# Patient Record
Sex: Female | Born: 1945
Health system: Southern US, Community
[De-identification: ages and names within clinical notes are randomized; demographics above are authoritative.]

## PROBLEM LIST (undated history)

## (undated) DIAGNOSIS — N390 Urinary tract infection, site not specified: Secondary | ICD-10-CM

## (undated) DIAGNOSIS — N281 Cyst of kidney, acquired: Secondary | ICD-10-CM

## (undated) DIAGNOSIS — M545 Low back pain, unspecified: Secondary | ICD-10-CM

## (undated) DIAGNOSIS — Z9889 Other specified postprocedural states: Secondary | ICD-10-CM

## (undated) DIAGNOSIS — I509 Heart failure, unspecified: Secondary | ICD-10-CM

## (undated) DIAGNOSIS — M1990 Unspecified osteoarthritis, unspecified site: Secondary | ICD-10-CM

## (undated) DIAGNOSIS — K859 Acute pancreatitis without necrosis or infection, unspecified: Secondary | ICD-10-CM

## (undated) DIAGNOSIS — E669 Obesity, unspecified: Secondary | ICD-10-CM

## (undated) DIAGNOSIS — I10 Essential (primary) hypertension: Secondary | ICD-10-CM

## (undated) DIAGNOSIS — N183 Chronic kidney disease, stage 3 unspecified: Secondary | ICD-10-CM

## (undated) DIAGNOSIS — R112 Nausea with vomiting, unspecified: Secondary | ICD-10-CM

## (undated) DIAGNOSIS — D638 Anemia in other chronic diseases classified elsewhere: Secondary | ICD-10-CM

## (undated) DIAGNOSIS — G629 Polyneuropathy, unspecified: Secondary | ICD-10-CM

## (undated) DIAGNOSIS — I3139 Other pericardial effusion (noninflammatory): Secondary | ICD-10-CM

## (undated) DIAGNOSIS — I272 Pulmonary hypertension, unspecified: Secondary | ICD-10-CM

## (undated) DIAGNOSIS — I313 Pericardial effusion (noninflammatory): Secondary | ICD-10-CM

## (undated) DIAGNOSIS — D126 Benign neoplasm of colon, unspecified: Secondary | ICD-10-CM

## (undated) DIAGNOSIS — I359 Nonrheumatic aortic valve disorder, unspecified: Secondary | ICD-10-CM

## (undated) DIAGNOSIS — K5730 Diverticulosis of large intestine without perforation or abscess without bleeding: Secondary | ICD-10-CM

## (undated) DIAGNOSIS — I872 Venous insufficiency (chronic) (peripheral): Secondary | ICD-10-CM

## (undated) HISTORY — PX: ABDOMINAL HYSTERECTOMY: SHX81

## (undated) HISTORY — DX: Polyneuropathy, unspecified: G62.9

## (undated) HISTORY — PX: COLONOSCOPY W/ BIOPSIES AND POLYPECTOMY: SHX1376

## (undated) HISTORY — DX: Low back pain: M54.5

## (undated) HISTORY — DX: Cyst of kidney, acquired: N28.1

## (undated) HISTORY — DX: Low back pain, unspecified: M54.50

## (undated) HISTORY — DX: Venous insufficiency (chronic) (peripheral): I87.2

## (undated) HISTORY — DX: Diverticulosis of large intestine without perforation or abscess without bleeding: K57.30

## (undated) HISTORY — DX: Nonrheumatic aortic valve disorder, unspecified: I35.9

## (undated) HISTORY — DX: Unspecified osteoarthritis, unspecified site: M19.90

## (undated) HISTORY — DX: Benign neoplasm of colon, unspecified: D12.6

## (undated) HISTORY — DX: Obesity, unspecified: E66.9

## (undated) HISTORY — DX: Pulmonary hypertension, unspecified: I27.20

## (undated) HISTORY — DX: Essential (primary) hypertension: I10

---

## 1998-06-06 ENCOUNTER — Ambulatory Visit (HOSPITAL_COMMUNITY): Admission: RE | Admit: 1998-06-06 | Discharge: 1998-06-06 | Payer: Self-pay | Admitting: Pulmonary Disease

## 1998-06-06 ENCOUNTER — Encounter: Payer: Self-pay | Admitting: Pulmonary Disease

## 1998-06-27 ENCOUNTER — Ambulatory Visit (HOSPITAL_COMMUNITY): Admission: RE | Admit: 1998-06-27 | Discharge: 1998-06-27 | Payer: Self-pay | Admitting: *Deleted

## 1999-02-20 ENCOUNTER — Ambulatory Visit (HOSPITAL_COMMUNITY): Admission: RE | Admit: 1999-02-20 | Discharge: 1999-02-20 | Payer: Self-pay | Admitting: *Deleted

## 2002-01-06 DIAGNOSIS — D126 Benign neoplasm of colon, unspecified: Secondary | ICD-10-CM

## 2002-01-06 HISTORY — DX: Benign neoplasm of colon, unspecified: D12.6

## 2002-03-16 ENCOUNTER — Other Ambulatory Visit: Admission: RE | Admit: 2002-03-16 | Discharge: 2002-03-16 | Payer: Self-pay | Admitting: Obstetrics & Gynecology

## 2003-09-06 ENCOUNTER — Other Ambulatory Visit: Admission: RE | Admit: 2003-09-06 | Discharge: 2003-09-06 | Payer: Self-pay | Admitting: Obstetrics & Gynecology

## 2004-02-07 ENCOUNTER — Ambulatory Visit: Payer: Self-pay | Admitting: Pulmonary Disease

## 2004-10-30 ENCOUNTER — Ambulatory Visit: Payer: Self-pay | Admitting: Pulmonary Disease

## 2004-11-06 ENCOUNTER — Ambulatory Visit: Payer: Self-pay | Admitting: Pulmonary Disease

## 2004-11-12 ENCOUNTER — Ambulatory Visit: Payer: Self-pay

## 2005-02-04 ENCOUNTER — Other Ambulatory Visit: Admission: RE | Admit: 2005-02-04 | Discharge: 2005-02-04 | Payer: Self-pay | Admitting: Obstetrics & Gynecology

## 2005-07-05 ENCOUNTER — Ambulatory Visit: Payer: Self-pay | Admitting: Pulmonary Disease

## 2005-07-09 ENCOUNTER — Ambulatory Visit: Payer: Self-pay | Admitting: Pulmonary Disease

## 2005-07-23 ENCOUNTER — Ambulatory Visit: Payer: Self-pay | Admitting: Pulmonary Disease

## 2005-08-27 ENCOUNTER — Ambulatory Visit: Payer: Self-pay | Admitting: Pulmonary Disease

## 2005-10-22 ENCOUNTER — Ambulatory Visit: Payer: Self-pay | Admitting: Pulmonary Disease

## 2006-01-28 ENCOUNTER — Ambulatory Visit: Payer: Self-pay | Admitting: Pulmonary Disease

## 2006-11-27 ENCOUNTER — Encounter: Admission: RE | Admit: 2006-11-27 | Discharge: 2006-11-27 | Payer: Self-pay | Admitting: Obstetrics & Gynecology

## 2006-12-16 ENCOUNTER — Ambulatory Visit: Payer: Self-pay | Admitting: Pulmonary Disease

## 2006-12-16 LAB — CONVERTED CEMR LAB
ALT: 25 units/L (ref 0–35)
AST: 25 units/L (ref 0–37)
Albumin: 3.5 g/dL (ref 3.5–5.2)
Alkaline Phosphatase: 90 units/L (ref 39–117)
BUN: 15 mg/dL (ref 6–23)
Basophils Absolute: 0.1 10*3/uL (ref 0.0–0.1)
Basophils Relative: 0.7 % (ref 0.0–1.0)
Bilirubin, Direct: 0.1 mg/dL (ref 0.0–0.3)
CO2: 34 meq/L — ABNORMAL HIGH (ref 19–32)
Calcium: 9.8 mg/dL (ref 8.4–10.5)
Chloride: 103 meq/L (ref 96–112)
Cholesterol: 160 mg/dL (ref 0–200)
Creatinine, Ser: 1 mg/dL (ref 0.4–1.2)
Eosinophils Absolute: 0.1 10*3/uL (ref 0.0–0.6)
Eosinophils Relative: 1.5 % (ref 0.0–5.0)
GFR calc Af Amer: 72 mL/min
GFR calc non Af Amer: 60 mL/min
Glucose, Bld: 92 mg/dL (ref 70–99)
HCT: 34.2 % — ABNORMAL LOW (ref 36.0–46.0)
HDL: 49.8 mg/dL (ref >39.0)
Hemoglobin: 11.8 g/dL — ABNORMAL LOW (ref 12.0–15.0)
LDL Cholesterol: 87 mg/dL (ref 0–99)
Lymphocytes Relative: 25.8 % (ref 12.0–46.0)
MCHC: 34.6 g/dL (ref 30.0–36.0)
MCV: 85 fL (ref 78.0–100.0)
Monocytes Absolute: 0.5 10*3/uL (ref 0.2–0.7)
Monocytes Relative: 6.7 % (ref 3.0–11.0)
Neutro Abs: 4.8 10*3/uL (ref 1.4–7.7)
Neutrophils Relative %: 65.3 % (ref 43.0–77.0)
Platelets: 272 10*3/uL (ref 150–400)
Potassium: 3.3 meq/L — ABNORMAL LOW (ref 3.5–5.1)
RBC: 4.02 M/uL (ref 3.87–5.11)
RDW: 14.9 % — ABNORMAL HIGH (ref 11.5–14.6)
Sodium: 143 meq/L (ref 135–145)
TSH: 1.01 microintl units/mL (ref 0.35–5.50)
Total Bilirubin: 0.6 mg/dL (ref 0.3–1.2)
Total CHOL/HDL Ratio: 3.2
Total Protein: 7.6 g/dL (ref 6.0–8.3)
Triglycerides: 114 mg/dL (ref 0–149)
VLDL: 23 mg/dL (ref 0–40)
WBC: 7.4 10*3/uL (ref 4.5–10.5)

## 2007-01-06 ENCOUNTER — Ambulatory Visit: Payer: Self-pay | Admitting: Gastroenterology

## 2007-01-20 ENCOUNTER — Encounter: Payer: Self-pay | Admitting: Gastroenterology

## 2007-01-20 ENCOUNTER — Ambulatory Visit: Payer: Self-pay | Admitting: Gastroenterology

## 2007-06-05 ENCOUNTER — Telehealth: Payer: Self-pay | Admitting: Pulmonary Disease

## 2007-12-21 ENCOUNTER — Encounter: Payer: Self-pay | Admitting: Pulmonary Disease

## 2008-01-11 ENCOUNTER — Encounter: Payer: Self-pay | Admitting: Pulmonary Disease

## 2008-02-23 ENCOUNTER — Telehealth: Payer: Self-pay | Admitting: Pulmonary Disease

## 2008-02-23 DIAGNOSIS — N281 Cyst of kidney, acquired: Secondary | ICD-10-CM | POA: Insufficient documentation

## 2008-02-23 DIAGNOSIS — K649 Unspecified hemorrhoids: Secondary | ICD-10-CM | POA: Insufficient documentation

## 2008-02-23 DIAGNOSIS — I10 Essential (primary) hypertension: Secondary | ICD-10-CM

## 2008-02-23 DIAGNOSIS — D126 Benign neoplasm of colon, unspecified: Secondary | ICD-10-CM

## 2008-02-23 DIAGNOSIS — M5410 Radiculopathy, site unspecified: Secondary | ICD-10-CM

## 2008-02-23 DIAGNOSIS — M542 Cervicalgia: Secondary | ICD-10-CM

## 2008-02-23 DIAGNOSIS — E669 Obesity, unspecified: Secondary | ICD-10-CM

## 2008-02-23 DIAGNOSIS — I872 Venous insufficiency (chronic) (peripheral): Secondary | ICD-10-CM

## 2008-04-13 ENCOUNTER — Ambulatory Visit: Payer: Self-pay | Admitting: Pulmonary Disease

## 2008-04-13 DIAGNOSIS — D649 Anemia, unspecified: Secondary | ICD-10-CM | POA: Insufficient documentation

## 2008-04-13 DIAGNOSIS — E559 Vitamin D deficiency, unspecified: Secondary | ICD-10-CM

## 2008-04-13 DIAGNOSIS — K5730 Diverticulosis of large intestine without perforation or abscess without bleeding: Secondary | ICD-10-CM | POA: Insufficient documentation

## 2008-04-14 ENCOUNTER — Encounter: Payer: Self-pay | Admitting: Pulmonary Disease

## 2008-04-17 LAB — CONVERTED CEMR LAB
ALT: 25 units/L (ref 0–35)
AST: 26 units/L (ref 0–37)
Albumin: 3.6 g/dL (ref 3.5–5.2)
BUN: 17 mg/dL (ref 6–23)
Basophils Absolute: 0.1 10*3/uL (ref 0.0–0.1)
Basophils Relative: 1.4 % (ref 0.0–3.0)
Bilirubin Urine: NEGATIVE
CO2: 30 meq/L (ref 19–32)
Chloride: 101 meq/L (ref 96–112)
Cholesterol: 152 mg/dL (ref 0–200)
Creatinine, Ser: 1.2 mg/dL (ref 0.4–1.2)
Glucose, Bld: 94 mg/dL (ref 70–99)
HCT: 35 % — ABNORMAL LOW (ref 36.0–46.0)
Hemoglobin, Urine: NEGATIVE
Hemoglobin: 12.1 g/dL (ref 12.0–15.0)
Ketones, ur: NEGATIVE mg/dL
LDL Cholesterol: 88 mg/dL (ref 0–99)
Lymphocytes Relative: 21.9 % (ref 12.0–46.0)
MCHC: 34.5 g/dL (ref 30.0–36.0)
Monocytes Absolute: 0.6 10*3/uL (ref 0.1–1.0)
Monocytes Relative: 7.9 % (ref 3.0–12.0)
Mucus, UA: NEGATIVE
Neutro Abs: 5.3 10*3/uL (ref 1.4–7.7)
RBC: 4.14 M/uL (ref 3.87–5.11)
RDW: 15.1 % — ABNORMAL HIGH (ref 11.5–14.6)
Sed Rate: 44 mm/hr — ABNORMAL HIGH (ref 0–22)
Total Protein, Urine: 30 mg/dL — AB
Total Protein: 7.7 g/dL (ref 6.0–8.3)
Triglycerides: 90 mg/dL (ref 0–149)
pH: 6.5 (ref 5.0–8.0)

## 2009-05-23 ENCOUNTER — Ambulatory Visit: Payer: Self-pay | Admitting: Pulmonary Disease

## 2009-05-25 LAB — CONVERTED CEMR LAB
Albumin: 3.7 g/dL (ref 3.5–5.2)
Alkaline Phosphatase: 100 units/L (ref 39–117)
Calcium: 10.1 mg/dL (ref 8.4–10.5)
Eosinophils Relative: 1.5 % (ref 0.0–5.0)
GFR calc non Af Amer: 53.1 mL/min (ref >60)
Glucose, Bld: 87 mg/dL (ref 70–99)
HCT: 36.4 % (ref 36.0–46.0)
HDL: 52.5 mg/dL (ref >39.00)
Hemoglobin: 11.9 g/dL — ABNORMAL LOW (ref 12.0–15.0)
LDL Cholesterol: 72 mg/dL (ref 0–99)
Lymphs Abs: 1.9 10*3/uL (ref 0.7–4.0)
Monocytes Relative: 10.9 % (ref 3.0–12.0)
Neutro Abs: 4.2 10*3/uL (ref 1.4–7.7)
RDW: 15.5 % — ABNORMAL HIGH (ref 11.5–14.6)
Sodium: 144 meq/L (ref 135–145)
Total Bilirubin: 0.4 mg/dL (ref 0.3–1.2)
Total CHOL/HDL Ratio: 3
VLDL: 20.4 mg/dL (ref 0.0–40.0)
Vit D, 25-Hydroxy: 21 ng/mL — ABNORMAL LOW (ref 30–89)
WBC: 7.1 10*3/uL (ref 4.5–10.5)

## 2009-10-03 ENCOUNTER — Telehealth (INDEPENDENT_AMBULATORY_CARE_PROVIDER_SITE_OTHER): Payer: Self-pay | Admitting: *Deleted

## 2009-11-21 ENCOUNTER — Ambulatory Visit: Payer: Self-pay | Admitting: Pulmonary Disease

## 2009-11-21 DIAGNOSIS — R609 Edema, unspecified: Secondary | ICD-10-CM

## 2009-12-26 ENCOUNTER — Encounter: Admission: RE | Admit: 2009-12-26 | Discharge: 2009-12-26 | Payer: Self-pay | Admitting: Obstetrics & Gynecology

## 2010-01-26 ENCOUNTER — Telehealth: Payer: Self-pay | Admitting: Pulmonary Disease

## 2010-02-09 ENCOUNTER — Telehealth (INDEPENDENT_AMBULATORY_CARE_PROVIDER_SITE_OTHER): Payer: Self-pay | Admitting: *Deleted

## 2010-03-26 ENCOUNTER — Ambulatory Visit: Payer: Self-pay | Admitting: Pulmonary Disease

## 2010-03-29 LAB — CONVERTED CEMR LAB
ALT: 33 units/L (ref 0–35)
BUN: 22 mg/dL (ref 6–23)
Basophils Relative: 0.4 % (ref 0.0–3.0)
Bilirubin Urine: NEGATIVE
CO2: 34 meq/L — ABNORMAL HIGH (ref 19–32)
Chloride: 101 meq/L (ref 96–112)
Creatinine, Ser: 1.4 mg/dL — ABNORMAL HIGH (ref 0.4–1.2)
Eosinophils Absolute: 0.1 10*3/uL (ref 0.0–0.7)
Eosinophils Relative: 0.9 % (ref 0.0–5.0)
Lymphocytes Relative: 25.6 % (ref 12.0–46.0)
Neutrophils Relative %: 63.2 % (ref 43.0–77.0)
Nitrite: NEGATIVE
RBC: 3.87 M/uL (ref 3.87–5.11)
Sed Rate: 42 mm/hr — ABNORMAL HIGH (ref 0–22)
Specific Gravity, Urine: >=1.03 (ref 1.000–1.030)
Total Protein: 7 g/dL (ref 6.0–8.3)
Urobilinogen, UA: 0.2 (ref 0.0–1.0)
WBC: 8.7 10*3/uL (ref 4.5–10.5)
pH: 5.5 (ref 5.0–8.0)

## 2010-04-03 ENCOUNTER — Telehealth (INDEPENDENT_AMBULATORY_CARE_PROVIDER_SITE_OTHER): Payer: Self-pay | Admitting: *Deleted

## 2010-04-29 ENCOUNTER — Encounter: Payer: Self-pay | Admitting: Obstetrics & Gynecology

## 2010-05-08 NOTE — Progress Notes (Signed)
Summary: rx   [id number]  Phone Note Call from Patient Call back at Home Phone [phone]   Caller: Patient Call For: nadel Reason for Call: Talk to Nurse Summary of Call: need her water pill called in to [place] - Demadex 20mg  90 day supply Initial call taken by: Zigmund Gottron,  October 03, 2009 10:39 AM  Follow-up for Phone Call        pt calling for RX for Demadex.  I don't see this med anywhere on her med list.  [id number].  [name] LPN  June 28, 624THL [id number] AM   called and spoke with pt.  pt recently saw SN on 05-23-2009.  Pt states she needs refil on Demadex 20mg .  Pt states she takes 2 tabs each morning.  I informed pt I do not see this on her current med list.  Pt states she has been taking this "for awhile and Dr. Jeannine Kitten name is on the bottle" as who prescribed this for her. Pt requesting refill.  Please advise if ok or not.  Thanks. [name] LPN  June 28, 624THL [id number] AM   Additional Follow-up for Phone Call Additional follow up Details #1::        per SN---ok to refill the demadex 20mg    #60  take 2 tablets by mouth every morning   refill x 11.  thanks [place]  October 03, 2009 2:49 PM     Additional Follow-up for Phone Call Additional follow up Details #2::    Rx refill sent to [facility] for pt to be aware this was done. Follow-up by: Tilden Dome,  October 03, 2009 3:00 PM  New/Updated Medications: DEMADEX 20 MG TABS (TORSEMIDE) 2 every am Prescriptions: DEMADEX 20 MG TABS (TORSEMIDE) 2 every am  #180 x 3   Entered by:   Tilden Dome   Authorized by:   Noralee Space MD   Signed by:   Tilden Dome on 10/03/2009   Method used:   Faxed to ...       [facility] (mail-order)             , [place]         Ph: [phone]       Fax: [phone]   RxID:   [id number]

## 2010-05-08 NOTE — Progress Notes (Signed)
Summary: needs appt w/ sn in dec w/ spouse - 12.19.11  Phone Note Call from Patient Call back at Home Phone [phone]   Caller: Patient Call For: nadel Summary of Call: pt requests to be seen for her 6 month f/u in dec of this yr because her husband lost his job and her ins will be gone after dec. she currently has an appt pend for 05/23/10. she also requests that her husband- Juanda Crumble- also a pt of sn- have a f/u at the same time in dec as well. (separate msg is on him as well).  Initial call taken by: Cooper Render, CNA,  February 09, 2010 9:27 AM  Follow-up for Phone Call        [facility] TCB x1.  Marliss Czar, is there a place in Dec for this couple to be worked in?  I looked and SN seems pretty booked. Parke Poisson CNA/MA  February 09, 2010 9:40 AM   Additional Follow-up for Phone Call Additional follow up Details #1::        there are a couple of days that i can fit one in but not both on the same day.   Elita Boone CMA  February 09, 2010 9:42 AM   Returning call.Netta Neat  February 09, 2010 9:46 AM  LMOM TCBx2. Parke Poisson CNA/MA  February 09, 2010 9:52 AM     Additional Follow-up for Phone Call Additional follow up Details #2::    pt returned call.  she states that her husbands job will be terminated as of 12.30.11 (she is on his insurance) and would like to be worked in before her 2.2012 appt.  states any afternoon will fine.  at last ov in august, pt was told will need 6 month fasting blood work.  will she need this done at ov in dec?  will not qualify for medicare until birthday in july.  please advise, thanks!  okay to LM w/ appt dates/times. Parke Poisson CNA/MA  February 09, 2010 10:37 AM     see pts spouse phone note for any date to use. thanks [place]  February 09, 2010 10:39 AM   Additional Follow-up for Phone Call Additional follow up Details #3:: Details for Additional Follow-up Action Taken: pt scheduled for appt w/ SN 12.19.11 @ 3pm.  LMOM  notifying of appt. Parke Poisson CNA/MA  February 09, 2010 11:03 AM

## 2010-05-08 NOTE — Assessment & Plan Note (Signed)
Summary: 6 months/apc   CC:  6 month ROV & review of mult medical problems....  History of Present Illness: 65 y/o BF here for a follow up visit... she has multiple medical problems as noted below, and a hx of poor compliance w/ med Rx and office follow-ups...    ~  Jan10:  she states that she has been taking her meds regularly but ran out of her [place]- ?when? we cannot confirm since she gets meds from [facility] and [facility] [address]...   ~  May 23, 2009:  she states that she has been stable over the past yr- feeling OK w/o new complaints or concerns... she says she's been taking her meds regularly but is out of diovan & Demadex- neededing refills of all meds today but asking to change the Diovan since co-pay jumped to tier3...  her weight is up 5# to 233# today & she has 2+edema, not dieting, not restricting sodium, etc... she is limited by her arthritis which is bone-on-bone in the knees but she is holding off on any ortho procedures....   ~  November 21, 2009:  106mo ROV doing reasonably well but notes some sinus pressure & hemorrhoid problems (we discussed Mucinex for the former & [place] for the latter)... her weight is unchanged at 233# & it is apparent that she is not dieting or exercising... she is followed by Shara Blazing for Ortho w/ end stage OA of knees but she is holding off on TKR so far... BP controlled on meds but still w/ severe VI & pedal edema despite Demadex 2/d> reminded about no salt, elevation, support hose.    Current Problems:   HYPERTENSION (ICD-401.9) - on LABETOLOL 200mg - 2TabsBid,  NORVASC 10mg /d,  LOSARTAN 100mg /d,  DEMEDEX 20mg - 2tabs/d,  KCl 28mEqBid... BP today= 130/76 & tol meds well- denies CP, palpit, change in SOB/ edema/ etc... hx severe HBP in the past w/ eval from Nephrology- DrGarber in 2000 w/ neg MRA Abd- no RAS, +tort AbdAo.Marland KitchenMarland KitchenNOTE: renal function normal,  cholesterol normal... INTOL to Minoxidil in past.  ~  2DEcho 1999 showed mild LVH, EF=  60%, mild MV thickening & MR, AoV sclerosis...  ~  2DEcho 2006 showed mod concentric LVH, AoV sclerosis w/ mild AI, thickened MV w/ mild MR.  ~  baseline CXR w/ cardiomegaly, chr bronchitic changes, prom R superior mediast soft tissue w/o ch.  ~  baseline EKG w/ NSR, LAD, incr voltage, late transition...  ~  2/11: she requested change from Diovan to generic> Losartan 100mg /d.  VENOUS INSUFFICIENCY (ICD-459.81) & EDEMA - she is supposed to follow a low sodium diet, elevate legs, & wears support hose when able... continue Demadex 20mg - 2Qam.  OBESITY (ICD-278.00) - she has been counselled on a low carb, low fat, weight reducing diet...  ~  weight 1/10 = 228# which is her peak and up 10# in the last yr...  ~  weight 2/11 = 233#  ~  weight 8/11 = 233#...  reminded of diet + exercise needed.  DIVERTICULOSIS OF COLON (ICD-562.10),  COLONIC POLYPS (ICD-211.3),  HEMORRHOIDS (ICD-455.6) - last colonoscopy 10/08 by DrStark showed divertics, hems, & several 3-75mm polyps= adenomatous w/ f/u planned in 5 yrs.  ~  2/11:  she notes some constipation & is rec to take Miralax & Senakot-S.  ~  8/11:  [place] written for hems...  RENAL CYST (ICD-593.2) - incidentally noted left lower pole simple renal cyst...  DEGENERATIVE JOINT DISEASE (ICD-715.90) - known bilat knee arthritis  w/ eval by Shara Blazing in 2003... bone-on-bone w/ cortisone shots attempted in 2005, and Synvisc in 2006... she takes [facility] 7.5mg  Prn & TRAMADOL 50mg  Prn...  BACK PAIN, LUMBAR (ICD-724.2) - she has known DDD...  ~  Shara Blazing tried Lyrica w/ some improvement in symptoms.  VITAMIN D DEFICIENCY (ICD-268.9) - Vit D level 9/08 = 5... started on Vit D 50,000 u weekly, but she stopped this on her own after several months and switched to Vit D 1000 u daily...  ~  labs 1/10 showed Vit D level = 22... rec to incr OTC Vit D to 2000 u daily (never did).  ~  labs 2/11 showed Vit D level = 21... rec to get on the 2000 u daily!  NEUROPATHY  (ICD-355.9)  Hx of ANEMIA (ICD-285.9) - hx anemia from heavy menses before her hysterectomy in 1989... subseq mild anemia- multifactorial in nature...  ~  labs 9/08 showed Hg= 11.8  ~  labs 1/10 showed Hg= 12.1  ~  labs 2/11 showed Hg= 11.9  Health Maintenance - she is supposed to take ASA 81mg /d,  Calcium Bid,  MVI daily, & Vit D 2000 u daily... she refuses Flu shots.   Preventive Screening-Counseling & Management  Alcohol-Tobacco     Smoking Status: quit     Year Quit: 1981  Allergies: 1)  Naprosyn (Naproxen)  Comments:  Nurse/Medical Assistant: The patient's medications and allergies were reviewed with the patient and were updated in the Medication and Allergy Lists.  Past History:  Past Medical History: HYPERTENSION (ICD-401.9) VENOUS INSUFFICIENCY (ICD-459.81) OBESITY (ICD-278.00) DIVERTICULOSIS OF COLON (ICD-562.10) COLONIC POLYPS (ICD-211.3) HEMORRHOIDS (ICD-455.6) RENAL CYST (ICD-593.2) DEGENERATIVE JOINT DISEASE (ICD-715.90) BACK PAIN, LUMBAR (ICD-724.2) VITAMIN D DEFICIENCY (ICD-268.9) NEUROPATHY (ICD-355.9) Hx of ANEMIA (ICD-285.9)  Past Surgical History: S/P TAH & right oopherectomy in 1989 by DrNeal for fibroids & cyst  Family History: Reviewed history from 05/23/2009 and no changes required. Father ?alive, she doesn't know him... Mother alive, age 64 w/ DM, HBP, still works every day! 2 Siblings:  1 Bro w/ hx HBP, stroke, DM... 1 Sis, Isla Pence, w/ HBP  Social History: Reviewed history from 05/23/2009 and no changes required. Married, husb= Ono, 17yrs... 1 Child, son age 69 ex-smoker, smoked 67yr, quit 30 yrs ago. social alcohol retired from [employer]... Smoking Status:  quit  Review of Systems      See HPI       The patient complains of dyspnea on exertion, peripheral edema, and difficulty walking.  The patient denies anorexia, fever, weight loss, weight gain, vision loss, decreased hearing, hoarseness, chest pain, syncope,  prolonged cough, headaches, hemoptysis, abdominal pain, melena, hematochezia, severe indigestion/heartburn, hematuria, incontinence, muscle weakness, suspicious skin lesions, transient blindness, depression, unusual weight change, abnormal bleeding, enlarged lymph nodes, and angioedema.    Vital Signs:  Patient profile:   65 year old female Height:      66 inches Weight:      233.13 pounds BMI:     37.76 O2 Sat:      98 % on Room air Temp:     97.4 degrees F oral Pulse rate:   73 / minute BP sitting:   130 / 76  (left arm) Cuff size:   large  Vitals Entered By: Elita Boone CMA (November 21, 2009 2:07 PM)  O2 Sat at Rest %:  98 O2 Flow:  Room air CC: 6 month ROV & review of mult medical problems... Is Patient Diabetic? No Pain Assessment Patient in pain? yes  Onset of pain  sinus pressure Comments no changes in meds today   Physical Exam  Additional Exam:  WD, Overweight, 65 y/o BF in NAD... GENERAL:  Alert & oriented; pleasant & cooperative... HEENT:  [place]/AT, EOM-wnl, PERRLA, EACs-clear, TMs-wnl, NOSE-clear, THROAT-clear & wnl. NECK:  Supple w/ fairROM; no JVD; normal carotid impulses w/o bruits; palp thyroid w/o nodules; no lymphadenopathy. CHEST:  Clear to P & A; without wheezes/ rales/ or rhonchi heard... HEART:  Regular Rhythm; without murmurs/ rubs/ or gallops detected... ABDOMEN:  Obese, soft & nontender; normal bowel sounds; no organomegaly or masses detected. EXT:  mod-severe arthritic changes; no varicose veins/ +venous insuffic/ 2+edema. NEURO:  CN's intact; motor testing normal; sensory testing normal; gait abn due to severe arthritis. DERM:  No lesions noted; no rash etc...    Impression & Recommendations:  Problem # 1:  HYPERTENSION (ICD-401.9) Controlled on meds>  continue same, no salt, get wt down!!! Her updated medication list for this problem includes:    Labetalol Hcl 200 Mg Tabs (Labetalol hcl) .Marland Kitchen... Take 2 tablets by mouth two times a day     Norvasc 10 Mg Tabs (Amlodipine besylate) .Marland Kitchen... Take 1 tablet by mouth once a day    Losartan Potassium 100 Mg Tabs (Losartan potassium) .Marland Kitchen... Take 1 tab by mouth once daily...    Demadex 20 Mg Tabs (Torsemide) .Marland Kitchen... Take 2 tabs by mouth once daily...  Problem # 2:  VENOUS INSUFFICIENCY (ICD-459.81) We discussed low sodium, elevate legs, support hose, continue Demadex...  Problem # 3:  OBESITY (ICD-278.00) Weight reduction is key... discussed diet + exercise.  Problem # 4:  COLONIC POLYPS (ICD-211.3) GI reviewed w/ pt... OK AnusolHC for [place]...  Problem # 5:  DEGENERATIVE JOINT DISEASE (ICD-715.90) Severe dis>  OK Mobic + Tramadol, but needs TKR & she will f/u w/ DrNitka... Her updated medication list for this problem includes:    Aspirin Adult Low Strength 81 Mg Tbec (Aspirin) .Marland Kitchen... Take 1 tablet by mouth once a day    Meloxicam 7.5 Mg Tabs (Meloxicam) .Marland Kitchen... Take 1 tab by mouth once daily as needed for arthritis...    Tramadol Hcl 50 Mg Tabs (Tramadol hcl) .Marland Kitchen... Take 1 tab by mouth every 6 h as needed for pain...  Problem # 6:  OTHER MEDICAL PROBLEMS AS NOTED>>>  Complete Medication List: 1)  Aspirin Adult Low Strength 81 Mg Tbec (Aspirin) .... Take 1 tablet by mouth once a day 2)  Labetalol Hcl 200 Mg Tabs (Labetalol hcl) .... Take 2 tablets by mouth two times a day 3)  Norvasc 10 Mg Tabs (Amlodipine besylate) .... Take 1 tablet by mouth once a day 4)  Losartan Potassium 100 Mg Tabs (Losartan potassium) .... Take 1 tab by mouth once daily.Marland KitchenMarland Kitchen 5)  Demadex 20 Mg Tabs (Torsemide) .... Take 2 tabs by mouth once daily.Marland KitchenMarland Kitchen 6)  Klor-con M20 20 Meq Cr-tabs (Potassium chloride crys cr) .... Take one tablet by mouth two times a day 7)  Meloxicam 7.5 Mg Tabs (Meloxicam) .... Take 1 tab by mouth once daily as needed for arthritis.Marland KitchenMarland Kitchen 8)  Tramadol Hcl 50 Mg Tabs (Tramadol hcl) .... Take 1 tab by mouth every 6 h as needed for pain.Marland KitchenMarland Kitchen 9)  Multivitamins Tabs (Multiple vitamin) .... Take 1 tablet by  mouth once a day 10)  Womens Multivitamin Plus Tabs (Multiple vitamins-minerals) .... Take 1 tab daily... 11)  Vitamin D 1000 Unit Tabs (Cholecalciferol) .... Take 2 caps daily... 12)  Proctocare-hc 2.5 % Crea (Hydrocortisone) .... Apply  as directed after each bowel movement...  Patient Instructions: 1)  Today we updated your med list- see below.... 2)  Continue your current meds the same... 3)  We wrote a new perscription for a hemorroidal cream to apply after each BM.Marland KitchenMarland Kitchen 4)  Let's get on track w/ our diet + exercise program... the goal is to lose 15-20 lbs!!! 5)  Call for any questions.Marland KitchenMarland Kitchen 6)  Please schedule a follow-up appointment in 6 months, with FASTING blood work... Prescriptions: PROCTOCARE-HC 2.5 % CREA (HYDROCORTISONE) apply as directed after each bowel movement...  #1 large tube x prn   Entered and Authorized by:   Noralee Space MD   Signed by:   Noralee Space MD on 11/21/2009   Method used:   Print then Give to Patient   RxID:   [id number]

## 2010-05-08 NOTE — Assessment & Plan Note (Signed)
Summary: rov- ok per leigh///kp   CC:  13 month ROV & review of mult medical problems....  History of Present Illness: 65 y/o BF here for a follow up visit... she has multiple medical problems as noted below, and a hx of poor compliance w/ med Rx and office follow-ups...    ~  Jan10:  she states that she has been taking her meds regularly but ran out of her Vina- ?when? we cannot confirm since she gets meds from [facility] and [facility] [place]...   ~  May 23, 2009:  she states that she has been stable over the past yr- feeling OK w/o new complaints or concerns... she says she's been taking her meds regularly but is out of diovan & Demadex- neededing refills of all meds today but asking to change the Diovan since co-pay jumped to tier3...  her weight is up 5# to 233# today & she has 2+edema, not dieting, not restricting sodium, etc... she is limited by her arthritis which is bone-on-bone in the knees but she is holding off on any ortho procedures....    Current Problems:   HYPERTENSION (ICD-401.9) - on LABETOLOL 200mg - 2TabsBid,  NORVASC 10mg /d,  DIOVAN 320mg /d,  DEMEDEX 20mg - 2tabs/d,  KCl 26mEqBid... hx severe HBP in the past w/ eval from Nephrology- DrGarber in 2000 w/ neg MRA Abd- no RAS, +tort AbdAo.Marland KitchenMarland KitchenNOTE: renal function normal,  cholesterol normal... INTOL to Minoxidil in past.  ~  2DEcho 1999 showed mild LVH, EF= 60%, mild MV thickening & MR, AoV sclerosis...  ~  2DEcho 2006 showed mod concentric LVH, AoV sclerosis w/ mild AI, thickened MV w/ mild MR.  ~  baseline CXR w/ cardiomegaly, chr bronchitic changes, prom R superior mediast soft tissue w/o ch.  ~  baseline EKG w/ NSR, LAD, incr voltage, late transition...  ~  f/u CXR 2/11 showed   ~  2/11: she requested change from Diovan to generic LOSARTAN 100mg /d.  VENOUS INSUFFICIENCY (ICD-459.81) - she is supposed to follow a low sodium diet, elevates legs, & wears support hose when able...  OBESITY (ICD-278.00) - she  has been counselled on a low carb, low fat, weight reducing diet...  ~  weight 1/10 = 228# which is her peak and up 10# in the last yr...  ~  weight 2/11 = 233#  DIVERTICULOSIS OF COLON (ICD-562.10),  COLONIC POLYPS (ICD-211.3),  HEMORRHOIDS (ICD-455.6) - last colonoscopy 10/08 by DrStark showed divertics, hems, & several 3-35mm polyps= adenomatous w/ f/u planned in 5 yrs.  ~  2/11:  she notes some constipation & is rec to take [place].  RENAL CYST (ICD-593.2) - incidentally noted left lower pole simple renal cyst...  DEGENERATIVE JOINT DISEASE (ICD-715.90) - known bilat knee arthritis w/ eval by DrNitka in 2003... bone-on-bone w/ cortisone shots attempted in 2005, and Synvisc in 2006... she takes MOBIC 7.5mg  Prn & TRAMADOL 50mg  Prn...  BACK PAIN, LUMBAR (ICD-724.2) - she has known DDD...  VITAMIN D DEFICIENCY (ICD-268.9) - Vit D level 9/08 = 5... started on Vit D 50,000 u weekly, but she stopped this on her own after several months and switched to Vit D 1000 u daily...  ~  labs 1/10 showed Vit D level = 22... rec to incr OTC Vit D to 2000 u daily (never did).  ~  labs 2/11 showed Vit D level = 21... rec to get on the 2000 u daily!  NEUROPATHY (ICD-355.9)  Hx of ANEMIA (ICD-285.9) - hx anemia from  heavy menses before her hysterectomy in 1989... subseq mild anemia- multifactorial in nature...  ~  labs 9/08 showed Hg= 11.8  ~  labs 1/10 showed Hg= 12.1  ~  labs 2/11 showed Hg= 11.9  Health Maintenance - she is supposed to take ASA 81mg /d,  Calcium Bid,  MVI daily, & Vit D 2000 u daily... she refuses Flu shots.   Allergies: 1)  Naprosyn (Naproxen)  Comments:  Nurse/Medical Assistant: The patient's medications and allergies were reviewed with the patient and were updated in the Medication and Allergy Lists.  Past History:  Past Medical History:  HYPERTENSION (ICD-401.9) VENOUS INSUFFICIENCY (ICD-459.81) OBESITY (ICD-278.00) DIVERTICULOSIS OF COLON  (ICD-562.10) COLONIC POLYPS (ICD-211.3) HEMORRHOIDS (ICD-455.6) RENAL CYST (ICD-593.2) DEGENERATIVE JOINT DISEASE (ICD-715.90) BACK PAIN, LUMBAR (ICD-724.2) VITAMIN D DEFICIENCY (ICD-268.9) NEUROPATHY (ICD-355.9) Hx of ANEMIA (ICD-285.9)  Past Surgical History: S/P TAH & right oopherectomy in 1989 by DrNeal for fibroids & cyst  Family History: Father ?alive, she doesn't know him... Mother alive, age 58 w/ DM, HBP, still works every day! 2 Siblings:  1 Bro w/ hx HBP, stroke, DM... 1 Sis, Isla Pence, w/ HBP  Social History: Married, husb= [place], 32yrs... 1 Child, son age 3 ex-smoker, smoked 77yr, quit 30 yrs ago. social alcohol retired from [employer], [employer]...  Review of Systems      See HPI       The patient complains of weight gain, dyspnea on exertion, peripheral edema, and difficulty walking.  The patient denies anorexia, fever, weight loss, vision loss, decreased hearing, hoarseness, chest pain, syncope, prolonged cough, headaches, hemoptysis, abdominal pain, melena, hematochezia, severe indigestion/heartburn, hematuria, incontinence, muscle weakness, suspicious skin lesions, transient blindness, depression, unusual weight change, abnormal bleeding, enlarged lymph nodes, and angioedema.    Vital Signs:  Patient profile:   65 year old female Height:      66 inches Weight:      233 pounds BMI:     37.74 O2 Sat:      94 % on Room air Temp:     96.9 degrees F oral Pulse rate:   77 / minute BP sitting:   144 / 82  (left arm) Cuff size:   regular  Vitals Entered By: Elita Boone CMA (May 23, 2009 2:58 PM)  O2 Sat at Rest %:  94 O2 Flow:  Room air CC: 13 month ROV & review of mult medical problems... Is Patient Diabetic? No Pain Assessment Patient in pain? no      Comments no changes in meds today   Physical Exam  Additional Exam:  WD, Overweight, 65 y/o BF in NAD... GENERAL:  Alert & oriented; pleasant & cooperative... HEENT:  [place]/AT, EOM-wnl,  PERRLA, EACs-clear, TMs-wnl, NOSE-clear, THROAT-clear & wnl. NECK:  Supple w/ fairROM; no JVD; normal carotid impulses w/o bruits; palp thyroid w/o nodules; no lymphadenopathy. CHEST:  Clear to P & A; without wheezes/ rales/ or rhonchi heard... HEART:  Regular Rhythm; without murmurs/ rubs/ or gallops detected... ABDOMEN:  Obese, soft & nontender; normal bowel sounds; no organomegaly or masses detected. EXT: without deformities, mod arthritic changes; no varicose veins/ +venous insuffic/ 2+edema. NEURO:  CN's intact; motor testing normal; sensory testing normal; gait abn due to severe arthritis. DERM:  No lesions noted; no rash etc...    MISC. Report  Procedure date:  05/23/2009  Findings:      Lipid Panel (LIPID)   Cholesterol               145 mg/dL  0-200   Triglycerides             102.0 mg/dL                 0.0-149.0   HDL                       52.50 mg/dL                 >39.00   LDL Cholesterol           72 mg/dL                    0-99  BMP (METABOL)   Sodium                    144 mEq/L                   135-145   Potassium                 3.7 mEq/L                   3.5-5.1   Chloride                  105 mEq/L                   96-112   Carbon Dioxide       [H]  35 mEq/L                    19-32   Glucose                   87 mg/dL                    70-99   BUN                       18 mg/dL                    6-23   Creatinine           [H]  1.3 mg/dL                   0.4-1.2   Calcium                   10.1 mg/dL                  8.4-10.5   GFR                       53.10 mL/min                >60  Hepatic/Liver Function Panel (HEPATIC)   Total Bilirubin           0.4 mg/dL                   0.3-1.2   Direct Bilirubin          0.1 mg/dL                   0.0-0.3   Alkaline Phosphatase      100 U/L                     39-117   AST  25 U/L                      0-37   ALT                       31 U/L                      0-35    Total Protein             7.6 g/dL                    6.0-8.3   Albumin                   3.7 g/dL                    3.5-5.2  Comments:      CBC Platelet w/Diff (CBCD)   White Cell Count          7.1 K/uL                    4.5-10.5   Red Cell Count            4.18 Mil/uL                 3.87-5.11   Hemoglobin           [L]  11.9 g/dL                   12.0-15.0   Hematocrit                36.4 %                      36.0-46.0   MCV                       87.1 fl                     78.0-100.0   Platelet Count            242.0 K/uL                  150.0-400.0   Neutrophil %              60.5 %                      43.0-77.0   Lymphocyte %              26.3 %                      12.0-46.0   Monocyte %                10.9 %                      3.0-12.0   Eosinophils%              1.5 %                       0.0-5.0   Basophils %               0.8 %  0.0-3.0  TSH (TSH)   FastTSH                   1.01 uIU/mL                 0.35-5.50  Vitamin D (25-Hydroxy) [id number])  Vitamin D (25-Hydroxy)                        [L]  21 ng/mL                    30-89   Impression & Recommendations:  Problem # 1:  HYPERTENSION (ICD-401.9) BP controlled on 4 meds... she wants to switch the Diovan to LOSARTAN100mg ... The following medications were removed from the medication list:    Diovan 320 Mg Tabs (Valsartan) .Marland Kitchen... Take 1 tablet by mouth once a day Her updated medication list for this problem includes:    Labetalol Hcl 200 Mg Tabs (Labetalol hcl) .Marland Kitchen... Take 2 tablets by mouth two times a day    Norvasc 10 Mg Tabs (Amlodipine besylate) .Marland Kitchen... Take 1 tablet by mouth once a day    Losartan Potassium 100 Mg Tabs (Losartan potassium) .Marland Kitchen... Take 1 tab by mouth once daily...  Orders: TLB-Lipid Panel (80061-LIPID) TLB-BMP (Basic Metabolic Panel-BMET) (99991111) TLB-Hepatic/Liver Function Pnl (80076-HEPATIC) TLB-CBC Platelet - w/Differential (85025-CBCD) TLB-TSH (Thyroid  Stimulating Hormone) (84443-TSH) T-Vitamin D (25-Hydroxy) [id number])  Problem # 2:  VENOUS INSUFFICIENCY (ICD-459.81) She is not restricting sodium & has 2+edema despite Demadex 2/d... we discussed diet adjust w/ 2gm Na+ restriction...  Problem # 3:  OBESITY (ICD-278.00) Diet & exercise are the keys to success...  Problem # 4:  COLONIC POLYPS (ICD-211.3) she is up to date on GI f/u... [place] eMIRALAX & SENAKOT-S for constipation...  Problem # 5:  DEGENERATIVE JOINT DISEASE (ICD-715.90) She has severe DJD on Mobic & Tramadol... she was asking about Vicodin today... she hasn't been to Ortho in some time but doesnt want shots or surg... Her updated medication list for this problem includes:    Aspirin Adult Low Strength 81 Mg Tbec (Aspirin) .Marland Kitchen... Take 1 tablet by mouth once a day    Meloxicam 7.5 Mg Tabs (Meloxicam) .Marland Kitchen... Take 1 tab by mouth once daily as needed for arthritis...    Tramadol Hcl 50 Mg Tabs (Tramadol hcl) .Marland Kitchen... Take 1 tab by mouth every 6 h as needed for pain...  Problem # 6:  VITAMIN D DEFICIENCY (ICD-268.9) Needs to start the Vit D 2000 u daily...  Problem # 7:  OTHER MEDICAL PROBLEMS AS NOTED>>>  Complete Medication List: 1)  Aspirin Adult Low Strength 81 Mg Tbec (Aspirin) .... Take 1 tablet by mouth once a day 2)  Labetalol Hcl 200 Mg Tabs (Labetalol hcl) .... Take 2 tablets by mouth two times a day 3)  Norvasc 10 Mg Tabs (Amlodipine besylate) .... Take 1 tablet by mouth once a day 4)  Losartan Potassium 100 Mg Tabs (Losartan potassium) .... Take 1 tab by mouth once daily.Marland KitchenMarland Kitchen 5)  Klor-con M20 20 Meq Cr-tabs (Potassium chloride crys cr) .... Take one tablet by mouth two times a day 6)  Meloxicam 7.5 Mg Tabs (Meloxicam) .... Take 1 tab by mouth once daily as needed for arthritis.Marland KitchenMarland Kitchen 7)  Tramadol Hcl 50 Mg Tabs (Tramadol hcl) .... Take 1 tab by mouth every 6 h as needed for pain.Marland KitchenMarland Kitchen 8)  Multivitamins Tabs (Multiple vitamin) .... Take 1 tablet by mouth once a day 9)  Womens  Multivitamin Plus Tabs (Multiple vitamins-minerals) .... Take 1 tab daily... 10)  Vitamin D 1000 Unit Tabs (Cholecalciferol) .... Take 2 caps daily...  Other Orders: Prescription Created Electronically [phone])  Patient Instructions: 1)  Today we updated your med list- see below.... 2)  We refilled your meds for 90 d supplies as requested... 3)  Today we did your follow up FASTING blood work... please call the "phone tree" in a few days for your lab results.Marland KitchenMarland Kitchen 4)  [place], you need to get your weight down>>> try weight watchers or similar program... and you need to eliminate all salt from your diet-  see diet hand out... 5)  Call for any questions.Marland KitchenMarland Kitchen 6)  Please schedule a follow-up appointment in 6 months. Prescriptions: MELOXICAM 7.5 MG TABS (MELOXICAM) take 1 tab by mouth once daily as needed for arthritis...  #90 x 4   Entered and Authorized by:   Noralee Space MD   Signed by:   Noralee Space MD on 05/23/2009   Method used:   Print then Give to Patient   RxID:   [id number] TRAMADOL HCL 50 MG TABS (TRAMADOL HCL) take 1 tab by mouth every 6 H as needed for pain...  #100 x prn   Entered and Authorized by:   Noralee Space MD   Signed by:   Noralee Space MD on 05/23/2009   Method used:   Print then Give to Patient   RxID:   [id number] KLOR-CON M20 20 MEQ CR-TABS (POTASSIUM CHLORIDE CRYS CR) take one tablet by mouth two times a day  #180 x 4   Entered and Authorized by:   Noralee Space MD   Signed by:   Noralee Space MD on 05/23/2009   Method used:   Print then Give to Patient   RxID:   [id number] NORVASC 10 MG TABS (AMLODIPINE BESYLATE) Take 1 tablet by mouth once a day  #90 x 4   Entered and Authorized by:   Noralee Space MD   Signed by:   Noralee Space MD on 05/23/2009   Method used:   Print then Give to Patient   RxID:   [id number] LABETALOL HCL 200 MG TABS (LABETALOL HCL) take 2 tablets by mouth two times a day  #360 x 4   Entered and Authorized by:   Noralee Space MD   Signed by:   Noralee Space MD on 05/23/2009   Method used:   Print then Give to Patient   RxID:   [id number] POTASSIUM 100 MG TABS (LOSARTAN POTASSIUM) take 1 tab by mouth once daily...  #90 x 4   Entered and Authorized by:   Noralee Space MD   Signed by:   Noralee Space MD on 05/23/2009   Method used:   Print then Give to Patient   RxID:   [id number]

## 2010-05-08 NOTE — Progress Notes (Signed)
Summary: medication  Phone Note From Pharmacy Call back at [id number]   Caller: Patient Caller: [phone] ( soumangkone Call For: Danyl Deems  Summary of Call: need alternative for labetalol 200mg  ref # [id number] Initial call taken by: Gustavus Bryant,  January 26, 2010 10:47 AM  Follow-up for Phone Call        per [facility] labetalol is on manuf. back order with a unreleased date of when it might be available, need alternative med for pt--pls advise Follow-up by: [place],  January 26, 2010 11:05 AM  Additional Follow-up for Phone Call Additional follow up Details #1::        per SN----pt is on the labetalol hcl 200mg ----they have this med at the local pharmacy---please send this in to her local pharmacy here with refills. thanks [place]  January 26, 2010 2:29 PM   LMTCBx1 with the pt to advise that labetalol will be sent to local pharmacy. ALso need to verify pharmacy. [place] Bing CMA  January 26, 2010 2:50 PM     Additional Follow-up for Phone Call Additional follow up Details #2::    spoke with pt and advised that [facility] did not have medication. Pt ok to send 30 day supply to local pharmacy. she states she will check with [facility] in a month to see if they have med in. Rx sent.[place] Bing CMA  January 26, 2010 4:17 PM   Prescriptions: LABETALOL HCL 200 MG TABS (LABETALOL HCL) take 2 tablets by mouth two times a day  #120 x 0   Entered by:   [place] Bing CMA   Authorized by:   Noralee Space MD   Signed by:   [place] Bing CMA on 01/26/2010   Method used:   Electronically to        [facility] [phone]* (retail)       [place], [place]  [id number]       Ph: [phone] or [phone]       Fax: [phone]   RxID:   [id number]

## 2010-05-10 NOTE — Assessment & Plan Note (Signed)
Summary: rov/mhh   CC:  4 month ROV & review of mult medical problems....  History of Present Illness: 65 y/o BF here for a follow up visit... she has multiple medical problems as noted below, and a hx of poor compliance w/ med Rx and office follow-ups...    ~  May 23, 2009:  she states that she has been stable over the past yr- feeling OK w/o new complaints or concerns... she says she's been taking her meds regularly but is out of diovan & Demadex- needing refills of all meds today but asking to change the Diovan since co-pay jumped to tier3...  her weight is up 5# to 233# today & she has 2+edema, not dieting, not restricting sodium, etc... she is limited by her arthritis which is bone-on-bone in the knees but she is holding off on any ortho procedures....   ~  November 21, 2009:  48mo ROV doing reasonably well but notes some sinus pressure & hemorrhoid problems (we discussed Mucinex for the former & [place] for the latter)... her weight is unchanged at 233# & it is apparent that she is not dieting or exercising... she is followed by Shara Blazing for Ortho w/ end stage OA of knees but she is holding off on TKR so far... BP controlled on meds but still w/ severe VI & pedal edema despite Demadex 2/d> reminded about no salt, elevation, support hose.   ~  March 26, 2010:  c/o decr energy recently- weight 236# is up 3#, still not dieting or exercising although she has knee braces to help w/ ambulation... she is advised to f/u w/ Ortho & reconsider staged joint replacement operations... BP is controlled on meds;  denies CP, palpit, ch in DOE, ch in chr edema, etc;  DJD is bone on bone in knees + LBP etc...  we discussed checking non-fasting labs today (see below)...   Current Problems:   HYPERTENSION (ICD-401.9) - on LABETOLOL 200mg - 2TabsBid,  NORVASC 10mg /d,  LOSARTAN 100mg /d,  DEMEDEX 20mg - 2tabs/d,  KCl 86mEqBid... BP today= 146/70 & tol meds well- denies CP, palpit, change in SOB/ edema/ etc... hx  severe HBP in the past w/ eval from Nephrology- DrGarber in 2000 w/ neg MRA Abd- no RAS, +tort AbdAo.Marland KitchenMarland KitchenNOTE: renal function normal,  cholesterol normal... INTOL to Minoxidil in past.  ~  2DEcho 1999 showed mild LVH, EF= 60%, mild MV thickening & MR, AoV sclerosis...  ~  2DEcho 2006 showed mod concentric LVH, AoV sclerosis w/ mild AI, thickened MV w/ mild MR.  ~  baseline CXR w/ cardiomegaly, chr bronchitic changes, prom R superior mediast soft tissue w/o ch.  ~  baseline EKG w/ NSR, LAD, incr voltage, late transition...  ~  2/11: she requested change from Diovan to generic> Losartan 100mg /d.  ~  12/11:  labs showed BUN=22, Creat=1.4, K=3.3.Marland KitchenMarland Kitchen rec to take K20 Bid regularly! same meds for now.  VENOUS INSUFFICIENCY (ICD-459.81) & EDEMA - she is supposed to follow a low sodium diet, elevate legs, & wears support hose when able... continue Demadex 20mg - 2Qam.  OBESITY (ICD-278.00) - she has been counselled on a low carb, low fat, weight reducing diet...  ~  weight 1/10 = 228# which is her peak and up 10# in the last yr...  ~  weight 2/11 = 233#  ~  weight 8/11 = 233#...  reminded of diet + exercise needed.  ~  weight 12/11 = 236#  DIVERTICULOSIS OF COLON (ICD-562.10),  COLONIC POLYPS (ICD-211.3),  HEMORRHOIDS (ICD-455.6) - last  colonoscopy 10/08 by DrStark showed divertics, hems, & several 3-43mm polyps= adenomatous w/ f/u planned in 5 yrs.  ~  2/11:  she notes some constipation & is rec to take Miralax & Senakot-S.  ~  8/11:  [place] written for hems...  RENAL CYST (ICD-593.2) - incidentally noted left lower pole simple renal cyst...  DEGENERATIVE JOINT DISEASE (ICD-715.90) - known bilat knee arthritis w/ eval by DrNitka in 2003... bone-on-bone w/ cortisone shots attempted in 2005, and Synvisc in 2006... she takes MOBIC 7.5mg  Prn & TRAMADOL 50mg  Prn...  BACK PAIN, LUMBAR (ICD-724.2) - she has known DDD...  ~  Shara Blazing tried Lyrica w/ some improvement in symptoms.  VITAMIN D DEFICIENCY  (ICD-268.9) - Vit D level 9/08 = 5... started on Vit D 50,000 u weekly, but she stopped this on her own after several months and switched to Vit D 1000 u daily...  ~  labs 1/10 showed Vit D level = 22... rec to incr OTC Vit D to 2000 u daily (never did).  ~  labs 2/11 showed Vit D level = 21... rec to get on the 2000 u daily!  NEUROPATHY (ICD-355.9)  Hx of ANEMIA (ICD-285.9) - hx anemia from heavy menses before her hysterectomy in 1989... subseq mild anemia- multifactorial in nature...  ~  labs 9/08 showed Hg= 11.8  ~  labs 1/10 showed Hg= 12.1  ~  labs 2/11 showed Hg= 11.9  ~  labs 12/11 showed Hg= 11.0, MCV= 87  Health Maintenance - she is supposed to take ASA 81mg /d,  Calcium Bid,  MVI daily, & Vit D 2000 u daily... she refuses Flu shots.   Current Medications (verified): 1)  Aspirin Adult Low Strength 81 Mg Tbec (Aspirin) .... Take 1 Tablet By Mouth Once A Day 2)  Labetalol Hcl 200 Mg Tabs (Labetalol Hcl) .... Take 2 Tablets By Mouth Two Times A Day 3)  Norvasc 10 Mg Tabs (Amlodipine Besylate) .... Take 1 Tablet By Mouth Once A Day 4)  Losartan Potassium 100 Mg Tabs (Losartan Potassium) .... Take 1 Tab By Mouth Once Daily.Marland KitchenMarland Kitchen 5)  Demadex 20 Mg Tabs (Torsemide) .... Take 2 Tabs By Mouth Once Daily.Marland KitchenMarland Kitchen 6)  Klor-Con M20 20 Meq Cr-Tabs (Potassium Chloride Crys Cr) .... Take One Tablet By Mouth Two Times A Day 7)  Meloxicam 7.5 Mg Tabs (Meloxicam) .... Take 1 Tab By Mouth Once Daily As Needed For Arthritis.Marland KitchenMarland Kitchen 8)  Tramadol Hcl 50 Mg Tabs (Tramadol Hcl) .... Take 1 Tab By Mouth Every 6 H As Needed For Pain... 9)  Multivitamins  Tabs (Multiple Vitamin) .... Take 1 Tablet By Mouth Once A Day 10)  Vitamin D 1000 Unit Tabs (Cholecalciferol) .... Take 2 Caps Daily... 11)  Proctocare-Hc 2.5 % Crea (Hydrocortisone) .... Apply As Directed After Each Bowel Movement...  Allergies (verified): 1)  Naprosyn (Naproxen)  Past History:  Past Medical History: HYPERTENSION (ICD-401.9) VENOUS  INSUFFICIENCY (ICD-459.81) OBESITY (ICD-278.00) DIVERTICULOSIS OF COLON (ICD-562.10) COLONIC POLYPS (ICD-211.3) HEMORRHOIDS (ICD-455.6) RENAL CYST (ICD-593.2) DEGENERATIVE JOINT DISEASE (ICD-715.90) BACK PAIN, LUMBAR (ICD-724.2) VITAMIN D DEFICIENCY (ICD-268.9) NEUROPATHY (ICD-355.9) Hx of ANEMIA (ICD-285.9)  Past Surgical History: S/P TAH & right oopherectomy in 1989 by DrNeal for fibroids & cyst  Family History: Reviewed history from 11/21/2009 and no changes required. Father ?alive, she doesn't know him... Mother alive, age 47 w/ DM, HBP, still works every day! 2 Siblings:  1 Bro w/ hx HBP, stroke, DM... 1 Sis, Isla Pence, w/ HBP  Social History: Reviewed history from 11/21/2009 and no  changes required. Married, husb= Ogema, 78yrs... 1 Child, son age 66 ex-smoker, smoked 33yr, quit 30 yrs ago. social alcohol retired from [employer]...  Review of Systems      See HPI       The patient complains of dyspnea on exertion, muscle weakness, and difficulty walking.  The patient denies anorexia, fever, weight loss, weight gain, vision loss, decreased hearing, hoarseness, chest pain, syncope, peripheral edema, prolonged cough, headaches, hemoptysis, abdominal pain, melena, hematochezia, severe indigestion/heartburn, hematuria, incontinence, suspicious skin lesions, transient blindness, depression, unusual weight change, abnormal bleeding, enlarged lymph nodes, and angioedema.    Vital Signs:  Patient profile:   65 year old female Height:      66 inches O2 Sat:      97 % on Room air Temp:     98.2 degrees F oral Pulse rate:   74 / minute BP sitting:   146 / 70  (left arm) Cuff size:   large  Vitals Entered By: Elita Boone CMA (March 26, 2010 3:26 PM)  O2 Flow:  Room air 7  Physical Exam  Additional Exam:  WD, Overweight, 65 y/o BF in NAD... GENERAL:  Alert & oriented; pleasant & cooperative... HEENT:  [place]/AT, EOM-wnl, PERRLA, EACs-clear, TMs-wnl,  NOSE-clear, THROAT-clear & wnl. NECK:  Supple w/ fairROM; no JVD; normal carotid impulses w/o bruits; palp thyroid w/o nodules; no lymphadenopathy. CHEST:  Clear to P & A; without wheezes/ rales/ or rhonchi heard... HEART:  Regular Rhythm; without murmurs/ rubs/ or gallops detected... ABDOMEN:  Obese, soft & nontender; normal bowel sounds; no organomegaly or masses detected. EXT:  mod-severe arthritic changes; no varicose veins/ +venous insuffic/ 2+edema. NEURO:  CN's intact; motor testing normal; sensory testing normal; gait abn due to severe arthritis. DERM:  No lesions noted; no rash etc...    Impression & Recommendations:  Problem # 1:  DECREASED ENERGY>>> Nonspecific complaint & we decided to check objective data>  LABS: reviewed K=3.3 & reminded totake K20 Bid every day. REC:  needs incr activity, but knees are bone-on-bone & she is encouraged to consider TKRs...  Problem # 2:  HYPERTENSION (ICD-401.9) BP borderline& she is reminded to take all meds regularly, no salt, elevation, TEDs etc... Her updated medication list for this problem includes:    Labetalol Hcl 200 Mg Tabs (Labetalol hcl) .Marland Kitchen... Take 2 tablets by mouth two times a day    Norvasc 10 Mg Tabs (Amlodipine besylate) .Marland Kitchen... Take 1 tablet by mouth once a day    Losartan Potassium 100 Mg Tabs (Losartan potassium) .Marland Kitchen... Take 1 tab by mouth once daily...    Demadex 20 Mg Tabs (Torsemide) .Marland Kitchen... Take 2 tabs by mouth once daily...  Orders: TLB-BMP (Basic Metabolic Panel-BMET) (99991111) TLB-Hepatic/Liver Function Pnl (80076-HEPATIC) TLB-CBC Platelet - w/Differential (85025-CBCD) TLB-TSH (Thyroid Stimulating Hormone) (84443-TSH) TLB-Sedimentation Rate (ESR) (85652-ESR) TLB-Udip w/ Micro (81001-URINE)  Problem # 3:  VENOUS INSUFFICIENCY (ICD-459.81) As above>  no salt, elevation, Support hose, continue diuretic therapy...  Problem # 4:  OBESITY (ICD-278.00) Diet + exercise are the keys...  Problem # 5:   DIVERTICULOSIS OF COLON (ICD-562.10) GI is stable>  same Rx...  Problem # 6:  DEGENERATIVE JOINT DISEASE (ICD-715.90) Severe DJD bone-on -bone etc... she is encouraged to f/u w/ Ortho & reconsider TKRs... Her updated medication list for this problem includes:    Aspirin Adult Low Strength 81 Mg Tbec (Aspirin) .Marland Kitchen... Take 1 tablet by mouth once a day    Meloxicam 7.5 Mg Tabs (Meloxicam) .Marland Kitchen... Take 1  tab by mouth once daily as needed for arthritis...    Tramadol Hcl 50 Mg Tabs (Tramadol hcl) .Marland Kitchen... Take 1 tab by mouth every 6 h as needed for pain...  Problem # 7:  VITAMIN D DEFICIENCY (ICD-268.9) She remains on Vit D supplementation...  Problem # 8:  OTHER MEDICAL PROBLEMS AS NOTED>>> Hg= 11.0 & rec totake 1 a day w/ Fe...  Complete Medication List: 1)  Aspirin Adult Low Strength 81 Mg Tbec (Aspirin) .... Take 1 tablet by mouth once a day 2)  Labetalol Hcl 200 Mg Tabs (Labetalol hcl) .... Take 2 tablets by mouth two times a day 3)  Norvasc 10 Mg Tabs (Amlodipine besylate) .... Take 1 tablet by mouth once a day 4)  Losartan Potassium 100 Mg Tabs (Losartan potassium) .... Take 1 tab by mouth once daily.Marland KitchenMarland Kitchen 5)  Demadex 20 Mg Tabs (Torsemide) .... Take 2 tabs by mouth once daily.Marland KitchenMarland Kitchen 6)  Klor-con M20 20 Meq Cr-tabs (Potassium chloride crys cr) .... Take one tablet by mouth two times a day 7)  Proctocare-hc 2.5 % Crea (Hydrocortisone) .... Apply as directed after each bowel movement... 8)  Meloxicam 7.5 Mg Tabs (Meloxicam) .... Take 1 tab by mouth once daily as needed for arthritis.Marland KitchenMarland Kitchen 9)  Tramadol Hcl 50 Mg Tabs (Tramadol hcl) .... Take 1 tab by mouth every 6 h as needed for pain... 10)  Multivitamins Tabs (Multiple vitamin) .... Take 1 tablet by mouth once a day 11)  Vitamin D 1000 Unit Tabs (Cholecalciferol) .... Take 2 caps daily...  Patient Instructions: 1)  Today we updated your med list- see below.... 2)  Continue your current meds the same for now... 3)  I want you to increase your  exercise program> put on the knee braces & walk, try stationary bike, etc... 4)  Today we did your follow up (non-fasting)labs...  please call the "phone tree" in a few days for your lab results.Marland KitchenMarland Kitchen 5)  Work on [employer] reduction> it will pay huge dividends in energy! 6)  Call for any questions.Marland KitchenMarland Kitchen 7)  Please schedule a follow-up appointment in 6 months, sooner as needed.

## 2010-05-10 NOTE — Progress Notes (Signed)
Summary: returning leigh's call  Phone Note Call from Patient Call back at Home Phone [phone]   Caller: Patient Call For: Lenna Gilford Summary of Call: pt calling stating she is returning Leigh's call from last week.   Initial call taken by: Matthew Folks LPN,  December 27, 624THL 8:52 AM  Follow-up for Phone Call        [email or web address] Elita Boone [facility]  April 03, 2010 12:15 PM   pt called back.  informed her of lab results and rx for cipro for the UTI sent to her pharmacy----[facility] on [address].  Pt verbalized understanding and denied any questions.  [name] LPN  December 27, 624THL 12:22 PM     New/Updated Medications: CIPRO 250 MG TABS (CIPROFLOXACIN HCL) Take 1 tablet by mouth two times a day Prescriptions: CIPRO 250 MG TABS (CIPROFLOXACIN HCL) Take 1 tablet by mouth two times a day  #14 x 0   Entered by:   Matthew Folks LPN   Authorized by:   Noralee Space MD   Signed by:   Matthew Folks LPN on 624THL   Method used:   Electronically to        [facility] [phone]* (retail)       [place], [place]  [id number]       Ph: [phone] or [phone]       Fax: [phone]   RxID:   [id number]

## 2010-05-23 ENCOUNTER — Ambulatory Visit: Payer: Self-pay | Admitting: Pulmonary Disease

## 2010-07-02 ENCOUNTER — Telehealth: Payer: Self-pay | Admitting: Pulmonary Disease

## 2010-07-02 MED ORDER — LOSARTAN POTASSIUM 100 MG PO TABS: 100.0000 mg | ORAL_TABLET | Freq: Every day | ORAL | Status: DC

## 2010-07-02 MED ORDER — AMLODIPINE BESYLATE 10 MG PO TABS: 10.0000 mg | ORAL_TABLET | Freq: Every day | ORAL | Status: DC

## 2010-07-02 NOTE — Telephone Encounter (Signed)
Returning call.

## 2010-07-02 NOTE — Telephone Encounter (Signed)
Spoke with pt to verify the msg.  She wants rxs for losartan and norvasc sent to [facility] [address].  This was done.

## 2010-07-02 NOTE — Telephone Encounter (Signed)
[email or web address]

## 2010-07-21 ENCOUNTER — Other Ambulatory Visit: Payer: Self-pay | Admitting: Pulmonary Disease

## 2010-10-08 ENCOUNTER — Telehealth: Payer: Self-pay | Admitting: Pulmonary Disease

## 2010-10-08 MED ORDER — LOSARTAN POTASSIUM 100 MG PO TABS: 100.0000 mg | ORAL_TABLET | Freq: Every day | ORAL | Status: DC

## 2010-10-08 MED ORDER — AMLODIPINE BESYLATE 10 MG PO TABS: 10.0000 mg | ORAL_TABLET | Freq: Every day | ORAL | Status: DC

## 2010-10-08 NOTE — Telephone Encounter (Signed)
New Rx for meds sent to pharmacy. Left a detailed msg for the pt so she would be aware. Told her to call if there were any questions.

## 2010-10-22 ENCOUNTER — Other Ambulatory Visit: Payer: Self-pay | Admitting: Obstetrics & Gynecology

## 2010-10-22 DIAGNOSIS — R921 Mammographic calcification found on diagnostic imaging of breast: Secondary | ICD-10-CM

## 2011-01-03 ENCOUNTER — Ambulatory Visit
Admission: RE | Admit: 2011-01-03 | Discharge: 2011-01-03 | Disposition: A | Discharge status: Home / Self Care | Payer: Medicare Other | Source: Ambulatory Visit | Attending: Obstetrics & Gynecology | Admitting: Obstetrics & Gynecology

## 2011-01-03 DIAGNOSIS — R921 Mammographic calcification found on diagnostic imaging of breast: Secondary | ICD-10-CM

## 2011-03-20 ENCOUNTER — Encounter: Payer: Self-pay | Admitting: Pulmonary Disease

## 2011-03-20 ENCOUNTER — Ambulatory Visit (INDEPENDENT_AMBULATORY_CARE_PROVIDER_SITE_OTHER)
Admission: RE | Admit: 2011-03-20 | Discharge: 2011-03-20 | Disposition: A | Discharge status: Home / Self Care | Payer: Medicare Other | Source: Ambulatory Visit | Attending: Pulmonary Disease | Admitting: Pulmonary Disease

## 2011-03-20 ENCOUNTER — Ambulatory Visit (INDEPENDENT_AMBULATORY_CARE_PROVIDER_SITE_OTHER): Payer: Medicare Other | Admitting: Pulmonary Disease

## 2011-03-20 DIAGNOSIS — M545 Low back pain, unspecified: Secondary | ICD-10-CM

## 2011-03-20 DIAGNOSIS — K5730 Diverticulosis of large intestine without perforation or abscess without bleeding: Secondary | ICD-10-CM

## 2011-03-20 DIAGNOSIS — I10 Essential (primary) hypertension: Secondary | ICD-10-CM

## 2011-03-20 DIAGNOSIS — E785 Hyperlipidemia, unspecified: Secondary | ICD-10-CM

## 2011-03-20 DIAGNOSIS — R0602 Shortness of breath: Secondary | ICD-10-CM

## 2011-03-20 DIAGNOSIS — I872 Venous insufficiency (chronic) (peripheral): Secondary | ICD-10-CM

## 2011-03-20 DIAGNOSIS — G589 Mononeuropathy, unspecified: Secondary | ICD-10-CM

## 2011-03-20 DIAGNOSIS — E559 Vitamin D deficiency, unspecified: Secondary | ICD-10-CM

## 2011-03-20 DIAGNOSIS — D649 Anemia, unspecified: Secondary | ICD-10-CM

## 2011-03-20 DIAGNOSIS — D126 Benign neoplasm of colon, unspecified: Secondary | ICD-10-CM

## 2011-03-20 DIAGNOSIS — R609 Edema, unspecified: Secondary | ICD-10-CM

## 2011-03-20 DIAGNOSIS — F419 Anxiety disorder, unspecified: Secondary | ICD-10-CM

## 2011-03-20 DIAGNOSIS — M1990 Unspecified osteoarthritis, unspecified site: Secondary | ICD-10-CM

## 2011-03-20 DIAGNOSIS — E669 Obesity, unspecified: Secondary | ICD-10-CM

## 2011-03-20 MED ORDER — VALSARTAN 320 MG PO TABS: 320.0000 mg | ORAL_TABLET | Freq: Every day | ORAL | Status: DC

## 2011-03-20 MED ORDER — LABETALOL HCL 200 MG PO TABS: 200.0000 mg | ORAL_TABLET | Freq: Two times a day (BID) | ORAL | Status: DC

## 2011-03-20 MED ORDER — AMLODIPINE BESYLATE 10 MG PO TABS: 10.0000 mg | ORAL_TABLET | Freq: Every day | ORAL | Status: DC

## 2011-03-20 NOTE — Progress Notes (Signed)
Subjective:    Patient ID: [name], female    DOB: 07-21-1945, 65 y.o.   MRN: [id number]  HPI 65 y/o BF here for a follow up visit... she has multiple medical problems as noted below, and a hx of poor compliance w/ med Rx and office follow-ups...   ~  May 23, 2009:  she states that she has been stable over the past yr- feeling OK w/o new complaints or concerns... she says she's been taking her meds regularly but is out of diovan & Demadex- needing refills of all meds today but asking to change the Diovan since co-pay jumped to tier3...  her weight is up 5# to 233# today & she has 2+edema, not dieting, not restricting sodium, etc... she is limited by her arthritis which is bone-on-bone in the knees but she is holding off on any ortho procedures....  ~  November 21, 2009:  11mo ROV doing reasonably well but notes some sinus pressure & hemorrhoid problems (we discussed Mucinex for the former & [place] for the latter)... her weight is unchanged at 233# & it is apparent that she is not dieting or exercising... she is followed by Shara Blazing for Ortho w/ end stage OA of knees but she is holding off on TKR so far... BP controlled on meds but still w/ severe VI & pedal edema despite Demadex 2/d> reminded about no salt, elevation, support hose.  ~  March 26, 2010:  c/o decr energy recently- weight 236# is up 3#, still not dieting or exercising although she has knee braces to help w/ ambulation... she is advised to f/u w/ Ortho & reconsider staged joint replacement operations... BP is controlled on meds;  denies CP, palpit, ch in DOE, ch in chr edema, etc;  DJD is bone on bone in knees + LBP etc...  we discussed checking non-fasting labs today (see below)...  ~  March 20, 2011:  Yearly [place] says she is doing well overall but nothing really has changed> not dieting effectively w/ wt up to 240# "it was Thanksgiving"; BP elevated & she states "I've been eating soup in the can";  Exercise is  difficult due to severe knee arthritis & she hasn't come to grips w/ the need for TKRs;  See prob list below...  HBP> on 64meds: Labet200-2Bid, Amlodip10, Losar100, Demedex20-2Qam w/ K20Bid; BP today= 180/92 & I suspect she has run out of several meds, didn't bring bottles & requesting refill Rxs today; she wants to change the Losartan back to Diovan320; CXR today w/ mild cardiomeg, Electrolyes are wnl & renal function is normal, BNP similarly is ok at 58... Discussed w/ pt> MUST take meds every day, get on diet 7 get wt down!!!  VI/ Edema> eating way too much sodium & taking the demedex 40qd; rec 2gm Na+ diet, elevation, support hose, same meds...  Obesity> it is critical that she get her wt down; hindered by arthritis making exercise difficult; rec for water exerc, non wt bearing exerc etc...  Divertics/ Colon polyps/ Hems> on [place] as needed; last colon 10/08 & f/u due 10/13...  DJD/ severe bilat knee arthritis/ DDD> on Mobic7.5 + Tramadol50 prn; her only viable option is TKR surg but she is reluctant to consider, offered 2nd opinion at [facility]...  Vit D Defic> Vit D level remains low at 15; we will Rx w/ 50K weekly Rx...  Anemia> Hg= 11.9 sl improved...          Problem List:   HYPERTENSION (ICD-401.9) -  on LABETOLOL 200mg - 2TabsBid,  NORVASC 10mg /d,  LOSARTAN 100mg /d,  DEMEDEX 20mg - 2tabs/d,  KCl 66mEqBid... hx severe HBP in the past w/ eval from Nephrology- DrGarber in 2000 w/ neg MRA Abd- no RAS, +tort AbdAo.Marland KitchenMarland KitchenNOTE: renal function normal,  cholesterol normal... INTOL to Minoxidil in past. ~  2DEcho 1999 showed mild LVH, EF= 60%, mild MV thickening & MR, AoV sclerosis... ~  2DEcho 2006 showed mod concentric LVH, AoV sclerosis w/ mild AI, thickened MV w/ mild MR. ~  baseline CXR w/ cardiomegaly, chr bronchitic changes, prom R superior mediast soft tissue w/o ch. ~  baseline EKG w/ NSR, LAD, incr voltage, late transition... ~  2/11: she requested change from Diovan to generic> Losartan  100mg /d. ~  12/11:  BP= 146/70 & tol meds well- denies CP, palpit, change in SOB or edema... labs showed BUN=22, Creat=1.4, K=3.3.Marland KitchenMarland Kitchen rec to take K20 Bid regularly! ~  12/12:  BP= 180/92 & medication compliance is suspect (didn't bring bottles to inspect); she is requesting change from Losartan back to [id number], labs ok & she is asked to f/u in 3-4 months to recheck BP & consider f/u 2DEcho...  VENOUS INSUFFICIENCY (ICD-459.81) & EDEMA - she is supposed to follow a low sodium diet, elevate legs, & wears support hose when able... continue Demadex 20mg - 2Qam. ~  12/12: she's been consuming way too much sodium & we reviewed a 2gm Na+ diet & wt reduction needed...  OBESITY (ICD-278.00) - she has been counselled on a low carb, low fat, weight reducing diet... ~  weight 1/10 = 228# which is her peak and up 10# in the last yr... ~  weight 2/11 = 233# ~  weight 8/11 = 233#...  reminded of diet + exercise needed. ~  weight 12/11 = 236# ~  Weight 12/12 = 240#... rec [facility] vs wt watchers etc...  DIVERTICULOSIS OF COLON (ICD-562.10),  COLONIC POLYPS (ICD-211.3),  HEMORRHOIDS (ICD-455.6) - last colonoscopy 10/08 by DrStark showed divertics, hems, & several 3-55mm polyps= adenomatous w/ f/u planned in 5 yrs. ~  2/11:  she notes some constipation & is rec to take Miralax & Senakot-S. ~  8/11:  [place] written for hems...  RENAL CYST (ICD-593.2) - incidentally noted left lower pole simple renal cyst...  DEGENERATIVE JOINT DISEASE (ICD-715.90) - known bilat knee arthritis w/ eval by DrNitka in 2003... bone-on-bone w/ cortisone shots attempted in 2005, and Synvisc in 2006... she takes MOBIC 7.5mg  Prn & TRAMADOL 50mg  Prn... ~  12/12: we reviewed her severe OA situation w/ her; she will need bilat TKRs eventually & should proceed, offered 2nd opinion at [facility]...  BACK PAIN, LUMBAR (ICD-724.2) - she has known DDD... ~  Shara Blazing tried Lyrica w/ some improvement in symptoms.  VITAMIN D  DEFICIENCY (ICD-268.9) - Vit D level 9/08 = 5... started on Vit D 50,000 u weekly, but she stopped this on her own after several months and switched to Vit D 1000 u daily... ~  labs 1/10 showed Vit D level = 22... rec to incr OTC Vit D to 2000 u daily (never did). ~  labs 2/11 showed Vit D level = 21... rec to get on the 2000 u daily! ~  Labs 12/12 showed Vit d level = 15... rec to restart VitD 50000 u weekly & stay on this!  NEUROPATHY (ICD-355.9)  Hx of ANEMIA (ICD-285.9) - hx anemia from heavy menses before her hysterectomy in 1989... subseq mild anemia- multifactorial in nature... ~  labs 9/08 showed Hg=  11.8 ~  labs 1/10 showed Hg= 12.1 ~  labs 2/11 showed Hg= 11.9 ~  labs 12/11 showed Hg= 11.0, MCV= 87 ~  Labs 12/12 showed Hg= 11.9, MCV= 85  Health Maintenance - she is supposed to take ASA 81mg /d,  Calcium Bid,  MVI daily, & Vit D 2000 u daily... she refuses Flu shots.   Past Surgical History  Procedure Date  . [place] and right oopherectomy 1998    Dr. Nori Riis for fibroids and cyst    Outpatient Encounter Prescriptions as of 03/20/2011  Medication Sig Dispense Refill  . amLODipine (NORVASC) 10 MG tablet Take 1 tablet (10 mg total) by mouth daily.  30 tablet  5  . aspirin 81 MG tablet Take 81 mg by mouth daily.        . cholecalciferol (VITAMIN D) 1000 UNITS tablet Take 2,000 Units by mouth daily.        . hydrocortisone (ANUSOL-HC) 2.5 % rectal cream Place 1 application rectally as needed.        . labetalol (NORMODYNE) 200 MG tablet TAKE 2 TABLETS TWICE A DAY  360 tablet  3  . losartan (COZAAR) 100 MG tablet Take 1 tablet (100 mg total) by mouth daily.  30 tablet  5  . meloxicam (MOBIC) 7.5 MG tablet Take 7.5 mg by mouth daily. As needed for arthritis pain       . Multiple Vitamins-Minerals (MULTIVITAMIN PO) Take 1 tablet by mouth daily.        . potassium chloride SA (K-DUR,KLOR-CON) 20 MEQ tablet Take 20 mEq by mouth 2 (two) times daily.        Marland Kitchen torsemide (DEMADEX) 20 MG tablet  Take 40 mg by mouth daily.        . traMADol (ULTRAM) 50 MG tablet Take 50 mg by mouth every 6 (six) hours as needed. Maximum dose= 8 tablets per day         Allergies  Allergen Reactions  . Naproxen     REACTION: swelling    Current Medications, Allergies, Past Medical History, Past Surgical History, Family History, and Social History were reviewed in [employer] record.    Review of Systems         See HPI - all other systems neg except as noted...  The patient complains of dyspnea on exertion, muscle weakness, and difficulty walking.  The patient denies anorexia, fever, weight loss, weight gain, vision loss, decreased hearing, hoarseness, chest pain, syncope, peripheral edema, prolonged cough, headaches, hemoptysis, abdominal pain, melena, hematochezia, severe indigestion/heartburn, hematuria, incontinence, suspicious skin lesions, transient blindness, depression, unusual weight change, abnormal bleeding, enlarged lymph nodes, and angioedema.   Objective:   Physical Exam      WD, Overweight, 65 y/o BF in NAD... GENERAL:  Alert & oriented; pleasant & cooperative... HEENT:  [place]/AT, EOM-wnl, PERRLA, EACs-clear, TMs-wnl, NOSE-clear, THROAT-clear & wnl. NECK:  Supple w/ fairROM; no JVD; normal carotid impulses w/o bruits; palp thyroid w/o nodules; no lymphadenopathy. CHEST:  Clear to P & A; without wheezes/ rales/ or rhonchi heard... HEART:  Regular Rhythm; without murmurs/ rubs/ or gallops detected... ABDOMEN:  Obese, soft & nontender; normal bowel sounds; no organomegaly or masses detected. EXT:  mod-severe arthritic changes; no varicose veins/ +venous insuffic/ 2+edema. NEURO:  CN's intact; motor testing normal; sensory testing normal; gait abn due to severe arthritis. DERM:  No lesions noted; no rash etc...  RADIOLOGY DATA:  Reviewed in the EPIC EMR & discussed w/ the patient...  LABORATORY  DATA:  Reviewed in the EPIC EMR & discussed w/ the  patient...   Assessment & Plan:   HBP>  Hx severe HBP & I believe she has become complacent and noncompliant w/ her 4 med regimen; meds refilled and Losartan changed to [id number] per her request...   VI/ Edema>  She MUST comply w/ low sodium restriction & canned soups won't cut it; she was given another 2gm Na+ diet sheet; reminded to elevate legs & wear support hose...  Obesity>  She's been totally ineffective on diet/ exercise; needs diet counseling wt watchers or something similar for help & she will consider her options and make her choice...  GI> Divertics, Polyps>  Stable, using [place] as needed; f/u colonoscopy due 10/13...  DJD, LBP>  This is one of her most signif issues- severe DJD knees do not allow for suffic exercise/ activity & wt reduction is difficult; advised to reconsider TKRs because without it she will be wheelchair bound & immobile; offered 2nd opinion at [place] she will decide...  Vit D defic>  Severe Vit D defic, age 28, immobile, etc... Needs to start Vit D 50K weekly, along w/ Calcium, MVI, and needs BMD measurement here...  Neuropathy>  Aware, likely related to her DDD but she states not that much back discomfort at this point...  Anemia>  Hg actually improved to 11.9 at present.Marland KitchenMarland Kitchen

## 2011-03-20 NOTE — Patient Instructions (Signed)
Today we updated your med list in our EPIC system...    We refilled your meds per request, but we decided to switch the Losartan for the new generic Diovan...  Today we did your follow up CXR; please return to our lab one morning soon for your FASTING blood work...    Then please call the [facility]REE in a few days for your results...    Dial [id number] & when prompted enter your patient number followed by the # symbol...    Your patient number is:  [id number]  We gave you the 2012 Flu vaccine today...  Call for any questions......  Let's plan a follow up visit in 1 years time, sooner if needed for problems.Marland KitchenMarland Kitchen

## 2011-03-25 ENCOUNTER — Telehealth: Payer: Self-pay | Admitting: Pulmonary Disease

## 2011-03-25 NOTE — Telephone Encounter (Signed)
lmomtcb x1 

## 2011-03-25 NOTE — Telephone Encounter (Signed)
Per SN---ok to change to losartan 100mg    1 tablet daily.  Called and lmomtcb for pt to call back to see if she wants the #90 or #30 and to make sure of pharmacy to send this med to.

## 2011-03-25 NOTE — Telephone Encounter (Signed)
Pt states at last OV she spoke with Dr. Lenna Gilford about switching her BP medications to generic diovan but she states this will cost her $41 a month so she is requesting to go back on losartan 100 mg daily instead. This is was she was previously on and it was much cheaper.  Please advise if ok to change back. Thanks. Belle Rose Bing, CMA Allergies  Allergen Reactions  . Naproxen     REACTION: swelling

## 2011-03-26 MED ORDER — LOSARTAN POTASSIUM 100 MG PO TABS: 100.0000 mg | ORAL_TABLET | Freq: Every day | ORAL | Status: DC

## 2011-03-26 NOTE — Telephone Encounter (Signed)
Patient calling back.  She is wanting a 30 day supply and pharmacy is [facility].

## 2011-03-26 NOTE — Telephone Encounter (Signed)
Left a detailed msg for pt so she would be aware her prescription has been sent to her pharmacy for losartan. No need for callback.

## 2011-03-27 ENCOUNTER — Other Ambulatory Visit (INDEPENDENT_AMBULATORY_CARE_PROVIDER_SITE_OTHER): Payer: Medicare Other

## 2011-03-27 DIAGNOSIS — D126 Benign neoplasm of colon, unspecified: Secondary | ICD-10-CM

## 2011-03-27 DIAGNOSIS — E559 Vitamin D deficiency, unspecified: Secondary | ICD-10-CM

## 2011-03-27 DIAGNOSIS — F411 Generalized anxiety disorder: Secondary | ICD-10-CM

## 2011-03-27 DIAGNOSIS — R0602 Shortness of breath: Secondary | ICD-10-CM

## 2011-03-27 DIAGNOSIS — D649 Anemia, unspecified: Secondary | ICD-10-CM

## 2011-03-27 DIAGNOSIS — I10 Essential (primary) hypertension: Secondary | ICD-10-CM

## 2011-03-27 DIAGNOSIS — R609 Edema, unspecified: Secondary | ICD-10-CM

## 2011-03-27 DIAGNOSIS — E785 Hyperlipidemia, unspecified: Secondary | ICD-10-CM

## 2011-03-27 DIAGNOSIS — F419 Anxiety disorder, unspecified: Secondary | ICD-10-CM

## 2011-03-27 LAB — CBC WITH DIFFERENTIAL/PLATELET
Basophils Relative: 0.3 % (ref 0.0–3.0)
Eosinophils Absolute: 0.1 10*3/uL (ref 0.0–0.7)
Lymphocytes Relative: 31 % (ref 12.0–46.0)
MCHC: 33.6 g/dL (ref 30.0–36.0)
MCV: 85 fl (ref 78.0–100.0)
Monocytes Absolute: 0.9 10*3/uL (ref 0.1–1.0)
Neutrophils Relative %: 55.7 % (ref 43.0–77.0)
Platelets: 249 10*3/uL (ref 150.0–400.0)
RBC: 4.18 Mil/uL (ref 3.87–5.11)
WBC: 7.7 10*3/uL (ref 4.5–10.5)

## 2011-03-27 LAB — HEPATIC FUNCTION PANEL
ALT: 43 U/L — ABNORMAL HIGH (ref 0–35)
AST: 38 U/L — ABNORMAL HIGH (ref 0–37)
Bilirubin, Direct: 0.1 mg/dL (ref 0.0–0.3)
Total Bilirubin: 0.6 mg/dL (ref 0.3–1.2)
Total Protein: 7.6 g/dL (ref 6.0–8.3)

## 2011-03-27 LAB — TSH: TSH: 1.32 u[IU]/mL (ref 0.35–5.50)

## 2011-03-27 LAB — LIPID PANEL
Cholesterol: 140 mg/dL (ref 0–200)
Triglycerides: 91 mg/dL (ref 0.0–149.0)

## 2011-03-27 LAB — BASIC METABOLIC PANEL
BUN: 20 mg/dL (ref 6–23)
CO2: 33 mEq/L — ABNORMAL HIGH (ref 19–32)
Chloride: 104 mEq/L (ref 96–112)
Creatinine, Ser: 1.1 mg/dL (ref 0.4–1.2)
Potassium: 3.7 mEq/L (ref 3.5–5.1)

## 2011-03-27 LAB — BRAIN NATRIURETIC PEPTIDE: Pro B Natriuretic peptide (BNP): 58 pg/mL (ref 0.0–100.0)

## 2011-03-28 LAB — VITAMIN D 25 HYDROXY (VIT D DEFICIENCY, FRACTURES): Vit D, 25-Hydroxy: 15 ng/mL — ABNORMAL LOW (ref 30–89)

## 2011-04-06 ENCOUNTER — Encounter: Payer: Self-pay | Admitting: Pulmonary Disease

## 2011-04-08 ENCOUNTER — Telehealth: Payer: Self-pay | Admitting: Pulmonary Disease

## 2011-04-08 NOTE — Telephone Encounter (Signed)
lmomtcb on Wednesday---about her lab results.

## 2011-04-10 ENCOUNTER — Other Ambulatory Visit: Payer: Self-pay | Admitting: *Deleted

## 2011-04-10 MED ORDER — VITAMIN D (ERGOCALCIFEROL) 1.25 MG (50000 UNIT) PO CAPS: 50000.0000 [IU] | ORAL_CAPSULE | ORAL | Status: DC

## 2011-04-10 NOTE — Telephone Encounter (Signed)
Pt called me back and she is aware of her lab results per SN.   See result note

## 2011-04-10 NOTE — Telephone Encounter (Signed)
lmomtcb for pt to call back

## 2011-04-10 NOTE — Telephone Encounter (Signed)
Returning Leigh's call can be reached at [phone].Elnita Maxwell

## 2011-09-23 ENCOUNTER — Other Ambulatory Visit: Payer: Self-pay | Admitting: Pulmonary Disease

## 2011-11-27 ENCOUNTER — Encounter: Payer: Self-pay | Admitting: Gastroenterology

## 2011-12-25 ENCOUNTER — Other Ambulatory Visit: Payer: Self-pay | Admitting: Pulmonary Disease

## 2012-01-09 ENCOUNTER — Encounter: Payer: Self-pay | Admitting: *Deleted

## 2012-01-10 ENCOUNTER — Other Ambulatory Visit (INDEPENDENT_AMBULATORY_CARE_PROVIDER_SITE_OTHER): Payer: Medicare Other

## 2012-01-10 ENCOUNTER — Encounter: Payer: Self-pay | Admitting: Pulmonary Disease

## 2012-01-10 ENCOUNTER — Ambulatory Visit (INDEPENDENT_AMBULATORY_CARE_PROVIDER_SITE_OTHER): Payer: Medicare Other | Admitting: Pulmonary Disease

## 2012-01-10 ENCOUNTER — Encounter: Payer: Self-pay | Admitting: Gastroenterology

## 2012-01-10 VITALS — BP 162/92 | HR 76 | Temp 98.4°F | Ht 66.0 in | Wt 238.8 lb

## 2012-01-10 DIAGNOSIS — F411 Generalized anxiety disorder: Secondary | ICD-10-CM

## 2012-01-10 DIAGNOSIS — R0600 Dyspnea, unspecified: Secondary | ICD-10-CM

## 2012-01-10 DIAGNOSIS — E559 Vitamin D deficiency, unspecified: Secondary | ICD-10-CM

## 2012-01-10 DIAGNOSIS — D649 Anemia, unspecified: Secondary | ICD-10-CM

## 2012-01-10 DIAGNOSIS — I872 Venous insufficiency (chronic) (peripheral): Secondary | ICD-10-CM

## 2012-01-10 DIAGNOSIS — F419 Anxiety disorder, unspecified: Secondary | ICD-10-CM

## 2012-01-10 DIAGNOSIS — M545 Low back pain: Secondary | ICD-10-CM

## 2012-01-10 DIAGNOSIS — I10 Essential (primary) hypertension: Secondary | ICD-10-CM

## 2012-01-10 DIAGNOSIS — E7800 Pure hypercholesterolemia, unspecified: Secondary | ICD-10-CM

## 2012-01-10 DIAGNOSIS — G589 Mononeuropathy, unspecified: Secondary | ICD-10-CM

## 2012-01-10 DIAGNOSIS — E669 Obesity, unspecified: Secondary | ICD-10-CM

## 2012-01-10 DIAGNOSIS — R609 Edema, unspecified: Secondary | ICD-10-CM

## 2012-01-10 DIAGNOSIS — R1030 Lower abdominal pain, unspecified: Secondary | ICD-10-CM

## 2012-01-10 DIAGNOSIS — D126 Benign neoplasm of colon, unspecified: Secondary | ICD-10-CM

## 2012-01-10 DIAGNOSIS — Z23 Encounter for immunization: Secondary | ICD-10-CM

## 2012-01-10 DIAGNOSIS — K5730 Diverticulosis of large intestine without perforation or abscess without bleeding: Secondary | ICD-10-CM

## 2012-01-10 DIAGNOSIS — M1990 Unspecified osteoarthritis, unspecified site: Secondary | ICD-10-CM

## 2012-01-10 LAB — CBC WITH DIFFERENTIAL/PLATELET
Basophils Absolute: 0 10*3/uL (ref 0.0–0.1)
Eosinophils Relative: 0.9 % (ref 0.0–5.0)
HCT: 34.9 % — ABNORMAL LOW (ref 36.0–46.0)
Hemoglobin: 11.4 g/dL — ABNORMAL LOW (ref 12.0–15.0)
Lymphocytes Relative: 28.6 % (ref 12.0–46.0)
Lymphs Abs: 2.2 10*3/uL (ref 0.7–4.0)
Monocytes Relative: 9 % (ref 3.0–12.0)
Neutro Abs: 4.8 10*3/uL (ref 1.4–7.7)
RBC: 4.04 Mil/uL (ref 3.87–5.11)
WBC: 7.8 10*3/uL (ref 4.5–10.5)

## 2012-01-10 LAB — URINALYSIS, ROUTINE W REFLEX MICROSCOPIC
Bilirubin Urine: NEGATIVE
Ketones, ur: NEGATIVE
Nitrite: NEGATIVE
Total Protein, Urine: 30
Urine Glucose: NEGATIVE
pH: 6 (ref 5.0–8.0)

## 2012-01-10 LAB — HEPATIC FUNCTION PANEL
ALT: 42 U/L — ABNORMAL HIGH (ref 0–35)
AST: 38 U/L — ABNORMAL HIGH (ref 0–37)
Albumin: 3.6 g/dL (ref 3.5–5.2)
Alkaline Phosphatase: 78 U/L (ref 39–117)
Bilirubin, Direct: 0.1 mg/dL (ref 0.0–0.3)
Total Bilirubin: 0.7 mg/dL (ref 0.3–1.2)
Total Protein: 7.9 g/dL (ref 6.0–8.3)

## 2012-01-10 LAB — SEDIMENTATION RATE: Sed Rate: 54 mm/hr — ABNORMAL HIGH (ref 0–22)

## 2012-01-10 LAB — BASIC METABOLIC PANEL
BUN: 20 mg/dL (ref 6–23)
CO2: 30 mEq/L (ref 19–32)
Calcium: 9.9 mg/dL (ref 8.4–10.5)
Creatinine, Ser: 1 mg/dL (ref 0.4–1.2)
GFR: 69.67 mL/min (ref >60.00)
Glucose, Bld: 84 mg/dL (ref 70–99)
Sodium: 139 mEq/L (ref 135–145)

## 2012-01-10 LAB — LIPID PANEL
HDL: 50.2 mg/dL (ref >39.00)
LDL Cholesterol: 77 mg/dL (ref 0–99)
Total CHOL/HDL Ratio: 3
VLDL: 18.6 mg/dL (ref 0.0–40.0)

## 2012-01-10 LAB — BRAIN NATRIURETIC PEPTIDE: Pro B Natriuretic peptide (BNP): 58 pg/mL (ref 0.0–100.0)

## 2012-01-10 MED ORDER — MINOXIDIL 10 MG PO TABS: 10.0000 mg | ORAL_TABLET | Freq: Every day | ORAL | Status: DC

## 2012-01-10 NOTE — Patient Instructions (Addendum)
Today we updated your med list in our EPIC system...    Continue your current medications the same...    We refilled your meds per request...  We decided to STOP the NORVASC (Amlodipine) & substitite a new med= MINOXIDIL 10mg  - take one tab daily...  Today we did your follow up FASTING blood work...    We will schedule a CT scan of your abdomen & pelvis to evaluate your discomfort...       We will also sched your follow up appt w/ DrStark...          We will call you w/ these results...  Let's plan a follow up visit in 6-8 weeks to recheck your swelling & BP.Marland KitchenMarland Kitchen

## 2012-01-10 NOTE — Progress Notes (Addendum)
Subjective:    Patient ID: [name], female    DOB: [birth year], 66 y.o.   MRN: [id number]  HPI 66 y/o BF here for a follow up visit... she has multiple medical problems as noted below, and a hx of poor compliance w/ med Rx and office follow-ups...   ~  March 20, 2011:  Yearly [place] says she is doing well overall but nothing really has changed> not dieting effectively w/ wt up to 240# "it was Thanksgiving"; BP elevated & she states "I've been eating soup in the can";  Exercise is difficult due to severe knee arthritis & she hasn't come to grips w/ the need for TKRs;  See prob list below...    HBP> on 12meds: Labet200-2Bid, Amlodip10, Losar100, Demedex20-2Qam w/ K20Bid; BP today= 180/92 & I suspect she has run out of several meds, didn't bring bottles & requesting refill Rxs today; she wants to change the Losartan back to Diovan320; CXR today w/ mild cardiomeg, Electrolyes are wnl & renal function is normal, BNP similarly is ok at 58... Discussed w/ pt> MUST take meds every day, get on diet & get wt down!!!    VI/ Edema> eating way too much sodium & taking the Demedex 40qd; rec 2gm Na+ diet, elevation, support hose, same meds...    Obesity> it is critical that she get her wt down; hindered by arthritis making exercise difficult; rec for water exerc, non wt bearing exerc etc...    Divertics/ Colon polyps/ Hems> on [place] as needed; last colon 10/08 & f/u due 10/13...    DJD/ severe bilat knee arthritis/ DDD> on Mobic7.5 + Tramadol50 prn; her only viable option is TKR surg but she is reluctant to consider, offered 2nd opinion at [id number]...    Vit D Defic> Vit D level remains low at 15; we will Rx w/ 50K weekly Rx...    Anemia> Hg= 11.9 sl improved...  ~  January 10, 2012:  28mo ROV & Zineb has 3 main problems today> BP, Edema, Abd pain>> 1) HBP> supposed to be on Labet200Bid, Amlod10, Losar100, Demadex20-2Qam & K20-?Bid; a call to her mult pharmacies indicates that she hasn't been filling  the diuretic or KCl at all; exam shows BP= 160/90 and she has chrVI in legs w/ 4+pitting in ankles; once again asked to take all meds every day, and we decied to stop the Amlod10 due to edema & substitute Minoxidil10mg /d;  Plan ROV 90mo w/ all med bottles to recheck... 2) VI, Edema> she has chronic lymphedema in LEs w/ 4+pitting in ankles today; ?it appears from call to her [employer] that she is not taking the Demadex? Or the KCl; We discussed Rx w/ NO SALT, elevation, support hose vs ACE wraps, & take her diuretic daily... ?any option for a Lymphedema clinic? Plan ROV 69mo w/ all med bottles to recheck...  3) Lower Abd Pain> c/o vague 60mo hx of lower abd discomfort across the suprapubic area, assoc w/ some constip; she denies other abd pain, n/v, swelling, gas, etc; she notes hard stools, some straining, denies blood; she has never had CT Abd & we decided to proceed w/ CT and refer to DrStark for colonoscopy due; Add Miralax & Senakot-S... 4) As noted she has numerous additional medical problems> SEE BELOW; and hx of poor compliance w/ medications and medical follow up... We reviewed prob list, meds, xrays and labs> see below for updates >> OK Flu vaccine today... LABS 10/13:  FLP- all parameters at goals;  Chems- ok  x K=3.3 & sl incr LFTs;  CBC- mild anemia w/ Hg=11.4;  TSH=1.03;  VitD=42;  BNP=58;  Sed=54; UA- clear. CT Abd&Pelvis> done 01/14/12> NAD... There was cardiomeg, & scarring at lung bases; left renal cyst, scat divertics w/o inflamm, s/o hyst, mild degen arthritis in spine, tortuous calcif Ao, NAD...  ADDENDUM 02/03/12>> pt has called several times since 01/10/12 OV where we stopped her Amlodipine10 due to edema & started Minoxidil10;  She was DrStark 10/10 & office BP=152/80;  She called 10/14 c/o rapid heartbeat & :edema" from the Minoxidil, her Pharmacist told her to stop it;  Then her GYN said BP was 210/110 => 160/84;  We reviewed all her meds and rec the following>> restart Amlodipine at 5mg /d,  increase Labetolol 200mg - to 2tabsBid, keep Losartan 100mg /d, keep Demadex20- 2Qam, keep K20Bid; reminded to take all meds regularly every day & bring all bottles to med visits; and we will refer to Cards for their suggestions (needs f/u 2DEcho).          Problem List:   HYPERTENSION (ICD-401.9) - on LABETOLOL 200mg Bid,  NORVASC 10mg /d,  LOSARTAN 100mg /d,  DEMEDEX 20mg - 2tabs/d,  KCl 68mEqBid... hx severe HBP in the past w/ eval from Nephrology- DrGarber in 2000 w/ neg MRA Abd- no RAS, +tort AbdAo.Marland KitchenMarland KitchenNOTE: renal function normal,  cholesterol normal... ?INTOL to Minoxidil in past. ~  2DEcho 1999 showed mild LVH, EF= 60%, mild MV thickening & MR, AoV sclerosis... ~  2DEcho 2006 showed mod concentric LVH, AoV sclerosis w/ mild AI, thickened MV w/ mild MR. ~  baseline CXR w/ cardiomegaly, chr bronchitic changes, prom R superior mediast soft tissue w/o ch. ~  baseline EKG w/ NSR, LAD, incr voltage, late transition... ~  2/11: she requested change from Diovan to generic> Losartan 100mg /d. ~  12/11:  BP= 146/70 & tol meds well- denies CP, palpit, change in SOB or edema; labs showed BUN=22, Creat=1.4, K=3.3.Marland KitchenMarland Kitchen rec to take K20 Bid regularly! ~  12/12:  BP= 180/92 & medication compliance is suspect (didn't bring bottles to inspect); she is requesting change from Losartan back to [id number], labs ok & she is asked to f/u in 3-4 months to recheck BP (she never did) & consider f/u 2DEcho... ~  10/13:  34mo ROV & BP= 162/92 supposedly on Labet200Bid, Amlod10, Losar100, Demadex20-2Qam & K20-?Bid; ?not taking Demadex or KCl?; refilled meds & we decided to change Amlod10 to Minoxidil10 due to edema ==> called back w/ c/o rapid heartbeat on Minoxidil therefore stopped & we restarted Amlodipine5 & increased Labetolol200-2Bid (refer to Cards for their input & needs f/u 2DEcho)...  VENOUS INSUFFICIENCY (ICD-459.81) & EDEMA - she is supposed to follow a low sodium diet, elevate legs, & wears support hose when able...  continue Demadex 20mg - 2Qam & KCl... ~  12/12: she's been consuming way too much sodium & we reviewed a 2gm Na+ diet & wt reduction needed... ~  10/13:  ?if she's taking the Demadex, didn't brink bottles, lists 4 [employer] & most recent 2 haven't filled the diuretic...  OBESITY (ICD-278.00) - she has been counselled on a low carb, low fat, weight reducing diet... ~  weight 1/10 = 228# which is her peak and up 10# in the last yr... ~  weight 2/11 = 233# ~  weight 8/11 = 233#...  reminded of diet + exercise needed. ~  weight 12/11 = 236# ~  Weight 12/12 = 240#... rec to consider [facility] vs wt watchers etc... ~  Weight 10/13 = 239#  DIVERTICULOSIS OF COLON (ICD-562.10),  COLONIC POLYPS (ICD-211.3),  HEMORRHOIDS (ICD-455.6) - last colonoscopy 10/08 by DrStark showed divertics, hems, & several 3-65mm polyps= adenomatous w/ f/u planned in 5 yrs. ~  2/11:  she notes some constipation & is rec to take Miralax & Senakot-S. ~  8/11:  [place] written for hems... ~  10/13: she is due for another colonoscopy & is c/o pain across lower abd; Hg=11.4 and Sed=54; needs CT Abd&Pelvis> done 01/14/12> NAD... There was cardiomeg, & scarring at lung bases; left renal cyst, scat divertics w/o inflamm, s/o hyst, mild degen arthritis in spine, tortuous calcif Ao, NAD...  RENAL CYST (ICD-593.2) - incidentally noted left lower pole simple renal cyst...  DEGENERATIVE JOINT DISEASE (ICD-715.90) - known bilat knee arthritis w/ eval by DrNitka in 2003... bone-on-bone w/ cortisone shots attempted in 2005, and Synvisc in 2006... she takes MOBIC 7.5mg  Prn & TRAMADOL 50mg  Prn... ~  12/11: she has knee braces to help w/ ambulation; she is advised to f/u w/ Ortho & reconsider staged joint replacement operations... ~  12/12: we reviewed her severe OA situation w/ her; she will need bilat TKRs eventually & should proceed, but she is holding off, offered 2nd opinion at [facility]...  BACK PAIN, LUMBAR (ICD-724.2) - she has known  DDD... ~  Shara Blazing tried Lyrica w/ some improvement in symptoms.  VITAMIN D DEFICIENCY (ICD-268.9) - Vit D level 9/08 = 5... started on Vit D 50,000 u weekly, but she stopped this on her own after several months and switched to Vit D 1000 u daily... ~  labs 1/10 showed Vit D level = 22... rec to incr OTC Vit D to 2000 u daily (never did). ~  labs 2/11 showed Vit D level = 21... rec to get on the 2000 u daily! ~  Labs 12/12 showed Vit d level = 15... rec to restart VitD 50000 u weekly & stay on this! ~  Labs 10/13 showed Vit D level = 42... rec to continue Vit D 50K weekly...  NEUROPATHY (ICD-355.9)  Hx of ANEMIA (ICD-285.9) - hx anemia from heavy menses before her hysterectomy in 1989... subseq mild anemia- multifactorial in nature... ~  labs 9/08 showed Hg= 11.8 ~  labs 1/10 showed Hg= 12.1 ~  labs 2/11 showed Hg= 11.9 ~  labs 12/11 showed Hg= 11.0, MCV= 87 ~  Labs 12/12 showed Hg= 11.9, MCV= 85 ~  Labs 10/13 showed Hg= 11.4, MCV= 86  Health Maintenance - she is supposed to take ASA 81mg /d,  Calcium Bid,  MVI daily, & Vit D 2000 u daily... she refuses Flu shots.   Past Surgical History  Procedure Date  . [place] and right oopherectomy 1998    Dr. Nori Riis for fibroids and cyst   SHE DID NOT BRING MED BOTTLES TO THE OFFICE AS REQUESTED>> Outpatient Encounter Prescriptions as of 01/10/2012  Medication Sig Dispense Refill  . aspirin 81 MG tablet Take 81 mg by mouth daily.        . calcium carbonate (OS-CAL - DOSED IN MG OF ELEMENTAL CALCIUM) 1250 MG tablet Take 2 tablets by mouth daily.      . hydrocortisone (ANUSOL-HC) 2.5 % rectal cream Place 1 application rectally as needed.        . labetalol (NORMODYNE) 200 MG tablet Take 1 tablet (200 mg total) by mouth 2 (two) times daily.  120 tablet  11  . losartan (COZAAR) 100 MG tablet TAKE 1 TABLET BY MOUTH EVERY DAY  30 tablet  1  .  meloxicam (MOBIC) 7.5 MG tablet Take 7.5 mg by mouth daily. As needed for arthritis pain       . Multiple  Vitamins-Minerals (MULTIVITAMIN PO) Take 1 tablet by mouth daily.        . potassium chloride SA (K-DUR,KLOR-CON) 20 MEQ tablet Take 20 mEq by mouth 2 (two) times daily.        Marland Kitchen torsemide (DEMADEX) 20 MG tablet Take 40 mg by mouth daily.        . traMADol (ULTRAM) 50 MG tablet Take 50 mg by mouth every 6 (six) hours as needed. Maximum dose= 8 tablets per day       . Vitamin D, Ergocalciferol, (DRISDOL) 50000 UNITS CAPS Take 1 capsule (50,000 Units total) by mouth every 7 (seven) days.  4 capsule  11  . minoxidil (LONITEN) 10 MG tablet Take 1 tablet (10 mg total) by mouth daily.  30 tablet  11  . DISCONTD: amLODipine (NORVASC) 10 MG tablet Take 1 tablet (10 mg total) by mouth daily.  30 tablet  11  . DISCONTD: cholecalciferol (VITAMIN D) 1000 UNITS tablet Take 2,000 Units by mouth daily.          Allergies  Allergen Reactions  . Naproxen     REACTION: swelling    Current Medications, Allergies, Past Medical History, Past Surgical History, Family History, and Social History were reviewed in [employer] record.    Review of Systems         See HPI - all other systems neg except as noted...  The patient complains of dyspnea on exertion, muscle weakness, and difficulty walking.  The patient denies anorexia, fever, weight loss, weight gain, vision loss, decreased hearing, hoarseness, chest pain, syncope, peripheral edema, prolonged cough, headaches, hemoptysis, abdominal pain, melena, hematochezia, severe indigestion/heartburn, hematuria, incontinence, suspicious skin lesions, transient blindness, depression, unusual weight change, abnormal bleeding, enlarged lymph nodes, and angioedema.   Objective:   Physical Exam      WD, Overweight, 66 y/o BF in NAD... GENERAL:  Alert & oriented; pleasant & cooperative... HEENT:  [place]/AT, EOM-wnl, PERRLA, EACs-clear, TMs-wnl, NOSE-clear, THROAT-clear & wnl. NECK:  Supple w/ fairROM; no JVD; normal carotid impulses w/o bruits; palp  thyroid w/o nodules; no lymphadenopathy. CHEST:  Clear to P & A; without wheezes/ rales/ or rhonchi heard... HEART:  Regular Rhythm; without murmurs/ rubs/ or gallops detected... ABDOMEN:  Obese, soft & nontender; normal bowel sounds; no organomegaly or masses detected. EXT:  mod-severe arthritic changes; no varicose veins/ +venous insuffic/ 2+edema. NEURO:  CN's intact; motor testing normal; sensory testing normal; gait abn due to severe arthritis. DERM:  No lesions noted; no rash etc...  RADIOLOGY DATA:  Reviewed in the EPIC EMR & discussed w/ the patient...  LABORATORY DATA:  Reviewed in the EPIC EMR & discussed w/ the patient...   Assessment & Plan:    HBP>  Hx severe HBP & I believe she has become complacent and noncompliant w/ her 4 med regimen; once again she did not brink bottles to office & she lists 4 diff pharms!  We decided to refill Labetolol 200mg -2Bid, Losartan 100mg /d, Demadex 20mg -2daily, K20/d, and change Norvasc10 to Minoxidil10/d;  ROV 68mo w/ all med bottles to review...  VI/ Edema>  She MUST comply w/ low sodium restriction & canned soups won't cut it; she was given another 2gm Na+ diet sheet; reminded to elevate legs & wear support hose, and take diuretic daily...   Obesity>  She's been totally ineffective on diet/  exercise; needs diet counseling wt watchers or something similar for help & she will consider her options and make her choice...  GI> Divertics, Polyps>  Stable, using [place] as needed; CT Abd/Pelvis was neg w/o acute changes & we will refer to drStark for further GI eval...  DJD, LBP>  This is one of her most signif issues- severe DJD knees do not allow for suffic exercise/ activity & wt reduction is difficult; advised to reconsider TKRs because without it she will be wheelchair bound & immobile; offered 2nd opinion at [facility] & she will decide...  Vit D defic>  Severe Vit D defic, age 58, immobile, etc... Improved on Vit D 50K weekly, along w/ Calcium, MVI,  and needs BMD measurement here...  Neuropathy>  Aware, likely related to her DDD but she states not that much back discomfort at this point...  Anemia>  Hg is approx stable at 11.4 at present...   Patient's Medications  New Prescriptions   MINOXIDIL (LONITEN) 10 MG TABLET    Take 1 tablet (10 mg total) by mouth daily.  Previous Medications   ASPIRIN 81 MG TABLET    Take 81 mg by mouth daily.     CALCIUM CARBONATE (OS-CAL - DOSED IN MG OF ELEMENTAL CALCIUM) 1250 MG TABLET    Take 2 tablets by mouth daily.   HYDROCORTISONE (ANUSOL-HC) 2.5 % RECTAL CREAM    Place 1 application rectally as needed.     LABETALOL (NORMODYNE) 200 MG TABLET    Take 1 tablet (200 mg total) by mouth 2 (two) times daily.   LOSARTAN (COZAAR) 100 MG TABLET    TAKE 1 TABLET BY MOUTH EVERY DAY   MELOXICAM (MOBIC) 7.5 MG TABLET    Take 7.5 mg by mouth daily. As needed for arthritis pain    MULTIPLE VITAMINS-MINERALS (MULTIVITAMIN PO)    Take 1 tablet by mouth daily.     POTASSIUM CHLORIDE SA (K-DUR,KLOR-CON) 20 MEQ TABLET    Take 20 mEq by mouth 2 (two) times daily.     TORSEMIDE (DEMADEX) 20 MG TABLET    Take 40 mg by mouth daily.     TRAMADOL (ULTRAM) 50 MG TABLET    Take 50 mg by mouth every 6 (six) hours as needed. Maximum dose= 8 tablets per day    VITAMIN D, ERGOCALCIFEROL, (DRISDOL) 50000 UNITS CAPS    Take 1 capsule (50,000 Units total) by mouth every 7 (seven) days.  Modified Medications   No medications on file  Discontinued Medications   AMLODIPINE (NORVASC) 10 MG TABLET    Take 1 tablet (10 mg total) by mouth daily.   CHOLECALCIFEROL (VITAMIN D) 1000 UNITS TABLET    Take 2,000 Units by mouth daily.

## 2012-01-13 ENCOUNTER — Telehealth: Payer: Self-pay | Admitting: Pulmonary Disease

## 2012-01-13 ENCOUNTER — Other Ambulatory Visit: Payer: Self-pay | Admitting: Pulmonary Disease

## 2012-01-13 MED ORDER — MELOXICAM 7.5 MG PO TABS: 7.5000 mg | ORAL_TABLET | Freq: Every day | ORAL | Status: DC

## 2012-01-13 MED ORDER — TRAMADOL HCL 50 MG PO TABS: 50.0000 mg | ORAL_TABLET | Freq: Four times a day (QID) | ORAL | Status: DC | PRN

## 2012-01-13 NOTE — Telephone Encounter (Signed)
Called and lmomtcb for the pt.

## 2012-01-13 NOTE — Telephone Encounter (Signed)
Pt called me back and i spoke with her about her lab results per SN.  Pt voiced her understanding and nothing further is needed.

## 2012-01-13 NOTE — Telephone Encounter (Signed)
Notes Recorded by Noralee Space, MD on 01/11/2012 at 10:14 AM Please notify patient>  FLP is at goals on diet alone, not a problem... Chems show lowK=3.3> needs Demadex20-2Qam & K20-one daily; & sl incr LFTs- likely steatosis & must lose weight! CBC shows mild anemia, stable, rec 1-a-day w/ Fe... Thyroid, VitD, BNP> all look good, continue VitD 50K weekly... Urine is clear...   lmomtcb x1

## 2012-01-14 ENCOUNTER — Other Ambulatory Visit: Payer: Medicare Other

## 2012-01-14 ENCOUNTER — Ambulatory Visit (INDEPENDENT_AMBULATORY_CARE_PROVIDER_SITE_OTHER)
Admission: RE | Admit: 2012-01-14 | Discharge: 2012-01-14 | Disposition: A | Discharge status: Home / Self Care | Payer: Medicare Other | Source: Ambulatory Visit | Attending: Pulmonary Disease | Admitting: Pulmonary Disease

## 2012-01-14 DIAGNOSIS — R1030 Lower abdominal pain, unspecified: Secondary | ICD-10-CM

## 2012-01-14 DIAGNOSIS — R109 Unspecified abdominal pain: Secondary | ICD-10-CM

## 2012-01-14 MED ORDER — IOHEXOL 300 MG/ML  SOLN
100.0000 mL | Freq: Once | INTRAMUSCULAR | Status: AC | PRN
  Administered 2012-01-14: 100 mL via INTRAVENOUS

## 2012-01-16 ENCOUNTER — Ambulatory Visit (INDEPENDENT_AMBULATORY_CARE_PROVIDER_SITE_OTHER): Payer: Medicare Other | Admitting: Gastroenterology

## 2012-01-16 ENCOUNTER — Other Ambulatory Visit (INDEPENDENT_AMBULATORY_CARE_PROVIDER_SITE_OTHER): Payer: Medicare Other

## 2012-01-16 ENCOUNTER — Encounter: Payer: Self-pay | Admitting: Gastroenterology

## 2012-01-16 VITALS — BP 152/80 | HR 72 | Ht 66.0 in | Wt 243.0 lb

## 2012-01-16 DIAGNOSIS — K5900 Constipation, unspecified: Secondary | ICD-10-CM

## 2012-01-16 DIAGNOSIS — K9089 Other intestinal malabsorption: Secondary | ICD-10-CM

## 2012-01-16 DIAGNOSIS — R7989 Other specified abnormal findings of blood chemistry: Secondary | ICD-10-CM

## 2012-01-16 DIAGNOSIS — Z86010 Personal history of colonic polyps: Secondary | ICD-10-CM

## 2012-01-16 DIAGNOSIS — R109 Unspecified abdominal pain: Secondary | ICD-10-CM

## 2012-01-16 DIAGNOSIS — K909 Intestinal malabsorption, unspecified: Secondary | ICD-10-CM

## 2012-01-16 DIAGNOSIS — R52 Pain, unspecified: Secondary | ICD-10-CM

## 2012-01-16 DIAGNOSIS — D649 Anemia, unspecified: Secondary | ICD-10-CM

## 2012-01-16 MED ORDER — PEG-KCL-NACL-NASULF-NA ASC-C 100 G PO SOLR: 1.0000 | Freq: Once | ORAL | Status: DC

## 2012-01-16 NOTE — Progress Notes (Signed)
History of Present Illness: This is a 66 year old female with a history of adenomatous colon polyps. She relates mild constipation with occasional associated lower abdominal crampy pain. She has a mild anemia with hemoglobin of 11.4. Mild elevation of transaminases. Last colonoscopy 2008. Abdominal/pelvic CT scan performed last week was unremarkable except for diverticulosis prior hysterectomy. Denies weight loss, diarrhea, change in stool caliber, melena, hematochezia, nausea, vomiting, dysphagia, reflux symptoms, chest pain.  Review of Systems: Pertinent positive and negative review of systems were noted in the above HPI section. All other review of systems were otherwise negative.  Current Medications, Allergies, Past Medical History, Past Surgical History, Family History and Social History were reviewed in [employer] record.  Physical Exam: General: Well developed , well nourished, no acute distress Head: Normocephalic and atraumatic Eyes:  sclerae anicteric, EOMI Ears: Normal auditory acuity Mouth: No deformity or lesions Neck: Supple, no masses or thyromegaly Lungs: Clear throughout to auscultation Heart: Regular rate and rhythm; no murmurs, rubs or bruits Abdomen: Soft, non tender and non distended. No masses, hepatosplenomegaly or hernias noted. Normal Bowel sounds Rectal: Deferred to colonoscopy Musculoskeletal: Symmetrical with no gross deformities  Skin: No lesions on visible extremities Pulses:  Normal pulses noted Extremities: No clubbing, cyanosis, edema or deformities noted Neurological: Alert oriented x 4, grossly nonfocal Cervical Nodes:  No significant cervical adenopathy Inguinal Nodes: No significant inguinal adenopathy Psychological:  Alert and cooperative. Normal mood and affect  Assessment and Recommendations:  1. Personal history of adenomatous colon polyps. She is due for surveillance colonoscopy. The risks, benefits, and alternatives to  colonoscopy with possible biopsy and possible polypectomy were discussed with the patient and they consent to proceed.   2. Mild constipation. Increase dietary fiber and water intake. Begin Colace daily. If his effective add MiraLax daily.  3. Mild lower bowel pain. Likely related to constipation.  4. Chronic anemia. Obtain standard studies.  5. Minimally elevated transaminases. Repeat liver function tests in 2 months.

## 2012-01-16 NOTE — Patient Instructions (Addendum)
Your physician has requested that you go to the basement for the following lab work before leaving today: Anemia panel.  You have been scheduled for a colonoscopy with propofol. Please follow written instructions given to you at your visit today.  Please pick up your prep kit at the pharmacy within the next 1-3 days. If you use inhalers (even only as needed), please bring them with you on the day of your procedure.  Start Colace over the counter once daily for constipation. If this does not help then take Miralax over the counter mixing 17 grams in 8 ox of water daily.   High-Fiber Diet Fiber is found in fruits, vegetables, and grains. A high-fiber diet encourages the addition of more whole grains, legumes, fruits, and vegetables in your diet. The recommended amount of fiber for adult males is 38 g per day. For adult females, it is 25 g per day. Pregnant and lactating women should get 28 g of fiber per day. If you have a digestive or bowel problem, ask your caregiver for advice before adding high-fiber foods to your diet. Eat a variety of high-fiber foods instead of only a select few type of foods.  PURPOSE  To increase stool bulk.  To make bowel movements more regular to prevent constipation.  To lower cholesterol.  To prevent overeating. WHEN IS THIS DIET USED?  It may be used if you have constipation and hemorrhoids.  It may be used if you have uncomplicated diverticulosis (intestine condition) and irritable bowel syndrome.  It may be used if you need help with weight management.  It may be used if you want to add it to your diet as a protective measure against atherosclerosis, diabetes, and cancer. SOURCES OF FIBER  Whole-grain breads and cereals.  Fruits, such as apples, oranges, bananas, berries, prunes, and pears.  Vegetables, such as green peas, carrots, sweet potatoes, beets, broccoli, cabbage, spinach, and artichokes.  Legumes, such split peas, soy,  lentils.  Almonds. FIBER CONTENT IN FOODS Starches and Grains / Dietary Fiber (g)  Cheerios, 1 cup / 3 g  Corn Flakes cereal, 1 cup / 0.7 g  Rice crispy treat cereal, 1 cup / 0.3 g  Instant oatmeal (cooked),  cup / 2 g  Frosted wheat cereal, 1 cup / 5.1 g  Brown, long-grain rice (cooked), 1 cup / 3.5 g  White, long-grain rice (cooked), 1 cup / 0.6 g  Enriched macaroni (cooked), 1 cup / 2.5 g Legumes / Dietary Fiber (g)  Baked beans (canned, plain, or vegetarian),  cup / 5.2 g  Kidney beans (canned),  cup / 6.8 g  Pinto beans (cooked),  cup / 5.5 g Breads and Crackers / Dietary Fiber (g)  Plain or honey graham crackers, 2 squares / 0.7 g  Saltine crackers, 3 squares / 0.3 g  Plain, salted pretzels, 10 pieces / 1.8 g  Whole-wheat bread, 1 slice / 1.9 g  White bread, 1 slice / 0.7 g  Raisin bread, 1 slice / 1.2 g  Plain bagel, 3 oz / 2 g  Flour tortilla, 1 oz / 0.9 g  Corn tortilla, 1 small / 1.5 g  Hamburger or hotdog bun, 1 small / 0.9 g Fruits / Dietary Fiber (g)  Apple with skin, 1 medium / 4.4 g  Sweetened applesauce,  cup / 1.5 g  Banana,  medium / 1.5 g  Grapes, 10 grapes / 0.4 g  Orange, 1 small / 2.3 g  Raisin, 1.5 oz / 1.6  g  Melon, 1 cup / 1.4 g Vegetables / Dietary Fiber (g)  Green beans (canned),  cup / 1.3 g  Carrots (cooked),  cup / 2.3 g  Broccoli (cooked),  cup / 2.8 g  Peas (cooked),  cup / 4.4 g  Mashed potatoes,  cup / 1.6 g  Lettuce, 1 cup / 0.5 g  Corn (canned),  cup / 1.6 g  Tomato,  cup / 1.1 g Document Released: 03/25/2005 Document Revised: 09/24/2011 Document Reviewed: 06/27/2011 [facility] Patient Information 2013 [place], [place].

## 2012-01-20 ENCOUNTER — Telehealth: Payer: Self-pay | Admitting: Pulmonary Disease

## 2012-01-20 MED ORDER — HYDROCOD POLST-CHLORPHEN POLST 10-8 MG/5ML PO LQCR: 5.0000 mL | Freq: Two times a day (BID) | ORAL | Status: DC

## 2012-01-20 NOTE — Telephone Encounter (Signed)
lmomtcb x1 

## 2012-01-20 NOTE — Telephone Encounter (Signed)
Pt returned call. Kathleen W Perdue  

## 2012-01-20 NOTE — Telephone Encounter (Signed)
[email or web address].Jennifer Castillo, CMA  

## 2012-01-20 NOTE — Telephone Encounter (Signed)
Per SN---I agree to stop it and check BP at home.   Ok for the tussionex  #4 oz  1 tsp every 12 hours prn cough  SN recs that the pt take these regulary every day:  Amlodipine 10 mg daily Labetalol 200 mg  1 po bid  Losartan 100 mg daily  demadex 20 mg  2 every morning Potassium 20 mg  1 po bid  Watch BP at home and keep her follow up appt with SN.  Bring all med bottles to her next follow up with Dr. Lenna Gilford.  thanks

## 2012-01-20 NOTE — Telephone Encounter (Signed)
I spoke with pt and she stated she stopped the minoxidil due to rapid heart beat and swelling in her legs over the weekend. She stated she read this is side effects of this medications and the pharmacists advised pt to stop this medication. Requesting alternatives. Also she c/o cough w/ clear phlem, wheezing, chest tx x couple days. She is wanting tussionex called in for this. Please advise Dr. Lenna Gilford thanks  Allergies  Allergen Reactions  . Naproxen     REACTION: swelling

## 2012-01-20 NOTE — Telephone Encounter (Signed)
Pt called me back and she is aware of SN recs  And pt is aware to bring in her medicine bottles to her next ov.  tussionex has been called to the pharmacy and pt is aware.  Nothing further is needed.

## 2012-01-21 ENCOUNTER — Other Ambulatory Visit: Payer: Self-pay | Admitting: Obstetrics & Gynecology

## 2012-01-21 DIAGNOSIS — R920 Mammographic microcalcification found on diagnostic imaging of breast: Secondary | ICD-10-CM

## 2012-01-22 ENCOUNTER — Other Ambulatory Visit: Payer: Self-pay | Admitting: Pulmonary Disease

## 2012-01-22 DIAGNOSIS — R609 Edema, unspecified: Secondary | ICD-10-CM

## 2012-02-03 ENCOUNTER — Telehealth: Payer: Self-pay | Admitting: Pulmonary Disease

## 2012-02-03 DIAGNOSIS — I10 Essential (primary) hypertension: Secondary | ICD-10-CM

## 2012-02-03 NOTE — Telephone Encounter (Signed)
Per SN----  Add back the amlodipine  5 mg daily,  Labetalol 200 mg  2 po bid, continue  losartan 100 mg daily, demadex 20 mg  2 po every am, continue potassium 1 po bid, and pt needs appt with cardiology for HBP for consult.  i have called and spoke with pt and lmom to make her aware and told her to call back for any questions or concerns. Med list has been updated.

## 2012-02-03 NOTE — Telephone Encounter (Signed)
lmomtcb for the pt to discuss her BP

## 2012-02-03 NOTE — Telephone Encounter (Signed)
Pt called me back and she stated that  Her BP has been running high---GYN office the other day was 210/110 and today it is 160/84.  Pt stated that today she has a headache that aspirin is not helping with this.  Pt feels that she needs more medication to help with this.  Pt stated that she has been taking her BP meds regularly.  She is scheduled for colonoscopy in 2 weeks.  SN please advise.  thanks

## 2012-02-04 ENCOUNTER — Other Ambulatory Visit: Payer: Self-pay | Admitting: Pulmonary Disease

## 2012-02-04 ENCOUNTER — Ambulatory Visit
Admission: RE | Admit: 2012-02-04 | Discharge: 2012-02-04 | Disposition: A | Discharge status: Home / Self Care | Payer: Medicare Other | Source: Ambulatory Visit | Attending: Obstetrics & Gynecology | Admitting: Obstetrics & Gynecology

## 2012-02-04 DIAGNOSIS — R920 Mammographic microcalcification found on diagnostic imaging of breast: Secondary | ICD-10-CM

## 2012-02-04 MED ORDER — AMLODIPINE BESYLATE 5 MG PO TABS: 5.0000 mg | ORAL_TABLET | Freq: Every day | ORAL | Status: DC

## 2012-02-04 MED ORDER — LABETALOL HCL 200 MG PO TABS: 400.0000 mg | ORAL_TABLET | Freq: Two times a day (BID) | ORAL | Status: DC

## 2012-02-18 ENCOUNTER — Encounter: Payer: Self-pay | Admitting: Gastroenterology

## 2012-02-18 ENCOUNTER — Ambulatory Visit (AMBULATORY_SURGERY_CENTER): Payer: Medicare Other | Admitting: Gastroenterology

## 2012-02-18 VITALS — BP 190/102 | HR 76 | Temp 97.7°F | Resp 19 | Ht 66.0 in | Wt 243.0 lb

## 2012-02-18 DIAGNOSIS — R109 Unspecified abdominal pain: Secondary | ICD-10-CM

## 2012-02-18 DIAGNOSIS — Z860100 Personal history of colon polyps, unspecified: Secondary | ICD-10-CM

## 2012-02-18 DIAGNOSIS — Z1211 Encounter for screening for malignant neoplasm of colon: Secondary | ICD-10-CM

## 2012-02-18 DIAGNOSIS — R7989 Other specified abnormal findings of blood chemistry: Secondary | ICD-10-CM

## 2012-02-18 DIAGNOSIS — D126 Benign neoplasm of colon, unspecified: Secondary | ICD-10-CM

## 2012-02-18 DIAGNOSIS — K5900 Constipation, unspecified: Secondary | ICD-10-CM

## 2012-02-18 MED ORDER — SODIUM CHLORIDE 0.9 % IV SOLN: 500.0000 mL | INTRAVENOUS | Status: DC

## 2012-02-18 NOTE — Progress Notes (Signed)
Pt did not take all of her b/p meds today.  I advised her to take her other b/ when she gets home.  She said she would. Maw  Patient did not experience any of the following events: a burn prior to discharge; a fall within the facility; wrong site/side/patient/procedure/implant event; or a hospital transfer or hospital admission upon discharge from the facility. [phone]) Patient did not have preoperative order for IV antibiotic SSI prophylaxis. [phone])

## 2012-02-18 NOTE — Patient Instructions (Addendum)
Handouts were given to your care partner on diverticulosis, high fiber diet, polyps and hemorrhoids.  Please hold aspirin, aspirin products ans any anti-inflammatory medications for 2 weeks,  You may resume your other current medications today.  Please call if any questions or concerns.    YOU HAD AN ENDOSCOPIC PROCEDURE TODAY AT [facility]: Refer to the procedure report that was given to you for any specific questions about what was found during the examination.  If the procedure report does not answer your questions, please call your gastroenterologist to clarify.  If you requested that your care partner not be given the details of your procedure findings, then the procedure report has been included in a sealed envelope for you to review at your convenience later.  YOU SHOULD EXPECT: Some feelings of bloating in the abdomen. Passage of more gas than usual.  Walking can help get rid of the air that was put into your GI tract during the procedure and reduce the bloating. If you had a lower endoscopy (such as a colonoscopy or flexible sigmoidoscopy) you may notice spotting of blood in your stool or on the toilet paper. If you underwent a bowel prep for your procedure, then you may not have a normal bowel movement for a few days.  DIET: Your first meal following the procedure should be a light meal and then it is ok to progress to your normal diet.  A half-sandwich or bowl of soup is an example of a good first meal.  Heavy or fried foods are harder to digest and may make you feel nauseous or bloated.  Likewise meals heavy in dairy and vegetables can cause extra gas to form and this can also increase the bloating.  Drink plenty of fluids but you should avoid alcoholic beverages for 24 hours.  ACTIVITY: Your care partner should take you home directly after the procedure.  You should plan to take it easy, moving slowly for the rest of the day.  You can resume normal activity the day after the  procedure however you should NOT DRIVE or use heavy machinery for 24 hours (because of the sedation medicines used during the test).    SYMPTOMS TO REPORT IMMEDIATELY: A gastroenterologist can be reached at any hour.  During normal business hours, 8:30 AM to 5:00 PM Monday through Friday, call [phone].  After hours and on weekends, please call the GI answering service at [phone] who will take a message and have the physician on call contact you.   Following lower endoscopy (colonoscopy or flexible sigmoidoscopy):  Excessive amounts of blood in the stool  Significant tenderness or worsening of abdominal pains  Swelling of the abdomen that is new, acute  Fever of 100F or higher    FOLLOW UP: If any biopsies were taken you will be contacted by phone or by letter within the next 1-3 weeks.  Call your gastroenterologist if you have not heard about the biopsies in 3 weeks.  Our staff will call the home number listed on your records the next business day following your procedure to check on you and address any questions or concerns that you may have at that time regarding the information given to you following your procedure. This is a courtesy call and so if there is no answer at the home number and we have not heard from you through the emergency physician on call, we will assume that you have returned to your regular daily activities without incident.  SIGNATURES/CONFIDENTIALITY: You and/or your care partner have signed paperwork which will be entered into your electronic medical record.  These signatures attest to the fact that that the information above on your After Visit Summary has been reviewed and is understood.  Full responsibility of the confidentiality of this discharge information lies with you and/or your care-partner.

## 2012-02-18 NOTE — Progress Notes (Signed)
No complaints noted in the recovery room. Maw   

## 2012-02-18 NOTE — Op Note (Addendum)
[place]  [address] [place], [address]   COLONOSCOPY PROCEDURE REPORT  PATIENT: [name], [name]  MR#: [id number] BIRTHDATE: Aug 08, 1945 , 1  yrs. old GENDER: Female ENDOSCOPIST: Ladene Artist, MD, [facility] PROCEDURE DATE:  02/18/2012 PROCEDURE:   Colonoscopy with snare polypectomy and Colonoscopy with biopsy ASA CLASS:   Class III INDICATIONS: personal history of adenomatous colon polyps: 2003, 2008. MEDICATIONS: MAC sedation, administered by CRNA and propofol (Diprivan) 150mg  IV DESCRIPTION OF PROCEDURE:   After the risks benefits and alternatives of the procedure were thoroughly explained, informed consent was obtained.  A digital rectal exam revealed moderate external hemorrhoids.   The LB CF-[id number]  endoscope was introduced through the anus and advanced to the cecum, which was identified by both the appendix and ileocecal valve. No adverse events experienced.   The quality of the prep was excellent, using MoviPrep  The instrument was then slowly withdrawn as the colon was fully examined.  COLON FINDINGS: A pedunculated polyp measuring 12 mm in size was found in the ascending colon.  A polypectomy was performed using snare cautery.  The resection was complete and the polyp tissue was completely retrieved.   A sessile polyp measuring 4 mm in size was found in the sigmoid colon.  A polypectomy was performed with cold forceps.  The resection was complete and the polyp tissue was completely retrieved.   Moderate diverticulosis was noted in the sigmoid colon.   The colon was otherwise normal.  There was no diverticulosis, inflammation, polyps or cancers unless previously stated.  Retroflexed views revealed no abnormalities. The time to cecum=3 minutes 34 seconds.  Withdrawal time=13 minutes 16 seconds. The scope was withdrawn and the procedure completed. COMPLICATIONS: There were no complications.  ENDOSCOPIC IMPRESSION: 1.   Pedunculated polyp  measuring 12 mm in the ascending colon; polypectomy  performed using snare cautery 2.   Sessile polyp measuring 4 mm  in the sigmoid colon; polypectomy performed with cold forceps 3.   Moderate diverticulosis was noted in the sigmoid colon 4.   Moderate external hemorrhoids  RECOMMENDATIONS: 1.  Await pathology results 2.  Hold aspirin, aspirin products, and anti-inflammatory medication for 2 weeks. 3.  Repeat Colonoscopy in 3 years if the larger polyps is adenomatous, otherwise 5 years.   eSigned:  Ladene Artist, MD, [facility] 02/18/2012 2:42 PM Revised: 02/18/2012 2:42 PM

## 2012-02-19 ENCOUNTER — Telehealth: Payer: Self-pay | Admitting: *Deleted

## 2012-02-19 NOTE — Telephone Encounter (Signed)
Left message on number given in admitting yesterday per pt. ewm

## 2012-02-24 ENCOUNTER — Ambulatory Visit (INDEPENDENT_AMBULATORY_CARE_PROVIDER_SITE_OTHER): Payer: Medicare Other | Admitting: Cardiovascular Disease

## 2012-02-24 ENCOUNTER — Encounter: Payer: Self-pay | Admitting: Cardiovascular Disease

## 2012-02-24 ENCOUNTER — Institutional Professional Consult (permissible substitution): Payer: Medicare Other | Admitting: Cardiovascular Disease

## 2012-02-24 ENCOUNTER — Telehealth: Payer: Self-pay | Admitting: *Deleted

## 2012-02-24 ENCOUNTER — Encounter: Payer: Self-pay | Admitting: Gastroenterology

## 2012-02-24 VITALS — BP 183/93 | HR 81 | Ht 65.0 in | Wt 233.0 lb

## 2012-02-24 DIAGNOSIS — R609 Edema, unspecified: Secondary | ICD-10-CM

## 2012-02-24 DIAGNOSIS — R011 Cardiac murmur, unspecified: Secondary | ICD-10-CM

## 2012-02-24 DIAGNOSIS — I872 Venous insufficiency (chronic) (peripheral): Secondary | ICD-10-CM

## 2012-02-24 DIAGNOSIS — I10 Essential (primary) hypertension: Secondary | ICD-10-CM

## 2012-02-24 NOTE — Progress Notes (Signed)
Patient ID: [name], female   DOB: [birth year], 66 y.o.   MRN: [id number] 66 yo with long standing HTN managed by Dr Lenna Gilford.  She has periods of noncompliance and  Lots of salt in her diet.  Reviewed his notes and he has made mulitple changes recently. Including stopping norvasc, and putting her on minioxidil which she had to stop for palpitations.  She has chronic LE edema and does not where her support hose.  Lack of med compliance has been confirmed by calling her pharmacy.  She has no chest pain.  No recent evaluation of LVH or EF.  Activity limited by knee arthritis and she will need to have TKR in future.  Recent Cr and BNP are normal.    ROS: Denies fever, malais, weight loss, blurry vision, decreased visual acuity, cough, sputum, SOB, hemoptysis, pleuritic pain, palpitaitons, heartburn, abdominal pain, melena, lower extremity edema, claudication, or rash.  All other systems reviewed and negative   General: Affect appropriate Obese black female HEENT: normal Neck supple with no adenopathy JVP normal no bruits no thyromegaly Lungs clear with no wheezing and good diaphragmatic motion Heart:  S1/S2SEM murmur,rub, gallop or click PMI normal Abdomen: benighn, BS positve, no tenderness, no AAA no bruit.  No HSM or HJR Distal pulses intact with no bruits Plus two bilateral edema Neuro non-focal Skin warm and dry No muscular weakness  Medications Current Outpatient Prescriptions  Medication Sig Dispense Refill  . amLODipine (NORVASC) 5 MG tablet Take 1 tablet (5 mg total) by mouth daily.  30 tablet  11  . aspirin 81 MG tablet Take 81 mg by mouth daily.        . calcium carbonate (OS-CAL - DOSED IN MG OF ELEMENTAL CALCIUM) 1250 MG tablet Take 2 tablets by mouth daily.      . clobetasol ointment (TEMOVATE) 0.05 %       . hydrocortisone (ANUSOL-HC) 2.5 % rectal cream Place 1 application rectally as needed.        . labetalol (NORMODYNE) 200 MG tablet Take 2 tablets (400 mg total) by mouth  2 (two) times daily.  120 tablet  6  . losartan (COZAAR) 100 MG tablet TAKE 1 TABLET BY MOUTH EVERY DAY  30 tablet  1  . meloxicam (MOBIC) 7.5 MG tablet Take 1 tablet (7.5 mg total) by mouth daily. As needed for arthritis pain  30 tablet  11  . Multiple Vitamins-Minerals (MULTIVITAMIN PO) Take 1 tablet by mouth daily.        . potassium chloride SA (K-DUR,KLOR-CON) 20 MEQ tablet Take 20 mEq by mouth 2 (two) times daily.        Marland Kitchen torsemide (DEMADEX) 20 MG tablet Take 40 mg by mouth daily.        . traMADol (ULTRAM) 50 MG tablet Take 1 tablet (50 mg total) by mouth every 6 (six) hours as needed. Maximum dose= 8 tablets per day  90 tablet  6  . Vitamin D, Ergocalciferol, (DRISDOL) 50000 UNITS CAPS Take 1 capsule (50,000 Units total) by mouth every 7 (seven) days.  4 capsule  11    Allergies Minoxidil and Naproxen  Family History: Family History  Problem Relation Age of Onset  . Diabetes Mother   . Colon polyps Mother   . Colon cancer Neg Hx     Social History: History   Social History  . Marital Status: Married    Spouse Name: charles    Number of Children: 1  . Years of  Education: N/A   Occupational History  . retired from [employer]    Social History Main Topics  . Smoking status: Former Smoker    Quit date: 04/08/1982  . Smokeless tobacco: Never Used  . Alcohol Use: No  . Drug Use: No  . Sexually Active: Not on file   Other Topics Concern  . Not on file   Social History Narrative   Daily caffeine     Electrocardiogram:  Assessment and Plan

## 2012-02-24 NOTE — Assessment & Plan Note (Signed)
AV sclerosis murmur needs echo for HTN and edema as well

## 2012-02-24 NOTE — Patient Instructions (Signed)
Your physician recommends that you schedule a follow-up appointment in: AS NEEDED Your physician recommends that you continue on your current medications as directed. Please refer to the Current Medication list given to you today. Your physician has requested that you have an echocardiogram. Echocardiography is a painless test that uses sound waves to create images of your heart. It provides your doctor with information about the size and shape of your heart and how well your heart's chambers and valves are working. This procedure takes approximately one hour. There are no restrictions for this procedure. DX EDEMA

## 2012-02-24 NOTE — Assessment & Plan Note (Signed)
Given HTN and edema will check echo for RV/LV function r/o pulmonary hypertension

## 2012-02-24 NOTE — Telephone Encounter (Signed)
CALLED  AND LVM   PT  TO CALL BACK  WANTED TO  MOVE APPT  TO 3:15 .Adonis Housekeeper

## 2012-02-24 NOTE — Assessment & Plan Note (Signed)
Probably has more lymphedema.  Continue diuretics and compliance with low sodium diet and support hose stressed

## 2012-02-24 NOTE — Assessment & Plan Note (Signed)
I told her this should be managed by Dr Lenna Gilford who has followed her for 30 years and made multiple changes to her meds.  Still has option of clonidine and hydralazine.  Compliance , obesity and salt in diet are major issues

## 2012-02-27 ENCOUNTER — Other Ambulatory Visit: Payer: Self-pay | Admitting: Pulmonary Disease

## 2012-03-11 ENCOUNTER — Ambulatory Visit (HOSPITAL_COMMUNITY): Payer: Medicare Other | Attending: Cardiovascular Disease

## 2012-03-11 DIAGNOSIS — I359 Nonrheumatic aortic valve disorder, unspecified: Secondary | ICD-10-CM | POA: Insufficient documentation

## 2012-03-11 DIAGNOSIS — R609 Edema, unspecified: Secondary | ICD-10-CM | POA: Insufficient documentation

## 2012-03-11 DIAGNOSIS — E669 Obesity, unspecified: Secondary | ICD-10-CM | POA: Insufficient documentation

## 2012-03-11 DIAGNOSIS — I517 Cardiomegaly: Secondary | ICD-10-CM | POA: Insufficient documentation

## 2012-03-11 DIAGNOSIS — Z87891 Personal history of nicotine dependence: Secondary | ICD-10-CM | POA: Insufficient documentation

## 2012-03-11 DIAGNOSIS — I059 Rheumatic mitral valve disease, unspecified: Secondary | ICD-10-CM | POA: Insufficient documentation

## 2012-03-11 DIAGNOSIS — R011 Cardiac murmur, unspecified: Secondary | ICD-10-CM

## 2012-03-11 DIAGNOSIS — I872 Venous insufficiency (chronic) (peripheral): Secondary | ICD-10-CM | POA: Insufficient documentation

## 2012-03-11 DIAGNOSIS — I10 Essential (primary) hypertension: Secondary | ICD-10-CM | POA: Insufficient documentation

## 2012-03-11 DIAGNOSIS — G589 Mononeuropathy, unspecified: Secondary | ICD-10-CM | POA: Insufficient documentation

## 2012-03-11 NOTE — Progress Notes (Signed)
Echocardiogram performed.  

## 2012-03-12 ENCOUNTER — Telehealth: Payer: Self-pay | Admitting: Cardiovascular Disease

## 2012-03-12 ENCOUNTER — Ambulatory Visit: Payer: Medicare Other | Admitting: Pulmonary Disease

## 2012-03-12 NOTE — Telephone Encounter (Signed)
Follow-up:    Patient returned your call.  Please call back. 

## 2012-03-12 NOTE — Telephone Encounter (Signed)
Pt rtn call to christine, pls call [phone]

## 2012-03-12 NOTE — Telephone Encounter (Signed)
PT AWARE OF ECHO RESULTS./CY 

## 2012-03-23 ENCOUNTER — Ambulatory Visit (INDEPENDENT_AMBULATORY_CARE_PROVIDER_SITE_OTHER): Payer: Medicare Other | Admitting: Pulmonary Disease

## 2012-03-23 ENCOUNTER — Encounter: Payer: Self-pay | Admitting: Pulmonary Disease

## 2012-03-23 ENCOUNTER — Encounter: Payer: Self-pay | Admitting: *Deleted

## 2012-03-23 VITALS — BP 160/80 | HR 75 | Temp 97.9°F | Ht 66.0 in | Wt 232.8 lb

## 2012-03-23 DIAGNOSIS — I10 Essential (primary) hypertension: Secondary | ICD-10-CM

## 2012-03-23 DIAGNOSIS — K5730 Diverticulosis of large intestine without perforation or abscess without bleeding: Secondary | ICD-10-CM

## 2012-03-23 DIAGNOSIS — M179 Osteoarthritis of knee, unspecified: Secondary | ICD-10-CM | POA: Insufficient documentation

## 2012-03-23 DIAGNOSIS — I872 Venous insufficiency (chronic) (peripheral): Secondary | ICD-10-CM

## 2012-03-23 DIAGNOSIS — M25562 Pain in left knee: Secondary | ICD-10-CM

## 2012-03-23 DIAGNOSIS — M545 Low back pain, unspecified: Secondary | ICD-10-CM

## 2012-03-23 DIAGNOSIS — M1990 Unspecified osteoarthritis, unspecified site: Secondary | ICD-10-CM

## 2012-03-23 DIAGNOSIS — E669 Obesity, unspecified: Secondary | ICD-10-CM

## 2012-03-23 DIAGNOSIS — G589 Mononeuropathy, unspecified: Secondary | ICD-10-CM

## 2012-03-23 DIAGNOSIS — M25569 Pain in unspecified knee: Secondary | ICD-10-CM

## 2012-03-23 DIAGNOSIS — M1710 Unilateral primary osteoarthritis, unspecified knee: Secondary | ICD-10-CM | POA: Insufficient documentation

## 2012-03-23 DIAGNOSIS — M25561 Pain in right knee: Secondary | ICD-10-CM

## 2012-03-23 DIAGNOSIS — D126 Benign neoplasm of colon, unspecified: Secondary | ICD-10-CM

## 2012-03-23 MED ORDER — SPIRONOLACTONE 25 MG PO TABS: 25.0000 mg | ORAL_TABLET | Freq: Every day | ORAL | Status: DC

## 2012-04-16 ENCOUNTER — Other Ambulatory Visit: Payer: Self-pay | Admitting: Pulmonary Disease

## 2012-05-10 ENCOUNTER — Encounter: Payer: Self-pay | Admitting: Pulmonary Disease

## 2012-05-10 NOTE — Progress Notes (Signed)
Subjective:    Patient ID: [name], female    DOB: October 02, 1945, 67 y.o.   MRN: [id number]  HPI 67 y/o BF here for a follow up visit... she has multiple medical problems as noted below, and a hx of poor compliance w/ med Rx and office follow-ups...   ~  March 20, 2011:  Yearly [place] says she is doing well overall but nothing really has changed> not dieting effectively w/ wt up to 240# "it was Thanksgiving"; BP elevated & she states "I've been eating soup in the can";  Exercise is difficult due to severe knee arthritis & she hasn't come to grips w/ the need for TKRs;  See prob list below...    HBP> on 47meds: Labet200-2Bid, Amlodip10, Losar100, Demedex20-2Qam w/ K20Bid; BP today= 180/92 & I suspect she has run out of several meds, didn't bring bottles & requesting refill Rxs today; she wants to change the Losartan back to Diovan320; CXR today w/ mild cardiomeg, Electrolyes are wnl & renal function is normal, BNP similarly is ok at 58... Discussed w/ pt> MUST take meds every day, get on diet & get wt down!!!    VI/ Edema> eating way too much sodium & taking the Demedex 40qd; rec 2gm Na+ diet, elevation, support hose, same meds...    Obesity> it is critical that she get her wt down; hindered by arthritis making exercise difficult; rec for water exerc, non wt bearing exerc etc...    Divertics/ Colon polyps/ Hems> on Blue as needed; last colon 10/08 & f/u due 10/13...    DJD/ severe bilat knee arthritis/ DDD> on Mobic7.5 + Tramadol50 prn; her only viable option is TKR surg but she is reluctant to consider, offered 2nd opinion at [facility]...    Vit D Defic> Vit D level remains low at 15; we will Rx w/ 50K weekly Rx...    Anemia> Hg= 11.9 sl improved...  ~  January 10, 2012:  57mo ROV & Brittney has 3 main problems today> BP, Edema, Abd pain>>    1) HBP> supposed to be on Labet200Bid, Amlod10, Losar100, Demadex20-2Qam & K20-?Bid; a call to her mult pharmacies indicates that she hasn't been filling  the diuretic or KCl at all; exam shows BP= 160/90 and she has chrVI in legs w/ 4+pitting in ankles; once again asked to take all meds every day, and we decied to stop the Amlod10 due to edema & substitute Minoxidil10mg /d;  Plan ROV 9mo w/ all med bottles to recheck...    2) VI, Edema> she has chronic lymphedema in LEs w/ 4+pitting in ankles today; ?it appears from call to her [employer] that she is not taking the Demadex? Or the KCl; We discussed Rx w/ NO SALT, elevation, support hose vs ACE wraps, & take her diuretic daily... ?any option for a Lymphedema clinic? Plan ROV 24mo w/ all med bottles to recheck...     3) Lower Abd Pain> c/o vague 72mo hx of lower abd discomfort across the suprapubic area, assoc w/ some constip; she denies other abd pain, n/v, swelling, gas, etc; she notes hard stools, some straining, denies blood; she has never had CT Abd & we decided to proceed w/ CT and refer to DrStark for colonoscopy due; Add Miralax & Senakot-S...    4) As noted she has numerous additional medical problems> SEE BELOW; and hx of poor compliance w/ medications and medical follow up... We reviewed prob list, meds, xrays and labs> see below for updates >> OK Flu vaccine  today... LABS 10/13:  FLP- all parameters at goals;  Chems- ok x K=3.3 & sl incr LFTs;  CBC- mild anemia w/ Hg=11.4;  TSH=1.03;  VitD=42;  BNP=58;  Sed=54; UA- clear. CT Abd&Pelvis> done 01/14/12> NAD... There was cardiomeg, & scarring at lung bases; left renal cyst, scat divertics w/o inflamm, s/o hyst, mild degen arthritis in spine, tortuous calcif Ao, NAD...  ADDENDUM 02/03/12>> pt has called several times since 01/10/12 OV where we stopped her Amlodipine10 due to edema & started Minoxidil10;  She saw DrStark 10/10 & office BP=152/80;  She called 10/14 c/o rapid heartbeat & "edema" from the Minoxidil, her Pharmacist told her to stop it;  Then her GYN said BP was 210/110 => 160/84;  We reviewed all her meds and rec the following>> restart Amlodipine at  5mg /d, increase Labetolol 200mg - to 2tabsBid, keep Losartan 100mg /d, keep Demadex20- 2Qam, keep K20Bid; reminded to take all meds regularly every day & bring all bottles to med visits; and we will refer to Cards for their suggestions (needs f/u 2DEcho).  ~  March 23, 2012:  73mo ROV & f/u BP on new regimen as outlined> Amlod5, Labet200-2Bid, Losar100, Demadex20-2Qam & K20Bid=> BP=160/80 and her wt is down 5# to 233# today w/ decr edema noted; we discussed options and decided to add ALDACTONE 25mg /d to her regimen w/ ROV in another 20mo...    She had Cards eval by Cherly Hensen 11/13> he noted HBP, lack of compliance w/ med rx, inability to exercise due to knee arthritis; he rec that we try Clonidine & Hydralazine if additional meds needed for her BP;  2DEcho 12/13 showed modLVH w/ incr wall thickness, norm LVF w/ EF=65-70%, Gr1DD, mild AI, mildMRmod LAdil...     She had GI eval by DrStark> CT Abd 10/13 revealed scarring at lung bases, cardiomegaly, left renal cyst, divertics, s/p hyst, degen changes in spine, tortuous calcif Ao;  Colonoscopy 11/13 showed 32mm polypin asc colon, mod divertics, mod ext hems; Bx= tubular adenoma & f/u planned 5 yrs...           Problem List:   HYPERTENSION (ICD-401.9) - on LABETOLOL 200mg Bid,  NORVASC 10mg /d,  LOSARTAN 100mg /d,  DEMEDEX 20mg - 2tabs/d,  KCl 11mEqBid... hx severe HBP in the past w/ eval from Nephrology- DrGarber in 2000 w/ neg MRA Abd- no RAS, +tort AbdAo.Marland KitchenMarland KitchenNOTE: renal function normal,  cholesterol normal... ?INTOL to Minoxidil in past. ~  2DEcho 1999 showed mild LVH, EF= 60%, mild MV thickening & MR, AoV sclerosis... ~  2DEcho 2006 showed mod concentric LVH, AoV sclerosis w/ mild AI, thickened MV w/ mild MR. ~  baseline CXR w/ cardiomegaly, chr bronchitic changes, prom R superior mediast soft tissue w/o ch. ~  baseline EKG w/ NSR, LAD, incr voltage, late transition... ~  2/11: she requested change from Diovan to generic> Losartan 100mg /d. ~  12/11:  BP=  146/70 & tol meds well- denies CP, palpit, change in SOB or edema; labs showed BUN=22, Creat=1.4, K=3.3.Marland KitchenMarland Kitchen rec to take K20 Bid regularly! ~  12/12:  BP= 180/92 & medication compliance is suspect (didn't bring bottles to inspect); she is requesting change from Losartan back to [id number], labs ok & she is asked to f/u in 3-4 months to recheck BP (she never did) & consider f/u 2DEcho... CXR 12/12 showed cardiomeg, clear lungs, NAD...  ~  10/13:  89mo ROV & BP= 162/92 supposedly on Labet200Bid, Amlod10, Losar100, Demadex20-2Qam & K20-?Bid; ?not taking Demadex or KCl?; refilled meds & we decided to change Amlod10 to Minoxidil10  due to edema ==> called back w/ c/o rapid heartbeat on Minoxidil therefore stopped & we restarted Amlodipine5 & increased Labetolol200-2Bid (refer to Cards for their input & needs f/u 2DEcho)... ~  11/13:  She had Cards eval by Cherly Hensen 11/13> he noted HBP, lack of compliance w/ med rx, inability to exercise due to knee arthritis; he rec that we try Clonidine & Hydralazine if additional meds needed for her BP;   ~  2DEcho 12/13 showed modLVH w/ incr wall thickness, norm LVF w/ EF=65-70%, Gr1DD, mild AI, mildMRmod LAdil...  ~  12/16: on Amlod5, Labet200-2Bid, Losar100, Demadex20-2Qam & K20Bid=> BP=160/80 and her wt is down 5# to 233# today w/ decr edema noted; we discussed options and decided to add ALDACTONE 25mg /d to her regimen w/ ROV in another 17mo.  VENOUS INSUFFICIENCY (ICD-459.81) & EDEMA - she is supposed to follow a low sodium diet, elevate legs, & wears support hose when able... continue Demadex 20mg - 2Qam & KCl... ~  12/12: she's been consuming way too much sodium & we reviewed a 2gm Na+ diet & wt reduction needed... ~  10/13:  ?if she's taking the Demadex, didn't brink bottles, lists 4 [employer] & most recent 2 haven't filled the diuretic...  OBESITY (ICD-278.00) - she has been counselled on a low carb, low fat, weight reducing diet... ~  weight 1/10 = 228# which is her peak  and up 10# in the last yr... ~  weight 2/11 = 233# ~  weight 8/11 = 233#...  reminded of diet + exercise needed. ~  weight 12/11 = 236# ~  Weight 12/12 = 240#... rec [facility] vs wt watchers etc... ~  Weight 10/13 = 239# ~  Weight 12/13 = 233#  DIVERTICULOSIS OF COLON (ICD-562.10),  COLONIC POLYPS (ICD-211.3),  HEMORRHOIDS (ICD-455.6) - last colonoscopy 10/08 by DrStark showed divertics, hems, & several 3-87mm polyps= adenomatous w/ f/u planned in 5 yrs. ~  2/11:  she notes some constipation & is rec to take Miralax & Senakot-S. ~  8/11:  [place] written for hems... ~  10/13: she is due for another colonoscopy & is c/o pain across lower abd; Hg=11.4 and Sed=54; needs CT Abd&Pelvis> done 01/14/12> NAD... There was cardiomeg, & scarring at lung bases; left renal cyst, scat divertics w/o inflamm, s/o hyst, mild degen arthritis in spine, tortuous calcif Ao, NAD... ~  She had GI eval by DrStark> CT Abd 10/13 revealed scarring at lung bases, cardiomegaly, left renal cyst, divertics, s/p hyst, degen changes in spine, tortuous calcif Ao;  Colonoscopy 11/13 showed 45mm polypin asc colon, mod divertics, mod ext hems; Bx= tubular adenoma & f/u planned 5 yrs...   RENAL CYST (ICD-593.2) - incidentally noted left lower pole simple renal cyst...  DEGENERATIVE JOINT DISEASE (ICD-715.90) - known bilat knee arthritis w/ eval by DrNitka in 2003... bone-on-bone w/ cortisone shots attempted in 2005, and Synvisc in 2006... she takes [facility] 7.5mg  Prn & TRAMADOL 50mg  Prn... ~  12/11: she has knee braces to help w/ ambulation; she is advised to f/u w/ Ortho & reconsider staged joint replacement operations... ~  12/12: we reviewed her severe OA situation w/ her; she will need bilat TKRs eventually & should proceed, but she is holding off, offered 2nd opinion at [facility]... ~  BMD 9/13 by drNeal showed TScores +0.1 in Spine and -0.8 in left FemNeck...  BACK PAIN, LUMBAR (ICD-724.2) - she has known  DDD... ~  Shara Blazing tried Lyrica w/ some improvement in symptoms.  VITAMIN D  DEFICIENCY (ICD-268.9) - Vit D level 9/08 = 5... started on Vit D 50,000 u weekly, but she stopped this on her own after several months and switched to Vit D 1000 u daily... ~  labs 1/10 showed Vit D level = 22... rec to incr OTC Vit D to 2000 u daily (never did). ~  labs 2/11 showed Vit D level = 21... rec to get on the 2000 u daily! ~  Labs 12/12 showed Vit d level = 15... rec to restart VitD 50000 u weekly & stay on this! ~  Labs 10/13 showed Vit D level = 42... rec to continue Vit D 50K weekly...  NEUROPATHY (ICD-355.9)  Hx of ANEMIA (ICD-285.9) - hx anemia from heavy menses before her hysterectomy in 1989... subseq mild anemia- multifactorial in nature... ~  labs 9/08 showed Hg= 11.8 ~  labs 1/10 showed Hg= 12.1 ~  labs 2/11 showed Hg= 11.9 ~  labs 12/11 showed Hg= 11.0, MCV= 87 ~  Labs 12/12 showed Hg= 11.9, MCV= 85 ~  Labs 10/13 showed Hg= 11.4, MCV= 86  Health Maintenance - she is supposed to take ASA 81mg /d,  Calcium Bid,  MVI daily, & Vit D 2000 u daily... she refuses Flu shots.   Past Surgical History  Procedure Date  . [place] and right oopherectomy 1998    Dr. Nori Riis for fibroids and cyst   SHE DID NOT BRING MED BOTTLES TO THE OFFICE AS REQUESTED>> Outpatient Encounter Prescriptions as of 03/23/2012  Medication Sig Dispense Refill  . amLODipine (NORVASC) 5 MG tablet Take 1 tablet (5 mg total) by mouth daily.  30 tablet  11  . aspirin 81 MG tablet Take 81 mg by mouth daily.        . calcium carbonate (OS-CAL - DOSED IN MG OF ELEMENTAL CALCIUM) 1250 MG tablet Take 2 tablets by mouth daily.      . clobetasol ointment (TEMOVATE) 0.05 % Use as directed      . hydrocortisone (ANUSOL-HC) 2.5 % rectal cream Place 1 application rectally as needed.        . labetalol (NORMODYNE) 200 MG tablet Take 2 tablets (400 mg total) by mouth 2 (two) times daily.  120 tablet  6  . losartan (COZAAR) 100 MG tablet TAKE 1  TABLET BY MOUTH EVERY DAY  30 tablet  11  . meloxicam (MOBIC) 7.5 MG tablet Take 1 tablet (7.5 mg total) by mouth daily. As needed for arthritis pain  30 tablet  11  . Multiple Vitamins-Minerals (MULTIVITAMIN PO) Take 1 tablet by mouth daily.        . potassium chloride SA (K-DUR,KLOR-CON) 20 MEQ tablet Take 20 mEq by mouth 2 (two) times daily.        Marland Kitchen torsemide (DEMADEX) 20 MG tablet Take 40 mg by mouth daily.        . traMADol (ULTRAM) 50 MG tablet Take 1 tablet (50 mg total) by mouth every 6 (six) hours as needed. Maximum dose= 8 tablets per day  90 tablet  6  . [DISCONTINUED] Vitamin D, Ergocalciferol, (DRISDOL) 50000 UNITS CAPS Take 1 capsule (50,000 Units total) by mouth every 7 (seven) days.  4 capsule  11  . spironolactone (ALDACTONE) 25 MG tablet Take 1 tablet (25 mg total) by mouth daily.  30 tablet  6    Allergies  Allergen Reactions  . Minoxidil     Causes palpitations/unable to sleep  . Naproxen     REACTION: swelling    Current  Medications, Allergies, Past Medical History, Past Surgical History, Family History, and Social History were reviewed in [employer] record.    Review of Systems         See HPI - all other systems neg except as noted...  The patient complains of dyspnea on exertion, muscle weakness, and difficulty walking.  The patient denies anorexia, fever, weight loss, weight gain, vision loss, decreased hearing, hoarseness, chest pain, syncope, peripheral edema, prolonged cough, headaches, hemoptysis, abdominal pain, melena, hematochezia, severe indigestion/heartburn, hematuria, incontinence, suspicious skin lesions, transient blindness, depression, unusual weight change, abnormal bleeding, enlarged lymph nodes, and angioedema.   Objective:   Physical Exam      WD, Overweight, 67 y/o BF in NAD... GENERAL:  Alert & oriented; pleasant & cooperative... HEENT:  [place]/AT, EOM-wnl, PERRLA, EACs-clear, TMs-wnl, NOSE-clear, THROAT-clear &  wnl. NECK:  Supple w/ fairROM; no JVD; normal carotid impulses w/o bruits; palp thyroid w/o nodules; no lymphadenopathy. CHEST:  Clear to P & A; without wheezes/ rales/ or rhonchi heard... HEART:  Regular Rhythm; without murmurs/ rubs/ or gallops detected... ABDOMEN:  Obese, soft & nontender; normal bowel sounds; no organomegaly or masses detected. EXT:  mod-severe arthritic changes; no varicose veins/ +venous insuffic/ 2+edema. NEURO:  CN's intact; motor testing normal; sensory testing normal; gait abn due to severe arthritis. DERM:  No lesions noted; no rash etc...  RADIOLOGY DATA:  Reviewed in the EPIC EMR & discussed w/ the patient...  LABORATORY DATA:  Reviewed in the EPIC EMR & discussed w/ the patient...   Assessment & Plan:    HBP>  Hx severe HBP & I believe she has become complacent and noncompliant w/ her 4 med regimen; once again she did not bring bottles to office & she lists 4 diff [employer]arms!  Amlod5, Labet200-2Bid, Losar100, Demadex20-2Qam & K20Bid=> BP=160/80 and her wt is down 5# to 233# today w/ decr edema noted; we discussed options and decided to add ALDACTONE 25mg /d to her regimen w/ ROV in another 60mo.  VI/ Edema>  She MUST comply w/ low sodium restriction & canned soups won't cut it; she was given another 2gm Na+ diet sheet; reminded to elevate legs & wear support hose, and take diuretic daily...   Obesity>  She's been totally ineffective on diet/ exercise; needs diet counseling wt watchers or something similar for help & she will consider her options and make her choice...  GI> Divertics, Polyps>  Stable, using [place] as needed; CT Abd/Pelvis was neg w/o acute changes & we will refer to drStark for further GI eval=> see above.  DJD, LBP>  This is one of her most signif issues- severe DJD knees do not allow for suffic exercise/ activity & wt reduction is difficult; advised to reconsider TKRs because without it she will be wheelchair bound & immobile; offered 2nd opinion  at [place] she will decide...  Vit D defic>  Severe Vit D defic, age 53, immobile, etc... Improved on Vit D 50K weekly, along w/ Calcium, MVI, and needs BMD measurement here...  Neuropathy>  Aware, likely related to her DDD but she states not that much back discomfort at this point...  Anemia>  Hg is approx stable at 11.4 at present...   Patient's Medications  New Prescriptions   SPIRONOLACTONE (ALDACTONE) 25 MG TABLET    Take 1 tablet (25 mg total) by mouth daily.  Previous Medications   AMLODIPINE (NORVASC) 5 MG TABLET    Take 1 tablet (5 mg total) by mouth daily.  ASPIRIN 81 MG TABLET    Take 81 mg by mouth daily.     CALCIUM CARBONATE (OS-CAL - DOSED IN MG OF ELEMENTAL CALCIUM) 1250 MG TABLET    Take 2 tablets by mouth daily.   CLOBETASOL OINTMENT (TEMOVATE) 0.05 %    Use as directed   HYDROCORTISONE (ANUSOL-HC) 2.5 % RECTAL CREAM    Place 1 application rectally as needed.     LABETALOL (NORMODYNE) 200 MG TABLET    Take 2 tablets (400 mg total) by mouth 2 (two) times daily.   LOSARTAN (COZAAR) 100 MG TABLET    TAKE 1 TABLET BY MOUTH EVERY DAY   MELOXICAM (MOBIC) 7.5 MG TABLET    Take 1 tablet (7.5 mg total) by mouth daily. As needed for arthritis pain   MULTIPLE VITAMINS-MINERALS (MULTIVITAMIN PO)    Take 1 tablet by mouth daily.     POTASSIUM CHLORIDE SA (K-DUR,KLOR-CON) 20 MEQ TABLET    Take 20 mEq by mouth 2 (two) times daily.     TORSEMIDE (DEMADEX) 20 MG TABLET    Take 40 mg by mouth daily.     TRAMADOL (ULTRAM) 50 MG TABLET    Take 1 tablet (50 mg total) by mouth every 6 (six) hours as needed. Maximum dose= 8 tablets per day  Modified Medications   Modified Medication Previous Medication   VITAMIN D, ERGOCALCIFEROL, (DRISDOL) 50000 UNITS CAPS Vitamin D, Ergocalciferol, (DRISDOL) 50000 UNITS CAPS      TAKE 1 CAPSULE BY MOUTH EVERY 7 DAYS    Take 1 capsule (50,000 Units total) by mouth every 7 (seven) days.  Discontinued Medications   No medications on file

## 2012-05-26 ENCOUNTER — Ambulatory Visit: Payer: Medicare Other | Admitting: Pulmonary Disease

## 2012-06-29 ENCOUNTER — Encounter: Payer: Self-pay | Admitting: Pulmonary Disease

## 2012-06-29 ENCOUNTER — Ambulatory Visit (INDEPENDENT_AMBULATORY_CARE_PROVIDER_SITE_OTHER): Payer: Medicare Other | Admitting: Pulmonary Disease

## 2012-06-29 VITALS — BP 136/88 | HR 66 | Temp 98.2°F | Resp 20 | Ht 66.0 in | Wt 238.0 lb

## 2012-06-29 DIAGNOSIS — I10 Essential (primary) hypertension: Secondary | ICD-10-CM

## 2012-06-29 DIAGNOSIS — D126 Benign neoplasm of colon, unspecified: Secondary | ICD-10-CM

## 2012-06-29 DIAGNOSIS — E559 Vitamin D deficiency, unspecified: Secondary | ICD-10-CM

## 2012-06-29 DIAGNOSIS — E669 Obesity, unspecified: Secondary | ICD-10-CM

## 2012-06-29 DIAGNOSIS — M1990 Unspecified osteoarthritis, unspecified site: Secondary | ICD-10-CM

## 2012-06-29 DIAGNOSIS — D649 Anemia, unspecified: Secondary | ICD-10-CM

## 2012-06-29 DIAGNOSIS — R609 Edema, unspecified: Secondary | ICD-10-CM

## 2012-06-29 DIAGNOSIS — I872 Venous insufficiency (chronic) (peripheral): Secondary | ICD-10-CM

## 2012-06-29 DIAGNOSIS — M545 Low back pain: Secondary | ICD-10-CM

## 2012-06-29 DIAGNOSIS — G589 Mononeuropathy, unspecified: Secondary | ICD-10-CM

## 2012-06-29 DIAGNOSIS — K5730 Diverticulosis of large intestine without perforation or abscess without bleeding: Secondary | ICD-10-CM

## 2012-06-29 MED ORDER — METOLAZONE 2.5 MG PO TABS: ORAL_TABLET | ORAL | Status: DC

## 2012-06-29 NOTE — Progress Notes (Signed)
Subjective:    Patient ID: [name], female    DOB: 08-15-1945, 67 y.o.   MRN: [id number]  HPI 67 y/o BF here for a follow up visit... she has multiple medical problems as noted below, and a hx of poor compliance w/ med Rx and office follow-ups...   ~  January 10, 2012:  54mo ROV & [name] has 3 main problems today> BP, Edema, Abd pain>>    1) HBP> supposed to be on Labet200Bid, Amlod10, Losar100, Demadex20-2Qam & K20-?Bid; a call to her mult pharmacies indicates that she hasn't been filling the diuretic or KCl at all; exam shows BP= 160/90 and she has chrVI in legs w/ 4+pitting in ankles; once again asked to take all meds every day, and we decied to stop the Amlod10 due to edema & substitute Minoxidil10mg /d;  Plan ROV 74mo w/ all med bottles to recheck...    2) VI, Edema> she has chronic lymphedema in LEs w/ 4+pitting in ankles today; ?it appears from call to her [employer] that she is not taking the Demadex? Or the KCl; We discussed Rx w/ NO SALT, elevation, support hose vs ACE wraps, & take her diuretic daily... ?any option for a Lymphedema clinic? Plan ROV 46mo w/ all med bottles to recheck...     3) Lower Abd Pain> c/o vague 36mo hx of lower abd discomfort across the suprapubic area, assoc w/ some constip; she denies other abd pain, n/v, swelling, gas, etc; she notes hard stools, some straining, denies blood; she has never had CT Abd & we decided to proceed w/ CT and refer to [name] for colonoscopy due; Add Miralax & Senakot-S...    4) As noted she has numerous additional medical problems> SEE BELOW; and hx of poor compliance w/ medications and medical follow up... We reviewed prob list, meds, xrays and labs> see below for updates >> OK Flu vaccine today... LABS 10/13:  FLP- all parameters at goals;  Chems- ok x K=3.3 & sl incr LFTs;  CBC- mild anemia w/ Hg=11.4;  TSH=1.03;  VitD=42;  BNP=58;  Sed=54; UA- clear. CT Abd&Pelvis> done 01/14/12> NAD... There was cardiomeg, & scarring at lung bases; left  renal cyst, scat divertics w/o inflamm, s/o hyst, mild degen arthritis in spine, tortuous calcif Ao, NAD...  ADDENDUM 02/03/12>> pt has called several times since 01/10/12 OV where we stopped her Amlodipine10 due to edema & started Minoxidil10;  She saw [name] 10/10 & office BP=152/80;  She called 10/14 c/o rapid heartbeat & "edema" from the Minoxidil, her Pharmacist told her to stop it;  Then her GYN said BP was 210/110 => 160/84;  We reviewed all her meds and rec the following>> restart Amlodipine at 5mg /d, increase Labetolol 200mg - to 2tabsBid, keep Losartan 100mg /d, keep Demadex20- 2Qam, keep K20Bid; reminded to take all meds regularly every day & bring all bottles to med visits; and we will refer to Cards for their suggestions (needs f/u 2DEcho).  ~  March 23, 2012:  44mo ROV & f/u BP on new regimen as outlined> Amlod5, Labet200-2Bid, Losar100, Demadex20-2Qam & K20Bid=> BP=160/80 and her wt is down 5# to 233# today w/ decr edema noted; we discussed options and decided to add ALDACTONE 25mg /d to her regimen w/ ROV in another 57mo...    She had Cards eval by [name] 11/13> he noted HBP, lack of compliance w/ med rx, inability to exercise due to knee arthritis; he rec that we try Clonidine & Hydralazine if additional meds needed for her BP;  2DEcho 12/13  showed modLVH w/ incr wall thickness, norm LVF w/ EF=65-70%, Gr1DD, mild AI, mildMRmod LAdil...     She had GI eval by [name]> CT Abd 10/13 revealed scarring at lung bases, cardiomegaly, left renal cyst, divertics, s/p hyst, degen changes in spine, tortuous calcif Ao;  Colonoscopy 11/13 showed 44mm polypin asc colon, mod divertics, mod ext hems; Bx= tubular adenoma & f/u planned 5 yrs...   ~  June 29, 2012:  66mo ROV & follow up BP & edema> last visit we added Aldactone 25mg /d to her Demadex 20mg -2Qam and K20bid... Unfortunately over the last 21mo she has gained 5# to 238# and continues to display 3+edema in LEs... States she is on a low salt diet,  elevating legs, & wearing support hose; she has trouble ambulating or exercising due to pain in her knees "they are so stiff" she says; Mobic seems to help some...  We discussed options & decided to add Zaroxyln 2.5mg  MWF, continue other meds and salt restriction, etc... She will ret in 1 month w/ labs...           Problem List:   HYPERTENSION (ICD-401.9) - on LABETOLOL 200mg Bid,  NORVASC 10mg /d,  LOSARTAN 100mg /d,  DEMEDEX 20mg - 2tabs/d,  KCl 44mEqBid... hx severe HBP in the past w/ eval from Nephrology- [name] in 2000 w/ neg MRA Abd- no RAS, +tort AbdAo.Marland KitchenMarland KitchenNOTE: renal function normal,  cholesterol normal... ?INTOL to Minoxidil in past. ~  2DEcho 1999 showed mild LVH, EF= 60%, mild MV thickening & MR, AoV sclerosis... ~  2DEcho 2006 showed mod concentric LVH, AoV sclerosis w/ mild AI, thickened MV w/ mild MR. ~  baseline CXR w/ cardiomegaly, chr bronchitic changes, prom R superior mediast soft tissue w/o ch. ~  baseline EKG w/ NSR, LAD, incr voltage, late transition... ~  2/11: she requested change from Diovan to generic> Losartan 100mg /d. ~  12/11:  BP= 146/70 & tol meds well- denies CP, palpit, change in SOB or edema; labs showed BUN=22, Creat=1.4, K=3.3.Marland KitchenMarland Kitchen rec to take K20 Bid regularly! ~  12/12:  BP= 180/92 & medication compliance is suspect (didn't bring bottles to inspect); she is requesting change from Losartan back to [id number], labs ok & she is asked to f/u in 3-4 months to recheck BP (she never did) & consider f/u 2DEcho... CXR 12/12 showed cardiomeg, clear lungs, NAD...  ~  10/13:  55mo ROV & BP= 162/92 supposedly on Labet200Bid, Amlod10, Losar100, Demadex20-2Qam & K20-?Bid; ?not taking Demadex or KCl?; refilled meds & we decided to change Amlod10 to Minoxidil10 due to edema ==> called back w/ c/o rapid heartbeat on Minoxidil therefore stopped & we restarted Amlodipine5 & increased Labetolol200-2Bid (refer to Cards for their input & needs f/u 2DEcho)... ~  11/13:  She had Cards eval by  [name] 11/13> he noted HBP, lack of compliance w/ med rx, inability to exercise due to knee arthritis; he rec that we try Clonidine & Hydralazine if additional meds needed for her BP;   ~  2DEcho 12/13 showed modLVH w/ incr wall thickness, norm LVF w/ EF=65-70%, Gr1DD, mild AI, mildMRmod LAdil...  ~  12/13: on Amlod5, Labet200-2Bid, Losar100, Demadex20-2Qam & K20Bid=> BP=160/80 and her wt is down 5# to 233# today w/ decr edema noted; we discussed options and decided to add ALDACTONE 25mg /d to her regimen w/ ROV in another 40mo. ~  3/14: on Amlod5, Labet200-2Bid, Losar100, Demadex20-2Qam, Aldactone25/d, K20Bid; BP= 136/88; wt up 5# to 238#; exam w/ 3+pitting in LEs, Chest-clear, Heart-regular; we decided to add ZAROXYLN2.5mg MWF and f/u  18mo w/ labs...  VENOUS INSUFFICIENCY (ICD-459.81) & EDEMA - she is supposed to follow a low sodium diet, elevate legs, & wears support hose when able... continue Demadex 20mg - 2Qam & KCl... ~  12/12: she's been consuming way too much sodium & we reviewed a 2gm Na+ diet & wt reduction needed... ~  10/13:  ?if she's taking the Demadex, didn't brink bottles, lists [facility] & most recent 2 haven't filled the diuretic... ~  3/14:  She says she is taking her meds faithfully- still w/ 3+pitting edema in LEs...  OBESITY (ICD-278.00) - she has been counselled on a low carb, low fat, weight reducing diet... ~  weight 1/10 = 228# which is her peak and up 10# in the last yr... ~  weight 2/11 = 233# ~  weight 8/11 = 233#...  reminded of diet + exercise needed. ~  weight 12/11 = 236# ~  Weight 12/12 = 240#... rec [facility] vs wt watchers etc... ~  Weight 10/13 = 239# ~  Weight 12/13 = 233# ~  Weight 3/14 = 238#  DIVERTICULOSIS OF COLON (ICD-562.10),  COLONIC POLYPS (ICD-211.3),  HEMORRHOIDS (ICD-455.6) - last colonoscopy 10/08 by [name] showed divertics, hems, & several 3-54mm polyps= adenomatous w/ f/u planned in 5 yrs. ~  2/11:  she notes some  constipation & is rec to take Miralax & Senakot-S. ~  8/11:  [place] written for hems... ~  10/13: she is due for another colonoscopy & is c/o pain across lower abd; Hg=11.4 and Sed=54; needs CT Abd&Pelvis> done 01/14/12> NAD... There was cardiomeg, & scarring at lung bases; left renal cyst, scat divertics w/o inflamm, s/o hyst, mild degen arthritis in spine, tortuous calcif Ao, NAD... ~  She had GI eval by [name]> CT Abd 10/13 revealed scarring at lung bases, cardiomegaly, left renal cyst, divertics, s/p hyst, degen changes in spine, tortuous calcif Ao;  Colonoscopy 11/13 showed 57mm polypin asc colon, mod divertics, mod ext hems; Bx= tubular adenoma & f/u planned 5 yrs...   RENAL CYST (ICD-593.2) - incidentally noted left lower pole simple renal cyst...  DEGENERATIVE JOINT DISEASE (ICD-715.90) - known bilat knee arthritis w/ eval by DrNitka in 2003... bone-on-bone w/ cortisone shots attempted in 2005, and Synvisc in 2006... she takes MOBIC 7.5mg  Prn & TRAMADOL 50mg  Prn... ~  12/11: she has knee braces to help w/ ambulation; she is advised to f/u w/ Ortho & reconsider staged joint replacement operations... ~  12/12: we reviewed her severe OA situation w/ her; she will need bilat TKRs eventually & should proceed, but she is holding off, offered 2nd opinion at [facility]... ~  BMD 9/13 by drNeal showed TScores +0.1 in Spine and -0.8 in left FemNeck...  BACK PAIN, LUMBAR (ICD-724.2) - she has known DDD... ~  Shara Blazing tried Lyrica w/ some improvement in symptoms.  VITAMIN D DEFICIENCY (ICD-268.9) - Vit D level 9/08 = 5... started on Vit D 50,000 u weekly, but she stopped this on her own after several months and switched to Vit D 1000 u daily... ~  labs 1/10 showed Vit D level = 22... rec to incr OTC Vit D to 2000 u daily (never did). ~  labs 2/11 showed Vit D level = 21... rec to get on the 2000 u daily! ~  Labs 12/12 showed Vit d level = 15... rec to restart VitD 50000 u weekly & stay on this! ~  Labs  10/13 showed Vit D level = 42... rec to continue Vit  D 50K weekly...  NEUROPATHY (ICD-355.9)  Hx of ANEMIA (ICD-285.9) - hx anemia from heavy menses before her hysterectomy in 1989... subseq mild anemia- multifactorial in nature... ~  labs 9/08 showed Hg= 11.8 ~  labs 1/10 showed Hg= 12.1 ~  labs 2/11 showed Hg= 11.9 ~  labs 12/11 showed Hg= 11.0, MCV= 87 ~  Labs 12/12 showed Hg= 11.9, MCV= 85 ~  Labs 10/13 showed Hg= 11.4, MCV= 86  Health Maintenance - she is supposed to take ASA 81mg /d,  Calcium Bid,  MVI daily, & Vit D 2000 u daily... she refuses Flu shots.   Past Surgical History  Procedure Laterality Date  . [place] and right oopherectomy  1998    Dr. Nori Riis for fibroids and cyst   SHE DID NOT BRING MED BOTTLES TO THE OFFICE AS REQUESTED>> Outpatient Encounter Prescriptions as of 06/29/2012  Medication Sig Dispense Refill  . amLODipine (NORVASC) 5 MG tablet Take 1 tablet (5 mg total) by mouth daily.  30 tablet  11  . aspirin 81 MG tablet Take 81 mg by mouth daily.        . calcium carbonate (OS-CAL - DOSED IN MG OF ELEMENTAL CALCIUM) 1250 MG tablet Take 2 tablets by mouth daily.      . clobetasol ointment (TEMOVATE) 0.05 % Use as directed      . hydrocortisone (ANUSOL-HC) 2.5 % rectal cream Place 1 application rectally as needed.        . labetalol (NORMODYNE) 200 MG tablet Take 2 tablets (400 mg total) by mouth 2 (two) times daily.  120 tablet  6  . losartan (COZAAR) 100 MG tablet TAKE 1 TABLET BY MOUTH EVERY DAY  30 tablet  11  . meloxicam (MOBIC) 7.5 MG tablet Take 1 tablet (7.5 mg total) by mouth daily. As needed for arthritis pain  30 tablet  11  . Multiple Vitamins-Minerals (MULTIVITAMIN PO) Take 1 tablet by mouth daily.        . potassium chloride SA (K-DUR,KLOR-CON) 20 MEQ tablet Take 20 mEq by mouth 2 (two) times daily.        Marland Kitchen spironolactone (ALDACTONE) 25 MG tablet Take 1 tablet (25 mg total) by mouth daily.  30 tablet  6  . torsemide (DEMADEX) 20 MG tablet Take 40 mg by  mouth daily.        . traMADol (ULTRAM) 50 MG tablet Take 1 tablet (50 mg total) by mouth every 6 (six) hours as needed. Maximum dose= 8 tablets per day  90 tablet  6  . Vitamin D, Ergocalciferol, (DRISDOL) 50000 UNITS CAPS TAKE 1 CAPSULE BY MOUTH EVERY 7 DAYS  4 capsule  6  . metolazone (ZAROXOLYN) 2.5 MG tablet Take 1 tablet by mouth on Monday, wedensday and friday  30 tablet  1   No facility-administered encounter medications on file as of 06/29/2012.    Allergies  Allergen Reactions  . Minoxidil     Causes palpitations/unable to sleep  . Naproxen     REACTION: swelling    Current Medications, Allergies, Past Medical History, Past Surgical History, Family History, and Social History were reviewed in [employer] record.    Review of Systems         See HPI - all other systems neg except as noted...  The patient complains of dyspnea on exertion, muscle weakness, and difficulty walking.  The patient denies anorexia, fever, weight loss, weight gain, vision loss, decreased hearing, hoarseness, chest pain, syncope, peripheral edema, prolonged  cough, headaches, hemoptysis, abdominal pain, melena, hematochezia, severe indigestion/heartburn, hematuria, incontinence, suspicious skin lesions, transient blindness, depression, unusual weight change, abnormal bleeding, enlarged lymph nodes, and angioedema.   Objective:   Physical Exam      WD, Overweight, 67 y/o BF in NAD... GENERAL:  Alert & oriented; pleasant & cooperative... HEENT:  /AT, EOM-wnl, PERRLA, EACs-clear, TMs-wnl, NOSE-clear, THROAT-clear & wnl. NECK:  Supple w/ fairROM; no JVD; normal carotid impulses w/o bruits; palp thyroid w/o nodules; no lymphadenopathy. CHEST:  Clear to P & A; without wheezes/ rales/ or rhonchi heard... HEART:  Regular Rhythm; without murmurs/ rubs/ or gallops detected... ABDOMEN:  Obese, soft & nontender; normal bowel sounds; no organomegaly or masses detected. EXT:  mod-severe  arthritic changes; no varicose veins/ +venous insuffic/ 2+edema. NEURO:  CN's intact; motor testing normal; sensory testing normal; gait abn due to severe arthritis. DERM:  No lesions noted; no rash etc...  RADIOLOGY DATA:  Reviewed in the EPIC EMR & discussed w/ the patient...  LABORATORY DATA:  Reviewed in the EPIC EMR & discussed w/ the patient...   Assessment & Plan:    HBP>  Hx severe HBP & I believe she was noncompliant w/ her 4 med regimen; once again she did not bring bottles to office & she lists 4 diff [facility]!  Amlod5, Labet200-2Bid, Losar100, Demadex20-2Qam & K20Bid=> BP=160/80 and her wt is down 5# to 233# 12/13 w/ decr edema noted; we discussed options and decided to add ALDACTONE 25mg /d to her regimen w/ ROV in another 42mo... ROV 3/14 w/ 5# wt gain & 3+pitting edema in LEs> she states compliant w/ meds but didn't brink bottles; we decided to add Zaroxyln 2.5mg  MWF & f/u 1 mo w/ labs...  VI/ Edema>  She MUST comply w/ low sodium restriction & she was given another 2gm Na+ diet sheet; reminded to elevate legs & wear support hose, and take all diuretics daily...   Obesity>  She's been totally ineffective on diet/ exercise; needs diet counseling wt watchers or something similar for help & she will consider her options and make her choice...  GI> Divertics, Polyps>  Stable, using [place] as needed; CT Abd/Pelvis was neg w/o acute changes & we will refer to [name] for further GI eval=> see above.  DJD, LBP>  This is one of her most signif issues- severe DJD knees do not allow for suffic exercise/ activity & wt reduction is difficult; advised to reconsider TKRs because without it she will be wheelchair bound & immobile; offered 2nd opinion at [place] she will decide...  Vit D defic>  Severe Vit D defic, age 89, immobile, etc... Improved on Vit D 50K weekly, along w/ Calcium, MVI, and needs BMD measurement here...  Neuropathy>  Aware, likely related to her DDD but she states not that  much back discomfort at this point...  Anemia>  Hg is approx stable at 11.4 at present...   Patient's Medications  New Prescriptions   METOLAZONE (ZAROXOLYN) 2.5 MG TABLET    Take 1 tablet by mouth on Monday, wedensday and friday  Previous Medications   AMLODIPINE (NORVASC) 5 MG TABLET    Take 1 tablet (5 mg total) by mouth daily.   ASPIRIN 81 MG TABLET    Take 81 mg by mouth daily.     CALCIUM CARBONATE (OS-CAL - DOSED IN MG OF ELEMENTAL CALCIUM) 1250 MG TABLET    Take 2 tablets by mouth daily.   CLOBETASOL OINTMENT (TEMOVATE) 0.05 %    Use as directed  HYDROCORTISONE (ANUSOL-HC) 2.5 % RECTAL CREAM    Place 1 application rectally as needed.     LABETALOL (NORMODYNE) 200 MG TABLET    Take 2 tablets (400 mg total) by mouth 2 (two) times daily.   LOSARTAN (COZAAR) 100 MG TABLET    TAKE 1 TABLET BY MOUTH EVERY DAY   MELOXICAM (MOBIC) 7.5 MG TABLET    Take 1 tablet (7.5 mg total) by mouth daily. As needed for arthritis pain   MULTIPLE VITAMINS-MINERALS (MULTIVITAMIN PO)    Take 1 tablet by mouth daily.     POTASSIUM CHLORIDE SA (K-DUR,KLOR-CON) 20 MEQ TABLET    Take 20 mEq by mouth 2 (two) times daily.     SPIRONOLACTONE (ALDACTONE) 25 MG TABLET    Take 1 tablet (25 mg total) by mouth daily.   TORSEMIDE (DEMADEX) 20 MG TABLET    Take 40 mg by mouth daily.     TRAMADOL (ULTRAM) 50 MG TABLET    Take 1 tablet (50 mg total) by mouth every 6 (six) hours as needed. Maximum dose= 8 tablets per day   VITAMIN D, ERGOCALCIFEROL, (DRISDOL) 50000 UNITS CAPS    TAKE 1 CAPSULE BY MOUTH EVERY 7 DAYS  Modified Medications   No medications on file  Discontinued Medications   No medications on file

## 2012-06-29 NOTE — Patient Instructions (Addendum)
Today we updated your med list in our EPIC system...    Continue your current medications the same...  We decided to add another diuretic= ZAROXYLN (Metozolone) 2.5mg  take one tab on MWF only...  Call for any questions...  Let's plan a one month ROV & recheck your blood work at that time.Marland KitchenMarland Kitchen

## 2012-07-29 ENCOUNTER — Other Ambulatory Visit (INDEPENDENT_AMBULATORY_CARE_PROVIDER_SITE_OTHER): Payer: Medicare Other

## 2012-07-29 ENCOUNTER — Ambulatory Visit (INDEPENDENT_AMBULATORY_CARE_PROVIDER_SITE_OTHER): Payer: Medicare Other | Admitting: Pulmonary Disease

## 2012-07-29 ENCOUNTER — Encounter: Payer: Self-pay | Admitting: Pulmonary Disease

## 2012-07-29 VITALS — BP 128/70 | HR 66 | Temp 98.1°F | Ht 66.0 in | Wt 231.0 lb

## 2012-07-29 DIAGNOSIS — G589 Mononeuropathy, unspecified: Secondary | ICD-10-CM

## 2012-07-29 DIAGNOSIS — M25562 Pain in left knee: Secondary | ICD-10-CM

## 2012-07-29 DIAGNOSIS — E669 Obesity, unspecified: Secondary | ICD-10-CM

## 2012-07-29 DIAGNOSIS — M545 Low back pain, unspecified: Secondary | ICD-10-CM

## 2012-07-29 DIAGNOSIS — R609 Edema, unspecified: Secondary | ICD-10-CM

## 2012-07-29 DIAGNOSIS — M1990 Unspecified osteoarthritis, unspecified site: Secondary | ICD-10-CM

## 2012-07-29 DIAGNOSIS — I10 Essential (primary) hypertension: Secondary | ICD-10-CM

## 2012-07-29 DIAGNOSIS — M25569 Pain in unspecified knee: Secondary | ICD-10-CM

## 2012-07-29 DIAGNOSIS — I872 Venous insufficiency (chronic) (peripheral): Secondary | ICD-10-CM

## 2012-07-29 LAB — BASIC METABOLIC PANEL
Chloride: 100 mEq/L (ref 96–112)
GFR: 33.32 mL/min — ABNORMAL LOW (ref >60.00)
Glucose, Bld: 77 mg/dL (ref 70–99)
Potassium: 3.7 mEq/L (ref 3.5–5.1)
Sodium: 139 mEq/L (ref 135–145)

## 2012-07-29 NOTE — Patient Instructions (Addendum)
Today we updated your med list in our EPIC system...    Continue your current medications the same...  Today we rechecked your Metabolic panel...    We will call you w/ the results and make any needed changes in your meds at that time...  Call for any questions...  Let's plan a follow up visit in 2mo, sooner if needed for problems.Marland KitchenMarland Kitchen

## 2012-07-29 NOTE — Progress Notes (Signed)
Subjective:    Patient ID: [name], female    DOB: 10-Oct-1945, 67 y.o.   MRN: [id number]  HPI 67 y/o BF here for a follow up visit... she has multiple medical problems as noted below, and a hx of poor compliance w/ med Rx and office follow-ups...   ~  January 10, 2012:  87mo ROV & Meilin has 3 main problems today> BP, Edema, Abd pain>>    1) HBP> supposed to be on Labet200Bid, Amlod10, Losar100, Demadex20-2Qam & K20-?Bid; a call to her mult pharmacies indicates that she hasn't been filling the diuretic or KCl at all; exam shows BP= 160/90 and she has chrVI in legs w/ 4+pitting in ankles; once again asked to take all meds every day, and we decied to stop the Amlod10 due to edema & substitute Minoxidil10mg /d;  Plan ROV 75mo w/ all med bottles to recheck...    2) VI, Edema> she has chronic lymphedema in LEs w/ 4+pitting in ankles today; ?it appears from call to her [employer] that she is not taking the Demadex? Or the KCl; We discussed Rx w/ NO SALT, elevation, support hose vs ACE wraps, & take her diuretic daily... ?any option for a Lymphedema clinic? Plan ROV 56mo w/ all med bottles to recheck...     3) Lower Abd Pain> c/o vague 43mo hx of lower abd discomfort across the suprapubic area, assoc w/ some constip; she denies other abd pain, n/v, swelling, gas, etc; she notes hard stools, some straining, denies blood; she has never had CT Abd & we decided to proceed w/ CT and refer to DrStark for colonoscopy due; Add Miralax & Senakot-S...    4) As noted she has numerous additional medical problems> SEE BELOW; and hx of poor compliance w/ medications and medical follow up... We reviewed prob list, meds, xrays and labs> see below for updates >> OK Flu vaccine today... LABS 10/13:  FLP- all parameters at goals;  Chems- ok x K=3.3 & sl incr LFTs;  CBC- mild anemia w/ Hg=11.4;  TSH=1.03;  VitD=42;  BNP=58;  Sed=54; UA- clear. CT Abd&Pelvis> done 01/14/12> NAD... There was cardiomeg, & scarring at lung bases; left  renal cyst, scat divertics w/o inflamm, s/o hyst, mild degen arthritis in spine, tortuous calcif Ao, NAD...  ADDENDUM 02/03/12>> pt has called several times since 01/10/12 OV where we stopped her Amlodipine10 due to edema & started Minoxidil10;  She saw DrStark 10/10 & office BP=152/80;  She called 10/14 c/o rapid heartbeat & "edema" from the Minoxidil, her Pharmacist told her to stop it;  Then her GYN said BP was 210/110 => 160/84;  We reviewed all her meds and rec the following>> restart Amlodipine at 5mg /d, increase Labetolol 200mg - to 2tabsBid, keep Losartan 100mg /d, keep Demadex20- 2Qam, keep K20Bid; reminded to take all meds regularly every day & bring all bottles to med visits; and we will refer to Cards for their suggestions (needs f/u 2DEcho).  ~  March 23, 2012:  25mo ROV & f/u BP on new regimen as outlined> Amlod5, Labet200-2Bid, Losar100, Demadex20-2Qam & K20Bid=> BP=160/80 and her wt is down 5# to 233# today w/ decr edema noted; we discussed options and decided to add ALDACTONE 25mg /d to her regimen w/ ROV in another 42mo...    She had Cards eval by Cherly Hensen 11/13> he noted HBP, lack of compliance w/ med rx, inability to exercise due to knee arthritis; he rec that we try Clonidine & Hydralazine if additional meds needed for her BP;  2DEcho 12/13  showed modLVH w/ incr wall thickness, norm LVF w/ EF=65-70%, Gr1DD, mild AI, mildMRmod LAdil...     She had GI eval by DrStark> CT Abd 10/13 revealed scarring at lung bases, cardiomegaly, left renal cyst, divertics, s/p hyst, degen changes in spine, tortuous calcif Ao;  Colonoscopy 11/13 showed 65mm polypin asc colon, mod divertics, mod ext hems; Bx= tubular adenoma & f/u planned 5 yrs...   ~  June 29, 2012:  23mo ROV & follow up BP & edema> last visit we added Aldactone 25mg /d to her Demadex 20mg -2Qam and K20bid... Unfortunately over the last 40mo she has gained 5# to 238# and continues to display 3+edema in LEs... States she is on a low salt diet,  elevating legs, & wearing support hose; she has trouble ambulating or exercising due to pain in her knees "they are so stiff" she says; Mobic seems to help some...  We discussed options & decided to add Zaroxyln 2.5mg  MWF, continue other meds and salt restriction, etc... She will ret in 1 month w/ labs...   ~  July 29, 2012:  80mo ROV on [place].5-MWF, plus her Demadex20-2Qam, Aldactone25Qpm, & K20Bid>> She has lost 7# of fluid down to 231# today and she can tell her legs are better & she further notes that her legs go down a lot at night, but edema builds up during the day; we reviewed NO SALT, elevation & support hose vs ACE wrap;  BMET shows- K=3.7, BUN=38, Cr=1.9 (sl worse); Therefore we decided to [facility] the Zaroxyln 2.5mg  just 2d per wk= MTh...          Problem List:   HYPERTENSION (ICD-401.9) - on LABETOLOL 200mg Bid,  NORVASC 10mg /d,  LOSARTAN 100mg /d,  DEMEDEX 20mg - 2tabs/d,  KCl 2mEqBid... hx severe HBP in the past w/ eval from Nephrology- DrGarber in 2000 w/ neg MRA Abd- no RAS, +tort AbdAo.Marland KitchenMarland KitchenNOTE: renal function normal,  cholesterol normal... ?INTOL to Minoxidil in past. ~  2DEcho 1999 showed mild LVH, EF= 60%, mild MV thickening & MR, AoV sclerosis... ~  2DEcho 2006 showed mod concentric LVH, AoV sclerosis w/ mild AI, thickened MV w/ mild MR. ~  baseline CXR w/ cardiomegaly, chr bronchitic changes, prom R superior mediast soft tissue w/o ch. ~  baseline EKG w/ NSR, LAD, incr voltage, late transition... ~  2/11: she requested change from Diovan to generic> Losartan 100mg /d. ~  12/11:  BP= 146/70 & tol meds well- denies CP, palpit, change in SOB or edema; labs showed BUN=22, Creat=1.4, K=3.3.Marland KitchenMarland Kitchen rec to take K20 Bid regularly! ~  12/12:  BP= 180/92 & medication compliance is suspect (didn't bring bottles to inspect); she is requesting change from Losartan back to [id number], labs ok & she is asked to f/u in 3-4 months to recheck BP (she never did) & consider f/u 2DEcho... CXR 12/12 showed  cardiomeg, clear lungs, NAD...  ~  10/13:  73mo ROV & BP= 162/92 supposedly on Labet200Bid, Amlod10, Losar100, Demadex20-2Qam & K20-?Bid; ?not taking Demadex or KCl?; refilled meds & we decided to change Amlod10 to Minoxidil10 due to edema ==> called back w/ c/o rapid heartbeat on Minoxidil therefore stopped & we restarted Amlodipine5 & increased Labetolol200-2Bid (refer to Cards for their input & needs f/u 2DEcho)... ~  11/13:  She had Cards eval by Cherly Hensen 11/13> he noted HBP, lack of compliance w/ med rx, inability to exercise due to knee arthritis; he rec that we try Clonidine & Hydralazine if additional meds needed for her BP;   ~  2DEcho 12/13 showed  modLVH w/ incr wall thickness, norm LVF w/ EF=65-70%, Gr1DD, mild AI, mildMRmod LAdil...  ~  12/13: on Amlod5, Labet200-2Bid, Losar100, Demadex20-2Qam & K20Bid=> BP=160/80 and her wt is down 5# to 233# today w/ decr edema noted; we discussed options and decided to add ALDACTONE 25mg /d to her regimen w/ ROV in another 44mo. ~  3/14: on Amlod5, Labet200-2Bid, Losar100, Demadex20-2Qam, Aldactone25/d, K20Bid; BP= 136/88; wt up 5# to 238#; exam w/ 3+pitting in LEs, Chest-clear, Heart-regular; we decided to add ZAROXYLN2.5mg MWF and f/u 58mo w/ labs... ~  4/14: on Amlod5, Labet200-2Bid, Losar100, Demadex20-2Qam, Aldactone25/d, Zaroxyln2.5-MWF, & K20Bid; BP= 128/70; wt is down 7# to 231#; exam shows decr edema ~2+now; Labs showed Creat up to 1.9 & we decr the Zaroxyln to MTh only...  VENOUS INSUFFICIENCY (ICD-459.81) & EDEMA - she is supposed to follow a low sodium diet, elevate legs, & wears support hose when able... continue Demadex 20mg - 2Qam & KCl... ~  12/12: she's been consuming way too much sodium & we reviewed a 2gm Na+ diet & wt reduction needed... ~  10/13:  ?if she's taking the Demadex, didn't brink bottles, lists 4 [facility] & most recent 2 haven't filled the diuretic... ~  3/14:  She says she is taking her meds faithfully- still w/ 3+pitting edema in  LEs...  OBESITY (ICD-278.00) - she has been counselled on a low carb, low fat, weight reducing diet... ~  weight 1/10 = 228# which is her peak and up 10# in the last yr... ~  weight 2/11 = 233# ~  weight 8/11 = 233#...  reminded of diet + exercise needed. ~  weight 12/11 = 236# ~  Weight 12/12 = 240#... rec [facility] vs wt watchers etc... ~  Weight 10/13 = 239# ~  Weight 12/13 = 233# ~  Weight 3/14 = 238# ~  Weight 4/14 = 231#  DIVERTICULOSIS OF COLON (ICD-562.10),  COLONIC POLYPS (ICD-211.3),  HEMORRHOIDS (ICD-455.6) - last colonoscopy 10/08 by DrStark showed divertics, hems, & several 3-75mm polyps= adenomatous w/ f/u planned in 5 yrs. ~  2/11:  she notes some constipation & is rec to take Miralax & Senakot-S. ~  8/11:  [place] written for hems... ~  10/13: she is due for another colonoscopy & is c/o pain across lower abd; Hg=11.4 and Sed=54; needs CT Abd&Pelvis> done 01/14/12> NAD... There was cardiomeg, & scarring at lung bases; left renal cyst, scat divertics w/o inflamm, s/o hyst, mild degen arthritis in spine, tortuous calcif Ao, NAD... ~  She had GI eval by DrStark> CT Abd 10/13 revealed scarring at lung bases, cardiomegaly, left renal cyst, divertics, s/p hyst, degen changes in spine, tortuous calcif Ao;  Colonoscopy 11/13 showed 24mm polypin asc colon, mod divertics, mod ext hems; Bx= tubular adenoma & f/u planned 5 yrs...   RENAL CYST (ICD-593.2) - incidentally noted left lower pole simple renal cyst...  DEGENERATIVE JOINT DISEASE (ICD-715.90) - known bilat knee arthritis w/ eval by DrNitka in 2003... bone-on-bone w/ cortisone shots attempted in 2005, and Synvisc in 2006... she takes MOBIC 7.5mg  Prn & TRAMADOL 50mg  Prn... ~  12/11: she has knee braces to help w/ ambulation; she is advised to f/u w/ Ortho & reconsider staged joint replacement operations... ~  12/12: we reviewed her severe OA situation w/ her; she will need bilat TKRs eventually & should  proceed, but she is holding off, offered 2nd opinion at [facility]... ~  BMD 9/13 by drNeal showed TScores +0.1 in Spine and -0.8 in left FemNeck.Marland KitchenMarland Kitchen  BACK PAIN, LUMBAR (ICD-724.2) - she has known DDD... ~  Shara Blazing tried Lyrica w/ some improvement in symptoms.  VITAMIN D DEFICIENCY (ICD-268.9) - Vit D level 9/08 = 5... started on Vit D 50,000 u weekly, but she stopped this on her own after several months and switched to Vit D 1000 u daily... ~  labs 1/10 showed Vit D level = 22... rec to incr OTC Vit D to 2000 u daily (never did). ~  labs 2/11 showed Vit D level = 21... rec to get on the 2000 u daily! ~  Labs 12/12 showed Vit d level = 15... rec to restart VitD 50000 u weekly & stay on this! ~  Labs 10/13 showed Vit D level = 42... rec to continue Vit D 50K weekly...  NEUROPATHY (ICD-355.9)  Hx of ANEMIA (ICD-285.9) - hx anemia from heavy menses before her hysterectomy in 1989... subseq mild anemia- multifactorial in nature... ~  labs 9/08 showed Hg= 11.8 ~  labs 1/10 showed Hg= 12.1 ~  labs 2/11 showed Hg= 11.9 ~  labs 12/11 showed Hg= 11.0, MCV= 87 ~  Labs 12/12 showed Hg= 11.9, MCV= 85 ~  Labs 10/13 showed Hg= 11.4, MCV= 86  Health Maintenance - she is supposed to take ASA 81mg /d,  Calcium Bid,  MVI daily, & Vit D 2000 u daily... she refuses Flu shots.   Past Surgical History  Procedure Laterality Date  . [place] and right oopherectomy  1998    Dr. Nori Riis for fibroids and cyst   SHE DID NOT BRING MED BOTTLES TO THE OFFICE AS REQUESTED>> Outpatient Encounter Prescriptions as of 07/29/2012  Medication Sig Dispense Refill  . amLODipine (NORVASC) 5 MG tablet Take 1 tablet (5 mg total) by mouth daily.  30 tablet  11  . aspirin 81 MG tablet Take 81 mg by mouth daily.        . calcium carbonate (OS-CAL - DOSED IN MG OF ELEMENTAL CALCIUM) 1250 MG tablet Take 2 tablets by mouth daily.      . clobetasol ointment (TEMOVATE) 0.05 % Use as directed      . hydrocortisone (ANUSOL-HC) 2.5 % rectal cream  Place 1 application rectally as needed.        . labetalol (NORMODYNE) 200 MG tablet Take 2 tablets (400 mg total) by mouth 2 (two) times daily.  120 tablet  6  . losartan (COZAAR) 100 MG tablet TAKE 1 TABLET BY MOUTH EVERY DAY  30 tablet  11  . meloxicam (MOBIC) 7.5 MG tablet Take 1 tablet (7.5 mg total) by mouth daily. As needed for arthritis pain  30 tablet  11  . metolazone (ZAROXOLYN) 2.5 MG tablet Take 1 tablet by mouth on Monday, wedensday and friday  30 tablet  1  . Multiple Vitamins-Minerals (MULTIVITAMIN PO) Take 1 tablet by mouth daily.        . potassium chloride SA (K-DUR,KLOR-CON) 20 MEQ tablet Take 20 mEq by mouth 2 (two) times daily.        Marland Kitchen spironolactone (ALDACTONE) 25 MG tablet Take 1 tablet (25 mg total) by mouth daily.  30 tablet  6  . torsemide (DEMADEX) 20 MG tablet Take 40 mg by mouth daily.        . traMADol (ULTRAM) 50 MG tablet Take 1 tablet (50 mg total) by mouth every 6 (six) hours as needed. Maximum dose= 8 tablets per day  90 tablet  6  . Vitamin D, Ergocalciferol, (DRISDOL) 50000 UNITS CAPS TAKE 1 CAPSULE  BY MOUTH EVERY 7 DAYS  4 capsule  6   No facility-administered encounter medications on file as of 07/29/2012.    Allergies  Allergen Reactions  . Minoxidil     Causes palpitations/unable to sleep  . Naproxen     REACTION: swelling    Current Medications, Allergies, Past Medical History, Past Surgical History, Family History, and Social History were reviewed in [employer] record.    Review of Systems         See HPI - all other systems neg except as noted...  The patient complains of dyspnea on exertion, muscle weakness, and difficulty walking.  The patient denies anorexia, fever, weight loss, weight gain, vision loss, decreased hearing, hoarseness, chest pain, syncope, peripheral edema, prolonged cough, headaches, hemoptysis, abdominal pain, melena, hematochezia, severe indigestion/heartburn, hematuria, incontinence, suspicious  skin lesions, transient blindness, depression, unusual weight change, abnormal bleeding, enlarged lymph nodes, and angioedema.   Objective:   Physical Exam      WD, Overweight, 67 y/o BF in NAD... GENERAL:  Alert & oriented; pleasant & cooperative... HEENT:  [place]/AT, EOM-wnl, PERRLA, EACs-clear, TMs-wnl, NOSE-clear, THROAT-clear & wnl. NECK:  Supple w/ fairROM; no JVD; normal carotid impulses w/o bruits; palp thyroid w/o nodules; no lymphadenopathy. CHEST:  Clear to P & A; without wheezes/ rales/ or rhonchi heard... HEART:  Regular Rhythm; without murmurs/ rubs/ or gallops detected... ABDOMEN:  Obese, soft & nontender; normal bowel sounds; no organomegaly or masses detected. EXT:  mod-severe arthritic changes; no varicose veins/ +venous insuffic/ 2+edema. NEURO:  CN's intact; motor testing normal; sensory testing normal; gait abn due to severe arthritis. DERM:  No lesions noted; no rash etc...  RADIOLOGY DATA:  Reviewed in the EPIC EMR & discussed w/ the patient...  LABORATORY DATA:  Reviewed in the EPIC EMR & discussed w/ the patient...   Assessment & Plan:    HBP>  Hx severe HBP & I believe she was noncompliant w/ her 4-5 med regimen; once again she did not bring bottles to office; 4/14> BP is now wnl and edema is sl diminished w/ addition of Zaroxyln but Cr up to 1.9 therefore decr Zarox to 2.5mg  MTh only...  VI/ Edema>  She MUST comply w/ low sodium restriction & she was given another 2gm Na+ diet sheet; reminded to elevate legs & wear support hose, and take all diuretics daily...   Obesity>  She's been totally ineffective on diet/ exercise; needs diet counseling wt watchers or something similar for help & she will consider her options and make her choice...  GI> Divertics, Polyps>  Stable, using [place] as needed; CT Abd/Pelvis was neg w/o acute changes & we will refer to drStark for further GI eval=> see above.  DJD, LBP>  This is one of her most signif issues- severe DJD knees  do not allow for suffic exercise/ activity & wt reduction is difficult; advised to reconsider TKRs because without it she will be wheelchair bound & immobile; offered 2nd opinion at [place] she will decide...  Vit D defic>  Severe Vit D defic, age 9, immobile, etc... Improved on Vit D 50K weekly, along w/ Calcium, MVI, and needs BMD measurement here...  Neuropathy>  Aware, likely related to her DDD but she states not that much back discomfort at this point...  Anemia>  Hg is approx stable at 11.4 at present...   Patient's Medications  New Prescriptions   No medications on file  Previous Medications   AMLODIPINE (NORVASC) 5 MG TABLET  Take 1 tablet (5 mg total) by mouth daily.   ASPIRIN 81 MG TABLET    Take 81 mg by mouth daily.     CALCIUM CARBONATE (OS-CAL - DOSED IN MG OF ELEMENTAL CALCIUM) 1250 MG TABLET    Take 2 tablets by mouth daily.   CLOBETASOL OINTMENT (TEMOVATE) 0.05 %    Use as directed   HYDROCORTISONE (ANUSOL-HC) 2.5 % RECTAL CREAM    Place 1 application rectally as needed.     LABETALOL (NORMODYNE) 200 MG TABLET    Take 2 tablets (400 mg total) by mouth 2 (two) times daily.   LOSARTAN (COZAAR) 100 MG TABLET    TAKE 1 TABLET BY MOUTH EVERY DAY   MELOXICAM (MOBIC) 7.5 MG TABLET    Take 1 tablet (7.5 mg total) by mouth daily. As needed for arthritis pain   MULTIPLE VITAMINS-MINERALS (MULTIVITAMIN PO)    Take 1 tablet by mouth daily.     POTASSIUM CHLORIDE SA (K-DUR,KLOR-CON) 20 MEQ TABLET    Take 20 mEq by mouth 2 (two) times daily.     SPIRONOLACTONE (ALDACTONE) 25 MG TABLET    Take 1 tablet (25 mg total) by mouth daily.   TORSEMIDE (DEMADEX) 20 MG TABLET    Take 40 mg by mouth daily.     TRAMADOL (ULTRAM) 50 MG TABLET    Take 1 tablet (50 mg total) by mouth every 6 (six) hours as needed. Maximum dose= 8 tablets per day   VITAMIN D, ERGOCALCIFEROL, (DRISDOL) 50000 UNITS CAPS    TAKE 1 CAPSULE BY MOUTH EVERY 7 DAYS  Modified Medications   Modified Medication Previous  Medication   METOLAZONE (ZAROXOLYN) 2.5 MG TABLET metolazone (ZAROXOLYN) 2.5 MG tablet      Take 1 tablet by mouth on Monday and thursday    Take 1 tablet by mouth on Monday, wedensday and friday  Discontinued Medications   No medications on file

## 2012-07-30 ENCOUNTER — Ambulatory Visit: Payer: Medicare Other | Admitting: Pulmonary Disease

## 2012-09-01 ENCOUNTER — Encounter: Payer: Self-pay | Admitting: Pulmonary Disease

## 2012-09-01 ENCOUNTER — Ambulatory Visit (INDEPENDENT_AMBULATORY_CARE_PROVIDER_SITE_OTHER): Payer: Medicare Other | Admitting: Pulmonary Disease

## 2012-09-01 ENCOUNTER — Other Ambulatory Visit (INDEPENDENT_AMBULATORY_CARE_PROVIDER_SITE_OTHER): Payer: Medicare Other

## 2012-09-01 VITALS — BP 142/72 | HR 80 | Temp 99.3°F | Ht 65.0 in | Wt 230.4 lb

## 2012-09-01 DIAGNOSIS — M25569 Pain in unspecified knee: Secondary | ICD-10-CM

## 2012-09-01 DIAGNOSIS — M25561 Pain in right knee: Secondary | ICD-10-CM

## 2012-09-01 DIAGNOSIS — R609 Edema, unspecified: Secondary | ICD-10-CM

## 2012-09-01 DIAGNOSIS — M1990 Unspecified osteoarthritis, unspecified site: Secondary | ICD-10-CM

## 2012-09-01 DIAGNOSIS — G589 Mononeuropathy, unspecified: Secondary | ICD-10-CM

## 2012-09-01 DIAGNOSIS — M545 Low back pain: Secondary | ICD-10-CM

## 2012-09-01 DIAGNOSIS — M25562 Pain in left knee: Secondary | ICD-10-CM

## 2012-09-01 DIAGNOSIS — I872 Venous insufficiency (chronic) (peripheral): Secondary | ICD-10-CM

## 2012-09-01 DIAGNOSIS — I10 Essential (primary) hypertension: Secondary | ICD-10-CM

## 2012-09-01 DIAGNOSIS — N281 Cyst of kidney, acquired: Secondary | ICD-10-CM

## 2012-09-01 NOTE — Patient Instructions (Addendum)
Today we updated your med list in our EPIC system...    Continue your current medications the same...  Today we did your follow up blood work...    We will contact you w/ the results when available...   Keep up the good work w/ salt/ sodium  restriction & your DIET...  Call for any questions...  Let's plan a follow up visit in 6mo, sooner if needed for problems.Marland KitchenMarland Kitchen

## 2012-09-02 LAB — BASIC METABOLIC PANEL
BUN: 38 mg/dL — ABNORMAL HIGH (ref 6–23)
Creatinine, Ser: 2.1 mg/dL — ABNORMAL HIGH (ref 0.4–1.2)
GFR: 29.89 mL/min — ABNORMAL LOW (ref >60.00)
Glucose, Bld: 77 mg/dL (ref 70–99)
Potassium: 3.7 mEq/L (ref 3.5–5.1)

## 2012-09-04 ENCOUNTER — Telehealth: Payer: Self-pay | Admitting: Pulmonary Disease

## 2012-09-04 NOTE — Progress Notes (Signed)
Quick Note:  lmom with results per pt request ______

## 2012-09-04 NOTE — Telephone Encounter (Signed)
Notes Recorded by Noralee Space, MD on 09/04/2012 at 7:47 AM Please notify patient>  Chems show worsening Creat-2.1 despite cutting back on the zaroxylyn... Rec> needs to STOP the Zaroxylyn (metolzalone) NOW... Continue Demadex & aldactone... Remember> NO SALT, incr water intake...      Called and left detailed msg on VM per the pt's request

## 2012-09-04 NOTE — Telephone Encounter (Addendum)
lmtcb for pt   See other phone note dated today

## 2012-09-06 ENCOUNTER — Encounter: Payer: Self-pay | Admitting: Pulmonary Disease

## 2012-09-06 NOTE — Progress Notes (Signed)
Subjective:    Patient ID: [name], female    DOB: 04-26-1945, 67 y.o.   MRN: [id number]  HPI 67 y/o BF here for a follow up visit... she has multiple medical problems as noted below, and a hx of poor compliance w/ med Rx and office follow-ups...   ~  January 10, 2012:  42mo ROV & [name] has 3 main problems today> BP, Edema, Abd pain>>    1) HBP> supposed to be on [name], [name], [name], Demadex20-2Qam & K20-?Bid; a call to her mult pharmacies indicates that she hasn't been filling the diuretic or KCl at all; exam shows BP= 160/90 and she has chrVI in legs w/ 4+pitting in ankles; once again asked to take all meds every day, and we decied to stop the [name] due to edema & substitute Minoxidil10mg /d;  Plan ROV 73mo w/ all med bottles to recheck...    2) VI, Edema> she has chronic lymphedema in LEs w/ 4+pitting in ankles today; ?it appears from call to her [employer] that she is not taking the [name]? Or the KCl; We discussed Rx w/ NO SALT, elevation, support hose vs ACE wraps, & take her diuretic daily... ?any option for a Lymphedema clinic? Plan ROV 64mo w/ all med bottles to recheck...     3) Lower Abd Pain> c/o vague 64mo hx of lower abd discomfort across the suprapubic area, assoc w/ some constip; she denies other abd pain, n/v, swelling, gas, etc; she notes hard stools, some straining, denies blood; she has never had CT Abd & we decided to proceed w/ CT and refer to [name] for colonoscopy due; Add Miralax & Senakot-S...    4) As noted she has numerous additional medical problems> SEE BELOW; and hx of poor compliance w/ medications and medical follow up... We reviewed prob list, meds, xrays and labs> see below for updates >> OK Flu vaccine today... LABS 10/13:  FLP- all parameters at goals;  Chems- ok x K=3.3 & sl incr LFTs;  CBC- mild anemia w/ Hg=11.4;  TSH=1.03;  VitD=42;  BNP=58;  Sed=54; UA- clear. CT Abd&Pelvis> done 01/14/12> NAD... There was cardiomeg, & scarring at lung bases; left  renal cyst, scat divertics w/o inflamm, s/o hyst, mild degen arthritis in spine, tortuous calcif Ao, NAD...  ADDENDUM 02/03/12>> pt has called several times since 01/10/12 OV where we stopped her Amlodipine10 due to edema & started Minoxidil10;  She saw [name] 10/10 & office BP=152/80;  She called 10/14 c/o rapid heartbeat & "edema" from the [name], her Pharmacist told her to stop it;  Then her GYN said BP was 210/110 => 160/84;  We reviewed all her meds and rec the following>> restart Amlodipine at 5mg /d, increase [name] 200mg - to 2tabsBid, keep Losartan 100mg /d, keep Demadex20- 2Qam, keep K20Bid; reminded to take all meds regularly every day & bring all bottles to med visits; and we will refer to Cards for their suggestions (needs f/u 2DEcho).  ~  March 23, 2012:  67mo ROV & f/u BP on new regimen as outlined> Amlod5, Labet200-2Bid, [name], Demadex20-2Qam & K20Bid=> BP=160/80 and her wt is down 5# to 233# today w/ decr edema noted; we discussed options and decided to add [name] 25mg /d to her regimen w/ ROV in another 24mo...    She had Cards eval by [name] 11/13> he noted HBP, lack of compliance w/ med rx, inability to exercise due to knee arthritis; he rec that we try Clonidine & Hydralazine if additional meds needed for her BP;  2DEcho 12/13  showed modLVH w/ incr wall thickness, norm LVF w/ EF=65-70%, Gr1DD, mild AI, mildMRmod LAdil...     She had GI eval by [name]> CT Abd 10/13 revealed scarring at lung bases, cardiomegaly, left renal cyst, divertics, s/p hyst, degen changes in spine, tortuous calcif Ao;  Colonoscopy 11/13 showed 89mm polypin asc colon, mod divertics, mod ext hems; Bx= tubular adenoma & f/u planned 5 yrs...   ~  June 29, 2012:  76mo ROV & follow up BP & edema> last visit we added [name] 25mg /d to her [name] 20mg -2Qam and K20bid... Unfortunately over the last 70mo she has gained 5# to 238# and continues to display 3+edema in LEs... States she is on a low salt diet,  elevating legs, & wearing support hose; she has trouble ambulating or exercising due to pain in her knees "they are so stiff" she says; [name] seems to help some...  We discussed options & decided to add [name] 2.5mg  MWF, continue other meds and salt restriction, etc... She will ret in 1 month w/ labs...   ~  July 29, 2012:  68mo ROV on [name].5-MWF, plus her Demadex20-2Qam, Aldactone25Qpm, & K20Bid>> She has lost 7# of fluid down to 231# today and she can tell her legs are better & she further notes that her legs go down a lot at night, but edema builds up during the day; we reviewed NO SALT, elevation & support hose vs ACE wrap;  BMET shows- K=3.7, BUN=38, Cr=1.9 (sl worse); Therefore we decided to [facility] the [name] 2.5mg  just 2d per wk= MTh...  ~  Sep 01, 2012:  11mo ROV & [name] notes that her legs are some better even though she has only lost 1# by the scales down to 230# today;  She remains on Demadex20-2Qam, Aldactone25, Zaroxyln2.5MTh, K20Bid; BMet today shows K=3.7, BUN=38, Cr=2.1 and we are forced to STOP the [name] now & incr free water intake;  BP= 142/72 on the diuretics plus Labetolol200-2Bid, Amlodipine5, & Losartan100...  She still has 2+ tissue edema & other than NO SALT, elevation, support hose- we have no where else to turn => proceed w/ RENAL CONSULT...           Problem List:   HYPERTENSION (ICD-401.9) - on [name] 200mg Bid,  NORVASC 10mg /d,  LOSARTAN 100mg /d,  DEMEDEX 20mg - 2tabs/d,  KCl 16mEqBid... hx severe HBP in the past w/ eval from Nephrology- [name] in 2000 w/ neg MRA Abd- no RAS, +tort AbdAo.Marland KitchenMarland KitchenNOTE: renal function normal,  cholesterol normal... ?INTOL to [name] in past. ~  2DEcho 1999 showed mild LVH, EF= 60%, mild MV thickening & MR, AoV sclerosis... ~  2DEcho 2006 showed mod concentric LVH, AoV sclerosis w/ mild AI, thickened MV w/ mild MR. ~  baseline CXR w/ cardiomegaly, chr bronchitic changes, prom R superior mediast soft tissue w/o ch. ~  baseline EKG w/  NSR, LAD, incr voltage, late transition... ~  2/11: she requested change from Diovan to generic> Losartan 100mg /d. ~  12/11:  BP= 146/70 & tol meds well- denies CP, palpit, change in SOB or edema; labs showed BUN=22, Creat=1.4, K=3.3.Marland KitchenMarland Kitchen rec to take K20 Bid regularly! ~  12/12:  BP= 180/92 & medication compliance is suspect (didn't bring bottles to inspect); she is requesting change from Losartan back to [id number], labs ok & she is asked to f/u in 3-4 months to recheck BP (she never did) & consider f/u 2DEcho... CXR 12/12 showed cardiomeg, clear lungs, NAD...  ~  10/13:  28mo ROV & BP= 162/92 supposedly on [name], [name],  [name], Demadex20-2Qam & K20-?Bid; ?not taking [name] or KCl?; refilled meds & we decided to change [name] to Minoxidil10 due to edema ==> called back w/ c/o rapid heartbeat on [name] therefore stopped & we restarted Amlodipine5 & increased Labetolol200-2Bid (refer to Cards for their input & needs f/u 2DEcho)... ~  11/13:  She had Cards eval by [name] 11/13> he noted HBP, lack of compliance w/ med rx, inability to exercise due to knee arthritis; he rec that we try Clonidine & Hydralazine if additional meds needed for her BP;   ~  2DEcho 12/13 showed modLVH w/ incr wall thickness, norm LVF w/ EF=65-70%, Gr1DD, mild AI, mildMRmod LAdil...  ~  12/13: on Amlod5, Labet200-2Bid, [name], Demadex20-2Qam & K20Bid=> BP=160/80 and her wt is down 5# to 233# today w/ decr edema noted; we discussed options and decided to add [name] 25mg /d to her regimen w/ ROV in another 33mo. ~  3/14: on Amlod5, Labet200-2Bid, [name], Demadex20-2Qam, Aldactone25/d, K20Bid; BP= 136/88; wt up 5# to 238#; exam w/ 3+pitting in LEs, Chest-clear, Heart-regular; we decided to add ZAROXYLN2.5mg MWF and f/u 28mo w/ labs... ~  4/14: on Amlod5, Labet200-2Bid, [name], Demadex20-2Qam, Aldactone25/d, Zaroxyln2.5-MWF, & K20Bid; BP= 128/70; wt is down 7# to 231#; exam shows decr edema ~2+now; Labs showed Creat up  to 1.9 & we decr the [name] to MTh only... ~  5/14: on Amlod5, Labet200-2Bid, [name], Demadex20-2Qam, Aldactone25/d, Zaroxyln2.5-MTh, & K20Bid; BP= 142/72; wt is down 1# to 230#; exam w/ 2+edema bilat; Labs show Creat up to 2.1 & we will STOP the [name] & refer to Nephrology...  VENOUS INSUFFICIENCY (ICD-459.81) & EDEMA - she is supposed to follow a low sodium diet, elevate legs, & wears support hose when able... continue [name] 20mg - 2Qam & KCl... ~  12/12: she's been consuming way too much sodium & we reviewed a 2gm Na+ diet & wt reduction needed... ~  10/13:  ?if she's taking the [name], didn't brink bottles, lists 4 [facility] & most recent 2 haven't filled the diuretic... ~  3/14:  She says she is taking her meds faithfully- still w/ 3+pitting edema in LEs...  OBESITY (ICD-278.00) - she has been counselled on a low carb, low fat, weight reducing diet... ~  weight 1/10 = 228# which is her peak and up 10# in the last yr... ~  weight 2/11 = 233# ~  weight 8/11 = 233#...  reminded of diet + exercise needed. ~  weight 12/11 = 236# ~  Weight 12/12 = 240#... rec [facility] vs wt watchers etc... ~  Weight 10/13 = 239# ~  Weight 12/13 = 233# ~  Weight 3/14 = 238# ~  Weight 4/14 = 231# ~  Weight 5/14 = 230#  DIVERTICULOSIS OF COLON (ICD-562.10),  COLONIC POLYPS (ICD-211.3),  HEMORRHOIDS (ICD-455.6) - last colonoscopy 10/08 by [name] showed divertics, hems, & several 3-89mm polyps= adenomatous w/ f/u planned in 5 yrs. ~  2/11:  she notes some constipation & is rec to take Miralax & Senakot-S. ~  8/11:  [place] written for hems... ~  10/13: she is due for another colonoscopy & is c/o pain across lower abd; Hg=11.4 and Sed=54; needs CT Abd&Pelvis> done 01/14/12> NAD... There was cardiomeg, & scarring at lung bases; left renal cyst, scat divertics w/o inflamm, s/o hyst, mild degen arthritis in spine, tortuous calcif Ao, NAD... ~  She had GI eval by [name]> CT Abd 10/13  revealed scarring at lung bases, cardiomegaly, left renal cyst, divertics, s/p hyst, degen changes in  spine, tortuous calcif Ao;  Colonoscopy 11/13 showed 17mm polypin asc colon, mod divertics, mod ext hems; Bx= tubular adenoma & f/u planned 5 yrs...   RENAL CYST (ICD-593.2) - incidentally noted left lower pole simple renal cyst... RENAL INSUFFICIENCY >>  ~  10/13:  Baseline Creat = 1.0-1.1 (see labs in EPIC)... ~  4/14:  Creat incr to 1.9 on Demadex20-2Qam, Aldactone25/d, Zaroxyln2.5-MWF, & K20Bid; Pt asked to decr the [name] to 2.5 on MTh only... ~  5/14:  Creat incr to 2.1 on Demadex20-2Qam, Aldactone25/d, Zaroxyln2.5-MTh, & K20Bid; Pt asked to STOP the [name]  & we will refer to Nephrology for RI, HBP, refractory edema...  DEGENERATIVE JOINT DISEASE (ICD-715.90) - known bilat knee arthritis w/ eval by DrNitka in 2003... bone-on-bone w/ cortisone shots attempted in 2005, and Synvisc in 2006... she takes [name] 7.5mg  Prn & TRAMADOL 50mg  Prn... ~  12/11: she has knee braces to help w/ ambulation; she is advised to f/u w/ Ortho & reconsider staged joint replacement operations... ~  12/12: we reviewed her severe OA situation w/ her; she will need bilat TKRs eventually & should proceed, but she is holding off, offered [facility]nd opinion at [facility]... ~  BMD 9/13 by drNeal showed TScores +0.1 in Spine and -0.8 in left FemNeck...  BACK PAIN, LUMBAR (ICD-724.2) - she has known DDD... ~  Shara Blazing tried Lyrica w/ some improvement in symptoms.  VITAMIN D DEFICIENCY (ICD-268.9) - Vit D level 9/08 = 5... started on Vit D 50,000 u weekly, but she stopped this on her own after several months and switched to Vit D 1000 u daily... ~  labs 1/10 showed Vit D level = 22... rec to incr OTC Vit D to 2000 u daily (never did). ~  labs 2/11 showed Vit D level = 21... rec to get on the 2000 u daily! ~  Labs 12/12 showed Vit d level = 15... rec to restart VitD 50000 u weekly & stay on this! ~  Labs 10/13 showed Vit D level =  42... rec to continue Vit D 50K weekly...  NEUROPATHY (ICD-355.9)  Hx of ANEMIA (ICD-285.9) - hx anemia from heavy menses before her hysterectomy in 1989... subseq mild anemia- multifactorial in nature... ~  labs 9/08 showed Hg= 11.8 ~  labs 1/10 showed Hg= 12.1 ~  labs 2/11 showed Hg= 11.9 ~  labs 12/11 showed Hg= 11.0, MCV= 87 ~  Labs 12/12 showed Hg= 11.9, MCV= 85 ~  Labs 10/13 showed Hg= 11.4, MCV= 86  Health Maintenance - she is supposed to take ASA 81mg /d,  Calcium Bid,  MVI daily, & Vit D 2000 u daily... she refuses Flu shots.   Past Surgical History  Procedure Laterality Date  . [place] and right oopherectomy  1998    Dr. Nori Riis for fibroids and cyst   SHE DID NOT BRING MED BOTTLES TO THE OFFICE AS REQUESTED>> Outpatient Encounter Prescriptions as of 09/01/2012  Medication Sig Dispense Refill  . amLODipine (NORVASC) 5 MG tablet Take 1 tablet (5 mg total) by mouth daily.  30 tablet  11  . aspirin 81 MG tablet Take 81 mg by mouth daily.        . calcium carbonate (OS-CAL - DOSED IN MG OF ELEMENTAL CALCIUM) 1250 MG tablet Take 2 tablets by mouth daily.      . clobetasol ointment (TEMOVATE) 0.05 % Use as directed      . hydrocortisone (ANUSOL-HC) 2.5 % rectal cream Place 1 application rectally as needed.        Marland Kitchen  labetalol (NORMODYNE) 200 MG tablet Take 2 tablets (400 mg total) by mouth 2 (two) times daily.  120 tablet  6  . losartan (COZAAR) 100 MG tablet TAKE 1 TABLET BY MOUTH EVERY DAY  30 tablet  11  . meloxicam ([name]) 7.5 MG tablet Take 1 tablet (7.5 mg total) by mouth daily. As needed for arthritis pain  30 tablet  11  . metolazone (ZAROXOLYN) 2.5 MG tablet Take 1 tablet by mouth on Monday and thursday      . Multiple Vitamins-Minerals (MULTIVITAMIN PO) Take 1 tablet by mouth daily.        . potassium chloride SA (K-DUR,KLOR-CON) 20 MEQ tablet Take 20 mEq by mouth 2 (two) times daily.        Marland Kitchen spironolactone ([name]) 25 MG tablet Take 1 tablet (25 mg total) by mouth daily.   30 tablet  6  . torsemide ([name]) 20 MG tablet Take 40 mg by mouth daily.        . traMADol (ULTRAM) 50 MG tablet Take 1 tablet (50 mg total) by mouth every 6 (six) hours as needed. Maximum dose= 8 tablets per day  90 tablet  6  . Vitamin D, Ergocalciferol, (DRISDOL) 50000 UNITS CAPS TAKE 1 CAPSULE BY MOUTH EVERY 7 DAYS  4 capsule  6   No facility-administered encounter medications on file as of 09/01/2012.    Allergies  Allergen Reactions  . [name]     Causes palpitations/unable to sleep  . Naproxen     REACTION: swelling    Current Medications, Allergies, Past Medical History, Past Surgical History, Family History, and Social History were reviewed in [employer] record.    Review of Systems         See HPI - all other systems neg except as noted...  The patient complains of dyspnea on exertion, muscle weakness, and difficulty walking.  The patient denies anorexia, fever, weight loss, weight gain, vision loss, decreased hearing, hoarseness, chest pain, syncope, peripheral edema, prolonged cough, headaches, hemoptysis, abdominal pain, melena, hematochezia, severe indigestion/heartburn, hematuria, incontinence, suspicious skin lesions, transient blindness, depression, unusual weight change, abnormal bleeding, enlarged lymph nodes, and angioedema.   Objective:   Physical Exam      WD, Overweight, 67 y/o BF in NAD... GENERAL:  Alert & oriented; pleasant & cooperative... HEENT:  [place]/AT, EOM-wnl, PERRLA, EACs-clear, TMs-wnl, NOSE-clear, THROAT-clear & wnl. NECK:  Supple w/ fairROM; no JVD; normal carotid impulses w/o bruits; palp thyroid w/o nodules; no lymphadenopathy. CHEST:  Clear to P & A; without wheezes/ rales/ or rhonchi heard... HEART:  Regular Rhythm; without murmurs/ rubs/ or gallops detected... ABDOMEN:  Obese, soft & nontender; normal bowel sounds; no organomegaly or masses detected. EXT:  mod-severe arthritic changes; no varicose veins/ +venous  insuffic/ 2+edema. NEURO:  CN's intact; motor testing normal; sensory testing normal; gait abn due to severe arthritis. DERM:  No lesions noted; no rash etc...  RADIOLOGY DATA:  Reviewed in the EPIC EMR & discussed w/ the patient...  LABORATORY DATA:  Reviewed in the EPIC EMR & discussed w/ the patient...   Assessment & Plan:    HBP>  Hx severe HBP & I believe she was noncompliant w/ her 4-5 med regimen; once again she did not bring bottles to office; 4/14> BP is upper lim of normal but edema persists and Cr is up to 2.1- STOP [name] & refer to Nephrology.  VI/ Edema>  She MUST comply w/ low sodium restriction & she was given another  2gm Na+ diet sheet; reminded to elevate legs & wear support hose, and we adjusted meds & will refer to Nephrology...  Renal Insuffic>  Due to her Diuretic therapy (baseline Cr=1.1), up to 1.9, now 2.1 & we are forced to STOP [name] & refer as above...   Obesity>  She's been totally ineffective on diet/ exercise; needs diet counseling wt watchers or something similar for help & she will consider her options and make her choice...  GI> Divertics, Polyps>  Stable, using [place] as needed; CT Abd/Pelvis was neg w/o acute changes & we will refer to [name] for further GI eval=> see above.  DJD, LBP>  This is one of her most signif issues- severe DJD knees do not allow for suffic exercise/ activity & wt reduction is difficult; advised to reconsider TKRs because without it she will be wheelchair bound & immobile; offered 2nd opinion at [place] she will decide...  Vit D defic>  Severe Vit D defic, age 23, immobile, etc... Improved on Vit D 50K weekly, along w/ Calcium, MVI, and needs BMD measurement here...  Neuropathy>  Aware, likely related to her DDD but she states not that much back discomfort at this point...  Anemia>  Hg is approx stable at 11.4 at present...   Patient's Medications  New Prescriptions   No medications on file  Previous Medications    AMLODIPINE (NORVASC) 5 MG TABLET    Take 1 tablet (5 mg total) by mouth daily.   ASPIRIN 81 MG TABLET    Take 81 mg by mouth daily.     CALCIUM CARBONATE (OS-CAL - DOSED IN MG OF ELEMENTAL CALCIUM) 1250 MG TABLET    Take 2 tablets by mouth daily.   CLOBETASOL OINTMENT (TEMOVATE) 0.05 %    Use as directed   HYDROCORTISONE (ANUSOL-HC) 2.5 % RECTAL CREAM    Place 1 application rectally as needed.     LABETALOL (NORMODYNE) 200 MG TABLET    Take 2 tablets (400 mg total) by mouth 2 (two) times daily.   LOSARTAN (COZAAR) 100 MG TABLET    TAKE 1 TABLET BY MOUTH EVERY DAY   MELOXICAM ([name]) 7.5 MG TABLET    Take 1 tablet (7.5 mg total) by mouth daily. As needed for arthritis pain   METOLAZONE (ZAROXOLYN) 2.5 MG TABLET    Take 1 tablet by mouth on Monday and thursday   MULTIPLE VITAMINS-MINERALS (MULTIVITAMIN PO)    Take 1 tablet by mouth daily.     POTASSIUM CHLORIDE SA (K-DUR,KLOR-CON) 20 MEQ TABLET    Take 20 mEq by mouth 2 (two) times daily.     SPIRONOLACTONE ([name]) 25 MG TABLET    Take 1 tablet (25 mg total) by mouth daily.   TORSEMIDE ([name]) 20 MG TABLET    Take 40 mg by mouth daily.     TRAMADOL (ULTRAM) 50 MG TABLET    Take 1 tablet (50 mg total) by mouth every 6 (six) hours as needed. Maximum dose= 8 tablets per day   VITAMIN D, ERGOCALCIFEROL, (DRISDOL) 50000 UNITS CAPS    TAKE 1 CAPSULE BY MOUTH EVERY 7 DAYS  Modified Medications   No medications on file  Discontinued Medications   No medications on file

## 2012-09-08 ENCOUNTER — Other Ambulatory Visit: Payer: Self-pay | Admitting: Pulmonary Disease

## 2012-09-08 DIAGNOSIS — N289 Disorder of kidney and ureter, unspecified: Secondary | ICD-10-CM

## 2012-09-08 DIAGNOSIS — R609 Edema, unspecified: Secondary | ICD-10-CM

## 2012-09-15 ENCOUNTER — Telehealth: Payer: Self-pay | Admitting: Pulmonary Disease

## 2012-09-15 DIAGNOSIS — R609 Edema, unspecified: Secondary | ICD-10-CM

## 2012-09-15 NOTE — Telephone Encounter (Signed)
ATC [name] back at number provided, phone rang several times with no option to leave message [facility]

## 2012-09-16 NOTE — Telephone Encounter (Signed)
Called, spoke with [name].  Order was sent on 01/22/12 to place pt on waiting list at [facility] for [facility] [place] [facility]. Per [name], they can only accept 3 pts at one time.  It is now up to pt's name.  Pt would like to start the 2nd wk of July. Will need new order faxed to her attn to eval and tx for lymphadema as the previous order has expired. Order placed to [facility] to fax.   [name] aware this will be faxed to given fax #. She verbalized understanding and voiced no further questions or concerns at this time.

## 2012-10-08 ENCOUNTER — Other Ambulatory Visit: Payer: Self-pay | Admitting: Pulmonary Disease

## 2012-11-03 ENCOUNTER — Ambulatory Visit: Payer: Medicare Other | Admitting: Pulmonary Disease

## 2012-12-03 ENCOUNTER — Other Ambulatory Visit: Payer: Self-pay | Admitting: Pulmonary Disease

## 2013-01-18 ENCOUNTER — Other Ambulatory Visit: Payer: Self-pay | Admitting: Pulmonary Disease

## 2013-02-24 ENCOUNTER — Other Ambulatory Visit: Payer: Self-pay | Admitting: Pulmonary Disease

## 2013-05-24 ENCOUNTER — Telehealth: Payer: Self-pay | Admitting: Pulmonary Disease

## 2013-05-24 MED ORDER — HYDROCORTISONE 2.5 % RE CREA: 1.0000 "application " | TOPICAL_CREAM | RECTAL | Status: DC | PRN

## 2013-05-24 NOTE — Telephone Encounter (Signed)
Spoke with the pt and she states that she is needing a refill on anusol cream. Refill sent. Baxter Bing, CMA

## 2013-05-24 NOTE — Telephone Encounter (Signed)
lmomtcb x1 

## 2013-05-24 NOTE — Telephone Encounter (Signed)
Pt returned call.  [name] ° °

## 2013-05-24 NOTE — Telephone Encounter (Signed)
[email or web address].Jennifer Castillo, CMA  

## 2013-07-29 ENCOUNTER — Other Ambulatory Visit: Payer: Self-pay | Admitting: Orthopedic Surgery

## 2013-07-29 DIAGNOSIS — M545 Low back pain, unspecified: Secondary | ICD-10-CM

## 2013-08-07 ENCOUNTER — Ambulatory Visit
Admission: RE | Admit: 2013-08-07 | Discharge: 2013-08-07 | Disposition: A | Discharge status: Home / Self Care | Payer: Medicare Other | Source: Ambulatory Visit | Attending: Orthopedic Surgery | Admitting: Orthopedic Surgery

## 2013-08-07 DIAGNOSIS — M545 Low back pain, unspecified: Secondary | ICD-10-CM

## 2013-10-05 ENCOUNTER — Other Ambulatory Visit: Payer: Self-pay | Admitting: Pulmonary Disease

## 2013-11-15 ENCOUNTER — Other Ambulatory Visit: Payer: Self-pay | Admitting: Pulmonary Disease

## 2013-12-16 ENCOUNTER — Other Ambulatory Visit: Payer: Self-pay | Admitting: Pulmonary Disease

## 2014-04-25 ENCOUNTER — Emergency Department (HOSPITAL_COMMUNITY)
Admission: EM | Admit: 2014-04-25 | Discharge: 2014-04-25 | Disposition: A | Discharge status: Home / Self Care | Payer: Medicare Other | Attending: Emergency Medicine | Admitting: Emergency Medicine

## 2014-04-25 ENCOUNTER — Encounter (HOSPITAL_COMMUNITY): Payer: Self-pay | Admitting: Emergency Medicine

## 2014-04-25 ENCOUNTER — Emergency Department (HOSPITAL_COMMUNITY): Payer: Medicare Other

## 2014-04-25 DIAGNOSIS — N390 Urinary tract infection, site not specified: Secondary | ICD-10-CM | POA: Insufficient documentation

## 2014-04-25 DIAGNOSIS — Z7982 Long term (current) use of aspirin: Secondary | ICD-10-CM | POA: Insufficient documentation

## 2014-04-25 DIAGNOSIS — Z79899 Other long term (current) drug therapy: Secondary | ICD-10-CM | POA: Diagnosis not present

## 2014-04-25 DIAGNOSIS — R05 Cough: Secondary | ICD-10-CM | POA: Insufficient documentation

## 2014-04-25 DIAGNOSIS — E559 Vitamin D deficiency, unspecified: Secondary | ICD-10-CM | POA: Insufficient documentation

## 2014-04-25 DIAGNOSIS — Z8669 Personal history of other diseases of the nervous system and sense organs: Secondary | ICD-10-CM | POA: Diagnosis not present

## 2014-04-25 DIAGNOSIS — Z791 Long term (current) use of non-steroidal anti-inflammatories (NSAID): Secondary | ICD-10-CM | POA: Diagnosis not present

## 2014-04-25 DIAGNOSIS — B9689 Other specified bacterial agents as the cause of diseases classified elsewhere: Secondary | ICD-10-CM | POA: Diagnosis not present

## 2014-04-25 DIAGNOSIS — R509 Fever, unspecified: Secondary | ICD-10-CM | POA: Diagnosis not present

## 2014-04-25 DIAGNOSIS — Z8739 Personal history of other diseases of the musculoskeletal system and connective tissue: Secondary | ICD-10-CM | POA: Diagnosis not present

## 2014-04-25 DIAGNOSIS — E669 Obesity, unspecified: Secondary | ICD-10-CM | POA: Insufficient documentation

## 2014-04-25 DIAGNOSIS — I517 Cardiomegaly: Secondary | ICD-10-CM | POA: Diagnosis not present

## 2014-04-25 DIAGNOSIS — I10 Essential (primary) hypertension: Secondary | ICD-10-CM | POA: Diagnosis not present

## 2014-04-25 DIAGNOSIS — R0989 Other specified symptoms and signs involving the circulatory and respiratory systems: Secondary | ICD-10-CM | POA: Diagnosis not present

## 2014-04-25 DIAGNOSIS — D649 Anemia, unspecified: Secondary | ICD-10-CM | POA: Insufficient documentation

## 2014-04-25 DIAGNOSIS — R000 Tachycardia, unspecified: Secondary | ICD-10-CM | POA: Insufficient documentation

## 2014-04-25 DIAGNOSIS — R059 Cough, unspecified: Secondary | ICD-10-CM

## 2014-04-25 DIAGNOSIS — Z87891 Personal history of nicotine dependence: Secondary | ICD-10-CM | POA: Insufficient documentation

## 2014-04-25 DIAGNOSIS — Z8719 Personal history of other diseases of the digestive system: Secondary | ICD-10-CM | POA: Insufficient documentation

## 2014-04-25 DIAGNOSIS — R0602 Shortness of breath: Secondary | ICD-10-CM | POA: Diagnosis not present

## 2014-04-25 DIAGNOSIS — Z86010 Personal history of colonic polyps: Secondary | ICD-10-CM | POA: Diagnosis not present

## 2014-04-25 LAB — URINALYSIS, ROUTINE W REFLEX MICROSCOPIC
BILIRUBIN URINE: NEGATIVE
Glucose, UA: NEGATIVE mg/dL
Ketones, ur: NEGATIVE mg/dL
NITRITE: NEGATIVE
Protein, ur: >300 mg/dL — AB
Specific Gravity, Urine: 1.02 (ref 1.005–1.030)
Urobilinogen, UA: 1 mg/dL (ref 0.0–1.0)
pH: 5 (ref 5.0–8.0)

## 2014-04-25 LAB — URINE MICROSCOPIC-ADD ON

## 2014-04-25 LAB — CBC WITH DIFFERENTIAL/PLATELET
BASOS ABS: 0 10*3/uL (ref 0.0–0.1)
BASOS PCT: 0 % (ref 0–1)
Eosinophils Absolute: 0 10*3/uL (ref 0.0–0.7)
Eosinophils Relative: 0 % (ref 0–5)
HEMATOCRIT: 32.4 % — AB (ref 36.0–46.0)
Hemoglobin: 10.3 g/dL — ABNORMAL LOW (ref 12.0–15.0)
Lymphocytes Relative: 6 % — ABNORMAL LOW (ref 12–46)
Lymphs Abs: 1 10*3/uL (ref 0.7–4.0)
MCH: 27 pg (ref 26.0–34.0)
MCHC: 31.8 g/dL (ref 30.0–36.0)
MCV: 85 fL (ref 78.0–100.0)
MONO ABS: 1.3 10*3/uL — AB (ref 0.1–1.0)
MONOS PCT: 9 % (ref 3–12)
NEUTROS ABS: 12.6 10*3/uL — AB (ref 1.7–7.7)
Neutrophils Relative %: 85 % — ABNORMAL HIGH (ref 43–77)
Platelets: 225 10*3/uL (ref 150–400)
RBC: 3.81 MIL/uL — AB (ref 3.87–5.11)
RDW: 16.1 % — ABNORMAL HIGH (ref 11.5–15.5)
WBC: 14.9 10*3/uL — AB (ref 4.0–10.5)

## 2014-04-25 LAB — COMPREHENSIVE METABOLIC PANEL
ALT: 21 U/L (ref 0–35)
AST: 32 U/L (ref 0–37)
Albumin: 3.4 g/dL — ABNORMAL LOW (ref 3.5–5.2)
Alkaline Phosphatase: 87 U/L (ref 39–117)
Anion gap: 9 (ref 5–15)
BILIRUBIN TOTAL: 1 mg/dL (ref 0.3–1.2)
BUN: 27 mg/dL — AB (ref 6–23)
CO2: 24 mmol/L (ref 19–32)
CREATININE: 1.78 mg/dL — AB (ref 0.50–1.10)
Calcium: 9.6 mg/dL (ref 8.4–10.5)
Chloride: 101 mEq/L (ref 96–112)
GFR calc Af Amer: 33 mL/min — ABNORMAL LOW (ref >90)
GFR calc non Af Amer: 28 mL/min — ABNORMAL LOW (ref >90)
Glucose, Bld: 148 mg/dL — ABNORMAL HIGH (ref 70–99)
Potassium: 4.1 mmol/L (ref 3.5–5.1)
SODIUM: 134 mmol/L — AB (ref 135–145)
Total Protein: 8 g/dL (ref 6.0–8.3)

## 2014-04-25 LAB — I-STAT CG4 LACTIC ACID, ED: Lactic Acid, Venous: 1.26 mmol/L (ref 0.5–2.2)

## 2014-04-25 MED ORDER — ONDANSETRON HCL 4 MG/2ML IJ SOLN
4.0000 mg | Freq: Once | INTRAMUSCULAR | Status: AC
  Administered 2014-04-25: 4 mg via INTRAVENOUS
  Filled 2014-04-25: qty 2

## 2014-04-25 MED ORDER — CEPHALEXIN 500 MG PO CAPS: 500.0000 mg | ORAL_CAPSULE | Freq: Three times a day (TID) | ORAL | Status: DC

## 2014-04-25 MED ORDER — LABETALOL HCL 200 MG PO TABS
200.0000 mg | ORAL_TABLET | Freq: Once | ORAL | Status: AC
  Administered 2014-04-25: 200 mg via ORAL
  Filled 2014-04-25: qty 1

## 2014-04-25 MED ORDER — ONDANSETRON HCL 4 MG PO TABS: 4.0000 mg | ORAL_TABLET | Freq: Four times a day (QID) | ORAL | Status: DC

## 2014-04-25 MED ORDER — SODIUM CHLORIDE 0.9 % IV BOLUS (SEPSIS)
1000.0000 mL | Freq: Once | INTRAVENOUS | Status: AC
  Administered 2014-04-25: 1000 mL via INTRAVENOUS

## 2014-04-25 MED ORDER — DEXTROSE 5 % IV SOLN
1.0000 g | Freq: Once | INTRAVENOUS | Status: AC
  Administered 2014-04-25: 1 g via INTRAVENOUS
  Filled 2014-04-25: qty 10

## 2014-04-25 MED ORDER — AMLODIPINE BESYLATE 5 MG PO TABS
5.0000 mg | ORAL_TABLET | Freq: Once | ORAL | Status: AC
  Administered 2014-04-25: 5 mg via ORAL
  Filled 2014-04-25: qty 1

## 2014-04-25 MED ORDER — LOSARTAN POTASSIUM 50 MG PO TABS
100.0000 mg | ORAL_TABLET | Freq: Once | ORAL | Status: AC
  Administered 2014-04-25: 100 mg via ORAL
  Filled 2014-04-25: qty 2

## 2014-04-25 NOTE — ED Provider Notes (Signed)
CSN: [id number]     Arrival date & time 04/25/14  1857 History   First MD Initiated Contact with Patient 04/25/14 1917     Chief Complaint  Patient presents with  . Cough     (Consider location/radiation/quality/duration/timing/severity/associated sxs/prior Treatment) The history is provided by the patient.  [name] is a 69 y.o. female hx of HTN, diverticulosis, here with cough, fever. Cough and fever for the last 3 days. She had nonproductive cough and general malaise. Also decreased urination and dysuria. Denies any abdominal pain or vomiting. She feels nauseated so didn't take her BP meds today but denies any chest pain or shortness of breath.    Past Medical History  Diagnosis Date  . Hypertension   . Venous insufficiency   . Obesity   . Diverticulosis of colon   . Adenomatous colon polyp 01/2002  . Hemorrhoids   . Renal cyst   . DJD (degenerative joint disease)   . Lumbar back pain   . Vitamin D deficiency   . Neuropathy   . Anemia   . Hemorrhoids    Past Surgical History  Procedure Laterality Date  . [place] and right oopherectomy  1998    Dr. Nori Riis for fibroids and cyst   Family History  Problem Relation Age of Onset  . Diabetes Mother   . Colon polyps Mother   . Colon cancer Neg Hx    History  Substance Use Topics  . Smoking status: Former Smoker -- 0.40 packs/day for 15 years    Types: Cigarettes    Quit date: 04/08/1982  . Smokeless tobacco: Never Used  . Alcohol Use: No   OB History    No data available     Review of Systems  Constitutional: Positive for fever.  Respiratory: Positive for cough.   All other systems reviewed and are negative.     Allergies  Minoxidil and Naproxen  Home Medications   Prior to Admission medications   Medication Sig Start Date End Date Taking? Authorizing Provider  amLODipine (NORVASC) 5 MG tablet Take 1 tablet (5 mg total) by mouth daily. 02/04/12  Yes Noralee Space, MD  aspirin 325 MG tablet Take 650 mg by  mouth daily as needed for fever (fever).   Yes Historical Provider, MD  aspirin 81 MG tablet Take 81 mg by mouth daily as needed for fever (fever).    Yes Historical Provider, MD  calcium carbonate (OS-CAL - DOSED IN MG OF ELEMENTAL CALCIUM) 1250 MG tablet Take 2 tablets by mouth daily.   Yes Historical Provider, MD  clobetasol ointment (TEMOVATE) [id number] % Apply 1 application topically daily as needed (itching). Use as directed 01/01/12  Yes Historical Provider, MD  dextromethorphan (DELSYM) 30 MG/5ML liquid Take 30 mg by mouth daily as needed for cough (cough).   Yes Historical Provider, MD  labetalol (NORMODYNE) 200 MG tablet TAKE 2 TABLETS BY MOUTH TWICE DAILY   Yes Noralee Space, MD  losartan (COZAAR) 100 MG tablet TAKE 1 TABLET BY MOUTH EVERY DAY 02/27/12  Yes Noralee Space, MD  Multiple Vitamins-Minerals (MULTIVITAMIN PO) Take 1 tablet by mouth daily.     Yes Historical Provider, MD  Phenylephrine-Pheniramine-DM [facility] COLD & COUGH PO) Take 30 mLs by mouth daily as needed (cold symptoms).   Yes Historical Provider, MD  potassium chloride SA (K-DUR,KLOR-CON) 20 MEQ tablet TAKE 1 TABLET BY MOUTH DAILY 01/18/13  Yes Noralee Space, MD  spironolactone (ALDACTONE) 25 MG tablet Take 1 tablet (25  mg total) by mouth daily. 03/23/12  Yes Noralee Space, MD  torsemide (DEMADEX) 20 MG tablet TAKE 2 TABLETS BY MOUTH EVERY MORNING 01/18/13  Yes Noralee Space, MD  hydrocortisone (ANUSOL-HC) 2.5 % rectal cream Place 1 application rectally as needed. 05/24/13   Noralee Space, MD  meloxicam (MOBIC) 7.5 MG tablet Take 1 tablet (7.5 mg total) by mouth daily. As needed for arthritis pain 01/13/12   Noralee Space, MD  traMADol (ULTRAM) 50 MG tablet Take 1 tablet (50 mg total) by mouth 3 (three) times daily as needed for pain. 12/04/12   Noralee Space, MD  Vitamin D, Ergocalciferol, (DRISDOL) 50000 UNITS CAPS TAKE 1 CAPSULE BY MOUTH EVERY 7 DAYS Patient not taking: Reported on 04/25/2014 04/16/12   Noralee Space, MD   BP  153/80 mmHg  Pulse 95  Temp(Src) 99.2 F (37.3 C) (Oral)  Resp 16  SpO2 94% Physical Exam  Constitutional: She is oriented to person, place, and time.  Chronically ill, NAD   HENT:  Head: Normocephalic.  Mouth/Throat: Oropharynx is clear and moist.  Eyes: Conjunctivae and EOM are normal. Pupils are equal, round, and reactive to light.  Neck: Normal range of motion. Neck supple.  Cardiovascular: Regular rhythm and normal heart sounds.   Tachycardic   Pulmonary/Chest: Effort normal and breath sounds normal. No respiratory distress. She has no wheezes. She has no rales.  Abdominal: Soft. Bowel sounds are normal. She exhibits no distension. There is no tenderness. There is no rebound and no guarding.  Musculoskeletal: Normal range of motion. She exhibits no edema or tenderness.  Neurological: She is alert and oriented to person, place, and time. No cranial nerve deficit. Coordination normal.  Skin: Skin is warm and dry.  Psychiatric: She has a normal mood and affect. Her behavior is normal. Judgment and thought content normal.  Nursing note and vitals reviewed.   ED Course  Procedures (including critical care time) Labs Review Labs Reviewed  CBC WITH DIFFERENTIAL - Abnormal; Notable for the following:    WBC 14.9 (*)    RBC 3.81 (*)    Hemoglobin 10.3 (*)    HCT 32.4 (*)    RDW 16.1 (*)    Neutrophils Relative % 85 (*)    Neutro Abs 12.6 (*)    Lymphocytes Relative 6 (*)    Monocytes Absolute 1.3 (*)    All other components within normal limits  COMPREHENSIVE METABOLIC PANEL - Abnormal; Notable for the following:    Sodium 134 (*)    Glucose, Bld 148 (*)    BUN 27 (*)    Creatinine, Ser 1.78 (*)    Albumin 3.4 (*)    GFR calc non Af Amer 28 (*)    GFR calc Af Amer 33 (*)    All other components within normal limits  URINALYSIS, ROUTINE W REFLEX MICROSCOPIC - Abnormal; Notable for the following:    APPearance CLOUDY (*)    Hgb urine dipstick MODERATE (*)    Protein, ur  >300 (*)    Leukocytes, UA SMALL (*)    All other components within normal limits  URINE MICROSCOPIC-ADD ON - Abnormal; Notable for the following:    Squamous Epithelial / LPF FEW (*)    Bacteria, UA FEW (*)    All other components within normal limits  URINE CULTURE  CULTURE, BLOOD (ROUTINE X 2)  CULTURE, BLOOD (ROUTINE X 2)  I-STAT CG4 LACTIC ACID, ED    Imaging Review Dg Chest  2 View  04/25/2014   CLINICAL DATA:  Initial encounter for Cough. Congestion. Shortness of breath. Fever and weakness for 3 days.  EXAM: CHEST  2 VIEW  COMPARISON:  03/20/2011  FINDINGS: Midline trachea. Right paratracheal soft tissue fullness is similar to on the prior exam. Moderate cardiomegaly. Tortuous thoracic aorta with atherosclerosis in the transverse segment. No pleural effusion or pneumothorax. No congestive failure. Clear lungs.  IMPRESSION: Cardiomegaly without congestive failure.  Right paratracheal soft tissue fullness, present back to 2008. This is consistent with a benign etiology. Considerations include right-sided thyroid enlargement, prominent brachiocephalic vein, or less likely a benign lymph node.   Electronically Signed   By: Abigail Miyamoto M.D.   On: 04/25/2014 19:48     EKG Interpretation None      MDM   Final diagnoses:  Cough   [name] is a 69 y.o. female here with fever, cough, dysuria. Tachycardic, hypertensive. Will do sepsis workup. Will give PO BP meds. I doubt hypertensive emergency.   10:13 PM BP improved. Fever resolved, tachycardic improved. UA + UTI. WBC 15 likely from UTI. CXR showed no obvious pneumonia. May have flu as well but outside window for tamiflu. Will d/c on keflex.     Wandra Arthurs, MD 04/25/14 2216

## 2014-04-25 NOTE — ED Notes (Signed)
Patient states that she started feeling bad Saturday night. Has had a non productive cough and general malaise with fever. Also notes dysuria. Alert and oriented.

## 2014-04-25 NOTE — Discharge Instructions (Signed)
Take keflex as prescribed for a week.   Take zofran for nausea.   Stay hydrated.   Take tylenol for fever.   Follow up with your doctor.   Return to ER if you have fever, vomiting, worse weakness.

## 2014-04-26 ENCOUNTER — Telehealth (HOSPITAL_BASED_OUTPATIENT_CLINIC_OR_DEPARTMENT_OTHER): Payer: Self-pay | Admitting: Emergency Medicine

## 2014-04-26 ENCOUNTER — Telehealth (HOSPITAL_COMMUNITY): Payer: Self-pay

## 2014-04-26 ENCOUNTER — Inpatient Hospital Stay (HOSPITAL_COMMUNITY)
Admission: EM | Admit: 2014-04-26 | Discharge: 2014-05-01 | DRG: 868 | Disposition: A | Discharge status: Home / Self Care | Payer: Medicare Other | Attending: Family Medicine | Admitting: Family Medicine

## 2014-04-26 ENCOUNTER — Encounter (HOSPITAL_COMMUNITY): Payer: Self-pay | Admitting: Emergency Medicine

## 2014-04-26 DIAGNOSIS — B9620 Unspecified Escherichia coli [E. coli] as the cause of diseases classified elsewhere: Principal | ICD-10-CM | POA: Diagnosis present

## 2014-04-26 DIAGNOSIS — D649 Anemia, unspecified: Secondary | ICD-10-CM | POA: Diagnosis not present

## 2014-04-26 DIAGNOSIS — N183 Chronic kidney disease, stage 3 (moderate): Secondary | ICD-10-CM | POA: Diagnosis not present

## 2014-04-26 DIAGNOSIS — N390 Urinary tract infection, site not specified: Secondary | ICD-10-CM | POA: Diagnosis present

## 2014-04-26 DIAGNOSIS — M545 Low back pain: Secondary | ICD-10-CM | POA: Diagnosis not present

## 2014-04-26 DIAGNOSIS — I10 Essential (primary) hypertension: Secondary | ICD-10-CM | POA: Diagnosis not present

## 2014-04-26 DIAGNOSIS — M542 Cervicalgia: Secondary | ICD-10-CM | POA: Diagnosis present

## 2014-04-26 DIAGNOSIS — I129 Hypertensive chronic kidney disease with stage 1 through stage 4 chronic kidney disease, or unspecified chronic kidney disease: Secondary | ICD-10-CM | POA: Diagnosis present

## 2014-04-26 DIAGNOSIS — Z79891 Long term (current) use of opiate analgesic: Secondary | ICD-10-CM

## 2014-04-26 DIAGNOSIS — R7881 Bacteremia: Secondary | ICD-10-CM | POA: Diagnosis not present

## 2014-04-26 DIAGNOSIS — A4150 Gram-negative sepsis, unspecified: Secondary | ICD-10-CM | POA: Diagnosis not present

## 2014-04-26 DIAGNOSIS — Z7982 Long term (current) use of aspirin: Secondary | ICD-10-CM | POA: Diagnosis not present

## 2014-04-26 DIAGNOSIS — Z86010 Personal history of colonic polyps: Secondary | ICD-10-CM

## 2014-04-26 DIAGNOSIS — N184 Chronic kidney disease, stage 4 (severe): Secondary | ICD-10-CM | POA: Diagnosis present

## 2014-04-26 DIAGNOSIS — Z833 Family history of diabetes mellitus: Secondary | ICD-10-CM | POA: Diagnosis not present

## 2014-04-26 DIAGNOSIS — Z888 Allergy status to other drugs, medicaments and biological substances status: Secondary | ICD-10-CM

## 2014-04-26 DIAGNOSIS — E559 Vitamin D deficiency, unspecified: Secondary | ICD-10-CM | POA: Diagnosis not present

## 2014-04-26 DIAGNOSIS — I872 Venous insufficiency (chronic) (peripheral): Secondary | ICD-10-CM | POA: Diagnosis present

## 2014-04-26 DIAGNOSIS — Z79899 Other long term (current) drug therapy: Secondary | ICD-10-CM

## 2014-04-26 DIAGNOSIS — I5032 Chronic diastolic (congestive) heart failure: Secondary | ICD-10-CM | POA: Diagnosis not present

## 2014-04-26 DIAGNOSIS — Z8371 Family history of colonic polyps: Secondary | ICD-10-CM | POA: Diagnosis not present

## 2014-04-26 DIAGNOSIS — R300 Dysuria: Secondary | ICD-10-CM | POA: Diagnosis not present

## 2014-04-26 DIAGNOSIS — E669 Obesity, unspecified: Secondary | ICD-10-CM | POA: Diagnosis present

## 2014-04-26 DIAGNOSIS — Z6838 Body mass index (BMI) 38.0-38.9, adult: Secondary | ICD-10-CM | POA: Diagnosis not present

## 2014-04-26 DIAGNOSIS — I5030 Unspecified diastolic (congestive) heart failure: Secondary | ICD-10-CM | POA: Diagnosis present

## 2014-04-26 DIAGNOSIS — M1990 Unspecified osteoarthritis, unspecified site: Secondary | ICD-10-CM | POA: Diagnosis present

## 2014-04-26 DIAGNOSIS — Z87891 Personal history of nicotine dependence: Secondary | ICD-10-CM

## 2014-04-26 DIAGNOSIS — N179 Acute kidney failure, unspecified: Secondary | ICD-10-CM | POA: Diagnosis present

## 2014-04-26 DIAGNOSIS — B9689 Other specified bacterial agents as the cause of diseases classified elsewhere: Secondary | ICD-10-CM | POA: Diagnosis not present

## 2014-04-26 LAB — CBC WITH DIFFERENTIAL/PLATELET
BASOS PCT: 0 % (ref 0–1)
Basophils Absolute: 0 10*3/uL (ref 0.0–0.1)
EOS PCT: 0 % (ref 0–5)
Eosinophils Absolute: 0 10*3/uL (ref 0.0–0.7)
HCT: 30.5 % — ABNORMAL LOW (ref 36.0–46.0)
Hemoglobin: 9.5 g/dL — ABNORMAL LOW (ref 12.0–15.0)
LYMPHS ABS: 1.3 10*3/uL (ref 0.7–4.0)
Lymphocytes Relative: 13 % (ref 12–46)
MCH: 26.8 pg (ref 26.0–34.0)
MCHC: 31.1 g/dL (ref 30.0–36.0)
MCV: 85.9 fL (ref 78.0–100.0)
MONO ABS: 1.3 10*3/uL — AB (ref 0.1–1.0)
Monocytes Relative: 13 % — ABNORMAL HIGH (ref 3–12)
Neutro Abs: 7.5 10*3/uL (ref 1.7–7.7)
Neutrophils Relative %: 74 % (ref 43–77)
PLATELETS: 187 10*3/uL (ref 150–400)
RBC: 3.55 MIL/uL — AB (ref 3.87–5.11)
RDW: 16.2 % — ABNORMAL HIGH (ref 11.5–15.5)
WBC: 10.2 10*3/uL (ref 4.0–10.5)

## 2014-04-26 LAB — I-STAT CHEM 8, ED
BUN: 27 mg/dL — AB (ref 6–23)
CALCIUM ION: 1.33 mmol/L — AB (ref 1.13–1.30)
Chloride: 102 mEq/L (ref 96–112)
Creatinine, Ser: 1.9 mg/dL — ABNORMAL HIGH (ref 0.50–1.10)
Glucose, Bld: 101 mg/dL — ABNORMAL HIGH (ref 70–99)
HCT: 31 % — ABNORMAL LOW (ref 36.0–46.0)
Hemoglobin: 10.5 g/dL — ABNORMAL LOW (ref 12.0–15.0)
POTASSIUM: 3.7 mmol/L (ref 3.5–5.1)
Sodium: 138 mmol/L (ref 135–145)
TCO2: 22 mmol/L (ref 0–100)

## 2014-04-26 MED ORDER — HEPARIN SODIUM (PORCINE) 5000 UNIT/ML IJ SOLN
5000.0000 [IU] | Freq: Three times a day (TID) | INTRAMUSCULAR | Status: DC
  Administered 2014-04-26 – 2014-05-01 (×14): 5000 [IU] via SUBCUTANEOUS
  Filled 2014-04-26 (×17): qty 1

## 2014-04-26 MED ORDER — LOSARTAN POTASSIUM 50 MG PO TABS
100.0000 mg | ORAL_TABLET | Freq: Every day | ORAL | Status: DC
  Administered 2014-04-27 – 2014-05-01 (×5): 100 mg via ORAL
  Filled 2014-04-26 (×5): qty 2

## 2014-04-26 MED ORDER — ADULT MULTIVITAMIN LIQUID CH
5.0000 mL | Freq: Every day | ORAL | Status: DC
  Administered 2014-04-27 – 2014-04-30 (×4): 5 mL via ORAL
  Filled 2014-04-26 (×5): qty 5

## 2014-04-26 MED ORDER — ONDANSETRON HCL 4 MG/2ML IJ SOLN: 4.0000 mg | Freq: Four times a day (QID) | INTRAMUSCULAR | Status: DC | PRN

## 2014-04-26 MED ORDER — CEFTRIAXONE SODIUM 1 G IJ SOLR
1.0000 g | INTRAMUSCULAR | Status: DC
Start: 2014-04-26 — End: 2014-04-28
  Administered 2014-04-26 – 2014-04-27 (×2): 1 g via INTRAVENOUS
  Filled 2014-04-26 (×3): qty 10

## 2014-04-26 MED ORDER — LABETALOL HCL 200 MG PO TABS
400.0000 mg | ORAL_TABLET | Freq: Two times a day (BID) | ORAL | Status: DC
  Administered 2014-04-26 – 2014-05-01 (×10): 400 mg via ORAL
  Filled 2014-04-26 (×11): qty 2

## 2014-04-26 MED ORDER — ACETAMINOPHEN 500 MG PO TABS
500.0000 mg | ORAL_TABLET | Freq: Four times a day (QID) | ORAL | Status: DC | PRN
  Administered 2014-04-26: 500 mg via ORAL
  Filled 2014-04-26: qty 1

## 2014-04-26 MED ORDER — ACETAMINOPHEN 500 MG PO TABS
500.0000 mg | ORAL_TABLET | Freq: Once | ORAL | Status: AC
  Administered 2014-04-26: 500 mg via ORAL
  Filled 2014-04-26: qty 1

## 2014-04-26 MED ORDER — MULTIVITAMIN PO LIQD: 1.0000 | Freq: Every day | ORAL | Status: DC

## 2014-04-26 MED ORDER — HYDROCORTISONE 2.5 % RE CREA
1.0000 "application " | TOPICAL_CREAM | RECTAL | Status: DC | PRN
  Filled 2014-04-26: qty 28.35

## 2014-04-26 MED ORDER — SPIRONOLACTONE 25 MG PO TABS
25.0000 mg | ORAL_TABLET | Freq: Every day | ORAL | Status: DC
  Administered 2014-04-27 – 2014-05-01 (×5): 25 mg via ORAL
  Filled 2014-04-26 (×5): qty 1

## 2014-04-26 MED ORDER — TRAMADOL HCL 50 MG PO TABS: 50.0000 mg | ORAL_TABLET | Freq: Three times a day (TID) | ORAL | Status: DC | PRN

## 2014-04-26 MED ORDER — DEXTROMETHORPHAN POLISTIREX 30 MG/5ML PO LQCR
30.0000 mg | Freq: Every day | ORAL | Status: DC | PRN
  Filled 2014-04-26: qty 5

## 2014-04-26 MED ORDER — ASPIRIN 81 MG PO TABS
81.0000 mg | ORAL_TABLET | Freq: Every day | ORAL | Status: DC | PRN
  Filled 2014-04-26: qty 1

## 2014-04-26 MED ORDER — HYDRALAZINE HCL 20 MG/ML IJ SOLN: 5.0000 mg | INTRAMUSCULAR | Status: DC | PRN

## 2014-04-26 MED ORDER — AMLODIPINE BESYLATE 5 MG PO TABS
5.0000 mg | ORAL_TABLET | Freq: Every day | ORAL | Status: DC
  Administered 2014-04-27: 5 mg via ORAL
  Filled 2014-04-26: qty 1

## 2014-04-26 MED ORDER — CLOBETASOL PROPIONATE 0.05 % EX OINT
1.0000 "application " | TOPICAL_OINTMENT | Freq: Every day | CUTANEOUS | Status: DC | PRN
  Filled 2014-04-26: qty 15

## 2014-04-26 MED ORDER — ONDANSETRON HCL 4 MG PO TABS
4.0000 mg | ORAL_TABLET | Freq: Four times a day (QID) | ORAL | Status: DC | PRN
  Administered 2014-04-29: 4 mg via ORAL
  Filled 2014-04-26: qty 1

## 2014-04-26 MED ORDER — CALCITRIOL 0.5 MCG PO CAPS
0.5000 ug | ORAL_CAPSULE | Freq: Every day | ORAL | Status: DC
Start: 2014-04-27 — End: 2014-05-01
  Administered 2014-04-27 – 2014-05-01 (×5): 0.5 ug via ORAL
  Filled 2014-04-26 (×5): qty 1

## 2014-04-26 MED ORDER — CALCIUM CARBONATE 1250 (500 CA) MG PO TABS
2.0000 | ORAL_TABLET | Freq: Every day | ORAL | Status: DC
  Administered 2014-04-27 – 2014-05-01 (×5): 1000 mg via ORAL
  Filled 2014-04-26 (×5): qty 2

## 2014-04-26 NOTE — Progress Notes (Signed)
  CARE MANAGEMENT ED NOTE 04/26/2014  Patient:  Colwell,Antionetta   Account Number:  [id number]  Date Initiated:  04/26/2014  Documentation initiated by:  Livia Snellen  Subjective/Objective Assessment:   Patient presents to Ed with burning on urination, fever and chills.     Subjective/Objective Assessment Detail:   Patient shown as repeat ED visitor.  Patient in ED last night for the same.  Patient discharged on po keflex, received IV antibiotics in ED    Patient with temp od 101.1 in ED. Previous blood cultures drawn and positive for gram negative rods.     Action/Plan:   Action/Plan Detail:   Anticipated DC Date:       Status Recommendation to Physician:   Result of Recommendation:    Other ED Services  Consult Working [place]  Other    Choice offered to / List presented to:            Status of service:  Completed, signed off  ED Comments:   ED Comments Detail:  Patient listed as having [employer] Medicare insurnace with pcp Dr. Nanci Pina, living in [place].

## 2014-04-26 NOTE — ED Notes (Signed)
Pt states she was seen here last night and was diagnosed with a UTI  Pt states she was called today at home and told to come back for further treatment  One of her cultures came back positive

## 2014-04-26 NOTE — H&P (Signed)
[facility] History and Physical  [name] [id number] DOB: [birth year] DOA: 04/26/2014  Referring physician: ED physician PCP: Noralee Space, MD  Specialists:   Chief Complaint: Burning on urination  HPI: [name] is a 69 y.o. female with past medical history of hypertension, anemia, chronic kidney disease-stage III, diastolic congestive heart failure, who presents with burning on urination.  Patient reports that she has a burning sensation on urination during last week, but denies dysuria or increased urinary frequency. She also has fever and chills. She has a pressure like feeling in the suprapubic area. She does not have nausea, vomiting, diarrhea, abdominal pain. Was evaluated in the emergency room last night, and diagnosed with UTI. She was discharged home on oral Keflex after receiving 1 dose of IV antibiotics. Blood culture was collected which comes back positive for gram-negative rod today. She was otherwise to come to the emergency room for further evaluation and treatment. She has chronic leg edema and back pain. Patient denies chest pain, SOB, abdominal pain, diarrhea, constipation, skin rashes.   Work up in the ED demonstrates T=101.1. Positive urinalysis with gram-negative rod bacteremia. Lactate 1.26. Slightly worsening renal function. Patient is admitted to inpatient for further evaluation and treatment.  Review of Systems: As presented in the history of presenting illness, rest negative.  Where does patient live?  At home Can patient participate in ADLs? little  Allergy:  Allergies  Allergen Reactions  . Minoxidil     Causes palpitations/unable to sleep  . Naproxen     REACTION: swelling    Past Medical History  Diagnosis Date  . Hypertension   . Venous insufficiency   . Obesity   . Diverticulosis of colon   . Adenomatous colon polyp 01/2002  . Hemorrhoids   . Renal cyst   . DJD (degenerative joint disease)   . Lumbar back pain   . Vitamin D  deficiency   . Neuropathy   . Anemia   . Hemorrhoids     Past Surgical History  Procedure Laterality Date  . [place] and right oopherectomy  1998    Dr. Nori Riis for fibroids and cyst    Social History:  reports that she quit smoking about 32 years ago. Her smoking use included Cigarettes. She has a 6 pack-year smoking history. She has never used smokeless tobacco. She reports that she does not drink alcohol or use illicit drugs.  Family History:  Family History  Problem Relation Age of Onset  . Diabetes Mother   . Colon polyps Mother   . Colon cancer Neg Hx      Prior to Admission medications   Medication Sig Start Date End Date Taking? Authorizing Provider  amLODipine (NORVASC) 5 MG tablet Take 1 tablet (5 mg total) by mouth daily. 02/04/12  Yes Noralee Space, MD  aspirin 325 MG tablet Take 650 mg by mouth daily as needed for fever (fever).   Yes Historical Provider, MD  CALCITRIOL PO Take 1 capsule by mouth daily.   Yes Historical Provider, MD  clobetasol ointment (TEMOVATE) [id number] % Apply 1 application topically daily as needed (itching). Use as directed 01/01/12  Yes Historical Provider, MD  dextromethorphan (DELSYM) 30 MG/5ML liquid Take 30 mg by mouth daily as needed for cough (cough).   Yes Historical Provider, MD  hydrocortisone (ANUSOL-HC) 2.5 % rectal cream Place 1 application rectally as needed. 05/24/13  Yes Noralee Space, MD  labetalol (NORMODYNE) 200 MG tablet TAKE 2 TABLETS BY MOUTH TWICE DAILY  Yes Noralee Space, MD  losartan (COZAAR) 100 MG tablet TAKE 1 TABLET BY MOUTH EVERY DAY 02/27/12  Yes Noralee Space, MD  Multiple Vitamins-Minerals (MULTIVITAMIN PO) Take 1 tablet by mouth daily.     Yes Historical Provider, MD  potassium chloride SA (K-DUR,KLOR-CON) 20 MEQ tablet TAKE 1 TABLET BY MOUTH DAILY 01/18/13  Yes Noralee Space, MD  spironolactone (ALDACTONE) 25 MG tablet Take 1 tablet (25 mg total) by mouth daily. 03/23/12  Yes Noralee Space, MD  torsemide (DEMADEX) 20 MG  tablet TAKE 2 TABLETS BY MOUTH EVERY MORNING 01/18/13  Yes Noralee Space, MD  traMADol (ULTRAM) 50 MG tablet Take 1 tablet (50 mg total) by mouth 3 (three) times daily as needed for pain. 12/04/12  Yes Noralee Space, MD  aspirin 81 MG tablet Take 81 mg by mouth daily as needed for fever.     Historical Provider, MD  calcium carbonate (OS-CAL - DOSED IN MG OF ELEMENTAL CALCIUM) 1250 MG tablet Take 2 tablets by mouth daily.    Historical Provider, MD  cephALEXin (KEFLEX) 500 MG capsule Take 1 capsule (500 mg total) by mouth 3 (three) times daily. Patient not taking: Reported on 04/26/2014 04/25/14   Wandra Arthurs, MD  meloxicam (MOBIC) 7.5 MG tablet Take 1 tablet (7.5 mg total) by mouth daily. As needed for arthritis pain Patient not taking: Reported on 04/26/2014 01/13/12   Noralee Space, MD  ondansetron (ZOFRAN) 4 MG tablet Take 1 tablet (4 mg total) by mouth every 6 (six) hours. Patient not taking: Reported on 04/26/2014 04/25/14   Wandra Arthurs, MD  Phenylephrine-Pheniramine-DM [facility] COLD & COUGH PO) Take 30 mLs by mouth daily as needed (cold symptoms).    Historical Provider, MD  Vitamin D, Ergocalciferol, (DRISDOL) 50000 UNITS CAPS TAKE 1 CAPSULE BY MOUTH EVERY 7 DAYS Patient not taking: Reported on 04/25/2014 04/16/12   Noralee Space, MD    Physical Exam: Filed Vitals:   04/26/14 2030 04/26/14 2100 04/26/14 2130 04/26/14 2140  BP: 165/79 182/80 171/85 171/85  Pulse: 92 88 89 86  Temp:      TempSrc:      Resp:    18  SpO2: 95% 94% 98% 93%   General: Not in acute distress HEENT:       Eyes: PERRL, EOMI, no scleral icterus       ENT: No discharge from the ears and nose, no pharynx injection, no tonsillar enlargement.        Neck: No JVD, no bruit, no mass felt. Cardiac: S1/S2, RRR, No murmurs, No gallops or rubs Pulm: Good air movement bilaterally. Clear to auscultation bilaterally. No rales, wheezing, rhonchi or rubs. Abd: Soft, nondistended, nontender, no rebound pain, no organomegaly, BS  present Ext: trace leg edema bilaterally. 2+DP/PT pulse bilaterally Musculoskeletal: No joint deformities, erythema, or stiffness, ROM full Skin: No rashes.  Neuro: Alert and oriented X3, cranial nerves II-XII grossly intact, muscle strength 5/5 in all extremeties, sensation to light touch intact.  Psych: Patient is not psychotic, no suicidal or hemocidal ideation.  Labs on Admission:  Basic Metabolic Panel:  Recent Labs Lab 04/25/14 1935 04/26/14 2049  NA 134* 138  K 4.1 3.7  CL 101 102  CO2 24  --   GLUCOSE 148* 101*  BUN 27* 27*  CREATININE 1.78* 1.90*  CALCIUM 9.6  --    Liver Function Tests:  Recent Labs Lab 04/25/14 1935  AST 32  ALT 21  ALKPHOS  87  BILITOT 1.0  PROT 8.0  ALBUMIN 3.4*   No results for input(s): LIPASE, AMYLASE in the last 168 hours. No results for input(s): AMMONIA in the last 168 hours. CBC:  Recent Labs Lab 04/25/14 1935 04/26/14 2041 04/26/14 2049  WBC 14.9* 10.2  --   NEUTROABS 12.6* 7.5  --   HGB 10.3* 9.5* 10.5*  HCT 32.4* 30.5* 31.0*  MCV 85.0 85.9  --   PLT 225 187  --    Cardiac Enzymes: No results for input(s): CKTOTAL, CKMB, CKMBINDEX, TROPONINI in the last 168 hours.  BNP (last 3 results) No results for input(s): PROBNP in the last 8760 hours. CBG: No results for input(s): GLUCAP in the last 168 hours.  Radiological Exams on Admission: Dg Chest 2 View  04/25/2014   CLINICAL DATA:  Initial encounter for Cough. Congestion. Shortness of breath. Fever and weakness for 3 days.  EXAM: CHEST  2 VIEW  COMPARISON:  03/20/2011  FINDINGS: Midline trachea. Right paratracheal soft tissue fullness is similar to on the prior exam. Moderate cardiomegaly. Tortuous thoracic aorta with atherosclerosis in the transverse segment. No pleural effusion or pneumothorax. No congestive failure. Clear lungs.  IMPRESSION: Cardiomegaly without congestive failure.  Right paratracheal soft tissue fullness, present back to 2008. This is consistent with a  benign etiology. Considerations include right-sided thyroid enlargement, prominent brachiocephalic vein, or less likely a benign lymph node.   Electronically Signed   By: Abigail Miyamoto M.D.   On: 04/25/2014 19:48    Assessment/Plan Principal Problem:   Bacteremia due to Gram-negative bacteria Active Problems:   Anemia   Essential hypertension   Venous (peripheral) insufficiency   BACK PAIN, LUMBAR   Bacteremia   CKD (chronic kidney disease) stage 3, GFR 30-59 ml/min   UTI (lower urinary tract infection)   Diastolic congestive heart failure  Bacteremia due to gram-negative rods and UTI: Currently patient is not obviously septic. Blood pressure is elevated on admission. Lactic 1.26. -will admit to med-surg bed -IV rocephin -follow up blood and urine culture  Diastolic congestive heart failure: 2-D echo on 03/11/12 showed EF 60-70% with grade 1 diastolic dysfunction. Patient also has chronic venous insufficiency with trace leg edema. Currently patient is taking spironolactone, Demadex at home. Volume status is fine with a trace amount of leg edema. -We'll hold Demadex tonight given slightly worsened renal Fx -follow up BNP in aM -continue aspirin, labetalol, losartan  CDK-III: Baseline creatinine is1.9-2.1. Her creatinine was 1.78 yesterday. Today her creatinine is 1.9 which is slightly worsening than yesterday. -We'll hold Demadex tonight, may resume in the morning -Follow-up renal function and BMP. -d/c mobic -start tylenol for back pain  HTN:  -Continue amlodipine, labetalol, losartan, -Add hydralazine when necessary due to holding Demadex  Anemia: Hemoglobin 11.4 on 01/10/12--> 9.5 today. Likely due to chronic kidney disease. -check anemia panel   DVT ppx: SQ Heparin      Code Status: Full code Family Communication:   Yes, patient's   husband    at bed side Disposition Plan: Admit to inpatient   Date of Service 04/26/2014    Ivor Costa [facility] Pager  [phone]  If 7PM-7AM, please contact night-coverage [email or web address] Password [facility] 04/26/2014, 9:49 PM

## 2014-04-26 NOTE — ED Notes (Signed)
[facility] lab called positive blood cultures- gram neg rods in 2 sets both bottles. Chart reviewed by Truitt Leep PA. Advised to have pt return to ed for further evaluation. Attempted to call . Will try again.

## 2014-04-26 NOTE — ED Provider Notes (Signed)
CSN: [id number]     Arrival date & time 04/26/14  1947 History   First MD Initiated Contact with Patient 04/26/14 2020     Chief Complaint  Patient presents with  . Urinary Tract Infection   [name] is a 69 y.o. female with history of hypertension, chronic kidney disease who presents the emergency department complaining of symptoms of a UTI for the past week. Patient was seen in the emergency department yesterday and discharge treatment for UTI. Patient's blood cultures returned with gram-negative rods and she was told to return to the emergency department. Patient reports she feels okay and has body aches from a fever. Patient reports dysuria, low appetite and intermittent cough. Patient has not taken any Keflex as prescribed. The patient has not taken any treatments today. Patient reports some intermittent shortness of breath but denies current shortness of breath. Patient denies any pain. Patient reports she usually takes Lasix at home but has not taken any in the past several days due to not wanting to urinate frequently. Patient reports due to not taking Lasix her edema in her legs has increased.  (Consider location/radiation/quality/duration/timing/severity/associated sxs/prior Treatment) HPI  Past Medical History  Diagnosis Date  . Hypertension   . Venous insufficiency   . Obesity   . Diverticulosis of colon   . Adenomatous colon polyp 01/2002  . Hemorrhoids   . Renal cyst   . DJD (degenerative joint disease)   . Lumbar back pain   . Vitamin D deficiency   . Neuropathy   . Anemia   . Hemorrhoids    Past Surgical History  Procedure Laterality Date  . [place] and right oopherectomy  1998    Dr. Nori Riis for fibroids and cyst   Family History  Problem Relation Age of Onset  . Diabetes Mother   . Colon polyps Mother   . Colon cancer Neg Hx    History  Substance Use Topics  . Smoking status: Former Smoker -- 0.40 packs/day for 15 years    Types: Cigarettes    Quit date:  04/08/1982  . Smokeless tobacco: Never Used  . Alcohol Use: No   OB History    No data available     Review of Systems  Constitutional: Positive for fever and chills.  HENT: Negative for congestion, sore throat and trouble swallowing.   Eyes: Negative for visual disturbance.  Respiratory: Positive for cough and shortness of breath. Negative for wheezing.   Cardiovascular: Negative for chest pain and palpitations.  Gastrointestinal: Negative for nausea, vomiting, abdominal pain and diarrhea.  Genitourinary: Positive for dysuria. Negative for hematuria, flank pain, vaginal bleeding and vaginal discharge.  Musculoskeletal: Negative for back pain and neck pain.  Skin: Negative for rash and wound.  Neurological: Negative for headaches.      Allergies  Minoxidil and Naproxen  Home Medications   Prior to Admission medications   Medication Sig Start Date End Date Taking? Authorizing Provider  amLODipine (NORVASC) 5 MG tablet Take 1 tablet (5 mg total) by mouth daily. 02/04/12  Yes Noralee Space, MD  aspirin 325 MG tablet Take 650 mg by mouth daily as needed for fever (fever).   Yes Historical Provider, MD  CALCITRIOL PO Take 1 capsule by mouth daily.   Yes Historical Provider, MD  clobetasol ointment (TEMOVATE) [id number] % Apply 1 application topically daily as needed (itching). Use as directed 01/01/12  Yes Historical Provider, MD  dextromethorphan (DELSYM) 30 MG/5ML liquid Take 30 mg by mouth daily as needed  for cough (cough).   Yes Historical Provider, MD  hydrocortisone (ANUSOL-HC) 2.5 % rectal cream Place 1 application rectally as needed. 05/24/13  Yes Noralee Space, MD  labetalol (NORMODYNE) 200 MG tablet TAKE 2 TABLETS BY MOUTH TWICE DAILY   Yes Noralee Space, MD  losartan (COZAAR) 100 MG tablet TAKE 1 TABLET BY MOUTH EVERY DAY 02/27/12  Yes Noralee Space, MD  Multiple Vitamins-Minerals (MULTIVITAMIN PO) Take 1 tablet by mouth daily.     Yes Historical Provider, MD  potassium chloride  SA (K-DUR,KLOR-CON) 20 MEQ tablet TAKE 1 TABLET BY MOUTH DAILY 01/18/13  Yes Noralee Space, MD  spironolactone (ALDACTONE) 25 MG tablet Take 1 tablet (25 mg total) by mouth daily. 03/23/12  Yes Noralee Space, MD  torsemide (DEMADEX) 20 MG tablet TAKE 2 TABLETS BY MOUTH EVERY MORNING 01/18/13  Yes Noralee Space, MD  traMADol (ULTRAM) 50 MG tablet Take 1 tablet (50 mg total) by mouth 3 (three) times daily as needed for pain. 12/04/12  Yes Noralee Space, MD  aspirin 81 MG tablet Take 81 mg by mouth daily as needed for fever.     Historical Provider, MD  calcium carbonate (OS-CAL - DOSED IN MG OF ELEMENTAL CALCIUM) 1250 MG tablet Take 2 tablets by mouth daily.    Historical Provider, MD  cephALEXin (KEFLEX) 500 MG capsule Take 1 capsule (500 mg total) by mouth 3 (three) times daily. Patient not taking: Reported on 04/26/2014 04/25/14   Wandra Arthurs, MD  meloxicam (MOBIC) 7.5 MG tablet Take 1 tablet (7.5 mg total) by mouth daily. As needed for arthritis pain Patient not taking: Reported on 04/26/2014 01/13/12   Noralee Space, MD  ondansetron (ZOFRAN) 4 MG tablet Take 1 tablet (4 mg total) by mouth every 6 (six) hours. Patient not taking: Reported on 04/26/2014 04/25/14   Wandra Arthurs, MD  Phenylephrine-Pheniramine-DM [facility] COLD & COUGH PO) Take 30 mLs by mouth daily as needed (cold symptoms).    Historical Provider, MD  Vitamin D, Ergocalciferol, (DRISDOL) 50000 UNITS CAPS TAKE 1 CAPSULE BY MOUTH EVERY 7 DAYS Patient not taking: Reported on 04/25/2014 04/16/12   Noralee Space, MD   BP 165/79 mmHg  Pulse 92  Temp(Src) 99 F (37.2 C) (Oral)  Resp 18  SpO2 95% Physical Exam  Constitutional: She appears well-developed and well-nourished. No distress.  HENT:  Head: Normocephalic and atraumatic.  Mouth/Throat: Oropharynx is clear and moist. No oropharyngeal exudate.  Eyes: Conjunctivae are normal. Pupils are equal, round, and reactive to light. Right eye exhibits no discharge. Left eye exhibits no discharge.   Neck: Neck supple.  Cardiovascular: Normal rate, regular rhythm, normal heart sounds and intact distal pulses.  Exam reveals no gallop and no friction rub.   Pulmonary/Chest: Effort normal and breath sounds normal. No respiratory distress. She has no wheezes. She has no rales.  Abdominal: Soft. Bowel sounds are normal. She exhibits no distension and no mass. There is no tenderness. There is no rebound and no guarding.  Abdomen is soft and nontender to palpation. Bowel sounds are present.  Musculoskeletal: She exhibits no edema.  Lymphadenopathy:    She has no cervical adenopathy.  Neurological: She is alert. Coordination normal.  Skin: Skin is warm and dry. No rash noted. She is not diaphoretic. No erythema. No pallor.  Psychiatric: She has a normal mood and affect. Her behavior is normal.  Nursing note and vitals reviewed.   ED Course  Procedures (including critical  care time) Labs Review Labs Reviewed  CBC WITH DIFFERENTIAL - Abnormal; Notable for the following:    RBC 3.55 (*)    Hemoglobin 9.5 (*)    HCT 30.5 (*)    RDW 16.2 (*)    Monocytes Relative 13 (*)    Monocytes Absolute 1.3 (*)    All other components within normal limits  I-STAT CHEM 8, ED - Abnormal; Notable for the following:    BUN 27 (*)    Creatinine, Ser 1.90 (*)    Glucose, Bld 101 (*)    Calcium, Ion 1.33 (*)    Hemoglobin 10.5 (*)    HCT 31.0 (*)    All other components within normal limits    Imaging Review Dg Chest 2 View  04/25/2014   CLINICAL DATA:  Initial encounter for Cough. Congestion. Shortness of breath. Fever and weakness for 3 days.  EXAM: CHEST  2 VIEW  COMPARISON:  03/20/2011  FINDINGS: Midline trachea. Right paratracheal soft tissue fullness is similar to on the prior exam. Moderate cardiomegaly. Tortuous thoracic aorta with atherosclerosis in the transverse segment. No pleural effusion or pneumothorax. No congestive failure. Clear lungs.  IMPRESSION: Cardiomegaly without congestive  failure.  Right paratracheal soft tissue fullness, present back to 2008. This is consistent with a benign etiology. Considerations include right-sided thyroid enlargement, prominent brachiocephalic vein, or less likely a benign lymph node.   Electronically Signed   By: Abigail Miyamoto M.D.   On: 04/25/2014 19:48     EKG Interpretation None      Filed Vitals:   04/26/14 2009 04/26/14 2030  BP: 161/74 165/79  Pulse: 95 92  Temp: 99 F (37.2 C)   TempSrc: Oral   Resp: 18   SpO2: 94% 95%     MDM   Meds given in ED:  Medications  cefTRIAXone (ROCEPHIN) 1 g in dextrose 5 % 50 mL IVPB (1 g Intravenous New Bag/Given 04/26/14 2105)  acetaminophen (TYLENOL) tablet 500 mg (500 mg Oral Given 04/26/14 2102)    New Prescriptions   No medications on file    Final diagnoses:  Bacteremia  UTI (lower urinary tract infection)    This is a 68 year old female with a history of hypertension and chronic kidney disease who presented to the emergency department with symptoms of UTI for the past week. Patient was seen in the emergency department yesterday and discharged with Keflex for UTI. Patient had blood cultures which returned with gram-negative rods. Patient was given Rocephin in the ED yesterday. Patient started on Rocephin in the ED again today. She has not taken any Keflex. Patient had a white count of 15 yesterday today her white blood cell count is 10.2. Patient has a temperature of 99. Due to growth of gram-negative rods from blood cultures patient consulted for admission. Patient accepted for admission by Dr. Blaine Hamper. Patient is in agreement with admission.  This patient was discussed with Dr. Alvino Chapel who agrees with assessment and plan.    Hanley Hays, PA-C 04/26/14 2128  Jasper Riling. Alvino Chapel, MD 04/27/14 0001

## 2014-04-27 ENCOUNTER — Encounter: Payer: Self-pay | Admitting: Pulmonary Disease

## 2014-04-27 LAB — IRON AND TIBC
Iron: 13 ug/dL — ABNORMAL LOW (ref 42–145)
Saturation Ratios: 6 % — ABNORMAL LOW (ref 20–55)
TIBC: 226 ug/dL — ABNORMAL LOW (ref 250–470)
UIBC: 213 ug/dL (ref 125–400)

## 2014-04-27 LAB — FERRITIN: Ferritin: 167 ng/mL (ref 10–291)

## 2014-04-27 LAB — PROTIME-INR
INR: 1.14 (ref 0.00–1.49)
PROTHROMBIN TIME: 14.7 s (ref 11.6–15.2)

## 2014-04-27 LAB — BASIC METABOLIC PANEL
Anion gap: 8 (ref 5–15)
BUN: 28 mg/dL — AB (ref 6–23)
CO2: 26 mmol/L (ref 19–32)
CREATININE: 1.8 mg/dL — AB (ref 0.50–1.10)
Calcium: 9.4 mg/dL (ref 8.4–10.5)
Chloride: 104 mEq/L (ref 96–112)
GFR calc Af Amer: 32 mL/min — ABNORMAL LOW (ref >90)
GFR, EST NON AFRICAN AMERICAN: 28 mL/min — AB (ref >90)
GLUCOSE: 101 mg/dL — AB (ref 70–99)
POTASSIUM: 3.5 mmol/L (ref 3.5–5.1)
Sodium: 138 mmol/L (ref 135–145)

## 2014-04-27 LAB — CBC
HCT: 27.1 % — ABNORMAL LOW (ref 36.0–46.0)
HEMOGLOBIN: 8.6 g/dL — AB (ref 12.0–15.0)
MCH: 27.3 pg (ref 26.0–34.0)
MCHC: 31.7 g/dL (ref 30.0–36.0)
MCV: 86 fL (ref 78.0–100.0)
PLATELETS: 192 10*3/uL (ref 150–400)
RBC: 3.15 MIL/uL — ABNORMAL LOW (ref 3.87–5.11)
RDW: 16.4 % — ABNORMAL HIGH (ref 11.5–15.5)
WBC: 7.8 10*3/uL (ref 4.0–10.5)

## 2014-04-27 LAB — GLUCOSE, CAPILLARY: Glucose-Capillary: 85 mg/dL (ref 70–99)

## 2014-04-27 LAB — FOLATE: FOLATE: 7 ng/mL

## 2014-04-27 LAB — BRAIN NATRIURETIC PEPTIDE: B Natriuretic Peptide: 124.4 pg/mL — ABNORMAL HIGH (ref 0.0–100.0)

## 2014-04-27 LAB — VITAMIN B12: Vitamin B-12: 531 pg/mL (ref 211–911)

## 2014-04-27 LAB — RETICULOCYTES
RBC.: 3.38 MIL/uL — AB (ref 3.87–5.11)
RETIC CT PCT: 1 % (ref 0.4–3.1)
Retic Count, Absolute: 33.8 10*3/uL (ref 19.0–186.0)

## 2014-04-27 MED ORDER — PNEUMOCOCCAL VAC POLYVALENT 25 MCG/0.5ML IJ INJ
0.5000 mL | INJECTION | INTRAMUSCULAR | Status: DC
  Filled 2014-04-27 (×2): qty 0.5

## 2014-04-27 MED ORDER — AMLODIPINE BESYLATE 10 MG PO TABS
10.0000 mg | ORAL_TABLET | Freq: Every day | ORAL | Status: DC
  Administered 2014-04-28 – 2014-05-01 (×4): 10 mg via ORAL
  Filled 2014-04-27 (×5): qty 1

## 2014-04-27 NOTE — Progress Notes (Signed)
UR complete 

## 2014-04-27 NOTE — Progress Notes (Signed)
04/27/14 >> Epic notified me of pt's admission;  I last saw pt 09/01/12- she did not return as scheduled;  I called her Pharm= [facility] [address] & they indicated that all meds have been filled by DrDeterding, Nephrology;  The last note in Epic from them is from Oct2014, nothing since then is avail for review;  Hospitalists recommended to contact Nephrology regarding her current meds and recent medical care and follow up.Marland KitchenMarland Kitchen

## 2014-04-27 NOTE — Evaluation (Signed)
Physical Therapy Evaluation Patient Details Name: [name] MRN: [id number] DOB: Oct 30, 1945 Today's Date: 04/27/2014   History of Present Illness  Admitted with bacteremia.  Clinical Impression  Pt admitted with above diagnosis. Pt is at baseline level of function with no LOB noted with gait.  Occasional use of rail, but pt reports this was how she walked prior to admission.  o2 level 100% on room air with gait.  No skilled PT needs identified and will d/c from PT services.    Follow Up Recommendations No PT follow up    Equipment Recommendations       Recommendations for Other Services       Precautions / Restrictions Precautions Precautions: None      Mobility  Bed Mobility Overal bed mobility: Modified Independent                Transfers Overall transfer level: Modified independent               General transfer comment: MOD I with bed and toilet transfers  Ambulation/Gait Ambulation/Gait assistance: Modified independent (Device/Increase time);Supervision Ambulation Distance (Feet): 200 Feet Assistive device: None       General Gait Details: Pt with stiffness from arthritis with first few steps and holding onto bed, but as she "warmed" up she demonstrated improved gait.  Occasional use of rail.  Pt reports she did this prior to admission.  Stairs            Wheelchair Mobility    Modified Rankin (Stroke Patients Only)       Balance Overall balance assessment: No apparent balance deficits (not formally assessed)                                           Pertinent Vitals/Pain Pain Assessment: No/denies pain    Home Living Family/patient expects to be discharged to:: Private residence Living Arrangements: Spouse/significant other Available Help at Discharge: Family Type of Home: House Home Access: Stairs to enter Entrance Stairs-Rails: None Technical brewer of Steps: 1 step for porch Home Layout: One  level Home Equipment: Cane - single point      Prior Function Level of Independence: Independent         Comments: Pt does reports stiffness     Hand Dominance        Extremity/Trunk Assessment   Upper Extremity Assessment: Overall WFL for tasks assessed           Lower Extremity Assessment: Overall WFL for tasks assessed      Cervical / Trunk Assessment: Normal  Communication   Communication: No difficulties  Cognition Arousal/Alertness: Awake/alert Behavior During Therapy: WFL for tasks assessed/performed Overall Cognitive Status: Within Functional Limits for tasks assessed                      General Comments General comments (skin integrity, edema, etc.): Pt educated on proper technique for managing step onto porch. She states husband or son are always with her.    Exercises        Assessment/Plan    PT Assessment Patent does not need any further PT services  PT Diagnosis     PT Problem List    PT Treatment Interventions     PT Goals (Current goals can be found in the Care Plan section) Acute Rehab PT Goals PT Goal Formulation: All assessment  and education complete, DC therapy    Frequency     Barriers to discharge        Co-evaluation               End of Session Equipment Utilized During Treatment: Gait belt Activity Tolerance: Patient tolerated treatment well Patient left: in bed;with family/visitor present;with call bell/phone within reach Nurse Communication: Mobility status         Time: 1204-1224 PT Time Calculation (min) (ACUTE ONLY): 20 min   Charges:   PT Evaluation $Initial PT Evaluation Tier I: 1 Procedure PT Treatments $Gait Training: 8-22 mins   PT G Codes:        [name] 04/27/2014, 1:06 PM

## 2014-04-27 NOTE — Progress Notes (Signed)
Patient arrived to unit at about 2208 with nurse via chair. Patient steady on feet and ambulated from chair to bed and then to the bathroom independently. No assistance needed. Spouse is at the bedside. Patient is alert and oriented x4, able to verbalize needs and has no complaints at this time. Patient oriented to unit, room and call light. Patient assessed and expressed no needs at this time.

## 2014-04-27 NOTE — Progress Notes (Signed)
[facility] PROGRESS NOTE  [name] [id number] DOB: 1945-09-15 DOA: 04/26/2014 PCP: Noralee Space, MD  Assessment/Plan: Principal Problem:   Bacteremia due to Gram-negative bacteria - Most likely from urinary source - Sensitivities pending - Patient will need 14 days total of antibiotic therapy currently on day 3/14    UTI (lower urinary tract infection) - Urine Sensitivities pending  - For now continue current antibiotic regimen  Active Problems:   Anemia - obtain anemia panel to further assess    Essential hypertension - Not well controlled currently will increase amlodipine dose - Continue to monitor blood pressures    Venous (peripheral) insufficiency - Stable    BACK PAIN, LUMBAR - Continue pain management     CKD (chronic kidney disease) stage 3, GFR 30-59 ml/min - Stable but if serum creatinine continues to increase will discontinue ARB     Diastolic congestive heart failure - Currently compensated we'll plan on continuing home regimen  Code Status: Full Family Communication: No family at bedside  Disposition Plan: Pending continued improvement in condition   Consultants:  None  Procedures:  none  Antibiotics:  Rocephin  HPI/Subjective: Patient has no new complaints. She states she feels better today  Objective: Filed Vitals:   04/27/14 1410  BP: 186/91  Pulse: 80  Temp: 98.6 F (37 C)  Resp: 18    Intake/Output Summary (Last 24 hours) at 04/27/14 1755 Last data filed at 04/27/14 0900  Gross per 24 hour  Intake    240 ml  Output      0 ml  Net    240 ml   Filed Weights   04/26/14 2231 04/27/14 0625  Weight: 108.1 kg (238 lb 5.1 oz) 107.9 kg (237 lb 14 oz)    Exam:   General:  Patient in no acute distress, alert and awake  Cardiovascular: Regular rate and rhythm, no murmurs or rubs  Respiratory: Clear to auscultation bilaterally, no wheezes  Abdomen: Soft, no guarding, nondistended,  nontender  Musculoskeletal: No cyanosis or clubbing   Data Reviewed: Basic Metabolic Panel:  Recent Labs Lab 04/25/14 1935 04/26/14 2049 04/27/14 0502  NA 134* 138 138  K 4.1 3.7 3.5  CL 101 102 104  CO2 24  --  26  GLUCOSE 148* 101* 101*  BUN 27* 27* 28*  CREATININE 1.78* 1.90* 1.80*  CALCIUM 9.6  --  9.4   Liver Function Tests:  Recent Labs Lab 04/25/14 1935  AST 32  ALT 21  ALKPHOS 87  BILITOT 1.0  PROT 8.0  ALBUMIN 3.4*   No results for input(s): LIPASE, AMYLASE in the last 168 hours. No results for input(s): AMMONIA in the last 168 hours. CBC:  Recent Labs Lab 04/25/14 1935 04/26/14 2041 04/26/14 2049 04/27/14 0502  WBC 14.9* 10.2  --  7.8  NEUTROABS 12.6* 7.5  --   --   HGB 10.3* 9.5* 10.5* 8.6*  HCT 32.4* 30.5* 31.0* 27.1*  MCV 85.0 85.9  --  86.0  PLT 225 187  --  192   Cardiac Enzymes: No results for input(s): CKTOTAL, CKMB, CKMBINDEX, TROPONINI in the last 168 hours. BNP (last 3 results) No results for input(s): PROBNP in the last 8760 hours. CBG:  Recent Labs Lab 04/27/14 0737  GLUCAP 85    Recent Results (from the past 240 hour(s))  Blood culture (routine x 2)     Status: None (Preliminary result)   Collection Time: 04/25/14  7:35 PM  Result Value Ref Range Status  Specimen Description BLOOD LEFT FOREARM  Final   Special Requests BOTTLES DRAWN AEROBIC AND ANAEROBIC 5CC  Final   Culture   Final    ESCHERICHIA COLI Note: Gram Stain Report Called to,Read Back By and Verified With: Fredric Dine 04/26/14 AT 11:10 AM BY [facility] Performed at [facility]    Report Status PENDING  Incomplete  Urine culture     Status: None (Preliminary result)   Collection Time: 04/25/14  7:38 PM  Result Value Ref Range Status   Specimen Description URINE, CLEAN CATCH  Final   Special Requests NONE  Final   Colony Count   Final    >=100,000 COLONIES/ML Performed at [facility]    Culture   Final    ESCHERICHIA COLI Performed at  [facility]    Report Status PENDING  Incomplete  Blood culture (routine x 2)     Status: None (Preliminary result)   Collection Time: 04/25/14  7:43 PM  Result Value Ref Range Status   Specimen Description BLOOD LEFT HAND  Final   Special Requests BOTTLES DRAWN AEROBIC AND ANAEROBIC 5CC  Final   Culture   Final    GRAM NEGATIVE RODS Note: Gram Stain Report Called to,Read Back By and Verified With: Fredric Dine 04/26/14 AT 11:10 AM BY [facility] Performed at [facility]    Report Status PENDING  Incomplete     Studies: Dg Chest 2 View  04/25/2014   CLINICAL DATA:  Initial encounter for Cough. Congestion. Shortness of breath. Fever and weakness for 3 days.  EXAM: CHEST  2 VIEW  COMPARISON:  03/20/2011  FINDINGS: Midline trachea. Right paratracheal soft tissue fullness is similar to on the prior exam. Moderate cardiomegaly. Tortuous thoracic aorta with atherosclerosis in the transverse segment. No pleural effusion or pneumothorax. No congestive failure. Clear lungs.  IMPRESSION: Cardiomegaly without congestive failure.  Right paratracheal soft tissue fullness, present back to 2008. This is consistent with a benign etiology. Considerations include right-sided thyroid enlargement, prominent brachiocephalic vein, or less likely a benign lymph node.   Electronically Signed   By: Abigail Miyamoto M.D.   On: 04/25/2014 19:48    Scheduled Meds: . amLODipine  5 mg Oral Daily  . calcitRIOL  0.5 mcg Oral Daily  . calcium carbonate  2 tablet Oral Daily  . cefTRIAXone (ROCEPHIN)  IV  1 g Intravenous Q24H  . heparin  5,000 Units Subcutaneous 3 times per day  . labetalol  400 mg Oral BID  . losartan  100 mg Oral Daily  . multivitamin  5 mL Oral Daily  . [START ON 04/28/2014] pneumococcal 23 valent vaccine  0.5 mL Intramuscular Tomorrow-1000  . spironolactone  25 mg Oral Daily   Continuous Infusions:    Time spent: > 35 minutes    Velvet Bathe  [facility] Pager [phone] If  7PM-7AM, please contact night-coverage at [email or web address], password [facility] 04/27/2014, 5:55 PM  LOS: 1 day

## 2014-04-28 LAB — CULTURE, BLOOD (ROUTINE X 2)

## 2014-04-28 LAB — FOLATE: Folate: 8.1 ng/mL

## 2014-04-28 LAB — FERRITIN: FERRITIN: 165 ng/mL (ref 10–291)

## 2014-04-28 LAB — IRON AND TIBC
Iron: 25 ug/dL — ABNORMAL LOW (ref 42–145)
SATURATION RATIOS: 12 % — AB (ref 20–55)
TIBC: 214 ug/dL — ABNORMAL LOW (ref 250–470)
UIBC: 189 ug/dL (ref 125–400)

## 2014-04-28 LAB — URINE CULTURE
Colony Count: NO GROWTH
Culture: NO GROWTH

## 2014-04-28 LAB — VITAMIN B12: Vitamin B-12: 545 pg/mL (ref 211–911)

## 2014-04-28 LAB — RETICULOCYTES
RBC.: 3.27 MIL/uL — AB (ref 3.87–5.11)
RETIC COUNT ABSOLUTE: 42.5 10*3/uL (ref 19.0–186.0)
Retic Ct Pct: 1.3 % (ref 0.4–3.1)

## 2014-04-28 MED ORDER — ASPIRIN EC 81 MG PO TBEC
81.0000 mg | DELAYED_RELEASE_TABLET | Freq: Every day | ORAL | Status: DC | PRN
  Filled 2014-04-28: qty 1

## 2014-04-28 MED ORDER — CIPROFLOXACIN HCL 500 MG PO TABS
500.0000 mg | ORAL_TABLET | Freq: Two times a day (BID) | ORAL | Status: DC
  Administered 2014-04-28 – 2014-05-01 (×6): 500 mg via ORAL
  Filled 2014-04-28 (×8): qty 1

## 2014-04-28 MED ORDER — HYDRALAZINE HCL 20 MG/ML IJ SOLN
10.0000 mg | INTRAMUSCULAR | Status: DC | PRN
  Administered 2014-04-29: 10 mg via INTRAVENOUS
  Filled 2014-04-28: qty 1

## 2014-04-28 NOTE — Progress Notes (Signed)
[facility] PROGRESS NOTE  [name] [id number] DOB: 04/20/1945 DOA: 04/26/2014 PCP: Noralee Space, MD  Assessment/Plan: Principal Problem:   Bacteremia due to Gram-negative bacteria - Most likely from urinary source - Sensitivities pending - Patient will need 14 days total of antibiotic therapy currently on day 4/14    UTI (lower urinary tract infection) - Urine Sensitivities pending  - For now continue current antibiotic regimen  Active Problems:   Anemia - Low iron levels. Will plan on placing on iron supplementation with resolution of infection.    Essential hypertension - Not well controlled currently. Increased amlodipine dose. Increased prn IV hydralazine dose - Continue to monitor blood pressures - Pt is currently amlodipine, labetalol, losartan, spironolactone    Venous (peripheral) insufficiency - Stable    BACK PAIN, LUMBAR - Continue pain management    CKD (chronic kidney disease) stage 3, GFR 30-59 ml/min - Stable but if serum creatinine continues to increase will discontinue ARB     Diastolic congestive heart failure - Currently compensated we'll plan on continuing home regimen  Code Status: Full Family Communication: No family at bedside  Disposition Plan: Pending continued improvement in condition   Consultants:  None  Procedures:  none  Antibiotics:  Rocephin  HPI/Subjective: Patient has no new complaints. She states she feels better today  Objective: Filed Vitals:   04/28/14 1415  BP: 188/88  Pulse: 73  Temp: 98.9 F (37.2 C)  Resp: 19   No intake or output data in the 24 hours ending 04/28/14 1430 Filed Weights   04/26/14 2231 04/27/14 0625 04/28/14 0542  Weight: 108.1 kg (238 lb 5.1 oz) 107.9 kg (237 lb 14 oz) 107.1 kg (236 lb 1.8 oz)    Exam:   General:  Patient in no acute distress, alert and awake  Cardiovascular: Regular rate and rhythm, no murmurs or rubs  Respiratory: Clear to auscultation  bilaterally, no wheezes  Abdomen: Soft, no guarding, nondistended, nontender  Musculoskeletal: No cyanosis or clubbing   Data Reviewed: Basic Metabolic Panel:  Recent Labs Lab 04/25/14 1935 04/26/14 2049 04/27/14 0502  NA 134* 138 138  K 4.1 3.7 3.5  CL 101 102 104  CO2 24  --  26  GLUCOSE 148* 101* 101*  BUN 27* 27* 28*  CREATININE 1.78* 1.90* 1.80*  CALCIUM 9.6  --  9.4   Liver Function Tests:  Recent Labs Lab 04/25/14 1935  AST 32  ALT 21  ALKPHOS 87  BILITOT 1.0  PROT 8.0  ALBUMIN 3.4*   No results for input(s): LIPASE, AMYLASE in the last 168 hours. No results for input(s): AMMONIA in the last 168 hours. CBC:  Recent Labs Lab 04/25/14 1935 04/26/14 2041 04/26/14 2049 04/27/14 0502  WBC 14.9* 10.2  --  7.8  NEUTROABS 12.6* 7.5  --   --   HGB 10.3* 9.5* 10.5* 8.6*  HCT 32.4* 30.5* 31.0* 27.1*  MCV 85.0 85.9  --  86.0  PLT 225 187  --  192   Cardiac Enzymes: No results for input(s): CKTOTAL, CKMB, CKMBINDEX, TROPONINI in the last 168 hours. BNP (last 3 results) No results for input(s): PROBNP in the last 8760 hours. CBG:  Recent Labs Lab 04/27/14 0737  GLUCAP 85    Recent Results (from the past 240 hour(s))  Blood culture (routine x 2)     Status: None   Collection Time: 04/25/14  7:35 PM  Result Value Ref Range Status   Specimen Description BLOOD LEFT FOREARM  Final  Special Requests BOTTLES DRAWN AEROBIC AND ANAEROBIC 5CC  Final   Culture   Final    ESCHERICHIA COLI Note: Gram Stain Report Called to,Read Back By and Verified With: Fredric Dine 04/26/14 AT 11:10 AM BY [facility] Performed at [facility]    Report Status 04/28/2014 FINAL  Final   Organism ID, Bacteria ESCHERICHIA COLI  Final      Susceptibility   Escherichia coli - MIC*    AMPICILLIN >=32 RESISTANT Resistant     AMPICILLIN/SULBACTAM >=32 RESISTANT Resistant     CEFAZOLIN <=4 SENSITIVE Sensitive     CEFEPIME <=1 SENSITIVE Sensitive     CEFTAZIDIME <=1 SENSITIVE  Sensitive     CEFTRIAXONE <=1 SENSITIVE Sensitive     CIPROFLOXACIN <=0.25 SENSITIVE Sensitive     GENTAMICIN <=1 SENSITIVE Sensitive     IMIPENEM <=0.25 SENSITIVE Sensitive     PIP/TAZO <=4 SENSITIVE Sensitive     TOBRAMYCIN <=1 SENSITIVE Sensitive     TRIMETH/SULFA >=320 RESISTANT Resistant     * ESCHERICHIA COLI  Urine culture     Status: None   Collection Time: 04/25/14  7:38 PM  Result Value Ref Range Status   Specimen Description URINE, CLEAN CATCH  Final   Special Requests NONE  Final   Colony Count   Final    >=100,000 COLONIES/ML Performed at [facility]    Culture   Final    ESCHERICHIA COLI Performed at [facility]    Report Status 04/28/2014 FINAL  Final   Organism ID, Bacteria ESCHERICHIA COLI  Final      Susceptibility   Escherichia coli - MIC*    AMPICILLIN >=32 RESISTANT Resistant     CEFAZOLIN <=4 SENSITIVE Sensitive     CEFTRIAXONE <=1 SENSITIVE Sensitive     CIPROFLOXACIN 0.5 SENSITIVE Sensitive     GENTAMICIN <=1 SENSITIVE Sensitive     LEVOFLOXACIN 1 SENSITIVE Sensitive     NITROFURANTOIN <=16 SENSITIVE Sensitive     TOBRAMYCIN <=1 SENSITIVE Sensitive     TRIMETH/SULFA >=320 RESISTANT Resistant     PIP/TAZO <=4 SENSITIVE Sensitive     * ESCHERICHIA COLI  Blood culture (routine x 2)     Status: None   Collection Time: 04/25/14  7:43 PM  Result Value Ref Range Status   Specimen Description BLOOD LEFT HAND  Final   Special Requests BOTTLES DRAWN AEROBIC AND ANAEROBIC 5CC  Final   Culture   Final    ESCHERICHIA COLI Note: SUSCEPTIBILITIES PERFORMED ON PREVIOUS CULTURE WITHIN THE LAST 5 DAYS. Note: Gram Stain Report Called to,Read Back By and Verified With: Fredric Dine 04/26/14 AT 11:10 AM BY [facility] Performed at [facility]    Report Status 04/28/2014 FINAL  Final  Culture, blood (routine x 2)     Status: None (Preliminary result)   Collection Time: 04/26/14 11:55 PM  Result Value Ref Range Status   Specimen Description  BLOOD LEFT FOREARM  Final   Special Requests BOTTLES DRAWN AEROBIC ONLY 5ML  Final   Culture   Final           BLOOD CULTURE RECEIVED NO GROWTH TO DATE CULTURE WILL BE HELD FOR 5 DAYS BEFORE ISSUING A FINAL NEGATIVE REPORT Performed at [facility]    Report Status PENDING  Incomplete  Culture, blood (routine x 2)     Status: None (Preliminary result)   Collection Time: 04/27/14 12:01 AM  Result Value Ref Range Status   Specimen Description BLOOD LEFT ARM  Final   Special Requests BOTTLES DRAWN AEROBIC ONLY 5ML  Final   Culture   Final           BLOOD CULTURE RECEIVED NO GROWTH TO DATE CULTURE WILL BE HELD FOR 5 DAYS BEFORE ISSUING A FINAL NEGATIVE REPORT Performed at [facility]    Report Status PENDING  Incomplete  Urine culture     Status: None   Collection Time: 04/27/14  3:39 AM  Result Value Ref Range Status   Specimen Description URINE, RANDOM  Final   Special Requests NONE  Final   Colony Count NO GROWTH Performed at [facility]   Final   Culture NO GROWTH Performed at [facility]   Final   Report Status 04/28/2014 FINAL  Final     Studies: No results found.  Scheduled Meds: . amLODipine  10 mg Oral Daily  . calcitRIOL  0.5 mcg Oral Daily  . calcium carbonate  2 tablet Oral Daily  . cefTRIAXone (ROCEPHIN)  IV  1 g Intravenous Q24H  . heparin  5,000 Units Subcutaneous 3 times per day  . labetalol  400 mg Oral BID  . losartan  100 mg Oral Daily  . multivitamin  5 mL Oral Daily  . pneumococcal 23 valent vaccine  0.5 mL Intramuscular Tomorrow-1000  . spironolactone  25 mg Oral Daily   Continuous Infusions:    Time spent: > 35 minutes    Velvet Bathe  [facility] Pager [phone] If 7PM-7AM, please contact night-coverage at [email or web address], password [facility] 04/28/2014, 2:30 PM  LOS: 2 days

## 2014-04-28 NOTE — Progress Notes (Signed)
CARE MANAGEMENT NOTE 04/28/2014  Patient:  [name],[name]   Account Number:  [id number]  Date Initiated:  04/28/2014  Documentation initiated by:  Edwyna Shell  Subjective/Objective Assessment:   69 yo female admitted with bacteremia from home     Action/Plan:   discharge planning   Anticipated DC Date:  04/29/2014   Anticipated DC Plan:  [place]  CM consult      Choice offered to / List presented to:             Status of service:  Completed, signed off Medicare Important Message given?   (If response is "NO", the following Medicare IM given date fields will be blank) Date Medicare IM given:   Medicare IM given by:   Date Additional Medicare IM given:   Additional Medicare IM given by:    Discharge Disposition:  HOME/SELF CARE  Per UR Regulation:    If discussed at Long Length of Stay Meetings, dates discussed:    Comments:  04/28/14 Edwyna Shell RN BSN CM [phone] Patient lives at home with spouse and has a son that is a support. She has a PCP and a pharmacy and has no dc needs at this time. Will continue to follow

## 2014-04-28 NOTE — Evaluation (Signed)
Occupational Therapy Evaluation Patient Details Name: [name] MRN: [id number] DOB: 1946-03-15 Today's Date: 04/28/2014    History of Present Illness Admitted with bacteremia.   Clinical Impression   This 69 year old female was admitted for the above.  At baseline, she has pain in knees--she states back really doesn't hurt.  She wears support, compression hose and has AE to help to don these.  She is mostly mod I for adls but could not clear LLE over simulated tub today.  No further OT is needed at this time.    Follow Up Recommendations  No OT follow up    Equipment Recommendations   (pt is considering a different shower seat)    Recommendations for Other Services       Precautions / Restrictions Precautions Precautions: None Restrictions Weight Bearing Restrictions: No      Mobility Bed Mobility                  Transfers Overall transfer level: Modified independent                    Balance Overall balance assessment: No apparent balance deficits (not formally assessed) (tends to hold to walls at times)                                          ADL Overall ADL's : Needs assistance/impaired;At baseline                                       General ADL Comments: Pt is mod I for adls--at baseline.  Simulated tub transfer, and she cannot lift LLE up high enough to clear ledge at this time.  May need to sponge bathe  until she can do this.  Pt has very good safety awareness     Vision                     Perception     Praxis      Pertinent Vitals/Pain Pain Assessment: No/denies pain     Hand Dominance     Extremity/Trunk Assessment Upper Extremity Assessment Upper Extremity Assessment: Overall WFL for tasks assessed       Cervical / Trunk Assessment Cervical / Trunk Assessment:  (neck forward; shoulders elevated; ROM wfls)   Communication Communication Communication: No difficulties    Cognition Arousal/Alertness: Awake/alert Behavior During Therapy: WFL for tasks assessed/performed Overall Cognitive Status: Within Functional Limits for tasks assessed                     General Comments       Exercises       Shoulder Instructions      Home Living Family/patient expects to be discharged to:: Private residence Living Arrangements: Spouse/significant other Available Help at Discharge: Family               Bathroom Shower/Tub: Tub/shower unit Shower/tub characteristics: Curtain Biochemist, clinical: Standard     Home Equipment: Radio producer - single point   Additional Comments: she has a shower seat which she doesn't like:  may get a different one; grab bar in tub      Prior Functioning/Environment Level of Independence: Independent  OT Diagnosis: Generalized weakness   OT Problem List:     OT Treatment/Interventions:      OT Goals(Current goals can be found in the care plan section)    OT Frequency:     Barriers to D/C:            Co-evaluation              End of Session    Activity Tolerance: Patient tolerated treatment well Patient left: with call bell/phone within reach (on wall seat)   Time: [id number] OT Time Calculation (min): 20 min Charges:  OT General Charges $OT Visit: 1 Procedure OT Evaluation $Initial OT Evaluation Tier I: 1 Procedure G-Codes:    Ezel Vallone 26-May-2014, 9:52 AM Lesle Chris, OTR/L [phone] 2014/05/26

## 2014-04-29 LAB — GLUCOSE, CAPILLARY: Glucose-Capillary: 94 mg/dL (ref 70–99)

## 2014-04-29 MED ORDER — HYDRALAZINE HCL 25 MG PO TABS
25.0000 mg | ORAL_TABLET | Freq: Three times a day (TID) | ORAL | Status: DC
  Administered 2014-04-29 – 2014-04-30 (×3): 25 mg via ORAL
  Filled 2014-04-29 (×6): qty 1

## 2014-04-29 NOTE — Progress Notes (Signed)
[facility] PROGRESS NOTE  [name] [id number] DOB: 11-12-1945 DOA: 04/26/2014 PCP: Noralee Space, MD  Assessment/Plan: Principal Problem:   Bacteremia due to E coli sensitive to Cipro - Most likely from urinary source - Sensitivities pending - Patient will need 14 days total of antibiotic therapy currently on day 5/14    UTI (lower urinary tract infection) - Urine Sensitivities pending  - Pt tolerating Cipro  Active Problems:   Anemia - Low iron levels. Will plan on placing on iron supplementation with resolution of infection.    Essential hypertension - Not well controlled currently. Increased amlodipine dose. Increased prn IV hydralazine dose - Will add oral hydralazine to regimen given that blood pressure is still not well controlled. - Continue to monitor blood pressures - Pt is currently amlodipine, labetalol, losartan, spironolactone    Venous (peripheral) insufficiency - Stable    BACK PAIN, LUMBAR - Continue pain management    CKD (chronic kidney disease) stage 3, GFR 30-59 ml/min - Stable but if serum creatinine continues to increase will discontinue ARB     Diastolic congestive heart failure - Currently compensated we'll plan on continuing home regimen  Code Status: Full Family Communication: No family at bedside  Disposition Plan: Pending continued improvement in condition   Consultants:  None  Procedures:  none  Antibiotics:  Rocephin  HPI/Subjective: Patient has no new complaints.   Objective: Filed Vitals:   04/29/14 1516  BP: 176/82  Pulse: 76  Temp: 98.2 F (36.8 C)  Resp: 18    Intake/Output Summary (Last 24 hours) at 04/29/14 1530 Last data filed at 04/29/14 1100  Gross per 24 hour  Intake    240 ml  Output      0 ml  Net    240 ml   Filed Weights   04/27/14 0625 04/28/14 0542 04/29/14 0627  Weight: 107.9 kg (237 lb 14 oz) 107.1 kg (236 lb 1.8 oz) 105.688 kg (233 lb)    Exam:   General:  Patient in no  acute distress, alert and awake  Cardiovascular: Regular rate and rhythm, no murmurs or rubs  Respiratory: Clear to auscultation bilaterally, no wheezes  Abdomen: Soft, no guarding, nondistended, nontender  Musculoskeletal: No cyanosis or clubbing   Data Reviewed: Basic Metabolic Panel:  Recent Labs Lab 04/25/14 1935 04/26/14 2049 04/27/14 0502  NA 134* 138 138  K 4.1 3.7 3.5  CL 101 102 104  CO2 24  --  26  GLUCOSE 148* 101* 101*  BUN 27* 27* 28*  CREATININE 1.78* 1.90* 1.80*  CALCIUM 9.6  --  9.4   Liver Function Tests:  Recent Labs Lab 04/25/14 1935  AST 32  ALT 21  ALKPHOS 87  BILITOT 1.0  PROT 8.0  ALBUMIN 3.4*   No results for input(s): LIPASE, AMYLASE in the last 168 hours. No results for input(s): AMMONIA in the last 168 hours. CBC:  Recent Labs Lab 04/25/14 1935 04/26/14 2041 04/26/14 2049 04/27/14 0502  WBC 14.9* 10.2  --  7.8  NEUTROABS 12.6* 7.5  --   --   HGB 10.3* 9.5* 10.5* 8.6*  HCT 32.4* 30.5* 31.0* 27.1*  MCV 85.0 85.9  --  86.0  PLT 225 187  --  192   Cardiac Enzymes: No results for input(s): CKTOTAL, CKMB, CKMBINDEX, TROPONINI in the last 168 hours. BNP (last 3 results) No results for input(s): PROBNP in the last 8760 hours. CBG:  Recent Labs Lab 04/27/14 0737 04/29/14 0728  GLUCAP 85 94  Recent Results (from the past 240 hour(s))  Blood culture (routine x 2)     Status: None   Collection Time: 04/25/14  7:35 PM  Result Value Ref Range Status   Specimen Description BLOOD LEFT FOREARM  Final   Special Requests BOTTLES DRAWN AEROBIC AND ANAEROBIC 5CC  Final   Culture   Final    ESCHERICHIA COLI Note: Gram Stain Report Called to,Read Back By and Verified With: Fredric Dine 04/26/14 AT 11:10 AM BY [facility] Performed at [facility]    Report Status 04/28/2014 FINAL  Final   Organism ID, Bacteria ESCHERICHIA COLI  Final      Susceptibility   Escherichia coli - MIC*    AMPICILLIN >=32 RESISTANT Resistant      AMPICILLIN/SULBACTAM >=32 RESISTANT Resistant     CEFAZOLIN <=4 SENSITIVE Sensitive     CEFEPIME <=1 SENSITIVE Sensitive     CEFTAZIDIME <=1 SENSITIVE Sensitive     CEFTRIAXONE <=1 SENSITIVE Sensitive     CIPROFLOXACIN <=0.25 SENSITIVE Sensitive     GENTAMICIN <=1 SENSITIVE Sensitive     IMIPENEM <=0.25 SENSITIVE Sensitive     PIP/TAZO <=4 SENSITIVE Sensitive     TOBRAMYCIN <=1 SENSITIVE Sensitive     TRIMETH/SULFA >=320 RESISTANT Resistant     * ESCHERICHIA COLI  Urine culture     Status: None   Collection Time: 04/25/14  7:38 PM  Result Value Ref Range Status   Specimen Description URINE, CLEAN CATCH  Final   Special Requests NONE  Final   Colony Count   Final    >=100,000 COLONIES/ML Performed at [facility]    Culture   Final    ESCHERICHIA COLI Performed at [facility]    Report Status 04/28/2014 FINAL  Final   Organism ID, Bacteria ESCHERICHIA COLI  Final      Susceptibility   Escherichia coli - MIC*    AMPICILLIN >=32 RESISTANT Resistant     CEFAZOLIN <=4 SENSITIVE Sensitive     CEFTRIAXONE <=1 SENSITIVE Sensitive     CIPROFLOXACIN 0.5 SENSITIVE Sensitive     GENTAMICIN <=1 SENSITIVE Sensitive     LEVOFLOXACIN 1 SENSITIVE Sensitive     NITROFURANTOIN <=16 SENSITIVE Sensitive     TOBRAMYCIN <=1 SENSITIVE Sensitive     TRIMETH/SULFA >=320 RESISTANT Resistant     PIP/TAZO <=4 SENSITIVE Sensitive     * ESCHERICHIA COLI  Blood culture (routine x 2)     Status: None   Collection Time: 04/25/14  7:43 PM  Result Value Ref Range Status   Specimen Description BLOOD LEFT HAND  Final   Special Requests BOTTLES DRAWN AEROBIC AND ANAEROBIC 5CC  Final   Culture   Final    ESCHERICHIA COLI Note: SUSCEPTIBILITIES PERFORMED ON PREVIOUS CULTURE WITHIN THE LAST 5 DAYS. Note: Gram Stain Report Called to,Read Back By and Verified With: Fredric Dine 04/26/14 AT 11:10 AM BY [facility] Performed at [facility]    Report Status 04/28/2014 FINAL  Final  Culture,  blood (routine x 2)     Status: None (Preliminary result)   Collection Time: 04/26/14 11:55 PM  Result Value Ref Range Status   Specimen Description BLOOD LEFT FOREARM  Final   Special Requests BOTTLES DRAWN AEROBIC ONLY 5ML  Final   Culture   Final           BLOOD CULTURE RECEIVED NO GROWTH TO DATE CULTURE WILL BE HELD FOR 5 DAYS BEFORE ISSUING A FINAL NEGATIVE REPORT Performed at [facility]    Report Status PENDING  Incomplete  Culture, blood (routine x 2)     Status: None (Preliminary result)   Collection Time: 04/27/14 12:01 AM  Result Value Ref Range Status   Specimen Description BLOOD LEFT ARM  Final   Special Requests BOTTLES DRAWN AEROBIC ONLY 5ML  Final   Culture   Final           BLOOD CULTURE RECEIVED NO GROWTH TO DATE CULTURE WILL BE HELD FOR 5 DAYS BEFORE ISSUING A FINAL NEGATIVE REPORT Performed at [facility]    Report Status PENDING  Incomplete  Urine culture     Status: None   Collection Time: 04/27/14  3:39 AM  Result Value Ref Range Status   Specimen Description URINE, RANDOM  Final   Special Requests NONE  Final   Colony Count NO GROWTH Performed at [facility]   Final   Culture NO GROWTH Performed at [facility]   Final   Report Status 04/28/2014 FINAL  Final     Studies: No results found.  Scheduled Meds: . amLODipine  10 mg Oral Daily  . calcitRIOL  0.5 mcg Oral Daily  . calcium carbonate  2 tablet Oral Daily  . ciprofloxacin  500 mg Oral BID  . heparin  5,000 Units Subcutaneous 3 times per day  . hydrALAZINE  25 mg Oral 3 times per day  . labetalol  400 mg Oral BID  . losartan  100 mg Oral Daily  . multivitamin  5 mL Oral Daily  . pneumococcal 23 valent vaccine  0.5 mL Intramuscular Tomorrow-1000  . spironolactone  25 mg Oral Daily   Continuous Infusions:    Time spent: > 35 minutes    Velvet Bathe  [facility] Pager [phone] If 7PM-7AM, please contact night-coverage at [email or web address],  password [facility] 04/29/2014, 3:30 PM  LOS: 3 days

## 2014-04-30 ENCOUNTER — Telehealth (HOSPITAL_BASED_OUTPATIENT_CLINIC_OR_DEPARTMENT_OTHER): Payer: Self-pay | Admitting: Emergency Medicine

## 2014-04-30 MED ORDER — HYDRALAZINE HCL 50 MG PO TABS
50.0000 mg | ORAL_TABLET | Freq: Three times a day (TID) | ORAL | Status: DC
  Administered 2014-04-30 – 2014-05-01 (×4): 50 mg via ORAL
  Filled 2014-04-30 (×8): qty 1

## 2014-04-30 NOTE — Progress Notes (Signed)
[facility] PROGRESS NOTE  [name] [id number] DOB: December 31, 1945 DOA: 04/26/2014 PCP: Noralee Space, MD  Assessment/Plan: Principal Problem:   Bacteremia due to E coli sensitive to Cipro - Most likely from urinary source - Sensitivities pending - Patient will need 14 days total of antibiotic therapy currently on day 6/14    UTI (lower urinary tract infection) - Urine Sensitivities pending  - Pt tolerating Cipro  Active Problems:   Anemia - Low iron levels. Will plan on placing on iron supplementation with resolution of infection.    Essential hypertension - Not well controlled currently. Increased amlodipine dose. Increased prn IV hydralazine dose - Increase oral hydralazine dose today - Continue to monitor blood pressures - Pt is currently amlodipine, labetalol, losartan, spironolactone, and added hydralazine pills recently    Venous (peripheral) insufficiency - Stable    BACK PAIN, LUMBAR - Continue pain management    CKD (chronic kidney disease) stage 3, GFR 30-59 ml/min - Stable but if serum creatinine continues to increase will discontinue ARB     Diastolic congestive heart failure - Currently compensated we'll plan on continuing home regimen  Code Status: Full Family Communication: No family at bedside  Disposition Plan: With improvement in blood pressure control   Consultants:  None  Procedures:  none  Antibiotics:  Rocephin  HPI/Subjective: Patient has no new complaints.   Objective: Filed Vitals:   04/30/14 0628  BP: 180/79  Pulse:   Temp:   Resp:    No intake or output data in the 24 hours ending 04/30/14 1429 Filed Weights   04/27/14 0625 04/28/14 0542 04/29/14 0627  Weight: 107.9 kg (237 lb 14 oz) 107.1 kg (236 lb 1.8 oz) 105.688 kg (233 lb)    Exam:   General:  Patient in no acute distress, alert and awake  Cardiovascular: Regular rate and rhythm, no murmurs or rubs  Respiratory: Clear to auscultation bilaterally,  no wheezes  Abdomen: Soft, no guarding, nondistended, nontender  Musculoskeletal: No cyanosis or clubbing   Data Reviewed: Basic Metabolic Panel:  Recent Labs Lab 04/25/14 1935 04/26/14 2049 04/27/14 0502  NA 134* 138 138  K 4.1 3.7 3.5  CL 101 102 104  CO2 24  --  26  GLUCOSE 148* 101* 101*  BUN 27* 27* 28*  CREATININE 1.78* 1.90* 1.80*  CALCIUM 9.6  --  9.4   Liver Function Tests:  Recent Labs Lab 04/25/14 1935  AST 32  ALT 21  ALKPHOS 87  BILITOT 1.0  PROT 8.0  ALBUMIN 3.4*   No results for input(s): LIPASE, AMYLASE in the last 168 hours. No results for input(s): AMMONIA in the last 168 hours. CBC:  Recent Labs Lab 04/25/14 1935 04/26/14 2041 04/26/14 2049 04/27/14 0502  WBC 14.9* 10.2  --  7.8  NEUTROABS 12.6* 7.5  --   --   HGB 10.3* 9.5* 10.5* 8.6*  HCT 32.4* 30.5* 31.0* 27.1*  MCV 85.0 85.9  --  86.0  PLT 225 187  --  192   Cardiac Enzymes: No results for input(s): CKTOTAL, CKMB, CKMBINDEX, TROPONINI in the last 168 hours. BNP (last 3 results) No results for input(s): PROBNP in the last 8760 hours. CBG:  Recent Labs Lab 04/27/14 0737 04/29/14 0728  GLUCAP 85 94    Recent Results (from the past 240 hour(s))  Blood culture (routine x 2)     Status: None   Collection Time: 04/25/14  7:35 PM  Result Value Ref Range Status   Specimen Description BLOOD  LEFT FOREARM  Final   Special Requests BOTTLES DRAWN AEROBIC AND ANAEROBIC 5CC  Final   Culture   Final    ESCHERICHIA COLI Note: Gram Stain Report Called to,Read Back By and Verified With: Fredric Dine 04/26/14 AT 11:10 AM BY [facility] Performed at [facility]    Report Status 04/28/2014 FINAL  Final   Organism ID, Bacteria ESCHERICHIA COLI  Final      Susceptibility   Escherichia coli - MIC*    AMPICILLIN >=32 RESISTANT Resistant     AMPICILLIN/SULBACTAM >=32 RESISTANT Resistant     CEFAZOLIN <=4 SENSITIVE Sensitive     CEFEPIME <=1 SENSITIVE Sensitive     CEFTAZIDIME <=1  SENSITIVE Sensitive     CEFTRIAXONE <=1 SENSITIVE Sensitive     CIPROFLOXACIN <=0.25 SENSITIVE Sensitive     GENTAMICIN <=1 SENSITIVE Sensitive     IMIPENEM <=0.25 SENSITIVE Sensitive     PIP/TAZO <=4 SENSITIVE Sensitive     TOBRAMYCIN <=1 SENSITIVE Sensitive     TRIMETH/SULFA >=320 RESISTANT Resistant     * ESCHERICHIA COLI  Urine culture     Status: None   Collection Time: 04/25/14  7:38 PM  Result Value Ref Range Status   Specimen Description URINE, CLEAN CATCH  Final   Special Requests NONE  Final   Colony Count   Final    >=100,000 COLONIES/ML Performed at [facility]    Culture   Final    ESCHERICHIA COLI Performed at [facility]    Report Status 04/28/2014 FINAL  Final   Organism ID, Bacteria ESCHERICHIA COLI  Final      Susceptibility   Escherichia coli - MIC*    AMPICILLIN >=32 RESISTANT Resistant     CEFAZOLIN <=4 SENSITIVE Sensitive     CEFTRIAXONE <=1 SENSITIVE Sensitive     CIPROFLOXACIN 0.5 SENSITIVE Sensitive     GENTAMICIN <=1 SENSITIVE Sensitive     LEVOFLOXACIN 1 SENSITIVE Sensitive     NITROFURANTOIN <=16 SENSITIVE Sensitive     TOBRAMYCIN <=1 SENSITIVE Sensitive     TRIMETH/SULFA >=320 RESISTANT Resistant     PIP/TAZO <=4 SENSITIVE Sensitive     * ESCHERICHIA COLI  Blood culture (routine x 2)     Status: None   Collection Time: 04/25/14  7:43 PM  Result Value Ref Range Status   Specimen Description BLOOD LEFT HAND  Final   Special Requests BOTTLES DRAWN AEROBIC AND ANAEROBIC 5CC  Final   Culture   Final    ESCHERICHIA COLI Note: SUSCEPTIBILITIES PERFORMED ON PREVIOUS CULTURE WITHIN THE LAST 5 DAYS. Note: Gram Stain Report Called to,Read Back By and Verified With: Fredric Dine 04/26/14 AT 11:10 AM BY [facility] Performed at [facility]    Report Status 04/28/2014 FINAL  Final  Culture, blood (routine x 2)     Status: None (Preliminary result)   Collection Time: 04/26/14 11:55 PM  Result Value Ref Range Status   Specimen  Description BLOOD LEFT FOREARM  Final   Special Requests BOTTLES DRAWN AEROBIC ONLY 5ML  Final   Culture   Final           BLOOD CULTURE RECEIVED NO GROWTH TO DATE CULTURE WILL BE HELD FOR 5 DAYS BEFORE ISSUING A FINAL NEGATIVE REPORT Performed at [facility]    Report Status PENDING  Incomplete  Culture, blood (routine x 2)     Status: None (Preliminary result)   Collection Time: 04/27/14 12:01 AM  Result Value Ref Range Status  Specimen Description BLOOD LEFT ARM  Final   Special Requests BOTTLES DRAWN AEROBIC ONLY 5ML  Final   Culture   Final           BLOOD CULTURE RECEIVED NO GROWTH TO DATE CULTURE WILL BE HELD FOR 5 DAYS BEFORE ISSUING A FINAL NEGATIVE REPORT Performed at [facility]    Report Status PENDING  Incomplete  Urine culture     Status: None   Collection Time: 04/27/14  3:39 AM  Result Value Ref Range Status   Specimen Description URINE, RANDOM  Final   Special Requests NONE  Final   Colony Count NO GROWTH Performed at [facility]   Final   Culture NO GROWTH Performed at [facility]   Final   Report Status 04/28/2014 FINAL  Final     Studies: No results found.  Scheduled Meds: . amLODipine  10 mg Oral Daily  . calcitRIOL  0.5 mcg Oral Daily  . calcium carbonate  2 tablet Oral Daily  . ciprofloxacin  500 mg Oral BID  . heparin  5,000 Units Subcutaneous 3 times per day  . hydrALAZINE  50 mg Oral 3 times per day  . labetalol  400 mg Oral BID  . losartan  100 mg Oral Daily  . multivitamin  5 mL Oral Daily  . pneumococcal 23 valent vaccine  0.5 mL Intramuscular Tomorrow-1000  . spironolactone  25 mg Oral Daily   Continuous Infusions:    Time spent: > 35 minutes    Velvet Bathe  [facility] Pager [phone] If 7PM-7AM, please contact night-coverage at [email or web address], password [facility] 04/30/2014, 2:29 PM  LOS: 4 days

## 2014-04-30 NOTE — Telephone Encounter (Signed)
Post ED Visit - Positive Culture Follow-up  Culture report reviewed by antimicrobial stewardship pharmacist: []  Wes Haskell, Pharm.D., BCPS []  Heide Guile, Pharm.D., BCPS []  Alycia Rossetti, Pharm.D., BCPS [x]  [place], [place].D., BCPS, AAHIVP []  Legrand Como, Pharm.D., BCPS, AAHIVP []  Isac Sarna, Pharm.D., BCPS  Positive Blood culture Patient current inpatient @ [address]  Georgina Peer [facility] 04/30/2014, 12:48 PM

## 2014-05-01 LAB — GLUCOSE, CAPILLARY
GLUCOSE-CAPILLARY: 100 mg/dL — AB (ref 70–99)
Glucose-Capillary: 96 mg/dL (ref 70–99)
Glucose-Capillary: 110 mg/dL — ABNORMAL HIGH (ref 70–99)

## 2014-05-01 MED ORDER — AMLODIPINE BESYLATE 10 MG PO TABS: 10.0000 mg | ORAL_TABLET | Freq: Every day | ORAL | Status: DC

## 2014-05-01 MED ORDER — CIPROFLOXACIN HCL 500 MG PO TABS: 500.0000 mg | ORAL_TABLET | Freq: Two times a day (BID) | ORAL | Status: DC

## 2014-05-01 MED ORDER — HYDRALAZINE HCL 50 MG PO TABS: 50.0000 mg | ORAL_TABLET | Freq: Three times a day (TID) | ORAL | Status: DC

## 2014-05-01 NOTE — Discharge Summary (Signed)
Physician Discharge Summary  [name] [id number] DOB: 1945/12/11 DOA: 04/26/2014  PCP: Noralee Space, MD  Admit date: 04/26/2014 Discharge date: 05/01/2014  Time spent: > 35 minutes  Recommendations for Outpatient Follow-up:  1. Patient will receive 2 weeks of antibiotic therapy for Escherichia coli bacteremia 2. Monitor blood pressures and adjust antihypertensive medications  Discharge Diagnoses:  Principal Problem:   Bacteremia due to Gram-negative bacteria Active Problems:   Anemia   Essential hypertension   Venous (peripheral) insufficiency   BACK PAIN, LUMBAR   Bacteremia   CKD (chronic kidney disease) stage 3, GFR 30-59 ml/min   UTI (lower urinary tract infection)   Diastolic congestive heart failure   Discharge Condition: Stable  Diet recommendation: Low sodium diet/heart healthy  Filed Weights   04/28/14 0542 04/29/14 0627 05/01/14 0509  Weight: 107.1 kg (236 lb 1.8 oz) 105.688 kg (233 lb) 106 kg (233 lb 11 oz)    History of present illness:  From original history of present illness: 69 y.o. female with past medical history of hypertension, anemia, chronic kidney disease-stage III, diastolic congestive heart failure, who presents with burning on urination.  Patient reports that she has a burning sensation on urination during last week, but denies dysuria or increased urinary frequency. She also has fever and chills. She has a pressure like feeling in the suprapubic area. She does not have nausea, vomiting, diarrhea, abdominal pain. Was evaluated in the emergency room last night, and diagnosed with UTI. She was discharged home on oral Keflex after receiving 1 dose of IV antibiotics. Blood culture was collected which comes back positive for gram-negative rod today. She was otherwise to come to the emergency room for further evaluation and treatment. She has chronic leg edema and back pain. Patient denies chest pain, SOB, abdominal pain, diarrhea, constipation, skin  rashes.   Hospital Course:  Bacteremia - Growing Escherichia coli sensitive to Cipro - Has completed 7 days of antibiotic therapy will discharge with 8 days of Cipro as patient only received half of her dose on discharge day.  Essential hypertension - Difficult to control and patient required up titration of blood pressure medication. I added hydralazine tablets and I increased amlodipine from 5 mg at 10 mg. - We'll recommend primary care physician continue to monitor and adjust blood pressure medications accordingly.  Procedures:  None  Consultations:  None  Discharge Exam: Filed Vitals:   05/01/14 0940  BP: 165/80  Pulse: 89  Temp: 97.5 F (36.4 C)  Resp: 20    General: Patient in no acute distress, alert and awake Cardiovascular: Regular rate and rhythm, no murmurs or rubs Respiratory: Clear to auscultation bilaterally, no wheezes  Discharge Instructions   Discharge Instructions    Call MD for:  difficulty breathing, headache or visual disturbances    Complete by:  As directed      Call MD for:  severe uncontrolled pain    Complete by:  As directed      Call MD for:  temperature >100.4    Complete by:  As directed      Diet - low sodium heart healthy    Complete by:  As directed      Discharge instructions    Complete by:  As directed   Please be sure to follow up with your primary care physician within the next one to 2 weeks or sooner should any new concerns arise.     Increase activity slowly    Complete by:  As directed  Current Discharge Medication List    START taking these medications   Details  ciprofloxacin (CIPRO) 500 MG tablet Take 1 tablet (500 mg total) by mouth 2 (two) times daily. Qty: 16 tablet, Refills: 0    hydrALAZINE (APRESOLINE) 50 MG tablet Take 1 tablet (50 mg total) by mouth every 8 (eight) hours. Qty: 90 tablet, Refills: 0      CONTINUE these medications which have CHANGED   Details  amLODipine (NORVASC) 10 MG  tablet Take 1 tablet (10 mg total) by mouth daily. Qty: 30 tablet, Refills: 0      CONTINUE these medications which have NOT CHANGED   Details  CALCITRIOL PO Take 1 capsule by mouth daily.    clobetasol ointment (TEMOVATE) [id number] % Apply 1 application topically daily as needed (itching). Use as directed    dextromethorphan (DELSYM) 30 MG/5ML liquid Take 30 mg by mouth daily as needed for cough (cough).    hydrocortisone (ANUSOL-HC) 2.5 % rectal cream Place 1 application rectally as needed. Qty: 30 g, Refills: 0    labetalol (NORMODYNE) 200 MG tablet TAKE 2 TABLETS BY MOUTH TWICE DAILY Qty: 120 tablet, Refills: 0    losartan (COZAAR) 100 MG tablet TAKE 1 TABLET BY MOUTH EVERY DAY Qty: 30 tablet, Refills: 11    Multiple Vitamins-Minerals (MULTIVITAMIN PO) Take 1 tablet by mouth daily.      potassium chloride SA (K-DUR,KLOR-CON) 20 MEQ tablet TAKE 1 TABLET BY MOUTH DAILY Qty: 30 tablet, Refills: 6    spironolactone (ALDACTONE) 25 MG tablet Take 1 tablet (25 mg total) by mouth daily. Qty: 30 tablet, Refills: 6    torsemide (DEMADEX) 20 MG tablet TAKE 2 TABLETS BY MOUTH EVERY MORNING Qty: 60 tablet, Refills: 6    traMADol (ULTRAM) 50 MG tablet Take 1 tablet (50 mg total) by mouth 3 (three) times daily as needed for pain. Qty: 90 tablet, Refills: 1    aspirin 81 MG tablet Take 81 mg by mouth daily as needed for fever.     calcium carbonate (OS-CAL - DOSED IN MG OF ELEMENTAL CALCIUM) 1250 MG tablet Take 2 tablets by mouth daily.    ondansetron (ZOFRAN) 4 MG tablet Take 1 tablet (4 mg total) by mouth every 6 (six) hours. Qty: 12 tablet, Refills: 0    Vitamin D, Ergocalciferol, (DRISDOL) 50000 UNITS CAPS TAKE 1 CAPSULE BY MOUTH EVERY 7 DAYS Qty: 4 capsule, Refills: 6      STOP taking these medications     cephALEXin (KEFLEX) 500 MG capsule      Phenylephrine-Pheniramine-DM (THERAFLU COLD & COUGH PO)        Allergies  Allergen Reactions  . Minoxidil     Causes  palpitations/unable to sleep  . Naproxen     REACTION: swelling      The results of significant diagnostics from this hospitalization (including imaging, microbiology, ancillary and laboratory) are listed below for reference.    Significant Diagnostic Studies: Dg Chest 2 View  04/25/2014   CLINICAL DATA:  Initial encounter for Cough. Congestion. Shortness of breath. Fever and weakness for 3 days.  EXAM: CHEST  2 VIEW  COMPARISON:  03/20/2011  FINDINGS: Midline trachea. Right paratracheal soft tissue fullness is similar to on the prior exam. Moderate cardiomegaly. Tortuous thoracic aorta with atherosclerosis in the transverse segment. No pleural effusion or pneumothorax. No congestive failure. Clear lungs.  IMPRESSION: Cardiomegaly without congestive failure.  Right paratracheal soft tissue fullness, present back to 2008. This is consistent with a benign etiology.  Considerations include right-sided thyroid enlargement, prominent brachiocephalic vein, or less likely a benign lymph node.   Electronically Signed   By: Abigail Miyamoto M.D.   On: 04/25/2014 19:48    Microbiology: Recent Results (from the past 240 hour(s))  Blood culture (routine x 2)     Status: None   Collection Time: 04/25/14  7:35 PM  Result Value Ref Range Status   Specimen Description BLOOD LEFT FOREARM  Final   Special Requests BOTTLES DRAWN AEROBIC AND ANAEROBIC 5CC  Final   Culture   Final    ESCHERICHIA COLI Note: Gram Stain Report Called to,Read Back By and Verified With: Fredric Dine 04/26/14 AT 11:10 AM BY [facility] Performed at [facility]    Report Status 04/28/2014 FINAL  Final   Organism ID, Bacteria ESCHERICHIA COLI  Final      Susceptibility   Escherichia coli - MIC*    AMPICILLIN >=32 RESISTANT Resistant     AMPICILLIN/SULBACTAM >=32 RESISTANT Resistant     CEFAZOLIN <=4 SENSITIVE Sensitive     CEFEPIME <=1 SENSITIVE Sensitive     CEFTAZIDIME <=1 SENSITIVE Sensitive     CEFTRIAXONE <=1 SENSITIVE  Sensitive     CIPROFLOXACIN <=0.25 SENSITIVE Sensitive     GENTAMICIN <=1 SENSITIVE Sensitive     IMIPENEM <=0.25 SENSITIVE Sensitive     PIP/TAZO <=4 SENSITIVE Sensitive     TOBRAMYCIN <=1 SENSITIVE Sensitive     TRIMETH/SULFA >=320 RESISTANT Resistant     * ESCHERICHIA COLI  Urine culture     Status: None   Collection Time: 04/25/14  7:38 PM  Result Value Ref Range Status   Specimen Description URINE, CLEAN CATCH  Final   Special Requests NONE  Final   Colony Count   Final    >=100,000 COLONIES/ML Performed at [facility]    Culture   Final    ESCHERICHIA COLI Performed at [facility]    Report Status 04/28/2014 FINAL  Final   Organism ID, Bacteria ESCHERICHIA COLI  Final      Susceptibility   Escherichia coli - MIC*    AMPICILLIN >=32 RESISTANT Resistant     CEFAZOLIN <=4 SENSITIVE Sensitive     CEFTRIAXONE <=1 SENSITIVE Sensitive     CIPROFLOXACIN 0.5 SENSITIVE Sensitive     GENTAMICIN <=1 SENSITIVE Sensitive     LEVOFLOXACIN 1 SENSITIVE Sensitive     NITROFURANTOIN <=16 SENSITIVE Sensitive     TOBRAMYCIN <=1 SENSITIVE Sensitive     TRIMETH/SULFA >=320 RESISTANT Resistant     PIP/TAZO <=4 SENSITIVE Sensitive     * ESCHERICHIA COLI  Blood culture (routine x 2)     Status: None   Collection Time: 04/25/14  7:43 PM  Result Value Ref Range Status   Specimen Description BLOOD LEFT HAND  Final   Special Requests BOTTLES DRAWN AEROBIC AND ANAEROBIC 5CC  Final   Culture   Final    ESCHERICHIA COLI Note: SUSCEPTIBILITIES PERFORMED ON PREVIOUS CULTURE WITHIN THE LAST 5 DAYS. Note: Gram Stain Report Called to,Read Back By and Verified With: Fredric Dine 04/26/14 AT 11:10 AM BY [facility] Performed at [facility]    Report Status 04/28/2014 FINAL  Final  Culture, blood (routine x 2)     Status: None (Preliminary result)   Collection Time: 04/26/14 11:55 PM  Result Value Ref Range Status   Specimen Description BLOOD LEFT FOREARM  Final   Special  Requests BOTTLES DRAWN AEROBIC ONLY 5ML  Final   Culture  Final           BLOOD CULTURE RECEIVED NO GROWTH TO DATE CULTURE WILL BE HELD FOR 5 DAYS BEFORE ISSUING A FINAL NEGATIVE REPORT Performed at [facility]    Report Status PENDING  Incomplete  Culture, blood (routine x 2)     Status: None (Preliminary result)   Collection Time: 04/27/14 12:01 AM  Result Value Ref Range Status   Specimen Description BLOOD LEFT ARM  Final   Special Requests BOTTLES DRAWN AEROBIC ONLY 5ML  Final   Culture   Final           BLOOD CULTURE RECEIVED NO GROWTH TO DATE CULTURE WILL BE HELD FOR 5 DAYS BEFORE ISSUING A FINAL NEGATIVE REPORT Performed at [facility]    Report Status PENDING  Incomplete  Urine culture     Status: None   Collection Time: 04/27/14  3:39 AM  Result Value Ref Range Status   Specimen Description URINE, RANDOM  Final   Special Requests NONE  Final   Colony Count NO GROWTH Performed at [facility]   Final   Culture NO GROWTH Performed at [facility]   Final   Report Status 04/28/2014 FINAL  Final     Labs: Basic Metabolic Panel:  Recent Labs Lab 04/25/14 1935 04/26/14 2049 04/27/14 0502  NA 134* 138 138  K 4.1 3.7 3.5  CL 101 102 104  CO2 24  --  26  GLUCOSE 148* 101* 101*  BUN 27* 27* 28*  CREATININE 1.78* 1.90* 1.80*  CALCIUM 9.6  --  9.4   Liver Function Tests:  Recent Labs Lab 04/25/14 1935  AST 32  ALT 21  ALKPHOS 87  BILITOT 1.0  PROT 8.0  ALBUMIN 3.4*   No results for input(s): LIPASE, AMYLASE in the last 168 hours. No results for input(s): AMMONIA in the last 168 hours. CBC:  Recent Labs Lab 04/25/14 1935 04/26/14 2041 04/26/14 2049 04/27/14 0502  WBC 14.9* 10.2  --  7.8  NEUTROABS 12.6* 7.5  --   --   HGB 10.3* 9.5* 10.5* 8.6*  HCT 32.4* 30.5* 31.0* 27.1*  MCV 85.0 85.9  --  86.0  PLT 225 187  --  192   Cardiac Enzymes: No results for input(s): CKTOTAL, CKMB, CKMBINDEX, TROPONINI in the last  168 hours. BNP: BNP (last 3 results) No results for input(s): PROBNP in the last 8760 hours. CBG:  Recent Labs Lab 04/27/14 0737 04/28/14 0732 04/29/14 0728 04/30/14 0721 05/01/14 0714  GLUCAP 85 96 94 100* 110*       Signed:  Velvet Bathe  [facility] 05/01/2014, 1:30 PM

## 2014-05-03 LAB — CULTURE, BLOOD (ROUTINE X 2)
Culture: NO GROWTH
Culture: NO GROWTH

## 2014-05-25 ENCOUNTER — Ambulatory Visit (INDEPENDENT_AMBULATORY_CARE_PROVIDER_SITE_OTHER): Payer: Medicare Other | Admitting: Internal Medicine

## 2014-05-25 ENCOUNTER — Other Ambulatory Visit (INDEPENDENT_AMBULATORY_CARE_PROVIDER_SITE_OTHER): Payer: Medicare Other

## 2014-05-25 ENCOUNTER — Encounter: Payer: Self-pay | Admitting: Internal Medicine

## 2014-05-25 VITALS — BP 128/72 | HR 92 | Temp 98.0°F | Resp 18 | Ht 66.0 in | Wt 233.0 lb

## 2014-05-25 DIAGNOSIS — N183 Chronic kidney disease, stage 3 unspecified: Secondary | ICD-10-CM

## 2014-05-25 DIAGNOSIS — I872 Venous insufficiency (chronic) (peripheral): Secondary | ICD-10-CM | POA: Diagnosis not present

## 2014-05-25 DIAGNOSIS — A4150 Gram-negative sepsis, unspecified: Secondary | ICD-10-CM

## 2014-05-25 DIAGNOSIS — Z418 Encounter for other procedures for purposes other than remedying health state: Secondary | ICD-10-CM | POA: Diagnosis not present

## 2014-05-25 DIAGNOSIS — R7881 Bacteremia: Secondary | ICD-10-CM

## 2014-05-25 DIAGNOSIS — Z23 Encounter for immunization: Secondary | ICD-10-CM | POA: Diagnosis not present

## 2014-05-25 DIAGNOSIS — I10 Essential (primary) hypertension: Secondary | ICD-10-CM

## 2014-05-25 DIAGNOSIS — Z299 Encounter for prophylactic measures, unspecified: Secondary | ICD-10-CM

## 2014-05-25 LAB — RENAL FUNCTION PANEL
ALBUMIN: 3.6 g/dL (ref 3.5–5.2)
BUN: 22 mg/dL (ref 6–23)
CHLORIDE: 106 meq/L (ref 96–112)
CO2: 28 mEq/L (ref 19–32)
Calcium: 10.2 mg/dL (ref 8.4–10.5)
Creatinine, Ser: 1.55 mg/dL — ABNORMAL HIGH (ref 0.40–1.20)
GFR: 42.68 mL/min — AB (ref >60.00)
Glucose, Bld: 102 mg/dL — ABNORMAL HIGH (ref 70–99)
PHOSPHORUS: 3.1 mg/dL (ref 2.3–4.6)
POTASSIUM: 3.9 meq/L (ref 3.5–5.1)
Sodium: 140 mEq/L (ref 135–145)

## 2014-05-25 MED ORDER — HYDRALAZINE HCL 50 MG PO TABS: 50.0000 mg | ORAL_TABLET | Freq: Three times a day (TID) | ORAL | Status: DC

## 2014-05-25 MED ORDER — TRAMADOL HCL 50 MG PO TABS: 50.0000 mg | ORAL_TABLET | Freq: Three times a day (TID) | ORAL | Status: DC | PRN

## 2014-05-25 MED ORDER — DICLOFENAC SODIUM 1 % TD GEL: 2.0000 g | Freq: Three times a day (TID) | TRANSDERMAL | Status: DC | PRN

## 2014-05-25 NOTE — Patient Instructions (Signed)
We will check your blood work and forward the results to Dr. Jimmy Footman. We will also check a culture to make sure the bacteria are gone. That will come back in about 3-5 days.   We have given you the tetanus and pneumonia shot. These help to protect you against pneumonia and the whooping cough.   We have refilled the hydralazine and want you to keep taking it 3 times a day.   We have sent in voltaren gel which should help with the knee pain. We have also refilled your tramadol  Come back in about 3-4 months so we can check on the blood pressure. If you have any new problems or questions before then please call the office.

## 2014-05-25 NOTE — Progress Notes (Signed)
Pre visit review using our clinic review tool, if applicable. No additional management support is needed unless otherwise documented below in the visit note. 

## 2014-05-25 NOTE — Progress Notes (Signed)
   Subjective:    Patient ID: [name], female    DOB: 1946-01-26, 69 y.o.   MRN: [id number]  HPI The patient is a 69 YO female who is new here today and coming in for hospital follow up. She was in the hospital with UTI (associated with gram negative bacteremia) and underwent antibiotic treatment. While she was there her BP was very high and medications were adjusted. She does also have CKD stage 3 and sees Dr. Jimmy Footman. Since leaving the hospital she has finished her antibiotics and feels the infection is cleared. She is not having any more symptoms and feels better. No falls since being home and needs refills on some of her medications. She denies problems with her blood pressure since being home such as headache, chest pains, nausea.    PMH, [facility], social history, allergies, medications reviewed and updated.  Review of Systems  Constitutional: Positive for activity change. Negative for fever, chills, appetite change, fatigue and unexpected weight change.  HENT: Negative.   Eyes: Negative.   Respiratory: Negative for cough, chest tightness, shortness of breath and wheezing.   Cardiovascular: Positive for leg swelling. Negative for chest pain and palpitations.  Gastrointestinal: Negative for abdominal pain, diarrhea, constipation and abdominal distention.  Genitourinary: Negative for dysuria, frequency and flank pain.  Musculoskeletal: Positive for arthralgias and gait problem.  Skin: Negative.   Neurological: Negative.   Psychiatric/Behavioral: Negative.       Objective:   Physical Exam  Constitutional: She is oriented to person, place, and time. She appears well-developed and well-nourished.  HENT:  Head: Normocephalic and atraumatic.  Eyes: EOM are normal.  Neck: Normal range of motion.  Cardiovascular: Normal rate and regular rhythm.   Pulmonary/Chest: Effort normal and breath sounds normal.  Abdominal: Soft. Bowel sounds are normal.  Musculoskeletal: She exhibits edema.    Bilateral 1-2+ edema to shins  Neurological: She is alert and oriented to person, place, and time.  Psychiatric: She has a normal mood and affect. Her behavior is normal.   Filed Vitals:   05/25/14 0903  BP: 150/92  Pulse: 92  Temp: 98 F (36.7 C)  TempSrc: Oral  Resp: 18  Height: 5\' 6"  (1.676 m)  Weight: 233 lb (105.688 kg)  SpO2: 96%      Assessment & Plan:  Tdap and pneumonia 23 given at today's visit.

## 2014-05-27 NOTE — Assessment & Plan Note (Signed)
Recheck blood cultures today. She has finished all her antibiotics and is not on them now. Likely due to the UTI. No indication for TEE at this time.

## 2014-05-27 NOTE — Assessment & Plan Note (Signed)
Taking hydralazine TID, amlodipine 10 mg daily, labetalol 400 mg BID, losartan 100 mg daily, spironolactone and torsemide. BP okay on recheck today and will closely monitor given her CKD.

## 2014-05-27 NOTE — Assessment & Plan Note (Signed)
Will route labs from today to Dr. Jimmy Footman. Kidney function appears stable and will work with BP for control to keep good.

## 2014-05-27 NOTE — Assessment & Plan Note (Signed)
Chronic and she normally wears stockings but she did not put them on this morning. No concern for blood clots, no tenderness.

## 2014-05-30 ENCOUNTER — Other Ambulatory Visit: Payer: Medicare Other

## 2014-05-30 DIAGNOSIS — R7881 Bacteremia: Secondary | ICD-10-CM | POA: Diagnosis not present

## 2014-05-30 DIAGNOSIS — A4150 Gram-negative sepsis, unspecified: Secondary | ICD-10-CM | POA: Diagnosis not present

## 2014-05-31 ENCOUNTER — Telehealth: Payer: Self-pay | Admitting: Internal Medicine

## 2014-05-31 NOTE — Telephone Encounter (Signed)
Patient is returning your call, leave a message if not there

## 2014-06-05 LAB — CULTURE, BLOOD (SINGLE): Organism ID, Bacteria: NO GROWTH

## 2014-06-23 DIAGNOSIS — N390 Urinary tract infection, site not specified: Secondary | ICD-10-CM | POA: Diagnosis not present

## 2014-06-23 DIAGNOSIS — N183 Chronic kidney disease, stage 3 (moderate): Secondary | ICD-10-CM | POA: Diagnosis not present

## 2014-06-23 DIAGNOSIS — N2581 Secondary hyperparathyroidism of renal origin: Secondary | ICD-10-CM | POA: Diagnosis not present

## 2014-06-23 DIAGNOSIS — D631 Anemia in chronic kidney disease: Secondary | ICD-10-CM | POA: Diagnosis not present

## 2014-06-23 DIAGNOSIS — I129 Hypertensive chronic kidney disease with stage 1 through stage 4 chronic kidney disease, or unspecified chronic kidney disease: Secondary | ICD-10-CM | POA: Diagnosis not present

## 2014-08-17 DIAGNOSIS — M1712 Unilateral primary osteoarthritis, left knee: Secondary | ICD-10-CM | POA: Diagnosis not present

## 2014-08-17 DIAGNOSIS — N390 Urinary tract infection, site not specified: Secondary | ICD-10-CM | POA: Diagnosis not present

## 2014-08-17 DIAGNOSIS — M1711 Unilateral primary osteoarthritis, right knee: Secondary | ICD-10-CM | POA: Diagnosis not present

## 2014-08-17 DIAGNOSIS — M545 Low back pain: Secondary | ICD-10-CM | POA: Diagnosis not present

## 2014-09-24 ENCOUNTER — Encounter: Payer: Self-pay | Admitting: Family Medicine

## 2014-09-24 ENCOUNTER — Ambulatory Visit (INDEPENDENT_AMBULATORY_CARE_PROVIDER_SITE_OTHER): Payer: Medicare Other | Admitting: Family Medicine

## 2014-09-24 VITALS — BP 136/76 | HR 83 | Temp 98.5°F | Ht 66.0 in | Wt 232.0 lb

## 2014-09-24 DIAGNOSIS — A4150 Gram-negative sepsis, unspecified: Secondary | ICD-10-CM | POA: Diagnosis not present

## 2014-09-24 DIAGNOSIS — R7881 Bacteremia: Secondary | ICD-10-CM

## 2014-09-24 DIAGNOSIS — R300 Dysuria: Secondary | ICD-10-CM | POA: Diagnosis not present

## 2014-09-24 DIAGNOSIS — J069 Acute upper respiratory infection, unspecified: Secondary | ICD-10-CM

## 2014-09-24 LAB — POCT URINALYSIS DIPSTICK
Bilirubin, UA: NEGATIVE
Blood, UA: NEGATIVE
GLUCOSE UA: NEGATIVE
KETONES UA: NEGATIVE
Nitrite, UA: NEGATIVE
Protein, UA: NEGATIVE
Urobilinogen, UA: 0.2
pH, UA: 6

## 2014-09-24 NOTE — Assessment & Plan Note (Signed)
No clear sign of current bacteremia .Marland Kitchen No fever. Will eval urine to make sure given history.

## 2014-09-24 NOTE — Progress Notes (Signed)
Pre visit review using our clinic review tool, if applicable. No additional management support is needed unless otherwise documented below in the visit note. 

## 2014-09-24 NOTE — Patient Instructions (Addendum)
Symptomatic care with mucinex DM to break up mucus, nasal saline irrigation or spray 2-3 times daily and start flonase over the counter 2 sprays per nostril daily. Call if not improving as expected.  We will call with urine culture results.

## 2014-09-24 NOTE — Progress Notes (Signed)
   Subjective:    Patient ID: [name], female    DOB: 02/20/1946, 69 y.o.   MRN: [id number]  HPI   69 year old female pt of Dr. Doug Sou with history of hosp in January 2016 for UTI and bacteremia, CKD and HTN who presents with  new onset 3 days ago of subjective fever, (does not have thermometer)  Slight diarrhea off and on ( none now), and nasal congestion, headache and sinus pressure. Eyes itchy. Pain greater in right face than left. Took tylenol for temperature, last dose yesterday. No fever here. Mild nausea, no vomiting. No abdominal pain.  She had some burning in urination 5/18.. Had UA/culture clear. She has continued to have burning off and on since then. Spunds more like vaginal irritation. No change in urine frequency. No hematuria.     Review of Systems  HENT: Positive for postnasal drip, sinus pressure, sneezing and sore throat. Negative for ear pain.   Respiratory: Positive for cough. Negative for shortness of breath.        Occ slight cough  Cardiovascular: Negative for chest pain.  Gastrointestinal: Negative for abdominal pain.       Objective:   Physical Exam  Constitutional: Vital signs are normal. She appears well-developed and well-nourished. She is cooperative.  Non-toxic appearance. She does not appear ill. No distress.  HENT:  Head: Normocephalic.  Right Ear: Hearing, tympanic membrane, external ear and ear canal normal. Tympanic membrane is not erythematous, not retracted and not bulging.  Left Ear: Hearing, tympanic membrane, external ear and ear canal normal. Tympanic membrane is not erythematous, not retracted and not bulging.  Nose: Mucosal edema and rhinorrhea present. Right sinus exhibits maxillary sinus tenderness. Right sinus exhibits no frontal sinus tenderness. Left sinus exhibits no maxillary sinus tenderness and no frontal sinus tenderness.  Mouth/Throat: Uvula is midline, oropharynx is clear and moist and mucous membranes are normal.  ttp over  ethmoid sinus on right  Eyes: Conjunctivae, EOM and lids are normal. Pupils are equal, round, and reactive to light. Lids are everted and swept, no foreign bodies found.  Neck: Trachea normal and normal range of motion. Neck supple. Carotid bruit is not present. No thyroid mass and no thyromegaly present.  Cardiovascular: Normal rate, regular rhythm, S1 normal, S2 normal, normal heart sounds, intact distal pulses and normal pulses.  Exam reveals no gallop and no friction rub.   No murmur heard. Pulmonary/Chest: Effort normal and breath sounds normal. No tachypnea. No respiratory distress. She has no decreased breath sounds. She has no wheezes. She has no rhonchi. She has no rales.  Abdominal: Normal appearance and bowel sounds are normal. She exhibits no fluid wave, no ascites and no mass. There is no hepatosplenomegaly. There is no tenderness.  Neurological: She is alert.  Skin: Skin is warm, dry and intact. No rash noted.  Psychiatric: Her speech is normal and behavior is normal. Judgment normal. Her mood appears not anxious. Cognition and memory are normal. She does not exhibit a depressed mood.          Assessment & Plan:

## 2014-09-24 NOTE — Assessment & Plan Note (Signed)
No sign of bacterial infection.  Symptomatic care with mucinex DM to break up mucus, nasal saline irrigation or spray 2-3 times daily and start flonase over the counter 2 sprays per nostril daily.

## 2014-09-27 ENCOUNTER — Ambulatory Visit: Payer: Medicare Other | Admitting: Internal Medicine

## 2014-09-30 ENCOUNTER — Telehealth: Payer: Self-pay | Admitting: *Deleted

## 2014-09-30 NOTE — Assessment & Plan Note (Addendum)
No clear UTI,  UA show leuks but no micro available to rule out contamination. I will send for culture given recent history of bacteremia/urosepsis.

## 2014-09-30 NOTE — Telephone Encounter (Signed)
Called pt to find out how she was doing.  No answer. Left message on machine for pt to contact PCP if urinary symptoms continuing or to consider being seen in Saturday clinic if still not feeling well.

## 2014-09-30 NOTE — Telephone Encounter (Signed)
Called [facility] to find out status of UCx. They advised that any UCx that are sent over on Saturdays are sent to [place] to find out status and they advised they had not received the sample. Unknown if culture was ever taken to [facility] or got lost.

## 2014-10-07 ENCOUNTER — Telehealth: Payer: Self-pay | Admitting: Internal Medicine

## 2014-10-07 NOTE — Telephone Encounter (Signed)
Called patient to see if a recent mammogram has been done left a voicemail.

## 2014-10-18 ENCOUNTER — Encounter: Payer: Self-pay | Admitting: Internal Medicine

## 2014-10-18 ENCOUNTER — Ambulatory Visit (INDEPENDENT_AMBULATORY_CARE_PROVIDER_SITE_OTHER): Payer: Medicare Other | Admitting: Internal Medicine

## 2014-10-18 ENCOUNTER — Other Ambulatory Visit (INDEPENDENT_AMBULATORY_CARE_PROVIDER_SITE_OTHER): Payer: Medicare Other

## 2014-10-18 VITALS — BP 180/80 | HR 91 | Temp 98.4°F | Resp 16 | Ht 66.0 in | Wt 232.4 lb

## 2014-10-18 DIAGNOSIS — N183 Chronic kidney disease, stage 3 (moderate): Secondary | ICD-10-CM | POA: Diagnosis not present

## 2014-10-18 DIAGNOSIS — E559 Vitamin D deficiency, unspecified: Secondary | ICD-10-CM | POA: Diagnosis not present

## 2014-10-18 DIAGNOSIS — J069 Acute upper respiratory infection, unspecified: Secondary | ICD-10-CM | POA: Diagnosis not present

## 2014-10-18 DIAGNOSIS — Z79899 Other long term (current) drug therapy: Secondary | ICD-10-CM

## 2014-10-18 DIAGNOSIS — I10 Essential (primary) hypertension: Secondary | ICD-10-CM | POA: Diagnosis not present

## 2014-10-18 DIAGNOSIS — R5383 Other fatigue: Secondary | ICD-10-CM | POA: Diagnosis not present

## 2014-10-18 LAB — URINALYSIS, ROUTINE W REFLEX MICROSCOPIC
BILIRUBIN URINE: NEGATIVE
Hgb urine dipstick: NEGATIVE
KETONES UR: NEGATIVE
Nitrite: NEGATIVE
RBC / HPF: NONE SEEN (ref 0–?)
Specific Gravity, Urine: 1.02 (ref 1.000–1.030)
TOTAL PROTEIN, URINE-UPE24: 30 — AB
URINE GLUCOSE: NEGATIVE
Urobilinogen, UA: 0.2 (ref 0.0–1.0)
pH: 6 (ref 5.0–8.0)

## 2014-10-18 LAB — RENAL FUNCTION PANEL
ALBUMIN: 3.6 g/dL (ref 3.5–5.2)
BUN: 25 mg/dL — AB (ref 6–23)
CHLORIDE: 106 meq/L (ref 96–112)
CO2: 32 mEq/L (ref 19–32)
CREATININE: 1.59 mg/dL — AB (ref 0.40–1.20)
Calcium: 9.6 mg/dL (ref 8.4–10.5)
GFR: 41.4 mL/min — ABNORMAL LOW (ref >60.00)
GLUCOSE: 93 mg/dL (ref 70–99)
POTASSIUM: 3.3 meq/L — AB (ref 3.5–5.1)
Phosphorus: 2.2 mg/dL — ABNORMAL LOW (ref 2.3–4.6)
SODIUM: 144 meq/L (ref 135–145)

## 2014-10-18 LAB — VITAMIN D 25 HYDROXY (VIT D DEFICIENCY, FRACTURES): VITD: 18.21 ng/mL — ABNORMAL LOW (ref 30.00–100.00)

## 2014-10-18 LAB — TSH: TSH: 1.17 u[IU]/mL (ref 0.35–4.50)

## 2014-10-18 LAB — VITAMIN B12: Vitamin B-12: 338 pg/mL (ref 211–911)

## 2014-10-18 NOTE — Assessment & Plan Note (Signed)
Overall improving, advised to use flonase daily for the next several weeks. No indication for antibiotics at this time.

## 2014-10-18 NOTE — Progress Notes (Signed)
Pre visit review using our clinic review tool, if applicable. No additional management support is needed unless otherwise documented below in the visit note. 

## 2014-10-18 NOTE — Patient Instructions (Signed)
We will check your labs today and call you back with the results. We will also send them to your kidney doctor and they will have them by next week.   We are not changing your medicines today.   Work on being more active in whatever ways you are able as this helps with arthritis pain overall.

## 2014-10-18 NOTE — Assessment & Plan Note (Addendum)
BP initially elevated, recheck not much different, has visit with nephrology next week. Continue amlodipine, losartan, labetalol, hydralazine, torsemide, spironolactone for now.

## 2014-10-18 NOTE — Progress Notes (Signed)
   Subjective:    Patient ID: [name], female    DOB: March 27, 1946, 69 y.o.   MRN: [id number]  HPI The patient is a 69 YO female who is coming in for head congestion. She was treated for a uri back several weeks ago and is still feeling a little congested. She is having nose drainage. No facial tenderness. No fevers or chills. Was given flonase but not taking regularly, thinks that it helps when she uses it. Not taking any OTC cold medicines. No ear drainage or hearing changes.   Review of Systems  Constitutional: Positive for activity change. Negative for fever, chills, appetite change, fatigue and unexpected weight change.  HENT: Positive for congestion, postnasal drip and rhinorrhea. Negative for ear discharge, ear pain, sinus pressure, sore throat and trouble swallowing.   Respiratory: Negative for cough, chest tightness, shortness of breath and wheezing.   Cardiovascular: Positive for leg swelling. Negative for chest pain and palpitations.  Gastrointestinal: Negative for abdominal pain, diarrhea, constipation and abdominal distention.  Genitourinary: Negative for dysuria, frequency and flank pain.  Musculoskeletal: Positive for arthralgias and gait problem.  Neurological: Negative.       Objective:   Physical Exam  Constitutional: She is oriented to person, place, and time. She appears well-developed and well-nourished.  HENT:  Head: Normocephalic and atraumatic.  Right Ear: External ear normal.  Left Ear: External ear normal.  Oropharynx with mild clear drainage, nasal turbinates with some redness and swelling.   Eyes: EOM are normal.  Neck: Normal range of motion.  Cardiovascular: Normal rate and regular rhythm.   Pulmonary/Chest: Effort normal and breath sounds normal.  Abdominal: Soft. Bowel sounds are normal.  Neurological: She is alert and oriented to person, place, and time.  Psychiatric: She has a normal mood and affect. Her behavior is normal.   Filed Vitals:   10/18/14 1039  BP: 180/80  Pulse: 91  Temp: 98.4 F (36.9 C)  TempSrc: Oral  Resp: 16  Height: 5\' 6"  (1.676 m)  Weight: 232 lb 6.4 oz (105.416 kg)  SpO2: 95%      Assessment & Plan:

## 2014-10-25 DIAGNOSIS — I129 Hypertensive chronic kidney disease with stage 1 through stage 4 chronic kidney disease, or unspecified chronic kidney disease: Secondary | ICD-10-CM | POA: Diagnosis not present

## 2014-10-25 DIAGNOSIS — N183 Chronic kidney disease, stage 3 (moderate): Secondary | ICD-10-CM | POA: Diagnosis not present

## 2014-10-25 DIAGNOSIS — N189 Chronic kidney disease, unspecified: Secondary | ICD-10-CM | POA: Diagnosis not present

## 2014-10-25 DIAGNOSIS — N2581 Secondary hyperparathyroidism of renal origin: Secondary | ICD-10-CM | POA: Diagnosis not present

## 2014-10-25 DIAGNOSIS — D631 Anemia in chronic kidney disease: Secondary | ICD-10-CM | POA: Diagnosis not present

## 2014-10-31 ENCOUNTER — Telehealth: Payer: Self-pay | Admitting: Internal Medicine

## 2014-12-07 DIAGNOSIS — N183 Chronic kidney disease, stage 3 (moderate): Secondary | ICD-10-CM | POA: Diagnosis not present

## 2014-12-07 DIAGNOSIS — N2581 Secondary hyperparathyroidism of renal origin: Secondary | ICD-10-CM | POA: Diagnosis not present

## 2014-12-16 ENCOUNTER — Encounter: Payer: Self-pay | Admitting: Internal Medicine

## 2014-12-21 DIAGNOSIS — M1712 Unilateral primary osteoarthritis, left knee: Secondary | ICD-10-CM | POA: Diagnosis not present

## 2014-12-21 DIAGNOSIS — M1711 Unilateral primary osteoarthritis, right knee: Secondary | ICD-10-CM | POA: Diagnosis not present

## 2015-02-20 ENCOUNTER — Encounter: Payer: Self-pay | Admitting: Gastroenterology

## 2015-04-27 ENCOUNTER — Encounter: Payer: Self-pay | Admitting: Internal Medicine

## 2015-04-27 ENCOUNTER — Ambulatory Visit (INDEPENDENT_AMBULATORY_CARE_PROVIDER_SITE_OTHER): Payer: Medicare Other | Admitting: Internal Medicine

## 2015-04-27 ENCOUNTER — Other Ambulatory Visit: Payer: Medicare Other

## 2015-04-27 VITALS — BP 142/62 | HR 87 | Temp 98.3°F | Resp 18 | Ht 66.0 in | Wt 234.0 lb

## 2015-04-27 DIAGNOSIS — M5441 Lumbago with sciatica, right side: Secondary | ICD-10-CM

## 2015-04-27 DIAGNOSIS — R300 Dysuria: Secondary | ICD-10-CM

## 2015-04-27 DIAGNOSIS — G8929 Other chronic pain: Secondary | ICD-10-CM | POA: Diagnosis not present

## 2015-04-27 DIAGNOSIS — I10 Essential (primary) hypertension: Secondary | ICD-10-CM

## 2015-04-27 LAB — POCT URINALYSIS DIPSTICK
BILIRUBIN UA: NEGATIVE
Blood, UA: NEGATIVE
Glucose, UA: NEGATIVE
Ketones, UA: NEGATIVE
LEUKOCYTES UA: NEGATIVE
Nitrite, UA: NEGATIVE
Protein, UA: POSITIVE
Spec Grav, UA: 1.025
Urobilinogen, UA: NEGATIVE
pH, UA: 6

## 2015-04-27 MED ORDER — CLOBETASOL PROPIONATE 0.05 % EX OINT: 1.0000 "application " | TOPICAL_OINTMENT | Freq: Every day | CUTANEOUS | Status: DC | PRN

## 2015-04-27 MED ORDER — FLUCONAZOLE 150 MG PO TABS: 150.0000 mg | ORAL_TABLET | Freq: Once | ORAL | Status: DC

## 2015-04-27 NOTE — Progress Notes (Signed)
Pre visit review using our clinic review tool, if applicable. No additional management support is needed unless otherwise documented below in the visit note. 

## 2015-04-27 NOTE — Assessment & Plan Note (Signed)
BP slightly elevated today which is not usual. She has not taken her meds today and reminded her of the need to take her meds daily and around the same time. We have talked before about ways to remember to take her medicines. Getting carotid doppler to rule out carotid stenosis given her long standing and at times not well controlled blood pressure.

## 2015-04-27 NOTE — Progress Notes (Signed)
   Subjective:    Patient ID: [name], female    DOB: June 19, 1945, 70 y.o.   MRN: [id number]  HPI The patient is a 71 YO female coming in for multiple acute concerns. She is having some burning with urination off and on for several months. She has been having it for the last several days. She is not having fevers or chills. No color or odor change. Is having some mild vaginal white discharge. Next concern is her neck, she has been having some tingling and pain at the back of her neck. She has had pinched nerves in the low back before which felt similar. She is concerned with her blood pressure that it could be the blood flow in her neck. She denies weakness, numbness in her hands or legs. No speech changes.   Review of Systems  Constitutional: Positive for activity change. Negative for fever, chills, appetite change, fatigue and unexpected weight change.  HENT: Negative.   Eyes: Negative.   Respiratory: Negative for cough, chest tightness, shortness of breath and wheezing.   Cardiovascular: Negative for chest pain, palpitations and leg swelling.  Gastrointestinal: Negative for abdominal pain, diarrhea, constipation and abdominal distention.  Genitourinary: Positive for dysuria and urgency. Negative for frequency and flank pain.  Musculoskeletal: Positive for arthralgias and gait problem.  Skin: Negative.   Neurological: Negative.        Tingling in her neck left side  Psychiatric/Behavioral: Negative.       Objective:   Physical Exam  Constitutional: She is oriented to person, place, and time. She appears well-developed and well-nourished.  HENT:  Head: Normocephalic and atraumatic.  Eyes: EOM are normal.  Neck: Normal range of motion.  Cardiovascular: Normal rate and regular rhythm.   Pulmonary/Chest: Effort normal and breath sounds normal.  Abdominal: Soft. Bowel sounds are normal.  Neurological: She is alert and oriented to person, place, and time.  Psychiatric: She has a  normal mood and affect. Her behavior is normal.   Filed Vitals:   04/27/15 0804 04/27/15 0843  BP: 170/78 142/62  Pulse: 87   Temp: 98.3 F (36.8 C)   TempSrc: Oral   Resp: 18   Height: 5\' 6"  (1.676 m)   Weight: 234 lb (106.142 kg)   SpO2: 95%       Assessment & Plan:

## 2015-04-27 NOTE — Assessment & Plan Note (Signed)
U/A without signs of infection, will treat the discharge with fluconazole for presumed yeast infection.

## 2015-04-27 NOTE — Assessment & Plan Note (Signed)
She was advised to return to her back specialist to look at her neck as well as her low back which is hurting her more lately.

## 2015-04-27 NOTE — Patient Instructions (Signed)
We will get the ultrasound of the neck to make sure the blood vessels are clear.   We have sent in a pill called diflucan that you take for the itching. We do not think you have a urine infection.   You can take vitamin B12 for energy.

## 2015-04-28 ENCOUNTER — Ambulatory Visit (HOSPITAL_COMMUNITY)
Admission: RE | Admit: 2015-04-28 | Discharge: 2015-04-28 | Disposition: A | Discharge status: Home / Self Care | Payer: Medicare Other | Source: Ambulatory Visit | Attending: Cardiovascular Disease | Admitting: Cardiovascular Disease

## 2015-04-28 DIAGNOSIS — I10 Essential (primary) hypertension: Secondary | ICD-10-CM | POA: Insufficient documentation

## 2015-04-28 DIAGNOSIS — I6523 Occlusion and stenosis of bilateral carotid arteries: Secondary | ICD-10-CM | POA: Diagnosis not present

## 2015-04-28 DIAGNOSIS — R209 Unspecified disturbances of skin sensation: Secondary | ICD-10-CM | POA: Diagnosis not present

## 2015-04-30 LAB — CULTURE, URINE COMPREHENSIVE

## 2015-05-01 DIAGNOSIS — I10 Essential (primary) hypertension: Secondary | ICD-10-CM | POA: Diagnosis not present

## 2015-05-03 ENCOUNTER — Other Ambulatory Visit: Payer: Self-pay | Admitting: Neurosurgery

## 2015-05-03 ENCOUNTER — Other Ambulatory Visit: Payer: Self-pay | Admitting: Internal Medicine

## 2015-05-03 DIAGNOSIS — M545 Low back pain, unspecified: Secondary | ICD-10-CM

## 2015-05-03 DIAGNOSIS — M5021 Other cervical disc displacement,  high cervical region: Secondary | ICD-10-CM

## 2015-05-03 DIAGNOSIS — G8929 Other chronic pain: Secondary | ICD-10-CM

## 2015-05-10 NOTE — Discharge Instructions (Signed)
Myelogram Discharge Instructions  1. Go home and rest quietly for the next 24 hours.  It is important to lie flat for the next 24 hours.  Get up only to go to the restroom.  You may lie in the bed or on a couch on your back, your stomach, your left side or your right side.  You may have one pillow under your head.  You may have pillows between your knees while you are on your side or under your knees while you are on your back.  2. DO NOT drive today.  Recline the seat as far back as it will go, while still wearing your seat belt, on the way home.  3. You may get up to go to the bathroom as needed.  You may sit up for 10 minutes to eat.  You may resume your normal diet and medications unless otherwise indicated.  Drink lots of extra fluids today and tomorrow.  4. The incidence of headache, nausea, or vomiting is about 5% (one in 20 patients).  If you develop a headache, lie flat and drink plenty of fluids until the headache goes away.  Caffeinated beverages may be helpful.  If you develop severe nausea and vomiting or a headache that does not go away with flat bed rest, call [phone].  5. You may resume normal activities after your 24 hours of bed rest is over; however, do not exert yourself strongly or do any heavy lifting tomorrow. If when you get up you have a headache when standing, go back to bed and force fluids for another 24 hours.  6. Call your physician for a follow-up appointment.  The results of your myelogram will be sent directly to your physician by the following day.  7. If you have any questions or if complications develop after you arrive home, please call [phone].  Discharge instructions have been explained to the patient.  The patient, or the person responsible for the patient, fully understands these instructions.       MAY RESUME TRAMADOL ON FEB. 3, 2017, AFTER 11:00 AM.

## 2015-05-11 ENCOUNTER — Ambulatory Visit
Admission: RE | Admit: 2015-05-11 | Discharge: 2015-05-11 | Disposition: A | Discharge status: Home / Self Care | Payer: Medicare Other | Source: Ambulatory Visit | Attending: Neurosurgery | Admitting: Neurosurgery

## 2015-05-11 DIAGNOSIS — M545 Low back pain: Secondary | ICD-10-CM

## 2015-05-11 DIAGNOSIS — M4802 Spinal stenosis, cervical region: Secondary | ICD-10-CM | POA: Diagnosis not present

## 2015-05-11 DIAGNOSIS — G8929 Other chronic pain: Secondary | ICD-10-CM

## 2015-05-11 DIAGNOSIS — M5021 Other cervical disc displacement,  high cervical region: Secondary | ICD-10-CM

## 2015-05-11 DIAGNOSIS — M5126 Other intervertebral disc displacement, lumbar region: Secondary | ICD-10-CM | POA: Diagnosis not present

## 2015-05-11 MED ORDER — IOHEXOL 300 MG/ML  SOLN
10.0000 mL | Freq: Once | INTRAMUSCULAR | Status: AC | PRN
  Administered 2015-05-11: 10 mL via INTRATHECAL

## 2015-05-11 MED ORDER — DIAZEPAM 5 MG PO TABS
5.0000 mg | ORAL_TABLET | Freq: Once | ORAL | Status: AC
  Administered 2015-05-11: 5 mg via ORAL

## 2015-05-11 MED ORDER — MEPERIDINE HCL 100 MG/ML IJ SOLN
75.0000 mg | Freq: Once | INTRAMUSCULAR | Status: AC
  Administered 2015-05-11: 75 mg via INTRAMUSCULAR

## 2015-05-11 MED ORDER — ONDANSETRON HCL 4 MG/2ML IJ SOLN
4.0000 mg | Freq: Once | INTRAMUSCULAR | Status: AC
  Administered 2015-05-11: 4 mg via INTRAMUSCULAR

## 2015-05-18 DIAGNOSIS — I10 Essential (primary) hypertension: Secondary | ICD-10-CM | POA: Diagnosis not present

## 2015-05-18 DIAGNOSIS — M5137 Other intervertebral disc degeneration, lumbosacral region: Secondary | ICD-10-CM | POA: Diagnosis not present

## 2015-05-23 ENCOUNTER — Other Ambulatory Visit: Payer: Self-pay | Admitting: Neurosurgery

## 2015-06-16 DIAGNOSIS — D631 Anemia in chronic kidney disease: Secondary | ICD-10-CM | POA: Diagnosis not present

## 2015-06-16 DIAGNOSIS — N189 Chronic kidney disease, unspecified: Secondary | ICD-10-CM | POA: Diagnosis not present

## 2015-06-16 DIAGNOSIS — I129 Hypertensive chronic kidney disease with stage 1 through stage 4 chronic kidney disease, or unspecified chronic kidney disease: Secondary | ICD-10-CM | POA: Diagnosis not present

## 2015-06-16 DIAGNOSIS — N183 Chronic kidney disease, stage 3 (moderate): Secondary | ICD-10-CM | POA: Diagnosis not present

## 2015-06-16 DIAGNOSIS — N2581 Secondary hyperparathyroidism of renal origin: Secondary | ICD-10-CM | POA: Diagnosis not present

## 2015-06-16 DIAGNOSIS — Z23 Encounter for immunization: Secondary | ICD-10-CM | POA: Diagnosis not present

## 2015-06-26 NOTE — Pre-Procedure Instructions (Signed)
[name]  06/26/2015      [id number] - [place], [name] [address] [place] [address] Phone: [phone] Fax: [phone]    Your procedure is scheduled on  March 28th, Tuesday   Report to [facility] Admitting at 6:00 am            (posted surgery time 8:15 am - 10:26 am)   Call this number if you have problems the morning of surgery:  [phone]   Remember:  Do not eat food or drink liquids after midnight Monday.   Take these medicines the morning of surgery with A SIP OF WATER : Amlodipine (Norvasc), Neurontin (Gabapentin), Hydralazine (Apresoline), Labetalol (Normodyne), Tramadol (Ultram)              4-5 days prior to surgery, STOP taking any vitamins, herbal medications, anti-inflammatories, blood thinners.                            Do not wear jewelry, make-up or nail polish.  Do not wear lotions, powders, or perfumes.     Do not shave 48 hours prior to surgery.    Do not bring valuables to the hospital.  [facility] is not responsible for any belongings or valuables.  Contacts, dentures or bridgework may not be worn into surgery.  Leave your suitcase in the car.  After surgery it may be brought to your room. For patients admitted to the hospital, discharge time will be determined by your treatment team.   Please read over the following fact sheets that you were given. Pain Booklet, Coughing and Deep Breathing, MRSA Information and Surgical Site Infection Prevention

## 2015-06-27 ENCOUNTER — Encounter (HOSPITAL_COMMUNITY): Payer: Self-pay

## 2015-06-27 ENCOUNTER — Encounter (HOSPITAL_COMMUNITY)
Admission: RE | Admit: 2015-06-27 | Discharge: 2015-06-27 | Disposition: A | Discharge status: Home / Self Care | Payer: Medicare Other | Source: Ambulatory Visit | Attending: Neurosurgery | Admitting: Neurosurgery

## 2015-06-27 DIAGNOSIS — I872 Venous insufficiency (chronic) (peripheral): Secondary | ICD-10-CM | POA: Insufficient documentation

## 2015-06-27 DIAGNOSIS — I129 Hypertensive chronic kidney disease with stage 1 through stage 4 chronic kidney disease, or unspecified chronic kidney disease: Secondary | ICD-10-CM | POA: Insufficient documentation

## 2015-06-27 DIAGNOSIS — D509 Iron deficiency anemia, unspecified: Secondary | ICD-10-CM | POA: Diagnosis not present

## 2015-06-27 DIAGNOSIS — Z01812 Encounter for preprocedural laboratory examination: Secondary | ICD-10-CM | POA: Diagnosis not present

## 2015-06-27 DIAGNOSIS — Z87891 Personal history of nicotine dependence: Secondary | ICD-10-CM | POA: Insufficient documentation

## 2015-06-27 DIAGNOSIS — Z01818 Encounter for other preprocedural examination: Secondary | ICD-10-CM | POA: Diagnosis not present

## 2015-06-27 DIAGNOSIS — Z79899 Other long term (current) drug therapy: Secondary | ICD-10-CM | POA: Diagnosis not present

## 2015-06-27 DIAGNOSIS — N183 Chronic kidney disease, stage 3 (moderate): Secondary | ICD-10-CM | POA: Insufficient documentation

## 2015-06-27 DIAGNOSIS — I447 Left bundle-branch block, unspecified: Secondary | ICD-10-CM | POA: Diagnosis not present

## 2015-06-27 HISTORY — DX: Chronic kidney disease, stage 3 (moderate): N18.3

## 2015-06-27 HISTORY — DX: Chronic kidney disease, stage 3 unspecified: N18.30

## 2015-06-27 HISTORY — DX: Nausea with vomiting, unspecified: R11.2

## 2015-06-27 HISTORY — DX: Other specified postprocedural states: Z98.890

## 2015-06-27 LAB — CBC
HEMATOCRIT: 30.7 % — AB (ref 36.0–46.0)
Hemoglobin: 9.4 g/dL — ABNORMAL LOW (ref 12.0–15.0)
MCH: 26.5 pg (ref 26.0–34.0)
MCHC: 30.6 g/dL (ref 30.0–36.0)
MCV: 86.5 fL (ref 78.0–100.0)
PLATELETS: 225 10*3/uL (ref 150–400)
RBC: 3.55 MIL/uL — ABNORMAL LOW (ref 3.87–5.11)
RDW: 16.4 % — AB (ref 11.5–15.5)
WBC: 6.6 10*3/uL (ref 4.0–10.5)

## 2015-06-27 LAB — BASIC METABOLIC PANEL
Anion gap: 10 (ref 5–15)
BUN: 50 mg/dL — AB (ref 6–20)
CHLORIDE: 108 mmol/L (ref 101–111)
CO2: 26 mmol/L (ref 22–32)
CREATININE: 2.57 mg/dL — AB (ref 0.44–1.00)
Calcium: 10.2 mg/dL (ref 8.9–10.3)
GFR calc Af Amer: 21 mL/min — ABNORMAL LOW (ref >60)
GFR calc non Af Amer: 18 mL/min — ABNORMAL LOW (ref >60)
GLUCOSE: 104 mg/dL — AB (ref 65–99)
POTASSIUM: 5 mmol/L (ref 3.5–5.1)
Sodium: 144 mmol/L (ref 135–145)

## 2015-06-27 LAB — SURGICAL PCR SCREEN
MRSA, PCR: NEGATIVE
Staphylococcus aureus: NEGATIVE

## 2015-06-27 NOTE — Progress Notes (Addendum)
PCP - Dr. Pricilla Holm Cardiologist - denies Nephrologist - Dr. Jimmy Footman  EKG - 06/27/15 CXR - denies Echo- 2013 Stress test/Cardiac Cath - denies  Patient denies chest pain and acute shortness of breath at PAT appointment.  Patient states that she does become short of breath with exertion but this is a not a new symptom.

## 2015-06-28 ENCOUNTER — Encounter (HOSPITAL_COMMUNITY): Payer: Self-pay

## 2015-06-28 ENCOUNTER — Encounter (HOSPITAL_COMMUNITY): Payer: Self-pay | Admitting: Emergency Medicine

## 2015-06-28 ENCOUNTER — Telehealth (HOSPITAL_COMMUNITY): Payer: Self-pay | Admitting: Vascular Surgery

## 2015-06-28 NOTE — Progress Notes (Signed)
Anesthesia Chart Review:  Pt is a 70 year old female scheduled for L4-5, L5-S1 laminectomy/foraminotomy on 07/04/2015 with Dr. Joya Salm.   PCP is Dr. Pricilla Holm. Nephrologist is Dr. Jimmy Footman.   PMH includes:  HTN, anemia, venous insufficiency, CKD (stage 3), post-op N/V. Former smoker. BMI 39.   Medications include: amlodipine, hydralazine, labetolol, losartan, potassium, spironolactone, torsemide.   Preoperative labs reviewed.   - Cr 2.57, BUN 50. Notes from Dr. Deterding's office dated 10/2014 indicate Cr was 1.7 at that time.  - H/H 9.4/30.7. This is consistent with previous results.   EKG 06/27/15: NSR. LBBB. No old tracing available for comparison.   Carotid duplex 04/28/15:  - 40-59% RICA stenosis, mid and distal RICA is very tortuous. - 123456 LICA stenosis.  Echo 03/11/12:  - Left ventricle: The cavity size was normal. Wall thickness was increased in a pattern of moderate LVH. Systolicfunction was vigorous. The estimated ejection fraction wasin the range of 65% to 70%. Wall motion was normal; there were no regional wall motion abnormalities. Dopplerparameters are consistent with abnormal left ventricularrelaxation (grade 1 diastolic dysfunction). - Aortic valve: Mild regurgitation. - Mitral valve: Mild regurgitation. - Left atrium: The atrium was moderately dilated.  I have faxed lab results to Dr. Jimmy Footman for review/guidance on renal function.   Reviewed case with Dr. Glennon Mac. Pt will need cardiac eval prior to surgery. Left voicemail for Janett Billow in Dr. Harley Hallmark office.   Willeen Cass, FNP-BC [facility] Short Stay Surgical Center/Anesthesiology Phone: [phone] 06/28/2015 12:19 PM

## 2015-06-28 NOTE — Progress Notes (Signed)
Anesthesia follow-up: See notation by Willeen Cass, FNP-BC. Surgery for 07/04/15 is being postponed. Patient will see Dr. Percival Spanish on 07/07/15 for preoperative cardiology evaluation. In regards to her worsening CKD, nephrologist reviewed her PAT labs. With Cr 1.5-->2.5, nephrology understandably would not clear patient from their standpoint. Recommendations included avoid NSAIDS, decrease losartan to 50 mg BID and recheck BP and renal panel next week. I spoke with nursing staff at [facility]. They will get in touch with patient to review recommendations and schedule nephrology follow-up. I also updated Jessica at Dr. Harley Hallmark office. She will plan to get nephrology and cardiology clearances prior to re-booking surgery.    George Hugh [facility] Short Stay Center/Anesthesiology Phone [phone] 06/28/2015 2:55 PM

## 2015-07-04 ENCOUNTER — Ambulatory Visit (HOSPITAL_COMMUNITY): Admission: RE | Admit: 2015-07-04 | Payer: Medicare Other | Source: Ambulatory Visit | Admitting: Neurosurgery

## 2015-07-04 ENCOUNTER — Encounter (HOSPITAL_COMMUNITY): Admission: RE | Payer: Self-pay | Source: Ambulatory Visit

## 2015-07-04 ENCOUNTER — Telehealth: Payer: Self-pay | Admitting: Cardiology

## 2015-07-04 SURGERY — LUMBAR LAMINECTOMY/DECOMPRESSION MICRODISCECTOMY 2 LEVELS
Anesthesia: General

## 2015-07-04 NOTE — Telephone Encounter (Signed)
Received records from [facility] for appointment on 07/07/15 with Dr Percival Spanish.  Records given to [facility] (medical records) for Dr Hochrein's schedule on 07/07/15. lp

## 2015-07-06 NOTE — Progress Notes (Signed)
Cardiology Office Note   Date:  07/07/2015   ID:  [name], DOB Mar 25, 1946, MRN [id number]  PCP:  Hoyt Koch, MD  Cardiologist:   Minus Breeding, MD   Chief Complaint  Patient presents with  . Pre-op Exam  . Shortness of Breath      History of Present Illness: [name] is a 70 y.o. female who presents for evaluation of an abnormal EKG. She did have an echocardiogram in 2014 which was essentially normal. Interestingly I don't find an EKG in our system. She's had long-standing hypertension which she says is reasonably controlled. However, it seems to have caused some renal insufficiency which is followed. She is limited in her activities because of back and knee pain. She is to have a lumbar laminectomy surgery was put on hold given her abnormal EKG. She does get shortness of breath with activities such as walking a short distance on level ground. She thinks this is related to weight, deconditioning and the pain she has in her back and knees. She does not have chest pressure, neck or arm discomfort. She does not have PND or orthopnea. She's not describing palpitations, presyncope or syncope. She has had mild chronic edema.  Past Medical History  Diagnosis Date  . Hypertension   . Venous insufficiency   . Obesity   . Diverticulosis of colon   . Adenomatous colon polyp 01/2002  . Hemorrhoids   . Renal cyst   . DJD (degenerative joint disease)   . Lumbar back pain   . Vitamin D deficiency   . Neuropathy ([place])   . Anemia   . Hemorrhoids   . PONV (postoperative nausea and vomiting)   . Shortness of breath dyspnea     with exertion  . Urinary tract infection   . CKD (chronic kidney disease), stage III     Past Surgical History  Procedure Laterality Date  . Abdominal hysterectomy    . Colonoscopy       Current Outpatient Prescriptions  Medication Sig Dispense Refill  . amLODipine (NORVASC) 10 MG tablet Take 1 tablet (10 mg total) by mouth daily. 30  tablet 0  . calcitRIOL (ROCALTROL) 0.25 MCG capsule Take 0.5 mcg by mouth daily.  0  . clobetasol ointment (TEMOVATE) [id number] % Apply 1 application topically daily as needed (itching). Reported on 04/27/2015 30 g 1  . diclofenac sodium (VOLTAREN) 1 % GEL Apply 2 g topically 3 (three) times daily as needed. 100 g 1  . fluconazole (DIFLUCAN) 150 MG tablet Take 1 tablet (150 mg total) by mouth once. 1 tablet 0  . gabapentin (NEURONTIN) 300 MG capsule Take 300 mg by mouth at bedtime.   0  . hydrALAZINE (APRESOLINE) 50 MG tablet TAKE ONE TABLET BY MOUTH THREE TIMES DAILY 270 tablet 4  . hydrocortisone (ANUSOL-HC) 2.5 % rectal cream Place 1 application rectally as needed. (Patient taking differently: Place 1 application rectally as needed for hemorrhoids. ) 30 g 0  . labetalol (NORMODYNE) 200 MG tablet TAKE 2 TABLETS BY MOUTH TWICE DAILY 120 tablet 0  . losartan (COZAAR) 100 MG tablet TAKE 1 TABLET BY MOUTH EVERY DAY 30 tablet 11  . Multiple Vitamins-Minerals (MULTIVITAMIN PO) Take 1 tablet by mouth daily as needed (for supplementation).     Marland Kitchen OVER THE COUNTER MEDICATION Take 1 packet by mouth daily as needed (to improve kidney function). Kidney fortify tea    . potassium chloride SA (K-DUR,KLOR-CON) 20 MEQ tablet TAKE 1 TABLET BY  MOUTH DAILY 30 tablet 6  . Propylene Glycol (SYSTANE BALANCE) 0.6 % SOLN Place 1 drop into both eyes daily as needed (for dry eyes).    Marland Kitchen spironolactone (ALDACTONE) 25 MG tablet Take 1 tablet (25 mg total) by mouth daily. 30 tablet 6  . torsemide (DEMADEX) 20 MG tablet TAKE 2 TABLETS BY MOUTH EVERY MORNING 60 tablet 6  . traMADol (ULTRAM) 50 MG tablet Take 1 tablet (50 mg total) by mouth 3 (three) times daily as needed. (Patient taking differently: Take 50 mg by mouth 3 (three) times daily as needed for moderate pain. ) 90 tablet 0   No current facility-administered medications for this visit.    Allergies:   Minoxidil and Naproxen    Social History:  The patient  reports  that she quit smoking about 33 years ago. Her smoking use included Cigarettes. She has a 6 pack-year smoking history. She has never used smokeless tobacco. She reports that she does not drink alcohol or use illicit drugs.   Family History:  The patient's family history includes Colon polyps in her mother; Diabetes in her mother; Hypertension in her brother, mother, and sister. There is no history of Colon cancer.    ROS:  Please see the history of present illness.   Otherwise, review of systems are positive for none.   All other systems are reviewed and negative.    PHYSICAL EXAM: VS:  BP 156/70 mmHg  Pulse 76  Ht 5\' 5"  (1.651 m)  Wt 236 lb (107.049 kg)  BMI 39.27 kg/m2 , BMI Body mass index is 39.27 kg/(m^2). GENERAL:  Well appearing HEENT:  Pupils equal round and reactive, fundi not visualized, oral mucosa unremarkable NECK:  No jugular venous distention, waveform within normal limits, carotid upstroke brisk and symmetric, no bruits, no thyromegaly LYMPHATICS:  No cervical, inguinal adenopathy LUNGS:  Clear to auscultation bilaterally BACK:  No CVA tenderness CHEST:  Unremarkable HEART:  PMI not displaced or sustained,S1 and S2 within normal limits, no S3, no S4, no clicks, no rubs, 2/6 murmur heard best at the right and left upper sternal border, no diastolic murmurs ABD:  Flat, positive bowel sounds normal in frequency in pitch, no bruits, no rebound, no guarding, no midline pulsatile mass, no hepatomegaly, no splenomegaly EXT:  2 plus pulses throughout, mild left greater than right edema, no cyanosis no clubbing SKIN:  No rashes no nodules NEURO:  Cranial nerves II through XII grossly intact, motor grossly intact throughout PSYCH:  Cognitively intact, oriented to person place and time    EKG:  EKG is not ordered today. The ekg ordered 06/27/15 demonstrates sinus rhythm, rate 70, left bundle branch block, left axis deviation. No old EKGs for comparison.   Recent Labs: 10/18/2014:  TSH 1.17 06/27/2015: BUN 50*; Creatinine, Ser 2.57*; Hemoglobin 9.4*; Platelets 225; Potassium 5.0; Sodium 144    Lipid Panel    Component Value Date/Time   CHOL 146 01/10/2012 1404   TRIG 93.0 01/10/2012 1404   HDL 50.20 01/10/2012 1404   CHOLHDL 3 01/10/2012 1404   VLDL 18.6 01/10/2012 1404   LDLCALC 77 01/10/2012 1404      Wt Readings from Last 3 Encounters:  07/07/15 236 lb (107.049 kg)  06/27/15 234 lb 4.8 oz (106.278 kg)  04/27/15 234 lb (106.142 kg)      Other studies Reviewed: Additional studies/ records that were reviewed today include: Echo 2014. Review of the above records demonstrates:  Please see elsewhere in the note.  ASSESSMENT AND PLAN:  DYSPNEA:  She does have risk factors with an abnormal EKG. She needs to be screened for obstructive coronary disease preoperatively. She would be a walk on a treadmill and we would not be able to interpret with a left bundle branch block. She will have a [employer].  CAROTID STENOSIS:    There was right 40% stenosis.  This will be followed in one year.  ABNORMAL EKG:  I suspect this is related to hypertension but will be evaluated as above.  HTN:  Her blood pressures elevated today but she says this is unusual. She rushed to get here. This can be followed and meds titrated as necessary.  Current medicines are reviewed at length with the patient today.  The patient does not have concerns regarding medicines.  The following changes have been made:  no change  Labs/ tests ordered today include:   Orders Placed This Encounter  Procedures  . Myocardial Perfusion Imaging     Disposition:   FU with me as needed.      Signed, Minus Breeding, MD  07/07/2015 11:19 AM    [place]

## 2015-07-07 ENCOUNTER — Ambulatory Visit (INDEPENDENT_AMBULATORY_CARE_PROVIDER_SITE_OTHER): Payer: Medicare Other | Admitting: Cardiology

## 2015-07-07 ENCOUNTER — Encounter: Payer: Self-pay | Admitting: Cardiology

## 2015-07-07 VITALS — BP 156/70 | HR 76 | Ht 65.0 in | Wt 236.0 lb

## 2015-07-07 DIAGNOSIS — N183 Chronic kidney disease, stage 3 (moderate): Secondary | ICD-10-CM | POA: Diagnosis not present

## 2015-07-07 DIAGNOSIS — R0602 Shortness of breath: Secondary | ICD-10-CM

## 2015-07-07 DIAGNOSIS — R9431 Abnormal electrocardiogram [ECG] [EKG]: Secondary | ICD-10-CM | POA: Diagnosis not present

## 2015-07-07 DIAGNOSIS — I129 Hypertensive chronic kidney disease with stage 1 through stage 4 chronic kidney disease, or unspecified chronic kidney disease: Secondary | ICD-10-CM | POA: Diagnosis not present

## 2015-07-07 NOTE — Patient Instructions (Signed)
Your physician recommends that you schedule a follow-up appointment in: As Needed  Your physician has requested that you have a lexiscan myoview. For further information please visit [email or web address]. Please follow instruction sheet, as given.

## 2015-07-20 ENCOUNTER — Other Ambulatory Visit: Payer: Self-pay | Admitting: Internal Medicine

## 2015-07-20 DIAGNOSIS — I129 Hypertensive chronic kidney disease with stage 1 through stage 4 chronic kidney disease, or unspecified chronic kidney disease: Secondary | ICD-10-CM | POA: Diagnosis not present

## 2015-07-20 DIAGNOSIS — Z7689 Persons encountering health services in other specified circumstances: Secondary | ICD-10-CM | POA: Diagnosis not present

## 2015-07-20 DIAGNOSIS — N183 Chronic kidney disease, stage 3 (moderate): Secondary | ICD-10-CM | POA: Diagnosis not present

## 2015-07-20 DIAGNOSIS — N189 Chronic kidney disease, unspecified: Secondary | ICD-10-CM | POA: Diagnosis not present

## 2015-07-20 DIAGNOSIS — N2581 Secondary hyperparathyroidism of renal origin: Secondary | ICD-10-CM | POA: Diagnosis not present

## 2015-07-25 ENCOUNTER — Telehealth (HOSPITAL_COMMUNITY): Payer: Self-pay

## 2015-07-25 NOTE — Telephone Encounter (Signed)
Encounter complete. 

## 2015-07-27 ENCOUNTER — Ambulatory Visit (HOSPITAL_COMMUNITY)
Admission: RE | Admit: 2015-07-27 | Discharge: 2015-07-27 | Disposition: A | Discharge status: Home / Self Care | Payer: Medicare Other | Source: Ambulatory Visit | Attending: Cardiology | Admitting: Cardiology

## 2015-07-27 DIAGNOSIS — R9431 Abnormal electrocardiogram [ECG] [EKG]: Secondary | ICD-10-CM | POA: Insufficient documentation

## 2015-07-27 DIAGNOSIS — I779 Disorder of arteries and arterioles, unspecified: Secondary | ICD-10-CM | POA: Insufficient documentation

## 2015-07-27 DIAGNOSIS — Z6839 Body mass index (BMI) 39.0-39.9, adult: Secondary | ICD-10-CM | POA: Insufficient documentation

## 2015-07-27 DIAGNOSIS — E669 Obesity, unspecified: Secondary | ICD-10-CM | POA: Diagnosis not present

## 2015-07-27 DIAGNOSIS — R0609 Other forms of dyspnea: Secondary | ICD-10-CM | POA: Diagnosis not present

## 2015-07-27 DIAGNOSIS — Z87891 Personal history of nicotine dependence: Secondary | ICD-10-CM | POA: Diagnosis not present

## 2015-07-27 DIAGNOSIS — I10 Essential (primary) hypertension: Secondary | ICD-10-CM | POA: Insufficient documentation

## 2015-07-27 DIAGNOSIS — R0602 Shortness of breath: Secondary | ICD-10-CM

## 2015-07-27 DIAGNOSIS — I447 Left bundle-branch block, unspecified: Secondary | ICD-10-CM | POA: Diagnosis not present

## 2015-07-27 MED ORDER — AMINOPHYLLINE 25 MG/ML IV SOLN
100.0000 mg | Freq: Once | INTRAVENOUS | Status: AC
  Administered 2015-07-27: 100 mg via INTRAVENOUS

## 2015-07-27 MED ORDER — TECHNETIUM TC 99M SESTAMIBI GENERIC - CARDIOLITE
30.3000 | Freq: Once | INTRAVENOUS | Status: AC | PRN
  Administered 2015-07-27: 30.3 via INTRAVENOUS

## 2015-07-27 MED ORDER — REGADENOSON 0.4 MG/5ML IV SOLN
0.4000 mg | Freq: Once | INTRAVENOUS | Status: AC
  Administered 2015-07-27: 0.4 mg via INTRAVENOUS

## 2015-07-28 ENCOUNTER — Ambulatory Visit (HOSPITAL_COMMUNITY)
Admission: RE | Admit: 2015-07-28 | Discharge: 2015-07-28 | Disposition: A | Discharge status: Home / Self Care | Payer: Medicare Other | Source: Ambulatory Visit | Attending: Cardiovascular Disease | Admitting: Cardiovascular Disease

## 2015-07-28 LAB — MYOCARDIAL PERFUSION IMAGING
CHL CUP NUCLEAR SDS: 9
CHL CUP NUCLEAR SRS: 2
CHL CUP RESTING HR STRESS: 76 {beats}/min
LV dias vol: 147 mL (ref 46–106)
LV sys vol: 70 mL
Peak HR: 93 {beats}/min
SSS: 11
TID: 1

## 2015-07-28 MED ORDER — TECHNETIUM TC 99M SESTAMIBI GENERIC - CARDIOLITE
29.7000 | Freq: Once | INTRAVENOUS | Status: AC | PRN
  Administered 2015-07-28: 29.7 via INTRAVENOUS

## 2015-07-31 ENCOUNTER — Other Ambulatory Visit (HOSPITAL_COMMUNITY): Payer: Self-pay | Admitting: *Deleted

## 2015-08-01 ENCOUNTER — Inpatient Hospital Stay (HOSPITAL_COMMUNITY): Admission: RE | Admit: 2015-08-01 | Payer: Medicare Other | Source: Ambulatory Visit

## 2015-08-01 ENCOUNTER — Telehealth: Payer: Self-pay | Admitting: Cardiology

## 2015-08-01 NOTE — Telephone Encounter (Signed)
F/u  Pt returning RN phone call- myoview results. Please call back and discuss.

## 2015-08-03 NOTE — Telephone Encounter (Signed)
Spoke to pt about her stress test

## 2015-08-08 ENCOUNTER — Inpatient Hospital Stay (HOSPITAL_COMMUNITY): Admission: RE | Admit: 2015-08-08 | Payer: Medicare Other | Source: Ambulatory Visit

## 2015-08-16 ENCOUNTER — Encounter (HOSPITAL_COMMUNITY): Payer: Medicare Other

## 2015-08-23 ENCOUNTER — Ambulatory Visit (HOSPITAL_COMMUNITY)
Admission: RE | Admit: 2015-08-23 | Discharge: 2015-08-23 | Disposition: A | Discharge status: Home / Self Care | Payer: Medicare Other | Source: Ambulatory Visit | Attending: Nephrology | Admitting: Nephrology

## 2015-08-23 DIAGNOSIS — Z79899 Other long term (current) drug therapy: Secondary | ICD-10-CM | POA: Insufficient documentation

## 2015-08-23 DIAGNOSIS — Z5181 Encounter for therapeutic drug level monitoring: Secondary | ICD-10-CM | POA: Diagnosis not present

## 2015-08-23 DIAGNOSIS — N183 Chronic kidney disease, stage 3 (moderate): Secondary | ICD-10-CM | POA: Insufficient documentation

## 2015-08-23 DIAGNOSIS — D631 Anemia in chronic kidney disease: Secondary | ICD-10-CM | POA: Insufficient documentation

## 2015-08-23 LAB — POCT HEMOGLOBIN-HEMACUE: Hemoglobin: 9.5 g/dL — ABNORMAL LOW (ref 12.0–15.0)

## 2015-08-23 MED ORDER — EPOETIN ALFA 20000 UNIT/ML IJ SOLN
20000.0000 [IU] | INTRAMUSCULAR | Status: DC
  Administered 2015-08-23: 20000 [IU] via SUBCUTANEOUS

## 2015-08-23 MED ORDER — EPOETIN ALFA 20000 UNIT/ML IJ SOLN
INTRAMUSCULAR | Status: AC
  Filled 2015-08-23: qty 1

## 2015-08-23 NOTE — Discharge Instructions (Signed)
Epoetin Alfa injection What is this medicine? EPOETIN ALFA (e POE e tin AL fa) helps your body make more red blood cells. This medicine is used to treat anemia caused by chronic kidney failure, cancer chemotherapy, or HIV-therapy. It may also be used before surgery if you have anemia. This medicine may be used for other purposes; ask your health care provider or pharmacist if you have questions. What should I tell my health care provider before I take this medicine? They need to know if you have any of these conditions: -blood clotting disorders -cancer patient not on chemotherapy -cystic fibrosis -heart disease, such as angina or heart failure -hemoglobin level of 12 g/dL or greater -high blood pressure -low levels of folate, iron, or vitamin B12 -seizures -an unusual or allergic reaction to erythropoietin, albumin, benzyl alcohol, hamster proteins, other medicines, foods, dyes, or preservatives -pregnant or trying to get pregnant -breast-feeding How should I use this medicine? This medicine is for injection into a vein or under the skin. It is usually given by a health care professional in a hospital or clinic setting. If you get this medicine at home, you will be taught how to prepare and give this medicine. Use exactly as directed. Take your medicine at regular intervals. Do not take your medicine more often than directed. It is important that you put your used needles and syringes in a special sharps container. Do not put them in a trash can. If you do not have a sharps container, call your pharmacist or healthcare provider to get one. Talk to your pediatrician regarding the use of this medicine in children. While this drug may be prescribed for selected conditions, precautions do apply. Overdosage: If you think you have taken too much of this medicine contact a poison control center or emergency room at once. NOTE: This medicine is only for you. Do not share this medicine with  others. What if I miss a dose? If you miss a dose, take it as soon as you can. If it is almost time for your next dose, take only that dose. Do not take double or extra doses. What may interact with this medicine? Do not take this medicine with any of the following medications: -darbepoetin alfa This list may not describe all possible interactions. Give your health care provider a list of all the medicines, herbs, non-prescription drugs, or dietary supplements you use. Also tell them if you smoke, drink alcohol, or use illegal drugs. Some items may interact with your medicine. What should I watch for while using this medicine? Visit your prescriber or health care professional for regular checks on your progress and for the needed blood tests and blood pressure measurements. It is especially important for the doctor to make sure your hemoglobin level is in the desired range, to limit the risk of potential side effects and to give you the best benefit. Keep all appointments for any recommended tests. Check your blood pressure as directed. Ask your doctor what your blood pressure should be and when you should contact him or her. As your body makes more red blood cells, you may need to take iron, folic acid, or vitamin B supplements. Ask your doctor or health care provider which products are right for you. If you have kidney disease continue dietary restrictions, even though this medication can make you feel better. Talk with your doctor or health care professional about the foods you eat and the vitamins that you take. What side effects may I notice   from receiving this medicine? Side effects that you should report to your doctor or health care professional as soon as possible: -allergic reactions like skin rash, itching or hives, swelling of the face, lips, or tongue -breathing problems -changes in vision -chest pain -confusion, trouble speaking or understanding -feeling faint or lightheaded,  falls -high blood pressure -muscle aches or pains -pain, swelling, warmth in the leg -rapid weight gain -severe headaches -sudden numbness or weakness of the face, arm or leg -trouble walking, dizziness, loss of balance or coordination -seizures (convulsions) -swelling of the ankles, feet, hands -unusually weak or tired Side effects that usually do not require medical attention (report to your doctor or health care professional if they continue or are bothersome): -diarrhea -fever, chills (flu-like symptoms) -headaches -nausea, vomiting -redness, stinging, or swelling at site where injected This list may not describe all possible side effects. Call your doctor for medical advice about side effects. You may report side effects to FDA at 1-800-FDA-1088. Where should I keep my medicine? Keep out of the reach of children. Store in a refrigerator between 2 and 8 degrees C (36 and 46 degrees F). Do not freeze or shake. Throw away any unused portion if using a single-dose vial. Multi-dose vials can be kept in the refrigerator for up to 21 days after the initial dose. Throw away unused medicine. NOTE: This sheet is a summary. It may not cover all possible information. If you have questions about this medicine, talk to your doctor, pharmacist, or health care provider.    2016, Elsevier/Gold Standard. (2008-03-08 10:25:44)  

## 2015-08-25 DIAGNOSIS — N183 Chronic kidney disease, stage 3 (moderate): Secondary | ICD-10-CM | POA: Diagnosis not present

## 2015-08-25 DIAGNOSIS — N2581 Secondary hyperparathyroidism of renal origin: Secondary | ICD-10-CM | POA: Diagnosis not present

## 2015-09-12 ENCOUNTER — Other Ambulatory Visit (HOSPITAL_COMMUNITY): Payer: Self-pay | Admitting: *Deleted

## 2015-09-13 ENCOUNTER — Encounter (HOSPITAL_COMMUNITY)
Admission: RE | Admit: 2015-09-13 | Discharge: 2015-09-13 | Disposition: A | Discharge status: Home / Self Care | Payer: Medicare Other | Source: Ambulatory Visit | Attending: Nephrology | Admitting: Nephrology

## 2015-09-13 DIAGNOSIS — D631 Anemia in chronic kidney disease: Secondary | ICD-10-CM | POA: Insufficient documentation

## 2015-09-13 DIAGNOSIS — Z79899 Other long term (current) drug therapy: Secondary | ICD-10-CM | POA: Insufficient documentation

## 2015-09-13 DIAGNOSIS — N183 Chronic kidney disease, stage 3 (moderate): Secondary | ICD-10-CM | POA: Insufficient documentation

## 2015-09-13 DIAGNOSIS — Z5181 Encounter for therapeutic drug level monitoring: Secondary | ICD-10-CM | POA: Diagnosis not present

## 2015-09-13 LAB — POCT HEMOGLOBIN-HEMACUE: Hemoglobin: 9.4 g/dL — ABNORMAL LOW (ref 12.0–15.0)

## 2015-09-13 MED ORDER — EPOETIN ALFA 40000 UNIT/ML IJ SOLN: 30000.0000 [IU] | INTRAMUSCULAR | Status: DC

## 2015-09-13 MED ORDER — EPOETIN ALFA 10000 UNIT/ML IJ SOLN
INTRAMUSCULAR | Status: AC
  Filled 2015-09-13: qty 1

## 2015-09-13 MED ORDER — EPOETIN ALFA 20000 UNIT/ML IJ SOLN
INTRAMUSCULAR | Status: AC
  Administered 2015-09-13: 30000 [IU]
  Filled 2015-09-13: qty 1

## 2015-09-13 MED ORDER — CLONIDINE HCL 0.1 MG PO TABS: 0.1000 mg | ORAL_TABLET | Freq: Once | ORAL | Status: DC | PRN

## 2015-09-14 MED FILL — Epoetin Alfa Inj 10000 Unit/ML: INTRAMUSCULAR | Qty: 1 | Status: AC

## 2015-09-14 MED FILL — Epoetin Alfa Inj 20000 Unit/ML: INTRAMUSCULAR | Qty: 1 | Status: AC

## 2015-10-03 ENCOUNTER — Other Ambulatory Visit (HOSPITAL_COMMUNITY): Payer: Self-pay | Admitting: *Deleted

## 2015-10-04 ENCOUNTER — Encounter (HOSPITAL_COMMUNITY)
Admission: RE | Admit: 2015-10-04 | Discharge: 2015-10-04 | Disposition: A | Discharge status: Home / Self Care | Payer: Medicare Other | Source: Ambulatory Visit | Attending: Nephrology | Admitting: Nephrology

## 2015-10-04 DIAGNOSIS — Z5181 Encounter for therapeutic drug level monitoring: Secondary | ICD-10-CM | POA: Diagnosis not present

## 2015-10-04 DIAGNOSIS — D631 Anemia in chronic kidney disease: Secondary | ICD-10-CM | POA: Diagnosis not present

## 2015-10-04 DIAGNOSIS — Z79899 Other long term (current) drug therapy: Secondary | ICD-10-CM | POA: Diagnosis not present

## 2015-10-04 DIAGNOSIS — N183 Chronic kidney disease, stage 3 (moderate): Secondary | ICD-10-CM | POA: Diagnosis not present

## 2015-10-04 LAB — IRON AND TIBC
IRON: 32 ug/dL (ref 28–170)
SATURATION RATIOS: 11 % (ref 10.4–31.8)
TIBC: 283 ug/dL (ref 250–450)
UIBC: 251 ug/dL

## 2015-10-04 LAB — FERRITIN: Ferritin: 28 ng/mL (ref 11–307)

## 2015-10-04 LAB — POCT HEMOGLOBIN-HEMACUE: Hemoglobin: 9.2 g/dL — ABNORMAL LOW (ref 12.0–15.0)

## 2015-10-04 MED ORDER — EPOETIN ALFA 10000 UNIT/ML IJ SOLN
INTRAMUSCULAR | Status: AC
  Administered 2015-10-04: 10000 [IU]
  Filled 2015-10-04: qty 1

## 2015-10-04 MED ORDER — CLONIDINE HCL 0.1 MG PO TABS: 0.1000 mg | ORAL_TABLET | Freq: Once | ORAL | Status: DC | PRN

## 2015-10-04 MED ORDER — EPOETIN ALFA 20000 UNIT/ML IJ SOLN
INTRAMUSCULAR | Status: AC
  Administered 2015-10-04: 20000 [IU]
  Filled 2015-10-04: qty 1

## 2015-10-04 MED ORDER — EPOETIN ALFA 40000 UNIT/ML IJ SOLN: 30000.0000 [IU] | INTRAMUSCULAR | Status: DC

## 2015-10-11 ENCOUNTER — Encounter (HOSPITAL_COMMUNITY): Payer: Medicare Other

## 2015-10-18 ENCOUNTER — Other Ambulatory Visit (HOSPITAL_COMMUNITY): Payer: Self-pay | Admitting: *Deleted

## 2015-10-19 ENCOUNTER — Encounter (HOSPITAL_COMMUNITY)
Admission: RE | Admit: 2015-10-19 | Discharge: 2015-10-19 | Disposition: A | Discharge status: Home / Self Care | Payer: Medicare Other | Source: Ambulatory Visit | Attending: Nephrology | Admitting: Nephrology

## 2015-10-19 DIAGNOSIS — N183 Chronic kidney disease, stage 3 (moderate): Secondary | ICD-10-CM | POA: Diagnosis not present

## 2015-10-19 DIAGNOSIS — Z5181 Encounter for therapeutic drug level monitoring: Secondary | ICD-10-CM | POA: Diagnosis not present

## 2015-10-19 DIAGNOSIS — Z79899 Other long term (current) drug therapy: Secondary | ICD-10-CM | POA: Insufficient documentation

## 2015-10-19 DIAGNOSIS — D631 Anemia in chronic kidney disease: Secondary | ICD-10-CM | POA: Diagnosis not present

## 2015-10-19 LAB — POCT HEMOGLOBIN-HEMACUE: Hemoglobin: 9.7 g/dL — ABNORMAL LOW (ref 12.0–15.0)

## 2015-10-19 MED ORDER — EPOETIN ALFA 20000 UNIT/ML IJ SOLN
INTRAMUSCULAR | Status: AC
  Administered 2015-10-19: 20000 [IU] via SUBCUTANEOUS
  Filled 2015-10-19: qty 1

## 2015-10-19 MED ORDER — EPOETIN ALFA 10000 UNIT/ML IJ SOLN
INTRAMUSCULAR | Status: AC
  Administered 2015-10-19: 10000 [IU] via SUBCUTANEOUS
  Filled 2015-10-19: qty 1

## 2015-10-19 MED ORDER — EPOETIN ALFA 40000 UNIT/ML IJ SOLN: 30000.0000 [IU] | INTRAMUSCULAR | Status: DC

## 2015-10-19 MED ORDER — SODIUM CHLORIDE 0.9 % IV SOLN
510.0000 mg | INTRAVENOUS | Status: DC
  Administered 2015-10-19: 510 mg via INTRAVENOUS
  Filled 2015-10-19: qty 17

## 2015-10-19 NOTE — Discharge Instructions (Signed)
Ferumoxytol injection What is this medicine? FERUMOXYTOL is an iron complex. Iron is used to make healthy red blood cells, which carry oxygen and nutrients throughout the body. This medicine is used to treat iron deficiency anemia in people with chronic kidney disease. This medicine may be used for other purposes; ask your health care provider or pharmacist if you have questions. What should I tell my health care provider before I take this medicine? They need to know if you have any of these conditions: -anemia not caused by low iron levels -high levels of iron in the blood -magnetic resonance imaging (MRI) test scheduled -an unusual or allergic reaction to iron, other medicines, foods, dyes, or preservatives -pregnant or trying to get pregnant -breast-feeding How should I use this medicine? This medicine is for injection into a vein. It is given by a health care professional in a hospital or clinic setting. Talk to your pediatrician regarding the use of this medicine in children. Special care may be needed. Overdosage: If you think you have taken too much of this medicine contact a poison control center or emergency room at once. NOTE: This medicine is only for you. Do not share this medicine with others. What if I miss a dose? It is important not to miss your dose. Call your doctor or health care professional if you are unable to keep an appointment. What may interact with this medicine? This medicine may interact with the following medications: -other iron products This list may not describe all possible interactions. Give your health care provider a list of all the medicines, herbs, non-prescription drugs, or dietary supplements you use. Also tell them if you smoke, drink alcohol, or use illegal drugs. Some items may interact with your medicine. What should I watch for while using this medicine? Visit your doctor or healthcare professional regularly. Tell your doctor or healthcare  professional if your symptoms do not start to get better or if they get worse. You may need blood work done while you are taking this medicine. You may need to follow a special diet. Talk to your doctor. Foods that contain iron include: whole grains/cereals, dried fruits, beans, or peas, leafy green vegetables, and organ meats (liver, kidney). What side effects may I notice from receiving this medicine? Side effects that you should report to your doctor or health care professional as soon as possible: -allergic reactions like skin rash, itching or hives, swelling of the face, lips, or tongue -breathing problems -changes in blood pressure -feeling faint or lightheaded, falls -fever or chills -flushing, sweating, or hot feelings -swelling of the ankles or feet Side effects that usually do not require medical attention (Report these to your doctor or health care professional if they continue or are bothersome.): -diarrhea -headache -nausea, vomiting -stomach pain This list may not describe all possible side effects. Call your doctor for medical advice about side effects. You may report side effects to FDA at 1-800-FDA-1088. Where should I keep my medicine? This drug is given in a hospital or clinic and will not be stored at home. NOTE: This sheet is a summary. It may not cover all possible information. If you have questions about this medicine, talk to your doctor, pharmacist, or health care provider.    2016, Elsevier/Gold Standard. (2011-11-08 15:23:36) Ferumoxytol injection What is this medicine? FERUMOXYTOL is an iron complex. Iron is used to make healthy red blood cells, which carry oxygen and nutrients throughout the body. This medicine is used to treat iron deficiency anemia  in people with chronic kidney disease. This medicine may be used for other purposes; ask your health care provider or pharmacist if you have questions. What should I tell my health care provider before I take this  medicine? They need to know if you have any of these conditions: -anemia not caused by low iron levels -high levels of iron in the blood -magnetic resonance imaging (MRI) test scheduled -an unusual or allergic reaction to iron, other medicines, foods, dyes, or preservatives -pregnant or trying to get pregnant -breast-feeding How should I use this medicine? This medicine is for injection into a vein. It is given by a health care professional in a hospital or clinic setting. Talk to your pediatrician regarding the use of this medicine in children. Special care may be needed. Overdosage: If you think you have taken too much of this medicine contact a poison control center or emergency room at once. NOTE: This medicine is only for you. Do not share this medicine with others. What if I miss a dose? It is important not to miss your dose. Call your doctor or health care professional if you are unable to keep an appointment. What may interact with this medicine? This medicine may interact with the following medications: -other iron products This list may not describe all possible interactions. Give your health care provider a list of all the medicines, herbs, non-prescription drugs, or dietary supplements you use. Also tell them if you smoke, drink alcohol, or use illegal drugs. Some items may interact with your medicine. What should I watch for while using this medicine? Visit your doctor or healthcare professional regularly. Tell your doctor or healthcare professional if your symptoms do not start to get better or if they get worse. You may need blood work done while you are taking this medicine. You may need to follow a special diet. Talk to your doctor. Foods that contain iron include: whole grains/cereals, dried fruits, beans, or peas, leafy green vegetables, and organ meats (liver, kidney). What side effects may I notice from receiving this medicine? Side effects that you should report to your  doctor or health care professional as soon as possible: -allergic reactions like skin rash, itching or hives, swelling of the face, lips, or tongue -breathing problems -changes in blood pressure -feeling faint or lightheaded, falls -fever or chills -flushing, sweating, or hot feelings -swelling of the ankles or feet Side effects that usually do not require medical attention (Report these to your doctor or health care professional if they continue or are bothersome.): -diarrhea -headache -nausea, vomiting -stomach pain This list may not describe all possible side effects. Call your doctor for medical advice about side effects. You may report side effects to FDA at 1-800-FDA-1088. Where should I keep my medicine? This drug is given in a hospital or clinic and will not be stored at home. NOTE: This sheet is a summary. It may not cover all possible information. If you have questions about this medicine, talk to your doctor, pharmacist, or health care provider.    2016, Elsevier/[employer] Standard. (2011-11-08 15:23:36)

## 2015-10-26 ENCOUNTER — Other Ambulatory Visit (HOSPITAL_COMMUNITY): Payer: Self-pay | Admitting: *Deleted

## 2015-10-27 ENCOUNTER — Encounter (HOSPITAL_COMMUNITY)
Admission: RE | Admit: 2015-10-27 | Discharge: 2015-10-27 | Disposition: A | Discharge status: Home / Self Care | Payer: Medicare Other | Source: Ambulatory Visit | Attending: Nephrology | Admitting: Nephrology

## 2015-10-27 DIAGNOSIS — D631 Anemia in chronic kidney disease: Secondary | ICD-10-CM | POA: Diagnosis not present

## 2015-10-27 DIAGNOSIS — Z5181 Encounter for therapeutic drug level monitoring: Secondary | ICD-10-CM | POA: Diagnosis not present

## 2015-10-27 DIAGNOSIS — Z79899 Other long term (current) drug therapy: Secondary | ICD-10-CM | POA: Diagnosis not present

## 2015-10-27 DIAGNOSIS — N183 Chronic kidney disease, stage 3 (moderate): Secondary | ICD-10-CM | POA: Diagnosis not present

## 2015-10-27 MED ORDER — SODIUM CHLORIDE 0.9 % IV SOLN
510.0000 mg | INTRAVENOUS | Status: AC
  Administered 2015-10-27: 510 mg via INTRAVENOUS
  Filled 2015-10-27: qty 17

## 2015-11-02 ENCOUNTER — Other Ambulatory Visit: Payer: Self-pay | Admitting: Internal Medicine

## 2015-11-02 ENCOUNTER — Encounter (HOSPITAL_COMMUNITY)
Admission: RE | Admit: 2015-11-02 | Discharge: 2015-11-02 | Disposition: A | Discharge status: Home / Self Care | Payer: Medicare Other | Source: Ambulatory Visit | Attending: Nephrology | Admitting: Nephrology

## 2015-11-02 DIAGNOSIS — N189 Chronic kidney disease, unspecified: Principal | ICD-10-CM

## 2015-11-02 DIAGNOSIS — N183 Chronic kidney disease, stage 3 (moderate): Secondary | ICD-10-CM | POA: Diagnosis not present

## 2015-11-02 DIAGNOSIS — Z79899 Other long term (current) drug therapy: Secondary | ICD-10-CM | POA: Diagnosis not present

## 2015-11-02 DIAGNOSIS — D631 Anemia in chronic kidney disease: Secondary | ICD-10-CM | POA: Diagnosis not present

## 2015-11-02 DIAGNOSIS — Z5181 Encounter for therapeutic drug level monitoring: Secondary | ICD-10-CM | POA: Diagnosis not present

## 2015-11-02 LAB — POCT HEMOGLOBIN-HEMACUE: Hemoglobin: 10.3 g/dL — ABNORMAL LOW (ref 12.0–15.0)

## 2015-11-02 MED ORDER — EPOETIN ALFA 10000 UNIT/ML IJ SOLN
30000.0000 [IU] | INTRAMUSCULAR | Status: DC
  Administered 2015-11-02: 10000 [IU] via SUBCUTANEOUS

## 2015-11-02 MED ORDER — EPOETIN ALFA 10000 UNIT/ML IJ SOLN
INTRAMUSCULAR | Status: AC
  Filled 2015-11-02: qty 1

## 2015-11-02 MED ORDER — EPOETIN ALFA 20000 UNIT/ML IJ SOLN
INTRAMUSCULAR | Status: AC
  Administered 2015-11-02: 20000 [IU]
  Filled 2015-11-02: qty 1

## 2015-11-16 ENCOUNTER — Encounter (HOSPITAL_COMMUNITY)
Admission: RE | Admit: 2015-11-16 | Discharge: 2015-11-16 | Disposition: A | Discharge status: Home / Self Care | Payer: Medicare Other | Source: Ambulatory Visit | Attending: Nephrology | Admitting: Nephrology

## 2015-11-16 DIAGNOSIS — N183 Chronic kidney disease, stage 3 (moderate): Secondary | ICD-10-CM | POA: Diagnosis not present

## 2015-11-16 DIAGNOSIS — Z79899 Other long term (current) drug therapy: Secondary | ICD-10-CM | POA: Diagnosis not present

## 2015-11-16 DIAGNOSIS — N189 Chronic kidney disease, unspecified: Secondary | ICD-10-CM

## 2015-11-16 DIAGNOSIS — D631 Anemia in chronic kidney disease: Secondary | ICD-10-CM | POA: Diagnosis not present

## 2015-11-16 DIAGNOSIS — Z5181 Encounter for therapeutic drug level monitoring: Secondary | ICD-10-CM | POA: Diagnosis not present

## 2015-11-16 LAB — FERRITIN: Ferritin: 331 ng/mL — ABNORMAL HIGH (ref 11–307)

## 2015-11-16 LAB — IRON AND TIBC
Iron: 58 ug/dL (ref 28–170)
SATURATION RATIOS: 24 % (ref 10.4–31.8)
TIBC: 239 ug/dL — AB (ref 250–450)
UIBC: 181 ug/dL

## 2015-11-16 LAB — POCT HEMOGLOBIN-HEMACUE: HEMOGLOBIN: 10.5 g/dL — AB (ref 12.0–15.0)

## 2015-11-16 MED ORDER — EPOETIN ALFA 40000 UNIT/ML IJ SOLN: 30000.0000 [IU] | INTRAMUSCULAR | Status: DC

## 2015-11-16 MED ORDER — EPOETIN ALFA 20000 UNIT/ML IJ SOLN
INTRAMUSCULAR | Status: AC
  Administered 2015-11-16: 20000 [IU] via SUBCUTANEOUS
  Filled 2015-11-16: qty 1

## 2015-11-16 MED ORDER — EPOETIN ALFA 10000 UNIT/ML IJ SOLN
INTRAMUSCULAR | Status: AC
  Administered 2015-11-16: 10000 [IU] via SUBCUTANEOUS
  Filled 2015-11-16: qty 1

## 2015-11-29 ENCOUNTER — Other Ambulatory Visit (HOSPITAL_COMMUNITY): Payer: Self-pay

## 2015-11-30 ENCOUNTER — Encounter (HOSPITAL_COMMUNITY)
Admission: RE | Admit: 2015-11-30 | Discharge: 2015-11-30 | Disposition: A | Discharge status: Home / Self Care | Payer: Medicare Other | Source: Ambulatory Visit | Attending: Nephrology | Admitting: Nephrology

## 2015-11-30 DIAGNOSIS — Z79899 Other long term (current) drug therapy: Secondary | ICD-10-CM | POA: Diagnosis not present

## 2015-11-30 DIAGNOSIS — D631 Anemia in chronic kidney disease: Secondary | ICD-10-CM

## 2015-11-30 DIAGNOSIS — Z5181 Encounter for therapeutic drug level monitoring: Secondary | ICD-10-CM | POA: Diagnosis not present

## 2015-11-30 DIAGNOSIS — N183 Chronic kidney disease, stage 3 (moderate): Secondary | ICD-10-CM | POA: Diagnosis not present

## 2015-11-30 DIAGNOSIS — N189 Chronic kidney disease, unspecified: Principal | ICD-10-CM

## 2015-11-30 LAB — POCT HEMOGLOBIN-HEMACUE: HEMOGLOBIN: 10.7 g/dL — AB (ref 12.0–15.0)

## 2015-11-30 MED ORDER — SODIUM CHLORIDE 0.9 % IV SOLN
510.0000 mg | Freq: Once | INTRAVENOUS | Status: AC
  Administered 2015-11-30: 510 mg via INTRAVENOUS
  Filled 2015-11-30: qty 17

## 2015-11-30 MED ORDER — EPOETIN ALFA 10000 UNIT/ML IJ SOLN
INTRAMUSCULAR | Status: AC
  Administered 2015-11-30: 10000 [IU]
  Filled 2015-11-30: qty 1

## 2015-11-30 MED ORDER — EPOETIN ALFA 20000 UNIT/ML IJ SOLN
INTRAMUSCULAR | Status: AC
  Administered 2015-11-30: 20000 [IU]
  Filled 2015-11-30: qty 1

## 2015-11-30 MED ORDER — EPOETIN ALFA 40000 UNIT/ML IJ SOLN: 30000.0000 [IU] | INTRAMUSCULAR | Status: DC

## 2015-12-08 ENCOUNTER — Encounter: Payer: Self-pay | Admitting: Internal Medicine

## 2015-12-08 ENCOUNTER — Other Ambulatory Visit: Payer: Self-pay | Admitting: Internal Medicine

## 2015-12-08 ENCOUNTER — Other Ambulatory Visit (INDEPENDENT_AMBULATORY_CARE_PROVIDER_SITE_OTHER): Payer: Medicare Other

## 2015-12-08 ENCOUNTER — Ambulatory Visit (INDEPENDENT_AMBULATORY_CARE_PROVIDER_SITE_OTHER): Payer: Medicare Other | Admitting: Internal Medicine

## 2015-12-08 VITALS — BP 154/70 | HR 81 | Temp 98.4°F | Resp 18 | Ht 66.0 in | Wt 233.4 lb

## 2015-12-08 DIAGNOSIS — N189 Chronic kidney disease, unspecified: Secondary | ICD-10-CM

## 2015-12-08 DIAGNOSIS — N183 Chronic kidney disease, stage 3 unspecified: Secondary | ICD-10-CM

## 2015-12-08 DIAGNOSIS — M5441 Lumbago with sciatica, right side: Secondary | ICD-10-CM | POA: Diagnosis not present

## 2015-12-08 DIAGNOSIS — D631 Anemia in chronic kidney disease: Secondary | ICD-10-CM

## 2015-12-08 DIAGNOSIS — R300 Dysuria: Secondary | ICD-10-CM

## 2015-12-08 DIAGNOSIS — G8929 Other chronic pain: Secondary | ICD-10-CM

## 2015-12-08 DIAGNOSIS — I10 Essential (primary) hypertension: Secondary | ICD-10-CM

## 2015-12-08 LAB — RENAL FUNCTION PANEL
ALBUMIN: 3.8 g/dL (ref 3.5–5.2)
BUN: 44 mg/dL — AB (ref 6–23)
CO2: 25 mEq/L (ref 19–32)
CREATININE: 2.42 mg/dL — AB (ref 0.40–1.20)
Calcium: 9.5 mg/dL (ref 8.4–10.5)
Chloride: 109 mEq/L (ref 96–112)
GFR: 25.41 mL/min — ABNORMAL LOW (ref >60.00)
GLUCOSE: 86 mg/dL (ref 70–99)
PHOSPHORUS: 3.1 mg/dL (ref 2.3–4.6)
Potassium: 5.5 mEq/L — ABNORMAL HIGH (ref 3.5–5.1)
SODIUM: 140 meq/L (ref 135–145)

## 2015-12-08 LAB — CBC
HEMATOCRIT: 34.8 % — AB (ref 36.0–46.0)
Hemoglobin: 11.6 g/dL — ABNORMAL LOW (ref 12.0–15.0)
MCHC: 33.4 g/dL (ref 30.0–36.0)
MCV: 90.4 fl (ref 78.0–100.0)
Platelets: 211 10*3/uL (ref 150.0–400.0)
RBC: 3.86 Mil/uL — ABNORMAL LOW (ref 3.87–5.11)
RDW: 18.3 % — AB (ref 11.5–15.5)
WBC: 7.4 10*3/uL (ref 4.0–10.5)

## 2015-12-08 LAB — URINALYSIS, ROUTINE W REFLEX MICROSCOPIC
Bilirubin Urine: NEGATIVE
Hgb urine dipstick: NEGATIVE
KETONES UR: NEGATIVE
Nitrite: NEGATIVE
PH: 5.5 (ref 5.0–8.0)
SPECIFIC GRAVITY, URINE: 1.02 (ref 1.000–1.030)
TOTAL PROTEIN, URINE-UPE24: NEGATIVE
Urine Glucose: NEGATIVE
Urobilinogen, UA: 0.2 (ref 0.0–1.0)

## 2015-12-08 LAB — IRON: IRON: 66 ug/dL (ref 42–145)

## 2015-12-08 MED ORDER — GABAPENTIN 300 MG PO CAPS: 300.0000 mg | ORAL_CAPSULE | Freq: Three times a day (TID) | ORAL | 3 refills | Status: DC | PRN

## 2015-12-08 MED ORDER — SULFAMETHOXAZOLE-TRIMETHOPRIM 800-160 MG PO TABS: 1.0000 | ORAL_TABLET | Freq: Two times a day (BID) | ORAL | 0 refills | Status: DC

## 2015-12-08 NOTE — Progress Notes (Signed)
Pre visit review using our clinic review tool, if applicable. No additional management support is needed unless otherwise documented below in the visit note. 

## 2015-12-08 NOTE — Assessment & Plan Note (Signed)
BP is significantly elevated upon arrival and still mildly elevated before leaving. She is getting her meds from her nephrologist. If her BP are running high all the time she could be accumulating damage to her kidneys and counseled her on the need to take her medicine regularly and on time to avoid progression of her CKD. Checking renal panel.

## 2015-12-08 NOTE — Assessment & Plan Note (Signed)
She may have had progression to CKD stage 4 based on last labs in our computer. Checking today and adjust as needed. Will forward results of renal panel, PTH, iron studies to her nephrologist.

## 2015-12-08 NOTE — Patient Instructions (Signed)
We are checking the blood and urine today. We will call you back with the results and send them to Dr. Jimmy Footman.

## 2015-12-08 NOTE — Assessment & Plan Note (Signed)
Checking iron and CBC today and will forward the results to her nephrologist.

## 2015-12-08 NOTE — Assessment & Plan Note (Signed)
Rx for gabapentin for the pain given today. She states that lyrica worked better but is too expensive and she cannot afford to take it anymore.

## 2015-12-08 NOTE — Progress Notes (Signed)
   Subjective:    Patient ID: [name], female    DOB: 1946-04-08, 70 y.o.   MRN: [id number]  HPI The patient is a 70 YO female coming in for overall not feeling well and concerns about her health. She has underwent stress test in the last several months for a back surgery which she never had. This was okay but they found low blood counts and now she is on shots for her kidneys. She is okay with doing them but they cost her several hundred dollars per time and she is doing them every 2 weeks. She was also doing some iron infusions for a while which were also expensive and she cannot afford them. Sees her kidney specialist on the [address] [place]. She is having fairly normal BP at home and she has not taken her medications today. She is not able to take her medicines and leave the house since they make her tired and dizzy. She is still having severe back pain and takes some pain medicine for that. She still wants to get the surgery to help the pain. The pain is in her low back and going down both of her legs and into her feet. Worse with prolonged sitting or standing. Her pain meds help some but mostly 6-7/10 all the time.  She is also just not feeling well and wonders if she has a bladder infection as she gets those sometimes. Urinating a little more. No burning or stomach pain.   Review of Systems  Constitutional: Positive for activity change. Negative for appetite change, chills, fatigue, fever and unexpected weight change.  HENT: Negative.   Eyes: Negative.   Respiratory: Negative for cough, chest tightness, shortness of breath and wheezing.   Cardiovascular: Negative for chest pain, palpitations and leg swelling.  Gastrointestinal: Negative for abdominal distention, abdominal pain, constipation and diarrhea.  Genitourinary: Positive for frequency and urgency. Negative for dysuria and flank pain.  Musculoskeletal: Positive for arthralgias, back pain and gait problem. Negative for joint swelling  and myalgias.  Skin: Negative.   Neurological:       Tingling in her neck left side  Psychiatric/Behavioral: Negative.       Objective:   Physical Exam  Constitutional: She is oriented to person, place, and time. She appears well-developed and well-nourished.  HENT:  Head: Normocephalic and atraumatic.  Eyes: EOM are normal.  Neck: Normal range of motion.  Cardiovascular: Normal rate and regular rhythm.   Pulmonary/Chest: Effort normal and breath sounds normal. No respiratory distress. She has no wheezes.  Abdominal: Soft. Bowel sounds are normal. She exhibits no distension. There is no tenderness. There is no rebound.  Musculoskeletal:  Pain in her back.   Neurological: She is alert and oriented to person, place, and time.  Psychiatric: She has a normal mood and affect. Her behavior is normal.   Vitals:   12/08/15 0947 12/08/15 1020  BP: (!) 200/90 (!) 154/70  Pulse: 81   Resp: 18   Temp: 98.4 F (36.9 C)   TempSrc: Oral   SpO2: 96%   Weight: 233 lb 6.4 oz (105.9 kg)   Height: 5\' 6"  (1.676 m)       Assessment & Plan:

## 2015-12-08 NOTE — Assessment & Plan Note (Signed)
Checking U/A and treat if appropriate.

## 2015-12-12 LAB — PTH, INTACT AND CALCIUM
Calcium: 9.6 mg/dL (ref 8.6–10.4)
PTH: 138 pg/mL — ABNORMAL HIGH (ref 14–64)

## 2015-12-14 ENCOUNTER — Encounter (HOSPITAL_COMMUNITY)
Admission: RE | Admit: 2015-12-14 | Discharge: 2015-12-14 | Disposition: A | Discharge status: Home / Self Care | Payer: Medicare Other | Source: Ambulatory Visit | Attending: Nephrology | Admitting: Nephrology

## 2015-12-14 DIAGNOSIS — Z5181 Encounter for therapeutic drug level monitoring: Secondary | ICD-10-CM | POA: Insufficient documentation

## 2015-12-14 DIAGNOSIS — Z79899 Other long term (current) drug therapy: Secondary | ICD-10-CM | POA: Diagnosis not present

## 2015-12-14 DIAGNOSIS — N183 Chronic kidney disease, stage 3 (moderate): Secondary | ICD-10-CM | POA: Insufficient documentation

## 2015-12-14 DIAGNOSIS — D631 Anemia in chronic kidney disease: Secondary | ICD-10-CM | POA: Insufficient documentation

## 2015-12-14 DIAGNOSIS — N189 Chronic kidney disease, unspecified: Secondary | ICD-10-CM

## 2015-12-14 LAB — POCT HEMOGLOBIN-HEMACUE: Hemoglobin: 11.5 g/dL — ABNORMAL LOW (ref 12.0–15.0)

## 2015-12-14 MED ORDER — EPOETIN ALFA 20000 UNIT/ML IJ SOLN
INTRAMUSCULAR | Status: AC
  Administered 2015-12-14: 20000 [IU] via SUBCUTANEOUS
  Filled 2015-12-14: qty 1

## 2015-12-14 MED ORDER — EPOETIN ALFA 10000 UNIT/ML IJ SOLN
INTRAMUSCULAR | Status: AC
  Administered 2015-12-14: 10000 [IU] via SUBCUTANEOUS
  Filled 2015-12-14: qty 1

## 2015-12-14 MED ORDER — EPOETIN ALFA 40000 UNIT/ML IJ SOLN: 30000.0000 [IU] | INTRAMUSCULAR | Status: DC

## 2015-12-28 ENCOUNTER — Encounter (HOSPITAL_COMMUNITY)
Admission: RE | Admit: 2015-12-28 | Discharge: 2015-12-28 | Disposition: A | Discharge status: Home / Self Care | Payer: Medicare Other | Source: Ambulatory Visit | Attending: Nephrology | Admitting: Nephrology

## 2015-12-28 DIAGNOSIS — D631 Anemia in chronic kidney disease: Secondary | ICD-10-CM | POA: Diagnosis not present

## 2015-12-28 DIAGNOSIS — Z79899 Other long term (current) drug therapy: Secondary | ICD-10-CM | POA: Diagnosis not present

## 2015-12-28 DIAGNOSIS — N189 Chronic kidney disease, unspecified: Principal | ICD-10-CM

## 2015-12-28 DIAGNOSIS — N183 Chronic kidney disease, stage 3 (moderate): Secondary | ICD-10-CM | POA: Diagnosis not present

## 2015-12-28 DIAGNOSIS — Z5181 Encounter for therapeutic drug level monitoring: Secondary | ICD-10-CM | POA: Diagnosis not present

## 2015-12-28 LAB — IRON AND TIBC
Iron: 66 ug/dL (ref 28–170)
SATURATION RATIOS: 29 % (ref 10.4–31.8)
TIBC: 225 ug/dL — AB (ref 250–450)
UIBC: 159 ug/dL

## 2015-12-28 LAB — POCT HEMOGLOBIN-HEMACUE: HEMOGLOBIN: 11.3 g/dL — AB (ref 12.0–15.0)

## 2015-12-28 LAB — FERRITIN: FERRITIN: 362 ng/mL — AB (ref 11–307)

## 2015-12-28 MED ORDER — EPOETIN ALFA 10000 UNIT/ML IJ SOLN
INTRAMUSCULAR | Status: AC
  Administered 2015-12-28: 10000 [IU] via SUBCUTANEOUS
  Filled 2015-12-28: qty 1

## 2015-12-28 MED ORDER — EPOETIN ALFA 20000 UNIT/ML IJ SOLN
INTRAMUSCULAR | Status: AC
  Administered 2015-12-28: 20000 [IU] via SUBCUTANEOUS
  Filled 2015-12-28: qty 1

## 2015-12-28 MED ORDER — EPOETIN ALFA 40000 UNIT/ML IJ SOLN: 30000.0000 [IU] | INTRAMUSCULAR | Status: DC

## 2016-01-04 ENCOUNTER — Other Ambulatory Visit: Payer: Medicare Other

## 2016-01-04 ENCOUNTER — Encounter: Payer: Self-pay | Admitting: Internal Medicine

## 2016-01-04 ENCOUNTER — Ambulatory Visit (INDEPENDENT_AMBULATORY_CARE_PROVIDER_SITE_OTHER): Payer: Medicare Other | Admitting: Internal Medicine

## 2016-01-04 VITALS — BP 158/72 | HR 84 | Temp 98.5°F | Resp 20 | Ht 66.0 in | Wt 235.0 lb

## 2016-01-04 DIAGNOSIS — I10 Essential (primary) hypertension: Secondary | ICD-10-CM | POA: Diagnosis not present

## 2016-01-04 DIAGNOSIS — R300 Dysuria: Secondary | ICD-10-CM | POA: Diagnosis not present

## 2016-01-04 LAB — POCT URINALYSIS DIPSTICK
Bilirubin, UA: NEGATIVE
Blood, UA: NEGATIVE
Glucose, UA: NEGATIVE
KETONES UA: NEGATIVE
Nitrite, UA: NEGATIVE
PH UA: 6
PROTEIN UA: NEGATIVE
UROBILINOGEN UA: NEGATIVE

## 2016-01-04 MED ORDER — ONDANSETRON HCL 4 MG PO TABS: 4.0000 mg | ORAL_TABLET | Freq: Three times a day (TID) | ORAL | 0 refills | Status: DC | PRN

## 2016-01-04 MED ORDER — CIPROFLOXACIN HCL 500 MG PO TABS: 500.0000 mg | ORAL_TABLET | Freq: Two times a day (BID) | ORAL | 0 refills | Status: DC

## 2016-01-04 NOTE — Progress Notes (Signed)
   Subjective:    Patient ID: [name], female    DOB: 1945-06-17, 69 y.o.   MRN: [id number]  HPI The patient is a 70 YO female coming in for possible UTI symptoms. She is having pain with urination for the last 5 days. She also had some sweats and started taking tylenol for that. She has had mild pressure with urination and when her bladder is full. She has not taken all of her blood pressure medicines yet today. Denies headaches or chest pains.   Review of Systems  Constitutional: Positive for activity change. Negative for appetite change, chills, fatigue, fever and unexpected weight change.  Respiratory: Negative for cough, chest tightness, shortness of breath and wheezing.   Cardiovascular: Negative for chest pain, palpitations and leg swelling.  Gastrointestinal: Negative for abdominal distention, abdominal pain, constipation and diarrhea.  Genitourinary: Positive for dysuria and urgency. Negative for flank pain and frequency.  Musculoskeletal: Positive for arthralgias, back pain and gait problem. Negative for joint swelling and myalgias.  Skin: Negative.       Objective:   Physical Exam  Constitutional: She is oriented to person, place, and time. She appears well-developed and well-nourished.  HENT:  Head: Normocephalic and atraumatic.  Eyes: EOM are normal.  Neck: Normal range of motion.  Cardiovascular: Normal rate and regular rhythm.   Pulmonary/Chest: Effort normal and breath sounds normal. No respiratory distress. She has no wheezes.  Abdominal: Soft. Bowel sounds are normal. She exhibits no distension. There is no tenderness. There is no rebound.  Neurological: She is alert and oriented to person, place, and time.   Vitals:   01/04/16 1049 01/04/16 1110  BP: (!) 196/90 (!) 158/72  Pulse: 84   Resp: 20   Temp: 98.5 F (36.9 C)   TempSrc: Oral   SpO2: 96%   Weight: 235 lb (106.6 kg)   Height: 5\' 6"  (1.676 m)       Assessment & Plan:  Pneumonia 13 given at  visit.

## 2016-01-04 NOTE — Progress Notes (Signed)
Pre visit review using our clinic review tool, if applicable. No additional management support is needed unless otherwise documented below in the visit note. 

## 2016-01-04 NOTE — Assessment & Plan Note (Signed)
U/a with signs of infection. Changing agents from last time with cipro. Sent for culture to verify sensitivity.

## 2016-01-04 NOTE — Patient Instructions (Signed)
We are sending in a medicine for nausea called zofran which you can use.   We are sending in an antibiotic called ciprofloxacin today for the bladder infection. Take 1 pill twice a day for 5 days.   We are sending it off for culture to make sure the medicine is right.

## 2016-01-04 NOTE — Assessment & Plan Note (Signed)
BP high and she has not taken all of her meds today. Better with recheck and she will take her meds when she gets home.

## 2016-01-07 LAB — URINE CULTURE

## 2016-01-11 ENCOUNTER — Encounter (HOSPITAL_COMMUNITY)
Admission: RE | Admit: 2016-01-11 | Discharge: 2016-01-11 | Disposition: A | Discharge status: Home / Self Care | Payer: Medicare Other | Source: Ambulatory Visit | Attending: Nephrology | Admitting: Nephrology

## 2016-01-11 DIAGNOSIS — N183 Chronic kidney disease, stage 3 unspecified: Secondary | ICD-10-CM

## 2016-01-11 DIAGNOSIS — D631 Anemia in chronic kidney disease: Secondary | ICD-10-CM | POA: Diagnosis not present

## 2016-01-11 DIAGNOSIS — Z5181 Encounter for therapeutic drug level monitoring: Secondary | ICD-10-CM | POA: Insufficient documentation

## 2016-01-11 DIAGNOSIS — Z79899 Other long term (current) drug therapy: Secondary | ICD-10-CM | POA: Diagnosis not present

## 2016-01-11 LAB — POCT HEMOGLOBIN-HEMACUE: Hemoglobin: 11.7 g/dL — ABNORMAL LOW (ref 12.0–15.0)

## 2016-01-11 MED ORDER — EPOETIN ALFA 40000 UNIT/ML IJ SOLN: 30000.0000 [IU] | INTRAMUSCULAR | Status: DC

## 2016-01-11 MED ORDER — EPOETIN ALFA 2000 UNIT/ML IJ SOLN
INTRAMUSCULAR | Status: AC
  Filled 2016-01-11: qty 1

## 2016-01-11 MED ORDER — EPOETIN ALFA 10000 UNIT/ML IJ SOLN
INTRAMUSCULAR | Status: AC
  Administered 2016-01-11: 10000 [IU] via SUBCUTANEOUS
  Filled 2016-01-11: qty 1

## 2016-01-11 MED ORDER — EPOETIN ALFA 20000 UNIT/ML IJ SOLN
INTRAMUSCULAR | Status: AC
  Administered 2016-01-11: 20000 [IU] via SUBCUTANEOUS
  Filled 2016-01-11: qty 1

## 2016-01-25 ENCOUNTER — Encounter (HOSPITAL_COMMUNITY)
Admission: RE | Admit: 2016-01-25 | Discharge: 2016-01-25 | Disposition: A | Discharge status: Home / Self Care | Payer: Medicare Other | Source: Ambulatory Visit | Attending: Nephrology | Admitting: Nephrology

## 2016-01-25 DIAGNOSIS — D631 Anemia in chronic kidney disease: Secondary | ICD-10-CM | POA: Diagnosis not present

## 2016-01-25 DIAGNOSIS — Z79899 Other long term (current) drug therapy: Secondary | ICD-10-CM | POA: Diagnosis not present

## 2016-01-25 DIAGNOSIS — N183 Chronic kidney disease, stage 3 unspecified: Secondary | ICD-10-CM

## 2016-01-25 DIAGNOSIS — Z5181 Encounter for therapeutic drug level monitoring: Secondary | ICD-10-CM | POA: Diagnosis not present

## 2016-01-25 LAB — POCT HEMOGLOBIN-HEMACUE: Hemoglobin: 11.1 g/dL — ABNORMAL LOW (ref 12.0–15.0)

## 2016-01-25 MED ORDER — EPOETIN ALFA 40000 UNIT/ML IJ SOLN: 30000.0000 [IU] | INTRAMUSCULAR | Status: DC

## 2016-01-25 MED ORDER — EPOETIN ALFA 20000 UNIT/ML IJ SOLN
INTRAMUSCULAR | Status: AC
  Administered 2016-01-25: 20000 [IU] via SUBCUTANEOUS
  Filled 2016-01-25: qty 1

## 2016-01-25 MED ORDER — EPOETIN ALFA 10000 UNIT/ML IJ SOLN
INTRAMUSCULAR | Status: AC
Start: 2016-01-25 — End: 2016-01-25
  Administered 2016-01-25: 10000 [IU] via SUBCUTANEOUS
  Filled 2016-01-25: qty 1

## 2016-01-25 MED ORDER — CLONIDINE HCL 0.1 MG PO TABS: 0.1000 mg | ORAL_TABLET | Freq: Once | ORAL | Status: DC | PRN

## 2016-01-26 DIAGNOSIS — Z23 Encounter for immunization: Secondary | ICD-10-CM | POA: Diagnosis not present

## 2016-01-26 DIAGNOSIS — I129 Hypertensive chronic kidney disease with stage 1 through stage 4 chronic kidney disease, or unspecified chronic kidney disease: Secondary | ICD-10-CM | POA: Diagnosis not present

## 2016-01-26 DIAGNOSIS — D631 Anemia in chronic kidney disease: Secondary | ICD-10-CM | POA: Diagnosis not present

## 2016-01-26 DIAGNOSIS — N184 Chronic kidney disease, stage 4 (severe): Secondary | ICD-10-CM | POA: Diagnosis not present

## 2016-01-26 DIAGNOSIS — N2581 Secondary hyperparathyroidism of renal origin: Secondary | ICD-10-CM | POA: Diagnosis not present

## 2016-01-26 DIAGNOSIS — N390 Urinary tract infection, site not specified: Secondary | ICD-10-CM | POA: Diagnosis not present

## 2016-02-08 ENCOUNTER — Encounter (HOSPITAL_COMMUNITY)
Admission: RE | Admit: 2016-02-08 | Discharge: 2016-02-08 | Disposition: A | Discharge status: Home / Self Care | Payer: Medicare Other | Source: Ambulatory Visit | Attending: Nephrology | Admitting: Nephrology

## 2016-02-08 DIAGNOSIS — N183 Chronic kidney disease, stage 3 (moderate): Secondary | ICD-10-CM | POA: Insufficient documentation

## 2016-02-08 DIAGNOSIS — Z5181 Encounter for therapeutic drug level monitoring: Secondary | ICD-10-CM | POA: Insufficient documentation

## 2016-02-08 DIAGNOSIS — D631 Anemia in chronic kidney disease: Secondary | ICD-10-CM | POA: Diagnosis not present

## 2016-02-08 DIAGNOSIS — Z79899 Other long term (current) drug therapy: Secondary | ICD-10-CM | POA: Insufficient documentation

## 2016-02-08 LAB — IRON AND TIBC
Iron: 56 ug/dL (ref 28–170)
Saturation Ratios: 24 % (ref 10.4–31.8)
TIBC: 237 ug/dL — ABNORMAL LOW (ref 250–450)
UIBC: 181 ug/dL

## 2016-02-08 LAB — FERRITIN: FERRITIN: 229 ng/mL (ref 11–307)

## 2016-02-08 MED ORDER — EPOETIN ALFA 20000 UNIT/ML IJ SOLN
INTRAMUSCULAR | Status: AC
  Administered 2016-02-08: 20000 [IU]
  Filled 2016-02-08: qty 1

## 2016-02-08 MED ORDER — EPOETIN ALFA 40000 UNIT/ML IJ SOLN: 30000.0000 [IU] | INTRAMUSCULAR | Status: DC

## 2016-02-08 MED ORDER — EPOETIN ALFA 10000 UNIT/ML IJ SOLN
INTRAMUSCULAR | Status: AC
  Administered 2016-02-08: 10000 [IU]
  Filled 2016-02-08: qty 1

## 2016-02-09 LAB — POCT HEMOGLOBIN-HEMACUE: HEMOGLOBIN: 11.3 g/dL — AB (ref 12.0–15.0)

## 2016-02-13 DIAGNOSIS — N184 Chronic kidney disease, stage 4 (severe): Secondary | ICD-10-CM | POA: Diagnosis not present

## 2016-02-22 ENCOUNTER — Inpatient Hospital Stay (HOSPITAL_COMMUNITY): Admission: RE | Admit: 2016-02-22 | Payer: Medicare Other | Source: Ambulatory Visit

## 2016-02-28 ENCOUNTER — Encounter (HOSPITAL_COMMUNITY): Payer: Medicare Other

## 2016-03-06 ENCOUNTER — Other Ambulatory Visit (HOSPITAL_COMMUNITY): Payer: Self-pay | Admitting: *Deleted

## 2016-03-07 ENCOUNTER — Encounter (HOSPITAL_COMMUNITY)
Admission: RE | Admit: 2016-03-07 | Discharge: 2016-03-07 | Disposition: A | Discharge status: Home / Self Care | Payer: Medicare Other | Source: Ambulatory Visit | Attending: Nephrology | Admitting: Nephrology

## 2016-03-07 DIAGNOSIS — D631 Anemia in chronic kidney disease: Secondary | ICD-10-CM

## 2016-03-07 DIAGNOSIS — N183 Chronic kidney disease, stage 3 unspecified: Secondary | ICD-10-CM

## 2016-03-07 DIAGNOSIS — Z5181 Encounter for therapeutic drug level monitoring: Secondary | ICD-10-CM | POA: Diagnosis not present

## 2016-03-07 DIAGNOSIS — Z79899 Other long term (current) drug therapy: Secondary | ICD-10-CM | POA: Diagnosis not present

## 2016-03-07 LAB — POCT HEMOGLOBIN-HEMACUE: HEMOGLOBIN: 10.6 g/dL — AB (ref 12.0–15.0)

## 2016-03-07 MED ORDER — EPOETIN ALFA 10000 UNIT/ML IJ SOLN
INTRAMUSCULAR | Status: AC
  Administered 2016-03-07: 10000 [IU] via SUBCUTANEOUS
  Filled 2016-03-07: qty 1

## 2016-03-07 MED ORDER — SODIUM CHLORIDE 0.9 % IV SOLN
510.0000 mg | Freq: Once | INTRAVENOUS | Status: AC
  Administered 2016-03-07: 510 mg via INTRAVENOUS
  Filled 2016-03-07: qty 17

## 2016-03-07 MED ORDER — EPOETIN ALFA 40000 UNIT/ML IJ SOLN: 30000.0000 [IU] | INTRAMUSCULAR | Status: DC

## 2016-03-07 MED ORDER — EPOETIN ALFA 20000 UNIT/ML IJ SOLN
INTRAMUSCULAR | Status: AC
  Administered 2016-03-07: 20000 [IU] via SUBCUTANEOUS
  Filled 2016-03-07: qty 1

## 2016-03-13 ENCOUNTER — Telehealth: Payer: Self-pay | Admitting: Internal Medicine

## 2016-03-13 NOTE — Telephone Encounter (Signed)
Called patient to schedule annual wellness appt. Patient did not answer. Left vm for pt to call office to schedule appt.

## 2016-03-14 ENCOUNTER — Telehealth: Payer: Self-pay | Admitting: Internal Medicine

## 2016-03-14 NOTE — Telephone Encounter (Signed)
Spoke with patient today. Ms. [name] wanted to know if she could have the generic form of the clobetasol ointment prescribed. Patient would like for you to give her a call.

## 2016-03-14 NOTE — Telephone Encounter (Signed)
Patient stated that she is not interested in scheduling annual wellness visit at this time.

## 2016-03-14 NOTE — Telephone Encounter (Signed)
The pharmacy should be able to give her the generic. She does not need a separate prescription.

## 2016-03-14 NOTE — Telephone Encounter (Signed)
Patient aware.

## 2016-03-19 DIAGNOSIS — N184 Chronic kidney disease, stage 4 (severe): Secondary | ICD-10-CM | POA: Diagnosis not present

## 2016-03-21 ENCOUNTER — Inpatient Hospital Stay (HOSPITAL_COMMUNITY): Admission: RE | Admit: 2016-03-21 | Payer: Medicare Other | Source: Ambulatory Visit

## 2016-03-21 DIAGNOSIS — D631 Anemia in chronic kidney disease: Secondary | ICD-10-CM | POA: Diagnosis not present

## 2016-03-28 DIAGNOSIS — I129 Hypertensive chronic kidney disease with stage 1 through stage 4 chronic kidney disease, or unspecified chronic kidney disease: Secondary | ICD-10-CM | POA: Diagnosis not present

## 2016-04-18 DIAGNOSIS — N390 Urinary tract infection, site not specified: Secondary | ICD-10-CM | POA: Diagnosis not present

## 2016-04-18 DIAGNOSIS — N2581 Secondary hyperparathyroidism of renal origin: Secondary | ICD-10-CM | POA: Diagnosis not present

## 2016-04-18 DIAGNOSIS — D631 Anemia in chronic kidney disease: Secondary | ICD-10-CM | POA: Diagnosis not present

## 2016-04-18 DIAGNOSIS — M1990 Unspecified osteoarthritis, unspecified site: Secondary | ICD-10-CM | POA: Diagnosis not present

## 2016-04-18 DIAGNOSIS — N189 Chronic kidney disease, unspecified: Secondary | ICD-10-CM | POA: Diagnosis not present

## 2016-04-18 DIAGNOSIS — I129 Hypertensive chronic kidney disease with stage 1 through stage 4 chronic kidney disease, or unspecified chronic kidney disease: Secondary | ICD-10-CM | POA: Diagnosis not present

## 2016-04-18 DIAGNOSIS — N184 Chronic kidney disease, stage 4 (severe): Secondary | ICD-10-CM | POA: Diagnosis not present

## 2016-05-08 ENCOUNTER — Other Ambulatory Visit (HOSPITAL_COMMUNITY): Payer: Self-pay | Admitting: *Deleted

## 2016-05-09 ENCOUNTER — Ambulatory Visit (HOSPITAL_COMMUNITY)
Admission: RE | Admit: 2016-05-09 | Discharge: 2016-05-09 | Disposition: A | Discharge status: Home / Self Care | Payer: Medicare Other | Source: Ambulatory Visit | Attending: Nephrology | Admitting: Nephrology

## 2016-05-09 DIAGNOSIS — D509 Iron deficiency anemia, unspecified: Secondary | ICD-10-CM | POA: Insufficient documentation

## 2016-05-09 MED ORDER — SODIUM CHLORIDE 0.9 % IV SOLN
510.0000 mg | Freq: Once | INTRAVENOUS | Status: AC
  Administered 2016-05-09: 510 mg via INTRAVENOUS
  Filled 2016-05-09: qty 17

## 2016-05-16 DIAGNOSIS — D631 Anemia in chronic kidney disease: Secondary | ICD-10-CM | POA: Diagnosis not present

## 2016-06-13 DIAGNOSIS — D631 Anemia in chronic kidney disease: Secondary | ICD-10-CM | POA: Diagnosis not present

## 2016-07-15 DIAGNOSIS — D631 Anemia in chronic kidney disease: Secondary | ICD-10-CM | POA: Diagnosis not present

## 2016-08-02 DIAGNOSIS — D631 Anemia in chronic kidney disease: Secondary | ICD-10-CM | POA: Diagnosis not present

## 2016-08-02 DIAGNOSIS — N184 Chronic kidney disease, stage 4 (severe): Secondary | ICD-10-CM | POA: Diagnosis not present

## 2016-08-02 DIAGNOSIS — I129 Hypertensive chronic kidney disease with stage 1 through stage 4 chronic kidney disease, or unspecified chronic kidney disease: Secondary | ICD-10-CM | POA: Diagnosis not present

## 2016-08-02 DIAGNOSIS — N2581 Secondary hyperparathyroidism of renal origin: Secondary | ICD-10-CM | POA: Diagnosis not present

## 2016-08-02 DIAGNOSIS — E8770 Fluid overload, unspecified: Secondary | ICD-10-CM | POA: Diagnosis not present

## 2016-08-04 ENCOUNTER — Encounter (HOSPITAL_COMMUNITY): Payer: Self-pay

## 2016-08-04 ENCOUNTER — Emergency Department (HOSPITAL_COMMUNITY)
Admission: EM | Admit: 2016-08-04 | Discharge: 2016-08-04 | Disposition: A | Discharge status: Home / Self Care | Payer: Medicare Other | Attending: Emergency Medicine | Admitting: Emergency Medicine

## 2016-08-04 DIAGNOSIS — Z87891 Personal history of nicotine dependence: Secondary | ICD-10-CM | POA: Insufficient documentation

## 2016-08-04 DIAGNOSIS — I130 Hypertensive heart and chronic kidney disease with heart failure and stage 1 through stage 4 chronic kidney disease, or unspecified chronic kidney disease: Secondary | ICD-10-CM | POA: Insufficient documentation

## 2016-08-04 DIAGNOSIS — N183 Chronic kidney disease, stage 3 (moderate): Secondary | ICD-10-CM | POA: Insufficient documentation

## 2016-08-04 DIAGNOSIS — M5410 Radiculopathy, site unspecified: Secondary | ICD-10-CM | POA: Diagnosis not present

## 2016-08-04 DIAGNOSIS — I5030 Unspecified diastolic (congestive) heart failure: Secondary | ICD-10-CM | POA: Insufficient documentation

## 2016-08-04 DIAGNOSIS — M792 Neuralgia and neuritis, unspecified: Secondary | ICD-10-CM

## 2016-08-04 DIAGNOSIS — M79602 Pain in left arm: Secondary | ICD-10-CM | POA: Diagnosis not present

## 2016-08-04 LAB — CBC
HEMATOCRIT: 36.3 % (ref 36.0–46.0)
Hemoglobin: 11.7 g/dL — ABNORMAL LOW (ref 12.0–15.0)
MCH: 31 pg (ref 26.0–34.0)
MCHC: 32.2 g/dL (ref 30.0–36.0)
MCV: 96.3 fL (ref 78.0–100.0)
PLATELETS: 224 10*3/uL (ref 150–400)
RBC: 3.77 MIL/uL — ABNORMAL LOW (ref 3.87–5.11)
RDW: 15.2 % (ref 11.5–15.5)
WBC: 7.5 10*3/uL (ref 4.0–10.5)

## 2016-08-04 LAB — BASIC METABOLIC PANEL
ANION GAP: 9 (ref 5–15)
BUN: 38 mg/dL — ABNORMAL HIGH (ref 6–20)
CALCIUM: 9.6 mg/dL (ref 8.9–10.3)
CO2: 27 mmol/L (ref 22–32)
Chloride: 103 mmol/L (ref 101–111)
Creatinine, Ser: 2.33 mg/dL — ABNORMAL HIGH (ref 0.44–1.00)
GFR, EST AFRICAN AMERICAN: 23 mL/min — AB (ref >60)
GFR, EST NON AFRICAN AMERICAN: 20 mL/min — AB (ref >60)
Glucose, Bld: 113 mg/dL — ABNORMAL HIGH (ref 65–99)
Potassium: 4.2 mmol/L (ref 3.5–5.1)
Sodium: 139 mmol/L (ref 135–145)

## 2016-08-04 MED ORDER — OXYCODONE-ACETAMINOPHEN 5-325 MG PO TABS: 1.0000 | ORAL_TABLET | Freq: Four times a day (QID) | ORAL | 0 refills | Status: DC | PRN

## 2016-08-04 NOTE — ED Triage Notes (Signed)
Pt states she has left ruptured disc.  Pt states MD wants to do surgical intervention.  Pt states nerve/burning pain on left head, neck, shoulder, arm and left side.  No new injury noted.  Comes and goes.

## 2016-08-04 NOTE — Discharge Instructions (Signed)
Return to the ED with any concerns including weakness of arms or legs, chest pain, difficulty breathing, fainting, changes in vision or speech, abdominal pain, decreased level of alertness/lethargy, or any other alarming symptoms

## 2016-08-04 NOTE — ED Provider Notes (Signed)
[facility] DEPT Provider Note   CSN: [id number] Arrival date & time: 08/04/16  1726     History   Chief Complaint Chief Complaint  Patient presents with  . Shoulder Pain  . Blood In Stools  . burning sensation in face and neck    HPI [name] is a 71 y.o. female.  HPI  Pt presenting with c/o pain in left neck with burning sensation going down her left arm and into left side of scalp.  She states she was supposed to have surgery with Dr. Joya Salm but got a letter stating that he no longer saw medicare patients.  No weakness of arms or legs.  No chest pain or shortness of breath.  No changes in vision or speech.  She has had similar pain in the past, but today the pain is bothering her more than usual.  No injury.  Worse pain with turning her head to the side.    Past Medical History:  Diagnosis Date  . Adenomatous colon polyp 01/2002  . Anemia   . CKD (chronic kidney disease), stage III   . Diverticulosis of colon   . DJD (degenerative joint disease)   . Hemorrhoids   . Hemorrhoids   . Hypertension   . Lumbar back pain   . Neuropathy   . Obesity   . PONV (postoperative nausea and vomiting)   . Renal cyst   . Shortness of breath dyspnea    with exertion  . Urinary tract infection   . Venous insufficiency   . Vitamin D deficiency     Patient Active Problem List   Diagnosis Date Noted  . Anemia in chronic kidney disease 11/02/2015  . Dysuria 09/24/2014  . CKD (chronic kidney disease) stage 3, GFR 30-59 ml/min 04/26/2014  . Diastolic congestive heart failure ([place]) 04/26/2014  . Knee pain, bilateral 03/23/2012  . Murmur 02/24/2012  . VITAMIN D DEFICIENCY 04/13/2008  . OBESITY 02/23/2008  . NEUROPATHY 02/23/2008  . Essential hypertension 02/23/2008  . Venous (peripheral) insufficiency 02/23/2008  . BACK PAIN, LUMBAR 02/23/2008    Past Surgical History:  Procedure Laterality Date  . ABDOMINAL HYSTERECTOMY    . COLONOSCOPY      OB History    No data  available       Home Medications    Prior to Admission medications   Medication Sig Start Date End Date Taking? Authorizing Provider  acetaminophen-codeine (TYLENOL #3) 300-30 MG tablet Take 1 tablet by mouth 2 (two) times daily. 07/15/16  Yes Historical Provider, MD  amLODipine (NORVASC) 10 MG tablet Take 1 tablet (10 mg total) by mouth daily. 05/01/14  Yes Velvet Bathe, MD  calcitRIOL (ROCALTROL) 0.25 MCG capsule Take 0.5 mcg by mouth daily. 06/14/15  Yes Historical Provider, MD  clobetasol ointment (TEMOVATE) 1.28 % Apply 1 application topically daily as needed (itching). Reported on 04/27/2015 04/27/15  Yes Hoyt Koch, MD  gabapentin (NEURONTIN) 300 MG capsule Take 1 capsule (300 mg total) by mouth 3 (three) times daily as needed (pain). 12/08/15  Yes Hoyt Koch, MD  hydrALAZINE (APRESOLINE) 50 MG tablet TAKE ONE TABLET BY MOUTH THREE TIMES DAILY 05/03/15  Yes Hoyt Koch, MD  hydrocortisone (ANUSOL-HC) 2.5 % rectal cream Place 1 application rectally as needed. Patient taking differently: Place 1 application rectally as needed for hemorrhoids.  05/24/13  Yes Noralee Space, MD  labetalol (NORMODYNE) 200 MG tablet TAKE 2 TABLETS BY MOUTH TWICE DAILY   Yes Noralee Space, MD  losartan (COZAAR) 100 MG tablet TAKE 1 TABLET BY MOUTH EVERY DAY 02/27/12  Yes Noralee Space, MD  ondansetron (ZOFRAN) 4 MG tablet Take 1 tablet (4 mg total) by mouth every 8 (eight) hours as needed for nausea or vomiting. 01/04/16  Yes Hoyt Koch, MD  PROCRIT 58099 UNIT/ML injection Inject 40,000 Units into the skin every 14 (fourteen) days. 06/26/16  Yes Historical Provider, MD  Propylene Glycol (SYSTANE BALANCE) 0.6 % SOLN Place 1 drop into both eyes daily as needed (for dry eyes).   Yes Historical Provider, MD  spironolactone (ALDACTONE) 25 MG tablet Take 1 tablet (25 mg total) by mouth daily. 03/23/12  Yes Noralee Space, MD  torsemide (DEMADEX) 20 MG tablet TAKE 2 TABLETS BY MOUTH EVERY  MORNING 01/18/13  Yes Noralee Space, MD  ciprofloxacin (CIPRO) 500 MG tablet Take 1 tablet (500 mg total) by mouth 2 (two) times daily. Patient not taking: Reported on 08/04/2016 01/04/16   Hoyt Koch, MD  clindamycin (CLEOCIN) 150 MG capsule Take 150 mg by mouth 4 (four) times daily. 07/25/16   Historical Provider, MD  diclofenac sodium (VOLTAREN) 1 % GEL Apply 2 g topically 3 (three) times daily as needed. Patient not taking: Reported on 08/04/2016 05/25/14   Hoyt Koch, MD  oxyCODONE-acetaminophen (PERCOCET/ROXICET) 5-325 MG tablet Take 1-2 tablets by mouth every 6 (six) hours as needed for severe pain. 08/04/16   Alfonzo Beers, MD  potassium chloride SA (K-DUR,KLOR-CON) 20 MEQ tablet TAKE 1 TABLET BY MOUTH DAILY Patient not taking: Reported on 08/04/2016 01/18/13   Noralee Space, MD  traMADol (ULTRAM) 50 MG tablet Take 1 tablet (50 mg total) by mouth 3 (three) times daily as needed. Patient not taking: Reported on 08/04/2016 05/25/14   Hoyt Koch, MD    Family History Family History  Problem Relation Age of Onset  . Diabetes Mother   . Colon polyps Mother   . Hypertension Mother   . Hypertension Brother   . Hypertension Sister   . Colon cancer Neg Hx     Social History Social History  Substance Use Topics  . Smoking status: Former Smoker    Packs/day: 0.40    Years: 15.00    Types: Cigarettes    Quit date: 04/08/1982  . Smokeless tobacco: Never Used  . Alcohol use No     Allergies   Minoxidil and Naproxen   Review of Systems Review of Systems  ROS reviewed and all otherwise negative except for mentioned in HPI   Physical Exam Updated Vital Signs BP (!) 184/85   Pulse 78   Temp 98 F (36.7 C) (Oral)   Resp 18   Ht 5\' 6"  (1.676 m)   Wt 104.3 kg   SpO2 95%   BMI 37.12 kg/m  Vitals reviewed Physical Exam Physical Examination: General appearance - alert, well appearing, and in no distress Mental status - alert, oriented to person, place,  and time Eyes - no conjunctival injection, no scleral icterus Neck - supple, no significant adenopathy, ttp over left paracervical spinal muscles, no midline tenderness to palpation Chest - clear to auscultation, no wheezes, rales or rhonchi, symmetric air entry Heart - normal rate, regular rhythm, normal S1, S2, no murmurs, rubs, clicks or gallops [place]- soft, nontender, nondistended Back exam - no midline tenderness to palpation to thoracic or lumbar spine, no CVA tenderness Neurological - alert, oriented, normal speech, strength 55/ in upper extremities bilaterally, 2+radial pulses Musculoskeletal - no joint tenderness,  deformity or swelling Extremities - peripheral pulses normal, no pedal edema, no clubbing or cyanosis Skin - normal coloration and turgor, no rashes  ED Treatments / Results  Labs (all labs ordered are listed, but only abnormal results are displayed) Labs Reviewed  CBC - Abnormal; Notable for the following:       Result Value   RBC 3.77 (*)    Hemoglobin 11.7 (*)    All other components within normal limits  BASIC METABOLIC PANEL - Abnormal; Notable for the following:    Glucose, Bld 113 (*)    BUN 38 (*)    Creatinine, Ser 2.33 (*)    GFR calc non Af Amer 20 (*)    GFR calc Af Amer 23 (*)    All other components within normal limits    EKG  EKG Interpretation None       Radiology No results found.  Procedures Procedures (including critical care time)  Medications Ordered in ED Medications - No data to display   Initial Impression / Assessment and Plan / ED Course  I have reviewed the triage vital signs and the nursing notes.  Pertinent labs & imaging results that were available during my care of the patient were reviewed by me and considered in my medical decision making (see chart for details).     Pt presenting with c/o burning pain in left arm coming from left side of neck.  Pain is chronic in nature but worse than usual.  Pt is  neurologically intact, doubt stroke.  Doubt ACS, pain is neuropathic/radicular in nature.  Pt has seen Dr. Joya Salm, neurosurgery in the past for this and plans to followup with him.  Discharged with strict return precautions.  Pt agreeable with plan.  Final Clinical Impressions(s) / ED Diagnoses   Final diagnoses:  Radicular pain in left arm    New Prescriptions Discharge Medication List as of 08/04/2016 10:22 PM    START taking these medications   Details  oxyCODONE-acetaminophen (PERCOCET/ROXICET) 5-325 MG tablet Take 1-2 tablets by mouth every 6 (six) hours as needed for severe pain., Starting Sun 08/04/2016, Print         Alfonzo Beers, MD 08/05/16 [phone]

## 2016-08-04 NOTE — ED Notes (Signed)
Patient c/o ongoing intermittent burning sensation in left side of face down to ear and down left side of neck.  Patient reports history of ruptured disc and nerves are causing sensations. Patient reports started again last Sunday and then again today.

## 2016-08-06 ENCOUNTER — Other Ambulatory Visit: Payer: Self-pay | Admitting: Internal Medicine

## 2016-08-07 DIAGNOSIS — M5021 Other cervical disc displacement,  high cervical region: Secondary | ICD-10-CM | POA: Diagnosis not present

## 2016-08-14 ENCOUNTER — Emergency Department (HOSPITAL_COMMUNITY)
Admission: EM | Admit: 2016-08-14 | Discharge: 2016-08-14 | Disposition: A | Discharge status: Home / Self Care | Payer: Medicare Other | Attending: Emergency Medicine | Admitting: Emergency Medicine

## 2016-08-14 DIAGNOSIS — H9312 Tinnitus, left ear: Secondary | ICD-10-CM | POA: Insufficient documentation

## 2016-08-14 DIAGNOSIS — N183 Chronic kidney disease, stage 3 (moderate): Secondary | ICD-10-CM | POA: Diagnosis not present

## 2016-08-14 DIAGNOSIS — Z87891 Personal history of nicotine dependence: Secondary | ICD-10-CM | POA: Diagnosis not present

## 2016-08-14 DIAGNOSIS — Z79899 Other long term (current) drug therapy: Secondary | ICD-10-CM | POA: Insufficient documentation

## 2016-08-14 DIAGNOSIS — I130 Hypertensive heart and chronic kidney disease with heart failure and stage 1 through stage 4 chronic kidney disease, or unspecified chronic kidney disease: Secondary | ICD-10-CM | POA: Diagnosis not present

## 2016-08-14 DIAGNOSIS — H9313 Tinnitus, bilateral: Secondary | ICD-10-CM | POA: Diagnosis not present

## 2016-08-14 DIAGNOSIS — I5030 Unspecified diastolic (congestive) heart failure: Secondary | ICD-10-CM | POA: Diagnosis not present

## 2016-08-14 DIAGNOSIS — H9113 Presbycusis, bilateral: Secondary | ICD-10-CM | POA: Diagnosis not present

## 2016-08-14 DIAGNOSIS — I10 Essential (primary) hypertension: Secondary | ICD-10-CM | POA: Diagnosis not present

## 2016-08-14 LAB — I-STAT CHEM 8, ED
BUN: 47 mg/dL — ABNORMAL HIGH (ref 6–20)
CALCIUM ION: 1.28 mmol/L (ref 1.15–1.40)
CREATININE: 2.7 mg/dL — AB (ref 0.44–1.00)
Chloride: 101 mmol/L (ref 101–111)
GLUCOSE: 122 mg/dL — AB (ref 65–99)
HCT: 35 % — ABNORMAL LOW (ref 36.0–46.0)
HEMOGLOBIN: 11.9 g/dL — AB (ref 12.0–15.0)
Potassium: 4.5 mmol/L (ref 3.5–5.1)
Sodium: 137 mmol/L (ref 135–145)
TCO2: 28 mmol/L (ref 0–100)

## 2016-08-14 LAB — SALICYLATE LEVEL: Salicylate Lvl: <7 mg/dL (ref 2.8–30.0)

## 2016-08-14 NOTE — ED Triage Notes (Signed)
Pt c/o  "buzzing and whooshing sound in ear".  c/o burning and tingling on side of face for 1 year. Pt denies trouble hearing.

## 2016-08-14 NOTE — ED Provider Notes (Signed)
[facility] DEPT Provider Note   CSN: [id number] Arrival date & time: 08/14/16  0245     History   Chief Complaint No chief complaint on file.   HPI [name] is a 71 y.o. female.  HPI  71 year old female presents with a chief complaint of tinnitus. She states his been going on for about 2 days. It is in her left ear and feels like a buzzing. There is no waxing and waning or pulsatile symptom. There is no ear pain and she states there is no hearing loss. She has chronic left facial, left neck, radiating down to her left arm numbness/burning/pain. This is been for over one year. She's been told she has a pinched nerve. None of the symptoms are better or worse today. There is no dizziness or weakness in her extremities. She takes a baby aspirin every day but denies any increased aspirin use. No other changes in her medicines.  Past Medical History:  Diagnosis Date  . Adenomatous colon polyp 01/2002  . Anemia   . CKD (chronic kidney disease), stage III   . Diverticulosis of colon   . DJD (degenerative joint disease)   . Hemorrhoids   . Hemorrhoids   . Hypertension   . Lumbar back pain   . Neuropathy   . Obesity   . PONV (postoperative nausea and vomiting)   . Renal cyst   . Shortness of breath dyspnea    with exertion  . Urinary tract infection   . Venous insufficiency   . Vitamin D deficiency     Patient Active Problem List   Diagnosis Date Noted  . Anemia in chronic kidney disease 11/02/2015  . Dysuria 09/24/2014  . CKD (chronic kidney disease) stage 3, GFR 30-59 ml/min 04/26/2014  . Diastolic congestive heart failure ([place]) 04/26/2014  . Knee pain, bilateral 03/23/2012  . Murmur 02/24/2012  . VITAMIN D DEFICIENCY 04/13/2008  . OBESITY 02/23/2008  . NEUROPATHY 02/23/2008  . Essential hypertension 02/23/2008  . Venous (peripheral) insufficiency 02/23/2008  . BACK PAIN, LUMBAR 02/23/2008    Past Surgical History:  Procedure Laterality Date  . ABDOMINAL  HYSTERECTOMY    . COLONOSCOPY      OB History    No data available       Home Medications    Prior to Admission medications   Medication Sig Start Date End Date Taking? Authorizing Provider  acetaminophen-codeine (TYLENOL #3) 300-30 MG tablet Take 1 tablet by mouth 2 (two) times daily. 07/15/16   [provider]  amLODipine (NORVASC) 10 MG tablet Take 1 tablet (10 mg total) by mouth daily. 05/01/14   Velvet Bathe, MD  calcitRIOL (ROCALTROL) 0.25 MCG capsule Take 0.5 mcg by mouth daily. 06/14/15   [provider]  ciprofloxacin (CIPRO) 500 MG tablet Take 1 tablet (500 mg total) by mouth 2 (two) times daily. Patient not taking: Reported on 08/04/2016 01/04/16   Hoyt Koch, MD  clindamycin (CLEOCIN) 150 MG capsule Take 150 mg by mouth 4 (four) times daily. 07/25/16   [provider]  clobetasol ointment (TEMOVATE) 3.50 % Apply 1 application topically daily as needed (itching). Reported on 04/27/2015 04/27/15   Hoyt Koch, MD  diclofenac sodium (VOLTAREN) 1 % GEL Apply 2 g topically 3 (three) times daily as needed. Patient not taking: Reported on 08/04/2016 05/25/14   Hoyt Koch, MD  gabapentin (NEURONTIN) 300 MG capsule Take 1 capsule (300 mg total) by mouth 3 (three) times daily as needed (pain). 12/08/15  Hoyt Koch, MD  hydrALAZINE (APRESOLINE) 50 MG tablet TAKE 1 TABLET BY MOUTH THREE TIMES DAILY 08/06/16   Hoyt Koch, MD  hydrocortisone (ANUSOL-HC) 2.5 % rectal cream Place 1 application rectally as needed. Patient taking differently: Place 1 application rectally as needed for hemorrhoids.  05/24/13   Noralee Space, MD  labetalol (NORMODYNE) 200 MG tablet TAKE 2 TABLETS BY MOUTH TWICE DAILY    Noralee Space, MD  losartan (COZAAR) 100 MG tablet TAKE 1 TABLET BY MOUTH EVERY DAY 02/27/12   Noralee Space, MD  ondansetron (ZOFRAN) 4 MG tablet Take 1 tablet (4 mg total) by mouth every 8 (eight) hours as needed for nausea or  vomiting. 01/04/16   Hoyt Koch, MD  oxyCODONE-acetaminophen (PERCOCET/ROXICET) 5-325 MG tablet Take 1-2 tablets by mouth every 6 (six) hours as needed for severe pain. 08/04/16   Alfonzo Beers, MD  potassium chloride SA (K-DUR,KLOR-CON) 20 MEQ tablet TAKE 1 TABLET BY MOUTH DAILY Patient not taking: Reported on 08/04/2016 01/18/13   Noralee Space, MD  PROCRIT 37106 UNIT/ML injection Inject 40,000 Units into the skin every 14 (fourteen) days. 06/26/16   [provider]  Propylene Glycol (SYSTANE BALANCE) 0.6 % SOLN Place 1 drop into both eyes daily as needed (for dry eyes).    [provider]  spironolactone (ALDACTONE) 25 MG tablet Take 1 tablet (25 mg total) by mouth daily. 03/23/12   Noralee Space, MD  torsemide (DEMADEX) 20 MG tablet TAKE 2 TABLETS BY MOUTH EVERY MORNING 01/18/13   Noralee Space, MD  traMADol (ULTRAM) 50 MG tablet Take 1 tablet (50 mg total) by mouth 3 (three) times daily as needed. Patient not taking: Reported on 08/04/2016 05/25/14   Hoyt Koch, MD    Family History Family History  Problem Relation Age of Onset  . Diabetes Mother   . Colon polyps Mother   . Hypertension Mother   . Hypertension Brother   . Hypertension Sister   . Colon cancer Neg Hx     Social History Social History  Substance Use Topics  . Smoking status: Former Smoker    Packs/day: 0.40    Years: 15.00    Types: Cigarettes    Quit date: 04/08/1982  . Smokeless tobacco: Never Used  . Alcohol use No     Allergies   Minoxidil and Naproxen   Review of Systems Review of Systems  HENT: Positive for tinnitus. Negative for ear pain and hearing loss.   Musculoskeletal: Positive for neck pain (Chronic).  Neurological: Negative for weakness, numbness and headaches.  All other systems reviewed and are negative.    Physical Exam Updated Vital Signs BP (!) 163/80 (BP Location: Left Arm)   Pulse 76   Resp 18   SpO2 96%   Physical Exam    Constitutional: She is oriented to person, place, and time. She appears well-developed and well-nourished.  HENT:  Head: Normocephalic and atraumatic.  Right Ear: Tympanic membrane, external ear and ear canal normal.  Left Ear: Tympanic membrane, external ear and ear canal normal.  Nose: Nose normal.  Eyes: EOM are normal. Pupils are equal, round, and reactive to light. Right eye exhibits no discharge. Left eye exhibits no discharge.  Neck: Neck supple. Carotid bruit is not present.  Cardiovascular: Normal rate, regular rhythm and normal heart sounds.   Pulmonary/Chest: Effort normal and breath sounds normal.  Neurological: She is alert and oriented to person, place, and time.  CN 3-12  grossly intact. 5/5 strength in all 4 extremities. Grossly normal sensation.  Skin: Skin is warm and dry.  Nursing note and vitals reviewed.    ED Treatments / Results  Labs (all labs ordered are listed, but only abnormal results are displayed) Labs Reviewed  I-STAT CHEM 8, ED - Abnormal; Notable for the following:       Result Value   BUN 47 (*)    Creatinine, Ser 2.70 (*)    Glucose, Bld 122 (*)    Hemoglobin 11.9 (*)    HCT 35.0 (*)    All other components within normal limits  SALICYLATE LEVEL    EKG  EKG Interpretation None       Radiology No results found.  Procedures Procedures (including critical care time)  Medications Ordered in ED Medications - No data to display   Initial Impression / Assessment and Plan / ED Course  I have reviewed the triage vital signs and the nursing notes.  Pertinent labs & imaging results that were available during my care of the patient were reviewed by me and considered in my medical decision making (see chart for details).     Overall appears well. I doubt this is vascular. Neuro exam unremarkable. D/w Dr. Erik Obey, advises to hold on steroids as he can see this AM in clinic for further testing. Ear exam unremarkable, no obvious  effusion.  Final Clinical Impressions(s) / ED Diagnoses   Final diagnoses:  Tinnitus of left ear    New Prescriptions Discharge Medication List as of 08/14/2016  7:42 AM       Sherwood Gambler, MD 08/14/16 [phone]

## 2016-08-15 ENCOUNTER — Ambulatory Visit (INDEPENDENT_AMBULATORY_CARE_PROVIDER_SITE_OTHER): Payer: Medicare Other | Admitting: Internal Medicine

## 2016-08-15 ENCOUNTER — Encounter: Payer: Self-pay | Admitting: Internal Medicine

## 2016-08-15 DIAGNOSIS — M5412 Radiculopathy, cervical region: Secondary | ICD-10-CM | POA: Diagnosis not present

## 2016-08-15 DIAGNOSIS — N183 Chronic kidney disease, stage 3 unspecified: Secondary | ICD-10-CM

## 2016-08-15 DIAGNOSIS — D631 Anemia in chronic kidney disease: Secondary | ICD-10-CM

## 2016-08-15 DIAGNOSIS — H9312 Tinnitus, left ear: Secondary | ICD-10-CM

## 2016-08-15 MED ORDER — TRIAMCINOLONE ACETONIDE 0.5 % EX OINT: 1.0000 "application " | TOPICAL_OINTMENT | Freq: Two times a day (BID) | CUTANEOUS | 3 refills | Status: DC

## 2016-08-15 NOTE — Progress Notes (Signed)
   Subjective:    Patient ID: [name], female    DOB: 1945-09-28, 71 y.o.   MRN: [id number]  HPI The patient is a 71 YO female coming in for follow up of several ER visits (one for tinnitus in her left ear, saw ENT afterwards and they found moderate hearing loss but no acute cause for the tinnitus, she denies allergy symptoms or loud noise exposure, she is able to use music at night time for distraction), and also a visit for pain in her left arm (problems with nerve compression in her neck in the past, she was concerned about stroke however they did not believe that this was going on, she is still having intermittent symptoms with numbness and pain down her left arm and neck, she will be seeing her neurosurgeon soon to address this). She denies new problems. She wants to know if it is safe for her to take an 81 mg aspirin daily to prevent stroke. She denies headaches or chest pains. She denies SOB or abdominal pain. She is taking her medications as prescribed and seeing nephrology regularly for kidney checks. Doing procrit injections at home now which are covered by the drug company.   PMH, [facility], social history reviewed and updated.   Review of Systems  Constitutional: Positive for activity change. Negative for appetite change, chills, fatigue, fever and unexpected weight change.  HENT: Positive for tinnitus. Negative for congestion, ear discharge, ear pain, trouble swallowing and voice change.   Eyes: Negative.   Respiratory: Negative.   Cardiovascular: Negative.   Gastrointestinal: Negative.   Musculoskeletal: Positive for arthralgias.  Skin: Negative.   Neurological: Positive for numbness. Negative for dizziness, facial asymmetry, weakness and headaches.  Psychiatric/Behavioral: Negative.       Objective:   Physical Exam  Constitutional: She is oriented to person, place, and time. She appears well-developed and well-nourished.  HENT:  Head: Normocephalic and atraumatic.  Right Ear:  External ear normal.  Left Ear: External ear normal.  Mouth/Throat: Oropharynx is clear and moist.  Eyes: EOM are normal.  Neck: Normal range of motion.  Cardiovascular: Normal rate and regular rhythm.   Pulmonary/Chest: Effort normal and breath sounds normal.  Abdominal: Soft.  Musculoskeletal: She exhibits no edema.  Neurological: She is alert and oriented to person, place, and time.  Skin: Skin is warm and dry.  Psychiatric: She has a normal mood and affect.   Vitals:   08/15/16 0931  BP: (!) 144/80  Pulse: 88  Resp: 12  Temp: 97.5 F (36.4 C)  TempSrc: Oral  SpO2: 98%  Weight: 238 lb (108 kg)  Height: 5\' 6"  (1.676 m)      Assessment & Plan:

## 2016-08-15 NOTE — Patient Instructions (Addendum)
It is okay to go back to the aspirin 81 mg daily.    DASH Eating Plan DASH stands for "Dietary Approaches to Stop Hypertension." The DASH eating plan is a healthy eating plan that has been shown to reduce high blood pressure (hypertension). It may also reduce your risk for type 2 diabetes, heart disease, and stroke. The DASH eating plan may also help with weight loss. What are tips for following this plan? General guidelines   Avoid eating more than 2,300 mg (milligrams) of salt (sodium) a day. If you have hypertension, you may need to reduce your sodium intake to 1,500 mg a day.  Limit alcohol intake to no more than 1 drink a day for nonpregnant women and 2 drinks a day for men. One drink equals 12 oz of beer, 5 oz of wine, or 1 oz of hard liquor.  Work with your health care provider to maintain a healthy body weight or to lose weight. Ask what an ideal weight is for you.  Get at least 30 minutes of exercise that causes your heart to beat faster (aerobic exercise) most days of the week. Activities may include walking, swimming, or biking.  Work with your health care provider or diet and nutrition specialist (dietitian) to adjust your eating plan to your individual calorie needs. Reading food labels   Check food labels for the amount of sodium per serving. Choose foods with less than 5 percent of the Daily Value of sodium. Generally, foods with less than 300 mg of sodium per serving fit into this eating plan.  To find whole grains, look for the word "whole" as the first word in the ingredient list. Shopping   Buy products labeled as "low-sodium" or "no salt added."  Buy fresh foods. Avoid canned foods and premade or frozen meals. Cooking   Avoid adding salt when cooking. Use salt-free seasonings or herbs instead of table salt or sea salt. Check with your health care provider or pharmacist before using salt substitutes.  Do not fry foods. Cook foods using healthy methods such as  baking, boiling, grilling, and broiling instead.  Cook with heart-healthy oils, such as olive, canola, soybean, or sunflower oil. Meal planning    Eat a balanced diet that includes:  5 or more servings of fruits and vegetables each day. At each meal, try to fill half of your plate with fruits and vegetables.  Up to 6-8 servings of whole grains each day.  Less than 6 oz of lean meat, poultry, or fish each day. A 3-oz serving of meat is about the same size as a deck of cards. One egg equals 1 oz.  2 servings of low-fat dairy each day.  A serving of nuts, seeds, or beans 5 times each week.  Heart-healthy fats. Healthy fats called Omega-3 fatty acids are found in foods such as flaxseeds and coldwater fish, like sardines, salmon, and mackerel.  Limit how much you eat of the following:  Canned or prepackaged foods.  Food that is high in trans fat, such as fried foods.  Food that is high in saturated fat, such as fatty meat.  Sweets, desserts, sugary drinks, and other foods with added sugar.  Full-fat dairy products.  Do not salt foods before eating.  Try to eat at least 2 vegetarian meals each week.  Eat more home-cooked food and less restaurant, buffet, and fast food.  When eating at a restaurant, ask that your food be prepared with less salt or no salt, if  possible. What foods are recommended? The items listed may not be a complete list. Talk with your dietitian about what dietary choices are best for you. Grains  Whole-grain or whole-wheat bread. Whole-grain or whole-wheat pasta. Brown rice. [place] Morrow. Bulgur. Whole-grain and low-sodium cereals. Pita bread. Low-fat, low-sodium crackers. Whole-wheat flour tortillas. Vegetables  Fresh or frozen vegetables (raw, steamed, roasted, or grilled). Low-sodium or reduced-sodium tomato and vegetable juice. Low-sodium or reduced-sodium tomato sauce and tomato paste. Low-sodium or reduced-sodium canned vegetables. Fruits  All  fresh, dried, or frozen fruit. Canned fruit in natural juice (without added sugar). Meat and other protein foods  Skinless chicken or [place]. Ground chicken or [place]. Pork with fat trimmed off. Fish and seafood. Egg whites. Dried beans, peas, or lentils. Unsalted nuts, nut butters, and seeds. Unsalted canned beans. Lean cuts of beef with fat trimmed off. Low-sodium, lean deli meat. Dairy  Low-fat (1%) or fat-free (skim) milk. Fat-free, low-fat, or reduced-fat cheeses. Nonfat, low-sodium ricotta or cottage cheese. Low-fat or nonfat yogurt. Low-fat, low-sodium cheese. Fats and oils  Soft margarine without trans fats. Vegetable oil. Low-fat, reduced-fat, or light mayonnaise and salad dressings (reduced-sodium). Canola, safflower, olive, soybean, and sunflower oils. Avocado. Seasoning and other foods  Herbs. Spices. Seasoning mixes without salt. Unsalted popcorn and pretzels. Fat-free sweets. What foods are not recommended? The items listed may not be a complete list. Talk with your dietitian about what dietary choices are best for you. Grains  Baked goods made with fat, such as croissants, muffins, or some breads. Dry pasta or rice meal packs. Vegetables  Creamed or fried vegetables. Vegetables in a cheese sauce. Regular canned vegetables (not low-sodium or reduced-sodium). Regular canned tomato sauce and paste (not low-sodium or reduced-sodium). Regular tomato and vegetable juice (not low-sodium or reduced-sodium). Angie Fava. Olives. Fruits  Canned fruit in a light or heavy syrup. Fried fruit. Fruit in cream or butter sauce. Meat and other protein foods  Fatty cuts of meat. Ribs. Fried meat. Berniece Salines. Sausage. Bologna and other processed lunch meats. Salami. Fatback. Hotdogs. Bratwurst. Salted nuts and seeds. Canned beans with added salt. Canned or smoked fish. Whole eggs or egg yolks. Chicken or [place] with skin. Dairy  Whole or 2% milk, cream, and half-and-half. Whole or full-fat cream cheese.  Whole-fat or sweetened yogurt. Full-fat cheese. Nondairy creamers. Whipped toppings. Processed cheese and cheese spreads. Fats and oils  Butter. Stick margarine. Lard. Shortening. Ghee. Bacon fat. Tropical oils, such as coconut, palm kernel, or palm oil. Seasoning and other foods  Salted popcorn and pretzels. Onion salt, garlic salt, seasoned salt, table salt, and sea salt. Worcestershire sauce. Tartar sauce. Barbecue sauce. Teriyaki sauce. Soy sauce, including reduced-sodium. Steak sauce. Canned and packaged gravies. Fish sauce. Oyster sauce. Cocktail sauce. Horseradish that you find on the shelf. Ketchup. Mustard. Meat flavorings and tenderizers. Bouillon cubes. Hot sauce and Tabasco sauce. Premade or packaged marinades. Premade or packaged taco seasonings. Relishes. Regular salad dressings. Where to find more information:  National Heart, Lung, and [place]: https://wilson-eaton.com/  American Heart Association: www.heart.org Summary  The DASH eating plan is a healthy eating plan that has been shown to reduce high blood pressure (hypertension). It may also reduce your risk for type 2 diabetes, heart disease, and stroke.  With the DASH eating plan, you should limit salt (sodium) intake to 2,300 mg a day. If you have hypertension, you may need to reduce your sodium intake to 1,500 mg a day.  When on the DASH eating plan, aim to eat more  fresh fruits and vegetables, whole grains, lean proteins, low-fat dairy, and heart-healthy fats.  Work with your health care provider or diet and nutrition specialist (dietitian) to adjust your eating plan to your individual calorie needs. This information is not intended to replace advice given to you by your health care provider. Make sure you discuss any questions you have with your health care provider. Document Released: 03/14/2011 Document Revised: 03/18/2016 Document Reviewed: 03/18/2016 Elsevier Interactive Patient Education  2017 [employer].

## 2016-08-16 ENCOUNTER — Encounter: Payer: Self-pay | Admitting: Internal Medicine

## 2016-08-16 DIAGNOSIS — H9312 Tinnitus, left ear: Secondary | ICD-10-CM | POA: Insufficient documentation

## 2016-08-16 NOTE — Assessment & Plan Note (Signed)
Doing her procrit injections at home with monitoring through nephrology which is much more cost effective for her.

## 2016-08-16 NOTE — Assessment & Plan Note (Signed)
No reason for the acute onset. We talked about how often tinnitus is a sign of hearing loss which she does have. She will continue to use distraction to sleep well and let us know if she has worsening of her symptoms.

## 2016-08-16 NOTE — Assessment & Plan Note (Signed)
Does sound to be and some significant arthritis on prior x-ray. She had imaging done about 1 year ago with CT lumbar and cervical with changes and some nerve compression possible in the cervical which is likely to be causing her symptoms. She will see neurosurgery and offered PT to her. She will wait on that evaluation. Okay to resume ASA 81 mg daily.

## 2016-08-19 ENCOUNTER — Telehealth: Payer: Self-pay | Admitting: Internal Medicine

## 2016-08-19 ENCOUNTER — Telehealth: Payer: Self-pay | Admitting: Cardiology

## 2016-08-19 NOTE — Telephone Encounter (Signed)
There is not a substantial heart problem. I will see a reason that I should see the patient.

## 2016-08-19 NOTE — Telephone Encounter (Signed)
I see no reason to see the patient

## 2016-08-19 NOTE — Telephone Encounter (Signed)
Pt called in and would like to know if dr can put referral for Neurology

## 2016-08-19 NOTE — Telephone Encounter (Signed)
New message    Pt cousin is calling for pt. He says he spoke with Dr. Tamala Julian last week and asked if he would see pt. Pt would like to switch providers from Dr. Percival Spanish to Dr. Tamala Julian.

## 2016-08-19 NOTE — Telephone Encounter (Signed)
Did they say why they wanted this or do I need to call

## 2016-08-19 NOTE — Telephone Encounter (Signed)
For what reason?   

## 2016-08-26 NOTE — Progress Notes (Signed)
Cardiology Office Note   Date:  08/27/2016   ID:  [name], DOB 1945/07/15, MRN [id number]  PCP:  Hoyt Koch, MD  Cardiologist:   Minus Breeding, MD   Chief Complaint  Patient presents with  . Heart Murmur      History of Present Illness: [name] is a 71 y.o. female who presents for evaluation of an abnormal EKG. She did have an echocardiogram in 2014 which was essentially normal.  She has HTN.  I saw her before a laminectomy.  She had a LBBB.  she had a negative Lexiscan Myoview.    She returns for follow-up. She was recently told she had a heart murmur. She actually had this last saw her last year and we discussed it but she doesn't recall this. She had some mild AI and MR on echo in 2013. She has lots of complaints of pain and tingling on the left side of her head down into her left arm with a burning sensation. It hurts somewhat to move. She was going to get lumbar back surgery when I saw her but she never had this. She is now scheduled to see a neurologist. She was in the emergency room at the end of April and I reviewed these records for this appointment and they thought this was a neuropathic or musculoskeletal pain. She had tinnitus and was in the emergency room again in early May and then followed up with ENT. They're the ones who set her up to see a neurologist. She's not describing substernal chest pressure. Breathing which was a problem is not as bad as it was. She's not having any presyncope or syncope. She's not having any palpitations.  Past Medical History:  Diagnosis Date  . Adenomatous colon polyp 01/2002  . Anemia   . CKD (chronic kidney disease), stage III   . Diverticulosis of colon   . DJD (degenerative joint disease)   . Hemorrhoids   . Hemorrhoids   . Hypertension   . Lumbar back pain   . Neuropathy   . Obesity   . PONV (postoperative nausea and vomiting)   . Renal cyst   . Shortness of breath dyspnea    with exertion  . Urinary  tract infection   . Venous insufficiency   . Vitamin D deficiency     Past Surgical History:  Procedure Laterality Date  . ABDOMINAL HYSTERECTOMY    . COLONOSCOPY       Current Outpatient Prescriptions  Medication Sig Dispense Refill  . acetaminophen-codeine (TYLENOL #3) 300-30 MG tablet Take 1 tablet by mouth 2 (two) times daily.    Marland Kitchen amLODipine (NORVASC) 10 MG tablet Take 1 tablet (10 mg total) by mouth daily. 30 tablet 0  . calcitRIOL (ROCALTROL) 0.25 MCG capsule Take 0.5 mcg by mouth daily.  0  . gabapentin (NEURONTIN) 300 MG capsule Take 1 capsule (300 mg total) by mouth 3 (three) times daily as needed (pain). 90 capsule 3  . hydrALAZINE (APRESOLINE) 50 MG tablet TAKE 1 TABLET BY MOUTH THREE TIMES DAILY 270 tablet 0  . hydrocortisone (ANUSOL-HC) 2.5 % rectal cream Place 1 application rectally as needed. (Patient taking differently: Place 1 application rectally as needed for hemorrhoids. ) 30 g 0  . labetalol (NORMODYNE) 200 MG tablet TAKE 2 TABLETS BY MOUTH TWICE DAILY 120 tablet 0  . losartan (COZAAR) 100 MG tablet TAKE 1 TABLET BY MOUTH EVERY DAY 30 tablet 11  . ondansetron (ZOFRAN) 4 MG tablet Take  1 tablet (4 mg total) by mouth every 8 (eight) hours as needed for nausea or vomiting. 20 tablet 0  . PROCRIT 93818 UNIT/ML injection Inject 40,000 Units into the skin every 14 (fourteen) days.  3  . Propylene Glycol (SYSTANE BALANCE) 0.6 % SOLN Place 1 drop into both eyes daily as needed (for dry eyes).    Marland Kitchen spironolactone (ALDACTONE) 25 MG tablet Take 1 tablet (25 mg total) by mouth daily. 30 tablet 6  . torsemide (DEMADEX) 20 MG tablet TAKE 2 TABLETS BY MOUTH EVERY MORNING 60 tablet 6  . traMADol (ULTRAM) 50 MG tablet Take 1 tablet (50 mg total) by mouth 3 (three) times daily as needed. 90 tablet 0  . triamcinolone ointment (KENALOG) 0.5 % Apply 1 application topically 2 (two) times daily. 100 g 3   No current facility-administered medications for this visit.     Allergies:    Minoxidil and Naproxen    ROS:  Please see the history of present illness.   Otherwise, review of systems are positive for back pain limiting her activity.   All other systems are reviewed and negative.    PHYSICAL EXAM: VS:  BP (!) 154/78   Pulse 89   Ht 5\' 6"  (1.676 m)   Wt 236 lb (107 kg)   BMI 38.09 kg/m  , BMI Body mass index is 38.09 kg/m.  GENERAL:  Well appearing HEENT:  Pupils equal round and reactive, fundi not visualized, oral mucosa unremarkable, poor dentition. NECK:  No jugular venous distention, waveform within normal limits, carotid upstroke brisk and symmetric, no bruits, no thyromegaly LYMPHATICS:  No cervical, inguinal adenopathy LUNGS:  Clear to auscultation bilaterally BACK:  No CVA tenderness CHEST:  Unremarkable HEART:  PMI not displaced or sustained,S1 and S2 within normal limits, no S3, no S4, no clicks, no rubs, 2 out of 6 apical early peaking systolic murmur radiating slightly out the aortic outflow tract, no diastolic  murmurs ABD:  Flat, positive bowel sounds normal in frequency in pitch, no bruits, no rebound, no guarding, no midline pulsatile mass, no hepatomegaly, no splenomegaly EXT:  2 plus pulses throughout, moderate leg edema bilateral, no cyanosis no clubbing SKIN:  No rashes no nodules NEURO:  Cranial nerves II through XII grossly intact, motor grossly intact throughout PSYCH:  Cognitively intact, oriented to person place and time   EKG:  EKG is  ordered today. The ekg ordered today demonstrates sinus rhythm, rate 89, left bundle branch block, left axis deviation. No old EKGs for comparison.   Recent Labs: 08/04/2016: Platelets 224 08/14/2016: BUN 47; Creatinine, Ser 2.70; Hemoglobin 11.9; Potassium 4.5; Sodium 137    Lipid Panel    Component Value Date/Time   CHOL 146 01/10/2012 1404   TRIG 93.0 01/10/2012 1404   HDL 50.20 01/10/2012 1404   CHOLHDL 3 01/10/2012 1404   VLDL 18.6 01/10/2012 1404   LDLCALC 77 01/10/2012 1404      Wt  Readings from Last 3 Encounters:  08/27/16 236 lb (107 kg)  08/15/16 238 lb (108 kg)  08/04/16 230 lb (104.3 kg)      Other studies Reviewed: Additional studies/ records that were reviewed today include:  ED records Review of the above records demonstrates:     ASSESSMENT AND PLAN:  MURMUR:   I suspect some aortic sclerosis. There was no change with Valsalva. She will get an echocardiogram.   CAROTID STENOSIS:    There was right 40% stenosis last year and we can wait one  more year for this to be repeated.   ABNORMAL EKG:   I suspect this is related to hypertension.  I will follow up with an echo.   HTN:   Her blood pressures elevated today but she says this is unusual.  She checks it at home.  She will continue current murmurs.  CKD:  This is followed by renal.    Current medicines are reviewed at length with the patient today.  The patient does not have concerns regarding medicines.  The following changes have been made:   None  Labs/ tests ordered today include:    Orders Placed This Encounter  Procedures  . EKG 12-Lead  . ECHOCARDIOGRAM COMPLETE     Disposition:   FU with me as needed. Ronnell Guadalajara, MD  08/27/2016 3:05 PM    [facility]

## 2016-08-27 ENCOUNTER — Encounter: Payer: Self-pay | Admitting: Cardiology

## 2016-08-27 ENCOUNTER — Ambulatory Visit (INDEPENDENT_AMBULATORY_CARE_PROVIDER_SITE_OTHER): Payer: Medicare Other | Admitting: Cardiology

## 2016-08-27 VITALS — BP 154/78 | HR 89 | Ht 66.0 in | Wt 236.0 lb

## 2016-08-27 DIAGNOSIS — R011 Cardiac murmur, unspecified: Secondary | ICD-10-CM

## 2016-08-27 DIAGNOSIS — I10 Essential (primary) hypertension: Secondary | ICD-10-CM

## 2016-08-27 DIAGNOSIS — I6521 Occlusion and stenosis of right carotid artery: Secondary | ICD-10-CM | POA: Diagnosis not present

## 2016-08-27 DIAGNOSIS — I447 Left bundle-branch block, unspecified: Secondary | ICD-10-CM | POA: Diagnosis not present

## 2016-08-27 DIAGNOSIS — D631 Anemia in chronic kidney disease: Secondary | ICD-10-CM | POA: Diagnosis not present

## 2016-08-27 DIAGNOSIS — N184 Chronic kidney disease, stage 4 (severe): Secondary | ICD-10-CM | POA: Diagnosis not present

## 2016-08-27 NOTE — Patient Instructions (Signed)
Medication Instructions:  Continue current medications  Labwork: None Ordered  Testing/Procedures: Your physician has requested that you have an echocardiogram. Echocardiography is a painless test that uses sound waves to create images of your heart. It provides your doctor with information about the size and shape of your heart and how well your heart's chambers and valves are working. This procedure takes approximately one hour. There are no restrictions for this procedure.  Follow-Up: Your physician recommends that you schedule a follow-up appointment in: As Needed   Any Other Special Instructions Will Be Listed Below (If Applicable).   If you need a refill on your cardiac medications before your next appointment, please call your pharmacy.

## 2016-09-03 ENCOUNTER — Other Ambulatory Visit (HOSPITAL_COMMUNITY): Payer: Self-pay | Admitting: *Deleted

## 2016-09-04 ENCOUNTER — Ambulatory Visit (HOSPITAL_COMMUNITY): Admission: RE | Admit: 2016-09-04 | Payer: Medicare Other | Source: Ambulatory Visit

## 2016-09-11 ENCOUNTER — Other Ambulatory Visit (HOSPITAL_COMMUNITY): Payer: Medicare Other

## 2016-09-16 ENCOUNTER — Other Ambulatory Visit (HOSPITAL_COMMUNITY): Payer: Self-pay | Admitting: *Deleted

## 2016-09-17 ENCOUNTER — Ambulatory Visit (HOSPITAL_COMMUNITY)
Admission: RE | Admit: 2016-09-17 | Discharge: 2016-09-17 | Disposition: A | Discharge status: Home / Self Care | Payer: Medicare Other | Source: Ambulatory Visit | Attending: Nephrology | Admitting: Nephrology

## 2016-09-17 DIAGNOSIS — D509 Iron deficiency anemia, unspecified: Secondary | ICD-10-CM | POA: Diagnosis not present

## 2016-09-17 MED ORDER — SODIUM CHLORIDE 0.9 % IV SOLN
510.0000 mg | Freq: Once | INTRAVENOUS | Status: AC
  Administered 2016-09-17: 510 mg via INTRAVENOUS
  Filled 2016-09-17: qty 17

## 2016-09-24 ENCOUNTER — Ambulatory Visit (HOSPITAL_COMMUNITY): Payer: Medicare Other | Attending: Cardiovascular Disease

## 2016-09-24 ENCOUNTER — Other Ambulatory Visit: Payer: Self-pay

## 2016-09-24 DIAGNOSIS — I351 Nonrheumatic aortic (valve) insufficiency: Secondary | ICD-10-CM | POA: Diagnosis not present

## 2016-09-24 DIAGNOSIS — R011 Cardiac murmur, unspecified: Secondary | ICD-10-CM | POA: Insufficient documentation

## 2016-09-24 DIAGNOSIS — I313 Pericardial effusion (noninflammatory): Secondary | ICD-10-CM | POA: Diagnosis not present

## 2016-09-26 ENCOUNTER — Other Ambulatory Visit (HOSPITAL_COMMUNITY): Payer: Medicare Other

## 2016-09-26 DIAGNOSIS — D631 Anemia in chronic kidney disease: Secondary | ICD-10-CM | POA: Diagnosis not present

## 2016-09-26 DIAGNOSIS — I129 Hypertensive chronic kidney disease with stage 1 through stage 4 chronic kidney disease, or unspecified chronic kidney disease: Secondary | ICD-10-CM | POA: Diagnosis not present

## 2016-10-01 ENCOUNTER — Telehealth: Payer: Self-pay | Admitting: Cardiology

## 2016-10-01 DIAGNOSIS — I351 Nonrheumatic aortic (valve) insufficiency: Secondary | ICD-10-CM

## 2016-10-01 NOTE — Telephone Encounter (Signed)
Leave message for pt to call back 

## 2016-10-01 NOTE — Telephone Encounter (Signed)
Patient returning call, if you are unable to reach patient she requests you leave a detailed message.Thanks.

## 2016-10-01 NOTE — Telephone Encounter (Signed)
New message     Pt is returning [place] call for results

## 2016-10-01 NOTE — Telephone Encounter (Signed)
Leave detailed mesage with result on pt voicemail (DPR).Marland KitchenEcho ordered and send to scheduler to be schedule

## 2016-10-01 NOTE — Telephone Encounter (Signed)
-----   Message from Minus Breeding, MD sent at 09/28/2016  8:37 PM EDT ----- There was moderate aortic regurgitation.  I would like to see her back in one year.  She will need a repeat echo prior to that appt in one year .  Call Ms. Kludt with the results and send results to Hoyt Koch, MD

## 2016-10-03 ENCOUNTER — Encounter: Payer: Self-pay | Admitting: Neurology

## 2016-10-03 ENCOUNTER — Ambulatory Visit (INDEPENDENT_AMBULATORY_CARE_PROVIDER_SITE_OTHER): Payer: Medicare Other | Admitting: Neurology

## 2016-10-03 VITALS — BP 181/91 | HR 83 | Ht 66.0 in | Wt 244.6 lb

## 2016-10-03 DIAGNOSIS — R209 Unspecified disturbances of skin sensation: Secondary | ICD-10-CM | POA: Diagnosis not present

## 2016-10-03 DIAGNOSIS — R201 Hypoesthesia of skin: Secondary | ICD-10-CM | POA: Diagnosis not present

## 2016-10-03 DIAGNOSIS — R4189 Other symptoms and signs involving cognitive functions and awareness: Secondary | ICD-10-CM | POA: Diagnosis not present

## 2016-10-03 DIAGNOSIS — R202 Paresthesia of skin: Secondary | ICD-10-CM

## 2016-10-03 DIAGNOSIS — IMO0001 Reserved for inherently not codable concepts without codable children: Secondary | ICD-10-CM

## 2016-10-03 DIAGNOSIS — R449 Unspecified symptoms and signs involving general sensations and perceptions: Secondary | ICD-10-CM

## 2016-10-03 NOTE — Patient Instructions (Signed)
Remember to drink plenty of fluid, eat healthy meals and do not skip any meals. Try to eat protein with a every meal and eat a healthy snack such as fruit or nuts in between meals. Try to keep a regular sleep-wake schedule and try to exercise daily, particularly in the form of walking, 20-30 minutes a day, if you can.   As far as your medications are concerned, I would like to suggest: At onset of left facial or body sensory pain take gabapentin as prescribed  As far as diagnostic testing: MRI brain  I would like to see you back as needed, sooner if we need to. Please call us with any interim questions, concerns, problems, updates or refill requests.   Our phone number is [phone]. We also have an after hours call service for urgent matters and there is a physician on-call for urgent questions. For any emergencies you know to call 911 or go to the nearest emergency room

## 2016-10-03 NOTE — Progress Notes (Signed)
[facility]    Provider:  Dr Jaynee Eagles Referring Provider: Hoyt Koch, * Primary Care Physician:  Hoyt Koch, MD  CC:  Facial pain  HPI:  [name] is a 71 y.o. female here as a referral from Dr. Sharlet Salina for facial pain. Patient has a past medical history of chronic kidney disease, hypertension, neuropathy, obesity, urinary tract infection. The pain starts on the left forehead/temple to the ear and feels like a hot burning sensation. Episodic. She has high blood pressure and she has been spacing out her medication which helps. She has chronic neck pain "a crook" started on the left side and radiated to the top of the head. She saw cardiology and carotids were a little blocked but ok. She says she has a pinched nerve in the neck and follows with orthopaedics. 2 years she had the sensation in the setting of very high blood pressure and being treated in the hospital. She would then have it every now and then since then. In May started getting worse and went to the hospital. She feels better with changes in medicine. Now the pain is not often last time she had it was Sunday, a mild burning sensation on the left temple area and radiates to the ear and left arm. In April it lasted all day and she had to go to the emergency room. No dizziness, no weakness, no vision changes, no changes in speech or mentation or any other focal neurologic disorder. No other focal neurologic deficits, associated symptoms, inciting events or modifiable factors.  Reviewed notes, labs and imaging from outside physicians, which showed:  Reviewed labs CBC 08/04/2016 showed mild anemia hemoglobin 11.7, BMP showed slightly elevated glucose 133, BUN 38, creatinine 2.33 and GFR of 20 otherwise normal.  The primary care notes. She is a 71 year old female who had sudden onset unilateral tinnitus. Patient was seen 08/14/2016 and 2 days prior she noticed ringing in her left ear. It fluctuates  slightly in intensity but is not pulsatile. No issues with her right ear. She does think her hearing is probably gradually worsened over time but nothing acute. No vertigo or dizziness. She did have a recent right maxillary tooth extraction which is still painful. She also describes some left temporal and mid facial burning and tingling. She has some neck and back pain and some radiating neurologic symptoms down her left arm. She has seen Dr. Peggye Form who did not think this is a surgical indication. Exam showed normal mental status, hearing well, voice is clear, cranial nerves intact, ear canals clear normal tympanic membranes, she has a slight bit of accumulated skin in the medial canal on the right side, anterior notices moistened patent, neck unremarkable. Pure tone audiometry shows basically symmetric hearing starting her on 5020 dB in both ears then dropping off gradually and higher frequencies. Discrimination is 96% each side. Tympanograms normally side.  Review of Systems: Patient complains of symptoms per HPI as well as the following symptoms: numbness, tingling, SOB with exertion, weight gain. Pertinent negatives and positives per HPI. All others negative.   Social History   Social History  . Marital status: Married    Spouse name: Juanda Crumble  . Number of children: 1  . Years of education: 3   Occupational History  . retired from [employer], [employer]    Social History Main Topics  . Smoking status: Former Smoker    Packs/day: 0.40    Years: 15.00    Types: Cigarettes  Quit date: 04/08/1982  . Smokeless tobacco: Never Used  . Alcohol use No  . Drug use: No  . Sexual activity: Not on file   Other Topics Concern  . Not on file   Social History Narrative   Lives at home w/ her husband   Right-handed   Daily caffeine     Family History  Problem Relation Age of Onset  . Diabetes Mother   . Colon polyps Mother   . Hypertension Mother   . Hypertension Brother   . Hypertension  Sister   . Colon cancer Neg Hx     Past Medical History:  Diagnosis Date  . Adenomatous colon polyp 01/2002  . Anemia   . CKD (chronic kidney disease), stage III   . Diverticulosis of colon   . DJD (degenerative joint disease)   . Hemorrhoids   . Hypertension   . Lumbar back pain   . Neuropathy   . Obesity   . PONV (postoperative nausea and vomiting)   . Renal cyst   . Shortness of breath dyspnea    with exertion  . Urinary tract infection   . Venous insufficiency   . Vitamin D deficiency     Past Surgical History:  Procedure Laterality Date  . ABDOMINAL HYSTERECTOMY    . COLONOSCOPY      Current Outpatient Prescriptions  Medication Sig Dispense Refill  . acetaminophen-codeine (TYLENOL #3) 300-30 MG tablet Take 1 tablet by mouth 2 (two) times daily.    Marland Kitchen amLODipine (NORVASC) 10 MG tablet Take 1 tablet (10 mg total) by mouth daily. 30 tablet 0  . calcitRIOL (ROCALTROL) 0.25 MCG capsule Take 0.5 mcg by mouth daily.  0  . gabapentin (NEURONTIN) 300 MG capsule Take 1 capsule (300 mg total) by mouth 3 (three) times daily as needed (pain). 90 capsule 3  . hydrALAZINE (APRESOLINE) 50 MG tablet TAKE 1 TABLET BY MOUTH THREE TIMES DAILY 270 tablet 0  . hydrocortisone (ANUSOL-HC) 2.5 % rectal cream Place 1 application rectally as needed. (Patient taking differently: Place 1 application rectally as needed for hemorrhoids. ) 30 g 0  . labetalol (NORMODYNE) 200 MG tablet TAKE 2 TABLETS BY MOUTH TWICE DAILY 120 tablet 0  . losartan (COZAAR) 100 MG tablet TAKE 1 TABLET BY MOUTH EVERY DAY 30 tablet 11  . ondansetron (ZOFRAN) 4 MG tablet Take 1 tablet (4 mg total) by mouth every 8 (eight) hours as needed for nausea or vomiting. 20 tablet 0  . PROCRIT 09381 UNIT/ML injection Inject 40,000 Units into the skin every 14 (fourteen) days.  3  . Propylene Glycol (SYSTANE BALANCE) 0.6 % SOLN Place 1 drop into both eyes daily as needed (for dry eyes).    Marland Kitchen spironolactone (ALDACTONE) 25 MG tablet  Take 1 tablet (25 mg total) by mouth daily. 30 tablet 6  . torsemide (DEMADEX) 20 MG tablet TAKE 2 TABLETS BY MOUTH EVERY MORNING 60 tablet 6  . traMADol (ULTRAM) 50 MG tablet Take 1 tablet (50 mg total) by mouth 3 (three) times daily as needed. 90 tablet 0  . triamcinolone ointment (KENALOG) 0.5 % Apply 1 application topically 2 (two) times daily. 100 g 3   No current facility-administered medications for this visit.     Allergies as of 10/03/2016 - Review Complete 10/03/2016  Allergen Reaction Noted  . Minoxidil Palpitations and Other (See Comments) 01/20/2012  . Naproxen Swelling 12/23/2006    Vitals: BP (!) 181/91   Pulse 83   Ht 5\' 6"  (  1.676 m)   Wt 244 lb 9.6 oz (110.9 kg)   BMI 39.48 kg/m  Last Weight:  Wt Readings from Last 1 Encounters:  10/03/16 244 lb 9.6 oz (110.9 kg)   Last Height:   Ht Readings from Last 1 Encounters:  10/03/16 5\' 6"  (1.676 m)   Physical exam: Exam: Gen: NAD, very conversant, tangential             CV: RRR, +SEM. No Carotid Bruits. No peripheral edema, warm, nontender Eyes: Conjunctivae clear without exudates or hemorrhage  Neuro: Detailed Neurologic Exam  Speech:    Speech is normal; fluent and spontaneous with normal comprehension.  Cognition:    The patient is oriented to person, place, and time;     recent and remote memory intact;     language fluent;     normal attention, concentration,     fund of knowledge Cranial Nerves:    The pupils are equal, round, and reactive to light. Attempted fundoscopic exam could not visualize. . Visual fields are full to finger confrontation. Extraocular movements are intact. Trigeminal sensation is intact and the muscles of mastication are normal. The face is symmetric. The palate elevates in the midline. Hearing intact. Voice is normal. Shoulder shrug is normal. The tongue has normal motion without fasciculations.   Coordination:    No dysmetria  Gait:    Wide based and small steps  Motor  Observation:    No asymmetry, no atrophy, and no involuntary movements noted. Tone:    Normal muscle tone.    Posture:    Cervical kyphosis and stooped posture    Strength:    Strength is V/V in the upper and lower limbs.      Sensation: intact to LT     Reflex Exam:  DTR's: Absent AJs.    Toes:    The toes are equivocal bilaterally.   Clonus:    Clonus is absent.       Assessment/Plan:  Patient with complicated history with left-sided facial pain as well as left arm and left leg sensory changes. Will order MRI brain to evaluate for stroke or other intracranial etiologies. May be trigeminal irritation in the face, when she has the pain I advised her to take neurontin which she already takes at night for neuropathy.     Sarina Ill, MD  [facility] [address] [place], [place] [address]  Phone [phone] Fax [phone]

## 2016-10-21 ENCOUNTER — Ambulatory Visit
Admission: RE | Admit: 2016-10-21 | Discharge: 2016-10-21 | Disposition: A | Discharge status: Home / Self Care | Payer: Medicare Other | Source: Ambulatory Visit | Attending: Neurology | Admitting: Neurology

## 2016-10-21 ENCOUNTER — Telehealth: Payer: Self-pay

## 2016-10-21 DIAGNOSIS — R202 Paresthesia of skin: Secondary | ICD-10-CM

## 2016-10-21 DIAGNOSIS — R209 Unspecified disturbances of skin sensation: Secondary | ICD-10-CM | POA: Diagnosis not present

## 2016-10-21 DIAGNOSIS — R449 Unspecified symptoms and signs involving general sensations and perceptions: Secondary | ICD-10-CM

## 2016-10-21 DIAGNOSIS — IMO0001 Reserved for inherently not codable concepts without codable children: Secondary | ICD-10-CM

## 2016-10-21 DIAGNOSIS — R4189 Other symptoms and signs involving cognitive functions and awareness: Secondary | ICD-10-CM

## 2016-10-21 DIAGNOSIS — R200 Anesthesia of skin: Secondary | ICD-10-CM | POA: Diagnosis not present

## 2016-10-21 NOTE — Telephone Encounter (Signed)
-----   Message from Melvenia Beam, MD sent at 10/21/2016  5:36 PM EDT ----- Brain is normal for age thanks

## 2016-10-21 NOTE — Telephone Encounter (Signed)
Called w/ MRI results. May call back w/ questions/concerns.

## 2016-10-23 DIAGNOSIS — D631 Anemia in chronic kidney disease: Secondary | ICD-10-CM | POA: Diagnosis not present

## 2016-11-08 DIAGNOSIS — I129 Hypertensive chronic kidney disease with stage 1 through stage 4 chronic kidney disease, or unspecified chronic kidney disease: Secondary | ICD-10-CM | POA: Diagnosis not present

## 2016-11-08 DIAGNOSIS — N2581 Secondary hyperparathyroidism of renal origin: Secondary | ICD-10-CM | POA: Diagnosis not present

## 2016-11-08 DIAGNOSIS — D631 Anemia in chronic kidney disease: Secondary | ICD-10-CM | POA: Diagnosis not present

## 2016-11-08 DIAGNOSIS — N184 Chronic kidney disease, stage 4 (severe): Secondary | ICD-10-CM | POA: Diagnosis not present

## 2016-11-08 DIAGNOSIS — E8770 Fluid overload, unspecified: Secondary | ICD-10-CM | POA: Diagnosis not present

## 2016-11-15 DIAGNOSIS — N390 Urinary tract infection, site not specified: Secondary | ICD-10-CM | POA: Diagnosis not present

## 2016-11-27 ENCOUNTER — Ambulatory Visit (INDEPENDENT_AMBULATORY_CARE_PROVIDER_SITE_OTHER)
Admission: RE | Admit: 2016-11-27 | Discharge: 2016-11-27 | Disposition: A | Discharge status: Home / Self Care | Payer: Medicare Other | Source: Ambulatory Visit | Attending: Nurse Practitioner | Admitting: Nurse Practitioner

## 2016-11-27 ENCOUNTER — Other Ambulatory Visit (INDEPENDENT_AMBULATORY_CARE_PROVIDER_SITE_OTHER): Payer: Medicare Other

## 2016-11-27 ENCOUNTER — Ambulatory Visit (INDEPENDENT_AMBULATORY_CARE_PROVIDER_SITE_OTHER): Payer: Medicare Other | Admitting: Nurse Practitioner

## 2016-11-27 ENCOUNTER — Telehealth: Payer: Self-pay | Admitting: Internal Medicine

## 2016-11-27 ENCOUNTER — Encounter: Payer: Self-pay | Admitting: Nurse Practitioner

## 2016-11-27 VITALS — BP 170/92 | HR 85 | Temp 97.8°F | Ht 66.0 in | Wt 238.0 lb

## 2016-11-27 DIAGNOSIS — R0602 Shortness of breath: Secondary | ICD-10-CM

## 2016-11-27 DIAGNOSIS — I5033 Acute on chronic diastolic (congestive) heart failure: Secondary | ICD-10-CM

## 2016-11-27 DIAGNOSIS — R05 Cough: Secondary | ICD-10-CM

## 2016-11-27 DIAGNOSIS — R0982 Postnasal drip: Secondary | ICD-10-CM | POA: Diagnosis not present

## 2016-11-27 DIAGNOSIS — D631 Anemia in chronic kidney disease: Secondary | ICD-10-CM | POA: Diagnosis not present

## 2016-11-27 DIAGNOSIS — R058 Other specified cough: Secondary | ICD-10-CM

## 2016-11-27 LAB — CBC WITH DIFFERENTIAL/PLATELET
Basophils Absolute: 0 10*3/uL (ref 0.0–0.1)
Basophils Relative: 0.3 % (ref 0.0–3.0)
EOS PCT: 0.8 % (ref 0.0–5.0)
Eosinophils Absolute: 0.1 10*3/uL (ref 0.0–0.7)
HCT: 32.9 % — ABNORMAL LOW (ref 36.0–46.0)
Hemoglobin: 10.7 g/dL — ABNORMAL LOW (ref 12.0–15.0)
LYMPHS ABS: 1.5 10*3/uL (ref 0.7–4.0)
Lymphocytes Relative: 19.7 % (ref 12.0–46.0)
MCHC: 32.5 g/dL (ref 30.0–36.0)
MCV: 91.9 fl (ref 78.0–100.0)
MONO ABS: 0.6 10*3/uL (ref 0.1–1.0)
MONOS PCT: 7.9 % (ref 3.0–12.0)
NEUTROS ABS: 5.3 10*3/uL (ref 1.4–7.7)
NEUTROS PCT: 71.3 % (ref 43.0–77.0)
Platelets: 217 10*3/uL (ref 150.0–400.0)
RBC: 3.58 Mil/uL — ABNORMAL LOW (ref 3.87–5.11)
RDW: 17.4 % — AB (ref 11.5–15.5)
WBC: 7.4 10*3/uL (ref 4.0–10.5)

## 2016-11-27 LAB — BASIC METABOLIC PANEL
BUN: 31 mg/dL — AB (ref 6–23)
CHLORIDE: 100 meq/L (ref 96–112)
CO2: 35 meq/L — AB (ref 19–32)
Calcium: 9.9 mg/dL (ref 8.4–10.5)
Creatinine, Ser: 1.97 mg/dL — ABNORMAL HIGH (ref 0.40–1.20)
GFR: 32.13 mL/min — ABNORMAL LOW (ref >60.00)
GLUCOSE: 92 mg/dL (ref 70–99)
POTASSIUM: 3.6 meq/L (ref 3.5–5.1)
Sodium: 142 mEq/L (ref 135–145)

## 2016-11-27 MED ORDER — BENZONATATE 100 MG PO CAPS: 100.0000 mg | ORAL_CAPSULE | Freq: Three times a day (TID) | ORAL | 0 refills | Status: DC | PRN

## 2016-11-27 MED ORDER — IPRATROPIUM BROMIDE 0.03 % NA SOLN: 2.0000 | Freq: Two times a day (BID) | NASAL | 0 refills | Status: DC

## 2016-11-27 MED ORDER — RANITIDINE HCL 300 MG PO TABS: 300.0000 mg | ORAL_TABLET | Freq: Every day | ORAL | 1 refills | Status: DC

## 2016-11-27 NOTE — Telephone Encounter (Signed)
Pt returned your call regarding her chest xray. I gave her Charlotte's response. She said that when you call her about her lab results, you can leave her a detailed message on her voicemail if she it not available.

## 2016-11-27 NOTE — Progress Notes (Signed)
Subjective:  Patient ID: [name], female    DOB: 1945/11/05  Age: 71 y.o. MRN: [id number]  CC: Cough (coughing for 2 mo--gagging when cough at night--SOB--had tooth pull in April effect sinus cavity/ med consult?)   Cough  This is a new problem. The current episode started more than 1 month ago. The problem has been gradually worsening. The problem occurs constantly. The cough is productive of sputum. Associated symptoms include nasal congestion, postnasal drip, rhinorrhea and shortness of breath. Pertinent negatives include no chest pain, chills, fever or wheezing. The symptoms are aggravated by lying down (and exertion). Risk factors for lung disease include smoking/tobacco exposure (smoked for 22yrs before quitting). She has tried OTC cough suppressant for the symptoms. The treatment provided no relief. There is no history of asthma or COPD.   Productive cough x 73months Clear sputum. No improvement with OTC cough medication, and flonase.  Outpatient Medications Prior to Visit  Medication Sig Dispense Refill  . acetaminophen-codeine (TYLENOL #3) 300-30 MG tablet Take 1 tablet by mouth 2 (two) times daily.    Marland Kitchen amLODipine (NORVASC) 10 MG tablet Take 1 tablet (10 mg total) by mouth daily. 30 tablet 0  . calcitRIOL (ROCALTROL) 0.25 MCG capsule Take 0.5 mcg by mouth daily.  0  . gabapentin (NEURONTIN) 300 MG capsule Take 1 capsule (300 mg total) by mouth 3 (three) times daily as needed (pain). 90 capsule 3  . hydrALAZINE (APRESOLINE) 50 MG tablet TAKE 1 TABLET BY MOUTH THREE TIMES DAILY 270 tablet 0  . hydrocortisone (ANUSOL-HC) 2.5 % rectal cream Place 1 application rectally as needed. (Patient taking differently: Place 1 application rectally as needed for hemorrhoids. ) 30 g 0  . labetalol (NORMODYNE) 200 MG tablet TAKE 2 TABLETS BY MOUTH TWICE DAILY 120 tablet 0  . losartan (COZAAR) 100 MG tablet TAKE 1 TABLET BY MOUTH EVERY DAY 30 tablet 11  . ondansetron (ZOFRAN) 4 MG tablet Take 1  tablet (4 mg total) by mouth every 8 (eight) hours as needed for nausea or vomiting. 20 tablet 0  . PROCRIT 81448 UNIT/ML injection Inject 40,000 Units into the skin every 14 (fourteen) days.  3  . Propylene Glycol (SYSTANE BALANCE) 0.6 % SOLN Place 1 drop into both eyes daily as needed (for dry eyes).    Marland Kitchen spironolactone (ALDACTONE) 25 MG tablet Take 1 tablet (25 mg total) by mouth daily. 30 tablet 6  . torsemide (DEMADEX) 20 MG tablet TAKE 2 TABLETS BY MOUTH EVERY MORNING 60 tablet 6  . traMADol (ULTRAM) 50 MG tablet Take 1 tablet (50 mg total) by mouth 3 (three) times daily as needed. 90 tablet 0  . triamcinolone ointment (KENALOG) 0.5 % Apply 1 application topically 2 (two) times daily. 100 g 3   No facility-administered medications prior to visit.     ROS See HPI  Objective:  BP (!) 170/92   Pulse 85   Temp 97.8 F (36.6 C)   Ht 5\' 6"  (1.676 m)   Wt 238 lb (108 kg)   SpO2 97%   BMI 38.41 kg/m   BP Readings from Last 3 Encounters:  11/27/16 (!) 170/92  10/03/16 (!) 181/91  09/17/16 (!) 158/77    Wt Readings from Last 3 Encounters:  11/27/16 238 lb (108 kg)  10/03/16 244 lb 9.6 oz (110.9 kg)  09/17/16 230 lb (104.3 kg)    Physical Exam  Constitutional: She is oriented to person, place, and time. No distress.  Cardiovascular: Normal rate and regular  rhythm.   Murmur heard. Pulmonary/Chest: Effort normal. No respiratory distress. She has no wheezes. She has no rales.  Musculoskeletal: She exhibits edema.  Neurological: She is alert and oriented to person, place, and time.  Skin: Skin is warm and dry.  Vitals reviewed.   Lab Results  Component Value Date   WBC 7.4 11/27/2016   HGB 10.7 (L) 11/27/2016   HCT 32.9 (L) 11/27/2016   PLT 217.0 11/27/2016   GLUCOSE 92 11/27/2016   CHOL 146 01/10/2012   TRIG 93.0 01/10/2012   HDL 50.20 01/10/2012   LDLCALC 77 01/10/2012   ALT 21 04/25/2014   AST 32 04/25/2014   NA 142 11/27/2016   K 3.6 11/27/2016   CL 100  11/27/2016   CREATININE 1.97 (H) 11/27/2016   BUN 31 (H) 11/27/2016   CO2 35 (H) 11/27/2016   TSH 1.17 10/18/2014   INR 1.14 04/27/2014    Mr Brain Wo Contrast  Result Date: 10/21/2016  [facility] [address], [place], [place] [address] [phone] NEUROIMAGING REPORT STUDY DATE: 10/21/2016 PATIENT NAME: [name] DOB: 01-31-1946 MRN: [id number] EXAM: MRI Brain without contrast ORDERING CLINICIAN: Sarina Ill M.D. CLINICAL HISTORY: 71 year old woman with left-sided numbness/dysesthesia COMPARISON FILMS: none TECHNIQUE: MRI of the brain without contrast was obtained utilizing 5 mm axial slices with T1, T2, T2 flair, SWI and diffusion weighted views.  T1 sagittal and T2 coronal views were obtained. CONTRAST: none IMAGING SITE: [facility], [place], [place] FINDINGS: On sagittal images, the spinal cord is imaged caudally to C3 and is normal in caliber.   The contents of the posterior fossa are of normal size and position.   The pituitary gland and optic chiasm appear normal.    Brain volume appears normal for age.   The ventricles are normal in size for ageand without distortion.  There are no abnormal extra-axial collections of fluid.  The cerebellum and brainstem appears normal.   The deep gray matter appears normal.  In the hemispheres, there are T2/FLAIR hyperintense foci in the subcortical deep and periventricular white matter. None of the foci appears to be acute. Diffusion weighted images are normal.  Susceptibility weighted images are normal.  The orbits appear normal.   The VIIth/VIIIth nerve complex appears normal.  The mastoid air cells appear normal.  The paranasal sinuses appear normal.  Flow voids are identified within the major intracerebral arteries.      This MRI of the brain without contrast shows the following: 1.     Scattered T2/FLAIR hyperintense foci in the white matter of the hemispheres consistent with mild chronic microvascular  ischemic change. 2.    There are no acute findings INTERPRETING PHYSICIAN: Richard A. Felecia Shelling, MD, PhD, FAAN Certified in  Neuroimaging by [employer]    Assessment & Plan:  GERD vs allergic rhinitis vs HF exacerbation.  [name] was seen today for cough.  Diagnoses and all orders for this visit:  Acute on chronic diastolic (congestive) heart failure ([place]) -     DG Chest 2 View; Future -     B Nat Peptide; Future -     Basic metabolic panel; Future -     CBC w/Diff; Future -     benzonatate (TESSALON) 100 MG capsule; Take 1 capsule (100 mg total) by mouth 3 (three) times daily as needed for cough. -     ranitidine (ZANTAC) 300 MG tablet; Take 1 tablet (300 mg total) by mouth at bedtime.  Cough with sputum -     DG Chest 2 View; Future -     B Nat Peptide; Future -     Basic metabolic panel; Future -     CBC w/Diff; Future  Post-nasal drip -     benzonatate (TESSALON) 100 MG capsule; Take 1 capsule (100 mg total) by mouth 3 (three) times daily as needed for cough. -     ipratropium (ATROVENT) 0.03 % nasal spray; Place 2 sprays into both nostrils 2 (two) times daily. Do not use for more than 5days.   I am having [name] start on benzonatate, ipratropium, and ranitidine. I am also having her maintain her losartan, spironolactone, torsemide, hydrocortisone, labetalol, amLODipine, traMADol, calcitRIOL, Propylene Glycol, gabapentin, ondansetron, acetaminophen-codeine, PROCRIT, hydrALAZINE, and triamcinolone ointment.  Meds ordered this encounter  Medications  . benzonatate (TESSALON) 100 MG capsule    Sig: Take 1 capsule (100 mg total) by mouth 3 (three) times daily as needed for cough.    Dispense:  20 capsule    Refill:  0    Order Specific Question:   Supervising Provider    Answer:   Cassandria Anger [[id number]]  . ipratropium (ATROVENT) 0.03 % nasal spray    Sig: Place 2 sprays into both nostrils 2 (two) times daily. Do not use for more than 5days.     Dispense:  30 mL    Refill:  0    Order Specific Question:   Supervising Provider    Answer:   Cassandria Anger [[id number]]  . ranitidine (ZANTAC) 300 MG tablet    Sig: Take 1 tablet (300 mg total) by mouth at bedtime.    Dispense:  30 tablet    Refill:  1    Order Specific Question:   Supervising Provider    Answer:   Cassandria Anger [[id number]]    Follow-up: Return if symptoms worsen or fail to improve.  Martha Lacy, NP

## 2016-11-27 NOTE — Patient Instructions (Addendum)
Labs indicates possible CHF exacerbation which will explain cough. Increase torsemide to 3tabs once a day x 3days, then return to 2tabs once a day continuously. Contact cardiology for f/up appt asap.  No acute finding on CXR.

## 2016-11-28 LAB — BRAIN NATRIURETIC PEPTIDE: Pro B Natriuretic peptide (BNP): 202 pg/mL — ABNORMAL HIGH (ref 0.0–100.0)

## 2016-11-29 NOTE — Telephone Encounter (Signed)
Pt called for her lab results from 8/22 Please call back and if you  Do no reach her please leave message

## 2016-12-01 NOTE — Progress Notes (Signed)
Cardiology Office Note   Date:  12/03/2016   ID:  [name], DOB 1945-07-28, MRN [id number]  PCP:  Hoyt Koch, MD  Cardiologist:   Minus Breeding, MD   Chief Complaint  Patient presents with  . Shortness of Breath      History of Present Illness: [name] is a 71 y.o. female who presents for evaluation of an abnormal EKG. She did have an echocardiogram in 2014 which was essentially normal.  She has HTN.  I saw her before a laminectomy.  She had a LBBB.  she had a negative Lexiscan Myoview.   I saw her in May for evaluation of a murmur.   I sent her for an echo and she had moderate AI.  She recently presented with dyspnea.  I reviewed the CXR results and she did not have edema.  However, her BNP was slightly elevated.  She was referred to see me.  She reports that she has been coughing for a couple of weeks.  She has felt like her head was stopped up.  She had no productive sputum.  She feels like she gets into coughing fits and she gets SOB with this.  She feels choked.  She has chronic lower extremity edema and this has not been worse.  She has some trouble lying flat.  She has not had fevers or chills.  She was given extra Torsemide for 3 days without improvement.  She has not had chest pain, neck or arm pain.  She has not had any symptoms consistent with reflux.     Past Medical History:  Diagnosis Date  . Adenomatous colon polyp 01/2002  . Anemia   . CKD (chronic kidney disease), stage III   . Diverticulosis of colon   . DJD (degenerative joint disease)   . Hemorrhoids   . Hypertension   . Lumbar back pain   . Neuropathy   . Obesity   . PONV (postoperative nausea and vomiting)   . Renal cyst   . Shortness of breath dyspnea    with exertion  . Urinary tract infection   . Venous insufficiency   . Vitamin D deficiency     Past Surgical History:  Procedure Laterality Date  . ABDOMINAL HYSTERECTOMY    . COLONOSCOPY       Current Outpatient  Prescriptions  Medication Sig Dispense Refill  . acetaminophen-codeine (TYLENOL #3) 300-30 MG tablet Take 1 tablet by mouth 2 (two) times daily.    Marland Kitchen amLODipine (NORVASC) 10 MG tablet Take 1 tablet (10 mg total) by mouth daily. 30 tablet 0  . benzonatate (TESSALON) 100 MG capsule Take 1 capsule (100 mg total) by mouth 3 (three) times daily as needed for cough. 20 capsule 0  . calcitRIOL (ROCALTROL) 0.25 MCG capsule Take 0.5 mcg by mouth daily.  0  . gabapentin (NEURONTIN) 300 MG capsule Take 1 capsule (300 mg total) by mouth 3 (three) times daily as needed (pain). 90 capsule 3  . hydrALAZINE (APRESOLINE) 50 MG tablet TAKE 1 TABLET BY MOUTH THREE TIMES DAILY 270 tablet 0  . hydrocortisone (ANUSOL-HC) 2.5 % rectal cream Place 1 application rectally as needed. (Patient taking differently: Place 1 application rectally as needed for hemorrhoids. ) 30 g 0  . ipratropium (ATROVENT) 0.03 % nasal spray Place 2 sprays into both nostrils 2 (two) times daily. Do not use for more than 5days. 30 mL 0  . labetalol (NORMODYNE) 200 MG tablet TAKE 2 TABLETS BY MOUTH  TWICE DAILY 120 tablet 0  . losartan (COZAAR) 100 MG tablet TAKE 1 TABLET BY MOUTH EVERY DAY 30 tablet 11  . ondansetron (ZOFRAN) 4 MG tablet Take 1 tablet (4 mg total) by mouth every 8 (eight) hours as needed for nausea or vomiting. 20 tablet 0  . PROCRIT 15176 UNIT/ML injection Inject 40,000 Units into the skin every 14 (fourteen) days.  3  . Propylene Glycol (SYSTANE BALANCE) 0.6 % SOLN Place 1 drop into both eyes daily as needed (for dry eyes).    . ranitidine (ZANTAC) 300 MG tablet Take 1 tablet (300 mg total) by mouth at bedtime. 30 tablet 1  . spironolactone (ALDACTONE) 25 MG tablet Take 1 tablet (25 mg total) by mouth daily. 30 tablet 6  . torsemide (DEMADEX) 20 MG tablet TAKE 2 TABLETS BY MOUTH EVERY MORNING 60 tablet 6  . traMADol (ULTRAM) 50 MG tablet Take 1 tablet (50 mg total) by mouth 3 (three) times daily as needed. 90 tablet 0  .  triamcinolone ointment (KENALOG) 0.5 % Apply 1 application topically 2 (two) times daily. 100 g 3   No current facility-administered medications for this visit.     Allergies:   Minoxidil and Naproxen    ROS:  Please see the history of present illness.   Otherwise, review of systems are none.   All other systems are reviewed and negative.    PHYSICAL EXAM: VS:  BP (!) 162/84   Pulse 87   Ht 5\' 6"  (1.676 m)   Wt 234 lb (106.1 kg)   BMI 37.77 kg/m  , BMI Body mass index is 37.77 kg/m.  GENERAL:  Well appearing NECK:  No jugular venous distention, waveform within normal limits, carotid upstroke brisk and symmetric, no bruits, no thyromegaly LUNGS:  Clear to auscultation bilaterally BACK:  No CVA tenderness CHEST:  Unremarkable HEART:  PMI not displaced or sustained,S1 and S2 within normal limits, no S3, no S4, no clicks, no rubs, 2/6 apical systolic murmur and apical diastolic murmur. ABD:  Flat, positive bowel sounds normal in frequency in pitch, no bruits, no rebound, no guarding, no midline pulsatile mass, no hepatomegaly, no splenomegaly EXT:  2 plus pulses throughout, moderate edema, no cyanosis no clubbing   EKG:  EKG is 87 ordered today. The ekg ordered today demonstrates sinus rhythm, rate 87, incomplete left bundle branch block, left axis deviation. No old EKGs for comparison.   Recent Labs: 11/27/2016: BUN 31; Creatinine, Ser 1.97; Hemoglobin 10.7; Platelets 217.0; Potassium 3.6; Pro B Natriuretic peptide (BNP) 202.0; Sodium 142    Lipid Panel    Component Value Date/Time   CHOL 146 01/10/2012 1404   TRIG 93.0 01/10/2012 1404   HDL 50.20 01/10/2012 1404   CHOLHDL 3 01/10/2012 1404   VLDL 18.6 01/10/2012 1404   LDLCALC 77 01/10/2012 1404      Wt Readings from Last 3 Encounters:  12/02/16 234 lb (106.1 kg)  11/27/16 238 lb (108 kg)  10/03/16 244 lb 9.6 oz (110.9 kg)      Other studies Reviewed: Additional studies/ records that were reviewed today include:    CXR, labs Review of the above records demonstrates:    ASSESSMENT AND PLAN:  DYSPNEA:  This is the acute complaint.  However, I don't strongly suspect that this is related to CHF.  She did not improve with increased diuretic.  There was no evidence of volume overload on CXR.   However, the cardiac silhouette was more prominent and I did have  her do a quick bedside echo and there was pericardial effusion that was slightly increased from previous. I still don't think this is contributing but will get a formal echocardiogram to measure hemodynamics. More probably this is a primary pulmonary issue and I will get her to see a pulmonologist.   AI:  She had some aortic insufficiency and I will follow this up with the echo as above probably be a follow this symptomatically.Marland Kitchen   CAROTID STENOSIS:    There was right 40% stenosis last year and this will be repeated in 2019.   HTN:   Her blood pressures is slightly elevated today. She checks at home is usually well controlled. She'll keep a blood pressure diary.  CKD:  Creatinine was up and her most recent reading. One ovoid over diuresis.  Current medicines are reviewed at length with the patient today.  The patient does not have concerns regarding medicines.  The following changes have been made:   None  Labs/ tests ordered today include:    Orders Placed This Encounter  Procedures  . Ambulatory referral to Pulmonology  . EKG 12-Lead  . ECHOCARDIOGRAM COMPLETE     Disposition:   FU with me in  Months.    Signed, Minus Breeding, MD  12/03/2016 8:45 PM    [facility]

## 2016-12-02 ENCOUNTER — Ambulatory Visit (INDEPENDENT_AMBULATORY_CARE_PROVIDER_SITE_OTHER): Payer: Medicare Other | Admitting: Cardiology

## 2016-12-02 ENCOUNTER — Encounter: Payer: Self-pay | Admitting: Cardiology

## 2016-12-02 VITALS — BP 162/84 | HR 87 | Ht 66.0 in | Wt 234.0 lb

## 2016-12-02 DIAGNOSIS — R0602 Shortness of breath: Secondary | ICD-10-CM | POA: Diagnosis not present

## 2016-12-02 DIAGNOSIS — I313 Pericardial effusion (noninflammatory): Secondary | ICD-10-CM

## 2016-12-02 DIAGNOSIS — I3139 Other pericardial effusion (noninflammatory): Secondary | ICD-10-CM

## 2016-12-02 NOTE — Patient Instructions (Addendum)
Medication Instructions:  Continue current medications  If you need a refill on your cardiac medications before your next appointment, please call your pharmacy.  Labwork: None Ordered  Testing/Procedures: Your physician has requested that you have an echocardiogram. Echocardiography is a painless test that uses sound waves to create images of your heart. It provides your doctor with information about the size and shape of your heart and how well your heart's chambers and valves are working. This procedure takes approximately one hour. There are no restrictions for this procedure.   Follow-Up: Your physician recommends that you schedule a follow-up appointment in: 3 Months.    [facility]  Phone: [phone]   Thank you for choosing [facility] at [facility]!!

## 2016-12-03 ENCOUNTER — Encounter: Payer: Self-pay | Admitting: Cardiology

## 2016-12-04 ENCOUNTER — Telehealth: Payer: Self-pay | Admitting: Nurse Practitioner

## 2016-12-04 NOTE — Telephone Encounter (Signed)
She can maintain appt in October with pulmonology

## 2016-12-04 NOTE — Telephone Encounter (Signed)
Patient states [name] referred her to cardiology to check out patients cough. Patient states Cardiologist does not believe that cough is heart related.  Patient states she has made an appt with Pulmonary but the soonest she could get in was October.  Patient states pulmonary suggested to her for [name] to give them a call to get patient worked in sooner.

## 2016-12-04 NOTE — Telephone Encounter (Signed)
Left vm for pt inform her that we got the massage and we will follow up with her once charlotte comes back tomorrow.   Called Dr. Lamonte Sakai office to see if I can get her in soon but they said the schedule is complete book until October and they advise pt to keep on checking with them for cancellation.   Not sure if Baldo Ash wants to help on this matter.

## 2016-12-05 ENCOUNTER — Ambulatory Visit (HOSPITAL_COMMUNITY): Payer: Medicare Other | Attending: Cardiovascular Disease

## 2016-12-05 ENCOUNTER — Other Ambulatory Visit: Payer: Self-pay

## 2016-12-05 DIAGNOSIS — I5030 Unspecified diastolic (congestive) heart failure: Secondary | ICD-10-CM | POA: Diagnosis not present

## 2016-12-05 DIAGNOSIS — I080 Rheumatic disorders of both mitral and aortic valves: Secondary | ICD-10-CM | POA: Diagnosis not present

## 2016-12-05 DIAGNOSIS — I420 Dilated cardiomyopathy: Secondary | ICD-10-CM | POA: Insufficient documentation

## 2016-12-05 DIAGNOSIS — R0602 Shortness of breath: Secondary | ICD-10-CM

## 2016-12-05 DIAGNOSIS — I3139 Other pericardial effusion (noninflammatory): Secondary | ICD-10-CM

## 2016-12-05 DIAGNOSIS — I313 Pericardial effusion (noninflammatory): Secondary | ICD-10-CM | POA: Diagnosis not present

## 2016-12-06 NOTE — Telephone Encounter (Signed)
Pt is aware.  

## 2016-12-06 NOTE — Telephone Encounter (Signed)
Left vm for pt call back. Need to inform her charlotte's response and advise pt to keep checking with Dr. Agustina Caroli office for cancellation.

## 2016-12-10 DIAGNOSIS — E8770 Fluid overload, unspecified: Secondary | ICD-10-CM | POA: Diagnosis not present

## 2016-12-10 DIAGNOSIS — I129 Hypertensive chronic kidney disease with stage 1 through stage 4 chronic kidney disease, or unspecified chronic kidney disease: Secondary | ICD-10-CM | POA: Diagnosis not present

## 2016-12-10 DIAGNOSIS — N184 Chronic kidney disease, stage 4 (severe): Secondary | ICD-10-CM | POA: Diagnosis not present

## 2016-12-10 DIAGNOSIS — N2581 Secondary hyperparathyroidism of renal origin: Secondary | ICD-10-CM | POA: Diagnosis not present

## 2016-12-10 DIAGNOSIS — D631 Anemia in chronic kidney disease: Secondary | ICD-10-CM | POA: Diagnosis not present

## 2016-12-12 ENCOUNTER — Telehealth: Payer: Self-pay | Admitting: Cardiology

## 2016-12-12 NOTE — Telephone Encounter (Signed)
Returned call to pt leave message for pt to call back

## 2016-12-12 NOTE — Telephone Encounter (Signed)
New message   Pt returning [place] phone call

## 2016-12-13 NOTE — Telephone Encounter (Signed)
Follow up   Pt calling back - pt states leave a detailed message because her phone acts up

## 2016-12-16 ENCOUNTER — Emergency Department (HOSPITAL_COMMUNITY)
Admission: EM | Admit: 2016-12-16 | Discharge: 2016-12-16 | Disposition: A | Discharge status: Home / Self Care | Payer: Medicare Other | Attending: Emergency Medicine | Admitting: Emergency Medicine

## 2016-12-16 ENCOUNTER — Emergency Department (HOSPITAL_COMMUNITY): Payer: Medicare Other

## 2016-12-16 ENCOUNTER — Encounter (HOSPITAL_COMMUNITY): Payer: Self-pay | Admitting: Emergency Medicine

## 2016-12-16 DIAGNOSIS — N390 Urinary tract infection, site not specified: Secondary | ICD-10-CM | POA: Diagnosis not present

## 2016-12-16 DIAGNOSIS — Z87891 Personal history of nicotine dependence: Secondary | ICD-10-CM | POA: Insufficient documentation

## 2016-12-16 DIAGNOSIS — R519 Headache, unspecified: Secondary | ICD-10-CM

## 2016-12-16 DIAGNOSIS — R51 Headache: Secondary | ICD-10-CM | POA: Diagnosis not present

## 2016-12-16 DIAGNOSIS — I700 Atherosclerosis of aorta: Secondary | ICD-10-CM | POA: Diagnosis not present

## 2016-12-16 DIAGNOSIS — E876 Hypokalemia: Secondary | ICD-10-CM | POA: Diagnosis not present

## 2016-12-16 DIAGNOSIS — Z79899 Other long term (current) drug therapy: Secondary | ICD-10-CM | POA: Insufficient documentation

## 2016-12-16 DIAGNOSIS — I10 Essential (primary) hypertension: Secondary | ICD-10-CM | POA: Diagnosis not present

## 2016-12-16 DIAGNOSIS — M542 Cervicalgia: Secondary | ICD-10-CM | POA: Diagnosis not present

## 2016-12-16 LAB — URINALYSIS, ROUTINE W REFLEX MICROSCOPIC
Bilirubin Urine: NEGATIVE
GLUCOSE, UA: NEGATIVE mg/dL
HGB URINE DIPSTICK: NEGATIVE
Ketones, ur: NEGATIVE mg/dL
NITRITE: NEGATIVE
PH: 5 (ref 5.0–8.0)
Protein, ur: 100 mg/dL — AB
SPECIFIC GRAVITY, URINE: 1.015 (ref 1.005–1.030)

## 2016-12-16 LAB — CBC WITH DIFFERENTIAL/PLATELET
BASOS PCT: 0 %
Basophils Absolute: 0 10*3/uL (ref 0.0–0.1)
EOS ABS: 0 10*3/uL (ref 0.0–0.7)
EOS PCT: 0 %
HCT: 33.6 % — ABNORMAL LOW (ref 36.0–46.0)
Hemoglobin: 10.7 g/dL — ABNORMAL LOW (ref 12.0–15.0)
Lymphocytes Relative: 13 %
Lymphs Abs: 1.2 10*3/uL (ref 0.7–4.0)
MCH: 29.4 pg (ref 26.0–34.0)
MCHC: 31.8 g/dL (ref 30.0–36.0)
MCV: 92.3 fL (ref 78.0–100.0)
MONO ABS: 0.9 10*3/uL (ref 0.1–1.0)
MONOS PCT: 10 %
Neutro Abs: 7.3 10*3/uL (ref 1.7–7.7)
Neutrophils Relative %: 77 %
Platelets: 210 10*3/uL (ref 150–400)
RBC: 3.64 MIL/uL — ABNORMAL LOW (ref 3.87–5.11)
RDW: 16.7 % — AB (ref 11.5–15.5)
WBC: 9.5 10*3/uL (ref 4.0–10.5)

## 2016-12-16 LAB — BASIC METABOLIC PANEL
Anion gap: 12 (ref 5–15)
BUN: 27 mg/dL — ABNORMAL HIGH (ref 6–20)
CALCIUM: 9.9 mg/dL (ref 8.9–10.3)
CO2: 31 mmol/L (ref 22–32)
CREATININE: 1.67 mg/dL — AB (ref 0.44–1.00)
Chloride: 97 mmol/L — ABNORMAL LOW (ref 101–111)
GFR calc non Af Amer: 30 mL/min — ABNORMAL LOW (ref >60)
GFR, EST AFRICAN AMERICAN: 34 mL/min — AB (ref >60)
Glucose, Bld: 128 mg/dL — ABNORMAL HIGH (ref 65–99)
Potassium: 3.1 mmol/L — ABNORMAL LOW (ref 3.5–5.1)
SODIUM: 140 mmol/L (ref 135–145)

## 2016-12-16 LAB — CSF CELL COUNT WITH DIFFERENTIAL
RBC COUNT CSF: 21 /mm3 — AB
Tube #: 4
WBC CSF: 1 /mm3 (ref 0–5)

## 2016-12-16 LAB — PROTEIN, CSF: Total  Protein, CSF: 32 mg/dL (ref 15–45)

## 2016-12-16 LAB — GLUCOSE, CSF: Glucose, CSF: 78 mg/dL — ABNORMAL HIGH (ref 40–70)

## 2016-12-16 LAB — I-STAT CG4 LACTIC ACID, ED: Lactic Acid, Venous: 0.74 mmol/L (ref 0.5–1.9)

## 2016-12-16 LAB — MAGNESIUM: MAGNESIUM: 1.4 mg/dL — AB (ref 1.7–2.4)

## 2016-12-16 MED ORDER — MAGNESIUM SULFATE 2 GM/50ML IV SOLN
2.0000 g | Freq: Once | INTRAVENOUS | Status: AC
  Administered 2016-12-16: 2 g via INTRAVENOUS
  Filled 2016-12-16: qty 50

## 2016-12-16 MED ORDER — TRAMADOL HCL 50 MG PO TABS: 50.0000 mg | ORAL_TABLET | Freq: Three times a day (TID) | ORAL | 0 refills | Status: DC | PRN

## 2016-12-16 MED ORDER — FENTANYL CITRATE (PF) 100 MCG/2ML IJ SOLN
50.0000 ug | Freq: Once | INTRAMUSCULAR | Status: AC
  Administered 2016-12-16: 50 ug via INTRAVENOUS
  Filled 2016-12-16: qty 2

## 2016-12-16 MED ORDER — HYDRALAZINE HCL 50 MG PO TABS
50.0000 mg | ORAL_TABLET | Freq: Once | ORAL | Status: AC
  Administered 2016-12-16: 50 mg via ORAL
  Filled 2016-12-16: qty 1

## 2016-12-16 MED ORDER — LORAZEPAM 2 MG/ML IJ SOLN: 1.0000 mg | Freq: Once | INTRAMUSCULAR | Status: DC

## 2016-12-16 MED ORDER — LIDOCAINE HCL (PF) 1 % IJ SOLN
5.0000 mL | Freq: Once | INTRAMUSCULAR | Status: AC
  Administered 2016-12-16: 5 mL via INTRADERMAL
  Filled 2016-12-16: qty 30

## 2016-12-16 MED ORDER — AMLODIPINE BESYLATE 5 MG PO TABS
10.0000 mg | ORAL_TABLET | Freq: Once | ORAL | Status: AC
  Administered 2016-12-16: 10 mg via ORAL
  Filled 2016-12-16: qty 2

## 2016-12-16 MED ORDER — METHOCARBAMOL 1000 MG/10ML IJ SOLN: 500.0000 mg | Freq: Once | INTRAMUSCULAR | Status: DC

## 2016-12-16 MED ORDER — LIDOCAINE HCL (PF) 1 % IJ SOLN
10.0000 mL | Freq: Once | INTRAMUSCULAR | Status: AC
  Administered 2016-12-16: 3 mL

## 2016-12-16 MED ORDER — METHOCARBAMOL 1000 MG/10ML IJ SOLN
500.0000 mg | Freq: Once | INTRAVENOUS | Status: AC
  Administered 2016-12-16: 500 mg via INTRAVENOUS
  Filled 2016-12-16: qty 550

## 2016-12-16 MED ORDER — FENTANYL CITRATE (PF) 100 MCG/2ML IJ SOLN
50.0000 ug | Freq: Once | INTRAMUSCULAR | Status: DC
  Filled 2016-12-16: qty 2

## 2016-12-16 MED ORDER — POTASSIUM CHLORIDE CRYS ER 20 MEQ PO TBCR
40.0000 meq | EXTENDED_RELEASE_TABLET | Freq: Once | ORAL | Status: AC
  Administered 2016-12-16: 40 meq via ORAL
  Filled 2016-12-16: qty 2

## 2016-12-16 MED ORDER — CEFPODOXIME PROXETIL 200 MG PO TABS: 200.0000 mg | ORAL_TABLET | Freq: Two times a day (BID) | ORAL | 0 refills | Status: AC

## 2016-12-16 MED ORDER — LABETALOL HCL 200 MG PO TABS
400.0000 mg | ORAL_TABLET | Freq: Once | ORAL | Status: AC
  Administered 2016-12-16: 400 mg via ORAL
  Filled 2016-12-16: qty 2

## 2016-12-16 MED ORDER — DEXTROSE 5 % IV SOLN
2.0000 g | Freq: Once | INTRAVENOUS | Status: AC
  Administered 2016-12-16: 2 g via INTRAVENOUS
  Filled 2016-12-16: qty 2

## 2016-12-16 MED ORDER — ACETAMINOPHEN 500 MG PO TABS
1000.0000 mg | ORAL_TABLET | Freq: Once | ORAL | Status: AC
  Administered 2016-12-16: 1000 mg via ORAL
  Filled 2016-12-16: qty 2

## 2016-12-16 NOTE — ED Provider Notes (Signed)
Attempted bedside ultrasound. Pt is 71 yo F here with fever, neck pain. Her fever may be 2/2 UTI but given her only sx relate to HA and neck pain, need to rule out concomitant or primary meningitis. Will consult IR. Attempted x 3 without success, but pt tolerated well.   Duffy Bruce, MD 12/16/16 1240

## 2016-12-16 NOTE — Discharge Instructions (Signed)
Start taking ONE POTASSIUM TABLET a day for the next week, then have your labs checked.

## 2016-12-16 NOTE — Procedures (Signed)
Procedure: LP w fluoro guidance at L2-3. Specimen: CSF - to lab Bleeding: minimal. Complications: None immediate. Patient   -Condition: Stable.  -Disposition:  Return to ED.  Full Radiology Report to follow under IMAGING

## 2016-12-16 NOTE — ED Provider Notes (Signed)
[facility] DEPT Provider Note   CSN: [id number] Arrival date & time: 12/16/16  9528     History   Chief Complaint Chief Complaint  Patient presents with  . Neck Pain    HPI [name] is a 71 y.o. female.  HPI   71 yo F with PMHx CKD, HTN, HLD here with neck pain. Pt reports that her sx started Friday. She fell asleep on the couch instead of her bed and upon awakening, has had persistent left-sided neck pain. Pain is aching, throbbing, and cramp like and feels like a "crick" that she cannot work out. Pain is worse with palpation and rotation of the head. She denies any associated HA, photophobia, or phonophobia. No sore throat. No cough. No other medical complaints. The pain radiates down from her left neck to her left arm. No focal numbness or weakness. No recent falls. Pain did improve somewhat with heating pad.  Past Medical History:  Diagnosis Date  . Adenomatous colon polyp 01/2002  . Anemia   . CKD (chronic kidney disease), stage III   . Diverticulosis of colon   . DJD (degenerative joint disease)   . Hemorrhoids   . Hypertension   . Lumbar back pain   . Neuropathy   . Obesity   . PONV (postoperative nausea and vomiting)   . Renal cyst   . Shortness of breath dyspnea    with exertion  . Urinary tract infection   . Venous insufficiency   . Vitamin D deficiency     Patient Active Problem List   Diagnosis Date Noted  . Stenosis of right carotid artery 08/27/2016  . LBBB (left bundle branch block) 08/27/2016  . Left-sided tinnitus 08/16/2016  . Anemia in chronic kidney disease 11/02/2015  . CKD (chronic kidney disease) stage 3, GFR 30-59 ml/min 04/26/2014  . Diastolic congestive heart failure ([place]) 04/26/2014  . Knee pain, bilateral 03/23/2012  . Murmur 02/24/2012  . VITAMIN D DEFICIENCY 04/13/2008  . OBESITY 02/23/2008  . Radiculopathy 02/23/2008  . Essential hypertension 02/23/2008  . Venous (peripheral) insufficiency 02/23/2008  . BACK PAIN,  LUMBAR 02/23/2008    Past Surgical History:  Procedure Laterality Date  . ABDOMINAL HYSTERECTOMY    . COLONOSCOPY      OB History    No data available       Home Medications    Prior to Admission medications   Medication Sig Start Date End Date Taking? Authorizing Provider  acetaminophen-codeine (TYLENOL #3) 300-30 MG tablet Take 1 tablet by mouth 2 (two) times daily. 07/15/16  Yes [provider]  amLODipine (NORVASC) 10 MG tablet Take 1 tablet (10 mg total) by mouth daily. 05/01/14  Yes Velvet Bathe, MD  benzonatate (TESSALON) 100 MG capsule Take 1 capsule (100 mg total) by mouth 3 (three) times daily as needed for cough. 11/27/16  Yes Nche, Charlene Brooke, NP  calcitRIOL (ROCALTROL) 0.25 MCG capsule Take 0.5 mcg by mouth daily. 06/14/15  Yes [provider]  gabapentin (NEURONTIN) 300 MG capsule Take 1 capsule (300 mg total) by mouth 3 (three) times daily as needed (pain). 12/08/15  Yes Hoyt Koch, MD  hydrALAZINE (APRESOLINE) 50 MG tablet TAKE 1 TABLET BY MOUTH THREE TIMES DAILY 08/06/16  Yes Hoyt Koch, MD  ipratropium (ATROVENT) 0.03 % nasal spray Place 2 sprays into both nostrils 2 (two) times daily. Do not use for more than 5days. 11/27/16  Yes Nche, Charlene Brooke, NP  labetalol (NORMODYNE) 200 MG tablet TAKE 2 TABLETS  BY MOUTH TWICE DAILY   Yes Noralee Space, MD  losartan (COZAAR) 100 MG tablet TAKE 1 TABLET BY MOUTH EVERY DAY 02/27/12  Yes Noralee Space, MD  PROCRIT 62694 UNIT/ML injection Inject 40,000 Units into the skin every 14 (fourteen) days. 06/26/16  Yes [provider]  Propylene Glycol (SYSTANE BALANCE) 0.6 % SOLN Place 1 drop into both eyes daily as needed (for dry eyes).   Yes [provider]  spironolactone (ALDACTONE) 25 MG tablet Take 1 tablet (25 mg total) by mouth daily. 03/23/12  Yes Noralee Space, MD  torsemide (DEMADEX) 20 MG tablet TAKE 2 TABLETS BY MOUTH EVERY MORNING 01/18/13  Yes Noralee Space, MD    triamcinolone ointment (KENALOG) 0.5 % Apply 1 application topically 2 (two) times daily. 08/15/16  Yes Hoyt Koch, MD  cefpodoxime (VANTIN) 200 MG tablet Take 1 tablet (200 mg total) by mouth 2 (two) times daily. 12/16/16 12/26/16  Duffy Bruce, MD  hydrocortisone (ANUSOL-HC) 2.5 % rectal cream Place 1 application rectally as needed. Patient not taking: Reported on 12/16/2016 05/24/13   Noralee Space, MD  ondansetron (ZOFRAN) 4 MG tablet Take 1 tablet (4 mg total) by mouth every 8 (eight) hours as needed for nausea or vomiting. Patient not taking: Reported on 12/16/2016 01/04/16   Hoyt Koch, MD  ranitidine (ZANTAC) 300 MG tablet Take 1 tablet (300 mg total) by mouth at bedtime. Patient not taking: Reported on 12/16/2016 11/27/16   Nche, Charlene Brooke, NP  traMADol (ULTRAM) 50 MG tablet Take 1 tablet (50 mg total) by mouth 3 (three) times daily as needed for severe pain. 12/16/16   Duffy Bruce, MD    Family History Family History  Problem Relation Age of Onset  . Diabetes Mother   . Colon polyps Mother   . Hypertension Mother   . Hypertension Brother   . Hypertension Sister   . Colon cancer Neg Hx     Social History Social History  Substance Use Topics  . Smoking status: Former Smoker    Packs/day: 0.40    Years: 15.00    Types: Cigarettes    Quit date: 04/08/1982  . Smokeless tobacco: Never Used  . Alcohol use No     Allergies   Minoxidil and Naproxen   Review of Systems Review of Systems  Constitutional: Positive for fatigue and fever. Negative for chills.  HENT: Negative for congestion and rhinorrhea.   Eyes: Negative for visual disturbance.  Respiratory: Negative for cough, shortness of breath and wheezing.   Cardiovascular: Negative for chest pain and leg swelling.  Gastrointestinal: Negative for abdominal pain, diarrhea, nausea and vomiting.  Genitourinary: Negative for dysuria and flank pain.  Musculoskeletal: Positive for neck pain and neck  stiffness.  Skin: Negative for rash and wound.  Allergic/Immunologic: Negative for immunocompromised state.  Neurological: Positive for weakness. Negative for syncope and headaches.  All other systems reviewed and are negative.    Physical Exam Updated Vital Signs BP 135/88 (BP Location: Right Arm)   Pulse 87   Temp 98.5 F (36.9 C) (Oral)   Resp 18   Ht 5\' 6"  (1.676 m)   Wt 105.7 kg (233 lb)   SpO2 99%   BMI 37.61 kg/m   Physical Exam  Constitutional: She is oriented to person, place, and time. She appears well-developed and well-nourished. No distress.  HENT:  Head: Normocephalic and atraumatic.  Mouth/Throat: Oropharynx is clear and moist.  Eyes: Conjunctivae are normal.  Neck: Neck  supple.    Cardiovascular: Normal rate, regular rhythm and normal heart sounds.  Exam reveals no friction rub.   No murmur heard. Pulmonary/Chest: Effort normal and breath sounds normal. No respiratory distress. She has no wheezes. She has no rales.  Abdominal: She exhibits no distension.  Musculoskeletal: She exhibits no edema.  Neurological: She is alert and oriented to person, place, and time. She exhibits normal muscle tone.  Skin: Skin is warm. Capillary refill takes less than 2 seconds.  Psychiatric: She has a normal mood and affect.  Nursing note and vitals reviewed.    ED Treatments / Results  Labs (all labs ordered are listed, but only abnormal results are displayed) Labs Reviewed  CBC WITH DIFFERENTIAL/PLATELET - Abnormal; Notable for the following:       Result Value   RBC 3.64 (*)    Hemoglobin 10.7 (*)    HCT 33.6 (*)    RDW 16.7 (*)    All other components within normal limits  BASIC METABOLIC PANEL - Abnormal; Notable for the following:    Potassium 3.1 (*)    Chloride 97 (*)    Glucose, Bld 128 (*)    BUN 27 (*)    Creatinine, Ser 1.67 (*)    GFR calc non Af Amer 30 (*)    GFR calc Af Amer 34 (*)    All other components within normal limits  MAGNESIUM -  Abnormal; Notable for the following:    Magnesium 1.4 (*)    All other components within normal limits  URINALYSIS, ROUTINE W REFLEX MICROSCOPIC - Abnormal; Notable for the following:    APPearance HAZY (*)    Protein, ur 100 (*)    Leukocytes, UA MODERATE (*)    Bacteria, UA RARE (*)    Squamous Epithelial / LPF 0-5 (*)    All other components within normal limits  GLUCOSE, CSF - Abnormal; Notable for the following:    Glucose, CSF 78 (*)    All other components within normal limits  CSF CELL COUNT WITH DIFFERENTIAL - Abnormal; Notable for the following:    RBC Count, CSF 21 (*)    All other components within normal limits  CSF CULTURE  CULTURE, BLOOD (ROUTINE X 2)  CULTURE, BLOOD (ROUTINE X 2)  URINE CULTURE  PROTEIN, CSF  I-STAT CG4 LACTIC ACID, ED  I-STAT CG4 LACTIC ACID, ED    EKG  EKG Interpretation None       Radiology Dg Chest 2 View  Result Date: 12/16/2016 CLINICAL DATA:  Fever.  Hypertension. EXAM: CHEST  2 VIEW COMPARISON:  November 27, 2016 FINDINGS: There is no edema or consolidation. There is cardiomegaly with pulmonary venous hypertension. No adenopathy. There is aortic atherosclerosis. There is degenerative change in thoracic spine. IMPRESSION: Cardiomegaly with pulmonary vascular congestion. No edema or consolidation. Cardiac silhouette is stable compared to recent study. There is aortic atherosclerosis. Aortic Atherosclerosis (ICD10-I70.0). Electronically Signed   By: Lowella Grip III M.D.   On: 12/16/2016 09:11   Dg Cervical Spine Complete  Result Date: 12/16/2016 CLINICAL DATA:  Left-sided neck stiffness.  No noted injury. EXAM: CERVICAL SPINE - COMPLETE 4+ VIEW COMPARISON:  CT cervical myelogram 05/11/2015 FINDINGS: There is limited visualization of the mid lower cervical spine in the lateral projection due to chronic kyphoscoliotic changes. There is diffuse cervical facet arthropathy with 2 mm of C3-4 and C4-5 anterolisthesis. Mild generalized disc  narrowing in the cervical spine. There is partly seen advanced thoracic disc narrowing and endplate  spurring in the thoracic spine. Foraminal characterization is limited by positioning and overlap. No suspected change compared to February 2017 myelogram. Partly seen cardiomegaly. Prominent vascular channels within the calvarium, status post recent brain MRI. IMPRESSION: 1. No acute finding. 2. Diffuse degenerative cervical facet arthropathy with chronic C3-4 and C4-5 anterolisthesis. Electronically Signed   By: Monte Fantasia M.D.   On: 12/16/2016 09:15   Ct Head Wo Contrast  Result Date: 12/16/2016 CLINICAL DATA:  71 year old female with a history of posterior neck pain EXAM: CT HEAD WITHOUT CONTRAST CT CERVICAL SPINE WITHOUT CONTRAST TECHNIQUE: Multidetector CT imaging of the head and cervical spine was performed following the standard protocol without intravenous contrast. Multiplanar CT image reconstructions of the cervical spine were also generated. COMPARISON:  05/11/2015 FINDINGS: CT HEAD FINDINGS Brain: No acute intracranial hemorrhage. No midline shift or mass effect. Gray-white differentiation maintained. Unremarkable appearance of the ventricular system. Vascular: Unremarkable. Skull: No acute fracture.  No aggressive bone lesion identified. Sinuses/Orbits: Bilateral proptosis.  No paranasal sinus disease. Other: None CT CERVICAL SPINE FINDINGS Alignment: Craniocervical junction aligned. Anatomic alignment of the cervical elements. No subluxation. Skull base and vertebrae: No acute fracture at the skullbase. Vertebral body heights relatively maintained. No acute fracture identified. Soft tissues and spinal canal: Unremarkable cervical soft tissues. Lymph nodes are present, though not enlarged. Disc levels: Disc space narrowing at C3-C4 with right-sided facet disease and uncovertebral joint disease contributing to mild foraminal narrowing. Degenerative disc disease of C4-C5 with right greater than  left facet disease and uncovertebral joint disease contributing to mild foraminal narrowing. Disc space narrowing with disc disease at C5-C6 with left greater than right facet disease and uncovertebral joint disease contributing to moderate foraminal narrowing. Disc space narrowing at C6-C7 with bilateral facet disease and uncovertebral joint disease without significant foraminal narrowing. Degenerative disc disease at C7-T1. Upper chest: Unremarkable appearance of the lung apices. Other: No bony canal narrowing. IMPRESSION: Head CT: No CT evidence of acute intracranial abnormality. Bilateral proptosis. Correlation with thyroid lab studies may be useful. Cervical CT: No CT evidence of acute fracture malalignment of the cervical spine. Multilevel disc disease and facet disease, contributing to moderate foraminal narrowing on the left at C5-C6, and mild foraminal narrowing on the right at C3-C4, C4-C5, C5-C6. Electronically Signed   By: Corrie Mckusick D.O.   On: 12/16/2016 11:00   Ct Cervical Spine Wo Contrast  Result Date: 12/16/2016 CLINICAL DATA:  71 year old female with a history of posterior neck pain EXAM: CT HEAD WITHOUT CONTRAST CT CERVICAL SPINE WITHOUT CONTRAST TECHNIQUE: Multidetector CT imaging of the head and cervical spine was performed following the standard protocol without intravenous contrast. Multiplanar CT image reconstructions of the cervical spine were also generated. COMPARISON:  05/11/2015 FINDINGS: CT HEAD FINDINGS Brain: No acute intracranial hemorrhage. No midline shift or mass effect. Gray-white differentiation maintained. Unremarkable appearance of the ventricular system. Vascular: Unremarkable. Skull: No acute fracture.  No aggressive bone lesion identified. Sinuses/Orbits: Bilateral proptosis.  No paranasal sinus disease. Other: None CT CERVICAL SPINE FINDINGS Alignment: Craniocervical junction aligned. Anatomic alignment of the cervical elements. No subluxation. Skull base and  vertebrae: No acute fracture at the skullbase. Vertebral body heights relatively maintained. No acute fracture identified. Soft tissues and spinal canal: Unremarkable cervical soft tissues. Lymph nodes are present, though not enlarged. Disc levels: Disc space narrowing at C3-C4 with right-sided facet disease and uncovertebral joint disease contributing to mild foraminal narrowing. Degenerative disc disease of C4-C5 with right greater than left facet disease and  uncovertebral joint disease contributing to mild foraminal narrowing. Disc space narrowing with disc disease at C5-C6 with left greater than right facet disease and uncovertebral joint disease contributing to moderate foraminal narrowing. Disc space narrowing at C6-C7 with bilateral facet disease and uncovertebral joint disease without significant foraminal narrowing. Degenerative disc disease at C7-T1. Upper chest: Unremarkable appearance of the lung apices. Other: No bony canal narrowing. IMPRESSION: Head CT: No CT evidence of acute intracranial abnormality. Bilateral proptosis. Correlation with thyroid lab studies may be useful. Cervical CT: No CT evidence of acute fracture malalignment of the cervical spine. Multilevel disc disease and facet disease, contributing to moderate foraminal narrowing on the left at C5-C6, and mild foraminal narrowing on the right at C3-C4, C4-C5, C5-C6. Electronically Signed   By: Corrie Mckusick D.O.   On: 12/16/2016 11:00   Dg Fluoro Guide Lumbar Puncture  Result Date: 12/16/2016 CLINICAL DATA:  71 year old female with new headache and neck spasm. Unsuccessful bedside lumbar puncture in the emergency department. EXAM: DIAGNOSTIC LUMBAR PUNCTURE UNDER FLUOROSCOPIC GUIDANCE FLUOROSCOPY TIME:  Fluoroscopy Time:  0 minutes 12 seconds Radiation Exposure Index (if provided by the fluoroscopic device): 5.56 mGy Number of Acquired Spot Images: 0 PROCEDURE: Informed consent was obtained from the patient prior to the procedure,  including potential complications of headache, allergy, and pain. A "time-out" was performed. With the patient prone, the lower back was prepped with Betadine. 1% Lidocaine was used for local anesthesia. Lumbar puncture was performed at the L2-L3 level using a 3.5 inch x 20 gauge needle with return of minimally blood tinged initially but subsequently clear appearing CSF with an opening pressure of 24 cm water. 13 mL of CSF were obtained for laboratory studies. The needle was withdrawn, direct pressure held and hemostasis noted. The patient tolerated the procedure well and there were no apparent complications. Appropriate post procedural orders were placed on the chart. The patient was returned to the ED in stable condition for continued evaluation and treatment. IMPRESSION: Fluoroscopic guided lumbar puncture at L2-L3. Opening pressure 24 cm of water. 13 mL of CSF obtained for laboratory studies. Electronically Signed   By: Genevie Ann M.D.   On: 12/16/2016 14:17    Procedures .Lumbar Puncture Date/Time: 12/16/2016 7:48 PM Performed by: Duffy Bruce Authorized by: Duffy Bruce   Consent:    Consent obtained:  Written   Consent given by:  Patient   Risks discussed:  Bleeding, headache, infection, pain, repeat procedure and nerve damage   Alternatives discussed:  No treatment Pre-procedure details:    Procedure purpose:  Diagnostic   Preparation: Patient was prepped and draped in usual sterile fashion   Anesthesia (see MAR for exact dosages):    Anesthesia method:  Local infiltration   Local anesthetic:  Lidocaine 1% w/o epi Procedure details:    Lumbar space:  L4-L5 interspace   Patient position:  Sitting   Needle gauge:  20   Needle type:  Spinal needle - Quincke tip   Needle length (in):  3.5   Ultrasound guidance: no     Number of attempts:  3   Total volume (ml):  0 Post-procedure:    Puncture site:  Adhesive bandage applied and direct pressure applied   Patient tolerance of  procedure:  Tolerated well, no immediate complications Comments:     Unsuccessful attempt x 3. No bleeding or immediate complications.    (including critical care time)  Medications Ordered in ED Medications  fentaNYL (SUBLIMAZE) injection 50 mcg (50 mcg Intravenous Given  12/16/16 0816)  amLODipine (NORVASC) tablet 10 mg (10 mg Oral Given 12/16/16 0821)  hydrALAZINE (APRESOLINE) tablet 50 mg (50 mg Oral Given 12/16/16 0815)  labetalol (NORMODYNE) tablet 400 mg (400 mg Oral Given 12/16/16 0815)  acetaminophen (TYLENOL) tablet 1,000 mg (1,000 mg Oral Given 12/16/16 0814)  methocarbamol (ROBAXIN) 500 mg in dextrose 5 % 50 mL IVPB (0 mg Intravenous Stopped 12/16/16 0913)  potassium chloride SA (K-DUR,KLOR-CON) CR tablet 40 mEq (40 mEq Oral Given 12/16/16 0957)  magnesium sulfate IVPB 2 g 50 mL (0 g Intravenous Stopped 12/16/16 1056)  lidocaine (PF) (XYLOCAINE) 1 % injection 5 mL (5 mLs Intradermal Given 12/16/16 1207)  cefTRIAXone (ROCEPHIN) 2 g in dextrose 5 % 50 mL IVPB (0 g Intravenous Stopped 12/16/16 1635)  lidocaine (PF) (XYLOCAINE) 1 % injection 10 mL (3 mLs Other Given 12/16/16 1348)  potassium chloride SA (K-DUR,KLOR-CON) CR tablet 40 mEq (40 mEq Oral Given 12/16/16 1550)     Initial Impression / Assessment and Plan / ED Course  I have reviewed the triage vital signs and the nursing notes.  Pertinent labs & imaging results that were available during my care of the patient were reviewed by me and considered in my medical decision making (see chart for details).    71 yo F here with fever, neck pain, general chills. Initial DDx broad. Regarding her neck pain, my primary suspicion is torticollis 2/2 paraspinal spasm, possibly 2/2 underlying dehydration, hypo/hyperkalemia, mag or other lyte abnormalities. However, given her fever, cannot rule out meningitis. No AMS or signs of encephalitis. Neuro exam is non-focal. Pt is o/w well appearing. No cough, abd pain, or s/s to suggest PNA, intra-abd  emergency. She does have some urinary sx but no CVAT. Will check imaging, labs, and obtain LP.  Labs, imaging as above and are overall very reassuring. WBC normal. BMP with mild hypoK, hypomag - this could be contributing to her cramping. LA normal. LP attempted and unsuccesful by myself. IR consulted.  LP performed by radiology, tolerated well. She feels markedly improed. She does have a UTI which could explain her fever. Pt given rocephin, will d/c with abx, encourage fluids, K rep, and d/c home.  Final Clinical Impressions(s) / ED Diagnoses   Final diagnoses:  Headache  Hypokalemia  Hypomagnesemia  Lower urinary tract infection    New Prescriptions Discharge Medication List as of 12/16/2016  3:43 PM    START taking these medications   Details  cefpodoxime (VANTIN) 200 MG tablet Take 1 tablet (200 mg total) by mouth 2 (two) times daily., Starting Mon 12/16/2016, Until Thu 12/26/2016, Print         Duffy Bruce, MD 12/16/16 [phone]

## 2016-12-16 NOTE — ED Triage Notes (Signed)
Pt reports having pain in posterior neck and into posterior head. Pt currently alert and oriented x 4. Pt reports some relief when heat applied. Pt reports not taking medication this morning for HTN.

## 2016-12-18 LAB — URINE CULTURE
Culture: >=100000 — AB
SPECIAL REQUESTS: NORMAL

## 2016-12-20 ENCOUNTER — Telehealth: Payer: Self-pay | Admitting: Emergency Medicine

## 2016-12-20 LAB — CSF CULTURE W GRAM STAIN
Culture: NO GROWTH
Gram Stain: NONE SEEN

## 2016-12-20 NOTE — Telephone Encounter (Signed)
Post ED Visit - Positive Culture Follow-up  Culture report reviewed by antimicrobial stewardship pharmacist:  []  Elenor Quinones, Pharm.D. []  Heide Guile, Pharm.D., BCPS AQ-ID []  Parks Neptune, Pharm.D., BCPS []  Alycia Rossetti, Pharm.D., BCPS []  [place], [place].D., BCPS, AAHIVP []  Legrand Como, Pharm.D., BCPS, AAHIVP []  Salome Arnt, PharmD, BCPS []  Dimitri Ped, PharmD, BCPS [x]  Vincenza Hews, PharmD, BCPS  Positive urine culture Treated with cefpodoxime, organism sensitive to the same and no further patient follow-up is required at this time.  Hazle Nordmann 12/20/2016, 9:03 AM

## 2016-12-21 LAB — CULTURE, BLOOD (ROUTINE X 2)
CULTURE: NO GROWTH
CULTURE: NO GROWTH
SPECIAL REQUESTS: ADEQUATE
Special Requests: ADEQUATE

## 2016-12-23 ENCOUNTER — Ambulatory Visit (INDEPENDENT_AMBULATORY_CARE_PROVIDER_SITE_OTHER): Payer: Medicare Other | Admitting: Neurology

## 2016-12-23 ENCOUNTER — Encounter: Payer: Self-pay | Admitting: Neurology

## 2016-12-23 VITALS — BP 195/89 | HR 86 | Ht 66.0 in | Wt 234.2 lb

## 2016-12-23 DIAGNOSIS — R29898 Other symptoms and signs involving the musculoskeletal system: Secondary | ICD-10-CM | POA: Diagnosis not present

## 2016-12-23 DIAGNOSIS — M5412 Radiculopathy, cervical region: Secondary | ICD-10-CM

## 2016-12-23 DIAGNOSIS — M5382 Other specified dorsopathies, cervical region: Secondary | ICD-10-CM

## 2016-12-23 DIAGNOSIS — M542 Cervicalgia: Secondary | ICD-10-CM | POA: Diagnosis not present

## 2016-12-23 DIAGNOSIS — R202 Paresthesia of skin: Secondary | ICD-10-CM | POA: Diagnosis not present

## 2016-12-23 MED ORDER — PREDNISONE 20 MG PO TABS: 20.0000 mg | ORAL_TABLET | Freq: Every day | ORAL | 0 refills | Status: DC

## 2016-12-23 NOTE — Progress Notes (Signed)
[facility]    Provider:  Dr Jaynee Eagles Referring Provider: Hoyt Koch, * Primary Care Physician:  Hoyt Koch, MD CC:  Facial pain  Interval history 12/23/2016: New problem, neck pain, pain into posterior neck and head.She fell asleep on the couch instead of bed and woke up with neck pain, she woke up with "a catch" in the neck and stiffness, worse with palpation and rotation of the head. The stiffness moved ot the back of the head, got worse like it was "grabbing her head" in the back. They went to the emergency room for severe neck pain, every bump they hit in the car triggered spasms. She used to see Dr. Joya Salm. The tinglingin her neck was diagnosed as a pinched nerve in the past. She can't lift her head up.She is on Tramadol. She tried muscle relaxer. LP did not show meningitis or anything abnormal.  She does not have diabetes. She has new arm weakness, CT of the head and neck were unremarkable need MRi brain.   Personally reviewed imaging of neck and agree with following CT:  Multilevel disc disease and facet disease, contributing to moderate foraminal narrowing on the left at C5-C6, and mild foraminal narrowing on the right at C3-C4, C4-C5, C5-C6.   HPI:  [name] is a 71 y.o. female here as a referral from Dr. Sharlet Salina for facial pain. Patient has a past medical history of chronic kidney disease, hypertension, neuropathy, obesity, urinary tract infection. The pain starts on the left forehead/temple to the ear and feels like a hot burning sensation. Episodic. She has high blood pressure and she has been spacing out her medication which helps. She has chronic neck pain "a crook" started on the left side and radiated to the top of the head. She saw cardiology and carotids were a little blocked but ok. She says she has a pinched nerve in the neck and follows with orthopaedics. 2 years she had the sensation in the setting of very high blood pressure and  being treated in the hospital. She would then have it every now and then since then. In May started getting worse and went to the hospital. She feels better with changes in medicine. Now the pain is not often last time she had it was Sunday, a mild burning sensation on the left temple area and radiates to the ear and left arm. In April it lasted all day and she had to go to the emergency room. No dizziness, no weakness, no vision changes, no changes in speech or mentation or any other focal neurologic disorder. No other focal neurologic deficits, associated symptoms, inciting events or modifiable factors.  Reviewed notes, labs and imaging from outside physicians, which showed:  Reviewed labs CBC 08/04/2016 showed mild anemia hemoglobin 11.7, BMP showed slightly elevated glucose 133, BUN 38, creatinine 2.33 and GFR of 20 otherwise normal.  The primary care notes. She is a 71 year old female who had sudden onset unilateral tinnitus. Patient was seen 08/14/2016 and 2 days prior she noticed ringing in her left ear. It fluctuates slightly in intensity but is not pulsatile. No issues with her right ear. She does think her hearing is probably gradually worsened over time but nothing acute. No vertigo or dizziness. She did have a recent right maxillary tooth extraction which is still painful. She also describes some left temporal and mid facial burning and tingling. She has some neck and back pain and some radiating neurologic symptoms down her left arm. She has  seen Dr. Peggye Form who did not think this is a surgical indication. Exam showed normal mental status, hearing well, voice is clear, cranial nerves intact, ear canals clear normal tympanic membranes, she has a slight bit of accumulated skin in the medial canal on the right side, anterior notices moistened patent, neck unremarkable. Pure tone audiometry shows basically symmetric hearing starting her on 5020 dB in both ears then dropping off gradually and  higher frequencies. Discrimination is 96% each side. Tympanograms normally side.  Review of Systems: Patient complains of symptoms per HPI as well as the following symptoms: numbness, tingling, SOB with exertion, weight gain. Pertinent negatives and positives per HPI. All others negative.  Social History   Social History  . Marital status: Married    Spouse name: Juanda Crumble  . Number of children: 1  . Years of education: 34   Occupational History  . retired from [employer], [employer]    Social History Main Topics  . Smoking status: Former Smoker    Packs/day: 0.40    Years: 15.00    Types: Cigarettes    Quit date: 04/08/1982  . Smokeless tobacco: Never Used  . Alcohol use No  . Drug use: No  . Sexual activity: Not on file   Other Topics Concern  . Not on file   Social History Narrative   Lives at home w/ her husband   Right-handed   Daily caffeine     Family History  Problem Relation Age of Onset  . Diabetes Mother   . Colon polyps Mother   . Hypertension Mother   . Hypertension Brother   . Hypertension Sister   . Colon cancer Neg Hx     Past Medical History:  Diagnosis Date  . Adenomatous colon polyp 01/2002  . Anemia   . CKD (chronic kidney disease), stage III   . Diverticulosis of colon   . DJD (degenerative joint disease)   . Hemorrhoids   . Hypertension   . Lumbar back pain   . Neuropathy   . Obesity   . PONV (postoperative nausea and vomiting)   . Renal cyst   . Shortness of breath dyspnea    with exertion  . Urinary tract infection   . Venous insufficiency   . Vitamin D deficiency     Past Surgical History:  Procedure Laterality Date  . ABDOMINAL HYSTERECTOMY    . COLONOSCOPY      Current Outpatient Prescriptions  Medication Sig Dispense Refill  . acetaminophen-codeine (TYLENOL #3) 300-30 MG tablet Take 1 tablet by mouth 2 (two) times daily.    Marland Kitchen amLODipine (NORVASC) 10 MG tablet Take 1 tablet (10 mg total) by mouth daily. 30 tablet 0  .  benzonatate (TESSALON) 100 MG capsule Take 1 capsule (100 mg total) by mouth 3 (three) times daily as needed for cough. 20 capsule 0  . calcitRIOL (ROCALTROL) 0.25 MCG capsule Take 0.5 mcg by mouth daily.  0  . cefpodoxime (VANTIN) 200 MG tablet Take 1 tablet (200 mg total) by mouth 2 (two) times daily. 20 tablet 0  . gabapentin (NEURONTIN) 300 MG capsule Take 1 capsule (300 mg total) by mouth 3 (three) times daily as needed (pain). 90 capsule 3  . hydrALAZINE (APRESOLINE) 50 MG tablet TAKE 1 TABLET BY MOUTH THREE TIMES DAILY 270 tablet 0  . hydrocortisone (ANUSOL-HC) 2.5 % rectal cream Place 1 application rectally as needed. 30 g 0  . ipratropium (ATROVENT) 0.03 % nasal spray Place 2 sprays into both  nostrils 2 (two) times daily. Do not use for more than 5days. 30 mL 0  . labetalol (NORMODYNE) 200 MG tablet TAKE 2 TABLETS BY MOUTH TWICE DAILY 120 tablet 0  . losartan (COZAAR) 100 MG tablet TAKE 1 TABLET BY MOUTH EVERY DAY 30 tablet 11  . ondansetron (ZOFRAN) 4 MG tablet Take 1 tablet (4 mg total) by mouth every 8 (eight) hours as needed for nausea or vomiting. 20 tablet 0  . PROCRIT 84536 UNIT/ML injection Inject 40,000 Units into the skin every 14 (fourteen) days.  3  . Propylene Glycol (SYSTANE BALANCE) 0.6 % SOLN Place 1 drop into both eyes daily as needed (for dry eyes).    . ranitidine (ZANTAC) 300 MG tablet Take 1 tablet (300 mg total) by mouth at bedtime. 30 tablet 1  . spironolactone (ALDACTONE) 25 MG tablet Take 1 tablet (25 mg total) by mouth daily. 30 tablet 6  . torsemide (DEMADEX) 20 MG tablet TAKE 2 TABLETS BY MOUTH EVERY MORNING 60 tablet 6  . traMADol (ULTRAM) 50 MG tablet Take 1 tablet (50 mg total) by mouth 3 (three) times daily as needed for severe pain. 15 tablet 0  . triamcinolone ointment (KENALOG) 0.5 % Apply 1 application topically 2 (two) times daily. 100 g 3  . predniSONE (DELTASONE) 20 MG tablet Take 1 tablet (20 mg total) by mouth daily with breakfast. 7 tablet 0   No  current facility-administered medications for this visit.     Allergies as of 12/23/2016 - Review Complete 12/23/2016  Allergen Reaction Noted  . Minoxidil Palpitations and Other (See Comments) 01/20/2012  . Naproxen Swelling 12/23/2006    Vitals: BP (!) 195/89 (BP Location: Right Arm, Patient Position: Sitting, Cuff Size: Large)   Pulse 86   Ht 5\' 6"  (1.676 m)   Wt 234 lb 3.2 oz (106.2 kg)   BMI 37.80 kg/m  Last Weight:  Wt Readings from Last 1 Encounters:  12/23/16 234 lb 3.2 oz (106.2 kg)   Last Height:   Ht Readings from Last 1 Encounters:  12/23/16 5\' 6"  (1.676 m)    MSK: Stiffness in the neck, anterocollis, tenderness on palpation of cervical muscles  Neuro: Detailed Neurologic Exam  Speech:    Speech is normal; fluent and spontaneous with normal comprehension.  Cognition:    The patient is oriented to person, place, and time;  Cranial Nerves:    The pupils are equal, round, and reactive to light. Visual fields are full to finger confrontation. Extraocular movements are intact. Trigeminal sensation is intact and the muscles of mastication are normal. The face is symmetric. The palate elevates in the midline. Hearing intact. Voice is normal. Shoulder shrug is normal. The tongue has normal motion without fasciculations.   Gait:    Wide based with small steps   Motor Observation:    No asymmetry, no atrophy, and no involuntary movements noted.  Posture:    Slightly stooped    Strength: Mild right arm weakness throughout out.       Sensation: intact to LT        Assessment/Plan:  Patient with complicated history with left-sided facial pain as well as left arm and left leg sensory changes.  MRI brain to evaluate for stroke or other intracranial etiologies was unremarkable. May be trigeminal irritation in the face, when she has the pain I advised her to take neurontin which she already takes at night for neuropathy. May also be cervical radiculopathy at c2-c3, CT  showed significant  degenerative disease.   Acute on chronic neck pain: Worsening neck pain, arm weakness, can incite radicular symptoms with movement of head/neck, decreased ROM need MRI cervical spine. 7 days of steroids.  The facial paresthesias may be due to higher level (c2-c3) cervical radiculopathy as well, CT neck was unrevealing need MRI cervical spine as much more sensitive.  Physical therapy for neck pain, cervical muscle spasms, musculoskeletal neck pain  Orders Placed This Encounter  Procedures  . MR CERVICAL SPINE WO CONTRAST  . Ambulatory referral to Physical Therapy    Sarina Ill, MD  [facility] [address] [place], [place] [address]  Phone [phone] Fax [phone]  A total of 30 minutes was spent face-to-face with this patient. Over half this time was spent on counseling patient on the neck pain, radiculopathydiagnosis and different diagnostic and therapeutic options available.

## 2016-12-23 NOTE — Patient Instructions (Signed)
Prednisone once daily for one week Physical therapy MRI cervical spine    Prednisone tablets What is this medicine? PREDNISONE (PRED ni sone) is a corticosteroid. It is commonly used to treat inflammation of the skin, joints, lungs, and other organs. Common conditions treated include asthma, allergies, and arthritis. It is also used for other conditions, such as blood disorders and diseases of the adrenal glands. This medicine may be used for other purposes; ask your health care provider or pharmacist if you have questions. COMMON BRAND NAME(S): Deltasone, Predone, Sterapred, Sterapred DS What should I tell my health care provider before I take this medicine? They need to know if you have any of these conditions: -Cushing's syndrome -diabetes -glaucoma -heart disease -high blood pressure -infection (especially a virus infection such as chickenpox, cold sores, or herpes) -kidney disease -liver disease -mental illness -myasthenia gravis -osteoporosis -seizures -stomach or intestine problems -thyroid disease -an unusual or allergic reaction to lactose, prednisone, other medicines, foods, dyes, or preservatives -pregnant or trying to get pregnant -breast-feeding How should I use this medicine? Take this medicine by mouth with a glass of water. Follow the directions on the prescription label. Take this medicine with food. If you are taking this medicine once a day, take it in the morning. Do not take more medicine than you are told to take. Do not suddenly stop taking your medicine because you may develop a severe reaction. Your doctor will tell you how much medicine to take. If your doctor wants you to stop the medicine, the dose may be slowly lowered over time to avoid any side effects. Talk to your pediatrician regarding the use of this medicine in children. Special care may be needed. Overdosage: If you think you have taken too much of this medicine contact a poison control center or  emergency room at once. NOTE: This medicine is only for you. Do not share this medicine with others. What if I miss a dose? If you miss a dose, take it as soon as you can. If it is almost time for your next dose, talk to your doctor or health care professional. You may need to miss a dose or take an extra dose. Do not take double or extra doses without advice. What may interact with this medicine? Do not take this medicine with any of the following medications: -metyrapone -mifepristone This medicine may also interact with the following medications: -aminoglutethimide -amphotericin B -aspirin and aspirin-like medicines -barbiturates -certain medicines for diabetes, like glipizide or glyburide -cholestyramine -cholinesterase inhibitors -cyclosporine -digoxin -diuretics -ephedrine -female hormones, like estrogens and birth control pills -isoniazid -ketoconazole -NSAIDS, medicines for pain and inflammation, like ibuprofen or naproxen -phenytoin -rifampin -toxoids -vaccines -warfarin This list may not describe all possible interactions. Give your health care provider a list of all the medicines, herbs, non-prescription drugs, or dietary supplements you use. Also tell them if you smoke, drink alcohol, or use illegal drugs. Some items may interact with your medicine. What should I watch for while using this medicine? Visit your doctor or health care professional for regular checks on your progress. If you are taking this medicine over a prolonged period, carry an identification card with your name and address, the type and dose of your medicine, and your doctor's name and address. This medicine may increase your risk of getting an infection. Tell your doctor or health care professional if you are around anyone with measles or chickenpox, or if you develop sores or blisters that do not heal properly. If  you are going to have surgery, tell your doctor or health care professional that you  have taken this medicine within the last twelve months. Ask your doctor or health care professional about your diet. You may need to lower the amount of salt you eat. This medicine may affect blood sugar levels. If you have diabetes, check with your doctor or health care professional before you change your diet or the dose of your diabetic medicine. What side effects may I notice from receiving this medicine? Side effects that you should report to your doctor or health care professional as soon as possible: -allergic reactions like skin rash, itching or hives, swelling of the face, lips, or tongue -changes in emotions or moods -changes in vision -depressed mood -eye pain -fever or chills, cough, sore throat, pain or difficulty passing urine -increased thirst -swelling of ankles, feet Side effects that usually do not require medical attention (report to your doctor or health care professional if they continue or are bothersome): -confusion, excitement, restlessness -headache -nausea, vomiting -skin problems, acne, thin and shiny skin -trouble sleeping -weight gain This list may not describe all possible side effects. Call your doctor for medical advice about side effects. You may report side effects to FDA at 1-800-FDA-1088. Where should I keep my medicine? Keep out of the reach of children. Store at room temperature between 15 and 30 degrees C (59 and 86 degrees F). Protect from light. Keep container tightly closed. Throw away any unused medicine after the expiration date. NOTE: This sheet is a summary. It may not cover all possible information. If you have questions about this medicine, talk to your doctor, pharmacist, or health care provider.  2018 Elsevier/Gold Standard (2010-11-08 10:57:14)

## 2016-12-25 NOTE — Telephone Encounter (Signed)
Leave message with result on pt voicemail.

## 2017-01-02 ENCOUNTER — Telehealth: Payer: Self-pay | Admitting: Neurology

## 2017-01-02 NOTE — Telephone Encounter (Signed)
Spoke to pt and she is having pain in her neck, spasms, that come and go.  He is scheduled for her MRI 01-13-17 and is asking if she can having anything prior to this.  She did have prednisone which helped when given.  Please advise.

## 2017-01-02 NOTE — Telephone Encounter (Signed)
Patient called and stated that she is having bad spasms in her neck and would like to know if there is something she can get to help. Please call and advise.

## 2017-01-02 NOTE — Telephone Encounter (Signed)
LMVM for pt to return call.   

## 2017-01-03 NOTE — Telephone Encounter (Signed)
[place], we can try Tizanidine or increase her gabapentin or give her some more steroids, ask what she prefers thanks

## 2017-01-03 NOTE — Telephone Encounter (Signed)
LMVM for pt that several option per Dr. Jaynee Eagles.  Will have to touch base on Monday.

## 2017-01-06 ENCOUNTER — Other Ambulatory Visit: Payer: Self-pay | Admitting: Neurology

## 2017-01-06 MED ORDER — TIZANIDINE HCL 4 MG PO TABS: 4.0000 mg | ORAL_TABLET | Freq: Four times a day (QID) | ORAL | 0 refills | Status: DC | PRN

## 2017-01-06 NOTE — Telephone Encounter (Signed)
LMVM for pt to return call (LM home #).

## 2017-01-06 NOTE — Telephone Encounter (Signed)
Pt called back and would like to try muscle relaxant.  Feels like has muscle tightness, and is taking tramadol for pain prn. Please order tizanidine.  Thanks.

## 2017-01-10 ENCOUNTER — Institutional Professional Consult (permissible substitution): Payer: Medicare Other | Admitting: Emergency Medicine

## 2017-01-13 ENCOUNTER — Ambulatory Visit
Admission: RE | Admit: 2017-01-13 | Discharge: 2017-01-13 | Disposition: A | Discharge status: Home / Self Care | Payer: Medicare Other | Source: Ambulatory Visit | Attending: Neurology | Admitting: Neurology

## 2017-01-13 DIAGNOSIS — R200 Anesthesia of skin: Secondary | ICD-10-CM | POA: Diagnosis not present

## 2017-01-13 DIAGNOSIS — R202 Paresthesia of skin: Secondary | ICD-10-CM

## 2017-01-13 DIAGNOSIS — R29898 Other symptoms and signs involving the musculoskeletal system: Secondary | ICD-10-CM | POA: Diagnosis not present

## 2017-01-13 DIAGNOSIS — M5382 Other specified dorsopathies, cervical region: Secondary | ICD-10-CM

## 2017-01-13 DIAGNOSIS — M542 Cervicalgia: Secondary | ICD-10-CM

## 2017-01-13 DIAGNOSIS — M4802 Spinal stenosis, cervical region: Secondary | ICD-10-CM | POA: Diagnosis not present

## 2017-01-13 DIAGNOSIS — M5412 Radiculopathy, cervical region: Secondary | ICD-10-CM

## 2017-01-14 ENCOUNTER — Telehealth: Payer: Self-pay | Admitting: Neurology

## 2017-01-14 ENCOUNTER — Other Ambulatory Visit: Payer: Self-pay | Admitting: *Deleted

## 2017-01-14 ENCOUNTER — Telehealth: Payer: Self-pay | Admitting: *Deleted

## 2017-01-14 ENCOUNTER — Other Ambulatory Visit: Payer: Self-pay | Admitting: Internal Medicine

## 2017-01-14 DIAGNOSIS — D619 Aplastic anemia, unspecified: Secondary | ICD-10-CM

## 2017-01-14 DIAGNOSIS — M542 Cervicalgia: Secondary | ICD-10-CM

## 2017-01-14 NOTE — Telephone Encounter (Signed)
Patient is aware of MRI results.  She is agreeable to both the referral to Dr. Maryjean Ka for an [facility] and hematology for further evaluation.  Her neck pain has been aggravated by the brace used w/ the MRI scan.  She has been using the Tramadol she has at home.  I also suggested she try her prescribed tizanidine and moist heat to the area.  I advised her not drive while taking these types of medications.  She verbalized understanding.

## 2017-01-14 NOTE — Telephone Encounter (Signed)
Patient had MRI of neck yesterday. Last night pains started shooting up back of her head and is still hurting and could not sleep.Marland Kitchen Please call to discuss.

## 2017-01-14 NOTE — Telephone Encounter (Signed)
Orders placed in Epic. Patient aware to expect a call for appointment scheduling.

## 2017-01-14 NOTE — Telephone Encounter (Addendum)
Patient had MRI of neck yesterday. Last night pains started shooting up back of her head and is still hurting and could not sleep.Marland Kitchen Please call to discuss.

## 2017-01-14 NOTE — Telephone Encounter (Signed)
Pt returned RN's call °

## 2017-01-14 NOTE — Telephone Encounter (Signed)
Left message requesting a return call.

## 2017-01-14 NOTE — Telephone Encounter (Signed)
-----   Message from Melvenia Beam, MD sent at 01/14/2017 10:52 AM EDT ----- Will refer for epidural steroid injections ro Dr. Maryjean Ka at neurosurgery. Also need referral tohematology.

## 2017-01-14 NOTE — Telephone Encounter (Signed)
Patient returning call and can be reached at [phone].

## 2017-01-21 ENCOUNTER — Ambulatory Visit: Payer: Medicare Other | Admitting: Nurse Practitioner

## 2017-01-22 ENCOUNTER — Other Ambulatory Visit (INDEPENDENT_AMBULATORY_CARE_PROVIDER_SITE_OTHER): Payer: Medicare Other

## 2017-01-22 ENCOUNTER — Ambulatory Visit (INDEPENDENT_AMBULATORY_CARE_PROVIDER_SITE_OTHER): Payer: Medicare Other | Admitting: Internal Medicine

## 2017-01-22 ENCOUNTER — Encounter: Payer: Self-pay | Admitting: Internal Medicine

## 2017-01-22 VITALS — BP 170/84 | HR 100 | Temp 98.2°F | Ht 66.0 in | Wt 227.0 lb

## 2017-01-22 DIAGNOSIS — R898 Other abnormal findings in specimens from other organs, systems and tissues: Secondary | ICD-10-CM | POA: Diagnosis not present

## 2017-01-22 DIAGNOSIS — Z23 Encounter for immunization: Secondary | ICD-10-CM

## 2017-01-22 DIAGNOSIS — E876 Hypokalemia: Secondary | ICD-10-CM

## 2017-01-22 DIAGNOSIS — M542 Cervicalgia: Secondary | ICD-10-CM

## 2017-01-22 LAB — RENAL FUNCTION PANEL
ALBUMIN: 3.3 g/dL — AB (ref 3.5–5.2)
BUN: 24 mg/dL — ABNORMAL HIGH (ref 6–23)
CALCIUM: 9.9 mg/dL (ref 8.4–10.5)
CO2: 28 mEq/L (ref 19–32)
Chloride: 106 mEq/L (ref 96–112)
Creatinine, Ser: 1.7 mg/dL — ABNORMAL HIGH (ref 0.40–1.20)
GFR: 38.07 mL/min — ABNORMAL LOW (ref >60.00)
GLUCOSE: 94 mg/dL (ref 70–99)
POTASSIUM: 4 meq/L (ref 3.5–5.1)
Phosphorus: 2.5 mg/dL (ref 2.3–4.6)
SODIUM: 143 meq/L (ref 135–145)

## 2017-01-22 LAB — MAGNESIUM: Magnesium: 1.7 mg/dL (ref 1.5–2.5)

## 2017-01-22 NOTE — Patient Instructions (Signed)
We are checking the labs today and will call you back about the results.    

## 2017-01-22 NOTE — Progress Notes (Signed)
   Subjective:    Patient ID: [name], female    DOB: [birth year], 71 y.o.   MRN: [id number]  HPI The patient is a 71 YO female coming in for ER follow up (in for low potassium and neck pain, recent MRI with neurology and referral for steroid injections in her neck, using tramadol and muscle relaxer with heat for the pain). She has followed up with neurology and got an MRI of the neck. This showed some abnormality with her bone marrow and she is going to see a hematologist. It is unclear if this could be related to her CKD stage 4 causing abnormal bone marrow or her procrit injections.   Review of Systems  Constitutional: Positive for activity change. Negative for appetite change, fatigue, fever and unexpected weight change.  HENT: Negative.   Eyes: Negative.   Respiratory: Negative for cough, chest tightness and shortness of breath.   Cardiovascular: Negative for chest pain, palpitations and leg swelling.  Gastrointestinal: Negative for abdominal distention, abdominal pain, constipation, diarrhea, nausea and vomiting.  Musculoskeletal: Positive for myalgias, neck pain and neck stiffness. Negative for arthralgias and back pain.  Skin: Negative.   Neurological: Negative for dizziness, seizures, syncope, facial asymmetry and speech difficulty.  Psychiatric/Behavioral: Negative.       Objective:   Physical Exam  Constitutional: She is oriented to person, place, and time. She appears well-developed and well-nourished.  HENT:  Head: Normocephalic and atraumatic.  Eyes: EOM are normal.  Neck: Neck supple.  ROM severely limited. Pain with palpation posteriorly and laterally  Cardiovascular: Normal rate and regular rhythm.   Pulmonary/Chest: Effort normal and breath sounds normal.  Abdominal: Soft.  Musculoskeletal: She exhibits no edema.  Neurological: She is alert and oriented to person, place, and time.  Skin: Skin is warm and dry.   Vitals:   01/22/17 0900 01/22/17 0942  BP: (!)  180/90 (!) 170/84  Pulse: 100   Temp: 98.2 F (36.8 C)   TempSrc: Oral   SpO2: 98%   Weight: 227 lb (103 kg)   Height: 5\' 6"  (1.676 m)      Assessment & Plan:  Flu shot given at visit

## 2017-01-23 ENCOUNTER — Other Ambulatory Visit: Payer: Self-pay | Admitting: Internal Medicine

## 2017-01-23 ENCOUNTER — Telehealth: Payer: Self-pay | Admitting: Neurology

## 2017-01-23 MED ORDER — MAGNESIUM GLUCONATE 30 MG PO TABS: 30.0000 mg | ORAL_TABLET | Freq: Two times a day (BID) | ORAL | 0 refills | Status: DC

## 2017-01-23 NOTE — Telephone Encounter (Signed)
Dr. Maryjean Ka Has Declined Referral he is suggesting [facility] sending referral there . I have called and left patient a message with update.

## 2017-01-24 DIAGNOSIS — E876 Hypokalemia: Secondary | ICD-10-CM | POA: Insufficient documentation

## 2017-01-24 DIAGNOSIS — R898 Other abnormal findings in specimens from other organs, systems and tissues: Secondary | ICD-10-CM | POA: Insufficient documentation

## 2017-01-24 NOTE — Assessment & Plan Note (Signed)
She is getting relief with muscle relaxer, heat, and tramadol. She will continue and work with PT. She is supposed to get an injection in her neck.

## 2017-01-24 NOTE — Assessment & Plan Note (Signed)
Rx for magnesium for 2 weeks to supplement and checking magnesium level. Checking potassium level today.

## 2017-01-24 NOTE — Assessment & Plan Note (Signed)
Referral made to hematology. It is unclear if the MRI findings are related to her CKD or procrit or if there is another dysplastic process. She was given counseling about the possibility of bone marrow biopsy.

## 2017-01-30 ENCOUNTER — Telehealth: Payer: Self-pay | Admitting: Hematology

## 2017-01-30 ENCOUNTER — Encounter: Payer: Self-pay | Admitting: Hematology

## 2017-01-30 NOTE — Telephone Encounter (Signed)
Appt has been scheduled for the pt to see Dr. Irene Limbo on 11/28 a 11am. I offered an earlier date and time but the pt declined. Letter mailed.

## 2017-02-01 ENCOUNTER — Emergency Department (HOSPITAL_COMMUNITY)
Admission: EM | Admit: 2017-02-01 | Discharge: 2017-02-01 | Disposition: A | Discharge status: Home / Self Care | Payer: Medicare Other | Source: Home / Self Care | Attending: Emergency Medicine | Admitting: Emergency Medicine

## 2017-02-01 ENCOUNTER — Encounter (HOSPITAL_COMMUNITY): Payer: Self-pay | Admitting: Emergency Medicine

## 2017-02-01 DIAGNOSIS — Z87891 Personal history of nicotine dependence: Secondary | ICD-10-CM | POA: Insufficient documentation

## 2017-02-01 DIAGNOSIS — M62838 Other muscle spasm: Secondary | ICD-10-CM | POA: Insufficient documentation

## 2017-02-01 DIAGNOSIS — I110 Hypertensive heart disease with heart failure: Secondary | ICD-10-CM | POA: Insufficient documentation

## 2017-02-01 DIAGNOSIS — Z79899 Other long term (current) drug therapy: Secondary | ICD-10-CM

## 2017-02-01 DIAGNOSIS — N183 Chronic kidney disease, stage 3 (moderate): Secondary | ICD-10-CM | POA: Insufficient documentation

## 2017-02-01 DIAGNOSIS — I5030 Unspecified diastolic (congestive) heart failure: Secondary | ICD-10-CM | POA: Insufficient documentation

## 2017-02-01 DIAGNOSIS — M542 Cervicalgia: Secondary | ICD-10-CM

## 2017-02-01 DIAGNOSIS — I129 Hypertensive chronic kidney disease with stage 1 through stage 4 chronic kidney disease, or unspecified chronic kidney disease: Secondary | ICD-10-CM

## 2017-02-01 MED ORDER — METHYLPREDNISOLONE 4 MG PO TBPK: ORAL_TABLET | ORAL | 0 refills | Status: DC

## 2017-02-01 MED ORDER — DIAZEPAM 2 MG PO TABS: 2.0000 mg | ORAL_TABLET | Freq: Two times a day (BID) | ORAL | 0 refills | Status: DC | PRN

## 2017-02-01 NOTE — Discharge Instructions (Signed)
It was my pleasure taking care of you today!   Take steroid taper as directed on package.  Valium twice daily as needed for muscle spasms - This can make you very drowsy - please do not drink alcohol, operate heavy machinery or drive on this medication.  Continue using heating pad.   Please follow up with your primary care doctor in 1 week for recheck.   Return to ER for fever, new or worsening symptoms, any additional concerns.

## 2017-02-01 NOTE — ED Triage Notes (Signed)
Per pt, states woke up with left sided neck stiffness which later turned into neck pain and cramping-states she has had symptoms in past-Neurologist ordered MRI which was negative-states prednisone and muscle relaxer's have helped in past-suppose to get referral to MD who will give her injections

## 2017-02-01 NOTE — ED Provider Notes (Signed)
[facility] DEPT Provider Note   CSN: [id number] Arrival date & time: 02/01/17  1101     History   Chief Complaint Chief Complaint  Patient presents with  . Neck Pain    HPI [name] is a 71 y.o. female.  The history is provided by the patient and medical records. No language interpreter was used.   [name] is a 71 y.o. female  with a PMH of DJD who presents to the Emergency Department complaining of acute onset of mid and left-sided upper neck pain which began last night, but worsened this morning upon awakening. Patient states hx of similar in the past which typically improves with muscle relaxants and heating pad, but she tried that this morning with no improvement. She has been seen by PCP for this who has performed MRI and referred her to hematologist for possible bone marrow biopsy of c-spine for diffuse hypointense bone marrow on scan. She also is followed by orthopedics who have done injections to her neck in the past. No numbness, tingling, weakness, fever, chills or headache. Pain is described as a squeezing, cramp which is worse when she moves her head to the left or right.   Past Medical History:  Diagnosis Date  . Adenomatous colon polyp 01/2002  . Anemia   . CKD (chronic kidney disease), stage III ([place])   . Diverticulosis of colon   . DJD (degenerative joint disease)   . Hemorrhoids   . Hypertension   . Lumbar back pain   . Neuropathy   . Obesity   . PONV (postoperative nausea and vomiting)   . Renal cyst   . Shortness of breath dyspnea    with exertion  . Urinary tract infection   . Venous insufficiency   . Vitamin D deficiency     Patient Active Problem List   Diagnosis Date Noted  . Hypokalemia 01/24/2017  . Abnormal bone marrow examination 01/24/2017  . Stenosis of right carotid artery 08/27/2016  . LBBB (left bundle branch block) 08/27/2016  . Left-sided tinnitus 08/16/2016  . Anemia in chronic kidney  disease 11/02/2015  . CKD (chronic kidney disease) stage 3, GFR 30-59 ml/min ([facility]) 04/26/2014  . Diastolic congestive heart failure ([place]) 04/26/2014  . Knee pain, bilateral 03/23/2012  . Murmur 02/24/2012  . VITAMIN D DEFICIENCY 04/13/2008  . OBESITY 02/23/2008  . Radiculopathy 02/23/2008  . Essential hypertension 02/23/2008  . Venous (peripheral) insufficiency 02/23/2008  . Neck pain 02/23/2008    Past Surgical History:  Procedure Laterality Date  . ABDOMINAL HYSTERECTOMY    . COLONOSCOPY      OB History    No data available       Home Medications    Prior to Admission medications   Medication Sig Start Date End Date Taking? Authorizing Provider  acetaminophen-codeine (TYLENOL #3) 300-30 MG tablet Take 1 tablet by mouth 2 (two) times daily. 07/15/16   [provider]  amLODipine (NORVASC) 10 MG tablet Take 1 tablet (10 mg total) by mouth daily. 05/01/14   Velvet Bathe, MD  benzonatate (TESSALON) 100 MG capsule Take 1 capsule (100 mg total) by mouth 3 (three) times daily as needed for cough. 11/27/16   Nche, Charlene Brooke, NP  calcitRIOL (ROCALTROL) 0.25 MCG capsule Take 0.5 mcg by mouth daily. 06/14/15   [provider]  diazepam (VALIUM) 2 MG tablet Take 1 tablet (2 mg total) by mouth every 12 (twelve) hours as needed for muscle spasms. 02/01/17   Ward,  Ozella Almond, PA-C  gabapentin (NEURONTIN) 300 MG capsule TAKE 1 CAPSULE(300 MG) BY MOUTH THREE TIMES DAILY AS NEEDED FOR PAIN 01/14/17   Hoyt Koch, MD  hydrALAZINE (APRESOLINE) 50 MG tablet TAKE 1 TABLET BY MOUTH THREE TIMES DAILY 08/06/16   Hoyt Koch, MD  hydrocortisone (ANUSOL-HC) 2.5 % rectal cream Place 1 application rectally as needed. 05/24/13   Noralee Space, MD  ipratropium (ATROVENT) 0.03 % nasal spray Place 2 sprays into both nostrils 2 (two) times daily. Do not use for more than 5days. 11/27/16   Nche, Charlene Brooke, NP  labetalol (NORMODYNE) 200 MG tablet TAKE 2 TABLETS BY MOUTH  TWICE DAILY    Noralee Space, MD  losartan (COZAAR) 100 MG tablet TAKE 1 TABLET BY MOUTH EVERY DAY 02/27/12   Noralee Space, MD  magnesium gluconate (MAGONATE) 30 MG tablet Take 1 tablet (30 mg total) by mouth 2 (two) times daily. 01/23/17   Hoyt Koch, MD  methylPREDNISolone (MEDROL DOSEPAK) 4 MG TBPK tablet Take as directed. 02/01/17   Ward, Ozella Almond, PA-C  ondansetron (ZOFRAN) 4 MG tablet Take 1 tablet (4 mg total) by mouth every 8 (eight) hours as needed for nausea or vomiting. 01/04/16   Hoyt Koch, MD  predniSONE (DELTASONE) 20 MG tablet Take 1 tablet (20 mg total) by mouth daily with breakfast. 12/23/16   Melvenia Beam, MD  PROCRIT 01027 UNIT/ML injection Inject 40,000 Units into the skin every 14 (fourteen) days. 06/26/16   [provider]  Propylene Glycol (SYSTANE BALANCE) 0.6 % SOLN Place 1 drop into both eyes daily as needed (for dry eyes).    [provider]  ranitidine (ZANTAC) 300 MG tablet Take 1 tablet (300 mg total) by mouth at bedtime. 11/27/16   Nche, Charlene Brooke, NP  spironolactone (ALDACTONE) 25 MG tablet Take 1 tablet (25 mg total) by mouth daily. 03/23/12   Noralee Space, MD  tiZANidine (ZANAFLEX) 4 MG tablet Take 1 tablet (4 mg total) by mouth every 6 (six) hours as needed for muscle spasms. 01/06/17   Melvenia Beam, MD  torsemide (DEMADEX) 20 MG tablet TAKE 2 TABLETS BY MOUTH EVERY MORNING 01/18/13   Noralee Space, MD  traMADol (ULTRAM) 50 MG tablet Take 1 tablet (50 mg total) by mouth 3 (three) times daily as needed for severe pain. 12/16/16   Duffy Bruce, MD  triamcinolone ointment (KENALOG) 0.5 % Apply 1 application topically 2 (two) times daily. 08/15/16   Hoyt Koch, MD    Family History Family History  Problem Relation Age of Onset  . Diabetes Mother   . Colon polyps Mother   . Hypertension Mother   . Hypertension Brother   . Hypertension Sister   . Colon cancer Neg Hx     Social  History Social History  Substance Use Topics  . Smoking status: Former Smoker    Packs/day: 0.40    Years: 15.00    Types: Cigarettes    Quit date: 04/08/1982  . Smokeless tobacco: Never Used  . Alcohol use No     Allergies   Minoxidil and Naproxen   Review of Systems Review of Systems  Constitutional: Negative for chills and fever.  Musculoskeletal: Positive for neck pain. Negative for back pain.  Neurological: Negative for numbness.  All other systems reviewed and are negative.    Physical Exam Updated Vital Signs BP (!) 196/91 (BP Location: Right Arm)   Pulse 85   Temp 97.9  F (36.6 C) (Oral)   Resp 17   SpO2 95%   Physical Exam  Constitutional: She is oriented to person, place, and time. She appears well-developed and well-nourished. No distress.  HENT:  Head: Normocephalic and atraumatic.  Neck:    TTP as depicted in image. Decreased ROM 2/2 pain.  Cardiovascular: Normal rate, regular rhythm and normal heart sounds.   No murmur heard. Pulmonary/Chest: Effort normal and breath sounds normal. No respiratory distress.  Neurological: She is alert and oriented to person, place, and time.  Bilateral upper extremities NVI.  Skin: Skin is warm and dry.  Nursing note and vitals reviewed.    ED Treatments / Results  Labs (all labs ordered are listed, but only abnormal results are displayed) Labs Reviewed - No data to display  EKG  EKG Interpretation None       Radiology No results found.  Procedures Procedures (including critical care time)  Medications Ordered in ED Medications - No data to display   Initial Impression / Assessment and Plan / ED Course  I have reviewed the triage vital signs and the nursing notes.  Pertinent labs & imaging results that were available during my care of the patient were reviewed by me and considered in my medical decision making (see chart for details).    [name] is a 71 y.o. female who presents to ED  for acute onset of neck pain c/w prior neck muscle strains x 1 days. On exam, bilateral upper extremities are NVI. Afebrile. Exam c/w muscle spasms.   MRI from 01/13/17 reviewed:   1.    The bone marrow is hypointense to muscle on all sequences.   This is nonspecific but could be due to bone marrow disorders/myelofibrosis such as myelodysplastic syndromes, leukemia or lymphoma, erythropoietic marrow or sclerotic metastases.  Consider CT scan or referral for bone marrow biopsy. 2.    There are multilevel degenerative changes as detailed above. The most significant findings are at C5-C6 where there is mild spinal stenosis and moderate right foraminal narrowing. There is no definite nerve root compression though the changes encroach upon the right C6 nerve root. Additionally, C3-C4 degenerative changes cause moderate right foraminal narrowing encroaching upon the right C4 nerve root. 3.    The spinal cord appears normal.  Per PCP appointment on 10/17, patient referred to hematology for further workup of MRI findings and counseled on the possibility of bone marrow biopsy.    Evaluation does not show pathology that would require ongoing emergent intervention or inpatient treatment. Will treat symptomatically and have patient continue with outpatient plan for PCP follow up and hematology referral. Reasons to return to ER discussed and all questions answered.   Patient discussed with Dr. Johnney Killian who agrees with treatment plan.    Final Clinical Impressions(s) / ED Diagnoses   Final diagnoses:  Neck pain  Muscle spasms of neck    New Prescriptions New Prescriptions   DIAZEPAM (VALIUM) 2 MG TABLET    Take 1 tablet (2 mg total) by mouth every 12 (twelve) hours as needed for muscle spasms.   METHYLPREDNISOLONE (MEDROL DOSEPAK) 4 MG TBPK TABLET    Take as directed.     Ward, Ozella Almond, PA-C 02/01/17 1245    Charlesetta Shanks, MD 02/01/17 1758

## 2017-02-04 ENCOUNTER — Emergency Department (HOSPITAL_COMMUNITY): Payer: Medicare Other

## 2017-02-04 ENCOUNTER — Inpatient Hospital Stay (HOSPITAL_COMMUNITY)
Admission: EM | Admit: 2017-02-04 | Discharge: 2017-02-12 | DRG: 391 | Disposition: A | Discharge status: Home Health | Payer: Medicare Other | Attending: Family Medicine | Admitting: Family Medicine

## 2017-02-04 ENCOUNTER — Encounter (HOSPITAL_COMMUNITY): Payer: Self-pay | Admitting: Emergency Medicine

## 2017-02-04 DIAGNOSIS — Z7951 Long term (current) use of inhaled steroids: Secondary | ICD-10-CM

## 2017-02-04 DIAGNOSIS — I872 Venous insufficiency (chronic) (peripheral): Secondary | ICD-10-CM | POA: Diagnosis not present

## 2017-02-04 DIAGNOSIS — E559 Vitamin D deficiency, unspecified: Secondary | ICD-10-CM | POA: Diagnosis present

## 2017-02-04 DIAGNOSIS — K2970 Gastritis, unspecified, without bleeding: Secondary | ICD-10-CM | POA: Diagnosis not present

## 2017-02-04 DIAGNOSIS — Z6835 Body mass index (BMI) 35.0-35.9, adult: Secondary | ICD-10-CM | POA: Diagnosis not present

## 2017-02-04 DIAGNOSIS — R1013 Epigastric pain: Secondary | ICD-10-CM

## 2017-02-04 DIAGNOSIS — I77811 Abdominal aortic ectasia: Secondary | ICD-10-CM | POA: Diagnosis not present

## 2017-02-04 DIAGNOSIS — Z7952 Long term (current) use of systemic steroids: Secondary | ICD-10-CM

## 2017-02-04 DIAGNOSIS — R001 Bradycardia, unspecified: Secondary | ICD-10-CM | POA: Diagnosis not present

## 2017-02-04 DIAGNOSIS — Z833 Family history of diabetes mellitus: Secondary | ICD-10-CM | POA: Diagnosis not present

## 2017-02-04 DIAGNOSIS — M5091 Cervical disc disorder, unspecified,  high cervical region: Secondary | ICD-10-CM | POA: Diagnosis present

## 2017-02-04 DIAGNOSIS — R112 Nausea with vomiting, unspecified: Secondary | ICD-10-CM | POA: Diagnosis not present

## 2017-02-04 DIAGNOSIS — K859 Acute pancreatitis without necrosis or infection, unspecified: Secondary | ICD-10-CM

## 2017-02-04 DIAGNOSIS — I130 Hypertensive heart and chronic kidney disease with heart failure and stage 1 through stage 4 chronic kidney disease, or unspecified chronic kidney disease: Secondary | ICD-10-CM | POA: Diagnosis present

## 2017-02-04 DIAGNOSIS — K219 Gastro-esophageal reflux disease without esophagitis: Secondary | ICD-10-CM | POA: Diagnosis not present

## 2017-02-04 DIAGNOSIS — Z888 Allergy status to other drugs, medicaments and biological substances status: Secondary | ICD-10-CM

## 2017-02-04 DIAGNOSIS — E876 Hypokalemia: Secondary | ICD-10-CM | POA: Diagnosis present

## 2017-02-04 DIAGNOSIS — N184 Chronic kidney disease, stage 4 (severe): Secondary | ICD-10-CM | POA: Diagnosis present

## 2017-02-04 DIAGNOSIS — R079 Chest pain, unspecified: Secondary | ICD-10-CM | POA: Diagnosis not present

## 2017-02-04 DIAGNOSIS — I5030 Unspecified diastolic (congestive) heart failure: Secondary | ICD-10-CM | POA: Diagnosis not present

## 2017-02-04 DIAGNOSIS — J9601 Acute respiratory failure with hypoxia: Secondary | ICD-10-CM

## 2017-02-04 DIAGNOSIS — I082 Rheumatic disorders of both aortic and tricuspid valves: Secondary | ICD-10-CM | POA: Diagnosis not present

## 2017-02-04 DIAGNOSIS — I313 Pericardial effusion (noninflammatory): Secondary | ICD-10-CM | POA: Diagnosis not present

## 2017-02-04 DIAGNOSIS — I5031 Acute diastolic (congestive) heart failure: Secondary | ICD-10-CM | POA: Diagnosis not present

## 2017-02-04 DIAGNOSIS — Z87891 Personal history of nicotine dependence: Secondary | ICD-10-CM

## 2017-02-04 DIAGNOSIS — R0602 Shortness of breath: Secondary | ICD-10-CM | POA: Diagnosis not present

## 2017-02-04 DIAGNOSIS — I447 Left bundle-branch block, unspecified: Secondary | ICD-10-CM | POA: Diagnosis not present

## 2017-02-04 DIAGNOSIS — N179 Acute kidney failure, unspecified: Secondary | ICD-10-CM | POA: Diagnosis not present

## 2017-02-04 DIAGNOSIS — I10 Essential (primary) hypertension: Secondary | ICD-10-CM | POA: Diagnosis present

## 2017-02-04 DIAGNOSIS — M4802 Spinal stenosis, cervical region: Secondary | ICD-10-CM | POA: Diagnosis present

## 2017-02-04 DIAGNOSIS — J988 Other specified respiratory disorders: Secondary | ICD-10-CM | POA: Diagnosis not present

## 2017-02-04 DIAGNOSIS — N189 Chronic kidney disease, unspecified: Secondary | ICD-10-CM

## 2017-02-04 DIAGNOSIS — I3139 Other pericardial effusion (noninflammatory): Secondary | ICD-10-CM

## 2017-02-04 DIAGNOSIS — D631 Anemia in chronic kidney disease: Secondary | ICD-10-CM | POA: Diagnosis present

## 2017-02-04 DIAGNOSIS — N183 Chronic kidney disease, stage 3 (moderate): Secondary | ICD-10-CM | POA: Diagnosis not present

## 2017-02-04 DIAGNOSIS — J181 Lobar pneumonia, unspecified organism: Secondary | ICD-10-CM | POA: Diagnosis not present

## 2017-02-04 DIAGNOSIS — Z8249 Family history of ischemic heart disease and other diseases of the circulatory system: Secondary | ICD-10-CM | POA: Diagnosis not present

## 2017-02-04 DIAGNOSIS — N281 Cyst of kidney, acquired: Secondary | ICD-10-CM | POA: Diagnosis not present

## 2017-02-04 DIAGNOSIS — I361 Nonrheumatic tricuspid (valve) insufficiency: Secondary | ICD-10-CM | POA: Diagnosis not present

## 2017-02-04 DIAGNOSIS — Z886 Allergy status to analgesic agent status: Secondary | ICD-10-CM

## 2017-02-04 DIAGNOSIS — M542 Cervicalgia: Secondary | ICD-10-CM

## 2017-02-04 DIAGNOSIS — I509 Heart failure, unspecified: Secondary | ICD-10-CM

## 2017-02-04 DIAGNOSIS — R509 Fever, unspecified: Secondary | ICD-10-CM | POA: Diagnosis not present

## 2017-02-04 DIAGNOSIS — I5032 Chronic diastolic (congestive) heart failure: Secondary | ICD-10-CM | POA: Diagnosis not present

## 2017-02-04 DIAGNOSIS — E669 Obesity, unspecified: Secondary | ICD-10-CM | POA: Diagnosis present

## 2017-02-04 HISTORY — DX: Anemia in other chronic diseases classified elsewhere: D63.8

## 2017-02-04 HISTORY — DX: Pericardial effusion (noninflammatory): I31.3

## 2017-02-04 HISTORY — DX: Other pericardial effusion (noninflammatory): I31.39

## 2017-02-04 HISTORY — DX: Unspecified osteoarthritis, unspecified site: M19.90

## 2017-02-04 HISTORY — DX: Acute pancreatitis without necrosis or infection, unspecified: K85.90

## 2017-02-04 HISTORY — DX: Heart failure, unspecified: I50.9

## 2017-02-04 HISTORY — DX: Urinary tract infection, site not specified: N39.0

## 2017-02-04 LAB — CBC WITH DIFFERENTIAL/PLATELET
BASOS ABS: 0 10*3/uL (ref 0.0–0.1)
Basophils Relative: 0 %
EOS ABS: 0 10*3/uL (ref 0.0–0.7)
EOS PCT: 0 %
HCT: 31.3 % — ABNORMAL LOW (ref 36.0–46.0)
HEMOGLOBIN: 9.8 g/dL — AB (ref 12.0–15.0)
LYMPHS ABS: 1.3 10*3/uL (ref 0.7–4.0)
LYMPHS PCT: 13 %
MCH: 28.8 pg (ref 26.0–34.0)
MCHC: 31.3 g/dL (ref 30.0–36.0)
MCV: 92.1 fL (ref 78.0–100.0)
Monocytes Absolute: 0.8 10*3/uL (ref 0.1–1.0)
Monocytes Relative: 8 %
NEUTROS PCT: 79 %
Neutro Abs: 8 10*3/uL — ABNORMAL HIGH (ref 1.7–7.7)
PLATELETS: 225 10*3/uL (ref 150–400)
RBC: 3.4 MIL/uL — AB (ref 3.87–5.11)
RDW: 17.2 % — ABNORMAL HIGH (ref 11.5–15.5)
WBC: 10.1 10*3/uL (ref 4.0–10.5)

## 2017-02-04 LAB — COMPREHENSIVE METABOLIC PANEL
ALT: 37 U/L (ref 14–54)
ANION GAP: 8 (ref 5–15)
AST: 36 U/L (ref 15–41)
Albumin: 3 g/dL — ABNORMAL LOW (ref 3.5–5.0)
Alkaline Phosphatase: 68 U/L (ref 38–126)
BUN: 28 mg/dL — ABNORMAL HIGH (ref 6–20)
CHLORIDE: 103 mmol/L (ref 101–111)
CO2: 29 mmol/L (ref 22–32)
CREATININE: 1.64 mg/dL — AB (ref 0.44–1.00)
Calcium: 9.5 mg/dL (ref 8.9–10.3)
GFR calc non Af Amer: 30 mL/min — ABNORMAL LOW (ref >60)
GFR, EST AFRICAN AMERICAN: 35 mL/min — AB (ref >60)
Glucose, Bld: 132 mg/dL — ABNORMAL HIGH (ref 65–99)
Potassium: 3.4 mmol/L — ABNORMAL LOW (ref 3.5–5.1)
SODIUM: 140 mmol/L (ref 135–145)
Total Bilirubin: 0.6 mg/dL (ref 0.3–1.2)
Total Protein: 6.6 g/dL (ref 6.5–8.1)

## 2017-02-04 LAB — HEMOGLOBIN A1C
Hgb A1c MFr Bld: 5.5 % (ref 4.8–5.6)
Mean Plasma Glucose: 111.15 mg/dL

## 2017-02-04 LAB — LIPID PANEL
CHOLESTEROL: 141 mg/dL (ref 0–200)
HDL: 49 mg/dL (ref >40)
LDL Cholesterol: 76 mg/dL (ref 0–99)
TRIGLYCERIDES: 78 mg/dL (ref <150)
Total CHOL/HDL Ratio: 2.9 RATIO
VLDL: 16 mg/dL (ref 0–40)

## 2017-02-04 LAB — I-STAT TROPONIN, ED: Troponin i, poc: 0.03 ng/mL (ref 0.00–0.08)

## 2017-02-04 LAB — LIPASE, BLOOD: LIPASE: 110 U/L — AB (ref 11–51)

## 2017-02-04 MED ORDER — CALCITRIOL 0.5 MCG PO CAPS
0.5000 ug | ORAL_CAPSULE | Freq: Every day | ORAL | Status: DC
  Administered 2017-02-04 – 2017-02-12 (×9): 0.5 ug via ORAL
  Filled 2017-02-04: qty 1
  Filled 2017-02-04 (×4): qty 2
  Filled 2017-02-04: qty 1
  Filled 2017-02-04: qty 2
  Filled 2017-02-04 (×2): qty 1
  Filled 2017-02-04: qty 2
  Filled 2017-02-04 (×4): qty 1
  Filled 2017-02-04 (×2): qty 2
  Filled 2017-02-04: qty 1

## 2017-02-04 MED ORDER — MAGNESIUM GLUCONATE 500 MG PO TABS
500.0000 mg | ORAL_TABLET | Freq: Two times a day (BID) | ORAL | Status: DC
  Administered 2017-02-04 – 2017-02-07 (×6): 500 mg via ORAL
  Filled 2017-02-04 (×7): qty 1

## 2017-02-04 MED ORDER — LABETALOL HCL 200 MG PO TABS
200.0000 mg | ORAL_TABLET | Freq: Once | ORAL | Status: AC
  Administered 2017-02-04: 200 mg via ORAL
  Filled 2017-02-04: qty 1

## 2017-02-04 MED ORDER — HYDRALAZINE HCL 50 MG PO TABS
50.0000 mg | ORAL_TABLET | Freq: Three times a day (TID) | ORAL | Status: DC
Start: 2017-02-04 — End: 2017-02-08
  Administered 2017-02-04 – 2017-02-08 (×11): 50 mg via ORAL
  Filled 2017-02-04 (×11): qty 1

## 2017-02-04 MED ORDER — ONDANSETRON HCL 4 MG/2ML IJ SOLN
4.0000 mg | Freq: Four times a day (QID) | INTRAMUSCULAR | Status: DC | PRN
Start: 2017-02-04 — End: 2017-02-12
  Administered 2017-02-04 – 2017-02-06 (×3): 4 mg via INTRAVENOUS
  Filled 2017-02-04 (×3): qty 2

## 2017-02-04 MED ORDER — SENNOSIDES-DOCUSATE SODIUM 8.6-50 MG PO TABS
1.0000 | ORAL_TABLET | Freq: Every evening | ORAL | Status: DC | PRN
  Administered 2017-02-09: 1 via ORAL
  Filled 2017-02-04: qty 1

## 2017-02-04 MED ORDER — HYDROCODONE-ACETAMINOPHEN 5-325 MG PO TABS
1.0000 | ORAL_TABLET | ORAL | Status: DC | PRN
  Administered 2017-02-05: 1 via ORAL
  Filled 2017-02-04: qty 1

## 2017-02-04 MED ORDER — METHYLPREDNISOLONE 4 MG PO TABS
12.0000 mg | ORAL_TABLET | Freq: Once | ORAL | Status: AC
  Administered 2017-02-04: 12 mg via ORAL
  Filled 2017-02-04: qty 3

## 2017-02-04 MED ORDER — LOSARTAN POTASSIUM 50 MG PO TABS
100.0000 mg | ORAL_TABLET | Freq: Every day | ORAL | Status: DC
  Administered 2017-02-05 – 2017-02-07 (×3): 100 mg via ORAL
  Filled 2017-02-04 (×3): qty 2

## 2017-02-04 MED ORDER — ACETAMINOPHEN 650 MG RE SUPP: 650.0000 mg | Freq: Four times a day (QID) | RECTAL | Status: DC | PRN

## 2017-02-04 MED ORDER — PANTOPRAZOLE SODIUM 40 MG IV SOLR
40.0000 mg | Freq: Every day | INTRAVENOUS | Status: DC
  Administered 2017-02-04 – 2017-02-05 (×2): 40 mg via INTRAVENOUS
  Filled 2017-02-04 (×2): qty 40

## 2017-02-04 MED ORDER — SODIUM CHLORIDE 0.9 % IV SOLN
INTRAVENOUS | Status: DC
Start: 2017-02-04 — End: 2017-02-05
  Administered 2017-02-04: 21:00:00 via INTRAVENOUS

## 2017-02-04 MED ORDER — SODIUM CHLORIDE 0.9 % IV BOLUS (SEPSIS)
500.0000 mL | Freq: Once | INTRAVENOUS | Status: AC
  Administered 2017-02-04: 500 mL via INTRAVENOUS

## 2017-02-04 MED ORDER — IPRATROPIUM BROMIDE 0.03 % NA SOLN: 2.0000 | Freq: Two times a day (BID) | NASAL | Status: DC | PRN

## 2017-02-04 MED ORDER — ONDANSETRON HCL 4 MG PO TABS: 4.0000 mg | ORAL_TABLET | Freq: Four times a day (QID) | ORAL | Status: DC | PRN

## 2017-02-04 MED ORDER — HYDRALAZINE HCL 20 MG/ML IJ SOLN
5.0000 mg | Freq: Three times a day (TID) | INTRAMUSCULAR | Status: DC | PRN
  Administered 2017-02-04: 10 mg via INTRAVENOUS
  Administered 2017-02-06 (×2): 5 mg via INTRAVENOUS
  Filled 2017-02-04 (×3): qty 1

## 2017-02-04 MED ORDER — GI COCKTAIL ~~LOC~~
30.0000 mL | Freq: Once | ORAL | Status: AC
  Administered 2017-02-04: 30 mL via ORAL
  Filled 2017-02-04: qty 30

## 2017-02-04 MED ORDER — METHYLPREDNISOLONE 4 MG PO TBPK: ORAL_TABLET | Freq: Every morning | ORAL | Status: DC

## 2017-02-04 MED ORDER — BISACODYL 10 MG RE SUPP: 10.0000 mg | Freq: Every day | RECTAL | Status: DC | PRN

## 2017-02-04 MED ORDER — MORPHINE SULFATE (PF) 4 MG/ML IV SOLN
1.0000 mg | INTRAVENOUS | Status: DC | PRN
  Administered 2017-02-04 – 2017-02-06 (×5): 1 mg via INTRAVENOUS
  Filled 2017-02-04 (×5): qty 1

## 2017-02-04 MED ORDER — ACETAMINOPHEN 325 MG PO TABS: 650.0000 mg | ORAL_TABLET | Freq: Four times a day (QID) | ORAL | Status: DC | PRN

## 2017-02-04 MED ORDER — MORPHINE SULFATE (PF) 4 MG/ML IV SOLN
4.0000 mg | Freq: Once | INTRAVENOUS | Status: AC
  Administered 2017-02-04: 4 mg via INTRAVENOUS
  Filled 2017-02-04: qty 1

## 2017-02-04 MED ORDER — HEPARIN SODIUM (PORCINE) 5000 UNIT/ML IJ SOLN
5000.0000 [IU] | Freq: Three times a day (TID) | INTRAMUSCULAR | Status: DC
  Administered 2017-02-04 – 2017-02-12 (×22): 5000 [IU] via SUBCUTANEOUS
  Filled 2017-02-04 (×21): qty 1

## 2017-02-04 MED ORDER — GABAPENTIN 300 MG PO CAPS
300.0000 mg | ORAL_CAPSULE | Freq: Three times a day (TID) | ORAL | Status: DC
  Administered 2017-02-04 – 2017-02-07 (×8): 300 mg via ORAL
  Filled 2017-02-04 (×8): qty 1

## 2017-02-04 MED ORDER — TORSEMIDE 20 MG PO TABS
40.0000 mg | ORAL_TABLET | Freq: Every morning | ORAL | Status: DC
  Administered 2017-02-05: 40 mg via ORAL
  Filled 2017-02-04: qty 2

## 2017-02-04 MED ORDER — DIAZEPAM 2 MG PO TABS: 2.0000 mg | ORAL_TABLET | Freq: Two times a day (BID) | ORAL | Status: DC | PRN

## 2017-02-04 MED ORDER — SPIRONOLACTONE 25 MG PO TABS
25.0000 mg | ORAL_TABLET | Freq: Every day | ORAL | Status: DC
  Administered 2017-02-04 – 2017-02-05 (×2): 25 mg via ORAL
  Filled 2017-02-04 (×2): qty 1

## 2017-02-04 MED ORDER — TIZANIDINE HCL 4 MG PO TABS: 4.0000 mg | ORAL_TABLET | Freq: Four times a day (QID) | ORAL | Status: DC | PRN

## 2017-02-04 MED ORDER — HYDRALAZINE HCL 25 MG PO TABS
50.0000 mg | ORAL_TABLET | Freq: Once | ORAL | Status: AC
  Administered 2017-02-04: 50 mg via ORAL
  Filled 2017-02-04: qty 2

## 2017-02-04 MED ORDER — METHYLPREDNISOLONE 4 MG PO TABS
4.0000 mg | ORAL_TABLET | Freq: Every day | ORAL | Status: AC
  Administered 2017-02-06: 4 mg via ORAL
  Filled 2017-02-04: qty 1

## 2017-02-04 MED ORDER — LOSARTAN POTASSIUM 50 MG PO TABS
100.0000 mg | ORAL_TABLET | Freq: Every day | ORAL | Status: DC
  Filled 2017-02-04: qty 2

## 2017-02-04 MED ORDER — METHYLPREDNISOLONE 4 MG PO TABS
4.0000 mg | ORAL_TABLET | Freq: Two times a day (BID) | ORAL | Status: AC
  Administered 2017-02-05 (×2): 4 mg via ORAL
  Filled 2017-02-04 (×2): qty 1

## 2017-02-04 MED ORDER — AMLODIPINE BESYLATE 10 MG PO TABS
10.0000 mg | ORAL_TABLET | Freq: Every day | ORAL | Status: DC
Start: 2017-02-05 — End: 2017-02-12
  Administered 2017-02-05 – 2017-02-12 (×8): 10 mg via ORAL
  Filled 2017-02-04 (×8): qty 1

## 2017-02-04 MED ORDER — PROMETHAZINE HCL 25 MG/ML IJ SOLN
12.5000 mg | Freq: Once | INTRAMUSCULAR | Status: AC
  Administered 2017-02-04: 12.5 mg via INTRAVENOUS
  Filled 2017-02-04: qty 1

## 2017-02-04 NOTE — H&P (Signed)
History and Physical    [name] UTM:[id number] DOB: 1945-05-26 DOA: 02/04/2017   PCP: Hoyt Koch, MD   Patient coming from:  Home    Chief Complaint:  HPI: [name] is a 71 y.o. female with medical history significant for HTN, HLD, history of moderate aortic regurgitation, history of pericardial effusion since 2018, left bundle branch block, CK B stage III, carotid artery disease, chronic bilateral lower extremity edema, brought via EMS, after developing acute onset of epigastric abdominal pain while getting dressed. The pain was constant, nonradiating, reporting that never had this type of pain before. The patient tried Tums, with no relief. She also reported some pain in the mid back, but denies any other areas of radiation. The patient had nausea, one episode of vomiting, but mostly dry heaves. She has not eaten anything today. She also has some shortness of breath, but that has resolved. The patient denies any alcohol abuse, or diabetes. She has still her gallbladder. She denies any history of gallstones. She denies any fever or chills. She denies any chest pain or palpitations. She denies any cough. She denies any pelvic pain, or urinary symptoms. No diarrhea. While reporting to EMS regarding her pain, somehow these was confused with cardiac complaints, for which she was given aspirin, and nitroglycerin 2, along with Zofran. No relief of her pain was achieved.  ED Course:  BP (!) 160/72   Pulse 68   Temp 98.5 F (36.9 C) (Oral)   Resp (!) 26   SpO2 96%    Lipase 110 hemoglobin 9.8 Glucose 132 creatinine 1.64 potassium 3.4  EKG  Sinus bradycardia at 59  with marked sinus arrhythmia Left bundle branch block T wave abnormality  Received Cozaar, Apresoline, Normodyne  Phenergan  Receiving Morphine for pain  CXR shows stable cardiomegaly and vascular congestion. EKG is sinus bradycardia with LBBB and T wave abnormality.   CT shows moderate pericardial effusion,  severely ectatic abdominal aorta, left renal cyst, inflammatory changes of lower abdomen. No acute changes were seen.   Review of Systems:  As per HPI otherwise all other systems reviewed and are negative  Past Medical History:  Diagnosis Date  . Adenomatous colon polyp 01/2002  . Anemia   . CHF (congestive heart failure) ([place])   . CKD (chronic kidney disease), stage III ([place])   . Diverticulosis of colon   . DJD (degenerative joint disease)   . Heart murmur   . Hemorrhoids   . Hypertension   . Lumbar back pain   . Neuropathy   . Obesity   . PONV (postoperative nausea and vomiting)   . Renal cyst   . Shortness of breath dyspnea    with exertion  . Urinary tract infection   . Venous insufficiency   . Vitamin D deficiency     Past Surgical History:  Procedure Laterality Date  . ABDOMINAL HYSTERECTOMY    . COLONOSCOPY      Social History Social History   Social History  . Marital status: Married    Spouse name: Juanda Crumble  . Number of children: 1  . Years of education: 79   Occupational History  . retired from [employer], [employer]    Social History Main Topics  . Smoking status: Former Smoker    Packs/day: 0.40    Years: 15.00    Types: Cigarettes    Quit date: 04/08/1982  . Smokeless tobacco: Never Used  . Alcohol use No  . Drug use: No  . Sexual  activity: Not on file   Other Topics Concern  . Not on file   Social History Narrative   Lives at home w/ her husband   Right-handed   Daily caffeine      Allergies  Allergen Reactions  . Minoxidil Palpitations and Other (See Comments)    unable to sleep  . Naproxen Swelling    Family History  Problem Relation Age of Onset  . Diabetes Mother   . Colon polyps Mother   . Hypertension Mother   . Hypertension Brother   . Hypertension Sister   . Colon cancer Neg Hx       Prior to Admission medications   Medication Sig Start Date End Date Taking? Authorizing Provider  acetaminophen-codeine (TYLENOL #3) 300-30  MG tablet Take 1 tablet by mouth every 6 (six) hours as needed for moderate pain.  07/15/16  Yes [provider]  amLODipine (NORVASC) 10 MG tablet Take 1 tablet (10 mg total) by mouth daily. 05/01/14  Yes Velvet Bathe, MD  calcitRIOL (ROCALTROL) 0.25 MCG capsule Take 0.5 mcg by mouth daily. 06/14/15  Yes [provider]  calcium carbonate (TUMS - DOSED IN MG ELEMENTAL CALCIUM) 500 MG chewable tablet Chew 2 tablets by mouth daily as needed for indigestion or heartburn.   Yes [provider]  diazepam (VALIUM) 2 MG tablet Take 1 tablet (2 mg total) by mouth every 12 (twelve) hours as needed for muscle spasms. 02/01/17  Yes Ward, Ozella Almond, PA-C  gabapentin (NEURONTIN) 300 MG capsule TAKE 1 CAPSULE(300 MG) BY MOUTH THREE TIMES DAILY AS NEEDED FOR PAIN 01/14/17  Yes Hoyt Koch, MD  hydrALAZINE (APRESOLINE) 50 MG tablet TAKE 1 TABLET BY MOUTH THREE TIMES DAILY Patient taking differently: TAKE 1 TABLET 50mg   BY MOUTH THREE TIMES DAILY 08/06/16  Yes Hoyt Koch, MD  hydrocortisone (ANUSOL-HC) 2.5 % rectal cream Place 1 application rectally as needed. 05/24/13  Yes Noralee Space, MD  ipratropium (ATROVENT) 0.03 % nasal spray Place 2 sprays into both nostrils 2 (two) times daily. Do not use for more than 5days. 11/27/16  Yes Nche, Charlene Brooke, NP  labetalol (NORMODYNE) 200 MG tablet TAKE 2 TABLETS BY MOUTH TWICE DAILY Patient taking differently: take two tablets 400mg  total  twice  daily   Yes Noralee Space, MD  losartan (COZAAR) 100 MG tablet TAKE 1 TABLET BY MOUTH EVERY DAY Patient taking differently: TAKE 1 TABLET  100mg  BY MOUTH EVERY DAY 02/27/12  Yes Noralee Space, MD  magnesium gluconate (MAGONATE) 500 MG tablet Take 1 tablet by mouth 2 (two) times daily. 01/23/17  Yes [provider]  methylPREDNISolone (MEDROL DOSEPAK) 4 MG TBPK tablet Take as directed. 02/01/17  Yes Ward, Ozella Almond, PA-C  ondansetron (ZOFRAN) 4 MG tablet Take 1 tablet (4  mg total) by mouth every 8 (eight) hours as needed for nausea or vomiting. 01/04/16  Yes Hoyt Koch, MD  predniSONE (DELTASONE) 20 MG tablet Take 1 tablet (20 mg total) by mouth daily with breakfast. 12/23/16  Yes Melvenia Beam, MD  PROCRIT 26948 UNIT/ML injection Inject 40,000 Units into the skin every 14 (fourteen) days. 06/26/16  Yes [provider]  Propylene Glycol (SYSTANE BALANCE) 0.6 % SOLN Place 1 drop into both eyes daily as needed (for dry eyes).   Yes [provider]  spironolactone (ALDACTONE) 25 MG tablet Take 1 tablet (25 mg total) by mouth daily. 03/23/12  Yes Noralee Space, MD  tiZANidine (ZANAFLEX) 4 MG  tablet Take 1 tablet (4 mg total) by mouth every 6 (six) hours as needed for muscle spasms. 01/06/17  Yes Melvenia Beam, MD  torsemide (DEMADEX) 20 MG tablet TAKE 2 TABLETS BY MOUTH EVERY MORNING Patient taking differently: TAKE 2 TABLETS 40mg  total BY MOUTH EVERY MORNING 01/18/13  Yes Noralee Space, MD  traMADol (ULTRAM) 50 MG tablet Take 1 tablet (50 mg total) by mouth 3 (three) times daily as needed for severe pain. 12/16/16  Yes Duffy Bruce, MD  triamcinolone ointment (KENALOG) 0.5 % Apply 1 application topically 2 (two) times daily. 08/15/16  Yes Hoyt Koch, MD  benzonatate (TESSALON) 100 MG capsule Take 1 capsule (100 mg total) by mouth 3 (three) times daily as needed for cough. Patient not taking: Reported on 02/04/2017 11/27/16   Nche, Charlene Brooke, NP  ranitidine (ZANTAC) 300 MG tablet Take 1 tablet (300 mg total) by mouth at bedtime. Patient not taking: Reported on 02/04/2017 11/27/16   Flossie Buffy, NP    Physical Exam:  Vitals:   02/04/17 1330 02/04/17 1400 02/04/17 1530 02/04/17 1545  BP: (!) 176/66 (!) 179/57 (!) 167/58 (!) 160/72  Pulse: 70 77 74 68  Resp: (!) 21 16 (!) 23 (!) 26  Temp:      TempSrc:      SpO2: 94% 93% 95% 96%   Constitutional: NAD, calm, uncomfortable due to nausea and abdominal pain    Eyes: PERRL, lids and conjunctivae normal ENMT: Mucous membranes are moist, without exudate or lesions  Neck: normal, supple, no masses, no thyromegaly Respiratory: clear to auscultation bilaterally, no wheezing, no crackles. Normal respiratory effort  Cardiovascular: Regular rate and rhythm,  murmur, rubs or gallops.2 + Lower extremity edema. 2+ pedal pulses. No carotid bruits.  Abdomen: Soft, morbidly obese Tender epigastrium and RUQ No hepatosplenomegaly. Bowel sounds positive.  Musculoskeletal: no clubbing / cyanosis. Moves all extremities Skin: no jaundice, No lesions.  Neurologic: Sensation intact  Strength equal in all extremities Psychiatric:   Alert and oriented x 3. Anxious    Labs on Admission: I have personally reviewed following labs and imaging studies  CBC:  Recent Labs Lab 02/04/17 1308  WBC 10.1  NEUTROABS 8.0*  HGB 9.8*  HCT 31.3*  MCV 92.1  PLT 562    Basic Metabolic Panel:  Recent Labs Lab 02/04/17 1308  NA 140  K 3.4*  CL 103  CO2 29  GLUCOSE 132*  BUN 28*  CREATININE 1.64*  CALCIUM 9.5    GFR: Estimated Creatinine Clearance: 38.1 mL/min (A) (by C-G formula based on SCr of 1.64 mg/dL (H)).  Liver Function Tests:  Recent Labs Lab 02/04/17 1308  AST 36  ALT 37  ALKPHOS 68  BILITOT 0.6  PROT 6.6  ALBUMIN 3.0*    Recent Labs Lab 02/04/17 1308  LIPASE 110*   No results for input(s): AMMONIA in the last 168 hours.  Coagulation Profile: No results for input(s): INR, PROTIME in the last 168 hours.  Cardiac Enzymes: No results for input(s): CKTOTAL, CKMB, CKMBINDEX, TROPONINI in the last 168 hours.  BNP (last 3 results)  Recent Labs  11/27/16 1036  PROBNP 202.0*    HbA1C: No results for input(s): HGBA1C in the last 72 hours.  CBG: No results for input(s): GLUCAP in the last 168 hours.  Lipid Profile: No results for input(s): CHOL, HDL, LDLCALC, TRIG, CHOLHDL, LDLDIRECT in the last 72 hours.  Thyroid Function  Tests: No results for input(s): TSH, T4TOTAL, FREET4, T3FREE, THYROIDAB in  the last 72 hours.  Anemia Panel: No results for input(s): VITAMINB12, FOLATE, FERRITIN, TIBC, IRON, RETICCTPCT in the last 72 hours.  Urine analysis:    Component Value Date/Time   COLORURINE YELLOW 12/16/2016 1124   APPEARANCEUR HAZY (A) 12/16/2016 1124   LABSPEC 1.015 12/16/2016 1124   PHURINE 5.0 12/16/2016 1124   GLUCOSEU NEGATIVE 12/16/2016 1124   GLUCOSEU NEGATIVE 12/08/2015 1024   HGBUR NEGATIVE 12/16/2016 1124   BILIRUBINUR NEGATIVE 12/16/2016 1124   BILIRUBINUR neg 01/04/2016 1054   KETONESUR NEGATIVE 12/16/2016 1124   PROTEINUR 100 (A) 12/16/2016 1124   UROBILINOGEN negative 01/04/2016 1054   UROBILINOGEN 0.2 12/08/2015 1024   NITRITE NEGATIVE 12/16/2016 1124   LEUKOCYTESUR MODERATE (A) 12/16/2016 1124    Sepsis Labs: @LABRCNTIP (procalcitonin:4,lacticidven:4) )No results found for this or any previous visit (from the past 240 hour(s)).   Radiological Exams on Admission: Ct Abdomen Pelvis Wo Contrast  Result Date: 02/04/2017 CLINICAL DATA:  Epigastric pain, nausea, constipation. EXAM: CT ABDOMEN AND PELVIS WITHOUT CONTRAST TECHNIQUE: Multidetector CT imaging of the abdomen and pelvis was performed following the standard protocol without IV contrast. COMPARISON:  None. FINDINGS: Lower chest: Moderate pericardial effusion with maximum transverse diameter of 15 mm. Enlarged heart. Calcific coronary artery disease. Bibasilar dependent atelectatic changes. Hepatobiliary: No focal liver abnormality is seen. No gallstones, gallbladder wall thickening, or biliary dilatation. Pancreas: Unremarkable. No pancreatic ductal dilatation or surrounding inflammatory changes. Spleen: Normal in size without focal abnormality. Adrenals/Urinary Tract: Adrenal glands are unremarkable. Kidneys are without renal calculi, focal lesion, or hydronephrosis. Large but simple appearing 6.2 cm left renal cyst. Bladder is  unremarkable. Stomach/Bowel: Stomach is within normal limits. Appendix appears normal. No evidence of bowel wall thickening, distention, or inflammatory changes. Scattered colonic diverticulosis. Vascular/Lymphatic: Aortic atherosclerosis. Severely ectatic aorta with maximum transverse diameter of the distal abdominal aorta of 3.2 cm. No enlarged abdominal or pelvic lymph nodes. Reproductive: Status post hysterectomy. No adnexal masses. Other: Postsurgical or inflammatory changes in the lower anterior abdominal wall. Fat containing periumbilical anterior abdominal wall hernia. Musculoskeletal: No acute osseous findings. L4-L5 prominent disc osteophyte complex. Mild lower lumbosacral spine posterior facet arthropathy. IMPRESSION: Moderate in size pericardial effusion. Enlarged heart. Coronary artery disease and calcific atherosclerotic disease of the aorta. Severely ectatic abdominal aorta with mild dilation of the distal abdominal aorta to 3.2 cm. Recommend followup by ultrasound in 3 years. This recommendation follows ACR consensus guidelines: White Paper of the ACR Incidental Findings Committee II on Vascular Findings. J Am Coll Radiol 2013; 16:109-604 Postsurgical or inflammatory changes in the lower anterior abdominal wall. Fat containing periumbilical anterior abdominal wall hernia. Simple appearing 6.2 cm left renal cyst. Electronically Signed   By: Fidela Salisbury M.D.   On: 02/04/2017 14:46   Dg Chest 2 View  Result Date: 02/04/2017 CLINICAL DATA:  Chest pain. EXAM: CHEST  2 VIEW COMPARISON:  Chest x-ray dated December 16, 2016. FINDINGS: Moderate cardiomegaly, unchanged. Mild pulmonary vascular congestion. No focal consolidation, pleural effusion, or pneumothorax. No acute osseous abnormality. IMPRESSION: Stable moderate cardiomegaly and pulmonary vascular congestion. Electronically Signed   By: Titus Dubin M.D.   On: 02/04/2017 12:52    EKG: Independently reviewed.   Assessment/Plan Active Problems:   Vitamin D deficiency   OBESITY   Essential hypertension   Venous (peripheral) insufficiency   CKD (chronic kidney disease) stage 3, GFR 30-59 ml/min (HCC)   Diastolic congestive heart failure (HCC)   Anemia in chronic kidney disease   LBBB (left bundle branch block)  Acute pancreatitis   Pericardial effusion     Acute Pancreatitis, unknown etiology : Lipase 110  Alkaline phosphatase 68 Last TG 93  No Leukocytosis CT abdomen showing mild pancreatitis, no gallstones, no cholecystitis  No history of previous pancreatic flare. Patient is not an ETOH drinker, or diabetic Still has gallbladder.  Inpatient Medsurg   NPO IVF  lipid panel and Hb A1C  Consider GI consult if not improving IV  Zofran IV Protonix   Advance diet as tolerated   Pericardial effusion, in a patient with Diastolic heart  failure and Pericardial effusion  known since last 2 D echo in 11/2016, along with grade 1 DD  CXR shows stable cardiomegaly and vascular congestion. EKG is sinus bradycardia with LBBB and T wave abnormality. CT A/P shows again this pericardial effusion, along with severely ecstatic aorta  No cardiac complaints. Tn neg . Weight 227 lbs  Repat 2 D echo EKG in am  If abnormal Echo or if she becomes symptomatic for pleural effusion , may need Cards versus CTCS involvement      Anemia of chronic disease Hemoglobin on admission 9.8  . Baseline Hb 10  Repeat CBC in am  No transfusion is indicated at this time Continue Procrit injections every 14 days, last dose 10/23   Chronic kidney disease stage   BL Cr 1.6, at 1.64 today  Lab Results  Component Value Date   CREATININE 1.64 (H) 02/04/2017   CREATININE 1.70 (H) 01/22/2017   CREATININE 1.67 (H) 12/16/2016  IVF Repeat CMET in am    Hypertension BP  160/72   Pulse 68   Took some of her meds at the ED due to elevated BP  Including Cozaar, Apresoline, Normodyne   Continue home anti-hypertensive medications some  his evening, rest in am  Hydralazine IV  Prn    DVT prophylaxis:  Heparin  Code Status:    Full  Family Communication:  Discussed with patient Disposition Plan: Expect patient to be discharged to home after condition improves Consults called:    None  Admission status:  Medsurg IP    Kennette Cuthrell E, PA-C [facility]   02/04/2017, 4:19 PM

## 2017-02-04 NOTE — ED Provider Notes (Signed)
[facility] EMERGENCY DEPARTMENT Provider Note   CSN: [id number] Arrival date & time: 02/04/17  1104     History   Chief Complaint Chief Complaint  Patient presents with  . Chest Pain    HPI [name] is a 71 y.o. female who presents with abdominal pain, N/V. PMH significant for HTN, moderate aortic regurgitation, LBBB, CKD stage 3, carotid artery disease, chronic lower leg edema. This morning she had an acute onset of epigastric abdominal pain while getting dressed. It is constant and non-radiating. It feels like a "gas pain". She has not had this pain before. She tried Tums with no relief. She also reports some pain in her mid-back but denies radiation of pain. She reports associated sweats, nausea and dry heaves. She has not eaten anything today. She also has had some SOB but this has resolved. She denies alcohol use. She denies fever, chills, chest pain, cough, lower abdominal pain, diarrhea, urinary symptoms. She was given 324mg  ASA, nitro x 2, and zofran by EMS. Of note, the patient was seen in the ED on 9/10 and her diuretic was discontinued due to low potassium (was 3.1). Since then she has had increased swelling in her lower legs. She has seen Dr. Percival Spanish in the past and was evaluated for CHF. He did not feel like she did have CHF since her diuretic was not helping her SOB and she was referred to pulmonology which she has not seen yet. Echo on 12/05/16 showed severe LVH with a preserved EF and grade 1 diastolic dysfunction. Past surgical hx of partial hysterectomy.  Nephrologist is Dr. Jimmy Footman Cardiologist is Dr. Percival Spanish PCP is Dr. Sharlet Salina  HPI  Past Medical History:  Diagnosis Date  . Adenomatous colon polyp 01/2002  . Anemia   . CKD (chronic kidney disease), stage III ([place])   . Diverticulosis of colon   . DJD (degenerative joint disease)   . Hemorrhoids   . Hypertension   . Lumbar back pain   . Neuropathy   . Obesity   . PONV (postoperative  nausea and vomiting)   . Renal cyst   . Shortness of breath dyspnea    with exertion  . Urinary tract infection   . Venous insufficiency   . Vitamin D deficiency     Patient Active Problem List   Diagnosis Date Noted  . Hypokalemia 01/24/2017  . Abnormal bone marrow examination 01/24/2017  . Stenosis of right carotid artery 08/27/2016  . LBBB (left bundle branch block) 08/27/2016  . Left-sided tinnitus 08/16/2016  . Anemia in chronic kidney disease 11/02/2015  . CKD (chronic kidney disease) stage 3, GFR 30-59 ml/min ([facility]) 04/26/2014  . Diastolic congestive heart failure ([place]) 04/26/2014  . Knee pain, bilateral 03/23/2012  . Murmur 02/24/2012  . VITAMIN D DEFICIENCY 04/13/2008  . OBESITY 02/23/2008  . Radiculopathy 02/23/2008  . Essential hypertension 02/23/2008  . Venous (peripheral) insufficiency 02/23/2008  . Neck pain 02/23/2008    Past Surgical History:  Procedure Laterality Date  . ABDOMINAL HYSTERECTOMY    . COLONOSCOPY      OB History    No data available       Home Medications    Prior to Admission medications   Medication Sig Start Date End Date Taking? Authorizing Provider  acetaminophen-codeine (TYLENOL #3) 300-30 MG tablet Take 1 tablet by mouth 2 (two) times daily. 07/15/16   [provider]  amLODipine (NORVASC) 10 MG tablet Take 1 tablet (10 mg total) by mouth daily.  05/01/14   Velvet Bathe, MD  benzonatate (TESSALON) 100 MG capsule Take 1 capsule (100 mg total) by mouth 3 (three) times daily as needed for cough. 11/27/16   Nche, Charlene Brooke, NP  calcitRIOL (ROCALTROL) 0.25 MCG capsule Take 0.5 mcg by mouth daily. 06/14/15   [provider]  diazepam (VALIUM) 2 MG tablet Take 1 tablet (2 mg total) by mouth every 12 (twelve) hours as needed for muscle spasms. 02/01/17   Ward, Ozella Almond, PA-C  gabapentin (NEURONTIN) 300 MG capsule TAKE 1 CAPSULE(300 MG) BY MOUTH THREE TIMES DAILY AS NEEDED FOR PAIN 01/14/17   Hoyt Koch, MD    hydrALAZINE (APRESOLINE) 50 MG tablet TAKE 1 TABLET BY MOUTH THREE TIMES DAILY 08/06/16   Hoyt Koch, MD  hydrocortisone (ANUSOL-HC) 2.5 % rectal cream Place 1 application rectally as needed. 05/24/13   Noralee Space, MD  ipratropium (ATROVENT) 0.03 % nasal spray Place 2 sprays into both nostrils 2 (two) times daily. Do not use for more than 5days. 11/27/16   Nche, Charlene Brooke, NP  labetalol (NORMODYNE) 200 MG tablet TAKE 2 TABLETS BY MOUTH TWICE DAILY    Noralee Space, MD  losartan (COZAAR) 100 MG tablet TAKE 1 TABLET BY MOUTH EVERY DAY 02/27/12   Noralee Space, MD  magnesium gluconate (MAGONATE) 30 MG tablet Take 1 tablet (30 mg total) by mouth 2 (two) times daily. 01/23/17   Hoyt Koch, MD  methylPREDNISolone (MEDROL DOSEPAK) 4 MG TBPK tablet Take as directed. 02/01/17   Ward, Ozella Almond, PA-C  ondansetron (ZOFRAN) 4 MG tablet Take 1 tablet (4 mg total) by mouth every 8 (eight) hours as needed for nausea or vomiting. 01/04/16   Hoyt Koch, MD  predniSONE (DELTASONE) 20 MG tablet Take 1 tablet (20 mg total) by mouth daily with breakfast. 12/23/16   Melvenia Beam, MD  PROCRIT 27062 UNIT/ML injection Inject 40,000 Units into the skin every 14 (fourteen) days. 06/26/16   [provider]  Propylene Glycol (SYSTANE BALANCE) 0.6 % SOLN Place 1 drop into both eyes daily as needed (for dry eyes).    [provider]  ranitidine (ZANTAC) 300 MG tablet Take 1 tablet (300 mg total) by mouth at bedtime. 11/27/16   Nche, Charlene Brooke, NP  spironolactone (ALDACTONE) 25 MG tablet Take 1 tablet (25 mg total) by mouth daily. 03/23/12   Noralee Space, MD  tiZANidine (ZANAFLEX) 4 MG tablet Take 1 tablet (4 mg total) by mouth every 6 (six) hours as needed for muscle spasms. 01/06/17   Melvenia Beam, MD  torsemide (DEMADEX) 20 MG tablet TAKE 2 TABLETS BY MOUTH EVERY MORNING 01/18/13   Noralee Space, MD  traMADol (ULTRAM) 50 MG tablet Take 1 tablet (50 mg total)  by mouth 3 (three) times daily as needed for severe pain. 12/16/16   Duffy Bruce, MD  triamcinolone ointment (KENALOG) 0.5 % Apply 1 application topically 2 (two) times daily. 08/15/16   Hoyt Koch, MD    Family History Family History  Problem Relation Age of Onset  . Diabetes Mother   . Colon polyps Mother   . Hypertension Mother   . Hypertension Brother   . Hypertension Sister   . Colon cancer Neg Hx     Social History Social History  Substance Use Topics  . Smoking status: Former Smoker    Packs/day: 0.40    Years: 15.00    Types: Cigarettes    Quit date: 04/08/1982  .  Smokeless tobacco: Never Used  . Alcohol use No     Allergies   Minoxidil and Naproxen   Review of Systems Review of Systems  Constitutional: Positive for diaphoresis. Negative for chills and fever.  Respiratory: Positive for shortness of breath. Negative for cough and wheezing.   Cardiovascular: Positive for leg swelling. Negative for chest pain.  Gastrointestinal: Positive for abdominal pain, nausea and vomiting. Negative for diarrhea.  Genitourinary: Negative for dysuria.  Musculoskeletal: Positive for back pain.  Neurological: Negative for syncope and light-headedness.  All other systems reviewed and are negative.   Physical Exam Updated Vital Signs BP (!) 188/89   Pulse (!) 52   Temp 98.5 F (36.9 C) (Oral)   Resp 19   SpO2 99%   Physical Exam  Constitutional: She is oriented to person, place, and time. She appears well-developed and well-nourished. No distress.  Obese elderly female. Appears uncomfortable. Retching at times  HENT:  Head: Normocephalic and atraumatic.  Eyes: Conjunctivae are normal. Right eye exhibits no discharge. Left eye exhibits no discharge. No scleral icterus.  Neck: Normal range of motion.  Cardiovascular: Normal rate, regular rhythm and intact distal pulses.  Exam reveals no gallop and no friction rub.   No murmur heard. Pulmonary/Chest: Effort  normal and breath sounds normal. No respiratory distress. She has no wheezes. She has no rales. She exhibits no tenderness.  Abdominal: Soft. Bowel sounds are normal. She exhibits no distension and no mass. There is tenderness (epigastric and RUQ pain). There is no rebound and no guarding. No hernia.  Neurological: She is alert and oriented to person, place, and time.  Skin: Skin is warm and dry.  Psychiatric: She has a normal mood and affect. Her behavior is normal.  Nursing note and vitals reviewed.    ED Treatments / Results  Labs (all labs ordered are listed, but only abnormal results are displayed) Labs Reviewed  COMPREHENSIVE METABOLIC PANEL - Abnormal; Notable for the following:       Result Value   Potassium 3.4 (*)    Glucose, Bld 132 (*)    BUN 28 (*)    Creatinine, Ser 1.64 (*)    Albumin 3.0 (*)    GFR calc non Af Amer 30 (*)    GFR calc Af Amer 35 (*)    All other components within normal limits  LIPASE, BLOOD - Abnormal; Notable for the following:    Lipase 110 (*)    All other components within normal limits  CBC WITH DIFFERENTIAL/PLATELET - Abnormal; Notable for the following:    RBC 3.40 (*)    Hemoglobin 9.8 (*)    HCT 31.3 (*)    RDW 17.2 (*)    Neutro Abs 8.0 (*)    All other components within normal limits  I-STAT TROPONIN, ED    EKG  EKG Interpretation  Date/Time:  Tuesday February 04 2017 11:10:44 EDT Ventricular Rate:  50 PR Interval:  168 QRS Duration: 146 QT Interval:  520 QTC Calculation: 474 R Axis:   -59 Text Interpretation:  Sinus bradycardia with marked sinus arrhythmia Left bundle branch block T wave abnormality Abnormal ekg Confirmed by Carmin Muskrat [phone]) on 02/04/2017 11:16:23 AM       Radiology Ct Abdomen Pelvis Wo Contrast  Result Date: 02/04/2017 CLINICAL DATA:  Epigastric pain, nausea, constipation. EXAM: CT ABDOMEN AND PELVIS WITHOUT CONTRAST TECHNIQUE: Multidetector CT imaging of the abdomen and pelvis was performed  following the standard protocol without IV contrast. COMPARISON:  None.  FINDINGS: Lower chest: Moderate pericardial effusion with maximum transverse diameter of 15 mm. Enlarged heart. Calcific coronary artery disease. Bibasilar dependent atelectatic changes. Hepatobiliary: No focal liver abnormality is seen. No gallstones, gallbladder wall thickening, or biliary dilatation. Pancreas: Unremarkable. No pancreatic ductal dilatation or surrounding inflammatory changes. Spleen: Normal in size without focal abnormality. Adrenals/Urinary Tract: Adrenal glands are unremarkable. Kidneys are without renal calculi, focal lesion, or hydronephrosis. Large but simple appearing 6.2 cm left renal cyst. Bladder is unremarkable. Stomach/Bowel: Stomach is within normal limits. Appendix appears normal. No evidence of bowel wall thickening, distention, or inflammatory changes. Scattered colonic diverticulosis. Vascular/Lymphatic: Aortic atherosclerosis. Severely ectatic aorta with maximum transverse diameter of the distal abdominal aorta of 3.2 cm. No enlarged abdominal or pelvic lymph nodes. Reproductive: Status post hysterectomy. No adnexal masses. Other: Postsurgical or inflammatory changes in the lower anterior abdominal wall. Fat containing periumbilical anterior abdominal wall hernia. Musculoskeletal: No acute osseous findings. L4-L5 prominent disc osteophyte complex. Mild lower lumbosacral spine posterior facet arthropathy. IMPRESSION: Moderate in size pericardial effusion. Enlarged heart. Coronary artery disease and calcific atherosclerotic disease of the aorta. Severely ectatic abdominal aorta with mild dilation of the distal abdominal aorta to 3.2 cm. Recommend followup by ultrasound in 3 years. This recommendation follows ACR consensus guidelines: White Paper of the ACR Incidental Findings Committee II on Vascular Findings. J Am Coll Radiol 2013; 15:400-867 Postsurgical or inflammatory changes in the lower anterior abdominal  wall. Fat containing periumbilical anterior abdominal wall hernia. Simple appearing 6.2 cm left renal cyst. Electronically Signed   By: Fidela Salisbury M.D.   On: 02/04/2017 14:46   Dg Chest 2 View  Result Date: 02/04/2017 CLINICAL DATA:  Chest pain. EXAM: CHEST  2 VIEW COMPARISON:  Chest x-ray dated December 16, 2016. FINDINGS: Moderate cardiomegaly, unchanged. Mild pulmonary vascular congestion. No focal consolidation, pleural effusion, or pneumothorax. No acute osseous abnormality. IMPRESSION: Stable moderate cardiomegaly and pulmonary vascular congestion. Electronically Signed   By: Titus Dubin M.D.   On: 02/04/2017 12:52    Procedures Procedures (including critical care time)  Medications Ordered in ED Medications  losartan (COZAAR) tablet 100 mg (not administered)  morphine 4 MG/ML injection 4 mg (not administered)  promethazine (PHENERGAN) injection 12.5 mg (12.5 mg Intravenous Given 02/04/17 1207)  gi cocktail (Maalox,Lidocaine,Donnatal) (30 mLs Oral Given 02/04/17 1321)  hydrALAZINE (APRESOLINE) tablet 50 mg (50 mg Oral Given 02/04/17 1403)  labetalol (NORMODYNE) tablet 200 mg (200 mg Oral Given 02/04/17 1403)     Initial Impression / Assessment and Plan / ED Course  I have reviewed the triage vital signs and the nursing notes.  Pertinent labs & imaging results that were available during my care of the patient were reviewed by me and considered in my medical decision making (see chart for details).  71 year old female presents with abdominal pain, N/V. She is markedly hypertensive but otherwise vitals are normal. SBP by EMS was 270s which has improved to 180-190 in the ED. She was given her home BP meds which has kept her BP around 170-190. On review of EMR it appears her BP is relatively uncontrolled although she tells me at home that her BP runs around 140. CBC is remarkable for anemia which is around her baseline. CMP is remarkable for mild hypokalemia (3.4), elevated  SCr which is about her baseline. Lipase is elevated to 110. It is possible she has early pancreatitis although she denies alcohol use and LFTs are normal.  CXR shows stable cardiomegaly and vascular congestion. EKG  is sinus bradycardia with LBBB and T wave abnormality. She has tolerated PO with no worsening of pain. Vomiting was controlled with Phenergan. GI cocktail was given without relief. Morphine is ordered.   CT shows moderate pericardial effusion, severely ectatic abdominal aorta, left renal cyst, inflammatory changes of lower abdomen. No acute changes were seen. Morphine is still ordered due to nursing delay. 500cc bolus was ordered and patient was made NPO. Spoke to [employer] PA-C who will come to see patient.  Final Clinical Impressions(s) / ED Diagnoses   Final diagnoses:  Epigastric pain  Non-intractable vomiting with nausea, unspecified vomiting type    New Prescriptions New Prescriptions   No medications on file     Iris Pert 02/05/17 [id number]    Carmin Muskrat, MD 02/13/17 [phone]

## 2017-02-04 NOTE — ED Triage Notes (Addendum)
Patient arrived to ED via EMS from home. EMS reports:  Patient c/o central chest pain/epigastric area. Began approx 1000. 10/10 on pain scale. Burning, pressure. No readiation. Nausea. No vomiting.  Very hypertensive 270/130. Received NTG tabs x 2, ASA 325 mg, Zofran 4. BP decreased to 194/89.  18 Gauge in L AC.

## 2017-02-04 NOTE — Progress Notes (Signed)
Attempted to get report x 1. Number left with RN.

## 2017-02-04 NOTE — ED Notes (Signed)
Patient transported to CT 

## 2017-02-04 NOTE — ED Notes (Signed)
Claiborne Billings, PA at bedside at this time.

## 2017-02-04 NOTE — ED Notes (Signed)
Attempted to call report x 1 to 5 [place].

## 2017-02-04 NOTE — Progress Notes (Signed)
Received report on pt.

## 2017-02-05 ENCOUNTER — Inpatient Hospital Stay (HOSPITAL_COMMUNITY): Payer: Medicare Other

## 2017-02-05 DIAGNOSIS — I361 Nonrheumatic tricuspid (valve) insufficiency: Secondary | ICD-10-CM

## 2017-02-05 LAB — COMPREHENSIVE METABOLIC PANEL
ALBUMIN: 2.8 g/dL — AB (ref 3.5–5.0)
ALK PHOS: 64 U/L (ref 38–126)
ALT: 37 U/L (ref 14–54)
ANION GAP: 10 (ref 5–15)
AST: 36 U/L (ref 15–41)
BILIRUBIN TOTAL: 0.6 mg/dL (ref 0.3–1.2)
BUN: 30 mg/dL — ABNORMAL HIGH (ref 6–20)
CO2: 26 mmol/L (ref 22–32)
Calcium: 9.3 mg/dL (ref 8.9–10.3)
Chloride: 104 mmol/L (ref 101–111)
Creatinine, Ser: 1.57 mg/dL — ABNORMAL HIGH (ref 0.44–1.00)
GFR calc Af Amer: 37 mL/min — ABNORMAL LOW (ref >60)
GFR calc non Af Amer: 32 mL/min — ABNORMAL LOW (ref >60)
GLUCOSE: 118 mg/dL — AB (ref 65–99)
POTASSIUM: 4.1 mmol/L (ref 3.5–5.1)
Sodium: 140 mmol/L (ref 135–145)
Total Protein: 6.4 g/dL — ABNORMAL LOW (ref 6.5–8.1)

## 2017-02-05 LAB — CBC
HEMATOCRIT: 30.3 % — AB (ref 36.0–46.0)
HEMOGLOBIN: 9.4 g/dL — AB (ref 12.0–15.0)
MCH: 28.7 pg (ref 26.0–34.0)
MCHC: 31 g/dL (ref 30.0–36.0)
MCV: 92.7 fL (ref 78.0–100.0)
Platelets: 178 10*3/uL (ref 150–400)
RBC: 3.27 MIL/uL — AB (ref 3.87–5.11)
RDW: 18 % — ABNORMAL HIGH (ref 11.5–15.5)
WBC: 9.2 10*3/uL (ref 4.0–10.5)

## 2017-02-05 LAB — URINALYSIS, ROUTINE W REFLEX MICROSCOPIC
Bilirubin Urine: NEGATIVE
GLUCOSE, UA: NEGATIVE mg/dL
HGB URINE DIPSTICK: NEGATIVE
KETONES UR: NEGATIVE mg/dL
LEUKOCYTES UA: NEGATIVE
Nitrite: NEGATIVE
PROTEIN: NEGATIVE mg/dL
Specific Gravity, Urine: 1.006 (ref 1.005–1.030)
pH: 5 (ref 5.0–8.0)

## 2017-02-05 LAB — PROTIME-INR
INR: 1.16
PROTHROMBIN TIME: 14.7 s (ref 11.4–15.2)

## 2017-02-05 LAB — ECHOCARDIOGRAM LIMITED
HEIGHTINCHES: 66 in
Weight: 3574.4 oz

## 2017-02-05 MED ORDER — PROMETHAZINE HCL 25 MG/ML IJ SOLN
12.5000 mg | Freq: Once | INTRAMUSCULAR | Status: AC
  Administered 2017-02-05: 12.5 mg via INTRAVENOUS
  Filled 2017-02-05: qty 1

## 2017-02-05 MED ORDER — CLONIDINE HCL 0.1 MG PO TABS
0.1000 mg | ORAL_TABLET | Freq: Once | ORAL | Status: AC | PRN
  Administered 2017-02-05: 0.1 mg via ORAL
  Filled 2017-02-05: qty 1

## 2017-02-05 MED ORDER — HYDRALAZINE HCL 20 MG/ML IJ SOLN
20.0000 mg | Freq: Once | INTRAMUSCULAR | Status: AC
  Administered 2017-02-05: 20 mg via INTRAVENOUS
  Filled 2017-02-05: qty 1

## 2017-02-05 MED ORDER — GI COCKTAIL ~~LOC~~
30.0000 mL | Freq: Once | ORAL | Status: AC
  Administered 2017-02-05: 30 mL via ORAL
  Filled 2017-02-05: qty 30

## 2017-02-05 MED ORDER — FAMOTIDINE 20 MG PO TABS
20.0000 mg | ORAL_TABLET | Freq: Two times a day (BID) | ORAL | Status: DC
  Administered 2017-02-05 – 2017-02-07 (×5): 20 mg via ORAL
  Filled 2017-02-05 (×5): qty 1

## 2017-02-05 MED ORDER — ORAL CARE MOUTH RINSE
15.0000 mL | Freq: Two times a day (BID) | OROMUCOSAL | Status: DC
  Administered 2017-02-05 – 2017-02-12 (×14): 15 mL via OROMUCOSAL

## 2017-02-05 MED ORDER — HYDRALAZINE HCL 20 MG/ML IJ SOLN
10.0000 mg | Freq: Once | INTRAMUSCULAR | Status: AC
  Administered 2017-02-05: 10 mg via INTRAVENOUS
  Filled 2017-02-05: qty 1

## 2017-02-05 NOTE — Progress Notes (Signed)
Patient having echocardiogram done, will get manual blood pressure as soon as possible.

## 2017-02-05 NOTE — Progress Notes (Signed)
Patients blood pressure elevated over night, provider paged to make aware and see if fluids should be stopped.

## 2017-02-05 NOTE — Progress Notes (Signed)
Manual blood pressure 182/72, hydralazine order parameters not met, provider paged.

## 2017-02-05 NOTE — Progress Notes (Signed)
  Echocardiogram 2D Echocardiogram has been performed.  Johny Chess 02/05/2017, 12:55 PM

## 2017-02-06 DIAGNOSIS — E876 Hypokalemia: Secondary | ICD-10-CM

## 2017-02-06 DIAGNOSIS — R112 Nausea with vomiting, unspecified: Secondary | ICD-10-CM

## 2017-02-06 DIAGNOSIS — D631 Anemia in chronic kidney disease: Secondary | ICD-10-CM

## 2017-02-06 DIAGNOSIS — R509 Fever, unspecified: Secondary | ICD-10-CM

## 2017-02-06 DIAGNOSIS — I5032 Chronic diastolic (congestive) heart failure: Secondary | ICD-10-CM

## 2017-02-06 DIAGNOSIS — N183 Chronic kidney disease, stage 3 (moderate): Secondary | ICD-10-CM

## 2017-02-06 DIAGNOSIS — I313 Pericardial effusion (noninflammatory): Secondary | ICD-10-CM

## 2017-02-06 DIAGNOSIS — R1013 Epigastric pain: Secondary | ICD-10-CM

## 2017-02-06 DIAGNOSIS — I10 Essential (primary) hypertension: Secondary | ICD-10-CM

## 2017-02-06 LAB — INFLUENZA PANEL BY PCR (TYPE A & B)
Influenza A By PCR: NEGATIVE
Influenza B By PCR: NEGATIVE

## 2017-02-06 MED ORDER — LABETALOL HCL 200 MG PO TABS
400.0000 mg | ORAL_TABLET | Freq: Two times a day (BID) | ORAL | Status: DC
  Administered 2017-02-06 – 2017-02-12 (×12): 400 mg via ORAL
  Filled 2017-02-06 (×13): qty 2

## 2017-02-06 MED ORDER — TRAMADOL HCL 50 MG PO TABS
50.0000 mg | ORAL_TABLET | Freq: Four times a day (QID) | ORAL | Status: DC | PRN
  Administered 2017-02-09: 100 mg via ORAL
  Filled 2017-02-06: qty 2

## 2017-02-06 MED ORDER — METOPROLOL TARTRATE 25 MG PO TABS
25.0000 mg | ORAL_TABLET | Freq: Two times a day (BID) | ORAL | Status: DC
  Administered 2017-02-06: 25 mg via ORAL
  Filled 2017-02-06: qty 1

## 2017-02-06 MED ORDER — PANTOPRAZOLE SODIUM 40 MG PO TBEC
40.0000 mg | DELAYED_RELEASE_TABLET | Freq: Every day | ORAL | Status: DC
  Administered 2017-02-06 – 2017-02-12 (×7): 40 mg via ORAL
  Filled 2017-02-06 (×7): qty 1

## 2017-02-06 MED ORDER — SPIRONOLACTONE 25 MG PO TABS
25.0000 mg | ORAL_TABLET | Freq: Every day | ORAL | Status: DC
  Administered 2017-02-06 – 2017-02-07 (×2): 25 mg via ORAL
  Filled 2017-02-06 (×2): qty 1

## 2017-02-06 NOTE — Progress Notes (Signed)
[name] [id number] Admission Data: 02/06/2017 8:40 PM Attending Provider: Debbe Odea, MD  NOB:[id number], Real Cons, MD Consults/ Treatment Team:   [name] is a 71 y.o. female patient admitted from ED awake, alert  & orientated  X 3,  Full Code, VSS -97.5;191/69;hr=69;r=17;, O2   96% on RA;, no c/o shortness of breath, no c/o chest pain, no distress noted. Tele # 24 placed and pt is currently running:NSR.   IV site WDL:  antecubital left, condition patent and no redness with a transparent dsg that's clean dry and intact.  Allergies:   Allergies  Allergen Reactions  . Minoxidil Palpitations and Other (See Comments)    unable to sleep  . Naproxen Swelling     Past Medical History:  Diagnosis Date  . Acute pancreatitis 02/04/2017   [name] 02/04/2017  . Adenomatous colon polyp 01/2002  . Anemia, chronic disease    /notes 02/04/2017  . Arthritis    "lower back, knees" (02/04/2017)  . CHF (congestive heart failure) ([place])   . CKD (chronic kidney disease), stage III ([place])   . Diverticulosis of colon   . DJD (degenerative joint disease)   . Frequent UTI   . Heart murmur   . Hemorrhoids   . Hypertension   . Lumbar back pain   . Neuropathy   . Obesity   . Pericardial effusion     in a patient with Diastolic heart  failure and Pericardial effusion  known since last 2 D echo in 11/2016 /notes 02/04/2017  . PONV (postoperative nausea and vomiting)   . Renal cyst   . Shortness of breath dyspnea    with exertion  . Venous insufficiency   . Vitamin D deficiency     History:  obtained from the patient. Tobacco/alcohol: denied none  Pt orientation to unit, room and routine. Information packet given to patient/family and safety video watched.  Admission INP armband ID verified with patient/family, and in place. SR up x 2, fall risk assessment complete with Patient and family verbalizing understanding of risks associated with falls. Pt verbalizes an understanding of how to  use the call bell and to call for help before getting out of bed.  Skin, clean-dry- intact without evidence of bruising, or skin tears.   No evidence of skin break down noted on exam. no rashes, no ecchymoses    Will cont to monitor and assist as needed.  Marlea Gambill, Leonides Sake, RN 02/06/2017 8:40 PM

## 2017-02-06 NOTE — Progress Notes (Signed)
PROGRESS NOTE    Calissa Swenor   WUJ:[id number]  DOB: 1945/11/13  DOA: 02/04/2017 PCP: Hoyt Koch, MD   Brief Narrative:  Ginnifer Creelman  is a 71 y.o. female with medical history significant for HTN, HLD, history of moderate aortic regurgitation, history of pericardial effusion since 2018, left bundle branch block, CK B stage III, carotid artery disease, chronic bilateral lower extremity edema, brought via EMS, after developing acute onset of epigastric abdominal pain while getting dressed. She was given aspirin, nitroglycerin 2 and Zofran. No relief of her pain was achieved.   Subjective: Pain is not as severe now. Willing to advance diet. Noted to have a low grade fever today. ROS: no complaints of nausea, vomiting, constipation diarrhea, cough, dyspnea or dysuria. No other complaints.   Assessment & Plan:   Principal Problem:   Epigastric abdominal pain  -  She did have pain which feels more like indigestion and improved with GI cocktail and therefore suspect that this was more a Gastritis/ GERD rather than acute pancreatitis - CT also not suggestive of acute pancreatitis - started Pepcid and Protonix yesterday - overall pain better- resumed solid food today  Active Problems:   Hypokalemia - replaced  Fever - fever today- no symptoms of cough- influenza and UA negative- cont to follow in hospital overnight    Essential hypertension - BP has been quite elevated and I have been increasing her medications - yesterday increased Hydralazine from 10 to 50 mg TID - resume Aldactone and Labetaolol today - Medrol dose pack may have added to HTN  H/o pericardial effusion - noted to appear larger on imaging this admission - ECHO shows it to be unchanged  Aortic and Tricuspid regurg   LBBB (left bundle branch block) - needs outpt f/u    Neck pain - has post cervical pain and has been on a Medrol dose pack    CKD (chronic kidney disease) stage 3, GFR 30-59 ml/min    - stable      Anemia in chronic kidney disease - stable      DVT prophylaxis: Heparin Code Status: Full code Family Communication:  Disposition Plan: home when stable Consultants:    Procedures:   2 D ECHO Left ventricle: The cavity size was normal. Wall thickness was   increased in a pattern of moderate LVH. Systolic function was   normal. The estimated ejection fraction was in the range of 60%   to 65%. Wall motion was normal; there were no regional wall   motion abnormalities. The study is not technically sufficient to   allow evaluation of LV diastolic function. - Aortic valve: Poorly visualized. Mildly calcified leaflets. Very   mild aortic stenosis. Moderate regurgitation. Mean gradient (S):   20 mm Hg. Peak gradient (S): 34 mm Hg. Valve area (Vmean): 2.46   cm^2. Regurgitation pressure half-time: 219 ms. - Mitral valve: Calcified annulus. Mildly thickened leaflets .   There was mild, posteriorly directed regurgitation. - Left atrium: The atrium was mildly dilated. - Tricuspid valve: There was moderate regurgitation. - Pulmonary arteries: PA peak pressure: 65 mm Hg (S). - Inferior vena cava: The vessel was normal in size. The   respirophasic diameter changes were in the normal range (>= 50%),   consistent with normal central venous pressure. - Pericardium, extracardiac: Small circumferential pericardial   effusion. No clear tamponade features.  Antimicrobials:  Anti-infectives    None       Objective: Vitals:   02/06/17 0648 02/06/17 0800  02/06/17 1305 02/06/17 1312  BP: (!) 219/68 (!) 198/72 (!) 178/61   Pulse: (!) 106  (!) 101   Resp: 18     Temp: (!) 100.4 F (38 C)  99.4 F (37.4 C)   TempSrc: Oral  Oral   SpO2: 99%  91% 100%  Weight:      Height:        Intake/Output Summary (Last 24 hours) at 02/06/17 1451 Last data filed at 02/06/17 0925  Gross per 24 hour  Intake              100 ml  Output             2200 ml  Net            -2100  ml   Filed Weights   02/04/17 1955 02/05/17 0425 02/06/17 0647  Weight: 103.6 kg (228 lb 6.4 oz) 101.3 kg (223 lb 6.4 oz) 99.3 kg (219 lb)    Examination: General exam: Appears comfortable  HEENT: PERRLA, oral mucosa moist, no sclera icterus or thrush Respiratory system: Clear to auscultation. Respiratory effort normal. Cardiovascular system: S1 & S2 heard, RRR.  No murmurs  Gastrointestinal system: Abdomen soft, non-tender, nondistended. Normal bowel sound. No organomegaly Central nervous system: Alert and oriented. No focal neurological deficits. Extremities: No cyanosis, clubbing or edema Skin: No rashes or ulcers Psychiatry:  Mood & affect appropriate.     Data Reviewed: I have personally reviewed following labs and imaging studies  CBC:  Recent Labs Lab 02/04/17 1308 02/05/17 0635  WBC 10.1 9.2  NEUTROABS 8.0*  --   HGB 9.8* 9.4*  HCT 31.3* 30.3*  MCV 92.1 92.7  PLT 225 098   Basic Metabolic Panel:  Recent Labs Lab 02/04/17 1308 02/05/17 0635  NA 140 140  K 3.4* 4.1  CL 103 104  CO2 29 26  GLUCOSE 132* 118*  BUN 28* 30*  CREATININE 1.64* 1.57*  CALCIUM 9.5 9.3   GFR: Estimated Creatinine Clearance: 39.1 mL/min (A) (by C-G formula based on SCr of 1.57 mg/dL (H)). Liver Function Tests:  Recent Labs Lab 02/04/17 1308 02/05/17 0635  AST 36 36  ALT 37 37  ALKPHOS 68 64  BILITOT 0.6 0.6  PROT 6.6 6.4*  ALBUMIN 3.0* 2.8*    Recent Labs Lab 02/04/17 1308  LIPASE 110*   No results for input(s): AMMONIA in the last 168 hours. Coagulation Profile:  Recent Labs Lab 02/05/17 0635  INR 1.16   Cardiac Enzymes: No results for input(s): CKTOTAL, CKMB, CKMBINDEX, TROPONINI in the last 168 hours. BNP (last 3 results)  Recent Labs  11/27/16 1036  PROBNP 202.0*   HbA1C:  Recent Labs  02/04/17 2101  HGBA1C 5.5   CBG: No results for input(s): GLUCAP in the last 168 hours. Lipid Profile:  Recent Labs  02/04/17 2101  CHOL 141  HDL 49   LDLCALC 76  TRIG 78  CHOLHDL 2.9   Thyroid Function Tests: No results for input(s): TSH, T4TOTAL, FREET4, T3FREE, THYROIDAB in the last 72 hours. Anemia Panel: No results for input(s): VITAMINB12, FOLATE, FERRITIN, TIBC, IRON, RETICCTPCT in the last 72 hours. Urine analysis:    Component Value Date/Time   COLORURINE STRAW (A) 02/05/2017 2215   APPEARANCEUR CLEAR 02/05/2017 2215   LABSPEC 1.006 02/05/2017 2215   PHURINE 5.0 02/05/2017 2215   GLUCOSEU NEGATIVE 02/05/2017 2215   GLUCOSEU NEGATIVE 12/08/2015 1024   HGBUR NEGATIVE 02/05/2017 2215   BILIRUBINUR NEGATIVE 02/05/2017 2215   BILIRUBINUR  neg 01/04/2016 1054   KETONESUR NEGATIVE 02/05/2017 2215   PROTEINUR NEGATIVE 02/05/2017 2215   UROBILINOGEN negative 01/04/2016 1054   UROBILINOGEN 0.2 12/08/2015 1024   NITRITE NEGATIVE 02/05/2017 2215   LEUKOCYTESUR NEGATIVE 02/05/2017 2215   Sepsis Labs: @LABRCNTIP (procalcitonin:4,lacticidven:4) )No results found for this or any previous visit (from the past 240 hour(s)).       Radiology Studies: X-ray Chest Pa And Lateral  Result Date: 02/05/2017 CLINICAL DATA:  Congestive heart failure short of breath EXAM: CHEST  2 VIEW COMPARISON:  02/04/2017 FINDINGS: Marked cardiac enlargement unchanged. Mild vascular congestion without edema. Mild left lower lobe atelectasis and effusion. IMPRESSION: Cardiac enlargement with pulmonary vascular congestion. Mild left lower lobe atelectasis and small left effusion unchanged. Electronically Signed   By: Franchot Gallo M.D.   On: 02/05/2017 09:15      Scheduled Meds: . amLODipine  10 mg Oral Daily  . calcitRIOL  0.5 mcg Oral Daily  . famotidine  20 mg Oral BID  . gabapentin  300 mg Oral TID  . heparin  5,000 Units Subcutaneous Q8H  . hydrALAZINE  50 mg Oral TID  . losartan  100 mg Oral Daily  . magnesium gluconate  500 mg Oral BID  . mouth rinse  15 mL Mouth Rinse BID  . metoprolol tartrate  25 mg Oral BID  . pantoprazole  40 mg  Oral Daily   Continuous Infusions:   LOS: 2 days    Time spent in minutes: 35    Debbe Odea, MD [facility] Pager: [email or web address] Password [email or web address] 02/06/2017, 2:51 PM

## 2017-02-06 NOTE — Consult Note (Signed)
[facility] CM Primary Care Navigator  02/06/2017  [name] 03/08/1946 [id number]  Met with patient and husband [name]) at the bedside to identify possibledischarge needs. Patient shared having persistent epigastric pain that had led to this admission.   Patient endorses Dr. Pricilla Holm with [place] [facility] [place] as the primary care provider.    [facility]'s [facility]ates [facility] [address] [facility] without difficulty.  Patient statesmanaging herown medications at home straight out of the containers but uses "pill box" when travelling.  Patient's husband providestransportation to herdoctors' appointments.  Husband will be theprimary caregiver at home when needed, per patient.   Anticipated discharge plan is home according to patient.  Patientexpressed understanding to callprimary care provider's office when she gets home to schedule a post hospital follow-up appointment within a week or sooner if needs arise.Patient letter (with PCP's contact number) wasprovided as a reminder.  Explained to patient and about [facility] CM services available for healthmanagement at home. Patient verbalized that she is managing at home and HF "has not been a pressing issue" for her.  Patient voiced understanding toseekreferral to [facility] care managementservicesfrom primary care provider if deemed necessary and appropriate in the future.   [facility] care management contact information provided for future needs that may arise.  Patient only agreed and opted for [facility] Calls to follow-up with her recovery at home.   Referral made for [facility] General Calls after discharge.   For additional questions please contact:  Edwena Felty A. Zanita Millman, BSN, RN-BC [facility] PRIMARY CARE Navigator Cell: [phone]

## 2017-02-07 DIAGNOSIS — N179 Acute kidney failure, unspecified: Secondary | ICD-10-CM

## 2017-02-07 LAB — BASIC METABOLIC PANEL
Anion gap: 5 (ref 5–15)
BUN: 45 mg/dL — ABNORMAL HIGH (ref 6–20)
CALCIUM: 8.9 mg/dL (ref 8.9–10.3)
CHLORIDE: 102 mmol/L (ref 101–111)
CO2: 30 mmol/L (ref 22–32)
CREATININE: 2.51 mg/dL — AB (ref 0.44–1.00)
GFR, EST AFRICAN AMERICAN: 21 mL/min — AB (ref >60)
GFR, EST NON AFRICAN AMERICAN: 18 mL/min — AB (ref >60)
GLUCOSE: 97 mg/dL (ref 65–99)
Potassium: 3.5 mmol/L (ref 3.5–5.1)
Sodium: 137 mmol/L (ref 135–145)

## 2017-02-07 LAB — CBC
HCT: 29.6 % — ABNORMAL LOW (ref 36.0–46.0)
HEMOGLOBIN: 9 g/dL — AB (ref 12.0–15.0)
MCH: 28.6 pg (ref 26.0–34.0)
MCHC: 30.4 g/dL (ref 30.0–36.0)
MCV: 94 fL (ref 78.0–100.0)
PLATELETS: 164 10*3/uL (ref 150–400)
RBC: 3.15 MIL/uL — AB (ref 3.87–5.11)
RDW: 18.2 % — ABNORMAL HIGH (ref 11.5–15.5)
WBC: 9.2 10*3/uL (ref 4.0–10.5)

## 2017-02-07 MED ORDER — DICLOFENAC SODIUM 1 % TD GEL
2.0000 g | Freq: Four times a day (QID) | TRANSDERMAL | Status: DC
  Administered 2017-02-07 – 2017-02-12 (×20): 2 g via TOPICAL
  Filled 2017-02-07: qty 100

## 2017-02-07 MED ORDER — SODIUM CHLORIDE 0.9 % IV SOLN
INTRAVENOUS | Status: DC
  Administered 2017-02-07 – 2017-02-08 (×2): via INTRAVENOUS

## 2017-02-07 MED ORDER — LEVOFLOXACIN 750 MG PO TABS
750.0000 mg | ORAL_TABLET | ORAL | Status: DC
  Administered 2017-02-07 – 2017-02-09 (×2): 750 mg via ORAL
  Filled 2017-02-07 (×2): qty 1

## 2017-02-07 MED ORDER — FAMOTIDINE 20 MG PO TABS
20.0000 mg | ORAL_TABLET | Freq: Every day | ORAL | Status: DC
  Administered 2017-02-08 – 2017-02-12 (×5): 20 mg via ORAL
  Filled 2017-02-07 (×5): qty 1

## 2017-02-07 MED ORDER — GABAPENTIN 300 MG PO CAPS
300.0000 mg | ORAL_CAPSULE | Freq: Two times a day (BID) | ORAL | Status: DC
  Administered 2017-02-07 – 2017-02-12 (×10): 300 mg via ORAL
  Filled 2017-02-07 (×11): qty 1

## 2017-02-07 NOTE — Progress Notes (Signed)
Pharmacy Antibiotic Note  [name] is a 71 y.o. female admitted on 02/04/2017 with epigastric abdominal pain.  Pharmacy has been consulted for Levaquin dosing for CAP. SCr rising so will need to assess renal function and adjust dose. She is febrile, WBC are normal, and there is no culture data.  Plan: Levaquin 750 mg PO q48h  Monitor renal function, clinical progress, culture data  Height: 5\' 6"  (167.6 cm) Weight: 219 lb (99.3 kg) IBW/kg (Calculated) : 59.3  Temp (24hrs), Avg:99.4 F (37.4 C), Min:98.2 F (36.8 C), Max:100.6 F (38.1 C)   Recent Labs Lab 02/04/17 1308 02/05/17 0635 02/07/17 0819  WBC 10.1 9.2 9.2  CREATININE 1.64* 1.57* 2.51*    Estimated Creatinine Clearance: 24.4 mL/min (A) (by C-G formula based on SCr of 2.51 mg/dL (H)).    Allergies  Allergen Reactions  . Minoxidil Palpitations and Other (See Comments)    unable to sleep  . Naproxen Swelling     Thank you for allowing pharmacy to be a part of this patient's care.  Renold Genta, PharmD, BCPS Clinical Pharmacist Phone for today - [place] - [phone] 02/07/2017 11:43 AM

## 2017-02-07 NOTE — Progress Notes (Signed)
PROGRESS NOTE    [name]   [id number]  DOB: 1946-03-14  DOA: 02/04/2017 PCP: Hoyt Koch, MD   Brief Narrative:  [name]  is a 71 y.o. female with medical history significant for HTN, HLD, history of moderate aortic regurgitation, history of pericardial effusion since 2018, left bundle branch block, CK B stage III, carotid artery disease, chronic bilateral lower extremity edema, brought via EMS, after developing acute onset of epigastric abdominal pain while getting dressed. She was given aspirin, nitroglycerin 2 and Zofran. No relief of her pain was achieved.   Subjective: Felt fever and chills today.   ROS: no complaints of nausea, vomiting, constipation diarrhea, cough, dyspnea or dysuria. No other complaints.   Assessment & Plan:   Principal Problem:   Epigastric abdominal pain  -  She did have pain which feels more like indigestion and improved with GI cocktail and therefore suspect that this was more a Gastritis/ GERD rather than acute pancreatitis - CT also not suggestive of acute pancreatitis - started Pepcid and Protonix yesterday - overall pain better- resumed solid food - she is stable  Active Problems:   Hypokalemia - replaced  Fever - fever again today- influenza and UA negative- some crackles in LLL- start Levaquin and follow  AKI - may be due to resumption of Aldactone- she states she has been eating well but may not be drinking well and fevers can also add to dehydration -  stop Aldactone- start slow IVF and follow  Hypomagnesemia - has had loose stool today likely from [place]- will place on on Mag gluconate and replace with IV Mg as well    Essential hypertension - BP has been quite elevated and I have been increasing her medications - yesterday increased Hydralazine from 10 to 50 mg TID - resume Aldactone and Labetaolol today - Medrol dose pack may have added to HTN  H/o pericardial effusion - noted to appear larger on  imaging this admission - ECHO shows it to be unchanged  Aortic and Tricuspid regurg   LBBB (left bundle branch block) - needs outpt f/u    Neck pain - has post cervical pain and has been on a Medrol dose pack    CKD (chronic kidney disease) stage 3, GFR 30-59 ml/min  - stable      Anemia in chronic kidney disease - stable      DVT prophylaxis: Heparin Code Status: Full code Family Communication:  Disposition Plan: home when stable Consultants:    Procedures:   2 D ECHO Left ventricle: The cavity size was normal. Wall thickness was   increased in a pattern of moderate LVH. Systolic function was   normal. The estimated ejection fraction was in the range of 60%   to 65%. Wall motion was normal; there were no regional wall   motion abnormalities. The study is not technically sufficient to   allow evaluation of LV diastolic function. - Aortic valve: Poorly visualized. Mildly calcified leaflets. Very   mild aortic stenosis. Moderate regurgitation. Mean gradient (S):   20 mm Hg. Peak gradient (S): 34 mm Hg. Valve area (Vmean): 2.46   cm^2. Regurgitation pressure half-time: 219 ms. - Mitral valve: Calcified annulus. Mildly thickened leaflets .   There was mild, posteriorly directed regurgitation. - Left atrium: The atrium was mildly dilated. - Tricuspid valve: There was moderate regurgitation. - Pulmonary arteries: PA peak pressure: 65 mm Hg (S). - Inferior vena cava: The vessel was normal in size. The  respirophasic diameter changes were in the normal range (>= 50%),   consistent with normal central venous pressure. - Pericardium, extracardiac: Small circumferential pericardial   effusion. No clear tamponade features.  Antimicrobials:  Anti-infectives    Start     Dose/Rate Route Frequency Ordered Stop   02/07/17 1200  levofloxacin (LEVAQUIN) tablet 750 mg     750 mg Oral Every 48 hours 02/07/17 1141         Objective: Vitals:   02/06/17 1851 02/06/17 2217  02/07/17 0600 02/07/17 1347  BP: (!) 125/52 (!) 135/52 (!) 157/52 (!) 128/47  Pulse:  81 86 86  Resp:  18 18 18   Temp:  98.2 F (36.8 C) (!) 100.6 F (38.1 C) 99.7 F (37.6 C)  TempSrc:  Oral Oral Oral  SpO2:  97% 98% 95%  Weight:      Height:       No intake or output data in the 24 hours ending 02/07/17 [place]   02/04/17 1955 02/05/17 0425 02/06/17 0647  Weight: 103.6 kg (228 lb 6.4 oz) 101.3 kg (223 lb 6.4 oz) 99.3 kg (219 lb)    Examination: General exam: Appears comfortable  HEENT: PERRLA, oral mucosa moist, no sclera icterus or thrush Respiratory system: Clear to auscultation. Respiratory effort normal. Cardiovascular system: S1 & S2 heard,  No murmurs  Gastrointestinal system: Abdomen soft, non-tender, nondistended. Normal bowel sound. No organomegaly Central nervous system: Alert and oriented. No focal neurological deficits. Extremities: No cyanosis, clubbing or edema Skin: No rashes or ulcers Psychiatry:  Mood & affect appropriate.     Data Reviewed: I have personally reviewed following labs and imaging studies  CBC:  Recent Labs Lab 02/04/17 1308 02/05/17 0635 02/07/17 0819  WBC 10.1 9.2 9.2  NEUTROABS 8.0*  --   --   HGB 9.8* 9.4* 9.0*  HCT 31.3* 30.3* 29.6*  MCV 92.1 92.7 94.0  PLT 225 178 619   Basic Metabolic Panel:  Recent Labs Lab 02/04/17 1308 02/05/17 0635 02/07/17 0819  NA 140 140 137  K 3.4* 4.1 3.5  CL 103 104 102  CO2 29 26 30   GLUCOSE 132* 118* 97  BUN 28* 30* 45*  CREATININE 1.64* 1.57* 2.51*  CALCIUM 9.5 9.3 8.9   GFR: Estimated Creatinine Clearance: 24.4 mL/min (A) (by C-G formula based on SCr of 2.51 mg/dL (H)). Liver Function Tests:  Recent Labs Lab 02/04/17 1308 02/05/17 0635  AST 36 36  ALT 37 37  ALKPHOS 68 64  BILITOT 0.6 0.6  PROT 6.6 6.4*  ALBUMIN 3.0* 2.8*    Recent Labs Lab 02/04/17 1308  LIPASE 110*   No results for input(s): AMMONIA in the last 168 hours. Coagulation  Profile:  Recent Labs Lab 02/05/17 0635  INR 1.16   Cardiac Enzymes: No results for input(s): CKTOTAL, CKMB, CKMBINDEX, TROPONINI in the last 168 hours. BNP (last 3 results)  Recent Labs  11/27/16 1036  PROBNP 202.0*   HbA1C:  Recent Labs  02/04/17 2101  HGBA1C 5.5   CBG: No results for input(s): GLUCAP in the last 168 hours. Lipid Profile:  Recent Labs  02/04/17 2101  CHOL 141  HDL 49  LDLCALC 76  TRIG 78  CHOLHDL 2.9   Thyroid Function Tests: No results for input(s): TSH, T4TOTAL, FREET4, T3FREE, THYROIDAB in the last 72 hours. Anemia Panel: No results for input(s): VITAMINB12, FOLATE, FERRITIN, TIBC, IRON, RETICCTPCT in the last 72 hours. Urine analysis:    Component Value Date/Time   COLORURINE  STRAW (A) 02/05/2017 2215   APPEARANCEUR CLEAR 02/05/2017 2215   LABSPEC 1.006 02/05/2017 2215   PHURINE 5.0 02/05/2017 2215   GLUCOSEU NEGATIVE 02/05/2017 2215   GLUCOSEU NEGATIVE 12/08/2015 1024   HGBUR NEGATIVE 02/05/2017 2215   BILIRUBINUR NEGATIVE 02/05/2017 2215   BILIRUBINUR neg 01/04/2016 1054   [place] 02/05/2017 [place] 02/05/2017 2215   UROBILINOGEN negative 01/04/2016 1054   UROBILINOGEN 0.2 12/08/2015 1024   NITRITE NEGATIVE 02/05/2017 2215   LEUKOCYTESUR NEGATIVE 02/05/2017 2215   Sepsis Labs: @LABRCNTIP (procalcitonin:4,lacticidven:4) )No results found for this or any previous visit (from the past 240 hour(s)).       Radiology Studies: No results found.    Scheduled Meds: . amLODipine  10 mg Oral Daily  . calcitRIOL  0.5 mcg Oral Daily  . diclofenac sodium  2 g Topical QID  . [START ON 02/08/2017] famotidine  20 mg Oral Daily  . gabapentin  300 mg Oral BID  . heparin  5,000 Units Subcutaneous Q8H  . hydrALAZINE  50 mg Oral TID  . labetalol  400 mg Oral BID  . levofloxacin  750 mg Oral Q48H  . magnesium gluconate  500 mg Oral BID  . mouth rinse  15 mL Mouth Rinse BID  . pantoprazole  40 mg Oral  Daily   Continuous Infusions: . sodium chloride       LOS: 3 days    Time spent in minutes: 35    Debbe Odea, MD [facility] Pager: [email or web address] Password [email or web address] 02/07/2017, 2:55 PM

## 2017-02-08 DIAGNOSIS — M542 Cervicalgia: Secondary | ICD-10-CM

## 2017-02-08 DIAGNOSIS — J988 Other specified respiratory disorders: Secondary | ICD-10-CM

## 2017-02-08 LAB — BASIC METABOLIC PANEL
ANION GAP: 6 (ref 5–15)
BUN: 49 mg/dL — AB (ref 6–20)
CHLORIDE: 103 mmol/L (ref 101–111)
CO2: 28 mmol/L (ref 22–32)
Calcium: 8.8 mg/dL — ABNORMAL LOW (ref 8.9–10.3)
Creatinine, Ser: 2.91 mg/dL — ABNORMAL HIGH (ref 0.44–1.00)
GFR calc Af Amer: 18 mL/min — ABNORMAL LOW (ref >60)
GFR, EST NON AFRICAN AMERICAN: 15 mL/min — AB (ref >60)
GLUCOSE: 109 mg/dL — AB (ref 65–99)
POTASSIUM: 3.3 mmol/L — AB (ref 3.5–5.1)
Sodium: 137 mmol/L (ref 135–145)

## 2017-02-08 LAB — MAGNESIUM: Magnesium: 1.8 mg/dL (ref 1.7–2.4)

## 2017-02-08 MED ORDER — SPIRONOLACTONE 25 MG PO TABS
50.0000 mg | ORAL_TABLET | Freq: Two times a day (BID) | ORAL | Status: DC
  Administered 2017-02-08 – 2017-02-10 (×6): 50 mg via ORAL
  Filled 2017-02-08 (×7): qty 2

## 2017-02-08 MED ORDER — HYDRALAZINE HCL 10 MG PO TABS
10.0000 mg | ORAL_TABLET | Freq: Three times a day (TID) | ORAL | Status: DC
  Administered 2017-02-08 – 2017-02-12 (×11): 10 mg via ORAL
  Filled 2017-02-08 (×12): qty 1

## 2017-02-08 MED ORDER — FUROSEMIDE 10 MG/ML IJ SOLN
40.0000 mg | Freq: Two times a day (BID) | INTRAMUSCULAR | Status: DC
  Administered 2017-02-08 – 2017-02-10 (×6): 40 mg via INTRAVENOUS
  Filled 2017-02-08 (×6): qty 4

## 2017-02-08 NOTE — Progress Notes (Addendum)
PROGRESS NOTE    [name]   BJS:[id number]  DOB: [birth year]  DOA: 02/04/2017 PCP: Hoyt Koch, MD   Brief Narrative:  [name]  is a 71 y.o. female with medical history significant for HTN, HLD, history of moderate aortic regurgitation, history of pericardial effusion since 2018, left bundle branch block, CK B stage III, carotid artery disease, chronic bilateral lower extremity edema, brought via EMS, after developing acute onset of epigastric abdominal pain while getting dressed. She was given aspirin, nitroglycerin 2 and Zofran. No relief of her pain was achieved.   Subjective: Coughing up small amounts of mucous. Feels short of breath when moving around. No chest pain  ROS: no complaints of nausea, vomiting, constipation diarrhea or dysuria. No other complaints.   Assessment & Plan:   Principal Problem:   Epigastric abdominal pain  -  She did have pain which feels more like indigestion and improved with GI cocktail and therefore suspect that this was more a Gastritis/ GERD rather than acute pancreatitis - CT also not suggestive of acute pancreatitis - started Pepcid and Protonix yesterday - overall pain better- resumed solid food - she is stable  Active Problems:   Hypokalemia - replaced- starting on Aldactone today and therefore will hold off on giving K  Fever - - influenza and UA negative- some crackles in LLL- started Levaquin on 11/2- fever curve improved- she is beginning to cough more now- she has either a bronchitis or pneumonia  AKI - Cr worsening- Weight up today- will diurese with Lasix and Aldactone today  Hypomagnesemia - has had loose stool likely from Mag Oxide which was discontinued- placed on on Mag gluconate and replaced with IV Mg as well    Essential hypertension - BP improved- cut back on Hydralazine back to home dose as she will be receiving diuretics today - Medrol dose pack may have added to HTN   Posterior Neck pain - has  post cervical pain and has been on a Medrol dose pack which finished on 11/1- she has no radiculopathy - Voltaren gel appears to be helping the pain  H/o pericardial effusion - noted to appear larger on imaging this admission - ECHO shows it to be unchanged  Aortic and Tricuspid regurg   LBBB (left bundle branch block) - needs outpt f/u    CKD (chronic kidney disease) stage 3, GFR 30-59 ml/min  - stable      Anemia in chronic kidney disease - stable      DVT prophylaxis: Heparin Code Status: Full code Family Communication:  Disposition Plan: home when stable Consultants:    Procedures:   2 D ECHO Left ventricle: The cavity size was normal. Wall thickness was   increased in a pattern of moderate LVH. Systolic function was   normal. The estimated ejection fraction was in the range of 60%   to 65%. Wall motion was normal; there were no regional wall   motion abnormalities. The study is not technically sufficient to   allow evaluation of LV diastolic function. - Aortic valve: Poorly visualized. Mildly calcified leaflets. Very   mild aortic stenosis. Moderate regurgitation. Mean gradient (S):   20 mm Hg. Peak gradient (S): 34 mm Hg. Valve area (Vmean): 2.46   cm^2. Regurgitation pressure half-time: 219 ms. - Mitral valve: Calcified annulus. Mildly thickened leaflets .   There was mild, posteriorly directed regurgitation. - Left atrium: The atrium was mildly dilated. - Tricuspid valve: There was moderate regurgitation. - Pulmonary arteries: PA  peak pressure: 65 mm Hg (S). - Inferior vena cava: The vessel was normal in size. The   respirophasic diameter changes were in the normal range (>= 50%),   consistent with normal central venous pressure. - Pericardium, extracardiac: Small circumferential pericardial   effusion. No clear tamponade features.  Antimicrobials:  Anti-infectives    Start     Dose/Rate Route Frequency Ordered Stop   02/07/17 1200  levofloxacin  (LEVAQUIN) tablet 750 mg     750 mg Oral Every 48 hours 02/07/17 1141         Objective: Vitals:   02/08/17 0353 02/08/17 0954 02/08/17 1347 02/08/17 1354  BP: 135/65 (!) 152/64 (!) 128/47 (!) 128/47  Pulse: 84  83 83  Resp: 20  20 20   Temp: 99.1 F (37.3 C)  98.4 F (36.9 C) 98.4 F (36.9 C)  TempSrc: Oral  Oral Oral  SpO2: 92%  93% 96%  Weight: 102.6 kg (226 lb 3.2 oz)     Height:        Intake/Output Summary (Last 24 hours) at 02/08/17 1526 Last data filed at 02/08/17 1515  Gross per 24 hour  Intake            887.5 ml  Output              900 ml  Net            -12.5 ml   Filed Weights   02/05/17 0425 02/06/17 0647 02/08/17 0353  Weight: 101.3 kg (223 lb 6.4 oz) 99.3 kg (219 lb) 102.6 kg (226 lb 3.2 oz)    Examination: General exam: Appears comfortable  HEENT: PERRLA, oral mucosa moist, no sclera icterus or thrush Respiratory system: Clear to auscultation. Respiratory effort normal. 96% on 2 L today Cardiovascular system: S1 & S2 heard,  No murmurs  Gastrointestinal system: Abdomen soft, non-tender, nondistended. Normal bowel sound. No organomegaly Central nervous system: Alert and oriented. No focal neurological deficits. Extremities: No cyanosis, clubbing or edema Skin: No rashes or ulcers Psychiatry:  Mood & affect appropriate.     Data Reviewed: I have personally reviewed following labs and imaging studies  CBC:  Recent Labs Lab 02/04/17 1308 02/05/17 0635 02/07/17 0819  WBC 10.1 9.2 9.2  NEUTROABS 8.0*  --   --   HGB 9.8* 9.4* 9.0*  HCT 31.3* 30.3* 29.6*  MCV 92.1 92.7 94.0  PLT 225 178 751   Basic Metabolic Panel:  Recent Labs Lab 02/04/17 1308 02/05/17 0635 02/07/17 0819 02/08/17 0752 02/08/17 1029  NA 140 140 137 137  --   K 3.4* 4.1 3.5 3.3*  --   CL 103 104 102 103  --   CO2 29 26 30 28   --   GLUCOSE 132* 118* 97 109*  --   BUN 28* 30* 45* 49*  --   CREATININE 1.64* 1.57* 2.51* 2.91*  --   CALCIUM 9.5 9.3 8.9 8.8*  --   MG   --   --   --   --  1.8   GFR: Estimated Creatinine Clearance: 21.4 mL/min (A) (by C-G formula based on SCr of 2.91 mg/dL (H)). Liver Function Tests:  Recent Labs Lab 02/04/17 1308 02/05/17 0635  AST 36 36  ALT 37 37  ALKPHOS 68 64  BILITOT 0.6 0.6  PROT 6.6 6.4*  ALBUMIN 3.0* 2.8*    Recent Labs Lab 02/04/17 1308  LIPASE 110*   No results for input(s): AMMONIA in the last 168 hours. Coagulation  Profile:  Recent Labs Lab 02/05/17 0635  INR 1.16   Cardiac Enzymes: No results for input(s): CKTOTAL, CKMB, CKMBINDEX, TROPONINI in the last 168 hours. BNP (last 3 results)  Recent Labs  11/27/16 1036  PROBNP 202.0*   HbA1C: No results for input(s): HGBA1C in the last 72 hours. CBG: No results for input(s): GLUCAP in the last 168 hours. Lipid Profile: No results for input(s): CHOL, HDL, LDLCALC, TRIG, CHOLHDL, LDLDIRECT in the last 72 hours. Thyroid Function Tests: No results for input(s): TSH, T4TOTAL, FREET4, T3FREE, THYROIDAB in the last 72 hours. Anemia Panel: No results for input(s): VITAMINB12, FOLATE, FERRITIN, TIBC, IRON, RETICCTPCT in the last 72 hours. Urine analysis:    Component Value Date/Time   COLORURINE STRAW (A) 02/05/2017 2215   APPEARANCEUR CLEAR 02/05/2017 2215   LABSPEC 1.006 02/05/2017 2215   PHURINE 5.0 02/05/2017 2215   GLUCOSEU NEGATIVE 02/05/2017 2215   GLUCOSEU NEGATIVE 12/08/2015 1024   HGBUR NEGATIVE 02/05/2017 2215   BILIRUBINUR NEGATIVE 02/05/2017 2215   BILIRUBINUR neg 01/04/2016 1054   [place] 02/05/2017 2215   PROTEINUR NEGATIVE 02/05/2017 2215   UROBILINOGEN negative 01/04/2016 1054   UROBILINOGEN 0.2 12/08/2015 1024   NITRITE NEGATIVE 02/05/2017 2215   LEUKOCYTESUR NEGATIVE 02/05/2017 2215   Sepsis Labs: @LABRCNTIP (procalcitonin:4,lacticidven:4) )No results found for this or any previous visit (from the past 240 hour(s)).       Radiology Studies: No results found.    Scheduled Meds: . amLODipine   10 mg Oral Daily  . calcitRIOL  0.5 mcg Oral Daily  . diclofenac sodium  2 g Topical QID  . famotidine  20 mg Oral Daily  . furosemide  40 mg Intravenous Q12H  . gabapentin  300 mg Oral BID  . heparin  5,000 Units Subcutaneous Q8H  . hydrALAZINE  10 mg Oral TID  . labetalol  400 mg Oral BID  . levofloxacin  750 mg Oral Q48H  . mouth rinse  15 mL Mouth Rinse BID  . pantoprazole  40 mg Oral Daily  . spironolactone  50 mg Oral BID   Continuous Infusions:    LOS: 4 days    Time spent in minutes: 35    Debbe Odea, MD [facility] Pager: [email or web address] Password [email or web address] 02/08/2017, 3:26 PM

## 2017-02-09 ENCOUNTER — Other Ambulatory Visit: Payer: Self-pay

## 2017-02-09 LAB — BASIC METABOLIC PANEL
ANION GAP: 8 (ref 5–15)
BUN: 48 mg/dL — ABNORMAL HIGH (ref 6–20)
CHLORIDE: 100 mmol/L — AB (ref 101–111)
CO2: 31 mmol/L (ref 22–32)
Calcium: 9.4 mg/dL (ref 8.9–10.3)
Creatinine, Ser: 2.74 mg/dL — ABNORMAL HIGH (ref 0.44–1.00)
GFR, EST AFRICAN AMERICAN: 19 mL/min — AB (ref >60)
GFR, EST NON AFRICAN AMERICAN: 16 mL/min — AB (ref >60)
Glucose, Bld: 107 mg/dL — ABNORMAL HIGH (ref 65–99)
Potassium: 3.4 mmol/L — ABNORMAL LOW (ref 3.5–5.1)
Sodium: 139 mmol/L (ref 135–145)

## 2017-02-09 LAB — MAGNESIUM: Magnesium: 1.8 mg/dL (ref 1.7–2.4)

## 2017-02-09 MED ORDER — POTASSIUM CHLORIDE CRYS ER 20 MEQ PO TBCR
40.0000 meq | EXTENDED_RELEASE_TABLET | Freq: Once | ORAL | Status: AC
  Administered 2017-02-09: 40 meq via ORAL
  Filled 2017-02-09: qty 2

## 2017-02-09 MED ORDER — MAGNESIUM SULFATE 2 GM/50ML IV SOLN
2.0000 g | Freq: Once | INTRAVENOUS | Status: AC
  Administered 2017-02-09: 2 g via INTRAVENOUS
  Filled 2017-02-09: qty 50

## 2017-02-09 NOTE — Progress Notes (Signed)
SATURATION QUALIFICATIONS: (This note is used to comply with regulatory documentation for home oxygen)  Patient Saturations on Room Air at Rest = 94%  Patient Saturations on Room Air while Ambulating = 85%  Patient Saturations on 1 Liters of oxygen while Ambulating = 96%  Please briefly explain why patient needs home oxygen:

## 2017-02-09 NOTE — Progress Notes (Addendum)
PROGRESS NOTE    [name]   SNK:[id number]  DOB: 03/01/1946  DOA: 02/04/2017 PCP: Hoyt Koch, MD   Brief Narrative:  [name]  is a 71 y.o. female with medical history significant for HTN, HLD, history of moderate aortic regurgitation, history of pericardial effusion since 2018, left bundle branch block, CK B stage III, carotid artery disease, chronic bilateral lower extremity edema, brought via EMS, after developing acute onset of epigastric abdominal pain while getting dressed. She was given aspirin, nitroglycerin 2 and Zofran. No relief of her pain was achieved.   Subjective: Breathing better today at rest but still short of breath with exertion. Coughing up clear sputum and blowing out large amounts of it from her nose as well.  ROS: no complaints of nausea, vomiting, constipation diarrhea or dysuria. No other complaints.   Assessment & Plan:   Principal Problem:   Epigastric abdominal pain  -  She did have pain which feels more like indigestion and improved with GI cocktail and therefore suspect that this was more a Gastritis/ GERD rather than acute pancreatitis - CT also not suggestive of acute pancreatitis - started Pepcid and Protonix yesterday - overall pain better- resumed solid food - she is stable  Active Problems:   Hypokalemia - replaced    Fever/ acute respiratory failure - influenza and UA negative- continues to have some crackles in LLL- started Levaquin on 11/2- fever curve improved- she is beginning to cough more now- she has either a bronchitis or pneumonia - now has more sputum- add duoneb to help her expectorate more  Nasal discharge - Start Flonase   AKI/ CKD 3 - Cr worsening over the past few days- Weight went up- began to diurese with Lasix and Aldactone - Cr better today- will need to continue to diurese today  Hypomagnesemia - has had loose stool likely from Mag Oxide which was discontinued-  replaced with IV Mg     Essential hypertension - BP improved  - Medrol dose pack may have added to HTN   Posterior Neck pain - has post cervical pain and has been on a Medrol dose pack which finished on 11/1- she has no radiculopathy - Voltaren gel continues to be helping the pain - can cont Valium PRN which she was using at home  H/o pericardial effusion - noted to appear larger on imaging this admission - ECHO shows it to be unchanged   Mod Aortic and Mod Tricuspid regurg   LBBB (left bundle branch block) - needs outpt f/u     Anemia in chronic kidney disease - stable   DVT prophylaxis: Heparin Code Status: Full code Family Communication:  Disposition Plan: home when stable Consultants:    Procedures:   2 D ECHO Left ventricle: The cavity size was normal. Wall thickness was   increased in a pattern of moderate LVH. Systolic function was   normal. The estimated ejection fraction was in the range of 60%   to 65%. Wall motion was normal; there were no regional wall   motion abnormalities. The study is not technically sufficient to   allow evaluation of LV diastolic function. - Aortic valve: Poorly visualized. Mildly calcified leaflets. Very   mild aortic stenosis. Moderate regurgitation. Mean gradient (S):   20 mm Hg. Peak gradient (S): 34 mm Hg. Valve area (Vmean): 2.46   cm^2. Regurgitation pressure half-time: 219 ms. - Mitral valve: Calcified annulus. Mildly thickened leaflets .   There was mild, posteriorly directed regurgitation. -  Left atrium: The atrium was mildly dilated. - Tricuspid valve: There was moderate regurgitation. - Pulmonary arteries: PA peak pressure: 65 mm Hg (S). - Inferior vena cava: The vessel was normal in size. The   respirophasic diameter changes were in the normal range (>= 50%),   consistent with normal central venous pressure. - Pericardium, extracardiac: Small circumferential pericardial   effusion. No clear tamponade features.  Antimicrobials:    Anti-infectives (From admission, onward)   Start     Dose/Rate Route Frequency Ordered Stop   02/07/17 1200  levofloxacin (LEVAQUIN) tablet 750 mg     750 mg Oral Every 48 hours 02/07/17 1141         Objective: Vitals:   02/08/17 2227 02/09/17 0618 02/09/17 0624 02/09/17 0713  BP:   (!) 175/58 (!) 170/56  Pulse:   93 91  Resp:   20   Temp:   97.9 F (36.6 C)   TempSrc:   Oral   SpO2: 97%  90%   Weight:  101.8 kg (224 lb 6.4 oz)    Height:        Intake/Output Summary (Last 24 hours) at 02/09/2017 1415 Last data filed at 02/09/2017 1342 Gross per 24 hour  Intake -  Output 1850 ml  Net -1850 ml   Filed Weights   02/06/17 0647 02/08/17 0353 02/09/17 0618  Weight: 99.3 kg (219 lb) 102.6 kg (226 lb 3.2 oz) 101.8 kg (224 lb 6.4 oz)    Examination: General exam: Appears comfortable  HEENT: PERRLA, oral mucosa moist, no sclera icterus or thrush Respiratory system: crackles in LLL- Respiratory effort normal. 90% on 2 L today Cardiovascular system: S1 & S2 heard,  No murmurs  Gastrointestinal system: Abdomen soft, non-tender, nondistended. Normal bowel sound. No organomegaly Central nervous system: Alert and oriented. No focal neurological deficits. Extremities: No cyanosis, clubbing or edema Skin: No rashes or ulcers Psychiatry:  Mood & affect appropriate.     Data Reviewed: I have personally reviewed following labs and imaging studies  CBC: Recent Labs  Lab 02/04/17 1308 02/05/17 0635 02/07/17 0819  WBC 10.1 9.2 9.2  NEUTROABS 8.0*  --   --   HGB 9.8* 9.4* 9.0*  HCT 31.3* 30.3* 29.6*  MCV 92.1 92.7 94.0  PLT 225 178 035   Basic Metabolic Panel: Recent Labs  Lab 02/04/17 1308 02/05/17 0635 02/07/17 0819 02/08/17 0752 02/08/17 1029 02/09/17 0427  NA 140 140 137 137  --  139  K 3.4* 4.1 3.5 3.3*  --  3.4*  CL 103 104 102 103  --  100*  CO2 29 26 30 28   --  31  GLUCOSE 132* 118* 97 109*  --  107*  BUN 28* 30* 45* 49*  --  48*  CREATININE 1.64* 1.57*  2.51* 2.91*  --  2.74*  CALCIUM 9.5 9.3 8.9 8.8*  --  9.4  MG  --   --   --   --  1.8 1.8   GFR: Estimated Creatinine Clearance: 22.7 mL/min (A) (by C-G formula based on SCr of 2.74 mg/dL (H)). Liver Function Tests: Recent Labs  Lab 02/04/17 1308 02/05/17 0635  AST 36 36  ALT 37 37  ALKPHOS 68 64  BILITOT 0.6 0.6  PROT 6.6 6.4*  ALBUMIN 3.0* 2.8*   Recent Labs  Lab 02/04/17 1308  LIPASE 110*   No results for input(s): AMMONIA in the last 168 hours. Coagulation Profile: Recent Labs  Lab 02/05/17 0635  INR 1.16  Cardiac Enzymes: No results for input(s): CKTOTAL, CKMB, CKMBINDEX, TROPONINI in the last 168 hours. BNP (last 3 results) Recent Labs    11/27/16 1036  PROBNP 202.0*   HbA1C: No results for input(s): HGBA1C in the last 72 hours. CBG: No results for input(s): GLUCAP in the last 168 hours. Lipid Profile: No results for input(s): CHOL, HDL, LDLCALC, TRIG, CHOLHDL, LDLDIRECT in the last 72 hours. Thyroid Function Tests: No results for input(s): TSH, T4TOTAL, FREET4, T3FREE, THYROIDAB in the last 72 hours. Anemia Panel: No results for input(s): VITAMINB12, FOLATE, FERRITIN, TIBC, IRON, RETICCTPCT in the last 72 hours. Urine analysis:    Component Value Date/Time   COLORURINE STRAW (A) 02/05/2017 2215   APPEARANCEUR CLEAR 02/05/2017 2215   LABSPEC 1.006 02/05/2017 2215   PHURINE 5.0 02/05/2017 2215   GLUCOSEU NEGATIVE 02/05/2017 2215   GLUCOSEU NEGATIVE 12/08/2015 1024   HGBUR NEGATIVE 02/05/2017 2215   BILIRUBINUR NEGATIVE 02/05/2017 2215   BILIRUBINUR neg 01/04/2016 1054   [place] 02/05/2017 2215   PROTEINUR NEGATIVE 02/05/2017 2215   UROBILINOGEN negative 01/04/2016 1054   UROBILINOGEN 0.2 12/08/2015 1024   NITRITE NEGATIVE 02/05/2017 2215   LEUKOCYTESUR NEGATIVE 02/05/2017 2215   Sepsis Labs: @LABRCNTIP (procalcitonin:4,lacticidven:4) )No results found for this or any previous visit (from the past 240 hour(s)).       Radiology  Studies: No results found.    Scheduled Meds: . amLODipine  10 mg Oral Daily  . calcitRIOL  0.5 mcg Oral Daily  . diclofenac sodium  2 g Topical QID  . famotidine  20 mg Oral Daily  . furosemide  40 mg Intravenous Q12H  . gabapentin  300 mg Oral BID  . heparin  5,000 Units Subcutaneous Q8H  . hydrALAZINE  10 mg Oral TID  . labetalol  400 mg Oral BID  . levofloxacin  750 mg Oral Q48H  . mouth rinse  15 mL Mouth Rinse BID  . pantoprazole  40 mg Oral Daily  . spironolactone  50 mg Oral BID   Continuous Infusions:    LOS: 5 days    Time spent in minutes: [place], MD [facility] Pager: [email or web address] Password [email or web address] 02/09/2017, 2:15 PM

## 2017-02-09 NOTE — Progress Notes (Signed)
SATURATION QUALIFICATIONS: (This note is used to comply with regulatory documentation for home oxygen)  Patient Saturations on Room Air at Rest = 96%  Patient Saturations on Room Air while Ambulating = 91%  Patient Saturations on 0 Liters of oxygen while Ambulating = 91%  Please briefly explain why patient needs home oxygen:

## 2017-02-10 DIAGNOSIS — J181 Lobar pneumonia, unspecified organism: Secondary | ICD-10-CM

## 2017-02-10 DIAGNOSIS — J9601 Acute respiratory failure with hypoxia: Secondary | ICD-10-CM

## 2017-02-10 DIAGNOSIS — N179 Acute kidney failure, unspecified: Secondary | ICD-10-CM

## 2017-02-10 LAB — BASIC METABOLIC PANEL
Anion gap: 8 (ref 5–15)
BUN: 47 mg/dL — AB (ref 6–20)
CALCIUM: 9.7 mg/dL (ref 8.9–10.3)
CO2: 30 mmol/L (ref 22–32)
CREATININE: 2.7 mg/dL — AB (ref 0.44–1.00)
Chloride: 102 mmol/L (ref 101–111)
GFR calc Af Amer: 19 mL/min — ABNORMAL LOW (ref >60)
GFR, EST NON AFRICAN AMERICAN: 17 mL/min — AB (ref >60)
GLUCOSE: 86 mg/dL (ref 65–99)
Potassium: 3.8 mmol/L (ref 3.5–5.1)
Sodium: 140 mmol/L (ref 135–145)

## 2017-02-10 LAB — MAGNESIUM: Magnesium: 1.9 mg/dL (ref 1.7–2.4)

## 2017-02-10 MED ORDER — IPRATROPIUM-ALBUTEROL 0.5-2.5 (3) MG/3ML IN SOLN
3.0000 mL | Freq: Four times a day (QID) | RESPIRATORY_TRACT | Status: DC
  Administered 2017-02-10 – 2017-02-11 (×4): 3 mL via RESPIRATORY_TRACT
  Filled 2017-02-10 (×4): qty 3

## 2017-02-10 MED ORDER — FLUTICASONE PROPIONATE 50 MCG/ACT NA SUSP
2.0000 | Freq: Every day | NASAL | Status: DC
  Administered 2017-02-10 – 2017-02-12 (×3): 2 via NASAL
  Filled 2017-02-10: qty 16

## 2017-02-10 NOTE — Care Management Note (Addendum)
Case Management Note  Patient Details  Name: [name] MRN: [id number] Date of Birth: 1945-06-06  Subjective/Objective:    From home, pta indep, she will need home oxygen per Ginger RN, NCM spoke with patient she states she would like to use [facility], referral made to [place] with [facility] for home oxygen.  Per RN, MD states patient will get iv lasix today and plan for dc tomorrow.                Action/Plan: NCM will follow for dc needs.   Expected Discharge Date:                  Expected Discharge Plan:  Home/Self Care  In-House Referral:     Discharge planning Services  CM Consult  Post Acute Care Choice:  Durable Medical Equipment Choice offered to:  Patient  DME Arranged:  Oxygen DME Agency:  [place]:    [place] Agency:     Status of Service:  Completed, signed off  If discussed at [place] of Stay Meetings, dates discussed:    Additional Comments:  Zenon Mayo, RN 02/10/2017, 11:33 AM

## 2017-02-10 NOTE — Care Management Important Message (Signed)
Important Message  Patient Details  Name: [name] MRN: [id number] Date of Birth: 10-29-1945   Medicare Important Message Given:  Yes    Nathen May 02/10/2017, 10:38 AM

## 2017-02-11 DIAGNOSIS — M542 Cervicalgia: Secondary | ICD-10-CM

## 2017-02-11 DIAGNOSIS — I5031 Acute diastolic (congestive) heart failure: Secondary | ICD-10-CM

## 2017-02-11 DIAGNOSIS — G8929 Other chronic pain: Secondary | ICD-10-CM

## 2017-02-11 DIAGNOSIS — I447 Left bundle-branch block, unspecified: Secondary | ICD-10-CM

## 2017-02-11 LAB — BASIC METABOLIC PANEL
Anion gap: 7 (ref 5–15)
BUN: 43 mg/dL — ABNORMAL HIGH (ref 6–20)
CHLORIDE: 100 mmol/L — AB (ref 101–111)
CO2: 33 mmol/L — AB (ref 22–32)
CREATININE: 2.71 mg/dL — AB (ref 0.44–1.00)
Calcium: 10 mg/dL (ref 8.9–10.3)
GFR calc non Af Amer: 17 mL/min — ABNORMAL LOW (ref >60)
GFR, EST AFRICAN AMERICAN: 19 mL/min — AB (ref >60)
Glucose, Bld: 102 mg/dL — ABNORMAL HIGH (ref 65–99)
POTASSIUM: 3.7 mmol/L (ref 3.5–5.1)
Sodium: 140 mmol/L (ref 135–145)

## 2017-02-11 LAB — MAGNESIUM: Magnesium: 1.6 mg/dL — ABNORMAL LOW (ref 1.7–2.4)

## 2017-02-11 MED ORDER — LEVOFLOXACIN 500 MG PO TABS
500.0000 mg | ORAL_TABLET | ORAL | Status: DC
  Administered 2017-02-11: 500 mg via ORAL
  Filled 2017-02-11: qty 1

## 2017-02-11 MED ORDER — PANTOPRAZOLE SODIUM 40 MG PO TBEC: 40.0000 mg | DELAYED_RELEASE_TABLET | Freq: Every day | ORAL | 0 refills | Status: DC

## 2017-02-11 MED ORDER — FLUTICASONE PROPIONATE 50 MCG/ACT NA SUSP: 2.0000 | Freq: Every day | NASAL | 2 refills | Status: DC

## 2017-02-11 MED ORDER — SPIRONOLACTONE 25 MG PO TABS: 50.0000 mg | ORAL_TABLET | Freq: Every day | ORAL | 6 refills | Status: DC

## 2017-02-11 MED ORDER — FAMOTIDINE 20 MG PO TABS: 20.0000 mg | ORAL_TABLET | Freq: Two times a day (BID) | ORAL | 0 refills | Status: DC

## 2017-02-11 MED ORDER — MAGNESIUM SULFATE 4 GM/100ML IV SOLN
4.0000 g | Freq: Once | INTRAVENOUS | Status: AC
  Administered 2017-02-11: 4 g via INTRAVENOUS
  Filled 2017-02-11: qty 100

## 2017-02-11 MED ORDER — CALCIUM CARBONATE ANTACID 500 MG PO CHEW: 1.0000 | CHEWABLE_TABLET | Freq: Every day | ORAL | 3 refills | Status: DC

## 2017-02-11 MED ORDER — IPRATROPIUM-ALBUTEROL 0.5-2.5 (3) MG/3ML IN SOLN
3.0000 mL | Freq: Two times a day (BID) | RESPIRATORY_TRACT | Status: DC
  Administered 2017-02-11 – 2017-02-12 (×2): 3 mL via RESPIRATORY_TRACT
  Filled 2017-02-11 (×2): qty 3

## 2017-02-11 MED ORDER — LEVOFLOXACIN 500 MG PO TABS: 500.0000 mg | ORAL_TABLET | ORAL | 0 refills | Status: DC

## 2017-02-11 NOTE — Discharge Instructions (Signed)
Your Spironolactone has been increased from 25 to 50 mg daily.  Please see your doctor in 1 wk to have the following blood work done> Bmet and Mg+ Avoid spicy, oily food. Limit caffeine. Do not eat at least 1 hr before bedtime. Do not lay down after eating.    Please take all your medications with you for your next visit with your Primary MD. Please request your Primary MD to go over all hospital test results at the follow up. Please ask your Primary MD to get all Hospital records sent to his/her office.  If you experience worsening of your admission symptoms, develop shortness of breath, chest pain, suicidal or homicidal thoughts or a life threatening emergency, you must seek medical attention immediately by calling 911 or calling your MD.  Dennis Bast must read the complete instructions/literature along with all the possible adverse reactions/side effects for all the medicines you take including new medications that have been prescribed to you. Take new medicines after you have completely understood and accpet all the possible adverse reactions/side effects.   Do not drive when taking pain medications or sedatives.    Do not take more than prescribed Pain, Sleep and Anxiety Medications  If you have smoked or chewed Tobacco in the last 2 yrs please stop. Stop any regular alcohol and or recreational drug use.  Wear Seat belts while driving.

## 2017-02-11 NOTE — Progress Notes (Signed)
Pharmacy Antibiotic Note  [name] is a 71 y.o. female admitted on 02/04/2017 with epigastric abdominal pain.  Pharmacy has been consulted for Levaquin dosing for CAP. SCr remains elevated so will need to continue to assess renal function and adjust dose. She is afebrile, WBC are normal, and there is no culture data.  Plan: Adjust Levaquin to 500 mg PO q48h  Monitor renal function, clinical progress, and plan/length of treatment  Height: 5\' 6"  (167.6 cm) Weight: 215 lb 8 oz (97.8 kg) IBW/kg (Calculated) : 59.3  Temp (24hrs), Avg:98.8 F (37.1 C), Min:97.7 F (36.5 C), Max:99.3 F (37.4 C)  Recent Labs  Lab 02/04/17 1308 02/05/17 0635 02/07/17 0819 02/08/17 0752 02/09/17 0427 02/10/17 0805 02/11/17 0541  WBC 10.1 9.2 9.2  --   --   --   --   CREATININE 1.64* 1.57* 2.51* 2.91* 2.74* 2.70* 2.71*    Estimated Creatinine Clearance: 22.5 mL/min (A) (by C-G formula based on SCr of 2.71 mg/dL (H)).    Allergies  Allergen Reactions  . Minoxidil Palpitations and Other (See Comments)    unable to sleep  . Naproxen Swelling     Thank you for allowing [place] to participate in this patients care.  Jens Som, PharmD Clinical phone for 02/11/2017 from 7a-3:30p: [phone] If after 3:30p, please call main pharmacy at: [phone] 02/11/2017 7:33 AM

## 2017-02-11 NOTE — Discharge Summary (Addendum)
Physician Discharge Summary  [name] KPT:[id number] DOB: Aug 17, 1945 DOA: 02/04/2017  PCP: Martha Koch, MD  Admit date: 02/04/2017 Discharge date: 02/11/2017  Admitted From: home Disposition:  home   Recommendations for Outpatient Follow-up:  1. PCP to check renal function/ K in 1 wk please 2. Wean O2 as able 3. F/u on AAA in 3 years  Home Health:  oredered Equipment/Devices:  O2    Discharge Condition:  stable   CODE STATUS:  Full code  Consultations:  none    Discharge Diagnoses:  Principal Problem:   Epigastric abdominal pain Active Problems:   Acute respiratory failure with hypoxia (HCC)   Lobar pneumonia (HCC)   Acute on chronic Diastolic congestive heart failure    AKI (acute kidney injury) (HCC)   Hypokalemia   Chronic midline posterior neck pain   Vitamin D deficiency   OBESITY   Essential hypertension   Venous (peripheral) insufficiency   CKD (chronic kidney disease) stage 3, GFR 30-59 ml/min (HCC)   Anemia in chronic kidney disease   LBBB (left bundle branch block)   Pericardial effusion    Subjective: Cough is improving. She is coughing up white/clear sputum now. Not very dyspneic on exertion. No chest pain. Posterior neck pain is mostly just present in the area where her neck meets her cranium and is not very severe.  Brief Summary: [name] is a 71 y.o.femalewith medical history significant for HTN, HLD, history of moderate aortic regurgitation, history of pericardial effusion since 2018, left bundle branch block, CK B stage III, carotid artery disease, chronic bilateral lower extremity edema, brought via EMS, after developing acute onset of epigastric abdominal pain while getting dressed. She was given aspirin, nitroglycerin 2 and Zofran. No relief of her pain was achieved. Lipase was noted to be elevated at 110 and she was admitted for acute pancreatitis.   Hospital Course:  Principal Problem:   Epigastric abdominal pain  -   She described to me that the pain  feels more like indigestion - it improved with GI cocktail and therefore suspect that this was more a Gastritis/ GERD rather than acute pancreatitis - CT also not suggestive of acute pancreatitis - started Pepcid and Protonix which has been helping- she occasionally has had some intermittent heartburn despite these medications- she has been tolerating food well.   Active Problems:   Hypokalemia - replaced    Fever/ acute respiratory failure- LLL pneumonia - influenza and UA negative- CXR showed mild atelectasis in LLL on 10/31-   I have persistently heard coarse crackles in L mid and lower lobes and I suspect pneumonia - started Levaquin on 11/2- fevers resolved-   - cough subsequently became more productive- added duoneb to help her expectorate more - dyspnea significantly improved  Nasal discharge - Started Flonase   AKI/ CKD 3- acute dCHF - Cr worsening over the past few days- Weight went up- began to diurese with IV Lasix and Spironolactone  50 mg BID - Cr improved only slightly- see trend below- - I have increased home dose of Spironolactone from 25 > 50 mg daily - recommend Bmet in 1 wk - weight 103.6 >> 97.8  Hypomagnesemia - replaced    Essential hypertension - BP improved  - Medrol dose pack may have added to baseline HTN   Posterior Neck pain - has post cervical pain and has been on a Medrol dose pack which finished on 11/1- she has no radiculopathy - CT neck without contrast: Multilevel disc disease  and facet disease, contributing to moderate foraminal narrowing on the left at C5-C6, and mild foraminal narrowing on the right at C3-C4, C4-C5, C5-C6. - Voltaren gel continues to be helping the pain- she is mainly tender at C1/C2 area in the midline - can cont Valium PRN which she was using at home - will order OT  H/o pericardial effusion - noted to appear larger on imaging this admission - ECHO shows it to be unchanged    Mod Aortic and Mod Tricuspid regurg   LBBB (left bundle branch block) - needs outpt f/u     Anemia in chronic kidney disease - stable  Severely ectatic abdominal aorta with mild dilation of the distal abdominal aorta to 3.2 cm - need f/u in 3 yrs     Discharge Instructions  Discharge Instructions    (HEART FAILURE PATIENTS) Call MD:  Anytime you have any of the following symptoms: 1) 3 pound weight gain in 24 hours or 5 pounds in 1 week 2) shortness of breath, with or without a dry hacking cough 3) swelling in the hands, feet or stomach 4) if you have to sleep on extra pillows at night in order to breathe.   Complete by:  As directed      Allergies as of 02/11/2017      Reactions   Minoxidil Palpitations, Other (See Comments)   unable to sleep   Naproxen Swelling      Medication List    STOP taking these medications   methylPREDNISolone 4 MG Tbpk tablet Commonly known as:  MEDROL DOSEPAK   predniSONE 20 MG tablet Commonly known as:  DELTASONE   tiZANidine 4 MG tablet Commonly known as:  ZANAFLEX     TAKE these medications   acetaminophen-codeine 300-30 MG tablet Commonly known as:  TYLENOL #3 Take 1 tablet by mouth every 6 (six) hours as needed for moderate pain.   amLODipine 10 MG tablet Commonly known as:  NORVASC Take 1 tablet (10 mg total) by mouth daily.   calcitRIOL 0.25 MCG capsule Commonly known as:  ROCALTROL Take 0.5 mcg by mouth daily.   calcium carbonate 500 MG chewable tablet Commonly known as:  TUMS - dosed in mg elemental calcium Chew 2 tablets by mouth daily as needed for indigestion or heartburn.   diazepam 2 MG tablet Commonly known as:  VALIUM Take 1 tablet (2 mg total) by mouth every 12 (twelve) hours as needed for muscle spasms.   famotidine 20 MG tablet Commonly known as:  PEPCID Take 1 tablet (20 mg total) 2 (two) times daily by mouth.   fluticasone 50 MCG/ACT nasal spray Commonly known as:  FLONASE Place 2 sprays daily into  both nostrils. Start taking on:  02/12/2017   gabapentin 300 MG capsule Commonly known as:  NEURONTIN TAKE 1 CAPSULE(300 MG) BY MOUTH THREE TIMES DAILY AS NEEDED FOR PAIN   hydrALAZINE 50 MG tablet Commonly known as:  APRESOLINE TAKE 1 TABLET BY MOUTH THREE TIMES DAILY What changed:    how much to take  how to take this  when to take this   hydrocortisone 2.5 % rectal cream Commonly known as:  ANUSOL-HC Place 1 application rectally as needed.   ipratropium 0.03 % nasal spray Commonly known as:  ATROVENT Place 2 sprays into both nostrils 2 (two) times daily. Do not use for more than 5days.   labetalol 200 MG tablet Commonly known as:  NORMODYNE TAKE 2 TABLETS BY MOUTH TWICE DAILY What changed:  how much to take  how to take this  when to take this   levofloxacin 500 MG tablet Commonly known as:  LEVAQUIN Take 1 tablet (500 mg total) every other day by mouth. Start taking on:  02/15/2017   losartan 100 MG tablet Commonly known as:  COZAAR TAKE 1 TABLET BY MOUTH EVERY DAY What changed:    how much to take  how to take this  when to take this   magnesium gluconate 500 MG tablet Commonly known as:  MAGONATE Take 1 tablet by mouth 2 (two) times daily.   ondansetron 4 MG tablet Commonly known as:  ZOFRAN Take 1 tablet (4 mg total) by mouth every 8 (eight) hours as needed for nausea or vomiting.   pantoprazole 40 MG tablet Commonly known as:  PROTONIX Take 1 tablet (40 mg total) daily by mouth. Start taking on:  02/12/2017   PROCRIT 84665 UNIT/ML injection Generic drug:  epoetin alfa Inject 40,000 Units into the skin every 14 (fourteen) days.   spironolactone 25 MG tablet Commonly known as:  ALDACTONE Take 2 tablets (50 mg total) daily by mouth. What changed:  how much to take   SYSTANE BALANCE 0.6 % Soln Generic drug:  Propylene Glycol Place 1 drop into both eyes daily as needed (for dry eyes).   torsemide 20 MG tablet Commonly known as:   DEMADEX TAKE 2 TABLETS BY MOUTH EVERY MORNING What changed:    how much to take  how to take this  when to take this   traMADol 50 MG tablet Commonly known as:  ULTRAM Take 1 tablet (50 mg total) by mouth 3 (three) times daily as needed for severe pain.   triamcinolone ointment 0.5 % Commonly known as:  KENALOG Apply 1 application topically 2 (two) times daily.            Durable Medical Equipment  (From admission, onward)        Start     Ordered   02/11/17 1247  For home use only DME oxygen  Once    Question Answer Comment  Mode or (Route) Nasal cannula   Liters per Minute 2   Frequency Continuous (stationary and portable oxygen unit needed)   Oxygen conserving device Yes   Oxygen delivery system Gas      02/11/17 1246     Follow-up Information    Martha Koch, MD.   Specialty:  Internal Medicine Contact information: [place] [id number] [phone]          Allergies  Allergen Reactions  . Minoxidil Palpitations and Other (See Comments)    unable to sleep  . Naproxen Swelling     Procedures/Studies: 2 D ECHO  2 D ECHO Left ventricle: The cavity size was normal. Wall thickness was increased in a pattern of moderate LVH. Systolic function was normal. The estimated ejection fraction was in the range of 60% to 65%. Wall motion was normal; there were no regional wall motion abnormalities. The study is not technically sufficient to allow evaluation of LV diastolic function. - Aortic valve: Poorly visualized. Mildly calcified leaflets. Very mild aortic stenosis. Moderate regurgitation. Mean gradient (S): 20 mm Hg. Peak gradient (S): 34 mm Hg. Valve area (Vmean): 2.46 cm^2. Regurgitation pressure half-time: 219 ms. - Mitral valve: Calcified annulus. Mildly thickened leaflets . There was mild, posteriorly directed regurgitation. - Left atrium: The atrium was mildly dilated. - Tricuspid valve: There  was moderate regurgitation. - Pulmonary arteries:  PA peak pressure: 65 mm Hg (S). - Inferior vena cava: The vessel was normal in size. The respirophasic diameter changes were in the normal range (>= 50%), consistent with normal central venous pressure. - Pericardium, extracardiac: Small circumferential pericardial effusion. No clear tamponade features.    Ct Abdomen Pelvis Wo Contrast  Result Date: 02/04/2017 CLINICAL DATA:  Epigastric pain, nausea, constipation. EXAM: CT ABDOMEN AND PELVIS WITHOUT CONTRAST TECHNIQUE: Multidetector CT imaging of the abdomen and pelvis was performed following the standard protocol without IV contrast. COMPARISON:  None. FINDINGS: Lower chest: Moderate pericardial effusion with maximum transverse diameter of 15 mm. Enlarged heart. Calcific coronary artery disease. Bibasilar dependent atelectatic changes. Hepatobiliary: No focal liver abnormality is seen. No gallstones, gallbladder wall thickening, or biliary dilatation. Pancreas: Unremarkable. No pancreatic ductal dilatation or surrounding inflammatory changes. Spleen: Normal in size without focal abnormality. Adrenals/Urinary Tract: Adrenal glands are unremarkable. Kidneys are without renal calculi, focal lesion, or hydronephrosis. Large but simple appearing 6.2 cm left renal cyst. Bladder is unremarkable. Stomach/Bowel: Stomach is within normal limits. Appendix appears normal. No evidence of bowel wall thickening, distention, or inflammatory changes. Scattered colonic diverticulosis. Vascular/Lymphatic: Aortic atherosclerosis. Severely ectatic aorta with maximum transverse diameter of the distal abdominal aorta of 3.2 cm. No enlarged abdominal or pelvic lymph nodes. Reproductive: Status post hysterectomy. No adnexal masses. Other: Postsurgical or inflammatory changes in the lower anterior abdominal wall. Fat containing periumbilical anterior abdominal wall hernia. Musculoskeletal: No acute osseous findings. L4-L5  prominent disc osteophyte complex. Mild lower lumbosacral spine posterior facet arthropathy. IMPRESSION: Moderate in size pericardial effusion. Enlarged heart. Coronary artery disease and calcific atherosclerotic disease of the aorta. Severely ectatic abdominal aorta with mild dilation of the distal abdominal aorta to 3.2 cm. Recommend followup by ultrasound in 3 years. This recommendation follows ACR consensus guidelines: White Paper of the ACR Incidental Findings Committee II on Vascular Findings. J Am Coll Radiol 2013; 16:109-604 Postsurgical or inflammatory changes in the lower anterior abdominal wall. Fat containing periumbilical anterior abdominal wall hernia. Simple appearing 6.2 cm left renal cyst. Electronically Signed   By: Fidela Salisbury M.D.   On: 02/04/2017 14:46   X-ray Chest Pa And Lateral  Result Date: 02/05/2017 CLINICAL DATA:  Congestive heart failure short of breath EXAM: CHEST  2 VIEW COMPARISON:  02/04/2017 FINDINGS: Marked cardiac enlargement unchanged. Mild vascular congestion without edema. Mild left lower lobe atelectasis and effusion. IMPRESSION: Cardiac enlargement with pulmonary vascular congestion. Mild left lower lobe atelectasis and small left effusion unchanged. Electronically Signed   By: Franchot Gallo M.D.   On: 02/05/2017 09:15   Dg Chest 2 View  Result Date: 02/04/2017 CLINICAL DATA:  Chest pain. EXAM: CHEST  2 VIEW COMPARISON:  Chest x-ray dated December 16, 2016. FINDINGS: Moderate cardiomegaly, unchanged. Mild pulmonary vascular congestion. No focal consolidation, pleural effusion, or pneumothorax. No acute osseous abnormality. IMPRESSION: Stable moderate cardiomegaly and pulmonary vascular congestion. Electronically Signed   By: Titus Dubin M.D.   On: 02/04/2017 12:52   Mr Cervical Spine Wo Contrast  Result Date: 01/13/2017  [facility] [address] [place], [place] [address] [phone] NEUROIMAGING REPORT STUDY DATE:  01/13/2017 PATIENT NAME: Ellason Segar DOB: 04-26-1945 MRN: [id number] EXAM: MRI of the cervical spine ORDERING CLINICIAN: Sarina Ill M.D. CLINICAL HISTORY: 71 year old woman with neck pain, numbness and arm weakness COMPARISON FILMS: No cervical spine.   Lumbar 08/07/2013 TECHNIQUE: MRI of the cervical spine was obtained utilizing 3 mm sagittal slices from the posterior fossa down to  the T3-4 level with T1, T2 and inversion recovery views. In addition 4 mm axial slices from W8-0 down to T1-2 level were included with T2 and gradient echo views. CONTRAST: None IMAGING SITE: [facility], [place], [place] FINDINGS: :  On sagittal images, the spine is imaged from above the cervicomedullary junction to T2.   The spinal cord is of normal caliber and signal.   The vertebral bodies are normally aligned.   The bone marrow in all of the vertebral bodies is hypointense to muscle on all sequences.    Lumbar bone marrow was normal on the 2015 MRI. The discs and interspaces were further evaluated on axial views from C2 to T1 as follows: C2-C3: There is mild left facet hypertrophy. The neural foramina are widely patent and there is no nerve root compression. C3-C4: There is mild disc bulging and mild hypertrophy and right uncovertebral spurring. There is moderate right and minimal left foraminal narrowing. There is no definite nerve root compression though the degenerative changes encroach upon the right C4 nerve root. C4-C5: There is facet hypertrophy and mild uncovertebral spurring. The neural foramina are mildly narrowed but there is no nerve root compression. C5-C6: There is mild spinal stenosis due to disc bulging, facet hypertrophy and uncovertebral spurring. There is moderate right and mild left foraminal narrowing. Although there is no definite nerve root compression the degenerative changes encroach upon the right C6 nerve roots. C6-C7: There is facet hypertrophy. No significant disc bulging. The  neural foramina are widely patent and there is no nerve root compression. C7-T1: The disc and interspace appear normal. T1-T2: The disc and interspace appear normal.    This MRI of the cervical spine without contrast shows the following: 1.    The bone marrow is hypointense to muscle on all sequences.   This is nonspecific but could be due to bone marrow disorders/myelofibrosis such as myelodysplastic syndromes, leukemia or lymphoma, erythropoietic marrow or sclerotic metastases.  Consider CT scan or referral for bone marrow biopsy. 2.    There are multilevel degenerative changes as detailed above. The most significant findings are at C5-C6 where there is mild spinal stenosis and moderate right foraminal narrowing. There is no definite nerve root compression though the changes encroach upon the right C6 nerve root. Additionally, C3-C4 degenerative changes cause moderate right foraminal narrowing encroaching upon the right C4 nerve root. 3.    The spinal cord appears normal. INTERPRETING PHYSICIAN: Richard A. Felecia Shelling, MD, PhD, FAAN Certified in  Neuroimaging by [employer]       Discharge Exam: Vitals:   02/11/17 0552 02/11/17 0908  BP: (!) 148/46   Pulse: 78   Resp: 19   Temp: 98.9 F (37.2 C)   SpO2: 93% 93%   Vitals:   02/10/17 2127 02/11/17 0147 02/11/17 0552 02/11/17 0908  BP: (!) 141/51  (!) 148/46   Pulse: 84 78 78   Resp: '18 18 19   ' Temp: 99.3 F (37.4 C)  98.9 F (37.2 C)   TempSrc: Oral  Oral   SpO2: 93% 94% 93% 93%  Weight:   97.8 kg (215 lb 8 oz)   Height:        General: Pt is alert, awake, not in acute distress Cardiovascular: RRR, S1/S2 +, no rubs, no gallops Respiratory: crackles in L mid and lower lung fields, no wheezing, no rhonchi Abdominal: Soft, NT, ND, bowel sounds + Extremities: no edema, no cyanosis    The results of  significant diagnostics from this hospitalization (including imaging, microbiology, ancillary and laboratory) are listed  below for reference.     Microbiology: No results found for this or any previous visit (from the past 240 hour(s)).   Labs: BNP (last 3 results) No results for input(s): BNP in the last 8760 hours. Basic Metabolic Panel: Recent Labs  Lab 02/07/17 0819 02/08/17 0752 02/08/17 1029 02/09/17 0427 02/10/17 0805 02/11/17 0541  NA 137 137  --  139 140 140  K 3.5 3.3*  --  3.4* 3.8 3.7  CL 102 103  --  100* 102 100*  CO2 30 28  --  31 30 33*  GLUCOSE 97 109*  --  107* 86 102*  BUN 45* 49*  --  48* 47* 43*  CREATININE 2.51* 2.91*  --  2.74* 2.70* 2.71*  CALCIUM 8.9 8.8*  --  9.4 9.7 10.0  MG  --   --  1.8 1.8 1.9 1.6*   Liver Function Tests: Recent Labs  Lab 02/05/17 0635  AST 36  ALT 37  ALKPHOS 64  BILITOT 0.6  PROT 6.4*  ALBUMIN 2.8*   No results for input(s): LIPASE, AMYLASE in the last 168 hours. No results for input(s): AMMONIA in the last 168 hours. CBC: Recent Labs  Lab 02/05/17 0635 02/07/17 0819  WBC 9.2 9.2  HGB 9.4* 9.0*  HCT 30.3* 29.6*  MCV 92.7 94.0  PLT 178 164   Cardiac Enzymes: No results for input(s): CKTOTAL, CKMB, CKMBINDEX, TROPONINI in the last 168 hours. BNP: Invalid input(s): POCBNP CBG: No results for input(s): GLUCAP in the last 168 hours. D-Dimer No results for input(s): DDIMER in the last 72 hours. Hgb A1c No results for input(s): HGBA1C in the last 72 hours. Lipid Profile No results for input(s): CHOL, HDL, LDLCALC, TRIG, CHOLHDL, LDLDIRECT in the last 72 hours. Thyroid function studies No results for input(s): TSH, T4TOTAL, T3FREE, THYROIDAB in the last 72 hours.  Invalid input(s): FREET3 Anemia work up No results for input(s): VITAMINB12, FOLATE, FERRITIN, TIBC, IRON, RETICCTPCT in the last 72 hours. Urinalysis    Component Value Date/Time   COLORURINE STRAW (A) 02/05/2017 2215   APPEARANCEUR CLEAR 02/05/2017 2215   LABSPEC 1.006 02/05/2017 2215   PHURINE 5.0 02/05/2017 2215   GLUCOSEU NEGATIVE 02/05/2017 2215    GLUCOSEU NEGATIVE 12/08/2015 1024   HGBUR NEGATIVE 02/05/2017 2215   BILIRUBINUR NEGATIVE 02/05/2017 2215   BILIRUBINUR neg 01/04/2016 1054   [place] 02/05/2017 2215   PROTEINUR NEGATIVE 02/05/2017 2215   UROBILINOGEN negative 01/04/2016 1054   UROBILINOGEN 0.2 12/08/2015 1024   NITRITE NEGATIVE 02/05/2017 2215   LEUKOCYTESUR NEGATIVE 02/05/2017 2215   Sepsis Labs Invalid input(s): PROCALCITONIN,  WBC,  LACTICIDVEN Microbiology No results found for this or any previous visit (from the past 240 hour(s)).   Time coordinating discharge: Over 30 minutes  SIGNED:   Debbe Odea, MD  [facility] 02/11/2017, 12:53 PM Pager   If 7PM-7AM, please contact night-coverage [email or web address] Password [email or web address]

## 2017-02-11 NOTE — Care Management Note (Signed)
Case Management Note  Patient Details  Name: [name] MRN: [id number] Date of Birth: 10-13-1945  Subjective/Objective:   From home, pta indep, she will need home oxygen per Ginger RN, NCM spoke with patient she states she would like to use [facility], referral made to [place] with [facility] for home oxygen.  Per RN, MD states patient will get iv lasix today and plan for dc tomorrow.  02/11/17 [name] BSN- MD has orderd HHPT, OT, aide and resp therapy for patient and home oxygen, patient chose [facility], Patient states she does not need OT services but would like PT, aide and resp therapy.  [facility] RN's actually do the resp therapy, referral given to [name] with [facility] for [facility] services and DME home oxygen.                 Action/Plan: NCM will follow for dc needs.   Expected Discharge Date:                  Expected Discharge Plan:  [name]  In-House Referral:     Discharge planning Services  CM Consult  Post Acute Care Choice:  Durable Medical Equipment, Home Health Choice offered to:  Patient  DME Arranged:  Oxygen DME Agency:  [place] Arranged:  PT, Nurse's Aide, RN [facility] Agency:  [place]  Status of Service:  Completed, signed off  If discussed at [place] of Stay Meetings, dates discussed:    Additional Comments:  Martha Mayo, RN 02/11/2017, 4:34 PM

## 2017-02-12 LAB — MAGNESIUM: MAGNESIUM: 2.4 mg/dL (ref 1.7–2.4)

## 2017-02-12 LAB — BASIC METABOLIC PANEL
Anion gap: 6 (ref 5–15)
BUN: 38 mg/dL — AB (ref 6–20)
CALCIUM: 9.7 mg/dL (ref 8.9–10.3)
CHLORIDE: 100 mmol/L — AB (ref 101–111)
CO2: 33 mmol/L — ABNORMAL HIGH (ref 22–32)
CREATININE: 2.63 mg/dL — AB (ref 0.44–1.00)
GFR, EST AFRICAN AMERICAN: 20 mL/min — AB (ref >60)
GFR, EST NON AFRICAN AMERICAN: 17 mL/min — AB (ref >60)
Glucose, Bld: 96 mg/dL (ref 65–99)
Potassium: 4 mmol/L (ref 3.5–5.1)
SODIUM: 139 mmol/L (ref 135–145)

## 2017-02-12 NOTE — Progress Notes (Addendum)
SATURATION QUALIFICATIONS: (This note is used to comply with regulatory documentation for home oxygen)  Patient Saturations on Room Air at Rest = 92%  Patient Saturations on Room Air while Ambulating = 83%  Patient Saturations on 2Liters of oxygen while Ambulating = 91%  Please briefly explain why patient needs home oxygen: Patient de-sats and feels short of breath while ambulating.

## 2017-02-12 NOTE — Progress Notes (Signed)
Patient ok for discharge. Has completed > 5 days of antibiotics. Will d/c antibiotics on d/c as patient would only need one more dose for tomorrow and on exam pt looks great with no cough or tachypnea.  D/c as per plans in d/c summary. Pt agreeable with no new complaints  [name], [name]

## 2017-02-12 NOTE — Progress Notes (Signed)
Patient ambulated in hallway with walker patient o2 sats dropped to the low 80's. Patient stated that she felt short of breath. 02 applied back to patient when back in the room 2l via nasal cannula

## 2017-02-12 NOTE — Care Management Note (Addendum)
Case Management Note Previous Note Created by Tomi Bamberger  Patient Details  Name: [name] MRN: [id number] Date of Birth: Mar 25, 1946  Subjective/Objective:   From home, pta indep, she will need home oxygen per Ginger RN, NCM spoke with patient she states she would like to use [facility], referral made to [place] with [facility] for home oxygen.  Per RN, MD states patient will get iv lasix today and plan for dc tomorrow.  02/11/17 [place] BSN- MD has orderd HHPT, OT, aide and resp therapy for patient and home oxygen, patient chose [facility], Patient states she does not need OT services but would like PT, aide and resp therapy.  [facility] RN's actually do the resp therapy, referral given to Butch Penny with [facility] for [facility] services and DME home oxygen.                 Action/Plan: NCM will follow for dc needs.   Expected Discharge Date:                  Expected Discharge Plan:  [place]  In-House Referral:     Discharge planning Services  CM Consult  Post Acute Care Choice:  Durable Medical Equipment, Home Health Choice offered to:  Patient  DME Arranged:  Oxygen DME Agency:  [place] Arranged:  PT, Nurse's Aide, RN [facility] Agency:  [place]  Status of Service:  Completed, signed off  If discussed at [place] of Stay Meetings, dates discussed:    Additional Comments: 02/12/2017 Pt to discharge home today in care of husband.  CM confirmed with [facility] that oxygen along with [facility] referrals are accepted - agency informed of discharge today [name], RN 02/12/2017, 10:04 AM

## 2017-02-13 DIAGNOSIS — I5030 Unspecified diastolic (congestive) heart failure: Secondary | ICD-10-CM | POA: Diagnosis not present

## 2017-02-14 ENCOUNTER — Telehealth: Payer: Self-pay

## 2017-02-14 NOTE — Telephone Encounter (Signed)
LVM for pt to call back as soon as possible.   RE: pt is on TCM list. dc'ed on 02/12/2017. Admitted after she arrived via EMS, after developing acute onset of epigastric abdominal pain while getting dressed. She was given aspirin, nitroglycerin 2 and Zofran. No relief of her pain was achieved.  To follow up with PCP to check renal function and K+

## 2017-02-19 ENCOUNTER — Other Ambulatory Visit (INDEPENDENT_AMBULATORY_CARE_PROVIDER_SITE_OTHER): Payer: Medicare Other

## 2017-02-19 ENCOUNTER — Encounter: Payer: Self-pay | Admitting: Internal Medicine

## 2017-02-19 ENCOUNTER — Telehealth: Payer: Self-pay | Admitting: Internal Medicine

## 2017-02-19 ENCOUNTER — Ambulatory Visit (INDEPENDENT_AMBULATORY_CARE_PROVIDER_SITE_OTHER): Payer: Medicare Other | Admitting: Internal Medicine

## 2017-02-19 VITALS — BP 150/80 | HR 101 | Temp 98.8°F | Ht 66.0 in | Wt 208.0 lb

## 2017-02-19 DIAGNOSIS — J181 Lobar pneumonia, unspecified organism: Secondary | ICD-10-CM

## 2017-02-19 DIAGNOSIS — N184 Chronic kidney disease, stage 4 (severe): Secondary | ICD-10-CM

## 2017-02-19 DIAGNOSIS — R1013 Epigastric pain: Secondary | ICD-10-CM

## 2017-02-19 DIAGNOSIS — Z8709 Personal history of other diseases of the respiratory system: Secondary | ICD-10-CM | POA: Diagnosis not present

## 2017-02-19 DIAGNOSIS — N179 Acute kidney failure, unspecified: Secondary | ICD-10-CM

## 2017-02-19 DIAGNOSIS — J9601 Acute respiratory failure with hypoxia: Secondary | ICD-10-CM

## 2017-02-19 DIAGNOSIS — E876 Hypokalemia: Secondary | ICD-10-CM | POA: Diagnosis not present

## 2017-02-19 LAB — RENAL FUNCTION PANEL
ALBUMIN: 3.3 g/dL — AB (ref 3.5–5.2)
BUN: 27 mg/dL — ABNORMAL HIGH (ref 6–23)
CHLORIDE: 99 meq/L (ref 96–112)
CO2: 34 mEq/L — ABNORMAL HIGH (ref 19–32)
Calcium: 10.4 mg/dL (ref 8.4–10.5)
Creatinine, Ser: 2.59 mg/dL — ABNORMAL HIGH (ref 0.40–1.20)
GFR: 23.42 mL/min — ABNORMAL LOW (ref >60.00)
Glucose, Bld: 104 mg/dL — ABNORMAL HIGH (ref 70–99)
PHOSPHORUS: 2.5 mg/dL (ref 2.3–4.6)
POTASSIUM: 4.1 meq/L (ref 3.5–5.1)
SODIUM: 141 meq/L (ref 135–145)

## 2017-02-19 NOTE — Patient Instructions (Addendum)
We are checking the blood work today and can stop the oxygen.  Continue to gradually increase the exercise at home to build back muscle.   Continue to add bland foods to the diet to help the stomach do okay.   Bland Diet A bland diet consists of foods that do not have a lot of fat or fiber. Foods without fat or fiber are easier for the body to digest. They are also less likely to irritate your mouth, throat, stomach, and other parts of your gastrointestinal tract. A bland diet is sometimes called a BRAT diet. What is my plan? Your health care provider or dietitian may recommend specific changes to your diet to prevent and treat your symptoms, such as:  Eating small meals often.  Cooking food until it is soft enough to chew easily.  Chewing your food well.  Drinking fluids slowly.  Not eating foods that are very spicy, sour, or fatty.  Not eating citrus fruits, such as oranges and grapefruit.  What do I need to know about this diet?  Eat a variety of foods from the bland diet food list.  Do not follow a bland diet longer than you have to.  Ask your health care provider whether you should take vitamins. What foods can I eat? Grains  Hot cereals, such as cream of wheat. Bread, crackers, or tortillas made from refined white flour. Rice. Vegetables Canned or cooked vegetables. Mashed or boiled potatoes. Fruits Bananas. Applesauce. Other types of cooked or canned fruit with the skin and seeds removed, such as canned peaches or pears. Meats and Other Protein Sources Scrambled eggs. Creamy peanut butter or other nut butters. Lean, well-cooked meats, such as chicken or fish. Tofu. Soups or broths. Dairy Low-fat dairy products, such as milk, cottage cheese, or yogurt. Beverages Water. Herbal tea. Apple juice. Sweets and Desserts Pudding. Custard. Fruit gelatin. Ice cream. Fats and Oils Mild salad dressings. Canola or olive oil. The items listed above may not be a complete list  of allowed foods or beverages. Contact your dietitian for more options. What foods are not recommended? Foods and ingredients that are often not recommended include:  Spicy foods, such as hot sauce or salsa.  Fried foods.  Sour foods, such as pickled or fermented foods.  Raw vegetables or fruits, especially citrus or berries.  Caffeinated drinks.  Alcohol.  Strongly flavored seasonings or condiments.  The items listed above may not be a complete list of foods and beverages that are not allowed. Contact your dietitian for more information. This information is not intended to replace advice given to you by your health care provider. Make sure you discuss any questions you have with your health care provider. Document Released: 07/17/2015 Document Revised: 08/31/2015 Document Reviewed: 04/06/2014 Elsevier Interactive Patient Education  2018 [place] in the Home Falls can cause injuries. They can happen to people of all ages. There are many things you can do to make your home safe and to help prevent falls. What can I do on the outside of my home?  Regularly fix the edges of walkways and driveways and fix any cracks.  Remove anything that might make you trip as you walk through a door, such as a raised step or threshold.  Trim any bushes or trees on the path to your home.  Use bright outdoor lighting.  Clear any walking paths of anything that might make someone trip, such as rocks or tools.  Regularly check to  see if handrails are loose or broken. Make sure that both sides of any steps have handrails.  Any raised decks and porches should have guardrails on the edges.  Have any leaves, snow, or ice cleared regularly.  Use sand or salt on walking paths during winter.  Clean up any spills in your garage right away. This includes oil or grease spills. What can I do in the bathroom?  Use night lights.  Install grab bars by the toilet and in the tub  and shower. Do not use towel bars as grab bars.  Use non-skid mats or decals in the tub or shower.  If you need to sit down in the shower, use a plastic, non-slip stool.  Keep the floor dry. Clean up any water that spills on the floor as soon as it happens.  Remove soap buildup in the tub or shower regularly.  Attach bath mats securely with double-sided non-slip rug tape.  Do not have throw rugs and other things on the floor that can make you trip. What can I do in the bedroom?  Use night lights.  Make sure that you have a light by your bed that is easy to reach.  Do not use any sheets or blankets that are too big for your bed. They should not hang down onto the floor.  Have a firm chair that has side arms. You can use this for support while you get dressed.  Do not have throw rugs and other things on the floor that can make you trip. What can I do in the kitchen?  Clean up any spills right away.  Avoid walking on wet floors.  Keep items that you use a lot in easy-to-reach places.  If you need to reach something above you, use a strong step stool that has a grab bar.  Keep electrical cords out of the way.  Do not use floor polish or wax that makes floors slippery. If you must use wax, use non-skid floor wax.  Do not have throw rugs and other things on the floor that can make you trip. What can I do with my stairs?  Do not leave any items on the stairs.  Make sure that there are handrails on both sides of the stairs and use them. Fix handrails that are broken or loose. Make sure that handrails are as long as the stairways.  Check any carpeting to make sure that it is firmly attached to the stairs. Fix any carpet that is loose or worn.  Avoid having throw rugs at the top or bottom of the stairs. If you do have throw rugs, attach them to the floor with carpet tape.  Make sure that you have a light switch at the top of the stairs and the bottom of the stairs. If you do  not have them, ask someone to add them for you. What else can I do to help prevent falls?  Wear shoes that: ? Do not have high heels. ? Have rubber bottoms. ? Are comfortable and fit you well. ? Are closed at the toe. Do not wear sandals.  If you use a stepladder: ? Make sure that it is fully opened. Do not climb a closed stepladder. ? Make sure that both sides of the stepladder are locked into place. ? Ask someone to hold it for you, if possible.  Clearly mark and make sure that you can see: ? Any grab bars or handrails. ? First and last steps. ?  Where the edge of each step is.  Use tools that help you move around (mobility aids) if they are needed. These include: ? Canes. ? Walkers. ? Scooters. ? Crutches.  Turn on the lights when you go into a dark area. Replace any light bulbs as soon as they burn out.  Set up your furniture so you have a clear path. Avoid moving your furniture around.  If any of your floors are uneven, fix them.  If there are any pets around you, be aware of where they are.  Review your medicines with your doctor. Some medicines can make you feel dizzy. This can increase your chance of falling. Ask your doctor what other things that you can do to help prevent falls. This information is not intended to replace advice given to you by your health care provider. Make sure you discuss any questions you have with your health care provider. Document Released: 01/19/2009 Document Revised: 08/31/2015 Document Reviewed: 04/29/2014 Elsevier Interactive Patient Education  [employer].

## 2017-02-19 NOTE — Assessment & Plan Note (Signed)
Checking renal function panel today.

## 2017-02-19 NOTE — Assessment & Plan Note (Signed)
Resolved at this time and can D/C oxygen due to no decrease in saturation while walking in the office.

## 2017-02-19 NOTE — Progress Notes (Signed)
   Subjective:    Patient ID: [name], female    DOB: [birth year], 71 y.o.   MRN: [id number]  HPI The patient is a 71 YO female coming in for hospital follow up (in for GERD/pancreatitis with pneumonia, started on oxygen for walking, treated with antibiotics and GI cocktail with improvement in stomach pain, tolerating diet well prior to leaving the hospital). She is still having some reduction in appetite and is drinking clear liquids and sodas to help settle her stomach. Breathing is doing well and she does not think she still needs oxygen. She denies chest pains, stomach pain, blood in stool, diarrhea, constipation. She is getting around well at home and denies falls. Did not take meds this morning due to coming here.  PMH, [facility], social history reviewed and updated.   Review of Systems  Constitutional: Positive for activity change, appetite change and fatigue. Negative for chills, fever and unexpected weight change.  HENT: Negative.   Eyes: Negative.   Respiratory: Negative for cough, chest tightness and shortness of breath.   Cardiovascular: Negative for chest pain, palpitations and leg swelling.  Gastrointestinal: Positive for nausea. Negative for abdominal distention, abdominal pain, constipation, diarrhea and vomiting.  Musculoskeletal: Positive for neck pain.  Skin: Negative.   Neurological: Negative.   Psychiatric/Behavioral: Negative.       Objective:   Physical Exam  Constitutional: She is oriented to person, place, and time. She appears well-developed and well-nourished.  HENT:  Head: Normocephalic and atraumatic.  Eyes: EOM are normal.  Neck: Normal range of motion.  Cardiovascular: Normal rate and regular rhythm.  Pulmonary/Chest: Effort normal and breath sounds normal. No respiratory distress. She has no wheezes. She has no rales.  Abdominal: Soft. Bowel sounds are normal. She exhibits no distension. There is no tenderness. There is no rebound.  Musculoskeletal: She  exhibits no edema.  Neurological: She is alert and oriented to person, place, and time. Coordination normal.  Slow gait  Skin: Skin is warm and dry.  Psychiatric: She has a normal mood and affect.   Vitals:   02/19/17 0907 02/19/17 1019  BP: (!) 170/60 (!) 150/80  Pulse: (!) 101   Temp: 98.8 F (37.1 C)   TempSrc: Oral   SpO2: 99%   Weight: 208 lb (94.3 kg)   Height: 5\' 6"  (1.676 m)    Oxygen saturation normal at rest without oxygen and maintained >90% while walking in the office.    Assessment & Plan:

## 2017-02-19 NOTE — Assessment & Plan Note (Signed)
Checking renal panel today to follow for resolution to her acute on CKD.

## 2017-02-19 NOTE — Assessment & Plan Note (Signed)
Pain is resolved and we talked about bland diet to help resume foods. She is mostly doing liquids and bland foods at this time.

## 2017-02-19 NOTE — Telephone Encounter (Signed)
Pt called back stating that she was given a written script for [facility] to be able to pick up the oxygen supplies from her home. She spoke with [facility] and they said that they are not able to take the order over the phone and would need to have it faxed to them. She did not have a fax number. She is not able to bring it back by today.  Todd Phone # [phone]

## 2017-02-19 NOTE — Assessment & Plan Note (Signed)
She finished her antibiotics and breathing is improved, hypoxia resolved. Will need follow up in about 1 month to check for resolution.

## 2017-02-20 NOTE — Telephone Encounter (Signed)
New rx given to Briana to fax so she does not have to bring hers in.

## 2017-02-20 NOTE — Telephone Encounter (Signed)
[facility]ed to [facility] and patient notified

## 2017-02-21 ENCOUNTER — Telehealth: Payer: Self-pay | Admitting: Internal Medicine

## 2017-02-21 MED ORDER — BENZONATATE 100 MG PO CAPS: 100.0000 mg | ORAL_CAPSULE | Freq: Three times a day (TID) | ORAL | 0 refills | Status: DC | PRN

## 2017-02-21 NOTE — Telephone Encounter (Signed)
Sent in [place] 1 pill up to 3 times daily as needed for cough

## 2017-02-21 NOTE — Telephone Encounter (Signed)
Pt called and needs something called in for her cough , states she called all last night, states she just got over pneumonia and was told she still has a little fluid in her lungs  Please advise and call back  She would like something sent in today if possible

## 2017-02-21 NOTE — Telephone Encounter (Signed)
LVM for patient informing them that RX was sent to pharmacy

## 2017-02-26 ENCOUNTER — Inpatient Hospital Stay: Payer: Medicare Other | Admitting: Internal Medicine

## 2017-03-04 NOTE — Progress Notes (Signed)
HEMATOLOGY/ONCOLOGY CONSULTATION NOTE  Date of Service: 03/05/2017  Patient Care Team: Hoyt Koch, MD as PCP - General (Internal Medicine)  CHIEF COMPLAINTS/PURPOSE OF CONSULTATION:  abnormal bone marrow signal on MRI/ chronic anemia   HISTORY OF PRESENTING ILLNESS:   [name] is a wonderful 71 y.o. female who has been referred to [place] by Dr Sharlet Salina, Real Cons, MD for evaluation and management of bone marrow changes incidentally noted on a recent MRI of the spine.   Patient has a h/o multiple medical co-morbidities including CKD Stage 4, CHF, Anemia of chronic disease, obesity who was Having a lot of neck pain and headaches.She was seen by neurology for pain in the neck and got an MRI which showed some abnormality with her bone marrow. MRI c spine showed - The bone marrow is hypointense to muscle on all sequences.   This is nonspecific but could be due to bone marrow disorders/myelofibrosis such as myelodysplastic syndromes, leukemia or lymphoma, erythropoietic marrow or sclerotic metastases.   She has anemia likely associated with her CKD. Her hgb was 9.0 on 02/07/2017. She is on Iron replacement and procrit injections which are likely causing an erythropoetically active marrow. She states she is doing well overall. She states she has been on procrit for about 1 year now and this has been helping her maintain stable blood counts.   On review of systems, pt reports severe neck pain (likely from multilevel DDD), weight loss, intermittent numbness in the feet and denies tingling in hands, fever, chills, night sweats and any other accompanying symptoms.   MEDICAL HISTORY:  Past Medical History:  Diagnosis Date  . Acute pancreatitis 02/04/2017   [name] 02/04/2017  . Adenomatous colon polyp 01/2002  . Anemia, chronic disease    /notes 02/04/2017  . Arthritis    "lower back, knees" (02/04/2017)  . CHF (congestive heart failure) ([place])   . CKD (chronic kidney  disease), stage III ([place])   . Diverticulosis of colon   . DJD (degenerative joint disease)   . Frequent UTI   . Heart murmur   . Hemorrhoids   . Hypertension   . Lumbar back pain   . Neuropathy   . Obesity   . Pericardial effusion     in a patient with Diastolic heart  failure and Pericardial effusion  known since last 2 D echo in 11/2016 /notes 02/04/2017  . PONV (postoperative nausea and vomiting)   . Renal cyst   . Shortness of breath dyspnea    with exertion  . Venous insufficiency   . Vitamin D deficiency     SURGICAL HISTORY: Past Surgical History:  Procedure Laterality Date  . ABDOMINAL HYSTERECTOMY     "partial"  . COLONOSCOPY W/ BIOPSIES AND POLYPECTOMY     "bx was ok"    SOCIAL HISTORY: Social History   Socioeconomic History  . Marital status: Married    Spouse name: Juanda Crumble  . Number of children: 1  . Years of education: 53  . Highest education level: Not on file  Social Needs  . Financial resource strain: Not on file  . Food insecurity - worry: Not on file  . Food insecurity - inability: Not on file  . Transportation needs - medical: Not on file  . Transportation needs - non-medical: Not on file  Occupational History  . Occupation: retired from Administrator, arts  Tobacco Use  . Smoking status: Former Smoker    Packs/day: 0.40    Years: 15.00  Pack years: 6.00    Types: Cigarettes    Last attempt to quit: 04/08/1982    Years since quitting: 34.9  . Smokeless tobacco: Never Used  Substance and Sexual Activity  . Alcohol use: No  . Drug use: No  . Sexual activity: No  Other Topics Concern  . Not on file  Social History Narrative   Lives at home w/ her husband   Right-handed   Daily caffeine     FAMILY HISTORY: Family History  Problem Relation Age of Onset  . Diabetes Mother   . Colon polyps Mother   . Hypertension Mother   . Hypertension Brother   . Hypertension Sister   . Colon cancer Neg Hx     ALLERGIES:  is allergic to minoxidil and  naproxen.  MEDICATIONS:  Current Outpatient Medications  Medication Sig Dispense Refill  . acetaminophen-codeine (TYLENOL #3) 300-30 MG tablet Take 1 tablet by mouth every 6 (six) hours as needed for moderate pain.     Marland Kitchen amLODipine (NORVASC) 10 MG tablet Take 1 tablet (10 mg total) by mouth daily. 30 tablet 0  . benzonatate (TESSALON) 100 MG capsule Take 1 capsule (100 mg total) 3 (three) times daily as needed by mouth for cough. 45 capsule 0  . calcitRIOL (ROCALTROL) 0.25 MCG capsule Take 0.5 mcg by mouth daily.  0  . calcium carbonate (TUMS - DOSED IN MG ELEMENTAL CALCIUM) 500 MG chewable tablet Chew 2 tablets by mouth daily as needed for indigestion or heartburn.    . diazepam (VALIUM) 2 MG tablet Take 1 tablet (2 mg total) by mouth every 12 (twelve) hours as needed for muscle spasms. 14 tablet 0  . famotidine (PEPCID) 20 MG tablet Take 1 tablet (20 mg total) 2 (two) times daily by mouth. 60 tablet 0  . fluticasone (FLONASE) 50 MCG/ACT nasal spray Place 2 sprays daily into both nostrils. 16 g 2  . gabapentin (NEURONTIN) 300 MG capsule TAKE 1 CAPSULE(300 MG) BY MOUTH THREE TIMES DAILY AS NEEDED FOR PAIN 90 capsule 0  . hydrALAZINE (APRESOLINE) 50 MG tablet TAKE 1 TABLET BY MOUTH THREE TIMES DAILY (Patient taking differently: TAKE 1 TABLET 50m  BY MOUTH THREE TIMES DAILY) 270 tablet 0  . hydrocortisone (ANUSOL-HC) 2.5 % rectal cream Place 1 application rectally as needed. 30 g 0  . ipratropium (ATROVENT) 0.03 % nasal spray Place 2 sprays into both nostrils 2 (two) times daily. Do not use for more than 5days. 30 mL 0  . labetalol (NORMODYNE) 200 MG tablet TAKE 2 TABLETS BY MOUTH TWICE DAILY (Patient taking differently: take two tablets 4032mtotal  twice  daily) 120 tablet 0  . losartan (COZAAR) 100 MG tablet TAKE 1 TABLET BY MOUTH EVERY DAY (Patient taking differently: TAKE 1 TABLET  10010mY MOUTH EVERY DAY) 30 tablet 11  . magnesium gluconate (MAGONATE) 500 MG tablet Take 1 tablet by mouth 2  (two) times daily.  0  . ondansetron (ZOFRAN) 4 MG tablet Take 1 tablet (4 mg total) by mouth every 8 (eight) hours as needed for nausea or vomiting. 20 tablet 0  . pantoprazole (PROTONIX) 40 MG tablet Take 1 tablet (40 mg total) daily by mouth. 30 tablet 0  . PROCRIT 40082423IT/ML injection Inject 40,000 Units into the skin every 14 (fourteen) days.  3  . Propylene Glycol (SYSTANE BALANCE) 0.6 % SOLN Place 1 drop into both eyes daily as needed (for dry eyes).    . sMarland Kitchenironolactone (ALDACTONE) 25 MG tablet  Take 2 tablets (50 mg total) daily by mouth. 30 tablet 6  . torsemide (DEMADEX) 20 MG tablet TAKE 2 TABLETS BY MOUTH EVERY MORNING (Patient taking differently: TAKE 2 TABLETS 74m total BY MOUTH EVERY MORNING) 60 tablet 6  . traMADol (ULTRAM) 50 MG tablet Take 1 tablet (50 mg total) by mouth 3 (three) times daily as needed for severe pain. 15 tablet 0  . triamcinolone ointment (KENALOG) 0.5 % Apply 1 application topically 2 (two) times daily. 100 g 3   No current facility-administered medications for this visit.     REVIEW OF SYSTEMS:    10 Point review of Systems was done is negative except as noted above.  PHYSICAL EXAMINATION: ECOG PERFORMANCE STATUS: 1  . Vitals:   03/05/17 1129  BP: (!) 199/59  Pulse: (!) 108  Resp: 17  Temp: 97.6 F (36.4 C)  SpO2: 98%   Filed Weights   03/05/17 1129  Weight: 207 lb 12.8 oz (94.3 kg)   .Body mass index is 33.54 kg/m.  GENERAL:alert, in no acute distress and comfortable SKIN: no acute rashes, no significant lesions EYES: conjunctiva are pink and non-injected, sclera anicteric OROPHARYNX: MMM, no exudates, no oropharyngeal erythema or ulceration NECK: supple, no JVD LYMPH:  no palpable lymphadenopathy in the cervical, axillary or inguinal regions LUNGS: clear to auscultation b/l with normal respiratory effort HEART: regular rate & rhythm ABDOMEN:  normoactive bowel sounds , non tender, not distended. Extremity: no pedal edema PSYCH:  alert & oriented x 3 with fluent speech NEURO: no focal motor/sensory deficits  LABORATORY DATA:  I have reviewed the data as listed  . CBC Latest Ref Rng & Units 03/05/2017 03/05/2017 02/07/2017  WBC 3.9 - 10.3 10e3/uL 6.2 - 9.2  Hemoglobin 11.6 - 15.9 g/dL 9.7(L) - 9.0(L)  Hematocrit 34.0 - 46.6 % 31.3(L) 30.5(L) 29.6(L)  Platelets 145 - 400 10e3/uL 180 - 164   . CBC    Component Value Date/Time   WBC 6.2 03/05/2017 1238   WBC 9.2 02/07/2017 0819   RBC 3.37 (L) 03/05/2017 1238   RBC 3.15 (L) 02/07/2017 0819   HGB 9.7 (L) 03/05/2017 1238   HCT 30.5 (L) 03/05/2017 1238   HCT 31.3 (L) 03/05/2017 1238   PLT 180 03/05/2017 1238   MCV 92.9 03/05/2017 1238   MCH 28.8 03/05/2017 1238   MCH 28.6 02/07/2017 0819   MCHC 31.0 (L) 03/05/2017 1238   MCHC 30.4 02/07/2017 0819   RDW 17.1 (H) 03/05/2017 1238   LYMPHSABS 1.9 03/05/2017 1238   MONOABS 0.5 03/05/2017 1238   EOSABS 0.3 03/05/2017 1238   BASOSABS 0.0 03/05/2017 1238    . CMP Latest Ref Rng & Units 03/05/2017 03/05/2017 02/19/2017  Glucose 70 - 140 mg/dl 97 - 104(H)  BUN 7.0 - 26.0 mg/dL 37.5(H) - 27(H)  Creatinine 0.6 - 1.1 mg/dL 3.1(HH) - 2.59(H)  Sodium 136 - 145 mEq/L 144 - 141  Potassium 3.5 - 5.1 mEq/L 3.9 - 4.1  Chloride 96 - 112 mEq/L - - 99  CO2 22 - 29 mEq/L 26 - 34(H)  Calcium 8.4 - 10.4 mg/dL 10.1 - 10.4  Total Protein 6.0 - 8.5 g/dL 7.3 6.6 -  Total Bilirubin 0.20 - 1.20 mg/dL 0.51 - -  Alkaline Phos 40 - 150 U/L 78 - -  AST 5 - 34 U/L 30 - -  ALT 0 - 55 U/L 26 - -       . Lab Results  Component Value Date   LDH 206  03/05/2017   . Lab Results  Component Value Date   IRON 47 03/05/2017   TIBC 194 (L) 03/05/2017   IRONPCTSAT 24 03/05/2017   (Iron and TIBC)  Lab Results  Component Value Date   FERRITIN 2,017 (H) 03/05/2017   Component     Latest Ref Rng & Units 03/05/2017  Folate, Hemolysate     Not Estab. ng/mL 323.8  HCT     34.0 - 46.6 % 30.5 (L)  Folate, RBC     >498 ng/mL  1,062  Vitamin B12     232 - 1,245 pg/mL 656     RADIOGRAPHIC STUDIES: I have personally reviewed the radiological images as listed and agreed with the findings in the report. Ct Abdomen Pelvis Wo Contrast  Result Date: 02/04/2017 CLINICAL DATA:  Epigastric pain, nausea, constipation. EXAM: CT ABDOMEN AND PELVIS WITHOUT CONTRAST TECHNIQUE: Multidetector CT imaging of the abdomen and pelvis was performed following the standard protocol without IV contrast. COMPARISON:  None. FINDINGS: Lower chest: Moderate pericardial effusion with maximum transverse diameter of 15 mm. Enlarged heart. Calcific coronary artery disease. Bibasilar dependent atelectatic changes. Hepatobiliary: No focal liver abnormality is seen. No gallstones, gallbladder wall thickening, or biliary dilatation. Pancreas: Unremarkable. No pancreatic ductal dilatation or surrounding inflammatory changes. Spleen: Normal in size without focal abnormality. Adrenals/Urinary Tract: Adrenal glands are unremarkable. Kidneys are without renal calculi, focal lesion, or hydronephrosis. Large but simple appearing 6.2 cm left renal cyst. Bladder is unremarkable. Stomach/Bowel: Stomach is within normal limits. Appendix appears normal. No evidence of bowel wall thickening, distention, or inflammatory changes. Scattered colonic diverticulosis. Vascular/Lymphatic: Aortic atherosclerosis. Severely ectatic aorta with maximum transverse diameter of the distal abdominal aorta of 3.2 cm. No enlarged abdominal or pelvic lymph nodes. Reproductive: Status post hysterectomy. No adnexal masses. Other: Postsurgical or inflammatory changes in the lower anterior abdominal wall. Fat containing periumbilical anterior abdominal wall hernia. Musculoskeletal: No acute osseous findings. L4-L5 prominent disc osteophyte complex. Mild lower lumbosacral spine posterior facet arthropathy. IMPRESSION: Moderate in size pericardial effusion. Enlarged heart. Coronary artery disease and  calcific atherosclerotic disease of the aorta. Severely ectatic abdominal aorta with mild dilation of the distal abdominal aorta to 3.2 cm. Recommend followup by ultrasound in 3 years. This recommendation follows ACR consensus guidelines: White Paper of the ACR Incidental Findings Committee II on Vascular Findings. J Am Coll Radiol 2013; 04:599-774 Postsurgical or inflammatory changes in the lower anterior abdominal wall. Fat containing periumbilical anterior abdominal wall hernia. Simple appearing 6.2 cm left renal cyst. Electronically Signed   By: Fidela Salisbury M.D.   On: 02/04/2017 14:46   X-ray Chest Pa And Lateral  Result Date: 02/05/2017 CLINICAL DATA:  Congestive heart failure short of breath EXAM: CHEST  2 VIEW COMPARISON:  02/04/2017 FINDINGS: Marked cardiac enlargement unchanged. Mild vascular congestion without edema. Mild left lower lobe atelectasis and effusion. IMPRESSION: Cardiac enlargement with pulmonary vascular congestion. Mild left lower lobe atelectasis and small left effusion unchanged. Electronically Signed   By: Franchot Gallo M.D.   On: 02/05/2017 09:15   Dg Chest 2 View  Result Date: 02/04/2017 CLINICAL DATA:  Chest pain. EXAM: CHEST  2 VIEW COMPARISON:  Chest x-ray dated December 16, 2016. FINDINGS: Moderate cardiomegaly, unchanged. Mild pulmonary vascular congestion. No focal consolidation, pleural effusion, or pneumothorax. No acute osseous abnormality. IMPRESSION: Stable moderate cardiomegaly and pulmonary vascular congestion. Electronically Signed   By: Titus Dubin M.D.   On: 02/04/2017 12:52    ASSESSMENT & PLAN:  [name] is a  wonderful 71 y.o. female with   #1Abnormal bone marrow signal on MRI  C spine  MR cervical spine wo contrast 01/13/2017 IMPRESSION: 1.The bone marrow is hypointense to muscle on all sequences.This is nonspecific but could be due to bone marrow disorders/myelofibrosis such as myelodysplastic syndromes, leukemia or lymphoma,  erythropoietic marrow or sclerotic metastases. Consider CT scan or referral for bone marrow biopsy. 2.There are multilevel degenerative changes as detailed above. The most significant findings are at C5-C6 where there is mild spinal stenosis and moderate right foraminal narrowing. There is no definite nerve root compression though the changes encroach upon the right C6 nerve root. Additionally, C3-C4 degenerative changes cause moderate right foraminal narrowing encroaching upon the right C4 nerve root. 3. The spinal cord appears normal.  Plan -discussed imaging results and potential etiologies for  bone marrow hypointensity found in MRI-this could be associated with pt CKD and/or procrit shot . -discussed and gave option for bone marrow bx, however due to stable blood labs and obvious factors pt chooses to monitor this which is quite reasonable. -LDH WNL. No other clinical evidence of lymphoproliferative disorder. -smear shows no increased blasts or signs of MPN -myeloma panel and SFLC - WNL -counseled pt to keep track of blood pressure at home since procrit known to raise blood pressure and to consult pcp if this happens.   #2 chronic anemia likely caused by CKD -Pt on procrit shot and ferous sulfate which help maintain stable labs -recommend taking b-complex daily -Recommend continued fu with nephrology for appropriate IV iron replacement and continued EPO treatments  Plan  -Labs today  RTC with Dr Irene Limbo in 4 months with labs   All of the patients questions were answered with apparent satisfaction. The patient knows to call the clinic with any problems, questions or concerns.  I spent 45 minutes counseling the patient face to face. The total time spent in the appointment was 60 minutes and more than 50% was on counseling and direct patient cares.    Sullivan Lone MD MS AAHIVMS [facility] Hematology/Oncology Physician [facility]  (Office):       [phone] (Work cell):   [phone] (Fax):           [phone]  03/05/2017 12:14 PM  This document serves as a record of services personally performed by Sullivan Lone, MD. It was created on his behalf by Alean Rinne, a trained medical scribe. The creation of this record is based on the scribe's personal observations and the provider's statements to them.   .I have reviewed the above documentation for accuracy and completeness, and I agree with the above. Brunetta Genera MD MS

## 2017-03-05 ENCOUNTER — Ambulatory Visit (HOSPITAL_BASED_OUTPATIENT_CLINIC_OR_DEPARTMENT_OTHER): Payer: Medicare Other | Admitting: Hematology

## 2017-03-05 ENCOUNTER — Telehealth: Payer: Self-pay | Admitting: Hematology

## 2017-03-05 ENCOUNTER — Ambulatory Visit (HOSPITAL_BASED_OUTPATIENT_CLINIC_OR_DEPARTMENT_OTHER): Payer: Medicare Other

## 2017-03-05 ENCOUNTER — Encounter: Payer: Self-pay | Admitting: Hematology

## 2017-03-05 ENCOUNTER — Ambulatory Visit: Payer: Medicare Other | Admitting: Cardiology

## 2017-03-05 VITALS — BP 199/59 | HR 108 | Temp 97.6°F | Resp 17 | Ht 66.0 in | Wt 207.8 lb

## 2017-03-05 DIAGNOSIS — N184 Chronic kidney disease, stage 4 (severe): Secondary | ICD-10-CM

## 2017-03-05 DIAGNOSIS — R937 Abnormal findings on diagnostic imaging of other parts of musculoskeletal system: Secondary | ICD-10-CM

## 2017-03-05 DIAGNOSIS — D649 Anemia, unspecified: Secondary | ICD-10-CM | POA: Diagnosis not present

## 2017-03-05 DIAGNOSIS — Z87891 Personal history of nicotine dependence: Secondary | ICD-10-CM | POA: Diagnosis not present

## 2017-03-05 LAB — CBC & DIFF AND RETIC
BASO%: 0.2 % (ref 0.0–2.0)
BASOS ABS: 0 10*3/uL (ref 0.0–0.1)
EOS%: 5.2 % (ref 0.0–7.0)
Eosinophils Absolute: 0.3 10*3/uL (ref 0.0–0.5)
HEMATOCRIT: 31.3 % — AB (ref 34.8–46.6)
HGB: 9.7 g/dL — ABNORMAL LOW (ref 11.6–15.9)
Immature Retic Fract: 13 % — ABNORMAL HIGH (ref 1.60–10.00)
LYMPH%: 30.3 % (ref 14.0–49.7)
MCH: 28.8 pg (ref 25.1–34.0)
MCHC: 31 g/dL — AB (ref 31.5–36.0)
MCV: 92.9 fL (ref 79.5–101.0)
MONO#: 0.5 10*3/uL (ref 0.1–0.9)
MONO%: 8.1 % (ref 0.0–14.0)
NEUT#: 3.5 10*3/uL (ref 1.5–6.5)
NEUT%: 56.2 % (ref 38.4–76.8)
Platelets: 180 10*3/uL (ref 145–400)
RBC: 3.37 10*6/uL — AB (ref 3.70–5.45)
RDW: 17.1 % — AB (ref 11.2–14.5)
RETIC %: 3.13 % — AB (ref 0.70–2.10)
RETIC CT ABS: 105.48 10*3/uL — AB (ref 33.70–90.70)
WBC: 6.2 10*3/uL (ref 3.9–10.3)
lymph#: 1.9 10*3/uL (ref 0.9–3.3)

## 2017-03-05 LAB — COMPREHENSIVE METABOLIC PANEL
ALK PHOS: 78 U/L (ref 40–150)
ALT: 26 U/L (ref 0–55)
ANION GAP: 11 meq/L (ref 3–11)
AST: 30 U/L (ref 5–34)
Albumin: 3.1 g/dL — ABNORMAL LOW (ref 3.5–5.0)
BUN: 37.5 mg/dL — ABNORMAL HIGH (ref 7.0–26.0)
CALCIUM: 10.1 mg/dL (ref 8.4–10.4)
CHLORIDE: 108 meq/L (ref 98–109)
CO2: 26 meq/L (ref 22–29)
Creatinine: 3.1 mg/dL (ref 0.6–1.1)
EGFR: 17 mL/min/{1.73_m2} — ABNORMAL LOW (ref >60)
Glucose: 97 mg/dl (ref 70–140)
POTASSIUM: 3.9 meq/L (ref 3.5–5.1)
Sodium: 144 mEq/L (ref 136–145)
Total Bilirubin: 0.51 mg/dL (ref 0.20–1.20)
Total Protein: 7.3 g/dL (ref 6.4–8.3)

## 2017-03-05 LAB — IRON AND TIBC
%SAT: 24 % (ref 21–57)
IRON: 47 ug/dL (ref 41–142)
TIBC: 194 ug/dL — ABNORMAL LOW (ref 236–444)
UIBC: 147 ug/dL (ref 120–384)

## 2017-03-05 LAB — CHCC SMEAR

## 2017-03-05 LAB — FERRITIN

## 2017-03-05 LAB — LACTATE DEHYDROGENASE: LDH: 206 U/L (ref 125–245)

## 2017-03-05 NOTE — Patient Instructions (Signed)
Thank you for choosing [facility] to provide your oncology and hematology care.  To afford each patient quality time with our providers, please arrive 30 minutes before your scheduled appointment time.  If you arrive late for your appointment, you may be asked to reschedule.  We strive to give you quality time with our providers, and arriving late affects you and other patients whose appointments are after yours.   If you are a no show for multiple scheduled visits, you may be dismissed from the clinic at the providers discretion.    Again, thank you for choosing [facility], our hope is that these requests will decrease the amount of time that you wait before being seen by our physicians.  ______________________________________________________________________  Should you have questions after your visit to the [facility], please contact our office at [phone] between the hours of 8:30 and 4:30 p.m.    Voicemails left after 4:30p.m will not be returned until the following business day.    For prescription refill requests, please have your pharmacy contact us directly.  Please also try to allow 48 hours for prescription requests.    Please contact the scheduling department for questions regarding scheduling.  For scheduling of procedures such as PET scans, CT scans, MRI, Ultrasound, etc please contact central scheduling at [phone].    Resources For Cancer Patients and Caregivers:   Oncolink.org:  A wonderful resource for patients and healthcare providers for information regarding your disease, ways to tract your treatment, what to expect, etc.     American Cancer Society:  800-227-2345  Can help patients locate various types of support and financial assistance  Cancer Care: 1-800-813-HOPE (4673) Provides financial assistance, online support groups, medication/co-pay assistance.    [place] DSS:  336-641-3447 Where to apply for food  stamps, Medicaid, and utility assistance  Medicare Rights Center: 800-333-4114 Helps people with Medicare understand their rights and benefits, navigate the Medicare system, and secure the quality healthcare they deserve  SCAT: 336-333-6589 [place] [employer]'s shared-ride transportation service for eligible riders who have a disability that prevents them from riding the fixed route bus.    For additional information on assistance programs please contact our social worker:   Grier Hock/Abigail Elmore:  [phone]            

## 2017-03-05 NOTE — Telephone Encounter (Signed)
Gave avs and calendar for march 2019 

## 2017-03-06 LAB — VITAMIN B12: VITAMIN B 12: 656 pg/mL (ref 232–1245)

## 2017-03-06 LAB — FOLATE RBC
Folate, Hemolysate: 323.8 ng/mL
Folate, RBC: 1062 ng/mL (ref >498)
HEMATOCRIT: 30.5 % — AB (ref 34.0–46.6)

## 2017-03-06 LAB — KAPPA/LAMBDA LIGHT CHAINS
IG KAPPA FREE LIGHT CHAIN: 121.6 mg/L — AB (ref 3.3–19.4)
IG LAMBDA FREE LIGHT CHAIN: 73.8 mg/L — AB (ref 5.7–26.3)
KAPPA/LAMBDA FLC RATIO: 1.65 (ref 0.26–1.65)

## 2017-03-07 LAB — MULTIPLE MYELOMA PANEL, SERUM
ALBUMIN/GLOB SERPL: 0.8 (ref 0.7–1.7)
Albumin SerPl Elph-Mcnc: 2.9 g/dL (ref 2.9–4.4)
Alpha 1: 0.3 g/dL (ref 0.0–0.4)
Alpha2 Glob SerPl Elph-Mcnc: 0.9 g/dL (ref 0.4–1.0)
B-Globulin SerPl Elph-Mcnc: 1 g/dL (ref 0.7–1.3)
Gamma Glob SerPl Elph-Mcnc: 1.6 g/dL (ref 0.4–1.8)
Globulin, Total: 3.7 g/dL (ref 2.2–3.9)
IGA/IMMUNOGLOBULIN A, SERUM: 318 mg/dL (ref 64–422)
IGM (IMMUNOGLOBIN M), SRM: 108 mg/dL (ref 26–217)
Total Protein: 6.6 g/dL (ref 6.0–8.5)

## 2017-03-09 NOTE — Progress Notes (Signed)
Cardiology Office Note   Date:  03/10/2017   ID:  [name], DOB 1945-08-26, MRN [id number]  PCP:  Hoyt Koch, MD  Cardiologist:   Minus Breeding, MD   Chief Complaint  Patient presents with  . Shortness of Breath      History of Present Illness: [name] is a 71 y.o. female who presents for evaluation of an abnormal EKG.  She has a LBBB. She did have an echocardiogram in 2014 which was essentially normal and she had a negative Lexiscan Myoview.   I saw her in May of this year for evaluation of a murmur.   I sent her for an echo and she had moderate AI.  She recently presented with dyspnea.  I reviewed the CXR results and she did not have edema.  However, her BNP was slightly elevated.  She was referred to see me.   I sent her for repeat echo and there was a small pericardial effusion but no evidence of tamponade and her AI was moderate with very mild stenosis and normal LV size and function.   Since I last saw her she was seen by Hematology for possible bone marrow disorder but it was not clear that she has a primary bone marrow issue and she is being followed for her anemia.  I reviewed these records for this visit.    She was in the hospital in late October and early November for 7 days.  I reviewed these records as well.  She had some abdominal pain which was not of any clear etiology.  She was treated for an early pneumonia.  There was some mention of diastolic dysfunction.  She had a repeat echo which did not suggest any change in the small pericardial effusion.  She is been slowly getting over this.  She is lost about 30 pounds during and since that admission.  She still has some cough.  She still has some shortness of breath which is unchanged.  She is not describing PND or orthopnea.  She is not having palpitations, presyncope or syncope.  She denies any chest pain.    She reports that she has been coughing for a couple of weeks.  She has felt like her head was  stopped up.  She had no productive sputum.  She feels like she gets into coughing fits and she gets SOB with this.  She feels choked.  She has chronic lower extremity edema and this has not been worse.  She has some trouble lying flat.  She has not had fevers or chills.  She was given extra Torsemide for 3 days without improvement.  She has not had chest pain, neck or arm pain.  She has not had any symptoms consistent with reflux.     Past Medical History:  Diagnosis Date  . Acute pancreatitis 02/04/2017   [name] 02/04/2017  . Adenomatous colon polyp 01/2002  . Anemia, chronic disease    /notes 02/04/2017  . Aortic valve disease    AI/AS  . Arthritis    "lower back, knees" (02/04/2017)  . CHF (congestive heart failure) ([place])   . CKD (chronic kidney disease), stage III ([place])   . Diverticulosis of colon   . DJD (degenerative joint disease)   . Frequent UTI   . Hemorrhoids   . Hypertension   . Lumbar back pain   . Neuropathy   . Obesity   . Pericardial effusion     in a patient with  Diastolic heart  failure and Pericardial effusion  known since last 2 D echo in 11/2016 /notes 02/04/2017  . PONV (postoperative nausea and vomiting)   . Pulmonary HTN ([place])   . Renal cyst   . Venous insufficiency   . Vitamin D deficiency     Past Surgical History:  Procedure Laterality Date  . ABDOMINAL HYSTERECTOMY     "partial"  . COLONOSCOPY W/ BIOPSIES AND POLYPECTOMY     "bx was ok"     Current Outpatient Medications  Medication Sig Dispense Refill  . acetaminophen-codeine (TYLENOL #3) 300-30 MG tablet Take 1 tablet by mouth every 6 (six) hours as needed for moderate pain.     Marland Kitchen amLODipine (NORVASC) 10 MG tablet Take 1 tablet (10 mg total) by mouth daily. 30 tablet 0  . benzonatate (TESSALON) 100 MG capsule Take 1 capsule (100 mg total) 3 (three) times daily as needed by mouth for cough. 45 capsule 0  . calcitRIOL (ROCALTROL) 0.25 MCG capsule Take 0.5 mcg by mouth daily.  0  . calcium  carbonate (TUMS - DOSED IN MG ELEMENTAL CALCIUM) 500 MG chewable tablet Chew 2 tablets by mouth daily as needed for indigestion or heartburn.    . diazepam (VALIUM) 2 MG tablet Take 1 tablet (2 mg total) by mouth every 12 (twelve) hours as needed for muscle spasms. 14 tablet 0  . famotidine (PEPCID) 20 MG tablet Take 1 tablet (20 mg total) 2 (two) times daily by mouth. 60 tablet 0  . fluticasone (FLONASE) 50 MCG/ACT nasal spray Place 2 sprays daily into both nostrils. 16 g 2  . gabapentin (NEURONTIN) 300 MG capsule TAKE 1 CAPSULE(300 MG) BY MOUTH THREE TIMES DAILY AS NEEDED FOR PAIN 90 capsule 0  . hydrALAZINE (APRESOLINE) 50 MG tablet TAKE 1 TABLET BY MOUTH THREE TIMES DAILY (Patient taking differently: TAKE 1 TABLET 50mg   BY MOUTH THREE TIMES DAILY) 270 tablet 0  . hydrocortisone (ANUSOL-HC) 2.5 % rectal cream Place 1 application rectally as needed. 30 g 0  . ipratropium (ATROVENT) 0.03 % nasal spray Place 2 sprays into both nostrils 2 (two) times daily. Do not use for more than 5days. 30 mL 0  . labetalol (NORMODYNE) 200 MG tablet TAKE 2 TABLETS BY MOUTH TWICE DAILY (Patient taking differently: take two tablets 400mg  total  twice  daily) 120 tablet 0  . losartan (COZAAR) 100 MG tablet TAKE 1 TABLET BY MOUTH EVERY DAY (Patient taking differently: TAKE 1 TABLET  100mg  BY MOUTH EVERY DAY) 30 tablet 11  . magnesium gluconate (MAGONATE) 500 MG tablet Take 1 tablet by mouth 2 (two) times daily.  0  . ondansetron (ZOFRAN) 4 MG tablet Take 1 tablet (4 mg total) by mouth every 8 (eight) hours as needed for nausea or vomiting. 20 tablet 0  . pantoprazole (PROTONIX) 40 MG tablet Take 1 tablet (40 mg total) daily by mouth. 30 tablet 0  . PROCRIT 08657 UNIT/ML injection Inject 40,000 Units into the skin every 14 (fourteen) days.  3  . Propylene Glycol (SYSTANE BALANCE) 0.6 % SOLN Place 1 drop into both eyes daily as needed (for dry eyes).    Marland Kitchen spironolactone (ALDACTONE) 25 MG tablet Take 2 tablets (50 mg total)  daily by mouth. 30 tablet 6  . torsemide (DEMADEX) 20 MG tablet TAKE 2 TABLETS BY MOUTH EVERY MORNING (Patient taking differently: TAKE 2 TABLETS 40mg  total BY MOUTH EVERY MORNING) 60 tablet 6  . traMADol (ULTRAM) 50 MG tablet Take 1 tablet (50 mg  total) by mouth 3 (three) times daily as needed for severe pain. 15 tablet 0  . triamcinolone ointment (KENALOG) 0.5 % Apply 1 application topically 2 (two) times daily. 100 g 3   No current facility-administered medications for this visit.     Allergies:   Minoxidil and Naproxen    ROS:  Please see the history of present illness.   Otherwise, review of systems are none.   All other systems are reviewed and negative.    PHYSICAL EXAM: VS:  BP (!) 200/60   Pulse (!) 110   Ht 5\' 6"  (1.676 m)   Wt 205 lb (93 kg)   SpO2 96%   BMI 33.09 kg/m  , BMI Body mass index is 33.09 kg/m.  GENERAL:  Well appearing NECK:  No jugular venous distention, waveform within normal limits, carotid upstroke brisk and symmetric, no bruits, no thyromegaly LUNGS:  Clear to auscultation bilaterally CHEST:  Unremarkable HEART:  PMI not displaced or sustained,S1 and S2 within normal limits, no S3, no S4, no clicks, no rubs, 2 out of 6 apical systolic murmur radiating slightly at the aortic outflow tract, 2 out of 6 apical diastolic murmurs ABD:  Flat, positive bowel sounds normal in frequency in pitch, no bruits, no rebound, no guarding, no midline pulsatile mass, no hepatomegaly, no splenomegaly EXT:  2 plus pulses throughout, mild/mod edema, no cyanosis no clubbing   EKG:  EKG is not ordered today.    Recent Labs: 11/27/2016: Pro B Natriuretic peptide (BNP) 202.0 02/12/2017: Magnesium 2.4 03/05/2017: ALT 26; BUN 37.5; Creatinine 3.1; HGB 9.7; Platelets 180; Potassium 3.9; Sodium 144    Lipid Panel    Component Value Date/Time   CHOL 141 02/04/2017 2101   TRIG 78 02/04/2017 2101   HDL 49 02/04/2017 2101   CHOLHDL 2.9 02/04/2017 2101   VLDL 16 02/04/2017  2101   [place] 76 02/04/2017 2101      Wt Readings from Last 3 Encounters:  03/10/17 205 lb (93 kg)  03/05/17 207 lb 12.8 oz (94.3 kg)  02/19/17 208 lb (94.3 kg)      Other studies Reviewed: Additional studies/ records that were reviewed today include:   CXR, labs Review of the above records demonstrates:    ASSESSMENT AND PLAN:  DYSPNEA:    This seems to be at baseline.  She has some chronic lung issues.  This is not related to the pericardial effusion.  There may be some component of diastolic dysfunction.  This may be to some degree be related to her aortic valve disease but at this point this is not severe.  She has some elevated pulmonary pressures.   For now she will continue with her chronic lung therapy.  I am going to work on better blood pressure control.  She might eventually need right and left heart catheterization to sort out her elevated pulmonary pressures and valve.  I will follow her closely with a 1 month follow-up to decide on the timing of any further studies.  AI: This will be evaluated as above.atically.Marland Kitchen   CAROTID STENOSIS:    She had right 40% stenosis last year and she will have a carotid Doppler next year.   HTN:   Her blood pressures is elevated.  I repeated it and it was 937 systolic.  She had not taken some of her AM labs today.   I am going to have her wear a 24-hour blood pressure monitor.   CKD:  Creatinine was slightly up at  3.1.  She is followed by Dr. Detterding.      Current medicines are reviewed at length with the patient today.  The patient does not have concerns regarding medicines.  The following changes have been made:   None  Labs/ tests ordered today include:    Orders Placed This Encounter  Procedures  . HOLTER MONITOR - 24 HOUR     Disposition:   FU with me in one month.    Signed, Minus Breeding, MD  03/10/2017 9:53 AM    [facility]

## 2017-03-10 ENCOUNTER — Ambulatory Visit: Payer: Medicare Other | Admitting: Cardiology

## 2017-03-10 ENCOUNTER — Encounter: Payer: Self-pay | Admitting: Cardiology

## 2017-03-10 VITALS — BP 200/60 | HR 110 | Ht 66.0 in | Wt 205.0 lb

## 2017-03-10 DIAGNOSIS — I272 Pulmonary hypertension, unspecified: Secondary | ICD-10-CM

## 2017-03-10 DIAGNOSIS — I10 Essential (primary) hypertension: Secondary | ICD-10-CM

## 2017-03-10 DIAGNOSIS — I351 Nonrheumatic aortic (valve) insufficiency: Secondary | ICD-10-CM | POA: Diagnosis not present

## 2017-03-10 NOTE — Patient Instructions (Signed)
Medication Instructions:  Continue current medications  If you need a refill on your cardiac medications before your next appointment, please call your pharmacy.  Labwork: None Ordered   Testing/Procedures: Your physician has requested that you have a 24 hour blood pressure monitor. This is put on at our [employer].   Follow-Up: Your physician wants you to follow-up in: 1 Month.    Thank you for choosing [facility] at [facility]!!

## 2017-03-20 ENCOUNTER — Ambulatory Visit: Payer: Medicare Other | Admitting: Physician Assistant

## 2017-03-26 DIAGNOSIS — N2581 Secondary hyperparathyroidism of renal origin: Secondary | ICD-10-CM | POA: Diagnosis not present

## 2017-03-26 DIAGNOSIS — E8770 Fluid overload, unspecified: Secondary | ICD-10-CM | POA: Diagnosis not present

## 2017-03-26 DIAGNOSIS — D631 Anemia in chronic kidney disease: Secondary | ICD-10-CM | POA: Diagnosis not present

## 2017-03-26 DIAGNOSIS — Z7689 Persons encountering health services in other specified circumstances: Secondary | ICD-10-CM | POA: Diagnosis not present

## 2017-03-26 DIAGNOSIS — N184 Chronic kidney disease, stage 4 (severe): Secondary | ICD-10-CM | POA: Diagnosis not present

## 2017-03-26 DIAGNOSIS — I129 Hypertensive chronic kidney disease with stage 1 through stage 4 chronic kidney disease, or unspecified chronic kidney disease: Secondary | ICD-10-CM | POA: Diagnosis not present

## 2017-03-26 LAB — HEPATIC FUNCTION PANEL
ALK PHOS: 76 (ref 25–125)
ALT: 25 (ref 7–35)
AST: 36 — AB (ref 13–35)
BILIRUBIN, TOTAL: 0.3

## 2017-03-26 LAB — BASIC METABOLIC PANEL
BUN: 35 — AB (ref 4–21)
Creatinine: 1.9 — AB (ref 0.5–1.1)
Glucose: 110
Potassium: 3.7 (ref 3.4–5.3)
SODIUM: 145 (ref 137–147)

## 2017-03-28 ENCOUNTER — Encounter: Payer: Self-pay | Admitting: Internal Medicine

## 2017-04-10 DIAGNOSIS — D631 Anemia in chronic kidney disease: Secondary | ICD-10-CM | POA: Diagnosis not present

## 2017-04-10 DIAGNOSIS — I129 Hypertensive chronic kidney disease with stage 1 through stage 4 chronic kidney disease, or unspecified chronic kidney disease: Secondary | ICD-10-CM | POA: Diagnosis not present

## 2017-04-10 DIAGNOSIS — N184 Chronic kidney disease, stage 4 (severe): Secondary | ICD-10-CM | POA: Diagnosis not present

## 2017-04-10 DIAGNOSIS — E8770 Fluid overload, unspecified: Secondary | ICD-10-CM | POA: Diagnosis not present

## 2017-04-10 DIAGNOSIS — N2581 Secondary hyperparathyroidism of renal origin: Secondary | ICD-10-CM | POA: Diagnosis not present

## 2017-04-10 DIAGNOSIS — Z7689 Persons encountering health services in other specified circumstances: Secondary | ICD-10-CM | POA: Diagnosis not present

## 2017-04-15 ENCOUNTER — Ambulatory Visit (INDEPENDENT_AMBULATORY_CARE_PROVIDER_SITE_OTHER): Payer: Medicare Other

## 2017-04-15 ENCOUNTER — Encounter: Payer: Self-pay | Admitting: *Deleted

## 2017-04-15 DIAGNOSIS — I10 Essential (primary) hypertension: Secondary | ICD-10-CM

## 2017-04-15 NOTE — Progress Notes (Signed)
Patient ID: [name], female   DOB: 12/27/1945, 72 y.o.   MRN: [id number] 24 hour ambulatory blood pressure monitor applied using standard adult cuff.

## 2017-04-16 ENCOUNTER — Telehealth: Payer: Self-pay | Admitting: Internal Medicine

## 2017-04-16 MED ORDER — BENZONATATE 100 MG PO CAPS: 100.0000 mg | ORAL_CAPSULE | Freq: Three times a day (TID) | ORAL | 0 refills | Status: DC | PRN

## 2017-04-16 NOTE — Telephone Encounter (Signed)
Should have visit, this was 2 months ago and needs re-evaluation for appropriate treatment.

## 2017-04-16 NOTE — Telephone Encounter (Signed)
Okay to refill? 

## 2017-04-16 NOTE — Telephone Encounter (Signed)
Notified pt MD ok refill has been sent to [facility].Marland KitchenJohny Chess

## 2017-04-16 NOTE — Telephone Encounter (Signed)
Copied from [place] [id number]. Topic: Quick Communication - Rx Refill/Question >> Apr 16, 2017  8:36 AM Lolita Rieger, RMA wrote: Has the patient contacted their pharmacy? no   (Agent: If no, request that the patient contact the pharmacy for the refill.) pt would like a refill on tessalon pearles   Preferred Pharmacy (with phone number or street name):[facility] on [address]   Agent: Please be advised that RX refills may take up to 3 business days. We ask that you follow-up with your pharmacy.

## 2017-04-16 NOTE — Telephone Encounter (Signed)
Called pt back she states she did not need a refill for the tessalon pearles. She states she inform nurse that she still have the cough from when she had pneumonia. She have some chest congestion and not able to spit up. The little phlegm that she did get up was thick and white. She also states sometimes when she cough too much she get SOB. She is wanting to know will MD send her in an antibiotic, or something to help loosen up the congestion in her chest.../lmb

## 2017-04-16 NOTE — Telephone Encounter (Signed)
Copied from [place] [id number]. Topic: General - Other >> Apr 16, 2017  2:13 PM Lolita Rieger, [place] wrote: Reason for CRM:pt would like a call concerning medication that was sent in and her symptoms Please call [phone]

## 2017-04-17 ENCOUNTER — Encounter (HOSPITAL_COMMUNITY): Payer: Medicare Other

## 2017-04-17 NOTE — Telephone Encounter (Signed)
Called pt no answer LMOM w/MD response../lmb 

## 2017-04-19 ENCOUNTER — Observation Stay (HOSPITAL_COMMUNITY)
Admission: EM | Admit: 2017-04-19 | Discharge: 2017-04-20 | Disposition: A | Discharge status: Home / Self Care | Payer: Medicare Other | Attending: Family Medicine | Admitting: Family Medicine

## 2017-04-19 ENCOUNTER — Other Ambulatory Visit: Payer: Self-pay

## 2017-04-19 ENCOUNTER — Emergency Department (HOSPITAL_COMMUNITY): Payer: Medicare Other

## 2017-04-19 ENCOUNTER — Encounter (HOSPITAL_COMMUNITY): Payer: Self-pay | Admitting: Emergency Medicine

## 2017-04-19 DIAGNOSIS — M47816 Spondylosis without myelopathy or radiculopathy, lumbar region: Secondary | ICD-10-CM | POA: Diagnosis not present

## 2017-04-19 DIAGNOSIS — I472 Ventricular tachycardia: Secondary | ICD-10-CM | POA: Diagnosis not present

## 2017-04-19 DIAGNOSIS — E559 Vitamin D deficiency, unspecified: Secondary | ICD-10-CM | POA: Diagnosis not present

## 2017-04-19 DIAGNOSIS — Z79899 Other long term (current) drug therapy: Secondary | ICD-10-CM | POA: Diagnosis not present

## 2017-04-19 DIAGNOSIS — I5033 Acute on chronic diastolic (congestive) heart failure: Secondary | ICD-10-CM | POA: Diagnosis present

## 2017-04-19 DIAGNOSIS — E669 Obesity, unspecified: Secondary | ICD-10-CM | POA: Insufficient documentation

## 2017-04-19 DIAGNOSIS — I110 Hypertensive heart disease with heart failure: Secondary | ICD-10-CM | POA: Diagnosis not present

## 2017-04-19 DIAGNOSIS — Z886 Allergy status to analgesic agent status: Secondary | ICD-10-CM | POA: Diagnosis not present

## 2017-04-19 DIAGNOSIS — Z6832 Body mass index (BMI) 32.0-32.9, adult: Secondary | ICD-10-CM | POA: Diagnosis not present

## 2017-04-19 DIAGNOSIS — I272 Pulmonary hypertension, unspecified: Secondary | ICD-10-CM | POA: Insufficient documentation

## 2017-04-19 DIAGNOSIS — Z888 Allergy status to other drugs, medicaments and biological substances status: Secondary | ICD-10-CM | POA: Diagnosis not present

## 2017-04-19 DIAGNOSIS — Z87891 Personal history of nicotine dependence: Secondary | ICD-10-CM | POA: Diagnosis not present

## 2017-04-19 DIAGNOSIS — I160 Hypertensive urgency: Principal | ICD-10-CM | POA: Diagnosis present

## 2017-04-19 DIAGNOSIS — Z8719 Personal history of other diseases of the digestive system: Secondary | ICD-10-CM | POA: Insufficient documentation

## 2017-04-19 DIAGNOSIS — I5021 Acute systolic (congestive) heart failure: Secondary | ICD-10-CM | POA: Diagnosis not present

## 2017-04-19 DIAGNOSIS — I872 Venous insufficiency (chronic) (peripheral): Secondary | ICD-10-CM | POA: Diagnosis not present

## 2017-04-19 DIAGNOSIS — M170 Bilateral primary osteoarthritis of knee: Secondary | ICD-10-CM | POA: Insufficient documentation

## 2017-04-19 DIAGNOSIS — I447 Left bundle-branch block, unspecified: Secondary | ICD-10-CM | POA: Insufficient documentation

## 2017-04-19 DIAGNOSIS — N184 Chronic kidney disease, stage 4 (severe): Secondary | ICD-10-CM | POA: Diagnosis not present

## 2017-04-19 DIAGNOSIS — I313 Pericardial effusion (noninflammatory): Secondary | ICD-10-CM | POA: Diagnosis not present

## 2017-04-19 DIAGNOSIS — I5032 Chronic diastolic (congestive) heart failure: Secondary | ICD-10-CM | POA: Diagnosis present

## 2017-04-19 DIAGNOSIS — I080 Rheumatic disorders of both mitral and aortic valves: Secondary | ICD-10-CM | POA: Diagnosis not present

## 2017-04-19 DIAGNOSIS — N179 Acute kidney failure, unspecified: Secondary | ICD-10-CM | POA: Diagnosis present

## 2017-04-19 DIAGNOSIS — R05 Cough: Secondary | ICD-10-CM | POA: Diagnosis not present

## 2017-04-19 DIAGNOSIS — Z87440 Personal history of urinary (tract) infections: Secondary | ICD-10-CM | POA: Diagnosis not present

## 2017-04-19 DIAGNOSIS — I130 Hypertensive heart and chronic kidney disease with heart failure and stage 1 through stage 4 chronic kidney disease, or unspecified chronic kidney disease: Secondary | ICD-10-CM | POA: Diagnosis not present

## 2017-04-19 LAB — CBC
HCT: 31.6 % — ABNORMAL LOW (ref 36.0–46.0)
Hemoglobin: 10 g/dL — ABNORMAL LOW (ref 12.0–15.0)
MCH: 29.9 pg (ref 26.0–34.0)
MCHC: 31.6 g/dL (ref 30.0–36.0)
MCV: 94.6 fL (ref 78.0–100.0)
PLATELETS: 199 10*3/uL (ref 150–400)
RBC: 3.34 MIL/uL — AB (ref 3.87–5.11)
RDW: 17.5 % — ABNORMAL HIGH (ref 11.5–15.5)
WBC: 5.8 10*3/uL (ref 4.0–10.5)

## 2017-04-19 LAB — BASIC METABOLIC PANEL
Anion gap: 7 (ref 5–15)
BUN: 22 mg/dL — ABNORMAL HIGH (ref 6–20)
CALCIUM: 9.7 mg/dL (ref 8.9–10.3)
CO2: 28 mmol/L (ref 22–32)
CREATININE: 2.06 mg/dL — AB (ref 0.44–1.00)
Chloride: 107 mmol/L (ref 101–111)
GFR calc non Af Amer: 23 mL/min — ABNORMAL LOW (ref >60)
GFR, EST AFRICAN AMERICAN: 27 mL/min — AB (ref >60)
Glucose, Bld: 109 mg/dL — ABNORMAL HIGH (ref 65–99)
Potassium: 3.7 mmol/L (ref 3.5–5.1)
SODIUM: 142 mmol/L (ref 135–145)

## 2017-04-19 LAB — I-STAT TROPONIN, ED: TROPONIN I, POC: 0.1 ng/mL — AB (ref 0.00–0.08)

## 2017-04-19 LAB — D-DIMER, QUANTITATIVE: D-Dimer, Quant: 0.9 ug/mL-FEU — ABNORMAL HIGH (ref 0.00–0.50)

## 2017-04-19 LAB — BRAIN NATRIURETIC PEPTIDE: B NATRIURETIC PEPTIDE 5: 766.4 pg/mL — AB (ref 0.0–100.0)

## 2017-04-19 MED ORDER — LABETALOL HCL 200 MG PO TABS
400.0000 mg | ORAL_TABLET | Freq: Two times a day (BID) | ORAL | Status: DC
  Administered 2017-04-19 – 2017-04-20 (×2): 400 mg via ORAL
  Filled 2017-04-19 (×2): qty 2

## 2017-04-19 MED ORDER — AMLODIPINE BESYLATE 5 MG PO TABS: 10.0000 mg | ORAL_TABLET | Freq: Every day | ORAL | Status: DC

## 2017-04-19 MED ORDER — CALCIUM CARBONATE ANTACID 500 MG PO CHEW: 2.0000 | CHEWABLE_TABLET | Freq: Every day | ORAL | Status: DC | PRN

## 2017-04-19 MED ORDER — SODIUM CHLORIDE 0.9% FLUSH
3.0000 mL | Freq: Two times a day (BID) | INTRAVENOUS | Status: DC
  Administered 2017-04-20 (×2): 3 mL via INTRAVENOUS

## 2017-04-19 MED ORDER — SODIUM CHLORIDE 0.9% FLUSH: 3.0000 mL | INTRAVENOUS | Status: DC | PRN

## 2017-04-19 MED ORDER — AMLODIPINE BESYLATE 5 MG PO TABS
10.0000 mg | ORAL_TABLET | Freq: Every day | ORAL | Status: DC
  Administered 2017-04-19: 10 mg via ORAL
  Filled 2017-04-19: qty 2

## 2017-04-19 MED ORDER — ACETAMINOPHEN 325 MG PO TABS
650.0000 mg | ORAL_TABLET | ORAL | Status: DC | PRN
Start: 2017-04-19 — End: 2017-04-20

## 2017-04-19 MED ORDER — CALCITRIOL 0.5 MCG PO CAPS
0.5000 ug | ORAL_CAPSULE | Freq: Every day | ORAL | Status: DC
  Administered 2017-04-20: 0.5 ug via ORAL
  Filled 2017-04-19: qty 1

## 2017-04-19 MED ORDER — SODIUM CHLORIDE 0.9 % IV SOLN: 250.0000 mL | INTRAVENOUS | Status: DC | PRN

## 2017-04-19 MED ORDER — LABETALOL HCL 5 MG/ML IV SOLN
10.0000 mg | INTRAVENOUS | Status: DC | PRN
  Filled 2017-04-19: qty 4

## 2017-04-19 MED ORDER — LABETALOL HCL 5 MG/ML IV SOLN: 10.0000 mg | INTRAVENOUS | Status: DC | PRN

## 2017-04-19 MED ORDER — LOSARTAN POTASSIUM 50 MG PO TABS
100.0000 mg | ORAL_TABLET | Freq: Every day | ORAL | Status: DC
  Administered 2017-04-20: 100 mg via ORAL
  Filled 2017-04-19: qty 2

## 2017-04-19 MED ORDER — PANTOPRAZOLE SODIUM 40 MG PO TBEC
40.0000 mg | DELAYED_RELEASE_TABLET | Freq: Every day | ORAL | Status: DC
  Administered 2017-04-20: 40 mg via ORAL
  Filled 2017-04-19: qty 1

## 2017-04-19 MED ORDER — ONDANSETRON HCL 4 MG/2ML IJ SOLN: 4.0000 mg | Freq: Four times a day (QID) | INTRAMUSCULAR | Status: DC | PRN

## 2017-04-19 MED ORDER — LABETALOL HCL 200 MG PO TABS: 400.0000 mg | ORAL_TABLET | Freq: Two times a day (BID) | ORAL | Status: DC

## 2017-04-19 MED ORDER — IPRATROPIUM BROMIDE 0.03 % NA SOLN: 2.0000 | Freq: Two times a day (BID) | NASAL | Status: DC

## 2017-04-19 MED ORDER — TORSEMIDE 20 MG PO TABS
40.0000 mg | ORAL_TABLET | Freq: Every morning | ORAL | Status: DC
Start: 2017-04-20 — End: 2017-04-20
  Administered 2017-04-20: 40 mg via ORAL
  Filled 2017-04-19: qty 2

## 2017-04-19 MED ORDER — SPIRONOLACTONE 50 MG PO TABS
50.0000 mg | ORAL_TABLET | Freq: Every day | ORAL | Status: DC
  Administered 2017-04-20: 50 mg via ORAL
  Filled 2017-04-19: qty 2
  Filled 2017-04-19: qty 1

## 2017-04-19 MED ORDER — FUROSEMIDE 10 MG/ML IJ SOLN: 60.0000 mg | Freq: Two times a day (BID) | INTRAMUSCULAR | Status: DC

## 2017-04-19 MED ORDER — FUROSEMIDE 10 MG/ML IJ SOLN
60.0000 mg | Freq: Once | INTRAMUSCULAR | Status: AC
  Administered 2017-04-19: 60 mg via INTRAVENOUS
  Filled 2017-04-19: qty 8

## 2017-04-19 MED ORDER — ENOXAPARIN SODIUM 30 MG/0.3ML ~~LOC~~ SOLN
30.0000 mg | SUBCUTANEOUS | Status: DC
  Administered 2017-04-19: 30 mg via SUBCUTANEOUS
  Filled 2017-04-19 (×2): qty 0.3

## 2017-04-19 MED ORDER — HYDRALAZINE HCL 50 MG PO TABS
50.0000 mg | ORAL_TABLET | Freq: Three times a day (TID) | ORAL | Status: DC
  Administered 2017-04-19 – 2017-04-20 (×3): 50 mg via ORAL
  Filled 2017-04-19 (×3): qty 1

## 2017-04-19 MED ORDER — HYDRALAZINE HCL 50 MG PO TABS: 50.0000 mg | ORAL_TABLET | Freq: Three times a day (TID) | ORAL | Status: DC

## 2017-04-19 NOTE — ED Triage Notes (Addendum)
Pt reports continued coughing since she was treated for PNA some time ago. Pt reports she has felt more SOB and has some chest discomfort recently.

## 2017-04-19 NOTE — ED Notes (Signed)
Called lab and tubes available to add additional labs ordered.

## 2017-04-19 NOTE — ED Provider Notes (Signed)
[place] DEPT Provider Note   CSN: [id number] Arrival date & time: 04/19/17  1526     History   Chief Complaint Chief Complaint  Patient presents with  . Shortness of Breath    HPI [name] is a 72 y.o. female.  72 year old female presents with worsening dyspnea as well as cough with associated chest tightness.  No fever or chills.  Denies any exertional chest discomfort but has had exertional dyspnea.  Increased lower extremity edema.  Does have a prior history of CHF.  States that the symptoms have been present since completed treatment for pneumonia but have gotten worse.  No vomiting or diarrhea.  No treatment used prior to arrival.      Past Medical History:  Diagnosis Date  . Acute pancreatitis 02/04/2017   [name] 02/04/2017  . Adenomatous colon polyp 01/2002  . Anemia, chronic disease    /notes 02/04/2017  . Aortic valve disease    AI/AS  . Arthritis    "lower back, knees" (02/04/2017)  . CHF (congestive heart failure) ([place])   . CKD (chronic kidney disease), stage III ([place])   . Diverticulosis of colon   . DJD (degenerative joint disease)   . Frequent UTI   . Hemorrhoids   . Hypertension   . Lumbar back pain   . Neuropathy   . Obesity   . Pericardial effusion     in a patient with Diastolic heart  failure and Pericardial effusion  known since last 2 D echo in 11/2016 /notes 02/04/2017  . PONV (postoperative nausea and vomiting)   . Pulmonary HTN ([place])   . Renal cyst   . Venous insufficiency   . Vitamin D deficiency     Patient Active Problem List   Diagnosis Date Noted  . Nonrheumatic aortic valve insufficiency 03/10/2017  . Pulmonary HTN ([place]) 03/10/2017  . Chronic midline posterior neck pain 02/11/2017  . AKI (acute kidney injury) ([place])   . Lobar pneumonia ([place])   . Acute respiratory failure with hypoxia ([place])   . Epigastric abdominal pain 02/06/2017  . Pericardial effusion 02/04/2017  . Hypokalemia  01/24/2017  . Abnormal bone marrow examination 01/24/2017  . Stenosis of right carotid artery 08/27/2016  . LBBB (left bundle branch block) 08/27/2016  . Left-sided tinnitus 08/16/2016  . Anemia in chronic kidney disease 11/02/2015  . CKD (chronic kidney disease) stage 3, GFR 30-59 ml/min ([facility]) 04/26/2014  . Diastolic congestive heart failure ([place]) 04/26/2014  . Knee pain, bilateral 03/23/2012  . Murmur 02/24/2012  . Vitamin D deficiency 04/13/2008  . OBESITY 02/23/2008  . Radiculopathy 02/23/2008  . Essential hypertension 02/23/2008  . Venous (peripheral) insufficiency 02/23/2008    Past Surgical History:  Procedure Laterality Date  . ABDOMINAL HYSTERECTOMY     "partial"  . COLONOSCOPY W/ BIOPSIES AND POLYPECTOMY     "bx was ok"    OB History    No data available       Home Medications    Prior to Admission medications   Medication Sig Start Date End Date Taking? Authorizing Provider  acetaminophen-codeine (TYLENOL #3) 300-30 MG tablet Take 1 tablet by mouth every 6 (six) hours as needed for moderate pain.  07/15/16   [provider]  amLODipine (NORVASC) 10 MG tablet Take 1 tablet (10 mg total) by mouth daily. 05/01/14   Velvet Bathe, MD  benzonatate (TESSALON) 100 MG capsule Take 1 capsule (100 mg total) by mouth 3 (three) times daily as needed for cough.  04/16/17   Hoyt Koch, MD  calcitRIOL (ROCALTROL) 0.25 MCG capsule Take 0.5 mcg by mouth daily. 06/14/15   [provider]  calcium carbonate (TUMS - DOSED IN MG ELEMENTAL CALCIUM) 500 MG chewable tablet Chew 2 tablets by mouth daily as needed for indigestion or heartburn.    [provider]  diazepam (VALIUM) 2 MG tablet Take 1 tablet (2 mg total) by mouth every 12 (twelve) hours as needed for muscle spasms. 02/01/17   Ward, Ozella Almond, PA-C  famotidine (PEPCID) 20 MG tablet Take 1 tablet (20 mg total) 2 (two) times daily by mouth. 02/11/17   Debbe Odea, MD  fluticasone (FLONASE) 50  MCG/ACT nasal spray Place 2 sprays daily into both nostrils. 02/12/17   Debbe Odea, MD  gabapentin (NEURONTIN) 300 MG capsule TAKE 1 CAPSULE(300 MG) BY MOUTH THREE TIMES DAILY AS NEEDED FOR PAIN 01/14/17   Hoyt Koch, MD  hydrALAZINE (APRESOLINE) 50 MG tablet TAKE 1 TABLET BY MOUTH THREE TIMES DAILY Patient taking differently: TAKE 1 TABLET 50mg   BY MOUTH THREE TIMES DAILY 08/06/16   Hoyt Koch, MD  hydrocortisone (ANUSOL-HC) 2.5 % rectal cream Place 1 application rectally as needed. 05/24/13   Noralee Space, MD  ipratropium (ATROVENT) 0.03 % nasal spray Place 2 sprays into both nostrils 2 (two) times daily. Do not use for more than 5days. 11/27/16   Nche, Charlene Brooke, NP  labetalol (NORMODYNE) 200 MG tablet TAKE 2 TABLETS BY MOUTH TWICE DAILY Patient taking differently: take two tablets 400mg  total  twice  daily    Noralee Space, MD  losartan (COZAAR) 100 MG tablet TAKE 1 TABLET BY MOUTH EVERY DAY Patient taking differently: TAKE 1 TABLET  100mg  BY MOUTH EVERY DAY 02/27/12   Noralee Space, MD  magnesium gluconate (MAGONATE) 500 MG tablet Take 1 tablet by mouth 2 (two) times daily. 01/23/17   [provider]  ondansetron (ZOFRAN) 4 MG tablet Take 1 tablet (4 mg total) by mouth every 8 (eight) hours as needed for nausea or vomiting. 01/04/16   Hoyt Koch, MD  pantoprazole (PROTONIX) 40 MG tablet Take 1 tablet (40 mg total) daily by mouth. 02/12/17   Debbe Odea, MD  PROCRIT 01749 UNIT/ML injection Inject 40,000 Units into the skin every 14 (fourteen) days. 06/26/16   [provider]  Propylene Glycol (SYSTANE BALANCE) 0.6 % SOLN Place 1 drop into both eyes daily as needed (for dry eyes).    [provider]  spironolactone (ALDACTONE) 25 MG tablet Take 2 tablets (50 mg total) daily by mouth. 02/11/17   Debbe Odea, MD  torsemide (DEMADEX) 20 MG tablet TAKE 2 TABLETS BY MOUTH EVERY MORNING Patient taking differently: TAKE 2 TABLETS 40mg   total BY MOUTH EVERY MORNING 01/18/13   Noralee Space, MD  traMADol (ULTRAM) 50 MG tablet Take 1 tablet (50 mg total) by mouth 3 (three) times daily as needed for severe pain. 12/16/16   Duffy Bruce, MD  triamcinolone ointment (KENALOG) 0.5 % Apply 1 application topically 2 (two) times daily. 08/15/16   Hoyt Koch, MD    Family History Family History  Problem Relation Age of Onset  . Diabetes Mother   . Colon polyps Mother   . Hypertension Mother   . Hypertension Brother   . Hypertension Sister   . Colon cancer Neg Hx     Social History Social History   Tobacco Use  . Smoking status: Former Smoker    Packs/day: 0.40  Years: 15.00    Pack years: 6.00    Types: Cigarettes    Last attempt to quit: 04/08/1982    Years since quitting: 35.0  . Smokeless tobacco: Never Used  Substance Use Topics  . Alcohol use: No  . Drug use: No     Allergies   Minoxidil and Naproxen   Review of Systems Review of Systems  All other systems reviewed and are negative.    Physical Exam Updated Vital Signs BP (!) 179/70   Pulse 86   Temp (!) 97.4 F (36.3 C) (Oral)   Resp 16   SpO2 95%   Physical Exam  Constitutional: She is oriented to person, place, and time. She appears well-developed and well-nourished.  Non-toxic appearance. No distress.  HENT:  Head: Normocephalic and atraumatic.  Eyes: Conjunctivae, EOM and lids are normal. Pupils are equal, round, and reactive to light.  Neck: Normal range of motion. Neck supple. No tracheal deviation present. No thyroid mass present.  Cardiovascular: Normal rate, regular rhythm and normal heart sounds. Exam reveals no gallop.  No murmur heard. Pulmonary/Chest: Effort normal. No stridor. Tachypnea noted. No respiratory distress. She has decreased breath sounds in the right lower field and the left lower field. She has no wheezes. She has no rhonchi. She has no rales.  Abdominal: Soft. Normal appearance and bowel sounds are  normal. She exhibits no distension. There is no tenderness. There is no rebound and no CVA tenderness.  Musculoskeletal: Normal range of motion. She exhibits no edema or tenderness.  Neurological: She is alert and oriented to person, place, and time. She has normal strength. No cranial nerve deficit or sensory deficit. GCS eye subscore is 4. GCS verbal subscore is 5. GCS motor subscore is 6.  Skin: Skin is warm and dry. No abrasion and no rash noted.  Psychiatric: She has a normal mood and affect. Her speech is normal and behavior is normal.  Nursing note and vitals reviewed.    ED Treatments / Results  Labs (all labs ordered are listed, but only abnormal results are displayed) Labs Reviewed  BASIC METABOLIC PANEL - Abnormal; Notable for the following components:      Result Value   Glucose, Bld 109 (*)    BUN 22 (*)    Creatinine, Ser 2.06 (*)    GFR calc non Af Amer 23 (*)    GFR calc Af Amer 27 (*)    All other components within normal limits  CBC - Abnormal; Notable for the following components:   RBC 3.34 (*)    Hemoglobin 10.0 (*)    HCT 31.6 (*)    RDW 17.5 (*)    All other components within normal limits  I-STAT TROPONIN, ED - Abnormal; Notable for the following components:   Troponin i, poc 0.10 (*)    All other components within normal limits  BRAIN NATRIURETIC PEPTIDE  D-DIMER, QUANTITATIVE (NOT AT [facility])    EKG  EKG Interpretation  Date/Time:  Saturday April 19 2017 16:33:07 EST Ventricular Rate:  81 PR Interval:    QRS Duration: 174 QT Interval:  469 QTC Calculation: 545 R Axis:   -63 Text Interpretation:  Sinus rhythm Borderline prolonged PR interval Probable left atrial enlargement Left bundle branch block Confirmed by Lacretia Leigh ([id number]) on 04/19/2017 6:35:31 PM       Radiology Dg Chest 2 View  Result Date: 04/19/2017 CLINICAL DATA:  Pt reports continued coughing since she was treated for PNA some time ago. Pt  reports she has felt more SOB and  has some chest discomfort recently. H/o CHF, Pericardial Effusion, HTN. Former smoker. EXAM: CHEST  2 VIEW COMPARISON:  02/05/2017 FINDINGS: Midline trachea. Marked enlargement of the cardiopericardial silhouette. No pleural effusion or pneumothorax. No congestive failure. The right lung is clear. The left lower lobe is not well evaluated secondary to the extent of cardiomegaly. Significantly improved aeration compared to the prior exam. IMPRESSION: Cardiomegaly, without congestive failure. No convincing evidence of residual or recurrent pneumonia. Electronically Signed   By: Abigail Miyamoto M.D.   On: 04/19/2017 17:19    Procedures Procedures (including critical care time)  Medications Ordered in ED Medications - No data to display   Initial Impression / Assessment and Plan / ED Course  I have reviewed the triage vital signs and the nursing notes.  Pertinent labs & imaging results that were available during my care of the patient were reviewed by me and considered in my medical decision making (see chart for details).     Patient with elevated BNP consistent with CHF.  Does have elevated d-dimer but patient clinically more consistent with CHF.  Give dose of IV Lasix here and admit to the hospitalist service  Final Clinical Impressions(s) / ED Diagnoses   Final diagnoses:  None    ED Discharge Orders    None       Lacretia Leigh, MD 04/19/17 2009

## 2017-04-19 NOTE — ED Notes (Signed)
ED TO INPATIENT HANDOFF REPORT  Name/Age/Gender [name] 72 y.o. female  Code Status    Code Status Orders  (From admission, onward)        Start     Ordered   04/19/17 2016  Full code  Continuous     04/19/17 2017    Code Status History    Date Active Date Inactive Code Status Order ID Comments User Context   02/04/2017 16:34 02/12/2017 20:54 Full Code [id number]  Elease Hashimoto ED   04/26/2014 22:01 05/01/2014 18:14 Full Code [id number]  Ivor Costa, MD ED      Home/SNF/Other   Chief Complaint Trouble Breathing / Coughing   Level of Care/Admitting Diagnosis ED Disposition    ED Disposition Condition [facility] Area: [facility] [[id number]]  Level of Care: Telemetry [5]  Admit to tele based on following criteria: Acute CHF  Diagnosis: Acute on chronic diastolic (congestive) heart failure [facility] [[id number]]  Admitting Physician: Etta Quill [phone]  Attending Physician: Etta Quill [[id number]]  PT Class (Do Not Modify): Observation [104]  PT Acc Code (Do Not Modify): Observation [10022]       Medical History Past Medical History:  Diagnosis Date  . Acute pancreatitis 02/04/2017   [name] 02/04/2017  . Adenomatous colon polyp 01/2002  . Anemia, chronic disease    /notes 02/04/2017  . Aortic valve disease    AI/AS  . Arthritis    "lower back, knees" (02/04/2017)  . CHF (congestive heart failure) ([place])   . CKD (chronic kidney disease), stage III ([place])   . Diverticulosis of colon   . DJD (degenerative joint disease)   . Frequent UTI   . Hemorrhoids   . Hypertension   . Lumbar back pain   . Neuropathy   . Obesity   . Pericardial effusion     in a patient with Diastolic heart  failure and Pericardial effusion  known since last 2 D echo in 11/2016 /notes 02/04/2017  . PONV (postoperative nausea and vomiting)   . Pulmonary HTN ([place])   . Renal cyst   . Venous insufficiency   . Vitamin D deficiency      Allergies Allergies  Allergen Reactions  . Minoxidil Palpitations and Other (See Comments)    unable to sleep  . Naproxen Swelling    IV Location/Drains/Wounds Patient Lines/Drains/Airways Status   Active Line/Drains/Airways    Name:   Placement date:   Placement time:   Site:   Days:   Peripheral IV 04/19/17 Right Antecubital   04/19/17    1852    Antecubital   less than 1          Labs/Imaging Results for orders placed or performed during the hospital encounter of 04/19/17 (from the past 48 hour(s))  Basic metabolic panel     Status: Abnormal   Collection Time: 04/19/17  4:53 PM  Result Value Ref Range   Sodium 142 135 - 145 mmol/L   Potassium 3.7 3.5 - 5.1 mmol/L   Chloride 107 101 - 111 mmol/L   CO2 28 22 - 32 mmol/L   Glucose, Bld 109 (H) 65 - 99 mg/dL   BUN 22 (H) 6 - 20 mg/dL   Creatinine, Ser 2.06 (H) 0.44 - 1.00 mg/dL   Calcium 9.7 8.9 - 10.3 mg/dL   GFR calc non Af Amer 23 (L) >60 mL/min   GFR calc Af Amer 27 (L) >60 mL/min    Comment: (  NOTE) The eGFR has been calculated using the CKD EPI equation. This calculation has not been validated in all clinical situations. eGFR's persistently <60 mL/min signify possible Chronic Kidney Disease.    Anion gap 7 5 - 15  CBC     Status: Abnormal   Collection Time: 04/19/17  4:53 PM  Result Value Ref Range   WBC 5.8 4.0 - 10.5 K/uL   RBC 3.34 (L) 3.87 - 5.11 MIL/uL   Hemoglobin 10.0 (L) 12.0 - 15.0 g/dL   HCT 31.6 (L) 36.0 - 46.0 %   MCV 94.6 78.0 - 100.0 fL   MCH 29.9 26.0 - 34.0 pg   MCHC 31.6 30.0 - 36.0 g/dL   RDW 17.5 (H) 11.5 - 15.5 %   Platelets 199 150 - 400 K/uL  Brain natriuretic peptide     Status: Abnormal   Collection Time: 04/19/17  4:53 PM  Result Value Ref Range   B Natriuretic Peptide 766.4 (H) 0.0 - 100.0 pg/mL  D-dimer, quantitative (not at [facility])     Status: Abnormal   Collection Time: 04/19/17  4:53 PM  Result Value Ref Range   D-Dimer, Quant 0.90 (H) 0.00 - 0.50 ug/mL-FEU    Comment:  (NOTE) At the manufacturer cut-off of 0.50 ug/mL FEU, this assay has been documented to exclude PE with a sensitivity and negative predictive value of 97 to 99%.  At this time, this assay has not been approved by the FDA to exclude DVT/VTE. Results should be correlated with clinical presentation.   I-stat troponin, ED     Status: Abnormal   Collection Time: 04/19/17  5:09 PM  Result Value Ref Range   Troponin i, poc 0.10 (HH) 0.00 - 0.08 ng/mL   Comment NOTIFIED PHYSICIAN    Comment 3            Comment: Due to the release kinetics of cTnI, a negative result within the first hours of the onset of symptoms does not rule out myocardial infarction with certainty. If myocardial infarction is still suspected, repeat the test at appropriate intervals.    Dg Chest 2 View  Result Date: 04/19/2017 CLINICAL DATA:  Pt reports continued coughing since she was treated for PNA some time ago. Pt reports she has felt more SOB and has some chest discomfort recently. H/o CHF, Pericardial Effusion, HTN. Former smoker. EXAM: CHEST  2 VIEW COMPARISON:  02/05/2017 FINDINGS: Midline trachea. Marked enlargement of the cardiopericardial silhouette. No pleural effusion or pneumothorax. No congestive failure. The right lung is clear. The left lower lobe is not well evaluated secondary to the extent of cardiomegaly. Significantly improved aeration compared to the prior exam. IMPRESSION: Cardiomegaly, without congestive failure. No convincing evidence of residual or recurrent pneumonia. Electronically Signed   By: Abigail Miyamoto M.D.   On: 04/19/2017 17:19    Pending Labs Unresulted Labs (From admission, onward)   Start     Ordered   04/20/17 9381  Basic metabolic panel  Daily,   R     04/19/17 2017      Vitals/Pain Today's Vitals   04/19/17 1945 04/19/17 2000 04/19/17 2004 04/19/17 2030  BP: (!) 191/70 (!) 188/63  (!) 199/67  Pulse: 88 85  93  Resp: 20 (!) 24  20  Temp:      TempSrc:      SpO2: 94%      PainSc:   0-No pain     Isolation Precautions No active isolations  Medications Medications  sodium chloride  flush (NS) 0.9 % injection 3 mL (not administered)  sodium chloride flush (NS) 0.9 % injection 3 mL (not administered)  0.9 %  sodium chloride infusion (not administered)  acetaminophen (TYLENOL) tablet 650 mg (not administered)  ondansetron (ZOFRAN) injection 4 mg (not administered)  enoxaparin (LOVENOX) injection 30 mg (not administered)  calcitRIOL (ROCALTROL) capsule 0.5 mcg (not administered)  calcium carbonate (TUMS - dosed in mg elemental calcium) chewable tablet 400 mg of elemental calcium (not administered)  pantoprazole (PROTONIX) EC tablet 40 mg (not administered)  torsemide (DEMADEX) tablet 40 mg (not administered)  spironolactone (ALDACTONE) tablet 50 mg (not administered)  ipratropium (ATROVENT) 0.03 % nasal spray 2 spray (not administered)  losartan (COZAAR) tablet 100 mg (not administered)  amLODipine (NORVASC) tablet 10 mg (not administered)  labetalol (NORMODYNE) tablet 400 mg (not administered)  hydrALAZINE (APRESOLINE) tablet 50 mg (not administered)  labetalol (NORMODYNE,TRANDATE) injection 10-20 mg (not administered)  furosemide (LASIX) injection 60 mg (60 mg Intravenous Given 04/19/17 2024)    Mobility

## 2017-04-19 NOTE — H&P (Signed)
History and Physical    [name] FSE:[id number] DOB: May 07, 1945 DOA: 04/19/2017  PCP: Hoyt Koch, MD  Patient coming from: Home  I have personally briefly reviewed patient's old medical records in [place]  Chief Complaint: SOB  HPI: [name] is a 72 y.o. female with medical history significant of diastolic CHF, HTN, CKD stage 4.  Patient presents to the ED with c/o worsening dyspnea, cough, chest tightness.  Symptoms present since completing treatment for PNA some time ago.  Has persisted and worsened.  Denies wt gain, denies leg swelling more than baseline.  States she is taking all meds as directed.   ED Course: BP 435 systolic.  BNP 700, trop 0.10.  Given Lasix 60mg .   Review of Systems: As per HPI otherwise 10 point review of systems negative.   Past Medical History:  Diagnosis Date  . Acute pancreatitis 02/04/2017   [name] 02/04/2017  . Adenomatous colon polyp 01/2002  . Anemia, chronic disease    /notes 02/04/2017  . Aortic valve disease    AI/AS  . Arthritis    "lower back, knees" (02/04/2017)  . CHF (congestive heart failure) ([place])   . CKD (chronic kidney disease), stage III ([place])   . Diverticulosis of colon   . DJD (degenerative joint disease)   . Frequent UTI   . Hemorrhoids   . Hypertension   . Lumbar back pain   . Neuropathy   . Obesity   . Pericardial effusion     in a patient with Diastolic heart  failure and Pericardial effusion  known since last 2 D echo in 11/2016 /notes 02/04/2017  . PONV (postoperative nausea and vomiting)   . Pulmonary HTN ([place])   . Renal cyst   . Venous insufficiency   . Vitamin D deficiency     Past Surgical History:  Procedure Laterality Date  . ABDOMINAL HYSTERECTOMY     "partial"  . COLONOSCOPY W/ BIOPSIES AND POLYPECTOMY     "bx was ok"     reports that she quit smoking about 35 years ago. Her smoking use included cigarettes. She has a 6.00 pack-year smoking history. she has never used  smokeless tobacco. She reports that she does not drink alcohol or use drugs.  Allergies  Allergen Reactions  . Minoxidil Palpitations and Other (See Comments)    unable to sleep  . Naproxen Swelling    Family History  Problem Relation Age of Onset  . Diabetes Mother   . Colon polyps Mother   . Hypertension Mother   . Hypertension Brother   . Hypertension Sister   . Colon cancer Neg Hx      Prior to Admission medications   Medication Sig Start Date End Date Taking? Authorizing Provider  amLODipine (NORVASC) 10 MG tablet Take 1 tablet (10 mg total) by mouth daily. 05/01/14   Velvet Bathe, MD  benzonatate (TESSALON) 100 MG capsule Take 1 capsule (100 mg total) by mouth 3 (three) times daily as needed for cough. 04/16/17   Hoyt Koch, MD  calcitRIOL (ROCALTROL) 0.25 MCG capsule Take 0.5 mcg by mouth daily. 06/14/15   [provider]  calcium carbonate (TUMS - DOSED IN MG ELEMENTAL CALCIUM) 500 MG chewable tablet Chew 2 tablets by mouth daily as needed for indigestion or heartburn.    [provider]  fluticasone (FLONASE) 50 MCG/ACT nasal spray Place 2 sprays daily into both nostrils. 02/12/17   Debbe Odea, MD  gabapentin (NEURONTIN) 300 MG capsule TAKE  1 CAPSULE(300 MG) BY MOUTH THREE TIMES DAILY AS NEEDED FOR PAIN 01/14/17   Hoyt Koch, MD  hydrALAZINE (APRESOLINE) 50 MG tablet TAKE 1 TABLET BY MOUTH THREE TIMES DAILY Patient taking differently: TAKE 1 TABLET 50mg   BY MOUTH THREE TIMES DAILY 08/06/16   Hoyt Koch, MD  hydrocortisone (ANUSOL-HC) 2.5 % rectal cream Place 1 application rectally as needed. 05/24/13   Noralee Space, MD  ipratropium (ATROVENT) 0.03 % nasal spray Place 2 sprays into both nostrils 2 (two) times daily. Do not use for more than 5days. 11/27/16   Nche, Charlene Brooke, NP  labetalol (NORMODYNE) 200 MG tablet TAKE 2 TABLETS BY MOUTH TWICE DAILY Patient taking differently: take two tablets 400mg  total  twice  daily     Noralee Space, MD  losartan (COZAAR) 100 MG tablet TAKE 1 TABLET BY MOUTH EVERY DAY Patient taking differently: TAKE 1 TABLET  100mg  BY MOUTH EVERY DAY 02/27/12   Noralee Space, MD  ondansetron (ZOFRAN) 4 MG tablet Take 1 tablet (4 mg total) by mouth every 8 (eight) hours as needed for nausea or vomiting. 01/04/16   Hoyt Koch, MD  pantoprazole (PROTONIX) 40 MG tablet Take 1 tablet (40 mg total) daily by mouth. 02/12/17   Debbe Odea, MD  PROCRIT 10626 UNIT/ML injection Inject 40,000 Units into the skin every 14 (fourteen) days. 06/26/16   [provider]  Propylene Glycol (SYSTANE BALANCE) 0.6 % SOLN Place 1 drop into both eyes daily as needed (for dry eyes).    [provider]  spironolactone (ALDACTONE) 25 MG tablet Take 2 tablets (50 mg total) daily by mouth. 02/11/17   Debbe Odea, MD  torsemide (DEMADEX) 20 MG tablet TAKE 2 TABLETS BY MOUTH EVERY MORNING Patient taking differently: TAKE 2 TABLETS 40mg  total BY MOUTH EVERY MORNING 01/18/13   Noralee Space, MD  traMADol (ULTRAM) 50 MG tablet Take 1 tablet (50 mg total) by mouth 3 (three) times daily as needed for severe pain. 12/16/16   Duffy Bruce, MD  triamcinolone ointment (KENALOG) 0.5 % Apply 1 application topically 2 (two) times daily. 08/15/16   Hoyt Koch, MD    Physical Exam: Vitals:   04/19/17 1930 04/19/17 1945 04/19/17 2000 04/19/17 2030  BP: (!) 191/70 (!) 191/70 (!) 188/63 (!) 199/67  Pulse: 87 88 85 93  Resp: 19 20 (!) 24 20  Temp:      TempSrc:      SpO2:  94%      Constitutional: NAD, calm, comfortable Eyes: PERRL, lids and conjunctivae normal ENMT: Mucous membranes are moist. Posterior pharynx clear of any exudate or lesions.Normal dentition.  Neck: normal, supple, no masses, no thyromegaly Respiratory: clear to auscultation bilaterally, no wheezing, no crackles. Normal respiratory effort. No accessory muscle use.  Cardiovascular: Regular rate and rhythm, no murmurs /  rubs / gallops. No extremity edema. 2+ pedal pulses. No carotid bruits.  Abdomen: no tenderness, no masses palpated. No hepatosplenomegaly. Bowel sounds positive.  Musculoskeletal: no clubbing / cyanosis. No joint deformity upper and lower extremities. Good ROM, no contractures. Normal muscle tone.  Skin: no rashes, lesions, ulcers. No induration Neurologic: CN 2-12 grossly intact. Sensation intact, DTR normal. Strength 5/5 in all 4.  Psychiatric: Normal judgment and insight. Alert and oriented x 3. Normal mood.    Labs on Admission: I have personally reviewed following labs and imaging studies  CBC: Recent Labs  Lab 04/19/17 1653  WBC 5.8  HGB 10.0*  HCT 31.6*  MCV 94.6  PLT 253   Basic Metabolic Panel: Recent Labs  Lab 04/19/17 1653  NA 142  K 3.7  CL 107  CO2 28  GLUCOSE 109*  BUN 22*  CREATININE 2.06*  CALCIUM 9.7   GFR: CrCl cannot be calculated (Unknown ideal weight.). Liver Function Tests: No results for input(s): AST, ALT, ALKPHOS, BILITOT, PROT, ALBUMIN in the last 168 hours. No results for input(s): LIPASE, AMYLASE in the last 168 hours. No results for input(s): AMMONIA in the last 168 hours. Coagulation Profile: No results for input(s): INR, PROTIME in the last 168 hours. Cardiac Enzymes: No results for input(s): CKTOTAL, CKMB, CKMBINDEX, TROPONINI in the last 168 hours. BNP (last 3 results) Recent Labs    11/27/16 1036  PROBNP 202.0*   HbA1C: No results for input(s): HGBA1C in the last 72 hours. CBG: No results for input(s): GLUCAP in the last 168 hours. Lipid Profile: No results for input(s): CHOL, HDL, LDLCALC, TRIG, CHOLHDL, LDLDIRECT in the last 72 hours. Thyroid Function Tests: No results for input(s): TSH, T4TOTAL, FREET4, T3FREE, THYROIDAB in the last 72 hours. Anemia Panel: No results for input(s): VITAMINB12, FOLATE, FERRITIN, TIBC, IRON, RETICCTPCT in the last 72 hours. Urine analysis:    Component Value Date/Time   COLORURINE STRAW  (A) 02/05/2017 2215   APPEARANCEUR CLEAR 02/05/2017 2215   LABSPEC 1.006 02/05/2017 2215   PHURINE 5.0 02/05/2017 2215   GLUCOSEU NEGATIVE 02/05/2017 2215   GLUCOSEU NEGATIVE 12/08/2015 1024   HGBUR NEGATIVE 02/05/2017 2215   BILIRUBINUR NEGATIVE 02/05/2017 2215   BILIRUBINUR neg 01/04/2016 1054   [place] 02/05/2017 2215   PROTEINUR NEGATIVE 02/05/2017 2215   UROBILINOGEN negative 01/04/2016 1054   UROBILINOGEN 0.2 12/08/2015 1024   NITRITE NEGATIVE 02/05/2017 2215   LEUKOCYTESUR NEGATIVE 02/05/2017 2215    Radiological Exams on Admission: Dg Chest 2 View  Result Date: 04/19/2017 CLINICAL DATA:  Pt reports continued coughing since she was treated for PNA some time ago. Pt reports she has felt more SOB and has some chest discomfort recently. H/o CHF, Pericardial Effusion, HTN. Former smoker. EXAM: CHEST  2 VIEW COMPARISON:  02/05/2017 FINDINGS: Midline trachea. Marked enlargement of the cardiopericardial silhouette. No pleural effusion or pneumothorax. No congestive failure. The right lung is clear. The left lower lobe is not well evaluated secondary to the extent of cardiomegaly. Significantly improved aeration compared to the prior exam. IMPRESSION: Cardiomegaly, without congestive failure. No convincing evidence of residual or recurrent pneumonia. Electronically Signed   By: Abigail Miyamoto M.D.   On: 04/19/2017 17:19    EKG: Independently reviewed.  Assessment/Plan Principal Problem:   Acute on chronic diastolic CHF (congestive heart failure) (HCC) Active Problems:   CKD (chronic kidney disease) stage 4, GFR 15-29 ml/min (HCC)   Hypertensive urgency    1. HTN urgency / acute on chronic diastolic CHF -  1. CHF pathway 2. Continue home BP meds 3. Add PRN IV labetalol 4. 2d echo 5. Not sure that she is more fluid overloaded than baseline, more so than her BP is just out of control. 6. If still symptomatic after controlling BP, then diurese further. 2. CKD stage 4  - 1. Chronic, and at baseline  DVT prophylaxis: Lovenox Code Status: Full Family Communication: Husband at bedside Disposition Plan: Home after admit Consults called: None Admission status: Place in [place], [place] [facility] Pager [phone]  If 7AM-7PM, please contact day team taking care of patient [email or web address] Password [facility]  04/19/2017, 9:17 PM

## 2017-04-19 NOTE — ED Notes (Signed)
I-stat trop results were given to Dr. Venora Maples. Nurse aware

## 2017-04-20 ENCOUNTER — Observation Stay (HOSPITAL_BASED_OUTPATIENT_CLINIC_OR_DEPARTMENT_OTHER): Payer: Medicare Other

## 2017-04-20 DIAGNOSIS — I509 Heart failure, unspecified: Secondary | ICD-10-CM

## 2017-04-20 DIAGNOSIS — I5033 Acute on chronic diastolic (congestive) heart failure: Secondary | ICD-10-CM

## 2017-04-20 LAB — ECHOCARDIOGRAM COMPLETE
AVPHT: 248 ms
Ao-asc: 38 cm
EWDT: 169 ms
FS: 24 % — AB (ref 28–44)
HEIGHTINCHES: 66 in
IVS/LV PW RATIO, ED: 0.68
LA ID, A-P, ES: 41 mm
LA diam index: 1.95 cm/m2
LA vol A4C: 112 ml
LA vol index: 49.9 mL/m2
LA vol: 105 mL
LDCA: 3.46 cm2
LEFT ATRIUM END SYS DIAM: 41 mm
LVOT diameter: 21 mm
MV Dec: 169
MV Peak grad: 5 mmHg
MV pk A vel: 159 m/s
MV pk E vel: 108 m/s
PV Reg grad dias: 17 mmHg
PV Reg vel dias: 205 cm/s
PW: 20.3 mm — AB (ref 0.6–1.1)
RV TAPSE: 23.9 mm
RV sys press: 56 mmHg
Reg peak vel: 320 cm/s
TR max vel: 320 cm/s
WEIGHTICAEL: 3245.17 [oz_av]

## 2017-04-20 LAB — BASIC METABOLIC PANEL
Anion gap: 7 (ref 5–15)
BUN: 22 mg/dL — AB (ref 6–20)
CALCIUM: 9.9 mg/dL (ref 8.9–10.3)
CO2: 29 mmol/L (ref 22–32)
CREATININE: 2 mg/dL — AB (ref 0.44–1.00)
Chloride: 107 mmol/L (ref 101–111)
GFR calc Af Amer: 28 mL/min — ABNORMAL LOW (ref >60)
GFR, EST NON AFRICAN AMERICAN: 24 mL/min — AB (ref >60)
Glucose, Bld: 126 mg/dL — ABNORMAL HIGH (ref 65–99)
Potassium: 3.4 mmol/L — ABNORMAL LOW (ref 3.5–5.1)
SODIUM: 143 mmol/L (ref 135–145)

## 2017-04-20 LAB — MAGNESIUM: MAGNESIUM: 1.7 mg/dL (ref 1.7–2.4)

## 2017-04-20 LAB — TROPONIN I: Troponin I: 0.05 ng/mL (ref <0.03)

## 2017-04-20 MED ORDER — LOSARTAN POTASSIUM 100 MG PO TABS: 100.0000 mg | ORAL_TABLET | Freq: Every day | ORAL | 11 refills | Status: DC

## 2017-04-20 MED ORDER — IPRATROPIUM BROMIDE 0.03 % NA SOLN
2.0000 | Freq: Two times a day (BID) | NASAL | Status: DC
  Administered 2017-04-20: 2 via NASAL
  Filled 2017-04-20: qty 30

## 2017-04-20 NOTE — Progress Notes (Signed)
  Echocardiogram 2D Echocardiogram has been performed.  [name] G [place] 04/20/2017, 2:18 PM

## 2017-04-20 NOTE — Discharge Summary (Signed)
Physician Discharge Summary  [name] BMW:[id number] DOB: 06-Sep-1945 DOA: 04/19/2017  PCP: Hoyt Koch, MD  Admit date: 04/19/2017 Discharge date: 04/20/2017  Admitted From: home Disposition:  home  Recommendations for Outpatient Follow-up:  1. Follow up with PCP in 1-2 weeks 2. Please obtain BMP/CBC in one week 3. Please follow up on the following pending results:  Home Health:none  Equipment/Devices:none   Discharge Condition: stable  CODE STATUS: full Diet recommendation: cardiac  Brief/Interim Summary: [name] is a 72 y.o. female with medical history significant of diastolic CHF, HTN, CKD stage 4.  Patient presents to the ED with c/o worsening dyspnea, cough, chest tightness.  Symptoms present since completing treatment for PNA some time ago.  Has persisted and worsened. Denies wt gain, denies leg swelling more than baseline.  States she is taking all meds as directed. Found to have malignant hypertension. Her blood pressure was aggressively managed and has normalized on her home medications. Troponin initially was minimally elevated, decreased on repeat. Echo was preformed, showing normal EF, hypokinesis in base and Grade 1 diastolic dysfunction. Patient discharged to home.  Note: just prior to discharge, patient had 6 beat run on non-sustained Vtach. Patient was completely asymptomatic during that time. Magnesium checked, which was normal. Patient will follow up with cardiology with Holter Monitor.  Discharge Diagnoses:  Principal Problem:   Acute on chronic diastolic CHF (congestive heart failure) (HCC) Active Problems:   CKD (chronic kidney disease) stage 4, GFR 15-29 ml/min (HCC)   Hypertensive urgency    Discharge Instructions   Allergies as of 04/20/2017      Reactions   Minoxidil Palpitations, Other (See Comments)   unable to sleep   Naproxen Swelling      Medication List    TAKE these medications   amLODipine 10 MG tablet Commonly known  as:  NORVASC Take 1 tablet (10 mg total) by mouth daily.   benzonatate 100 MG capsule Commonly known as:  TESSALON Take 1 capsule (100 mg total) by mouth 3 (three) times daily as needed for cough.   calcitRIOL 0.25 MCG capsule Commonly known as:  ROCALTROL Take 0.5 mcg by mouth daily.   calcium carbonate 500 MG chewable tablet Commonly known as:  TUMS - dosed in mg elemental calcium Chew 2 tablets by mouth daily as needed for indigestion or heartburn.   fluticasone 50 MCG/ACT nasal spray Commonly known as:  FLONASE Place 2 sprays daily into both nostrils.   gabapentin 300 MG capsule Commonly known as:  NEURONTIN TAKE 1 CAPSULE(300 MG) BY MOUTH THREE TIMES DAILY AS NEEDED FOR PAIN   hydrALAZINE 50 MG tablet Commonly known as:  APRESOLINE TAKE 1 TABLET BY MOUTH THREE TIMES DAILY What changed:    how much to take  how to take this  when to take this   hydrocortisone 2.5 % rectal cream Commonly known as:  ANUSOL-HC Place 1 application rectally as needed.   ipratropium 0.03 % nasal spray Commonly known as:  ATROVENT Place 2 sprays into both nostrils 2 (two) times daily. Do not use for more than 5days.   labetalol 200 MG tablet Commonly known as:  NORMODYNE TAKE 2 TABLETS BY MOUTH TWICE DAILY What changed:    how much to take  how to take this  when to take this   losartan 100 MG tablet Commonly known as:  COZAAR Take 1 tablet (100 mg total) by mouth daily. What changed:    how much to take  how to take  this  when to take this   ondansetron 4 MG tablet Commonly known as:  ZOFRAN Take 1 tablet (4 mg total) by mouth every 8 (eight) hours as needed for nausea or vomiting.   pantoprazole 40 MG tablet Commonly known as:  PROTONIX Take 1 tablet (40 mg total) daily by mouth.   PROCRIT 58099 UNIT/ML injection Generic drug:  epoetin alfa Inject 40,000 Units into the skin every 14 (fourteen) days.   spironolactone 25 MG tablet Commonly known as:   ALDACTONE Take 2 tablets (50 mg total) daily by mouth.   SYSTANE BALANCE 0.6 % Soln Generic drug:  Propylene Glycol Place 1 drop into both eyes daily as needed (for dry eyes).   torsemide 20 MG tablet Commonly known as:  DEMADEX TAKE 2 TABLETS BY MOUTH EVERY MORNING What changed:    how much to take  how to take this  when to take this   traMADol 50 MG tablet Commonly known as:  ULTRAM Take 1 tablet (50 mg total) by mouth 3 (three) times daily as needed for severe pain.   triamcinolone ointment 0.5 % Commonly known as:  KENALOG Apply 1 application topically 2 (two) times daily.      Follow-up Information    Hoyt Koch, MD Follow up in 1 week(s).   Specialty:  Internal Medicine Contact information: [place] [id number] [phone]          Allergies  Allergen Reactions  . Minoxidil Palpitations and Other (See Comments)    unable to sleep  . Naproxen Swelling    Consultations:  none   Procedures/Studies: Dg Chest 2 View  Result Date: 04/19/2017 CLINICAL DATA:  Pt reports continued coughing since she was treated for PNA some time ago. Pt reports she has felt more SOB and has some chest discomfort recently. H/o CHF, Pericardial Effusion, HTN. Former smoker. EXAM: CHEST  2 VIEW COMPARISON:  02/05/2017 FINDINGS: Midline trachea. Marked enlargement of the cardiopericardial silhouette. No pleural effusion or pneumothorax. No congestive failure. The right lung is clear. The left lower lobe is not well evaluated secondary to the extent of cardiomegaly. Significantly improved aeration compared to the prior exam. IMPRESSION: Cardiomegaly, without congestive failure. No convincing evidence of residual or recurrent pneumonia. Electronically Signed   By: Abigail Miyamoto M.D.   On: 04/19/2017 17:19       Subjective:   Discharge Exam: Vitals:   04/20/17 0537 04/20/17 1532  BP: (!) 149/56 (!) 128/56  Pulse: 81 78  Resp: (!) 21 20  Temp:  98.4 F (36.9 C) 98.5 F (36.9 C)  SpO2: 96% 97%   Vitals:   04/19/17 2135 04/20/17 0009 04/20/17 0537 04/20/17 1532  BP: (!) 199/77 (!) 126/52 (!) 149/56 (!) 128/56  Pulse: 94 74 81 78  Resp: (!) 22  (!) 21 20  Temp: 98.2 F (36.8 C)  98.4 F (36.9 C) 98.5 F (36.9 C)  TempSrc: Oral  Oral Oral  SpO2: 99%  96% 97%  Weight: 92.8 kg (204 lb 9.6 oz)  92 kg (202 lb 13.2 oz)   Height: 5\' 6"  (1.676 m)       General: Pt is alert, awake, not in acute distress Cardiovascular: RRR, S1/S2 +, no rubs, no gallops Respiratory: CTA bilaterally, no wheezing, no rhonchi Abdominal: Soft, NT, ND, bowel sounds + Extremities: no edema, no cyanosis    The results of significant diagnostics from this hospitalization (including imaging, microbiology, ancillary and laboratory) are listed below for  reference.     Microbiology: No results found for this or any previous visit (from the past 240 hour(s)).   Labs: BNP (last 3 results) Recent Labs    04/19/17 1653  BNP 654.6*   Basic Metabolic Panel: Recent Labs  Lab 04/19/17 1653 04/20/17 0531  NA 142 143  K 3.7 3.4*  CL 107 107  CO2 28 29  GLUCOSE 109* 126*  BUN 22* 22*  CREATININE 2.06* 2.00*  CALCIUM 9.7 9.9   Liver Function Tests: No results for input(s): AST, ALT, ALKPHOS, BILITOT, PROT, ALBUMIN in the last 168 hours. No results for input(s): LIPASE, AMYLASE in the last 168 hours. No results for input(s): AMMONIA in the last 168 hours. CBC: Recent Labs  Lab 04/19/17 1653  WBC 5.8  HGB 10.0*  HCT 31.6*  MCV 94.6  PLT 199   Cardiac Enzymes: No results for input(s): CKTOTAL, CKMB, CKMBINDEX, TROPONINI in the last 168 hours. BNP: Invalid input(s): POCBNP CBG: No results for input(s): GLUCAP in the last 168 hours. D-Dimer Recent Labs    04/19/17 1653  DDIMER 0.90*   Hgb A1c No results for input(s): HGBA1C in the last 72 hours. Lipid Profile No results for input(s): CHOL, HDL, LDLCALC, TRIG, CHOLHDL, LDLDIRECT in  the last 72 hours. Thyroid function studies No results for input(s): TSH, T4TOTAL, T3FREE, THYROIDAB in the last 72 hours.  Invalid input(s): FREET3 Anemia work up No results for input(s): VITAMINB12, FOLATE, FERRITIN, TIBC, IRON, RETICCTPCT in the last 72 hours. Urinalysis    Component Value Date/Time   COLORURINE STRAW (A) 02/05/2017 2215   APPEARANCEUR CLEAR 02/05/2017 2215   LABSPEC 1.006 02/05/2017 2215   PHURINE 5.0 02/05/2017 2215   GLUCOSEU NEGATIVE 02/05/2017 2215   GLUCOSEU NEGATIVE 12/08/2015 1024   HGBUR NEGATIVE 02/05/2017 2215   BILIRUBINUR NEGATIVE 02/05/2017 2215   BILIRUBINUR neg 01/04/2016 1054   [place] 02/05/2017 2215   PROTEINUR NEGATIVE 02/05/2017 2215   UROBILINOGEN negative 01/04/2016 1054   UROBILINOGEN 0.2 12/08/2015 1024   NITRITE NEGATIVE 02/05/2017 2215   LEUKOCYTESUR NEGATIVE 02/05/2017 2215   Sepsis Labs Invalid input(s): PROCALCITONIN,  WBC,  LACTICIDVEN Microbiology No results found for this or any previous visit (from the past 240 hour(s)).   Time coordinating discharge: Over 30 minutes  SIGNED:   Truett Mainland, DO [facility] 04/20/2017, 4:22 PM Pager   If 7PM-7AM, please contact night-coverage [email or web address] Password [email or web address]

## 2017-04-20 NOTE — Discharge Instructions (Signed)
Heart Failure °Heart failure means your heart has trouble pumping blood. This makes it hard for your body to work well. Heart failure is usually a long-term (chronic) condition. You must take good care of yourself and follow your doctor's treatment plan. °Follow these instructions at home: °· Take your heart medicine as told by your doctor. °? Do not stop taking medicine unless your doctor tells you to. °? Do not skip any dose of medicine. °? Refill your medicines before they run out. °? Take other medicines only as told by your doctor or pharmacist. °· Stay active if told by your doctor. The elderly and people with severe heart failure should talk with a doctor about physical activity. °· Eat heart-healthy foods. Choose foods that are without trans fat and are low in saturated fat, cholesterol, and salt (sodium). This includes fresh or frozen fruits and vegetables, fish, lean meats, fat-free or low-fat dairy foods, whole grains, and high-fiber foods. Lentils and dried peas and beans (legumes) are also good choices. °· Limit salt if told by your doctor. °· Cook in a healthy way. Roast, grill, broil, bake, poach, steam, or stir-fry foods. °· Limit fluids as told by your doctor. °· Weigh yourself every morning. Do this after you pee (urinate) and before you eat breakfast. Write down your weight to give to your doctor. °· Take your blood pressure and write it down if your doctor tells you to. °· Ask your doctor how to check your pulse. Check your pulse as told. °· Lose weight if told by your doctor. °· Stop smoking or chewing tobacco. Do not use gum or patches that help you quit without your doctor's approval. °· Schedule and go to doctor visits as told. °· Nonpregnant women should have no more than 1 drink a day. Men should have no more than 2 drinks a day. Talk to your doctor about drinking alcohol. °· Stop illegal drug use. °· Stay current with shots (immunizations). °· Manage your health conditions as told by your  doctor. °· Learn to manage your stress. °· Rest when you are tired. °· If it is really hot outside: °? Avoid intense activities. °? Use air conditioning or fans, or get in a cooler place. °? Avoid caffeine and alcohol. °? Wear loose-fitting, lightweight, and light-colored clothing. °· If it is really cold outside: °? Avoid intense activities. °? Layer your clothing. °? Wear mittens or gloves, a hat, and a scarf when going outside. °? Avoid alcohol. °· Learn about heart failure and get support as needed. °· Get help to maintain or improve your quality of life and your ability to care for yourself as needed. °Contact a doctor if: °· You gain weight quickly. °· You are more short of breath than usual. °· You cannot do your normal activities. °· You tire easily. °· You cough more than normal, especially with activity. °· You have any or more puffiness (swelling) in areas such as your hands, feet, ankles, or belly (abdomen). °· You cannot sleep because it is hard to breathe. °· You feel like your heart is beating fast (palpitations). °· You get dizzy or light-headed when you stand up. °Get help right away if: °· You have trouble breathing. °· There is a change in mental status, such as becoming less alert or not being able to focus. °· You have chest pain or discomfort. °· You faint. °This information is not intended to replace advice given to you by your health care provider. Make sure you   discuss any questions you have with your health care provider. °Document Released: 01/02/2008 Document Revised: 08/31/2015 Document Reviewed: 05/11/2012 °Elsevier Interactive Patient Education © 2017 [employer] ° °

## 2017-04-20 NOTE — Progress Notes (Signed)
CRITICAL VALUE ALERT  Critical Value:  Troponin 0.05  Date & Time Notied:  04/20/17 1654  Provider Notified: Dr Nehemiah Settle   Orders Received/Actions taken: pending call back  Also made aware of 6 beat run VTach

## 2017-04-21 ENCOUNTER — Telehealth: Payer: Self-pay | Admitting: *Deleted

## 2017-04-21 NOTE — Telephone Encounter (Signed)
Transition Care Management Follow-up Telephone Call   Date discharged? 04/20/17   How have you been since you were released from the hospital? Pt states she os doing ok still have cough and congested   Do you understand why you were in the hospital? YES   Do you understand the discharge instructions? YES   Where were you discharged to? Home   Items Reviewed:  Medications reviewed: YES  Allergies reviewed: YES  Dietary changes reviewed: YES, heart healthy  Referrals reviewed: No referral needed   Functional Questionnaire:   Activities of Daily Living (ADLs):   She states she are independent in the following: bathing and hygiene, feeding, continence, grooming, toileting and dressing States they require assistance with the following: ambulation sometimes   Any transportation issues/concerns?: NO   Any patient concerns? NO   Confirmed importance and date/time of follow-up visits scheduled YES, appt 04/22/17  Provider Appointment booked with Dr. Sharlet Salina  Confirmed with patient if condition begins to worsen call PCP or go to the ER.  Patient was given the office number and encouraged to call back with question or concerns.  : YES

## 2017-04-22 ENCOUNTER — Encounter: Payer: Self-pay | Admitting: Cardiology

## 2017-04-22 ENCOUNTER — Other Ambulatory Visit (HOSPITAL_COMMUNITY): Payer: Self-pay | Admitting: *Deleted

## 2017-04-22 NOTE — Progress Notes (Signed)
Cardiology Office Note   Date:  04/24/2017   ID:  [name], DOB [birth year], MRN [id number]  PCP:  Martha Koch, MD  Cardiologist:   Martha Breeding, MD   Chief Complaint  Patient presents with  . Shortness of Breath      History of Present Illness: [name] is a 72 y.o. female who presents for evaluation of an abnormal EKG.  She has a LBBB. She did have an echocardiogram in 2014 which was essentially normal and she had a negative Lexiscan Myoview.   I saw her in May of this year for evaluation of a murmur.   I sent her for an echo and she had moderate AI.  She recently presented with dyspnea.  I reviewed the CXR results and she did not have edema.  However, her BNP was slightly elevated.  She was referred to see me.   I sent her for repeat echo and there was a small pericardial effusion but no evidence of tamponade and her AI was moderate with very mild stenosis and normal LV size and function.   Since I last saw her she was seen by Hematology for possible bone marrow disorder but it was not clear that she has a primary bone marrow issue and she is being followed for her anemia.  She was in the hospital in late October and early November for 7 days. She had some abdominal pain which was not of any clear etiology.  She was treated for an early pneumonia.  There was some mention of diastolic dysfunction.  She had a repeat echo which did not suggest any change in the small pericardial effusion.  She lost about 30 pounds during and since that admission.  I sent her for an ambulatory blood pressure monitor and she is consistently hypertensive with systolic hypertension particularly in the sleeping hours.  Her blood pressure is 150s-180.  She was in the hospital overnight earlier this month again for SOB and was diuresed.  I reviewed these records for this visit.  She had another echocardiogram.  This demonstrated that the pericardial effusion was now moderate and increased in size  compared to previous but there was no suggestion of tamponade physiology.  She was diuresed.  She has a cough which is nonproductive.  She is not describing chest pressure, neck or arm discomfort.  He does not notice any presyncope or syncope.  She is fatigued.  She has had chronic trouble lying flat and chronic lower extremity swelling.  She has symptoms consistent with reflux.   Past Medical History:  Diagnosis Date  . Acute pancreatitis 02/04/2017   [name] 02/04/2017  . Adenomatous colon polyp 01/2002  . Anemia, chronic disease    /notes 02/04/2017  . Aortic valve disease    AI/AS  . Arthritis    "lower back, knees" (02/04/2017)  . CHF (congestive heart failure) ([name])   . CKD (chronic kidney disease), stage III ([place])   . Diverticulosis of colon   . DJD (degenerative joint disease)   . Frequent UTI   . Hemorrhoids   . Hypertension   . Lumbar back pain   . Neuropathy   . Obesity   . Pericardial effusion     in a patient with Diastolic heart  failure and Pericardial effusion  known since last 2 D echo in 11/2016 /notes 02/04/2017  . PONV (postoperative nausea and vomiting)   . Pulmonary HTN ([place])   . Renal cyst   . Venous  insufficiency   . Vitamin D deficiency     Past Surgical History:  Procedure Laterality Date  . ABDOMINAL HYSTERECTOMY     "partial"  . COLONOSCOPY W/ BIOPSIES AND POLYPECTOMY     "bx was ok"     Current Outpatient Medications  Medication Sig Dispense Refill  . amLODipine (NORVASC) 10 MG tablet Take 1 tablet (10 mg total) by mouth daily. 30 tablet 0  . calcitRIOL (ROCALTROL) 0.25 MCG capsule Take 0.5 mcg by mouth daily.  0  . calcium carbonate (TUMS - DOSED IN MG ELEMENTAL CALCIUM) 500 MG chewable tablet Chew 2 tablets by mouth daily as needed for indigestion or heartburn.    . fluticasone (FLONASE) 50 MCG/ACT nasal spray Place 2 sprays daily into both nostrils. 16 g 2  . gabapentin (NEURONTIN) 300 MG capsule TAKE 1 CAPSULE(300 MG) BY MOUTH THREE TIMES  DAILY AS NEEDED FOR PAIN 90 capsule 0  . hydrALAZINE (APRESOLINE) 50 MG tablet TAKE 1 TABLET BY MOUTH THREE TIMES DAILY (Patient taking differently: TAKE 1 TABLET 50mg   BY MOUTH THREE TIMES DAILY) 270 tablet 0  . hydrocortisone (ANUSOL-HC) 2.5 % rectal cream Place 1 application rectally as needed. 30 g 0  . labetalol (NORMODYNE) 200 MG tablet TAKE 2 TABLETS BY MOUTH TWICE DAILY (Patient taking differently: take two tablets 400mg  total  twice  daily) 120 tablet 0  . losartan (COZAAR) 100 MG tablet Take 1 tablet (100 mg total) by mouth daily. 30 tablet 11  . ondansetron (ZOFRAN) 4 MG tablet Take 1 tablet (4 mg total) by mouth every 8 (eight) hours as needed for nausea or vomiting. 20 tablet 0  . pantoprazole (PROTONIX) 40 MG tablet Take 1 tablet (40 mg total) daily by mouth. 30 tablet 0  . PROCRIT 36629 UNIT/ML injection Inject 40,000 Units into the skin every 14 (fourteen) days.  3  . Propylene Glycol (SYSTANE BALANCE) 0.6 % SOLN Place 1 drop into both eyes daily as needed (for dry eyes).    Marland Kitchen spironolactone (ALDACTONE) 25 MG tablet Take 2 tablets (50 mg total) daily by mouth. 30 tablet 6  . torsemide (DEMADEX) 20 MG tablet TAKE 2 TABLETS BY MOUTH EVERY MORNING (Patient taking differently: TAKE 2 TABLETS 40mg  total BY MOUTH EVERY MORNING) 60 tablet 6  . traMADol (ULTRAM) 50 MG tablet Take 1 tablet (50 mg total) by mouth 3 (three) times daily as needed for severe pain. 15 tablet 0  . triamcinolone ointment (KENALOG) 0.5 % Apply 1 application topically 2 (two) times daily. 100 g 3   No current facility-administered medications for this visit.     Allergies:   Minoxidil and Naproxen    ROS:  Please see the history of present illness.   Otherwise, review of systems are none.   All other systems are reviewed and negative.    PHYSICAL EXAM: VS:  BP (!) 162/62   Pulse 83   Ht 5\' 6"  (1.676 m)   Wt 196 lb (88.9 kg)   SpO2 94%   BMI 31.64 kg/m  , BMI Body mass index is 31.64 kg/m.  GENERAL:   Well appearing NECK:  No jugular venous distention, waveform within normal limits, carotid upstroke brisk and symmetric, no bruits, no thyromegaly LUNGS:  Clear to auscultation bilaterally CHEST:  Unremarkable HEART:  PMI not displaced or sustained,S1 and S2 within normal limits, no S3, no S4, no clicks, possible rubs, 2 out of 6 radiating slightly at the aortic outflow tract, 2 out of 6 apical diastolic  murmur ABD:  Flat, positive bowel sounds normal in frequency in pitch, no bruits, no rebound, no guarding, no midline pulsatile mass, no hepatomegaly, no splenomegaly EXT:  2 plus pulses throughout, mild to moderate bilateral lower extremity edema, no cyanosis no clubbing'    EKG:  EKG is not ordered today.    Recent Labs: 11/27/2016: Pro B Natriuretic peptide (BNP) 202.0 03/26/2017: ALT 25 04/19/2017: B Natriuretic Peptide 766.4; Hemoglobin 10.0; Platelets 199 04/20/2017: BUN 22; Creatinine, Ser 2.00; Magnesium 1.7; Potassium 3.4; Sodium 143    Lipid Panel    Component Value Date/Time   CHOL 141 02/04/2017 2101   TRIG 78 02/04/2017 2101   HDL 49 02/04/2017 2101   CHOLHDL 2.9 02/04/2017 2101   VLDL 16 02/04/2017 2101   LDLCALC 76 02/04/2017 2101      Wt Readings from Last 3 Encounters:  04/24/17 196 lb (88.9 kg)  04/23/17 204 lb (92.5 kg)  04/20/17 202 lb 13.2 oz (92 kg)      Other studies Reviewed: Additional studies/ records that were reviewed today include:   Hospital admission records.  Review of the above records demonstrates:   See above  ASSESSMENT AND PLAN:  DYSPNEA:    This seems to be at baseline.  She has some chronic lung issues. She is going to see a pulmonologist.  I am going to continue current therapy and repeat an echo limited next month to follow the effusion.  I will schedule a right heart cath after this.  For now she will continue the current medical therapy.  Other issues are addressed as below.   AI:  I will follow this with echos.    CAROTID  STENOSIS:    She had right 40% stenosis in 2017 and will need repeat Doppler this year.    HTN: She is on maximum medical therapy and I am not suggesting that I want to start another med.  I am going to check a sleep study to make sure she does not have sleep apnea.  She is seeing renal and has had that evaluation for secondary causes of hypertension.  This certainly may be contributing to her moderate LVH.  I am going to check an SPEP.  Check a basic metabolic profile today.  PERICARDIAL EFFUSION:  This will be checked as above.  CKD:  Check a BMET today.  LVH: Her son did die from HOCM.  In order to evaluate for possible phenocopy assoc with LVH, will check SPEP and consider genetic testing for HCM panel and PYP scan for TTR-WT amyloidosis.  I do not think that she is a good candidate for an MRI with her renal insufficiency.       Current medicines are reviewed at length with the patient today.  The patient does not have concerns regarding medicines.  The following changes have been made:   None  Labs/ tests ordered today include:   None  Orders Placed This Encounter  Procedures  . Protein Electrophoresis, (serum)  . Basic Metabolic Panel (BMET)  . ECHOCARDIOGRAM COMPLETE  . Split night study     Disposition:   FU with me in 1 month.    Signed, Martha Breeding, MD  04/24/2017 4:40 PM    [facility]

## 2017-04-23 ENCOUNTER — Ambulatory Visit (HOSPITAL_COMMUNITY)
Admission: RE | Admit: 2017-04-23 | Discharge: 2017-04-23 | Disposition: A | Discharge status: Home / Self Care | Payer: Medicare Other | Source: Ambulatory Visit | Attending: Nephrology | Admitting: Nephrology

## 2017-04-23 DIAGNOSIS — D631 Anemia in chronic kidney disease: Secondary | ICD-10-CM | POA: Diagnosis not present

## 2017-04-23 DIAGNOSIS — N189 Chronic kidney disease, unspecified: Secondary | ICD-10-CM | POA: Diagnosis not present

## 2017-04-23 MED ORDER — SODIUM CHLORIDE 0.9 % IV SOLN
510.0000 mg | Freq: Once | INTRAVENOUS | Status: AC
  Administered 2017-04-23: 510 mg via INTRAVENOUS
  Filled 2017-04-23: qty 17

## 2017-04-24 ENCOUNTER — Encounter: Payer: Self-pay | Admitting: Cardiology

## 2017-04-24 ENCOUNTER — Ambulatory Visit: Payer: Medicare Other | Admitting: Cardiology

## 2017-04-24 VITALS — BP 162/62 | HR 83 | Ht 66.0 in | Wt 196.0 lb

## 2017-04-24 DIAGNOSIS — R5383 Other fatigue: Secondary | ICD-10-CM

## 2017-04-24 DIAGNOSIS — I3139 Other pericardial effusion (noninflammatory): Secondary | ICD-10-CM

## 2017-04-24 DIAGNOSIS — I272 Pulmonary hypertension, unspecified: Secondary | ICD-10-CM

## 2017-04-24 DIAGNOSIS — Z79899 Other long term (current) drug therapy: Secondary | ICD-10-CM | POA: Diagnosis not present

## 2017-04-24 DIAGNOSIS — I313 Pericardial effusion (noninflammatory): Secondary | ICD-10-CM

## 2017-04-24 NOTE — Patient Instructions (Signed)
Medication Instructions:  Continue current medications  If you need a refill on your cardiac medications before your next appointment, please call your pharmacy.  Labwork: BMP and SPEP today  Testing/Procedures: Your physician has requested that you have an echocardiogram in 1 Month. Echocardiography is a painless test that uses sound waves to create images of your heart. It provides your doctor with information about the size and shape of your heart and how well your heart's chambers and valves are working. This procedure takes approximately one hour. There are no restrictions for this procedure.  Your physician has recommended that you have a sleep study. This test records several body functions during sleep, including: brain activity, eye movement, oxygen and carbon dioxide blood levels, heart rate and rhythm, breathing rate and rhythm, the flow of air through your mouth and nose, snoring, body muscle movements, and chest and belly movement.   Follow-Up: Your physician wants you to follow-up in: 1 Month.     Thank you for choosing [facility]!!

## 2017-04-25 ENCOUNTER — Telehealth: Payer: Self-pay | Admitting: Cardiology

## 2017-04-25 ENCOUNTER — Telehealth: Payer: Self-pay | Admitting: *Deleted

## 2017-04-25 DIAGNOSIS — N289 Disorder of kidney and ureter, unspecified: Secondary | ICD-10-CM

## 2017-04-25 NOTE — Telephone Encounter (Signed)
F/u Message  pt returning RN call about lab results.

## 2017-04-25 NOTE — Telephone Encounter (Signed)
Spoke with pt, aware of lab results and will come by next week for repeat.

## 2017-04-25 NOTE — Telephone Encounter (Signed)
Left message for patient regarding date and time for Sleep Study--05/13/17 @ 8pm.  Also informed patient I will mail paperwork to her that will include appt date, time and phone number if she needs to reschedule.

## 2017-04-28 ENCOUNTER — Telehealth: Payer: Self-pay | Admitting: *Deleted

## 2017-04-28 DIAGNOSIS — Z79899 Other long term (current) drug therapy: Secondary | ICD-10-CM

## 2017-04-28 NOTE — Telephone Encounter (Signed)
-----   Message from Minus Breeding, MD sent at 04/25/2017  7:58 AM EST ----- Creat is up.  Repeat mid next week.  Call Ms. Willadsen with the results and send results to Hoyt Koch, MD

## 2017-04-28 NOTE — Telephone Encounter (Signed)
Pt aware of her blood work, pt will be coming in this week to have blood work done this week

## 2017-04-29 ENCOUNTER — Other Ambulatory Visit (INDEPENDENT_AMBULATORY_CARE_PROVIDER_SITE_OTHER): Payer: Medicare Other

## 2017-04-29 ENCOUNTER — Ambulatory Visit (INDEPENDENT_AMBULATORY_CARE_PROVIDER_SITE_OTHER): Payer: Medicare Other | Admitting: Internal Medicine

## 2017-04-29 ENCOUNTER — Encounter: Payer: Self-pay | Admitting: Internal Medicine

## 2017-04-29 VITALS — BP 150/50 | HR 88 | Temp 97.7°F | Ht 66.0 in | Wt 196.0 lb

## 2017-04-29 DIAGNOSIS — N183 Chronic kidney disease, stage 3 unspecified: Secondary | ICD-10-CM

## 2017-04-29 DIAGNOSIS — J9601 Acute respiratory failure with hypoxia: Secondary | ICD-10-CM | POA: Diagnosis not present

## 2017-04-29 DIAGNOSIS — I129 Hypertensive chronic kidney disease with stage 1 through stage 4 chronic kidney disease, or unspecified chronic kidney disease: Secondary | ICD-10-CM

## 2017-04-29 DIAGNOSIS — N184 Chronic kidney disease, stage 4 (severe): Secondary | ICD-10-CM

## 2017-04-29 DIAGNOSIS — D631 Anemia in chronic kidney disease: Secondary | ICD-10-CM | POA: Diagnosis not present

## 2017-04-29 DIAGNOSIS — I5033 Acute on chronic diastolic (congestive) heart failure: Secondary | ICD-10-CM

## 2017-04-29 LAB — BASIC METABOLIC PANEL
BUN / CREAT RATIO: 11 — AB (ref 12–28)
BUN: 30 mg/dL — AB (ref 8–27)
CALCIUM: 10 mg/dL (ref 8.7–10.3)
CHLORIDE: 102 mmol/L (ref 96–106)
CO2: 30 mmol/L — ABNORMAL HIGH (ref 20–29)
CREATININE: 2.81 mg/dL — AB (ref 0.57–1.00)
GFR, EST AFRICAN AMERICAN: 19 mL/min/{1.73_m2} — AB (ref >59)
GFR, EST NON AFRICAN AMERICAN: 16 mL/min/{1.73_m2} — AB (ref >59)
Glucose: 105 mg/dL — ABNORMAL HIGH (ref 65–99)
Potassium: 3.9 mmol/L (ref 3.5–5.2)
Sodium: 148 mmol/L — ABNORMAL HIGH (ref 134–144)

## 2017-04-29 LAB — RENAL FUNCTION PANEL
ALBUMIN: 3.5 g/dL (ref 3.5–5.2)
BUN: 43 mg/dL — ABNORMAL HIGH (ref 6–23)
CALCIUM: 10.2 mg/dL (ref 8.4–10.5)
CHLORIDE: 102 meq/L (ref 96–112)
CO2: 36 mEq/L — ABNORMAL HIGH (ref 19–32)
Creatinine, Ser: 2.28 mg/dL — ABNORMAL HIGH (ref 0.40–1.20)
GFR: 27.11 mL/min — ABNORMAL LOW (ref >60.00)
Glucose, Bld: 113 mg/dL — ABNORMAL HIGH (ref 70–99)
POTASSIUM: 3.5 meq/L (ref 3.5–5.1)
Phosphorus: 2.9 mg/dL (ref 2.3–4.6)
Sodium: 146 mEq/L — ABNORMAL HIGH (ref 135–145)

## 2017-04-29 LAB — PROTEIN ELECTROPHORESIS, SERUM
A/G RATIO SPE: 0.8 (ref 0.7–1.7)
ALBUMIN ELP: 2.8 g/dL — AB (ref 2.9–4.4)
ALPHA 1: 0.3 g/dL (ref 0.0–0.4)
ALPHA 2: 0.8 g/dL (ref 0.4–1.0)
BETA: 1.1 g/dL (ref 0.7–1.3)
GAMMA GLOBULIN: 1.4 g/dL (ref 0.4–1.8)
Globulin, Total: 3.7 g/dL (ref 2.2–3.9)
Total Protein: 6.5 g/dL (ref 6.0–8.5)

## 2017-04-29 NOTE — Progress Notes (Signed)
   Subjective:    Patient ID: [name], female    DOB: [birth year], 72 y.o.   MRN: [id number]  HPI The patient is a 72 YO female coming in for hospital follow up (she was in with systolic heart failure and had diuresis, she was followed with echo for pericardial effusion, this has increased in size over the last year). 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 She saw cardiology and they did some lab work which was not revealing but had worsening kidney function. She denies feeling poorly since leaving the hospital. She still does not have much energy and gets tired with little activity. She is scheduled for echo next month and then plans for right heart cath and sleep study to evaluate her blood pressure and effusion.   PMH, [facility], social history reviewed and updated  Labs to [facility].   Review of Systems  Constitutional: Positive for activity change, appetite change and fatigue.  HENT: Negative.   Eyes: Negative.   Respiratory: Positive for cough and shortness of breath. Negative for chest tightness.   Cardiovascular: Negative for chest pain, palpitations and leg swelling.  Gastrointestinal: Negative for abdominal distention, abdominal pain, constipation, diarrhea, nausea and vomiting.  Musculoskeletal: Negative.   Skin: Negative.   Neurological: Positive for weakness.  Psychiatric/Behavioral: Negative.       Objective:   Physical Exam  Constitutional: She is oriented to person, place, and time. She appears well-developed and well-nourished.  HENT:  Head: Normocephalic and atraumatic.  Eyes: EOM are normal.  Neck: Normal range of motion. No thyromegaly present.  Cardiovascular: Normal rate and regular rhythm.  Pulmonary/Chest: Effort normal and breath sounds normal. No respiratory distress. She has no wheezes. She has no rales.  Abdominal: Soft.  Lymphadenopathy:    She has no cervical  adenopathy.  Neurological: She is alert and oriented to person, place, and time.  Skin: Skin is warm and dry.   Vitals:   04/29/17 0928  BP: (!) 150/50  Pulse: 88  Temp: 97.7 F (36.5 C)  TempSrc: Oral  SpO2: 98%  Weight: 196 lb (88.9 kg)  Height: 5\' 6"  (1.676 m)      Assessment & Plan:

## 2017-04-29 NOTE — Patient Instructions (Addendum)
We will check the labs today and call you back about the results and send it to the other doctors.   We will have you wait to see the pulmonary doctor to see if the inhaler would help.

## 2017-04-30 ENCOUNTER — Telehealth: Payer: Self-pay | Admitting: Internal Medicine

## 2017-04-30 NOTE — Telephone Encounter (Signed)
Patient wants to make sure that her most recent labs get sent to her doctors: Dr Percival Spanish and Dr Deterding.  Please forward her labs to them.

## 2017-04-30 NOTE — Telephone Encounter (Signed)
Forwarded labs to the doctors

## 2017-05-01 ENCOUNTER — Institutional Professional Consult (permissible substitution): Payer: Medicare Other | Admitting: Emergency Medicine

## 2017-05-02 NOTE — Assessment & Plan Note (Signed)
Recheck Creatinine today as most recent at cardiology was increased from her baseline.

## 2017-05-02 NOTE — Assessment & Plan Note (Addendum)
Hypoxia is resolved. Weight is stable at home since discharge but she still has a lot of dyspnea. Some of this is likely related to the moderate pericardial effusion. She will get another echo in 1 month to follow and possible right heart cath for pressures. Given sample of breo to use to see if this helps her SOB significantly. Will follow up with pulmonary as well.

## 2017-05-02 NOTE — Assessment & Plan Note (Signed)
Blood counts in the hospital are actually at goal of Hg around 10 in CKD. She has not had procrit for about 1 month and will resume. Given her cardiac disease is reasonable to push for a slightly higher goal Hg.

## 2017-05-08 ENCOUNTER — Ambulatory Visit: Payer: Medicare Other | Admitting: Cardiology

## 2017-05-08 DIAGNOSIS — I5033 Acute on chronic diastolic (congestive) heart failure: Secondary | ICD-10-CM | POA: Diagnosis not present

## 2017-05-08 DIAGNOSIS — N2581 Secondary hyperparathyroidism of renal origin: Secondary | ICD-10-CM | POA: Diagnosis not present

## 2017-05-08 DIAGNOSIS — I129 Hypertensive chronic kidney disease with stage 1 through stage 4 chronic kidney disease, or unspecified chronic kidney disease: Secondary | ICD-10-CM | POA: Diagnosis not present

## 2017-05-08 DIAGNOSIS — D631 Anemia in chronic kidney disease: Secondary | ICD-10-CM | POA: Diagnosis not present

## 2017-05-08 DIAGNOSIS — N184 Chronic kidney disease, stage 4 (severe): Secondary | ICD-10-CM | POA: Diagnosis not present

## 2017-05-08 LAB — BASIC METABOLIC PANEL
BUN: 52 — AB (ref 4–21)
Creatinine: 2.8 — AB (ref 0.5–1.1)
Glucose: 115
POTASSIUM: 3.4 (ref 3.4–5.3)
SODIUM: 145 (ref 137–147)

## 2017-05-13 ENCOUNTER — Encounter (HOSPITAL_BASED_OUTPATIENT_CLINIC_OR_DEPARTMENT_OTHER): Payer: Medicare Other

## 2017-05-13 ENCOUNTER — Telehealth: Payer: Self-pay | Admitting: Cardiology

## 2017-05-13 NOTE — Telephone Encounter (Signed)
LMTCB

## 2017-05-13 NOTE — Telephone Encounter (Signed)
Pt c/o Shortness Of Breath: STAT if SOB developed within the last 24 hours or pt is noticeably SOB on the phone  1. Are you currently SOB (can you hear that pt is SOB on the phone)? no  2. How long have you been experiencing SOB? All the time but yesterday its more   3. Are you SOB when sitting or when up moving around? both 4. Are you currently experiencing any other symptoms?  Stuffed up, cough

## 2017-05-13 NOTE — Telephone Encounter (Signed)
Patient called with MD advice. She agrees w/plan to cancel sleep study. She is using Breo now and thinks it helped some. Suggested that she notify her MD if the inhaler is helping her symptoms.

## 2017-05-13 NOTE — Telephone Encounter (Signed)
Returned call to patient of Dr. Percival Spanish who c/o SOB. She has had shortness of breath, but it is worse yesterday and today. She has a cough and it feels like she has something to cough up but she can't get it up. She has been using "nose drops" that helps open her head up. She states she did not rest a lot last night, she would wake up like she "couldn't half breathe". She takes torsemide as prescribed and has pretty good UOP. She denies weight gain. She denies swelling. Her PCP gave her a sample of Breo to use for SOB but she has not taken this. Advised she should try this to see if her symptoms improve.   She is having to clear her throat often while on the phone She has a sleep study tonight and echo next week.  She has an appt with Dr. Lamonte Sakai (pulmonary).  She would like recommendations for what she could take for chest congestion, nasal congestion.

## 2017-05-13 NOTE — Telephone Encounter (Signed)
I would suggest possibly cancelling the sleep study if she is coughing and cannot sleep.  She need follow up with pulm and primary care and see [place] if it is felt to be a cardiac issue after she sees them.

## 2017-05-14 ENCOUNTER — Telehealth: Payer: Self-pay | Admitting: Cardiology

## 2017-05-14 ENCOUNTER — Encounter: Payer: Self-pay | Admitting: Internal Medicine

## 2017-05-14 ENCOUNTER — Ambulatory Visit: Payer: Medicare Other | Admitting: Internal Medicine

## 2017-05-14 VITALS — BP 122/58 | HR 83 | Ht 66.0 in | Wt 197.0 lb

## 2017-05-14 DIAGNOSIS — R0602 Shortness of breath: Secondary | ICD-10-CM

## 2017-05-14 DIAGNOSIS — R05 Cough: Secondary | ICD-10-CM | POA: Diagnosis not present

## 2017-05-14 DIAGNOSIS — R053 Chronic cough: Secondary | ICD-10-CM

## 2017-05-14 LAB — NITRIC OXIDE: Nitric Oxide: 22

## 2017-05-14 NOTE — Telephone Encounter (Signed)
Patient saw Dr. Chase Caller today and wants Dr. Percival Spanish to review the office note form today. Informed patient that her message would be sent to Dr. Percival Spanish. Patient verbalized understanding.

## 2017-05-14 NOTE — Progress Notes (Signed)
Subjective:     Patient ID: [name], female   DOB: 19-Apr-1945, 72 y.o.   MRN: [id number]  PCP Hoyt Koch, MD   HPI  IOV 05/14/2017  Chief Complaint  Patient presents with  . Advice Only    Pt stated she had pna in November 2018. Pt has complaints of SOB, coughing, and chest tightness off and on for 3 months.   [name] is here for a new visit.  She is a very rambling historian.  She used to see Dr. Nicki Reaper and in our practice over 5 years ago.  Even at the time she had multiple medical problems and was noted to be a very rambling historian.  As best as I can gather she has had insidious onset of shortness of breath for 3 months or so that is progressive.  In fact today walking desaturation test 185 feet x3 laps on room air: She only walked 1 lap.  Resting pulse ox was 98%.  Final pulse ox was 90% heart rate jumped from 78-91.  She stopped because of dyspnea and fatigue.  She could not do anything anymore.  She is also having a cough.  She has had 2 admissions according to history one was in October for pneumonia which is when she thinks her shortness of breath started.  The second 1 in January for acute on chronic diastolic heart failure which I reviewed the chart.  Most recent chest x-ray April 19, 2017 as reported clear that I personally visualized.  Her echocardiogram on April 20, 2017 in the hospital showed grade 1 diastolic dysfunction and aortic regurgitation.  She also has cough but no specific wheezing.  Apparently yesterday primary care physician gave her Brio steroid long-acting beta agonist inhaler for the first time and this seemed to help.  The question is if she has pulmonary issues going on.  Review of the chart does not show she has had pulmonary function test or CT chest.  She is known to have chronic kidney disease with most recent creatinine 2.28 mg percent April 29, 2017 and mild anemia with a hemoglobin 10 g% April 19, 2017. feNO 05/14/2017 - 22 pbb and  normal     has a past medical history of Acute pancreatitis (02/04/2017), Adenomatous colon polyp (01/2002), Anemia, chronic disease, Aortic valve disease, Arthritis, CHF (congestive heart failure) ([place]), CKD (chronic kidney disease), stage III ([place]), Diverticulosis of colon, DJD (degenerative joint disease), Frequent UTI, Hemorrhoids, Hypertension, Lumbar back pain, Neuropathy, Obesity, Pericardial effusion, PONV (postoperative nausea and vomiting), Pulmonary HTN ([place]), Renal cyst, Venous insufficiency, and Vitamin D deficiency.   reports that she quit smoking about 35 years ago. Her smoking use included cigarettes. She has a 6.00 pack-year smoking history. she has never used smokeless tobacco.  Past Surgical History:  Procedure Laterality Date  . ABDOMINAL HYSTERECTOMY     "partial"  . COLONOSCOPY W/ BIOPSIES AND POLYPECTOMY     "bx was ok"    Allergies  Allergen Reactions  . Minoxidil Palpitations and Other (See Comments)    unable to sleep  . Naproxen Swelling    Immunization History  Administered Date(s) Administered  . Influenza Split 01/10/2011, 01/10/2012  . Influenza, High Dose Seasonal PF 01/22/2017  . Influenza,inj,Quad PF,6+ Mos 01/20/2014  . Pneumococcal Polysaccharide-23 05/25/2014  . Tdap 05/25/2014    Family History  Problem Relation Age of Onset  . Diabetes Mother   . Colon polyps Mother   . Hypertension Mother   . Hypertension  Brother   . Hypertension Sister   . Colon cancer Neg Hx      Current Outpatient Medications:  .  amLODipine (NORVASC) 10 MG tablet, Take 1 tablet (10 mg total) by mouth daily., Disp: 30 tablet, Rfl: 0 .  calcitRIOL (ROCALTROL) 0.25 MCG capsule, Take 0.5 mcg by mouth daily., Disp: , Rfl: 0 .  calcium carbonate (TUMS - DOSED IN MG ELEMENTAL CALCIUM) 500 MG chewable tablet, Chew 2 tablets by mouth daily as needed for indigestion or heartburn., Disp: , Rfl:  .  fluticasone (FLONASE) 50 MCG/ACT nasal spray, Place 2 sprays daily into  both nostrils., Disp: 16 g, Rfl: 2 .  gabapentin (NEURONTIN) 300 MG capsule, TAKE 1 CAPSULE(300 MG) BY MOUTH THREE TIMES DAILY AS NEEDED FOR PAIN, Disp: 90 capsule, Rfl: 0 .  hydrALAZINE (APRESOLINE) 50 MG tablet, TAKE 1 TABLET BY MOUTH THREE TIMES DAILY (Patient taking differently: TAKE 1 TABLET 50mg   BY MOUTH THREE TIMES DAILY), Disp: 270 tablet, Rfl: 0 .  hydrocortisone (ANUSOL-HC) 2.5 % rectal cream, Place 1 application rectally as needed., Disp: 30 g, Rfl: 0 .  labetalol (NORMODYNE) 200 MG tablet, TAKE 2 TABLETS BY MOUTH TWICE DAILY (Patient taking differently: take two tablets 400mg  total  twice  daily), Disp: 120 tablet, Rfl: 0 .  losartan (COZAAR) 100 MG tablet, Take 1 tablet (100 mg total) by mouth daily., Disp: 30 tablet, Rfl: 11 .  ondansetron (ZOFRAN) 4 MG tablet, Take 1 tablet (4 mg total) by mouth every 8 (eight) hours as needed for nausea or vomiting., Disp: 20 tablet, Rfl: 0 .  pantoprazole (PROTONIX) 40 MG tablet, Take 1 tablet (40 mg total) daily by mouth., Disp: 30 tablet, Rfl: 0 .  Propylene Glycol (SYSTANE BALANCE) 0.6 % SOLN, Place 1 drop into both eyes daily as needed (for dry eyes)., Disp: , Rfl:  .  spironolactone (ALDACTONE) 25 MG tablet, Take 2 tablets (50 mg total) daily by mouth., Disp: 30 tablet, Rfl: 6 .  torsemide (DEMADEX) 20 MG tablet, TAKE 2 TABLETS BY MOUTH EVERY MORNING (Patient taking differently: TAKE 2 TABLETS 40mg  total BY MOUTH EVERY MORNING), Disp: 60 tablet, Rfl: 6 .  traMADol (ULTRAM) 50 MG tablet, Take 1 tablet (50 mg total) by mouth 3 (three) times daily as needed for severe pain., Disp: 15 tablet, Rfl: 0 .  triamcinolone ointment (KENALOG) 0.5 %, Apply 1 application topically 2 (two) times daily., Disp: 100 g, Rfl: 3 .  PROCRIT 64403 UNIT/ML injection, Inject 40,000 Units into the skin every 14 (fourteen) days., Disp: , Rfl: 3    Review of Systems     Objective:   Physical Exam Vitals:   05/14/17 0914  BP: (!) 122/58  Pulse: 83  SpO2: 98%   Weight: 197 lb (89.4 kg)  Height: 5\' 6"  (1.676 m)    Estimated body mass index is 31.8 kg/m as calculated from the following:   Height as of this encounter: 5\' 6"  (1.676 m).   Weight as of this encounter: 197 lb (89.4 kg).  General Appearance:    Looks OBESE - +  Head:    Normocephalic, without obvious abnormality, atraumatic  Eyes:    PERRL - yes, conjunctiva/corneas - clear      Ears:    Normal external ear canals, both ears  Nose:   NG tube - no  Throat:  ETT TUBE - no , OG tube - no  Neck:   Supple,  No enlargement/tenderness/nodules     Lungs:  Clear to auscultation bilaterally  Chest wall:    No deformity  Heart:    S1 and S2 normal, ES murmur +, CVP - no.  Pressors - no  Abdomen:     Soft, no masses, no organomegaly  Genitalia:    Not done  Rectal:   not done  Extremities:   Extremities- intact     Skin:   Intact in exposed areas .     Neurologic:   Sedation - none -> RASS - +1 . Moves all 4s - yes. CAM-ICU - neg . Orientation - x3+         Assessment:       ICD-10-CM   1. Shortness of breath R06.02   2. Chronic cough R05        Plan:      I think symptoms are related to heart muscle stiffness and deconditioning You do not have asthma based on test 05/14/2017 But need to rule out  Other pulmonary issues  Plan - HRCT supine and prone - ONO test  -Hold off on breo for now - Pre-bd spiro and dlco only. No lung volume or bd response. No post-bd spiro  Followup  - return to see APP after above tests    Dr. Brand Males, M.D., [facility].C.P Pulmonary and Critical Care Medicine Staff Physician, [place] Director - Interstitial Lung Disease  Program  Pulmonary [place] [facility] [place], [place], [id number]  Pager: [phone], If no answer or between  15:00h - 7:00h: call [phone] Telephone: [phone]

## 2017-05-14 NOTE — Progress Notes (Signed)
   Subjective:    Patient ID: Anthonette Lesage, female    DOB: [birth year], 72 y.o.   MRN: [id number]  HPI    Review of Systems  Constitutional: Negative for fever and unexpected weight change.  HENT: Positive for congestion and postnasal drip. Negative for dental problem, ear pain, nosebleeds, rhinorrhea, sinus pressure, sneezing, sore throat and trouble swallowing.   Eyes: Positive for itching. Negative for redness.  Respiratory: Positive for cough, chest tightness, shortness of breath and wheezing.   Cardiovascular: Positive for leg swelling. Negative for palpitations.  Gastrointestinal: Negative for nausea and vomiting.  Genitourinary: Negative for dysuria.  Musculoskeletal: Negative for joint swelling.  Skin: Negative for rash.  Allergic/Immunologic: Negative.  Negative for environmental allergies, food allergies and immunocompromised state.  Neurological: Negative for headaches.  Hematological: Does not bruise/bleed easily.  Psychiatric/Behavioral: Negative for dysphoric mood. The patient is not nervous/anxious.        Objective:   Physical Exam        Assessment & Plan:

## 2017-05-14 NOTE — Telephone Encounter (Signed)
Pt saw her Pulmonary doctor today and she wants to talk to youu about her office visit with him today.

## 2017-05-14 NOTE — Patient Instructions (Signed)
ICD-10-CM   1. Shortness of breath R06.02   2. Chronic cough R05     I think symptoms are related to heart muscle stiffness and deconditioning You do not have asthma based on test 05/14/2017 But need to rule out  Other pulmonary issues  Plan - HRCT supine and prone - ONO test  -Hold off on breo for now - Pre-bd spiro and dlco only. No lung volume or bd response. No post-bd spiro  Followup  - return to see APP after above tests

## 2017-05-17 NOTE — Telephone Encounter (Signed)
I reviewed the pulmonary note and I will wait for them to complete their work up.

## 2017-05-19 ENCOUNTER — Telehealth: Payer: Self-pay | Admitting: Cardiology

## 2017-05-19 NOTE — Telephone Encounter (Signed)
New Message   Pt c/o Shortness Of Breath: STAT if SOB developed within the last 24 hours or pt is noticeably SOB on the phone  1. Are you currently SOB (can you hear that pt is SOB on the phone)? Yes, slight SOB noticed on the phone   2. How long have you been experiencing SOB? Been awhile   3. Are you SOB when sitting or when up moving around? She has SOB whether or not she is sitting or standing   4. Are you currently experiencing any other symptoms? Coughing has picked up

## 2017-05-19 NOTE — Telephone Encounter (Signed)
Spoke with patient and she stated she is having a time with her shortness of breath. She gets short of breath even without exertion. She was given Brio by her PCP for her breathing and used it once and it seemed to help. When she saw her pulmonologist 05/14/17 he advised her not to use anymore. She is not having any increased swelling and is taking diuretics as prescribed. She continues to struggle and wants to know if there is anything Dr Percival Spanish can give her to help with her breathing. She has CT and Echo scheduled for Thursday which she has all intentions of keeping just feels like she needs something before then. Did explain Dr Percival Spanish had reviewed recent pulmonary visit and wanted to await for  upcoming tests results but she requested I reach out to him again. Will forward to Dr Percival Spanish for review

## 2017-05-20 ENCOUNTER — Telehealth: Payer: Self-pay | Admitting: Cardiology

## 2017-05-20 DIAGNOSIS — R0902 Hypoxemia: Secondary | ICD-10-CM | POA: Diagnosis not present

## 2017-05-20 DIAGNOSIS — J449 Chronic obstructive pulmonary disease, unspecified: Secondary | ICD-10-CM | POA: Diagnosis not present

## 2017-05-20 NOTE — Telephone Encounter (Signed)
She has had dyspnea which has been multifactorial.  Is been difficult to sort out and has not responded effectively to diuretics often.  I thought about her right and left heart cath if her symptoms progressed.  She likely should come in for a next available APP appt to evaluate and to consider further testing.

## 2017-05-20 NOTE — Telephone Encounter (Signed)
Left message to call back  

## 2017-05-20 NOTE — Telephone Encounter (Signed)
Received call back from patient,  Patient is returning call in regards to phone note yesterday.   States she is waiting to hear back from Dr. Percival Spanish.    States she has had increased SOB since over the weekend, at rest and with exertion.  States she cannot lay flat to sleep, only has slight increase in swelling in LE from usual.   Patient is SOB speaking on phone, but still able to speak in full sentences.   States she is also experiencing a cough, productive at times (clear). No fever/chills/body aches.   No sick contacts.   States she is taking torsemide 40mg  in the AM and spironolactone 50 daily as prescribed.  Does not check her weight at home.   States she has upcoming test this week but is concerned about the SOB now.      Advised I would send Dr. Percival Spanish and primary to review today and will call back with recommendations.    Patient verbalized understanding.

## 2017-05-20 NOTE — Telephone Encounter (Signed)
Left message to call back in the AM-also made aware of on call service overnight if needed.

## 2017-05-20 NOTE — Telephone Encounter (Signed)
Follow up     Pt c/o Shortness Of Breath: STAT if SOB developed within the last 24 hours or pt is noticeably SOB on the phone  1. Are you currently SOB (can you hear that pt is SOB on the phone)?  A little   2. How long have you been experiencing SOB? She had it all weekend it comes and goes, this morning was really bad   3. Are you SOB when sitting or when up moving around? Both , its worse when moving   4. Are you currently experiencing any other symptoms? Has a cough

## 2017-05-21 NOTE — Telephone Encounter (Signed)
appt made for pt on Friday February 15th @ 9:00 am, pt made aware of appt date and time

## 2017-05-21 NOTE — Telephone Encounter (Signed)
appt made 02/15 @ 9:00 am

## 2017-05-22 ENCOUNTER — Ambulatory Visit
Admission: RE | Admit: 2017-05-22 | Discharge: 2017-05-22 | Disposition: A | Discharge status: Home / Self Care | Payer: Medicare Other | Source: Ambulatory Visit | Attending: Internal Medicine | Admitting: Internal Medicine

## 2017-05-22 ENCOUNTER — Other Ambulatory Visit: Payer: Self-pay

## 2017-05-22 ENCOUNTER — Telehealth: Payer: Self-pay | Admitting: Internal Medicine

## 2017-05-22 ENCOUNTER — Inpatient Hospital Stay (HOSPITAL_COMMUNITY)
Admission: EM | Admit: 2017-05-22 | Discharge: 2017-06-16 | DRG: 219 | Disposition: A | Discharge status: Home Health | Payer: Medicare Other | Attending: Cardiothoracic Surgery | Admitting: Cardiothoracic Surgery

## 2017-05-22 ENCOUNTER — Encounter (HOSPITAL_COMMUNITY): Payer: Self-pay

## 2017-05-22 ENCOUNTER — Emergency Department (HOSPITAL_COMMUNITY): Payer: Medicare Other

## 2017-05-22 ENCOUNTER — Encounter (HOSPITAL_COMMUNITY): Payer: Self-pay | Admitting: *Deleted

## 2017-05-22 ENCOUNTER — Ambulatory Visit (HOSPITAL_BASED_OUTPATIENT_CLINIC_OR_DEPARTMENT_OTHER): Payer: Medicare Other

## 2017-05-22 ENCOUNTER — Other Ambulatory Visit: Payer: Self-pay | Admitting: Cardiology

## 2017-05-22 DIAGNOSIS — I7101 Dissection of ascending aorta: Secondary | ICD-10-CM

## 2017-05-22 DIAGNOSIS — D631 Anemia in chronic kidney disease: Secondary | ICD-10-CM | POA: Diagnosis not present

## 2017-05-22 DIAGNOSIS — I5031 Acute diastolic (congestive) heart failure: Secondary | ICD-10-CM | POA: Diagnosis not present

## 2017-05-22 DIAGNOSIS — I129 Hypertensive chronic kidney disease with stage 1 through stage 4 chronic kidney disease, or unspecified chronic kidney disease: Secondary | ICD-10-CM | POA: Insufficient documentation

## 2017-05-22 DIAGNOSIS — I5033 Acute on chronic diastolic (congestive) heart failure: Secondary | ICD-10-CM | POA: Diagnosis not present

## 2017-05-22 DIAGNOSIS — I712 Thoracic aortic aneurysm, without rupture: Secondary | ICD-10-CM | POA: Diagnosis not present

## 2017-05-22 DIAGNOSIS — N179 Acute kidney failure, unspecified: Secondary | ICD-10-CM | POA: Diagnosis not present

## 2017-05-22 DIAGNOSIS — N189 Chronic kidney disease, unspecified: Secondary | ICD-10-CM | POA: Diagnosis not present

## 2017-05-22 DIAGNOSIS — Z87891 Personal history of nicotine dependence: Secondary | ICD-10-CM | POA: Diagnosis not present

## 2017-05-22 DIAGNOSIS — Z0189 Encounter for other specified special examinations: Secondary | ICD-10-CM

## 2017-05-22 DIAGNOSIS — J90 Pleural effusion, not elsewhere classified: Secondary | ICD-10-CM | POA: Diagnosis not present

## 2017-05-22 DIAGNOSIS — I110 Hypertensive heart disease with heart failure: Secondary | ICD-10-CM | POA: Diagnosis not present

## 2017-05-22 DIAGNOSIS — J9811 Atelectasis: Secondary | ICD-10-CM | POA: Diagnosis not present

## 2017-05-22 DIAGNOSIS — I3139 Other pericardial effusion (noninflammatory): Secondary | ICD-10-CM

## 2017-05-22 DIAGNOSIS — Z9889 Other specified postprocedural states: Secondary | ICD-10-CM

## 2017-05-22 DIAGNOSIS — I2510 Atherosclerotic heart disease of native coronary artery without angina pectoris: Secondary | ICD-10-CM | POA: Diagnosis not present

## 2017-05-22 DIAGNOSIS — I10 Essential (primary) hypertension: Secondary | ICD-10-CM | POA: Diagnosis not present

## 2017-05-22 DIAGNOSIS — D62 Acute posthemorrhagic anemia: Secondary | ICD-10-CM | POA: Diagnosis not present

## 2017-05-22 DIAGNOSIS — I5030 Unspecified diastolic (congestive) heart failure: Secondary | ICD-10-CM | POA: Diagnosis present

## 2017-05-22 DIAGNOSIS — I350 Nonrheumatic aortic (valve) stenosis: Secondary | ICD-10-CM | POA: Diagnosis not present

## 2017-05-22 DIAGNOSIS — Z6833 Body mass index (BMI) 33.0-33.9, adult: Secondary | ICD-10-CM | POA: Diagnosis not present

## 2017-05-22 DIAGNOSIS — I272 Pulmonary hypertension, unspecified: Secondary | ICD-10-CM

## 2017-05-22 DIAGNOSIS — I161 Hypertensive emergency: Secondary | ICD-10-CM | POA: Diagnosis present

## 2017-05-22 DIAGNOSIS — Z952 Presence of prosthetic heart valve: Secondary | ICD-10-CM

## 2017-05-22 DIAGNOSIS — I447 Left bundle-branch block, unspecified: Secondary | ICD-10-CM | POA: Diagnosis not present

## 2017-05-22 DIAGNOSIS — Z79899 Other long term (current) drug therapy: Secondary | ICD-10-CM

## 2017-05-22 DIAGNOSIS — I313 Pericardial effusion (noninflammatory): Secondary | ICD-10-CM

## 2017-05-22 DIAGNOSIS — I719 Aortic aneurysm of unspecified site, without rupture: Secondary | ICD-10-CM | POA: Diagnosis not present

## 2017-05-22 DIAGNOSIS — R0602 Shortness of breath: Secondary | ICD-10-CM

## 2017-05-22 DIAGNOSIS — K219 Gastro-esophageal reflux disease without esophagitis: Secondary | ICD-10-CM | POA: Diagnosis not present

## 2017-05-22 DIAGNOSIS — H04123 Dry eye syndrome of bilateral lacrimal glands: Secondary | ICD-10-CM | POA: Diagnosis not present

## 2017-05-22 DIAGNOSIS — I7103 Dissection of thoracoabdominal aorta: Secondary | ICD-10-CM | POA: Diagnosis not present

## 2017-05-22 DIAGNOSIS — Z419 Encounter for procedure for purposes other than remedying health state, unspecified: Secondary | ICD-10-CM

## 2017-05-22 DIAGNOSIS — Z953 Presence of xenogenic heart valve: Secondary | ICD-10-CM

## 2017-05-22 DIAGNOSIS — I340 Nonrheumatic mitral (valve) insufficiency: Secondary | ICD-10-CM | POA: Diagnosis not present

## 2017-05-22 DIAGNOSIS — E669 Obesity, unspecified: Secondary | ICD-10-CM | POA: Diagnosis present

## 2017-05-22 DIAGNOSIS — I351 Nonrheumatic aortic (valve) insufficiency: Secondary | ICD-10-CM | POA: Diagnosis present

## 2017-05-22 DIAGNOSIS — E1122 Type 2 diabetes mellitus with diabetic chronic kidney disease: Secondary | ICD-10-CM | POA: Diagnosis not present

## 2017-05-22 DIAGNOSIS — I422 Other hypertrophic cardiomyopathy: Secondary | ICD-10-CM | POA: Diagnosis not present

## 2017-05-22 DIAGNOSIS — Z4682 Encounter for fitting and adjustment of non-vascular catheter: Secondary | ICD-10-CM | POA: Diagnosis not present

## 2017-05-22 DIAGNOSIS — Z452 Encounter for adjustment and management of vascular access device: Secondary | ICD-10-CM | POA: Diagnosis not present

## 2017-05-22 DIAGNOSIS — J9601 Acute respiratory failure with hypoxia: Secondary | ICD-10-CM | POA: Diagnosis present

## 2017-05-22 DIAGNOSIS — N2581 Secondary hyperparathyroidism of renal origin: Secondary | ICD-10-CM | POA: Diagnosis present

## 2017-05-22 DIAGNOSIS — R0902 Hypoxemia: Secondary | ICD-10-CM | POA: Diagnosis not present

## 2017-05-22 DIAGNOSIS — R05 Cough: Secondary | ICD-10-CM | POA: Diagnosis not present

## 2017-05-22 DIAGNOSIS — I160 Hypertensive urgency: Secondary | ICD-10-CM | POA: Diagnosis present

## 2017-05-22 DIAGNOSIS — E875 Hyperkalemia: Secondary | ICD-10-CM | POA: Diagnosis not present

## 2017-05-22 DIAGNOSIS — R918 Other nonspecific abnormal finding of lung field: Secondary | ICD-10-CM | POA: Diagnosis not present

## 2017-05-22 DIAGNOSIS — I358 Other nonrheumatic aortic valve disorders: Secondary | ICD-10-CM | POA: Diagnosis not present

## 2017-05-22 DIAGNOSIS — J449 Chronic obstructive pulmonary disease, unspecified: Secondary | ICD-10-CM | POA: Diagnosis present

## 2017-05-22 DIAGNOSIS — I5032 Chronic diastolic (congestive) heart failure: Secondary | ICD-10-CM | POA: Diagnosis not present

## 2017-05-22 DIAGNOSIS — J9600 Acute respiratory failure, unspecified whether with hypoxia or hypercapnia: Secondary | ICD-10-CM | POA: Diagnosis not present

## 2017-05-22 DIAGNOSIS — Z9689 Presence of other specified functional implants: Secondary | ICD-10-CM

## 2017-05-22 DIAGNOSIS — N184 Chronic kidney disease, stage 4 (severe): Secondary | ICD-10-CM | POA: Diagnosis not present

## 2017-05-22 DIAGNOSIS — I083 Combined rheumatic disorders of mitral, aortic and tricuspid valves: Secondary | ICD-10-CM | POA: Diagnosis not present

## 2017-05-22 DIAGNOSIS — Z01810 Encounter for preprocedural cardiovascular examination: Secondary | ICD-10-CM | POA: Diagnosis not present

## 2017-05-22 DIAGNOSIS — Z954 Presence of other heart-valve replacement: Secondary | ICD-10-CM | POA: Diagnosis not present

## 2017-05-22 DIAGNOSIS — G8929 Other chronic pain: Secondary | ICD-10-CM | POA: Diagnosis present

## 2017-05-22 DIAGNOSIS — I71019 Dissection of thoracic aorta, unspecified: Secondary | ICD-10-CM

## 2017-05-22 DIAGNOSIS — I361 Nonrheumatic tricuspid (valve) insufficiency: Secondary | ICD-10-CM | POA: Diagnosis not present

## 2017-05-22 DIAGNOSIS — D696 Thrombocytopenia, unspecified: Secondary | ICD-10-CM | POA: Diagnosis not present

## 2017-05-22 DIAGNOSIS — I130 Hypertensive heart and chronic kidney disease with heart failure and stage 1 through stage 4 chronic kidney disease, or unspecified chronic kidney disease: Secondary | ICD-10-CM | POA: Diagnosis not present

## 2017-05-22 LAB — I-STAT TROPONIN, ED: Troponin i, poc: 0.12 ng/mL (ref 0.00–0.08)

## 2017-05-22 LAB — BASIC METABOLIC PANEL
Anion gap: 12 (ref 5–15)
BUN: 32 mg/dL — ABNORMAL HIGH (ref 6–20)
CHLORIDE: 103 mmol/L (ref 101–111)
CO2: 28 mmol/L (ref 22–32)
CREATININE: 2.62 mg/dL — AB (ref 0.44–1.00)
Calcium: 9.7 mg/dL (ref 8.9–10.3)
GFR calc non Af Amer: 17 mL/min — ABNORMAL LOW (ref >60)
GFR, EST AFRICAN AMERICAN: 20 mL/min — AB (ref >60)
Glucose, Bld: 118 mg/dL — ABNORMAL HIGH (ref 65–99)
POTASSIUM: 4 mmol/L (ref 3.5–5.1)
Sodium: 143 mmol/L (ref 135–145)

## 2017-05-22 LAB — CBC
HEMATOCRIT: 32.9 % — AB (ref 36.0–46.0)
Hemoglobin: 10.4 g/dL — ABNORMAL LOW (ref 12.0–15.0)
MCH: 30.6 pg (ref 26.0–34.0)
MCHC: 31.6 g/dL (ref 30.0–36.0)
MCV: 96.8 fL (ref 78.0–100.0)
PLATELETS: 200 10*3/uL (ref 150–400)
RBC: 3.4 MIL/uL — AB (ref 3.87–5.11)
RDW: 17.1 % — ABNORMAL HIGH (ref 11.5–15.5)
WBC: 7.4 10*3/uL (ref 4.0–10.5)

## 2017-05-22 LAB — TROPONIN I
TROPONIN I: 0.03 ng/mL — AB (ref <0.03)
TROPONIN I: 0.28 ng/mL — AB (ref <0.03)

## 2017-05-22 LAB — BRAIN NATRIURETIC PEPTIDE: B Natriuretic Peptide: 800 pg/mL — ABNORMAL HIGH (ref 0.0–100.0)

## 2017-05-22 MED ORDER — HYDROCORTISONE 2.5 % RE CREA
1.0000 "application " | TOPICAL_CREAM | Freq: Every day | RECTAL | Status: DC | PRN
  Filled 2017-05-22: qty 28.35

## 2017-05-22 MED ORDER — SODIUM CHLORIDE 0.9% FLUSH
3.0000 mL | Freq: Two times a day (BID) | INTRAVENOUS | Status: DC
  Administered 2017-05-24 – 2017-05-29 (×9): 3 mL via INTRAVENOUS

## 2017-05-22 MED ORDER — CALCITRIOL 0.25 MCG PO CAPS
0.5000 ug | ORAL_CAPSULE | Freq: Every day | ORAL | Status: DC
  Administered 2017-05-22 – 2017-05-29 (×7): 0.5 ug via ORAL
  Filled 2017-05-22 (×4): qty 2
  Filled 2017-05-22: qty 1
  Filled 2017-05-22 (×2): qty 2

## 2017-05-22 MED ORDER — SPIRONOLACTONE 25 MG PO TABS
50.0000 mg | ORAL_TABLET | Freq: Every day | ORAL | Status: DC
  Administered 2017-05-23 – 2017-05-26 (×4): 50 mg via ORAL
  Filled 2017-05-22 (×4): qty 2

## 2017-05-22 MED ORDER — FAMOTIDINE 20 MG PO TABS: 10.0000 mg | ORAL_TABLET | Freq: Two times a day (BID) | ORAL | Status: DC | PRN

## 2017-05-22 MED ORDER — LOSARTAN POTASSIUM 50 MG PO TABS: 100.0000 mg | ORAL_TABLET | Freq: Every day | ORAL | Status: DC

## 2017-05-22 MED ORDER — NITROGLYCERIN IN D5W 200-5 MCG/ML-% IV SOLN
0.0000 ug/min | INTRAVENOUS | Status: DC
  Administered 2017-05-22: 5 ug/min via INTRAVENOUS
  Filled 2017-05-22: qty 250

## 2017-05-22 MED ORDER — SODIUM CHLORIDE 0.9% FLUSH: 3.0000 mL | INTRAVENOUS | Status: DC | PRN

## 2017-05-22 MED ORDER — FUROSEMIDE 10 MG/ML IJ SOLN
40.0000 mg | Freq: Once | INTRAMUSCULAR | Status: AC
  Administered 2017-05-22: 40 mg via INTRAVENOUS
  Filled 2017-05-22: qty 4

## 2017-05-22 MED ORDER — ASPIRIN EC 81 MG PO TBEC
81.0000 mg | DELAYED_RELEASE_TABLET | Freq: Every day | ORAL | Status: DC
  Administered 2017-05-22 – 2017-05-27 (×6): 81 mg via ORAL
  Filled 2017-05-22 (×6): qty 1

## 2017-05-22 MED ORDER — FLUTICASONE PROPIONATE 50 MCG/ACT NA SUSP
2.0000 | Freq: Every day | NASAL | Status: DC
  Administered 2017-05-22 – 2017-05-29 (×6): 2 via NASAL
  Filled 2017-05-22: qty 16

## 2017-05-22 MED ORDER — HYDRALAZINE HCL 20 MG/ML IJ SOLN
10.0000 mg | Freq: Three times a day (TID) | INTRAMUSCULAR | Status: DC | PRN
  Administered 2017-05-22 – 2017-05-28 (×2): 10 mg via INTRAVENOUS
  Filled 2017-05-22 (×3): qty 1

## 2017-05-22 MED ORDER — LABETALOL HCL 200 MG PO TABS: 400.0000 mg | ORAL_TABLET | Freq: Two times a day (BID) | ORAL | Status: DC

## 2017-05-22 MED ORDER — LOSARTAN POTASSIUM 50 MG PO TABS
100.0000 mg | ORAL_TABLET | Freq: Every day | ORAL | Status: DC
  Administered 2017-05-23 – 2017-05-25 (×3): 100 mg via ORAL
  Filled 2017-05-22 (×3): qty 2

## 2017-05-22 MED ORDER — POLYVINYL ALCOHOL 1.4 % OP SOLN
1.0000 [drp] | Freq: Every day | OPHTHALMIC | Status: DC | PRN
  Filled 2017-05-22: qty 15

## 2017-05-22 MED ORDER — HYDRALAZINE HCL 25 MG PO TABS
25.0000 mg | ORAL_TABLET | Freq: Two times a day (BID) | ORAL | Status: DC
  Administered 2017-05-22 – 2017-05-25 (×6): 25 mg via ORAL
  Filled 2017-05-22 (×6): qty 1

## 2017-05-22 MED ORDER — ONDANSETRON HCL 4 MG/2ML IJ SOLN
4.0000 mg | Freq: Four times a day (QID) | INTRAMUSCULAR | Status: DC | PRN
  Administered 2017-05-26 – 2017-05-27 (×2): 4 mg via INTRAVENOUS
  Filled 2017-05-22 (×2): qty 2

## 2017-05-22 MED ORDER — ENOXAPARIN SODIUM 40 MG/0.4ML ~~LOC~~ SOLN
40.0000 mg | SUBCUTANEOUS | Status: DC
  Administered 2017-05-22: 40 mg via SUBCUTANEOUS
  Filled 2017-05-22 (×2): qty 0.4

## 2017-05-22 MED ORDER — FUROSEMIDE 10 MG/ML IJ SOLN
60.0000 mg | Freq: Two times a day (BID) | INTRAMUSCULAR | Status: DC
  Administered 2017-05-22 – 2017-05-23 (×2): 60 mg via INTRAVENOUS
  Filled 2017-05-22 (×2): qty 6

## 2017-05-22 MED ORDER — GABAPENTIN 300 MG PO CAPS
300.0000 mg | ORAL_CAPSULE | Freq: Three times a day (TID) | ORAL | Status: DC
  Filled 2017-05-22 (×5): qty 1

## 2017-05-22 MED ORDER — SODIUM CHLORIDE 0.9 % IV SOLN: 250.0000 mL | INTRAVENOUS | Status: DC | PRN

## 2017-05-22 MED ORDER — ACETAMINOPHEN 325 MG PO TABS: 650.0000 mg | ORAL_TABLET | ORAL | Status: DC | PRN

## 2017-05-22 MED ORDER — HYDRALAZINE HCL 25 MG PO TABS: 25.0000 mg | ORAL_TABLET | Freq: Two times a day (BID) | ORAL | Status: DC

## 2017-05-22 MED ORDER — ALBUTEROL SULFATE (2.5 MG/3ML) 0.083% IN NEBU
2.5000 mg | INHALATION_SOLUTION | RESPIRATORY_TRACT | Status: DC | PRN
  Administered 2017-05-29: 2.5 mg via RESPIRATORY_TRACT

## 2017-05-22 MED ORDER — LABETALOL HCL 200 MG PO TABS
400.0000 mg | ORAL_TABLET | Freq: Two times a day (BID) | ORAL | Status: DC
  Administered 2017-05-22 – 2017-05-25 (×6): 400 mg via ORAL
  Filled 2017-05-22 (×6): qty 2

## 2017-05-22 MED ORDER — SPIRONOLACTONE 50 MG PO TABS: 50.0000 mg | ORAL_TABLET | Freq: Every day | ORAL | Status: DC

## 2017-05-22 MED ORDER — ENOXAPARIN SODIUM 30 MG/0.3ML ~~LOC~~ SOLN
30.0000 mg | SUBCUTANEOUS | Status: DC
  Administered 2017-05-23 – 2017-05-27 (×5): 30 mg via SUBCUTANEOUS
  Filled 2017-05-22 (×5): qty 0.3

## 2017-05-22 NOTE — Telephone Encounter (Signed)
Called and spoke with Erline Levine from [facility] who stated they were unable to do the CT on pt due to pt unable to lay down.  Erline Levine stated they tried multiple times to get pt to lay down but she was unable to do so due to breathing getting the best of her and probably combination with anxiety.  Will route this to MR as an [place]

## 2017-05-22 NOTE — Progress Notes (Unsigned)
I only performed 3 Echo images on patient. She was very SOB. Dr. Radford Pax consulted with her and sent her to the ER

## 2017-05-22 NOTE — Progress Notes (Signed)
Patient arrived on a stretcher from the ER, Vital signs obtained see flowsheet, placed on tele ccmd notified,patient oriented to room and staff, bed in lowest position, call bell in reach will continue to monitor.

## 2017-05-22 NOTE — ED Triage Notes (Signed)
Patient arrived from heart care for low sats and increased SOB. Was to have Echo and CT today and sats in 80s. On arrival patient placed on oxygen at 2l and sats 98. States that her SOB resolved. No CP, dry cough on arrival. States unable to walk w/o SOB

## 2017-05-22 NOTE — ED Notes (Signed)
Pt placed on 2L/min of O2, per Roselyn Reef, Therapist, sports..  Pt's SpO2 increased to 97%

## 2017-05-22 NOTE — ED Provider Notes (Signed)
[facility] EMERGENCY DEPARTMENT Provider Note   CSN: [id number] Arrival date & time: 05/22/17  1052     History   Chief Complaint No chief complaint on file.   HPI [name] is a 72 y.o. female.  Patient with history of diastolic heart failure on torsemide, aortic insufficiency, known chronic pericardial effusion being monitored by cardiology, Dr. Percival Spanish his primary cardiologist, chronic kidney disease --presents the emergency department with worsening shortness of breath.  She was found to be hypoxic into the 80s today.  Patient likely with multifactorial dyspnea currently being worked up by cardiology and pulmonology.  Patient went for a CT of her chest today but was too short of breath to lie flat so she was sent to the emergency department.  Patient reports no chest pains, cough, or fever.  She has lower extremity edema that is about at baseline.  She has had trouble sleeping due to shortness of breath.  States that she sleeps lying on her side propped up on 2 pillows.  No nausea, vomiting, or diarrhea. The onset of this condition was acute on chronic. The course is gradually worsening.         Past Medical History:  Diagnosis Date  . Acute pancreatitis 02/04/2017   [name] 02/04/2017  . Adenomatous colon polyp 01/2002  . Anemia, chronic disease    /notes 02/04/2017  . Aortic valve disease    AI/AS  . Arthritis    "lower back, knees" (02/04/2017)  . CHF (congestive heart failure) ([place])   . CKD (chronic kidney disease), stage III ([place])   . Diverticulosis of colon   . DJD (degenerative joint disease)   . Frequent UTI   . Hemorrhoids   . Hypertension   . Lumbar back pain   . Neuropathy   . Obesity   . Pericardial effusion     in a patient with Diastolic heart  failure and Pericardial effusion  known since last 2 D echo in 11/2016 /notes 02/04/2017  . PONV (postoperative nausea and vomiting)   . Pulmonary HTN ([place])   . Renal cyst   . Venous  insufficiency   . Vitamin D deficiency     Patient Active Problem List   Diagnosis Date Noted  . Acute on chronic diastolic CHF (congestive heart failure) ([place]) 04/19/2017  . Nonrheumatic aortic valve insufficiency 03/10/2017  . Pulmonary HTN ([place]) 03/10/2017  . Chronic midline posterior neck pain 02/11/2017  . AKI (acute kidney injury) ([place])   . Lobar pneumonia ([place])   . Acute respiratory failure with hypoxia ([place])   . Pericardial effusion 02/04/2017  . Abnormal bone marrow examination 01/24/2017  . Stenosis of right carotid artery 08/27/2016  . LBBB (left bundle branch block) 08/27/2016  . Left-sided tinnitus 08/16/2016  . Anemia in chronic kidney disease 11/02/2015  . CKD (chronic kidney disease) stage 4, GFR 15-29 ml/min (HCC) 04/26/2014  . Diastolic congestive heart failure ([place]) 04/26/2014  . Knee pain, bilateral 03/23/2012  . Murmur 02/24/2012  . Vitamin D deficiency 04/13/2008  . OBESITY 02/23/2008  . Radiculopathy 02/23/2008  . Essential hypertension 02/23/2008  . Venous (peripheral) insufficiency 02/23/2008    Past Surgical History:  Procedure Laterality Date  . ABDOMINAL HYSTERECTOMY     "partial"  . COLONOSCOPY W/ BIOPSIES AND POLYPECTOMY     "bx was ok"    OB History    No data available       Home Medications    Prior to Admission medications  Medication Sig Start Date End Date Taking? Authorizing Provider  amLODipine (NORVASC) 10 MG tablet Take 1 tablet (10 mg total) by mouth daily. 05/01/14   Velvet Bathe, MD  calcitRIOL (ROCALTROL) 0.25 MCG capsule Take 0.5 mcg by mouth daily. 06/14/15   [provider]  calcium carbonate (TUMS - DOSED IN MG ELEMENTAL CALCIUM) 500 MG chewable tablet Chew 2 tablets by mouth daily as needed for indigestion or heartburn.    [provider]  fluticasone (FLONASE) 50 MCG/ACT nasal spray Place 2 sprays daily into both nostrils. 02/12/17   Debbe Odea, MD  gabapentin (NEURONTIN) 300 MG capsule TAKE 1  CAPSULE(300 MG) BY MOUTH THREE TIMES DAILY AS NEEDED FOR PAIN 01/14/17   Hoyt Koch, MD  hydrALAZINE (APRESOLINE) 50 MG tablet Take 50 mg by mouth as directed. 1/2 tablet by mouth twice a day    [provider]  hydrocortisone (ANUSOL-HC) 2.5 % rectal cream Place 1 application rectally as needed. 05/24/13   Noralee Space, MD  labetalol (NORMODYNE) 200 MG tablet TAKE 2 TABLETS BY MOUTH TWICE DAILY Patient taking differently: take two tablets 400mg  total  twice  daily    Noralee Space, MD  losartan (COZAAR) 100 MG tablet Take 1 tablet (100 mg total) by mouth daily. 04/20/17   Truett Mainland, DO  ondansetron (ZOFRAN) 4 MG tablet Take 1 tablet (4 mg total) by mouth every 8 (eight) hours as needed for nausea or vomiting. 01/04/16   Hoyt Koch, MD  pantoprazole (PROTONIX) 40 MG tablet Take 1 tablet (40 mg total) daily by mouth. 02/12/17   Debbe Odea, MD  PROCRIT 53976 UNIT/ML injection Inject 40,000 Units into the skin every 14 (fourteen) days. 06/26/16   [provider]  Propylene Glycol (SYSTANE BALANCE) 0.6 % SOLN Place 1 drop into both eyes daily as needed (for dry eyes).    [provider]  spironolactone (ALDACTONE) 25 MG tablet Take 2 tablets (50 mg total) daily by mouth. 02/11/17   Debbe Odea, MD  torsemide (DEMADEX) 20 MG tablet TAKE 2 TABLETS BY MOUTH EVERY MORNING Patient taking differently: TAKE 2 TABLETS 40mg  total BY MOUTH EVERY MORNING 01/18/13   Noralee Space, MD  traMADol (ULTRAM) 50 MG tablet Take 1 tablet (50 mg total) by mouth 3 (three) times daily as needed for severe pain. 12/16/16   Duffy Bruce, MD  triamcinolone ointment (KENALOG) 0.5 % Apply 1 application topically 2 (two) times daily. 08/15/16   Hoyt Koch, MD    Family History Family History  Problem Relation Age of Onset  . Diabetes Mother   . Colon polyps Mother   . Hypertension Mother   . Hypertension Brother   . Hypertension Sister   . Colon cancer Neg  Hx     Social History Social History   Tobacco Use  . Smoking status: Former Smoker    Packs/day: 0.40    Years: 15.00    Pack years: 6.00    Types: Cigarettes    Last attempt to quit: 04/08/1982    Years since quitting: 35.1  . Smokeless tobacco: Never Used  Substance Use Topics  . Alcohol use: No  . Drug use: No     Allergies   Minoxidil and Naproxen   Review of Systems Review of Systems  Constitutional: Negative for diaphoresis and fever.  Eyes: Negative for redness.  Respiratory: Positive for cough. Negative for shortness of breath.   Cardiovascular: Positive for leg swelling. Negative for chest pain  and palpitations.  Gastrointestinal: Negative for abdominal pain, nausea and vomiting.  Genitourinary: Negative for dysuria.  Musculoskeletal: Negative for back pain and neck pain.  Skin: Negative for rash.  Neurological: Negative for syncope and light-headedness.  Psychiatric/Behavioral: The patient is not nervous/anxious.      Physical Exam Updated Vital Signs BP (!) 190/62 (BP Location: Left Arm)   Pulse 93   Temp 98.7 F (37.1 C) (Oral)   Resp 18   Ht 5\' 6"  (1.676 m)   Wt 88.9 kg (196 lb)   SpO2 100%   BMI 31.64 kg/m   Physical Exam  Constitutional: She appears well-developed and well-nourished.  HENT:  Head: Normocephalic and atraumatic.  Mouth/Throat: Oropharynx is clear and moist.  Eyes: Conjunctivae are normal. Right eye exhibits no discharge. Left eye exhibits no discharge.  Neck: Normal range of motion. Neck supple.  Cardiovascular: Normal rate and regular rhythm.  Murmur heard. Pulmonary/Chest: Effort normal. No stridor. No respiratory distress. She has no wheezes. She has rales in the right lower field and the left lower field.  Abdominal: Soft. There is no tenderness. There is no rebound and no guarding.  Musculoskeletal: She exhibits edema.  1-2+ pitting edema bilaterally and symmetric to mid ankles  Neurological: She is alert.  Skin:  Skin is warm and dry.  Psychiatric: She has a normal mood and affect.  Nursing note and vitals reviewed.    ED Treatments / Results  Labs (all labs ordered are listed, but only abnormal results are displayed) Labs Reviewed  BASIC METABOLIC PANEL - Abnormal; Notable for the following components:      Result Value   Glucose, Bld 118 (*)    BUN 32 (*)    Creatinine, Ser 2.62 (*)    GFR calc non Af Amer 17 (*)    GFR calc Af Amer 20 (*)    All other components within normal limits  CBC - Abnormal; Notable for the following components:   RBC 3.40 (*)    Hemoglobin 10.4 (*)    HCT 32.9 (*)    RDW 17.1 (*)    All other components within normal limits  BRAIN NATRIURETIC PEPTIDE - Abnormal; Notable for the following components:   B Natriuretic Peptide 800.0 (*)    All other components within normal limits  I-STAT TROPONIN, ED - Abnormal; Notable for the following components:   Troponin i, poc 0.12 (*)    All other components within normal limits  TROPONIN I    EKG  EKG Interpretation  Date/Time:  Thursday May 22 2017 10:56:46 EST Ventricular Rate:  104 PR Interval:  164 QRS Duration: 162 QT Interval:  414 QTC Calculation: 544 R Axis:   -64 Text Interpretation:  Sinus tachycardia Left axis deviation Left bundle branch block Abnormal ECG Non-specific changes since last EKG, though limited from baseline wander Confirmed by Duffy Bruce [phone]) on 05/22/2017 11:02:02 AM Also confirmed by Duffy Bruce [phone]), editor Philomena Doheny [phone])  on 05/22/2017 11:14:01 AM       Radiology Dg Chest 2 View  Result Date: 05/22/2017 CLINICAL DATA:  Shortness of breath. EXAM: CHEST  2 VIEW COMPARISON:  Radiographs of April 19, 2017. FINDINGS: Stable severe cardiomegaly is noted. Stable central pulmonary vascular congestion is noted. Interstitial densities are noted in both lung bases concerning for pulmonary edema. No pneumothorax or significant pleural effusion is noted. Bony  thorax is unremarkable. IMPRESSION: Stable cardiomegaly with central pulmonary vascular congestion. Probable bilateral pulmonary edema is noted. Electronically Signed  By: Marijo Conception, M.D.   On: 05/22/2017 11:32    Procedures Procedures (including critical care time)  Medications Ordered in ED Medications  hydrALAZINE (APRESOLINE) injection 10 mg (not administered)  nitroGLYCERIN 50 mg in dextrose 5 % 250 mL (0.2 mg/mL) infusion (not administered)  furosemide (LASIX) injection 40 mg (40 mg Intravenous Given 05/22/17 1253)     Initial Impression / Assessment and Plan / ED Course  I have reviewed the triage vital signs and the nursing notes.  Pertinent labs & imaging results that were available during my care of the patient were reviewed by me and considered in my medical decision making (see chart for details).     Patient seen and examined. Work-up initiated. Medications ordered.   Vital signs reviewed and are as follows: BP (!) 190/62 (BP Location: Left Arm)   Pulse 93   Temp 98.7 F (37.1 C) (Oral)   Resp 18   Ht 5\' 6"  (1.676 m)   Wt 88.9 kg (196 lb)   SpO2 100%   BMI 31.64 kg/m   Reviewed previous hospital records.  IV Lasix ordered for the patient.  We discussed need for admission given worsening shortness of breath, signs of fluid overload, and need for repeat echocardiogram.  Patient is in agreement.  Troponin is noted to be 0.12 today which is in line with previous troponin in January.  Do not suspect acute coronary syndrome without pain, EKG changes, and likely other cause of elevated troponin.   Patient discussed with Dr. Ellender Hose.   Spoke with Dr. Aggie Moats who will admit patient.   Final Clinical Impressions(s) / ED Diagnoses   Final diagnoses:  Shortness of breath  Hypoxia  Acute on chronic diastolic congestive heart failure ([place])   Admit.   ED Discharge Orders    None       Carlisle Cater, PA-C 05/22/17 1418    Duffy Bruce, MD 05/23/17  [phone]

## 2017-05-22 NOTE — Telephone Encounter (Signed)
She is in the ER so that is good. She can keep her fu with Eric [facility] APP in march 2019. Nothing else needed  Dr. Brand Males, M.D., [facility].C.P Pulmonary and Critical Care Medicine Staff Physician, [place] Director - Interstitial Lung Disease  Program  Pulmonary [place] [facility] [place], [place], [address]  Pager: [phone], If no answer or between  15:00h - 7:00h: call [phone] Telephone: [phone]

## 2017-05-22 NOTE — H&P (Signed)
[facility] History and Physical  [name] OMV:[id number] DOB: 07-24-1945 DOA: 05/22/2017  Referring physician:  PCP: Hoyt Koch, MD   Chief Complaint:   HPI: [name] is a 72 y.o. female  W/ pmhx CKD, CHF, HTN, anemia, history of pericardial effusion who presents to the emergency room with shortness of breath.  Patient states roughly 2 weeks ago she went to her doctor's office and the dose of 1 of her blood pressure medications was cut in half due to her blood pressure being at goal.  No other recent medication changes.  Patient has been eating and drinking well.  Progressively over the last week patient has become increasingly short of breath.  Developed a dry hacking cough.  Patient got short of breath to the point that she could not lay flat.  Patient went to see her doctor today and was to get a CT of the chest.  Patient unable to lay flat.  Patient also unable to finish scheduled a cardiac echo.  Patient became acutely hypoxic during CT.  Patient sent over to the emergency room for evaluation.  ED course: Chest x-ray shows pulmonary edema.  Patient placed on nasal cannula.  Able to maintain oxygen saturation.  Troponin with mild elevation which appears to be chronic.  Patient given a dose of Lasix.  Hospitalist consulted for admission.   Review of Systems:  As per HPI otherwise 10 point review of systems negative.    Past Medical History:  Diagnosis Date  . Acute pancreatitis 02/04/2017   [name] 02/04/2017  . Adenomatous colon polyp 01/2002  . Anemia, chronic disease    /notes 02/04/2017  . Aortic valve disease    AI/AS  . Arthritis    "lower back, knees" (02/04/2017)  . CHF (congestive heart failure) ([place])   . CKD (chronic kidney disease), stage III ([place])   . Diverticulosis of colon   . DJD (degenerative joint disease)   . Frequent UTI   . Hemorrhoids   . Hypertension   . Lumbar back pain   . Neuropathy   . Obesity   . Pericardial effusion    in a patient with Diastolic heart  failure and Pericardial effusion  known since last 2 D echo in 11/2016 /notes 02/04/2017  . PONV (postoperative nausea and vomiting)   . Pulmonary HTN ([place])   . Renal cyst   . Venous insufficiency   . Vitamin D deficiency    Past Surgical History:  Procedure Laterality Date  . ABDOMINAL HYSTERECTOMY     "partial"  . COLONOSCOPY W/ BIOPSIES AND POLYPECTOMY     "bx was ok"   Social History:  reports that she quit smoking about 35 years ago. Her smoking use included cigarettes. She has a 6.00 pack-year smoking history. she has never used smokeless tobacco. She reports that she does not drink alcohol or use drugs.  Allergies  Allergen Reactions  . Minoxidil Palpitations and Other (See Comments)    unable to sleep  . Naproxen Swelling    Family History  Problem Relation Age of Onset  . Diabetes Mother   . Colon polyps Mother   . Hypertension Mother   . Hypertension Brother   . Hypertension Sister   . Colon cancer Neg Hx      Prior to Admission medications   Medication Sig Start Date End Date Taking? Authorizing Provider  amLODipine (NORVASC) 10 MG tablet Take 1 tablet (10 mg total) by mouth daily. 05/01/14   Velvet Bathe, MD  calcitRIOL (ROCALTROL) 0.25 MCG capsule Take 0.5 mcg by mouth daily. 06/14/15   [provider]  calcium carbonate (TUMS - DOSED IN MG ELEMENTAL CALCIUM) 500 MG chewable tablet Chew 2 tablets by mouth daily as needed for indigestion or heartburn.    [provider]  fluticasone (FLONASE) 50 MCG/ACT nasal spray Place 2 sprays daily into both nostrils. 02/12/17   Debbe Odea, MD  gabapentin (NEURONTIN) 300 MG capsule TAKE 1 CAPSULE(300 MG) BY MOUTH THREE TIMES DAILY AS NEEDED FOR PAIN 01/14/17   Hoyt Koch, MD  hydrALAZINE (APRESOLINE) 50 MG tablet Take 50 mg by mouth as directed. 1/2 tablet by mouth twice a day    [provider]  hydrocortisone (ANUSOL-HC) 2.5 % rectal cream Place 1  application rectally as needed. 05/24/13   Noralee Space, MD  labetalol (NORMODYNE) 200 MG tablet TAKE 2 TABLETS BY MOUTH TWICE DAILY Patient taking differently: take two tablets 400mg  total  twice  daily    Noralee Space, MD  losartan (COZAAR) 100 MG tablet Take 1 tablet (100 mg total) by mouth daily. 04/20/17   Truett Mainland, DO  ondansetron (ZOFRAN) 4 MG tablet Take 1 tablet (4 mg total) by mouth every 8 (eight) hours as needed for nausea or vomiting. 01/04/16   Hoyt Koch, MD  pantoprazole (PROTONIX) 40 MG tablet Take 1 tablet (40 mg total) daily by mouth. 02/12/17   Debbe Odea, MD  PROCRIT 11914 UNIT/ML injection Inject 40,000 Units into the skin every 14 (fourteen) days. 06/26/16   [provider]  Propylene Glycol (SYSTANE BALANCE) 0.6 % SOLN Place 1 drop into both eyes daily as needed (for dry eyes).    [provider]  spironolactone (ALDACTONE) 25 MG tablet Take 2 tablets (50 mg total) daily by mouth. 02/11/17   Debbe Odea, MD  torsemide (DEMADEX) 20 MG tablet TAKE 2 TABLETS BY MOUTH EVERY MORNING Patient taking differently: TAKE 2 TABLETS 40mg  total BY MOUTH EVERY MORNING 01/18/13   Noralee Space, MD  traMADol (ULTRAM) 50 MG tablet Take 1 tablet (50 mg total) by mouth 3 (three) times daily as needed for severe pain. 12/16/16   Duffy Bruce, MD  triamcinolone ointment (KENALOG) 0.5 % Apply 1 application topically 2 (two) times daily. 08/15/16   Hoyt Koch, MD   Physical Exam: Vitals:   05/22/17 1056 05/22/17 1057 05/22/17 1207  BP: (!) 212/72  (!) 190/62  Pulse: (!) 107  93  Resp: 16  18  Temp: 98.3 F (36.8 C)  98.7 F (37.1 C)  TempSrc: Oral  Oral  SpO2: (!) 89%  100%  Weight:  88.9 kg (196 lb)   Height:  5\' 6"  (1.676 m)     Wt Readings from Last 3 Encounters:  05/22/17 88.9 kg (196 lb)  05/14/17 89.4 kg (197 lb)  04/29/17 88.9 kg (196 lb)    General:  Appears calm and comfortable; A&Ox3 Eyes:  PERRL, EOMI, normal lids,  iris ENT:  grossly normal hearing, lips & tongue Neck:  no LAD, masses or thyromegaly Cardiovascular:  Systolic murmur, RRR  Respiratory:  decr air movement, basilar rales, speaking in short sentences Abdomen:  soft, ntnd Skin:  no rash or induration seen on limited exam Musculoskeletal:  grossly normal tone BUE/BLE Psychiatric:  grossly normal mood and affect, speech fluent and appropriate Neurologic:  CN 2-12 grossly intact, moves all extremities in coordinated fashion.          Labs on Admission:  Basic Metabolic Panel: Recent Labs  Lab 05/22/17 1102  NA 143  K 4.0  CL 103  CO2 28  GLUCOSE 118*  BUN 32*  CREATININE 2.62*  CALCIUM 9.7   Liver Function Tests: No results for input(s): AST, ALT, ALKPHOS, BILITOT, PROT, ALBUMIN in the last 168 hours. No results for input(s): LIPASE, AMYLASE in the last 168 hours. No results for input(s): AMMONIA in the last 168 hours. CBC: Recent Labs  Lab 05/22/17 1102  WBC 7.4  HGB 10.4*  HCT 32.9*  MCV 96.8  PLT 200   Cardiac Enzymes: No results for input(s): CKTOTAL, CKMB, CKMBINDEX, TROPONINI in the last 168 hours.  BNP (last 3 results) Recent Labs    04/19/17 1653 05/22/17 1102  BNP 766.4* 800.0*    ProBNP (last 3 results) Recent Labs    11/27/16 1036  PROBNP 202.0*     Serum creatinine: 2.62 mg/dL (H) 05/22/17 1102 Estimated creatinine clearance: 22.1 mL/min (A)  CBG: No results for input(s): GLUCAP in the last 168 hours.  Radiological Exams on Admission: Dg Chest 2 View  Result Date: 05/22/2017 CLINICAL DATA:  Shortness of breath. EXAM: CHEST  2 VIEW COMPARISON:  Radiographs of April 19, 2017. FINDINGS: Stable severe cardiomegaly is noted. Stable central pulmonary vascular congestion is noted. Interstitial densities are noted in both lung bases concerning for pulmonary edema. No pneumothorax or significant pleural effusion is noted. Bony thorax is unremarkable. IMPRESSION: Stable cardiomegaly with central  pulmonary vascular congestion. Probable bilateral pulmonary edema is noted. Electronically Signed   By: Marijo Conception, M.D.   On: 05/22/2017 11:32    EKG: Independently reviewed. Tachy, LBBB, no stemi  Assessment/Plan Active Problems:   Hypertensive emergency  Acute heart failure & HTN Emergency Serial troponin, hx of mild troponemia Diuresis bid with lasix, holding home demadex Fluid seen on CXR Echo tomorrow AM Ins and outs Continuous pulse ox When necessary hydralazine 10 mg IV as needed for severe blood pressure Nitro drip ordered BP goal: 160/100 Prn albuterol  HTN Hold norvasc, would discontinue Cont cozaar, aldactone, labtalol  GERD Cont H2 blocker  Dry eyes Cont eye drops  CKD Monitor Cr daily Cr at baseline,  2.0-2.6 Cont calcitriol  Allergies Cont flonase  Chronic pain Cont gabapentin Hold tramadol  Code Status: FC  DVT Prophylaxis: lovenox Family Communication: husban dat bedside Disposition Plan: Pending Improvement  Status: sdu, inpt  Elwin Mocha, MD Family Medicine [facility] [email or web address] Password [email or web address]

## 2017-05-22 NOTE — ED Notes (Signed)
Lab results taken to [place]

## 2017-05-23 ENCOUNTER — Ambulatory Visit: Payer: Medicare Other | Admitting: Physician Assistant

## 2017-05-23 ENCOUNTER — Other Ambulatory Visit (HOSPITAL_COMMUNITY): Payer: Medicare Other

## 2017-05-23 ENCOUNTER — Inpatient Hospital Stay (HOSPITAL_COMMUNITY): Payer: Medicare Other

## 2017-05-23 DIAGNOSIS — I5033 Acute on chronic diastolic (congestive) heart failure: Secondary | ICD-10-CM

## 2017-05-23 DIAGNOSIS — I313 Pericardial effusion (noninflammatory): Secondary | ICD-10-CM

## 2017-05-23 DIAGNOSIS — J9600 Acute respiratory failure, unspecified whether with hypoxia or hypercapnia: Secondary | ICD-10-CM

## 2017-05-23 DIAGNOSIS — I272 Pulmonary hypertension, unspecified: Secondary | ICD-10-CM

## 2017-05-23 DIAGNOSIS — N184 Chronic kidney disease, stage 4 (severe): Secondary | ICD-10-CM

## 2017-05-23 DIAGNOSIS — I110 Hypertensive heart disease with heart failure: Secondary | ICD-10-CM

## 2017-05-23 DIAGNOSIS — I351 Nonrheumatic aortic (valve) insufficiency: Secondary | ICD-10-CM

## 2017-05-23 DIAGNOSIS — I422 Other hypertrophic cardiomyopathy: Secondary | ICD-10-CM

## 2017-05-23 DIAGNOSIS — I340 Nonrheumatic mitral (valve) insufficiency: Secondary | ICD-10-CM

## 2017-05-23 DIAGNOSIS — I361 Nonrheumatic tricuspid (valve) insufficiency: Secondary | ICD-10-CM

## 2017-05-23 DIAGNOSIS — N179 Acute kidney failure, unspecified: Secondary | ICD-10-CM

## 2017-05-23 DIAGNOSIS — I130 Hypertensive heart and chronic kidney disease with heart failure and stage 1 through stage 4 chronic kidney disease, or unspecified chronic kidney disease: Principal | ICD-10-CM

## 2017-05-23 DIAGNOSIS — I161 Hypertensive emergency: Secondary | ICD-10-CM

## 2017-05-23 LAB — BASIC METABOLIC PANEL
ANION GAP: 13 (ref 5–15)
BUN: 32 mg/dL — ABNORMAL HIGH (ref 6–20)
CHLORIDE: 101 mmol/L (ref 101–111)
CO2: 29 mmol/L (ref 22–32)
Calcium: 9.7 mg/dL (ref 8.9–10.3)
Creatinine, Ser: 2.53 mg/dL — ABNORMAL HIGH (ref 0.44–1.00)
GFR calc non Af Amer: 18 mL/min — ABNORMAL LOW (ref >60)
GFR, EST AFRICAN AMERICAN: 21 mL/min — AB (ref >60)
Glucose, Bld: 99 mg/dL (ref 65–99)
Potassium: 3.8 mmol/L (ref 3.5–5.1)
SODIUM: 143 mmol/L (ref 135–145)

## 2017-05-23 LAB — ECHOCARDIOGRAM COMPLETE
HEIGHTINCHES: 66 in
WEIGHTICAEL: 3120 [oz_av]

## 2017-05-23 LAB — TROPONIN I
Troponin I: 0.25 ng/mL (ref <0.03)
Troponin I: 0.21 ng/mL (ref <0.03)

## 2017-05-23 MED ORDER — FUROSEMIDE 10 MG/ML IJ SOLN
20.0000 mg | Freq: Once | INTRAMUSCULAR | Status: AC
  Administered 2017-05-23: 20 mg via INTRAVENOUS
  Filled 2017-05-23: qty 2

## 2017-05-23 MED ORDER — FUROSEMIDE 10 MG/ML IJ SOLN
80.0000 mg | Freq: Two times a day (BID) | INTRAMUSCULAR | Status: DC
  Administered 2017-05-23 – 2017-05-25 (×4): 80 mg via INTRAVENOUS
  Filled 2017-05-23 (×4): qty 8

## 2017-05-23 NOTE — Consult Note (Signed)
Cardiology Consultation:   Patient ID: [name]; [id number]; Sep 09, 1945   Admit date: 05/22/2017 Date of Consult: 05/23/2017  Primary Care Provider: Hoyt Koch, MD Primary Cardiologist: Dr. Minus Breeding   Patient Profile:   [name] is a 72 y.o. female with a hx of known chronic pericardial effusion since 2018 (however progressively becoming larger), diastolic heart failure (EF 60-65% with moderate aortic insufficiency and mitral regurgitation with grade 1 DD (per echo 04/20/17), LBBB, CAD, chronic bilateral LE edema, HTN, and CKD stage III who is being seen today for the evaluation of large pericardial effusion and severe SOB at the request of Dr. Aggie Moats.   History of Present Illness:   [name] is a pleasant 72 yo F who presented to the ED on 05/22/17 with worsening shortness of breath, as well as significant orthopnea symptoms while getting an outpatient echocardiogram yesterday. She is followed by Dr. Percival Spanish for her known pericardial effusion, moderate aortic insufficiency and mitral regurgitation for which she was scheduled for a follow up echo on 05/22/17. Additionally, she was  to have a chest CT completed during this visit scheduled by pulmonary to r/o malignancy or other etiologies for her sympotms. During the procedure, she was unable to lie flat due to severe orthopnea. She was sent to the ED from the outpatient heart center for further evaluation.  In the emergency department, a chest x-ray was performed which showed pulmonary edema.  She was found to be mildly hypoxic upon arrival with saturations at 88%. Troponin levels were drawn which have been mildly elevated at 0.03, 0.28, 0.25, 0.21. The patient has had no reports of chest or back pain, jaw pain or other anginal symptoms.  It appears that her troponin levels are mildly elevated at baseline.  Her blood pressure was elevated upon arrival to the emergency department with systolic BPs greater than 190.  She  was placed on a nitroglycerin drip which has been successful.  Her creatinine is elevated at 2.53, however she has a history of chronic kidney disease stage IV.  Her BNP was elevated on arrival at 800.  She was admitted to [facility] hospitalist service and cardiology was consulted.  Today, on 05/23/17, she reports that her SOB has been progressively getting worse over the last several weeks, however seemed to further decline over the course of this last week including SOB that has been worse with exertion. She was last seen by Dr. Percival Spanish on 04/24/17 for follow up for similar issues in which it was noted that she exhibied moderate dyspnea at baseline. She was recently sent to pulmonary for sleep study and further evaluation. Dr. Percival Spanish suggests in his note that she would potentially benefit from a right cardiac cath at some point due to her progressive symptoms.   Her outpatient echo was read in the meantime, which showed a large pericardial effusion, however the results may be skewed due to minial imaging and the need to abort intra procedurally. Therefore, a subsequent echocardiogram was ordered stat today to further evaluate. TCTS was consulted by IM in anticipation of a pericardial window however, this may not be the offending etiology.  Past Medical History:  Diagnosis Date  . Acute pancreatitis 02/04/2017   [name] 02/04/2017  . Adenomatous colon polyp 01/2002  . Anemia, chronic disease    /notes 02/04/2017  . Aortic valve disease    AI/AS  . Arthritis    "lower back, knees" (02/04/2017)  . CHF (congestive heart failure) ([place])   .  CKD (chronic kidney disease), stage III ([place])   . Diverticulosis of colon   . DJD (degenerative joint disease)   . Frequent UTI   . Hemorrhoids   . Hypertension   . Lumbar back pain   . Neuropathy   . Obesity   . Pericardial effusion     in a patient with Diastolic heart  failure and Pericardial effusion  known since last 2 D echo in 11/2016 /notes 02/04/2017   . PONV (postoperative nausea and vomiting)   . Pulmonary HTN ([place])   . Renal cyst   . Venous insufficiency   . Vitamin D deficiency     Past Surgical History:  Procedure Laterality Date  . ABDOMINAL HYSTERECTOMY     "partial"  . COLONOSCOPY W/ BIOPSIES AND POLYPECTOMY     "bx was ok"     Prior to Admission medications   Medication Sig Start Date End Date Taking? Authorizing Provider  amLODipine (NORVASC) 10 MG tablet Take 1 tablet (10 mg total) by mouth daily. 05/01/14  Yes Velvet Bathe, MD  calcitRIOL (ROCALTROL) 0.5 MCG capsule Take 0.5 mcg by mouth daily.   Yes [provider]  calcium carbonate (TUMS - DOSED IN MG ELEMENTAL CALCIUM) 500 MG chewable tablet Chew 2 tablets by mouth daily as needed for indigestion or heartburn.   Yes [provider]  FAMOTIDINE PO Take 1 tablet by mouth 2 (two) times daily as needed (gas).   Yes [provider]  fluticasone (FLONASE) 50 MCG/ACT nasal spray Place 2 sprays daily into both nostrils. 02/12/17  Yes Debbe Odea, MD  gabapentin (NEURONTIN) 300 MG capsule TAKE 1 CAPSULE(300 MG) BY MOUTH THREE TIMES DAILY AS NEEDED FOR PAIN 01/14/17  Yes Hoyt Koch, MD  hydrALAZINE (APRESOLINE) 25 MG tablet Take 25 mg by mouth 2 (two) times daily.   Yes [provider]  hydrocortisone (ANUSOL-HC) 2.5 % rectal cream Place 1 application rectally as needed. Patient taking differently: Place 1 application rectally daily as needed for hemorrhoids.  05/24/13  Yes Noralee Space, MD  labetalol (NORMODYNE) 200 MG tablet TAKE 2 TABLETS BY MOUTH TWICE DAILY Patient taking differently: 400mg  by mouth twice daily   Yes Noralee Space, MD  losartan (COZAAR) 100 MG tablet Take 1 tablet (100 mg total) by mouth daily. 04/20/17  Yes Truett Mainland, DO  ondansetron (ZOFRAN) 4 MG tablet Take 1 tablet (4 mg total) by mouth every 8 (eight) hours as needed for nausea or vomiting. 01/04/16  Yes Hoyt Koch, MD  Propylene Glycol  (SYSTANE BALANCE) 0.6 % SOLN Place 1 drop into both eyes daily as needed (for dry eyes).   Yes [provider]  spironolactone (ALDACTONE) 25 MG tablet Take 2 tablets (50 mg total) daily by mouth. 02/11/17  Yes Debbe Odea, MD  torsemide (DEMADEX) 20 MG tablet TAKE 2 TABLETS BY MOUTH EVERY MORNING Patient taking differently: 40mg  by mouth every evening 01/18/13  Yes Noralee Space, MD  traMADol (ULTRAM) 50 MG tablet Take 1 tablet (50 mg total) by mouth 3 (three) times daily as needed for severe pain. 12/16/16  Yes Duffy Bruce, MD  triamcinolone ointment (KENALOG) 0.5 % Apply 1 application topically 2 (two) times daily. Patient taking differently: Apply 1 application topically daily as needed (itching).  08/15/16  Yes Hoyt Koch, MD  pantoprazole (PROTONIX) 40 MG tablet Take 1 tablet (40 mg total) daily by mouth. Patient not taking: Reported on 05/22/2017 02/12/17   Debbe Odea, MD  PROCRIT 73419 UNIT/ML injection Inject 40,000 Units into the skin every 14 (fourteen) days. 06/26/16   [provider]    Inpatient Medications: Scheduled Meds: . aspirin EC  81 mg Oral Daily  . calcitRIOL  0.5 mcg Oral Daily  . enoxaparin (LOVENOX) injection  30 mg Subcutaneous Q24H  . fluticasone  2 spray Each Nare Daily  . furosemide  20 mg Intravenous Once  . furosemide  80 mg Intravenous Q12H  . gabapentin  300 mg Oral TID  . hydrALAZINE  25 mg Oral BID  . labetalol  400 mg Oral BID  . losartan  100 mg Oral Daily  . sodium chloride flush  3 mL Intravenous Q12H  . spironolactone  50 mg Oral Daily   Continuous Infusions: . sodium chloride    . nitroGLYCERIN 20 mcg/min (05/22/17 2305)   PRN Meds: sodium chloride, acetaminophen, albuterol, famotidine, hydrALAZINE, hydrocortisone, ondansetron (ZOFRAN) IV, polyvinyl alcohol, sodium chloride flush  Allergies:    Allergies  Allergen Reactions  . Minoxidil Palpitations and Other (See Comments)    unable to sleep  . Naproxen  Swelling    Social History:   Social History   Socioeconomic History  . Marital status: Married    Spouse name: Juanda Crumble  . Number of children: 1  . Years of education: 35  . Highest education level: Not on file  Social Needs  . Financial resource strain: Not on file  . Food insecurity - worry: Not on file  . Food insecurity - inability: Not on file  . Transportation needs - medical: Not on file  . Transportation needs - non-medical: Not on file  Occupational History  . Occupation: retired from [employer], [employer]  Tobacco Use  . Smoking status: Former Smoker    Packs/day: 0.40    Years: 15.00    Pack years: 6.00    Types: Cigarettes    Last attempt to quit: 04/08/1982    Years since quitting: 35.1  . Smokeless tobacco: Never Used  Substance and Sexual Activity  . Alcohol use: No  . Drug use: No  . Sexual activity: No  Other Topics Concern  . Not on file  Social History Narrative   Lives at home w/ her husband   Right-handed   Daily caffeine     Family History:   Family History  Problem Relation Age of Onset  . Diabetes Mother   . Colon polyps Mother   . Hypertension Mother   . Hypertension Brother   . Hypertension Sister   . Colon cancer Neg Hx    Family Status:  Family Status  Relation Name Status  . Father  Deceased       pt does not know her father  . Mother  Alive       DM, HBP  . Brother  Alive       DM, HBP, stroke  . Sister viola richardson Alive       hx of HBP  . Brother  (Not Specified)  . Sister  (Not Specified)  . MGM  Deceased  . MGF  Deceased  . PGM  Deceased  . PGF  Deceased  . Neg Hx  (Not Specified)    ROS:  Please see the history of present illness.  All other ROS reviewed and negative.     Physical Exam/Data:   Vitals:   05/23/17 0745 05/23/17 0830 05/23/17 0900 05/23/17 1100  BP:  (!) 151/58 (!) 147/52 (!) 104/47  Pulse: 82 84  80 71  Resp: 19 16 10 19   Temp:  97.6 F (36.4 C) 97.8 F (36.6 C) (!) 97.5 F (36.4 C)    TempSrc:  Oral Oral Oral  SpO2: 98% 97% 100% 97%  Weight:      Height:        Intake/Output Summary (Last 24 hours) at 05/23/2017 1202 Last data filed at 05/23/2017 2542 Gross per 24 hour  Intake 152.3 ml  Output 1700 ml  Net -1547.7 ml   Filed Weights   05/22/17 1057 05/23/17 0630  Weight: 196 lb (88.9 kg) 195 lb (88.5 kg)   Body mass index is 31.47 kg/m.   General: Well developed, well nourished, NAD Skin: Warm, dry, intact  Head: Normocephalic, atraumatic, clear, moist mucus membranes. Neck: Negative for carotid bruits. No JVD Lungs:Clear to ausculation bilaterally. No wheezes, rales, or rhonchi. Breathing is unlabored. Cardiovascular: RRR with S1 S2. No murmurs, rubs, gallops, or LV heave appreciated. Abdomen: Soft, non-tender, non-distended with normoactive bowel sounds. No obvious abdominal masses. MSK: Strength and tone appear normal for age. 5/5 in all extremities Extremities: No edema. No clubbing or cyanosis. DP/PT pulses 2+ bilaterally Neuro: Alert and oriented. No focal deficits. No facial asymmetry. MAE spontaneously. Psych: Responds to questions appropriately with normal affect.    EKG:  The EKG was personally reviewed and demonstrates:  05/22/17 NSR/ST HR 104 LBBB- Personally Reviewed Telemetry:  Telemetry was personally reviewed and demonstrates: 05/23/17 NSR HR 72  Relevant CV Studies:  ECHO: Echo 05/22/17:  Study Conclusions  - Pericardium, extracardiac: Large pericardial effusion surrounds heart, most prominent 23 mm posteriorly  Impressions: - Patient unable to lie flat to complete study Incomplete exam.  Patient sent to ED.  Echo 04/20/17: Study Conclusions  - Left ventricle: The cavity size was normal. Wall thickness was increased in a pattern of moderate LVH. Systolic function was normal. The estimated ejection fraction was in the range of 60% to 65%. Basal inferior severe hypokinesis. Doppler parameters are consistent with abnormal left  ventricular relaxation (grade 1 diastolic dysfunction). - Aortic valve: Trileaflet; mildly calcified leaflets. There was no stenosis. There was moderate to severe regurgitation. No holodiastolic flow reversal in the descending thoracic aorta. - Aorta: Mildly dilated aortic root and ascending aorta. Aortic   root dimension: 39 mm (ED). Ascending aortic diameter: 38 mm (S). - Mitral valve: There was mild to moderate regurgitation. - Left atrium: The atrium was moderately dilated. - Right ventricle: The cavity size was normal. Systolic function was normal. - Tricuspid valve: Peak RV-RA gradient (S): 41 mm Hg. - Pulmonary arteries: PA peak pressure: 56 mm Hg (S). - Systemic veins: IVC measured 2.9 cm with < 50% respirophasicvariation. - Pericardium, extracardiac: There was a moderate circumferential pericardial effusion. Somewhat larger than on last study. There was no significant respirophasic variation of mitral inflow E wave velocity. No RV diastolic collapse. Mild RA indentation. The IVC is dilated. Impressions:  - Normal LV size with moderate LV hypertrophy. EF 60-65%. Basal inferior hypokinesis. Moderate to severe aortic regurgitation, holodiastolic flow reversal was not noted in the descending thoracic aorta. Mild to moderate MR. Moderate pulmonary hypertension. Normal RV size and systolic function. Moderate pericardial effusion (was small on prior echo). I do not think there is tamponade present. The IVC is dilated but suspect this is due to volume overload/CHF.  Echo 02/05/17: Study Conclusions - Left ventricle: The cavity size was normal. Wall thickness was increased in a pattern of moderate LVH. Systolic function was normal. The estimated  ejection fraction was in the range of 60% to 65%. Wall motion was normal; there were no regional wall motion abnormalities. The study is not technically sufficient to allow evaluation of LV diastolic function. - Aortic valve: Poorly visualized. Mildly  calcified leaflets. Very mild aortic stenosis. Moderate regurgitation. Mean gradient (S):20 mm Hg. Peak gradient (S): 34 mm Hg. Valve area (Vmean): 2.46 cm^2. Regurgitation pressure half-time: 219 ms. - Mitral valve: Calcified annulus. Mildly thickened leaflets .   There was mild, posteriorly directed regurgitation. - Left atrium: The atrium was mildly dilated. - Tricuspid valve: There was moderate regurgitation. - Pulmonary arteries: PA peak pressure: 65 mm Hg (S). - Inferior vena cava: The vessel was normal in size. The   respirophasic diameter changes were in the normal range (>= 50%), consistent with normal central venous pressure. - Pericardium, extracardiac: Small circumferential pericardial effusion. No clear tamponade features.  Impressions: - Compared to a prior study in 11/2016, there appear to be no significant changes. Consider TEE to better visualize the aortic valve - there is at least moderate AI and very mild AS.  CATH: NA  Laboratory Data:  Chemistry Recent Labs  Lab 05/22/17 1102 05/23/17 0327  NA 143 143  K 4.0 3.8  CL 103 101  CO2 28 29  GLUCOSE 118* 99  BUN 32* 32*  CREATININE 2.62* 2.53*  CALCIUM 9.7 9.7  GFRNONAA 17* 18*  GFRAA 20* 21*  ANIONGAP 12 13    Total Protein  Date Value Ref Range Status  04/24/2017 6.5 6.0 - 8.5 g/dL Final  03/05/2017 7.3 6.4 - 8.3 g/dL Final   Albumin  Date Value Ref Range Status  04/29/2017 3.5 3.5 - 5.2 g/dL Final  03/05/2017 3.1 (L) 3.5 - 5.0 g/dL Final   AST  Date Value Ref Range Status  03/26/2017 36 (A) 13 - 35 Final  03/05/2017 30 5 - 34 U/L Final   ALT  Date Value Ref Range Status  03/26/2017 25 7 - 35 Final  03/05/2017 26 0 - 55 U/L Final   Alkaline Phosphatase  Date Value Ref Range Status  03/26/2017 76 25 - 125 Final  03/05/2017 78 40 - 150 U/L Final   Total Bilirubin  Date Value Ref Range Status  03/05/2017 0.51 0.20 - 1.20 mg/dL Final   Hematology Recent Labs  Lab 05/22/17 1102  WBC  7.4  RBC 3.40*  HGB 10.4*  HCT 32.9*  MCV 96.8  MCH 30.6  MCHC 31.6  RDW 17.1*  PLT 200   Cardiac Enzymes Recent Labs  Lab 05/22/17 1827 05/22/17 2123 05/23/17 0327 05/23/17 0941  TROPONINI 0.03* 0.28* 0.25* 0.21*    Recent Labs  Lab 05/22/17 1352  TROPIPOC 0.12*    BNP Recent Labs  Lab 05/22/17 1102  BNP 800.0*    DDimer No results for input(s): DDIMER in the last 168 hours. TSH:  Lab Results  Component Value Date   TSH 1.17 10/18/2014   Lipids: Lab Results  Component Value Date   CHOL 141 02/04/2017   HDL 49 02/04/2017   LDLCALC 76 02/04/2017   TRIG 78 02/04/2017   CHOLHDL 2.9 02/04/2017   HgbA1c: Lab Results  Component Value Date   HGBA1C 5.5 02/04/2017    Radiology/Studies:  Dg Chest 2 View  Result Date: 05/22/2017 CLINICAL DATA:  Shortness of breath. EXAM: CHEST  2 VIEW COMPARISON:  Radiographs of April 19, 2017. FINDINGS: Stable severe cardiomegaly is noted. Stable central pulmonary vascular congestion is noted. Interstitial densities are noted  in both lung bases concerning for pulmonary edema. No pneumothorax or significant pleural effusion is noted. Bony thorax is unremarkable. IMPRESSION: Stable cardiomegaly with central pulmonary vascular congestion. Probable bilateral pulmonary edema is noted. Electronically Signed   By: Marijo Conception, M.D.   On: 05/22/2017 11:32    Assessment and Plan:   1. Acute on chronic diastolic heart failure: -Breathing improved, no s/s of fluid volume overload on exam  -EF 60-65% per echo 04/20/17 -Weight, 195lb, 195lb on admission  -I&O, net negative 1.5L  -Continue Lasix 80 mg BID, spironolactone  2. Severe Aortic Insufficiency: -Dyspnea improved from admission  -Stat echo 05/23/17 completed. MD at bedside for evaluation  -Will continue Lasix diuretic therapy over the weekend and tentatively plan for R/L cath on Monday 05/26/17  -Will need TEE at some point>> then TCTS for surgery consult for severe AI  repair  3. HTN: -Improved with NTG gtt, 146/73>104/47>147/52 -Losartan, labetolol  4. CKD stage IV: -Worsening, but stable>>Cr, 2.53, 2.62 -Baseline Cr 2.0 range -Will need to monitor closely with planned cath. She post procedure hydration.   5. Elevated troponin: -Trop, 0.03, 0.28, 0.25, 0.21 -Most likely demand ischemia given that she has had no anginal symptoms -Of note, it appears that her baseline trop levels are mildly elevated  For questions or updates, please contact [facility] Please consult [email or web address] for contact info under Cardiology/STEMI.   SignedKathyrn Drown NP-C [place] Pager: [phone] 05/23/2017 12:02 PM  I have seen and examined the patient along with Kathyrn Drown NP-C.  I have reviewed the chart, notes and new data.  I agree with NP's note.  Key new complaints: dyspnea has improved a lot with diuretics. No dizziness or syncope Key examination changes: systolic and diastolic aortic murmurs are heard. Unable to elicit pulsus paradoxus. Has marked lower extremity edema, but she reports it is "less than usual". Hard to see jugular veins Key new findings / data: reviewed echo. Normal LVEF with severe LVH and LBBB related dyssynchrony. Although there is a moderate to large pericardial effusion, it is not much changed since last month. There are incomplete signs of tamponade. There is severe aortic insufficiency and there is evidence for elevated right and left heart pressures. Creat 2.5 seems to be close to her baseline. CXR yesterday showed pulmonary edema and unchanged cardiomegaly. Note increase in Ig kappa and lambda free light chains with borderline abnormal ratio in Nov 2018. No monoclonal protein was identified. Likely nonspecific increase due to CKD.  PLAN:  Most data suggest that the dominant problem is decompensated diastolic heart failure due to severe AI and severe hypertrophic cardiomyopathy (secondary to HTN, much less likely  amyloidosis). The exact mechanism of AI is unclear. Suspicion for aneurysmal dilation of the ostial-proximal RCA versus aneurysm of the right sinus of Valsalva. Tamponade is not present clinically and there is at most equivocal echo evidence for tamponade.  - Continue diuresis, watch for hypotension and monitor renal function. - When she is able to lie flat (logistically on Monday), recommend TEE to evaluate the aortic valve and R&L heart cath. Use minimum possible contrast amount due to advanced CKD. She is not diabetic. - Suspect she will require aortic valve surgery and the pericardial effusion could be drained at that time. - Repeat assessment for AL amyloidosis? Will discuss with Dr. Beryle Beams.   Sanda Klein, MD, [place] [phone] 05/23/2017, 2:05 PM

## 2017-05-23 NOTE — Progress Notes (Signed)
  Echocardiogram 2D Echocardiogram has been performed.  Matilde Bash 05/23/2017, 1:19 PM

## 2017-05-23 NOTE — Consult Note (Signed)
   [facility] CM Inpatient Consult   05/23/2017  Maty Zeisler [birth year] [id number]    Patient screened for potential [facility] Care Management services due to multiple hospitalizations.  Chart reviewed. Mrs. [facility] MD office is listed for doing their own transition of care calls post hospital discharge.  Spoke with inpatient RNCM. Patient currently on stepdown. Discussed that writer will follow back up at a more appropriate time to discuss potential [place] Management program.   Marthenia Rolling, MSN-Ed, RN,BSN [facility] Liaison [phone]

## 2017-05-23 NOTE — Progress Notes (Signed)
PROGRESS NOTE    [name]  LKG:[id number] DOB: 1945/04/11 DOA: 05/22/2017 PCP: Hoyt Koch, MD  Outpatient Specialists:   Brief Narrative: Patient is a 72 year old African-American female, obese, with past medical history significant for chronic kidney disease stage IV, diastolic congestive heart failure, hypertension, chronic pericardial effusion, moderate to severe aortic incompetence, mild to moderate mitral regurgitation, and pulmonary hypertension.  According to the patient, she has been having worsening shortness of breath, dyspnea on exertion, orthopnea and leg edema.  The symptoms became so severe yesterday when she went for an outpatient echocardiogram and CT scan of the lung.  Apparently, the patient has been following up with the pulmonologist who is trying to rule out possible pulmonary disease.  The patient is also known to the cardiology team.  The patient was admitted with acute on chronic diastolic congestive heart failure, severe pericardial effusion, accelerated hypertension, and mildly elevated troponin that could easily be type II elevation.  With diuresis, the patient's symptoms are improving.  Cardiology and cardiothoracic teams have been consulted to assist with patient's management.  Cardiology team maintains possible TEE, right and left cardiac current very early next week if feasible (possibly on 05/26/2017).  Possible valve replacement surgery and draining of the pericardial fluid at the same time remains a possibility.  Echocardiogram is being pursued.  Apparently, last echo done was not complete.  At some point, when the patient is able to lay down flat, CT of the chest will be considered as well.  No headache, no neck pain, no chest pain, no fever or chills and no GI symptoms.  His shortness of breath is improving.  The patient's leg edema is also improving.  The patient reports overall, mild improvement.   Assessment & Plan:   Active Problems:  Hypertensive emergency    Acute on chronic diastolic congestive heart failure: -Continue IV Lasix 80 mg twice daily. -Continue Aldactone. -Complete echocardiogram. -Optimize blood pressure control. -Continue nitro drip for now.  Hypertensive urgency: -This could be related to significant dyspnea following CHF exacerbation. -Possibly, this could have also led to the patient's heart failure exacerbation. -Cautiously optimize blood pressure. -Hopefully, weight improvement in patient's dyspnea, the blood pressure will continue to improve. -Cautious use of nitro drip.  Chronic kidney disease stage IV, with possible acute kidney injury, possibly cardiorenal: -There has been mild interval improvement in patient's renal function with diuresis. -Optimize congestive heart failure management. -Avoid nephrotoxins. -Dose all medications considering renal impairment. -Nephro protective measures if cardiac catheterization is pursued.  Valvular heart disease: -Patient has severe aortic incompetence. -Patient has moderate mitral regurgitation. -Cardiology and cardiothoracic input is appreciated. -For possible valve replacement surgery.  Severe pericardial effusion: -Etiology remains uncertain. -As per cardiology, drainage can be done at the time of volvulus surgery if possible. -Continue to monitor for tamponade. -Guarded prognosis.  Acute respiratory failure with hypoxia: -This likely multifactorial (likely secondary to combined effect of above). -Low threshold to consult pulmonary team to complete pulmonary workup.  GERD: Cont H2 blocker  Dry eyes: Cont eye drops  Allergies: Cont flonase  Chronic pain: Cont gabapentin Hold tramadol  Code Status:  Full.    DVT Prophylaxis: lovenox Family Communication:  Disposition Plan:  This will depend on hospital course.     Consultants:   Cardiology.    Cardiothoracic surgery.  Procedures:   Incomplete  echo.  Antimicrobials:   None   Subjective: Patient continues to have shortness of breath, dull, improved. No chest pain. No fever or chills.  Objective: Vitals:   05/23/17 0900 05/23/17 1100 05/23/17 1200 05/23/17 1612  BP: (!) 147/52 (!) 104/47 (!) 146/73 (!) 151/56  Pulse: 80 71 83 79  Resp: 10 19  17   Temp: 97.8 F (36.6 C) (!) 97.5 F (36.4 C)  97.7 F (36.5 C)  TempSrc: Oral Oral  Oral  SpO2: 100% 97% 99% 97%  Weight:      Height:        Intake/Output Summary (Last 24 hours) at 05/23/2017 1743 Last data filed at 05/23/2017 1400 Gross per 24 hour  Intake 217.2 ml  Output 2000 ml  Net -1782.8 ml   Filed Weights   05/22/17 1057 05/23/17 0630  Weight: 196 lb (88.9 kg) 195 lb (88.5 kg)    Examination:  General exam: Patient is acutely ill looking.  Patient is obese.   Respiratory system: Decreased air entry.   Cardiovascular system: S1 & S2, systolic and diastolic murmur.  Leg edema.   Gastrointestinal system: Abdomen is obese, soft and nontender.  Organs are difficult to assess.  Central nervous system: Alert and oriented. No focal neurological deficits. Extremities: Bilateral leg edema..  Data Reviewed: I have personally reviewed following labs and imaging studies  CBC: Recent Labs  Lab 05/22/17 1102  WBC 7.4  HGB 10.4*  HCT 32.9*  MCV 96.8  PLT 786   Basic Metabolic Panel: Recent Labs  Lab 05/22/17 1102 05/23/17 0327  NA 143 143  K 4.0 3.8  CL 103 101  CO2 28 29  GLUCOSE 118* 99  BUN 32* 32*  CREATININE 2.62* 2.53*  CALCIUM 9.7 9.7   GFR: Estimated Creatinine Clearance: 22.9 mL/min (A) (by C-G formula based on SCr of 2.53 mg/dL (H)). Liver Function Tests: No results for input(s): AST, ALT, ALKPHOS, BILITOT, PROT, ALBUMIN in the last 168 hours. No results for input(s): LIPASE, AMYLASE in the last 168 hours. No results for input(s): AMMONIA in the last 168 hours. Coagulation Profile: No results for input(s): INR, PROTIME in the last  168 hours. Cardiac Enzymes: Recent Labs  Lab 05/22/17 1827 05/22/17 2123 05/23/17 0327 05/23/17 0941  TROPONINI 0.03* 0.28* 0.25* 0.21*   BNP (last 3 results) Recent Labs    11/27/16 1036  PROBNP 202.0*   HbA1C: No results for input(s): HGBA1C in the last 72 hours. CBG: No results for input(s): GLUCAP in the last 168 hours. Lipid Profile: No results for input(s): CHOL, HDL, LDLCALC, TRIG, CHOLHDL, LDLDIRECT in the last 72 hours. Thyroid Function Tests: No results for input(s): TSH, T4TOTAL, FREET4, T3FREE, THYROIDAB in the last 72 hours. Anemia Panel: No results for input(s): VITAMINB12, FOLATE, FERRITIN, TIBC, IRON, RETICCTPCT in the last 72 hours. Urine analysis:    Component Value Date/Time   COLORURINE STRAW (A) 02/05/2017 2215   APPEARANCEUR CLEAR 02/05/2017 2215   LABSPEC 1.006 02/05/2017 2215   PHURINE 5.0 02/05/2017 2215   GLUCOSEU NEGATIVE 02/05/2017 2215   GLUCOSEU NEGATIVE 12/08/2015 1024   HGBUR NEGATIVE 02/05/2017 2215   BILIRUBINUR NEGATIVE 02/05/2017 2215   BILIRUBINUR neg 01/04/2016 1054   [place] 02/05/2017 2215   PROTEINUR NEGATIVE 02/05/2017 2215   UROBILINOGEN negative 01/04/2016 1054   UROBILINOGEN 0.2 12/08/2015 1024   NITRITE NEGATIVE 02/05/2017 2215   LEUKOCYTESUR NEGATIVE 02/05/2017 2215   Sepsis Labs: @LABRCNTIP (procalcitonin:4,lacticidven:4)  )No results found for this or any previous visit (from the past 240 hour(s)).       Radiology Studies: Dg Chest 2 View  Result Date: 05/22/2017 CLINICAL DATA:  Shortness of breath. EXAM:  CHEST  2 VIEW COMPARISON:  Radiographs of April 19, 2017. FINDINGS: Stable severe cardiomegaly is noted. Stable central pulmonary vascular congestion is noted. Interstitial densities are noted in both lung bases concerning for pulmonary edema. No pneumothorax or significant pleural effusion is noted. Bony thorax is unremarkable. IMPRESSION: Stable cardiomegaly with central pulmonary vascular  congestion. Probable bilateral pulmonary edema is noted. Electronically Signed   By: Marijo Conception, M.D.   On: 05/22/2017 11:32        Scheduled Meds: . aspirin EC  81 mg Oral Daily  . calcitRIOL  0.5 mcg Oral Daily  . enoxaparin (LOVENOX) injection  30 mg Subcutaneous Q24H  . fluticasone  2 spray Each Nare Daily  . furosemide  80 mg Intravenous Q12H  . gabapentin  300 mg Oral TID  . hydrALAZINE  25 mg Oral BID  . labetalol  400 mg Oral BID  . losartan  100 mg Oral Daily  . sodium chloride flush  3 mL Intravenous Q12H  . spironolactone  50 mg Oral Daily   Continuous Infusions: . sodium chloride    . nitroGLYCERIN 20 mcg/min (05/22/17 2305)     LOS: 1 day    Time spent: 40 minutes.    Dana Allan, MD  [facility] Pager #: [phone] 7PM-7AM contact night coverage as above

## 2017-05-23 NOTE — Consult Note (Signed)
[address]       [place] [address]             [phone]        Antoria Mustapha [facility] Medical Record #[id number] Date of Birth: 1945/05/04  Referring: No ref. provider found Primary Care: Hoyt Koch, MD Primary Cardiologist:James Hochrein, MD  Chief Complaint: Worsening shortness of breath  History of Present Illness:    Patient is a 72 year old female with known history of diastolic heart failure, aortic insufficiency and known chronic pericardial effusion that is being monitored by cardiology.  Her primary cardiologist is Dr. Percival Spanish.  She presented to the emergency department yesterday with increasing shortness of breath.  She was found to be hypoxic with saturations in the 80s.  Her dyspnea is felt to be multifactorial and she sees both cardiology and chronic kidney disease as well.  An attempt was made to obtain a chest CT on the date of admission but she was unable to lie flat and was sent to the emergency department.  She denied chest pain, cough or fever.  She has chronic lower extremity edema.  She has had increasing trouble with sleeping due to shortness of breath.  She does have orthopnea.  She was evaluated in the emergency department to require further management as an inpatient.  We are consulted for consideration of pericardial window due to the a large effusion.     Current Activity/ Functional Status: Limited due to chronic deconditioning and multiple comorbidities   Zubrod Score: At the time of surgery this patient's most appropriate activity status/level should be described as: []     0    Normal activity, no symptoms []     1    Restricted in physical strenuous activity but ambulatory, able to do out light work [x]     2    Ambulatory and capable of self care, unable to do work activities, up and about                 more than 50%  Of the time                            []     3    Only limited self care, in bed greater than 50%  of waking hours []     4    Completely disabled, no self care, confined to bed or chair []     5    Moribund  Past Medical History:  Diagnosis Date  . Acute pancreatitis 02/04/2017   [name] 02/04/2017  . Adenomatous colon polyp 01/2002  . Anemia, chronic disease    /notes 02/04/2017  . Aortic valve disease    AI/AS  . Arthritis    "lower back, knees" (02/04/2017)  . CHF (congestive heart failure) ([place])   . CKD (chronic kidney disease), stage III ([place])   . Diverticulosis of colon   . DJD (degenerative joint disease)   . Frequent UTI   . Hemorrhoids   . Hypertension   . Lumbar back pain   . Neuropathy   . Obesity   . Pericardial effusion     in a patient with Diastolic heart  failure and Pericardial effusion  known since last 2 D echo in 11/2016 /notes 02/04/2017  . PONV (postoperative nausea and vomiting)   . Pulmonary HTN ([place])   . Renal cyst   . Venous insufficiency   .  Vitamin D deficiency     Past Surgical History:  Procedure Laterality Date  . ABDOMINAL HYSTERECTOMY     "partial"  . COLONOSCOPY W/ BIOPSIES AND POLYPECTOMY     "bx was ok"    Social History   Tobacco Use  Smoking Status Former Smoker  . Packs/day: 0.40  . Years: 15.00  . Pack years: 6.00  . Types: Cigarettes  . Last attempt to quit: 04/08/1982  . Years since quitting: 35.1  Smokeless Tobacco Never Used    Social History   Substance and Sexual Activity  Alcohol Use No    Social History   Socioeconomic History  . Marital status: Married    Spouse name: Juanda Crumble  . Number of children: 1  . Years of education: 70  . Highest education level: Not on file  Social Needs  . Financial resource strain: Not on file  . Food insecurity - worry: Not on file  . Food insecurity - inability: Not on file  . Transportation needs - medical: Not on file  . Transportation needs - non-medical: Not on file  Occupational History  . Occupation: retired from [employer], [employer]  Tobacco Use  . Smoking status:  Former Smoker    Packs/day: 0.40    Years: 15.00    Pack years: 6.00    Types: Cigarettes    Last attempt to quit: 04/08/1982    Years since quitting: 35.1  . Smokeless tobacco: Never Used  Substance and Sexual Activity  . Alcohol use: No  . Drug use: No  . Sexual activity: No  Other Topics Concern  . Not on file  Social History Narrative   Lives at home w/ her husband   Right-handed   Daily caffeine     Allergies  Allergen Reactions  . Minoxidil Palpitations and Other (See Comments)    unable to sleep  . Naproxen Swelling    Current Facility-Administered Medications  Medication Dose Route Frequency Provider Last Rate Last Dose  . 0.9 %  sodium chloride infusion  250 mL Intravenous PRN Elwin Mocha, MD      . acetaminophen (TYLENOL) tablet 650 mg  650 mg Oral Q4H PRN Elwin Mocha, MD      . albuterol (PROVENTIL) (2.5 MG/3ML) 0.083% nebulizer solution 2.5 mg  2.5 mg Nebulization Q2H PRN Elwin Mocha, MD      . aspirin EC tablet 81 mg  81 mg Oral Daily Elwin Mocha, MD   81 mg at 05/23/17 0948  . calcitRIOL (ROCALTROL) capsule 0.5 mcg  0.5 mcg Oral Daily Elwin Mocha, MD   0.5 mcg at 05/23/17 0949  . enoxaparin (LOVENOX) injection 30 mg  30 mg Subcutaneous Q24H Hammons, Theone Murdoch, RPH      . famotidine (PEPCID) tablet 10 mg  10 mg Oral BID PRN Elwin Mocha, MD      . fluticasone [facility]) 50 MCG/ACT nasal spray 2 spray  2 spray Each Nare Daily Elwin Mocha, MD   2 spray at 05/23/17 1700  . furosemide (LASIX) injection 80 mg  80 mg Intravenous Q12H Dana Allan I, MD      . gabapentin (NEURONTIN) capsule 300 mg  300 mg Oral TID Elwin Mocha, MD   Stopped at 05/22/17 2234  . hydrALAZINE (APRESOLINE) injection 10 mg  10 mg Intravenous Q8H PRN Elwin Mocha, MD   10 mg at 05/22/17 1442  . hydrALAZINE (APRESOLINE) tablet 25 mg  25 mg Oral BID Aggie Moats,  Layne Benton, MD   25 mg at 05/23/17 0949  . hydrocortisone (ANUSOL-HC) 2.5 % rectal cream 1  application  1 application Rectal Daily PRN Elwin Mocha, MD      . labetalol (NORMODYNE) tablet 400 mg  400 mg Oral BID Elwin Mocha, MD   400 mg at 05/23/17 0948  . losartan (COZAAR) tablet 100 mg  100 mg Oral Daily Elwin Mocha, MD   100 mg at 05/23/17 3299  . nitroGLYCERIN 50 mg in dextrose 5 % 250 mL (0.2 mg/mL) infusion  0-200 mcg/min Intravenous Titrated Elwin Mocha, MD 6 mL/hr at 05/22/17 2305 20 mcg/min at 05/22/17 2305  . ondansetron (ZOFRAN) injection 4 mg  4 mg Intravenous Q6H PRN Elwin Mocha, MD      . polyvinyl alcohol (LIQUIFILM TEARS) 1.4 % ophthalmic solution 1 drop  1 drop Both Eyes Daily PRN Elwin Mocha, MD      . sodium chloride flush (NS) 0.9 % injection 3 mL  3 mL Intravenous Q12H Elwin Mocha, MD      . sodium chloride flush (NS) 0.9 % injection 3 mL  3 mL Intravenous PRN Elwin Mocha, MD      . spironolactone (ALDACTONE) tablet 50 mg  50 mg Oral Daily Elwin Mocha, MD   50 mg at 05/23/17 2426    Medications Prior to Admission  Medication Sig Dispense Refill Last Dose  . amLODipine (NORVASC) 10 MG tablet Take 1 tablet (10 mg total) by mouth daily. 30 tablet 0 05/21/2017 at Unknown time  . calcitRIOL (ROCALTROL) 0.5 MCG capsule Take 0.5 mcg by mouth daily.   05/21/2017 at Unknown time  . calcium carbonate (TUMS - DOSED IN MG ELEMENTAL CALCIUM) 500 MG chewable tablet Chew 2 tablets by mouth daily as needed for indigestion or heartburn.   unk at [employer]  . FAMOTIDINE PO Take 1 tablet by mouth 2 (two) times daily as needed (gas).   unk at [employer]  . fluticasone (FLONASE) 50 MCG/ACT nasal spray Place 2 sprays daily into both nostrils. 16 g 2 05/21/2017 at Unknown time  . gabapentin (NEURONTIN) 300 MG capsule TAKE 1 CAPSULE(300 MG) BY MOUTH THREE TIMES DAILY AS NEEDED FOR PAIN 90 capsule 0 unk at unk  . hydrALAZINE (APRESOLINE) 25 MG tablet Take 25 mg by mouth 2 (two) times daily.   05/21/2017 at Unknown time  . hydrocortisone (ANUSOL-HC) 2.5 % rectal  cream Place 1 application rectally as needed. (Patient taking differently: Place 1 application rectally daily as needed for hemorrhoids. ) 30 g 0 unk at unk  . labetalol (NORMODYNE) 200 MG tablet TAKE 2 TABLETS BY MOUTH TWICE DAILY (Patient taking differently: 400mg  by mouth twice daily) 120 tablet 0 05/21/2017 at Unknown time  . losartan (COZAAR) 100 MG tablet Take 1 tablet (100 mg total) by mouth daily. 30 tablet 11 05/21/2017 at Unknown time  . ondansetron (ZOFRAN) 4 MG tablet Take 1 tablet (4 mg total) by mouth every 8 (eight) hours as needed for nausea or vomiting. 20 tablet 0 unk at unk  . Propylene Glycol (SYSTANE BALANCE) 0.6 % SOLN Place 1 drop into both eyes daily as needed (for dry eyes).   unk at [employer]  . spironolactone (ALDACTONE) 25 MG tablet Take 2 tablets (50 mg total) daily by mouth. 30 tablet 6 05/21/2017 at Unknown time  . torsemide (DEMADEX) 20 MG tablet TAKE 2 TABLETS BY MOUTH EVERY MORNING (Patient taking differently: 40mg  by mouth every evening)  60 tablet 6 05/21/2017 at Unknown time  . traMADol (ULTRAM) 50 MG tablet Take 1 tablet (50 mg total) by mouth 3 (three) times daily as needed for severe pain. 15 tablet 0 unk at unk  . triamcinolone ointment (KENALOG) 0.5 % Apply 1 application topically 2 (two) times daily. (Patient taking differently: Apply 1 application topically daily as needed (itching). ) 100 g 3 unk at unk  . pantoprazole (PROTONIX) 40 MG tablet Take 1 tablet (40 mg total) daily by mouth. (Patient not taking: Reported on 05/22/2017) 30 tablet 0 Not Taking at Unknown time  . PROCRIT 23300 UNIT/ML injection Inject 40,000 Units into the skin every 14 (fourteen) days.  3 Not Taking    Family History  Problem Relation Age of Onset  . Diabetes Mother   . Colon polyps Mother   . Hypertension Mother   . Hypertension Brother   . Hypertension Sister   . Colon cancer Neg Hx          Cardiac Review of Systems: Y or  [    ]= no  Chest Pain [    ]  Resting SOB [+    ] Exertional SOB  [+  ]  Orthopnea [ ++  ]    Palpitations [  ] Syncope  [  ]   Presyncope [   ]  General Review of Systems: [Y] = yes [  ]=no Constitional: recent weight change [  ]; anorexia [  ]; fatigue [  ]; nausea [  ]; night sweats [  ]; fever [  ]; or chills [  ]                                                               Dental: poor dentition[  ]; Last Dentist visit:   Eye : blurred vision [  ]; diplopia [   ]; vision changes [  ];  Amaurosis fugax[  ]; Resp: cough [ + ];  wheezing[  ];  hemoptysis[  ]; shortness of breath[  ]; paroxysmal nocturnal dyspnea[  ]; dyspnea on exertion[  ]; or orthopnea[  ];  GI:  gallstones[  ], vomiting[  ];  dysphagia[  ]; melena[  ];  hematochezia [  ]; heartburn[  ];   Hx of  Colonoscopy[  ]; GU: kidney stones [  ]; hematuria[  ];   dysuria [  ];  nocturia[  ];  history of     obstruction [  ]; urinary frequency [  ]             Skin: rash, swelling[  ];, hair loss[  ];  peripheral edema[  ];  or itching[  ]; Musculosketetal: myalgias[  ];  joint swelling[  ];  joint erythema[  ];  joint pain[  ];  back pain[  ];  Heme/Lymph: bruising[  ];  bleeding[  ];  anemia[  ];  Neuro: TIA[  ];  headaches[  ];  stroke[  ];  vertigo[  ];  seizures[  ];   paresthesias[  ];  difficulty walking[  ];  Psych:depression[  ]; anxiety[  ];  Endocrine: diabetes[  ];  thyroid dysfunction[  ];  Immunizations: Flu [  ]; Pneumococcal[  ];    Physical Exam: BP Marland Kitchen)  147/52 (BP Location: Left Arm)   Pulse 80   Temp 97.8 F (36.6 C) (Oral)   Resp 10   Ht 5\' 6"  (1.676 m)   Wt 195 lb (88.5 kg)   SpO2 100%   BMI 31.47 kg/m    General appearance: alert, cooperative and no distress Head: Normocephalic, without obvious abnormality, atraumatic Neck: no adenopathy, no carotid bruit, no JVD, supple, symmetrical, trachea midline and thyroid not enlarged, symmetric, no tenderness/mass/nodules Lymph nodes: Cervical, supraclavicular, and axillary nodes normal. Resp: clear to  auscultation bilaterally Back: symmetric, no curvature. ROM normal. No CVA tenderness. Cardio: + 2/6sysyolic murmur, RRR GI: soft, non-tender; bowel sounds normal; no masses,  no organomegaly Extremities: + BLE pitting edema Neurologic: Grossly normal  Diagnostic Studies & Laboratory data:     Recent Radiology Findings:   Dg Chest 2 View  Result Date: 05/22/2017 CLINICAL DATA:  Shortness of breath. EXAM: CHEST  2 VIEW COMPARISON:  Radiographs of April 19, 2017. FINDINGS: Stable severe cardiomegaly is noted. Stable central pulmonary vascular congestion is noted. Interstitial densities are noted in both lung bases concerning for pulmonary edema. No pneumothorax or significant pleural effusion is noted. Bony thorax is unremarkable. IMPRESSION: Stable cardiomegaly with central pulmonary vascular congestion. Probable bilateral pulmonary edema is noted. Electronically Signed   By: Marijo Conception, M.D.   On: 05/22/2017 11:32                            *[facility]*                        [address], [place] [address]                            [phone]  ------------------------------------------------------------------- Transthoracic Echocardiography  Patient:    [name], [name] MR #:       [id number] Study Date: 05/22/2017 Gender:     F Age:        56 Height:     167.6 cm Weight:     89.4 kg BSA:        2.07 m^2 Pt. Status: Room:   ATTENDING    Minus Breeding, MD  ORDERING     Minus Breeding, MD  REFERRING    Minus Breeding, MD  SONOGRAPHER  Marygrace Drought, RCS  PERFORMING   [facility], Outpatient  cc:  -------------------------------------------------------------------  ------------------------------------------------------------------- Indications:      Pericardial Effusion (I31.3).  ------------------------------------------------------------------- History:   PMH:  CKD, AVD, Pulmonary HTN.  Risk  factors: Hypertension.  ------------------------------------------------------------------- Study Conclusions  - Pericardium, extracardiac: Large pericardial effusion surrounds   heart, most prominent 23 mm posteriorly  Impressions:  - Patient unable to lie flat to complete study Incomplete exam .   Patient sent to ED.  ------------------------------------------------------------------- Study data:  Comparison was made to the study of 04/20/2017.  Study status:  Routine.  Procedure:  The patient reported no pain pre or post test. Transthoracic echocardiography. Image quality was adequate.          Transthoracic echocardiography.  M-mode, complete 2D, spectral Doppler, and color Doppler.  Birthdate: Patient birthdate: 10/12/1945.  Age:  Patient is 73 yr old.  Sex: Gender: female.    BMI: 31.8 kg/m^2.  Blood pressure:     196/84 Patient status:  Outpatient.  Study date:  Study date: 05/22/2017. Study time: 09:46 AM.  Location:  [facility] [facility]  -------------------------------------------------------------------  ------------------------------------------------------------------- Aortic valve:   Mildly thickened leaflets.  Doppler:  There was no regurgitation.  ------------------------------------------------------------------- Pericardium:  Large pericardial effusion surrounds heart, most prominent 23 mm posteriorly  ------------------------------------------------------------------- Measurements   Left ventricle                    Value    Reference  LV ID, ED, PLAX chordal           50.8  mm 43 - 52  LV PW thickness, ED               20.6  mm ---------  IVS/LV PW ratio, ED               0.84     <=1.3    Ventricular septum                Value    Reference  IVS thickness, ED                 17.3  mm ---------  Legend: (L)  and  (H)  mark values outside specified reference  range.  ------------------------------------------------------------------- Prepared and Electronically Authenticated by  Dorris Carnes, M.D. 2019-02-14T12:17:04 I have independently reviewed the above radiologic studies.  Recent Lab Findings: Lab Results  Component Value Date   WBC 7.4 05/22/2017   HGB 10.4 (L) 05/22/2017   HCT 32.9 (L) 05/22/2017   PLT 200 05/22/2017   GLUCOSE 99 05/23/2017   CHOL 141 02/04/2017   TRIG 78 02/04/2017   HDL 49 02/04/2017   LDLCALC 76 02/04/2017   ALT 25 03/26/2017   AST 36 (A) 03/26/2017   NA 143 05/23/2017   K 3.8 05/23/2017   CL 101 05/23/2017   CREATININE 2.53 (H) 05/23/2017   BUN 32 (H) 05/23/2017   CO2 29 05/23/2017   TSH 1.17 10/18/2014   INR 1.16 02/05/2017   HGBA1C 5.5 02/04/2017      Assessment / Plan: The patient has multifactorial dyspnea and is being evaluated by both pulmonology and cardiology.  She does have significant aortic insufficiency on echocardiogram.  She also has mild to moderate mitral regurgitation.  She has had fairly good symptom response with diuretics and is currently less short of breath than yesterday.  It is unclear if pericardial window would provide definitive symptom relief at this time.  She probably would benefit from left and right heart catheterization as well as finalizing her pulmonology evaluation testing.  This was limited yesterday by dyspnea and she was unable to get her chest CT or echocardiogram.     I  spent 30 minutes counseling the patient face to face.    05/23/2017 10:18 AM  Patient examined and 2D echo images reviewed Patient has a moderate chronic  pericardial effusion from diastolic HF Her symptoms are most likely from severe AI, pulm edema Would not rec pericardial window at this time Agree with plan for R/L heart cath Cardiac CT to assess possible sinus valsalva aneurysm  would be helpful after she recovers from cath dye load  P Prescott Gum MD

## 2017-05-24 DIAGNOSIS — R0902 Hypoxemia: Secondary | ICD-10-CM

## 2017-05-24 DIAGNOSIS — I5032 Chronic diastolic (congestive) heart failure: Secondary | ICD-10-CM

## 2017-05-24 DIAGNOSIS — I10 Essential (primary) hypertension: Secondary | ICD-10-CM

## 2017-05-24 DIAGNOSIS — R0602 Shortness of breath: Secondary | ICD-10-CM

## 2017-05-24 LAB — BASIC METABOLIC PANEL
Anion gap: 12 (ref 5–15)
BUN: 34 mg/dL — ABNORMAL HIGH (ref 6–20)
CO2: 29 mmol/L (ref 22–32)
CREATININE: 2.7 mg/dL — AB (ref 0.44–1.00)
Calcium: 9.6 mg/dL (ref 8.9–10.3)
Chloride: 100 mmol/L — ABNORMAL LOW (ref 101–111)
GFR calc non Af Amer: 17 mL/min — ABNORMAL LOW (ref >60)
GFR, EST AFRICAN AMERICAN: 19 mL/min — AB (ref >60)
Glucose, Bld: 97 mg/dL (ref 65–99)
Potassium: 3.5 mmol/L (ref 3.5–5.1)
Sodium: 141 mmol/L (ref 135–145)

## 2017-05-24 LAB — ANA W/REFLEX IF POSITIVE: ANA: NEGATIVE

## 2017-05-24 LAB — SEDIMENTATION RATE: Sed Rate: 45 mm/hr — ABNORMAL HIGH (ref 0–22)

## 2017-05-24 MED ORDER — ISOSORBIDE MONONITRATE ER 30 MG PO TB24
30.0000 mg | ORAL_TABLET | Freq: Every day | ORAL | Status: DC
  Administered 2017-05-24 – 2017-05-29 (×5): 30 mg via ORAL
  Filled 2017-05-24 (×5): qty 1

## 2017-05-24 NOTE — Progress Notes (Signed)
PROGRESS NOTE    [name]  QTM:[id number] DOB: [birth year] DOA: 05/22/2017 PCP: Hoyt Koch, MD  Outpatient Specialists:   Brief Narrative: Patient is a 72 year old African-American female, obese, with past medical history significant for chronic kidney disease stage IV, diastolic congestive heart failure, hypertension, chronic pericardial effusion, moderate to severe aortic incompetence, mild to moderate mitral regurgitation, and pulmonary hypertension.  According to the patient, she has been having worsening shortness of breath, dyspnea on exertion, orthopnea and leg edema.  The symptoms became so severe yesterday when she went for an outpatient echocardiogram and CT scan of the lung.  Apparently, the patient has been following up with the pulmonologist who is trying to rule out possible pulmonary disease.  The patient is also known to the cardiology team.  The patient was admitted with acute on chronic diastolic congestive heart failure, severe pericardial effusion, accelerated hypertension, and mildly elevated troponin that could easily be type II elevation.  With diuresis, the patient's symptoms are improving.  Cardiology and cardiothoracic teams have been consulted to assist with patient's management.  Cardiology team maintains possible TEE, right and left cardiac current very early next week if feasible (possibly on 05/26/2017).  Possible valve replacement surgery and draining of the pericardial fluid at the same time remains a possibility.  Echocardiogram is being pursued.  Apparently, last echo done was not complete.  At some point, when the patient is able to lay down flat, CT of the chest will be considered as well.  No headache, no neck pain, no chest pain, no fever or chills and no GI symptoms.  His shortness of breath is improving.  The patient's leg edema is also improving.  The patient reports overall, mild improvement.  05/24/17: Patient seen.  Patient looks a lot better  today.  Blood pressure is controlled.  Edema is less.  Shortness of breath has improved significantly.  Overall, patient is doing a lot better.  Cardiology input is appreciated.  Renal function is stable.  For possible right and left heart cart on Monday (05/26/2017).  Assessment & Plan:   Principal Problem:   Diastolic congestive heart failure (HCC) Active Problems:   Essential hypertension   CKD (chronic kidney disease) stage 4, GFR 15-29 ml/min (HCC)   Pericardial effusion   Nonrheumatic aortic valve insufficiency   Hypertensive emergency    Acute on chronic diastolic congestive heart failure: -Continue IV Lasix 80 mg twice daily. -Continue Aldactone. -Complete echocardiogram. -Optimize blood pressure control. -Continue nitro drip for now. -Wean off nitro drip. -Optimize blood pressure. -Continue to monitor I's and O's. -Overall, the congestive heart failure is improving.  Hypertensive urgency: -This could be related to significant dyspnea following CHF exacerbation. -Possibly, this could have also led to the patient's heart failure exacerbation. -Cautiously optimize blood pressure. -Hopefully, weight improvement in patient's dyspnea, the blood pressure will continue to improve. -Cautious use of nitro drip. -05/24/2017: Wean patient off of nitro.  Optimize blood pressure.    Chronic kidney disease stage IV, with possible acute kidney injury, possibly cardiorenal: -There has been mild interval improvement in patient's renal function with diuresis. -Optimize congestive heart failure management. -Avoid nephrotoxins. -Dose all medications considering renal impairment. -Nephro protective measures if cardiac catheterization is pursued. -The kidney disease is stable.  Valvular heart disease: -Patient has severe aortic incompetence. -Patient has moderate mitral regurgitation. -Cardiology and cardiothoracic input is appreciated. -For possible valve replacement  surgery.  Severe pericardial effusion: -Etiology remains uncertain. -As per cardiology, drainage can be done  at the time of volvulus surgery if possible. -Continue to monitor for tamponade. -Guarded prognosis.  Acute respiratory failure with hypoxia: -This likely multifactorial (likely secondary to combined effect of above). -Low threshold to consult pulmonary team to complete pulmonary workup. -05/24/2017: This has improved significantly.  GERD: Cont H2 blocker  Dry eyes: Cont eye drops  Allergies: Cont flonase  Chronic pain: Cont gabapentin Hold tramadol  Code Status:  Full.    DVT Prophylaxis: lovenox Family Communication:  Disposition Plan:  This will depend on hospital course.     Consultants:   Cardiology.    Cardiothoracic surgery.  Procedures:   Incomplete echo.  Antimicrobials:   None   Subjective: Shortness of breath has improved significantly. Patient feels a lot better, but not back to baseline.  No chest pain. No fever or chills.  Objective: Vitals:   05/24/17 0500 05/24/17 0804 05/24/17 1142 05/24/17 1510  BP: (!) 143/51 (!) 151/56 (!) 136/51 (!) 131/48  Pulse: 74 78 77 71  Resp: 20 (!) 24    Temp: 98.1 F (36.7 C) 98 F (36.7 C) (!) 97.4 F (36.3 C)   TempSrc:  Oral Oral   SpO2: 95% 99% 96% 96%  Weight: 87.6 kg (193 lb 3.2 oz)     Height:        Intake/Output Summary (Last 24 hours) at 05/24/2017 1541 Last data filed at 05/24/2017 1100 Gross per 24 hour  Intake 392.05 ml  Output 2050 ml  Net -1657.95 ml   Filed Weights   05/22/17 1057 05/23/17 0630 05/24/17 0500  Weight: 88.9 kg (196 lb) 88.5 kg (195 lb) 87.6 kg (193 lb 3.2 oz)    Examination:  General exam: Patient is much more comfortable today.  The patient is not in any distress.  Patient is awake, alert and oriented to time, place and person.  Patient is obese.   Respiratory system: Improved air entry, but still mildly decreased posteriorly.     Cardiovascular system: S1 & S2, systolic and diastolic murmur.  Leg edema.   Gastrointestinal system: Abdomen is obese, soft and nontender.  Organs are difficult to assess.  Central nervous system: Alert and oriented. No focal neurological deficits. Extremities: Bilateral leg edema, but improving.  Data Reviewed: I have personally reviewed following labs and imaging studies  CBC: Recent Labs  Lab 05/22/17 1102  WBC 7.4  HGB 10.4*  HCT 32.9*  MCV 96.8  PLT 062   Basic Metabolic Panel: Recent Labs  Lab 05/22/17 1102 05/23/17 0327 05/24/17 0213  NA 143 143 141  K 4.0 3.8 3.5  CL 103 101 100*  CO2 28 29 29   GLUCOSE 118* 99 97  BUN 32* 32* 34*  CREATININE 2.62* 2.53* 2.70*  CALCIUM 9.7 9.7 9.6   GFR: Estimated Creatinine Clearance: 21.3 mL/min (A) (by C-G formula based on SCr of 2.7 mg/dL (H)). Liver Function Tests: No results for input(s): AST, ALT, ALKPHOS, BILITOT, PROT, ALBUMIN in the last 168 hours. No results for input(s): LIPASE, AMYLASE in the last 168 hours. No results for input(s): AMMONIA in the last 168 hours. Coagulation Profile: No results for input(s): INR, PROTIME in the last 168 hours. Cardiac Enzymes: Recent Labs  Lab 05/22/17 1827 05/22/17 2123 05/23/17 0327 05/23/17 0941  TROPONINI 0.03* 0.28* 0.25* 0.21*   BNP (last 3 results) Recent Labs    11/27/16 1036  PROBNP 202.0*   HbA1C: No results for input(s): HGBA1C in the last 72 hours. CBG: No results for input(s): GLUCAP in the  last 168 hours. Lipid Profile: No results for input(s): CHOL, HDL, LDLCALC, TRIG, CHOLHDL, LDLDIRECT in the last 72 hours. Thyroid Function Tests: No results for input(s): TSH, T4TOTAL, FREET4, T3FREE, THYROIDAB in the last 72 hours. Anemia Panel: No results for input(s): VITAMINB12, FOLATE, FERRITIN, TIBC, IRON, RETICCTPCT in the last 72 hours. Urine analysis:    Component Value Date/Time   COLORURINE STRAW (A) 02/05/2017 2215   APPEARANCEUR CLEAR 02/05/2017  2215   LABSPEC 1.006 02/05/2017 2215   PHURINE 5.0 02/05/2017 2215   GLUCOSEU NEGATIVE 02/05/2017 2215   GLUCOSEU NEGATIVE 12/08/2015 1024   HGBUR NEGATIVE 02/05/2017 2215   BILIRUBINUR NEGATIVE 02/05/2017 2215   BILIRUBINUR neg 01/04/2016 1054   [place] 02/05/2017 2215   PROTEINUR NEGATIVE 02/05/2017 2215   UROBILINOGEN negative 01/04/2016 1054   UROBILINOGEN 0.2 12/08/2015 1024   NITRITE NEGATIVE 02/05/2017 2215   LEUKOCYTESUR NEGATIVE 02/05/2017 2215   Sepsis Labs: @LABRCNTIP (procalcitonin:4,lacticidven:4)  )No results found for this or any previous visit (from the past 240 hour(s)).       Radiology Studies: No results found.      Scheduled Meds: . aspirin EC  81 mg Oral Daily  . calcitRIOL  0.5 mcg Oral Daily  . enoxaparin (LOVENOX) injection  30 mg Subcutaneous Q24H  . fluticasone  2 spray Each Nare Daily  . furosemide  80 mg Intravenous Q12H  . gabapentin  300 mg Oral TID  . hydrALAZINE  25 mg Oral BID  . isosorbide mononitrate  30 mg Oral Daily  . labetalol  400 mg Oral BID  . losartan  100 mg Oral Daily  . sodium chloride flush  3 mL Intravenous Q12H  . spironolactone  50 mg Oral Daily   Continuous Infusions: . sodium chloride Stopped (05/24/17 1146)     LOS: 2 days    Time spent: 40 minutes.    Dana Allan, MD  [facility] Pager #: [phone] 7PM-7AM contact night coverage as above

## 2017-05-24 NOTE — Progress Notes (Signed)
DAILY PROGRESS NOTE   Patient Name: [name] Date of Encounter: 05/24/2017  Chief Complaint   Breathing significantly better  Patient Profile   [name] is a 72 y.o. female with a hx of known chronic pericardial effusion since 2018 (however progressively becoming larger), diastolic heart failure (EF 60-65% with moderate aortic insufficiency and mitral regurgitation with grade 1 DD (per echo 04/20/17), LBBB, CAD, chronic bilateral LE edema, HTN, and CKD stage III who is being seen today for the evaluation of large pericardial effusion and severe SOB at the request of Dr. Aggie Moats  Subjective   Diuresed 2L negative overnight. Weight is down 2 lbs. Troponin with mild increase to 0.28, now declining. BNP elevated at 800. Creatinine stable around 2.5-2.7. Plan for possible R/LHC on Monday with TEE to follow. May also need cardiac CT to evaluate for sinus of valsalva aneurysm.   Objective   Vitals:   05/24/17 0300 05/24/17 0400 05/24/17 0500 05/24/17 0804  BP: (!) 128/49 (!) 150/48 (!) 143/51 (!) 151/56  Pulse:  69 74 78  Resp:   20 (!) 24  Temp:   98.1 F (36.7 C) 98 F (36.7 C)  TempSrc:    Oral  SpO2:  98% 95% 99%  Weight:   193 lb 3.2 oz (87.6 kg)   Height:        Intake/Output Summary (Last 24 hours) at 05/24/2017 1123 Last data filed at 05/24/2017 1020 Gross per 24 hour  Intake 456.95 ml  Output 2150 ml  Net -1693.05 ml   Filed Weights   05/22/17 1057 05/23/17 0630 05/24/17 0500  Weight: 196 lb (88.9 kg) 195 lb (88.5 kg) 193 lb 3.2 oz (87.6 kg)    Physical Exam   General appearance: alert and no distress Neck: JVD - 3 cm above sternal notch, no carotid bruit and thyroid not enlarged, symmetric, no tenderness/mass/nodules Lungs: diminished breath sounds bibasilar Heart: regular rate and rhythm Abdomen: soft, non-tender; bowel sounds normal; no masses,  no organomegaly Extremities: extremities normal, atraumatic, no cyanosis or edema Pulses: 2+ and  symmetric Skin: Skin color, texture, turgor normal. No rashes or lesions Neurologic: Grossly normal Psych: Pleasant  Inpatient Medications    Scheduled Meds: . aspirin EC  81 mg Oral Daily  . calcitRIOL  0.5 mcg Oral Daily  . enoxaparin (LOVENOX) injection  30 mg Subcutaneous Q24H  . fluticasone  2 spray Each Nare Daily  . furosemide  80 mg Intravenous Q12H  . gabapentin  300 mg Oral TID  . hydrALAZINE  25 mg Oral BID  . labetalol  400 mg Oral BID  . losartan  100 mg Oral Daily  . sodium chloride flush  3 mL Intravenous Q12H  . spironolactone  50 mg Oral Daily    Continuous Infusions: . sodium chloride    . nitroGLYCERIN 15 mcg/min (05/23/17 2302)    PRN Meds: sodium chloride, acetaminophen, albuterol, famotidine, hydrALAZINE, hydrocortisone, ondansetron (ZOFRAN) IV, polyvinyl alcohol, sodium chloride flush   Labs   Results for orders placed or performed during the hospital encounter of 05/22/17 (from the past 48 hour(s))  I-stat troponin, ED     Status: Abnormal   Collection Time: 05/22/17  1:52 PM  Result Value Ref Range   Troponin i, poc 0.12 (HH) 0.00 - 0.08 ng/mL   Comment NOTIFIED PHYSICIAN    Comment 3            Comment: Due to the release kinetics of cTnI, a negative result within the first  hours of the onset of symptoms does not rule out myocardial infarction with certainty. If myocardial infarction is still suspected, repeat the test at appropriate intervals.   Troponin I     Status: Abnormal   Collection Time: 05/22/17  6:27 PM  Result Value Ref Range   Troponin I 0.03 (HH) <0.03 ng/mL    Comment: CRITICAL RESULT CALLED TO, READ BACK BY AND VERIFIED WITH: Assunta Found 1913 05/22/2017 WBOND Performed at [facility], [place] [address] [place], [place] [address]   Troponin I (q 6hr x 3)     Status: Abnormal   Collection Time: 05/22/17  9:23 PM  Result Value Ref Range   Troponin I 0.28 (HH) <0.03 ng/mL    Comment: CRITICAL RESULT CALLED TO, READ  BACK BY AND VERIFIED WITH: SHERRILL [facility] 05/22/17 2233 WAYK Performed at [facility], [place] [address], [place], [place] [address]   Troponin I (q 6hr x 3)     Status: Abnormal   Collection Time: 05/23/17  3:27 AM  Result Value Ref Range   Troponin I 0.25 (HH) <0.03 ng/mL    Comment: CRITICAL VALUE NOTED.  VALUE IS CONSISTENT WITH PREVIOUSLY REPORTED AND CALLED VALUE. Performed at [facility], [place] [address] [place], [place] [address]   Basic metabolic panel     Status: Abnormal   Collection Time: 05/23/17  3:27 AM  Result Value Ref Range   Sodium 143 135 - 145 mmol/L   Potassium 3.8 3.5 - 5.1 mmol/L   Chloride 101 101 - 111 mmol/L   CO2 29 22 - 32 mmol/L   Glucose, Bld 99 65 - 99 mg/dL   BUN 32 (H) 6 - 20 mg/dL   Creatinine, Ser 2.53 (H) 0.44 - 1.00 mg/dL   Calcium 9.7 8.9 - 10.3 mg/dL   GFR calc non Af Amer 18 (L) >60 mL/min   GFR calc Af Amer 21 (L) >60 mL/min    Comment: (NOTE) The eGFR has been calculated using the CKD EPI equation. This calculation has not been validated in all clinical situations. eGFR's persistently <60 mL/min signify possible Chronic Kidney Disease.    Anion gap 13 5 - 15    Comment: Performed at [facility] [address], [place], [place] [address]  Troponin I (q 6hr x 3)     Status: Abnormal   Collection Time: 05/23/17  9:41 AM  Result Value Ref Range   Troponin I 0.21 (HH) <0.03 ng/mL    Comment: CRITICAL VALUE NOTED.  VALUE IS CONSISTENT WITH PREVIOUSLY REPORTED AND CALLED VALUE. Performed at [facility], [place] [address], [place], [place] [address]   Basic metabolic panel     Status: Abnormal   Collection Time: 05/24/17  2:13 AM  Result Value Ref Range   Sodium 141 135 - 145 mmol/L   Potassium 3.5 3.5 - 5.1 mmol/L   Chloride 100 (L) 101 - 111 mmol/L   CO2 29 22 - 32 mmol/L   Glucose, Bld 97 65 - 99 mg/dL   BUN 34 (H) 6 - 20 mg/dL   Creatinine, Ser 2.70 (H) 0.44 - 1.00 mg/dL   Calcium 9.6 8.9 - 10.3 mg/dL   GFR  calc non Af Amer 17 (L) >60 mL/min   GFR calc Af Amer 19 (L) >60 mL/min    Comment: (NOTE) The eGFR has been calculated using the CKD EPI equation. This calculation has not been validated in all clinical situations. eGFR's  persistently <60 mL/min signify possible Chronic Kidney Disease.    Anion gap 12 5 - 15    Comment: Performed at [facility] [address], [place], [place] [address]  Sedimentation rate     Status: Abnormal   Collection Time: 05/24/17  2:13 AM  Result Value Ref Range   Sed Rate 45 (H) 0 - 22 mm/hr    Comment: Performed at [facility] [address], [place], [place] [address]    ECG   N/A  Telemetry   Sinus rhythm - Personally Reviewed  Radiology    Dg Chest 2 View  Result Date: 05/22/2017 CLINICAL DATA:  Shortness of breath. EXAM: CHEST  2 VIEW COMPARISON:  Radiographs of April 19, 2017. FINDINGS: Stable severe cardiomegaly is noted. Stable central pulmonary vascular congestion is noted. Interstitial densities are noted in both lung bases concerning for pulmonary edema. No pneumothorax or significant pleural effusion is noted. Bony thorax is unremarkable. IMPRESSION: Stable cardiomegaly with central pulmonary vascular congestion. Probable bilateral pulmonary edema is noted. Electronically Signed   By: Marijo Conception, M.D.   On: 05/22/2017 11:32    Cardiac Studies   LV EF: 60% -   65%  ------------------------------------------------------------------- Indications:      Pericardial effusion 423.9.  ------------------------------------------------------------------- History:   PMH:   Murmur.  Congestive heart failure.  Aortic valve disease.  Pericardial disease.  Risk factors:  Hypertension.  ------------------------------------------------------------------- Study Conclusions  - Left ventricle: The cavity size was normal. There was severe   concentric hypertrophy. Systolic function was normal. The   estimated ejection  fraction was in the range of 60% to 65%. Wall   motion was normal; there were no regional wall motion   abnormalities. Doppler parameters are consistent with abnormal   left ventricular relaxation (grade 1 diastolic dysfunction).   Doppler parameters are consistent with elevated mean left atrial   filling pressure. - Ventricular septum: Septal motion showed paradox. These changes   are consistent with a left bundle branch block. - Aortic valve: Transvalvular velocity was increased more than   expected, due to high stroke volume. There was mild stenosis.   There was severe regurgitation directed centrally in the LVOT.   Severe regurgitation is suggested by an aortic regurgitation PHT   <= 250 ms and a vena contracta >= 6 mm. Valve area (VTI): 2.05   cm^2. Valve area (Vmax): 1.82 cm^2. Valve area (Vmean): 1.97   cm^2. - Aortic root: Suspicious for aneurysm of the right coronary sinus   of Valsalva or aneurysm of the proximal right coronary artery. - Mitral valve: There was mild to moderate regurgitation directed   eccentrically and posteriorly. - Left atrium: The atrium was mildly dilated. - Pulmonary arteries: Systolic pressure was mildly increased. PA   peak pressure: 43 mm Hg (S). - Pericardium, extracardiac: A moderate to large, free-flowing   pericardial effusion was identified circumferential to the heart.   The fluid had no internal echoes.  Impressions:  - There is equivocal evidence for pericardial tamponade. While   there is diastolic collapse of the right atrium and there is   plethora of the inferior vena cava, there is no right ventricular   collapse and there is no enhanced respiratory flow variation   across the AV valves. Overall, the dominant abnormality appears   to be decompensated diastolic heart failure due to hypertensive   hypertrophic cardiomyopathy and severe aortic insufficiency.   The mechanism of aortic insufficiency is not  immediately obvious.   There  is no evidence fo ascending aortic aneurysm or dissection,   but there is suspicion for aneurysm of the right sinus of   Valsalva (alternately this may be an aneurysm of the proximal   right coronary artery).  Assessment   Principal Problem:   Diastolic congestive heart failure (HCC) Active Problems:   Essential hypertension   CKD (chronic kidney disease) stage 4, GFR 15-29 ml/min (HCC)   Pericardial effusion   Nonrheumatic aortic valve insufficiency   Hypertensive emergency   Plan   1. Marked improvement in breathing - continue diuresis. Creatinine stable. Wean off nitro gtts today. BP improved. Switch to imdur 30 mg daily. Agree with plans for [facility] on Monday - likely TEE to follow.   Time Spent Directly with Patient:  I have spent a total of 25 minutes with the patient reviewing hospital notes, telemetry, EKGs, labs and examining the patient as well as establishing an assessment and plan that was discussed personally with the patient. > 50% of time was spent in direct patient care.  Length of Stay:  LOS: 2 days   Pixie Casino, MD, [facility], [place] Director of the [facility]  [facility] Diplomate of the American Board of Clinical Lipidology Attending Cardiologist  Direct Dial: [phone]  Fax: [phone]  Website:  [email or web address] 05/24/2017, 11:23 AM

## 2017-05-25 DIAGNOSIS — I5031 Acute diastolic (congestive) heart failure: Secondary | ICD-10-CM

## 2017-05-25 LAB — BASIC METABOLIC PANEL
Anion gap: 12 (ref 5–15)
BUN: 36 mg/dL — AB (ref 6–20)
CHLORIDE: 100 mmol/L — AB (ref 101–111)
CO2: 31 mmol/L (ref 22–32)
Calcium: 9.7 mg/dL (ref 8.9–10.3)
Creatinine, Ser: 2.95 mg/dL — ABNORMAL HIGH (ref 0.44–1.00)
GFR calc Af Amer: 17 mL/min — ABNORMAL LOW (ref >60)
GFR calc non Af Amer: 15 mL/min — ABNORMAL LOW (ref >60)
Glucose, Bld: 106 mg/dL — ABNORMAL HIGH (ref 65–99)
Potassium: 3.9 mmol/L (ref 3.5–5.1)
SODIUM: 143 mmol/L (ref 135–145)

## 2017-05-25 MED ORDER — LABETALOL HCL 200 MG PO TABS
200.0000 mg | ORAL_TABLET | Freq: Once | ORAL | Status: AC
  Administered 2017-05-25: 200 mg via ORAL
  Filled 2017-05-25: qty 1

## 2017-05-25 MED ORDER — HYDRALAZINE HCL 50 MG PO TABS: 50.0000 mg | ORAL_TABLET | Freq: Three times a day (TID) | ORAL | Status: DC

## 2017-05-25 MED ORDER — TORSEMIDE 20 MG PO TABS: 40.0000 mg | ORAL_TABLET | Freq: Every day | ORAL | Status: DC

## 2017-05-25 MED ORDER — LABETALOL HCL 300 MG PO TABS
600.0000 mg | ORAL_TABLET | Freq: Two times a day (BID) | ORAL | Status: DC
  Administered 2017-05-25 – 2017-05-29 (×7): 600 mg via ORAL
  Filled 2017-05-25 (×7): qty 2

## 2017-05-25 MED ORDER — HYDRALAZINE HCL 50 MG PO TABS
75.0000 mg | ORAL_TABLET | Freq: Three times a day (TID) | ORAL | Status: DC
  Administered 2017-05-25 – 2017-05-30 (×11): 75 mg via ORAL
  Filled 2017-05-25 (×12): qty 1

## 2017-05-25 NOTE — Progress Notes (Signed)
Erroneous entry

## 2017-05-25 NOTE — Progress Notes (Signed)
Progress Note  Patient Name: [name] Date of Encounter: 05/25/2017  Primary Cardiologist: Minus Breeding, MD   Subjective   Breathing better than admit  Inpatient Medications    Scheduled Meds: . aspirin EC  81 mg Oral Daily  . calcitRIOL  0.5 mcg Oral Daily  . enoxaparin (LOVENOX) injection  30 mg Subcutaneous Q24H  . fluticasone  2 spray Each Nare Daily  . gabapentin  300 mg Oral TID  . hydrALAZINE  50 mg Oral Q8H  . isosorbide mononitrate  30 mg Oral Daily  . labetalol  600 mg Oral BID  . sodium chloride flush  3 mL Intravenous Q12H  . spironolactone  50 mg Oral Daily  . [START ON 05/26/2017] torsemide  40 mg Oral Daily   Continuous Infusions: . sodium chloride Stopped (05/24/17 1146)   PRN Meds: sodium chloride, acetaminophen, albuterol, famotidine, hydrALAZINE, hydrocortisone, ondansetron (ZOFRAN) IV, polyvinyl alcohol, sodium chloride flush   Vital Signs    Vitals:   05/24/17 2014 05/25/17 0534 05/25/17 0755 05/25/17 0825  BP: (!) 146/50 (!) 149/46 (!) 158/55   Pulse: 80 77    Resp: 19     Temp: 98.1 F (36.7 C) 97.7 F (36.5 C)    TempSrc: Oral Oral    SpO2: 95% 97%  94%  Weight:  192 lb 12.8 oz (87.5 kg)    Height:       Wt down 196 to 192    Intake/Output Summary (Last 24 hours) at 05/25/2017 1107 Last data filed at 05/25/2017 0600 Gross per 24 hour  Intake -  Output 1550 ml  Net -1550 ml   Net neg 5.3 L   Filed Weights   05/23/17 0630 05/24/17 0500 05/25/17 0534  Weight: 195 lb (88.5 kg) 193 lb 3.2 oz (87.6 kg) 192 lb 12.8 oz (87.5 kg)    Telemetry    SR/ ST   - Personally Reviewed  ECG    Not done today - Personally Reviewed  Physical Exam   GEN: No acute distress.   Neck: JVP increased Cardiac: RRR, Gr II/VI sysotlic murmur at base  Gr I/VI diastolic murmur LSB (short)  No, rubs, or gallops.  Respiratory: Clear to auscultation bilaterally. GI: Soft, nontender, non-distended  MS: Tr edema; No deformity. Neuro:  Nonfocal    Psych: Normal affect   Labs    Chemistry Recent Labs  Lab 05/23/17 0327 05/24/17 0213 05/25/17 0253  NA 143 141 143  K 3.8 3.5 3.9  CL 101 100* 100*  CO2 29 29 31   GLUCOSE 99 97 106*  BUN 32* 34* 36*  CREATININE 2.53* 2.70* 2.95*  CALCIUM 9.7 9.6 9.7  GFRNONAA 18* 17* 15*  GFRAA 21* 19* 17*  ANIONGAP 13 12 12      Hematology Recent Labs  Lab 05/22/17 1102  WBC 7.4  RBC 3.40*  HGB 10.4*  HCT 32.9*  MCV 96.8  MCH 30.6  MCHC 31.6  RDW 17.1*  PLT 200    Cardiac Enzymes Recent Labs  Lab 05/22/17 1827 05/22/17 2123 05/23/17 0327 05/23/17 0941  TROPONINI 0.03* 0.28* 0.25* 0.21*    Recent Labs  Lab 05/22/17 1352  TROPIPOC 0.12*     BNP Recent Labs  Lab 05/22/17 1102  BNP 800.0*     DDimer No results for input(s): DDIMER in the last 168 hours.   Radiology    No results found.  Cardiac Studies     Patient Profile     72 y.o. female  Assessment & Plan    1  Acute on chornic diastolic CHF  Volume status improved from admit  Still with some volume on exam  Slowing diureisis due to bump in Cr  Will reassess in AM    2  Pericardial effusion  Mod to learge circumferential  Pt has been seen by CV surgery    3  Aortic valve disease  Echo on 2/15 sug mild AS and severe AI  PHT of AI is short   Susp for R sinus of valsalva aneurysm.   Plan for poss R/L heart cath when pt's renal function improves  Possible TEE  4  HTN  BP remains labile  Will increase hydralazine    5  CKD   Stage IV Pt followes with Dr Jimmy Footman as an outpt  I would recomm that they be contacted, esp if plans for possible cath  6  Trop  Trilval elevtion  I am not convinced of active ischemia    For questions or updates, please contact [facility] Please consult [email or web address] for contact info under Cardiology/STEMI.      Signed, Dorris Carnes, MD  05/25/2017, 11:07 AM

## 2017-05-25 NOTE — Progress Notes (Signed)
PROGRESS NOTE    Jackelynn Hosie  LKT:[id number] DOB: [birth year] DOA: 05/22/2017 PCP: Hoyt Koch, MD  Outpatient Specialists:   Brief Narrative: Patient is a 72 year old African-American female, obese, with past medical history significant for chronic kidney disease stage IV, diastolic congestive heart failure, hypertension, chronic pericardial effusion, moderate to severe aortic incompetence, mild to moderate mitral regurgitation, and pulmonary hypertension.  According to the patient, she has been having worsening shortness of breath, dyspnea on exertion, orthopnea and leg edema.  The symptoms became so severe yesterday when she went for an outpatient echocardiogram and CT scan of the lung.  Apparently, the patient has been following up with the pulmonologist who is trying to rule out possible pulmonary disease.  The patient is also known to the cardiology team.  The patient was admitted with acute on chronic diastolic congestive heart failure, severe pericardial effusion, accelerated hypertension, and mildly elevated troponin that could easily be type II elevation.  With diuresis, the patient's symptoms are improving.  Cardiology and cardiothoracic teams have been consulted to assist with patient's management.  Cardiology team maintains possible TEE, right and left cardiac current very early next week if feasible (possibly on 05/26/2017).  Possible valve replacement surgery and draining of the pericardial fluid at the same time remains a possibility.  Echocardiogram is being pursued.  Apparently, last echo done was not complete.  At some point, when the patient is able to lay down flat, CT of the chest will be considered as well.  No headache, no neck pain, no chest pain, no fever or chills and no GI symptoms.  His shortness of breath is improving.  The patient's leg edema is also improving.  The patient reports overall, mild improvement.  05/24/17: Patient seen.  Patient looks a lot better  today.  Blood pressure is controlled.  Edema is less.  Shortness of breath has improved significantly.  Overall, patient is doing a lot better.  Cardiology input is appreciated.  Renal function is stable.  For possible right and left heart cart on Monday (05/26/2017).  05/25/2017: Patient seen alongside patient's husband.  Patient looks a lot better today.  His shortness of breath has improved significantly.  Leg edema has also improved significantly.  Will discontinue IV Lasix.  Will start patient on oral torsemide 40 mg orally once daily (patient's home dose) from tomorrow.  Continue to monitor renal function closely.  Will discontinue losartan.  Will increase the dose of hydralazine and labetalol.  Apparently, patient has creatinine bumped from 2.7-2.95.  Possible cardiac cath in the morning.  Patient is at risk for contrast-induced nephropathy.  Continue to optimize patient prior to cardiac catheterization.  Monitor renal function closely after cardiac cath.  Cardiology and cardiothoracic input is highly appreciated.  Assessment & Plan:   Principal Problem:   Diastolic congestive heart failure (HCC) Active Problems:   Essential hypertension   CKD (chronic kidney disease) stage 4, GFR 15-29 ml/min (HCC)   Pericardial effusion   Nonrheumatic aortic valve insufficiency   Hypertensive emergency    Acute on chronic diastolic congestive heart failure: -Continue IV Lasix 80 mg twice daily. -Continue Aldactone. -Complete echocardiogram. -Optimize blood pressure control. -Continue nitro drip for now. -Wean off nitro drip. -Optimize blood pressure. -Continue to monitor I's and O's. -Overall, the congestive heart failure is improving. -05/25/2017: Discontinue IV Lasix.  Start torsemide in the morning.  Continue to monitor renal function.  Hypertensive urgency: -This could be related to significant dyspnea following CHF exacerbation. -Possibly, this  could have also led to the patient's heart  failure exacerbation. -Cautiously optimize blood pressure. -Hopefully, weight improvement in patient's dyspnea, the blood pressure will continue to improve. -Cautious use of nitro drip. -05/24/2017: Wean patient off of nitro.  Optimize blood pressure. -05/25/2017: DC losartan.  Increase the dose of hydralazine to 50 mg p.o. 3 times daily.  Increase the dose of labetalol to 600 mg p.o. twice daily.  Continue to monitor patient's blood pressure.  Chronic kidney disease stage IV, with possible acute kidney injury, possibly cardiorenal: -There has been mild interval improvement in patient's renal function with diuresis. -Optimize congestive heart failure management. -Avoid nephrotoxins. -Dose all medications considering renal impairment. -Nephro protective measures if cardiac catheterization is pursued. -The kidney disease is stable. -05/25/2017: Serum creatinine has gone from 2.7-2.95.  Will discontinue IV Lasix.  Valvular heart disease: -Patient has severe aortic incompetence. -Patient has moderate mitral regurgitation. -Cardiology and cardiothoracic input is appreciated. -For possible valve replacement surgery.  Severe pericardial effusion: -Etiology remains uncertain. -As per cardiology, drainage can be done at the time of volvulus surgery if possible. -Continue to monitor for tamponade. -Guarded prognosis.  Acute respiratory failure with hypoxia: -This likely multifactorial (likely secondary to combined effect of above). -Low threshold to consult pulmonary team to complete pulmonary workup. -05/24/2017: This has resolved significantly.  Patient was without oxygen when I saw her.  GERD: Cont H2 blocker  Dry eyes: Cont eye drops  Allergies: Cont flonase  Chronic pain: Cont gabapentin Hold tramadol  Code Status:  Full.    DVT Prophylaxis: lovenox Family Communication:  Disposition Plan:  This will depend on hospital course.     Consultants:   Cardiology.     Cardiothoracic surgery.  Procedures:   Incomplete echo.  Antimicrobials:   None   Subjective: Shortness of breath has resolved significantly. Patient feels a lot better. No chest pain. No fever or chills.  Objective: Vitals:   05/25/17 0825 05/25/17 1250 05/25/17 1255 05/25/17 1257  BP:  (!) 132/43    Pulse:    71  Resp:      Temp:      TempSrc:      SpO2: 94%  96%   Weight:      Height:        Intake/Output Summary (Last 24 hours) at 05/25/2017 1600 Last data filed at 05/25/2017 1300 Gross per 24 hour  Intake -  Output 2150 ml  Net -2150 ml   Filed Weights   05/23/17 0630 05/24/17 0500 05/25/17 0534  Weight: 88.5 kg (195 lb) 87.6 kg (193 lb 3.2 oz) 87.5 kg (192 lb 12.8 oz)    Examination:  General exam: Patient is much more comfortable today.  The patient is not in any distress.  Patient is awake, alert and oriented to time, place and person.  Patient is obese.   Respiratory system: Improved air entry. Cardiovascular system: S1 & S2, systolic and diastolic murmur.  Leg edema.   Gastrointestinal system: Abdomen is obese, soft and nontender.  Organs are difficult to assess.  Central nervous system: Alert and oriented. No focal neurological deficits. Extremities: Minimal leg edema.  Data Reviewed: I have personally reviewed following labs and imaging studies  CBC: Recent Labs  Lab 05/22/17 1102  WBC 7.4  HGB 10.4*  HCT 32.9*  MCV 96.8  PLT 086   Basic Metabolic Panel: Recent Labs  Lab 05/22/17 1102 05/23/17 0327 05/24/17 0213 05/25/17 0253  NA 143 143 141 143  K 4.0 3.8 3.5  3.9  CL 103 101 100* 100*  CO2 28 29 29 31   GLUCOSE 118* 99 97 106*  BUN 32* 32* 34* 36*  CREATININE 2.62* 2.53* 2.70* 2.95*  CALCIUM 9.7 9.7 9.6 9.7   GFR: Estimated Creatinine Clearance: 19.5 mL/min (A) (by C-G formula based on SCr of 2.95 mg/dL (H)). Liver Function Tests: No results for input(s): AST, ALT, ALKPHOS, BILITOT, PROT, ALBUMIN in the last 168  hours. No results for input(s): LIPASE, AMYLASE in the last 168 hours. No results for input(s): AMMONIA in the last 168 hours. Coagulation Profile: No results for input(s): INR, PROTIME in the last 168 hours. Cardiac Enzymes: Recent Labs  Lab 05/22/17 1827 05/22/17 2123 05/23/17 0327 05/23/17 0941  TROPONINI 0.03* 0.28* 0.25* 0.21*   BNP (last 3 results) Recent Labs    11/27/16 1036  PROBNP 202.0*   HbA1C: No results for input(s): HGBA1C in the last 72 hours. CBG: No results for input(s): GLUCAP in the last 168 hours. Lipid Profile: No results for input(s): CHOL, HDL, LDLCALC, TRIG, CHOLHDL, LDLDIRECT in the last 72 hours. Thyroid Function Tests: No results for input(s): TSH, T4TOTAL, FREET4, T3FREE, THYROIDAB in the last 72 hours. Anemia Panel: No results for input(s): VITAMINB12, FOLATE, FERRITIN, TIBC, IRON, RETICCTPCT in the last 72 hours. Urine analysis:    Component Value Date/Time   COLORURINE STRAW (A) 02/05/2017 2215   APPEARANCEUR CLEAR 02/05/2017 2215   LABSPEC 1.006 02/05/2017 2215   PHURINE 5.0 02/05/2017 2215   GLUCOSEU NEGATIVE 02/05/2017 2215   GLUCOSEU NEGATIVE 12/08/2015 1024   HGBUR NEGATIVE 02/05/2017 2215   BILIRUBINUR NEGATIVE 02/05/2017 2215   BILIRUBINUR neg 01/04/2016 1054   [place] 02/05/2017 2215   PROTEINUR NEGATIVE 02/05/2017 2215   UROBILINOGEN negative 01/04/2016 1054   UROBILINOGEN 0.2 12/08/2015 1024   NITRITE NEGATIVE 02/05/2017 2215   LEUKOCYTESUR NEGATIVE 02/05/2017 2215   Sepsis Labs: @LABRCNTIP (procalcitonin:4,lacticidven:4)  )No results found for this or any previous visit (from the past 240 hour(s)).       Radiology Studies: No results found.      Scheduled Meds: . aspirin EC  81 mg Oral Daily  . calcitRIOL  0.5 mcg Oral Daily  . enoxaparin (LOVENOX) injection  30 mg Subcutaneous Q24H  . fluticasone  2 spray Each Nare Daily  . gabapentin  300 mg Oral TID  . hydrALAZINE  75 mg Oral Q8H  .  isosorbide mononitrate  30 mg Oral Daily  . labetalol  600 mg Oral BID  . sodium chloride flush  3 mL Intravenous Q12H  . spironolactone  50 mg Oral Daily   Continuous Infusions: . sodium chloride Stopped (05/24/17 1146)     LOS: 3 days    Time spent: 35 minutes.    Dana Allan, MD  [facility] Pager #: [phone] 7PM-7AM contact night coverage as above

## 2017-05-26 LAB — PROTEIN ELECTROPHORESIS, SERUM
A/G RATIO SPE: 0.9 (ref 0.7–1.7)
Albumin ELP: 3.1 g/dL (ref 2.9–4.4)
Alpha-1-Globulin: 0.3 g/dL (ref 0.0–0.4)
Alpha-2-Globulin: 0.9 g/dL (ref 0.4–1.0)
Beta Globulin: 0.9 g/dL (ref 0.7–1.3)
GLOBULIN, TOTAL: 3.5 g/dL (ref 2.2–3.9)
Gamma Globulin: 1.5 g/dL (ref 0.4–1.8)
TOTAL PROTEIN ELP: 6.6 g/dL (ref 6.0–8.5)

## 2017-05-26 LAB — BASIC METABOLIC PANEL
Anion gap: 12 (ref 5–15)
BUN: 38 mg/dL — AB (ref 6–20)
CHLORIDE: 100 mmol/L — AB (ref 101–111)
CO2: 30 mmol/L (ref 22–32)
CREATININE: 3.08 mg/dL — AB (ref 0.44–1.00)
Calcium: 9.6 mg/dL (ref 8.9–10.3)
GFR calc Af Amer: 16 mL/min — ABNORMAL LOW (ref >60)
GFR calc non Af Amer: 14 mL/min — ABNORMAL LOW (ref >60)
Glucose, Bld: 98 mg/dL (ref 65–99)
POTASSIUM: 3.5 mmol/L (ref 3.5–5.1)
Sodium: 142 mmol/L (ref 135–145)

## 2017-05-26 LAB — RENAL FUNCTION PANEL
Albumin: 2.8 g/dL — ABNORMAL LOW (ref 3.5–5.0)
Anion gap: 12 (ref 5–15)
BUN: 38 mg/dL — ABNORMAL HIGH (ref 6–20)
CO2: 30 mmol/L (ref 22–32)
Calcium: 9.7 mg/dL (ref 8.9–10.3)
Chloride: 100 mmol/L — ABNORMAL LOW (ref 101–111)
Creatinine, Ser: 3.06 mg/dL — ABNORMAL HIGH (ref 0.44–1.00)
GFR calc Af Amer: 17 mL/min — ABNORMAL LOW (ref >60)
GFR calc non Af Amer: 14 mL/min — ABNORMAL LOW (ref >60)
Glucose, Bld: 99 mg/dL (ref 65–99)
Phosphorus: 3.6 mg/dL (ref 2.5–4.6)
Potassium: 3.5 mmol/L (ref 3.5–5.1)
Sodium: 142 mmol/L (ref 135–145)

## 2017-05-26 LAB — IMMUNOFIXATION ELECTROPHORESIS
IGA: 254 mg/dL (ref 64–422)
IGG (IMMUNOGLOBIN G), SERUM: 1465 mg/dL (ref 700–1600)
IgM (Immunoglobulin M), Srm: 61 mg/dL (ref 26–217)
TOTAL PROTEIN ELP: 6.6 g/dL (ref 6.0–8.5)

## 2017-05-26 MED ORDER — POTASSIUM CHLORIDE CRYS ER 20 MEQ PO TBCR
20.0000 meq | EXTENDED_RELEASE_TABLET | Freq: Once | ORAL | Status: AC
  Administered 2017-05-26: 20 meq via ORAL
  Filled 2017-05-26: qty 1

## 2017-05-26 NOTE — Care Management Important Message (Signed)
Important Message  Patient Details  Name: [name] MRN: [id number] Date of Birth: 10-Oct-1945   Medicare Important Message Given:  Yes    Orbie Pyo 05/26/2017, 12:28 PM

## 2017-05-26 NOTE — Consult Note (Addendum)
   [facility] CM Inpatient Consult   05/26/2017  [place] Spainhower 12-03-1945 [id number]    [facility] Care Management follow up.   Spoke with inpatient RNCM before engaging patient for potential [facility] Care Management services. Advised that today is a better day to speak with Mrs. Grunert about [place] Management.   Went to bedside. Spoke with Mrs. Dunphy about [place] Management program. She is agreeable and written consent obtained. [facility] Care Management folder provided.   Mrs. Wickes lives with husband. Confirmed Primary Care MD is Dr. Sharlet Salina. [place] at [place] office performs their own transition of care calls. Will alert Lucy at the PCP office to make aware [place] Management will follow.  Mrs. Bossman denies having issues or concerns with transportation or medication.  Denies weighing daily but states she plans on weighing daily when she returns home.  Mrs. Rocks endorses the best contact number for her is [phone].  Discussed referral to [facility] for home visits. Mrs. Illingworth states she does not think she needs home visits. States she would rather get telephone calls instead. Discussed referral for [facility] Telephonic RNCM for CHF disease and symptom management. Explained [facility] Care Management will not interfere or replace services provided by home health.   Spoke with Lorre Nick at Dr. Nathanial Millman office to make aware [place] Management will follow post discharge as well due to high risk for readmission.   Spoke with inpatient RNCM to make aware [place] Management will follow.   Referral made to Telephonic [facility] for disease management for CHF.  Past medical history significant for chronic kidney disease stage IV, diastolic congestive heart failure, hypertension, chronic pericardial effusion, moderate to severe aortic incompetence, mild to moderate mitral regurgitation, and pulmonary hypertension. Medium risk score of 20% of unplanned readmission noted.       Marthenia Rolling, MSN-Ed,  RN,BSN [facility] Liaison [phone]

## 2017-05-26 NOTE — Progress Notes (Addendum)
Progress Note  Patient Name: [name] Date of Encounter: 05/26/2017  Primary Cardiologist: Minus Breeding, MD   Subjective   Pt is feeling better today except has nausea after taking her am meds. She denies chest discomfort and breathing is better. She has only been up to the [facility] and had no dyspnea. She feels that she can lay almost flat. Laying completely flat tends to cause dizziness in the past.   Inpatient Medications    Scheduled Meds: . aspirin EC  81 mg Oral Daily  . calcitRIOL  0.5 mcg Oral Daily  . enoxaparin (LOVENOX) injection  30 mg Subcutaneous Q24H  . fluticasone  2 spray Each Nare Daily  . gabapentin  300 mg Oral TID  . hydrALAZINE  75 mg Oral Q8H  . isosorbide mononitrate  30 mg Oral Daily  . labetalol  600 mg Oral BID  . sodium chloride flush  3 mL Intravenous Q12H  . spironolactone  50 mg Oral Daily   Continuous Infusions: . sodium chloride Stopped (05/24/17 1146)   PRN Meds: sodium chloride, acetaminophen, albuterol, famotidine, hydrALAZINE, hydrocortisone, ondansetron (ZOFRAN) IV, polyvinyl alcohol, sodium chloride flush   Vital Signs    Vitals:   05/26/17 0048 05/26/17 0351 05/26/17 0800 05/26/17 0957  BP: (!) 111/45 (!) 128/39 (!) 122/49   Pulse: 74 71  84  Resp: 16 18    Temp: 98.4 F (36.9 C) 98.5 F (36.9 C)    TempSrc: Oral Oral    SpO2: 98% 97%    Weight:  192 lb 9.6 oz (87.4 kg)    Height:        Intake/Output Summary (Last 24 hours) at 05/26/2017 1027 Last data filed at 05/25/2017 1900 Gross per 24 hour  Intake -  Output 501 ml  Net -501 ml   Filed Weights   05/24/17 0500 05/25/17 0534 05/26/17 0351  Weight: 193 lb 3.2 oz (87.6 kg) 192 lb 12.8 oz (87.5 kg) 192 lb 9.6 oz (87.4 kg)    Telemetry    Sinus rhythm in the 70's.  - Personally Reviewed  ECG    No new tracings - Personally Reviewed  Physical Exam   GEN: No acute distress.   Neck: No JVD Cardiac: RRR, 2/6 murmur, No rubs, or gallops.  Respiratory: Clear  to auscultation bilaterally. GI: Soft, nontender, non-distended  MS: Trace LE edema; No deformity. Neuro:  Nonfocal  Psych: Normal affect   Labs    Chemistry Recent Labs  Lab 05/24/17 0213 05/25/17 0253 05/26/17 0246  NA 141 143 142  142  K 3.5 3.9 3.5  3.5  CL 100* 100* 100*  100*  CO2 29 31 30  30   GLUCOSE 97 106* 99  98  BUN 34* 36* 38*  38*  CREATININE 2.70* 2.95* 3.06*  3.08*  CALCIUM 9.6 9.7 9.7  9.6  ALBUMIN  --   --  2.8*  GFRNONAA 17* 15* 14*  14*  GFRAA 19* 17* 17*  16*  ANIONGAP 12 12 12  12      Hematology Recent Labs  Lab 05/22/17 1102  WBC 7.4  RBC 3.40*  HGB 10.4*  HCT 32.9*  MCV 96.8  MCH 30.6  MCHC 31.6  RDW 17.1*  PLT 200    Cardiac Enzymes Recent Labs  Lab 05/22/17 1827 05/22/17 2123 05/23/17 0327 05/23/17 0941  TROPONINI 0.03* 0.28* 0.25* 0.21*    Recent Labs  Lab 05/22/17 1352  TROPIPOC 0.12*     BNP Recent Labs  Lab 05/22/17 1102  BNP 800.0*     DDimer No results for input(s): DDIMER in the last 168 hours.   Radiology    No results found.  Cardiac Studies   Echocardiogram 05/23/17 Study Conclusions - Left ventricle: The cavity size was normal. There was severe   concentric hypertrophy. Systolic function was normal. The   estimated ejection fraction was in the range of 60% to 65%. Wall   motion was normal; there were no regional wall motion   abnormalities. Doppler parameters are consistent with abnormal   left ventricular relaxation (grade 1 diastolic dysfunction).   Doppler parameters are consistent with elevated mean left atrial   filling pressure. - Ventricular septum: Septal motion showed paradox. These changes   are consistent with a left bundle branch block. - Aortic valve: Transvalvular velocity was increased more than   expected, due to high stroke volume. There was mild stenosis.   There was severe regurgitation directed centrally in the LVOT.   Severe regurgitation is suggested by an  aortic regurgitation PHT   <= 250 ms and a vena contracta >= 6 mm. Valve area (VTI): 2.05   cm^2. Valve area (Vmax): 1.82 cm^2. Valve area (Vmean): 1.97   cm^2. - Aortic root: Suspicious for aneurysm of the right coronary sinus   of Valsalva or aneurysm of the proximal right coronary artery. - Mitral valve: There was mild to moderate regurgitation directed   eccentrically and posteriorly. - Left atrium: The atrium was mildly dilated. - Pulmonary arteries: Systolic pressure was mildly increased. PA   peak pressure: 43 mm Hg (S). - Pericardium, extracardiac: A moderate to large, free-flowing   pericardial effusion was identified circumferential to the heart.   The fluid had no internal echoes.  Impressions: - There is equivocal evidence for pericardial tamponade. While   there is diastolic collapse of the right atrium and there is   plethora of the inferior vena cava, there is no right ventricular   collapse and there is no enhanced respiratory flow variation   across the AV valves. Overall, the dominant abnormality appears   to be decompensated diastolic heart failure due to hypertensive   hypertrophic cardiomyopathy and severe aortic insufficiency.   The mechanism of aortic insufficiency is not immediately obvious.   There is no evidence fo ascending aortic aneurysm or dissection,   but there is suspicion for aneurysm of the right sinus of   Valsalva (alternately this may be an aneurysm of the proximal   right coronary artery).  Patient Profile     72 y.o. female with a hx of known chronic pericardial effusion since 2018 (however progressively becoming larger), diastolic heart failure (EF 60-65% with moderate aortic insufficiency and mitral regurgitationwithgrade 1 DD (per echo 04/20/17), LBBB, CAD, chronic bilateral LE edema, HTN, and CKD stage III who is being seen for the evaluation oflarge pericardial effusion andsevereSOBat the request ofDr. [place]      Acute on chronic diastolic CHF: EF 16-10% by echo 04/20/17 and confirmed with ECHO 05/23/17 with hypertensive hypertrophic cardiomyopathy. Pt being diuresed with IV lasix and spironolactone, de-escalated due to bump in SCr. Plan for R&L heart cath once renal function stable. Pt breathing better and edema better. She thinks she would be able to lay flat.   Moderated pericardial effusion: Pt seen by CT surgeon, Dr. Prescott Gum who felt this was moderate chronic pericardial effusion from diastolic heart failure and that her symptoms are more likely from severe AI and  pulmonary edema. Not felt to need pericardial window.   Acute on chronic CKD stage IV: Baseline Cr ~2.0. SCr 3.06 today. Lasix on hold. Will hold spironolactone. Pt followed by Dr. Jimmy Footman as outpatient. Recommend nephro consult especially with planned cath.   Hypertension: ARB on hold for renal function. Hydralazine and labetalol increased, isosorbide added. Lasix dc'd yesterday, continues on spironolactone 50 mg. Will hold spironolactone.   Severe aortic insufficiency: Breathing improved with diuresis. Planned for R&L heart cath once renal function improves.  Considering TEE when able to lie flat which she thinks she can do now.   For questions or updates, please contact [place] Please consult [email or web address] for contact info under Cardiology/STEMI.      Signed, Daune Perch, NP  05/26/2017, 10:27 AM    History and all data above reviewed.  Patient examined.  I agree with the findings as above.  The patient exam reveals COR:RRR  ,  Lungs: Decreased breath sounds at the left base  ,  Abd: Positive bowel sounds, no rebound no guarding, Ext No edema   .  All available labs, radiology testing, previous records reviewed. Agree with documented assessment and plan. Acute on chronic diastolic HF:  This has been a difficult situation.  She has increasing pericardial effusion which increased quickly.  She has LVH (and interestingly has a  family history of her son apparently dying with HCM.)  SPEP was negative but we had not yet completed any other work up of LVH.  Her issue is likely related to HTN.  She has AI which appears to be severe.  She has mild/mod MR.  I did review the echo films myself today.  My plan was for possible out patient right and left heart cath.  She will need that while she is here.  However, she has CKD with increased creat since diuresis.  We held the PM Lasix yesterday and will not give diuresis today.  I will check on her in the AM and plan possible cath.  If her creat is not coming down I might consider TEE as she will need this as well.  We will consult renal to get them involved since she will need a cath at some point during this admission.  I suspect that she will need valve surgery with a high probability of progressing to dialysis dependent renal failure.     Jeneen Rinks Massiah Longanecker  11:35 AM  05/26/2017

## 2017-05-26 NOTE — Consult Note (Signed)
[name] Admit Date: 05/22/2017 05/26/2017 Rexene Agent Requesting Physician:  Percival Spanish MD  Reason for Consult:  CKD4, CHF Exacerbation HPI:  72 year old female seen for evaluation of CKD 4.  Patient admitted 2/14 with orthopnea and progressive dyspnea.  This is in the context of a chronic pericardial effusion and history of severe aortic insufficiency with diastolic heart failure.  Pericardial effusion is felt not to be the primary cause of her pulmonary symptoms.  Given her valvular disease the plan is for evaluation for potential aortic valve replacement.  PMH Incudes:  CKD 4 followed by Dr. Jimmy Footman in our office; baseline creatinine is labile around 2.5  CAD  Chronic lower extremity edema hypertension on  Hydralazine, labetalol, torsemide, losartan, spironolactone  Patient has been diuresed net -5.9 L and a 4 pound reduction in her weight from admission.  Her creatinine has increased mildly to a value of 3.06 today with a BUN of 38, potassium 3.7, bicarbonate 30, sodium of 142.  Old renal ultrasound from 2000 demonstrated no structural renal issues  In order to move forward for potential aortic valve repair she will need left and right heart catheterization with contrast exposure prior to consideration of cardiac surgery.  She has already discussed with cardiology potential renal risks of these procedures but she recognizes that she cannot live well with chronic dyspnea.  She denies use of nonsteroidals.   Creatinine  Date Value  05/08/2017 2.8 (A)  03/05/2017 3.1 mg/dL [facility])   Creatinine, Ser (mg/dL)  Date Value  05/26/2017 3.08 (H)  05/26/2017 3.06 (H)  05/25/2017 2.95 (H)  05/24/2017 2.70 (H)  05/23/2017 2.53 (H)  05/22/2017 2.62 (H)  04/29/2017 2.28 (H)  04/24/2017 2.81 (H)  04/20/2017 2.00 (H)  04/19/2017 2.06 (H)  ] I/Os:  ROS NSAIDS: No exposure IV Contrast no current exposure TMP/SMX no exposure Hypotension not present Balance of 12 systems is  negative w/ exceptions as above  PMH  Past Medical History:  Diagnosis Date  . Acute pancreatitis 02/04/2017   [name] 02/04/2017  . Adenomatous colon polyp 01/2002  . Anemia, chronic disease    /notes 02/04/2017  . Aortic valve disease    AI/AS  . Arthritis    "lower back, knees" (02/04/2017)  . CHF (congestive heart failure) ([place])   . CKD (chronic kidney disease), stage III ([place])   . Diverticulosis of colon   . DJD (degenerative joint disease)   . Frequent UTI   . Hemorrhoids   . Hypertension   . Lumbar back pain   . Neuropathy   . Obesity   . Pericardial effusion     in a patient with Diastolic heart  failure and Pericardial effusion  known since last 2 D echo in 11/2016 /notes 02/04/2017  . PONV (postoperative nausea and vomiting)   . Pulmonary HTN ([place])   . Renal cyst   . Venous insufficiency   . Vitamin D deficiency    PSH  Past Surgical History:  Procedure Laterality Date  . ABDOMINAL HYSTERECTOMY     "partial"  . COLONOSCOPY W/ BIOPSIES AND POLYPECTOMY     "bx was ok"   FH  Family History  Problem Relation Age of Onset  . Diabetes Mother   . Colon polyps Mother   . Hypertension Mother   . Hypertension Brother   . Hypertension Sister   . Colon cancer Neg Hx    SH  reports that she quit smoking about 35 years ago. Her smoking use included cigarettes. She has  a 6.00 pack-year smoking history. she has never used smokeless tobacco. She reports that she does not drink alcohol or use drugs. Allergies  Allergies  Allergen Reactions  . Minoxidil Palpitations and Other (See Comments)    unable to sleep  . Naproxen Swelling   Home medications Prior to Admission medications   Medication Sig Start Date End Date Taking? Authorizing Provider  amLODipine (NORVASC) 10 MG tablet Take 1 tablet (10 mg total) by mouth daily. 05/01/14  Yes Velvet Bathe, MD  calcitRIOL (ROCALTROL) 0.5 MCG capsule Take 0.5 mcg by mouth daily.   Yes [provider]  calcium  carbonate (TUMS - DOSED IN MG ELEMENTAL CALCIUM) 500 MG chewable tablet Chew 2 tablets by mouth daily as needed for indigestion or heartburn.   Yes [provider]  FAMOTIDINE PO Take 1 tablet by mouth 2 (two) times daily as needed (gas).   Yes [provider]  fluticasone (FLONASE) 50 MCG/ACT nasal spray Place 2 sprays daily into both nostrils. 02/12/17  Yes Debbe Odea, MD  gabapentin (NEURONTIN) 300 MG capsule TAKE 1 CAPSULE(300 MG) BY MOUTH THREE TIMES DAILY AS NEEDED FOR PAIN 01/14/17  Yes Hoyt Koch, MD  hydrALAZINE (APRESOLINE) 25 MG tablet Take 25 mg by mouth 2 (two) times daily.   Yes [provider]  hydrocortisone (ANUSOL-HC) 2.5 % rectal cream Place 1 application rectally as needed. Patient taking differently: Place 1 application rectally daily as needed for hemorrhoids.  05/24/13  Yes Noralee Space, MD  labetalol (NORMODYNE) 200 MG tablet TAKE 2 TABLETS BY MOUTH TWICE DAILY Patient taking differently: 400mg  by mouth twice daily   Yes Noralee Space, MD  losartan (COZAAR) 100 MG tablet Take 1 tablet (100 mg total) by mouth daily. 04/20/17  Yes Truett Mainland, DO  ondansetron (ZOFRAN) 4 MG tablet Take 1 tablet (4 mg total) by mouth every 8 (eight) hours as needed for nausea or vomiting. 01/04/16  Yes Hoyt Koch, MD  Propylene Glycol (SYSTANE BALANCE) 0.6 % SOLN Place 1 drop into both eyes daily as needed (for dry eyes).   Yes [provider]  spironolactone (ALDACTONE) 25 MG tablet Take 2 tablets (50 mg total) daily by mouth. 02/11/17  Yes Debbe Odea, MD  torsemide (DEMADEX) 20 MG tablet TAKE 2 TABLETS BY MOUTH EVERY MORNING Patient taking differently: 40mg  by mouth every evening 01/18/13  Yes Noralee Space, MD  traMADol (ULTRAM) 50 MG tablet Take 1 tablet (50 mg total) by mouth 3 (three) times daily as needed for severe pain. 12/16/16  Yes Duffy Bruce, MD  triamcinolone ointment (KENALOG) 0.5 % Apply 1 application topically 2  (two) times daily. Patient taking differently: Apply 1 application topically daily as needed (itching).  08/15/16  Yes Hoyt Koch, MD  pantoprazole (PROTONIX) 40 MG tablet Take 1 tablet (40 mg total) daily by mouth. Patient not taking: Reported on 05/22/2017 02/12/17   Debbe Odea, MD  PROCRIT 38182 UNIT/ML injection Inject 40,000 Units into the skin every 14 (fourteen) days. 06/26/16   [provider]    Current Medications Scheduled Meds: . aspirin EC  81 mg Oral Daily  . calcitRIOL  0.5 mcg Oral Daily  . enoxaparin (LOVENOX) injection  30 mg Subcutaneous Q24H  . fluticasone  2 spray Each Nare Daily  . gabapentin  300 mg Oral TID  . hydrALAZINE  75 mg Oral Q8H  . isosorbide mononitrate  30 mg Oral Daily  . labetalol  600 mg Oral BID  .  sodium chloride flush  3 mL Intravenous Q12H   Continuous Infusions: . sodium chloride Stopped (05/24/17 1146)   PRN Meds:.sodium chloride, acetaminophen, albuterol, famotidine, hydrALAZINE, hydrocortisone, ondansetron (ZOFRAN) IV, polyvinyl alcohol, sodium chloride flush  CBC Recent Labs  Lab 05/22/17 1102  WBC 7.4  HGB 10.4*  HCT 32.9*  MCV 96.8  PLT 163   Basic Metabolic Panel Recent Labs  Lab 05/22/17 1102 05/23/17 0327 05/24/17 0213 05/25/17 0253 05/26/17 0246  NA 143 143 141 143 142  142  K 4.0 3.8 3.5 3.9 3.5  3.5  CL 103 101 100* 100* 100*  100*  CO2 28 29 29 31 30  30   GLUCOSE 118* 99 97 106* 99  98  BUN 32* 32* 34* 36* 38*  38*  CREATININE 2.62* 2.53* 2.70* 2.95* 3.06*  3.08*  CALCIUM 9.7 9.7 9.6 9.7 9.7  9.6  PHOS  --   --   --   --  3.6    Physical Exam  Blood pressure (!) 113/35, pulse 84, temperature 98.5 F (36.9 C), temperature source Oral, resp. rate 18, height 5\' 6"  (1.676 m), weight 87.4 kg (192 lb 9.6 oz), SpO2 97 %. GEN: No acute distress, pleasant ENT: NCAT EYES: EOMI CV: Regular rhythm, has early diastolic murmur and mid systolic murmur, normal rate  PULM: CTA B  ABD: soft,  nontender SKIN: No rashes or lesions EXT: Trace edema bilaterally   Assessment 72 year old female with CKD 4 and mild worsening of her GFR in the setting of IV diuresis for diastolic heart failure exacerbation and severe aortic insufficiency.  Patient needs evaluation for potential aortic valve surgery which would involve increased risk of acute on chronic renal failure from contrast exposure and perioperative issues.  1. CKD 4, discussed real risk of progressive renal failure related to moving forward, also acknowledging that living with dyspnea to this degree would be very difficult for her; she is processing this. 2. Diastolic heart failure with aortic insufficiency: Diuretics have been held, I agree. 3. Secondary hyperparathyroidism on calcitriol 4. Hypertension with ARB on hold; continued on hydralazine, labetalol, isosorbide 5. CAD 6. Chronic moderate pericardial effusion, to date not felt to merit intervention  Plan 1. Patient will continue processing renal risks for cardiac evaluation; we will continue to discuss with her 2. Continue to hold diuretics 3. Daily weights, Daily Renal Panel, Strict I/Os, Avoid nephrotoxins (NSAIDs, judicious IV Contrast)    Pearson Grippe MD [phone] pgr 05/26/2017, 4:21 PM

## 2017-05-26 NOTE — Progress Notes (Signed)
PROGRESS NOTE    [name]  TKP:[id number] DOB: 03/29/1946 DOA: 05/22/2017 PCP: Hoyt Koch, MD  Outpatient Specialists:   Brief Narrative: Patient is a 72 year old African-American female, obese, with past medical history significant for chronic kidney disease stage IV, diastolic congestive heart failure, hypertension, chronic pericardial effusion, moderate to severe aortic incompetence, mild to moderate mitral regurgitation, and pulmonary hypertension.  According to the patient, she has been having worsening shortness of breath, dyspnea on exertion, orthopnea and leg edema.  The symptoms became so severe yesterday when she went for an outpatient echocardiogram and CT scan of the lung.  Apparently, the patient has been following up with the pulmonologist who is trying to rule out possible pulmonary disease.  The patient is also known to the cardiology team.  The patient was admitted with acute on chronic diastolic congestive heart failure, severe pericardial effusion, accelerated hypertension, and mildly elevated troponin that could easily be type II elevation.  With diuresis, the patient's symptoms are improving.  Cardiology and cardiothoracic teams have been consulted to assist with patient's management.  Cardiology team maintains possible TEE, right and left cardiac current very early next week if feasible (possibly on 05/26/2017).  Possible valve replacement surgery and draining of the pericardial fluid at the same time remains a possibility.  Echocardiogram is being pursued.  Apparently, last echo done was not complete.  At some point, when the patient is able to lay down flat, CT of the chest will be considered as well.  No headache, no neck pain, no chest pain, no fever or chills and no GI symptoms.  His shortness of breath is improving.  The patient's leg edema is also improving.  The patient reports overall, mild improvement.  05/24/17: Patient seen.  Patient looks a lot better  today.  Blood pressure is controlled.  Edema is less.  Shortness of breath has improved significantly.  Overall, patient is doing a lot better.  Cardiology input is appreciated.  Renal function is stable.  For possible right and left heart cart on Monday (05/26/2017).  05/25/2017: Patient seen alongside patient's husband.  Patient looks a lot better today.  His shortness of breath has improved significantly.  Leg edema has also improved significantly.  Will discontinue IV Lasix.  Will start patient on oral torsemide 40 mg orally once daily (patient's home dose) from tomorrow.  Continue to monitor renal function closely.  Will discontinue losartan.  Will increase the dose of hydralazine and labetalol.  Apparently, patient has creatinine bumped from 2.7-2.95.  Possible cardiac cath in the morning.  Patient is at risk for contrast-induced nephropathy.  Continue to optimize patient prior to cardiac catheterization.  Monitor renal function closely after cardiac cath.  Cardiology and cardiothoracic input is highly appreciated.  05/26/2017: Patient seen.  Shortness of breath has resolved significantly.  Edema has resolved significantly.  Worsening renal function is noted.  Diuretics none on hold.  Cardiac catheterization has been postponed.  No chest pain, no shortness of breath.  Nephrology input is appreciated.  The patient continues to possess risk of contrast-induced nephropathy.  Although one can do for now is to optimize patient prior to cardiac catheterization.  Assessment & Plan:   Principal Problem:   Diastolic congestive heart failure (HCC) Active Problems:   Essential hypertension   CKD (chronic kidney disease) stage 4, GFR 15-29 ml/min (HCC)   Pericardial effusion   Nonrheumatic aortic valve insufficiency   Hypertensive emergency    Acute on chronic diastolic congestive heart failure: -  Continue IV Lasix 80 mg twice daily. -Continue Aldactone. -Complete echocardiogram. -Optimize blood  pressure control. -Continue nitro drip for now. -Wean off nitro drip. -Optimize blood pressure. -Continue to monitor I's and O's. -Overall, the congestive heart failure is improving. -05/25/2017: Discontinue IV Lasix.  Start torsemide in the morning.  Continue to monitor renal function.  05/26/2016: Diuretics are on hold.  Continue to monitor patient.  Edema has resolved significantly.  Hypertensive urgency: -This could be related to significant dyspnea following CHF exacerbation. -Possibly, this could have also led to the patient's heart failure exacerbation. -Cautiously optimize blood pressure. -Hopefully, weight improvement in patient's dyspnea, the blood pressure will continue to improve. -Cautious use of nitro drip. -05/24/2017: Wean patient off of nitro.  Optimize blood pressure. -05/25/2017: DC losartan.  Increase the dose of hydralazine to 50 mg p.o. 3 times daily.  Increase the dose of labetalol to 600 mg p.o. twice daily.  Continue to monitor patient's blood pressure. -05/26/2017: Blood pressure is optimize.  Continue current medications.  Avoid nephrotoxins.  Chronic kidney disease stage IV, with possible acute kidney injury, possibly cardiorenal: -There has been mild interval improvement in patient's renal function with diuresis. -Optimize congestive heart failure management. -Avoid nephrotoxins. -Dose all medications considering renal impairment. -Nephro protective measures if cardiac catheterization is pursued. -The kidney disease is stable. -05/25/2017: Serum creatinine has gone from 2.7-2.95.  Will discontinue IV Lasix. 05/26/2017: Serum creatinine is 3.06 today.  Torsemide is on hold.  Continue to monitor.  Valvular heart disease: -Patient has severe aortic incompetence. -Patient has moderate mitral regurgitation. -Cardiology and cardiothoracic input is appreciated. -For possible valve replacement surgery.  Severe pericardial effusion: -Etiology remains  uncertain. -As per cardiology, drainage can be done at the time of volvulus surgery if possible. -Continue to monitor for tamponade. -Guarded prognosis.  Acute respiratory failure with hypoxia: -This likely multifactorial (likely secondary to combined effect of above). -Low threshold to consult pulmonary team to complete pulmonary workup. -05/24/2017: This has resolved significantly.  Patient was without oxygen when I saw her.  GERD: Cont H2 blocker  Dry eyes: Cont eye drops  Allergies: Cont flonase  Chronic pain: Cont gabapentin Hold tramadol  Code Status:  Full.    DVT Prophylaxis: lovenox Family Communication:  Disposition Plan:  This will depend on hospital course.     Consultants:   Cardiology.    Cardiothoracic surgery.  Procedures:   Incomplete echo.  Antimicrobials:   None   Subjective: Shortness of breath has resolved significantly. Patient feels a lot better. No chest pain. No fever or chills.  Objective: Vitals:   05/26/17 0957 05/26/17 1155 05/26/17 1630 05/26/17 1633  BP:  (!) 113/35 (!) 123/44 (!) 123/44  Pulse: 84   66  Resp:      Temp:   97.7 F (36.5 C)   TempSrc:   Oral   SpO2:      Weight:      Height:        Intake/Output Summary (Last 24 hours) at 05/26/2017 1743 Last data filed at 05/25/2017 1900 Gross per 24 hour  Intake -  Output 101 ml  Net -101 ml   Filed Weights   05/24/17 0500 05/25/17 0534 05/26/17 0351  Weight: 87.6 kg (193 lb 3.2 oz) 87.5 kg (192 lb 12.8 oz) 87.4 kg (192 lb 9.6 oz)    Examination:  General exam: Patient is much more comfortable today.  The patient is not in any distress.  Patient is awake, alert and oriented to  time, place and person.  Patient is obese.   Respiratory system: Improved air entry. Cardiovascular system: S1 & S2, systolic and diastolic murmur.  Leg edema.   Gastrointestinal system: Abdomen is obese, soft and nontender.  Organs are difficult to assess.  Central nervous  system: Alert and oriented. No focal neurological deficits. Extremities: Very minimal leg edema.  Data Reviewed: I have personally reviewed following labs and imaging studies  CBC: Recent Labs  Lab 05/22/17 1102  WBC 7.4  HGB 10.4*  HCT 32.9*  MCV 96.8  PLT 263   Basic Metabolic Panel: Recent Labs  Lab 05/22/17 1102 05/23/17 0327 05/24/17 0213 05/25/17 0253 05/26/17 0246  NA 143 143 141 143 142  142  K 4.0 3.8 3.5 3.9 3.5  3.5  CL 103 101 100* 100* 100*  100*  CO2 28 29 29 31 30  30   GLUCOSE 118* 99 97 106* 99  98  BUN 32* 32* 34* 36* 38*  38*  CREATININE 2.62* 2.53* 2.70* 2.95* 3.06*  3.08*  CALCIUM 9.7 9.7 9.6 9.7 9.7  9.6  PHOS  --   --   --   --  3.6   GFR: Estimated Creatinine Clearance: 18.6 mL/min (A) (by C-G formula based on SCr of 3.08 mg/dL (H)). Liver Function Tests: Recent Labs  Lab 05/26/17 0246  ALBUMIN 2.8*   No results for input(s): LIPASE, AMYLASE in the last 168 hours. No results for input(s): AMMONIA in the last 168 hours. Coagulation Profile: No results for input(s): INR, PROTIME in the last 168 hours. Cardiac Enzymes: Recent Labs  Lab 05/22/17 1827 05/22/17 2123 05/23/17 0327 05/23/17 0941  TROPONINI 0.03* 0.28* 0.25* 0.21*   BNP (last 3 results) Recent Labs    11/27/16 1036  PROBNP 202.0*   HbA1C: No results for input(s): HGBA1C in the last 72 hours. CBG: No results for input(s): GLUCAP in the last 168 hours. Lipid Profile: No results for input(s): CHOL, HDL, LDLCALC, TRIG, CHOLHDL, LDLDIRECT in the last 72 hours. Thyroid Function Tests: No results for input(s): TSH, T4TOTAL, FREET4, T3FREE, THYROIDAB in the last 72 hours. Anemia Panel: No results for input(s): VITAMINB12, FOLATE, FERRITIN, TIBC, IRON, RETICCTPCT in the last 72 hours. Urine analysis:    Component Value Date/Time   COLORURINE STRAW (A) 02/05/2017 2215   APPEARANCEUR CLEAR 02/05/2017 2215   LABSPEC 1.006 02/05/2017 2215   PHURINE 5.0 02/05/2017  2215   GLUCOSEU NEGATIVE 02/05/2017 2215   GLUCOSEU NEGATIVE 12/08/2015 1024   HGBUR NEGATIVE 02/05/2017 2215   BILIRUBINUR NEGATIVE 02/05/2017 2215   BILIRUBINUR neg 01/04/2016 1054   [place] 02/05/2017 2215   PROTEINUR NEGATIVE 02/05/2017 2215   UROBILINOGEN negative 01/04/2016 1054   UROBILINOGEN 0.2 12/08/2015 1024   NITRITE NEGATIVE 02/05/2017 2215   LEUKOCYTESUR NEGATIVE 02/05/2017 2215   Sepsis Labs: @LABRCNTIP (procalcitonin:4,lacticidven:4)  )No results found for this or any previous visit (from the past 240 hour(s)).       Radiology Studies: No results found.      Scheduled Meds: . aspirin EC  81 mg Oral Daily  . calcitRIOL  0.5 mcg Oral Daily  . enoxaparin (LOVENOX) injection  30 mg Subcutaneous Q24H  . fluticasone  2 spray Each Nare Daily  . gabapentin  300 mg Oral TID  . hydrALAZINE  75 mg Oral Q8H  . isosorbide mononitrate  30 mg Oral Daily  . labetalol  600 mg Oral BID  . sodium chloride flush  3 mL Intravenous Q12H   Continuous Infusions: .  sodium chloride Stopped (05/24/17 1146)     LOS: 4 days    Time spent: 35 minutes.    Dana Allan, MD  [facility] Pager #: [phone] 7PM-7AM contact night coverage as above

## 2017-05-27 ENCOUNTER — Telehealth: Payer: Self-pay | Admitting: Internal Medicine

## 2017-05-27 LAB — BASIC METABOLIC PANEL
ANION GAP: 11 (ref 5–15)
BUN: 38 mg/dL — ABNORMAL HIGH (ref 6–20)
CHLORIDE: 101 mmol/L (ref 101–111)
CO2: 29 mmol/L (ref 22–32)
Calcium: 9.6 mg/dL (ref 8.9–10.3)
Creatinine, Ser: 3.57 mg/dL — ABNORMAL HIGH (ref 0.44–1.00)
GFR calc Af Amer: 14 mL/min — ABNORMAL LOW (ref >60)
GFR calc non Af Amer: 12 mL/min — ABNORMAL LOW (ref >60)
GLUCOSE: 85 mg/dL (ref 65–99)
Potassium: 4.2 mmol/L (ref 3.5–5.1)
Sodium: 141 mmol/L (ref 135–145)

## 2017-05-27 MED ORDER — GABAPENTIN 300 MG PO CAPS
300.0000 mg | ORAL_CAPSULE | Freq: Three times a day (TID) | ORAL | Status: DC | PRN
  Administered 2017-05-28: 300 mg via ORAL
  Filled 2017-05-27: qty 1

## 2017-05-27 NOTE — Progress Notes (Signed)
Progress Note  Patient Name: [name] Date of Encounter: 05/27/2017  Primary Cardiologist: Minus Breeding, MD   Subjective   [name] is dong well today, bright and cheerful. She denies dyspnea or chest discomfort. She feels that she is back to her normal. She says she can lay flat without dyspnea.   Inpatient Medications    Scheduled Meds: . aspirin EC  81 mg Oral Daily  . calcitRIOL  0.5 mcg Oral Daily  . enoxaparin (LOVENOX) injection  30 mg Subcutaneous Q24H  . fluticasone  2 spray Each Nare Daily  . gabapentin  300 mg Oral TID  . hydrALAZINE  75 mg Oral Q8H  . isosorbide mononitrate  30 mg Oral Daily  . labetalol  600 mg Oral BID  . sodium chloride flush  3 mL Intravenous Q12H   Continuous Infusions: . sodium chloride Stopped (05/24/17 1146)   PRN Meds: sodium chloride, acetaminophen, albuterol, famotidine, hydrALAZINE, hydrocortisone, ondansetron (ZOFRAN) IV, polyvinyl alcohol, sodium chloride flush   Vital Signs    Vitals:   05/26/17 2013 05/27/17 0001 05/27/17 0425 05/27/17 0800  BP: (!) 137/49 (!) 116/38 (!) 127/39 (!) 127/48  Pulse: 89 82 79 78  Resp: 18 16 18    Temp: 99.6 F (37.6 C) 98.1 F (36.7 C) 98.9 F (37.2 C)   TempSrc: Oral Oral Oral   SpO2: 95% 96% 95% 96%  Weight:   193 lb (87.5 kg)   Height:        Intake/Output Summary (Last 24 hours) at 05/27/2017 0832 Last data filed at 05/26/2017 2100 Gross per 24 hour  Intake -  Output 250 ml  Net -250 ml   Filed Weights   05/25/17 0534 05/26/17 0351 05/27/17 0425  Weight: 192 lb 12.8 oz (87.5 kg) 192 lb 9.6 oz (87.4 kg) 193 lb (87.5 kg)    Telemetry    NSR 70's-80's - Personally Reviewed  ECG    No new tracings.  - Personally Reviewed  Physical Exam   GEN: No acute distress.   Neck: No JVD Cardiac: RRR, 2-3/6 murmur Respiratory: Clear to auscultation bilaterally. GI: Soft, nontender, non-distended  MS: Trace lower ext edema; No deformity. Neuro:  Nonfocal  Psych: Normal  affect   Labs    Chemistry Recent Labs  Lab 05/25/17 0253 05/26/17 0246 05/27/17 0205  NA 143 142  142 141  K 3.9 3.5  3.5 4.2  CL 100* 100*  100* 101  CO2 31 30  30 29   GLUCOSE 106* 99  98 85  BUN 36* 38*  38* 38*  CREATININE 2.95* 3.06*  3.08* 3.57*  CALCIUM 9.7 9.7  9.6 9.6  ALBUMIN  --  2.8*  --   GFRNONAA 15* 14*  14* 12*  GFRAA 17* 17*  16* 14*  ANIONGAP 12 12  12 11      Hematology Recent Labs  Lab 05/22/17 1102  WBC 7.4  RBC 3.40*  HGB 10.4*  HCT 32.9*  MCV 96.8  MCH 30.6  MCHC 31.6  RDW 17.1*  PLT 200    Cardiac Enzymes Recent Labs  Lab 05/22/17 1827 05/22/17 2123 05/23/17 0327 05/23/17 0941  TROPONINI 0.03* 0.28* 0.25* 0.21*    Recent Labs  Lab 05/22/17 1352  TROPIPOC 0.12*     BNP Recent Labs  Lab 05/22/17 1102  BNP 800.0*     DDimer No results for input(s): DDIMER in the last 168 hours.   Radiology    No results found.  Cardiac Studies  Echocardiogram 05/23/17 Study Conclusions - Left ventricle: The cavity size was normal. There was severe concentric hypertrophy. Systolic function was normal. The estimated ejection fraction was in the range of 60% to 65%. Wall motion was normal; there were no regional wall motion abnormalities. Doppler parameters are consistent with abnormal left ventricular relaxation (grade 1 diastolic dysfunction). Doppler parameters are consistent with elevated mean left atrial filling pressure. - Ventricular septum: Septal motion showed paradox. These changes are consistent with a left bundle branch block. - Aortic valve: Transvalvular velocity was increased more than expected, due to high stroke volume. There was mild stenosis. There was severe regurgitation directed centrally in the LVOT. Severe regurgitation is suggested by an aortic regurgitation PHT <= 250 ms and a vena contracta >= 6 mm. Valve area (VTI): 2.05 cm^2. Valve area (Vmax): 1.82 cm^2. Valve area  (Vmean): 1.97 cm^2. - Aortic root: Suspicious for aneurysm of the right coronary sinus of Valsalva or aneurysm of the proximal right coronary artery. - Mitral valve: There was mild to moderate regurgitation directed eccentrically and posteriorly. - Left atrium: The atrium was mildly dilated. - Pulmonary arteries: Systolic pressure was mildly increased. PA peak pressure: 43 mm Hg (S). - Pericardium, extracardiac: A moderate to large, free-flowing pericardial effusion was identified circumferential to the heart. The fluid had no internal echoes.  Impressions: - There is equivocal evidence for pericardial tamponade. While there is diastolic collapse of the right atrium and there is plethora of the inferior vena cava, there is no right ventricular collapse and there is no enhanced respiratory flow variation across the AV valves. Overall, the dominant abnormality appears to be decompensated diastolic heart failure due to hypertensive hypertrophic cardiomyopathy and severe aortic insufficiency. The mechanism of aortic insufficiency is not immediately obvious. There is no evidence fo ascending aortic aneurysm or dissection, but there is suspicion for aneurysm of the right sinus of Valsalva (alternately this may be an aneurysm of the proximal right coronary artery).   Patient Profile     72 y.o. female with a hx of known chronic pericardial effusion since 2018 (however progressively becoming larger), diastolic heart failure (EF 60-65% with moderate aortic insufficiency and mitral regurgitationwithgrade 1 DD (per echo 04/20/17), LBBB, CAD, chronic bilateral LE edema, HTN, and CKD stage III who is being seen for the evaluation oflarge pericardial effusion andsevereSOB.   Assessment & Plan    Acute on chronic diastolic CHF: She has LVH (and interestingly has a family history of her son apparently dying with HCM.)  SPEP was negative but we had not yet  completed any other work up of LVH.  Her issue is likely related to HTN. EF 60-65% by echo 05/23/17 with hypertensive cardiomyopathy and severe AR and suspicion of right sinus of valsalva (alternatively this may be an aneurysm of the proximal RCA). The patient was diuresed and has had much improvement in breathing and edema. Diuretics are on hold due to worsening renal function. There is plan for R&L heart cath once renal function improves.   Severe aortic insufficiency: Severe on echo with mild-mod MR. Breathing improved with diuresis. Planned for R&L heart cath once renal function improves. Also plan for TEE. Pt says she can lay flat without dyspnea. TEE scheduled for tomorrow at noon.  Risks of esophageal damage, perforation (1:10,000 risk), bleeding, pharyngeal hematoma as well as other potential complications associated with conscious sedation including aspiration, arrhythmia, respiratory failure and death have been explained to the pt and she wishes to proceed.   Moderate pericardial  effusion: Pt seen by CT surgeon, Dr. Prescott Gum who felt that the effusion was moderate chronic pericardial effusion from diastolic heart failure and that her symptoms are more likely from severe AI and pulmonary edema. Not felt to need pericardial window.   Acute on chronic CKD stage IV: Baseline Cr ~2-2.5. SCr has been worsening. Today up to 3.57. The patient is followed outpatient by Dr. Jimmy Footman. Nephrology has been consulted due to worsening renal function and need for cardiac cath with the potential for contrast nephropathy. Also the patient will likely need valve surgery with a high probability of progressing to dialysis dependent renal failure. Dr. Joelyn Oms recommended to continue to hold diuretics and nephrology will continue to discuss with the patient her renal risks in the setting of upcoming procedures.   Hypertension: ARB on hold due to renal function. Hydralazine and labetalol increased, isosorbide added. BP  currently well controlled.    For questions or updates, please contact [place] Please consult [email or web address] for contact info under Cardiology/STEMI.      Signed, Daune Perch, NP  05/27/2017, 8:32 AM    History and all data above reviewed.  Patient examined.  I agree with the findings as above.  She feels well and can lie flat.  She denies pain.  Breathing is back to baseline.  The patient exam reveals COR:RRR, 3/6 apical systolic murmur, no diastolic murmurs  ,  Lungs: Clear  ,  Abd: Positive bowel sounds, no rebound no guarding, Ext No edema  .  All available labs, radiology testing, previous records reviewed. Agree with documented assessment and plan. AS/AI:  Cannot cath at this point with the creat going up.  We do need a TEE and we will plan for this in the AM.  Holding on further diuresis.   Jeneen Rinks Cloyce Blankenhorn  10:54 AM  05/27/2017

## 2017-05-27 NOTE — Telephone Encounter (Signed)
ono 05/20/17  < 88% at 2h 38 min - start 2LNC at night

## 2017-05-27 NOTE — Progress Notes (Signed)
Admit: 05/22/2017 LOS: 5  89F with AoCKD4 and dCHF exacerbation with severe AI in need of eval for AV repair  Subjective:  Feels well, denies dyspnea, able to lie flat. A.m. labs reviewed: Serum creatinine increased to 3.57, K4.2, bicarbonate 29 All diuretics on hold Likely to have TEE today  02/18 0701 - 02/19 0700 In: -  Out: 250 [Urine:250]  Filed Weights   05/25/17 0534 05/26/17 0351 05/27/17 0425  Weight: 87.5 kg (192 lb 12.8 oz) 87.4 kg (192 lb 9.6 oz) 87.5 kg (193 lb)    Scheduled Meds: . aspirin EC  81 mg Oral Daily  . calcitRIOL  0.5 mcg Oral Daily  . enoxaparin (LOVENOX) injection  30 mg Subcutaneous Q24H  . fluticasone  2 spray Each Nare Daily  . gabapentin  300 mg Oral TID  . hydrALAZINE  75 mg Oral Q8H  . isosorbide mononitrate  30 mg Oral Daily  . labetalol  600 mg Oral BID  . sodium chloride flush  3 mL Intravenous Q12H   Continuous Infusions: . sodium chloride Stopped (05/24/17 1146)   PRN Meds:.sodium chloride, acetaminophen, albuterol, famotidine, hydrALAZINE, hydrocortisone, ondansetron (ZOFRAN) IV, polyvinyl alcohol, sodium chloride flush  Current Labs: reviewed    Physical Exam:  Blood pressure (!) 127/48, pulse 77, temperature 98.5 F (36.9 C), temperature source Oral, resp. rate 16, height 5\' 6"  (1.676 m), weight 87.5 kg (193 lb), SpO2 98 %. GEN: No acute distress, pleasant ENT: NCAT EYES: EOMI CV: Regular rhythm, has early diastolic murmur and mid systolic murmur, normal rate  PULM: CTA B  ABD: soft, nontender SKIN: No rashes or lesions EXT:  No edema bilaterally  A 1. CKD 4, discussed real risk of progressive renal failure related to moving forward, also acknowledging that living with dyspnea to this degree would be very difficult for her; she is processing this. 2. Diastolic heart failure with severe aortic insufficiency: Diuretics have been held 3. Secondary hyperparathyroidism on calcitriol 4. Hypertension with ARB on hold; continued on  hydralazine, labetalol, isosorbide 5. CAD 6. Chronic moderate pericardial effusion, to date not felt to merit intervention  P 1. Continue to hold diuretics at the current time 2. Would not proceed with cardiac catheterization today 3. Daily weights, Daily Renal Panel, Strict I/Os, Avoid nephrotoxins (NSAIDs, judicious IV Contrast)    Pearson Grippe MD 05/27/2017, 10:25 AM  Recent Labs  Lab 05/25/17 0253 05/26/17 0246 05/27/17 0205  NA 143 142  142 141  K 3.9 3.5  3.5 4.2  CL 100* 100*  100* 101  CO2 31 30  30 29   GLUCOSE 106* 99  98 85  BUN 36* 38*  38* 38*  CREATININE 2.95* 3.06*  3.08* 3.57*  CALCIUM 9.7 9.7  9.6 9.6  PHOS  --  3.6  --    Recent Labs  Lab 05/22/17 1102  WBC 7.4  HGB 10.4*  HCT 32.9*  MCV 96.8  PLT 200

## 2017-05-27 NOTE — Progress Notes (Signed)
PROGRESS NOTE    [name]  UVO:[id number] DOB: [birth year] DOA: 05/22/2017 PCP: Hoyt Koch, MD  Outpatient Specialists:   Brief Narrative: Patient is a 72 year old African-American female, obese, with past medical history significant for chronic kidney disease stage IV, diastolic congestive heart failure, hypertension, chronic pericardial effusion, moderate to severe aortic incompetence, mild to moderate mitral regurgitation, and pulmonary hypertension.  According to the patient, she has been having worsening shortness of breath, dyspnea on exertion, orthopnea and leg edema.  The symptoms became so severe yesterday when she went for an outpatient echocardiogram and CT scan of the lung.  Apparently, the patient has been following up with the pulmonologist who is trying to rule out possible pulmonary disease.  The patient is also known to the cardiology team.  The patient was admitted with acute on chronic diastolic congestive heart failure, severe pericardial effusion, accelerated hypertension, and mildly elevated troponin that could easily be type II elevation.  With diuresis, the patient's symptoms are improving.  Cardiology and cardiothoracic teams have been consulted to assist with patient's management.  Cardiology team maintains possible TEE, right and left cardiac current very early next week if feasible (possibly on 05/26/2017).  Possible valve replacement surgery and draining of the pericardial fluid at the same time remains a possibility.  Echocardiogram is being pursued.  Apparently, last echo done was not complete.  At some point, when the patient is able to lay down flat, CT of the chest will be considered as well.  No headache, no neck pain, no chest pain, no fever or chills and no GI symptoms.  His shortness of breath is improving.  The patient's leg edema is also improving.  The patient reports overall, mild improvement.  05/24/17: Patient seen.  Patient looks a lot better  today.  Blood pressure is controlled.  Edema is less.  Shortness of breath has improved significantly.  Overall, patient is doing a lot better.  Cardiology input is appreciated.  Renal function is stable.  For possible right and left heart cart on Monday (05/26/2017).  05/25/2017: Patient seen alongside patient's husband.  Patient looks a lot better today.  His shortness of breath has improved significantly.  Leg edema has also improved significantly.  Will discontinue IV Lasix.  Will start patient on oral torsemide 40 mg orally once daily (patient's home dose) from tomorrow.  Continue to monitor renal function closely.  Will discontinue losartan.  Will increase the dose of hydralazine and labetalol.  Apparently, patient has creatinine bumped from 2.7-2.95.  Possible cardiac cath in the morning.  Patient is at risk for contrast-induced nephropathy.  Continue to optimize patient prior to cardiac catheterization.  Monitor renal function closely after cardiac cath.  Cardiology and cardiothoracic input is highly appreciated.  05/26/2017: Patient seen.  Shortness of breath has resolved significantly.  Edema has resolved significantly.  Worsening renal function is noted.  Diuretics none on hold.  Cardiac catheterization has been postponed.  No chest pain, no shortness of breath.  Nephrology input is appreciated.  The patient continues to possess risk of contrast-induced nephropathy.  Although one can do for now is to optimize patient prior to cardiac catheterization.  05/27/2017: No new complaints.  Patient shortness of breath has resolved significantly.  Worsening serum creatinine is noted.  Diuretics are on hold.  Cardiac catheterization is planne when renal function permits.  Input from cardiology and cardiothoracic surgery is highly appreciated.  Assessment & Plan:   Principal Problem:   Diastolic congestive heart failure (  [place]) Active Problems:   Essential hypertension   CKD (chronic kidney disease) stage  4, GFR 15-29 ml/min (HCC)   Pericardial effusion   Nonrheumatic aortic valve insufficiency   Hypertensive emergency    Acute on chronic diastolic congestive heart failure: -Continue IV Lasix 80 mg twice daily. -Continue Aldactone. -Complete echocardiogram. -Optimize blood pressure control. -Continue nitro drip for now. -Wean off nitro drip. -Optimize blood pressure. -Continue to monitor I's and O's. -Overall, the congestive heart failure is improving. -05/25/2017: Discontinue IV Lasix.  Start torsemide in the morning.  Continue to monitor renal function.  05/26/2017: Diuretics are on hold.  Continue to monitor patient.  Edema has resolved significantly. -05/27/2017: CHF symptoms are stable.  Diuretics are on hold due to worsening renal function.  Continue to monitor closely.  Hypertensive urgency: -This could be related to significant dyspnea following CHF exacerbation. -Possibly, this could have also led to the patient's heart failure exacerbation. -Cautiously optimize blood pressure. -Hopefully, weight improvement in patient's dyspnea, the blood pressure will continue to improve. -Cautious use of nitro drip. -05/24/2017: Wean patient off of nitro.  Optimize blood pressure. -05/25/2017: DC losartan.  Increase the dose of hydralazine to 50 mg p.o. 3 times daily.  Increase the dose of labetalol to 600 mg p.o. twice daily.  Continue to monitor patient's blood pressure. -05/26/2017: Blood pressure is optimize.  Continue current medications.  Avoid nephrotoxins. -05/27/2017: This has resolved.  Chronic kidney disease stage IV, with possible acute kidney injury, possibly cardiorenal: -There has been mild interval improvement in patient's renal function with diuresis. -Optimize congestive heart failure management. -Avoid nephrotoxins. -Dose all medications considering renal impairment. -Nephro protective measures if cardiac catheterization is pursued. -The kidney disease is  stable. -05/25/2017: Serum creatinine has gone from 2.7-2.95.  Will discontinue IV Lasix. 05/26/2017: Serum creatinine is 3.06 today.  Torsemide is on hold.  Continue to monitor. -05/27/2017: Serum creatinine is 3.57 today.  We will continue to monitor off diuretics.  For cardiac catheterization when renal function permits.   Valvular heart disease: -Patient has severe aortic incompetence. -Patient has moderate mitral regurgitation. -Cardiology and cardiothoracic input is appreciated. -For possible valve replacement surgery.  Severe pericardial effusion: -Etiology remains uncertain. -As per cardiology, drainage can be done at the time of volvulus surgery if possible. -Continue to monitor for tamponade. -Guarded prognosis.  Acute respiratory failure with hypoxia: -This likely multifactorial (likely secondary to combined effect of above). -Low threshold to consult pulmonary team to complete pulmonary workup. -05/24/2017: This has resolved significantly.  Patient was without oxygen when I saw her. 05/27/2017: This has resolved significantly.  Patient is on supplemental oxygen, however.  There will be need to assess need for home oxygen prior to discharge.  GERD: Cont H2 blocker  Dry eyes: Cont eye drops  Allergies: Cont flonase  Chronic pain: Cont gabapentin Hold tramadol  Code Status:  Full.    DVT Prophylaxis: lovenox Family Communication:  Disposition Plan:  This will depend on hospital course.     Consultants:   Cardiology.    Cardiothoracic surgery.  Procedures:   Incomplete echo.  Antimicrobials:   None   Subjective: Shortness of breath has resolved significantly. Patient feels a lot better. No chest pain. No fever or chills.  Objective: Vitals:   05/27/17 0425 05/27/17 0800 05/27/17 0830 05/27/17 1100  BP: (!) 127/39 (!) 127/48 (!) 127/48 (!) 102/41  Pulse: 79 78 77 68  Resp: 18  16 16   Temp: 98.9 F (37.2 C)  98.5 F (  36.9 C) 98.5 F  (36.9 C)  TempSrc: Oral  Oral Oral  SpO2: 95% 96% 98% 94%  Weight: 87.5 kg (193 lb)     Height:        Intake/Output Summary (Last 24 hours) at 05/27/2017 1531 Last data filed at 05/26/2017 2100 Gross per 24 hour  Intake -  Output 250 ml  Net -250 ml   Filed Weights   05/25/17 0534 05/26/17 0351 05/27/17 0425  Weight: 87.5 kg (192 lb 12.8 oz) 87.4 kg (192 lb 9.6 oz) 87.5 kg (193 lb)    Examination:  General exam: Patient is much more comfortable today.  The patient is not in any distress.  Patient is awake, alert and oriented to time, place and person.  Patient is obese.   Respiratory system: Improved air entry. Cardiovascular system: S1 & S2, systolic and diastolic murmur.  Leg edema.   Gastrointestinal system: Abdomen is obese, soft and nontender.  Organs are difficult to assess.  Central nervous system: Alert and oriented. No focal neurological deficits. Extremities: Very minimal leg edema.  Data Reviewed: I have personally reviewed following labs and imaging studies  CBC: Recent Labs  Lab 05/22/17 1102  WBC 7.4  HGB 10.4*  HCT 32.9*  MCV 96.8  PLT 915   Basic Metabolic Panel: Recent Labs  Lab 05/23/17 0327 05/24/17 0213 05/25/17 0253 05/26/17 0246 05/27/17 0205  NA 143 141 143 142  142 141  K 3.8 3.5 3.9 3.5  3.5 4.2  CL 101 100* 100* 100*  100* 101  CO2 29 29 31 30  30 29   GLUCOSE 99 97 106* 99  98 85  BUN 32* 34* 36* 38*  38* 38*  CREATININE 2.53* 2.70* 2.95* 3.06*  3.08* 3.57*  CALCIUM 9.7 9.6 9.7 9.7  9.6 9.6  PHOS  --   --   --  3.6  --    GFR: Estimated Creatinine Clearance: 16.1 mL/min (A) (by C-G formula based on SCr of 3.57 mg/dL (H)). Liver Function Tests: Recent Labs  Lab 05/26/17 0246  ALBUMIN 2.8*   No results for input(s): LIPASE, AMYLASE in the last 168 hours. No results for input(s): AMMONIA in the last 168 hours. Coagulation Profile: No results for input(s): INR, PROTIME in the last 168 hours. Cardiac Enzymes: Recent  Labs  Lab 05/22/17 1827 05/22/17 2123 05/23/17 0327 05/23/17 0941  TROPONINI 0.03* 0.28* 0.25* 0.21*   BNP (last 3 results) Recent Labs    11/27/16 1036  PROBNP 202.0*   HbA1C: No results for input(s): HGBA1C in the last 72 hours. CBG: No results for input(s): GLUCAP in the last 168 hours. Lipid Profile: No results for input(s): CHOL, HDL, LDLCALC, TRIG, CHOLHDL, LDLDIRECT in the last 72 hours. Thyroid Function Tests: No results for input(s): TSH, T4TOTAL, FREET4, T3FREE, THYROIDAB in the last 72 hours. Anemia Panel: No results for input(s): VITAMINB12, FOLATE, FERRITIN, TIBC, IRON, RETICCTPCT in the last 72 hours. Urine analysis:    Component Value Date/Time   COLORURINE STRAW (A) 02/05/2017 2215   APPEARANCEUR CLEAR 02/05/2017 2215   LABSPEC 1.006 02/05/2017 2215   PHURINE 5.0 02/05/2017 2215   GLUCOSEU NEGATIVE 02/05/2017 2215   GLUCOSEU NEGATIVE 12/08/2015 1024   HGBUR NEGATIVE 02/05/2017 2215   BILIRUBINUR NEGATIVE 02/05/2017 2215   BILIRUBINUR neg 01/04/2016 1054   [place] 02/05/2017 2215   PROTEINUR NEGATIVE 02/05/2017 2215   UROBILINOGEN negative 01/04/2016 1054   UROBILINOGEN 0.2 12/08/2015 1024   NITRITE NEGATIVE 02/05/2017 2215   LEUKOCYTESUR  NEGATIVE 02/05/2017 2215   Sepsis Labs: @LABRCNTIP (procalcitonin:4,lacticidven:4)  )No results found for this or any previous visit (from the past 240 hour(s)).       Radiology Studies: No results found.      Scheduled Meds: . aspirin EC  81 mg Oral Daily  . calcitRIOL  0.5 mcg Oral Daily  . enoxaparin (LOVENOX) injection  30 mg Subcutaneous Q24H  . fluticasone  2 spray Each Nare Daily  . hydrALAZINE  75 mg Oral Q8H  . isosorbide mononitrate  30 mg Oral Daily  . labetalol  600 mg Oral BID  . sodium chloride flush  3 mL Intravenous Q12H   Continuous Infusions: . sodium chloride Stopped (05/24/17 1146)     LOS: 5 days    Time spent: 25 minutes.    Dana Allan, MD  [facility] Pager #: [phone] 7PM-7AM contact night coverage as above

## 2017-05-28 ENCOUNTER — Inpatient Hospital Stay (HOSPITAL_COMMUNITY): Payer: Medicare Other | Admitting: Anesthesiology

## 2017-05-28 ENCOUNTER — Other Ambulatory Visit: Payer: Self-pay | Admitting: *Deleted

## 2017-05-28 ENCOUNTER — Encounter (HOSPITAL_COMMUNITY): Payer: Self-pay | Admitting: *Deleted

## 2017-05-28 ENCOUNTER — Encounter (HOSPITAL_COMMUNITY)
Admission: EM | Disposition: A | Discharge status: Home Health | Payer: Self-pay | Source: Home / Self Care | Attending: Cardiothoracic Surgery

## 2017-05-28 ENCOUNTER — Inpatient Hospital Stay (HOSPITAL_COMMUNITY): Payer: Medicare Other

## 2017-05-28 DIAGNOSIS — I351 Nonrheumatic aortic (valve) insufficiency: Secondary | ICD-10-CM

## 2017-05-28 DIAGNOSIS — I7101 Dissection of ascending aorta: Secondary | ICD-10-CM

## 2017-05-28 DIAGNOSIS — N189 Chronic kidney disease, unspecified: Secondary | ICD-10-CM

## 2017-05-28 DIAGNOSIS — I71019 Dissection of thoracic aorta, unspecified: Secondary | ICD-10-CM

## 2017-05-28 HISTORY — PX: TEE WITHOUT CARDIOVERSION: SHX5443

## 2017-05-28 LAB — BASIC METABOLIC PANEL
Anion gap: 10 (ref 5–15)
BUN: 39 mg/dL — AB (ref 6–20)
CALCIUM: 9.9 mg/dL (ref 8.9–10.3)
CO2: 30 mmol/L (ref 22–32)
CREATININE: 3.37 mg/dL — AB (ref 0.44–1.00)
Chloride: 100 mmol/L — ABNORMAL LOW (ref 101–111)
GFR calc Af Amer: 15 mL/min — ABNORMAL LOW (ref >60)
GFR calc non Af Amer: 13 mL/min — ABNORMAL LOW (ref >60)
GLUCOSE: 98 mg/dL (ref 65–99)
Potassium: 4.4 mmol/L (ref 3.5–5.1)
Sodium: 140 mmol/L (ref 135–145)

## 2017-05-28 LAB — SURGICAL PCR SCREEN
MRSA, PCR: NEGATIVE
Staphylococcus aureus: NEGATIVE

## 2017-05-28 LAB — ABO/RH: ABO/RH(D): A POS

## 2017-05-28 SURGERY — ECHOCARDIOGRAM, TRANSESOPHAGEAL
Anesthesia: Monitor Anesthesia Care

## 2017-05-28 MED ORDER — METOPROLOL TARTRATE 5 MG/5ML IV SOLN
5.0000 mg | Freq: Once | INTRAVENOUS | Status: AC
  Administered 2017-05-28: 5 mg via INTRAVENOUS

## 2017-05-28 MED ORDER — SODIUM CHLORIDE 0.9 % IV SOLN
INTRAVENOUS | Status: DC | PRN
  Administered 2017-05-28: 11:00:00 via INTRAVENOUS

## 2017-05-28 MED ORDER — PROPOFOL 10 MG/ML IV BOLUS
INTRAVENOUS | Status: DC | PRN
  Administered 2017-05-28 (×2): 20 mg via INTRAVENOUS

## 2017-05-28 MED ORDER — METOPROLOL TARTRATE 5 MG/5ML IV SOLN
INTRAVENOUS | Status: AC
  Filled 2017-05-28: qty 5

## 2017-05-28 MED ORDER — BUTAMBEN-TETRACAINE-BENZOCAINE 2-2-14 % EX AERO
INHALATION_SPRAY | CUTANEOUS | Status: DC | PRN
  Administered 2017-05-28: 2 via TOPICAL

## 2017-05-28 MED ORDER — PROPOFOL 500 MG/50ML IV EMUL
INTRAVENOUS | Status: DC | PRN
  Administered 2017-05-28: 100 ug/kg/min via INTRAVENOUS

## 2017-05-28 MED ORDER — SODIUM CHLORIDE 0.9 % IV SOLN
INTRAVENOUS | Status: DC
  Administered 2017-05-28: 09:00:00 via INTRAVENOUS

## 2017-05-28 MED ORDER — NICARDIPINE HCL IN NACL 20-0.86 MG/200ML-% IV SOLN
3.0000 mg/h | INTRAVENOUS | Status: DC
  Filled 2017-05-28: qty 200

## 2017-05-28 NOTE — Anesthesia Procedure Notes (Signed)
Procedure Name: MAC Date/Time: 05/28/2017 11:35 AM Performed by: Jenne Campus, CRNA Pre-anesthesia Checklist: Patient identified, Emergency Drugs available, Patient being monitored, Suction available and Timeout performed Oxygen Delivery Method: Nasal cannula

## 2017-05-28 NOTE — Anesthesia Preprocedure Evaluation (Addendum)
Anesthesia Evaluation  Patient identified by MRN, date of birth, ID band Patient awake    Reviewed: Allergy & Precautions, H&P , NPO status , Patient's Chart, lab work & pertinent test results  History of Anesthesia Complications (+) PONV and history of anesthetic complications  Airway Mallampati: II  TM Distance: >3 FB Neck ROM: full    Dental  (+) Poor Dentition, Dental Advisory Given   Pulmonary former smoker,    breath sounds clear to auscultation       Cardiovascular hypertension, + Peripheral Vascular Disease and +CHF  + dysrhythmias + Valvular Problems/Murmurs AI  Rhythm:regular Rate:Normal  Severe AI   Neuro/Psych  Neuromuscular disease    GI/Hepatic   Endo/Other    Renal/GU Renal InsufficiencyRenal disease     Musculoskeletal  (+) Arthritis ,   Abdominal   Peds  Hematology   Anesthesia Other Findings   Reproductive/Obstetrics                            Anesthesia Physical Anesthesia Plan  ASA: III  Anesthesia Plan: MAC   Post-op Pain Management:    Induction: Intravenous  PONV Risk Score and Plan: 3 and Propofol infusion and Treatment may vary due to age or medical condition  Airway Management Planned: Nasal Cannula  Additional Equipment:   Intra-op Plan:   Post-operative Plan:   Informed Consent: I have reviewed the patients History and Physical, chart, labs and discussed the procedure including the risks, benefits and alternatives for the proposed anesthesia with the patient or authorized representative who has indicated his/her understanding and acceptance.     Plan Discussed with: CRNA, Anesthesiologist and Surgeon  Anesthesia Plan Comments:         Anesthesia Quick Evaluation

## 2017-05-28 NOTE — Progress Notes (Signed)
Day of Surgery Procedure(s) (LRB): TRANSESOPHAGEAL ECHOCARDIOGRAM (TEE) (N/A) Subjective: Patient examined.  Images from TEE performed today personally reviewed.  CT scan of the chest without contrast  Studies today demonstrate subacute dissection of  aortic root which has caused increase in her aortic insufficiency.  The pericardial effusion slightly larger but has not altered her hemodynamics.  Creatinine baseline 2.8 has increased to 3.4.  She is comfortable lying in bed.  No complaint of pain Pulses present in all 4.  Patient will get carotid Doppler studies tomorrow. Plan aortic valve replacement with placement of the ascending aorta-surgery scheduled for February 22.  I have discussed the operation with patient including the indications benefits and  Risks.  Objective: Vital signs in last 24 hours: Temp:  [98.2 F (36.8 C)-98.8 F (37.1 C)] 98.5 F (36.9 C) (02/20 1629) Pulse Rate:  [67-144] 71 (02/20 1530) Cardiac Rhythm: Normal sinus rhythm;Bundle branch block (02/20 0700) Resp:  [16-26] 17 (02/20 1530) BP: (120-174)/(31-65) 141/48 (02/20 1530) SpO2:  [86 %-100 %] 99 % (02/20 1530) Weight:  [192 lb (87.1 kg)-201 lb 8 oz (91.4 kg)] 193 lb 5.5 oz (87.7 kg) (02/20 1407)  Hemodynamic parameters for last 24 hours:   Stable Intake/Output from previous day: 02/19 0701 - 02/20 0700 In: 250 [P.O.:250] Out: 500 [Urine:500] Intake/Output this shift: Total I/O In: 400 [I.V.:400] Out: 160 [Urine:160]  Neuro intact Diastolic murmur abd soft Diastolic murmur    Lab Results: No results for input(s): WBC, HGB, HCT, PLT in the last 72 hours. BMET:  Recent Labs    05/27/17 0205 05/28/17 0851  NA 141 140  K 4.2 4.4  CL 101 100*  CO2 29 30  GLUCOSE 85 98  BUN 38* 39*  CREATININE 3.57* 3.37*  CALCIUM 9.6 9.9    PT/INR: No results for input(s): LABPROT, INR in the last 72 hours. ABG    Component Value Date/Time   TCO2 28 08/14/2016 0612   CBG (last 3)  No results  for input(s): GLUCAP in the last 72 hours.  Assessment/Plan: S/P Procedure(s) (LRB): TRANSESOPHAGEAL ECHOCARDIOGRAM (TEE) (N/A) DCLovenox and asa Prepare for surgery 2-22  LOS: 6 days    [name] 05/28/2017

## 2017-05-28 NOTE — Progress Notes (Signed)
Progress Note  Patient Name: [name] Date of Encounter: 05/28/2017  Primary Cardiologist:   Minus Breeding, MD   Subjective   Slight pain in her leg from her back.  No SOB  Inpatient Medications    Scheduled Meds: . aspirin EC  81 mg Oral Daily  . calcitRIOL  0.5 mcg Oral Daily  . enoxaparin (LOVENOX) injection  30 mg Subcutaneous Q24H  . fluticasone  2 spray Each Nare Daily  . hydrALAZINE  75 mg Oral Q8H  . isosorbide mononitrate  30 mg Oral Daily  . labetalol  600 mg Oral BID  . sodium chloride flush  3 mL Intravenous Q12H   Continuous Infusions: . sodium chloride Stopped (05/24/17 1146)  . [START ON 05/29/2017] sodium chloride     PRN Meds: sodium chloride, acetaminophen, albuterol, famotidine, gabapentin, hydrALAZINE, hydrocortisone, ondansetron (ZOFRAN) IV, polyvinyl alcohol, sodium chloride flush   Vital Signs    Vitals:   05/27/17 1100 05/27/17 2019 05/28/17 0055 05/28/17 0551  BP: (!) 102/41 (!) 124/44 (!) 120/48 (!) 140/54  Pulse: 68 72 67 (!) 144  Resp: 16 16 16    Temp: 98.5 F (36.9 C) 98.2 F (36.8 C) 98.3 F (36.8 C) 98.8 F (37.1 C)  TempSrc: Oral Oral Oral Oral  SpO2: 94% 95% 97% 90%  Weight:    201 lb 8 oz (91.4 kg)  Height:        Intake/Output Summary (Last 24 hours) at 05/28/2017 0834 Last data filed at 05/27/2017 2027 Gross per 24 hour  Intake 250 ml  Output 500 ml  Net -250 ml   Filed Weights   05/26/17 0351 05/27/17 0425 05/28/17 0551  Weight: 192 lb 9.6 oz (87.4 kg) 193 lb (87.5 kg) 201 lb 8 oz (91.4 kg)    Telemetry    NSR - Personally Reviewed  ECG     - Personally Reviewed  Physical Exam   GEN: No acute distress.   Neck: No  JVD Cardiac: RRR, 3/6 apical systolic murmur, positive murmurs, rubs, or gallops.  Respiratory: Clear  to auscultation bilaterally. GI: Soft, nontender, non-distended  MS: No  edema; No deformity. Neuro:  Nonfocal  Psych: Normal affect   Labs    Chemistry Recent Labs  Lab  05/25/17 0253 05/26/17 0246 05/27/17 0205  NA 143 142  142 141  K 3.9 3.5  3.5 4.2  CL 100* 100*  100* 101  CO2 31 30  30 29   GLUCOSE 106* 99  98 85  BUN 36* 38*  38* 38*  CREATININE 2.95* 3.06*  3.08* 3.57*  CALCIUM 9.7 9.7  9.6 9.6  ALBUMIN  --  2.8*  --   GFRNONAA 15* 14*  14* 12*  GFRAA 17* 17*  16* 14*  ANIONGAP 12 12  12 11      Hematology Recent Labs  Lab 05/22/17 1102  WBC 7.4  RBC 3.40*  HGB 10.4*  HCT 32.9*  MCV 96.8  MCH 30.6  MCHC 31.6  RDW 17.1*  PLT 200    Cardiac Enzymes Recent Labs  Lab 05/22/17 1827 05/22/17 2123 05/23/17 0327 05/23/17 0941  TROPONINI 0.03* 0.28* 0.25* 0.21*    Recent Labs  Lab 05/22/17 1352  TROPIPOC 0.12*     BNP Recent Labs  Lab 05/22/17 1102  BNP 800.0*     DDimer No results for input(s): DDIMER in the last 168 hours.   Radiology    No results found.  Cardiac Studies   ECHO:  Study Conclusions  -  Left ventricle: The cavity size was normal. There was severe   concentric hypertrophy. Systolic function was normal. The   estimated ejection fraction was in the range of 60% to 65%. Wall   motion was normal; there were no regional wall motion   abnormalities. Doppler parameters are consistent with abnormal   left ventricular relaxation (grade 1 diastolic dysfunction).   Doppler parameters are consistent with elevated mean left atrial   filling pressure. - Ventricular septum: Septal motion showed paradox. These changes   are consistent with a left bundle branch block. - Aortic valve: Transvalvular velocity was increased more than   expected, due to high stroke volume. There was mild stenosis.   There was severe regurgitation directed centrally in the LVOT.   Severe regurgitation is suggested by an aortic regurgitation PHT   <= 250 ms and a vena contracta >= 6 mm. Valve area (VTI): 2.05   cm^2. Valve area (Vmax): 1.82 cm^2. Valve area (Vmean): 1.97   cm^2. - Aortic root: Suspicious for aneurysm  of the right coronary sinus   of Valsalva or aneurysm of the proximal right coronary artery. - Mitral valve: There was mild to moderate regurgitation directed   eccentrically and posteriorly. - Left atrium: The atrium was mildly dilated. - Pulmonary arteries: Systolic pressure was mildly increased. PA   peak pressure: 43 mm Hg (S). - Pericardium, extracardiac: A moderate to large, free-flowing   pericardial effusion was identified circumferential to the heart.   The fluid had no internal echoes.  Patient Profile     72 y.o. female with a hx of known chronic pericardial effusion since 2018 (however progressively becoming larger), diastolic heart failure (EF 60-65% with moderate aortic insufficiency and mitral regurgitationwithgrade 1 DD (per echo 04/20/17), LBBB, CAD, chronic bilateral LE edema, HTN, and CKD stage III who is being seen for the evaluation oflarge pericardial effusion andsevereSOB.    Assessment & Plan    ACUTE ON CHRONIC DIASTOLIC HF:  Still holding diuretic today.    AI:  TEE today.    PERICARDIAL EFFUSION:  We will evaluate this today with TEE.    CKD IV:  Discussed today with primary team.  Still holding off on cath.  Creat is pending this morning.  Could consider delaying this and valve surgery if there as any reasonable chance that her kidneys will improve.      For questions or updates, please contact [place] Please consult [email or web address] for contact info under Cardiology/STEMI.   Signed, Minus Breeding, MD  05/28/2017, 8:34 AM

## 2017-05-28 NOTE — Progress Notes (Signed)
Admit: 05/22/2017 LOS: 6  89F with AoCKD4 and dCHF exacerbation with severe AI in need of eval for AV repair  Subjective:  Pt feels stable, not much dyspnea SCr improved some today from yesterday.  TEE today with large pericardial effusion and type A aortic dissection; severe AI Transferred to [facility]   02/19 0701 - 02/20 0700 In: 250 [P.O.:250] Out: 500 [Urine:500]  Filed Weights   05/27/17 0425 05/28/17 0551 05/28/17 0919  Weight: 87.5 kg (193 lb) 91.4 kg (201 lb 8 oz) 87.1 kg (192 lb)    Scheduled Meds: . aspirin EC  81 mg Oral Daily  . calcitRIOL  0.5 mcg Oral Daily  . enoxaparin (LOVENOX) injection  30 mg Subcutaneous Q24H  . fluticasone  2 spray Each Nare Daily  . hydrALAZINE  75 mg Oral Q8H  . isosorbide mononitrate  30 mg Oral Daily  . labetalol  600 mg Oral BID  . sodium chloride flush  3 mL Intravenous Q12H   Continuous Infusions: . sodium chloride Stopped (05/24/17 1146)   PRN Meds:.sodium chloride, acetaminophen, albuterol, famotidine, gabapentin, hydrALAZINE, hydrocortisone, ondansetron (ZOFRAN) IV, polyvinyl alcohol, sodium chloride flush  Current Labs: reviewed    Physical Exam:  Blood pressure (!) 161/65, pulse 81, temperature 98.3 F (36.8 C), temperature source Oral, resp. rate (!) 21, height 5\' 6"  (1.676 m), weight 87.1 kg (192 lb), SpO2 99 %. GEN: No acute distress, pleasant ENT: NCAT EYES: EOMI CV: Regular rhythm, has early diastolic murmur and mid systolic murmur, normal rate  PULM: CTA B  ABD: soft, nontender SKIN: No rashes or lesions EXT:  No edema bilaterally  A 1. CKD 4, discussed real risk of progressive renal failure related to moving forward, also acknowledging that living with dyspnea to this degree would be very difficult for her; she is processing this. 2. Type A aortic dissection with pericardial effusion and severe aortic insufficiency 3. Secondary hyperparathyroidism on calcitriol 4. Hypertension with ARB on hold; continued on  hydralazine, labetalol, isosorbide 5. CAD  P 1. Continue to hold diuretics at the current time 2. Await plan of CT Surgery 3. Pt understands and accepts risks of dialysis related to cardiac surgery 4. Daily weights, Daily Renal Panel, Strict I/Os, Avoid nephrotoxins (NSAIDs, judicious IV Contrast)    Pearson Grippe MD 05/28/2017, 2:19 PM  Recent Labs  Lab 05/26/17 0246 05/27/17 0205 05/28/17 0851  NA 142  142 141 140  K 3.5  3.5 4.2 4.4  CL 100*  100* 101 100*  CO2 30  30 29 30   GLUCOSE 99  98 85 98  BUN 38*  38* 38* 39*  CREATININE 3.06*  3.08* 3.57* 3.37*  CALCIUM 9.7  9.6 9.6 9.9  PHOS 3.6  --   --    Recent Labs  Lab 05/22/17 1102  WBC 7.4  HGB 10.4*  HCT 32.9*  MCV 96.8  PLT 200

## 2017-05-28 NOTE — Telephone Encounter (Signed)
Attempted to call pt to go over results of ONO with her but unable to reach pt.  Left message for pt to return our call x1

## 2017-05-28 NOTE — Progress Notes (Signed)
  Echocardiogram Echocardiogram Transesophageal has been performed.  Darlina Sicilian M 05/28/2017, 12:34 PM

## 2017-05-28 NOTE — Progress Notes (Addendum)
1405 Pt arrived via stretcher to room 2H22, pt A&Ox4, denies SOB, CP. No complaints. Updated pt with POC. CHG bath given and skin assessment completed. Awaiting MD to see pt. Fall precautions in place, [facility].   1430 BP still elevated despite pt given lopressor in TEE. Medicated per [facility] with IV hydralazine. New orders received from Dr. Prescott Gum. Called to clarify CTA orders. WCTM.   1445 Foley inserted with Aldona Bar, Therapist, sports. Pt tolerated well. RN spoke to pt's husband and updated with POC.   [place] called CT again to get pt to scan. Per Celeste in [place], they were busy, sending transportation now to get pt to CT.   1600 Pt down via bed with RN for CT scan.   1630 Pt back in room, 2H22.  1800 Dr. Prescott Gum at bedside. Updated with POC. Pt using incentive spirometer. Rn assisted pt to order dinner. NAD, no complaints. WCTM.

## 2017-05-28 NOTE — Transfer of Care (Signed)
Immediate Anesthesia Transfer of Care Note  Patient: [name]  Procedure(s) Performed: TRANSESOPHAGEAL ECHOCARDIOGRAM (TEE) (N/A )  Patient Location: Endoscopy Unit  Anesthesia Type:MAC  Level of Consciousness: awake, oriented and patient cooperative  Airway & Oxygen Therapy: Patient Spontanous Breathing and Patient connected to nasal cannula oxygen  Post-op Assessment: Report given to RN and Post -op Vital signs reviewed and stable  Post vital signs: Reviewed  Last Vitals:  Vitals:   05/28/17 0551 05/28/17 0919  BP: (!) 140/54 (!) 139/41  Pulse: (!) 144   Resp:    Temp: 37.1 C 36.9 C  SpO2: 90% 96%    Last Pain:  Vitals:   05/28/17 0919  TempSrc: Oral  PainSc:          Complications: No apparent anesthesia complications

## 2017-05-28 NOTE — H&P (View-Only) (Signed)
Progress Note  Patient Name: [name] Date of Encounter: 05/28/2017  Primary Cardiologist:   Minus Breeding, MD   Subjective   Slight pain in her leg from her back.  No SOB  Inpatient Medications    Scheduled Meds: . aspirin EC  81 mg Oral Daily  . calcitRIOL  0.5 mcg Oral Daily  . enoxaparin (LOVENOX) injection  30 mg Subcutaneous Q24H  . fluticasone  2 spray Each Nare Daily  . hydrALAZINE  75 mg Oral Q8H  . isosorbide mononitrate  30 mg Oral Daily  . labetalol  600 mg Oral BID  . sodium chloride flush  3 mL Intravenous Q12H   Continuous Infusions: . sodium chloride Stopped (05/24/17 1146)  . [START ON 05/29/2017] sodium chloride     PRN Meds: sodium chloride, acetaminophen, albuterol, famotidine, gabapentin, hydrALAZINE, hydrocortisone, ondansetron (ZOFRAN) IV, polyvinyl alcohol, sodium chloride flush   Vital Signs    Vitals:   05/27/17 1100 05/27/17 2019 05/28/17 0055 05/28/17 0551  BP: (!) 102/41 (!) 124/44 (!) 120/48 (!) 140/54  Pulse: 68 72 67 (!) 144  Resp: 16 16 16    Temp: 98.5 F (36.9 C) 98.2 F (36.8 C) 98.3 F (36.8 C) 98.8 F (37.1 C)  TempSrc: Oral Oral Oral Oral  SpO2: 94% 95% 97% 90%  Weight:    201 lb 8 oz (91.4 kg)  Height:        Intake/Output Summary (Last 24 hours) at 05/28/2017 0834 Last data filed at 05/27/2017 2027 Gross per 24 hour  Intake 250 ml  Output 500 ml  Net -250 ml   Filed Weights   05/26/17 0351 05/27/17 0425 05/28/17 0551  Weight: 192 lb 9.6 oz (87.4 kg) 193 lb (87.5 kg) 201 lb 8 oz (91.4 kg)    Telemetry    NSR - Personally Reviewed  ECG     - Personally Reviewed  Physical Exam   GEN: No acute distress.   Neck: No  JVD Cardiac: RRR, 3/6 apical systolic murmur, positive murmurs, rubs, or gallops.  Respiratory: Clear  to auscultation bilaterally. GI: Soft, nontender, non-distended  MS: No  edema; No deformity. Neuro:  Nonfocal  Psych: Normal affect   Labs    Chemistry Recent Labs  Lab  05/25/17 0253 05/26/17 0246 05/27/17 0205  NA 143 142  142 141  K 3.9 3.5  3.5 4.2  CL 100* 100*  100* 101  CO2 31 30  30 29   GLUCOSE 106* 99  98 85  BUN 36* 38*  38* 38*  CREATININE 2.95* 3.06*  3.08* 3.57*  CALCIUM 9.7 9.7  9.6 9.6  ALBUMIN  --  2.8*  --   GFRNONAA 15* 14*  14* 12*  GFRAA 17* 17*  16* 14*  ANIONGAP 12 12  12 11      Hematology Recent Labs  Lab 05/22/17 1102  WBC 7.4  RBC 3.40*  HGB 10.4*  HCT 32.9*  MCV 96.8  MCH 30.6  MCHC 31.6  RDW 17.1*  PLT 200    Cardiac Enzymes Recent Labs  Lab 05/22/17 1827 05/22/17 2123 05/23/17 0327 05/23/17 0941  TROPONINI 0.03* 0.28* 0.25* 0.21*    Recent Labs  Lab 05/22/17 1352  TROPIPOC 0.12*     BNP Recent Labs  Lab 05/22/17 1102  BNP 800.0*     DDimer No results for input(s): DDIMER in the last 168 hours.   Radiology    No results found.  Cardiac Studies   ECHO:  Study Conclusions  -  Left ventricle: The cavity size was normal. There was severe   concentric hypertrophy. Systolic function was normal. The   estimated ejection fraction was in the range of 60% to 65%. Wall   motion was normal; there were no regional wall motion   abnormalities. Doppler parameters are consistent with abnormal   left ventricular relaxation (grade 1 diastolic dysfunction).   Doppler parameters are consistent with elevated mean left atrial   filling pressure. - Ventricular septum: Septal motion showed paradox. These changes   are consistent with a left bundle branch block. - Aortic valve: Transvalvular velocity was increased more than   expected, due to high stroke volume. There was mild stenosis.   There was severe regurgitation directed centrally in the LVOT.   Severe regurgitation is suggested by an aortic regurgitation PHT   <= 250 ms and a vena contracta >= 6 mm. Valve area (VTI): 2.05   cm^2. Valve area (Vmax): 1.82 cm^2. Valve area (Vmean): 1.97   cm^2. - Aortic root: Suspicious for aneurysm  of the right coronary sinus   of Valsalva or aneurysm of the proximal right coronary artery. - Mitral valve: There was mild to moderate regurgitation directed   eccentrically and posteriorly. - Left atrium: The atrium was mildly dilated. - Pulmonary arteries: Systolic pressure was mildly increased. PA   peak pressure: 43 mm Hg (S). - Pericardium, extracardiac: A moderate to large, free-flowing   pericardial effusion was identified circumferential to the heart.   The fluid had no internal echoes.  Patient Profile     72 y.o. female with a hx of known chronic pericardial effusion since 2018 (however progressively becoming larger), diastolic heart failure (EF 60-65% with moderate aortic insufficiency and mitral regurgitationwithgrade 1 DD (per echo 04/20/17), LBBB, CAD, chronic bilateral LE edema, HTN, and CKD stage III who is being seen for the evaluation oflarge pericardial effusion andsevereSOB.    Assessment & Plan    ACUTE ON CHRONIC DIASTOLIC HF:  Still holding diuretic today.    AI:  TEE today.    PERICARDIAL EFFUSION:  We will evaluate this today with TEE.    CKD IV:  Discussed today with primary team.  Still holding off on cath.  Creat is pending this morning.  Could consider delaying this and valve surgery if there as any reasonable chance that her kidneys will improve.      For questions or updates, please contact [place] Please consult [email or web address] for contact info under Cardiology/STEMI.   Signed, Minus Breeding, MD  05/28/2017, 8:34 AM

## 2017-05-28 NOTE — Consult Note (Signed)
PULMONARY / CRITICAL CARE MEDICINE   Name: [name] MRN: [id number] DOB: January 28, 1946    ADMISSION DATE:  05/22/2017    CHIEF COMPLAINT:  Dyspnea with finding of type A aortic dissection, AI, and pericardial effusion on TEE 2/20    HISTORY OF PRESENT ILLNESS:   S. Pelster with dyspnea on 2/14.  She did have some dry cough but no fevers chills or sweats.  She was suffering from orthopnea sufficiently severe that because she could not complete a CT scan of the chest.  Chest x-ray was consistent with CHF and she was diuresed and she tells me that her dyspnea is substantially better and her lower extremity edema much improved.  She no longer has cough.  A TEE was  performed today showing a type A dissection with severe aortic insufficiency and a circumferential pericardial effusion.  She was transferred to ICU pending input from thoracic surgery. She denies history of any significant chest pain, she was hypertensive over the last 2 weeks, she denies use of street drugs.  He has concurrent renal insufficiency with a creatinine today of 3.3 and her diuretics are on hold.  PAST MEDICAL HISTORY :  She  has a past medical history of Acute pancreatitis (02/04/2017), Adenomatous colon polyp (01/2002), Anemia, chronic disease, Aortic valve disease, Arthritis, CHF (congestive heart failure) ([place]), CKD (chronic kidney disease), stage III ([place]), Diverticulosis of colon, DJD (degenerative joint disease), Frequent UTI, Hemorrhoids, Hypertension, Lumbar back pain, Neuropathy, Obesity, Pericardial effusion, PONV (postoperative nausea and vomiting), Pulmonary HTN ([place]), Renal cyst, Venous insufficiency, and Vitamin D deficiency.  PAST SURGICAL HISTORY: She  has a past surgical history that includes Colonoscopy w/ biopsies and polypectomy and Abdominal hysterectomy.  Allergies  Allergen Reactions  . Minoxidil Palpitations and Other (See Comments)    unable to sleep  . Naproxen Swelling    No current  facility-administered medications on file prior to encounter.    Current Outpatient Medications on File Prior to Encounter  Medication Sig  . amLODipine (NORVASC) 10 MG tablet Take 1 tablet (10 mg total) by mouth daily.  . calcitRIOL (ROCALTROL) 0.5 MCG capsule Take 0.5 mcg by mouth daily.  . calcium carbonate (TUMS - DOSED IN MG ELEMENTAL CALCIUM) 500 MG chewable tablet Chew 2 tablets by mouth daily as needed for indigestion or heartburn.  Marland Kitchen FAMOTIDINE PO Take 1 tablet by mouth 2 (two) times daily as needed (gas).  . fluticasone (FLONASE) 50 MCG/ACT nasal spray Place 2 sprays daily into both nostrils.  Marland Kitchen gabapentin (NEURONTIN) 300 MG capsule TAKE 1 CAPSULE(300 MG) BY MOUTH THREE TIMES DAILY AS NEEDED FOR PAIN  . hydrALAZINE (APRESOLINE) 25 MG tablet Take 25 mg by mouth 2 (two) times daily.  . hydrocortisone (ANUSOL-HC) 2.5 % rectal cream Place 1 application rectally as needed. (Patient taking differently: Place 1 application rectally daily as needed for hemorrhoids. )  . labetalol (NORMODYNE) 200 MG tablet TAKE 2 TABLETS BY MOUTH TWICE DAILY (Patient taking differently: 400mg  by mouth twice daily)  . losartan (COZAAR) 100 MG tablet Take 1 tablet (100 mg total) by mouth daily.  . ondansetron (ZOFRAN) 4 MG tablet Take 1 tablet (4 mg total) by mouth every 8 (eight) hours as needed for nausea or vomiting.  Marland Kitchen Propylene Glycol (SYSTANE BALANCE) 0.6 % SOLN Place 1 drop into both eyes daily as needed (for dry eyes).  Marland Kitchen spironolactone (ALDACTONE) 25 MG tablet Take 2 tablets (50 mg total) daily by mouth.  . torsemide (DEMADEX) 20 MG tablet TAKE 2  TABLETS BY MOUTH EVERY MORNING (Patient taking differently: 40mg  by mouth every evening)  . traMADol (ULTRAM) 50 MG tablet Take 1 tablet (50 mg total) by mouth 3 (three) times daily as needed for severe pain.  Marland Kitchen triamcinolone ointment (KENALOG) 0.5 % Apply 1 application topically 2 (two) times daily. (Patient taking differently: Apply 1 application topically daily  as needed (itching). )  . pantoprazole (PROTONIX) 40 MG tablet Take 1 tablet (40 mg total) daily by mouth. (Patient not taking: Reported on 05/22/2017)  . PROCRIT 20254 UNIT/ML injection Inject 40,000 Units into the skin every 14 (fourteen) days.    FAMILY HISTORY:  Her indicated that her mother is alive. She indicated that her father is deceased. She indicated that only one of her two sisters is alive. She indicated that only one of her two brothers is alive. She indicated that her maternal grandmother is deceased. She indicated that her maternal grandfather is deceased. She indicated that her paternal grandmother is deceased. She indicated that her paternal grandfather is deceased. She indicated that the status of her neg hx is unknown.   SOCIAL HISTORY: She  reports that she quit smoking about 35 years ago. Her smoking use included cigarettes. She has a 6.00 pack-year smoking history. she has never used smokeless tobacco. She reports that she does not drink alcohol or use drugs.  REVIEW OF SYSTEMS:   10 system review of systems was really not remarkable except that the patient has severe GERD with so much abdominal discomfort that she was suspected of having pancreatitis.  She does not have a history of diabetes.  She has no history of any sort of neurological deficit.    SUBJECTIVE:  As above  VITAL SIGNS: BP (!) 141/48   Pulse 71   Temp 98.3 F (36.8 C) (Oral)   Resp 17   Ht 5\' 6"  (1.676 m)   Wt 193 lb 5.5 oz (87.7 kg)   SpO2 99%   BMI 31.21 kg/m   HEMODYNAMICS:    VENTILATOR SETTINGS:    INTAKE / OUTPUT: I/O last 3 completed shifts: In: 250 [P.O.:250] Out: 750 [Urine:750]  PHYSICAL EXAMINATION: General: This really is an extremely pleasant woman who is in no overt distress. Neuro: She is alert and oriented x3 the face is symmetric and she moves all fours on request. HEENT:   Cardiovascular: She has 3+ symmetric radial pulses and dorsalis pedis pulses.  She does not  have JVD, she does have 1+ lower extremity edema.  S1 and S2 are regular with a 3 out of 6 systolic ejection murmur and a 2 out of 6 diastolic murmur at the apex.   Lungs: Respirations are unlabored, there is symmetric air movement, no wheezes. Abdomen: The abdomen is somewhat obese soft and nontender   LABS:  BMET Recent Labs  Lab 05/26/17 0246 05/27/17 0205 05/28/17 0851  NA 142  142 141 140  K 3.5  3.5 4.2 4.4  CL 100*  100* 101 100*  CO2 30  30 29 30   BUN 38*  38* 38* 39*  CREATININE 3.06*  3.08* 3.57* 3.37*  GLUCOSE 99  98 85 98    Electrolytes Recent Labs  Lab 05/26/17 0246 05/27/17 0205 05/28/17 0851  CALCIUM 9.7  9.6 9.6 9.9  PHOS 3.6  --   --     CBC Recent Labs  Lab 05/22/17 1102  WBC 7.4  HGB 10.4*  HCT 32.9*  PLT 200    Coag's No results for input(s):  APTT, INR in the last 168 hours.  Sepsis Markers No results for input(s): LATICACIDVEN, PROCALCITON, O2SATVEN in the last 168 hours.  ABG No results for input(s): PHART, PCO2ART, PO2ART in the last 168 hours.  Liver Enzymes Recent Labs  Lab 05/26/17 0246  ALBUMIN 2.8*    Cardiac Enzymes Recent Labs  Lab 05/22/17 2123 05/23/17 0327 05/23/17 0941  TROPONINI 0.28* 0.25* 0.21*    Glucose No results for input(s): GLUCAP in the last 168 hours.  Imaging No results found.    DISCUSSION:    Dyspnea and CHF and on TEE today the apparent provocation is a type a dissection with aortic insufficiency.  She also has a circumferential pericardial effusion.  Concurrently she has renal insufficiency.  ASSESSMENT / PLAN:  PULMONARY A: She is no longer suffering from dyspnea, and her diuretics on hold out of respect for her rising creatinine.   CARDIOVASCULAR A: Type a dissection wound with aortic insufficiency and a circumferential pericardial effusion.  She is already beta blocked and I have ordered a as needed Cardene infusion to avoid extreme hypertension.  She has no neurological  deficit at present.   RENAL A: Diuretics currently on hold due to renal insufficiency as noted  GASTROINTESTINAL A:   Prophylaxis is with Pepcid   Lars Masson, MD Pulmonary and [place] Pager: [phone]  05/28/2017, 3:56 PM

## 2017-05-28 NOTE — CV Procedure (Signed)
Brief TEE Note  LVEF 60-65% Large circumferential pericardial effusion Type A dissection of the aorta extending from the right coronary cusp to the descending aorta There appears to be thrombus in the effusion Severe aortic regurgitation Mild mitral regurgitation No LA/LAA thrombus or mass  Findings reported to CT Surgery and the primary cardiologist will be notified.    For additional details see full report.   [name] C. Martha Linsey, MD, [facility] 05/28/2017 12:16 PM

## 2017-05-28 NOTE — Progress Notes (Signed)
BP 174/43. Call placed to Dr. Oval Linsey advised of this, verbal order received for metoprolol 5mg  IV x1 dose now. See MAR for administration. Patient transferred to 2H22, BP 164/42 on transfer.

## 2017-05-28 NOTE — Interval H&P Note (Signed)
History and Physical Interval Note:  05/28/2017 10:19 AM  [name]  has presented today for surgery, with the diagnosis of SEVERE AORTIC REGURGITATION  The various methods of treatment have been discussed with the patient and family. After consideration of risks, benefits and other options for treatment, the patient has consented to  Procedure(s): TRANSESOPHAGEAL ECHOCARDIOGRAM (TEE) (N/A) as a surgical intervention .  The patient's history has been reviewed, patient examined, no change in status, stable for surgery.  I have reviewed the patient's chart and labs.  Questions were answered to the patient's satisfaction.     Skeet Latch, MD

## 2017-05-28 NOTE — Progress Notes (Addendum)
PROGRESS NOTE    [name]  ZSW:[id number] DOB: [birth year] DOA: 05/22/2017 PCP: Hoyt Koch, MD  Outpatient Specialists:   Brief Narrative: Patient is a 72 year old African-American female, obese, with past medical history significant for chronic kidney disease stage IV, chronic diastolic congestive heart failure, hypertension, chronic pericardial effusion, moderate to severe aortic incompetence, mild to moderate mitral regurgitation, and pulmonary hypertension who presented with worsening dyspnea, orthopnea and leg edema which got severe when she went for an outpatient echo and CT of the lung day prior to admission.  Apparently, the patient has been following up with the pulmonologist who is trying to rule out possible pulmonary disease.  The patient is also known to the cardiology team.  The patient was admitted with acute on chronic diastolic congestive heart failure, severe pericardial effusion, accelerated hypertension, and mildly elevated troponin that could easily be type II elevation.  Cardiology and cardiothoracic surgery were consulted.  TEE planned for 2/20.  Cardiac catheterization followed by valve replacement surgery and drainage of pericardial fluid at same time pending improvement and stabilization of renal insufficiency.  Nephrology following.    Assessment & Plan:   Principal Problem:   Diastolic congestive heart failure (HCC) Active Problems:   Essential hypertension   CKD (chronic kidney disease) stage 4, GFR 15-29 ml/min (HCC)   Pericardial effusion   Nonrheumatic aortic valve insufficiency   Hypertensive emergency    Acute on chronic diastolic congestive heart failure: - Initially treated with IV Lasix and oral Aldactone.  Renal insufficiency got worse.  Diuretics are on hold.  Volume status is improved.  Cardiology following. -Patient was also on nitroglycerin drip which has been discontinued. -TTE 2/15: LVEF 32-20%, grade 1 diastolic dysfunction mild AS,  severe AI, moderate to severe MR and moderate to large free-flowing pericardial effusion. -TEE 2/20: LVEF 60-65%, large circumferential pericardial effusion, type a dissection of aorta extending from the right coronary cusp to the descending aorta, appears to be a thrombus in the pericardial effusion, severe AI, mild MR.  Findings of TEE were discussed by the performing cardiologist with CT surgery and primary cardiologist.  Aortic dissection -Noted on TEE 2/20.  I discussed with Dr. Percival Spanish.  This would explain patient's multiple presentation including worsening AI, worsening pericardial effusion and renal insufficiency.  Thoracic surgery has been consulted by cardiology.  Patient being transferred to ICU for close monitoring and management.  Possible surgery in the next day or 2.  Will likely end up on dialysis.  Hypertensive urgency: -Patient has been weaned off nitroglycerin drip several days ago.  Losartan discontinued due to worsening renal insufficiency.  Hydralazine increased to 50 mg 3 times daily.  Labetalol increased to 600 mg twice daily.  Also remains on Imdur 30 mg daily. -Improved and controlled.  Acute on chronic kidney disease stage IV -May have worsened secondary to aggressive diuresis complicating cardiorenal syndrome. -Diuretics being held.  ARB discontinued. -Patient aware of real risks of landing up on HD when she undergoes cardiac catheterization and valve surgery. -I discussed with Dr. Percival Spanish, cardiac cath can be delayed for a week or 2 pending improvement in renal insufficiency.  I discussed with Dr. Joelyn Oms, Nephrology who agrees with this but patient may still land up on dialysis.  She will need to be on some diuretics if discharged home prior to cath or surgery. -Creatinine has slightly improved compared to yesterday.  Continue to monitor BMP.   Valvular heart disease: -Patient has severe aortic incompetence. -Patient has moderate mitral regurgitation. -  Cardiology  and cardiothoracic input is appreciated. -For possible valve replacement surgery pending improvement in renal insufficiency.  Severe pericardial effusion: -Etiology remains uncertain. -As per cardiology, drainage can be done at the time of volvulus surgery if possible. -Continue to monitor for tamponade.  No clinical tamponade. -Guarded prognosis.  Acute respiratory failure with hypoxia: -This likely multifactorial (likely secondary to combined effect of above). -Currently saturating in the high 90s on 2 L/min nasal cannula oxygen.  GERD: Cont H2 blocker  Dry eyes: Cont eye drops  Allergies: Cont flonase  Chronic pain: Cont gabapentin Hold tramadol  Code Status:  Full.    DVT Prophylaxis: lovenox Family Communication:  None at bedside. Disposition Plan:  This will depend on hospital course.     Consultants:   Cardiology.    Cardiothoracic surgery.  Nephrology.  Procedures:   TTE  TEE 2/20  Antimicrobials:   None   Subjective: Overall feels much better compared to admission.  Denies dyspnea or chest pain.  Objective: Vitals:   05/28/17 0551 05/28/17 0919 05/28/17 1220 05/28/17 1235  BP: (!) 140/54 (!) 139/41 (!) 126/31 (!) 134/35  Pulse: (!) 144  71 69  Resp:   17 19  Temp: 98.8 F (37.1 C) 98.5 F (36.9 C) 98.3 F (36.8 C)   TempSrc: Oral Oral Oral   SpO2: 90% 96% 94% 97%  Weight: 91.4 kg (201 lb 8 oz) 87.1 kg (192 lb)    Height:  5\' 6"  (1.676 m)      Intake/Output Summary (Last 24 hours) at 05/28/2017 1241 Last data filed at 05/28/2017 1213 Gross per 24 hour  Intake 650 ml  Output 500 ml  Net 150 ml   Filed Weights   05/27/17 0425 05/28/17 0551 05/28/17 0919  Weight: 87.5 kg (193 lb) 91.4 kg (201 lb 8 oz) 87.1 kg (192 lb)    Examination:  General exam: Pleasant elderly female, moderately built and nourished, lying comfortably supine in bed. Respiratory system: Occasional basal crackles but otherwise clear to auscultation.  No  increased work of breathing. Cardiovascular system: S1 and S2 heard, RRR.  Grade 3 x 6 systolic and diastolic murmur best heard at apex.  No JVD.  Trace bilateral ankle edema.  Telemetry personally reviewed: Sinus rhythm with BBB morphology. Gastrointestinal system: Abdomen is nondistended, soft and nontender.  Normal bowel sounds heard. Central nervous system: Alert and oriented. No focal neurological deficits.  Stable without change. Extremities: Trace ankle edema.  Moves all extremities symmetrically and well.  Data Reviewed: I have personally reviewed following labs and imaging studies  CBC: Recent Labs  Lab 05/22/17 1102  WBC 7.4  HGB 10.4*  HCT 32.9*  MCV 96.8  PLT 989   Basic Metabolic Panel: Recent Labs  Lab 05/24/17 0213 05/25/17 0253 05/26/17 0246 05/27/17 0205 05/28/17 0851  NA 141 143 142  142 141 140  K 3.5 3.9 3.5  3.5 4.2 4.4  CL 100* 100* 100*  100* 101 100*  CO2 29 31 30  30 29 30   GLUCOSE 97 106* 99  98 85 98  BUN 34* 36* 38*  38* 38* 39*  CREATININE 2.70* 2.95* 3.06*  3.08* 3.57* 3.37*  CALCIUM 9.6 9.7 9.7  9.6 9.6 9.9  PHOS  --   --  3.6  --   --    GFR: Estimated Creatinine Clearance: 17 mL/min (A) (by C-G formula based on SCr of 3.37 mg/dL (H)). Liver Function Tests: Recent Labs  Lab 05/26/17 0246  ALBUMIN 2.8*  Cardiac Enzymes: Recent Labs  Lab 05/22/17 1827 05/22/17 2123 05/23/17 0327 05/23/17 0941  TROPONINI 0.03* 0.28* 0.25* 0.21*   BNP (last 3 results) Recent Labs    11/27/16 1036  PROBNP 202.0*    Radiology Studies: No results found.      Scheduled Meds: . [MAR Hold] aspirin EC  81 mg Oral Daily  . [MAR Hold] calcitRIOL  0.5 mcg Oral Daily  . [MAR Hold] enoxaparin (LOVENOX) injection  30 mg Subcutaneous Q24H  . [MAR Hold] fluticasone  2 spray Each Nare Daily  . [MAR Hold] hydrALAZINE  75 mg Oral Q8H  . [MAR Hold] isosorbide mononitrate  30 mg Oral Daily  . [MAR Hold] labetalol  600 mg Oral BID  . [MAR  Hold] sodium chloride flush  3 mL Intravenous Q12H   Continuous Infusions: . [MAR Hold] sodium chloride Stopped (05/24/17 1146)  . [START ON 05/29/2017] sodium chloride Stopped (05/28/17 1237)     LOS: 6 days    Time spent: 25 minutes.  Vernell Leep, MD, FACP, [facility]. [facility] Pager [phone]  If 7PM-7AM, please contact night-coverage [email or web address] Password [email or web address] 05/28/2017, 1:04 PM

## 2017-05-29 ENCOUNTER — Telehealth: Payer: Self-pay | Admitting: Internal Medicine

## 2017-05-29 ENCOUNTER — Inpatient Hospital Stay (HOSPITAL_COMMUNITY): Payer: Medicare Other

## 2017-05-29 DIAGNOSIS — I7103 Dissection of thoracoabdominal aorta: Secondary | ICD-10-CM

## 2017-05-29 DIAGNOSIS — Z01810 Encounter for preprocedural cardiovascular examination: Secondary | ICD-10-CM

## 2017-05-29 LAB — URINALYSIS, ROUTINE W REFLEX MICROSCOPIC
Bilirubin Urine: NEGATIVE
Glucose, UA: NEGATIVE mg/dL
Ketones, ur: NEGATIVE mg/dL
Nitrite: NEGATIVE
Protein, ur: NEGATIVE mg/dL
Specific Gravity, Urine: 1.013 (ref 1.005–1.030)
pH: 5 (ref 5.0–8.0)

## 2017-05-29 LAB — CBC
HCT: 27.5 % — ABNORMAL LOW (ref 36.0–46.0)
HEMOGLOBIN: 8.6 g/dL — AB (ref 12.0–15.0)
MCH: 31 pg (ref 26.0–34.0)
MCHC: 31.3 g/dL (ref 30.0–36.0)
MCV: 99.3 fL (ref 78.0–100.0)
Platelets: 175 10*3/uL (ref 150–400)
RBC: 2.77 MIL/uL — AB (ref 3.87–5.11)
RDW: 17.2 % — ABNORMAL HIGH (ref 11.5–15.5)
WBC: 5.4 10*3/uL (ref 4.0–10.5)

## 2017-05-29 LAB — PULMONARY FUNCTION TEST
FEF 25-75 Post: 0.58 L/sec
FEF 25-75 Pre: 0.52 L/sec
FEF2575-%Change-Post: 12 %
FEF2575-%Pred-Post: 32 %
FEF2575-%Pred-Pre: 29 %
FEV1-%Change-Post: 4 %
FEV1-%Pred-Post: 28 %
FEV1-%Pred-Pre: 27 %
FEV1-Post: 0.57 L
FEV1-Pre: 0.54 L
FEV1FVC-%Change-Post: -2 %
FEV1FVC-%Pred-Pre: 108 %
FEV6-%Change-Post: 6 %
FEV6-%Pred-Post: 28 %
FEV6-%Pred-Pre: 26 %
FEV6-Post: 0.7 L
FEV6-Pre: 0.65 L
FEV6FVC-%Pred-Post: 104 %
FEV6FVC-%Pred-Pre: 104 %
FVC-%Change-Post: 6 %
FVC-%Pred-Post: 27 %
FVC-%Pred-Pre: 25 %
FVC-Post: 0.7 L
FVC-Pre: 0.65 L
Post FEV1/FVC ratio: 81 %
Post FEV6/FVC ratio: 100 %
Pre FEV1/FVC ratio: 83 %
Pre FEV6/FVC Ratio: 100 %

## 2017-05-29 LAB — COMPREHENSIVE METABOLIC PANEL
ALT: 36 U/L (ref 14–54)
AST: 46 U/L — ABNORMAL HIGH (ref 15–41)
Albumin: 2.7 g/dL — ABNORMAL LOW (ref 3.5–5.0)
Alkaline Phosphatase: 52 U/L (ref 38–126)
Anion gap: 10 (ref 5–15)
BUN: 42 mg/dL — ABNORMAL HIGH (ref 6–20)
CO2: 29 mmol/L (ref 22–32)
Calcium: 9.7 mg/dL (ref 8.9–10.3)
Chloride: 99 mmol/L — ABNORMAL LOW (ref 101–111)
Creatinine, Ser: 3.26 mg/dL — ABNORMAL HIGH (ref 0.44–1.00)
GFR calc Af Amer: 15 mL/min — ABNORMAL LOW (ref >60)
GFR calc non Af Amer: 13 mL/min — ABNORMAL LOW (ref >60)
Glucose, Bld: 96 mg/dL (ref 65–99)
Potassium: 4.6 mmol/L (ref 3.5–5.1)
Sodium: 138 mmol/L (ref 135–145)
Total Bilirubin: 0.5 mg/dL (ref 0.3–1.2)
Total Protein: 5.9 g/dL — ABNORMAL LOW (ref 6.5–8.1)

## 2017-05-29 LAB — PREPARE RBC (CROSSMATCH)

## 2017-05-29 LAB — MAGNESIUM: Magnesium: 2.1 mg/dL (ref 1.7–2.4)

## 2017-05-29 LAB — PROTIME-INR
INR: 1.09
Prothrombin Time: 14 seconds (ref 11.4–15.2)

## 2017-05-29 LAB — PHOSPHORUS: PHOSPHORUS: 4.8 mg/dL — AB (ref 2.5–4.6)

## 2017-05-29 LAB — TSH: TSH: 0.703 u[IU]/mL (ref 0.350–4.500)

## 2017-05-29 MED ORDER — SODIUM CHLORIDE 0.9 % IV SOLN
INTRAVENOUS | Status: AC
  Administered 2017-05-30: .8 [IU]/h via INTRAVENOUS
  Filled 2017-05-29: qty 1

## 2017-05-29 MED ORDER — DEXMEDETOMIDINE HCL IN NACL 400 MCG/100ML IV SOLN
0.1000 ug/kg/h | INTRAVENOUS | Status: AC
  Administered 2017-05-30: .5 ug/kg/h via INTRAVENOUS
  Filled 2017-05-29: qty 100

## 2017-05-29 MED ORDER — SODIUM CHLORIDE 0.9 % IV SOLN
INTRAVENOUS | Status: DC
  Filled 2017-05-29: qty 30

## 2017-05-29 MED ORDER — POTASSIUM CHLORIDE 2 MEQ/ML IV SOLN
80.0000 meq | INTRAVENOUS | Status: DC
  Filled 2017-05-29: qty 40

## 2017-05-29 MED ORDER — LABETALOL HCL 300 MG PO TABS: 600.0000 mg | ORAL_TABLET | Freq: Two times a day (BID) | ORAL | Status: DC

## 2017-05-29 MED ORDER — CHLORHEXIDINE GLUCONATE 0.12 % MT SOLN
15.0000 mL | Freq: Once | OROMUCOSAL | Status: AC
  Administered 2017-05-30: 15 mL via OROMUCOSAL
  Filled 2017-05-29: qty 15

## 2017-05-29 MED ORDER — METOPROLOL TARTRATE 12.5 MG HALF TABLET
12.5000 mg | ORAL_TABLET | Freq: Once | ORAL | Status: AC
  Administered 2017-05-30: 12.5 mg via ORAL
  Filled 2017-05-29: qty 1

## 2017-05-29 MED ORDER — DOPAMINE-DEXTROSE 3.2-5 MG/ML-% IV SOLN
0.0000 ug/kg/min | INTRAVENOUS | Status: DC
  Filled 2017-05-29: qty 250

## 2017-05-29 MED ORDER — SODIUM CHLORIDE 0.9 % IV SOLN
750.0000 mg | INTRAVENOUS | Status: DC
  Filled 2017-05-29: qty 750

## 2017-05-29 MED ORDER — MILRINONE LACTATE IN DEXTROSE 20-5 MG/100ML-% IV SOLN
0.1250 ug/kg/min | INTRAVENOUS | Status: AC
  Administered 2017-05-30: 0.25 ug/kg/min via INTRAVENOUS
  Filled 2017-05-29: qty 100

## 2017-05-29 MED ORDER — MAGNESIUM SULFATE 50 % IJ SOLN
40.0000 meq | INTRAMUSCULAR | Status: DC
  Filled 2017-05-29: qty 9.85

## 2017-05-29 MED ORDER — NITROGLYCERIN IN D5W 200-5 MCG/ML-% IV SOLN
2.0000 ug/min | INTRAVENOUS | Status: DC
  Filled 2017-05-29: qty 250

## 2017-05-29 MED ORDER — PLASMA-LYTE 148 IV SOLN
INTRAVENOUS | Status: AC
  Administered 2017-05-30: 500 mL
  Filled 2017-05-29: qty 2.5

## 2017-05-29 MED ORDER — TEMAZEPAM 15 MG PO CAPS
15.0000 mg | ORAL_CAPSULE | Freq: Once | ORAL | Status: DC | PRN
Start: 2017-05-29 — End: 2017-05-30

## 2017-05-29 MED ORDER — SODIUM CHLORIDE 0.9 % IV SOLN
1.5000 g | INTRAVENOUS | Status: AC
  Administered 2017-05-30: 1.5 g via INTRAVENOUS
  Filled 2017-05-29: qty 1.5

## 2017-05-29 MED ORDER — BISACODYL 5 MG PO TBEC
5.0000 mg | DELAYED_RELEASE_TABLET | Freq: Once | ORAL | Status: AC
  Administered 2017-05-29: 5 mg via ORAL
  Filled 2017-05-29: qty 1

## 2017-05-29 MED ORDER — PHENYLEPHRINE HCL 10 MG/ML IJ SOLN
30.0000 ug/min | INTRAMUSCULAR | Status: AC
  Administered 2017-05-30: 10 ug/min via INTRAVENOUS
  Filled 2017-05-29: qty 20

## 2017-05-29 MED ORDER — CHLORHEXIDINE GLUCONATE 4 % EX LIQD
60.0000 mL | Freq: Once | CUTANEOUS | Status: AC
  Administered 2017-05-30: 4 via TOPICAL
  Filled 2017-05-29: qty 60

## 2017-05-29 MED ORDER — CHLORHEXIDINE GLUCONATE 4 % EX LIQD
60.0000 mL | Freq: Once | CUTANEOUS | Status: AC
  Administered 2017-05-29: 4 via TOPICAL
  Filled 2017-05-29: qty 60

## 2017-05-29 MED ORDER — EPINEPHRINE PF 1 MG/ML IJ SOLN
0.0000 ug/min | INTRAMUSCULAR | Status: AC
  Administered 2017-05-30: 3 ug/min via INTRAVENOUS
  Filled 2017-05-29: qty 4

## 2017-05-29 MED ORDER — DIAZEPAM 2 MG PO TABS
2.0000 mg | ORAL_TABLET | Freq: Once | ORAL | Status: AC
  Administered 2017-05-30: 2 mg via ORAL
  Filled 2017-05-29: qty 1

## 2017-05-29 MED ORDER — VANCOMYCIN HCL 10 G IV SOLR
1500.0000 mg | INTRAVENOUS | Status: AC
  Administered 2017-05-30: 1500 mg via INTRAVENOUS
  Filled 2017-05-29: qty 1500

## 2017-05-29 MED ORDER — TRANEXAMIC ACID (OHS) PUMP PRIME SOLUTION
2.0000 mg/kg | INTRAVENOUS | Status: DC
  Filled 2017-05-29: qty 1.84

## 2017-05-29 MED ORDER — TRANEXAMIC ACID 1000 MG/10ML IV SOLN
1.5000 mg/kg/h | INTRAVENOUS | Status: AC
  Administered 2017-05-30: 1.5 mg/kg/h via INTRAVENOUS
  Filled 2017-05-29: qty 25

## 2017-05-29 MED ORDER — TRANEXAMIC ACID (OHS) BOLUS VIA INFUSION
15.0000 mg/kg | INTRAVENOUS | Status: AC
  Administered 2017-05-30: 1381.5 mg via INTRAVENOUS
  Filled 2017-05-29: qty 1382

## 2017-05-29 NOTE — Progress Notes (Addendum)
PULMONARY / CRITICAL CARE MEDICINE   Name: [name] MRN: [id number] DOB: [birth year]    ADMISSION DATE:  05/22/2017    CHIEF COMPLAINT:  Dyspnea with finding of type A aortic dissection, AI, and pericardial effusion on TEE 2/20    HISTORY OF PRESENT ILLNESS:   [name] with dyspnea on 2/14.  She did have some dry cough but no fevers chills or sweats.  She was suffering from orthopnea sufficiently severe that because she could not complete a CT scan of the chest.  Chest x-ray was consistent with CHF and she was diuresed and she tells me that her dyspnea is substantially better and her lower extremity edema much improved.  She no longer has cough.  A TEE was  performed today showing a type A dissection with severe aortic insufficiency and a circumferential pericardial effusion.  She was transferred to ICU pending input from thoracic surgery. She denies history of any significant chest pain, she was hypertensive over the last 2 weeks, she denies use of street drugs.  He has concurrent renal insufficiency with a creatinine today of 3.3 and her diuretics are on hold.  PAST MEDICAL HISTORY :  She  has a past medical history of Acute pancreatitis (02/04/2017), Adenomatous colon polyp (01/2002), Anemia, chronic disease, Aortic valve disease, Arthritis, CHF (congestive heart failure) ([place]), CKD (chronic kidney disease), stage III ([place]), Diverticulosis of colon, DJD (degenerative joint disease), Frequent UTI, Hemorrhoids, Hypertension, Lumbar back pain, Neuropathy, Obesity, Pericardial effusion, PONV (postoperative nausea and vomiting), Pulmonary HTN ([place]), Renal cyst, Venous insufficiency, and Vitamin D deficiency.  PAST SURGICAL HISTORY: She  has a past surgical history that includes Colonoscopy w/ biopsies and polypectomy; Abdominal hysterectomy; and TEE without cardioversion (N/A, 05/28/2017).  Allergies  Allergen Reactions  . Minoxidil Palpitations and Other (See Comments)    unable to sleep  .  Naproxen Swelling    No current facility-administered medications on file prior to encounter.    Current Outpatient Medications on File Prior to Encounter  Medication Sig  . amLODipine (NORVASC) 10 MG tablet Take 1 tablet (10 mg total) by mouth daily.  . calcitRIOL (ROCALTROL) 0.5 MCG capsule Take 0.5 mcg by mouth daily.  . calcium carbonate (TUMS - DOSED IN MG ELEMENTAL CALCIUM) 500 MG chewable tablet Chew 2 tablets by mouth daily as needed for indigestion or heartburn.  Marland Kitchen FAMOTIDINE PO Take 1 tablet by mouth 2 (two) times daily as needed (gas).  . fluticasone (FLONASE) 50 MCG/ACT nasal spray Place 2 sprays daily into both nostrils.  Marland Kitchen gabapentin (NEURONTIN) 300 MG capsule TAKE 1 CAPSULE(300 MG) BY MOUTH THREE TIMES DAILY AS NEEDED FOR PAIN  . hydrALAZINE (APRESOLINE) 25 MG tablet Take 25 mg by mouth 2 (two) times daily.  . hydrocortisone (ANUSOL-HC) 2.5 % rectal cream Place 1 application rectally as needed. (Patient taking differently: Place 1 application rectally daily as needed for hemorrhoids. )  . labetalol (NORMODYNE) 200 MG tablet TAKE 2 TABLETS BY MOUTH TWICE DAILY (Patient taking differently: 400mg  by mouth twice daily)  . losartan (COZAAR) 100 MG tablet Take 1 tablet (100 mg total) by mouth daily.  . ondansetron (ZOFRAN) 4 MG tablet Take 1 tablet (4 mg total) by mouth every 8 (eight) hours as needed for nausea or vomiting.  Marland Kitchen Propylene Glycol (SYSTANE BALANCE) 0.6 % SOLN Place 1 drop into both eyes daily as needed (for dry eyes).  Marland Kitchen spironolactone (ALDACTONE) 25 MG tablet Take 2 tablets (50 mg total) daily by mouth.  . torsemide (DEMADEX)  20 MG tablet TAKE 2 TABLETS BY MOUTH EVERY MORNING (Patient taking differently: 40mg  by mouth every evening)  . traMADol (ULTRAM) 50 MG tablet Take 1 tablet (50 mg total) by mouth 3 (three) times daily as needed for severe pain.  Marland Kitchen triamcinolone ointment (KENALOG) 0.5 % Apply 1 application topically 2 (two) times daily. (Patient taking differently:  Apply 1 application topically daily as needed (itching). )  . pantoprazole (PROTONIX) 40 MG tablet Take 1 tablet (40 mg total) daily by mouth. (Patient not taking: Reported on 05/22/2017)  . PROCRIT 46568 UNIT/ML injection Inject 40,000 Units into the skin every 14 (fourteen) days.    FAMILY HISTORY:  Her indicated that her mother is alive. She indicated that her father is deceased. She indicated that only one of her two sisters is alive. She indicated that only one of her two brothers is alive. She indicated that her maternal grandmother is deceased. She indicated that her maternal grandfather is deceased. She indicated that her paternal grandmother is deceased. She indicated that her paternal grandfather is deceased. She indicated that the status of her neg hx is unknown.   SOCIAL HISTORY: She  reports that she quit smoking about 35 years ago. Her smoking use included cigarettes. She has a 6.00 pack-year smoking history. she has never used smokeless tobacco. She reports that she does not drink alcohol or use drugs.  REVIEW OF SYSTEMS:   10 system review of systems was really not remarkable except that the patient has severe GERD with so much abdominal discomfort that she was suspected of having pancreatitis.  She does not have a history of diabetes.  She has no history of any sort of neurological deficit.    SUBJECTIVE:  No acute events overnight.  Seen by cardiothoracic surgery Plan to take to the OR on 2/22 Denies any dyspnea, chest pain.  VITAL SIGNS: BP (!) 137/42   Pulse (!) 59   Temp 97.7 F (36.5 C) (Oral)   Resp 15   Ht 5\' 6"  (1.676 m)   Wt 203 lb 0.7 oz (92.1 kg)   SpO2 100%   BMI 32.77 kg/m   HEMODYNAMICS:    VENTILATOR SETTINGS:    INTAKE / OUTPUT: I/O last 3 completed shifts: In: 1097 [P.O.:472; I.V.:400; Other:225] Out: 660 [Urine:660]  PHYSICAL EXAMINATION: Gen:      No acute distress HEENT:  EOMI, sclera anicteric Neck:     No masses; no  thyromegaly Lungs:    Scattered crackles; normal respiratory effort CV:         Regular rate and rhythm; no murmurs Abd:      + bowel sounds; soft, non-tender; no palpable masses, no distension Ext:    No edema; adequate peripheral perfusion Skin:      Warm and dry; no rash Neuro: alert and oriented x 3t  LABS:  BMET Recent Labs  Lab 05/27/17 0205 05/28/17 0851 05/29/17 0353  NA 141 140 138  K 4.2 4.4 4.6  CL 101 100* 99*  CO2 29 30 29   BUN 38* 39* 42*  CREATININE 3.57* 3.37* 3.26*  GLUCOSE 85 98 96    Electrolytes Recent Labs  Lab 05/26/17 0246 05/27/17 0205 05/28/17 0851 05/29/17 0353  CALCIUM 9.7  9.6 9.6 9.9 9.7  MG  --   --   --  2.1  PHOS 3.6  --   --  4.8*    CBC Recent Labs  Lab 05/22/17 1102 05/29/17 0353  WBC 7.4 5.4  HGB 10.4*  8.6*  HCT 32.9* 27.5*  PLT 200 175    Coag's Recent Labs  Lab 05/29/17 0353  INR 1.09    Sepsis Markers No results for input(s): LATICACIDVEN, PROCALCITON, O2SATVEN in the last 168 hours.  ABG No results for input(s): PHART, PCO2ART, PO2ART in the last 168 hours.  Liver Enzymes Recent Labs  Lab 05/26/17 0246 05/29/17 0353  AST  --  46*  ALT  --  36  ALKPHOS  --  52  BILITOT  --  0.5  ALBUMIN 2.8* 2.7*    Cardiac Enzymes Recent Labs  Lab 05/22/17 2123 05/23/17 0327 05/23/17 0941  TROPONINI 0.28* 0.25* 0.21*    Glucose No results for input(s): GLUCAP in the last 168 hours.  Imaging CT chest, abdomen 2/20- thoracic aneurysmal dilation, cardiomegaly with moderate pericardial effusion.  Collapse of lingula with atelectasis in the left lung. I have reviewed the images personally  TEE 05/28/17- type a dissection, large pericardial effusion with loculated thrombus.  DISCUSSION: 72 year old with chronic kidney disease, diastolic heart failure, severe aortic insufficiency admitted with shortness of breath Found to have type a aortic dissection with large pericardial effusion Transferred to ICU with  plan to take to OR tomorrow . ASSESSMENT / PLAN:  PULMONARY A: Dyspnea Evaluated in pulmonary clinic in past with no evidence of intrinsic lung disease CT scan shows atelectasis of the left lung which may be due to compression from aneurysm, pericardial effusion.  There is no clear evidence of pneumonia Observe off antibiotics. Continue supplemental oxygen.  CARDIOVASCULAR A: Type a dissection wound with aortic insufficiency and a circumferential pericardial effusion.   Continue monitoring in the ICU Plan to take the OR tomorrow  RENAL A: Acute kidney injury Holding diuretics.  Nephrology on board.  May nee dialysis postop.  GASTROINTESTINAL A:   Pepcid.  Discussed with Dr. Darcey Nora.  She will be on cardiothoracic service and PCCM will follow along as consult.  Marshell Garfinkel MD [facility] Pager [phone] If no answer or after 3pm call: [phone] 05/29/2017, 9:11 AM

## 2017-05-29 NOTE — Progress Notes (Signed)
1 Day Post-Op Procedure(s) (LRB): TRANSESOPHAGEAL ECHOCARDIOGRAM (TEE) (N/A) Subjective: No complaints nsr  Objective: Vital signs in last 24 hours: Temp:  [98 F (36.7 C)-98.9 F (37.2 C)] 98.5 F (36.9 C) (02/21 0500) Pulse Rate:  [58-84] 59 (02/21 0700) Cardiac Rhythm: Normal sinus rhythm;Bundle branch block;Heart block (02/20 2000) Resp:  [12-26] 15 (02/21 0700) BP: (116-174)/(31-70) 137/42 (02/21 0700) SpO2:  [86 %-100 %] 100 % (02/21 0700) Weight:  [192 lb (87.1 kg)-203 lb 0.7 oz (92.1 kg)] 203 lb 0.7 oz (92.1 kg) (02/21 0500)  Hemodynamic parameters for last 24 hours:    Intake/Output from previous day: 02/20 0701 - 02/21 0700 In: 847 [P.O.:222; I.V.:400] Out: 160 [Urine:160] Intake/Output this shift: No intake/output data recorded.  Neuro intact  Lab Results: Recent Labs    05/29/17 0353  WBC 5.4  HGB 8.6*  HCT 27.5*  PLT 175   BMET:  Recent Labs    05/28/17 0851 05/29/17 0353  NA 140 138  K 4.4 4.6  CL 100* 99*  CO2 30 29  GLUCOSE 98 96  BUN 39* 42*  CREATININE 3.37* 3.26*  CALCIUM 9.9 9.7    PT/INR:  Recent Labs    05/29/17 0353  LABPROT 14.0  INR 1.09   ABG    Component Value Date/Time   TCO2 28 08/14/2016 0612   CBG (last 3)  No results for input(s): GLUCAP in the last 72 hours.  Assessment/Plan: S/P Procedure(s) (LRB): TRANSESOPHAGEAL ECHOCARDIOGRAM (TEE) (N/A) Surgery in am- procedure discussed with patient   LOS: 7 days    [name] 05/29/2017

## 2017-05-29 NOTE — Progress Notes (Signed)
Progress Note  Patient Name: [name] Date of Encounter: 05/29/2017  Primary Cardiologist:   Minus Breeding, MD   Subjective   No SOB.  No pain.    Inpatient Medications    Scheduled Meds: . calcitRIOL  0.5 mcg Oral Daily  . fluticasone  2 spray Each Nare Daily  . hydrALAZINE  75 mg Oral Q8H  . isosorbide mononitrate  30 mg Oral Daily  . labetalol  600 mg Oral BID  . sodium chloride flush  3 mL Intravenous Q12H   Continuous Infusions: . sodium chloride Stopped (05/24/17 1146)  . niCARDipine     PRN Meds: sodium chloride, acetaminophen, albuterol, famotidine, gabapentin, hydrALAZINE, hydrocortisone, ondansetron (ZOFRAN) IV, polyvinyl alcohol, sodium chloride flush   Vital Signs    Vitals:   05/29/17 0400 05/29/17 0500 05/29/17 0600 05/29/17 0700  BP: (!) 119/47 (!) 116/38 (!) 145/45 (!) 137/42  Pulse: 63 (!) 58 64 (!) 59  Resp: 14 14 14 15   Temp:  98.5 F (36.9 C)    TempSrc:  Oral    SpO2: 98% 100% 100% 100%  Weight:  203 lb 0.7 oz (92.1 kg)    Height:        Intake/Output Summary (Last 24 hours) at 05/29/2017 0738 Last data filed at 05/28/2017 1900 Gross per 24 hour  Intake 847 ml  Output 160 ml  Net 687 ml   Filed Weights   05/28/17 0919 05/28/17 1407 05/29/17 0500  Weight: 192 lb (87.1 kg) 193 lb 5.5 oz (87.7 kg) 203 lb 0.7 oz (92.1 kg)    Telemetry    NSR, short runs of ectopy. - Personally Reviewed  ECG    NA  - Personally Reviewed  Physical Exam   GEN: No  acute distress.   Neck: No  JVD Cardiac: RRR, 2/6 systolic murmur and 2/6 diastolic murmur at the apex rubs, or gallops.  Respiratory: Clear   to auscultation bilaterally. GI: Soft, nontender, non-distended, normal bowel sounds  MS:  No edema; No deformity. Neuro:   Nonfocal  Psych: Oriented and appropriate    Labs    Chemistry Recent Labs  Lab 05/26/17 0246 05/27/17 0205 05/28/17 0851 05/29/17 0353  NA 142  142 141 140 138  K 3.5  3.5 4.2 4.4 4.6  CL 100*  100*  101 100* 99*  CO2 30  30 29 30 29   GLUCOSE 99  98 85 98 96  BUN 38*  38* 38* 39* 42*  CREATININE 3.06*  3.08* 3.57* 3.37* 3.26*  CALCIUM 9.7  9.6 9.6 9.9 9.7  PROT  --   --   --  5.9*  ALBUMIN 2.8*  --   --  2.7*  AST  --   --   --  46*  ALT  --   --   --  36  ALKPHOS  --   --   --  52  BILITOT  --   --   --  0.5  GFRNONAA 14*  14* 12* 13* 13*  GFRAA 17*  16* 14* 15* 15*  ANIONGAP 12  12 11 10 10      Hematology Recent Labs  Lab 05/22/17 1102 05/29/17 0353  WBC 7.4 5.4  RBC 3.40* 2.77*  HGB 10.4* 8.6*  HCT 32.9* 27.5*  MCV 96.8 99.3  MCH 30.6 31.0  MCHC 31.6 31.3  RDW 17.1* 17.2*  PLT 200 175    Cardiac Enzymes Recent Labs  Lab 05/22/17 1827 05/22/17 2123 05/23/17 0327 05/23/17  0941  TROPONINI 0.03* 0.28* 0.25* 0.21*    Recent Labs  Lab 05/22/17 1352  TROPIPOC 0.12*     BNP Recent Labs  Lab 05/22/17 1102  BNP 800.0*     DDimer No results for input(s): DDIMER in the last 168 hours.   Radiology    Ct Abdomen Pelvis Wo Contrast  Result Date: 05/28/2017 CLINICAL DATA:  Evaluate thoracic aortic dissection. EXAM: CT CHEST, ABDOMEN AND PELVIS WITHOUT CONTRAST TECHNIQUE: Multidetector CT imaging of the chest, abdomen and pelvis was performed following the standard protocol without IV contrast. COMPARISON:  CT abdomen pelvis - 02/04/2017 FINDINGS: The lack of intravenous contrast limits the ability to evaluate arterial structures. Evaluation of the chest further degraded secondary streak artifact due to patient body habitus and partially overlying bilateral upper extremities. CT CHEST FINDINGS Vascular Findings: Calcified atherosclerotic plaque is seen scattered throughout the descending thoracic aorta. Apparent intraluminal calcifications are within the aortic arch (image 15, series 3), proximal descending thoracic aorta (image 19, series 3) and the distal aspect of the descending thoracic aorta at the level of the diaphragmatic hiatus (image 46, series 3),  potentially compatible with provided history of thoracic aortic dissection however there is no definitive intramural hematoma at either of these locations to suggest definitive acuity on this noncontrast examination. No definitive perivascular stranding on this non-contrast nongated examination. While the ascending thoracic aorta is of normal caliber, there is mild aneurysmal dilatation of the posterior aspect of the aortic arch and mild ectasia of the descending thoracic aorta with measurements as follows. Suspected bovine configuration of the aortic arch. Cardiomegaly.  Small to moderate-sized pericardial effusion. Enlarged caliber the main pulmonary artery measuring 36 mm in diameter. ------------------------------------------------------------- Thoracic aortic measurements: Sinotubular junction Suboptimally evaluated due to lack of intravenous contrast. Proximal ascending aorta 35 mm as measured in greatest oblique axial dimension at the level of the main pulmonary artery. Aortic arch aorta 39 mm as measured in greatest oblique sagittal dimension (sagittal image 77, series 7) and approximately 43 mm in greatest oblique short axis axial diameter (image 14, series 3). Proximal descending thoracic aorta 37 mm as measured in greatest oblique axial dimension at the level of the main pulmonary artery. Distal descending thoracic aorta 37 mm as measured in greatest oblique axial dimension at the level of the diaphragmatic hiatus. Review of the MIP images confirms the above findings. ------------------------------------------------------------- Non-Vascular Findings: Mediastinum/Lymph Nodes: No definitive bulky mediastinal or hilar lymphadenopathy on this noncontrast examination. Lungs/Pleura: Complete atelectasis/collapse of the lingula with associated air bronchograms. Partial atelectasis/collapse of the left upper and bilateral lower lobes, also with associated air bronchograms. No pleural effusion. No pneumothorax.  Musculoskeletal: No acute or aggressive osseous abnormalities. Stigmata of DISH within the thoracic spine. Accentuated thoracic kyphosis without associated vertebral body compression deformity. Regional soft tissues appear normal. Normal appearance of the thyroid gland. CT ABDOMEN PELVIS FINDINGS The lack of intravenous contrast limits the ability to evaluate solid abdominal organs as well as the vascular structures of the abdomen and pelvis. Hepatobiliary: Normal hepatic contour. Normal noncontrast appearance of the gallbladder given degree distention. No radiopaque gallstones. No ascites. Pancreas: Normal noncontrast appearance of the pancreas Spleen: Normal noncontrast appearance of the spleen Adrenals/Urinary Tract: Note is made of an approximately 6.4 cm hypoattenuating (8 Hounsfield unit) lesion arising from the inferior pole the left kidney which is incompletely characterized without intravenous contrast though favored to represent a renal cyst. No renal stones. No renal stones are seen along expected course of either ureter or  the urinary bladder. No urinary obstruction or perinephric stranding. Normal noncontrast appearance the bilateral adrenal glands. Urinary bladder is decompressed with a Foley catheter. Stomach/Bowel: The sigmoid colon is redundant. Moderate colonic stool burden without evidence of enteric obstruction. Normal noncontrast appearance of the terminal ileum and retrocecal appendix. No pneumoperitoneum, pneumatosis or portal venous gas. Vascular/Lymphatic: While the abdominal aorta tapers to a normal caliber at the level of the diaphragmatic hiatus, re-demonstrated marked tortuosity and mild fusiform aneurysmal dilatation of the infrarenal abdominal aorta measuring approximately 3.2 cm in maximal oblique short axis coronal diameter (coronal image 56, series 6), similar to the 01/2017 abdominal CT. Note is made of a high bifurcation of the abdominal aorta. The bilateral common and external  iliac arteries are tortuous but of normal caliber. No bulky retroperitoneal, mesenteric, pelvic or inguinal lymphadenopathy on this noncontrast examination. Reproductive: Post hysterectomy. No discrete adnexal lesion. Small amount of free fluid in the pelvic cul-de-sac. Other: Mild diffuse body wall anasarca. Nodular areas of subcutaneous edema within the bilateral lower abdominal pannus are favored to be the sequela of prior subcutaneous medication administration. Well-healed midline lower abdominal/pelvic incision. Note is made of a small mesenteric fat containing periumbilical hernia. Musculoskeletal: No acute or aggressive osseous abnormalities. Moderate DDD of L4-L5 with disc space height loss, endplate irregularity and small posteriorly directed disc osteophyte complex at this location. 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 IMPRESSION: Chest CT impression: 1. Lack of intravenous contrast markedly degrades evaluation of arterial structures. Examination is further degraded secondary to patient body habitus and associated streak artifact. 2. Linear intraluminal calcifications within the aortic arch and descending thoracic aorta may be compatible provided history of known thoracic aortic dissection, however there is no definitive intramural hematoma on this noncontrast examination to suggest definitive acuity. No definitive perivascular stranding. 3. Mild aneurysmal dilatation of the distal aspect of the aortic arch and mild ectasia of the descending thoracic aorta measuring approximately 43 mm and 37 mm in diameter respectively. 4. Cardiomegaly with small to moderate-sized pericardial effusion. 5. Complete atelectasis/collapse of the lingula with partial atelectasis/collapse of the left upper and bilateral lower lobes with associated air bronchograms potentially secondary to mass  effect due to marked cardiomegaly and pericardial effusion, though underlying infection is not exclude. Abdomen and pelvis CT impression: 1. Marked tortuosity and mild aneurysmal dilatation of the infrarenal abdominal aorta measuring approximately 3.2 cm in diameter, similar to the 01/2017 abdominal CT. 2.  Aortic aneurysm NOS (ICD10-I71.9). 3. Aortic Atherosclerosis (ICD10-I70.0). Electronically Signed   By: Sandi Mariscal M.D.   On: 05/28/2017 17:24   Ct Chest Wo Contrast  Result Date: 05/28/2017 CLINICAL DATA:  Evaluate thoracic aortic dissection. EXAM: CT CHEST, ABDOMEN AND PELVIS WITHOUT CONTRAST TECHNIQUE: Multidetector CT imaging of the chest, abdomen and pelvis was performed following the standard protocol without IV contrast. COMPARISON:  CT abdomen pelvis - 02/04/2017 FINDINGS: The lack of intravenous contrast limits the ability to evaluate arterial structures. Evaluation of the chest further degraded secondary streak artifact due to patient body habitus and partially overlying bilateral upper extremities. CT CHEST FINDINGS Vascular Findings: Calcified atherosclerotic plaque is seen scattered throughout the descending thoracic aorta. Apparent intraluminal calcifications are within the aortic arch (image 15, series 3), proximal descending thoracic aorta (image 19, series 3) and the distal aspect of the descending thoracic aorta at the level of the diaphragmatic hiatus (image 46, series 3), potentially compatible  with provided history of thoracic aortic dissection however there is no definitive intramural hematoma at either of these locations to suggest definitive acuity on this noncontrast examination. No definitive perivascular stranding on this non-contrast nongated examination. While the ascending thoracic aorta is of normal caliber, there is mild aneurysmal dilatation of the posterior aspect of the aortic arch and mild ectasia of the descending thoracic aorta with measurements as follows. Suspected  bovine configuration of the aortic arch. Cardiomegaly.  Small to moderate-sized pericardial effusion. Enlarged caliber the main pulmonary artery measuring 36 mm in diameter. ------------------------------------------------------------- Thoracic aortic measurements: Sinotubular junction Suboptimally evaluated due to lack of intravenous contrast. Proximal ascending aorta 35 mm as measured in greatest oblique axial dimension at the level of the main pulmonary artery. Aortic arch aorta 39 mm as measured in greatest oblique sagittal dimension (sagittal image 77, series 7) and approximately 43 mm in greatest oblique short axis axial diameter (image 14, series 3). Proximal descending thoracic aorta 37 mm as measured in greatest oblique axial dimension at the level of the main pulmonary artery. Distal descending thoracic aorta 37 mm as measured in greatest oblique axial dimension at the level of the diaphragmatic hiatus. Review of the MIP images confirms the above findings. ------------------------------------------------------------- Non-Vascular Findings: Mediastinum/Lymph Nodes: No definitive bulky mediastinal or hilar lymphadenopathy on this noncontrast examination. Lungs/Pleura: Complete atelectasis/collapse of the lingula with associated air bronchograms. Partial atelectasis/collapse of the left upper and bilateral lower lobes, also with associated air bronchograms. No pleural effusion. No pneumothorax. Musculoskeletal: No acute or aggressive osseous abnormalities. Stigmata of DISH within the thoracic spine. Accentuated thoracic kyphosis without associated vertebral body compression deformity. Regional soft tissues appear normal. Normal appearance of the thyroid gland. CT ABDOMEN PELVIS FINDINGS The lack of intravenous contrast limits the ability to evaluate solid abdominal organs as well as the vascular structures of the abdomen and pelvis. Hepatobiliary: Normal hepatic contour. Normal noncontrast appearance of the  gallbladder given degree distention. No radiopaque gallstones. No ascites. Pancreas: Normal noncontrast appearance of the pancreas Spleen: Normal noncontrast appearance of the spleen Adrenals/Urinary Tract: Note is made of an approximately 6.4 cm hypoattenuating (8 Hounsfield unit) lesion arising from the inferior pole the left kidney which is incompletely characterized without intravenous contrast though favored to represent a renal cyst. No renal stones. No renal stones are seen along expected course of either ureter or the urinary bladder. No urinary obstruction or perinephric stranding. Normal noncontrast appearance the bilateral adrenal glands. Urinary bladder is decompressed with a Foley catheter. Stomach/Bowel: The sigmoid colon is redundant. Moderate colonic stool burden without evidence of enteric obstruction. Normal noncontrast appearance of the terminal ileum and retrocecal appendix. No pneumoperitoneum, pneumatosis or portal venous gas. Vascular/Lymphatic: While the abdominal aorta tapers to a normal caliber at the level of the diaphragmatic hiatus, re-demonstrated marked tortuosity and mild fusiform aneurysmal dilatation of the infrarenal abdominal aorta measuring approximately 3.2 cm in maximal oblique short axis coronal diameter (coronal image 56, series 6), similar to the 01/2017 abdominal CT. Note is made of a high bifurcation of the abdominal aorta. The bilateral common and external iliac arteries are tortuous but of normal caliber. No bulky retroperitoneal, mesenteric, pelvic or inguinal lymphadenopathy on this noncontrast examination. Reproductive: Post hysterectomy. No discrete adnexal lesion. Small amount of free fluid in the pelvic cul-de-sac. Other: Mild diffuse body wall anasarca. Nodular areas of subcutaneous edema within the bilateral lower abdominal pannus are favored to be the sequela of prior subcutaneous medication administration. Well-healed midline lower abdominal/pelvic incision.  Note  is made of a small mesenteric fat containing periumbilical hernia. Musculoskeletal: No acute or aggressive osseous abnormalities. Moderate DDD of L4-L5 with disc space height loss, endplate irregularity and small posteriorly directed disc osteophyte complex at this location. 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 IMPRESSION: Chest CT impression: 1. Lack of intravenous contrast markedly degrades evaluation of arterial structures. Examination is further degraded secondary to patient body habitus and associated streak artifact. 2. Linear intraluminal calcifications within the aortic arch and descending thoracic aorta may be compatible provided history of known thoracic aortic dissection, however there is no definitive intramural hematoma on this noncontrast examination to suggest definitive acuity. No definitive perivascular stranding. 3. Mild aneurysmal dilatation of the distal aspect of the aortic arch and mild ectasia of the descending thoracic aorta measuring approximately 43 mm and 37 mm in diameter respectively. 4. Cardiomegaly with small to moderate-sized pericardial effusion. 5. Complete atelectasis/collapse of the lingula with partial atelectasis/collapse of the left upper and bilateral lower lobes with associated air bronchograms potentially secondary to mass effect due to marked cardiomegaly and pericardial effusion, though underlying infection is not exclude. Abdomen and pelvis CT impression: 1. Marked tortuosity and mild aneurysmal dilatation of the infrarenal abdominal aorta measuring approximately 3.2 cm in diameter, similar to the 01/2017 abdominal CT. 2.  Aortic aneurysm NOS (ICD10-I71.9). 3. Aortic Atherosclerosis (ICD10-I70.0). Electronically Signed   By: Sandi Mariscal M.D.   On: 05/28/2017 17:24    Cardiac Studies   TEE 05/29/17   LVEF 60-65% Large  circumferential pericardial effusion Type A dissection of the aorta extending from the right coronary cusp to the descending aorta There appears to be thrombus in the effusion Severe aortic regurgitation Mild mitral regurgitation No LA/LAA thrombus or mass    Patient Profile     72 y.o. female with a hx of known chronic pericardial effusion since 2018 (however progressively becoming larger), diastolic heart failure (EF 60-65% with moderate aortic insufficiency and mitral regurgitationwithgrade 1 DD (per echo 04/20/17), LBBB, CAD, chronic bilateral LE edema, HTN, and CKD stage III who is being seen for the evaluation oflarge pericardial effusion andsevereSOB.    Assessment & Plan    AORTIC DISSECTION:  Images reviewed with Dr. Oval Linsey.  Plan aortic root and valve replacement tomorrow.    ACUTE ON CHRONIC DIASTOLIC HF:   Holding diuretic. Still net negative this admission.   AI:  Valve replacement as above.   PERICARDIAL EFFUSION:  Moderate to large no tamponade.   CKD IV:  Continuing to hold diuresis.  Creat is stable.  ANEMIA:  She was evaluated by Heme in Nov and this was thought to be related to CKD.  She was on Procrit but the insurance has not paid for this for several months.  She has had no signs or symptoms of active bleeding.   HTN:  On labetalol and hydralazine. Hydral held this morning.    ARB and spironolactone are held.     For questions or updates, please contact [facility] Please consult [email or web address] for contact info under Cardiology/STEMI.   Signed, Minus Breeding, MD  05/29/2017, 7:38 AM

## 2017-05-29 NOTE — Progress Notes (Signed)
Pt given [facility] booklet today and pt shown areas to read/review. Video system down, unable to show pt video. Instructed pt to review/read booklet. Pt states Dr Prescott Gum discussed a lot with her and CR nurse did also.

## 2017-05-29 NOTE — Progress Notes (Addendum)
PROGRESS NOTE    [name]  EXB:[id number] DOB: January 30, 1946 DOA: 05/22/2017 PCP: Hoyt Koch, MD  Outpatient Specialists:   Brief Narrative: Patient is a 72 year old African-American female with past medical history significant for chronic kidney disease stage IV, chronic diastolic congestive heart failure, hypertension, chronic pericardial effusion, moderate to severe aortic incompetence, mild to moderate mitral regurgitation, and pulmonary hypertension who presented with worsening dyspnea, orthopnea and leg edema which got severe when she went for an outpatient echo and CT of the lung day prior to admission.  Apparently, the patient has been following up with the pulmonologist who is trying to rule out possible pulmonary disease.  The patient is also known to the cardiology team.  The patient was admitted with acute on chronic diastolic congestive heart failure, severe pericardial effusion, accelerated hypertension, and mildly elevated troponin that could easily be type II elevation.  Cardiology and cardiothoracic surgery were consulted.  Nephrology following.  TEE 2/20 confirmed A aortic dissection.  She was transferred to ICU, CCM was consulted.  Thoracic surgery plans surgical repair 2/22 following which she will be on TCTS service with CCM assisting.  TRH will sign off after 2/21.  Please reconsult TRH for further assistance.  Assessment & Plan:   Principal Problem:   Diastolic congestive heart failure (HCC) Active Problems:   Essential hypertension   CKD (chronic kidney disease) stage 4, GFR 15-29 ml/min (HCC)   Pericardial effusion   Nonrheumatic aortic valve insufficiency   Hypertensive emergency   Ascending aortic dissection (HCC)    Acute on chronic diastolic congestive heart failure: - Initially treated with IV Lasix and oral Aldactone.  Renal insufficiency got worse.  Diuretics remain on hold.  Volume status has improved.  Cardiology following. -Patient was also on  nitroglycerin drip which has been discontinued. -TTE 2/15: LVEF 10-27%, grade 1 diastolic dysfunction mild AS, severe AI, moderate to severe MR and moderate to large free-flowing pericardial effusion. -TEE 2/20: LVEF 60-65%, large circumferential pericardial effusion, type A dissection of aorta extending from the right coronary cusp to the descending aorta, appears to be a thrombus in the pericardial effusion, severe AI, mild MR. -Stable and not clinically volume overloaded.  Type A aortic dissection -Noted on TEE 2/20.  This would explain patient's multiple presentation including worsening AI, worsening pericardial effusion and renal insufficiency.   - TCTS input appreciated >subacute dissection of aortic root causing worsening AI.  Plan for aortic valve replacement with placement of the ascending aorta 05/30/17.  Hypertensive urgency: -Patient has been weaned off nitroglycerin drip several days ago.  Losartan discontinued due to worsening renal insufficiency.  Hydralazine increased to 50 mg 3 times daily.  Labetalol increased to 600 mg twice daily.  Also remains on Imdur 30 mg daily. -Improved and controlled.  Acute on chronic kidney disease stage IV -May have worsened secondary to aggressive diuresis complicating cardiorenal syndrome. -Diuretics being held.  ARB discontinued. -Patient aware of real risks of landing up on HD when she undergoes surgery tomorrow. -Creatinine has slightly improved from 3.37 > 3.26.  Continue to trend daily BMP. -Nephrology following.  Valvular heart disease: -Patient has severe aortic incompetence. -Patient has moderate mitral regurgitation. -Cardiology and cardiothoracic input is appreciated. -Patient is scheduled for aortic valve replacement 2/22.  Severe pericardial effusion: -Etiology remains uncertain. -As per thoracic surgery input, pericardial effusion is slightly larger but has not altered her hemodynamics.  No tamponade features.  Acute  respiratory failure with hypoxia: -This likely multifactorial (likely secondary to combined  effect of above). -Stable. -CT chest abdomen and pelvis without contrast 2/20 appreciated.  Complete atelectasis/collapse of the lingula, partial atelectasis/collapse of the left upper and bilateral lower lobes potentially due to mass-effect from aneurysm, cardiomegaly and pericardial effusion.  No clinical pneumonia.  Monitoring off of antibiotics.  CCM on board.  Anemia Baseline hemoglobin probably in the 9-10 range.  Hemoglobin has dropped from 10.4 on 2/14-8.6 on 2/21.  No overt bleeding.?  Related to dissection.  Chronic anemia due to chronic kidney disease. Follow CBC in a.m. and may need transfusion if hemoglobin drops less than 8 g per DL.  GERD: Cont H2 blocker  Dry eyes: Cont eye drops  Allergies: Cont flonase  Chronic pain: Cont gabapentin Hold tramadol  Code Status:  Full.    DVT Prophylaxis: lovenox Family Communication:  None at bedside. Disposition Plan:  Patient was transferred from stepdown unit to ICU on 2/20.  Disposition pending surgery 2/22 and hospital course.   Consultants:   Cardiology.    Cardiothoracic surgery.  Nephrology.  Procedures:   TTE  TEE 2/20  Antimicrobials:   None   Subjective: I feels "gassy" after eating cabbage.  Passing flatus.  Last BM 2 days ago.  No chest pain, dyspnea, palpitations, dizziness or lightheadedness.  Objective: Vitals:   05/29/17 0500 05/29/17 0600 05/29/17 0700 05/29/17 0858  BP: (!) 116/38 (!) 145/45 (!) 137/42   Pulse: (!) 58 64 (!) 59   Resp: 14 14 15    Temp: 98.5 F (36.9 C)   97.7 F (36.5 C)  TempSrc: Oral   Oral  SpO2: 100% 100% 100%   Weight: 92.1 kg (203 lb 0.7 oz)     Height:        Intake/Output Summary (Last 24 hours) at 05/29/2017 1001 Last data filed at 05/28/2017 1900 Gross per 24 hour  Intake 847 ml  Output 160 ml  Net 687 ml   Filed Weights   05/28/17 0919 05/28/17 1407  05/29/17 0500  Weight: 87.1 kg (192 lb) 87.7 kg (193 lb 5.5 oz) 92.1 kg (203 lb 0.7 oz)    Examination:  General exam: Pleasant elderly female, moderately built and nourished, lying comfortably supine in bed.  Does not appear in any distress. Respiratory system: Clear to auscultation.  No increased work of breathing. Cardiovascular system: S1 and S2 heard, RRR.  No JVD or pedal edema.  Grade 3 x 6 systolic ejection murmur and diastolic murmur best heard at apex.  Telemetry personally reviewed: Sinus rhythm with BBB morphology. Gastrointestinal system: Abdomen is nondistended, soft and nontender.  Normal bowel sounds heard.  Stable without change. Central nervous system: Alert and oriented. No focal neurological deficits.  Stable without change. Extremities: Trace ankle edema.  Moves all extremities symmetrically and well.  Data Reviewed: I have personally reviewed following labs and imaging studies  CBC: Recent Labs  Lab 05/22/17 1102 05/29/17 0353  WBC 7.4 5.4  HGB 10.4* 8.6*  HCT 32.9* 27.5*  MCV 96.8 99.3  PLT 200 573   Basic Metabolic Panel: Recent Labs  Lab 05/25/17 0253 05/26/17 0246 05/27/17 0205 05/28/17 0851 05/29/17 0353  NA 143 142  142 141 140 138  K 3.9 3.5  3.5 4.2 4.4 4.6  CL 100* 100*  100* 101 100* 99*  CO2 31 30  30 29 30 29   GLUCOSE 106* 99  98 85 98 96  BUN 36* 38*  38* 38* 39* 42*  CREATININE 2.95* 3.06*  3.08* 3.57* 3.37* 3.26*  CALCIUM  9.7 9.7  9.6 9.6 9.9 9.7  MG  --   --   --   --  2.1  PHOS  --  3.6  --   --  4.8*   GFR: Estimated Creatinine Clearance: 18.1 mL/min (A) (by C-G formula based on SCr of 3.26 mg/dL (H)). Liver Function Tests: Recent Labs  Lab 05/26/17 0246 05/29/17 0353  AST  --  46*  ALT  --  36  ALKPHOS  --  52  BILITOT  --  0.5  PROT  --  5.9*  ALBUMIN 2.8* 2.7*   Cardiac Enzymes: Recent Labs  Lab 05/22/17 1827 05/22/17 2123 05/23/17 0327 05/23/17 0941  TROPONINI 0.03* 0.28* 0.25* 0.21*   BNP (last 3  results) Recent Labs    11/27/16 1036  PROBNP 202.0*    Radiology Studies: Ct Abdomen Pelvis Wo Contrast  Result Date: 05/28/2017 CLINICAL DATA:  Evaluate thoracic aortic dissection. EXAM: CT CHEST, ABDOMEN AND PELVIS WITHOUT CONTRAST TECHNIQUE: Multidetector CT imaging of the chest, abdomen and pelvis was performed following the standard protocol without IV contrast. COMPARISON:  CT abdomen pelvis - 02/04/2017 FINDINGS: The lack of intravenous contrast limits the ability to evaluate arterial structures. Evaluation of the chest further degraded secondary streak artifact due to patient body habitus and partially overlying bilateral upper extremities. CT CHEST FINDINGS Vascular Findings: Calcified atherosclerotic plaque is seen scattered throughout the descending thoracic aorta. Apparent intraluminal calcifications are within the aortic arch (image 15, series 3), proximal descending thoracic aorta (image 19, series 3) and the distal aspect of the descending thoracic aorta at the level of the diaphragmatic hiatus (image 46, series 3), potentially compatible with provided history of thoracic aortic dissection however there is no definitive intramural hematoma at either of these locations to suggest definitive acuity on this noncontrast examination. No definitive perivascular stranding on this non-contrast nongated examination. While the ascending thoracic aorta is of normal caliber, there is mild aneurysmal dilatation of the posterior aspect of the aortic arch and mild ectasia of the descending thoracic aorta with measurements as follows. Suspected bovine configuration of the aortic arch. Cardiomegaly.  Small to moderate-sized pericardial effusion. Enlarged caliber the main pulmonary artery measuring 36 mm in diameter. ------------------------------------------------------------- Thoracic aortic measurements: Sinotubular junction Suboptimally evaluated due to lack of intravenous contrast. Proximal ascending  aorta 35 mm as measured in greatest oblique axial dimension at the level of the main pulmonary artery. Aortic arch aorta 39 mm as measured in greatest oblique sagittal dimension (sagittal image 77, series 7) and approximately 43 mm in greatest oblique short axis axial diameter (image 14, series 3). Proximal descending thoracic aorta 37 mm as measured in greatest oblique axial dimension at the level of the main pulmonary artery. Distal descending thoracic aorta 37 mm as measured in greatest oblique axial dimension at the level of the diaphragmatic hiatus. Review of the MIP images confirms the above findings. ------------------------------------------------------------- Non-Vascular Findings: Mediastinum/Lymph Nodes: No definitive bulky mediastinal or hilar lymphadenopathy on this noncontrast examination. Lungs/Pleura: Complete atelectasis/collapse of the lingula with associated air bronchograms. Partial atelectasis/collapse of the left upper and bilateral lower lobes, also with associated air bronchograms. No pleural effusion. No pneumothorax. Musculoskeletal: No acute or aggressive osseous abnormalities. Stigmata of DISH within the thoracic spine. Accentuated thoracic kyphosis without associated vertebral body compression deformity. Regional soft tissues appear normal. Normal appearance of the thyroid gland. CT ABDOMEN PELVIS FINDINGS The lack of intravenous contrast limits the ability to evaluate solid abdominal organs as well as the vascular structures of  the abdomen and pelvis. Hepatobiliary: Normal hepatic contour. Normal noncontrast appearance of the gallbladder given degree distention. No radiopaque gallstones. No ascites. Pancreas: Normal noncontrast appearance of the pancreas Spleen: Normal noncontrast appearance of the spleen Adrenals/Urinary Tract: Note is made of an approximately 6.4 cm hypoattenuating (8 Hounsfield unit) lesion arising from the inferior pole the left kidney which is incompletely  characterized without intravenous contrast though favored to represent a renal cyst. No renal stones. No renal stones are seen along expected course of either ureter or the urinary bladder. No urinary obstruction or perinephric stranding. Normal noncontrast appearance the bilateral adrenal glands. Urinary bladder is decompressed with a Foley catheter. Stomach/Bowel: The sigmoid colon is redundant. Moderate colonic stool burden without evidence of enteric obstruction. Normal noncontrast appearance of the terminal ileum and retrocecal appendix. No pneumoperitoneum, pneumatosis or portal venous gas. Vascular/Lymphatic: While the abdominal aorta tapers to a normal caliber at the level of the diaphragmatic hiatus, re-demonstrated marked tortuosity and mild fusiform aneurysmal dilatation of the infrarenal abdominal aorta measuring approximately 3.2 cm in maximal oblique short axis coronal diameter (coronal image 56, series 6), similar to the 01/2017 abdominal CT. Note is made of a high bifurcation of the abdominal aorta. The bilateral common and external iliac arteries are tortuous but of normal caliber. No bulky retroperitoneal, mesenteric, pelvic or inguinal lymphadenopathy on this noncontrast examination. Reproductive: Post hysterectomy. No discrete adnexal lesion. Small amount of free fluid in the pelvic cul-de-sac. Other: Mild diffuse body wall anasarca. Nodular areas of subcutaneous edema within the bilateral lower abdominal pannus are favored to be the sequela of prior subcutaneous medication administration. Well-healed midline lower abdominal/pelvic incision. Note is made of a small mesenteric fat containing periumbilical hernia. Musculoskeletal: No acute or aggressive osseous abnormalities. Moderate DDD of L4-L5 with disc space height loss, endplate irregularity and small posteriorly directed disc osteophyte complex at this location. Small amount of avascular necrosis is noted involving the anterior aspect of the  right femoral head (image 99, series 3) without associated articular surface collapse. IMPRESSION: Chest CT impression: 1. Lack of intravenous contrast markedly degrades evaluation of arterial structures. Examination is further degraded secondary to patient body habitus and associated streak artifact. 2. Linear intraluminal calcifications within the aortic arch and descending thoracic aorta may be compatible provided history of known thoracic aortic dissection, however there is no definitive intramural hematoma on this noncontrast examination to suggest definitive acuity. No definitive perivascular stranding. 3. Mild aneurysmal dilatation of the distal aspect of the aortic arch and mild ectasia of the descending thoracic aorta measuring approximately 43 mm and 37 mm in diameter respectively. 4. Cardiomegaly with small to moderate-sized pericardial effusion. 5. Complete atelectasis/collapse of the lingula with partial atelectasis/collapse of the left upper and bilateral lower lobes with associated air bronchograms potentially secondary to mass effect due to marked cardiomegaly and pericardial effusion, though underlying infection is not exclude. Abdomen and pelvis CT impression: 1. Marked tortuosity and mild aneurysmal dilatation of the infrarenal abdominal aorta measuring approximately 3.2 cm in diameter, similar to the 01/2017 abdominal CT. 2.  Aortic aneurysm NOS (ICD10-I71.9). 3. Aortic Atherosclerosis (ICD10-I70.0). Electronically Signed   By: Sandi Mariscal M.D.   On: 05/28/2017 17:24   Ct Chest Wo Contrast  Result Date: 05/28/2017 CLINICAL DATA:  Evaluate thoracic aortic dissection. EXAM: CT CHEST, ABDOMEN AND PELVIS WITHOUT CONTRAST TECHNIQUE: Multidetector CT imaging of the chest, abdomen and pelvis was performed following the standard protocol without IV contrast. COMPARISON:  CT abdomen pelvis - 02/04/2017 FINDINGS: The lack  of intravenous contrast limits the ability to evaluate arterial structures.  Evaluation of the chest further degraded secondary streak artifact due to patient body habitus and partially overlying bilateral upper extremities. CT CHEST FINDINGS Vascular Findings: Calcified atherosclerotic plaque is seen scattered throughout the descending thoracic aorta. Apparent intraluminal calcifications are within the aortic arch (image 15, series 3), proximal descending thoracic aorta (image 19, series 3) and the distal aspect of the descending thoracic aorta at the level of the diaphragmatic hiatus (image 46, series 3), potentially compatible with provided history of thoracic aortic dissection however there is no definitive intramural hematoma at either of these locations to suggest definitive acuity on this noncontrast examination. No definitive perivascular stranding on this non-contrast nongated examination. While the ascending thoracic aorta is of normal caliber, there is mild aneurysmal dilatation of the posterior aspect of the aortic arch and mild ectasia of the descending thoracic aorta with measurements as follows. Suspected bovine configuration of the aortic arch. Cardiomegaly.  Small to moderate-sized pericardial effusion. Enlarged caliber the main pulmonary artery measuring 36 mm in diameter. ------------------------------------------------------------- Thoracic aortic measurements: Sinotubular junction Suboptimally evaluated due to lack of intravenous contrast. Proximal ascending aorta 35 mm as measured in greatest oblique axial dimension at the level of the main pulmonary artery. Aortic arch aorta 39 mm as measured in greatest oblique sagittal dimension (sagittal image 77, series 7) and approximately 43 mm in greatest oblique short axis axial diameter (image 14, series 3). Proximal descending thoracic aorta 37 mm as measured in greatest oblique axial dimension at the level of the main pulmonary artery. Distal descending thoracic aorta 37 mm as measured in greatest oblique axial dimension at  the level of the diaphragmatic hiatus. Review of the MIP images confirms the above findings. ------------------------------------------------------------- Non-Vascular Findings: Mediastinum/Lymph Nodes: No definitive bulky mediastinal or hilar lymphadenopathy on this noncontrast examination. Lungs/Pleura: Complete atelectasis/collapse of the lingula with associated air bronchograms. Partial atelectasis/collapse of the left upper and bilateral lower lobes, also with associated air bronchograms. No pleural effusion. No pneumothorax. Musculoskeletal: No acute or aggressive osseous abnormalities. Stigmata of DISH within the thoracic spine. Accentuated thoracic kyphosis without associated vertebral body compression deformity. Regional soft tissues appear normal. Normal appearance of the thyroid gland. CT ABDOMEN PELVIS FINDINGS The lack of intravenous contrast limits the ability to evaluate solid abdominal organs as well as the vascular structures of the abdomen and pelvis. Hepatobiliary: Normal hepatic contour. Normal noncontrast appearance of the gallbladder given degree distention. No radiopaque gallstones. No ascites. Pancreas: Normal noncontrast appearance of the pancreas Spleen: Normal noncontrast appearance of the spleen Adrenals/Urinary Tract: Note is made of an approximately 6.4 cm hypoattenuating (8 Hounsfield unit) lesion arising from the inferior pole the left kidney which is incompletely characterized without intravenous contrast though favored to represent a renal cyst. No renal stones. No renal stones are seen along expected course of either ureter or the urinary bladder. No urinary obstruction or perinephric stranding. Normal noncontrast appearance the bilateral adrenal glands. Urinary bladder is decompressed with a Foley catheter. Stomach/Bowel: The sigmoid colon is redundant. Moderate colonic stool burden without evidence of enteric obstruction. Normal noncontrast appearance of the terminal ileum and  retrocecal appendix. No pneumoperitoneum, pneumatosis or portal venous gas. Vascular/Lymphatic: While the abdominal aorta tapers to a normal caliber at the level of the diaphragmatic hiatus, re-demonstrated marked tortuosity and mild fusiform aneurysmal dilatation of the infrarenal abdominal aorta measuring approximately 3.2 cm in maximal oblique short axis coronal diameter (coronal image 56, series 6), similar to the 01/2017  abdominal CT. Note is made of a high bifurcation of the abdominal aorta. The bilateral common and external iliac arteries are tortuous but of normal caliber. No bulky retroperitoneal, mesenteric, pelvic or inguinal lymphadenopathy on this noncontrast examination. Reproductive: Post hysterectomy. No discrete adnexal lesion. Small amount of free fluid in the pelvic cul-de-sac. Other: Mild diffuse body wall anasarca. Nodular areas of subcutaneous edema within the bilateral lower abdominal pannus are favored to be the sequela of prior subcutaneous medication administration. Well-healed midline lower abdominal/pelvic incision. Note is made of a small mesenteric fat containing periumbilical hernia. Musculoskeletal: No acute or aggressive osseous abnormalities. Moderate DDD of L4-L5 with disc space height loss, endplate irregularity and small posteriorly directed disc osteophyte complex at this location. 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 IMPRESSION: Chest CT impression: 1. Lack of intravenous contrast markedly degrades evaluation of arterial structures. Examination is further degraded secondary to patient body habitus and associated streak artifact. 2. Linear intraluminal calcifications within the aortic arch and descending thoracic aorta may be compatible provided history of known thoracic aortic dissection, however there is no definitive intramural hematoma on this  noncontrast examination to suggest definitive acuity. No definitive perivascular stranding. 3. Mild aneurysmal dilatation of the distal aspect of the aortic arch and mild ectasia of the descending thoracic aorta measuring approximately 43 mm and 37 mm in diameter respectively. 4. Cardiomegaly with small to moderate-sized pericardial effusion. 5. Complete atelectasis/collapse of the lingula with partial atelectasis/collapse of the left upper and bilateral lower lobes with associated air bronchograms potentially secondary to mass effect due to marked cardiomegaly and pericardial effusion, though underlying infection is not exclude. Abdomen and pelvis CT impression: 1. Marked tortuosity and mild aneurysmal dilatation of the infrarenal abdominal aorta measuring approximately 3.2 cm in diameter, similar to the 01/2017 abdominal CT. 2.  Aortic aneurysm NOS (ICD10-I71.9). 3. Aortic Atherosclerosis (ICD10-I70.0). Electronically Signed   By: Sandi Mariscal M.D.   On: 05/28/2017 17:24   Dg Chest Port 1 View  Result Date: 05/29/2017 CLINICAL DATA:  Shortness of breath. EXAM: PORTABLE CHEST 1 VIEW COMPARISON:  CT 05/28/2017.  Chest x-ray 05/22/2017. FINDINGS: Stable severe cardiomegaly. Persistent dense atelectasis/consolidation lingula/left lung base. Right lung is clear. No pleural effusion or pneumothorax. No acute bony abnormality. IMPRESSION: 1.  Stable severe cardiomegaly. 2. Persistent dense atelectasis/consolidation lingula/left lung base. No interim change. Electronically Signed   By: [place]   On: 05/29/2017 07:57        Scheduled Meds: . calcitRIOL  0.5 mcg Oral Daily  . fluticasone  2 spray Each Nare Daily  . hydrALAZINE  75 mg Oral Q8H  . isosorbide mononitrate  30 mg Oral Daily  . labetalol  600 mg Oral BID  . sodium chloride flush  3 mL Intravenous Q12H   Continuous Infusions: . sodium chloride Stopped (05/24/17 1146)  . niCARDipine       LOS: 7 days    Time spent: 25  minutes.  Vernell Leep, MD, FACP, [facility]. [facility] Pager [phone]  If 7PM-7AM, please contact night-coverage [email or web address] Password [facility] 05/29/2017, 10:01 AM

## 2017-05-29 NOTE — Progress Notes (Signed)
Admit: 05/22/2017 LOS: 7  8F with AoCKD4 and dCHF exacerbation with severe AI in need of eval for AV repair  Subjective:  Plan is for aortic valve replacement and aorta root repair 2/22 Creatinine slightly improved this morning, potassium 4.6, bicarbonate 29, hemoglobin 8.6. Minimal urine output recorded, not sure if accurate Pt w/o co this AM   02/20 0701 - 02/21 0700 In: 793 [P.O.:222; I.V.:400] Out: 160 [Urine:160]  Filed Weights   05/28/17 0919 05/28/17 1407 05/29/17 0500  Weight: 87.1 kg (192 lb) 87.7 kg (193 lb 5.5 oz) 92.1 kg (203 lb 0.7 oz)    Scheduled Meds: . calcitRIOL  0.5 mcg Oral Daily  . fluticasone  2 spray Each Nare Daily  . hydrALAZINE  75 mg Oral Q8H  . isosorbide mononitrate  30 mg Oral Daily  . labetalol  600 mg Oral BID  . sodium chloride flush  3 mL Intravenous Q12H   Continuous Infusions: . sodium chloride Stopped (05/24/17 1146)  . niCARDipine     PRN Meds:.sodium chloride, acetaminophen, albuterol, famotidine, gabapentin, hydrALAZINE, hydrocortisone, ondansetron (ZOFRAN) IV, polyvinyl alcohol, sodium chloride flush  Current Labs: reviewed    Physical Exam:  Blood pressure (!) 137/42, pulse (!) 59, temperature 98.5 F (36.9 C), temperature source Oral, resp. rate 15, height 5\' 6"  (1.676 m), weight 92.1 kg (203 lb 0.7 oz), SpO2 100 %. GEN: No acute distress, pleasant ENT: NCAT EYES: EOMI CV: Regular rhythm, has early diastolic murmur and mid systolic murmur, normal rate  PULM: CTA B  ABD: soft, nontender SKIN: No rashes or lesions EXT:  No edema bilaterally  A 1. CKD 4, discussed real risk of progressive renal failure related to cardiac surgery; however with recent findings of aortic dissection she understands the need to accept the risk 2. Type A aortic dissection with pericardial effusion and severe aortic insufficiency 3. Secondary hyperparathyroidism on calcitriol 4. Hypertension with ARB on hold; continued on hydralazine, labetalol,  isosorbide 5. CAD  P 1. Continue to hold diuretics at the current time 2. We will follow closely, surgery tomorrow 3. Pt understands and accepts risks of dialysis related to cardiac surgery 4. Daily weights, Daily Renal Panel, Strict I/Os, Avoid nephrotoxins (NSAIDs, judicious IV Contrast)    Pearson Grippe MD 05/29/2017, 8:06 AM  Recent Labs  Lab 05/26/17 0246 05/27/17 0205 05/28/17 0851 05/29/17 0353  NA 142  142 141 140 138  K 3.5  3.5 4.2 4.4 4.6  CL 100*  100* 101 100* 99*  CO2 30  30 29 30 29   GLUCOSE 99  98 85 98 96  BUN 38*  38* 38* 39* 42*  CREATININE 3.06*  3.08* 3.57* 3.37* 3.26*  CALCIUM 9.7  9.6 9.6 9.9 9.7  PHOS 3.6  --   --  4.8*   Recent Labs  Lab 05/22/17 1102 05/29/17 0353  WBC 7.4 5.4  HGB 10.4* 8.6*  HCT 32.9* 27.5*  MCV 96.8 99.3  PLT 200 175

## 2017-05-29 NOTE — Care Management Note (Signed)
Case Management Note Marvetta Gibbons RN, BSN Unit 4E-Case Manager [phone]  Patient Details  Name: [name] MRN: [id number] Date of Birth: [birth year]  Subjective/Objective:  Pt admitted with SOB/CHF exacerbation,HTN, initially  treated with lasix then had worsening renal function.                   Action/Plan: PTA pt lived at home with spouse- CM to follow for transition of care needs.   Expected Discharge Date:                  Expected Discharge Plan:     In-House Referral:     Discharge planning Services  CM Consult  Post Acute Care Choice:    Choice offered to:     DME Arranged:    DME Agency:     HH Arranged:    HH Agency:     Status of Service:  In process, will continue to follow  If discussed at Long Length of Stay Meetings, dates discussed:    Discharge Disposition:   Additional Comments:  05/29/17- [place] RN, CM-  s/p TEE 2/20 confirmed A aortic dissection. She was transferred to ICU, CCM was consulted.  Thoracic surgery plans surgical repair 2/22   Marvetta Gibbons [place], [place] 05/29/2017, 10:49 AM

## 2017-05-29 NOTE — Progress Notes (Signed)
Pre-op Cardiac Surgery  Carotid Findings: Findings suggest 1-39% internal carotid artery stenosis bilaterally. Vertebral arteries are patent with antegrade flow.  Upper Extremity Right Left  Brachial Pressures 134-Triphasic 142-Triphasic  Radial Waveforms Triphasic Triphasic  Ulnar Waveforms Triphasic Triphasic  Palmar Arch (Allen's Test) Signal decreases 50% with radial compression, obliterates with ulnar compression. Within normal limits   Lower  Extremity Right Left  Dorsalis Pedis Triphasic Triphasic  Posterior Tibial Triphasic Triphasic    Findings:   Pedal waveforms are within normal limits at rest.   05/29/2017 1:38 PM Maudry Mayhew, BS, RVT, RDCS, RDMS

## 2017-05-29 NOTE — Progress Notes (Signed)
1320-1340 Pt has [facility] booklet. Gave care guide and importance of mobility and IS after surgery. Pt demonstrated 500 ml on IS correctly. Discussed sternal precautions and keeping in the tube. Pt stated husband will be available after discharge to care for her. Pt stated she is going to watch pre op video today. Graylon Good RN BSN 05/29/2017 1:36 PM

## 2017-05-29 NOTE — Telephone Encounter (Signed)
lmtcb x2 for pt. 

## 2017-05-29 NOTE — Anesthesia Preprocedure Evaluation (Addendum)
Anesthesia Evaluation  Patient identified by MRN, date of birth, ID band Patient awake    Reviewed: Allergy & Precautions, H&P , NPO status , Patient's Chart, lab work & pertinent test results, reviewed documented beta blocker date and time   History of Anesthesia Complications (+) PONV and history of anesthetic complications  Airway Mallampati: II  TM Distance: >3 FB Neck ROM: full    Dental  (+) Poor Dentition, Dental Advisory Given   Pulmonary former smoker,    breath sounds clear to auscultation       Cardiovascular hypertension, Pt. on home beta blockers and Pt. on medications + Peripheral Vascular Disease  + dysrhythmias + Valvular Problems/Murmurs AI  Rhythm:Regular Rate:Normal + Systolic murmurs and + Diastolic murmurs '19 TEE - Left ventricle: There was severe concentric hypertrophy. EF was in the range of 60% to 65%. Wall motion was normal. There was severe AI. A Stanford type A dissection from the root, involving the aortic valve to the descending aorta was seen. Mild MR. Right ventricle cavity size was normal. Wall thickness was normal. Systolic function was normal. Trivial TR. A large pericardial effusion measuring up to 2.97 cm was identified with evidence of loculated thrombus. Mildly increased PASP.  '19 Carotid US - Near normal carotids b/l  '19 Chest/Abd CT Angio - Mild aneurysmal dilatation of the distal aspect of the aortic arch and mild ectasia of the descending thoracic aorta measuring approximately 43 mm and 37 mm in diameter respectively. Complete atelectasis/collapse of the lingula with partial atelectasis/collapse of the left upper and bilateral lower lobes with associated air bronchograms potentially secondary to mass effect due to marked cardiomegaly and pericardial effusion, though underlying infection is not excluded. Marked tortuosity and mild aneurysmal dilatation of the infrarenal abdominal aorta  measuring approximately 3.2 cm in diameter, similar to the 01/2017 abdominal CT.  LBBB, Left axis deviation   Neuro/Psych  Neuromuscular disease    GI/Hepatic negative GI ROS, Neg liver ROS,   Endo/Other  Obesity  Renal/GU Renal InsufficiencyRenal disease     Musculoskeletal  (+) Arthritis ,   Abdominal   Peds  Hematology  (+) anemia ,   Anesthesia Other Findings   Reproductive/Obstetrics                            Anesthesia Physical  Anesthesia Plan  ASA: IV  Anesthesia Plan: General   Post-op Pain Management:    Induction: Intravenous  PONV Risk Score and Plan: 4 or greater and Treatment may vary due to age or medical condition  Airway Management Planned: Oral ETT  Additional Equipment: Arterial line, CVP, PA Cath, TEE and Ultrasound Guidance Line Placement  Intra-op Plan: Utilization Of Total Body Hypothermia per surgeon request and Delibrate Circulatory arrest per surgeon request  Post-operative Plan: Post-operative intubation/ventilation  Informed Consent: I have reviewed the patients History and Physical, chart, labs and discussed the procedure including the risks, benefits and alternatives for the proposed anesthesia with the patient or authorized representative who has indicated his/her understanding and acceptance.   Dental advisory given  Plan Discussed with: CRNA  Anesthesia Plan Comments:         Anesthesia Quick Evaluation

## 2017-05-29 NOTE — Anesthesia Postprocedure Evaluation (Signed)
Anesthesia Post Note  Patient: [name]  Procedure(s) Performed: TRANSESOPHAGEAL ECHOCARDIOGRAM (TEE) (N/A )     Patient location during evaluation: PACU Anesthesia Type: MAC Level of consciousness: awake and alert Pain management: pain level controlled Vital Signs Assessment: post-procedure vital signs reviewed and stable Respiratory status: spontaneous breathing, nonlabored ventilation, respiratory function stable and patient connected to nasal cannula oxygen Cardiovascular status: stable and blood pressure returned to baseline Postop Assessment: no apparent nausea or vomiting Anesthetic complications: no    Last Vitals:  Vitals:   05/29/17 0600 05/29/17 0700  BP: (!) 145/45 (!) 137/42  Pulse: 64 (!) 59  Resp: 14 15  Temp:    SpO2: 100% 100%    Last Pain:  Vitals:   05/29/17 0500  TempSrc: Oral  PainSc:                  [place]

## 2017-05-29 NOTE — Telephone Encounter (Signed)
Duplicate message, see telephone encounter dated 05/27/17.

## 2017-05-30 ENCOUNTER — Inpatient Hospital Stay (HOSPITAL_COMMUNITY): Payer: Medicare Other | Admitting: Anesthesiology

## 2017-05-30 ENCOUNTER — Inpatient Hospital Stay (HOSPITAL_COMMUNITY): Payer: Medicare Other

## 2017-05-30 ENCOUNTER — Encounter (HOSPITAL_COMMUNITY)
Admission: EM | Disposition: A | Discharge status: Home Health | Payer: Self-pay | Source: Home / Self Care | Attending: Cardiothoracic Surgery

## 2017-05-30 ENCOUNTER — Other Ambulatory Visit: Payer: Self-pay

## 2017-05-30 DIAGNOSIS — I351 Nonrheumatic aortic (valve) insufficiency: Secondary | ICD-10-CM

## 2017-05-30 DIAGNOSIS — Z952 Presence of prosthetic heart valve: Secondary | ICD-10-CM

## 2017-05-30 DIAGNOSIS — I7101 Dissection of thoracic aorta: Secondary | ICD-10-CM

## 2017-05-30 DIAGNOSIS — I350 Nonrheumatic aortic (valve) stenosis: Secondary | ICD-10-CM

## 2017-05-30 DIAGNOSIS — Z953 Presence of xenogenic heart valve: Secondary | ICD-10-CM

## 2017-05-30 HISTORY — PX: REPAIR OF ACUTE ASCENDING THORACIC AORTIC DISSECTION: SHX6323

## 2017-05-30 HISTORY — PX: TEE WITHOUT CARDIOVERSION: SHX5443

## 2017-05-30 HISTORY — PX: AORTIC VALVE REPLACEMENT: SHX41

## 2017-05-30 LAB — POCT I-STAT, CHEM 8
BUN: 39 mg/dL — AB (ref 6–20)
BUN: 39 mg/dL — ABNORMAL HIGH (ref 6–20)
BUN: 41 mg/dL — AB (ref 6–20)
BUN: 41 mg/dL — ABNORMAL HIGH (ref 6–20)
BUN: 43 mg/dL — AB (ref 6–20)
BUN: 44 mg/dL — AB (ref 6–20)
BUN: 45 mg/dL — AB (ref 6–20)
BUN: 46 mg/dL — ABNORMAL HIGH (ref 6–20)
BUN: 54 mg/dL — AB (ref 6–20)
CALCIUM ION: 1.16 mmol/L (ref 1.15–1.40)
CALCIUM ION: 1.14 mmol/L — AB (ref 1.15–1.40)
CALCIUM ION: 1.34 mmol/L (ref 1.15–1.40)
CHLORIDE: 99 mmol/L — AB (ref 101–111)
CHLORIDE: 101 mmol/L (ref 101–111)
CHLORIDE: 102 mmol/L (ref 101–111)
CHLORIDE: 102 mmol/L (ref 101–111)
CHLORIDE: 103 mmol/L (ref 101–111)
CHLORIDE: 106 mmol/L (ref 101–111)
CREATININE: 2.3 mg/dL — AB (ref 0.44–1.00)
CREATININE: 2.3 mg/dL — AB (ref 0.44–1.00)
CREATININE: 2.5 mg/dL — AB (ref 0.44–1.00)
Calcium, Ion: 1.1 mmol/L — ABNORMAL LOW (ref 1.15–1.40)
Calcium, Ion: 1.18 mmol/L (ref 1.15–1.40)
Calcium, Ion: 1.19 mmol/L (ref 1.15–1.40)
Calcium, Ion: 1.28 mmol/L (ref 1.15–1.40)
Calcium, Ion: 1.28 mmol/L (ref 1.15–1.40)
Calcium, Ion: 1.32 mmol/L (ref 1.15–1.40)
Chloride: 104 mmol/L (ref 101–111)
Chloride: 98 mmol/L — ABNORMAL LOW (ref 101–111)
Chloride: 99 mmol/L — ABNORMAL LOW (ref 101–111)
Creatinine, Ser: 2.7 mg/dL — ABNORMAL HIGH (ref 0.44–1.00)
Creatinine, Ser: 2.5 mg/dL — ABNORMAL HIGH (ref 0.44–1.00)
Creatinine, Ser: 2.4 mg/dL — ABNORMAL HIGH (ref 0.44–1.00)
Creatinine, Ser: 2.5 mg/dL — ABNORMAL HIGH (ref 0.44–1.00)
Creatinine, Ser: 2.3 mg/dL — ABNORMAL HIGH (ref 0.44–1.00)
Creatinine, Ser: 2.2 mg/dL — ABNORMAL HIGH (ref 0.44–1.00)
GLUCOSE: 178 mg/dL — AB (ref 65–99)
GLUCOSE: 188 mg/dL — AB (ref 65–99)
GLUCOSE: 162 mg/dL — AB (ref 65–99)
Glucose, Bld: 107 mg/dL — ABNORMAL HIGH (ref 65–99)
Glucose, Bld: 113 mg/dL — ABNORMAL HIGH (ref 65–99)
Glucose, Bld: 116 mg/dL — ABNORMAL HIGH (ref 65–99)
Glucose, Bld: 119 mg/dL — ABNORMAL HIGH (ref 65–99)
Glucose, Bld: 121 mg/dL — ABNORMAL HIGH (ref 65–99)
Glucose, Bld: 99 mg/dL (ref 65–99)
HCT: 22 % — ABNORMAL LOW (ref 36.0–46.0)
HCT: 22 % — ABNORMAL LOW (ref 36.0–46.0)
HCT: 26 % — ABNORMAL LOW (ref 36.0–46.0)
HCT: 35 % — ABNORMAL LOW (ref 36.0–46.0)
HEMATOCRIT: 25 % — AB (ref 36.0–46.0)
HEMATOCRIT: 25 % — AB (ref 36.0–46.0)
HEMATOCRIT: 26 % — AB (ref 36.0–46.0)
HEMATOCRIT: 27 % — AB (ref 36.0–46.0)
HEMATOCRIT: 23 % — AB (ref 36.0–46.0)
HEMOGLOBIN: 8.5 g/dL — AB (ref 12.0–15.0)
HEMOGLOBIN: 7.5 g/dL — AB (ref 12.0–15.0)
HEMOGLOBIN: 7.5 g/dL — AB (ref 12.0–15.0)
HEMOGLOBIN: 7.8 g/dL — AB (ref 12.0–15.0)
Hemoglobin: 11.9 g/dL — ABNORMAL LOW (ref 12.0–15.0)
Hemoglobin: 8.5 g/dL — ABNORMAL LOW (ref 12.0–15.0)
Hemoglobin: 8.8 g/dL — ABNORMAL LOW (ref 12.0–15.0)
Hemoglobin: 8.8 g/dL — ABNORMAL LOW (ref 12.0–15.0)
Hemoglobin: 9.2 g/dL — ABNORMAL LOW (ref 12.0–15.0)
POTASSIUM: 4.4 mmol/L (ref 3.5–5.1)
POTASSIUM: 4.4 mmol/L (ref 3.5–5.1)
POTASSIUM: 5.3 mmol/L — AB (ref 3.5–5.1)
POTASSIUM: 5.4 mmol/L — AB (ref 3.5–5.1)
POTASSIUM: 5.9 mmol/L — AB (ref 3.5–5.1)
POTASSIUM: 6.3 mmol/L — AB (ref 3.5–5.1)
Potassium: 4.5 mmol/L (ref 3.5–5.1)
Potassium: 5.3 mmol/L — ABNORMAL HIGH (ref 3.5–5.1)
Potassium: 6 mmol/L — ABNORMAL HIGH (ref 3.5–5.1)
SODIUM: 139 mmol/L (ref 135–145)
SODIUM: 139 mmol/L (ref 135–145)
SODIUM: 137 mmol/L (ref 135–145)
Sodium: 136 mmol/L (ref 135–145)
Sodium: 137 mmol/L (ref 135–145)
Sodium: 138 mmol/L (ref 135–145)
Sodium: 138 mmol/L (ref 135–145)
Sodium: 138 mmol/L (ref 135–145)
Sodium: 139 mmol/L (ref 135–145)
TCO2: 25 mmol/L (ref 22–32)
TCO2: 25 mmol/L (ref 22–32)
TCO2: 26 mmol/L (ref 22–32)
TCO2: 27 mmol/L (ref 22–32)
TCO2: 29 mmol/L (ref 22–32)
TCO2: 30 mmol/L (ref 22–32)
TCO2: 30 mmol/L (ref 22–32)
TCO2: 30 mmol/L (ref 22–32)
TCO2: 30 mmol/L (ref 22–32)

## 2017-05-30 LAB — POCT I-STAT 3, ART BLOOD GAS (G3+)
ACID-BASE DEFICIT: 1 mmol/L (ref 0.0–2.0)
Acid-Base Excess: 5 mmol/L — ABNORMAL HIGH (ref 0.0–2.0)
Acid-base deficit: 1 mmol/L (ref 0.0–2.0)
Acid-base deficit: 1 mmol/L (ref 0.0–2.0)
BICARBONATE: 24 mmol/L (ref 20.0–28.0)
BICARBONATE: 26.2 mmol/L (ref 20.0–28.0)
Bicarbonate: 30 mmol/L — ABNORMAL HIGH (ref 20.0–28.0)
Bicarbonate: 23.6 mmol/L (ref 20.0–28.0)
Bicarbonate: 24.9 mmol/L (ref 20.0–28.0)
O2 SAT: 100 %
O2 SAT: 100 %
O2 Saturation: 100 %
O2 Saturation: 97 %
O2 Saturation: 97 %
PCO2 ART: 46.5 mmHg (ref 32.0–48.0)
PCO2 ART: 42.1 mmHg (ref 32.0–48.0)
PH ART: 7.418 (ref 7.350–7.450)
PO2 ART: 367 mmHg — AB (ref 83.0–108.0)
PO2 ART: 380 mmHg — AB (ref 83.0–108.0)
PO2 ART: 94 mmHg (ref 83.0–108.0)
Patient temperature: 35.2
Patient temperature: 98.6
TCO2: 31 mmol/L (ref 22–32)
TCO2: 25 mmol/L (ref 22–32)
TCO2: 25 mmol/L (ref 22–32)
TCO2: 28 mmol/L (ref 22–32)
TCO2: 26 mmol/L (ref 22–32)
pCO2 arterial: 42.5 mmHg (ref 32.0–48.0)
pCO2 arterial: 39.7 mmHg (ref 32.0–48.0)
pCO2 arterial: 51.1 mmHg — ABNORMAL HIGH (ref 32.0–48.0)
pH, Arterial: 7.36 (ref 7.350–7.450)
pH, Arterial: 7.383 (ref 7.350–7.450)
pH, Arterial: 7.31 — ABNORMAL LOW (ref 7.350–7.450)
pH, Arterial: 7.38 (ref 7.350–7.450)
pO2, Arterial: 217 mmHg — ABNORMAL HIGH (ref 83.0–108.0)
pO2, Arterial: 88 mmHg (ref 83.0–108.0)

## 2017-05-30 LAB — PREPARE RBC (CROSSMATCH)

## 2017-05-30 LAB — CBC
HCT: 27.9 % — ABNORMAL LOW (ref 36.0–46.0)
HCT: 32.4 % — ABNORMAL LOW (ref 36.0–46.0)
HCT: 36.7 % (ref 36.0–46.0)
Hemoglobin: 8.7 g/dL — ABNORMAL LOW (ref 12.0–15.0)
Hemoglobin: 10.9 g/dL — ABNORMAL LOW (ref 12.0–15.0)
Hemoglobin: 12.2 g/dL (ref 12.0–15.0)
MCH: 30.7 pg (ref 26.0–34.0)
MCH: 31.8 pg (ref 26.0–34.0)
MCH: 31.1 pg (ref 26.0–34.0)
MCHC: 31.2 g/dL (ref 30.0–36.0)
MCHC: 33.6 g/dL (ref 30.0–36.0)
MCHC: 33.2 g/dL (ref 30.0–36.0)
MCV: 98.6 fL (ref 78.0–100.0)
MCV: 94.5 fL (ref 78.0–100.0)
MCV: 93.6 fL (ref 78.0–100.0)
Platelets: 180 10*3/uL (ref 150–400)
Platelets: 150 10*3/uL (ref 150–400)
Platelets: 158 10*3/uL (ref 150–400)
RBC: 2.83 MIL/uL — ABNORMAL LOW (ref 3.87–5.11)
RBC: 3.43 MIL/uL — ABNORMAL LOW (ref 3.87–5.11)
RBC: 3.92 MIL/uL (ref 3.87–5.11)
RDW: 17.2 % — ABNORMAL HIGH (ref 11.5–15.5)
RDW: 16.5 % — AB (ref 11.5–15.5)
RDW: 16.9 % — ABNORMAL HIGH (ref 11.5–15.5)
WBC: 5.8 10*3/uL (ref 4.0–10.5)
WBC: 10.7 10*3/uL — ABNORMAL HIGH (ref 4.0–10.5)
WBC: 12.9 10*3/uL — ABNORMAL HIGH (ref 4.0–10.5)

## 2017-05-30 LAB — HEMOGLOBIN AND HEMATOCRIT, BLOOD
HCT: 23 % — ABNORMAL LOW (ref 36.0–46.0)
Hemoglobin: 7.6 g/dL — ABNORMAL LOW (ref 12.0–15.0)

## 2017-05-30 LAB — BASIC METABOLIC PANEL
Anion gap: 10 (ref 5–15)
BUN: 48 mg/dL — ABNORMAL HIGH (ref 6–20)
CO2: 28 mmol/L (ref 22–32)
Calcium: 9.9 mg/dL (ref 8.9–10.3)
Chloride: 100 mmol/L — ABNORMAL LOW (ref 101–111)
Creatinine, Ser: 3.09 mg/dL — ABNORMAL HIGH (ref 0.44–1.00)
GFR calc Af Amer: 16 mL/min — ABNORMAL LOW (ref >60)
GFR calc non Af Amer: 14 mL/min — ABNORMAL LOW (ref >60)
Glucose, Bld: 100 mg/dL — ABNORMAL HIGH (ref 65–99)
Potassium: 4.6 mmol/L (ref 3.5–5.1)
Sodium: 138 mmol/L (ref 135–145)

## 2017-05-30 LAB — FIBRINOGEN: Fibrinogen: 233 mg/dL (ref 210–475)

## 2017-05-30 LAB — BLOOD GAS, ARTERIAL
Acid-Base Excess: 6.3 mmol/L — ABNORMAL HIGH (ref 0.0–2.0)
Bicarbonate: 31.4 mmol/L — ABNORMAL HIGH (ref 20.0–28.0)
Drawn by: 51191
O2 Content: 2 L/min
O2 Saturation: 96.2 %
Patient temperature: 98.6
pCO2 arterial: 55.6 mmHg — ABNORMAL HIGH (ref 32.0–48.0)
pH, Arterial: 7.37 (ref 7.350–7.450)
pO2, Arterial: 84.2 mmHg (ref 83.0–108.0)

## 2017-05-30 LAB — PLATELET COUNT: Platelets: 80 10*3/uL — ABNORMAL LOW (ref 150–400)

## 2017-05-30 LAB — URINE CULTURE
Culture: NO GROWTH
Special Requests: NORMAL

## 2017-05-30 LAB — POCT I-STAT 4, (NA,K, GLUC, HGB,HCT)
Glucose, Bld: 142 mg/dL — ABNORMAL HIGH (ref 65–99)
HCT: 31 % — ABNORMAL LOW (ref 36.0–46.0)
HEMOGLOBIN: 10.5 g/dL — AB (ref 12.0–15.0)
Potassium: 4.9 mmol/L (ref 3.5–5.1)
SODIUM: 140 mmol/L (ref 135–145)

## 2017-05-30 LAB — APTT
aPTT: 27 seconds (ref 24–36)
aPTT: 34 seconds (ref 24–36)

## 2017-05-30 LAB — HEMOGLOBIN A1C
Hgb A1c MFr Bld: 5.3 % (ref 4.8–5.6)
Mean Plasma Glucose: 105.41 mg/dL

## 2017-05-30 LAB — GLUCOSE, CAPILLARY
GLUCOSE-CAPILLARY: 143 mg/dL — AB (ref 65–99)
GLUCOSE-CAPILLARY: 127 mg/dL — AB (ref 65–99)
GLUCOSE-CAPILLARY: 119 mg/dL — AB (ref 65–99)
Glucose-Capillary: 141 mg/dL — ABNORMAL HIGH (ref 65–99)
Glucose-Capillary: 148 mg/dL — ABNORMAL HIGH (ref 65–99)
Glucose-Capillary: 143 mg/dL — ABNORMAL HIGH (ref 65–99)

## 2017-05-30 LAB — PROTIME-INR
INR: 0.83
PROTHROMBIN TIME: 11.3 s — AB (ref 11.4–15.2)

## 2017-05-30 SURGERY — REPAIR, AORTIC DISSECTION, ASCENDING
Anesthesia: General | Site: Chest

## 2017-05-30 MED ORDER — ORAL CARE MOUTH RINSE
15.0000 mL | OROMUCOSAL | Status: DC
  Administered 2017-05-30 – 2017-05-31 (×8): 15 mL via OROMUCOSAL

## 2017-05-30 MED ORDER — SODIUM CHLORIDE 0.9% FLUSH
3.0000 mL | Freq: Two times a day (BID) | INTRAVENOUS | Status: DC
  Administered 2017-05-31: 10 mL via INTRAVENOUS
  Administered 2017-05-31: 3 mL via INTRAVENOUS
  Administered 2017-06-01: 10 mL via INTRAVENOUS
  Administered 2017-06-01 – 2017-06-06 (×7): 3 mL via INTRAVENOUS
  Administered 2017-06-07: 10 mL via INTRAVENOUS
  Administered 2017-06-07 – 2017-06-08 (×2): 3 mL via INTRAVENOUS
  Administered 2017-06-08: 10 mL via INTRAVENOUS
  Administered 2017-06-09 – 2017-06-15 (×2): 3 mL via INTRAVENOUS

## 2017-05-30 MED ORDER — LEVALBUTEROL HCL 1.25 MG/0.5ML IN NEBU
1.2500 mg | INHALATION_SOLUTION | Freq: Four times a day (QID) | RESPIRATORY_TRACT | Status: DC
  Administered 2017-05-30 – 2017-06-01 (×6): 1.25 mg via RESPIRATORY_TRACT
  Filled 2017-05-30 (×6): qty 0.5

## 2017-05-30 MED ORDER — LACTATED RINGERS IV SOLN
INTRAVENOUS | Status: DC
  Administered 2017-06-01: 07:00:00 via INTRAVENOUS

## 2017-05-30 MED ORDER — ROCURONIUM BROMIDE 10 MG/ML (PF) SYRINGE
PREFILLED_SYRINGE | INTRAVENOUS | Status: AC
  Filled 2017-05-30: qty 10

## 2017-05-30 MED ORDER — METOPROLOL TARTRATE 12.5 MG HALF TABLET: 12.5000 mg | ORAL_TABLET | Freq: Two times a day (BID) | ORAL | Status: DC

## 2017-05-30 MED ORDER — MORPHINE SULFATE (PF) 4 MG/ML IV SOLN
1.0000 mg | INTRAVENOUS | Status: DC | PRN
  Filled 2017-05-30: qty 1

## 2017-05-30 MED ORDER — CHLORHEXIDINE GLUCONATE 0.12 % MT SOLN
15.0000 mL | OROMUCOSAL | Status: AC
  Administered 2017-05-30: 15 mL via OROMUCOSAL

## 2017-05-30 MED ORDER — LACTATED RINGERS IV SOLN: 500.0000 mL | Freq: Once | INTRAVENOUS | Status: DC | PRN

## 2017-05-30 MED ORDER — DOPAMINE-DEXTROSE 3.2-5 MG/ML-% IV SOLN: 0.0000 ug/kg/min | INTRAVENOUS | Status: DC

## 2017-05-30 MED ORDER — KENNESTONE BLOOD CARDIOPLEGIA (KBC) MANNITOL SYRINGE (20%, 32ML)
32.0000 mL | Freq: Once | INTRAVENOUS | Status: DC
  Filled 2017-05-30 (×3): qty 1

## 2017-05-30 MED ORDER — FENTANYL CITRATE (PF) 250 MCG/5ML IJ SOLN
INTRAMUSCULAR | Status: DC | PRN
  Administered 2017-05-30: 350 ug via INTRAVENOUS
  Administered 2017-05-30: 50 ug via INTRAVENOUS
  Administered 2017-05-30: 100 ug via INTRAVENOUS
  Administered 2017-05-30 (×2): 250 ug via INTRAVENOUS

## 2017-05-30 MED ORDER — MIDAZOLAM HCL 10 MG/2ML IJ SOLN
INTRAMUSCULAR | Status: AC
  Filled 2017-05-30: qty 2

## 2017-05-30 MED ORDER — ASPIRIN 81 MG PO CHEW
324.0000 mg | CHEWABLE_TABLET | Freq: Every day | ORAL | Status: DC
  Administered 2017-05-31: 324 mg
  Filled 2017-05-30 (×2): qty 4

## 2017-05-30 MED ORDER — PANTOPRAZOLE SODIUM 40 MG PO TBEC
40.0000 mg | DELAYED_RELEASE_TABLET | Freq: Every day | ORAL | Status: DC
  Administered 2017-06-01 – 2017-06-16 (×16): 40 mg via ORAL
  Filled 2017-05-30 (×15): qty 1

## 2017-05-30 MED ORDER — LACTATED RINGERS IV SOLN
INTRAVENOUS | Status: DC | PRN
  Administered 2017-05-30 (×7): via INTRAVENOUS

## 2017-05-30 MED ORDER — MORPHINE SULFATE (PF) 4 MG/ML IV SOLN: 2.0000 mg | INTRAVENOUS | Status: DC | PRN

## 2017-05-30 MED ORDER — HEPARIN SODIUM (PORCINE) 1000 UNIT/ML IJ SOLN
INTRAMUSCULAR | Status: DC | PRN
  Administered 2017-05-30: 2500 [IU] via INTRAVENOUS
  Administered 2017-05-30: 26500 [IU] via INTRAVENOUS
  Administered 2017-05-30: 5000 [IU] via INTRAVENOUS

## 2017-05-30 MED ORDER — METOPROLOL TARTRATE 5 MG/5ML IV SOLN
2.5000 mg | INTRAVENOUS | Status: DC | PRN
  Administered 2017-06-03 (×3): 2.5 mg via INTRAVENOUS
  Filled 2017-05-30 (×3): qty 5

## 2017-05-30 MED ORDER — NOREPINEPHRINE BITARTRATE 1 MG/ML IV SOLN
0.0000 ug/min | INTRAVENOUS | Status: DC
  Administered 2017-05-30 – 2017-05-31 (×2): 10 ug/min via INTRAVENOUS
  Administered 2017-05-31: 8 ug/min via INTRAVENOUS
  Filled 2017-05-30 (×6): qty 4

## 2017-05-30 MED ORDER — SODIUM CHLORIDE 0.9 % IV SOLN
INTRAVENOUS | Status: DC
  Filled 2017-05-30: qty 1

## 2017-05-30 MED ORDER — VANCOMYCIN HCL IN DEXTROSE 1-5 GM/200ML-% IV SOLN
1000.0000 mg | Freq: Once | INTRAVENOUS | Status: AC
  Administered 2017-05-30: 1000 mg via INTRAVENOUS
  Filled 2017-05-30: qty 200

## 2017-05-30 MED ORDER — PROPOFOL 10 MG/ML IV BOLUS
INTRAVENOUS | Status: AC
  Filled 2017-05-30: qty 20

## 2017-05-30 MED ORDER — FENTANYL CITRATE (PF) 250 MCG/5ML IJ SOLN
INTRAMUSCULAR | Status: AC
  Filled 2017-05-30: qty 25

## 2017-05-30 MED ORDER — INSULIN REGULAR BOLUS VIA INFUSION
0.0000 [IU] | Freq: Three times a day (TID) | INTRAVENOUS | Status: DC
  Filled 2017-05-30: qty 10

## 2017-05-30 MED ORDER — PROTAMINE SULFATE 10 MG/ML IV SOLN
INTRAVENOUS | Status: AC
  Filled 2017-05-30: qty 5

## 2017-05-30 MED ORDER — MIDAZOLAM HCL 2 MG/2ML IJ SOLN
2.0000 mg | INTRAMUSCULAR | Status: DC | PRN
  Administered 2017-05-30 – 2017-05-31 (×3): 2 mg via INTRAVENOUS
  Filled 2017-05-30 (×3): qty 2

## 2017-05-30 MED ORDER — SODIUM CHLORIDE 0.9 % IV SOLN: 250.0000 mL | INTRAVENOUS | Status: DC

## 2017-05-30 MED ORDER — DEXMEDETOMIDINE HCL IN NACL 200 MCG/50ML IV SOLN
0.0000 ug/kg/h | INTRAVENOUS | Status: DC
  Administered 2017-05-30 – 2017-05-31 (×6): 0.7 ug/kg/h via INTRAVENOUS
  Filled 2017-05-30 (×2): qty 50
  Filled 2017-05-30: qty 100
  Filled 2017-05-30 (×2): qty 50

## 2017-05-30 MED ORDER — 0.9 % SODIUM CHLORIDE (POUR BTL) OPTIME
TOPICAL | Status: DC | PRN
  Administered 2017-05-30: 3000 mL
  Administered 2017-05-30: 5000 mL

## 2017-05-30 MED ORDER — LEVALBUTEROL HCL 1.25 MG/0.5ML IN NEBU: 1.2500 mg | INHALATION_SOLUTION | Freq: Four times a day (QID) | RESPIRATORY_TRACT | Status: DC

## 2017-05-30 MED ORDER — HEPARIN SODIUM (PORCINE) 1000 UNIT/ML IJ SOLN
INTRAMUSCULAR | Status: AC
  Filled 2017-05-30: qty 1

## 2017-05-30 MED ORDER — MAGNESIUM SULFATE 4 GM/100ML IV SOLN: 4.0000 g | Freq: Once | INTRAVENOUS | Status: DC

## 2017-05-30 MED ORDER — CEFUROXIME SODIUM 750 MG IJ SOLR
INTRAMUSCULAR | Status: DC | PRN
  Administered 2017-05-30: 750 mg via INTRAVENOUS

## 2017-05-30 MED ORDER — SODIUM CHLORIDE 0.9 % IV SOLN
1.5000 g | Freq: Two times a day (BID) | INTRAVENOUS | Status: DC
  Administered 2017-05-30: 1.5 g via INTRAVENOUS
  Filled 2017-05-30 (×2): qty 1.5

## 2017-05-30 MED ORDER — ACETAMINOPHEN 160 MG/5ML PO SOLN: 1000.0000 mg | Freq: Four times a day (QID) | ORAL | Status: DC

## 2017-05-30 MED ORDER — DOCUSATE SODIUM 100 MG PO CAPS
200.0000 mg | ORAL_CAPSULE | Freq: Every day | ORAL | Status: DC
  Administered 2017-06-01 – 2017-06-15 (×11): 200 mg via ORAL
  Filled 2017-05-30 (×13): qty 2

## 2017-05-30 MED ORDER — KENNESTONE BLOOD CARDIOPLEGIA VIAL
13.0000 mL | Freq: Once | Status: DC
  Filled 2017-05-30: qty 1

## 2017-05-30 MED ORDER — OXYCODONE HCL 5 MG PO TABS: 5.0000 mg | ORAL_TABLET | ORAL | Status: DC | PRN

## 2017-05-30 MED ORDER — FAMOTIDINE 20 MG IN NS 100 ML IVPB
20.0000 mg | Freq: Two times a day (BID) | INTRAVENOUS | Status: AC
  Administered 2017-05-30 – 2017-05-31 (×2): 20 mg via INTRAVENOUS
  Filled 2017-05-30 (×2): qty 100

## 2017-05-30 MED ORDER — SODIUM CHLORIDE 0.9 % IJ SOLN
OROMUCOSAL | Status: DC | PRN
  Administered 2017-05-30 (×4): 4 mL via TOPICAL

## 2017-05-30 MED ORDER — ALBUMIN HUMAN 5 % IV SOLN
250.0000 mL | INTRAVENOUS | Status: AC | PRN
  Administered 2017-05-30 – 2017-05-31 (×2): 250 mL via INTRAVENOUS

## 2017-05-30 MED ORDER — BISACODYL 5 MG PO TBEC
10.0000 mg | DELAYED_RELEASE_TABLET | Freq: Every day | ORAL | Status: DC
  Administered 2017-06-01 – 2017-06-15 (×9): 10 mg via ORAL
  Filled 2017-05-30 (×12): qty 2

## 2017-05-30 MED ORDER — ONDANSETRON HCL 4 MG/2ML IJ SOLN
4.0000 mg | Freq: Four times a day (QID) | INTRAMUSCULAR | Status: DC | PRN
  Administered 2017-06-10 (×2): 4 mg via INTRAVENOUS
  Filled 2017-05-30 (×2): qty 2

## 2017-05-30 MED ORDER — EPINEPHRINE PF 1 MG/ML IJ SOLN
0.0000 ug/min | INTRAVENOUS | Status: DC
  Filled 2017-05-30: qty 4

## 2017-05-30 MED ORDER — METOPROLOL TARTRATE 25 MG/10 ML ORAL SUSPENSION
12.5000 mg | Freq: Two times a day (BID) | ORAL | Status: DC
  Administered 2017-05-31: 12.5 mg
  Filled 2017-05-30: qty 5

## 2017-05-30 MED ORDER — SODIUM CHLORIDE 0.9 % IV SOLN
INTRAVENOUS | Status: DC
  Administered 2017-05-30 – 2017-05-31 (×2): via INTRAVENOUS

## 2017-05-30 MED ORDER — HEMOSTATIC AGENTS (NO CHARGE) OPTIME
TOPICAL | Status: DC | PRN
  Administered 2017-05-30 (×2): 2 via TOPICAL
  Administered 2017-05-30: 1 via TOPICAL

## 2017-05-30 MED ORDER — SODIUM CHLORIDE 0.9% FLUSH
3.0000 mL | INTRAVENOUS | Status: DC | PRN
  Administered 2017-05-31 (×2): 3 mL via INTRAVENOUS
  Filled 2017-05-30 (×2): qty 3

## 2017-05-30 MED ORDER — ACETAMINOPHEN 650 MG RE SUPP
650.0000 mg | Freq: Once | RECTAL | Status: AC
  Administered 2017-05-30: 650 mg via RECTAL

## 2017-05-30 MED ORDER — LIDOCAINE 2% (20 MG/ML) 5 ML SYRINGE
INTRAMUSCULAR | Status: DC | PRN
  Administered 2017-05-30: 150 mg via INTRAVENOUS

## 2017-05-30 MED ORDER — PROTAMINE SULFATE 10 MG/ML IV SOLN
INTRAVENOUS | Status: AC
  Filled 2017-05-30: qty 25

## 2017-05-30 MED ORDER — SODIUM CHLORIDE 0.9 % IV SOLN
1.5000 g | INTRAVENOUS | Status: AC
  Administered 2017-05-31: 1.5 g via INTRAVENOUS
  Filled 2017-05-30: qty 1.5

## 2017-05-30 MED ORDER — CHLORHEXIDINE GLUCONATE 0.12% ORAL RINSE (MEDLINE KIT)
15.0000 mL | Freq: Two times a day (BID) | OROMUCOSAL | Status: DC
  Administered 2017-05-31: 15 mL via OROMUCOSAL

## 2017-05-30 MED ORDER — ASPIRIN EC 325 MG PO TBEC
325.0000 mg | DELAYED_RELEASE_TABLET | Freq: Every day | ORAL | Status: DC
  Administered 2017-06-01 – 2017-06-16 (×16): 325 mg via ORAL
  Filled 2017-05-30 (×16): qty 1

## 2017-05-30 MED ORDER — HEPARIN SODIUM (PORCINE) 1000 UNIT/ML IJ SOLN
INTRAMUSCULAR | Status: AC
  Filled 2017-05-30: qty 2

## 2017-05-30 MED ORDER — LACTATED RINGERS IV SOLN: INTRAVENOUS | Status: DC

## 2017-05-30 MED ORDER — MOMETASONE FURO-FORMOTEROL FUM 200-5 MCG/ACT IN AERO
2.0000 | INHALATION_SPRAY | Freq: Two times a day (BID) | RESPIRATORY_TRACT | Status: DC
Start: 2017-05-30 — End: 2017-06-16
  Administered 2017-05-31 – 2017-06-16 (×30): 2 via RESPIRATORY_TRACT
  Filled 2017-05-30 (×3): qty 8.8

## 2017-05-30 MED ORDER — ACETAMINOPHEN 500 MG PO TABS
1000.0000 mg | ORAL_TABLET | Freq: Four times a day (QID) | ORAL | Status: AC
  Administered 2017-05-31 – 2017-06-02 (×9): 1000 mg via ORAL
  Filled 2017-05-30 (×11): qty 2

## 2017-05-30 MED ORDER — GABAPENTIN 300 MG PO CAPS
300.0000 mg | ORAL_CAPSULE | Freq: Three times a day (TID) | ORAL | Status: DC | PRN
  Administered 2017-06-01: 300 mg via ORAL
  Filled 2017-05-30: qty 1

## 2017-05-30 MED ORDER — SODIUM CHLORIDE 0.45 % IV SOLN
INTRAVENOUS | Status: DC | PRN
  Administered 2017-05-30: 17:00:00 via INTRAVENOUS

## 2017-05-30 MED ORDER — HYDROCORTISONE NA SUCCINATE PF 100 MG IJ SOLR
INTRAMUSCULAR | Status: DC | PRN
  Administered 2017-05-30: 125 mg via INTRAVENOUS

## 2017-05-30 MED ORDER — CHLORHEXIDINE GLUCONATE 0.12 % MT SOLN
OROMUCOSAL | Status: AC
  Filled 2017-05-30: qty 15

## 2017-05-30 MED ORDER — PHENYLEPHRINE 40 MCG/ML (10ML) SYRINGE FOR IV PUSH (FOR BLOOD PRESSURE SUPPORT)
PREFILLED_SYRINGE | INTRAVENOUS | Status: AC
  Filled 2017-05-30: qty 20

## 2017-05-30 MED ORDER — METOCLOPRAMIDE HCL 5 MG/ML IJ SOLN
10.0000 mg | Freq: Four times a day (QID) | INTRAMUSCULAR | Status: DC
  Administered 2017-05-30 – 2017-06-07 (×22): 10 mg via INTRAVENOUS
  Filled 2017-05-30 (×23): qty 2

## 2017-05-30 MED ORDER — POTASSIUM CHLORIDE 10 MEQ/50ML IV SOLN: 10.0000 meq | INTRAVENOUS | Status: AC

## 2017-05-30 MED ORDER — SODIUM CHLORIDE 0.9 % IV SOLN
30.0000 ug/min | INTRAVENOUS | Status: DC
  Administered 2017-05-30: 50 ug/min via INTRAVENOUS
  Administered 2017-05-31: 20 ug/min via INTRAVENOUS
  Filled 2017-05-30: qty 2
  Filled 2017-05-30: qty 20
  Filled 2017-05-30: qty 2

## 2017-05-30 MED ORDER — SODIUM CHLORIDE 0.9 % IV SOLN
INTRAVENOUS | Status: DC | PRN
  Administered 2017-05-30 (×3): via INTRAVENOUS

## 2017-05-30 MED ORDER — COAGULATION FACTOR VIIA RECOMB 1 MG IV SOLR
45.0000 ug/kg | Freq: Once | INTRAVENOUS | Status: AC
  Administered 2017-05-30: 4 mg via INTRAVENOUS
  Filled 2017-05-30: qty 4

## 2017-05-30 MED ORDER — TRAMADOL HCL 50 MG PO TABS: 50.0000 mg | ORAL_TABLET | ORAL | Status: DC | PRN

## 2017-05-30 MED ORDER — KENNESTONE BLOOD CARDIOPLEGIA VIAL
13.0000 mL | Freq: Once | Status: DC
  Filled 2017-05-30 (×3): qty 1

## 2017-05-30 MED ORDER — NOREPINEPHRINE BITARTRATE 1 MG/ML IV SOLN
0.0000 ug/min | INTRAVENOUS | Status: DC
  Administered 2017-05-30: 4 ug/min via INTRAVENOUS
  Filled 2017-05-30: qty 4

## 2017-05-30 MED ORDER — PROPOFOL 10 MG/ML IV BOLUS
INTRAVENOUS | Status: DC | PRN
  Administered 2017-05-30: 150 mg via INTRAVENOUS
  Administered 2017-05-30: 250 mg via INTRAVENOUS

## 2017-05-30 MED ORDER — PROTAMINE SULFATE 10 MG/ML IV SOLN
INTRAVENOUS | Status: DC | PRN
  Administered 2017-05-30: 280 mg via INTRAVENOUS

## 2017-05-30 MED ORDER — ROCURONIUM BROMIDE 10 MG/ML (PF) SYRINGE
PREFILLED_SYRINGE | INTRAVENOUS | Status: DC | PRN
  Administered 2017-05-30 (×2): 50 mg via INTRAVENOUS
  Administered 2017-05-30: 100 mg via INTRAVENOUS

## 2017-05-30 MED ORDER — DIPHENHYDRAMINE HCL 50 MG/ML IJ SOLN
INTRAMUSCULAR | Status: DC | PRN
  Administered 2017-05-30: 25 mg via INTRAVENOUS

## 2017-05-30 MED ORDER — MILRINONE LACTATE IN DEXTROSE 20-5 MG/100ML-% IV SOLN
0.3000 ug/kg/min | INTRAVENOUS | Status: DC
  Administered 2017-05-31 (×2): 0.3 ug/kg/min via INTRAVENOUS
  Filled 2017-05-30 (×2): qty 100

## 2017-05-30 MED ORDER — ACETAMINOPHEN 160 MG/5ML PO SOLN: 650.0000 mg | Freq: Once | ORAL | Status: AC

## 2017-05-30 MED ORDER — HYDROCORTISONE NA SUCCINATE PF 250 MG IJ SOLR
INTRAMUSCULAR | Status: AC
  Filled 2017-05-30: qty 250

## 2017-05-30 MED ORDER — KENNESTONE BLOOD CARDIOPLEGIA (KBC) MANNITOL SYRINGE (20%, 32ML)
32.0000 mL | Freq: Once | INTRAVENOUS | Status: DC
  Filled 2017-05-30: qty 1

## 2017-05-30 MED ORDER — SODIUM CHLORIDE 0.9 % IV SOLN
10.0000 mL/h | Freq: Once | INTRAVENOUS | Status: DC
Start: 2017-05-30 — End: 2017-05-30

## 2017-05-30 MED ORDER — FUROSEMIDE 10 MG/ML IJ SOLN
40.0000 mg | Freq: Once | INTRAMUSCULAR | Status: AC
  Administered 2017-05-30: 40 mg via INTRAVENOUS
  Filled 2017-05-30: qty 4

## 2017-05-30 MED ORDER — BISACODYL 10 MG RE SUPP
10.0000 mg | Freq: Every day | RECTAL | Status: DC
  Administered 2017-05-31: 10 mg via RECTAL
  Filled 2017-05-30 (×3): qty 1

## 2017-05-30 MED ORDER — MIDAZOLAM HCL 5 MG/5ML IJ SOLN
INTRAMUSCULAR | Status: DC | PRN
  Administered 2017-05-30: 1 mg via INTRAVENOUS
  Administered 2017-05-30: 5 mg via INTRAVENOUS
  Administered 2017-05-30: 4 mg via INTRAVENOUS

## 2017-05-30 MED ORDER — PHENYLEPHRINE 40 MCG/ML (10ML) SYRINGE FOR IV PUSH (FOR BLOOD PRESSURE SUPPORT)
PREFILLED_SYRINGE | INTRAVENOUS | Status: DC | PRN
  Administered 2017-05-30 (×7): 80 ug via INTRAVENOUS

## 2017-05-30 SURGICAL SUPPLY — 111 items
ADAPTER CARDIO PERF ANTE/RETRO (ADAPTER) ×4 IMPLANT
ADH SRG 12 PREFL SYR 3 SPRDR (MISCELLANEOUS) ×2
ADPR PRFSN 84XANTGRD RTRGD (ADAPTER) ×2
APL SRG 7X2 LUM MLBL SLNT (VASCULAR PRODUCTS) ×8
APPLICATOR TIP BIOGLUE STANDRD (MISCELLANEOUS) ×2 IMPLANT
APPLICATOR TIP COSEAL (VASCULAR PRODUCTS) ×8 IMPLANT
ATTRACTOMAT 16X20 MAGNETIC DRP (DRAPES) ×4 IMPLANT
BAG DECANTER FOR FLEXI CONT (MISCELLANEOUS) ×4 IMPLANT
BANDAGE HEMOSTAT MRDH 4X4 STRL (MISCELLANEOUS) IMPLANT
BLADE STERNUM SYSTEM 6 (BLADE) ×4 IMPLANT
BLADE SURG 15 STRL LF DISP TIS (BLADE) ×2 IMPLANT
BLADE SURG 15 STRL SS (BLADE) ×4
BNDG HEMOSTAT MRDH 4X4 STRL (MISCELLANEOUS) ×4
CANISTER SUCT 3000ML PPV (MISCELLANEOUS) ×4 IMPLANT
CANNULA AORTIC ROOT 9FR (CANNULA) ×2 IMPLANT
CANNULA GRAFT 8MMX50CM (Graft) ×2 IMPLANT
CANNULA GUNDRY RCSP 15FR (MISCELLANEOUS) ×4 IMPLANT
CANNULA MALLEABLE SINGLE 40FR (CANNULA) IMPLANT
CANNULA MC2 2 STG 36/46 NON-V (CANNULA) IMPLANT
CANNULA SUMP PERICARDIAL (CANNULA) ×2 IMPLANT
CANNULA VENOUS 2 STG 34/46 (CANNULA) ×2
CATH ROBINSON RED A/P 18FR (CATHETERS) ×12 IMPLANT
CATH THORACIC 36FR (CATHETERS) ×4 IMPLANT
CATH THORACIC 36FR RT ANG (CATHETERS) ×4 IMPLANT
CATH/SQUID NICHOLS JEHLE COR (CATHETERS) ×2 IMPLANT
CAUTERY HIGH TEMP VAS (MISCELLANEOUS) ×4 IMPLANT
CAUTERY SURG HI TEMP FINE TIP (MISCELLANEOUS) ×2 IMPLANT
CONT SPEC 4OZ CLIKSEAL STRL BL (MISCELLANEOUS) ×6 IMPLANT
COUNTER NEEDLE 20 DBL MAG RED (NEEDLE) ×2 IMPLANT
COVER SURGICAL LIGHT HANDLE (MISCELLANEOUS) ×8 IMPLANT
CRADLE DONUT ADULT HEAD (MISCELLANEOUS) ×4 IMPLANT
DRAPE SLUSH/WARMER DISC (DRAPES) ×2 IMPLANT
DRSG AQUACEL AG ADV 3.5X 4 (GAUZE/BANDAGES/DRESSINGS) ×2 IMPLANT
DRSG COVADERM 4X14 (GAUZE/BANDAGES/DRESSINGS) ×4 IMPLANT
DRSG COVADERM 4X8 (GAUZE/BANDAGES/DRESSINGS) ×2 IMPLANT
DRSG EMULSION OIL 3X3 NADH (GAUZE/BANDAGES/DRESSINGS) ×2 IMPLANT
DRSG TEGADERM 4X4.75 (GAUZE/BANDAGES/DRESSINGS) ×2 IMPLANT
ELECT CAUTERY BLADE 6.4 (BLADE) ×4 IMPLANT
ELECT REM PT RETURN 9FT ADLT (ELECTROSURGICAL) ×8
ELECTRODE REM PT RTRN 9FT ADLT (ELECTROSURGICAL) ×4 IMPLANT
FELT TEFLON 6X6 (MISCELLANEOUS) ×2 IMPLANT
GAUZE SPONGE 4X4 12PLY STRL (GAUZE/BANDAGES/DRESSINGS) ×4 IMPLANT
GAUZE SPONGE 4X4 12PLY STRL LF (GAUZE/BANDAGES/DRESSINGS) ×2 IMPLANT
GLOVE BIO SURGEON STRL SZ 6 (GLOVE) IMPLANT
GLOVE BIO SURGEON STRL SZ 6.5 (GLOVE) IMPLANT
GLOVE BIO SURGEON STRL SZ7 (GLOVE) IMPLANT
GLOVE BIO SURGEON STRL SZ7.5 (GLOVE) ×8 IMPLANT
GLOVE BIO SURGEONS STRL SZ 6.5 (GLOVE)
GLOVE EUDERMIC 7 POWDERFREE (GLOVE) ×8 IMPLANT
GLOVE SURG SS PI 6.0 STRL IVOR (GLOVE) ×2 IMPLANT
GOWN STRL REUS W/ TWL LRG LVL3 (GOWN DISPOSABLE) ×8 IMPLANT
GOWN STRL REUS W/ TWL XL LVL3 (GOWN DISPOSABLE) ×2 IMPLANT
GOWN STRL REUS W/TWL LRG LVL3 (GOWN DISPOSABLE) ×16
GOWN STRL REUS W/TWL XL LVL3 (GOWN DISPOSABLE) ×8
GRAFT WOVEN D/V 28DX30L (Vascular Products) ×2 IMPLANT
HANDLE STAPLE ENDO GIA SHORT (STAPLE) ×2
HEART VENT LT CURVED (MISCELLANEOUS) ×4 IMPLANT
HEMOSTAT POWDER KIT SURGIFOAM (HEMOSTASIS) ×8 IMPLANT
HEMOSTAT POWDER SURGIFOAM 1G (HEMOSTASIS) ×12 IMPLANT
HEMOSTAT SURGICEL 2X14 (HEMOSTASIS) ×8 IMPLANT
KIT BASIN OR (CUSTOM PROCEDURE TRAY) ×4 IMPLANT
KIT ROOM TURNOVER OR (KITS) ×4 IMPLANT
KIT SUCTION CATH 14FR (SUCTIONS) ×6 IMPLANT
LINE VENT (MISCELLANEOUS) ×2 IMPLANT
LOOP VESSEL MAXI BLUE (MISCELLANEOUS) ×4 IMPLANT
NS IRRIG 1000ML POUR BTL (IV SOLUTION) ×20 IMPLANT
PACK E OPEN HEART (SUTURE) ×4 IMPLANT
PACK OPEN HEART (CUSTOM PROCEDURE TRAY) ×4 IMPLANT
PAD ARMBOARD 7.5X6 YLW CONV (MISCELLANEOUS) ×8 IMPLANT
SEALANT SURG COSEAL 8ML (VASCULAR PRODUCTS) ×2 IMPLANT
SET CARDIOPLEGIA MPS 5001102 (MISCELLANEOUS) ×2 IMPLANT
SHEATH PINNACLE 5F 10CM (SHEATH) ×2 IMPLANT
SPONGE LAP 18X18 X RAY DECT (DISPOSABLE) ×6 IMPLANT
STAPLER ENDO GIA 12 SHRT THIN (STAPLE) IMPLANT
STAPLER ENDO GIA 12MM SHORT (STAPLE) ×2 IMPLANT
STAPLER ENDO GIA 30 3.5 (STAPLE) ×2 IMPLANT
STAPLER VISISTAT 35W (STAPLE) ×2 IMPLANT
SUT BONE WAX W31G (SUTURE) ×6 IMPLANT
SUT ETHIBON 2 0 V 52N 30 (SUTURE) ×12 IMPLANT
SUT ETHIBON EXCEL 2-0 V-5 (SUTURE) ×4 IMPLANT
SUT ETHIBOND V-5 VALVE (SUTURE) ×4 IMPLANT
SUT PROLENE 3 0 RB 1 (SUTURE) ×2 IMPLANT
SUT PROLENE 3 0 SH 1 (SUTURE) ×2 IMPLANT
SUT PROLENE 3 0 SH DA (SUTURE) ×4 IMPLANT
SUT PROLENE 4 0 RB 1 (SUTURE) ×200
SUT PROLENE 4 0 SH DA (SUTURE) ×14 IMPLANT
SUT PROLENE 4-0 RB1 .5 CRCL 36 (SUTURE) ×8 IMPLANT
SUT PROLENE 5 0 RB 2 (SUTURE) ×4 IMPLANT
SUT PROLENE 6 0 C 1 30 (SUTURE) ×16 IMPLANT
SUT PROLENE 6 0 CC (SUTURE) ×10 IMPLANT
SUT SILK 2 0 SH CR/8 (SUTURE) ×2 IMPLANT
SUT STEEL 6MS V (SUTURE) ×2 IMPLANT
SUT STEEL STERNAL CCS#1 18IN (SUTURE) IMPLANT
SUT STEEL SZ 6 DBL 3X14 BALL (SUTURE) ×2 IMPLANT
SUT VIC AB 1 CTX 18 (SUTURE) ×2 IMPLANT
SUT VIC AB 1 CTX 36 (SUTURE) ×12
SUT VIC AB 1 CTX36XBRD ANBCTR (SUTURE) ×4 IMPLANT
SUT VIC AB 2-0 CT1 27 (SUTURE)
SUT VIC AB 2-0 CT1 TAPERPNT 27 (SUTURE) IMPLANT
SUT VIC AB 2-0 CTX 27 (SUTURE) ×2 IMPLANT
SUT VIC AB 3-0 X1 27 (SUTURE) ×4 IMPLANT
SYR 10ML KIT SKIN ADHESIVE (MISCELLANEOUS) ×2 IMPLANT
SYSTEM SAHARA CHEST DRAIN ATS (WOUND CARE) ×4 IMPLANT
TAPE CLOTH SURG 6X10 WHT LF (GAUZE/BANDAGES/DRESSINGS) ×2 IMPLANT
TAPE PAPER 3X10 WHT MICROPORE (GAUZE/BANDAGES/DRESSINGS) ×2 IMPLANT
TOWEL GREEN STERILE (TOWEL DISPOSABLE) ×4 IMPLANT
TOWEL GREEN STERILE FF (TOWEL DISPOSABLE) ×4 IMPLANT
TRAY FOLEY SILVER 14FR TEMP (SET/KITS/TRAYS/PACK) ×4 IMPLANT
UNDERPAD 30X30 (UNDERPADS AND DIAPERS) ×4 IMPLANT
VALVE MAGNA EASE 21MM (Prosthesis & Implant Heart) ×2 IMPLANT
WATER STERILE IRR 1000ML POUR (IV SOLUTION) ×8 IMPLANT

## 2017-05-30 NOTE — Progress Notes (Signed)
  Echocardiogram Echocardiogram Transesophageal has been performed.  [name] 05/30/2017, 9:26 AM

## 2017-05-30 NOTE — Progress Notes (Signed)
TCTS BRIEF SICU PROGRESS NOTE  Day of Surgery  S/P Procedure(s) (LRB): REPLACEMENT OF ASCENDING AORTIC ANEURYSM AND REPAIRED CHRONIC ROOT DISSECTION WITH CIRC ARREST (N/A) TRANSESOPHAGEAL ECHOCARDIOGRAM (TEE) (N/A) AORTIC VALVE REPLACEMENT (AVR) (N/A)   Sedated on vent NSR w/ stable hemodynamics on Epi 3 Norepi 5 Milrinone 0.25 O2 sats 100% Chest tube output low UOP adequate Labs okay Hgb 8.8  Plan: Continue routine early postop  Rexene Alberts, MD 05/30/2017 6:40 PM

## 2017-05-30 NOTE — Anesthesia Procedure Notes (Signed)
Arterial Line Insertion Performed by: Valda Favia, CRNA, CRNA  Preanesthetic checklist: patient identified, IV checked, site marked, risks and benefits discussed, surgical consent, monitors and equipment checked, pre-op evaluation, timeout performed and anesthesia consent Lidocaine 1% used for infiltration and patient sedated Left, radial was placed Catheter size: 20 G Hand hygiene performed , maximum sterile barriers used  and Seldinger technique used Allen's test indicative of satisfactory collateral circulation Attempts: 1 Procedure performed without using ultrasound guided technique. Following insertion, dressing applied and Biopatch. Post procedure assessment: normal  Patient tolerated the procedure well with no immediate complications.

## 2017-05-30 NOTE — Anesthesia Procedure Notes (Signed)
Central Venous Catheter Insertion Performed by: Lillia Abed, MD, anesthesiologist Patient location: Pre-op. Preanesthetic checklist: patient identified, IV checked, risks and benefits discussed, surgical consent, monitors and equipment checked, pre-op evaluation, timeout performed and anesthesia consent Position: Trendelenburg Lidocaine 1% used for infiltration and patient sedated Hand hygiene performed  and maximum sterile barriers used  Catheter size: 8.5 Fr Central line and PA cath was placed.Sheath introducer Swan type:thermodilution Procedure performed using ultrasound guided technique. Ultrasound Notes:anatomy identified, needle tip was noted to be adjacent to the nerve/plexus identified, no ultrasound evidence of intravascular and/or intraneural injection and image(s) printed for medical record Attempts: 1 Following insertion, line sutured and dressing applied. Post procedure assessment: blood return through all ports, free fluid flow and no air  Patient tolerated the procedure well with no immediate complications.

## 2017-05-30 NOTE — Transfer of Care (Signed)
Immediate Anesthesia Transfer of Care Note  Patient: [name]  Procedure(s) Performed: REPLACEMENT OF ASCENDING AORTIC ANEURYSM AND REPAIRED CHRONIC ROOT DISSECTION WITH CIRC ARREST (N/A Chest) AORTIC VALVE REPLACEMENT (AVR) (N/A Chest) TRANSESOPHAGEAL ECHOCARDIOGRAM (TEE) (N/A )  Patient Location: ICU  Anesthesia Type:General  Level of Consciousness: sedated and Patient remains intubated per anesthesia plan  Airway & Oxygen Therapy: Patient remains intubated per anesthesia plan and Patient placed on Ventilator (see vital sign flow sheet for setting)  Post-op Assessment: Report given to RN and Post -op Vital signs reviewed and stable  Post vital signs: Reviewed and stable  Last Vitals:  Vitals:   05/30/17 0400 05/30/17 0500  BP: (!) 138/45 (!) 153/58  Pulse: 72 78  Resp: 16 15  Temp:    SpO2: 99% 99%    Last Pain:  Vitals:   05/29/17 2300  TempSrc: Oral  PainSc: 0-No pain         Complications: No apparent anesthesia complications

## 2017-05-30 NOTE — Progress Notes (Signed)
Pre Procedure note for inpatients:   [name] has been scheduled for Procedure(s): REPAIR OF ACUTE ASCENDING THORACIC AORTIC DISSECTION WITH CIRC ARREST (N/A) TRANSESOPHAGEAL ECHOCARDIOGRAM (TEE) (N/A) today. The various methods of treatment have been discussed with the patient. After consideration of the risks, benefits and treatment options the patient has consented to the planned procedure.   The patient has been seen and labs reviewed. There are no changes in the patient's condition to prevent proceeding with the planned procedure today.  Recent labs:  Lab Results  Component Value Date   WBC 5.8 05/30/2017   HGB 8.7 (L) 05/30/2017   HCT 27.9 (L) 05/30/2017   PLT 180 05/30/2017   GLUCOSE 100 (H) 05/30/2017   CHOL 141 02/04/2017   TRIG 78 02/04/2017   HDL 49 02/04/2017   LDLCALC 76 02/04/2017   ALT 36 05/29/2017   AST 46 (H) 05/29/2017   NA 138 05/30/2017   K 4.6 05/30/2017   CL 100 (L) 05/30/2017   CREATININE 3.09 (H) 05/30/2017   BUN 48 (H) 05/30/2017   CO2 28 05/30/2017   TSH 0.703 05/29/2017   INR 1.09 05/29/2017   HGBA1C 5.3 05/30/2017    Len Childs, MD 05/30/2017 7:19 AM

## 2017-05-30 NOTE — Brief Op Note (Signed)
05/22/2017 - 05/30/2017  4:49 PM  PATIENT:  [name]  72 y.o. female  PRE-OPERATIVE DIAGNOSIS:  aortic dissection  POST-OPERATIVE DIAGNOSIS:  * No post-op diagnosis entered *  PROCEDURE:  Procedure(s): AORTIC VALVE REPLACEMENT REPAIR OF CHRONIC ROOT DISSECTION  REPLACEMENT OF ASCENDING AORTIC ANEURYSM  SURGEON:  Surgeon(s) and Role: Panel 1:    * Ivin Poot, MD - Primary Panel 2:    * Ivin Poot, MD - Primary  PHYSICIAN ASSISTANT:  Nicholes Rough, PA-C   ANESTHESIA:   general  EBL:  1295 mL   BLOOD ADMINISTERED:Dragon Dictation  DRAINS: ROUTINE   LOCAL MEDICATIONS USED:  NONE  SPECIMEN:  Source of Specimen:  AORTIC VALVE LEAFLETS AND PORTION OF THE ASCENDING AORTA  DISPOSITION OF SPECIMEN:  PATHOLOGY  COUNTS:  YES  DICTATION: .Dragon Dictation  PLAN OF CARE: Admit to inpatient   PATIENT DISPOSITION:  ICU - intubated and hemodynamically stable.   Delay start of Pharmacological VTE agent (>24hrs) due to surgical blood loss or risk of bleeding: yes

## 2017-05-30 NOTE — Anesthesia Procedure Notes (Signed)
Procedure Name: Intubation Date/Time: 05/30/2017 8:15 AM Performed by: Valda Favia, CRNA Pre-anesthesia Checklist: Patient identified, Emergency Drugs available, Suction available, Patient being monitored and Timeout performed Patient Re-evaluated:Patient Re-evaluated prior to induction Oxygen Delivery Method: Circle system utilized Preoxygenation: Pre-oxygenation with 100% oxygen Induction Type: IV induction Ventilation: Mask ventilation without difficulty and Oral airway inserted - appropriate to patient size Laryngoscope Size: Glidescope and 4 Grade View: Grade II Tube type: Oral Tube size: 8.0 mm Number of attempts: 1 Airway Equipment and Method: Stylet and Video-laryngoscopy Placement Confirmation: ETT inserted through vocal cords under direct vision,  positive ETCO2 and breath sounds checked- equal and bilateral Secured at: 21 cm Tube secured with: Tape Dental Injury: Teeth and Oropharynx as per pre-operative assessment

## 2017-05-31 ENCOUNTER — Inpatient Hospital Stay (HOSPITAL_COMMUNITY): Payer: Medicare Other

## 2017-05-31 LAB — BPAM CRYOPRECIPITATE
BLOOD PRODUCT EXPIRATION DATE: 201902221917
Blood Product Expiration Date: 201902221917
ISSUE DATE / TIME: 201902221330
ISSUE DATE / TIME: 201902221330
UNIT TYPE AND RH: 6200
Unit Type and Rh: 6200

## 2017-05-31 LAB — BASIC METABOLIC PANEL
ANION GAP: 7 (ref 5–15)
Anion gap: 7 (ref 5–15)
BUN: 43 mg/dL — ABNORMAL HIGH (ref 6–20)
BUN: 43 mg/dL — ABNORMAL HIGH (ref 6–20)
CHLORIDE: 107 mmol/L (ref 101–111)
CO2: 23 mmol/L (ref 22–32)
CO2: 23 mmol/L (ref 22–32)
Calcium: 8.8 mg/dL — ABNORMAL LOW (ref 8.9–10.3)
Calcium: 9 mg/dL (ref 8.9–10.3)
Chloride: 106 mmol/L (ref 101–111)
Creatinine, Ser: 2.53 mg/dL — ABNORMAL HIGH (ref 0.44–1.00)
Creatinine, Ser: 2.62 mg/dL — ABNORMAL HIGH (ref 0.44–1.00)
GFR calc Af Amer: 21 mL/min — ABNORMAL LOW (ref >60)
GFR calc non Af Amer: 17 mL/min — ABNORMAL LOW (ref >60)
GFR calc non Af Amer: 18 mL/min — ABNORMAL LOW (ref >60)
GFR, EST AFRICAN AMERICAN: 20 mL/min — AB (ref >60)
Glucose, Bld: 121 mg/dL — ABNORMAL HIGH (ref 65–99)
Glucose, Bld: 96 mg/dL (ref 65–99)
POTASSIUM: 5 mmol/L (ref 3.5–5.1)
Potassium: 5.4 mmol/L — ABNORMAL HIGH (ref 3.5–5.1)
SODIUM: 137 mmol/L (ref 135–145)
Sodium: 136 mmol/L (ref 135–145)

## 2017-05-31 LAB — POCT I-STAT 3, ART BLOOD GAS (G3+)
Acid-base deficit: 2 mmol/L (ref 0.0–2.0)
BICARBONATE: 22.3 mmol/L (ref 20.0–28.0)
O2 Saturation: 95 %
PCO2 ART: 37.8 mmHg (ref 32.0–48.0)
PH ART: 7.381 (ref 7.350–7.450)
PO2 ART: 78 mmHg — AB (ref 83.0–108.0)
Patient temperature: 99
TCO2: 23 mmol/L (ref 22–32)

## 2017-05-31 LAB — GLUCOSE, CAPILLARY
GLUCOSE-CAPILLARY: 101 mg/dL — AB (ref 65–99)
GLUCOSE-CAPILLARY: 101 mg/dL — AB (ref 65–99)
GLUCOSE-CAPILLARY: 104 mg/dL — AB (ref 65–99)
GLUCOSE-CAPILLARY: 105 mg/dL — AB (ref 65–99)
GLUCOSE-CAPILLARY: 105 mg/dL — AB (ref 65–99)
GLUCOSE-CAPILLARY: 127 mg/dL — AB (ref 65–99)
GLUCOSE-CAPILLARY: 73 mg/dL (ref 65–99)
GLUCOSE-CAPILLARY: 94 mg/dL (ref 65–99)
Glucose-Capillary: 113 mg/dL — ABNORMAL HIGH (ref 65–99)
Glucose-Capillary: 93 mg/dL (ref 65–99)
Glucose-Capillary: 95 mg/dL (ref 65–99)
Glucose-Capillary: 118 mg/dL — ABNORMAL HIGH (ref 65–99)
Glucose-Capillary: 112 mg/dL — ABNORMAL HIGH (ref 65–99)
Glucose-Capillary: 102 mg/dL — ABNORMAL HIGH (ref 65–99)
Glucose-Capillary: 99 mg/dL (ref 65–99)

## 2017-05-31 LAB — BPAM PLATELET PHERESIS
BLOOD PRODUCT EXPIRATION DATE: 201902222359
BLOOD PRODUCT EXPIRATION DATE: 201902232359
ISSUE DATE / TIME: 201902221321
ISSUE DATE / TIME: 201902221321
UNIT TYPE AND RH: 6200
Unit Type and Rh: 5100

## 2017-05-31 LAB — COOXEMETRY PANEL
Carboxyhemoglobin: 1 % (ref 0.5–1.5)
Methemoglobin: 1.6 % — ABNORMAL HIGH (ref 0.0–1.5)
O2 Saturation: 68.3 %
TOTAL HEMOGLOBIN: 10.2 g/dL — AB (ref 12.0–16.0)

## 2017-05-31 LAB — CBC
HCT: 35.7 % — ABNORMAL LOW (ref 36.0–46.0)
HEMATOCRIT: 30 % — AB (ref 36.0–46.0)
HEMOGLOBIN: 11.9 g/dL — AB (ref 12.0–15.0)
Hemoglobin: 9.8 g/dL — ABNORMAL LOW (ref 12.0–15.0)
MCH: 30.7 pg (ref 26.0–34.0)
MCH: 30.3 pg (ref 26.0–34.0)
MCHC: 33.3 g/dL (ref 30.0–36.0)
MCHC: 32.7 g/dL (ref 30.0–36.0)
MCV: 92 fL (ref 78.0–100.0)
MCV: 92.9 fL (ref 78.0–100.0)
PLATELETS: 91 10*3/uL — AB (ref 150–400)
Platelets: 142 10*3/uL — ABNORMAL LOW (ref 150–400)
RBC: 3.88 MIL/uL (ref 3.87–5.11)
RBC: 3.23 MIL/uL — ABNORMAL LOW (ref 3.87–5.11)
RDW: 16.6 % — ABNORMAL HIGH (ref 11.5–15.5)
RDW: 16.6 % — AB (ref 11.5–15.5)
WBC: 14 10*3/uL — AB (ref 4.0–10.5)
WBC: 13.2 10*3/uL — AB (ref 4.0–10.5)

## 2017-05-31 LAB — PREPARE CRYOPRECIPITATE
UNIT DIVISION: 0
Unit division: 0

## 2017-05-31 LAB — BLOOD GAS, ARTERIAL
Acid-Base Excess: 0.1 mmol/L (ref 0.0–2.0)
Bicarbonate: 24.1 mmol/L (ref 20.0–28.0)
Drawn by: 51191
FIO2: 60
MECHVT: 550 mL
O2 Saturation: 98.6 %
PEEP: 5 cmH2O
Patient temperature: 98.6
Pressure support: 10 cmH2O
RATE: 16 resp/min
pCO2 arterial: 38.5 mmHg (ref 32.0–48.0)
pH, Arterial: 7.414 (ref 7.350–7.450)
pO2, Arterial: 117 mmHg — ABNORMAL HIGH (ref 83.0–108.0)

## 2017-05-31 LAB — CREATININE, SERUM
Creatinine, Ser: 2.72 mg/dL — ABNORMAL HIGH (ref 0.44–1.00)
GFR calc non Af Amer: 16 mL/min — ABNORMAL LOW (ref >60)
GFR, EST AFRICAN AMERICAN: 19 mL/min — AB (ref >60)

## 2017-05-31 LAB — PREPARE PLATELET PHERESIS
UNIT DIVISION: 0
Unit division: 0

## 2017-05-31 LAB — PREPARE FRESH FROZEN PLASMA
UNIT DIVISION: 0
Unit division: 0

## 2017-05-31 LAB — POCT I-STAT, CHEM 8
BUN: 56 mg/dL — ABNORMAL HIGH (ref 6–20)
Calcium, Ion: 1.31 mmol/L (ref 1.15–1.40)
Chloride: 103 mmol/L (ref 101–111)
Creatinine, Ser: 2.8 mg/dL — ABNORMAL HIGH (ref 0.44–1.00)
Glucose, Bld: 103 mg/dL — ABNORMAL HIGH (ref 65–99)
HEMATOCRIT: 28 % — AB (ref 36.0–46.0)
HEMOGLOBIN: 9.5 g/dL — AB (ref 12.0–15.0)
Potassium: 5.6 mmol/L — ABNORMAL HIGH (ref 3.5–5.1)
SODIUM: 137 mmol/L (ref 135–145)
TCO2: 27 mmol/L (ref 22–32)

## 2017-05-31 LAB — BPAM FFP
BLOOD PRODUCT EXPIRATION DATE: 201902272359
Blood Product Expiration Date: 201902272359
ISSUE DATE / TIME: 201902221313
ISSUE DATE / TIME: 201902221313
UNIT TYPE AND RH: 6200
Unit Type and Rh: 6200

## 2017-05-31 LAB — MAGNESIUM
MAGNESIUM: 2.4 mg/dL (ref 1.7–2.4)
Magnesium: 2.2 mg/dL (ref 1.7–2.4)
Magnesium: 2.4 mg/dL (ref 1.7–2.4)

## 2017-05-31 MED ORDER — INSULIN DETEMIR 100 UNIT/ML ~~LOC~~ SOLN
20.0000 [IU] | Freq: Once | SUBCUTANEOUS | Status: AC
  Administered 2017-05-31: 20 [IU] via SUBCUTANEOUS
  Filled 2017-05-31: qty 0.2

## 2017-05-31 MED ORDER — MILRINONE LACTATE IN DEXTROSE 20-5 MG/100ML-% IV SOLN
0.2000 ug/kg/min | INTRAVENOUS | Status: DC
  Administered 2017-05-31: 0.2 ug/kg/min via INTRAVENOUS
  Filled 2017-05-31: qty 100

## 2017-05-31 MED ORDER — INSULIN ASPART 100 UNIT/ML ~~LOC~~ SOLN: 0.0000 [IU] | SUBCUTANEOUS | Status: DC

## 2017-05-31 MED ORDER — ORAL CARE MOUTH RINSE
15.0000 mL | Freq: Two times a day (BID) | OROMUCOSAL | Status: DC
  Administered 2017-05-31: 15 mL via OROMUCOSAL

## 2017-05-31 NOTE — Anesthesia Postprocedure Evaluation (Signed)
Anesthesia Post Note  Patient: Fallyn Munnerlyn  Procedure(s) Performed: REPLACEMENT OF ASCENDING AORTIC ANEURYSM AND REPAIRED CHRONIC ROOT DISSECTION WITH CIRC ARREST (N/A Chest) AORTIC VALVE REPLACEMENT (AVR) (N/A Chest) TRANSESOPHAGEAL ECHOCARDIOGRAM (TEE) (N/A )     Patient location during evaluation: ICU Anesthesia Type: General Level of consciousness: sedated Pain management: pain level controlled Vital Signs Assessment: post-procedure vital signs reviewed and stable Respiratory status: patient remains intubated per anesthesia plan Postop Assessment: no apparent nausea or vomiting Anesthetic complications: no Comments: MAP adequate with aid of vasopressors. Remains intubated early POD#1    Last Vitals:  Vitals:   05/31/17 0830 05/31/17 0925  BP:  120/69  Pulse: 96 93  Resp: 16   Temp: 37.3 C   SpO2: 98%     Last Pain:  Vitals:   05/31/17 0800  TempSrc: Core  PainSc:                  Audry Pili

## 2017-05-31 NOTE — Progress Notes (Signed)
Admit: 05/22/2017 LOS: 9  [name] with AoCKD4 and dCHF exacerbation with severe AI in need of eval for AV repair  Subjective:  S/p AVR replacement and replacement of ascending aortic aneurysm with dissection yesterday Stable on dopamine, milrinone, NE SCr below pre-op level; I suspect was dilutional, slowly climbing up not surprising Low grade fevers overnight 1.5L UOP, only 272m overnight   02/22 0701 - 02/23 0700 In: 10058.3 [I.V.:6785.3; Blood:2623; IV Piggyback:650] Out: 3300 [Urine:1505; Blood:1295; Chest Tube:500]  Filed Weights   05/29/17 0500 05/30/17 0456 05/31/17 0500  Weight: 92.1 kg (203 lb 0.7 oz) 91.2 kg (201 lb 1 oz) 101.9 kg (224 lb 10.4 oz)    Scheduled Meds: . acetaminophen  1,000 mg Oral Q6H   Or  . acetaminophen (TYLENOL) oral liquid 160 mg/5 mL  1,000 mg Per Tube Q6H  . aspirin EC  325 mg Oral Daily   Or  . aspirin  324 mg Per Tube Daily  . bisacodyl  10 mg Oral Daily   Or  . bisacodyl  10 mg Rectal Daily  . chlorhexidine      . chlorhexidine gluconate (MEDLINE KIT)  15 mL Mouth Rinse BID  . docusate sodium  200 mg Oral Daily  . famotidine (PEPCID) IV  20 mg Intravenous Q12H  . insulin regular  0-10 Units Intravenous TID WC  . levalbuterol  1.25 mg Nebulization Q6H  . mouth rinse  15 mL Mouth Rinse 10 times per day  . metoCLOPramide (REGLAN) injection  10 mg Intravenous Q6H  . metoprolol tartrate  12.5 mg Oral BID   Or  . metoprolol tartrate  12.5 mg Per Tube BID  . mometasone-formoterol  2 puff Inhalation BID  . [START ON 06/01/2017] pantoprazole  40 mg Oral Daily  . sodium chloride flush  3 mL Intravenous Q12H   Continuous Infusions: . sodium chloride 20 mL/hr at 05/30/17 1900  . sodium chloride    . sodium chloride 20 mL/hr at 05/31/17 0103  . albumin human    . cefUROXime (ZINACEF)  IV    . dexmedetomidine (PRECEDEX) IV infusion 0.7 mcg/kg/hr (05/31/17 0224)  . DOPamine    . EPINEPHrine 4 mg in dextrose 5% 250 mL infusion (16 mcg/mL) Stopped  (05/31/17 0433)  . insulin (NOVOLIN-R) infusion 1.4 Units/hr (05/31/17 0504)  . lactated ringers 20 mL/hr at 05/30/17 1900  . lactated ringers 20 mL/hr at 05/30/17 1900  . magnesium sulfate    . milrinone 0.3 mcg/kg/min (05/31/17 0055)  . norepinephrine (LEVOPHED) Adult infusion 10 mcg/min (05/31/17 0415)  . phenylephrine 279m250mL NS (0.0848ml) infusion 10 mcg/min (05/31/17 0340)   PRN Meds:.sodium chloride, albumin human, gabapentin, metoprolol tartrate, midazolam, morphine injection, ondansetron (ZOFRAN) IV, oxyCODONE, sodium chloride flush, traMADol  Current Labs: reviewed    Physical Exam:  Blood pressure 112/67, pulse 88, temperature 99.3 F (37.4 C), temperature source Core, resp. rate (!) 23, height '5\' 6"'  (1.676 m), weight 101.9 kg (224 lb 10.4 oz), SpO2 97 %. GEN: No acute distress, pleasant ENT: NCAT EYES: EOMI CV: Regular rhythm, has early diastolic murmur and mid systolic murmur, normal rate  PULM: CTA B  ABD: soft, nontender SKIN: No rashes or lesions EXT:  Trace edema bilaterally  A 1. CKD4, SCr around 3 prior to cardiac surgery 2. Type A aortic dissection with pericardial effusion and severe aortic insufficiency s/p AVR and aorta replacement 2/22 3. Secondary hyperparathyroidism on calcitriol 4. HTN, on inotrope / pressor currently 5. CAD  P 1. Stable in first 24h  after surgery 2. Will cont to follow closely  Pearson Grippe MD 05/31/2017, 5:51 AM  Recent Labs  Lab 05/26/17 0246  05/29/17 0353 05/30/17 0254  05/30/17 2330 05/31/17 0000 05/31/17 0305  NA 142  142   < > 138 138   < > 138 136 137  K 3.5  3.5   < > 4.6 4.6   < > 6.0* 5.4* 5.0  CL 100*  100*   < > 99* 100*   < > 104 106 107  CO2 30  30   < > 29 28  --   --  23 23  GLUCOSE 99  98   < > 96 100*   < > 121* 121* 96  BUN 38*  38*   < > 42* 48*   < > 54* 43* 43*  CREATININE 3.06*  3.08*   < > 3.26* 3.09*   < > 2.50* 2.53* 2.62*  CALCIUM 9.7  9.6   < > 9.7 9.9  --   --  8.8* 9.0  PHOS  3.6  --  4.8*  --   --   --   --   --    < > = values in this interval not displayed.   Recent Labs  Lab 05/30/17 1734  05/30/17 2324 05/30/17 2330 05/31/17 0305  WBC 10.7*  --  12.9*  --  14.0*  HGB 10.9*   < > 12.2 11.9* 11.9*  HCT 32.4*   < > 36.7 35.0* 35.7*  MCV 94.5  --  93.6  --  92.0  PLT 150  --  158  --  142*   < > = values in this interval not displayed.

## 2017-05-31 NOTE — Plan of Care (Signed)
  Progressing Health Behavior/Discharge Planning: Ability to manage health-related needs will improve 05/31/2017 1051 - Progressing by Vickey Sages, RN Clinical Measurements: Ability to maintain clinical measurements within normal limits will improve 05/31/2017 1051 - Progressing by Vickey Sages, RN Will remain free from infection 05/31/2017 1051 - Progressing by Vickey Sages, RN Diagnostic test results will improve 05/31/2017 1051 - Progressing by Vickey Sages, RN Respiratory complications will improve 05/31/2017 1051 - Progressing by Vickey Sages, RN Cardiovascular complication will be avoided 05/31/2017 1051 - Progressing by Vickey Sages, RN Coping: Level of anxiety will decrease 05/31/2017 1051 - Progressing by Vickey Sages, RN Education: Ability to demonstrate management of disease process will improve 05/31/2017 1051 - Progressing by Vickey Sages, RN Ability to verbalize understanding of medication therapies will improve 05/31/2017 1051 - Progressing by Vickey Sages, RN Activity: Capacity to carry out activities will improve 05/31/2017 1051 - Progressing by Vickey Sages, RN Cardiac: Ability to achieve and maintain adequate cardiopulmonary perfusion will improve 05/31/2017 1051 - Progressing by Vickey Sages, RN Education: Ability to demonstrate proper wound care will improve 05/31/2017 1051 - Progressing by Vickey Sages, RN Knowledge of disease or condition will improve 05/31/2017 1051 - Progressing by Vickey Sages, RN Knowledge of the prescribed therapeutic regimen will improve 05/31/2017 1051 - Progressing by Vickey Sages, RN Activity: Risk for activity intolerance will decrease 05/31/2017 1051 - Progressing by Vickey Sages, RN Cardiac: Hemodynamic stability will improve 05/31/2017 1051 - Progressing by Vickey Sages, RN Clinical  Measurements: Postoperative complications will be avoided or minimized 05/31/2017 1051 - Progressing by Vickey Sages, RN Respiratory: Respiratory status will improve 05/31/2017 1051 - Progressing by Vickey Sages, RN Skin Integrity: Wound healing without signs and symptoms of infection 05/31/2017 1051 - Progressing by Vickey Sages, RN Risk for impaired skin integrity will decrease 05/31/2017 1051 - Progressing by Vickey Sages, RN Urinary Elimination: Ability to achieve and maintain adequate renal perfusion and functioning will improve 05/31/2017 1051 - Progressing by Vickey Sages, RN

## 2017-05-31 NOTE — Progress Notes (Signed)
      [address]       [place] [address]             [phone]        CARDIOTHORACIC SURGERY PROGRESS NOTE   R1 Day Post-Op Procedure(s) (LRB): REPLACEMENT OF ASCENDING AORTIC ANEURYSM AND REPAIRED CHRONIC ROOT DISSECTION WITH CIRC ARREST (N/A) TRANSESOPHAGEAL ECHOCARDIOGRAM (TEE) (N/A) AORTIC VALVE REPLACEMENT (AVR) (N/A)  Subjective: Sedated on vent but wakes up, follows some simple commands.  Reportedly slow to wake up overnight last night  Objective: Vital signs: BP Readings from Last 1 Encounters:  05/31/17 104/64   Pulse Readings from Last 1 Encounters:  05/31/17 85   Resp Readings from Last 1 Encounters:  05/31/17 14   Temp Readings from Last 1 Encounters:  05/31/17 98.8 F (37.1 C)    Hemodynamics: PAP: (17-37)/(9-21) 21/14 CO:  [3.8 L/min-4.3 L/min] 4.3 L/min CI:  [1.7 L/min/m2-2.2 L/min/m2] 2.1 L/min/m2  Physical Exam:  Rhythm:   sinus  Breath sounds: Coarse but clear  Heart sounds:  RRR  Incisions:  Dressings dry, intact  Abdomen:  Soft, non-distended, non-tender  Extremities:  Warm, well-perfused  Chest tubes:  low volume thin serosanguinous output, no air leak    Intake/Output from previous day: 02/22 0701 - 02/23 0700 In: 10515.9 [I.V.:7242.9; HMCNO:7096; IV Piggyback:650] Out: 2836 [OQHUT:6546; Blood:1295; Chest Tube:530] Intake/Output this shift: Total I/O In: 822.8 [I.V.:522.8; NG/GT:50; IV Piggyback:250] Out: 345 [Urine:245; Chest Tube:100]  Lab Results:  CBC: Recent Labs    05/30/17 2324 05/30/17 2330 05/31/17 0305  WBC 12.9*  --  14.0*  HGB 12.2 11.9* 11.9*  HCT 36.7 35.0* 35.7*  PLT 158  --  142*    BMET:  Recent Labs    05/31/17 0000 05/31/17 0305  NA 136 137  K 5.4* 5.0  CL 106 107  CO2 23 23  GLUCOSE 121* 96  BUN 43* 43*  CREATININE 2.53* 2.62*  CALCIUM 8.8* 9.0     PT/INR:   Recent Labs    05/30/17 1734  LABPROT 11.3*  INR 0.83    CBG (last 3)  Recent Labs    05/31/17 0905  05/31/17 1009 05/31/17 1112  GLUCAP 112* 101* 104*    ABG    Component Value Date/Time   PHART 7.414 05/31/2017 0350   PCO2ART 38.5 05/31/2017 0350   PO2ART 117 (H) 05/31/2017 0350   HCO3 24.1 05/31/2017 0350   TCO2 27 05/30/2017 2330   ACIDBASEDEF 1.0 05/30/2017 1800   O2SAT 98.6 05/31/2017 0350    CXR: PORTABLE CHEST 1 VIEW  COMPARISON:  May 30, 2016  FINDINGS: The ETT, left central line, PA catheter, and chest tubes are stable. No pneumothorax. Opacity in the left base is stable. Cardiomediastinal silhouette is unchanged.  IMPRESSION: Stable support apparatus.  Stable opacity in the left base.   Electronically Signed   By: Dorise Bullion III M.D   On: 05/31/2017 07:16   EKG: NSR w/out acute ischemic changes, LBBB (old)   Assessment/Plan: S/P Procedure(s) (LRB): REPLACEMENT OF ASCENDING AORTIC ANEURYSM AND REPAIRED CHRONIC ROOT DISSECTION WITH CIRC ARREST (N/A) TRANSESOPHAGEAL ECHOCARDIOGRAM (TEE) (N/A) AORTIC VALVE REPLACEMENT (AVR) (N/A)  Stable POD1 Maintaining NSR w/ stable hemodynamics on milrinone 0.3 levophed 5 and dopamine 3 O2 sats 98-100% on 40% FiO2, CXR looks good   Proceed w/ acute vent wean  Mobilize and d/c lines after extubation  Martha Alberts, MD 05/31/2017 12:13 PM

## 2017-05-31 NOTE — Progress Notes (Signed)
Initial Nutrition Assessment  DOCUMENTATION CODES:   Obesity unspecified  INTERVENTION:  When TF appropriate, recommend Vital High Protein @ 45 mL/hr with 30 mL Prostat BID. This regimen will provide 1280 kcal (104% estimated kcal need), 124 grams of protein (105% estimated protein need), and 903 mL free water.   NUTRITION DIAGNOSIS:   Inadequate oral intake related to inability to eat as evidenced by NPO status.  GOAL:   Provide needs based on ASPEN/SCCM guidelines  MONITOR:   Vent status, Weight trends, Labs, Skin  REASON FOR ASSESSMENT:   Ventilator  ASSESSMENT:   72 y.o. female with hx of CKD, CHF, HTN, anemia, pericardial effusion who presents to the emergency room with shortness of breath.  Patient has been eating and drinking well. Progressively over the last week patient has become increasingly short of breath. Developed a dry hacking cough. Patient got short of breath to the point that she could not lay flat.    BMI indicates obesity. No family/visitors present to provide PTA information. Nephrology following. Pt is POD #1 replacement of ascending aortic aneurysm and repair of chronic root dissection with arrest; TEE; aortic valve replacement. First scaled weight obtained 2/16 and weight +31 lbs/14.3 kg since that time. Used weight from 2/16 (87.6 kg) in estimating needs.   No OGT/NGT in place when RD entered the room. RN reported that tube placement unsuccessful yesterday. RN was able to place OGT while RD was in the room and KUB has been obtained, awaiting results.   Patient is currently intubated on ventilator support MV: 8.7 L/min Temp (24hrs), Avg:98.6 F (37 C), Min:95.4 F (35.2 C), Max:100.6 F (38.1 C) Propofol: none BP: 120/69 and MAP: 83  Medications reviewed; 10 mg Dulcolax per OGT/day, 200 mg Colace per OGT/day, 20 mg IV Pepcid BID, 40 mg IV Lasix x1 dose yesterday, 4 g IV Mg sulfate x1 run yesterday, 10 mg IV Reglan QID.  Labs reviewed; CBGs: 73-118  mg/dL since midnight, BUN: 43 mg/dL, creatinine: 2.62 mg/dL, GFR: 20 mL/min.   Drips: Levo @ 8 mcg/min, Milrinone @ 0.3 mcg/kg/min, Precedex @ 0.7 mcg/kg/hr, insulin @ 1.6 units/hr, dopamine @ 3 mcg/kg/min.       NUTRITION - FOCUSED PHYSICAL EXAM:  Completed/assessed with no muscle and no fat wasting; mild edema.  Diet Order:  Diet NPO time specified  EDUCATION NEEDS:   No education needs have been identified at this time  Skin:  Skin Assessment: Skin Integrity Issues: Skin Integrity Issues:: Incisions Incisions: R groin and chest incisions 2/22  Last BM:  2/19  Height:   Ht Readings from Last 1 Encounters:  05/28/17 5\' 6"  (1.676 m)    Weight:   Wt Readings from Last 1 Encounters:  05/31/17 224 lb 10.4 oz (101.9 kg)    Ideal Body Weight:  59.09 kg  BMI:  Body mass index is 36.26 kg/m.  Estimated Nutritional Needs:   Kcal:  235-3614  Protein:  118 grams  Fluid:  >/= 1.5 L/day      Jarome Matin, MS, RD, LDN, [facility] Inpatient Clinical Dietitian Pager # [phone] After hours/weekend pager # [phone]

## 2017-05-31 NOTE — Progress Notes (Signed)
TCTS BRIEF SICU PROGRESS NOTE  1 Day Post-Op  S/P Procedure(s) (LRB): REPLACEMENT OF ASCENDING AORTIC ANEURYSM AND REPAIRED CHRONIC ROOT DISSECTION WITH CIRC ARREST (N/A) TRANSESOPHAGEAL ECHOCARDIOGRAM (TEE) (N/A) AORTIC VALVE REPLACEMENT (AVR) (N/A)   Stable day Extubated uneventfully NSR w/ stable BP although still on levophed and low dose milrinone Breathing comfortably w/ O2 sats 98% on 4 L/min UOP adequate  Plan: Continue current plan  Rexene Alberts, MD 05/31/2017 5:56 PM

## 2017-05-31 NOTE — Procedures (Signed)
Extubation Procedure Note  Patient Details:   Name: [name] DOB: 05-Aug-1945 MRN: [id number]   Airway Documentation:  + cuff leak test prior to extubation.  VC 863ml NIF -28   Evaluation  O2 sats: stable throughout Complications: No apparent complications Patient did tolerate procedure well. Bilateral Breath Sounds: Diminished, Clear   Yes pt able to speak, no stridor noted.  No distress noted, pt denies SOB.  Upon extubation, noted small skin tear on left cheek near mouth (underneath where tube holder adheres cheeks)= about the size of a pea.  RN in room and aware.    [name] 05/31/2017, 1:35 PM

## 2017-05-31 NOTE — Progress Notes (Signed)
Pt more awake now.  SICU/CVTS weaning protocol initiated.  RN aware.

## 2017-06-01 ENCOUNTER — Inpatient Hospital Stay (HOSPITAL_COMMUNITY): Payer: Medicare Other

## 2017-06-01 LAB — POCT I-STAT 3, ART BLOOD GAS (G3+)
Bicarbonate: 24.5 mmol/L (ref 20.0–28.0)
O2 SAT: 95 %
TCO2: 26 mmol/L (ref 22–32)
pCO2 arterial: 40 mmHg (ref 32.0–48.0)
pH, Arterial: 7.396 (ref 7.350–7.450)
pO2, Arterial: 78 mmHg — ABNORMAL LOW (ref 83.0–108.0)

## 2017-06-01 LAB — TYPE AND SCREEN
ABO/RH(D): A POS
Antibody Screen: NEGATIVE
Unit division: 0
Unit division: 0
Unit division: 0
Unit division: 0
Unit division: 0
Unit division: 0
Unit division: 0
Unit division: 0
Unit division: 0
Unit division: 0
Unit division: 0
Unit division: 0

## 2017-06-01 LAB — GLUCOSE, CAPILLARY
GLUCOSE-CAPILLARY: 97 mg/dL (ref 65–99)
Glucose-Capillary: 101 mg/dL — ABNORMAL HIGH (ref 65–99)
Glucose-Capillary: 103 mg/dL — ABNORMAL HIGH (ref 65–99)
Glucose-Capillary: 99 mg/dL (ref 65–99)
Glucose-Capillary: 89 mg/dL (ref 65–99)
Glucose-Capillary: 100 mg/dL — ABNORMAL HIGH (ref 65–99)
Glucose-Capillary: 96 mg/dL (ref 65–99)
Glucose-Capillary: 122 mg/dL — ABNORMAL HIGH (ref 65–99)
Glucose-Capillary: 99 mg/dL (ref 65–99)

## 2017-06-01 LAB — CBC
HCT: 30.7 % — ABNORMAL LOW (ref 36.0–46.0)
Hemoglobin: 10.1 g/dL — ABNORMAL LOW (ref 12.0–15.0)
MCH: 30.9 pg (ref 26.0–34.0)
MCHC: 32.9 g/dL (ref 30.0–36.0)
MCV: 93.9 fL (ref 78.0–100.0)
Platelets: 74 10*3/uL — ABNORMAL LOW (ref 150–400)
RBC: 3.27 MIL/uL — ABNORMAL LOW (ref 3.87–5.11)
RDW: 16.8 % — AB (ref 11.5–15.5)
WBC: 15.9 10*3/uL — AB (ref 4.0–10.5)

## 2017-06-01 LAB — BPAM RBC
Blood Product Expiration Date: 201903072359
Blood Product Expiration Date: 201903072359
Blood Product Expiration Date: 201903072359
Blood Product Expiration Date: 201903072359
Blood Product Expiration Date: 201903092359
Blood Product Expiration Date: 201903092359
Blood Product Expiration Date: 201903092359
Blood Product Expiration Date: 201903092359
Blood Product Expiration Date: 201903092359
Blood Product Expiration Date: 201903092359
Blood Product Expiration Date: 201903092359
Blood Product Expiration Date: 201903092359
ISSUE DATE / TIME: 201902220840
ISSUE DATE / TIME: 201902220840
ISSUE DATE / TIME: 201902220840
ISSUE DATE / TIME: 201902220840
ISSUE DATE / TIME: 201902220840
ISSUE DATE / TIME: 201902220840
ISSUE DATE / TIME: 201902221315
ISSUE DATE / TIME: 201902221315
Unit Type and Rh: 6200
Unit Type and Rh: 6200
Unit Type and Rh: 6200
Unit Type and Rh: 6200
Unit Type and Rh: 6200
Unit Type and Rh: 6200
Unit Type and Rh: 6200
Unit Type and Rh: 6200
Unit Type and Rh: 6200
Unit Type and Rh: 6200
Unit Type and Rh: 6200
Unit Type and Rh: 6200

## 2017-06-01 LAB — COOXEMETRY PANEL
CARBOXYHEMOGLOBIN: 1.5 % (ref 0.5–1.5)
Methemoglobin: 1.2 % (ref 0.0–1.5)
O2 Saturation: 71.3 %
TOTAL HEMOGLOBIN: 10.1 g/dL — AB (ref 12.0–16.0)

## 2017-06-01 LAB — BASIC METABOLIC PANEL
ANION GAP: 10 (ref 5–15)
BUN: 47 mg/dL — AB (ref 6–20)
CALCIUM: 9.2 mg/dL (ref 8.9–10.3)
CO2: 21 mmol/L — ABNORMAL LOW (ref 22–32)
Chloride: 106 mmol/L (ref 101–111)
Creatinine, Ser: 2.65 mg/dL — ABNORMAL HIGH (ref 0.44–1.00)
GFR calc Af Amer: 20 mL/min — ABNORMAL LOW (ref >60)
GFR, EST NON AFRICAN AMERICAN: 17 mL/min — AB (ref >60)
GLUCOSE: 104 mg/dL — AB (ref 65–99)
POTASSIUM: 4.7 mmol/L (ref 3.5–5.1)
SODIUM: 137 mmol/L (ref 135–145)

## 2017-06-01 MED ORDER — LEVALBUTEROL HCL 1.25 MG/0.5ML IN NEBU: 1.2500 mg | INHALATION_SOLUTION | Freq: Four times a day (QID) | RESPIRATORY_TRACT | Status: DC | PRN

## 2017-06-01 MED ORDER — FUROSEMIDE 10 MG/ML IJ SOLN
40.0000 mg | Freq: Two times a day (BID) | INTRAMUSCULAR | Status: DC
  Administered 2017-06-01 (×2): 40 mg via INTRAVENOUS
  Filled 2017-06-01 (×2): qty 4

## 2017-06-01 MED ORDER — INSULIN ASPART 100 UNIT/ML ~~LOC~~ SOLN
0.0000 [IU] | Freq: Three times a day (TID) | SUBCUTANEOUS | Status: DC
  Administered 2017-06-02 – 2017-06-08 (×4): 2 [IU] via SUBCUTANEOUS

## 2017-06-01 MED ORDER — ORAL CARE MOUTH RINSE
15.0000 mL | Freq: Two times a day (BID) | OROMUCOSAL | Status: DC
  Administered 2017-06-01 – 2017-06-05 (×10): 15 mL via OROMUCOSAL

## 2017-06-01 MED ORDER — MOVING RIGHT ALONG BOOK
Freq: Once | Status: DC
  Filled 2017-06-01 (×2): qty 1

## 2017-06-01 NOTE — Progress Notes (Signed)
Admit: 05/22/2017 LOS: 10  58F with AoCKD4 and dCHF exacerbation with severe AI in need of eval for AV repair  Subjective:  Doing well, extubated, in chair this AM Stable SCr, K, HCO3 Adequate UOP not req diuretics Remains on low dose  dopamine + milrinone  02/23 0701 - 02/24 0700 In: 2401 [P.O.:240; I.V.:1711; NG/GT:100; IV Piggyback:350] Out: 1965 [Urine:1265; Chest Tube:700]  Filed Weights   05/30/17 0456 05/31/17 0500 06/01/17 0500  Weight: 91.2 kg (201 lb 1 oz) 101.9 kg (224 lb 10.4 oz) 97.1 kg (214 lb 1.1 oz)    Scheduled Meds: . acetaminophen  1,000 mg Oral Q6H   Or  . acetaminophen (TYLENOL) oral liquid 160 mg/5 mL  1,000 mg Per Tube Q6H  . aspirin EC  325 mg Oral Daily   Or  . aspirin  324 mg Per Tube Daily  . bisacodyl  10 mg Oral Daily   Or  . bisacodyl  10 mg Rectal Daily  . docusate sodium  200 mg Oral Daily  . insulin aspart  0-24 Units Subcutaneous Q4H  . levalbuterol  1.25 mg Nebulization Q6H  . mouth rinse  15 mL Mouth Rinse BID  . metoCLOPramide (REGLAN) injection  10 mg Intravenous Q6H  . mometasone-formoterol  2 puff Inhalation BID  . pantoprazole  40 mg Oral Daily  . sodium chloride flush  3 mL Intravenous Q12H   Continuous Infusions: . sodium chloride    . dexmedetomidine (PRECEDEX) IV infusion Stopped (05/31/17 1300)  . DOPamine 3 mcg/kg/min (05/31/17 1900)  . lactated ringers Stopped (05/31/17 0800)  . lactated ringers 20 mL/hr at 06/01/17 0641  . milrinone 0.2 mcg/kg/min (05/31/17 2200)  . norepinephrine (LEVOPHED) Adult infusion Stopped (06/01/17 0530)   PRN Meds:.gabapentin, metoprolol tartrate, midazolam, morphine injection, ondansetron (ZOFRAN) IV, oxyCODONE, sodium chloride flush, traMADol  Current Labs: reviewed    Physical Exam:  Blood pressure (!) 104/52, pulse 94, temperature 98.4 F (36.9 C), temperature source Oral, resp. rate 20, height 5\' 6"  (1.676 m), weight 97.1 kg (214 lb 1.1 oz), SpO2 98 %. GEN: No acute distress,  pleasant ENT: NCAT EYES: EOMI CV: Regular rhythm, has early diastolic murmur and mid systolic murmur, normal rate  PULM: CTA B  ABD: soft, nontender SKIN: No rashes or lesions EXT:  Trace edema bilaterally  A 1. CKD4, SCr around 3 prior to cardiac surgery 2. Type A aortic dissection with pericardial effusion and severe aortic insufficiency s/p AVR and aorta replacement 2/22 3. Secondary hyperparathyroidism on calcitriol 4. HTN, on inotrope currently 5. CAD  P 1. Renal function continues to be stable 2. Will cont to follow closely  Pearson Grippe MD 06/01/2017, 7:32 AM  Recent Labs  Lab 05/26/17 0246  05/29/17 0353  05/31/17 0000 05/31/17 0305 05/31/17 1648 05/31/17 2006 06/01/17 0341  NA 142  142   < > 138   < > 136 137 137  --  137  K 3.5  3.5   < > 4.6   < > 5.4* 5.0 5.6*  --  4.7  CL 100*  100*   < > 99*   < > 106 107 103  --  106  CO2 30  30   < > 29   < > 23 23  --   --  21*  GLUCOSE 99  98   < > 96   < > 121* 96 103*  --  104*  BUN 38*  38*   < > 42*   < >  43* 43* 56*  --  47*  CREATININE 3.06*  3.08*   < > 3.26*   < > 2.53* 2.62* 2.80* 2.72* 2.65*  CALCIUM 9.7  9.6   < > 9.7   < > 8.8* 9.0  --   --  9.2  PHOS 3.6  --  4.8*  --   --   --   --   --   --    < > = values in this interval not displayed.   Recent Labs  Lab 05/31/17 0305 05/31/17 1648 05/31/17 2006 06/01/17 0341  WBC 14.0*  --  13.2* 15.9*  HGB 11.9* 9.5* 9.8* 10.1*  HCT 35.7* 28.0* 30.0* 30.7*  MCV 92.0  --  92.9 93.9  PLT 142*  --  91* 74*

## 2017-06-01 NOTE — Op Note (Signed)
[name], [name]               ACCOUNT NO.:  [id number]  MEDICAL RECORD NO.:  [id number]  LOCATION:  2H22C                        FACILITY:  [facility]  PHYSICIAN:  Ivin Poot, M.D.  DATE OF BIRTH:  1946/02/17  DATE OF PROCEDURE:  05/30/2017 DATE OF DISCHARGE:                              OPERATIVE REPORT   OPERATION: 1. Aortic valve replacement. 2. Repair of chronic type A ascending aortic dissection with     replacement of the ascending aorta with a 28 mm straight Hemashield     graft using hypothermic circulatory arrest and antegrade cerebral     perfusion. 3. Right axillary artery cannulation.  SURGEON:  Ivin Poot, MD.  ASSISTANT:  Nicholes Rough, PA-C and Ara Kussmaul, RNFA.  ANESTHESIA:  General by Dr. Natasha Bence.  PREOPERATIVE DIAGNOSES:  Severe aortic insufficiency, class 4 congestive heart failure, chronic type A aortic dissection, and severe aortic stenosis.  POSTOPERATIVE DIAGNOSES:  Severe aortic insufficiency, class 4 congestive heart failure, chronic type A aortic dissection, and severe aortic stenosis.  CLINICAL NOTE:  The patient is a 72 year old hypertensive female smoker with chronic renal insufficiency and diabetes.  She has been treated for aortic stenosis-insufficiency with medical therapy and did fairly well until recently when she was admitted with respiratory distress, pulmonary edema, and required BiPAP to avoid intubation.  She underwent diuresis with improved pulmonary status and an echocardiogram, transthoracic showed severe aortic insufficiency and stenosis.  Because of an elevated creatinine, she did not undergo CT scan or cardiac catheterization.  She did undergo a transesophageal echo, which showed a clear false lumen in the descending thoracic aorta as well as a short segment of a false lumen in the aortic root.  There was severe aortic insufficiency and mild-moderate mitral regurgitation.  Surgical evaluation was requested.  The  patient had no chest pain.  I reviewed the patient's echo studies and a noncontrasted CT scan of the chest. This showed her ascending aorta to be 4.6 cm in diameter, but without contrast.  There was no definition of a false lumen or intimal tear.  I recommended aortic valve replacement with repair of the ascending aneurysm and apparently chronic dissection.  I discussed the procedure in detail with the patient including the indications, benefits, and risks.  She understood that we would use hypothermic circulatory arrest and there was a risk of bleeding, risk of blood transfusion, risk of stroke, as well as risk of postoperative pulmonary problems from her fairly severe COPD with FEV1 less than 1.0 and risks of organ failure, renal failure requiring dialysis, and death.  She understood these implications and agreed to proceed with surgery under what I felt was an informed consent.  OPERATIVE FINDINGS: 1. Tricuspid aortic valve with severe insufficiency. 2. Chronic aortic dissection with a short segment of false lumen on     the medial aspect of the ascending aorta, which was scarred and     walled off from propagating into the arch. 3. Severe left ventricular dilatation.  DESCRIPTION OF PROCEDURE:  After informed consent was documented in the preop holding area and the patient assessed, the patient was brought to the operating room, placed supine on the operating  table, where general anesthesia was induced.  A transesophageal echo probe was placed by the anesthesia team.  This confirmed the preoperative diagnosis of aortic insufficiency and dilated aortic root with a short-segment false lumen. There was also false lumen noted in the descending thoracic aorta.  The patient was prepped and draped as a sterile field.  A proper time- out was performed.  An incision was made beneath the right clavicle and the right axillary artery was dissected and encircled with vessel loops. Heparin was  administered and the axillary artery was clamped proximally and distally.  An arteriotomy was performed and the combined graft- cannula was sewn end-to-side with a running 5-0 Prolene and covered with a light layer of Coseal adhesive.  The clamps were removed.  There was good hemostasis.  A sternotomy was then performed.  The pericardium was opened.  There was a chronic serous effusion.  There was no blood in the effusion.  The ascending aorta appeared to be scarred and thickened medially against the pulmonary artery.  The sternal retractor was placed.  Pursestrings were placed in the right atrium for venous cannulation as well as for retrograde coronary sinus cardioplegia.  The patient was placed on cardiopulmonary bypass.  A left ventricular vent was placed via the right superior pulmonary vein.  We tried to dissect the pulmonary artery off the aorta; however, this was very thickened and herein and in performing this dissection, there was some entry of approximately 5 mm into the pulmonary artery and this was repaired with 2 pledgeted 5-0 Prolene sutures.  As the patient was being cooled, we placed the cross-clamp on the aorta beneath the innominate artery.  The innominate artery had been encircled with a vessel loop.  After cross-clamping the aorta, cardioplegia was delivered via the retrograde coronary sinus because of the patient's severe AI.  1.5 L was delivered with good hypothermic cardioplegic arrest and septal temperature dropped less than 14 degrees. Cardioplegia was delivered every 30 minutes with a crossclamp was placed.  The aorta was divided beneath the clamp.  The aorta was inspected. There was no false lumen above the sinotubular junction.  The aorta was dissected down to the root.  The left main coronary ostium was enlarged, but intact.  There was a second defect in the aortic root at the left coronary commissure, which was a scarred, well formed blind tract,  which ended approximately 2.5 cm and was directed towards the pulmonary artery, but without fistula.  The entry point was closed over with interrupted pledgeted 4-0 Prolene sutures.  The aortic valve, which was trileaflet was inspected.  There were fenestrations in the right coronary leaflet.  The leaflets were excised and the annulus debrided of calcium.  It was sized to a 21 mm Magna Ease valve.  The aortic valve replacement was then performed after the aorta was trimmed back to the sinotubular junction.  Interrupted 2-0 pledgeted Ethibond sutures were placed around the annulus, numbering 14 total. The valve was prepared according to protocol and the valve was seated and tied (serial # [id number] [facility] Ease size 21 mm).  The valve conformed the annulus well and there was no space for paravalvular leak.  After cardioplegia had been delivered, a 28 mm straight Hemashield graft was selected and sewn and into the aorta at the sinotubular junction. This was completed with a running 4-0 Prolene, which was reinforced with interrupted 4-0 pledgeted Prolene sutures both on the inside of the anastomosis posteriorly and the outside of  the anastomosis anteriorly.  Then, the patient was prepared for a circulatory arrest, placed in deep Trendelenburg, and CO2 was insufflated in the operative field.  Blood was drained to the pump.  The crossclamp was removed.  The aorta was trimmed back to the innominate artery.  A clamp was placed on the innominate artery for antegrade cerebral perfusion.  The inside of the arch was inspected.  There was no evidence of an intimal tear or false lumen.  There was some thickening calcification.  The straight graft was then trimmed to the appropriate length and orientation and sewn end-to-end to the ascending aorta at the proximal arch with a running 4-0 Prolene pledgeted sutures on the inside of the anastomosis posteriorly and on the outside of the  anastomosis anteriorly.  A vent was placed in the graft and the clamp was removed from the innominate artery and full body perfusion was then re- initiated.  While the patient was in circulatory arrest, the cerebral oximetry transfusions were used and indicated adequate perfusion of both hemispheres of the brain.  There was some minimal bleeding from needle holes in the outflow anastomosis which was repaired with an interrupted pledgeted Prolene 4-0 sutures.  This was a period of rewarming.  The cardioplegia cannulas were removed.  Temporary pacing wires were applied.  The patient was rewarmed back to 36.5 degrees.  Low-dose Milrinone and norepinephrine were started.  The lungs were expanded, ventilator was resumed.  When the patient reached normothermia, she weaned off cardiopulmonary bypass without difficulty.  The valve was working satisfactorily.  There was good LV contraction.  Good RV function.  The patient was in a paced rhythm.  The venous cannula was removed.  The patient was given protamine without adverse reaction.  The patient's platelet count was low, less than 80,000 and platelets and FFP and cryoprecipitate were given after the protamine did not result in adequate coagulation function.  The graft to the axillary artery was divided and stapled. The incision was closed.  There was still inadequate coagulation function.  The patient was given a half dose of factor VII with improved coagulation function.  Bilateral pleural tubes and anterior mediastinal drainage tubes were placed.  The sternum was closed with wire.  The pectoralis fascia was closed with a running #1 Vicryl.  The subcutaneous and skin layers were closed in running Vicryl.  Total cardiopulmonary bypass time was 228 minutes.     Ivin Poot, M.D.     PV/MEDQ  D:  06/01/2017  T:  06/01/2017  Job:  [id number]

## 2017-06-01 NOTE — Progress Notes (Signed)
[address]       [place], [address]             [phone]        CARDIOTHORACIC SURGERY PROGRESS NOTE   R2 Days Post-Op Procedure(s) (LRB): REPLACEMENT OF ASCENDING AORTIC ANEURYSM AND REPAIRED CHRONIC ROOT DISSECTION WITH CIRC ARREST (N/A) TRANSESOPHAGEAL ECHOCARDIOGRAM (TEE) (N/A) AORTIC VALVE REPLACEMENT (AVR) (N/A)  Subjective: Looks very good and feels well.  Mild soreness in chest.  No SOB.  No nausea or abdominal pain but no appetite yet  Objective: Vital signs: BP Readings from Last 1 Encounters:  06/01/17 (!) 111/57   Pulse Readings from Last 1 Encounters:  06/01/17 95   Resp Readings from Last 1 Encounters:  06/01/17 (!) 23   Temp Readings from Last 1 Encounters:  06/01/17 97.8 F (36.6 C) (Oral)    Hemodynamics: PAP: (20-38)/(12-18) 27/14  Mixed venous co-ox 71%   Physical Exam:  Rhythm:   sinus  Breath sounds: clear  Heart sounds:  RRR  Incisions:  Dressings dry, intact  Abdomen:  Soft, non-distended, non-tender  Extremities:  Warm, well-perfused  Chest tubes:  low volume thin serosanguinous output, no air leak    Intake/Output from previous day: 02/23 0701 - 02/24 0700 In: 2401 [P.O.:240; I.V.:1711; NG/GT:100; IV Piggyback:350] Out: 1965 [Urine:1265; Chest Tube:700] Intake/Output this shift: Total I/O In: 93.6 [I.V.:93.6] Out: 140 [Urine:125; Chest Tube:15]  Lab Results:  CBC: Recent Labs    05/31/17 2006 06/01/17 0341  WBC 13.2* 15.9*  HGB 9.8* 10.1*  HCT 30.0* 30.7*  PLT 91* 74*    BMET:  Recent Labs    05/31/17 0305 05/31/17 1648 05/31/17 2006 06/01/17 0341  NA 137 137  --  137  K 5.0 5.6*  --  4.7  CL 107 103  --  106  CO2 23  --   --  21*  GLUCOSE 96 103*  --  104*  BUN 43* 56*  --  47*  CREATININE 2.62* 2.80* 2.72* 2.65*  CALCIUM 9.0  --   --  9.2     PT/INR:   Recent Labs    05/30/17 1734  LABPROT 11.3*  INR 0.83    CBG (last 3)  Recent Labs    06/01/17 0013 06/01/17 0354  06/01/17 0841  GLUCAP 103* 97 89    ABG    Component Value Date/Time   PHART 7.396 06/01/2017 0453   PCO2ART 40.0 06/01/2017 0453   PO2ART 78.0 (L) 06/01/2017 0453   HCO3 24.5 06/01/2017 0453   TCO2 26 06/01/2017 0453   ACIDBASEDEF 2.0 05/31/2017 1255   O2SAT 95.0 06/01/2017 0453    CXR: PORTABLE CHEST 1 VIEW  COMPARISON:  05/31/2017  FINDINGS: Endotracheal tube is been removed. Swan-Ganz catheter is been removed. Mediastinal drain and bilateral chest tubes remain in place. No pneumothorax. Left subclavian central line tip in the SVC at the azygos level. Persistent left lower lobe atelectasis.  IMPRESSION: Endotracheal tube and Swan-Ganz catheter removed. Other lines and tubes remain well positioned. No pneumothorax. Persistent left lower lobe atelectasis.   Electronically Signed   By: Nelson Chimes M.D.   On: 06/01/2017 06:41  Assessment/Plan: S/P Procedure(s) (LRB): REPLACEMENT OF ASCENDING AORTIC ANEURYSM AND REPAIRED CHRONIC ROOT DISSECTION WITH CIRC ARREST (N/A) TRANSESOPHAGEAL ECHOCARDIOGRAM (TEE) (N/A) AORTIC VALVE REPLACEMENT (AVR) (N/A)  Doing very well POD2 Maintaining NSR w/ stable BP on low dose dopamine and milrinone, co-ox 71% Acute on chronic diastolic CHF with expected post-op  volume excess, good UOP, weight reportedly 21 lbs > preop CKD with elevated creatinine but still at or below preop baseline Expected post op acute blood loss anemia, stable Expected post op atelectasis, mild Post op thrombocytopenia, platelet count down slightly 74k   Mobilize   Diuresis  Wean drips  D/C tubes  Watch renal function  Watch platelet count, anemia  PT consult to assist w/ mobility   Rexene Alberts, MD 06/01/2017 10:42 AM

## 2017-06-02 ENCOUNTER — Encounter (HOSPITAL_COMMUNITY): Payer: Self-pay | Admitting: Cardiothoracic Surgery

## 2017-06-02 ENCOUNTER — Ambulatory Visit: Payer: Medicare Other | Admitting: Cardiology

## 2017-06-02 LAB — CBC
HCT: 31.5 % — ABNORMAL LOW (ref 36.0–46.0)
Hemoglobin: 10.1 g/dL — ABNORMAL LOW (ref 12.0–15.0)
MCH: 30.9 pg (ref 26.0–34.0)
MCHC: 32.1 g/dL (ref 30.0–36.0)
MCV: 96.3 fL (ref 78.0–100.0)
PLATELETS: 85 10*3/uL — AB (ref 150–400)
RBC: 3.27 MIL/uL — AB (ref 3.87–5.11)
RDW: 16.6 % — AB (ref 11.5–15.5)
WBC: 17.2 10*3/uL — ABNORMAL HIGH (ref 4.0–10.5)

## 2017-06-02 LAB — COMPREHENSIVE METABOLIC PANEL
ALT: 34 U/L (ref 14–54)
AST: 54 U/L — ABNORMAL HIGH (ref 15–41)
Albumin: 2.2 g/dL — ABNORMAL LOW (ref 3.5–5.0)
Alkaline Phosphatase: 57 U/L (ref 38–126)
Anion gap: 8 (ref 5–15)
BUN: 55 mg/dL — ABNORMAL HIGH (ref 6–20)
CHLORIDE: 105 mmol/L (ref 101–111)
CO2: 23 mmol/L (ref 22–32)
Calcium: 9.4 mg/dL (ref 8.9–10.3)
Creatinine, Ser: 2.7 mg/dL — ABNORMAL HIGH (ref 0.44–1.00)
GFR calc non Af Amer: 17 mL/min — ABNORMAL LOW (ref >60)
GFR, EST AFRICAN AMERICAN: 19 mL/min — AB (ref >60)
Glucose, Bld: 105 mg/dL — ABNORMAL HIGH (ref 65–99)
POTASSIUM: 4.4 mmol/L (ref 3.5–5.1)
SODIUM: 136 mmol/L (ref 135–145)
Total Bilirubin: 1 mg/dL (ref 0.3–1.2)
Total Protein: 5.4 g/dL — ABNORMAL LOW (ref 6.5–8.1)

## 2017-06-02 LAB — GLUCOSE, CAPILLARY
GLUCOSE-CAPILLARY: 84 mg/dL (ref 65–99)
GLUCOSE-CAPILLARY: 94 mg/dL (ref 65–99)
Glucose-Capillary: 80 mg/dL (ref 65–99)
Glucose-Capillary: 127 mg/dL — ABNORMAL HIGH (ref 65–99)

## 2017-06-02 MED ORDER — FUROSEMIDE 80 MG PO TABS
80.0000 mg | ORAL_TABLET | Freq: Two times a day (BID) | ORAL | Status: DC
  Administered 2017-06-02 (×2): 80 mg via ORAL
  Filled 2017-06-02 (×2): qty 1

## 2017-06-02 MED ORDER — SODIUM CHLORIDE 0.9 % IV SOLN
1.0000 g | Freq: Two times a day (BID) | INTRAVENOUS | Status: DC
  Administered 2017-06-02 – 2017-06-04 (×6): 1 g via INTRAVENOUS
  Filled 2017-06-02 (×8): qty 1

## 2017-06-02 NOTE — Progress Notes (Signed)
Nutrition Follow-up  DOCUMENTATION CODES:   Obesity unspecified  INTERVENTION:   -If po intake inadequate on follow-up, recommend addition of oral nutrition supplement  -Recommend advancing diet as tolerated   NUTRITION DIAGNOSIS:   Inadequate oral intake related to inability to eat as evidenced by NPO status.  Being addressed as diet advanced post extubation, tolerating po, good appetite  GOAL:   Patient will meet greater than or equal to 90% of their needs  Progressing  MONITOR:   PO intake, Supplement acceptance, Diet advancement, Labs, Weight trends  REASON FOR ASSESSMENT:   Ventilator    ASSESSMENT:   72 y.o. female with hx of CKD, CHF, HTN, anemia, pericardial effusion who presents to the emergency room with shortness of breath.  Patient has been eating and drinking well. Progressively over the last week patient has become increasingly short of breath. Developed a dry hacking cough. Patient got short of breath to the point that she could not lay flat.    2/22 Replacement of ascending aortic aneurysm and repaired chronic root dissection with circ arrest, TEE, AVR  2/23 Extubated  Recorded po intake 75% of meals; pt reports good appetite, ready for solid food. Pt reports good appetite PTA as well.   Current wt 211 pounds, noted weight up since admission, 2+ edema b/l legs. Weight 224 pounds post surgery. Pt reports dry weight down to 196 pounds. Weight on admission 193 pounds.   Labs: Creatinine 2.70, BUN 55 Meds: lasix, reglan   Diet Order:  Diet full liquid Room service appropriate? Yes; Fluid consistency: Thin  EDUCATION NEEDS:   No education needs have been identified at this time  Skin:  Skin Assessment: Skin Integrity Issues: Skin Integrity Issues:: Incisions Incisions: R groin and chest incisions 2/22  Last BM:  2/24  Height:   Ht Readings from Last 1 Encounters:  05/28/17 5\' 6"  (1.676 m)    Weight:   Wt Readings from Last 1 Encounters:   06/02/17 211 lb 13.8 oz (96.1 kg)    Ideal Body Weight:  59.09 kg  BMI:  Body mass index is 34.2 kg/m.  Estimated Nutritional Needs:   Kcal:  1600-1800 kcals   Protein:  87-100 g  Fluid:  >/= 1.6 L   Kerman Passey MS, RD, LDN, CNSC [phone] Pager  [phone] Weekend/On-Call Pager

## 2017-06-02 NOTE — Progress Notes (Signed)
Patient having a small problem with previous introducer site draining slightly. Pressure was held for approximately 10 minutes and dressing has been changed and pressure adjusted to assist in closure. Rn has notified Agricultural consultant and will address issue with MD when he is out of OR.

## 2017-06-02 NOTE — Progress Notes (Signed)
Subjective: Interval History: has no complaint , doing well.  Objective: Vital signs in last 24 hours: Temp:  [97.8 F (36.6 C)-98.9 F (37.2 C)] 97.9 F (36.6 C) (02/25 0300) Pulse Rate:  [71-96] 72 (02/25 0700) Resp:  [14-24] 17 (02/25 0700) BP: (93-159)/(54-95) 136/71 (02/25 0700) SpO2:  [97 %-100 %] 100 % (02/25 0811) Weight:  [96.1 kg (211 lb 13.8 oz)] 96.1 kg (211 lb 13.8 oz) (02/25 0428) Weight change: -1 kg (-3.3 oz)  Intake/Output from previous day: 02/24 0701 - 02/25 0700 In: 1030 [P.O.:720; I.V.:310] Out: 1246 [Urine:1230; Stool:1; Chest Tube:15] Intake/Output this shift: No intake/output data recorded.  General appearance: alert, cooperative, moderately obese and pale Resp: diminished breath sounds bilaterally and rales bibasilar Cardio: S1, S2 normal, systolic murmur: systolic ejection 2/6, decrescendo at 2nd left intercostal space and diastolic murmur: mid diastolic 2/6, blowing at 2nd left intercostal space GI: obese, pos bs,  Extremities: edema 2-3+  Lab Results: Recent Labs    06/01/17 0341 06/02/17 0425  WBC 15.9* 17.2*  HGB 10.1* 10.1*  HCT 30.7* 31.5*  PLT 74* 85*   BMET:  Recent Labs    06/01/17 0341 06/02/17 0425  NA 137 136  K 4.7 4.4  CL 106 105  CO2 21* 23  GLUCOSE 104* 105*  BUN 47* 55*  CREATININE 2.65* 2.70*  CALCIUM 9.2 9.4   No results for input(s): PTH in the last 72 hours. Iron Studies: No results for input(s): IRON, TIBC, TRANSFERRIN, FERRITIN in the last 72 hours.  Studies/Results: Dg Chest Port 1 View  Result Date: 06/01/2017 CLINICAL DATA:  Extubated.  Chest tubes.  Follow-up. EXAM: PORTABLE CHEST 1 VIEW COMPARISON:  05/31/2017 FINDINGS: Endotracheal tube is been removed. Swan-Ganz catheter is been removed. Mediastinal drain and bilateral chest tubes remain in place. No pneumothorax. Left subclavian central line tip in the SVC at the azygos level. Persistent left lower lobe atelectasis. IMPRESSION: Endotracheal tube and  Swan-Ganz catheter removed. Other lines and tubes remain well positioned. No pneumothorax. Persistent left lower lobe atelectasis. Electronically Signed   By: Nelson Chimes M.D.   On: 06/01/2017 06:41   Dg Abd Portable 1v  Result Date: 05/31/2017 CLINICAL DATA:  Confirm OG tube placement EXAM: PORTABLE ABDOMEN - 1 VIEW COMPARISON:  None. FINDINGS: The OG tube terminates in the left upper quadrant, likely the stomach. IMPRESSION: The OG tube terminates in the region of the stomach. Electronically Signed   By: Dorise Bullion III M.D   On: 05/31/2017 10:02    I have reviewed the patient's current medications.  Assessment/Plan: 1 CKD 4 stable. Vol xs.  Acidemia improving 2 Anemia stable 3 S/P aortic aneurysm repait 4 HPTH 5 Obesity 6 DM controlled P change Lasix to po, will only follow 1-2 d more if stable.    LOS: 11 days   Jeneen Rinks Iyanni Hepp 06/02/2017,8:14 AM

## 2017-06-02 NOTE — Progress Notes (Signed)
3 Days Post-Op Procedure(s) (LRB): REPLACEMENT OF ASCENDING AORTIC ANEURYSM AND REPAIRED CHRONIC ROOT DISSECTION WITH CIRC ARREST (N/A) TRANSESOPHAGEAL ECHOCARDIOGRAM (TEE) (N/A) AORTIC VALVE REPLACEMENT (AVR) (N/A) Subjective: Up in chair nsr Neuro intact Objective: Vital signs in last 24 hours: Temp:  [97.8 F (36.6 C)-98.9 F (37.2 C)] 97.9 F (36.6 C) (02/25 0300) Pulse Rate:  [71-96] 72 (02/25 0700) Cardiac Rhythm: Normal sinus rhythm;Bundle branch block (02/25 0732) Resp:  [14-24] 17 (02/25 0700) BP: (93-159)/(54-95) 136/71 (02/25 0700) SpO2:  [97 %-100 %] 98 % (02/25 0700) Weight:  [211 lb 13.8 oz (96.1 kg)] 211 lb 13.8 oz (96.1 kg) (02/25 0428)  Hemodynamic parameters for last 24 hours:    Intake/Output from previous day: 02/24 0701 - 02/25 0700 In: 1030 [P.O.:720; I.V.:310] Out: 1246 [Urine:1230; Stool:1; Chest Tube:15] Intake/Output this shift: No intake/output data recorded.       Exam    General- alert and comfortable    Neck- no JVD, no cervical adenopathy palpable, no carotid bruit   Lungs- clear without rales, wheezes   Cor- regular rate and rhythm, no murmur , gallop   Abdomen- soft, non-tender   Extremities - warm, non-tender, minimal edema   Neuro- oriented, appropriate, no focal weakness   Lab Results: Recent Labs    06/01/17 0341 06/02/17 0425  WBC 15.9* 17.2*  HGB 10.1* 10.1*  HCT 30.7* 31.5*  PLT 74* 85*   BMET:  Recent Labs    06/01/17 0341 06/02/17 0425  NA 137 136  K 4.7 4.4  CL 106 105  CO2 21* 23  GLUCOSE 104* 105*  BUN 47* 55*  CREATININE 2.65* 2.70*  CALCIUM 9.2 9.4    PT/INR:  Recent Labs    05/30/17 1734  LABPROT 11.3*  INR 0.83   ABG    Component Value Date/Time   PHART 7.396 06/01/2017 0453   HCO3 24.5 06/01/2017 0453   TCO2 26 06/01/2017 0453   ACIDBASEDEF 2.0 05/31/2017 1255   O2SAT 95.0 06/01/2017 0453   CBG (last 3)  Recent Labs    06/01/17 1625 06/01/17 2010 06/01/17 2243  GLUCAP 96 122* 99     Assessment/Plan: S/P Procedure(s) (LRB): REPLACEMENT OF ASCENDING AORTIC ANEURYSM AND REPAIRED CHRONIC ROOT DISSECTION WITH CIRC ARREST (N/A) TRANSESOPHAGEAL ECHOCARDIOGRAM (TEE) (N/A) AORTIC VALVE REPLACEMENT (AVR) (N/A)  Replacement of Ascending aorta  Mobilize Add Fortaz for inc WBC, poss pneimonia, hx copd-smoking Lasix - follow creat / CKD Keep in ICU   LOS: 11 days    [name] 06/02/2017

## 2017-06-02 NOTE — Evaluation (Signed)
Physical Therapy Evaluation Patient Details Name: [name] MRN: [id number] DOB: 1945-07-26 Today's Date: 06/02/2017   History of Present Illness  Pt adm with SOB and found to have pulmonary edema and pericardial effusion. Pt found to have type A aortic dissection and aortic insufficiency and underwent AVR and repair of aortic ascending aneurysm on 05/30/17. PMH - CKD, chf, HTN, DJD  Clinical Impression  Pt presents to PT with decline in mobility, strength, and balance after lengthy hospital stay and major surgery. Will benefit from PT to maximize independence and safety prior to return home with husband. Expect pt will make steady progress and be able to return home with supportive husband.     Follow Up Recommendations Home health PT;Supervision/Assistance - 24 hour    Equipment Recommendations  Other (comment)(rollator)    Recommendations for Other Services       Precautions / Restrictions Precautions Precautions: Sternal;Fall Restrictions Weight Bearing Restrictions: Yes(sternal precautions)      Mobility  Bed Mobility               General bed mobility comments: Pt up in chair  Transfers Overall transfer level: Needs assistance Equipment used: 4-wheeled walker Transfers: Sit to/from Stand Sit to Stand: Mod assist         General transfer comment: Assist to bring hips up and for balance. Pt used hands on knees to come up  Ambulation/Gait Ambulation/Gait assistance: Min assist Ambulation Distance (Feet): 140 Feet Assistive device: 4-wheeled walker Gait Pattern/deviations: Step-through pattern;Decreased step length - right;Decreased step length - left;Shuffle;Trunk flexed Gait velocity: decr Gait velocity interpretation: Below normal speed for age/gender General Gait Details: Assist for balance. Verbal cues to stand more erect. Pt amb on  2L of O2 with dyspnea 2/4 and SpO2 >90% until last 10' with dyspnea to 3/4 and SpO2 86%. Incr SpO2 back to  4L.  Stairs            Wheelchair Mobility    Modified Rankin (Stroke Patients Only)       Balance Overall balance assessment: Needs assistance Sitting-balance support: No upper extremity supported;Feet supported Sitting balance-Leahy Scale: Good     Standing balance support: Bilateral upper extremity supported Standing balance-Leahy Scale: Poor Standing balance comment: rollator and min assist for static standing                             Pertinent Vitals/Pain Pain Assessment: Faces Faces Pain Scale: Hurts a little bit Pain Location: incisional Pain Descriptors / Indicators: Operative site guarding;Grimacing Pain Intervention(s): Limited activity within patient's tolerance;Monitored during session    Home Living Family/patient expects to be discharged to:: Private residence Living Arrangements: Spouse/significant other Available Help at Discharge: Family;Available 24 hours/day Type of Home: House Home Access: Stairs to enter Entrance Stairs-Rails: None Entrance Stairs-Number of Steps: 1 Home Layout: One level Home Equipment: None      Prior Function Level of Independence: Independent               Hand Dominance        Extremity/Trunk Assessment   Upper Extremity Assessment Upper Extremity Assessment: Defer to OT evaluation(Significant edema especially in hands)    Lower Extremity Assessment Lower Extremity Assessment: Generalized weakness       Communication   Communication: No difficulties  Cognition Arousal/Alertness: Awake/alert Behavior During Therapy: WFL for tasks assessed/performed Overall Cognitive Status: Within Functional Limits for tasks assessed  General Comments      Exercises     Assessment/Plan    PT Assessment Patient needs continued PT services  PT Problem List Decreased strength;Decreased activity tolerance;Decreased balance;Decreased  mobility;Decreased knowledge of use of DME;Decreased knowledge of precautions;Cardiopulmonary status limiting activity;Obesity       PT Treatment Interventions DME instruction;Gait training;Functional mobility training;Therapeutic activities;Therapeutic exercise;Balance training;Patient/family education    PT Goals (Current goals can be found in the Care Plan section)  Acute Rehab PT Goals Patient Stated Goal: return home with husband PT Goal Formulation: With patient Time For Goal Achievement: 06/16/17 Potential to Achieve Goals: Good    Frequency Min 3X/week   Barriers to discharge        Co-evaluation               AM-PAC PT "6 Clicks" Daily Activity  Outcome Measure Difficulty turning over in bed (including adjusting bedclothes, sheets and blankets)?: Unable Difficulty moving from lying on back to sitting on the side of the bed? : Unable Difficulty sitting down on and standing up from a chair with arms (e.g., wheelchair, bedside commode, etc,.)?: Unable Help needed moving to and from a bed to chair (including a wheelchair)?: A Lot Help needed walking in hospital room?: A Little Help needed climbing 3-5 steps with a railing? : Total 6 Click Score: 9    End of Session Equipment Utilized During Treatment: Gait belt;Oxygen Activity Tolerance: Patient limited by fatigue Patient left: in chair;with call bell/phone within reach;with nursing/sitter in room Nurse Communication: Mobility status PT Visit Diagnosis: Unsteadiness on feet (R26.81);Other abnormalities of gait and mobility (R26.89);Muscle weakness (generalized) (M62.81)    Time: 1771-1657 PT Time Calculation (min) (ACUTE ONLY): 22 min   Charges:   PT Evaluation $PT Eval Moderate Complexity: 1 Mod     PT G CodesMarland Kitchen        [facility] [place] 06/02/2017, 10:07 AM

## 2017-06-02 NOTE — Progress Notes (Signed)
      [address]       [place] [address]             [phone]      POD # 3  Up in chair, ambulated earlier  BP 136/63 Comment: rechecked cuff and repositioned  Pulse 77   Temp 98.5 F (36.9 C) (Oral)   Resp (!) 26   Ht 5\' 6"  (1.676 m)   Wt 211 lb 13.8 oz (96.1 kg)   SpO2 (!) 70%   BMI 34.20 kg/m   Intake/Output Summary (Last 24 hours) at 06/02/2017 1857 Last data filed at 06/02/2017 1838 Gross per 24 hour  Intake 1310 ml  Output 1305 ml  Net 5 ml   Doing well  Remo Lipps C. Roxan Hockey, MD [facility] [phone]

## 2017-06-03 ENCOUNTER — Institutional Professional Consult (permissible substitution): Payer: Medicare Other | Admitting: Emergency Medicine

## 2017-06-03 ENCOUNTER — Inpatient Hospital Stay (HOSPITAL_COMMUNITY): Payer: Medicare Other

## 2017-06-03 LAB — IRON AND TIBC
Iron: 18 ug/dL — ABNORMAL LOW (ref 28–170)
Saturation Ratios: 14 % (ref 10.4–31.8)
TIBC: 126 ug/dL — ABNORMAL LOW (ref 250–450)
UIBC: 108 ug/dL

## 2017-06-03 LAB — CBC
HCT: 29.4 % — ABNORMAL LOW (ref 36.0–46.0)
Hemoglobin: 9.6 g/dL — ABNORMAL LOW (ref 12.0–15.0)
MCH: 31.4 pg (ref 26.0–34.0)
MCHC: 32.7 g/dL (ref 30.0–36.0)
MCV: 96.1 fL (ref 78.0–100.0)
Platelets: 98 10*3/uL — ABNORMAL LOW (ref 150–400)
RBC: 3.06 MIL/uL — ABNORMAL LOW (ref 3.87–5.11)
RDW: 16.2 % — ABNORMAL HIGH (ref 11.5–15.5)
WBC: 13.4 10*3/uL — ABNORMAL HIGH (ref 4.0–10.5)

## 2017-06-03 LAB — COMPREHENSIVE METABOLIC PANEL
ALT: 31 U/L (ref 14–54)
AST: 33 U/L (ref 15–41)
Albumin: 2.2 g/dL — ABNORMAL LOW (ref 3.5–5.0)
Alkaline Phosphatase: 67 U/L (ref 38–126)
Anion gap: 9 (ref 5–15)
BUN: 61 mg/dL — ABNORMAL HIGH (ref 6–20)
CO2: 24 mmol/L (ref 22–32)
Calcium: 9.6 mg/dL (ref 8.9–10.3)
Chloride: 107 mmol/L (ref 101–111)
Creatinine, Ser: 2.36 mg/dL — ABNORMAL HIGH (ref 0.44–1.00)
GFR calc Af Amer: 23 mL/min — ABNORMAL LOW (ref >60)
GFR calc non Af Amer: 20 mL/min — ABNORMAL LOW (ref >60)
Glucose, Bld: 101 mg/dL — ABNORMAL HIGH (ref 65–99)
Potassium: 4.3 mmol/L (ref 3.5–5.1)
Sodium: 140 mmol/L (ref 135–145)
Total Bilirubin: 0.9 mg/dL (ref 0.3–1.2)
Total Protein: 5.7 g/dL — ABNORMAL LOW (ref 6.5–8.1)

## 2017-06-03 LAB — GLUCOSE, CAPILLARY
GLUCOSE-CAPILLARY: 86 mg/dL (ref 65–99)
GLUCOSE-CAPILLARY: 106 mg/dL — AB (ref 65–99)
Glucose-Capillary: 88 mg/dL (ref 65–99)
Glucose-Capillary: 104 mg/dL — ABNORMAL HIGH (ref 65–99)

## 2017-06-03 MED ORDER — LOSARTAN POTASSIUM 50 MG PO TABS
50.0000 mg | ORAL_TABLET | Freq: Every day | ORAL | Status: DC
  Administered 2017-06-03: 50 mg via ORAL
  Filled 2017-06-03 (×2): qty 1

## 2017-06-03 MED ORDER — HYDRALAZINE HCL 20 MG/ML IJ SOLN: 10.0000 mg | INTRAMUSCULAR | Status: DC | PRN

## 2017-06-03 MED ORDER — FUROSEMIDE 80 MG PO TABS: 80.0000 mg | ORAL_TABLET | Freq: Two times a day (BID) | ORAL | Status: DC

## 2017-06-03 MED ORDER — FUROSEMIDE 10 MG/ML IJ SOLN: 80.0000 mg | Freq: Every day | INTRAMUSCULAR | Status: DC

## 2017-06-03 MED ORDER — CARVEDILOL 6.25 MG PO TABS
6.2500 mg | ORAL_TABLET | Freq: Two times a day (BID) | ORAL | Status: DC
  Administered 2017-06-03 (×2): 6.25 mg via ORAL
  Filled 2017-06-03 (×3): qty 1

## 2017-06-03 MED ORDER — HYDRALAZINE HCL 25 MG PO TABS
25.0000 mg | ORAL_TABLET | Freq: Three times a day (TID) | ORAL | Status: DC
  Administered 2017-06-03 – 2017-06-05 (×6): 25 mg via ORAL
  Filled 2017-06-03 (×7): qty 1

## 2017-06-03 MED ORDER — METOLAZONE 5 MG PO TABS: 5.0000 mg | ORAL_TABLET | Freq: Every day | ORAL | Status: DC

## 2017-06-03 MED ORDER — FUROSEMIDE 80 MG PO TABS
160.0000 mg | ORAL_TABLET | Freq: Two times a day (BID) | ORAL | Status: DC
  Administered 2017-06-03 – 2017-06-04 (×4): 160 mg via ORAL
  Filled 2017-06-03 (×5): qty 2

## 2017-06-03 MED FILL — Calcium Chloride Inj 10%: INTRAVENOUS | Qty: 10 | Status: AC

## 2017-06-03 MED FILL — Lidocaine HCl IV Inj 20 MG/ML: INTRAVENOUS | Qty: 30 | Status: AC

## 2017-06-03 MED FILL — Electrolyte-R (PH 7.4) Solution: INTRAVENOUS | Qty: 4000 | Status: AC

## 2017-06-03 MED FILL — Heparin Sodium (Porcine) Inj 1000 Unit/ML: INTRAMUSCULAR | Qty: 20 | Status: AC

## 2017-06-03 MED FILL — Mannitol IV Soln 20%: INTRAVENOUS | Qty: 1000 | Status: AC

## 2017-06-03 MED FILL — Sodium Chloride IV Soln 0.9%: INTRAVENOUS | Qty: 3000 | Status: AC

## 2017-06-03 MED FILL — Magnesium Sulfate Inj 50%: INTRAMUSCULAR | Qty: 10 | Status: AC

## 2017-06-03 MED FILL — Potassium Chloride Inj 2 mEq/ML: INTRAVENOUS | Qty: 40 | Status: AC

## 2017-06-03 MED FILL — Sodium Bicarbonate IV Soln 8.4%: INTRAVENOUS | Qty: 50 | Status: AC

## 2017-06-03 MED FILL — Heparin Sodium (Porcine) Inj 1000 Unit/ML: INTRAMUSCULAR | Qty: 30 | Status: AC

## 2017-06-03 NOTE — Progress Notes (Addendum)
Physical Therapy Treatment Patient Details Name: [name] MRN: [id number] DOB: 03/02/1946 Today's Date: 06/03/2017    History of Present Illness Pt adm with SOB and found to have pulmonary edema and pericardial effusion. Pt found to have type A aortic dissection and aortic insufficiency and underwent AVR and repair of aortic ascending aneurysm on 05/30/17. PMH - CKD, chf, HTN, DJD    PT Comments    Pt performed gait and functional mobility during session this am.   She remains to present with desaturation with activity and required 3L and subsequent rest breaks to improve O2 sats during gait training.  At times waveform was poor.  Pt does present with DOE.  Pt remains to present with poor ability to follow sternal precautions and further education provided. Plan next session for continued gait training to tolerance and education on sternal precautions. Plan is for return home with support from husband.     Bp pre session 139/62 Bp post session 152/68   Follow Up Recommendations  Home health PT;Supervision/Assistance - 24 hour     Equipment Recommendations  Other (comment)(rollator ( 4 wheeled Rw with a seat))    Recommendations for Other Services       Precautions / Restrictions Precautions Precautions: Sternal;Fall Restrictions Weight Bearing Restrictions: Yes(sternal precautions.  )    Mobility  Bed Mobility               General bed mobility comments: Pt up in the chair but required mod assistance to scoot forward to edge of chair.  use of chux pad during scooting.  Pt attempting to reach for arm rest to push and re-educated on moving in the tube.    Transfers Overall transfer level: Needs assistance Equipment used: 4-wheeled walker(rollator) Transfers: Sit to/from Stand Sit to Stand: Mod assist         General transfer comment: Cues for hand placement on knees to maintain sternal precautions,  Pt required assistance to boost into standing.     Ambulation/Gait Ambulation/Gait assistance: Min assist Ambulation Distance (Feet): 120 Feet(required x3 standing rest breaks due to desaturation, Pt required 3L with activity and remains to require intermittent standing rest breaks to improve O2 sats greater than 90%.  ) Assistive device: 4-wheeled walker Gait Pattern/deviations: Step-through pattern;Decreased step length - right;Decreased step length - left;Shuffle;Trunk flexed Gait velocity: decr Gait velocity interpretation: Below normal speed for age/gender General Gait Details: Assist for balance. Verbal cues to stand more erect. Pt amb on  3L of O2 with dyspnea 2/4 and SpO2 >90%  for 75% of the time patient did require standign rest breaks when she dropped to low 80s in percentile with activity.     Stairs            Wheelchair Mobility    Modified Rankin (Stroke Patients Only)       Balance Overall balance assessment: Needs assistance   Sitting balance-Leahy Scale: Good       Standing balance-Leahy Scale: Poor Standing balance comment: rollator and min assist for static standing                            Cognition Arousal/Alertness: Awake/alert Behavior During Therapy: WFL for tasks assessed/performed Overall Cognitive Status: Within Functional Limits for tasks assessed  Exercises      General Comments        Pertinent Vitals/Pain Pain Assessment: Faces Faces Pain Scale: Hurts little more Pain Location: incisional Pain Descriptors / Indicators: Operative site guarding;Grimacing Pain Intervention(s): Monitored during session;Repositioned    Home Living                      Prior Function            PT Goals (current goals can now be found in the care plan section) Acute Rehab PT Goals Patient Stated Goal: return home with husband Potential to Achieve Goals: Good Progress towards PT goals: Progressing toward goals     Frequency    Min 3X/week      PT Plan Current plan remains appropriate    Co-evaluation              AM-PAC PT "6 Clicks" Daily Activity  Outcome Measure  Difficulty turning over in bed (including adjusting bedclothes, sheets and blankets)?: Unable Difficulty moving from lying on back to sitting on the side of the bed? : Unable Difficulty sitting down on and standing up from a chair with arms (e.g., wheelchair, bedside commode, etc,.)?: Unable Help needed moving to and from a bed to chair (including a wheelchair)?: A Lot Help needed walking in hospital room?: A Little Help needed climbing 3-5 steps with a railing? : A Lot 6 Click Score: 10    End of Session Equipment Utilized During Treatment: Gait belt;Oxygen Activity Tolerance: Patient limited by fatigue Patient left: in chair;with call bell/phone within reach;with nursing/sitter in room Nurse Communication: Mobility status PT Visit Diagnosis: Unsteadiness on feet (R26.81);Other abnormalities of gait and mobility (R26.89);Muscle weakness (generalized) (M62.81)     Time: 3244-0102 PT Time Calculation (min) (ACUTE ONLY): 29 min  Charges:  $Gait Training: 8-22 mins $Therapeutic Activity: 8-22 mins                    G Codes:       Governor Rooks, PTA pager [phone]    Cristela Blue 06/03/2017, 10:34 AM

## 2017-06-03 NOTE — Progress Notes (Signed)
4 Days Post-Op Procedure(s) (LRB): REPLACEMENT OF ASCENDING AORTIC ANEURYSM AND REPAIRED CHRONIC ROOT DISSECTION WITH CIRC ARREST (N/A) TRANSESOPHAGEAL ECHOCARDIOGRAM (TEE) (N/A) AORTIC VALVE REPLACEMENT (AVR) (N/A) Subjective: Hypertensive, edematous CXR wet Objective: Vital signs in last 24 hours: Temp:  [97.9 F (36.6 C)-99.2 F (37.3 C)] 98.2 F (36.8 C) (02/26 0300) Pulse Rate:  [67-92] 72 (02/26 0700) Cardiac Rhythm: Normal sinus rhythm;Bundle branch block (02/26 0751) Resp:  [14-41] 21 (02/26 0700) BP: (109-180)/(60-104) 154/68 (02/26 0700) SpO2:  [70 %-100 %] 95 % (02/26 0700) Weight:  [211 lb 1.6 oz (95.8 kg)] 211 lb 1.6 oz (95.8 kg) (02/26 0500)  Hemodynamic parameters for last 24 hours:  nsr  Intake/Output from previous day: 02/25 0701 - 02/26 0700 In: 1280 [P.O.:1080; IV Piggyback:200] Out: 1400 [Urine:1400] Intake/Output this shift: No intake/output data recorded.  Up in chair Incisions clean,dry  Lab Results: Recent Labs    06/02/17 0425 06/03/17 0418  WBC 17.2* 13.4*  HGB 10.1* 9.6*  HCT 31.5* 29.4*  PLT 85* 98*   BMET:  Recent Labs    06/02/17 0425 06/03/17 0418  NA 136 140  K 4.4 4.3  CL 105 107  CO2 23 24  GLUCOSE 105* 101*  BUN 55* 61*  CREATININE 2.70* 2.36*  CALCIUM 9.4 9.6    PT/INR: No results for input(s): LABPROT, INR in the last 72 hours. ABG    Component Value Date/Time   PHART 7.396 06/01/2017 0453   HCO3 24.5 06/01/2017 0453   TCO2 26 06/01/2017 0453   ACIDBASEDEF 2.0 05/31/2017 1255   O2SAT 95.0 06/01/2017 0453   CBG (last 3)  Recent Labs    06/02/17 1223 06/02/17 1626 06/02/17 2211  GLUCAP 84 127* 94    Assessment/Plan: S/P Procedure(s) (LRB): REPLACEMENT OF ASCENDING AORTIC ANEURYSM AND REPAIRED CHRONIC ROOT DISSECTION WITH CIRC ARREST (N/A) TRANSESOPHAGEAL ECHOCARDIOGRAM (TEE) (N/A) AORTIC VALVE REPLACEMENT (AVR) (N/A) Mobilize Diuresis BP control - see ordeers   LOS: 12 days    Martha Aquas Trigt  Myers 06/03/2017

## 2017-06-03 NOTE — Progress Notes (Signed)
Patient ID: [name], female   DOB: April 23, 1945, 72 y.o.   MRN: [id number] [facility] Evening [place]ounds  Hemodynamically stable in sinus rhythm  sats 98%  Urine output adequate.

## 2017-06-03 NOTE — Progress Notes (Signed)
Subjective: Interval History: has complaints very swollen.  Objective: Vital signs in last 24 hours: Temp:  [97.9 F (36.6 C)-99.2 F (37.3 C)] 98.2 F (36.8 C) (02/26 0300) Pulse Rate:  [67-92] 72 (02/26 0700) Resp:  [14-41] 21 (02/26 0700) BP: (109-180)/(60-104) 154/68 (02/26 0700) SpO2:  [70 %-100 %] 95 % (02/26 0700) Weight:  [95.8 kg (211 lb 1.6 oz)] 95.8 kg (211 lb 1.6 oz) (02/26 0500) Weight change: -0.346 kg (-12.2 oz)  Intake/Output from previous day: 02/25 0701 - 02/26 0700 In: 1280 [P.O.:1080; IV Piggyback:200] Out: 1400 [Urine:1400] Intake/Output this shift: No intake/output data recorded.  General appearance: alert, cooperative, moderately obese, pale and edematous Resp: diminished breath sounds bibasilar and rales bibasilar and bilaterally Cardio: S1, S2 normal and systolic murmur: systolic ejection 2/6, decrescendo at 2nd left intercostal space GI: obese, pos bs, liver down 5 cm Extremities: edema 3+  Lab Results: Recent Labs    06/02/17 0425 06/03/17 0418  WBC 17.2* 13.4*  HGB 10.1* 9.6*  HCT 31.5* 29.4*  PLT 85* 98*   BMET:  Recent Labs    06/02/17 0425 06/03/17 0418  NA 136 140  K 4.4 4.3  CL 105 107  CO2 23 24  GLUCOSE 105* 101*  BUN 55* 61*  CREATININE 2.70* 2.36*  CALCIUM 9.4 9.6   No results for input(s): PTH in the last 72 hours. Iron Studies: No results for input(s): IRON, TIBC, TRANSFERRIN, FERRITIN in the last 72 hours.  Studies/Results: No results found.  I have reviewed the patient's current medications.  Assessment/Plan: 1 CKD4 vol xs, Cr reflects dliution . Needs ^^dose loop diuretic, avoid metolazone with risk low K.  2 Anemia check Fe, may need ESa 3 HPTH check 4 AVR/Aortic root replacement 5 DM control 6 Debill 7 HTN lower vol , lower meds P po Lasix higher doses, avoid metol, follow vol K, check PTH, Fe    LOS: 12 days   Jazlin Tapscott 06/03/2017,8:33 AM

## 2017-06-03 NOTE — Progress Notes (Signed)
Occupational Therapy Evaluation Patient Details Name: [name] MRN: [id number] DOB: 11-01-1945 Today's Date: 06/03/2017    History of Present Illness Pt adm with SOB and found to have pulmonary edema and pericardial effusion. Pt found to have type A aortic dissection and aortic insufficiency and underwent AVR and repair of aortic ascending aneurysm on 05/30/17. PMH - CKD, chf, HTN, DJD   Clinical Impression   PTA, pt independent with all ADL and mobility and enjoyed "cooking and doing things around the house. " Pt currently mod A with mobility and ADL and will benefit from acute OT services to maximize functional level fo independence to facilitate safe DC home with husband. Recommend DC home with [place]. Pt completed sit - stand x 3 during session with 2/4 DOE, however O2 stable on 2L. Pt very motivated to improve.     Follow Up Recommendations  Home health OT;Supervision/Assistance - 24 hour    Equipment Recommendations  3 in 1 bedside commode(rollator)    Recommendations for Other Services       Precautions / Restrictions Precautions Precautions: Sternal;Fall Restrictions Weight Bearing Restrictions: Yes(sternal precautions)      Mobility Bed Mobility               General bed mobility comments: Pt up in the chair  Transfers Overall transfer level: Needs assistance Equipment used: Rolling walker (2 wheeled)(rollator) Transfers: Sit to/from Stand Sit to Stand: Mod assist         General transfer comment: pt with improved performance when sitting edge of chair and vc to bring feet underneath her and use momentum to help power up. A to control descent. Pt states she has knee problems    Balance Overall balance assessment: Needs assistance Sitting-balance support: No upper extremity supported;Feet supported Sitting balance-Leahy Scale: Good     Standing balance support: Bilateral upper extremity supported Standing balance-Leahy Scale: Fair Standing balance  comment: Able to stand unsupported for short period of time                           ADL either performed or assessed with clinical judgement   ADL Overall ADL's : Needs assistance/impaired Eating/Feeding: Set up;Sitting   Grooming: Moderate assistance;Sitting   Upper Body Bathing: Minimal assistance;Sitting   Lower Body Bathing: Moderate assistance;Sit to/from stand   Upper Body Dressing : Moderate assistance;Sitting   Lower Body Dressing: Moderate assistance;Sit to/from stand   Toilet Transfer: Minimal assistance;Stand-pivot(simulated)   Toileting- Clothing Manipulation and Hygiene: Moderate assistance       Functional mobility during ADLs: Rolling walker;Cueing for safety;Cueing for sequencing;Moderate assistance General ADL Comments: Began educating pt/husband on sternal precautions     Vision         Perception     Praxis      Pertinent Vitals/Pain Pain Assessment: Faces Faces Pain Scale: Hurts little more Pain Location: incisional Pain Descriptors / Indicators: Operative site guarding;Grimacing Pain Intervention(s): Limited activity within patient's tolerance     Hand Dominance Right   Extremity/Trunk Assessment Upper Extremity Assessment Upper Extremity Assessment: Generalized weakness(r hand edema)   Lower Extremity Assessment Lower Extremity Assessment: Defer to PT evaluation   Cervical / Trunk Assessment Cervical / Trunk Assessment: Kyphotic(forward head)   Communication Communication Communication: No difficulties   Cognition Arousal/Alertness: Awake/alert Behavior During Therapy: WFL for tasks assessed/performed Overall Cognitive Status: Within Functional Limits for tasks assessed  General Comments       Exercises     Shoulder Instructions      Home Living Family/patient expects to be discharged to:: Private residence Living Arrangements: Spouse/significant  other Available Help at Discharge: Family;Available 24 hours/day Type of Home: House Home Access: Stairs to enter [employer] of Steps: 1 Entrance Stairs-Rails: None Home Layout: One level     Bathroom Shower/Tub: Teacher, early years/pre: Handicapped height Bathroom Accessibility: Yes How Accessible: Accessible via walker Home Equipment: None          Prior Functioning/Environment Level of Independence: Independent        Comments: did not drive but independent with all IADL tasks        OT Problem List: Decreased activity tolerance;Decreased range of motion;Decreased strength;Decreased safety awareness;Decreased knowledge of use of DME or AE;Decreased knowledge of precautions;Cardiopulmonary status limiting activity;Obesity;Pain      OT Treatment/Interventions: Self-care/ADL training;Energy conservation;DME and/or AE instruction;Therapeutic activities;Patient/family education;Balance training    OT Goals(Current goals can be found in the care plan section) Acute Rehab OT Goals Patient Stated Goal: return home with husband OT Goal Formulation: With patient Time For Goal Achievement: 06/17/17 Potential to Achieve Goals: Good  OT Frequency: Min 2X/week   Barriers to D/C:            Co-evaluation              AM-PAC PT "6 Clicks" Daily Activity     Outcome Measure Help from another person eating meals?: None Help from another person taking care of personal grooming?: A Little Help from another person toileting, which includes using toliet, bedpan, or urinal?: A Lot Help from another person bathing (including washing, rinsing, drying)?: A Lot Help from another person to put on and taking off regular upper body clothing?: A Lot Help from another person to put on and taking off regular lower body clothing?: A Lot 6 Click Score: 15   End of Session Equipment Utilized During Treatment: Gait belt;Rolling walker;Oxygen Nurse Communication:  Mobility status  Activity Tolerance: Patient tolerated treatment well Patient left: in chair;with call bell/phone within reach;with family/visitor present  OT Visit Diagnosis: Unsteadiness on feet (R26.81);Muscle weakness (generalized) (M62.81);Pain Pain - part of body: (chest/incisional )                Time: 8329-1916 OT Time Calculation (min): 23 min Charges:  OT General Charges $OT Visit: 1 Visit OT Evaluation $OT Eval Moderate Complexity: 1 Mod OT Treatments $Self Care/Home Management : 8-22 mins G-Codes:     [facility], OT/L  [phone] 06/03/2017  Gianmarco Roye,HILLARY 06/03/2017, 3:18 PM

## 2017-06-04 ENCOUNTER — Inpatient Hospital Stay (HOSPITAL_COMMUNITY): Payer: Medicare Other

## 2017-06-04 ENCOUNTER — Other Ambulatory Visit: Payer: Self-pay

## 2017-06-04 LAB — BASIC METABOLIC PANEL
Anion gap: 10 (ref 5–15)
BUN: 70 mg/dL — ABNORMAL HIGH (ref 6–20)
CO2: 24 mmol/L (ref 22–32)
Calcium: 9.7 mg/dL (ref 8.9–10.3)
Chloride: 105 mmol/L (ref 101–111)
Creatinine, Ser: 2.32 mg/dL — ABNORMAL HIGH (ref 0.44–1.00)
GFR calc Af Amer: 23 mL/min — ABNORMAL LOW (ref >60)
GFR calc non Af Amer: 20 mL/min — ABNORMAL LOW (ref >60)
Glucose, Bld: 122 mg/dL — ABNORMAL HIGH (ref 65–99)
Potassium: 4 mmol/L (ref 3.5–5.1)
Sodium: 139 mmol/L (ref 135–145)

## 2017-06-04 LAB — GLUCOSE, CAPILLARY
GLUCOSE-CAPILLARY: 111 mg/dL — AB (ref 65–99)
GLUCOSE-CAPILLARY: 105 mg/dL — AB (ref 65–99)
Glucose-Capillary: 104 mg/dL — ABNORMAL HIGH (ref 65–99)
Glucose-Capillary: 150 mg/dL — ABNORMAL HIGH (ref 65–99)

## 2017-06-04 LAB — CBC
HCT: 27.9 % — ABNORMAL LOW (ref 36.0–46.0)
Hemoglobin: 9.2 g/dL — ABNORMAL LOW (ref 12.0–15.0)
MCH: 32.3 pg (ref 26.0–34.0)
MCHC: 33 g/dL (ref 30.0–36.0)
MCV: 97.9 fL (ref 78.0–100.0)
Platelets: 113 10*3/uL — ABNORMAL LOW (ref 150–400)
RBC: 2.85 MIL/uL — ABNORMAL LOW (ref 3.87–5.11)
RDW: 16.6 % — ABNORMAL HIGH (ref 11.5–15.5)
WBC: 10.3 10*3/uL (ref 4.0–10.5)

## 2017-06-04 LAB — PARATHYROID HORMONE, INTACT (NO CA): PTH: 148 pg/mL — AB (ref 15–65)

## 2017-06-04 MED ORDER — METOPROLOL TARTRATE 5 MG/5ML IV SOLN
INTRAVENOUS | Status: AC
  Administered 2017-06-04: 5 mg
  Filled 2017-06-04: qty 5

## 2017-06-04 MED ORDER — AMIODARONE LOAD VIA INFUSION
150.0000 mg | Freq: Once | INTRAVENOUS | Status: AC
  Administered 2017-06-04: 150 mg via INTRAVENOUS
  Filled 2017-06-04: qty 83.34

## 2017-06-04 MED ORDER — CHLORHEXIDINE GLUCONATE CLOTH 2 % EX PADS
6.0000 | MEDICATED_PAD | Freq: Every day | CUTANEOUS | Status: DC
  Administered 2017-06-04 – 2017-06-07 (×4): 6 via TOPICAL

## 2017-06-04 MED ORDER — AMIODARONE HCL IN DEXTROSE 360-4.14 MG/200ML-% IV SOLN
60.0000 mg/h | INTRAVENOUS | Status: AC
  Administered 2017-06-04: 60 mg/h via INTRAVENOUS

## 2017-06-04 MED ORDER — SODIUM CHLORIDE 0.9 % IV SOLN
510.0000 mg | Freq: Once | INTRAVENOUS | Status: AC
  Administered 2017-06-04: 510 mg via INTRAVENOUS
  Filled 2017-06-04: qty 17

## 2017-06-04 MED ORDER — SODIUM CHLORIDE 0.9% FLUSH: 10.0000 mL | INTRAVENOUS | Status: DC | PRN

## 2017-06-04 MED ORDER — AMIODARONE HCL IN DEXTROSE 360-4.14 MG/200ML-% IV SOLN
INTRAVENOUS | Status: AC
  Filled 2017-06-04: qty 400

## 2017-06-04 MED ORDER — SODIUM CHLORIDE 0.9% FLUSH
10.0000 mL | Freq: Two times a day (BID) | INTRAVENOUS | Status: DC
  Administered 2017-06-04 – 2017-06-06 (×3): 10 mL
  Administered 2017-06-06: 40 mL
  Administered 2017-06-07: 10 mL

## 2017-06-04 MED ORDER — AMIODARONE HCL IN DEXTROSE 360-4.14 MG/200ML-% IV SOLN
30.0000 mg/h | INTRAVENOUS | Status: DC
  Administered 2017-06-04 (×2): 30 mg/h via INTRAVENOUS
  Filled 2017-06-04: qty 200

## 2017-06-04 MED ORDER — AMIODARONE HCL 200 MG PO TABS
200.0000 mg | ORAL_TABLET | Freq: Two times a day (BID) | ORAL | Status: DC
  Administered 2017-06-04 (×2): 200 mg via ORAL
  Filled 2017-06-04 (×3): qty 1

## 2017-06-04 MED ORDER — DARBEPOETIN ALFA 100 MCG/0.5ML IJ SOSY
100.0000 ug | PREFILLED_SYRINGE | INTRAMUSCULAR | Status: DC
  Filled 2017-06-04: qty 0.5

## 2017-06-04 NOTE — Progress Notes (Signed)
Pt converted to NSR, SB in the 50s.

## 2017-06-04 NOTE — Progress Notes (Signed)
Patient went back into afib RVR.  EKG obtained and read by physician.  Amiodarone bolus orders given per Cyndia Bent MD

## 2017-06-04 NOTE — Progress Notes (Signed)
0100 -- Patient was helped to bedside commode and her HR to decreased to the 60s.  She was also dropping QRS complexes every so often.  She sustained this rhythm change.  (All night and day patient had been in SR 70s-80s)  EKG was obtained for concern of heart block.  EKG showed SR with sinus arrhythmia.  I attached the patient to the pacer in case she became bradycardic.    0120 -- Patient was helped to bed, saying she was restless.    0130 -- Patient went into what appeared to be Vtach in the 150s.   I instructed the patient to bear down and cough.  She was attached to zoll.  Bartle MD paged.  Her rhythm started to look more indicative of afib RVR. 5 mg of metoprolol was given with no decrease in HR. (MD aware)  Patient remained in her normal mental status throughout change in rhythm.  HR 130s.  Lungs diminished, patient tachypneic, but she has been with movement. Mechanical valve click sound present on assessment, same as previous assessment.   Bartle MD was made aware of change in rhythm.  While on the phone with him, the patient was in [place].  Orders for amiodarone bolus with continuous amiodarone at 60 mg post-bolus were given to help convert patient. RN told to call back if no improvement was seen.  No other orders given at this time

## 2017-06-04 NOTE — Progress Notes (Signed)
5 Days Post-Op Procedure(s) (LRB): REPLACEMENT OF ASCENDING AORTIC ANEURYSM AND REPAIRED CHRONIC ROOT DISSECTION WITH CIRC ARREST (N/A) TRANSESOPHAGEAL ECHOCARDIOGRAM (TEE) (N/A) AORTIC VALVE REPLACEMENT (AVR) (N/A) Subjective: Rapid afib last nite now slow junctional on iv amiodarone 40/min Will hold betablocker, stop IV amio AV pacing with good threshold, stable BP CXR with imroved edema CKD followed by renal Objective: Vital signs in last 24 hours: Temp:  [97.8 F (36.6 C)-99.3 F (37.4 C)] 97.8 F (36.6 C) (02/27 0300) Pulse Rate:  [29-132] 59 (02/27 0800) Cardiac Rhythm: A-V Sequential paced (02/27 0800) Resp:  [14-39] 22 (02/27 0800) BP: (109-167)/(54-100) 128/66 (02/27 0800) SpO2:  [85 %-100 %] 100 % (02/27 0804) Weight:  [207 lb 4.8 oz (94 kg)] 207 lb 4.8 oz (94 kg) (02/27 0500)  Hemodynamic parameters for last 24 hours:    Intake/Output from previous day: 02/26 0701 - 02/27 0700 In: 1384.4 [P.O.:600; I.V.:584.4; IV Piggyback:200] Out: 1125 [Urine:1125] Intake/Output this shift: Total I/O In: 33.3 [I.V.:33.3] Out: -   Up in chair 2+ leg edema Incision clean,dry Neuro intact Lab Results: Recent Labs    06/03/17 0418 06/04/17 0252  WBC 13.4* 10.3  HGB 9.6* 9.2*  HCT 29.4* 27.9*  PLT 98* 113*   BMET:  Recent Labs    06/03/17 0418 06/04/17 0252  NA 140 139  K 4.3 4.0  CL 107 105  CO2 24 24  GLUCOSE 101* 122*  BUN 61* 70*  CREATININE 2.36* 2.32*  CALCIUM 9.6 9.7    PT/INR: No results for input(s): LABPROT, INR in the last 72 hours. ABG    Component Value Date/Time   PHART 7.396 06/01/2017 0453   HCO3 24.5 06/01/2017 0453   TCO2 26 06/01/2017 0453   ACIDBASEDEF 2.0 05/31/2017 1255   O2SAT 95.0 06/01/2017 0453   CBG (last 3)  Recent Labs    06/03/17 1549 06/03/17 2225 06/04/17 0842  GLUCAP 106* 104* 104*    Assessment/Plan: S/P Procedure(s) (LRB): REPLACEMENT OF ASCENDING AORTIC ANEURYSM AND REPAIRED CHRONIC ROOT DISSECTION WITH  CIRC ARREST (N/A) TRANSESOPHAGEAL ECHOCARDIOGRAM (TEE) (N/A) AORTIC VALVE REPLACEMENT (AVR) (N/A) Cont temp pacer, reduce amio dose Was in NSR several days so would not expect to need Perm pacer Keep in ICU for pacer dependent HR  LOS: 13 days    Tharon Aquas Trigt III 06/04/2017

## 2017-06-04 NOTE — Progress Notes (Signed)
Patient ID: [name], female   DOB: 27-Jun-1945, 72 y.o.   MRN: [id number] EVENING ROUNDS NOTE :     [address]       [place] [address]             [phone]                 5 Days Post-Op Procedure(s) (LRB): REPLACEMENT OF ASCENDING AORTIC ANEURYSM AND REPAIRED CHRONIC ROOT DISSECTION WITH CIRC ARREST (N/A) TRANSESOPHAGEAL ECHOCARDIOGRAM (TEE) (N/A) AORTIC VALVE REPLACEMENT (AVR) (N/A)  Total Length of Stay:  LOS: 13 days  BP 109/70 (BP Location: Right Arm)   Pulse 84   Temp 98.6 F (37 C) (Oral)   Resp 18   Ht 5\' 6"  (1.676 m)   Wt 207 lb 4.8 oz (94 kg)   SpO2 99%   BMI 33.46 kg/m   .Intake/Output      02/26 0701 - 02/27 0700 02/27 0701 - 02/28 0700   P.O. 600    I.V. (mL/kg) 584.4 (6.2) 33.3 (0.4)   IV Piggyback 200 200   Total Intake(mL/kg) 1384.4 (14.7) 233.3 (2.5)   Urine (mL/kg/hr) 1125 (0.5) 150 (0.1)   Stool     Total Output 1125 150   Net +259.4 +83.3        Urine Occurrence 3 x 2 x     . sodium chloride    . cefTAZidime (FORTAZ)  IV Stopped (06/04/17 1151)     Lab Results  Component Value Date   WBC 10.3 06/04/2017   HGB 9.2 (L) 06/04/2017   HCT 27.9 (L) 06/04/2017   PLT 113 (L) 06/04/2017   GLUCOSE 122 (H) 06/04/2017   CHOL 141 02/04/2017   TRIG 78 02/04/2017   HDL 49 02/04/2017   LDLCALC 76 02/04/2017   ALT 31 06/03/2017   AST 33 06/03/2017   NA 139 06/04/2017   K 4.0 06/04/2017   CL 105 06/04/2017   CREATININE 2.32 (H) 06/04/2017   BUN 70 (H) 06/04/2017   CO2 24 06/04/2017   TSH 0.703 05/29/2017   INR 0.83 05/30/2017   HGBA1C 5.3 05/30/2017   Says sob  Is better Av paced now  Cr 2.32   Grace Isaac MD  Beeper [phone] Office [phone] 06/04/2017 6:52 PM

## 2017-06-04 NOTE — Progress Notes (Signed)
Subjective: Interval History: has no complaint, making more urine.  Objective: Vital signs in last 24 hours: Temp:  [97.8 F (36.6 C)-99.3 F (37.4 C)] 97.8 F (36.6 C) (02/27 0300) Pulse Rate:  [29-132] 59 (02/27 0800) Resp:  [14-39] 22 (02/27 0800) BP: (109-167)/(54-100) 128/66 (02/27 0800) SpO2:  [85 %-100 %] 100 % (02/27 0804) Weight:  [94 kg (207 lb 4.8 oz)] 94 kg (207 lb 4.8 oz) (02/27 0500) Weight change: -1.724 kg (-12.8 oz)  Intake/Output from previous day: 02/26 0701 - 02/27 0700 In: 1384.4 [P.O.:600; I.V.:584.4; IV Piggyback:200] Out: 1125 [Urine:1125] Intake/Output this shift: Total I/O In: 33.3 [I.V.:33.3] Out: -   General appearance: alert, cooperative, no distress and moderately obese Resp: rales bibasilar Cardio: S1, S2 normal, systolic murmur: systolic ejection 2/6, decrescendo at 2nd left intercostal space and paced GI: obese, pos bs, soft, liver down 5 cm Extremities: edema 2+  Lab Results: Recent Labs    06/03/17 0418 06/04/17 0252  WBC 13.4* 10.3  HGB 9.6* 9.2*  HCT 29.4* 27.9*  PLT 98* 113*   BMET:  Recent Labs    06/03/17 0418 06/04/17 0252  NA 140 139  K 4.3 4.0  CL 107 105  CO2 24 24  GLUCOSE 101* 122*  BUN 61* 70*  CREATININE 2.36* 2.32*  CALCIUM 9.6 9.7   No results for input(s): PTH in the last 72 hours. Iron Studies:  Recent Labs    06/03/17 1025  IRON 18*  TIBC 126*    Studies/Results: Dg Chest Port 1 View  Result Date: 06/03/2017 CLINICAL DATA:  Cough and congestion with shortness of breath EXAM: PORTABLE CHEST 1 VIEW COMPARISON:  June 01, 2017 FINDINGS: Chest tubes bilaterally have been removed. Cordis has been removed. Left subclavian catheter tip is in the left innominate vein, stable. Mediastinal drain has been removed. No pneumothorax. There are pleural effusions bilaterally with patchy airspace consolidation in the left mid lower lung zones. There is mild atelectasis in the right base. There is stable  cardiomegaly. There is an aortic valve replacement. No adenopathy. There is aortic atherosclerosis. No bone lesions. IMPRESSION: Several tubes and catheters have been removed. Left subclavian catheter currently with tip in left innominate vein. No pneumothorax. Bilateral pleural effusions noted with patchy atelectasis and consolidation in the left mid lower lung zones as well as right base atelectasis. Stable cardiac prominence. There is aortic atherosclerosis. Aortic Atherosclerosis (ICD10-I70.0). Electronically Signed   By: Lowella Grip III M.D.   On: 06/03/2017 08:55    I have reviewed the patient's current medications.  Assessment/Plan: 1 CKD 4 diuresing , tol well, K ok.  Still 15-20 lb up. 2 Anemia Fe low bolus and esa 3 HPTH 4 Aortic dissection/AVR repair per CVS 5 DM 6 obesity P cont lasix, give Fe, esa/    LOS: 13 days   [name] 06/04/2017,8:11 AM

## 2017-06-05 ENCOUNTER — Inpatient Hospital Stay (HOSPITAL_COMMUNITY): Payer: Medicare Other

## 2017-06-05 LAB — ECHO TEE
AO mean calculated velocity dopler: 150 cm/s
AV Mean grad: 11 mmHg
AV Peak grad: 25 mmHg
AV pk vel: 251 cm/s
P 1/2 time: 266 ms
VTI: 53.6 cm

## 2017-06-05 LAB — GLUCOSE, CAPILLARY
GLUCOSE-CAPILLARY: 88 mg/dL (ref 65–99)
Glucose-Capillary: 91 mg/dL (ref 65–99)
Glucose-Capillary: 99 mg/dL (ref 65–99)
Glucose-Capillary: 109 mg/dL — ABNORMAL HIGH (ref 65–99)

## 2017-06-05 LAB — CBC
HCT: 27.7 % — ABNORMAL LOW (ref 36.0–46.0)
Hemoglobin: 8.9 g/dL — ABNORMAL LOW (ref 12.0–15.0)
MCH: 31 pg (ref 26.0–34.0)
MCHC: 32.1 g/dL (ref 30.0–36.0)
MCV: 96.5 fL (ref 78.0–100.0)
Platelets: 143 10*3/uL — ABNORMAL LOW (ref 150–400)
RBC: 2.87 MIL/uL — ABNORMAL LOW (ref 3.87–5.11)
RDW: 16.2 % — ABNORMAL HIGH (ref 11.5–15.5)
WBC: 9 10*3/uL (ref 4.0–10.5)

## 2017-06-05 LAB — RENAL FUNCTION PANEL
ALBUMIN: 2.3 g/dL — AB (ref 3.5–5.0)
Anion gap: 10 (ref 5–15)
BUN: 77 mg/dL — AB (ref 6–20)
CO2: 25 mmol/L (ref 22–32)
CREATININE: 2.5 mg/dL — AB (ref 0.44–1.00)
Calcium: 9.8 mg/dL (ref 8.9–10.3)
Chloride: 105 mmol/L (ref 101–111)
GFR calc Af Amer: 21 mL/min — ABNORMAL LOW (ref >60)
GFR calc non Af Amer: 18 mL/min — ABNORMAL LOW (ref >60)
GLUCOSE: 121 mg/dL — AB (ref 65–99)
PHOSPHORUS: 3.7 mg/dL (ref 2.5–4.6)
Potassium: 4.1 mmol/L (ref 3.5–5.1)
SODIUM: 140 mmol/L (ref 135–145)

## 2017-06-05 MED ORDER — AMIODARONE HCL 200 MG PO TABS
200.0000 mg | ORAL_TABLET | Freq: Every day | ORAL | Status: DC
  Administered 2017-06-05 – 2017-06-06 (×2): 200 mg via ORAL
  Filled 2017-06-05: qty 1

## 2017-06-05 MED ORDER — HYDRALAZINE HCL 10 MG PO TABS
10.0000 mg | ORAL_TABLET | Freq: Two times a day (BID) | ORAL | Status: DC
  Administered 2017-06-05 – 2017-06-16 (×22): 10 mg via ORAL
  Filled 2017-06-05 (×23): qty 1

## 2017-06-05 MED ORDER — SODIUM CHLORIDE 0.9 % IV SOLN
1.0000 g | Freq: Two times a day (BID) | INTRAVENOUS | Status: AC
  Administered 2017-06-05 – 2017-06-06 (×4): 1 g via INTRAVENOUS
  Filled 2017-06-05 (×4): qty 1

## 2017-06-05 MED ORDER — CALCITRIOL 0.25 MCG PO CAPS
0.2500 ug | ORAL_CAPSULE | Freq: Every day | ORAL | Status: DC
  Administered 2017-06-05 – 2017-06-11 (×7): 0.25 ug via ORAL
  Filled 2017-06-05 (×7): qty 1

## 2017-06-05 MED ORDER — ENOXAPARIN SODIUM 30 MG/0.3ML ~~LOC~~ SOLN
30.0000 mg | SUBCUTANEOUS | Status: DC
Start: 2017-06-05 — End: 2017-06-16
  Administered 2017-06-05 – 2017-06-15 (×11): 30 mg via SUBCUTANEOUS
  Filled 2017-06-05 (×11): qty 0.3

## 2017-06-05 MED ORDER — HYDRALAZINE HCL 25 MG PO TABS: 25.0000 mg | ORAL_TABLET | Freq: Two times a day (BID) | ORAL | Status: DC

## 2017-06-05 MED ORDER — FUROSEMIDE 80 MG PO TABS
160.0000 mg | ORAL_TABLET | Freq: Three times a day (TID) | ORAL | Status: DC
  Administered 2017-06-05 – 2017-06-10 (×18): 160 mg via ORAL
  Filled 2017-06-05 (×18): qty 2

## 2017-06-05 NOTE — Addendum Note (Signed)
Addendum  created 06/05/17 1010 by Audry Pili, MD   Diagnosis association updated

## 2017-06-05 NOTE — Progress Notes (Signed)
Physical Therapy Treatment Patient Details Name: [name] MRN: [id number] DOB: 13-Mar-1946 Today's Date: 06/05/2017    History of Present Illness Pt adm with SOB and found to have pulmonary edema and pericardial effusion. Pt found to have type A aortic dissection and aortic insufficiency and underwent AVR and repair of aortic ascending aneurysm on 05/30/17. PMH - CKD, chf, HTN, DJD    PT Comments    Pt performed decreased activity with c/o pain in feet in standing but subsides in sitting.  Pt remains to require education on sternal precautions and how to maintain these precautions during tx.  Pt's largest limiting factor is her ability to stand as she continues to require mod assistance to stand.  Plan to return home with support from her spouse and HHPT.  Pt on temp pacer during session after having elevated HR last pm.    Follow Up Recommendations  Home health PT;Supervision/Assistance - 24 hour     Equipment Recommendations  Other (comment)(4 wheeled RW with a seat.)    Recommendations for Other Services       Precautions / Restrictions Precautions Precautions: Sternal;Fall Restrictions Weight Bearing Restrictions: Yes(sternal precautions)    Mobility  Bed Mobility               General bed mobility comments: Pt up in the chair  Transfers Overall transfer level: Needs assistance Equipment used: 4-wheeled walker(rollator) Transfers: Sit to/from Stand Sit to Stand: Mod assist         General transfer comment: Pt with max Vcs to push from her knees and not reach for her RW.  Pt continues to require significant assistance to elevate into standing.  Poor eccentric loading when returning to chair.    Ambulation/Gait Ambulation/Gait assistance: Min guard Ambulation Distance (Feet): 80 Feet Assistive device: 4-wheeled walker Gait Pattern/deviations: Step-through pattern;Decreased step length - right;Decreased step length - left;Shuffle;Trunk flexed Gait velocity:  decr Gait velocity interpretation: Below normal speed for age/gender General Gait Details: Assist for balance. Verbal cues to stand more erect. Pt amb on  1L of O2 with dyspnea 2/4 and SpO2 >90%.  Pt required standing rest break x1.  Run of vtach reported on monitor but appears to be artifact, nursing informed.    Stairs            Wheelchair Mobility    Modified Rankin (Stroke Patients Only)       Balance Overall balance assessment: Needs assistance   Sitting balance-Leahy Scale: Good       Standing balance-Leahy Scale: Fair Standing balance comment: Able to stand unsupported for short period of time                            Cognition Arousal/Alertness: Awake/alert Behavior During Therapy: WFL for tasks assessed/performed Overall Cognitive Status: Within Functional Limits for tasks assessed                                        Exercises      General Comments        Pertinent Vitals/Pain Pain Assessment: Faces Faces Pain Scale: Hurts a little bit Pain Location: incisional Pain Descriptors / Indicators: Operative site guarding Pain Intervention(s): Monitored during session;Repositioned    Home Living  Prior Function            PT Goals (current goals can now be found in the care plan section) Acute Rehab PT Goals Patient Stated Goal: return home with husband Potential to Achieve Goals: Good Progress towards PT goals: Progressing toward goals    Frequency    Min 3X/week      PT Plan Current plan remains appropriate    Co-evaluation              AM-PAC PT "6 Clicks" Daily Activity  Outcome Measure  Difficulty turning over in bed (including adjusting bedclothes, sheets and blankets)?: Unable Difficulty moving from lying on back to sitting on the side of the bed? : Unable Difficulty sitting down on and standing up from a chair with arms (e.g., wheelchair, bedside commode,  etc,.)?: Unable Help needed moving to and from a bed to chair (including a wheelchair)?: A Lot Help needed walking in hospital room?: A Little Help needed climbing 3-5 steps with a railing? : A Little 6 Click Score: 11    End of Session Equipment Utilized During Treatment: Gait belt;Oxygen Activity Tolerance: Patient limited by fatigue Patient left: in chair;with call bell/phone within reach;with nursing/sitter in room Nurse Communication: Mobility status PT Visit Diagnosis: Unsteadiness on feet (R26.81);Other abnormalities of gait and mobility (R26.89);Muscle weakness (generalized) (M62.81)     Time: 4259-5638 PT Time Calculation (min) (ACUTE ONLY): 28 min  Charges:  $Gait Training: 8-22 mins $Therapeutic Activity: 8-22 mins                    G Codes:       [name], PTA pager [phone]    [name] 06/05/2017, 3:50 PM

## 2017-06-05 NOTE — Progress Notes (Signed)
Subjective: Interval History: has no complaint, making a lot of urine, not measuring all.  Objective: Vital signs in last 24 hours: Temp:  [97.6 F (36.4 C)-98.6 F (37 C)] 97.6 F (36.4 C) (02/28 0418) Pulse Rate:  [59-85] 84 (02/28 0500) Resp:  [13-27] 15 (02/28 0500) BP: (107-158)/(60-86) 132/79 (02/28 0500) SpO2:  [96 %-100 %] 97 % (02/28 0500) Weight:  [94.4 kg (208 lb 1.6 oz)] 94.4 kg (208 lb 1.6 oz) (02/28 0453) Weight change: 0.363 kg (12.8 oz)  Intake/Output from previous day: 02/27 0701 - 02/28 0700 In: 383.3 [P.O.:150; I.V.:33.3; IV Piggyback:200] Out: 575 [Urine:575] Intake/Output this shift: No intake/output data recorded.  General appearance: alert, cooperative, no distress and moderately obese Resp: rales bibasilar Cardio: S1, S2 normal and systolic murmur: systolic ejection 3/6, decrescendo at 2nd left intercostal space GI: obese, pos bs, liver down 6 cm Extremities: edema 3+  Lab Results: Recent Labs    06/04/17 0252 06/05/17 0306  WBC 10.3 9.0  HGB 9.2* 8.9*  HCT 27.9* 27.7*  PLT 113* 143*   BMET:  Recent Labs    06/04/17 0252 06/05/17 0306  NA 139 140  K 4.0 4.1  CL 105 105  CO2 24 25  GLUCOSE 122* 121*  BUN 70* 77*  CREATININE 2.32* 2.50*  CALCIUM 9.7 9.8   Recent Labs    06/03/17 1025  PTH 148*   Iron Studies:  Recent Labs    06/03/17 1025  IRON 18*  TIBC 126*    Studies/Results: Dg Chest 2 View  Result Date: 06/04/2017 CLINICAL DATA:  Status post aortic valve replacement. EXAM: CHEST  2 VIEW COMPARISON:  Radiograph of June 03, 2017. FINDINGS: Stable cardiomegaly. Left subclavian catheter is unchanged in position. Aortic valve prosthesis is noted. No pneumothorax is noted. Stable bibasilar opacities are noted most consistent with atelectasis with associated pleural effusions. Bony thorax is unremarkable. IMPRESSION: Stable bibasilar subsegmental atelectasis is noted with associated pleural effusions. Electronically Signed    By: Marijo Conception, M.D.   On: 06/04/2017 10:25    I have reviewed the patient's current medications.  Assessment/Plan: 1 CKD4 vol xs , needs diuresis,  Will ^ dose.  K and acid/base ok 2 Anemia given esa/Fe 3 HPTH needs vit D 4 AVR/Aortic dissection repair 5 DM controlled 6 Obesity 7 bp falling , lower meds P ^ lasix, follow k, bicarb, diuresis, mobilize,      LOS: 14 days   Marlean Mortell 06/05/2017,7:51 AM

## 2017-06-06 ENCOUNTER — Inpatient Hospital Stay (HOSPITAL_COMMUNITY): Payer: Medicare Other

## 2017-06-06 LAB — GLUCOSE, CAPILLARY
GLUCOSE-CAPILLARY: 64 mg/dL — AB (ref 65–99)
Glucose-Capillary: 89 mg/dL (ref 65–99)
Glucose-Capillary: 95 mg/dL (ref 65–99)

## 2017-06-06 LAB — CBC
HCT: 27.3 % — ABNORMAL LOW (ref 36.0–46.0)
Hemoglobin: 8.7 g/dL — ABNORMAL LOW (ref 12.0–15.0)
MCH: 30.7 pg (ref 26.0–34.0)
MCHC: 31.9 g/dL (ref 30.0–36.0)
MCV: 96.5 fL (ref 78.0–100.0)
Platelets: 138 10*3/uL — ABNORMAL LOW (ref 150–400)
RBC: 2.83 MIL/uL — ABNORMAL LOW (ref 3.87–5.11)
RDW: 16.1 % — ABNORMAL HIGH (ref 11.5–15.5)
WBC: 9.7 10*3/uL (ref 4.0–10.5)

## 2017-06-06 LAB — POCT I-STAT, CHEM 8
BUN: 83 mg/dL — AB (ref 6–20)
CALCIUM ION: 1.38 mmol/L (ref 1.15–1.40)
CHLORIDE: 103 mmol/L (ref 101–111)
Creatinine, Ser: 2.8 mg/dL — ABNORMAL HIGH (ref 0.44–1.00)
GLUCOSE: 100 mg/dL — AB (ref 65–99)
HCT: 25 % — ABNORMAL LOW (ref 36.0–46.0)
Hemoglobin: 8.5 g/dL — ABNORMAL LOW (ref 12.0–15.0)
Potassium: 3.8 mmol/L (ref 3.5–5.1)
Sodium: 142 mmol/L (ref 135–145)
TCO2: 28 mmol/L (ref 22–32)

## 2017-06-06 LAB — RENAL FUNCTION PANEL
ANION GAP: 11 (ref 5–15)
Albumin: 2.3 g/dL — ABNORMAL LOW (ref 3.5–5.0)
BUN: 85 mg/dL — ABNORMAL HIGH (ref 6–20)
CHLORIDE: 105 mmol/L (ref 101–111)
CO2: 24 mmol/L (ref 22–32)
Calcium: 9.8 mg/dL (ref 8.9–10.3)
Creatinine, Ser: 2.69 mg/dL — ABNORMAL HIGH (ref 0.44–1.00)
GFR calc non Af Amer: 17 mL/min — ABNORMAL LOW (ref >60)
GFR, EST AFRICAN AMERICAN: 19 mL/min — AB (ref >60)
Glucose, Bld: 108 mg/dL — ABNORMAL HIGH (ref 65–99)
POTASSIUM: 3.8 mmol/L (ref 3.5–5.1)
Phosphorus: 3.6 mg/dL (ref 2.5–4.6)
Sodium: 140 mmol/L (ref 135–145)

## 2017-06-06 MED ORDER — METOPROLOL TARTRATE 5 MG/5ML IV SOLN
INTRAVENOUS | Status: AC
  Administered 2017-06-06: 5 mg
  Filled 2017-06-06: qty 5

## 2017-06-06 MED ORDER — AMIODARONE IV BOLUS ONLY 150 MG/100ML
150.0000 mg | Freq: Once | INTRAVENOUS | Status: AC
  Administered 2017-06-06: 150 mg via INTRAVENOUS

## 2017-06-06 MED ORDER — AMIODARONE HCL IN DEXTROSE 360-4.14 MG/200ML-% IV SOLN
INTRAVENOUS | Status: AC
  Filled 2017-06-06: qty 200

## 2017-06-06 MED ORDER — DARBEPOETIN ALFA 100 MCG/0.5ML IJ SOSY
100.0000 ug | PREFILLED_SYRINGE | INTRAMUSCULAR | Status: DC
  Filled 2017-06-06 (×2): qty 0.5

## 2017-06-06 MED ORDER — METOPROLOL TARTRATE 5 MG/5ML IV SOLN: 5.0000 mg | Freq: Once | INTRAVENOUS | Status: AC

## 2017-06-06 NOTE — Progress Notes (Signed)
Subjective: Interval History: has no complaint, putting out a lot of urine.  Objective: Vital signs in last 24 hours: Temp:  [97.4 F (36.3 C)-98.5 F (36.9 C)] 98.5 F (36.9 C) (03/01 0808) Pulse Rate:  [71-86] 81 (03/01 0808) Resp:  [11-30] 30 (03/01 0808) BP: (101-164)/(54-86) 143/81 (03/01 0808) SpO2:  [93 %-100 %] 98 % (03/01 0808) Weight:  [93.6 kg (206 lb 4.8 oz)] 93.6 kg (206 lb 4.8 oz) (03/01 0500) Weight change: -0.816 kg (-12.8 oz)  Intake/Output from previous day: 02/28 0701 - 03/01 0700 In: 550 [P.O.:450; IV Piggyback:100] Out: 1250 [Urine:1250] Intake/Output this shift: Total I/O In: -  Out: 125 [Urine:125]  General appearance: alert, cooperative, no distress, moderately obese and pale Resp: rales bibasilar Cardio: irregularly irregular rhythm, S1, S2 normal and systolic murmur: systolic ejection 3/6, decrescendo at 2nd left intercostal space GI: obese, pos bs, soft,  Extremities: edema 3+  Lab Results: Recent Labs    06/05/17 0306 06/06/17 0400  WBC 9.0 9.7  HGB 8.9* 8.7*  HCT 27.7* 27.3*  PLT 143* 138*   BMET:  Recent Labs    06/05/17 0306 06/06/17 0400  NA 140 140  K 4.1 3.8  CL 105 105  CO2 25 24  GLUCOSE 121* 108*  BUN 77* 85*  CREATININE 2.50* 2.69*  CALCIUM 9.8 9.8   Recent Labs    06/03/17 1025  PTH 148*   Iron Studies:  Recent Labs    06/03/17 1025  IRON 18*  TIBC 126*    Studies/Results: Dg Chest Port 1 View  Result Date: 06/06/2017 CLINICAL DATA:  Aortic valve replacement. EXAM: PORTABLE CHEST 1 VIEW COMPARISON:  06/05/2017. FINDINGS: Surgical staples and clips noted over the right chest. Prior median sternotomy. Prior cardiac valve replacement. Stable cardiomegaly. Persistent bibasilar atelectasis/infiltrates and bilateral pleural effusions. No change. No pneumothorax. IMPRESSION: Prior cardiac valve replacement.  Stable cardiomegaly. 2. Persistent bibasilar atelectasis/infiltrates and bilateral pleural effusions again  noted without interim change. Electronically Signed   By: Marcello Moores  Register   On: 06/06/2017 07:45   Dg Chest Port 1 View  Result Date: 06/05/2017 CLINICAL DATA:  Status post AVR EXAM: PORTABLE CHEST 1 VIEW COMPARISON:  Chest radiograph from one day prior. FINDINGS: Intact sternotomy wires. Aortic valve prosthesis is in place. Left subclavian central venous catheter terminates over upper third of the superior vena cava. Surgical clips and staples overlie the right upper chest. Stable cardiomediastinal silhouette with mild cardiomegaly. No pneumothorax. Small bilateral pleural effusions, left greater than right, stable. Cephalization of the pulmonary vasculature without overt pulmonary edema. Moderate bibasilar atelectasis is stable. IMPRESSION: 1. Stable small bilateral pleural effusions with moderate bibasilar atelectasis. 2. Stable cardiomegaly without overt pulmonary edema. Electronically Signed   By: Ilona Sorrel M.D.   On: 06/05/2017 08:00    I have reviewed the patient's current medications.  Assessment/Plan: 1 CKD 4 diuresing 1-2 lb /d  Cr mildly ^. Breathing easier 2 S/P Aortic repair and AVR per CVS 3 Anemia Fe/esa 4 HTPTH vit D 5 Obesity 6 DM controlled P Lasix follow K, Cr, mobilize    LOS: 15 days   Jeneen Rinks Rogina Schiano 06/06/2017,8:24 AM

## 2017-06-06 NOTE — Progress Notes (Signed)
Patient ID: [name], female   DOB: [birth year], 72 y.o.   MRN: [id number] TCTS DAILY ICU PROGRESS NOTE                   [address]            [place] [address]          [phone]   7 Days Post-Op Procedure(s) (LRB): REPLACEMENT OF ASCENDING AORTIC ANEURYSM AND REPAIRED CHRONIC ROOT DISSECTION WITH CIRC ARREST (N/A) TRANSESOPHAGEAL ECHOCARDIOGRAM (TEE) (N/A) AORTIC VALVE REPLACEMENT (AVR) (N/A)  Total Length of Stay:  LOS: 15 days   Subjective: Patient awake and alert neurologically intact knows her history in detail, notes she has been seeing Dr. better doing for more than 10 years for  Objective: Vital signs in last 24 hours: Temp:  [97.4 F (36.3 C)-98.5 F (36.9 C)] 98.5 F (36.9 C) (03/01 0808) Pulse Rate:  [75-86] 84 (03/01 0823) Cardiac Rhythm: Normal sinus rhythm (03/01 0800) Resp:  [11-30] 22 (03/01 0823) BP: (101-164)/(54-86) 143/81 (03/01 0823) SpO2:  [93 %-100 %] 96 % (03/01 0823) Weight:  [206 lb 4.8 oz (93.6 kg)] 206 lb 4.8 oz (93.6 kg) (03/01 0500)  Filed Weights   06/04/17 0500 06/05/17 0453 06/06/17 0500  Weight: 207 lb 4.8 oz (94 kg) 208 lb 1.6 oz (94.4 kg) 206 lb 4.8 oz (93.6 kg)    Weight change: -12.8 oz (-0.816 kg)   Hemodynamic parameters for last 24 hours:    Intake/Output from previous day: 02/28 0701 - 03/01 0700 In: 550 [P.O.:450; IV Piggyback:100] Out: 1250 [Urine:1250]  Intake/Output this shift: Total I/O In: 240 [P.O.:240] Out: 125 [Urine:125]  Current Meds: Scheduled Meds: . amiodarone  200 mg Oral Daily  . aspirin EC  325 mg Oral Daily  . bisacodyl  10 mg Oral Daily   Or  . bisacodyl  10 mg Rectal Daily  . calcitRIOL  0.25 mcg Oral Daily  . Chlorhexidine Gluconate Cloth  6 each Topical Daily  . darbepoetin (ARANESP) injection - NON-DIALYSIS  100 mcg Subcutaneous Q Wed-1800  . docusate sodium  200 mg Oral Daily  . enoxaparin (LOVENOX) injection  30 mg Subcutaneous Q24H  . furosemide  160 mg Oral TID   . hydrALAZINE  10 mg Oral BID  . insulin aspart  0-24 Units Subcutaneous TID AC & HS  . mouth rinse  15 mL Mouth Rinse BID  . metoCLOPramide (REGLAN) injection  10 mg Intravenous Q6H  . mometasone-formoterol  2 puff Inhalation BID  . moving right along book   Does not apply Once  . pantoprazole  40 mg Oral Daily  . sodium chloride flush  10-40 mL Intracatheter Q12H  . sodium chloride flush  3 mL Intravenous Q12H   Continuous Infusions: . sodium chloride    . cefTAZidime (FORTAZ)  IV 1 g (06/06/17 0907)   PRN Meds:.gabapentin, hydrALAZINE, ondansetron (ZOFRAN) IV, oxyCODONE, sodium chloride flush, sodium chloride flush, traMADol  General appearance: alert, cooperative and no distress Neurologic: intact Heart: regular rate and rhythm, S1, S2 normal, no murmur, click, rub or gallop Lungs: diminished breath sounds bibasilar Abdomen: soft, non-tender; bowel sounds normal; no masses,  no organomegaly Extremities: extremities normal, atraumatic, no cyanosis or edema and Homans sign is negative, no sign of DVT Wound: Sternum stable wound intact  Lab Results: CBC: Recent Labs    06/05/17 0306 06/06/17 0400  WBC 9.0 9.7  HGB 8.9* 8.7*  HCT 27.7* 27.3*  PLT 143* 138*  BMET:  Recent Labs    06/05/17 0306 06/06/17 0400  NA 140 140  K 4.1 3.8  CL 105 105  CO2 25 24  GLUCOSE 121* 108*  BUN 77* 85*  CREATININE 2.50* 2.69*  CALCIUM 9.8 9.8    CMET: Lab Results  Component Value Date   WBC 9.7 06/06/2017   HGB 8.7 (L) 06/06/2017   HCT 27.3 (L) 06/06/2017   PLT 138 (L) 06/06/2017   GLUCOSE 108 (H) 06/06/2017   CHOL 141 02/04/2017   TRIG 78 02/04/2017   HDL 49 02/04/2017   LDLCALC 76 02/04/2017   ALT 31 06/03/2017   AST 33 06/03/2017   NA 140 06/06/2017   K 3.8 06/06/2017   CL 105 06/06/2017   CREATININE 2.69 (H) 06/06/2017   BUN 85 (H) 06/06/2017   CO2 24 06/06/2017   TSH 0.703 05/29/2017   INR 0.83 05/30/2017   HGBA1C 5.3 05/30/2017      PT/INR: No results  for input(s): LABPROT, INR in the last 72 hours. Radiology: Dg Chest Port 1 View  Result Date: 06/06/2017 CLINICAL DATA:  Aortic valve replacement. EXAM: PORTABLE CHEST 1 VIEW COMPARISON:  06/05/2017. FINDINGS: Surgical staples and clips noted over the right chest. Prior median sternotomy. Prior cardiac valve replacement. Stable cardiomegaly. Persistent bibasilar atelectasis/infiltrates and bilateral pleural effusions. No change. No pneumothorax. IMPRESSION: Prior cardiac valve replacement.  Stable cardiomegaly. 2. Persistent bibasilar atelectasis/infiltrates and bilateral pleural effusions again noted without interim change. Electronically Signed   By: Marcello Moores  Register   On: 06/06/2017 07:45   Chronic Kidney Disease   Stage I     GFR >90  Stage II    GFR 60-89  Stage IIIA GFR 45-59  Stage IIIB GFR 30-44  Stage IV   GFR 15-29  Stage V    GFR  <15  Lab Results  Component Value Date   CREATININE 2.69 (H) 06/06/2017   Estimated Creatinine Clearance: 22.1 mL/min (A) (by C-G formula based on SCr of 2.69 mg/dL (H)).   Assessment/Plan: S/P Procedure(s) (LRB): REPLACEMENT OF ASCENDING AORTIC ANEURYSM AND REPAIRED CHRONIC ROOT DISSECTION WITH CIRC ARREST (N/A) TRANSESOPHAGEAL ECHOCARDIOGRAM (TEE) (N/A) AORTIC VALVE REPLACEMENT (AVR) (N/A) Mobilize Diuresis Chronic renal insufficiency creatinine, mildly elevated higher today Continue to monitor renal function Physical therapy is actively involved with mobilizing the patient  Grace Isaac 06/06/2017 9:09 AM

## 2017-06-06 NOTE — Progress Notes (Signed)
      [address]       [place] [address]             [phone]      Up in chair Had atrial tachycardia earlier when lying flat BP (!) 157/79   Pulse 71   Temp 98.4 F (36.9 C) (Oral)   Resp (!) 22   Ht 5\' 6"  (1.676 m)   Wt 206 lb 4.8 oz (93.6 kg)   SpO2 98%   BMI 33.30 kg/m   Intake/Output Summary (Last 24 hours) at 06/06/2017 1807 Last data filed at 06/06/2017 1600 Gross per 24 hour  Intake 580 ml  Output 1600 ml  Net -1020 ml   Continue current care  Zaria Taha C. Roxan Hockey, MD [facility] [phone]

## 2017-06-06 NOTE — Care Management Note (Signed)
Case Management Note  Patient Details  Name: [name] MRN: [id number] Date of Birth: 05/27/1945  Subjective/Objective:                 Spoke with patient at the bedside. She states that she would be open to any [facility] provider. Alvis Lemmings able to take, referral accepted by [facility]. Patient states that she is able to obtain Rollator and 3/1 from family, no additional DME needs noted at this time.    Action/Plan:   Expected Discharge Date:                  Expected Discharge Plan:  [place]  In-House Referral:     Discharge planning Services  CM Consult  Post Acute Care Choice:  Home Health Choice offered to:  Patient  DME Arranged:    DME Agency:     HH Arranged:  RN, PT [place] Agency:  [place]  Status of Service:  In process, will continue to follow  If discussed at Long Length of Stay Meetings, dates discussed:    Additional Comments:  Carles Collet, RN 06/06/2017, 1:49 PM

## 2017-06-06 NOTE — Progress Notes (Signed)
Occupational Therapy Treatment Patient Details Name: [name] MRN: [id number] DOB: 02/17/1946 Today's Date: 06/06/2017    History of present illness Pt adm with SOB and found to have pulmonary edema and pericardial effusion. Pt found to have type A aortic dissection and aortic insufficiency and underwent AVR and repair of aortic ascending aneurysm on 05/30/17. PMH - CKD, chf, HTN, DJD   OT comments  Pt progressing towards OT goals this session. Pt able to progress from mod +2 safety sit <>stand to min guard with rocking technique and good body mechanics (foot placement) prior to attempt. Pt and husband also educated in Sternal precautions during ADL (no handout provided-please provide next session) and require continued education in this area. Pt able to perform BSC transfer post-ambulation in hallway with vc for peri care maintaining sternal precautions. OT will continue to follow acutely prior to Blue remains essential for safety and to maximize independence in ADL/transfers as well fall prevention in the home, caregiver education, and independence in IADL.   Follow Up Recommendations  Home health OT;Supervision/Assistance - 24 hour    Equipment Recommendations  3 in 1 bedside commode(Rollator)    Recommendations for Other Services      Precautions / Restrictions Precautions Precautions: Sternal;Fall Precaution Booklet Issued: No Precaution Comments: reviewed "move in the tube" with her as well as sit <>stand Restrictions Weight Bearing Restrictions: Yes(Sternal) Other Position/Activity Restrictions: Sternal       Mobility Bed Mobility               General bed mobility comments: Pt up in the chair  Transfers Overall transfer level: Needs assistance Equipment used: Rolling walker (2 wheeled);1 person hand held assist Transfers: Sit to/from [employer] Transfers Sit to Stand: Mod assist;+2 safety/equipment;Min guard Stand pivot transfers: Min assist;+2  safety/equipment       General transfer comment: Initially Pt mod A +2 for safety with sit <>stand. Cues for rocking technique and hand placement on knees, Pt able to progress to sit <>stand at min guard level utilizing rocking technique and good foot placement prior to attempt    Balance Overall balance assessment: Needs assistance Sitting-balance support: No upper extremity supported;Feet supported Sitting balance-Leahy Scale: Good     Standing balance support: Bilateral upper extremity supported Standing balance-Leahy Scale: Fair Standing balance comment: Able to stand unsupported for short period of time                           ADL either performed or assessed with clinical judgement   ADL Overall ADL's : Needs assistance/impaired     Grooming: Wash/dry hands;Wash/dry face;Min guard;Cueing for compensatory techniques;Sitting Grooming Details (indicate cue type and reason): educated Pt on "move in the tube" and changes for grooming tasks                 Toilet Transfer: Minimal assistance;+2 for safety/equipment;Stand-pivot;BSC Toilet Transfer Details (indicate cue type and reason): 1 HHA for BSV transfer with vc for safe hand placement Toileting- Clothing Manipulation and Hygiene: Min guard;Cueing for safety;Cueing for sequencing;Cueing for compensatory techniques;Sit to/from stand Toileting - Clothing Manipulation Details (indicate cue type and reason): min A for boost and balance, vc to maintain sternal precautions (Pt attempting to reach around behind instead of through legs)     Functional mobility during ADLs: Minimal assistance;Rolling walker;Cueing for safety       Vision       Perception     Praxis  Cognition Arousal/Alertness: Awake/alert Behavior During Therapy: WFL for tasks assessed/performed Overall Cognitive Status: Within Functional Limits for tasks assessed                                          Exercises      Shoulder Instructions       General Comments Husband present throughout session- RN active participant during session    Pertinent Vitals/ Pain       Pain Assessment: Faces Faces Pain Scale: Hurts a little bit Pain Location: incisional Pain Descriptors / Indicators: Operative site guarding Pain Intervention(s): Monitored during session;Repositioned  Home Living                                          Prior Functioning/Environment              Frequency  Min 2X/week        Progress Toward Goals  OT Goals(current goals can now be found in the care plan section)  Progress towards OT goals: Progressing toward goals  Acute Rehab OT Goals Patient Stated Goal: return home with husband OT Goal Formulation: With patient Time For Goal Achievement: 06/17/17 Potential to Achieve Goals: Good  Plan Discharge plan remains appropriate;Frequency remains appropriate    Co-evaluation                 AM-PAC PT "6 Clicks" Daily Activity     Outcome Measure   Help from another person eating meals?: None Help from another person taking care of personal grooming?: A Little Help from another person toileting, which includes using toliet, bedpan, or urinal?: A Lot Help from another person bathing (including washing, rinsing, drying)?: A Lot Help from another person to put on and taking off regular upper body clothing?: A Lot Help from another person to put on and taking off regular lower body clothing?: A Lot 6 Click Score: 15    End of Session Equipment Utilized During Treatment: Gait belt;Rolling walker  OT Visit Diagnosis: Unsteadiness on feet (R26.81);Muscle weakness (generalized) (M62.81);Pain Pain - part of body: (sternal incision)   Activity Tolerance Patient tolerated treatment well   Patient Left in chair;with call bell/phone within reach;with family/visitor present(O2 reapplied)   Nurse Communication Mobility status        Time:  7858-8502 OT Time Calculation (min): 38 min  Charges: OT General Charges $OT Visit: 1 Visit OT Treatments $Self Care/Home Management : 23-37 mins $Therapeutic Activity: 8-22 mins  Hulda Humphrey OTR/L [place] 06/06/2017, 11:57 AM

## 2017-06-07 ENCOUNTER — Inpatient Hospital Stay (HOSPITAL_COMMUNITY): Payer: Medicare Other

## 2017-06-07 LAB — RENAL FUNCTION PANEL
ALBUMIN: 2.4 g/dL — AB (ref 3.5–5.0)
Anion gap: 11 (ref 5–15)
BUN: 88 mg/dL — AB (ref 6–20)
CHLORIDE: 104 mmol/L (ref 101–111)
CO2: 27 mmol/L (ref 22–32)
Calcium: 10.1 mg/dL (ref 8.9–10.3)
Creatinine, Ser: 2.75 mg/dL — ABNORMAL HIGH (ref 0.44–1.00)
GFR calc Af Amer: 19 mL/min — ABNORMAL LOW (ref >60)
GFR calc non Af Amer: 16 mL/min — ABNORMAL LOW (ref >60)
GLUCOSE: 107 mg/dL — AB (ref 65–99)
PHOSPHORUS: 3.8 mg/dL (ref 2.5–4.6)
POTASSIUM: 3.7 mmol/L (ref 3.5–5.1)
Sodium: 142 mmol/L (ref 135–145)

## 2017-06-07 LAB — CBC
HCT: 28 % — ABNORMAL LOW (ref 36.0–46.0)
Hemoglobin: 8.9 g/dL — ABNORMAL LOW (ref 12.0–15.0)
MCH: 31 pg (ref 26.0–34.0)
MCHC: 31.8 g/dL (ref 30.0–36.0)
MCV: 97.6 fL (ref 78.0–100.0)
Platelets: 233 10*3/uL (ref 150–400)
RBC: 2.87 MIL/uL — ABNORMAL LOW (ref 3.87–5.11)
RDW: 16.1 % — ABNORMAL HIGH (ref 11.5–15.5)
WBC: 12.7 10*3/uL — ABNORMAL HIGH (ref 4.0–10.5)

## 2017-06-07 LAB — GLUCOSE, CAPILLARY
Glucose-Capillary: 106 mg/dL — ABNORMAL HIGH (ref 65–99)
Glucose-Capillary: 94 mg/dL (ref 65–99)
Glucose-Capillary: 115 mg/dL — ABNORMAL HIGH (ref 65–99)

## 2017-06-07 MED ORDER — DARBEPOETIN ALFA 100 MCG/0.5ML IJ SOSY
100.0000 ug | PREFILLED_SYRINGE | INTRAMUSCULAR | Status: DC
  Administered 2017-06-07: 100 ug via SUBCUTANEOUS
  Filled 2017-06-07 (×2): qty 0.5

## 2017-06-07 MED ORDER — AMIODARONE HCL IN DEXTROSE 360-4.14 MG/200ML-% IV SOLN
INTRAVENOUS | Status: AC
  Administered 2017-06-07: 02:00:00
  Filled 2017-06-07: qty 200

## 2017-06-07 MED ORDER — AMIODARONE IV BOLUS ONLY 150 MG/100ML
150.0000 mg | Freq: Once | INTRAVENOUS | Status: AC
  Administered 2017-06-07: 150 mg via INTRAVENOUS

## 2017-06-07 MED ORDER — POTASSIUM CHLORIDE CRYS ER 20 MEQ PO TBCR
40.0000 meq | EXTENDED_RELEASE_TABLET | Freq: Once | ORAL | Status: DC
  Filled 2017-06-07: qty 2

## 2017-06-07 MED ORDER — POTASSIUM CHLORIDE 20 MEQ PO PACK
40.0000 meq | PACK | Freq: Once | ORAL | Status: AC
  Administered 2017-06-07: 40 meq via ORAL
  Filled 2017-06-07: qty 2

## 2017-06-07 MED ORDER — AMIODARONE HCL 200 MG PO TABS
200.0000 mg | ORAL_TABLET | Freq: Two times a day (BID) | ORAL | Status: DC
  Administered 2017-06-07 – 2017-06-10 (×8): 200 mg via ORAL
  Filled 2017-06-07 (×8): qty 1

## 2017-06-07 NOTE — Progress Notes (Signed)
       [address]       [place] [address]             [phone]      Up in chair BP (!) 160/82   Pulse 80   Temp 97.9 F (36.6 C) (Oral)   Resp 19   Ht 5\' 6"  (1.676 m)   Wt 203 lb 14.8 oz (92.5 kg)   SpO2 93%   BMI 32.91 kg/m   Intake/Output Summary (Last 24 hours) at 06/07/2017 1801 Last data filed at 06/07/2017 1700 Gross per 24 hour  Intake 711.99 ml  Output 2000 ml  Net -1288.01 ml   No new issues today  Remo Lipps C. Roxan Hockey, MD [facility] [phone]

## 2017-06-07 NOTE — Progress Notes (Signed)
8 Days Post-Op Procedure(s) (LRB): REPLACEMENT OF ASCENDING AORTIC ANEURYSM AND REPAIRED CHRONIC ROOT DISSECTION WITH CIRC ARREST (N/A) TRANSESOPHAGEAL ECHOCARDIOGRAM (TEE) (N/A) AORTIC VALVE REPLACEMENT (AVR) (N/A) Subjective: No complaints this AM  Objective: Vital signs in last 24 hours: Temp:  [97.6 F (36.4 C)-100.6 F (38.1 C)] 100.6 F (38.1 C) (03/02 0829) Pulse Rate:  [66-128] 80 (03/02 0900) Cardiac Rhythm: Normal sinus rhythm (03/02 0800) Resp:  [16-40] 34 (03/02 0900) BP: (114-166)/(62-144) 146/71 (03/02 0900) SpO2:  [90 %-100 %] 93 % (03/02 0900) Weight:  [203 lb 14.8 oz (92.5 kg)] 203 lb 14.8 oz (92.5 kg) (03/02 0500)  Hemodynamic parameters for last 24 hours:    Intake/Output from previous day: 03/01 0701 - 03/02 0700 In: 772 [P.O.:480; I.V.:292] Out: 1900 [Urine:1900] Intake/Output this shift: Total I/O In: 240 [P.O.:240] Out: -   General appearance: alert, cooperative and no distress Neurologic: intact Heart: regular rate and rhythm Lungs: diminished breath sounds bibasilar Extremities: edema bilaterally Wound: clean and dry  Lab Results: Recent Labs    06/06/17 0400 06/06/17 1750 06/07/17 0702  WBC 9.7  --  12.7*  HGB 8.7* 8.5* 8.9*  HCT 27.3* 25.0* 28.0*  PLT 138*  --  233   BMET:  Recent Labs    06/06/17 0400 06/06/17 1750 06/07/17 0702  NA 140 142 142  K 3.8 3.8 3.7  CL 105 103 104  CO2 24  --  27  GLUCOSE 108* 100* 107*  BUN 85* 83* 88*  CREATININE 2.69* 2.80* 2.75*  CALCIUM 9.8  --  10.1    PT/INR: No results for input(s): LABPROT, INR in the last 72 hours. ABG    Component Value Date/Time   PHART 7.396 06/01/2017 0453   HCO3 24.5 06/01/2017 0453   TCO2 28 06/06/2017 1750   ACIDBASEDEF 2.0 05/31/2017 1255   O2SAT 95.0 06/01/2017 0453   CBG (last 3)  Recent Labs    06/06/17 1230 06/06/17 2156 06/07/17 0834  GLUCAP 89 95 106*    Assessment/Plan: S/P Procedure(s) (LRB): REPLACEMENT OF ASCENDING AORTIC ANEURYSM  AND REPAIRED CHRONIC ROOT DISSECTION WITH CIRC ARREST (N/A) TRANSESOPHAGEAL ECHOCARDIOGRAM (TEE) (N/A) AORTIC VALVE REPLACEMENT (AVR) (N/A) -CV- atrial fib yesterday required 2 additional boluses of amiodarone  Will increase amiodarone to 200 BID for now  RESP- continue IS  RENAL- creatinine stable, stage IV CKD  ENDO- CBG well controlled  Deconditioning- continue to mobilize  Dc central line- unable to draw back on line   LOS: 16 days    Melrose Nakayama 06/07/2017

## 2017-06-07 NOTE — Progress Notes (Signed)
Pt converted back to NSR at 3am, with no additional therapy other than IV amio bolus. Left Gem dual lumen central line unable to draw blood. IV team at bedside and also unable to obtain blood. Veni-puncture called and made aware that labs needed to be drawn on pt. RN will continue to monitor.

## 2017-06-07 NOTE — Progress Notes (Signed)
Pt noted to be in a.fibb 140-150. MD called and new orders received to administer IV amio bolus x1. If HR remains 140's, MD wants cardizem gtt started at 10mg /hr. RN will continue to monitor.

## 2017-06-07 NOTE — Plan of Care (Signed)
  Progressing Health Behavior/Discharge Planning: Ability to manage health-related needs will improve 06/07/2017 1454 - Progressing by Radonna Ricker, RN Clinical Measurements: Will remain free from infection 06/07/2017 1454 - Progressing by Radonna Ricker, RN Diagnostic test results will improve 06/07/2017 1454 - Progressing by Radonna Ricker, RN Respiratory complications will improve 06/07/2017 1454 - Progressing by Radonna Ricker, RN Cardiovascular complication will be avoided 06/07/2017 1454 - Progressing by Radonna Ricker, RN Coping: Level of anxiety will decrease 06/07/2017 1454 - Progressing by Radonna Ricker, RN Education: Ability to demonstrate management of disease process will improve 06/07/2017 1454 - Progressing by Radonna Ricker, RN Ability to verbalize understanding of medication therapies will improve 06/07/2017 1454 - Progressing by Radonna Ricker, RN Cardiac: Ability to achieve and maintain adequate cardiopulmonary perfusion will improve 06/07/2017 1454 - Progressing by Radonna Ricker, RN Education: Ability to demonstrate proper wound care will improve 06/07/2017 1454 - Progressing by Radonna Ricker, RN Knowledge of the prescribed therapeutic regimen will improve 06/07/2017 1454 - Progressing by Radonna Ricker, RN Clinical Measurements: Postoperative complications will be avoided or minimized 06/07/2017 1454 - Progressing by Radonna Ricker, RN Respiratory: Respiratory status will improve 06/07/2017 1454 - Progressing by Radonna Ricker, RN

## 2017-06-07 NOTE — Progress Notes (Signed)
Subjective: Interval History: has no complaint, feeling better, putting out a lot of urine.  Objective: Vital signs in last 24 hours: Temp:  [97.6 F (36.4 C)-98.5 F (36.9 C)] 98.3 F (36.8 C) (03/02 0744) Pulse Rate:  [66-128] 69 (03/02 0700) Resp:  [16-40] 17 (03/02 0700) BP: (114-166)/(62-144) 158/91 (03/02 0700) SpO2:  [90 %-100 %] 97 % (03/02 0700) Weight:  [92.5 kg (203 lb 14.8 oz)] 92.5 kg (203 lb 14.8 oz) (03/02 0500) Weight change: -1.077 kg (-6 oz)  Intake/Output from previous day: 03/01 0701 - 03/02 0700 In: 772 [P.O.:480; I.V.:292] Out: 1900 [Urine:1900] Intake/Output this shift: No intake/output data recorded.  General appearance: alert, cooperative, moderately obese and pale Resp: rales bibasilar and rhonchi bibasilar Cardio: S1, S2 normal and systolic murmur: systolic ejection 3/6, decrescendo at 2nd left intercostal space GI: obese, pos bs, liver down 5 cm Extremities: edema 2-3+  Lab Results: Recent Labs    06/06/17 0400 06/06/17 1750 06/07/17 0702  WBC 9.7  --  12.7*  HGB 8.7* 8.5* 8.9*  HCT 27.3* 25.0* 28.0*  PLT 138*  --  233   BMET:  Recent Labs    06/05/17 0306 06/06/17 0400 06/06/17 1750  NA 140 140 142  K 4.1 3.8 3.8  CL 105 105 103  CO2 25 24  --   GLUCOSE 121* 108* 100*  BUN 77* 85* 83*  CREATININE 2.50* 2.69* 2.80*  CALCIUM 9.8 9.8  --    No results for input(s): PTH in the last 72 hours. Iron Studies: No results for input(s): IRON, TIBC, TRANSFERRIN, FERRITIN in the last 72 hours.  Studies/Results: Dg Chest Port 1 View  Result Date: 06/06/2017 CLINICAL DATA:  Aortic valve replacement. EXAM: PORTABLE CHEST 1 VIEW COMPARISON:  06/05/2017. FINDINGS: Surgical staples and clips noted over the right chest. Prior median sternotomy. Prior cardiac valve replacement. Stable cardiomegaly. Persistent bibasilar atelectasis/infiltrates and bilateral pleural effusions. No change. No pneumothorax. IMPRESSION: Prior cardiac valve replacement.   Stable cardiomegaly. 2. Persistent bibasilar atelectasis/infiltrates and bilateral pleural effusions again noted without interim change. Electronically Signed   By: Marcello Moores  Register   On: 06/06/2017 07:45    I have reviewed the patient's current medications.  Assessment/Plan: 1 CKD 4 diuresing , Cr rising as expected but slowly 2 Anemiia esa/Fe 3 DM controlle 4 Obesity 5 AVR/aortic dissection repair per CVS 6 HPTH vit D P mobilize, esa given, cont Lasix    LOS: 16 days   Jeneen Rinks Cyerra Yim 06/07/2017,7:52 AM

## 2017-06-08 LAB — CBC
HCT: 27.2 % — ABNORMAL LOW (ref 36.0–46.0)
Hemoglobin: 8.6 g/dL — ABNORMAL LOW (ref 12.0–15.0)
MCH: 31 pg (ref 26.0–34.0)
MCHC: 31.6 g/dL (ref 30.0–36.0)
MCV: 98.2 fL (ref 78.0–100.0)
Platelets: 312 10*3/uL (ref 150–400)
RBC: 2.77 MIL/uL — ABNORMAL LOW (ref 3.87–5.11)
RDW: 16.3 % — ABNORMAL HIGH (ref 11.5–15.5)
WBC: 13.3 10*3/uL — ABNORMAL HIGH (ref 4.0–10.5)

## 2017-06-08 LAB — RENAL FUNCTION PANEL
ALBUMIN: 2.6 g/dL — AB (ref 3.5–5.0)
Anion gap: 13 (ref 5–15)
BUN: 90 mg/dL — AB (ref 6–20)
CALCIUM: 10.2 mg/dL (ref 8.9–10.3)
CO2: 25 mmol/L (ref 22–32)
Chloride: 104 mmol/L (ref 101–111)
Creatinine, Ser: 2.73 mg/dL — ABNORMAL HIGH (ref 0.44–1.00)
GFR calc Af Amer: 19 mL/min — ABNORMAL LOW (ref >60)
GFR calc non Af Amer: 16 mL/min — ABNORMAL LOW (ref >60)
GLUCOSE: 104 mg/dL — AB (ref 65–99)
Phosphorus: 3.8 mg/dL (ref 2.5–4.6)
Potassium: 3.5 mmol/L (ref 3.5–5.1)
SODIUM: 142 mmol/L (ref 135–145)

## 2017-06-08 LAB — GLUCOSE, CAPILLARY: GLUCOSE-CAPILLARY: 124 mg/dL — AB (ref 65–99)

## 2017-06-08 MED ORDER — AMLODIPINE BESYLATE 10 MG PO TABS
10.0000 mg | ORAL_TABLET | Freq: Every day | ORAL | Status: DC
  Administered 2017-06-08 – 2017-06-12 (×5): 10 mg via ORAL
  Filled 2017-06-08 (×5): qty 1

## 2017-06-08 MED ORDER — POTASSIUM CHLORIDE 20 MEQ/15ML (10%) PO SOLN
40.0000 meq | Freq: Every day | ORAL | Status: DC
  Administered 2017-06-08: 40 meq via ORAL
  Filled 2017-06-08: qty 30

## 2017-06-08 NOTE — Progress Notes (Signed)
      [address]       [place] [address]             [phone]      Stable day  No new issues  Steed Kanaan C. Katriana Dortch, MD [facility] [phone]  

## 2017-06-08 NOTE — Progress Notes (Signed)
9 Days Post-Op Procedure(s) (LRB): REPLACEMENT OF ASCENDING AORTIC ANEURYSM AND REPAIRED CHRONIC ROOT DISSECTION WITH CIRC ARREST (N/A) TRANSESOPHAGEAL ECHOCARDIOGRAM (TEE) (N/A) AORTIC VALVE REPLACEMENT (AVR) (N/A) Subjective: No complaints this AM  Objective: Vital signs in last 24 hours: Temp:  [97.6 F (36.4 C)-98.2 F (36.8 C)] 98.2 F (36.8 C) (03/03 0831) Pulse Rate:  [72-94] 87 (03/03 0900) Cardiac Rhythm: Normal sinus rhythm (03/02 2000) Resp:  [16-31] 23 (03/03 0900) BP: (125-172)/(71-100) 168/100 (03/03 0900) SpO2:  [89 %-100 %] 93 % (03/03 0900) Weight:  [199 lb 11.8 oz (90.6 kg)] 199 lb 11.8 oz (90.6 kg) (03/03 0645)  Hemodynamic parameters for last 24 hours:    Intake/Output from previous day: 03/02 0701 - 03/03 0700 In: 720 [P.O.:720] Out: 1925 [Urine:1925] Intake/Output this shift: Total I/O In: -  Out: 150 [Urine:150]  General appearance: alert, cooperative and no distress Neurologic: intact Heart: regular rate and rhythm Lungs: diminished breath sounds bibasilar Extremities: edema 2+ Wound: clean and dry  Lab Results: Recent Labs    06/07/17 0702 06/08/17 0410  WBC 12.7* 13.3*  HGB 8.9* 8.6*  HCT 28.0* 27.2*  PLT 233 312   BMET:  Recent Labs    06/07/17 0702 06/08/17 0410  NA 142 142  K 3.7 3.5  CL 104 104  CO2 27 25  GLUCOSE 107* 104*  BUN 88* 90*  CREATININE 2.75* 2.73*  CALCIUM 10.1 10.2    PT/INR: No results for input(s): LABPROT, INR in the last 72 hours. ABG    Component Value Date/Time   PHART 7.396 06/01/2017 0453   HCO3 24.5 06/01/2017 0453   TCO2 28 06/06/2017 1750   ACIDBASEDEF 2.0 05/31/2017 1255   O2SAT 95.0 06/01/2017 0453   CBG (last 3)  Recent Labs    06/07/17 1708 06/07/17 2148 06/08/17 0829  GLUCAP 94 115* 124*    Assessment/Plan: S/P Procedure(s) (LRB): REPLACEMENT OF ASCENDING AORTIC ANEURYSM AND REPAIRED CHRONIC ROOT DISSECTION WITH CIRC ARREST (N/A) TRANSESOPHAGEAL ECHOCARDIOGRAM (TEE)  (N/A) AORTIC VALVE REPLACEMENT (AVR) (N/A) -  CV- in SR on amiodarone  Bp elevated- restart amlodipine  RESP- continue IS  RENAL- creatinine stable near baseline  Diuresing with current regimen  ENDO- CBG well controlled, no hx of DM- dc CBG/SSI  Deconditioning- ambulated around unit this AM   LOS: 17 days    Melrose Nakayama 06/08/2017

## 2017-06-08 NOTE — Progress Notes (Signed)
Subjective: Interval History: has no complaint,eating better, strength getting better..  Objective: Vital signs in last 24 hours: Temp:  [97.6 F (36.4 C)-100.6 F (38.1 C)] 97.6 F (36.4 C) (03/03 0458) Pulse Rate:  [72-94] 85 (03/03 0800) Resp:  [16-34] 22 (03/03 0800) BP: (125-172)/(71-93) 154/81 (03/03 0800) SpO2:  [89 %-100 %] 94 % (03/03 0800) Weight:  [90.6 kg (199 lb 11.8 oz)] 90.6 kg (199 lb 11.8 oz) (03/03 0645) Weight change: -1.9 kg (-3 oz)  Intake/Output from previous day: 03/02 0701 - 03/03 0700 In: 720 [P.O.:720] Out: 1925 [Urine:1925] Intake/Output this shift: No intake/output data recorded.  General appearance: alert, cooperative, no distress, moderately obese and pale Resp: rales bibasilar and dullness to percussion in bases Cardio: S1, S2 normal and systolic murmur: systolic ejection 2/6, decrescendo at 2nd left intercostal space GI: obese, pos bs, soft Extremities: edema 3-4 +  Lab Results: Recent Labs    06/07/17 0702 06/08/17 0410  WBC 12.7* 13.3*  HGB 8.9* 8.6*  HCT 28.0* 27.2*  PLT 233 312   BMET:  Recent Labs    06/07/17 0702 06/08/17 0410  NA 142 142  K 3.7 3.5  CL 104 104  CO2 27 25  GLUCOSE 107* 104*  BUN 88* 90*  CREATININE 2.75* 2.73*  CALCIUM 10.1 10.2   No results for input(s): PTH in the last 72 hours. Iron Studies: No results for input(s): IRON, TIBC, TRANSFERRIN, FERRITIN in the last 72 hours.  Studies/Results: Dg Chest Port 1 View  Result Date: 06/07/2017 CLINICAL DATA:  Followup exam.  Chest tube.  Recent cardiac surgery. EXAM: PORTABLE CHEST 1 VIEW COMPARISON:  06/06/2017 and older exams. FINDINGS: Changes from cardiac surgery and valve replacement are stable from the previous day's study. There is prominence of the aortic arch. No mediastinal widening. There is persistent opacity at the lung bases obscuring hemidiaphragms consistent with a combination of pleural effusions and atelectasis. No pulmonary edema. No  pneumothorax. Left subclavian central venous line is stable, tip projecting at the confluence of the left brachiocephalic vein and superior vena cava. IMPRESSION: 1. No significant change from the previous day's study. 2. Persistent lung base opacities consistent with combination of moderate effusions with atelectasis. 3. No pulmonary edema.  No pneumothorax or mediastinal widening. Electronically Signed   By: Lajean Manes M.D.   On: 06/07/2017 07:55    I have reviewed the patient's current medications.  Assessment/Plan: 1 CKD 4 stable. Diuresing at desireable rate.  Will add daily k. 2 Anemia esa/Fe 3 HPTH vit D 4 AVR/aortic dissection repair 5 DM controlled 6 Obesity P cont diuretics, daily K, Plans per [facility],  Will s/o at this time    LOS: 17 days   Jeneen Rinks Stellarose Cerny 06/08/2017,8:28 AM

## 2017-06-09 ENCOUNTER — Inpatient Hospital Stay (HOSPITAL_COMMUNITY): Payer: Medicare Other

## 2017-06-09 LAB — CBC
HCT: 28.2 % — ABNORMAL LOW (ref 36.0–46.0)
Hemoglobin: 8.9 g/dL — ABNORMAL LOW (ref 12.0–15.0)
MCH: 31.1 pg (ref 26.0–34.0)
MCHC: 31.6 g/dL (ref 30.0–36.0)
MCV: 98.6 fL (ref 78.0–100.0)
Platelets: 318 10*3/uL (ref 150–400)
RBC: 2.86 MIL/uL — ABNORMAL LOW (ref 3.87–5.11)
RDW: 16.6 % — ABNORMAL HIGH (ref 11.5–15.5)
WBC: 12.5 10*3/uL — ABNORMAL HIGH (ref 4.0–10.5)

## 2017-06-09 LAB — RENAL FUNCTION PANEL
Albumin: 2.6 g/dL — ABNORMAL LOW (ref 3.5–5.0)
Anion gap: 11 (ref 5–15)
BUN: 92 mg/dL — ABNORMAL HIGH (ref 6–20)
CHLORIDE: 102 mmol/L (ref 101–111)
CO2: 28 mmol/L (ref 22–32)
Calcium: 10.3 mg/dL (ref 8.9–10.3)
Creatinine, Ser: 2.75 mg/dL — ABNORMAL HIGH (ref 0.44–1.00)
GFR, EST AFRICAN AMERICAN: 19 mL/min — AB (ref >60)
GFR, EST NON AFRICAN AMERICAN: 16 mL/min — AB (ref >60)
Glucose, Bld: 116 mg/dL — ABNORMAL HIGH (ref 65–99)
PHOSPHORUS: 2.8 mg/dL (ref 2.5–4.6)
POTASSIUM: 3.5 mmol/L (ref 3.5–5.1)
Sodium: 141 mmol/L (ref 135–145)

## 2017-06-09 LAB — GLUCOSE, CAPILLARY
Glucose-Capillary: 131 mg/dL — ABNORMAL HIGH (ref 65–99)
Glucose-Capillary: 125 mg/dL — ABNORMAL HIGH (ref 65–99)

## 2017-06-09 MED ORDER — POTASSIUM CHLORIDE 20 MEQ/15ML (10%) PO SOLN
40.0000 meq | Freq: Two times a day (BID) | ORAL | Status: DC
  Administered 2017-06-09 – 2017-06-10 (×4): 40 meq via ORAL
  Filled 2017-06-09 (×4): qty 30

## 2017-06-09 MED ORDER — MOVING RIGHT ALONG BOOK
Freq: Once | Status: AC
  Administered 2017-06-09: 08:00:00
  Filled 2017-06-09: qty 1

## 2017-06-09 MED ORDER — SODIUM CHLORIDE 0.9% FLUSH: 3.0000 mL | INTRAVENOUS | Status: DC | PRN

## 2017-06-09 MED ORDER — INSULIN ASPART 100 UNIT/ML ~~LOC~~ SOLN
0.0000 [IU] | Freq: Three times a day (TID) | SUBCUTANEOUS | Status: DC
  Administered 2017-06-09: 2 [IU] via SUBCUTANEOUS

## 2017-06-09 MED ORDER — SODIUM CHLORIDE 0.9 % IV SOLN: 250.0000 mL | INTRAVENOUS | Status: DC | PRN

## 2017-06-09 MED ORDER — ENSURE ENLIVE PO LIQD
237.0000 mL | Freq: Two times a day (BID) | ORAL | Status: DC
  Administered 2017-06-09 – 2017-06-16 (×14): 237 mL via ORAL

## 2017-06-09 MED ORDER — SODIUM CHLORIDE 0.9% FLUSH
3.0000 mL | Freq: Two times a day (BID) | INTRAVENOUS | Status: DC
  Administered 2017-06-09 – 2017-06-16 (×12): 3 mL via INTRAVENOUS

## 2017-06-09 MED ORDER — MAGNESIUM HYDROXIDE 400 MG/5ML PO SUSP: 30.0000 mL | Freq: Every day | ORAL | Status: DC | PRN

## 2017-06-09 NOTE — Progress Notes (Signed)
10 Days Post-Op Procedure(s) (LRB): REPLACEMENT OF ASCENDING AORTIC ANEURYSM AND REPAIRED CHRONIC ROOT DISSECTION WITH CIRC ARREST (N/A) TRANSESOPHAGEAL ECHOCARDIOGRAM (TEE) (N/A) AORTIC VALVE REPLACEMENT (AVR) (N/A) Subjective: nsr roomm air Will remove EPWs. tx to [place] with [facility] HPT later this week  Objective: Vital signs in last 24 hours: Temp:  [97.1 F (36.2 C)-98.6 F (37 C)] 98.2 F (36.8 C) (03/04 0736) Pulse Rate:  [84-108] 86 (03/04 0700) Cardiac Rhythm: Normal sinus rhythm (03/04 0730) Resp:  [16-31] 22 (03/04 0700) BP: (112-154)/(59-84) 112/77 (03/04 0700) SpO2:  [89 %-98 %] 98 % (03/04 0700) Weight:  [197 lb 5 oz (89.5 kg)] 197 lb 5 oz (89.5 kg) (03/04 0600)  Hemodynamic parameters for last 24 hours:  stable  Intake/Output from previous day: 03/03 0701 - 03/04 0700 In: 620 [P.O.:620] Out: 2000 [Urine:2000] Intake/Output this shift: Total I/O In: -  Out: 150 [Urine:150]       Exam    General- alert and comfortable    Neck- no JVD, no cervical adenopathy palpable, no carotid bruit   Lungs- clear without rales, wheezes   Cor- regular rate and rhythm, no murmur , gallop   Abdomen- soft, non-tender   Extremities - warm, non-tender, minimal edema   Neuro- oriented, appropriate, no focal weakness   Lab Results: Recent Labs    06/08/17 0410 06/09/17 0324  WBC 13.3* 12.5*  HGB 8.6* 8.9*  HCT 27.2* 28.2*  PLT 312 318   BMET:  Recent Labs    06/08/17 0410 06/09/17 0324  NA 142 141  K 3.5 3.5  CL 104 102  CO2 25 28  GLUCOSE 104* 116*  BUN 90* 92*  CREATININE 2.73* 2.75*  CALCIUM 10.2 10.3    PT/INR: No results for input(s): LABPROT, INR in the last 72 hours. ABG    Component Value Date/Time   PHART 7.396 06/01/2017 0453   HCO3 24.5 06/01/2017 0453   TCO2 28 06/06/2017 1750   ACIDBASEDEF 2.0 05/31/2017 1255   O2SAT 95.0 06/01/2017 0453   CBG (last 3)  Recent Labs    06/07/17 2148 06/08/17 0829 06/09/17 0739  GLUCAP 115* 124*  125*    Assessment/Plan: S/P Procedure(s) (LRB): REPLACEMENT OF ASCENDING AORTIC ANEURYSM AND REPAIRED CHRONIC ROOT DISSECTION WITH CIRC ARREST (N/A) TRANSESOPHAGEAL ECHOCARDIOGRAM (TEE) (N/A) AORTIC VALVE REPLACEMENT (AVR) (N/A) Mobilize Diuresis d/c pacing wires Plan for transfer to step-down: see transfer orders   LOS: 18 days    [name] 06/09/2017

## 2017-06-09 NOTE — Progress Notes (Signed)
Occupational Therapy Treatment Patient Details Name: [name] MRN: [id number] DOB: Mar 08, 1946 Today's Date: 06/09/2017    History of present illness Pt adm with SOB and found to have pulmonary edema and pericardial effusion. Pt found to have type A aortic dissection and aortic insufficiency and underwent AVR and repair of aortic ascending aneurysm on 05/30/17. PMH - CKD, chf, HTN, DJD   OT comments  Pt continues to make steady progress with ADL and mobility @ RW level. VSS throughout session. SOB with activity, but improved as compared to earlier sessions. Dyspnea 3/4. Will continue to follow acutely. Recommend follow up [place].   Follow Up Recommendations  Home health OT;Supervision/Assistance - 24 hour    Equipment Recommendations  3 in 1 bedside commode    Recommendations for Other Services      Precautions / Restrictions Precautions Precautions: Sternal;Fall       Mobility Bed Mobility               General bed mobility comments: OOB in chair  Transfers Overall transfer level: Needs assistance Equipment used: Rolling walker (2 wheeled) Transfers: Sit to/from Stand Sit to Stand: Min assist Stand pivot transfers: Min guard            Balance     Sitting balance-Leahy Scale: Good       Standing balance-Leahy Scale: Fair                             ADL either performed or assessed with clinical judgement   ADL Overall ADL's : Needs assistance/impaired     Grooming: Oral care;Standing;Cueing for safety       Lower Body Bathing: Minimal assistance;Sit to/from stand;Cueing for safety Lower Body Bathing Details (indicate cue type and reason): vc to maintain sternal precautions when bathing         Toilet Transfer: Minimal assistance;RW;Ambulation   Toileting- Clothing Manipulation and Hygiene: Min guard;Cueing for safety       Functional mobility during ADLs: Minimal assistance;Rolling walker;Cueing for safety General ADL Comments:  better job scooting to edge of chair before standing, but continues to attempt to push up from chair or pull up on RW     Vision       Perception     Praxis      Cognition Arousal/Alertness: Awake/alert Behavior During Therapy: [facility] for tasks assessed/performed Overall Cognitive Status: Within Functional Limits for tasks assessed    ? STM deficits - continue to require vc to follow sternal precautions                                      Exercises     Shoulder Instructions       General Comments      Pertinent Vitals/ Pain       Pain Assessment: Faces Faces Pain Scale: Hurts a little bit Pain Location: general discomfort Pain Descriptors / Indicators: Discomfort Pain Intervention(s): Limited activity within patient's tolerance  Home Living                                          Prior Functioning/Environment              Frequency  Min 2X/week  Progress Toward Goals  OT Goals(current goals can now be found in the care plan section)  Progress towards OT goals: Progressing toward goals  Acute Rehab OT Goals Patient Stated Goal: return home with husband OT Goal Formulation: With patient Time For Goal Achievement: 06/17/17 Potential to Achieve Goals: Good ADL Goals Pt Will Perform Lower Body Bathing: with set-up;with supervision;with caregiver independent in assisting;sit to/from stand Pt Will Perform Lower Body Dressing: with supervision;with set-up;with caregiver independent in assisting;sit to/from stand Pt Will Transfer to Toilet: with modified independence;ambulating;bedside commode Pt Will Perform Toileting - Clothing Manipulation and hygiene: with modified independence;sitting/lateral leans;sit to/from stand Additional ADL Goal #1: Pt will independently demonstrate sternal precautions during ADL  Plan Discharge plan remains appropriate;Frequency remains appropriate    Co-evaluation                  AM-PAC PT "6 Clicks" Daily Activity     Outcome Measure   Help from another person eating meals?: None Help from another person taking care of personal grooming?: A Little Help from another person toileting, which includes using toliet, bedpan, or urinal?: A Little Help from another person bathing (including washing, rinsing, drying)?: A Little Help from another person to put on and taking off regular upper body clothing?: A Lot Help from another person to put on and taking off regular lower body clothing?: A Lot 6 Click Score: 17    End of Session Equipment Utilized During Treatment: Gait belt;Rolling walker  OT Visit Diagnosis: Unsteadiness on feet (R26.81);Muscle weakness (generalized) (M62.81);Pain Pain - part of body: (general discomfort )   Activity Tolerance Patient tolerated treatment well   Patient Left in chair;with call bell/phone within reach;with family/visitor present   Nurse Communication Mobility status        Time: 7616-0737 OT Time Calculation (min): 25 min  Charges: OT General Charges $OT Visit: 1 Visit OT Treatments $Self Care/Home Management : 23-37 mins  [facility], OT/L  [phone] 06/09/2017   Deshannon Hinchliffe,HILLARY 06/09/2017, 1:06 PM

## 2017-06-09 NOTE — Progress Notes (Signed)
Pacing Wires removed as per protocol. Patient tolerated well. No adverse events noted.

## 2017-06-09 NOTE — Progress Notes (Signed)
Nutrition Follow-up  DOCUMENTATION CODES:   Obesity unspecified  INTERVENTION:   Ensure Enlive po BID, each supplement provides 350 kcal and 20 grams of protein   NUTRITION DIAGNOSIS:   Inadequate oral intake related to inability to eat as evidenced by NPO status.  Being addressed via supplements  GOAL:   Patient will meet greater than or equal to 90% of their needs  Progressing  MONITOR:   PO intake, Supplement acceptance, Diet advancement, Labs, Weight trends  REASON FOR ASSESSMENT:   Ventilator    ASSESSMENT:   72 y.o. female with hx of CKD, CHF, HTN, anemia, pericardial effusion who presents to the emergency room with shortness of breath.  Patient has been eating and drinking well. Progressively over the last week patient has become increasingly short of breath. Developed a dry hacking cough. Patient got short of breath to the point that she could not lay flat.    2/22 Replacement of ascending aortic aneurysm and repaired chronic root dissection with circ arrest, TEE, AVR  2/23 Extubated  Working with PT  Diet advanced from [facility] to [place] on 3/2. Recorded po intake <50% of meals. Pt reports appetite is up and down. Pt agreeable to trying Ensure supplement to supplement po intake  Weight continues to trend down post surgery but still above weight on admission. Pt net positive 2 L per I/O flow sheet  Labs: Creatinine 2.75, BUN 92 Meds: lasix, KCl  Diet Order:  Diet Heart Room service appropriate? Yes; Fluid consistency: Thin  EDUCATION NEEDS:   No education needs have been identified at this time  Skin:  Skin Assessment: Skin Integrity Issues: Skin Integrity Issues:: Incisions Incisions: R groin and chest incisions 2/22  Last BM:  3/4  Height:   Ht Readings from Last 1 Encounters:  05/28/17 5\' 6"  (1.676 m)    Weight:   Wt Readings from Last 1 Encounters:  06/09/17 197 lb 5 oz (89.5 kg)    Ideal Body Weight:  59.09 kg  BMI:  Body mass  index is 31.85 kg/m.  Estimated Nutritional Needs:   Kcal:  1600-1800 kcals   Protein:  87-100 g  Fluid:  >/= 1.6 L   Kerman Passey MS, RD, LDN, CNSC [phone] Pager  [phone] Weekend/On-Call Pager

## 2017-06-09 NOTE — Progress Notes (Signed)
Physical Therapy Treatment Patient Details Name: [name] MRN: [id number] DOB: Oct 19, 1945 Today's Date: 06/09/2017    History of Present Illness Pt adm with SOB and found to have pulmonary edema and pericardial effusion. Pt found to have type A aortic dissection and aortic insufficiency and underwent AVR and repair of aortic ascending aneurysm on 05/30/17. PMH - CKD, chf, HTN, DJD    PT Comments    Pt reports not feeling well on arrival but agreeable to work on transfer training to improve ease.  Pt is now able to achieve standing with min assistance but continues to require cueing for safety.  Pt once in standing did perform brief trial in her room.  Pt continues to progress with each visit and HHPT remains appropriate.    Follow Up Recommendations  Home health PT;Supervision/Assistance - 24 hour     Equipment Recommendations  Other (comment)(4 wheeled RW with a seat)    Recommendations for Other Services       Precautions / Restrictions Precautions Precautions: Sternal;Fall Precaution Booklet Issued: No Precaution Comments: reviewed "move in the tube" with her as well as sit <>stand Restrictions Weight Bearing Restrictions: Yes(sternal precautions) Other Position/Activity Restrictions: Sternal    Mobility  Bed Mobility               General bed mobility comments: OOB in chair, required cues to scoot to edge of chair but able to perform unassisted.    Transfers Overall transfer level: Needs assistance Equipment used: Rolling walker (2 wheeled) Transfers: Sit to/from Stand Sit to Stand: Min assist Stand pivot transfers: Min guard       General transfer comment: Cues for hand placement on knees to push,  Pt prefers to go on the count of 4 but at 4 she begins to reach for RW.  Pt attempted x2 without assistance and she remains unable.  Pt still requires min +1 for assistance to achieve standing.  Pt required cues to rock more actively to achieve standing with  assistance of momentum.   Ambulation/Gait Ambulation/Gait assistance: Min guard Ambulation Distance (Feet): 20 Feet Assistive device: Rolling walker (2 wheeled) Gait Pattern/deviations: Step-through pattern;Decreased step length - right;Decreased step length - left;Shuffle;Trunk flexed Gait velocity: decr Gait velocity interpretation: Below normal speed for age/gender General Gait Details: Pt refused further gait distance as she reports she is not feeling well. Pt on RA and ranged from 87%-93%, destauration noted when patient is forward flexed.  Appears to obstruct her breathing in this posture.     Stairs            Wheelchair Mobility    Modified Rankin (Stroke Patients Only)       Balance Overall balance assessment: Needs assistance   Sitting balance-Leahy Scale: Good       Standing balance-Leahy Scale: Fair                              Cognition Arousal/Alertness: Awake/alert Behavior During Therapy: WFL for tasks assessed/performed Overall Cognitive Status: Within Functional Limits for tasks assessed                                        Exercises      General Comments        Pertinent Vitals/Pain Pain Assessment: 0-10 Faces Pain Scale: Hurts a little bit Pain Location: general discomfort  Pain Descriptors / Indicators: Discomfort Pain Intervention(s): Monitored during session;Repositioned    Home Living                      Prior Function            PT Goals (current goals can now be found in the care plan section) Acute Rehab PT Goals Patient Stated Goal: return home with husband Potential to Achieve Goals: Good Progress towards PT goals: Progressing toward goals    Frequency    Min 3X/week      PT Plan Current plan remains appropriate    Co-evaluation              AM-PAC PT "6 Clicks" Daily Activity  Outcome Measure  Difficulty turning over in bed (including adjusting bedclothes,  sheets and blankets)?: Unable Difficulty moving from lying on back to sitting on the side of the bed? : Unable Difficulty sitting down on and standing up from a chair with arms (e.g., wheelchair, bedside commode, etc,.)?: Unable Help needed moving to and from a bed to chair (including a wheelchair)?: A Little Help needed walking in hospital room?: A Little Help needed climbing 3-5 steps with a railing? : A Little 6 Click Score: 12    End of Session Equipment Utilized During Treatment: Gait belt Activity Tolerance: Patient limited by fatigue Patient left: in chair;with call bell/phone within reach;with nursing/sitter in room Nurse Communication: Mobility status PT Visit Diagnosis: Unsteadiness on feet (R26.81);Other abnormalities of gait and mobility (R26.89);Muscle weakness (generalized) (M62.81)     Time: 8250-0370 PT Time Calculation (min) (ACUTE ONLY): 24 min  Charges:  $Therapeutic Activity: 23-37 mins                    G CodesGovernor Rooks, PTA pager [phone]    Cristela Blue 06/09/2017, 2:02 PM

## 2017-06-09 NOTE — Consult Note (Addendum)
   [facility] CM Inpatient Consult   06/09/2017  [name] 1946-01-10 [id number]   [facility] Care Management follow up.   [name] remains agreeable to [place] Management services post discharge. [facility] Care Management written consent was obtained earlier on in her admission. At that time, [place] follow up was discussed.   However, due to extended length of stay, it is suggested that Mrs. Sons have [facility] RNCM follow up for potential home visits.   Please refer to hospital liaison initial [place] Management consult note on 05/26/17 for further details.  [name] is agreeable to [place] referral.   Reiterated [facility] Care Management will not interfere or replace services provided by home health.   Made (covering) inpatient RNCM Milderd Meager) aware [facility] Care Management to follow post discharge.    Marthenia Rolling, MSN-Ed, RN,BSN [facility] Liaison [phone]

## 2017-06-10 ENCOUNTER — Inpatient Hospital Stay (HOSPITAL_COMMUNITY): Payer: Medicare Other

## 2017-06-10 LAB — RENAL FUNCTION PANEL
ANION GAP: 10 (ref 5–15)
Albumin: 2.7 g/dL — ABNORMAL LOW (ref 3.5–5.0)
BUN: 86 mg/dL — ABNORMAL HIGH (ref 6–20)
CALCIUM: 10.8 mg/dL — AB (ref 8.9–10.3)
CHLORIDE: 102 mmol/L (ref 101–111)
CO2: 31 mmol/L (ref 22–32)
Creatinine, Ser: 2.71 mg/dL — ABNORMAL HIGH (ref 0.44–1.00)
GFR calc non Af Amer: 17 mL/min — ABNORMAL LOW (ref >60)
GFR, EST AFRICAN AMERICAN: 19 mL/min — AB (ref >60)
Glucose, Bld: 120 mg/dL — ABNORMAL HIGH (ref 65–99)
Phosphorus: 2.3 mg/dL — ABNORMAL LOW (ref 2.5–4.6)
Potassium: 4.1 mmol/L (ref 3.5–5.1)
Sodium: 143 mmol/L (ref 135–145)

## 2017-06-10 LAB — CBC
HCT: 30.1 % — ABNORMAL LOW (ref 36.0–46.0)
Hemoglobin: 9.4 g/dL — ABNORMAL LOW (ref 12.0–15.0)
MCH: 30.9 pg (ref 26.0–34.0)
MCHC: 31.2 g/dL (ref 30.0–36.0)
MCV: 99 fL (ref 78.0–100.0)
Platelets: 361 10*3/uL (ref 150–400)
RBC: 3.04 MIL/uL — ABNORMAL LOW (ref 3.87–5.11)
RDW: 16.9 % — ABNORMAL HIGH (ref 11.5–15.5)
WBC: 13.4 10*3/uL — ABNORMAL HIGH (ref 4.0–10.5)

## 2017-06-10 MED ORDER — CARVEDILOL 3.125 MG PO TABS
3.1250 mg | ORAL_TABLET | Freq: Two times a day (BID) | ORAL | Status: DC
  Administered 2017-06-10 – 2017-06-16 (×13): 3.125 mg via ORAL
  Filled 2017-06-10 (×13): qty 1

## 2017-06-10 NOTE — Discharge Instructions (Signed)
Aortic Valve Replacement, Care After °Refer to this sheet in the next few weeks. These instructions provide you with information about caring for yourself after your procedure. Your health care provider may also give you more specific instructions. Your treatment has been planned according to current medical practices, but problems sometimes occur. Call your health care provider if you have any problems or questions after your procedure. °What can I expect after the procedure? °After the procedure, it is common to have: °· Pain around your incision area. °· A small amount of blood or clear fluid coming from your incision. ° °Follow these instructions at home: °Eating and drinking ° °· Follow instructions from your health care provider about eating or drinking restrictions. °? Limit alcohol intake to no more than 1 drink per day for nonpregnant women and 2 drinks per day for men. One drink equals 12 oz of beer, 5 oz of wine, or 1½ oz of hard liquor. °? Limit how much caffeine you drink. Caffeine can affect your heart's rate and rhythm. °· Drink enough fluid to keep your urine clear or pale yellow. °· Eat a heart-healthy diet. This should include plenty of fresh fruits and vegetables. If you eat meat, it should be lean cuts. Avoid foods that are: °? High in salt, saturated fat, or sugar. °? Canned or highly processed. °? Fried. °Activity °· Return to your normal activities as told by your health care provider. Ask your health care provider what activities are safe for you. °· Exercise regularly once you have recovered, as told by your health care provider. °· Avoid sitting for more than 2 hours at a time without moving. Get up and move around at least once every 1-2 hours. This helps to prevent blood clots in the legs. °· Do not lift anything that is heavier than 10 lb (4.5 kg) until your health care provider approves. °· Avoid pushing or pulling things with your arms until your health care provider approves. This  includes pulling on handrails to help you climb stairs. °Incision care ° °· Follow instructions from your health care provider about how to take care of your incision. Make sure you: °? Wash your hands with soap and water before you change your bandage (dressing). If soap and water are not available, use hand sanitizer. °? Change your dressing as told by your health care provider. °? Leave stitches (sutures), skin glue, or adhesive strips in place. These skin closures may need to stay in place for 2 weeks or longer. If adhesive strip edges start to loosen and curl up, you may trim the loose edges. Do not remove adhesive strips completely unless your health care provider tells you to do that. °· Check your incision area every day for signs of infection. Check for: °? More redness, swelling, or pain. °? More fluid or blood. °? Warmth. °? Pus or a bad smell. °Medicines °· Take over-the-counter and prescription medicines only as told by your health care provider. °· If you were prescribed an antibiotic medicine, take it as told by your health care provider. Do not stop taking the antibiotic even if you start to feel better. °Travel °· Avoid airplane travel for as long as told by your health care provider. °· When you travel, bring a list of your medicines and a record of your medical history with you. Carry your medicines with you. °Driving °· Ask your health care provider when it is safe for you to drive. Do not drive until your health   care provider approves. °· Do not drive or operate heavy machinery while taking prescription pain medicine. °Lifestyle ° °· Do not use any tobacco products, such as cigarettes, chewing tobacco, or e-cigarettes. If you need help quitting, ask your health care provider. °· Resume sexual activity as told by your health care provider. Do not use medicines for erectile dysfunction unless your health care provider approves, if this applies. °· Work with your health care provider to keep your  blood pressure and cholesterol under control, and to manage any other heart conditions that you have. °· Maintain a healthy weight. °General instructions °· Do not take baths, swim, or use a hot tub until your health care provider approves. °· Do not strain to have a bowel movement. °· Avoid crossing your legs while sitting down. °· Check your temperature every day for a fever. A fever may be a sign of infection. °· If you are a woman and you plan to become pregnant, talk with your health care provider before you become pregnant. °· Wear compression stockings if your health care provider instructs you to do this. These stockings help to prevent blood clots and reduce swelling in your legs. °· Tell all health care providers who care for you that you have an artificial (prosthetic) aortic valve. If you have or have had heart disease or endocarditis, tell all health care providers about these conditions as well. °· Keep all follow-up visits as told by your health care provider. This is important. °Contact a health care provider if: °· You develop a skin rash. °· You experience sudden, unexplained changes in your weight. °· You have more redness, swelling, or pain around your incision. °· You have more fluid or blood coming from your incision. °· Your incision feels warm to the touch. °· You have pus or a bad smell coming from your incision. °· You have a fever. °Get help right away if: °· You develop chest pain that is different from the pain coming from your incision. °· You develop shortness of breath or difficulty breathing. °· You start to feel light-headed. °These symptoms may represent a serious problem that is an emergency. Do not wait to see if the symptoms will go away. Get medical help right away. Call your local emergency services (911 in the [place] Do not drive yourself to the hospital. °This information is not intended to replace advice given to you by your health care provider. Make sure you discuss any  questions you have with your health care provider. °Document Released: 10/11/2004 Document Revised: 08/31/2015 Document Reviewed: 02/26/2015 °[employer] Interactive Patient Education © 2017 [employer] ° °

## 2017-06-10 NOTE — Progress Notes (Signed)
Physical Therapy Treatment Patient Details Name: [name] MRN: [id number] DOB: 28-Apr-1945 Today's Date: 06/10/2017    History of Present Illness Pt adm with SOB and found to have pulmonary edema and pericardial effusion. Pt found to have type A aortic dissection and aortic insufficiency and underwent AVR and repair of aortic ascending aneurysm on 05/30/17. PMH - CKD, chf, HTN, DJD    PT Comments    Pt performed gait training with emphasis on pacing and pursed lip breathing.  Pt required rest break after 60ft of gt trial.  Pt desaturated to 83% and required 2L to improve and maintain above 90% during duration of tx.  Pt able to recall sternal precautions but continues to reach for device to pull into standing when attempting to stand from lower seated surfaces.   SATURATION QUALIFICATIONS: (This note is used to comply with regulatory documentation for home oxygen)  Patient Saturations on Room Air at Rest = 94%  Patient Saturations on Room Air while Ambulating = 83%  Patient Saturations on 2 Liters of oxygen while Ambulating = 90%  Please briefly explain why patient needs home oxygen: desaturation with activity.   Follow Up Recommendations  Home health PT;Supervision/Assistance - 24 hour     Equipment Recommendations  Other (comment)(4 wheeled RW with a seat, she will need a wide width RW due to her size.  )    Recommendations for Other Services       Precautions / Restrictions Precautions Precautions: Sternal;Fall Precaution Booklet Issued: No Precaution Comments: reviewed "move in the tube" with her as well as sit <>stand Restrictions Weight Bearing Restrictions: Yes Other Position/Activity Restrictions: Sternal    Mobility  Bed Mobility               General bed mobility comments: OOB in chair, required cues to scoot to edge of chair but able to perform unassisted.    Transfers Overall transfer level: Needs assistance Equipment used: Rolling walker (2  wheeled) Transfers: Sit to/from Stand Sit to Stand: Min assist         General transfer comment: Cues for hand placement on knees to push,  Pt prefers to go on the count of 4 but at 4 she begins to reach for RW.  Pt attempted x2 without assistance and she remains unable.  Pt still requires min +1 for assistance to achieve standing.  Pt required cues to rock more actively to achieve standing with assistance of momentum.   Pt performed from higher seated rollator and she only required min guard assistance.    Ambulation/Gait Ambulation/Gait assistance: Min guard Ambulation Distance (Feet): 80 Feet(x2 trials.  ) Assistive device: Rolling walker (2 wheeled) Gait Pattern/deviations: Step-through pattern;Decreased step length - right;Decreased step length - left;Shuffle;Trunk flexed Gait velocity: decr Gait velocity interpretation: Below normal speed for age/gender General Gait Details: Pt in better spirits and feeling better SPO2 decreased to 83% on RA with activity and patient required 2L to increased and maintain 90% during activity.     Stairs            Wheelchair Mobility    Modified Rankin (Stroke Patients Only)       Balance Overall balance assessment: Needs assistance   Sitting balance-Leahy Scale: Good       Standing balance-Leahy Scale: Fair Standing balance comment: Able to stand unsupported for short period of time  Cognition Arousal/Alertness: Awake/alert Behavior During Therapy: WFL for tasks assessed/performed Overall Cognitive Status: Within Functional Limits for tasks assessed                                        Exercises      General Comments        Pertinent Vitals/Pain Pain Assessment: No/denies pain    Home Living                      Prior Function            PT Goals (current goals can now be found in the care plan section) Acute Rehab PT Goals Patient Stated Goal:  return home with husband Potential to Achieve Goals: Good Progress towards PT goals: Progressing toward goals    Frequency    Min 3X/week      PT Plan Current plan remains appropriate    Co-evaluation              AM-PAC PT "6 Clicks" Daily Activity  Outcome Measure  Difficulty turning over in bed (including adjusting bedclothes, sheets and blankets)?: Unable Difficulty moving from lying on back to sitting on the side of the bed? : Unable Difficulty sitting down on and standing up from a chair with arms (e.g., wheelchair, bedside commode, etc,.)?: Unable Help needed moving to and from a bed to chair (including a wheelchair)?: A Little Help needed walking in hospital room?: A Little Help needed climbing 3-5 steps with a railing? : A Little 6 Click Score: 12    End of Session Equipment Utilized During Treatment: Gait belt Activity Tolerance: Patient limited by fatigue Patient left: in chair;with call bell/phone within reach;with nursing/sitter in room Nurse Communication: Mobility status PT Visit Diagnosis: Unsteadiness on feet (R26.81);Other abnormalities of gait and mobility (R26.89);Muscle weakness (generalized) (M62.81)     Time: 2202-5427 PT Time Calculation (min) (ACUTE ONLY): 22 min  Charges:  $Gait Training: 8-22 mins                    G Codes:       Governor Rooks, PTA pager [phone]    Cristela Blue 06/10/2017, 2:31 PM

## 2017-06-10 NOTE — Discharge Summary (Signed)
Physician Discharge Summary       [address]       [place] [address]             [phone]    Patient ID: [name] MRN: [id number] DOB/AGE: [birth year] 72 y.o.  Admit date: 05/22/2017 Discharge date: 06/16/2017  Admission DiagnosIs: Chronic type A ascending aortic dissection [facility])  Discharge Diagnoses:  1. S/p AVR and repair of chronic type A ascending aortic dissection 2.Diastolic congestive heart failure ([place]) 3.  Anemia, chronic disease 4. History of essential hypertension 5. History of CKD (chronic kidney disease) stage 4, GFR 15-29 ml/min (HCC) 6. History of obesity 7. History of DJD (degenerative joint disease) 8. History of acute pancreatitis 9. History of neuropathy 10. History pulmonary HTN ([place]) 11. History of venous insufficiency 12. History of vitamin D deficiency 13. History of frequent UTI    Consults: nephrology and IR  Procedure (s):  Final result                              *[facility]*                         [address], [place] [address]                            [phone]  ------------------------------------------------------------------- Transesophageal Echocardiography  Patient:    [name], [name] MR #:       [id number] Study Date: 05/28/2017 Gender:     F Age:        72 Height:     167.6 cm Weight:     87.1 kg BSA:        2.04 m^2 Pt. Status: Room:       2H22C   ATTENDING    [place], [place], RDCS  PERFORMING   Skeet Latch, MD  Claudia Pollock, [place], [place]    Benjaman Lobe M  cc:  ------------------------------------------------------------------- LV EF: 60% -   65%  ------------------------------------------------------------------- Indications:      Aortic Valve Disorder 424.1 /  I35.9.  ------------------------------------------------------------------- History:   PMH:  Pericardial Effusion.  Dyspnea.  Congestive heart failure.  Risk factors:  Hypertension.  ------------------------------------------------------------------- Study Conclusions  - Left ventricle: There was severe concentric hypertrophy. Systolic   function was normal. The estimated ejection fraction was in the   range of 60% to 65%. Wall motion was normal; there were no   regional wall motion abnormalities. - Aortic valve: There was severe regurgitation. - Aorta: A Stanford type A dissection from the root, involving the   aortic valve to the descending aorta was seen. - Mitral valve: There was mild regurgitation. - Left atrium: No evidence of thrombus in the atrial cavity or   appendage. No evidence of thrombus in the atrial cavity or   appendage. - Right ventricle: The cavity size was normal. Wall thickness was   normal. Systolic function was normal. - Right atrium: No evidence of thrombus in the atrial cavity or  appendage. - Atrial septum: No defect or patent foramen ovale was identified   by color flow Doppler. - Tricuspid valve: There was trivial regurgitation. - Pericardium, extracardiac: A large pericardial effusion measuring   up to 2.97 cm was identified with evidence of loculated thrombus.  Impressions:  - There is a Type A aortic dissection that extends from the right   coronary cusp to the descending aorta.  ------------------------------------------------------------------- Study data:   Study status:  Routine.  Consent:  The risks, benefits, and alternatives to the procedure were explained to the patient and informed consent was obtained.  Procedure:  The patient reported no pain pre or post test. Initial setup. The patient was brought to the laboratory. Surface ECG leads were monitored. Sedation. Moderate sedation was administered by anesthesiology staff.  Transesophageal echocardiography. A transesophageal probe was inserted by the attending cardiologist. Image quality was adequate.  Study completion:  The patient tolerated the procedure well. There were no complications.          Diagnostic transesophageal echocardiography.  2D and color Doppler. Birthdate:  Patient birthdate: 12-Jul-1945.  Age:  Patient is 72 yr old.  Sex:  Gender: female.    BMI: 31 kg/m^2.  Blood pressure: 140/54  Patient status:  Inpatient.  Study date:  Study date: 05/28/2017. Study time: 11:35 AM.  Location:  Endoscopy.  -------------------------------------------------------------------  ------------------------------------------------------------------- Left ventricle:  There was severe concentric hypertrophy. Systolic function was normal. The estimated ejection fraction was in the range of 60% to 65%. Wall motion was normal; there were no regional wall motion abnormalities.  ------------------------------------------------------------------- Aortic valve:   Trileaflet; mildly thickened, mildly calcified leaflets. Cusp separation was normal.  Doppler:  There was severe regurgitation.  ------------------------------------------------------------------- Aorta:  There was no atheroma. A Stanford type A dissection from the root, involving the aortic valve to the descending aorta was seen. Aortic root: The aortic root was not dilated. Ascending aorta: The ascending aorta was normal in size. Aortic arch: The aortic arch was normal in size. Descending aorta: The descending aorta was normal in size.  ------------------------------------------------------------------- Mitral valve:   Structurally normal valve.   Leaflet separation was normal.  Doppler:  There was mild regurgitation.    Mean gradient (D): 2 mm Hg. Peak gradient (D): 3 mm Hg.  ------------------------------------------------------------------- Left atrium:  The atrium was normal in size.  No  evidence of thrombus in the atrial cavity or appendage.  No evidence of thrombus in the atrial cavity or appendage. The appendage was morphologically a left appendage, multilobulated, and of normal size. Emptying velocity was normal.  ------------------------------------------------------------------- Atrial septum:  No defect or patent foramen ovale was identified by color flow Doppler.  ------------------------------------------------------------------- Right ventricle:  The cavity size was normal. Wall thickness was normal. Systolic function was normal.  ------------------------------------------------------------------- Pulmonic valve:    Structurally normal valve.  ------------------------------------------------------------------- Tricuspid valve:   Structurally normal valve.   Leaflet separation was normal.  Doppler:  There was trivial regurgitation.  ------------------------------------------------------------------- Pulmonary artery:   The main pulmonary artery was normal-sized.  ------------------------------------------------------------------- Right atrium:  The atrium was normal in size.  No evidence of thrombus in the atrial cavity or appendage. The appendage was morphologically a right appendage.  ------------------------------------------------------------------- Pericardium:  A large pericardial effusion measuring up to 2.97 cm was identified with evidence of loculated thrombus.     1. Aortic valve replacement. 2. Repair of chronic type A ascending aortic dissection with     replacement of the ascending aorta with a 28 mm straight  Hem[place]ield     graft using hypothermic circulatory arrest and antegrade cerebral     perfusion. 3. Right axillary artery cannulation by Dr. Prescott Gum on 05/30/2017.  4. Right thoracentesis by IR on 06-3/09/2017 5. Left thoracentesis by IR on 06/12/2017  Latest Imaging Study: CLINICAL DATA:  Pleural  effusion.  EXAM: CHEST - 2 VIEW  COMPARISON:  06/12/2017  FINDINGS: Sequelae of aortic valve replacement are again identified. The cardiac silhouette remains mildly enlarged. Small pleural effusions, left larger than right, are stable to slightly decreased in size. Left greater than right basilar airspace opacities have also improved. No pneumothorax is identified.  IMPRESSION: Mildly improved aeration of the lung bases with stable to slightly decreased size of small pleural effusions.   Electronically Signed   By: Logan Bores M.D.   On: 06/16/2017 07:40  History of Presenting Illness: Patient is a 72 year old female with known history of diastolic heart failure, aortic insufficiency and known chronic pericardial effusion that is being monitored by cardiology.  Her primary cardiologist is Dr. Percival Spanish.  She presented to the emergency department yesterday with increasing shortness of breath.  She was found to be hypoxic with saturations in the 80s.  Her dyspnea is felt to be multifactorial and she sees both cardiology and chronic kidney disease as well.  An attempt was made to obtain a chest CT on the date of admission but she was unable to lie flat and was sent to the emergency department.  She denied chest pain, cough or fever.  She has chronic lower extremity edema.  She has had increasing trouble with sleeping due to shortness of breath.  She does have orthopnea.  She was evaluated in the emergency department to require further management as an inpatient.  We are consulted for consideration of pericardial window due to the a large effusion.  Patient has a moderate,chronic pericardial effusion from diastolic HF. Her symptoms are most likely from severe AI and pulmonary edema. Per Dr. Prescott Gum, would not rec pericardial window at this time. Studies then demonstrated subacute dissection of  aortic root which has caused increase in her aortic insufficiency.  The pericardial  effusion was slightly larger but has not altered her hemodynamics. Creatinine baseline 2.8 has increased to 3.4.  Dr. Prescott Gum discussed the need for aortic valve replacement and repair of type A chronic ascending thoracic dissection. Potential risks, benefits, and complications of the surgery were discussed with patient and her husband and patient agreed to proceed with surgery. She underwent the aforementioned surgery on 05/30/2017.   Brief Hospital Course:  The patient was extubated the evening of surgery without difficulty. She remained afebrile and hemodynamically stable. She was weaned off Epinephrine, Nor epinephrine, and Milrinone drips.  Gordy Councilman, a line, chest tubes, and foley were removed early in the post operative course. Lopressor was started and titrated accordingly. Nephrology followed her post op as she has CKD. Diuretics were managed by them. Her last creatine was stable at 2.93. As of 03/11. She is on Lasix 160 mg po bid and this will be continued post op. She had a history of anemia and had anemia post op.  Last H and H was 8.7 and 29.1. She was on Aran esp. She was weaned off the insulin drip.  The patient's HGA1C pre op was  5.3. The patient was felt surgically stable for transfer from the ICU to [facility] for further convalescence on 06/09/2017. She continues to progress with cardiac rehab. She has been weaned  to 2 liters of oxygen via [place]. She will require home oxygen as she continues to desat with ambulation. She has been tolerating a diet and has had a bowel movement. Epicardial pacing wires were removed on 06/09/2017. Chest tube sutures and remaining right axillary staples will be removed the day of discharge. The patient is felt surgically stable for discharge today.   Latest Vital Signs: Blood pressure 137/74, pulse 88, temperature 98.6 F (37 C), temperature source Oral, resp. rate (!) 22, height 5\' 6"  (1.676 m), weight 191 lb 12.8 oz (87 kg), SpO2 99 %.  Physical  Exam: Cardiovascular: RRR Pulmonary: Slightly diminished at bases Abdomen: Soft, non tender, bowel sounds present. Extremities: ++ billateral lower extremity edema. Wounds: Clean and dry.  No erythema or signs of infection.  Discharge Condition: Stable and discharged to home.  Recent laboratory studies:  Lab Results  Component Value Date   WBC 12.5 (H) 06/12/2017   HGB 8.7 (L) 06/12/2017   HCT 29.1 (L) 06/12/2017   MCV 103.6 (H) 06/12/2017   PLT 314 06/12/2017   Lab Results  Component Value Date   NA 138 06/16/2017   K 3.5 06/16/2017   CL 94 (L) 06/16/2017   CO2 34 (H) 06/16/2017   CREATININE 2.93 (H) 06/16/2017   GLUCOSE 109 (H) 06/16/2017      Diagnostic Studies: Ct Abdomen Pelvis Wo Contrast  Result Date: 05/28/2017 CLINICAL DATA:  Evaluate thoracic aortic dissection. EXAM: CT CHEST, ABDOMEN AND PELVIS WITHOUT CONTRAST TECHNIQUE: Multidetector CT imaging of the chest, abdomen and pelvis was performed following the standard protocol without IV contrast. COMPARISON:  CT abdomen pelvis - 02/04/2017 FINDINGS: The lack of intravenous contrast limits the ability to evaluate arterial structures. Evaluation of the chest further degraded secondary streak artifact due to patient body habitus and partially overlying bilateral upper extremities. CT CHEST FINDINGS Vascular Findings: Calcified atherosclerotic plaque is seen scattered throughout the descending thoracic aorta. Apparent intraluminal calcifications are within the aortic arch (image 15, series 3), proximal descending thoracic aorta (image 19, series 3) and the distal aspect of the descending thoracic aorta at the level of the diaphragmatic hiatus (image 46, series 3), potentially compatible with provided history of thoracic aortic dissection however there is no definitive intramural hematoma at either of these locations to suggest definitive acuity on this noncontrast examination. No definitive perivascular stranding on this  non-contrast nongated examination. While the ascending thoracic aorta is of normal caliber, there is mild aneurysmal dilatation of the posterior aspect of the aortic arch and mild ectasia of the descending thoracic aorta with measurements as follows. Suspected bovine configuration of the aortic arch. Cardiomegaly.  Small to moderate-sized pericardial effusion. Enlarged caliber the main pulmonary artery measuring 36 mm in diameter. ------------------------------------------------------------- Thoracic aortic measurements: Sinotubular junction Suboptimally evaluated due to lack of intravenous contrast. Proximal ascending aorta 35 mm as measured in greatest oblique axial dimension at the level of the main pulmonary artery. Aortic arch aorta 39 mm as measured in greatest oblique sagittal dimension (sagittal image 77, series 7) and approximately 43 mm in greatest oblique short axis axial diameter (image 14, series 3). Proximal descending thoracic aorta 37 mm as measured in greatest oblique axial dimension at the level of the main pulmonary artery. Distal descending thoracic aorta 37 mm as measured in greatest oblique axial dimension at the level of the diaphragmatic hiatus. Review of the MIP images confirms the above findings. ------------------------------------------------------------- Non-Vascular Findings: Mediastinum/Lymph Nodes: No definitive bulky mediastinal or hilar lymphadenopathy on this noncontrast examination.  Lungs/Pleura: Complete atelectasis/collapse of the lingula with associated air bronchograms. Partial atelectasis/collapse of the left upper and bilateral lower lobes, also with associated air bronchograms. No pleural effusion. No pneumothorax. Musculoskeletal: No acute or aggressive osseous abnormalities. Stigmata of DISH within the thoracic spine. Accentuated thoracic kyphosis without associated vertebral body compression deformity. Regional soft tissues appear normal. Normal appearance of the thyroid  gland. CT ABDOMEN PELVIS FINDINGS The lack of intravenous contrast limits the ability to evaluate solid abdominal organs as well as the vascular structures of the abdomen and pelvis. Hepatobiliary: Normal hepatic contour. Normal noncontrast appearance of the gallbladder given degree distention. No radiopaque gallstones. No ascites. Pancreas: Normal noncontrast appearance of the pancreas Spleen: Normal noncontrast appearance of the spleen Adrenals/Urinary Tract: Note is made of an approximately 6.4 cm hypoattenuating (8 Hounsfield unit) lesion arising from the inferior pole the left kidney which is incompletely characterized without intravenous contrast though favored to represent a renal cyst. No renal stones. No renal stones are seen along expected course of either ureter or the urinary bladder. No urinary obstruction or perinephric stranding. Normal noncontrast appearance the bilateral adrenal glands. Urinary bladder is decompressed with a Foley catheter. Stomach/Bowel: The sigmoid colon is redundant. Moderate colonic stool burden without evidence of enteric obstruction. Normal noncontrast appearance of the terminal ileum and retrocecal appendix. No pneumoperitoneum, pneumatosis or portal venous gas. Vascular/Lymphatic: While the abdominal aorta tapers to a normal caliber at the level of the diaphragmatic hiatus, re-demonstrated marked tortuosity and mild fusiform aneurysmal dilatation of the infrarenal abdominal aorta measuring approximately 3.2 cm in maximal oblique short axis coronal diameter (coronal image 56, series 6), similar to the 01/2017 abdominal CT. Note is made of a high bifurcation of the abdominal aorta. The bilateral common and external iliac arteries are tortuous but of normal caliber. No bulky retroperitoneal, mesenteric, pelvic or inguinal lymphadenopathy on this noncontrast examination. Reproductive: Post hysterectomy. No discrete adnexal lesion. Small amount of free fluid in the pelvic  cul-de-sac. Other: Mild diffuse body wall anasarca. Nodular areas of subcutaneous edema within the bilateral lower abdominal pannus are favored to be the sequela of prior subcutaneous medication administration. Well-healed midline lower abdominal/pelvic incision. Note is made of a small mesenteric fat containing periumbilical hernia. Musculoskeletal: No acute or aggressive osseous abnormalities. Moderate DDD of L4-L5 with disc space height loss, endplate irregularity and small posteriorly directed disc osteophyte complex at this location. 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 IMPRESSION: Chest CT impression: 1. Lack of intravenous contrast markedly degrades evaluation of arterial structures. Examination is further degraded secondary to patient body habitus and associated streak artifact. 2. Linear intraluminal calcifications within the aortic arch and descending thoracic aorta may be compatible provided history of known thoracic aortic dissection, however there is no definitive intramural hematoma on this noncontrast examination to suggest definitive acuity. No definitive perivascular stranding. 3. Mild aneurysmal dilatation of the distal aspect of the aortic arch and mild ectasia of the descending thoracic aorta measuring approximately 43 mm and 37 mm in diameter respectively. 4. Cardiomegaly with small to moderate-sized pericardial effusion. 5. Complete atelectasis/collapse of the lingula with partial atelectasis/collapse of the left upper and bilateral lower lobes with associated air bronchograms potentially secondary to mass effect due to marked cardiomegaly and pericardial effusion, though underlying infection is not exclude. Abdomen and pelvis CT impression: 1. Marked tortuosity and mild aneurysmal dilatation of the infrarenal abdominal aorta measuring approximately  3.2 cm in  diameter, similar to the 01/2017 abdominal CT. 2.  Aortic aneurysm NOS (ICD10-I71.9). 3. Aortic Atherosclerosis (ICD10-I70.0). Electronically Signed   By: Sandi Mariscal M.D.   On: 05/28/2017 17:24   Dg Chest 1 View  Result Date: 06/12/2017 CLINICAL DATA:  Left-sided thoracentesis. EXAM: CHEST 1 VIEW COMPARISON:  Chest x-ray from same day at 7:04 a.m. FINDINGS: Stable cardiomegaly status post aortic valve replacement. Normal pulmonary vascularity. Slight interval decrease in size of small to moderate left pleural effusion. Unchanged small right pleural effusion. Left-greater-than-right basilar atelectasis again noted. No pneumothorax. No acute osseous abnormality. IMPRESSION: 1. Slight interval decrease in size of small to moderate left pleural effusion. No pneumothorax. 2. Unchanged small right pleural effusion with adjacent atelectasis. Electronically Signed   By: Titus Dubin M.D.   On: 06/12/2017 10:44   Dg Chest 1 View  Result Date: 06/11/2017 CLINICAL DATA:  Status post right thoracentesis. EXAM: CHEST 1 VIEW COMPARISON:  06/10/2017 FINDINGS: Cardiac shadow remains enlarged. Postsurgical changes are again noted. Left basilar changes are again seen with associated effusion. Previously seen right pleural effusion has resolved in the interval. Minimal residual atelectasis is noted. No sizable pneumothorax is seen. No new focal abnormality is noted. IMPRESSION: No pneumothorax following thoracentesis. Electronically Signed   By: Inez Catalina M.D.   On: 06/11/2017 14:54   Dg Chest 2 View  Result Date: 06/16/2017 CLINICAL DATA:  Pleural effusion. EXAM: CHEST - 2 VIEW COMPARISON:  06/12/2017 FINDINGS: Sequelae of aortic valve replacement are again identified. The cardiac silhouette remains mildly enlarged. Small pleural effusions, left larger than right, are stable to slightly decreased in size. Left greater than right basilar airspace opacities have also improved. No pneumothorax is identified. IMPRESSION:  Mildly improved aeration of the lung bases with stable to slightly decreased size of small pleural effusions. Electronically Signed   By: Logan Bores M.D.   On: 06/16/2017 07:40   Dg Chest 2 View  Result Date: 06/12/2017 CLINICAL DATA:  Pleural effusion. EXAM: CHEST - 2 VIEW COMPARISON:  06/11/2017 FINDINGS: Sequelae of aortic valve replacement are again identified. The cardiac silhouette remains enlarged. A small to moderate-sized left pleural effusion with associated left basilar atelectasis or consolidation has not significantly changed from yesterday's study though has increased from 06/10/2017. There is also a small right pleural effusion which appears slightly larger than on yesterday's study with increasing patchy right basilar airspace opacity. No pneumothorax is identified. IMPRESSION: 1. Small right pleural effusion with increasing right basilar atelectasis. 2. Persistent small to moderate-sized left pleural effusion with left basilar atelectasis or consolidation. Electronically Signed   By: Logan Bores M.D.   On: 06/12/2017 09:50   Dg Chest 2 View  Result Date: 06/10/2017 CLINICAL DATA:  Status post aortic valve repair EXAM: CHEST  2 VIEW COMPARISON:  06/09/2017 FINDINGS: Prior aortic valve replacement. Cardiomegaly with vascular congestion. Small bilateral pleural effusions with bibasilar atelectasis. IMPRESSION: Cardiomegaly, vascular congestion. Small bilateral effusions with bibasilar atelectasis. No real change since prior study. Electronically Signed   By: Rolm Baptise M.D.   On: 06/10/2017 08:01   Dg Chest 2 View  Result Date: 06/04/2017 CLINICAL DATA:  Status post aortic valve replacement. EXAM: CHEST  2 VIEW COMPARISON:  Radiograph of June 03, 2017. FINDINGS: Stable cardiomegaly. Left subclavian catheter is unchanged in position. Aortic valve prosthesis is noted. No pneumothorax is noted. Stable bibasilar opacities are noted most consistent with atelectasis with associated pleural  effusions. Bony thorax is unremarkable. IMPRESSION: Stable bibasilar  subsegmental atelectasis is noted with associated pleural effusions. Electronically Signed   By: Marijo Conception, M.D.   On: 06/04/2017 10:25   Dg Chest 2 View  Result Date: 05/22/2017 CLINICAL DATA:  Shortness of breath. EXAM: CHEST  2 VIEW COMPARISON:  Radiographs of April 19, 2017. FINDINGS: Stable severe cardiomegaly is noted. Stable central pulmonary vascular congestion is noted. Interstitial densities are noted in both lung bases concerning for pulmonary edema. No pneumothorax or significant pleural effusion is noted. Bony thorax is unremarkable. IMPRESSION: Stable cardiomegaly with central pulmonary vascular congestion. Probable bilateral pulmonary edema is noted. Electronically Signed   By: Marijo Conception, M.D.   On: 05/22/2017 11:32   Ct Chest Wo Contrast  Result Date: 05/28/2017 CLINICAL DATA:  Evaluate thoracic aortic dissection. EXAM: CT CHEST, ABDOMEN AND PELVIS WITHOUT CONTRAST TECHNIQUE: Multidetector CT imaging of the chest, abdomen and pelvis was performed following the standard protocol without IV contrast. COMPARISON:  CT abdomen pelvis - 02/04/2017 FINDINGS: The lack of intravenous contrast limits the ability to evaluate arterial structures. Evaluation of the chest further degraded secondary streak artifact due to patient body habitus and partially overlying bilateral upper extremities. CT CHEST FINDINGS Vascular Findings: Calcified atherosclerotic plaque is seen scattered throughout the descending thoracic aorta. Apparent intraluminal calcifications are within the aortic arch (image 15, series 3), proximal descending thoracic aorta (image 19, series 3) and the distal aspect of the descending thoracic aorta at the level of the diaphragmatic hiatus (image 46, series 3), potentially compatible with provided history of thoracic aortic dissection however there is no definitive intramural hematoma at either of these  locations to suggest definitive acuity on this noncontrast examination. No definitive perivascular stranding on this non-contrast nongated examination. While the ascending thoracic aorta is of normal caliber, there is mild aneurysmal dilatation of the posterior aspect of the aortic arch and mild ectasia of the descending thoracic aorta with measurements as follows. Suspected bovine configuration of the aortic arch. Cardiomegaly.  Small to moderate-sized pericardial effusion. Enlarged caliber the main pulmonary artery measuring 36 mm in diameter. ------------------------------------------------------------- Thoracic aortic measurements: Sinotubular junction Suboptimally evaluated due to lack of intravenous contrast. Proximal ascending aorta 35 mm as measured in greatest oblique axial dimension at the level of the main pulmonary artery. Aortic arch aorta 39 mm as measured in greatest oblique sagittal dimension (sagittal image 77, series 7) and approximately 43 mm in greatest oblique short axis axial diameter (image 14, series 3). Proximal descending thoracic aorta 37 mm as measured in greatest oblique axial dimension at the level of the main pulmonary artery. Distal descending thoracic aorta 37 mm as measured in greatest oblique axial dimension at the level of the diaphragmatic hiatus. Review of the MIP images confirms the above findings. ------------------------------------------------------------- Non-Vascular Findings: Mediastinum/Lymph Nodes: No definitive bulky mediastinal or hilar lymphadenopathy on this noncontrast examination. Lungs/Pleura: Complete atelectasis/collapse of the lingula with associated air bronchograms. Partial atelectasis/collapse of the left upper and bilateral lower lobes, also with associated air bronchograms. No pleural effusion. No pneumothorax. Musculoskeletal: No acute or aggressive osseous abnormalities. Stigmata of DISH within the thoracic spine. Accentuated thoracic kyphosis without  associated vertebral body compression deformity. Regional soft tissues appear normal. Normal appearance of the thyroid gland. CT ABDOMEN PELVIS FINDINGS The lack of intravenous contrast limits the ability to evaluate solid abdominal organs as well as the vascular structures of the abdomen and pelvis. Hepatobiliary: Normal hepatic contour. Normal noncontrast appearance of the gallbladder given degree distention. No radiopaque gallstones. No ascites. Pancreas: Normal  noncontrast appearance of the pancreas Spleen: Normal noncontrast appearance of the spleen Adrenals/Urinary Tract: Note is made of an approximately 6.4 cm hypoattenuating (8 Hounsfield unit) lesion arising from the inferior pole the left kidney which is incompletely characterized without intravenous contrast though favored to represent a renal cyst. No renal stones. No renal stones are seen along expected course of either ureter or the urinary bladder. No urinary obstruction or perinephric stranding. Normal noncontrast appearance the bilateral adrenal glands. Urinary bladder is decompressed with a Foley catheter. Stomach/Bowel: The sigmoid colon is redundant. Moderate colonic stool burden without evidence of enteric obstruction. Normal noncontrast appearance of the terminal ileum and retrocecal appendix. No pneumoperitoneum, pneumatosis or portal venous gas. Vascular/Lymphatic: While the abdominal aorta tapers to a normal caliber at the level of the diaphragmatic hiatus, re-demonstrated marked tortuosity and mild fusiform aneurysmal dilatation of the infrarenal abdominal aorta measuring approximately 3.2 cm in maximal oblique short axis coronal diameter (coronal image 56, series 6), similar to the 01/2017 abdominal CT. Note is made of a high bifurcation of the abdominal aorta. The bilateral common and external iliac arteries are tortuous but of normal caliber. No bulky retroperitoneal, mesenteric, pelvic or inguinal lymphadenopathy on this noncontrast  examination. Reproductive: Post hysterectomy. No discrete adnexal lesion. Small amount of free fluid in the pelvic cul-de-sac. Other: Mild diffuse body wall anasarca. Nodular areas of subcutaneous edema within the bilateral lower abdominal pannus are favored to be the sequela of prior subcutaneous medication administration. Well-healed midline lower abdominal/pelvic incision. Note is made of a small mesenteric fat containing periumbilical hernia. Musculoskeletal: No acute or aggressive osseous abnormalities. Moderate DDD of L4-L5 with disc space height loss, endplate irregularity and small posteriorly directed disc osteophyte complex at this location. 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 IMPRESSION: Chest CT impression: 1. Lack of intravenous contrast markedly degrades evaluation of arterial structures. Examination is further degraded secondary to patient body habitus and associated streak artifact. 2. Linear intraluminal calcifications within the aortic arch and descending thoracic aorta may be compatible provided history of known thoracic aortic dissection, however there is no definitive intramural hematoma on this noncontrast examination to suggest definitive acuity. No definitive perivascular stranding. 3. Mild aneurysmal dilatation of the distal aspect of the aortic arch and mild ectasia of the descending thoracic aorta measuring approximately 43 mm and 37 mm in diameter respectively. 4. Cardiomegaly with small to moderate-sized pericardial effusion. 5. Complete atelectasis/collapse of the lingula with partial atelectasis/collapse of the left upper and bilateral lower lobes with associated air bronchograms potentially secondary to mass effect due to marked cardiomegaly and pericardial effusion, though underlying infection is not exclude. Abdomen and pelvis CT impression: 1. Marked  tortuosity and mild aneurysmal dilatation of the infrarenal abdominal aorta measuring approximately 3.2 cm in diameter, similar to the 01/2017 abdominal CT. 2.  Aortic aneurysm NOS (ICD10-I71.9). 3. Aortic Atherosclerosis (ICD10-I70.0). Electronically Signed   By: Sandi Mariscal M.D.   On: 05/28/2017 17:24   Dg Chest Port 1 View  Result Date: 06/09/2017 CLINICAL DATA:  72 year old female post replacement ascending aortic aneurysm and repair of chronic dissection. Subsequent encounter. EXAM: PORTABLE CHEST 1 VIEW COMPARISON:  06/07/2017 chest x-ray. FINDINGS: Post median sternotomy and valve replacement. Surgical clips right axilla. Cardiomegaly. Calcified tortuous aorta. Bilateral pleural effusions and basilar atelectasis. Mild central pulmonary vascular prominence without pulmonary edema. No segmental consolidation separate from the basilar consolidation. IMPRESSION: Overall similar  appearance to prior exam. Persistent opacification lung bases suggestive of pleural fluid with atelectasis. Cardiomegaly post valve replacement. Aortic Atherosclerosis (ICD10-I70.0). Electronically Signed   By: Genia Del M.D.   On: 06/09/2017 07:16   Dg Chest Port 1 View  Result Date: 06/07/2017 CLINICAL DATA:  Followup exam.  Chest tube.  Recent cardiac surgery. EXAM: PORTABLE CHEST 1 VIEW COMPARISON:  06/06/2017 and older exams. FINDINGS: Changes from cardiac surgery and valve replacement are stable from the previous day's study. There is prominence of the aortic arch. No mediastinal widening. There is persistent opacity at the lung bases obscuring hemidiaphragms consistent with a combination of pleural effusions and atelectasis. No pulmonary edema. No pneumothorax. Left subclavian central venous line is stable, tip projecting at the confluence of the left brachiocephalic vein and superior vena cava. IMPRESSION: 1. No significant change from the previous day's study. 2. Persistent lung base opacities consistent with combination  of moderate effusions with atelectasis. 3. No pulmonary edema.  No pneumothorax or mediastinal widening. Electronically Signed   By: Lajean Manes M.D.   On: 06/07/2017 07:55   Dg Chest Port 1 View  Result Date: 06/06/2017 CLINICAL DATA:  Aortic valve replacement. EXAM: PORTABLE CHEST 1 VIEW COMPARISON:  06/05/2017. FINDINGS: Surgical staples and clips noted over the right chest. Prior median sternotomy. Prior cardiac valve replacement. Stable cardiomegaly. Persistent bibasilar atelectasis/infiltrates and bilateral pleural effusions. No change. No pneumothorax. IMPRESSION: Prior cardiac valve replacement.  Stable cardiomegaly. 2. Persistent bibasilar atelectasis/infiltrates and bilateral pleural effusions again noted without interim change. Electronically Signed   By: Marcello Moores  Register   On: 06/06/2017 07:45   Dg Chest Port 1 View  Result Date: 06/05/2017 CLINICAL DATA:  Status post AVR EXAM: PORTABLE CHEST 1 VIEW COMPARISON:  Chest radiograph from one day prior. FINDINGS: Intact sternotomy wires. Aortic valve prosthesis is in place. Left subclavian central venous catheter terminates over upper third of the superior vena cava. Surgical clips and staples overlie the right upper chest. Stable cardiomediastinal silhouette with mild cardiomegaly. No pneumothorax. Small bilateral pleural effusions, left greater than right, stable. Cephalization of the pulmonary vasculature without overt pulmonary edema. Moderate bibasilar atelectasis is stable. IMPRESSION: 1. Stable small bilateral pleural effusions with moderate bibasilar atelectasis. 2. Stable cardiomegaly without overt pulmonary edema. Electronically Signed   By: Ilona Sorrel M.D.   On: 06/05/2017 08:00   Dg Chest Port 1 View  Result Date: 06/03/2017 CLINICAL DATA:  Cough and congestion with shortness of breath EXAM: PORTABLE CHEST 1 VIEW COMPARISON:  June 01, 2017 FINDINGS: Chest tubes bilaterally have been removed. Cordis has been removed. Left  subclavian catheter tip is in the left innominate vein, stable. Mediastinal drain has been removed. No pneumothorax. There are pleural effusions bilaterally with patchy airspace consolidation in the left mid lower lung zones. There is mild atelectasis in the right base. There is stable cardiomegaly. There is an aortic valve replacement. No adenopathy. There is aortic atherosclerosis. No bone lesions. IMPRESSION: Several tubes and catheters have been removed. Left subclavian catheter currently with tip in left innominate vein. No pneumothorax. Bilateral pleural effusions noted with patchy atelectasis and consolidation in the left mid lower lung zones as well as right base atelectasis. Stable cardiac prominence. There is aortic atherosclerosis. Aortic Atherosclerosis (ICD10-I70.0). Electronically Signed   By: Lowella Grip III M.D.   On: 06/03/2017 08:55   Dg Chest Port 1 View  Result Date: 06/01/2017 CLINICAL DATA:  Extubated.  Chest tubes.  Follow-up. EXAM: PORTABLE CHEST 1 VIEW COMPARISON:  05/31/2017 FINDINGS: Endotracheal tube is been removed. Swan-Ganz catheter is been removed. Mediastinal drain and bilateral chest tubes remain in place. No pneumothorax. Left subclavian central line tip in the SVC at the azygos level. Persistent left lower lobe atelectasis. IMPRESSION: Endotracheal tube and Swan-Ganz catheter removed. Other lines and tubes remain well positioned. No pneumothorax. Persistent left lower lobe atelectasis. Electronically Signed   By: Nelson Chimes M.D.   On: 06/01/2017 06:41   Dg Chest Port 1 View  Result Date: 05/31/2017 CLINICAL DATA:  Intubated patient. EXAM: PORTABLE CHEST 1 VIEW COMPARISON:  May 30, 2016 FINDINGS: The ETT, left central line, PA catheter, and chest tubes are stable. No pneumothorax. Opacity in the left base is stable. Cardiomediastinal silhouette is unchanged. IMPRESSION: Stable support apparatus.  Stable opacity in the left base. Electronically Signed   By:  Dorise Bullion III M.D   On: 05/31/2017 07:16   Dg Chest Portable 1 View  Result Date: 05/30/2017 CLINICAL DATA:  Central line placement and intubation EXAM: PORTABLE CHEST 1 VIEW COMPARISON:  Chest radiograph 05/29/2017 FINDINGS: Endotracheal tube tip is at the level of the clavicular heads. Right internal jugular vein approach pulmonary arterial catheter tip is in the proximal right pulmonary artery. Left subclavian vein approach central venous catheter tip is at the junction of the left brachiocephalic vein and superior vena cava. There bilateral chest tubes. Status post median sternotomy for aortic valve replacement. There is no pneumothorax. Small left pleural effusion. Multifocal bilateral airspace opacities with possible developing consolidation in the lingula. IMPRESSION: 1. Radiographically appropriate position of endotracheal tube. 2. Pulmonary arterial catheter in the proximal right pulmonary artery. 3. No pneumothorax. 4. Left pleural effusion and bilateral airspace opacities with possible developing consolidation in the lingula. Electronically Signed   By: Ulyses Jarred M.D.   On: 05/30/2017 17:34   Dg Chest Port 1 View  Result Date: 05/29/2017 CLINICAL DATA:  Shortness of breath. EXAM: PORTABLE CHEST 1 VIEW COMPARISON:  CT 05/28/2017.  Chest x-ray 05/22/2017. FINDINGS: Stable severe cardiomegaly. Persistent dense atelectasis/consolidation lingula/left lung base. Right lung is clear. No pleural effusion or pneumothorax. No acute bony abnormality. IMPRESSION: 1.  Stable severe cardiomegaly. 2. Persistent dense atelectasis/consolidation lingula/left lung base. No interim change. Electronically Signed   By: Marcello Moores  Register   On: 05/29/2017 07:57   Dg Abd Portable 1v  Result Date: 05/31/2017 CLINICAL DATA:  Confirm OG tube placement EXAM: PORTABLE ABDOMEN - 1 VIEW COMPARISON:  None. FINDINGS: The OG tube terminates in the left upper quadrant, likely the stomach. IMPRESSION: The OG tube  terminates in the region of the stomach. Electronically Signed   By: Dorise Bullion III M.D   On: 05/31/2017 10:02   Ir Thoracentesis Asp Pleural Space W/img Guide  Result Date: 06/12/2017 INDICATION: Post open heart valve replacement surgery. Left-sided pleural effusion. Request for therapeutic thoracentesis. EXAM: ULTRASOUND GUIDED LEFT THORACENTESIS MEDICATIONS: None. COMPLICATIONS: None immediate. PROCEDURE: An ultrasound guided thoracentesis was thoroughly discussed with the patient and questions answered. The benefits, risks, alternatives and complications were also discussed. The patient understands and wishes to proceed with the procedure. Written consent was obtained. Ultrasound was performed to localize and mark an adequate pocket of fluid in the left chest. The area was then prepped and draped in the normal sterile fashion. 1% Lidocaine was used for local anesthesia. Under ultrasound guidance a Safe-T-Centesis catheter was introduced. Thoracentesis was performed. The catheter was removed and a dressing applied. FINDINGS: A total of approximately only 300 mL of clear amber  colored fluid was removed. IMPRESSION: Successful ultrasound guided left thoracentesis yielding 300 mL of pleural fluid. Read by: Ascencion Dike PA-C Electronically Signed   By: Markus Daft M.D.   On: 06/12/2017 13:00   Ir Thoracentesis Asp Pleural Space W/img Guide  Result Date: 06/11/2017 INDICATION: Recent repair of thoracic aneurysm. Pleural effusions. Request for thoracentesis. EXAM: ULTRASOUND GUIDED RIGHT THORACENTESIS MEDICATIONS: 2% Lidocaine = 10 mL COMPLICATIONS: None immediate. PROCEDURE: An ultrasound guided thoracentesis was thoroughly discussed with the patient and questions answered. The benefits, risks, alternatives and complications were also discussed. The patient understands and wishes to proceed with the procedure. Written consent was obtained. Ultrasound was performed to localize and mark an adequate pocket of  fluid in the right chest. The area was then prepped and draped in the normal sterile fashion. 1% Lidocaine was used for local anesthesia. Under ultrasound guidance a 6 Fr Safe-T-Centesis catheter was introduced. Thoracentesis was performed. The catheter was removed and a dressing applied. FINDINGS: A total of approximately 550 mL of blood tinged fluid was removed. Limited ultrasound of the left chest also revealed moderate pleural effusion. IMPRESSION: Successful ultrasound guided right thoracentesis yielding 550 mL of pleural fluid. No pneumothorax on post procedure chest X-ray. Moderate left pleural effusion. Read by: Gareth Eagle, PA-C Electronically Signed   By: Corrie Mckusick D.O.   On: 06/11/2017 16:41       Discharge Instructions    AMB Referral to [facility]   Complete by:  As directed    Please assign to Telephonic RNCM for CHF disease and symptom management. Has had multiple hospitalizations. Risk score for unplanned readmission is 20%. Declined home visits with [facility]. Written consent obtained. Hospital Liaison spoke with PCP office contact person to make aware [facility] will follow post discharge. Currently at [facility]. Marthenia Rolling, [place], [facility] [phone]   Reason for consult:  Please assign Teleph[facility] RNCM   Diagnoses of:  Heart Failure   Expected date of contact:  1-3 days (reserved for hospital discharges)   AMB Referral to [facility]   Complete by:  As directed    Please assign to [facility] for transition of care. Written consent obtained. Refer to 05/26/17 consult notes for details. Agreeable to [facility] home visits. Hospital Liaison contacted PCP office on 05/26/17 to make aware [facility] will follow post discharge. High risk for readmission. Has had extended length of stay and has multiple co-morbidities, including s/p cardiac surgery. Please call with questions. Thanks. Marthenia Rolling, [place],  [facility] [phone]   Reason for consult:  Please assign to [facility] RNCM   Diagnoses of:  Heart Failure   Expected date of contact:  1-3 days (reserved for hospital discharges)   Amb Referral to Cardiac Rehabilitation   Complete by:  As directed    Diagnosis:  Valve Replacement   Valve:  Aortic      Discharge Medications: Allergies as of 06/16/2017      Reactions   Minoxidil Palpitations, Other (See Comments)   unable to sleep   Naproxen Swelling      Medication List    STOP taking these medications   amLODipine 10 MG tablet Commonly known as:  NORVASC   labetalol 200 MG tablet Commonly known as:  NORMODYNE   losartan 100 MG tablet Commonly known as:  COZAAR   PROCRIT 63845 UNIT/ML injection Generic drug:  epoetin alfa   spironolactone 25 MG tablet Commonly  known as:  ALDACTONE   torsemide 20 MG tablet Commonly known as:  DEMADEX     TAKE these medications   aspirin 325 MG EC tablet Take 1 tablet (325 mg total) by mouth daily.   calcitRIOL 0.25 MCG capsule Commonly known as:  ROCALTROL Take 1 capsule (0.25 mcg total) by mouth every other day. Please take every Monday, Wednesday, Friday What changed:    medication strength  how much to take  when to take this  additional instructions   calcium carbonate 500 MG chewable tablet Commonly known as:  TUMS - dosed in mg elemental calcium Chew 2 tablets by mouth daily as needed for indigestion or heartburn.   carvedilol 3.125 MG tablet Commonly known as:  COREG Take 1 tablet (3.125 mg total) by mouth 2 (two) times daily with a meal.   FAMOTIDINE PO Take 1 tablet by mouth 2 (two) times daily as needed (gas).   fluticasone 50 MCG/ACT nasal spray Commonly known as:  FLONASE Place 2 sprays daily into both nostrils.   furosemide 80 MG tablet Commonly known as:  LASIX Take 2 tablets (160 mg total) by mouth 2 (two) times daily.   gabapentin 300 MG  capsule Commonly known as:  NEURONTIN TAKE 1 CAPSULE(300 MG) BY MOUTH THREE TIMES DAILY AS NEEDED FOR PAIN   hydrALAZINE 10 MG tablet Commonly known as:  APRESOLINE Take 1 tablet (10 mg total) by mouth 2 (two) times daily. What changed:    medication strength  how much to take   hydrocortisone 2.5 % rectal cream Commonly known as:  ANUSOL-HC Place 1 application rectally as needed. What changed:    when to take this  reasons to take this   ondansetron 4 MG tablet Commonly known as:  ZOFRAN Take 1 tablet (4 mg total) by mouth every 8 (eight) hours as needed for nausea or vomiting.   pantoprazole 40 MG tablet Commonly known as:  PROTONIX Take 1 tablet (40 mg total) daily by mouth.   SYSTANE BALANCE 0.6 % Soln Generic drug:  Propylene Glycol Place 1 drop into both eyes daily as needed (for dry eyes).   traMADol 50 MG tablet Commonly known as:  ULTRAM Take 1 tablet (50 mg total) by mouth every 12 (twelve) hours as needed for severe pain. What changed:  when to take this   triamcinolone ointment 0.5 % Commonly known as:  KENALOG Apply 1 application topically daily as needed (itching).            Durable Medical Equipment  (From admission, onward)        Start     Ordered   06/13/17 1510  For home use only DME oxygen  Once    Comments:  Please HHRT evaluate for light weight POC, simplygo  Question Answer Comment  Mode or (Route) Nasal cannula   Liters per Minute 2   Frequency Continuous (stationary and portable oxygen unit needed)   Oxygen delivery system Gas      06/13/17 1510   06/10/17 0746  For home use only DME 4 wheeled rolling walker with seat  Once    Question:  Patient needs a walker to treat with the following condition  Answer:  Decreased ambulation status   06/10/17 0746   06/10/17 0745  For home use only DME 3 n 1  Once     06/10/17 0746     The patient has been discharged on:   1.Beta Blocker:  Yes [  x ]  No    [   ]                              If No, reason:  2.Ace Inhibitor/ARB: Yes [   ]                                     No  [  x  ]                                     If No, reason:Elevated creatinine  3.Statin:   Yes [   ]                  No  [ x  ]                  If No, reason: No CAD  4.S[place]a [place]:  Yes  [ x  ]                  No   [   ]                  If No, reason:  Follow Up Appointments: Follow-up Information    Prescott Gum, Collier [place]ina, MD. Go on 07/23/2017.   Specialty:  Cardiothoracic Surgery Why:  PA/LAT CXR to be taken (at [facility] which is in the same building as Dr. Lucianne Lei Trigt's office) on 07/23/2017 at 1:30 pm ;Appointment time is at 2:00 pm Contact information: [place] [address] [phone]        Lendon Colonel, NP. Go on 06/23/2017.   Specialties:  Nurse Practitioner, Radiology, Cardiology Why:  Appointment time is at 1:30 pm Contact information: [place] [address] [place] Follow up.   Why:  Home Health RN, Physical Therapy- agency will call to arrange initial visit Contact information: [place] Follow up.   Why:  will deliver home oxygen concentrator to home once discharged Contact information: [place] [address] [phone]        Mauricia Area, MD. Go on 06/23/2017.   Specialty:  Nephrology Why:  Please call office for appointment time Contact information: [place] [address] [phone]           Signed: Sharalyn Ink [facility] 06/16/2017, 7:50 AM

## 2017-06-10 NOTE — Progress Notes (Addendum)
      [address]       [place] [address]             [phone]        11 Days Post-Op Procedure(s) (LRB): REPLACEMENT OF ASCENDING AORTIC ANEURYSM AND REPAIRED CHRONIC ROOT DISSECTION WITH CIRC ARREST (N/A) TRANSESOPHAGEAL ECHOCARDIOGRAM (TEE) (N/A) AORTIC VALVE REPLACEMENT (AVR) (N/A)  Subjective: Patient sitting in chair this am. She states she had a large bowel movement yesterday.  Objective: Vital signs in last 24 hours: Temp:  [98.2 F (36.8 C)-98.7 F (37.1 C)] 98.7 F (37.1 C) (03/05 0545) Pulse Rate:  [87-97] 93 (03/05 0545) Cardiac Rhythm: Normal sinus rhythm;Bundle branch block (03/05 0700) Resp:  [15-27] 18 (03/05 0545) BP: (142-165)/(68-92) 142/79 (03/05 0545) SpO2:  [90 %-98 %] 97 % (03/05 0545) Weight:  [193 lb 11.2 oz (87.9 kg)] 193 lb 11.2 oz (87.9 kg) (03/05 0300)  Pre op weight 91.2 kg Current Weight  06/10/17 193 lb 11.2 oz (87.9 kg)      Intake/Output from previous day: 03/04 0701 - 03/05 0700 In: 1020 [P.O.:1020] Out: 2425 [Urine:2425]   Physical Exam:  Cardiovascular: RRR, grade II/VI murmur Pulmonary: Slightly diminished at bases Abdomen: Soft, non tender, bowel sounds present. Extremities: ++ billateral lower extremity edema. Wounds: Clean and dry.  No erythema or signs of infection.  Lab Results: CBC: Recent Labs    06/09/17 0324 06/10/17 0215  WBC 12.5* 13.4*  HGB 8.9* 9.4*  HCT 28.2* 30.1*  PLT 318 361   BMET:  Recent Labs    06/09/17 0324 06/10/17 0215  NA 141 143  K 3.5 4.1  CL 102 102  CO2 28 31  GLUCOSE 116* 120*  BUN 92* 86*  CREATININE 2.75* 2.71*  CALCIUM 10.3 10.8*    PT/INR:  Lab Results  Component Value Date   INR 0.83 05/30/2017   INR 1.09 05/29/2017   INR 1.16 02/05/2017   ABG:  INR: Will add last result for INR, ABG once components are confirmed Will add last 4 CBG results once components are confirmed  Assessment/Plan:  1. CV - SR in the 80's. On Amiodarone 200 mg  bid, Amlodipine 10 mg daily. Hydralazine 10 mg bid. As discussed with Dr. Prescott Gum, start Coreg. If BP still elevated with Coreg will titrate Hydralazine. 2.  Pulmonary - On 1 liter of oxygen via [place]. Wean to room as air as tolerates. After review of today's CXR, will ask IR to do right thoracentesis..Encourage incentive spirometer. 3. Volume Overload - On Lasix 160 mg tid 4.  Acute blood loss anemia - H and H 9.4 and 30.1 5. CKD (stage IV)-Creatinine 2.71. Creatinine upon admission was 2.62 and baseline around 2.5. 6. Staples will be removed prior to discharge 7. Home to discharge with [place] by end of week  Donielle M ZimmermanPA-C 06/10/2017,7:13 AM  Start low dose coreg, watch for recurrent bradycardia Sig R pleural effusion due to chronic kidney disease-we will request ultrasound-guided thoracentesis Plan Home with home health home PT later this week   patient examined and medical record reviewed,agree with above note. Tharon Aquas Trigt III 06/10/2017

## 2017-06-11 ENCOUNTER — Encounter (HOSPITAL_COMMUNITY): Payer: Self-pay | Admitting: Physician Assistant

## 2017-06-11 ENCOUNTER — Inpatient Hospital Stay (HOSPITAL_COMMUNITY): Payer: Medicare Other

## 2017-06-11 HISTORY — PX: IR THORACENTESIS ASP PLEURAL SPACE W/IMG GUIDE: IMG5380

## 2017-06-11 LAB — RENAL FUNCTION PANEL
ALBUMIN: 2.6 g/dL — AB (ref 3.5–5.0)
Anion gap: 8 (ref 5–15)
BUN: 91 mg/dL — ABNORMAL HIGH (ref 6–20)
CHLORIDE: 102 mmol/L (ref 101–111)
CO2: 32 mmol/L (ref 22–32)
CREATININE: 3.03 mg/dL — AB (ref 0.44–1.00)
Calcium: 10.4 mg/dL — ABNORMAL HIGH (ref 8.9–10.3)
GFR, EST AFRICAN AMERICAN: 17 mL/min — AB (ref >60)
GFR, EST NON AFRICAN AMERICAN: 14 mL/min — AB (ref >60)
Glucose, Bld: 110 mg/dL — ABNORMAL HIGH (ref 65–99)
PHOSPHORUS: 3.1 mg/dL (ref 2.5–4.6)
POTASSIUM: 4.9 mmol/L (ref 3.5–5.1)
Sodium: 142 mmol/L (ref 135–145)

## 2017-06-11 MED ORDER — CALCITRIOL 0.25 MCG PO CAPS
0.2500 ug | ORAL_CAPSULE | ORAL | Status: DC
  Administered 2017-06-11 – 2017-06-13 (×2): 0.25 ug via ORAL
  Filled 2017-06-11: qty 1

## 2017-06-11 MED ORDER — LIDOCAINE 2% (20 MG/ML) 5 ML SYRINGE
INTRAMUSCULAR | Status: AC
  Filled 2017-06-11: qty 10

## 2017-06-11 MED ORDER — TRAMADOL HCL 50 MG PO TABS: 50.0000 mg | ORAL_TABLET | Freq: Two times a day (BID) | ORAL | Status: DC | PRN

## 2017-06-11 MED ORDER — FUROSEMIDE 80 MG PO TABS
160.0000 mg | ORAL_TABLET | Freq: Two times a day (BID) | ORAL | Status: DC
  Administered 2017-06-11 – 2017-06-16 (×10): 160 mg via ORAL
  Filled 2017-06-11 (×11): qty 2

## 2017-06-11 MED ORDER — POTASSIUM CHLORIDE 20 MEQ/15ML (10%) PO SOLN
40.0000 meq | Freq: Every day | ORAL | Status: DC
  Administered 2017-06-11: 40 meq via ORAL
  Filled 2017-06-11: qty 30

## 2017-06-11 MED ORDER — LIDOCAINE HCL (PF) 2 % IJ SOLN
INTRAMUSCULAR | Status: DC | PRN
  Administered 2017-06-11: 10 mL

## 2017-06-11 MED ORDER — POTASSIUM CHLORIDE 20 MEQ/15ML (10%) PO SOLN: 20.0000 meq | Freq: Every day | ORAL | Status: DC

## 2017-06-11 NOTE — Procedures (Signed)
PROCEDURE SUMMARY:  Successful US guided right thoracentesis. Yielded 550 mL of blood tinged fluid. Pt tolerated procedure well. No immediate complications.  Limited US of the left chest revealed moderate pleural effusion.  Can perform left thoracentesis if clinically indicated.  Post procedure chest X-ray reveals no pneumothorax  WENDY S BLAIR PA-C 06/11/2017 4:42 PM

## 2017-06-11 NOTE — Progress Notes (Signed)
CARDIAC REHAB PHASE I   PRE:  Rate/Rhythm: 82 SR  BP:  Sitting: 116/69     SaO2: 97% 2L  MODE:  Ambulation: 240 ft   POST:  Rate/Rhythm: 102 ST  BP:  Sitting: 133/80      SaO2: 95% 2L  [id number] Pt assisted from chair to [facility]. Pt voided and able to wipe self.  Pt ambulated 228ft on 2L with Rollator. One sit break for "feeling tired."  Pt needed verbal cues to keep hands on knees during sit to stand to maintain sternal precautions. Pt assisted back to chair with call bell in reach.   Noel Christmas, RN 06/11/2017 10:09 AM

## 2017-06-11 NOTE — Care Management Note (Addendum)
Case Management Note  Patient Details  Name: [name] MRN: [id number] Date of Birth: 1946-02-01  Subjective/Objective:      Aortic Valve Replacement, xray showed bilateral pleural effusions, scheduled Right Thoracentesis on 06/11/2017             Action/Plan: Chart reviewed. Please see previous NCM notes. HH was arranged with [facility]. Confirmed with [facility]. Will continue to follow for dc needs.     06/13/2017 Spoke to pt and states her cousin has RW with seat and 3n1 bedside commode she will bring to pt's home to use. Husband at home to assist with care. States she used oxygen in the past. Pt may need oxygen for home. Requested Unit RN to complete walking oxygen saturation. Pt requesting light weight POC.   Expected Discharge Date:    06/13/2017             Expected Discharge Plan:  [place]  In-House Referral:  NA  Discharge planning Services  CM Consult  Post Acute Care Choice:  Home Health Choice offered to:  Patient  DME Arranged:  NA  DME Agency:  NA  HH Arranged:  RN, PT [place] Agency:  [facility]  Status of Service:  Complete  If discussed at Long Length of Stay Meetings, dates discussed:  06/10/2017 Additional Comments:  Erenest Rasher, RN 06/11/2017, 10:48 AM

## 2017-06-11 NOTE — Progress Notes (Addendum)
      [address]       [place] [address]             [phone]        12 Days Post-Op Procedure(s) (LRB): REPLACEMENT OF ASCENDING AORTIC ANEURYSM AND REPAIRED CHRONIC ROOT DISSECTION WITH CIRC ARREST (N/A) TRANSESOPHAGEAL ECHOCARDIOGRAM (TEE) (N/A) AORTIC VALVE REPLACEMENT (AVR) (N/A)  Subjective: Patient sitting in chair this am eating breakfast. She had nausea yesterday after Amiodarone.  Objective: Vital signs in last 24 hours: Temp:  [97.7 F (36.5 C)-99.2 F (37.3 C)] 98.2 F (36.8 C) (03/06 0313) Pulse Rate:  [81-102] 83 (03/05 2322) Cardiac Rhythm: Normal sinus rhythm;Bundle branch block (03/06 0700) Resp:  [19-27] 19 (03/05 2322) BP: (105-121)/(56-66) 118/62 (03/06 0313) SpO2:  [89 %-98 %] 98 % (03/05 2322) Weight:  [192 lb 14.4 oz (87.5 kg)] 192 lb 14.4 oz (87.5 kg) (03/06 0307)  Pre op weight 91.2 kg Current Weight  06/11/17 192 lb 14.4 oz (87.5 kg)      Intake/Output from previous day: 03/05 0701 - 03/06 0700 In: 720 [P.O.:720] Out: 500 [Urine:500]   Physical Exam:  Cardiovascular: RRR, grade II/VI murmur Pulmonary: Slightly diminished at bases Abdomen: Soft, non tender, bowel sounds present. Extremities: +++ billateral lower extremity edema. Ted hose on both LE. Wounds: Clean and dry.  No erythema or signs of infection.  Lab Results: CBC: Recent Labs    06/09/17 0324 06/10/17 0215  WBC 12.5* 13.4*  HGB 8.9* 9.4*  HCT 28.2* 30.1*  PLT 318 361   BMET:  Recent Labs    06/10/17 0215 06/11/17 0227  NA 143 142  K 4.1 4.9  CL 102 102  CO2 31 32  GLUCOSE 120* 110*  BUN 86* 91*  CREATININE 2.71* 3.03*  CALCIUM 10.8* 10.4*    PT/INR:  Lab Results  Component Value Date   INR 0.83 05/30/2017   INR 1.09 05/29/2017   INR 1.16 02/05/2017   ABG:  INR: Will add last result for INR, ABG once components are confirmed Will add last 4 CBG results once components are confirmed  Assessment/Plan:  1. CV - SR in the  80's and BP much better controlled after addition of Coreg. On Amiodarone 200 mg bid, Coreg 3.125 mg bid, Amlodipine 10 mg daily. Hydralazine 10 mg bid. Will stop Amiodarone secondary to nausea. 2.  Pulmonary - On 1-2 liters of oxygen via [place]. Wean to room as air as tolerates. Awaiting IR to do right thoracentesis. Check CXR in am. Encourage incentive spirometer. 3. Volume Overload - On Lasix 160 mg tid 4.  Anemia - H and H yesterday 9.4 and 30.1. On Aranesp 5. CKD (stage IV)-Creatinine increased to 3.03. Creatinine upon admission was 2.62 and baseline around 2.5. May need to decrease Lasix. Will ask nephrology to re evaluate 6. Will remove every other right axillary staple 7. Will decrease potassium supplement as K up to 4.9 and creatinine increased.  Martha M ZimmermanPA-C 06/11/2017,7:42 AM   Slow progress Stop amiodarone Reduce lasix to BID .pvyco

## 2017-06-11 NOTE — Progress Notes (Signed)
[facility] Re-Consult Note Subjective/Interval History:   Pt had repair of aortic root dissection and ascending aortic aneurysm, aortic valve replacement Renal (Dr. Jimmy Footman) was following for CKD4, AKI on CKD. Other problems of dCHF, CAD, secondary HPT, anemia. (He follows as outpt)   He signed off 3/3 w/creatinine at that time 2.75 on lasix 160 po TID and felt diuresing at acceptable rate. Lasix reduced to BID starting today d/t creatinine ^2.71 to 3 and we were asked to come back and re-evaluate. Weight down from high of 101.9 to current weight of 87.5 kg. Had 550 cc thoracentesis earlier today. Still has mod L pleural effusion on F/U films. Chronic LE edema. Still requiring O2. Sats 97% on 2L  She states feeling better, breathing better since thoracentesis today. Says legs look "about like they do at home, maybe a little bigger". Getting back some energy.   Objective Vital signs in last 24 hours: Vitals:   06/11/17 0313 06/11/17 0753 06/11/17 0818 06/11/17 1241  BP: 118/62 130/68  115/61  Pulse:  86  74  Resp:  18  20  Temp: 98.2 F (36.8 C) 98.4 F (36.9 C)  98.7 F (37.1 C)  TempSrc: Oral Oral  Oral  SpO2:  97% 98% 99%  Weight:      Height:       Weight change: -0.363 kg (-12.8 oz)  Intake/Output Summary (Last 24 hours) at 06/11/2017 1659 Last data filed at 06/11/2017 1242 Gross per 24 hour  Intake 480 ml  Output 500 ml  Net -20 ml   Physical Exam:  Blood pressure 115/61, pulse 74, temperature 98.7 F (37.1 C), temperature source Oral, resp. rate 20, height 5\' 6"  (1.676 m), weight 87.5 kg (192 lb 14.4 oz), SpO2 99 %.  Delightful, talkative AAF Sitting up in the chair NAD Nasal O2 Crackles R base, poor air movement L chest, dull to percussion c/w effusion Sternotomy scar healing S1S2 No S3 Obese abdomen, no focal tenderness Bilateral LE edema pitting (states in part chronic) Alert, oriented X3  Recent Labs  Lab 06/05/17 0306 06/06/17 0400 06/06/17 1750 06/07/17 0702  06/08/17 0410 06/09/17 0324 06/10/17 0215 06/11/17 0227  NA 140 140 142 142 142 141 143 142  K 4.1 3.8 3.8 3.7 3.5 3.5 4.1 4.9  CL 105 105 103 104 104 102 102 102  CO2 25 24  --  27 25 28 31  32  GLUCOSE 121* 108* 100* 107* 104* 116* 120* 110*  BUN 77* 85* 83* 88* 90* 92* 86* 91*  CREATININE 2.50* 2.69* 2.80* 2.75* 2.73* 2.75* 2.71* 3.03*  CALCIUM 9.8 9.8  --  10.1 10.2 10.3 10.8* 10.4*  PHOS 3.7 3.6  --  3.8 3.8 2.8 2.3* 3.1   Recent Labs  Lab 06/09/17 0324 06/10/17 0215 06/11/17 0227  ALBUMIN 2.6* 2.7* 2.6*    Recent Labs  Lab 06/07/17 0702 06/08/17 0410 06/09/17 0324 06/10/17 0215  WBC 12.7* 13.3* 12.5* 13.4*  HGB 8.9* 8.6* 8.9* 9.4*  HCT 28.0* 27.2* 28.2* 30.1*  MCV 97.6 98.2 98.6 99.0  PLT 233 312 318 361    Recent Labs  Lab 06/07/17 1229 06/07/17 1708 06/07/17 2148 06/08/17 0829 06/09/17 0739  GLUCAP 131* 94 115* 124* 125*    Dg Chest 1 View  Result Date: 06/11/2017 CLINICAL DATA:  Status post right thoracentesis. EXAM: CHEST 1 VIEW COMPARISON:  06/10/2017 FINDINGS: Cardiac shadow remains enlarged. Postsurgical changes are again noted. Left basilar changes are again seen with associated effusion. Previously seen right pleural  effusion has resolved in the interval. Minimal residual atelectasis is noted. No sizable pneumothorax is seen. No new focal abnormality is noted. IMPRESSION: No pneumothorax following thoracentesis. Electronically Signed   By: Inez Catalina M.D.   On: 06/11/2017 14:54   Dg Chest 2 View  Result Date: 06/10/2017 CLINICAL DATA:  Status post aortic valve repair EXAM: CHEST  2 VIEW COMPARISON:  06/09/2017 FINDINGS: Prior aortic valve replacement. Cardiomegaly with vascular congestion. Small bilateral pleural effusions with bibasilar atelectasis. IMPRESSION: Cardiomegaly, vascular congestion. Small bilateral effusions with bibasilar atelectasis. No real change since prior study. Electronically Signed   By: Rolm Baptise M.D.   On: 06/10/2017 08:01    Medications: . sodium chloride     . amLODipine  10 mg Oral Daily  . aspirin EC  325 mg Oral Daily  . bisacodyl  10 mg Oral Daily   Or  . bisacodyl  10 mg Rectal Daily  . calcitRIOL  0.25 mcg Oral Daily  . carvedilol  3.125 mg Oral BID WC  . darbepoetin (ARANESP) injection - NON-DIALYSIS  100 mcg Subcutaneous Q Sat  . docusate sodium  200 mg Oral Daily  . enoxaparin (LOVENOX) injection  30 mg Subcutaneous Q24H  . feeding supplement (ENSURE ENLIVE)  237 mL Oral BID BM  . furosemide  160 mg Oral BID  . hydrALAZINE  10 mg Oral BID  . lidocaine      . mometasone-formoterol  2 puff Inhalation BID  . moving right along book   Does not apply Once  . pantoprazole  40 mg Oral Daily  . potassium chloride  40 mEq Oral Daily  . sodium chloride flush  3 mL Intravenous Q12H  . sodium chloride flush  3 mL Intravenous Q12H    Background:  CKD4, AKI on CKD in setting of repair of aortic root dissection and ascending aortic aneurysm, aortic valve replacement.  Other problems of dCHF, CAD, secondary HPT, anemia (outpt Procrit). Dr. Jimmy Footman follows as outpt. On oral diuretics, small bump in creatinine on TID lasix. Asked to see.  Assessment/Recommendations 1. CKD4 - amazingly stable this admission, though variable. Volume still up but weight is 192 lb (pre-op weight 196). Lasix reduced by Dr. Prescott Gum from TID to BID.  Will follow weight and creatinine. From volume standpoint would benefit from left thoracentesis (symptomatically better post R thoracentesis today, has moderate left effusion and poor air mvmt on that side).  2.  K high side of normal on daily dose of 40 mEq. With reduction in lasix, drop K to 20. 3. Anemia - outpt does Procrit at home. Int pt rec'd Aranesp 100 on 06/07/17 and can redose if still here on 3/9. 4. Iron def - had low tsat of 14 on 06/03/17 and appears got Feraheme on 2.27.  5. Secondary HPT - on daily calcitriol; corrected Ca close to 11. Reduce calcitriol to TIW. Can  recheck PTH as outpt 6. Aortic dissection/AoVR repair - per Dr. Prescott Gum 7. DM  Pt has appt already scheduled to see Dr. Jimmy Footman on 06/23/17.   Jamal Maes, MD [facility] [phone] pager 06/11/2017, 4:59 PM

## 2017-06-11 NOTE — Progress Notes (Signed)
Occupational Therapy Treatment Patient Details Name: [name] MRN: [id number] DOB: Jan 11, 1946 Today's Date: 06/11/2017    History of present illness Pt adm with SOB and found to have pulmonary edema and pericardial effusion. Pt found to have type A aortic dissection and aortic insufficiency and underwent AVR and repair of aortic ascending aneurysm on 05/30/17. PMH - CKD, chf, HTN, DJD   OT comments  Pt is demonstrating improvement toward OT goals but remains limited by decreased activity tolerance and understanding of sternal precautions. Reviewed sternal precautions with pt today. She was able to complete toileting hygiene and standing grooming tasks with overall min guard for safety. She required initially min assist for stand-pivot toilet transfer to [facility] from bed (utilized BSC due to urgency) but was able to stand from [facility] with min guard assist. She required seated rest breaks between each ADL task today. She would continue to benefit from OT services while admitted and continue to recommend home health OT services post-acute D/C.     Follow Up Recommendations  Home health OT;Supervision/Assistance - 24 hour    Equipment Recommendations  3 in 1 bedside commode    Recommendations for Other Services      Precautions / Restrictions Precautions Precautions: Sternal;Fall Precaution Booklet Issued: No Precaution Comments: reviewed "move in the tube" with her as well as sit <>stand Restrictions Other Position/Activity Restrictions: Sternal       Mobility Bed Mobility Overal bed mobility: Needs Assistance Bed Mobility: Rolling;Sidelying to Sit Rolling: Min guard Sidelying to sit: Min guard       General bed mobility comments: Moving slowly. Guarding and cues for safety.   Transfers Overall transfer level: Needs assistance Equipment used: Rolling walker (2 wheeled) Transfers: Sit to/from Stand Sit to Stand: Min assist;Min guard         General transfer comment: Min  assist from bed. Min guard from [facility].     Balance Overall balance assessment: Needs assistance Sitting-balance support: No upper extremity supported;Feet supported Sitting balance-Leahy Scale: Good     Standing balance support: Bilateral upper extremity supported Standing balance-Leahy Scale: Fair Standing balance comment: Able to stand unsupported for short period of time                           ADL either performed or assessed with clinical judgement   ADL Overall ADL's : Needs assistance/impaired     Grooming: Oral care;Standing Grooming Details (indicate cue type and reason): Cues for safety.                  Toilet Transfer: Min guard;Stand-pivot;BSC;Minimal assistance Toilet Transfer Details (indicate cue type and reason): Stand-pivot to [facility] due to urgency. Min assist from bed; min guard from [facility].  Toileting- Water quality scientist and Hygiene: Min guard;Cueing for safety Toileting - Clothing Manipulation Details (indicate cue type and reason): Cues for maintaining sternal precautions.     Functional mobility during ADLs: Min guard;Rolling walker;Minimal assistance(rollator) General ADL Comments: Requires therapeutic rest breaks.      Vision       Perception     Praxis      Cognition Arousal/Alertness: Awake/alert Behavior During Therapy: WFL for tasks assessed/performed Overall Cognitive Status: Within Functional Limits for tasks assessed  Exercises     Shoulder Instructions       General Comments      Pertinent Vitals/ Pain       Pain Assessment: No/denies pain  Home Living                                          Prior Functioning/Environment              Frequency  Min 2X/week        Progress Toward Goals  OT Goals(current goals can now be found in the care plan section)  Progress towards OT goals: Progressing toward goals  Acute Rehab OT  Goals Patient Stated Goal: return home with husband OT Goal Formulation: With patient Time For Goal Achievement: 06/17/17 Potential to Achieve Goals: Good  Plan Discharge plan remains appropriate;Frequency remains appropriate    Co-evaluation                 AM-PAC PT "6 Clicks" Daily Activity     Outcome Measure   Help from another person eating meals?: None Help from another person taking care of personal grooming?: A Little Help from another person toileting, which includes using toliet, bedpan, or urinal?: A Little Help from another person bathing (including washing, rinsing, drying)?: A Little Help from another person to put on and taking off regular upper body clothing?: A Lot Help from another person to put on and taking off regular lower body clothing?: A Lot 6 Click Score: 17    End of Session Equipment Utilized During Treatment: Gait belt;Rolling walker  OT Visit Diagnosis: Unsteadiness on feet (R26.81);Muscle weakness (generalized) (M62.81);Pain Pain - part of body: (chest/incisional)   Activity Tolerance Patient tolerated treatment well   Patient Left in chair;with call bell/phone within reach;with family/visitor present   Nurse Communication Mobility status        Time: 1529-1600 OT Time Calculation (min): 31 min  Charges: OT General Charges $OT Visit: 1 Visit OT Treatments $Self Care/Home Management : 23-37 mins  Norman Herrlich, MS OTR/L  Pager: [place] 06/11/2017, 4:55 PM

## 2017-06-12 ENCOUNTER — Encounter (HOSPITAL_COMMUNITY): Payer: Self-pay | Admitting: Radiology

## 2017-06-12 ENCOUNTER — Inpatient Hospital Stay (HOSPITAL_COMMUNITY): Payer: Medicare Other

## 2017-06-12 HISTORY — PX: IR THORACENTESIS ASP PLEURAL SPACE W/IMG GUIDE: IMG5380

## 2017-06-12 LAB — RENAL FUNCTION PANEL
ANION GAP: 10 (ref 5–15)
Albumin: 2.5 g/dL — ABNORMAL LOW (ref 3.5–5.0)
BUN: 97 mg/dL — AB (ref 6–20)
CHLORIDE: 99 mmol/L — AB (ref 101–111)
CO2: 31 mmol/L (ref 22–32)
Calcium: 10.2 mg/dL (ref 8.9–10.3)
Creatinine, Ser: 3.19 mg/dL — ABNORMAL HIGH (ref 0.44–1.00)
GFR calc Af Amer: 16 mL/min — ABNORMAL LOW (ref >60)
GFR calc non Af Amer: 14 mL/min — ABNORMAL LOW (ref >60)
Glucose, Bld: 122 mg/dL — ABNORMAL HIGH (ref 65–99)
PHOSPHORUS: 3.4 mg/dL (ref 2.5–4.6)
POTASSIUM: 5.1 mmol/L (ref 3.5–5.1)
Sodium: 140 mmol/L (ref 135–145)

## 2017-06-12 LAB — CBC
HCT: 29.1 % — ABNORMAL LOW (ref 36.0–46.0)
Hemoglobin: 8.7 g/dL — ABNORMAL LOW (ref 12.0–15.0)
MCH: 31 pg (ref 26.0–34.0)
MCHC: 29.9 g/dL — ABNORMAL LOW (ref 30.0–36.0)
MCV: 103.6 fL — ABNORMAL HIGH (ref 78.0–100.0)
Platelets: 314 10*3/uL (ref 150–400)
RBC: 2.81 MIL/uL — ABNORMAL LOW (ref 3.87–5.11)
RDW: 17.3 % — ABNORMAL HIGH (ref 11.5–15.5)
WBC: 12.5 10*3/uL — ABNORMAL HIGH (ref 4.0–10.5)

## 2017-06-12 MED ORDER — AMLODIPINE BESYLATE 5 MG PO TABS: 5.0000 mg | ORAL_TABLET | Freq: Every day | ORAL | Status: DC

## 2017-06-12 MED ORDER — LIDOCAINE 2% (20 MG/ML) 5 ML SYRINGE
INTRAMUSCULAR | Status: AC
  Filled 2017-06-12: qty 10

## 2017-06-12 NOTE — Progress Notes (Signed)
CARDIAC REHAB PHASE I   PRE:  Rate/Rhythm: 85 SR  BP:  Sitting: 111/57     SaO2: 100% 2L  MODE:  Ambulation: 180 ft   POST:  Rate/Rhythm: 104 ST  BP:  Sitting: 128/62     SaO2: 95% 2L  Pt ambulated 180 ft on 2L with Rollator. Distance limited by fatigue today.  Encouraged pt to walk two more times today. Pt needed less verbal cues today and corrected herself.  Pt assisted back to chair with call bell in reach.   Noel Christmas, RN 06/12/2017 8:54 AM

## 2017-06-12 NOTE — Progress Notes (Addendum)
      [address]       [place] [address]             [phone]        13 Days Post-Op Procedure(s) (LRB): REPLACEMENT OF ASCENDING AORTIC ANEURYSM AND REPAIRED CHRONIC ROOT DISSECTION WITH CIRC ARREST (N/A) TRANSESOPHAGEAL ECHOCARDIOGRAM (TEE) (N/A) AORTIC VALVE REPLACEMENT (AVR) (N/A)  Subjective: Patient sitting in chair this am eating breakfast.   Objective: Vital signs in last 24 hours: Temp:  [98.4 F (36.9 C)-99.2 F (37.3 C)] 99.2 F (37.3 C) (03/07 0259) Pulse Rate:  [74-89] 89 (03/07 0300) Cardiac Rhythm: Normal sinus rhythm;Bundle branch block (03/07 0100) Resp:  [14-23] 23 (03/07 0300) BP: (110-130)/(61-73) 113/64 (03/07 0300) SpO2:  [95 %-99 %] 98 % (03/07 0300) Weight:  [193 lb 4.8 oz (87.7 kg)] 193 lb 4.8 oz (87.7 kg) (03/07 0259)  Pre op weight 91.2 kg Current Weight  06/12/17 193 lb 4.8 oz (87.7 kg)      Intake/Output from previous day: 03/06 0701 - 03/07 0700 In: 480 [P.O.:480] Out: -    Physical Exam:  Cardiovascular: RRR, grade II/VI murmur Pulmonary: Slightly diminished at bases Abdomen: Soft, non tender, bowel sounds present. Extremities: +++ billateral lower extremity edema. Ted hose on both LE. Wounds: Clean and dry.  No erythema or signs of infection.  Lab Results: CBC: Recent Labs    06/10/17 0215 06/12/17 0257  WBC 13.4* 12.5*  HGB 9.4* 8.7*  HCT 30.1* 29.1*  PLT 361 314   BMET:  Recent Labs    06/11/17 0227 06/12/17 0257  NA 142 140  K 4.9 5.1  CL 102 99*  CO2 32 31  GLUCOSE 110* 122*  BUN 91* 97*  CREATININE 3.03* 3.19*  CALCIUM 10.4* 10.2    PT/INR:  Lab Results  Component Value Date   INR 0.83 05/30/2017   INR 1.09 05/29/2017   INR 1.16 02/05/2017   ABG:  INR: Will add last result for INR, ABG once components are confirmed Will add last 4 CBG results once components are confirmed  Assessment/Plan:  1. CV - SR in the 80's and BP much better controlled after addition of Coreg.  On  Coreg 3.125 mg bid, Amlodipine 10 mg daily. Hydralazine 10 mg bid.  2.  Pulmonary - On 1-2 liters of oxygen via [place]. Wean to room as air as tolerates. IR removed 550 ml yesterday. Will ask IR to do a left thoracentesis today as still with moderate pleural effusion. Encourage incentive spirometer. 3. Volume Overload - On Lasix 160 mg bid 4.  Anemia - H and H this am 8.7 and 29.1. On Aranesp 5. CKD (stage IV)-Creatinine increased to 3.19. Creatinine upon admission was 2.62 and baseline around 2.5. Appreciate Dr. Sanda Klein assistance 6. Potassium 5.1. Will stop supplementation   Donielle M ZimmermanPA-C 06/12/2017,7:08 AM   patient examined and medical record reviewed,agree with above note. Tharon Aquas Trigt III 06/12/2017

## 2017-06-12 NOTE — Progress Notes (Addendum)
[facility] Re-Consult Note Interval History:  Pt had repair of aortic root dissection and ascending aortic aneurysm, aortic valve replacement Renal (Dr. Jimmy Footman) was following for CKD4, AKI on CKD. Other problems of dCHF, CAD, secondary HPT, anemia. (He follows as outpt)  He signed off 3/3 w/creatinine at that time 2.75 on lasix 160 po TID and felt diuresing at acceptable rate. Lasix reduced to BID starting today d/t creatinine ^2.71 to 3 and we were asked to come back and re-evaluate. Weight down from high of 101.9 to current weight of 87.5 kg. Had 550 cc thoracentesis earlier today. Still has mod L pleural effusion on F/U films. Chronic LE edema. Still requiring O2. Sats 97% on 2L  Subjective: Just had left thora Says breathing is better Discussed her renal and BP issues Creatinine up a little again today BP's not low but lower end (for her)  Objective Vital signs in last 24 hours: Vitals:   06/11/17 2353 06/12/17 0259 06/12/17 0300 06/12/17 0805  BP: 110/64  113/64   Pulse:   89   Resp:   (!) 23   Temp: 98.7 F (37.1 C) 99.2 F (37.3 C)    TempSrc: Oral Oral    SpO2:   98% 100%  Weight:  87.7 kg (193 lb 4.8 oz)    Height:       Weight change: 0.181 kg (6.4 oz)  Intake/Output Summary (Last 24 hours) at 06/12/2017 1103 Last data filed at 06/12/2017 0900 Gross per 24 hour  Intake 720 ml  Output 200 ml  Net 520 ml   Physical Exam:  Blood pressure 113/64, pulse 89, temperature 99.2 F (37.3 C), temperature source Oral, resp. rate (!) 23, height 5\' 6"  (1.676 m), weight 87.7 kg (193 lb 4.8 oz), SpO2 100 %.  Delightful, talkative AAF NAD Nasal O2 Crackles bases, better air movement overall.  Sternotomy scar healing S1S2 No S3 Obese abdomen, no focal tenderness Bilateral LE edema pitting (states in part chronic) Alert, oriented X3  Recent Labs  Lab 06/06/17 0400 06/06/17 1750 06/07/17 0702 06/08/17 0410 06/09/17 0324 06/10/17 0215 06/11/17 0227 06/12/17 0257  NA 140 142 142  142 141 143 142 140  K 3.8 3.8 3.7 3.5 3.5 4.1 4.9 5.1  CL 105 103 104 104 102 102 102 99*  CO2 24  --  27 25 28 31  32 31  GLUCOSE 108* 100* 107* 104* 116* 120* 110* 122*  BUN 85* 83* 88* 90* 92* 86* 91* 97*  CREATININE 2.69* 2.80* 2.75* 2.73* 2.75* 2.71* 3.03* 3.19*  CALCIUM 9.8  --  10.1 10.2 10.3 10.8* 10.4* 10.2  PHOS 3.6  --  3.8 3.8 2.8 2.3* 3.1 3.4   Recent Labs  Lab 06/10/17 0215 06/11/17 0227 06/12/17 0257  ALBUMIN 2.7* 2.6* 2.5*    Recent Labs  Lab 06/08/17 0410 06/09/17 0324 06/10/17 0215 06/12/17 0257  WBC 13.3* 12.5* 13.4* 12.5*  HGB 8.6* 8.9* 9.4* 8.7*  HCT 27.2* 28.2* 30.1* 29.1*  MCV 98.2 98.6 99.0 103.6*  PLT 312 318 361 314    Recent Labs  Lab 06/07/17 1229 06/07/17 1708 06/07/17 2148 06/08/17 0829 06/09/17 0739  GLUCAP 131* 94 115* 124* 125*    Dg Chest 1 View  Result Date: 06/12/2017 CLINICAL DATA:  Left-sided thoracentesis. EXAM: CHEST 1 VIEW COMPARISON:  Chest x-ray from same day at 7:04 a.m. FINDINGS: Stable cardiomegaly status post aortic valve replacement. Normal pulmonary vascularity. Slight interval decrease in size of small to moderate left pleural effusion. Unchanged small  right pleural effusion. Left-greater-than-right basilar atelectasis again noted. No pneumothorax. No acute osseous abnormality. IMPRESSION: 1. Slight interval decrease in size of small to moderate left pleural effusion. No pneumothorax. 2. Unchanged small right pleural effusion with adjacent atelectasis. Electronically Signed   By: Titus Dubin M.D.   On: 06/12/2017 10:44   Dg Chest 1 View  Result Date: 06/11/2017 CLINICAL DATA:  Status post right thoracentesis. EXAM: CHEST 1 VIEW COMPARISON:  06/10/2017 FINDINGS: Cardiac shadow remains enlarged. Postsurgical changes are again noted. Left basilar changes are again seen with associated effusion. Previously seen right pleural effusion has resolved in the interval. Minimal residual atelectasis is noted. No sizable  pneumothorax is seen. No new focal abnormality is noted. IMPRESSION: No pneumothorax following thoracentesis. Electronically Signed   By: Inez Catalina M.D.   On: 06/11/2017 14:54   Dg Chest 2 View  Result Date: 06/12/2017 CLINICAL DATA:  Pleural effusion. EXAM: CHEST - 2 VIEW COMPARISON:  06/11/2017 FINDINGS: Sequelae of aortic valve replacement are again identified. The cardiac silhouette remains enlarged. A small to moderate-sized left pleural effusion with associated left basilar atelectasis or consolidation has not significantly changed from yesterday's study though has increased from 06/10/2017. There is also a small right pleural effusion which appears slightly larger than on yesterday's study with increasing patchy right basilar airspace opacity. No pneumothorax is identified. IMPRESSION: 1. Small right pleural effusion with increasing right basilar atelectasis. 2. Persistent small to moderate-sized left pleural effusion with left basilar atelectasis or consolidation. Electronically Signed   By: Logan Bores M.D.   On: 06/12/2017 09:50   Ir Thoracentesis Asp Pleural Space W/img Guide  Result Date: 06/11/2017 INDICATION: Recent repair of thoracic aneurysm. Pleural effusions. Request for thoracentesis. EXAM: ULTRASOUND GUIDED RIGHT THORACENTESIS MEDICATIONS: 2% Lidocaine = 10 mL COMPLICATIONS: None immediate. PROCEDURE: An ultrasound guided thoracentesis was thoroughly discussed with the patient and questions answered. The benefits, risks, alternatives and complications were also discussed. The patient understands and wishes to proceed with the procedure. Written consent was obtained. Ultrasound was performed to localize and mark an adequate pocket of fluid in the right chest. The area was then prepped and draped in the normal sterile fashion. 1% Lidocaine was used for local anesthesia. Under ultrasound guidance a 6 Fr Safe-T-Centesis catheter was introduced. Thoracentesis was performed. The catheter was  removed and a dressing applied. FINDINGS: A total of approximately 550 mL of blood tinged fluid was removed. Limited ultrasound of the left chest also revealed moderate pleural effusion. IMPRESSION: Successful ultrasound guided right thoracentesis yielding 550 mL of pleural fluid. No pneumothorax on post procedure chest X-ray. Moderate left pleural effusion. Read by: Gareth Eagle, PA-C Electronically Signed   By: Corrie Mckusick D.O.   On: 06/11/2017 16:41   Medications: . sodium chloride     . amLODipine  10 mg Oral Daily  . aspirin EC  325 mg Oral Daily  . bisacodyl  10 mg Oral Daily   Or  . bisacodyl  10 mg Rectal Daily  . calcitRIOL  0.25 mcg Oral Q M,W,F-1800  . carvedilol  3.125 mg Oral BID WC  . darbepoetin (ARANESP) injection - NON-DIALYSIS  100 mcg Subcutaneous Q Sat  . docusate sodium  200 mg Oral Daily  . enoxaparin (LOVENOX) injection  30 mg Subcutaneous Q24H  . feeding supplement (ENSURE ENLIVE)  237 mL Oral BID BM  . furosemide  160 mg Oral BID  . hydrALAZINE  10 mg Oral BID  . lidocaine      .  mometasone-formoterol  2 puff Inhalation BID  . moving right along book   Does not apply Once  . pantoprazole  40 mg Oral Daily  . sodium chloride flush  3 mL Intravenous Q12H  . sodium chloride flush  3 mL Intravenous Q12H    Background:  CKD4, AKI on CKD in setting of repair of aortic root dissection and ascending aortic aneurysm, aortic valve replacement.  Other problems of dCHF, CAD, secondary HPT, anemia (outpt Procrit). Dr. Jimmy Footman follows as outpt. On oral diuretics, small bump in creatinine on TID lasix. Asked to see.  Assessment/Recommendations 1. CKD4 - amazingly stable this admission, though variable. Volume still up but weight is 192 lb (pre-op weight 196). Lasix reduced by Dr. Prescott Gum from TID to BID.  Would keep there. Creatinine has crept up a little more - I think if drop BP meds a little, allow for better renal perfusion and room for diuresis will start to come  back down.  2. Bilateral pleural effusions - has now had bilateral thoracenteses, breathing some better with that 3. HTN - as above recommend reducing BP meds (to allow more room for diuresis w/out dropping BP too low/reducing renal perfusion). Reduce amlodipine to 5, change to hs (unfortunately already had dose today) 4. Hyperkalemia - dropped dose 40->20 yesterday, K 5.1 today - agree w/stopping 5. Anemia - outpt does Procrit at home. Int pt rec'd Aranesp 100 on 06/07/17 and can redose if still here on 3/9. 6. Iron def - had low tsat of 14 on 06/03/17 and appears got Feraheme on 2/27.  7. Secondary HPT - on daily calcitriol; corrected Ca close to 11. Reduced calcitriol to TIW. Can recheck PTH as outpt 8. Aortic dissection/AoVR repair - per Dr. Prescott Gum 9. DM  Pt has appt already scheduled to see Dr. Jimmy Footman on 06/23/17.   Jamal Maes, MD [facility] [phone] pager 06/12/2017, 11:03 AM

## 2017-06-12 NOTE — Procedures (Signed)
PROCEDURE SUMMARY:  Successful US guided left thoracentesis. Yielded 300 mL of clear amber fluid. Pt tolerated procedure well. No immediate complications.  Specimen was not sent for labs. CXR ordered.  Ascencion Dike PA-C 06/12/2017 10:26 AM

## 2017-06-12 NOTE — Care Management Important Message (Signed)
Important Message  Patient Details  Name: [name] MRN: [id number] Date of Birth: [birth year]   Medicare Important Message Given:  Yes    [name] P Blackford 06/12/2017, 1:15 PM

## 2017-06-13 LAB — RENAL FUNCTION PANEL
ALBUMIN: 2.5 g/dL — AB (ref 3.5–5.0)
Anion gap: 12 (ref 5–15)
BUN: 97 mg/dL — AB (ref 6–20)
CALCIUM: 9.9 mg/dL (ref 8.9–10.3)
CO2: 33 mmol/L — ABNORMAL HIGH (ref 22–32)
CREATININE: 3.19 mg/dL — AB (ref 0.44–1.00)
Chloride: 95 mmol/L — ABNORMAL LOW (ref 101–111)
GFR, EST AFRICAN AMERICAN: 16 mL/min — AB (ref >60)
GFR, EST NON AFRICAN AMERICAN: 14 mL/min — AB (ref >60)
Glucose, Bld: 88 mg/dL (ref 65–99)
Phosphorus: 3.5 mg/dL (ref 2.5–4.6)
Potassium: 4.2 mmol/L (ref 3.5–5.1)
SODIUM: 140 mmol/L (ref 135–145)

## 2017-06-13 NOTE — Progress Notes (Signed)
CKA Re-Consult Note Interval History:  Pt had repair of aortic root dissection and ascending aortic aneurysm, aortic valve replacement Renal (Dr. Jimmy Footman) was following for CKD4, AKI on CKD. Other problems of dCHF, CAD, secondary HPT, anemia. (He follows as outpt)  He signed off 3/3 w/creatinine at that time 2.75 on lasix 160 po TID and felt diuresing at acceptable rate. Lasix reduced to BID starting today d/t creatinine ^2.71 to 3 and we were asked to come back and re-evaluate. Weight down from high of 101.9 to current weight of 87.5 kg. Had 550 cc thoracentesis earlier today. Still has mod L pleural effusion on F/U films. Chronic LE edema. Still requiring O2. Sats 97% on 2L  Subjective: No new complaints Appears amlodipine has been stopped BP still soft side Stable renal function  Objective Vital signs in last 24 hours: Vitals:   06/13/17 1101 06/13/17 1335 06/13/17 1505 06/13/17 1610  BP: (!) 102/55  109/60 115/60  Pulse:  96 85 86  Resp: (!) 27  16 (!) 22  Temp:   98.4 F (36.9 C) 98.2 F (36.8 C)  TempSrc:   Oral Oral  SpO2:  (!) 85% 100% 95%  Weight:      Height:       Weight change: -0.726 kg (-9.6 oz)  Intake/Output Summary (Last 24 hours) at 06/13/2017 1706 Last data filed at 06/13/2017 0800 Gross per 24 hour  Intake 120 ml  Output -  Net 120 ml   Physical Exam:  Blood pressure 115/60, pulse 86, temperature 98.2 F (36.8 C), temperature source Oral, resp. rate (!) 22, height 5\' 6"  (1.676 m), weight 87 kg (191 lb 11.2 oz), SpO2 95 %.  NAD Nasal O2 Crackles bases, better air movement  Sternotomy scar healing S1S2 No S3 Obese abdomen, no focal tenderness Bilateral LE edema pitting unchanged (states in part chronic) Alert, oriented X3  Recent Labs  Lab 06/07/17 0702 06/08/17 0410 06/09/17 0324 06/10/17 0215 06/11/17 0227 06/12/17 0257 06/13/17 0423  NA 142 142 141 143 142 140 140  K 3.7 3.5 3.5 4.1 4.9 5.1 4.2  CL 104 104 102 102 102 99* 95*  CO2 27 25 28  31  32 31 33*  GLUCOSE 107* 104* 116* 120* 110* 122* 88  BUN 88* 90* 92* 86* 91* 97* 97*  CREATININE 2.75* 2.73* 2.75* 2.71* 3.03* 3.19* 3.19*  CALCIUM 10.1 10.2 10.3 10.8* 10.4* 10.2 9.9  PHOS 3.8 3.8 2.8 2.3* 3.1 3.4 3.5   Recent Labs  Lab 06/11/17 0227 06/12/17 0257 06/13/17 0423  ALBUMIN 2.6* 2.5* 2.5*    Recent Labs  Lab 06/08/17 0410 06/09/17 0324 06/10/17 0215 06/12/17 0257  WBC 13.3* 12.5* 13.4* 12.5*  HGB 8.6* 8.9* 9.4* 8.7*  HCT 27.2* 28.2* 30.1* 29.1*  MCV 98.2 98.6 99.0 103.6*  PLT 312 318 361 314    Recent Labs  Lab 06/07/17 1229 06/07/17 1708 06/07/17 2148 06/08/17 0829 06/09/17 0739  GLUCAP 131* 94 115* 124* 125*    Dg Chest 1 View  Result Date: 06/12/2017 CLINICAL DATA:  Left-sided thoracentesis. EXAM: CHEST 1 VIEW COMPARISON:  Chest x-ray from same day at 7:04 a.m. FINDINGS: Stable cardiomegaly status post aortic valve replacement. Normal pulmonary vascularity. Slight interval decrease in size of small to moderate left pleural effusion. Unchanged small right pleural effusion. Left-greater-than-right basilar atelectasis again noted. No pneumothorax. No acute osseous abnormality. IMPRESSION: 1. Slight interval decrease in size of small to moderate left pleural effusion. No pneumothorax. 2. Unchanged small right pleural effusion  with adjacent atelectasis. Electronically Signed   By: Titus Dubin M.D.   On: 06/12/2017 10:44   Dg Chest 2 View  Result Date: 06/12/2017 CLINICAL DATA:  Pleural effusion. EXAM: CHEST - 2 VIEW COMPARISON:  06/11/2017 FINDINGS: Sequelae of aortic valve replacement are again identified. The cardiac silhouette remains enlarged. A small to moderate-sized left pleural effusion with associated left basilar atelectasis or consolidation has not significantly changed from yesterday's study though has increased from 06/10/2017. There is also a small right pleural effusion which appears slightly larger than on yesterday's study with increasing  patchy right basilar airspace opacity. No pneumothorax is identified. IMPRESSION: 1. Small right pleural effusion with increasing right basilar atelectasis. 2. Persistent small to moderate-sized left pleural effusion with left basilar atelectasis or consolidation. Electronically Signed   By: Logan Bores M.D.   On: 06/12/2017 09:50   Ir Thoracentesis Asp Pleural Space W/img Guide  Result Date: 06/12/2017 INDICATION: Post open heart valve replacement surgery. Left-sided pleural effusion. Request for therapeutic thoracentesis. EXAM: ULTRASOUND GUIDED LEFT THORACENTESIS MEDICATIONS: None. COMPLICATIONS: None immediate. PROCEDURE: An ultrasound guided thoracentesis was thoroughly discussed with the patient and questions answered. The benefits, risks, alternatives and complications were also discussed. The patient understands and wishes to proceed with the procedure. Written consent was obtained. Ultrasound was performed to localize and mark an adequate pocket of fluid in the left chest. The area was then prepped and draped in the normal sterile fashion. 1% Lidocaine was used for local anesthesia. Under ultrasound guidance a Safe-T-Centesis catheter was introduced. Thoracentesis was performed. The catheter was removed and a dressing applied. FINDINGS: A total of approximately only 300 mL of clear amber colored fluid was removed. IMPRESSION: Successful ultrasound guided left thoracentesis yielding 300 mL of pleural fluid. Read by: Ascencion Dike PA-C Electronically Signed   By: Markus Daft M.D.   On: 06/12/2017 13:00   Medications: . sodium chloride     . aspirin EC  325 mg Oral Daily  . bisacodyl  10 mg Oral Daily   Or  . bisacodyl  10 mg Rectal Daily  . calcitRIOL  0.25 mcg Oral Q M,W,F-1800  . carvedilol  3.125 mg Oral BID WC  . darbepoetin (ARANESP) injection - NON-DIALYSIS  100 mcg Subcutaneous Q Sat  . docusate sodium  200 mg Oral Daily  . enoxaparin (LOVENOX) injection  30 mg Subcutaneous Q24H  .  feeding supplement (ENSURE ENLIVE)  237 mL Oral BID BM  . furosemide  160 mg Oral BID  . hydrALAZINE  10 mg Oral BID  . mometasone-formoterol  2 puff Inhalation BID  . moving right along book   Does not apply Once  . pantoprazole  40 mg Oral Daily  . sodium chloride flush  3 mL Intravenous Q12H  . sodium chloride flush  3 mL Intravenous Q12H    Background:  CKD4, AKI on CKD in setting of repair of aortic root dissection and ascending aortic aneurysm, aortic valve replacement.  Other problems of dCHF, CAD, secondary HPT, anemia (outpt Procrit). Dr. Jimmy Footman follows as outpt. On oral diuretics, small bump in creatinine on TID lasix. Asked to see.  Assessment/Recommendations 1. CKD4 - amazingly stable this admission, though variable. Volume still up but weight is 191 lb (pre-op weight 196). Lasix reduced by Dr. Prescott Gum from TID to BID.  OK there for now. Creatinine stable 3.19.   2. Bilateral pleural effusions - has now had bilateral thoracenteses, breathing some better with that 3. HTN - BP soft.  Amlodipine off. Carvedilol small dose. 4. Hyperkalemia - stopped K replacement, K normal 5. Anemia - outpt does Procrit at home. Int pt rec'd Aranesp 100 on 06/07/17;  Redose on 3/9. 6. Iron def - had low tsat of 14 on 06/03/17 and appears got Feraheme on 2/27.  7. Secondary HPT - on daily calcitriol; corrected Ca close to 11. Reduced calcitriol to TIW. Can recheck PTH as outpt 8. Aortic dissection/AoVR repair - per Dr. Prescott Gum 9. DM  Pt has appt already scheduled to see Dr. Jimmy Footman on 06/23/17.   Jamal Maes, MD [facility] [phone] pager 06/13/2017, 5:06 PM

## 2017-06-13 NOTE — Progress Notes (Signed)
Occupational Therapy Treatment Patient Details Name: [name] MRN: [id number] DOB: 12-16-1945 Today's Date: 06/13/2017    History of present illness Pt adm with SOB and found to have pulmonary edema and pericardial effusion. Pt found to have type A aortic dissection and aortic insufficiency and underwent AVR and repair of aortic ascending aneurysm on 05/30/17. PMH - CKD, chf, HTN, DJD   OT comments  Pt progressing towards OT goals this session. Reinforcement of sternal precautions and application during ADL throughout session. Pt able to perform BSC transfer, peri care and standing grooming at min guard level. POC remains appropriate. OT will continue to follow acutely.   Follow Up Recommendations  Home health OT;Supervision/Assistance - 24 hour    Equipment Recommendations  3 in 1 bedside commode    Recommendations for Other Services      Precautions / Restrictions Precautions Precautions: Sternal;Fall Precaution Booklet Issued: No Precaution Comments: reviewed "move in the tube" with her as well as sit <>stand Restrictions Weight Bearing Restrictions: Yes Other Position/Activity Restrictions: Sternal       Mobility Bed Mobility Overal bed mobility: Needs Assistance Bed Mobility: Rolling;Sidelying to Sit Rolling: Min guard Sidelying to sit: Min guard       General bed mobility comments: sternal precautions reviewed prior to mobilization  Transfers Overall transfer level: Needs assistance Equipment used: None Transfers: Sit to/from [facility] Sit to Stand: Supervision Stand pivot transfers: Min guard       General transfer comment: supervision from bed and min guard for [facility] transfer    Balance Overall balance assessment: Needs assistance;Mild deficits observed, not formally tested;Modified Independent Sitting-balance support: No upper extremity supported;Feet supported Sitting balance-Leahy Scale: Good     Standing balance support: Bilateral  upper extremity supported Standing balance-Leahy Scale: Fair Standing balance comment: Able to stand unsupported for short period of time                           ADL either performed or assessed with clinical judgement   ADL Overall ADL's : Needs assistance/impaired     Grooming: Wash/dry hands;Wash/dry face;Oral care;Standing Grooming Details (indicate cue type and reason): sink level, cues for safety                 Toilet Transfer: Min guard;Stand-pivot;BSC;Minimal assistance Toilet Transfer Details (indicate cue type and reason): Stand-pivot to [facility] due to urgency. Min assist from bed; min guard from [facility].            General ADL Comments: energy conservation education provided     Vision       Perception     Praxis      Cognition Arousal/Alertness: Awake/alert Behavior During Therapy: WFL for tasks assessed/performed Overall Cognitive Status: Within Functional Limits for tasks assessed                                          Exercises     Shoulder Instructions       General Comments      Pertinent Vitals/ Pain       Pain Assessment: No/denies pain Pain Intervention(s): Monitored during session  Home Living  Prior Functioning/Environment              Frequency  Min 2X/week        Progress Toward Goals  OT Goals(current goals can now be found in the care plan section)  Progress towards OT goals: Progressing toward goals  Acute Rehab OT Goals Patient Stated Goal: return home with husband OT Goal Formulation: With patient Time For Goal Achievement: 06/17/17 Potential to Achieve Goals: Good  Plan Discharge plan remains appropriate;Frequency remains appropriate    Co-evaluation                 AM-PAC PT "6 Clicks" Daily Activity     Outcome Measure   Help from another person eating meals?: None Help from another person taking care of  personal grooming?: A Little Help from another person toileting, which includes using toliet, bedpan, or urinal?: A Little Help from another person bathing (including washing, rinsing, drying)?: A Little Help from another person to put on and taking off regular upper body clothing?: A Lot Help from another person to put on and taking off regular lower body clothing?: A Lot 6 Click Score: 17    End of Session Equipment Utilized During Treatment: Gait belt  OT Visit Diagnosis: Unsteadiness on feet (R26.81);Muscle weakness (generalized) (M62.81);Pain   Activity Tolerance Patient tolerated treatment well   Patient Left in chair;with call bell/phone within reach;with family/visitor present   Nurse Communication Mobility status        Time: 5909-3112 OT Time Calculation (min): 22 min  Charges: OT General Charges $OT Visit: 1 Visit OT Treatments $Self Care/Home Management : 8-22 mins  Hulda Humphrey OTR/L [place] 06/13/2017, 6:02 PM

## 2017-06-13 NOTE — Progress Notes (Signed)
Physical Therapy Treatment Patient Details Name: [name] MRN: [id number] DOB: November 29, 1945 Today's Date: 06/13/2017    History of Present Illness Pt adm with SOB and found to have pulmonary edema and pericardial effusion. Pt found to have type A aortic dissection and aortic insufficiency and underwent AVR and repair of aortic ascending aneurysm on 05/30/17. PMH - CKD, chf, HTN, DJD    PT Comments    Pt continues to make steady but slow progress. Pt received on room air, but ultimately requires supplement O2 to perform basic mobility required for ADL in home. Prior to session, pt has aMB 2x with nursing and cardiac rehab, but limited to ~145ft per EMR. Pt tolerates transfers training well, but is easily wornout and requires additional time to recover between sets. Pt making good progress toward goals overall.   Activity/position flow rate  SpO2:  Notes:   Sitting room air  94% no SOB   AMB @70ft  room air  90% DOE  AMB @140ft  room air  86% DOE  60sec recovery, seated room air  85% SOB  90sec seated recovery 2L/min 89% SOB  2 min seated recovery  2L/min 94% no SOB         Follow Up Recommendations  Home health PT;Supervision/Assistance - 24 hour     Equipment Recommendations  Other (comment)(4WW (rollator) wide width)    Recommendations for Other Services       Precautions / Restrictions Precautions Precautions: Sternal;Fall Precaution Booklet Issued: No Precaution Comments: reviewed "move in the tube" with her as well as sit <>stand    Mobility  Bed Mobility               General bed mobility comments: received up in chair   Transfers Overall transfer level: Needs assistance Equipment used: None Transfers: Sit to/from Stand Sit to Stand: Supervision;Mod assist         General transfer comment: modA from [facility]; supervision from 24" high hospital bed 2x5; desaturationon room, air, but performs 5xSTS on 2L/min with SpO2 @ 90%.    Ambulation/Gait Ambulation/Gait assistance: Min guard;Supervision Ambulation Distance (Feet): 140 Feet Assistive device: Rolling walker (2 wheeled) Gait Pattern/deviations: Step-through pattern;Decreased step length - right;Decreased step length - left;Shuffle;Trunk flexed     General Gait Details: On room air, droped to 90% at 64ft, then 85% at 175ft upon termination of gait trial. Recovery to >88% required supplemental O2.    Stairs            Wheelchair Mobility    Modified Rankin (Stroke Patients Only)       Balance Overall balance assessment: Needs assistance;Mild deficits observed, not formally tested;Modified Independent                                          Cognition Arousal/Alertness: Awake/alert Behavior During Therapy: WFL for tasks assessed/performed Overall Cognitive Status: Within Functional Limits for tasks assessed                                        Exercises Other Exercises Other Exercises: BUE floor taps for deep range hip flexion, and back extension strength: 1x10  Other Exercises: 5xSTS from elevated surface, hands on thighs: 1x5 on RA c desat, 1x5 on 2L/min @90 %SpO2. performed from elevated surface.     General  Comments        Pertinent Vitals/Pain Pain Assessment: No/denies pain    Home Living                      Prior Function            PT Goals (current goals can now be found in the care plan section) Acute Rehab PT Goals Patient Stated Goal: return home with husband PT Goal Formulation: With patient Time For Goal Achievement: 06/16/17 Potential to Achieve Goals: Good Progress towards PT goals: Progressing toward goals    Frequency    Min 3X/week      PT Plan Current plan remains appropriate    Co-evaluation              AM-PAC PT "6 Clicks" Daily Activity  Outcome Measure  Difficulty turning over in bed (including adjusting bedclothes, sheets and  blankets)?: Unable Difficulty moving from lying on back to sitting on the side of the bed? : Unable Difficulty sitting down on and standing up from a chair with arms (e.g., wheelchair, bedside commode, etc,.)?: Unable Help needed moving to and from a bed to chair (including a wheelchair)?: A Little Help needed walking in hospital room?: A Little Help needed climbing 3-5 steps with a railing? : A Lot 6 Click Score: 11    End of Session Equipment Utilized During Treatment: Gait belt;Oxygen Activity Tolerance: Patient limited by fatigue;Patient tolerated treatment well;No increased pain Patient left: in chair;with call bell/phone within reach;with nursing/sitter in room;with family/visitor present Nurse Communication: Mobility status PT Visit Diagnosis: Unsteadiness on feet (R26.81);Other abnormalities of gait and mobility (R26.89);Muscle weakness (generalized) (M62.81)     Time: 3748-2707 PT Time Calculation (min) (ACUTE ONLY): 32 min  Charges:  $Therapeutic Exercise: 8-22 mins $Therapeutic Activity: 8-22 mins                    G Codes:       2:04 PM, 2017-06-21 Etta Grandchild, PT, DPT Physical Therapist - [place] [phone] (Pager)  [phone] (Office)      Buccola,Allan C Jun 21, 2017, 2:01 PM

## 2017-06-13 NOTE — Therapy (Signed)
SATURATION QUALIFICATIONS: (This note is used to comply with regulatory documentation for home oxygen)  Patient Saturations on Room Air at Rest = 94%  Patient Saturations on Room Air while Ambulating = 85%  Patient Saturations on 2 Liters of oxygen while Ambulating = 90%  Please briefly explain why patient needs home oxygen:  Pt encountered a desaturation with AMB and poor recovery of SpO2 at rest on room air. She required 2L/min O2 for recovery to 89% while sitting.   1:51 PM, 06/13/17 Etta Grandchild, PT, DPT Physical Therapist - [place] [phone] (Pager)  [phone] (Office)

## 2017-06-13 NOTE — Care Management Note (Addendum)
Patient Details  Name: [name] MRN: [id number] Date of Birth: 10-19-1945  Subjective/Objective:      Aortic Valve Replacement, xray showed bilateral pleural effusions, scheduled Right Thoracentesis on 06/11/2017, s/p Left Thoracentesis            Action/Plan: Chart reviewed. Please see previous NCM notes. HH was arranged with [facility]. Confirmed with [facility]. Will continue to follow for dc needs.     06/13/2017 Spoke to pt and states her cousin has RW with seat and 3n1 bedside commode she will bring to pt's home to use. Husband at home to assist with care. States she used oxygen in the past. Pt may need oxygen for home. Requested Unit RN to complete walking oxygen saturation. Pt requesting light weight POC.   Please see qualifying oxygen note. Contacted attending for oxygen order. Spoke to , Josie Saunders PA. She will review and order. Contacted [facility] for home oxygen . Scheduled dc Sunday or Monday.    Expected Discharge Date:               Expected Discharge Plan:  [place]  In-House Referral:  NA  Discharge planning Services  CM Consult  Post Acute Care Choice:  Home Health Choice offered to:  Patient  DME Arranged:  Oxygen DME Agency:  [place] Arranged:  RN, PT [facility] Agency:  [place]  Status of Service:  Completed, signed off  If discussed at [place] of Stay Meetings, dates discussed:    Additional Comments:  Erenest Rasher, RN 06/13/2017, 3:13 PM

## 2017-06-13 NOTE — Progress Notes (Addendum)
CARDIAC REHAB PHASE I   PRE:  Rate/Rhythm: 85 SR  BP:  Sitting: 116/55     SaO2: 97% RA  MODE:  Ambulation: 100 ft   POST:  Rate/Rhythm: 88 SR  BP:  Sitting: 102/55     SaO2: 90% when ambulating on RA; 97% on RA with rest     1038-1145 Pt ambulated 100 ft using RW on RA with c/o " feeling tired" but pt is motivated to walk.  Encouraged pt to walk further today. Sats maintained above 90% when walking.  Assisted back to chair. Education completed including sternal precautions, IS use, diet, exercise guidelines, and restrictions.  Verbalized understanding. Referral made to [facility].    No oxygen needs identified during this 100 ft ambulation as sats did not get below 90%.  PT to see pt later. Discussed possibility of oxygen need with therapist.     Noel Christmas, RN 06/13/2017 11:35 AM

## 2017-06-13 NOTE — Progress Notes (Addendum)
SATURATION QUALIFICATIONS: (This note is used to comply with regulatory documentation for home oxygen)  Patient Saturations on Room Air at Rest = 94%  Patient Saturations on Room Air while Ambulating = 84-85%  Patient Saturations on 2 Liters of oxygen while Ambulating =94%  Please briefly explain why patient needs home oxygen:  CHF  Activity/position flow rate  SpO2:  Notes:   Sitting room air  94% no SOB   AMB @70ft  room air  90% DOE  AMB @140ft  room air  86% DOE  60sec recovery, seated room air  85% SOB  90sec seated recovery 2L/min 89% SOB  2 min seated recovery  2L/min 94% no SOB      Please see note from PT, [employer]   Jonnie Finner RN CCM Case Mgmt phone [phone]

## 2017-06-13 NOTE — Progress Notes (Addendum)
      [address]       [place] [address]             [phone]        14 Days Post-Op Procedure(s) (LRB): REPLACEMENT OF ASCENDING AORTIC ANEURYSM AND REPAIRED CHRONIC ROOT DISSECTION WITH CIRC ARREST (N/A) TRANSESOPHAGEAL ECHOCARDIOGRAM (TEE) (N/A) AORTIC VALVE REPLACEMENT (AVR) (N/A)  Subjective: Patient states her breathing is better. She has no complaints this am.  Objective: Vital signs in last 24 hours: Temp:  [97.7 F (36.5 C)-98.9 F (37.2 C)] 98.2 F (36.8 C) (03/08 0431) Pulse Rate:  [73-82] 79 (03/08 0431) Cardiac Rhythm: Normal sinus rhythm (03/07 1959) Resp:  [15-22] 19 (03/08 0454) BP: (105-117)/(52-69) 114/61 (03/08 0431) SpO2:  [94 %-100 %] 94 % (03/08 0600) Weight:  [191 lb 11.2 oz (87 kg)] 191 lb 11.2 oz (87 kg) (03/08 0454)  Pre op weight 91.2 kg Current Weight  06/13/17 191 lb 11.2 oz (87 kg)      Intake/Output from previous day: 03/07 0701 - 03/08 0700 In: 360 [P.O.:360] Out: 300 [Urine:300]   Physical Exam:  Cardiovascular: RRR Pulmonary: Slightly diminished at bases but improved aeration overall Abdomen: Soft, non tender, bowel sounds present. Extremities: +++ billateral lower extremity edema. Ted hose on both LE. Wounds: Clean and dry.  No erythema or signs of infection.  Lab Results: CBC: Recent Labs    06/12/17 0257  WBC 12.5*  HGB 8.7*  HCT 29.1*  PLT 314   BMET:  Recent Labs    06/12/17 0257 06/13/17 0423  NA 140 140  K 5.1 4.2  CL 99* 95*  CO2 31 33*  GLUCOSE 122* 88  BUN 97* 97*  CREATININE 3.19* 3.19*  CALCIUM 10.2 9.9    PT/INR:  Lab Results  Component Value Date   INR 0.83 05/30/2017   INR 1.09 05/29/2017   INR 1.16 02/05/2017   ABG:  INR: Will add last result for INR, ABG once components are confirmed Will add last 4 CBG results once components are confirmed  Assessment/Plan:  1. CV - SR in the 80's and BP much better controlled after addition of Coreg. On  Coreg 3.125 mg  bid and Hydralazine 10 mg bid. Amlodipine stopped to allow for higher BP for continued diuresis. 2.  Pulmonary - On 1-2 liters of oxygen via [place].  Will need oxygen at discharge. IR removed 300 ml yesterday from left pleural space. Encourage incentive spirometer. 3. Volume Overload - On Lasix 160 mg bid 4.  Anemia - Last H and H  8.7 and 29.1. On Aranesp 5. CKD (stage IV)-Creatinine remains 3.19. Creatinine upon admission was 2.62 and baseline around 2.5. Appreciate Dr. Sanda Klein assistance 6. Will discuss discharge disposition with Dr. Prescott Gum   [name] 06/13/2017,7:17 AM Titrating meds Home with home O2 Monday patient examined and medical record reviewed,agree with above note. [name] 06/13/2017

## 2017-06-13 NOTE — Consult Note (Signed)
   [facility] CM Inpatient Consult   06/13/2017  [name] 1945-10-11 [id number]    [facility] Care Management follow up.  Chart reviewed. [name] remains hospitalized on stepdown unit.   [name] has already been referred for [place] follow up once she is discharged from hospital.  Will continue to follow.    Marthenia Rolling, MSN-Ed, RN,BSN [facility] Liaison [phone]

## 2017-06-14 LAB — RENAL FUNCTION PANEL
Albumin: 2.4 g/dL — ABNORMAL LOW (ref 3.5–5.0)
Anion gap: 12 (ref 5–15)
BUN: 96 mg/dL — AB (ref 6–20)
CHLORIDE: 94 mmol/L — AB (ref 101–111)
CO2: 32 mmol/L (ref 22–32)
Calcium: 9.5 mg/dL (ref 8.9–10.3)
Creatinine, Ser: 3.12 mg/dL — ABNORMAL HIGH (ref 0.44–1.00)
GFR calc non Af Amer: 14 mL/min — ABNORMAL LOW (ref >60)
GFR, EST AFRICAN AMERICAN: 16 mL/min — AB (ref >60)
Glucose, Bld: 98 mg/dL (ref 65–99)
POTASSIUM: 4 mmol/L (ref 3.5–5.1)
Phosphorus: 3.5 mg/dL (ref 2.5–4.6)
Sodium: 138 mmol/L (ref 135–145)

## 2017-06-14 NOTE — Progress Notes (Signed)
[facility] Re-Consult Note Interval History:  Pt had repair of aortic root dissection and ascending aortic aneurysm, aortic valve replacement Renal (Dr. Jimmy Footman) was following for CKD4, AKI on CKD. Other problems of dCHF, CAD, secondary HPT, anemia. (He follows as outpt)  He signed off 3/3 w/creatinine at that time 2.75 on lasix 160 po TID and felt diuresing at acceptable rate. Lasix reduced to BID starting today d/t creatinine ^2.71 to 3 and we were asked to come back and re-evaluate. Weight down from high of 101.9 to current weight of 87.5 kg. Had 550 cc thoracentesis earlier today. Still has mod L pleural effusion on F/U films. Chronic LE edema. Still requiring O2. Sats 97% on 2L  Subjective: No new complaints Renal fx stable Weight stable 192 lb  Objective Vital signs in last 24 hours: Vitals:   06/14/17 0818 06/14/17 0853 06/14/17 0919 06/14/17 1200  BP: 116/66  116/66 124/70  Pulse: 85  82 85  Resp: 20     Temp: (!) 97.3 F (36.3 C)     TempSrc:      SpO2:  94%    Weight:      Height:       Weight change: 0.272 kg (9.6 oz)  Intake/Output Summary (Last 24 hours) at 06/14/2017 1208 Last data filed at 06/13/2017 1752 Gross per 24 hour  Intake 360 ml  Output 400 ml  Net -40 ml   Physical Exam:  Blood pressure 124/70, pulse 85, temperature (!) 97.3 F (36.3 C), resp. rate 20, height 5\' 6"  (1.676 m), weight 87.2 kg (192 lb 4.8 oz), SpO2 94 %.  NAD Nasal O2 Crackles bases, better air movement  Sternotomy scar healing S1S2 No S3 Obese abdomen, no focal tenderness Bilateral LE edema pitting unchanged Alert, oriented X3  Recent Labs  Lab 06/08/17 0410 06/09/17 0324 06/10/17 0215 06/11/17 0227 06/12/17 0257 06/13/17 0423 06/14/17 0236  NA 142 141 143 142 140 140 138  K 3.5 3.5 4.1 4.9 5.1 4.2 4.0  CL 104 102 102 102 99* 95* 94*  CO2 25 28 31  32 31 33* 32  GLUCOSE 104* 116* 120* 110* 122* 88 98  BUN 90* 92* 86* 91* 97* 97* 96*  CREATININE 2.73* 2.75* 2.71* 3.03* 3.19*  3.19* 3.12*  CALCIUM 10.2 10.3 10.8* 10.4* 10.2 9.9 9.5  PHOS 3.8 2.8 2.3* 3.1 3.4 3.5 3.5   Recent Labs  Lab 06/12/17 0257 06/13/17 0423 06/14/17 0236  ALBUMIN 2.5* 2.5* 2.4*    Recent Labs  Lab 06/08/17 0410 06/09/17 0324 06/10/17 0215 06/12/17 0257  WBC 13.3* 12.5* 13.4* 12.5*  HGB 8.6* 8.9* 9.4* 8.7*  HCT 27.2* 28.2* 30.1* 29.1*  MCV 98.2 98.6 99.0 103.6*  PLT 312 318 361 314    Recent Labs  Lab 06/07/17 1229 06/07/17 1708 06/07/17 2148 06/08/17 0829 06/09/17 0739  GLUCAP 131* 94 115* 124* 125*    Medications: . sodium chloride     . aspirin EC  325 mg Oral Daily  . bisacodyl  10 mg Oral Daily   Or  . bisacodyl  10 mg Rectal Daily  . calcitRIOL  0.25 mcg Oral Q M,W,F-1800  . carvedilol  3.125 mg Oral BID WC  . darbepoetin (ARANESP) injection - NON-DIALYSIS  100 mcg Subcutaneous Q Sat  . docusate sodium  200 mg Oral Daily  . enoxaparin (LOVENOX) injection  30 mg Subcutaneous Q24H  . feeding supplement (ENSURE ENLIVE)  237 mL Oral BID BM  . furosemide  160 mg Oral BID  .  hydrALAZINE  10 mg Oral BID  . mometasone-formoterol  2 puff Inhalation BID  . moving right along book   Does not apply Once  . pantoprazole  40 mg Oral Daily  . sodium chloride flush  3 mL Intravenous Q12H  . sodium chloride flush  3 mL Intravenous Q12H    Background:  CKD4, AKI on CKD in setting of repair of aortic root dissection and ascending aortic aneurysm, aortic valve replacement.  Other problems of dCHF, CAD, secondary HPT, anemia (outpt Procrit). Dr. Jimmy Footman follows as outpt. On oral diuretics, small bump in creatinine on TID lasix. Asked to see.  Assessment/Recommendations 1. CKD4 - amazingly stable this admission, though variable. Volume still up but weight is 191 lb (pre-op weight 196). Lasix reduced by Dr. Prescott Gum from TID to BID.  OK there for now. Creatinine stable 3.12.  2. Bilateral pleural effusions - has now had bilateral thoracenteses, breathing some better with  that 3. HTN - BP soft. Amlodipine off. Carvedilol small dose. 4. Hyperkalemia - stopped K replacement, K normal 5. Anemia - outpt does Procrit at home. Int pt rec'd Aranesp 100 on 06/07/17;  Redose on 3/9 today. 6. Iron def - had low tsat of 14 on 06/03/17 and appears got Feraheme on 2/27.  7. Secondary HPT - on daily calcitriol; corrected Ca close to 11. Reduced calcitriol to TIW. Can recheck PTH as outpt 8. Aortic dissection/AoVR repair - per Dr. Prescott Gum 9. DM   Pt has appt scheduled to see Dr. Jimmy Footman on 06/23/17. No additional recommendations. Renal will sign off at this time.  Jamal Maes, MD [facility] [phone] pager 06/14/2017, 12:08 PM

## 2017-06-14 NOTE — Plan of Care (Signed)
Pt is progressing 

## 2017-06-14 NOTE — Progress Notes (Signed)
CARDIAC REHAB PHASE I   PRE:  Rate/Rhythm: 86 SR  BP:  Sitting: 114/60      SaO2: 98% 2L  MODE:  Ambulation: 110 ft   POST:  Rate/Rhythm: 108 ST  BP:  Sitting: 128/66      SaO2: 98% 2L  Pt in recliner, willing to walk. Pt ambulated 110 ft with RW on 2L of O2. Gait was steady, but slow. Pt denied any complaints of CP, dizziness or SOB. Pt did have fatigue upon exertion and stated her legs were stiff. Pt asked more about cardiac rehab and stated she as interested. A referral has been sent to [facility]. Pt returned to recliner. Call bell within reach.   [phone]  Carma Lair MS, ACSM CEP  3:23 PM 06/14/2017

## 2017-06-14 NOTE — Progress Notes (Addendum)
      [address]       [place] [address]             [phone]      15 Days Post-Op Procedure(s) (LRB): REPLACEMENT OF ASCENDING AORTIC ANEURYSM AND REPAIRED CHRONIC ROOT DISSECTION WITH CIRC ARREST (N/A) TRANSESOPHAGEAL ECHOCARDIOGRAM (TEE) (N/A) AORTIC VALVE REPLACEMENT (AVR) (N/A) Subjective: Feels good this morning. No issues overnight.   Objective: Vital signs in last 24 hours: Temp:  [97.9 F (36.6 C)-98.8 F (37.1 C)] 98.8 F (37.1 C) (03/09 0433) Pulse Rate:  [77-96] 77 (03/09 0433) Cardiac Rhythm: Normal sinus rhythm;Bundle branch block (03/08 1900) Resp:  [13-27] 19 (03/09 0439) BP: (90-116)/(52-64) 116/61 (03/09 0433) SpO2:  [85 %-100 %] 100 % (03/09 0433) Weight:  [192 lb 4.8 oz (87.2 kg)] 192 lb 4.8 oz (87.2 kg) (03/09 0439)     Intake/Output from previous day: 03/08 0701 - 03/09 0700 In: 480 [P.O.:480] Out: 500 [Urine:500] Intake/Output this shift: No intake/output data recorded.  General appearance: alert, cooperative and no distress Heart: regular rate and rhythm, S1, S2 normal, no murmur, click, rub or gallop Lungs: some diffuse crackles Abdomen: soft, non-tender; bowel sounds normal; no masses,  no organomegaly Extremities: 3-4+ pitting pedal edema Wound: clean and dry  Lab Results: Recent Labs    06/12/17 0257  WBC 12.5*  HGB 8.7*  HCT 29.1*  PLT 314   BMET:  Recent Labs    06/13/17 0423 06/14/17 0236  NA 140 138  K 4.2 4.0  CL 95* 94*  CO2 33* 32  GLUCOSE 88 98  BUN 97* 96*  CREATININE 3.19* 3.12*  CALCIUM 9.9 9.5    PT/INR: No results for input(s): LABPROT, INR in the last 72 hours. ABG    Component Value Date/Time   PHART 7.396 06/01/2017 0453   HCO3 24.5 06/01/2017 0453   TCO2 28 06/06/2017 1750   ACIDBASEDEF 2.0 05/31/2017 1255   O2SAT 95.0 06/01/2017 0453   CBG (last 3)  No results for input(s): GLUCAP in the last 72 hours.  Assessment/Plan: S/P Procedure(s) (LRB): REPLACEMENT OF ASCENDING  AORTIC ANEURYSM AND REPAIRED CHRONIC ROOT DISSECTION WITH CIRC ARREST (N/A) TRANSESOPHAGEAL ECHOCARDIOGRAM (TEE) (N/A) AORTIC VALVE REPLACEMENT (AVR) (N/A)   1. CV - SR in the 80's and BP well controlled. On  Coreg 3.125 mg bid and Hydralazine 10 mg bid. Amlodipine stopped to allow for higher BP for continued diuresis. EPW have been removed. 2.  Pulmonary - On 2 liters of oxygen via [place].  Will need oxygen at discharge. IR removed 300 ml on 3/7 from left pleural space. Encourage incentive spirometer. 3. Volume Overload - On Lasix 160 mg bid, Nephrology following. Creatinine 3.12 today. Electrolytes okay.  4.  Anemia - Last H and H  8.7 and 29.1. On Aranesp 5. Endo-blood glucose controlled on current regimen   Plan: continue to optimize renal function. Plan for home Monday with home health. Will likely need oxygen on discharge.    LOS: 23 days    Elgie Collard 06/14/2017   Chart reviewed, patient examined, agree with above.

## 2017-06-15 LAB — RENAL FUNCTION PANEL
ALBUMIN: 2.5 g/dL — AB (ref 3.5–5.0)
Anion gap: 11 (ref 5–15)
BUN: 96 mg/dL — ABNORMAL HIGH (ref 6–20)
CALCIUM: 9.5 mg/dL (ref 8.9–10.3)
CO2: 34 mmol/L — AB (ref 22–32)
Chloride: 94 mmol/L — ABNORMAL LOW (ref 101–111)
Creatinine, Ser: 2.97 mg/dL — ABNORMAL HIGH (ref 0.44–1.00)
GFR calc Af Amer: 17 mL/min — ABNORMAL LOW (ref >60)
GFR calc non Af Amer: 15 mL/min — ABNORMAL LOW (ref >60)
GLUCOSE: 107 mg/dL — AB (ref 65–99)
PHOSPHORUS: 3.6 mg/dL (ref 2.5–4.6)
Potassium: 3.7 mmol/L (ref 3.5–5.1)
SODIUM: 139 mmol/L (ref 135–145)

## 2017-06-15 MED ORDER — DARBEPOETIN ALFA 100 MCG/0.5ML IJ SOSY
100.0000 ug | PREFILLED_SYRINGE | INTRAMUSCULAR | Status: DC
  Administered 2017-06-15: 100 ug via SUBCUTANEOUS
  Filled 2017-06-15: qty 0.5

## 2017-06-15 NOTE — Progress Notes (Signed)
SATURATION QUALIFICATIONS: (This note is used to comply with regulatory documentation for home oxygen)  Patient Saturations on Room Air at Rest = 93%  Patient Saturations on Room Air while Ambulating 50 feet = 83% Patient Saturations on Room Air while Ambulating 100 feet = 73%  Patient Saturations on 1 Liters of oxygen while Ambulating = 91%  Please briefly explain why patient needs home oxygen: Patient drops her O2 Sat to a low of 73% after walking 100 feet.

## 2017-06-15 NOTE — Progress Notes (Addendum)
      [address]       [place] [address]             [phone]      16 Days Post-Op Procedure(s) (LRB): REPLACEMENT OF ASCENDING AORTIC ANEURYSM AND REPAIRED CHRONIC ROOT DISSECTION WITH CIRC ARREST (N/A) TRANSESOPHAGEAL ECHOCARDIOGRAM (TEE) (N/A) AORTIC VALVE REPLACEMENT (AVR) (N/A) Subjective: Feels good this morning. She feels like each day she is feeling better and stronger.   Objective: Vital signs in last 24 hours: Temp:  [97.3 F (36.3 C)-99.6 F (37.6 C)] 99.6 F (37.6 C) (03/10 0516) Pulse Rate:  [82-99] 99 (03/10 0516) Cardiac Rhythm: Normal sinus rhythm;Bundle branch block (03/10 0700) Resp:  [20] 20 (03/09 0818) BP: (114-124)/(66-70) 114/67 (03/10 0516) SpO2:  [94 %-96 %] 96 % (03/10 0516) Weight:  [193 lb (87.5 kg)] 193 lb (87.5 kg) (03/10 0516)     Intake/Output from previous day: 03/09 0701 - 03/10 0700 In: 1200 [P.O.:1200] Out: 1900 [Urine:1900] Intake/Output this shift: No intake/output data recorded.  General appearance: alert, cooperative and no distress Heart: regular rate and rhythm, S1, S2 normal, no murmur, click, rub or gallop Lungs: clear to auscultation bilaterally Abdomen: soft, non-tender; bowel sounds normal; no masses,  no organomegaly Extremities: 3-4+ bilaterally pitting pedal edema Wound: clean and dry  Lab Results: No results for input(s): WBC, HGB, HCT, PLT in the last 72 hours. BMET:  Recent Labs    06/13/17 0423 06/14/17 0236  NA 140 138  K 4.2 4.0  CL 95* 94*  CO2 33* 32  GLUCOSE 88 98  BUN 97* 96*  CREATININE 3.19* 3.12*  CALCIUM 9.9 9.5    PT/INR: No results for input(s): LABPROT, INR in the last 72 hours. ABG    Component Value Date/Time   PHART 7.396 06/01/2017 0453   HCO3 24.5 06/01/2017 0453   TCO2 28 06/06/2017 1750   ACIDBASEDEF 2.0 05/31/2017 1255   O2SAT 95.0 06/01/2017 0453   CBG (last 3)  No results for input(s): GLUCAP in the last 72 hours.  Assessment/Plan: S/P  Procedure(s) (LRB): REPLACEMENT OF ASCENDING AORTIC ANEURYSM AND REPAIRED CHRONIC ROOT DISSECTION WITH CIRC ARREST (N/A) TRANSESOPHAGEAL ECHOCARDIOGRAM (TEE) (N/A) AORTIC VALVE REPLACEMENT (AVR) (N/A)  1. CV - SR in the 80's and BP well controlled. On Coreg 3.125 mg bid andHydralazine 10 mg bid. EPW have been removed. 2. Pulmonary - On 2 liters of oxygen via [place] with good oxygen saturation. Will need oxygen at discharge.IR removed 300 ml on 3/7 from left pleural space. Encourage incentive spirometer. 3. Volume Overload - On Lasix 160 mg bid, Nephrology following. Creatinine 3.12. Electrolytes okay.  4. Anemia - LastH and H 8.7 and 29.1. On Aranesp 5. Endo-blood glucose controlled on current regimen   Plan: home tomorrow if she remains stable with home health and home oxygen.    LOS: 24 days    Elgie Collard 06/15/2017   Chart reviewed, patient examined, agree with above. Will repeat 2V CXR in am to see how it looks before she goes home.

## 2017-06-15 NOTE — Progress Notes (Signed)
[name] ambulated in the hall for approximately 100 feet with pulse ox on room air.  At approximately 14 feet, [name] O2 Sat dropped from 93 % to 83 % for approximately 1 minute;  O2 Sat rebounded to 93-94%.  Once [name] had walked 100 feet, her O2 Sat dropped again to 73 % for approximately 1 minute and rebounded to 91%.  Patient tolerated well and required minimal assistance.

## 2017-06-15 NOTE — Plan of Care (Signed)
Pt is progressing 

## 2017-06-16 ENCOUNTER — Inpatient Hospital Stay (HOSPITAL_COMMUNITY): Payer: Medicare Other

## 2017-06-16 LAB — RENAL FUNCTION PANEL
ANION GAP: 10 (ref 5–15)
Albumin: 2.4 g/dL — ABNORMAL LOW (ref 3.5–5.0)
BUN: 94 mg/dL — ABNORMAL HIGH (ref 6–20)
CHLORIDE: 94 mmol/L — AB (ref 101–111)
CO2: 34 mmol/L — AB (ref 22–32)
Calcium: 9.3 mg/dL (ref 8.9–10.3)
Creatinine, Ser: 2.93 mg/dL — ABNORMAL HIGH (ref 0.44–1.00)
GFR calc Af Amer: 17 mL/min — ABNORMAL LOW (ref >60)
GFR calc non Af Amer: 15 mL/min — ABNORMAL LOW (ref >60)
GLUCOSE: 109 mg/dL — AB (ref 65–99)
POTASSIUM: 3.5 mmol/L (ref 3.5–5.1)
Phosphorus: 3.5 mg/dL (ref 2.5–4.6)
Sodium: 138 mmol/L (ref 135–145)

## 2017-06-16 MED ORDER — FUROSEMIDE 80 MG PO TABS: 160.0000 mg | ORAL_TABLET | Freq: Two times a day (BID) | ORAL | 1 refills | Status: DC

## 2017-06-16 MED ORDER — CALCITRIOL 0.25 MCG PO CAPS: 0.2500 ug | ORAL_CAPSULE | ORAL | 1 refills | Status: DC

## 2017-06-16 MED ORDER — TRIAMCINOLONE ACETONIDE 0.5 % EX OINT: 1.0000 "application " | TOPICAL_OINTMENT | Freq: Every day | CUTANEOUS | Status: DC | PRN

## 2017-06-16 MED ORDER — ASPIRIN 325 MG PO TBEC: 325.0000 mg | DELAYED_RELEASE_TABLET | Freq: Every day | ORAL | 0 refills | Status: DC

## 2017-06-16 MED ORDER — CARVEDILOL 3.125 MG PO TABS: 3.1250 mg | ORAL_TABLET | Freq: Two times a day (BID) | ORAL | 1 refills | Status: DC

## 2017-06-16 MED ORDER — POTASSIUM CHLORIDE 20 MEQ/15ML (10%) PO SOLN
30.0000 meq | Freq: Every day | ORAL | Status: DC
  Administered 2017-06-16: 30 meq via ORAL
  Filled 2017-06-16: qty 30

## 2017-06-16 MED ORDER — TRAMADOL HCL 50 MG PO TABS: 50.0000 mg | ORAL_TABLET | Freq: Two times a day (BID) | ORAL | 0 refills | Status: DC | PRN

## 2017-06-16 MED ORDER — HYDRALAZINE HCL 10 MG PO TABS: 10.0000 mg | ORAL_TABLET | Freq: Two times a day (BID) | ORAL | 1 refills | Status: DC

## 2017-06-16 MED ORDER — POTASSIUM CHLORIDE CRYS ER 20 MEQ PO TBCR
30.0000 meq | EXTENDED_RELEASE_TABLET | Freq: Once | ORAL | Status: DC
  Filled 2017-06-16: qty 1

## 2017-06-16 NOTE — Progress Notes (Signed)
Occupational Therapy Treatment Patient Details Name: [name] MRN: [id number] DOB: May 30, 1945 Today's Date: 06/16/2017    History of present illness Pt adm with SOB and found to have pulmonary edema and pericardial effusion. Pt found to have type A aortic dissection and aortic insufficiency and underwent AVR and repair of aortic ascending aneurysm on 05/30/17. PMH - CKD, chf, HTN, DJD   OT comments  Anticipate DC home today and pt states she is excited but a little anxious. Discussed areas that are causing her anxiety and addressed these areas during today's session as noted below. Pt continues to require min A with ADL and min A with sit - stand from lower surfaces. Reviewed sternal precautions and energy conservation. Pt asking questions regarding how she is going to manage her IADL tasks at home. Continue to recommend HHOT to to help this previously independent lady return to her PLOF and reduce risk  of falls/readmission. Nsg notified of need for [place].   Follow Up Recommendations  Home health OT;Supervision/Assistance - 24 hour    Equipment Recommendations  3 in 1 bedside commode    Recommendations for Other Services      Precautions / Restrictions Precautions Precautions: Sternal;Fall Precaution Comments: pt is ableto verbalize "move in the tube" Restrictions Weight Bearing Restrictions: Yes RUE Weight Bearing: (strernal precautions) LUE Weight Bearing: (Sternal precautions) Other Position/Activity Restrictions: Sternal       Mobility Bed Mobility               General bed mobility comments: OOB in chair  Transfers Overall transfer level: Needs assistance   Transfers: Sit to/from Stand Sit to Stand: Min assist         General transfer comment: pt continues to have more difficulty standing from lower surfaces and requires min A with use of momentum. Pt/husband educated on need to select higher level seating to increase adherance to sternal precautions.     Balance             Standing balance-Leahy Scale: Fair                             ADL either performed or assessed with clinical judgement   ADL Overall ADL's : Needs assistance/impaired             Lower Body Bathing: Minimal assistance Lower Body Bathing Details (indicate cue type and reason): recommend to purchase long handled sponge to assist with bathing     Lower Body Dressing: Minimal assistance;Sit to/from stand Lower Body Dressing Details (indicate cue type and reason): easily fatigues; difficulty with reaching feet. Would benefit from reacher or other AE to assist with dressing Toilet Transfer: Min guard;BSC   Toileting- Clothing Manipulation and Hygiene: Min Child psychotherapist Details (indicate cue type and reason): Pt states she is anxious about stepping over her tub. Recommend pt complete this with her HHOT. Functional mobility during ADLs: Min guard General ADL Comments: Pt anxious about getting into her house. States she is close enough to walk but concerned about her step. Pt has a collumn to hold onto and her husband can assist. Simulated entry adn pt able to complete with min A. Pt issued a gait belt to use to provide extra support- husbnad verbalized understanding on use of gait belt. Discussed option of using rollator @ house for energy conservation and to reduce risk of falls     Vision  Perception     Praxis      Cognition Arousal/Alertness: Awake/alert Behavior During Therapy: WFL for tasks assessed/performed Overall Cognitive Status: Within Functional Limits for tasks assessed                                          Exercises     Shoulder Instructions       General Comments  VSS during session. O2 93 RA after activity    Pertinent Vitals/ Pain       Pain Assessment: Faces Faces Pain Scale: Hurts a little bit Pain Location: B knees Pain Descriptors / Indicators: Discomfort Pain  Intervention(s): Limited activity within patient's tolerance  Home Living                                          Prior Functioning/Environment              Frequency  Min 2X/week        Progress Toward Goals  OT Goals(current goals can now be found in the care plan section)  Progress towards OT goals: Progressing toward goals  Acute Rehab OT Goals Patient Stated Goal: return home with husband OT Goal Formulation: With patient Time For Goal Achievement: 06/17/17 Potential to Achieve Goals: Good ADL Goals Pt Will Perform Lower Body Bathing: with set-up;with supervision;with caregiver independent in assisting;sit to/from stand Pt Will Perform Lower Body Dressing: with supervision;with set-up;with caregiver independent in assisting;sit to/from stand Pt Will Transfer to Toilet: with modified independence;ambulating;bedside commode Pt Will Perform Toileting - Clothing Manipulation and hygiene: with modified independence;sitting/lateral leans;sit to/from stand Additional ADL Goal #1: Pt will independently demonstrate sternal precautions during ADL  Plan Discharge plan remains appropriate;Frequency remains appropriate    Co-evaluation                 AM-PAC PT "6 Clicks" Daily Activity     Outcome Measure   Help from another person eating meals?: None Help from another person taking care of personal grooming?: A Little Help from another person toileting, which includes using toliet, bedpan, or urinal?: A Little Help from another person bathing (including washing, rinsing, drying)?: A Little Help from another person to put on and taking off regular upper body clothing?: A Little Help from another person to put on and taking off regular lower body clothing?: A Little 6 Click Score: 19    End of Session Equipment Utilized During Treatment: Gait belt  OT Visit Diagnosis: Unsteadiness on feet (R26.81);Muscle weakness (generalized) (M62.81);Pain Pain  - part of body: (B knees)   Activity Tolerance Patient tolerated treatment well   Patient Left in chair;with call bell/phone within reach;with family/visitor present   Nurse Communication Mobility status;Other (comment)(HHOT needed - nsg aware)        Time: 1050-1116 OT Time Calculation (min): 26 min  Charges: OT General Charges $OT Visit: 1 Visit OT Treatments $Self Care/Home Management : 23-37 mins  [facility], OT/L  [phone] 06/16/2017   Vanassa Penniman,HILLARY 06/16/2017, 11:37 AM

## 2017-06-16 NOTE — Consult Note (Signed)
   [facility] CM Inpatient Consult   06/16/2017  Jase Himmelberger 04/21/1945 [id number]    [facility] Care Management follow up.  Mrs. Khaimov discharged home today.   Will make [place] aware of discharge today.   Marthenia Rolling, MSN-Ed, RN,BSN [facility] Liaison [phone]

## 2017-06-16 NOTE — Progress Notes (Signed)
Progress Note  Patient Name: [name] Date of Encounter: 06/16/2017  Primary Cardiologist:   Minus Breeding, MD   Subjective   No chest pain. Cough but no SOB  Inpatient Medications    Scheduled Meds: . aspirin EC  325 mg Oral Daily  . bisacodyl  10 mg Oral Daily   Or  . bisacodyl  10 mg Rectal Daily  . calcitRIOL  0.25 mcg Oral Q M,W,F-1800  . carvedilol  3.125 mg Oral BID WC  . darbepoetin (ARANESP) injection - NON-DIALYSIS  100 mcg Subcutaneous Q Sun-1800  . docusate sodium  200 mg Oral Daily  . enoxaparin (LOVENOX) injection  30 mg Subcutaneous Q24H  . feeding supplement (ENSURE ENLIVE)  237 mL Oral BID BM  . furosemide  160 mg Oral BID  . hydrALAZINE  10 mg Oral BID  . mometasone-formoterol  2 puff Inhalation BID  . moving right along book   Does not apply Once  . pantoprazole  40 mg Oral Daily  . potassium chloride  30 mEq Oral Daily  . sodium chloride flush  3 mL Intravenous Q12H  . sodium chloride flush  3 mL Intravenous Q12H   Continuous Infusions: . sodium chloride     PRN Meds: sodium chloride, gabapentin, hydrALAZINE, lidocaine, ondansetron (ZOFRAN) IV, oxyCODONE, sodium chloride flush, sodium chloride flush, traMADol   Vital Signs    Vitals:   06/16/17 0300 06/16/17 0741 06/16/17 0745 06/16/17 0841  BP: 115/76 137/74 137/74   Pulse: 90 88    Resp:  20 (!) 22   Temp: 98.6 F (37 C) 98.6 F (37 C)    TempSrc: Oral Oral    SpO2: 99%   95%  Weight:      Height:        Intake/Output Summary (Last 24 hours) at 06/16/2017 0855 Last data filed at 06/15/2017 2354 Gross per 24 hour  Intake 840 ml  Output 700 ml  Net 140 ml   Filed Weights   06/14/17 0439 06/15/17 0516 06/16/17 0237  Weight: 192 lb 4.8 oz (87.2 kg) 193 lb (87.5 kg) 191 lb 12.8 oz (87 kg)    Telemetry    NSR - Personally Reviewed  ECG    NA - Personally Reviewed  Physical Exam   GEN: No acute distress.   Neck: No  JVD Cardiac: RRR, soft apical systolic murmur, no  diastolic murmurs, rubs, or gallops.  Respiratory:   Decreased breath sounds at the bases GI: Soft, nontender, non-distended  MS: Moderate leg edema; No deformity. Neuro:  Nonfocal  Psych: Normal affect   Labs    Chemistry Recent Labs  Lab 06/14/17 0236 06/15/17 0553 06/16/17 0256  NA 138 139 138  K 4.0 3.7 3.5  CL 94* 94* 94*  CO2 32 34* 34*  GLUCOSE 98 107* 109*  BUN 96* 96* 94*  CREATININE 3.12* 2.97* 2.93*  CALCIUM 9.5 9.5 9.3  ALBUMIN 2.4* 2.5* 2.4*  GFRNONAA 14* 15* 15*  GFRAA 16* 17* 17*  ANIONGAP 12 11 10      Hematology Recent Labs  Lab 06/10/17 0215 06/12/17 0257  WBC 13.4* 12.5*  RBC 3.04* 2.81*  HGB 9.4* 8.7*  HCT 30.1* 29.1*  MCV 99.0 103.6*  MCH 30.9 31.0  MCHC 31.2 29.9*  RDW 16.9* 17.3*  PLT 361 314    Cardiac EnzymesNo results for input(s): TROPONINI in the last 168 hours. No results for input(s): TROPIPOC in the last 168 hours.   BNPNo results for input(s): BNP,  PROBNP in the last 168 hours.   DDimer No results for input(s): DDIMER in the last 168 hours.   Radiology    Dg Chest 2 View  Result Date: 06/16/2017 CLINICAL DATA:  Pleural effusion. EXAM: CHEST - 2 VIEW COMPARISON:  06/12/2017 FINDINGS: Sequelae of aortic valve replacement are again identified. The cardiac silhouette remains mildly enlarged. Small pleural effusions, left larger than right, are stable to slightly decreased in size. Left greater than right basilar airspace opacities have also improved. No pneumothorax is identified. IMPRESSION: Mildly improved aeration of the lung bases with stable to slightly decreased size of small pleural effusions. Electronically Signed   By: Logan Bores M.D.   On: 06/16/2017 07:40    Cardiac Studies   Tee 05/30/17  Result status: Final result   Septum: No Patent Foramen Ovale present.  Left atrium: Patent foramen ovale not present.  Aortic valve: The valve is trileaflet. No stenosis. Moderate to severe regurgitation. No AV  vegetation.  Aorta: The aortic root is dilated. Aneurysm present in the ascending aorta, transverse aorta and descending aorta. 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  Mitral valve: Mild regurgitation.  Right ventricle: Normal cavity size, wall thickness and ejection fraction.  Tricuspid valve: Trace regurgitation.  Pericardium: Large circumferential pericardial effusion.  Aorta: Graft present in the ascending aorta.     Patient Profile     72 y.o. female with a hx of known chronic pericardial effusion since 2018 (however progressively becoming larger), diastolic heart failure (EF 60-65% with moderate aortic insufficiency and mitral regurgitationwithgrade 1 DD (per echo 04/20/17), LBBB, CAD, chronic bilateral LE edema, HTN, and CKD stage III who wasbeing seen for the evaluation oflarge pericardial effusionandsevereSOB.  She was subsequently found to have ascending aortic dissection and is now status post repair.      Assessment & Plan    AORTIC DISSECTION:  She is now status post AVR, aortic root replacement.  Possibly home today.  She has follow up in our clinic on 3/18.      ACUTE ON CHRONIC DIASTOLIC DYSFUNCTION:   On Lasix 160 mg PO BID.   She understands need to keep her feet elevated and reduce salt and fluid intake.   CKD IV:  Creat up slightly from baseline but remarkably preserved given the nature of her surgery.  We will follow labs closely.    HTN:  BPs well controlled on current meds.    For questions or updates, please contact [place] Please consult [email or web address] for contact info under Cardiology/STEMI.   Signed, Minus Breeding, MD  06/16/2017, 8:55 AM

## 2017-06-16 NOTE — Progress Notes (Addendum)
Order for staple removal (R shoulder) and CT sutures removal completed.  Assessed pt and staples (rt shoulder) were already removed by Nils Pyle, MD.  CT sutures still present and removed by myself, Luetta Nutting, RN.  No complication from suture removal.

## 2017-06-16 NOTE — Care Management Important Message (Signed)
Important Message  Patient Details  Name: [name] MRN: [id number] Date of Birth: [birth year]   Medicare Important Message Given:  Yes    [name] P [place] 06/16/2017, 12:12 PM

## 2017-06-16 NOTE — Care Management Note (Signed)
Previous CM note completed by Erenest Rasher, RN 06/13/2017, 3:13 PM   Patient Details  Name: [name] MRN: [id number] Date of Birth: 12/17/1945  Subjective/Objective:      Aortic Valve Replacement, xray showed bilateral pleural effusions, scheduled Right Thoracentesis on 06/11/2017, s/p Left Thoracentesis            Action/Plan: Chart reviewed. Please see previous NCM notes. HH was arranged with [facility]. Confirmed with [facility]. Will continue to follow for dc needs.     06/13/2017 Spoke to pt and states her cousin has RW with seat and 3n1 bedside commode she will bring to pt's home to use. Husband at home to assist with care. States she used oxygen in the past. Pt may need oxygen for home. Requested Unit RN to complete walking oxygen saturation. Pt requesting light weight POC.   Please see qualifying oxygen note. Contacted attending for oxygen order. Spoke to , Josie Saunders PA. She will review and order. Contacted [facility] for home oxygen . Scheduled dc Sunday or Monday.    Expected Discharge Date:               Expected Discharge Plan:  [place]  In-House Referral:  NA  Discharge planning Services  CM Consult  Post Acute Care Choice:  Home Health Choice offered to:  Patient  DME Arranged:  Oxygen, Walker rolling with seat, 3-N-1 DME Agency:  [place] Arranged:  RN, PT, OT [facility] Agency:  [place]  Status of Service:  Completed, signed off  If discussed at [place] of Stay Meetings, dates discussed:    Discharge Disposition: home/home health   Additional Comments:  06/16/17- [place] RN, CM- pt for d/c home today- DME already delivered to room per bedside RN Lattie Haw, orders for HHRN/PT/OT placed- spoke with Tommi Rumps with [facility] for [facility] needs- referral given last week- they will f/u with pt on transition home.   Marvetta Gibbons [place], RN 06/16/2017, 11:33 AM [phone] 4E Transition Care Coordinator

## 2017-06-16 NOTE — Progress Notes (Signed)
1028 Offered to walk with pt. She declined. Wants to save her energy for trip home. Graylon Good RN BSN 06/16/2017 10:28 AM

## 2017-06-16 NOTE — Progress Notes (Addendum)
      [address]       [place] [address]             [phone]        17 Days Post-Op Procedure(s) (LRB): REPLACEMENT OF ASCENDING AORTIC ANEURYSM AND REPAIRED CHRONIC ROOT DISSECTION WITH CIRC ARREST (N/A) TRANSESOPHAGEAL ECHOCARDIOGRAM (TEE) (N/A) AORTIC VALVE REPLACEMENT (AVR) (N/A)  Subjective: Patient states her breathing is better. She has no complaints this am.  Objective: Vital signs in last 24 hours: Temp:  [98.6 F (37 C)-100 F (37.8 C)] 98.6 F (37 C) (03/11 0300) Pulse Rate:  [87-99] 90 (03/11 0300) Cardiac Rhythm: Normal sinus rhythm;Bundle branch block;Heart block (03/11 0700) BP: (101-123)/(58-76) 115/76 (03/11 0300) SpO2:  [90 %-99 %] 99 % (03/11 0300) Weight:  [191 lb 12.8 oz (87 kg)] 191 lb 12.8 oz (87 kg) (03/11 0237)  Pre op weight 91.2 kg Current Weight  06/16/17 191 lb 12.8 oz (87 kg)      Intake/Output from previous day: 03/10 0701 - 03/11 0700 In: 960 [P.O.:960] Out: 900 [Urine:900]   Physical Exam:  Cardiovascular: RRR Pulmonary: Slightly diminished at bases but improved aeration overall Abdomen: Soft, non tender, bowel sounds present. Extremities: ++ billateral lower extremity edema. Ted hose on both LE. Wounds: Clean and dry.  No erythema or signs of infection.  Lab Results: CBC: No results for input(s): WBC, HGB, HCT, PLT in the last 72 hours. BMET:  Recent Labs    06/15/17 0553 06/16/17 0256  NA 139 138  K 3.7 3.5  CL 94* 94*  CO2 34* 34*  GLUCOSE 107* 109*  BUN 96* 94*  CREATININE 2.97* 2.93*  CALCIUM 9.5 9.3    PT/INR:  Lab Results  Component Value Date   INR 0.83 05/30/2017   INR 1.09 05/29/2017   INR 1.16 02/05/2017   ABG:  INR: Will add last result for INR, ABG once components are confirmed Will add last 4 CBG results once components are confirmed  Assessment/Plan:  1. CV - SR. On  Coreg 3.125 mg bid and Hydralazine 10 mg bid. 2.  Pulmonary - On 2 liters of oxygen via [place].  Will  need oxygen at discharge. Previous bilateral thoracenteses. CXR this am appears stable (shows bilateral pleural effusions, no pneumothorax).Encourage incentive spirometer. 3. Volume Overload - On Lasix 160 mg bid 4.  Anemia - Last H and H  8.7 and 29.1. On Aranesp 5. CKD (stage IV)-Creatinine slightly decreased to 2.93. Creatinine upon admission was 2.62 and baseline around 2.5. Appreciate Dr. Sanda Klein assistance 6. Gently supplement potassium 7. Hope to discharge with home oxygen later today, once evaluated by Dr. Evette Georges M ZimmermanPA-C 06/16/2017,7:24 AM  Patient examined and CXR reviewed Ready for DC today Instructions reviewed with patient  patient examined and medical record reviewed,agree with above note. Tharon Aquas Trigt III 06/16/2017

## 2017-06-17 ENCOUNTER — Encounter: Payer: Self-pay | Admitting: *Deleted

## 2017-06-17 ENCOUNTER — Other Ambulatory Visit: Payer: Self-pay | Admitting: *Deleted

## 2017-06-17 DIAGNOSIS — N184 Chronic kidney disease, stage 4 (severe): Secondary | ICD-10-CM | POA: Diagnosis not present

## 2017-06-17 DIAGNOSIS — D631 Anemia in chronic kidney disease: Secondary | ICD-10-CM | POA: Diagnosis not present

## 2017-06-17 DIAGNOSIS — Z48812 Encounter for surgical aftercare following surgery on the circulatory system: Secondary | ICD-10-CM | POA: Diagnosis not present

## 2017-06-17 DIAGNOSIS — I130 Hypertensive heart and chronic kidney disease with heart failure and stage 1 through stage 4 chronic kidney disease, or unspecified chronic kidney disease: Secondary | ICD-10-CM | POA: Diagnosis not present

## 2017-06-17 DIAGNOSIS — I5030 Unspecified diastolic (congestive) heart failure: Secondary | ICD-10-CM | POA: Diagnosis not present

## 2017-06-17 NOTE — Patient Outreach (Signed)
[facility]) Care Management [place] Telephone Outreach  06/17/2017  [name] July 03, 1945 [id number]  Successful outgoing telephone outreach to [name], 73 y/o female referred to [place] after recent hospital visit February 14-June 16, 2017 for shortness of breath with hypoxia and pulmonary edema; patient noted to have aortic insufficiency and subacute dissection of aortic root; surgical AVR and repair of type A chronic ascending thoracic aortic dissection was completed on 05/30/17.  Patient has history including, but not limited to, CKD; HTN; d CHF; chronic pericardial effusion secondary to CHF, and aortic insufficiency.  Patient was discharged home with home health services through [place] in place for RN/ PT/ OT.  HIPAA/ identity verified with patient during phone call today.  [place] services discussed with patient, and patient provides verbal consent for [place] involvement in her care post-hospital discharge.  Note:  Patient's PCP practice completes transition of care outreach; verified by [facility] Liaison at the time of patient's discharge.  Today, patient reports that she "is doing fine," after hospital discharge; states that she "isn't having any problems."  Patient denies pain today, recent/ new falls post-hospital discharge; patient sounds to be in no obvious/ apparent distress throughout entirety of today's phone call.  Patient further reports:  Medications: -- Has all medicationsand takes as prescribed;denies questions about current medications.  -- attempted to review medications with patient around post-hospital discharge changes; this was partially completed; however, during phone call, patient abruptly ended phone call in middle of medication review, stating that home health services had arrived; briefly discussed with patient that we would resume medication review at time of next phone call, and she is  agreeable to this -- although patient verbally reported that she had all of medications and understood the post-hospital discharge changes, during our partial review of medications today, patient was unable to definitively verbalize all of the post-discharge changes -- reports no "regualr" issues around swallowing medications; states that if she has to take a "big pill," she "sometimes uses applesauce to help." -- self-manage medications and reports takes medications "righ tout of" the prescription boxes/ does not use pill planner box  Home health [facility]) services: -- [place] services for PT, OT, RN in place through [place] -- confirms that [facility] services have begun; at end of phone conversation, reports home health PT "just arrived."  Provider appointments: -- All upcoming provider appointments were reviewed with patient today; noted that patient does not currently have scheduled PCP post-hospital discharge follow up appointment, and I encouraged her to make this appointment promptly; patient agrees to do so -- reports she is planning to attend all scheduled provider appointments  Safety/ Mobility/ Falls: -- denies new/ recent falls -- assistive devices: uses walker "sometimes," states that she "is walking good." -- general fall risks/ prevention education discussed with patient today  Holiday representative needs: -- currently denies community resource needs, stating supportive family members that assist with care needs as indicated -- family provides transportation for patient to all provider appointments, errands, etc  Advanced Directive (AD) Planning:   --reports does not currently have exisisting AD in place, and she adamantly declines having additional information/ education around Advanced directive planning today.  Patient denies further issues, concerns, or problems today, and abruptly ended our phone conversation mid-way through medication review in progress, stating that home health PT  services were at her door.  I confirmed that patient has my direct phone number, the main [facility] CM  office phone number, and the [facility] CM 24-hour nurse advice phone number should issues arise prior to next scheduled [place] outreach.  Encouraged patient to contact me directly if needs, questions, issues, or concerns arise prior to next scheduled outreach; patient agreed to do so.   [facility] CM Care Plan Problem One     Most Recent Value  Care Plan Problem One  Risk for hospital re-admission related to/ as evidenced by recent extended hospital admission February 14-June 16, 2017   Role Documenting the Problem One  Care Management [place] for Problem One  Active  [facility] Long Term Goal   Over the next 31 days, patient will not experience hospital readmission, as evidenced by patient reporting and review of EMR during [facility] RN CCM outreach  [place] Term Goal Start Date  06/17/17  Interventions for Problem One Long Term Goal  Discussed with patient current clinical status post-hospital discharge,  partially reviewed discharge instructions and medications with patient,  discussed need for patient to promptly schedule hospital discharge follow up appointment with PCP,  [place] CM program initiated  [facility] CM Short Term Goal #1   Over the next 30 days, patient will actively participate in home health services as ordered post-hospital discharge  [facility] CM Short Term Goal #1 Start Date  06/17/17  Interventions for Short Term Goal #1  Discussed current home health services in place,  encouraged patient to actively participate in home health services,  initiated conversation with patient to explain difference between home health services and [facility] CM services     I appreciate the opportunity to participate in Floye's care,  [name], RN, BSN, [employer] Coordinator [facility] Care Management  [phone]

## 2017-06-18 ENCOUNTER — Telehealth: Payer: Self-pay | Admitting: Internal Medicine

## 2017-06-18 ENCOUNTER — Encounter: Payer: Self-pay | Admitting: *Deleted

## 2017-06-18 ENCOUNTER — Telehealth: Payer: Self-pay | Admitting: *Deleted

## 2017-06-18 ENCOUNTER — Telehealth (HOSPITAL_COMMUNITY): Payer: Self-pay

## 2017-06-18 DIAGNOSIS — D631 Anemia in chronic kidney disease: Secondary | ICD-10-CM | POA: Diagnosis not present

## 2017-06-18 DIAGNOSIS — I130 Hypertensive heart and chronic kidney disease with heart failure and stage 1 through stage 4 chronic kidney disease, or unspecified chronic kidney disease: Secondary | ICD-10-CM | POA: Diagnosis not present

## 2017-06-18 DIAGNOSIS — N184 Chronic kidney disease, stage 4 (severe): Secondary | ICD-10-CM | POA: Diagnosis not present

## 2017-06-18 DIAGNOSIS — R0602 Shortness of breath: Secondary | ICD-10-CM | POA: Diagnosis not present

## 2017-06-18 DIAGNOSIS — Z48812 Encounter for surgical aftercare following surgery on the circulatory system: Secondary | ICD-10-CM | POA: Diagnosis not present

## 2017-06-18 DIAGNOSIS — I5030 Unspecified diastolic (congestive) heart failure: Secondary | ICD-10-CM | POA: Diagnosis not present

## 2017-06-18 NOTE — Telephone Encounter (Signed)
Transition Care Management Follow-up Telephone Call   Date discharged? 06/16/17   How have you been since you were released from the hospital? Pt states she is doing alright.. Having this productive cough.    Do you understand why you were in the hospital? YES   Do you understand the discharge instructions? YES   Where were you discharged to? Home   Items Reviewed:  Medications reviewed: YES, she also states the PT that came out yesterday also went through her meds.. Everything that she is not taking anymore is in a bag. Inform pt when she come in for her appt to bring all the meds that she is taking that way we can update her med list as well  Allergies reviewed: YES  Dietary changes reviewed: YES, heart healthy  Referrals reviewed: YES, pt states she will be seeing her cardiologist next week, still need to make appt w/her kidney Dr.    Etheleen Mayhew Questionnaire:   Activities of Daily Living (ADLs):   She states she are independent in the following: bathing and hygiene, feeding, continence, grooming, toileting and dressing States they require assistance with the following: ambulation   Any transportation issues/concerns?: NO   Any patient concerns? NO   Confirmed importance and date/time of follow-up visits scheduled YES, appt 06/24/17  Provider Appointment booked with Dr. Sharlet Salina  Confirmed with patient if condition begins to worsen call PCP or go to the ER.  Patient was given the office number and encouraged to call back with question or concerns.  : YES

## 2017-06-18 NOTE — Telephone Encounter (Signed)
Fine

## 2017-06-18 NOTE — Telephone Encounter (Signed)
Copied from [place] [id number]. Topic: Quick Communication - See Telephone Encounter >> Jun 18, 2017  9:00 AM Ether Griffins B wrote: CRM for notification. See Telephone encounter for:  Olena Mater calling needing verbal orders for home health PT 2x 3 weeks 1x 2 weeks. Call back number [phone].  06/18/17.

## 2017-06-18 NOTE — Telephone Encounter (Signed)
Patients insurance is active and benefits verified through [facility] - $20.00 co-pay, no deductible, out of pocket amount of $4,400/$484.21 has been met, no co-insurance, and no pre-authorization is required. Passport/reference [id number]  Will contact patient to see if interested in CR. If interested, patient will need to complete follow up appt. Once completed, patient will be contacted for scheduling upon review by the RN Navigator.

## 2017-06-18 NOTE — Telephone Encounter (Signed)
Called to see if patient was interested in CR - Patient stated she is not interested in at the moment but would like for me to give her a call back. I explained to patient our scheduling process and we would be giving her a call after she completes her follow up appt. She was okay with that. Went over insurance with patient and patient verbally stated she understands what she would be responsible for.

## 2017-06-18 NOTE — Telephone Encounter (Signed)
Verbals given  

## 2017-06-20 ENCOUNTER — Encounter: Payer: Self-pay | Admitting: *Deleted

## 2017-06-20 ENCOUNTER — Other Ambulatory Visit: Payer: Self-pay | Admitting: *Deleted

## 2017-06-20 DIAGNOSIS — D631 Anemia in chronic kidney disease: Secondary | ICD-10-CM | POA: Diagnosis not present

## 2017-06-20 DIAGNOSIS — N184 Chronic kidney disease, stage 4 (severe): Secondary | ICD-10-CM | POA: Diagnosis not present

## 2017-06-20 DIAGNOSIS — Z48812 Encounter for surgical aftercare following surgery on the circulatory system: Secondary | ICD-10-CM | POA: Diagnosis not present

## 2017-06-20 DIAGNOSIS — I5030 Unspecified diastolic (congestive) heart failure: Secondary | ICD-10-CM | POA: Diagnosis not present

## 2017-06-20 DIAGNOSIS — I130 Hypertensive heart and chronic kidney disease with heart failure and stage 1 through stage 4 chronic kidney disease, or unspecified chronic kidney disease: Secondary | ICD-10-CM | POA: Diagnosis not present

## 2017-06-20 NOTE — Patient Outreach (Signed)
[facility]) Care Management [place] Telephone Outreach  06/20/2017  [name] 08-Nov-1945 [id number]  Successful outgoing telephone outreach to [name], 72 y/o female referred to [place] after recent hospital visit February 14-June 16, 2017 for shortness of breath with hypoxia and pulmonary edema; patient noted to have aortic insufficiency and subacute dissection of aortic root; surgical AVR and repair of type A chronic ascending thoracic aortic dissection was completed on 05/30/17.  Patient has history including, but not limited to, CKD; HTN; d CHF; chronic pericardial effusion secondary to CHF, and aortic insufficiency.  Patient was discharged home with home health services through [place] in place for RN/ PT/ OT.  Transition of care after hospitalization completed through patient's PCP office.  HIPAA/ identity verified with patient during phone call today.    Today, patient again reports that she "is doing fine, much much better" after hospital discharge.  Patient denies pain today, recent/ new falls post-hospital discharge; patient sounds to be in no obvious/ apparent distress throughout entirety of today's phone call.  Patient further reports:  Medications: -- Has all medicationsand takes as prescribed;denies questions about current medications.  - during phone call on 06/18/17, attempted to review patient's medications, but was not completed, as patient had home health team arrive during our phone call; patient reports today that the home health nurse came and "spent 2 and a half hours" with her, and specifically went over all medications post-hospital discharge; states that home health RN "threw out all of the old medicines, filled my pill box, and got everything straightened out." -- patient was agreeable to review of medications today: Patient was recently discharged from hospital and all medications were thoroughly reviewed with patient today,  and she verbalizes a good/ accurate general understanding of the purpose, scheduling, and dosing of her medications.  Home health [facility]) services: -- [place] services for PT, OT, RN actively in place through [place] -- confirms that [facility] services have begun; confirms that she has telephone number for home health team -- confirms that she has received DME (oxygenator) from [place]  Provider appointments: -- All upcoming provider appointments were reviewed with patient today; previously noted that patient did not have scheduled PCP post-hospital discharge appointment; noted today, that this office visit has been scheduled for next week; positive reinforcement provided to patient for scheduling this appointment promptly -- reports she is planning to attend all scheduled provider appointments; states that her husband still drives and will provide transportation  Safety/ Mobility/ Falls: -- denies new/ recent falls post-hospital discharge -- assistive devices: uses walker and/ or cane "sometimes," again states that she "is walking good," but acknowledges that home health PT has encouraged her to use assistive devices more regularly for fall prevention, which patient reports she 'is trying to do." -- general fall risks/ prevention education reiterated with patient today  Social/ Community Resource needs: -- again denies community resource needs, stating supportive family members that assist with care needs as indicated -- family (primarily husband) provides transportation for patient to all provider appointments, errands, etc  Patient denies further issues, concerns, or problems today. I re-provided and confirmed that patient hasmy direct phone number, the main [facility] CM office phone number, and the [facility] CM 24-hour nurse advice phone number should issues arise prior to next scheduled [place] outreach by phone next week.  Discussed with patient scheduling [facility] RN CM home visit, and  patient stated that she prefers to schedule "next week,"  after all of her currently scheduled provider appointments.  Encouraged patient to contact me directly if needs, questions, issues, or concerns arise prior to next scheduled outreach; patient agreed to do so.  Plan:  Patient will take all medications as prescribed and will attend all scheduled provider appointments  Patient will actively participate in home health services as ordered post-hospital discharge  Patient will promptly notify care providers for any new concerns, issues, or problems that arise  [place] to continue with scheduled telephone call next week  [facility] CM Care Plan Problem One     Most Recent Value  Care Plan Problem One  Risk for hospital re-admission related to/ as evidenced by recent extended hospital admission February 14-June 16, 2017   Role Documenting the Problem One  Care Management [place] for Problem One  Active  [facility] Long Term Goal   Over the next 31 days, patient will not experience hospital readmission, as evidenced by patient reporting and review of EMR during [facility] RN CCM outreach  [place] Long Term Goal Start Date  06/17/17  Interventions for Problem One Long Term Goal  Discussed with patient current clinical status post-hospital discharge,  completed medication review that was started during last telephone outreach,  encouraged medication adherence to prescribed medications.  Discussed upcoming scheduled provider appointments with patient,  provided positive reinforcement for patient scheduling post-hospital PCP office visit  [facility] CM Short Term Goal #1   Over the next 30 days, patient will actively participate in home health services as ordered post-hospital discharge  [facility] CM Short Term Goal #1 Start Date  06/17/17  Interventions for Short Term Goal #1  Discussed current home health services in place,  again encouraged patient to actively participate in home health services,   confirmed that patient has received DME for home O2 and is using home O2 as prescribed  [facility] CM Short Term Goal #2   Over the next 7 days, patient will attend all scheduled provider appointments, as evidenced by patient reporting and review of EMR during [facility] RN CCM outreach  [facility] CM Short Term Goal #2 Start Date  06/20/17  Interventions for Short Term Goal #2  Reviewed all upcoming scheduled provider appointments with patient and confirmed that she has transportation to scheduled appointments     [name], RN, BSN, [place] Care Management  [phone]

## 2017-06-22 NOTE — Progress Notes (Deleted)
Cardiology Office Note   Date:  06/22/2017   ID:  [name], DOB [birth year], MRN [id number]  PCP:  Hoyt Koch, MD  Cardiologist: Dr. Percival Spanish No chief complaint on file.    History of Present Illness: [name] is a 72 y.o. female who presents for ongoing assessment and management of chronic pericardial effusion (since 2018-becoming progressively larger), diastolic heart failure, EF of 60% to 65% with moderate aortic insufficiency, mitral regurgitation, with grade 1 diastolic dysfunction, per echo 04/20/2017; left bundle branch block, CAD, chronic bilateral lower extremity edema, and hypertension.  Other history includes chronic kidney disease stage III.  The patient was last seen by Dr. Percival Spanish on 05/28/2017 for worsening shortness of breath and large pericardial effusion.  The patient underwent a TEE on 05/28/2017, this did reveal a large circumferential pericardial effusion, with a type a dissection of the aorta extending from the right coronary cusp to the descending or aorta, there appears to be thrombus in the effusion, severe aortic regurgitation with mild mitral regurg, no LA/LAA thrombus or mass.  The patient was referred to [facility] for surgical repair of aortic root and valve replacement.  This was completed by Dr. Nils Pyle, 06/12/2017.  He replaced the ascending aortic aneurysm and repaired chronic root dissection along with aortic valve replacement.  She was also noted to have a large pericardial effusion and interventional radiology completed a right thoracentesis removing 550 mL, and followed with a left thoracentesis the following day, yielding 300 mL of pleural fluid.  She was discharged on 06/16/2017, she was doing well, she continued to have some mild lower extremity edema and remained on Lasix with need to keep feet elevated and reduction of salt and fluid.  Creatinine was slightly elevated but was found to be 2.97, considerably less than admission of greater than  3.2.    Past Medical History:  Diagnosis Date  . Acute pancreatitis 02/04/2017   [name] 02/04/2017  . Adenomatous colon polyp 01/2002  . Anemia, chronic disease    /notes 02/04/2017  . Aortic valve disease    AI/AS  . Arthritis    "lower back, knees" (02/04/2017)  . CHF (congestive heart failure) ([place])   . CKD (chronic kidney disease), stage III ([place])   . Diverticulosis of colon   . DJD (degenerative joint disease)   . Frequent UTI   . Hemorrhoids   . Hypertension   . Lumbar back pain   . Neuropathy   . Obesity   . Pericardial effusion     in a patient with Diastolic heart  failure and Pericardial effusion  known since last 2 D echo in 11/2016 /notes 02/04/2017  . PONV (postoperative nausea and vomiting)   . Pulmonary HTN ([place])   . Renal cyst   . Venous insufficiency   . Vitamin D deficiency     Past Surgical History:  Procedure Laterality Date  . ABDOMINAL HYSTERECTOMY     "partial"  . AORTIC VALVE REPLACEMENT N/A 05/30/2017   Procedure: AORTIC VALVE REPLACEMENT (AVR);  Surgeon: Prescott Gum, Collier Salina, MD;  Location: [facility];  Service: Vascular;  Laterality: N/A;  . COLONOSCOPY W/ BIOPSIES AND POLYPECTOMY     "bx was ok"  . IR THORACENTESIS ASP PLEURAL SPACE W/IMG GUIDE  06/11/2017  . IR THORACENTESIS ASP PLEURAL SPACE W/IMG GUIDE  06/12/2017  . REPAIR OF ACUTE ASCENDING THORACIC AORTIC DISSECTION N/A 05/30/2017   Procedure: REPLACEMENT OF ASCENDING AORTIC ANEURYSM AND REPAIRED CHRONIC ROOT DISSECTION WITH CIRC ARREST;  Surgeon: Lucianne Lei  Donney Rankins, MD;  Location: [facility] OR;  Service: Vascular;  Laterality: N/A;  . TEE WITHOUT CARDIOVERSION N/A 05/28/2017   Procedure: TRANSESOPHAGEAL ECHOCARDIOGRAM (TEE);  Surgeon: Skeet Latch, MD;  Location: [place];  Service: Cardiovascular;  Laterality: N/A;  . TEE WITHOUT CARDIOVERSION N/A 05/30/2017   Procedure: TRANSESOPHAGEAL ECHOCARDIOGRAM (TEE);  Surgeon: Prescott Gum, Collier Salina, MD;  Location: [place];  Service: Open Heart Surgery;  Laterality:  N/A;     Current Outpatient Medications  Medication Sig Dispense Refill  . aspirin EC 325 MG EC tablet Take 1 tablet (325 mg total) by mouth daily. 30 tablet 0  . calcitRIOL (ROCALTROL) 0.25 MCG capsule Take 1 capsule (0.25 mcg total) by mouth every other day. Please take every Monday, Wednesday, Friday 30 capsule 1  . calcium carbonate (TUMS - DOSED IN MG ELEMENTAL CALCIUM) 500 MG chewable tablet Chew 2 tablets by mouth daily as needed for indigestion or heartburn.    . carvedilol (COREG) 3.125 MG tablet Take 1 tablet (3.125 mg total) by mouth 2 (two) times daily with a meal. 60 tablet 1  . FAMOTIDINE PO Take 1 tablet by mouth 2 (two) times daily as needed (gas).    . fluticasone (FLONASE) 50 MCG/ACT nasal spray Place 2 sprays daily into both nostrils. 16 g 2  . furosemide (LASIX) 80 MG tablet Take 2 tablets (160 mg total) by mouth 2 (two) times daily. 90 tablet 1  . gabapentin (NEURONTIN) 300 MG capsule TAKE 1 CAPSULE(300 MG) BY MOUTH THREE TIMES DAILY AS NEEDED FOR PAIN 90 capsule 0  . hydrALAZINE (APRESOLINE) 10 MG tablet Take 1 tablet (10 mg total) by mouth 2 (two) times daily. 60 tablet 1  . hydrocortisone (ANUSOL-HC) 2.5 % rectal cream Place 1 application rectally as needed. (Patient taking differently: Place 1 application rectally daily as needed for hemorrhoids. ) 30 g 0  . ondansetron (ZOFRAN) 4 MG tablet Take 1 tablet (4 mg total) by mouth every 8 (eight) hours as needed for nausea or vomiting. 20 tablet 0  . pantoprazole (PROTONIX) 40 MG tablet Take 1 tablet (40 mg total) daily by mouth. 30 tablet 0  . Propylene Glycol (SYSTANE BALANCE) 0.6 % SOLN Place 1 drop into both eyes daily as needed (for dry eyes).    . traMADol (ULTRAM) 50 MG tablet Take 1 tablet (50 mg total) by mouth every 12 (twelve) hours as needed for severe pain. 28 tablet 0  . triamcinolone ointment (KENALOG) 0.5 % Apply 1 application topically daily as needed (itching).     No current facility-administered  medications for this visit.     Allergies:   Minoxidil and Naproxen    Social History:  The patient  reports that she quit smoking about 35 years ago. Her smoking use included cigarettes. She has a 6.00 pack-year smoking history. she has never used smokeless tobacco. She reports that she does not drink alcohol or use drugs.   Family History:  The patient's family history includes Colon polyps in her mother; Diabetes in her mother; Hypertension in her brother, mother, and sister.    ROS: All other systems are reviewed and negative. Unless otherwise mentioned in H&P    PHYSICAL EXAM: VS:  There were no vitals taken for this visit. , BMI There is no height or weight on file to calculate BMI. GEN: Well nourished, well developed, in no acute distress  HEENT: normal  Neck: no JVD, carotid bruits, or masses Cardiac: ***RRR; no murmurs, rubs, or gallops,no edema  Respiratory:  clear to auscultation bilaterally, normal work of breathing GI: soft, nontender, nondistended, + BS MS: no deformity or atrophy  Skin: warm and dry, no rash Neuro:  Strength and sensation are intact Psych: euthymic mood, full affect   EKG:  EKG {ACTION; IS/IS [id number]:[id number]} ordered today. The ekg ordered today demonstrates ***   Recent Labs: 11/27/2016: Pro B Natriuretic peptide (BNP) 202.0 05/22/2017: B Natriuretic Peptide 800.0 05/29/2017: TSH 0.703 05/31/2017: Magnesium 2.2 06/03/2017: ALT 31 06/12/2017: Hemoglobin 8.7; Platelets 314 06/16/2017: BUN 94; Creatinine, Ser 2.93; Potassium 3.5; Sodium 138    Lipid Panel    Component Value Date/Time   CHOL 141 02/04/2017 2101   TRIG 78 02/04/2017 2101   HDL 49 02/04/2017 2101   CHOLHDL 2.9 02/04/2017 2101   VLDL 16 02/04/2017 2101   [place] 76 02/04/2017 2101      Wt Readings from Last 3 Encounters:  06/16/17 191 lb 12.8 oz (87 kg)  05/14/17 197 lb (89.4 kg)  04/29/17 196 lb (88.9 kg)      Other studies Reviewed: Additional studies/ records that were  reviewed today include: ***. Review of the above records demonstrates: ***   ASSESSMENT AND PLAN:  1.  ***   Current medicines are reviewed at length with the patient today.    Labs/ tests ordered today include: *** Phill Myron. [place] Pugh, ANP, AACC   06/22/2017 7:19 PM    [facility] [address], [place], [place] [address] Phone: [phone]; Fax: [phone]

## 2017-06-23 ENCOUNTER — Ambulatory Visit: Payer: Medicare Other | Admitting: Adult Health

## 2017-06-24 ENCOUNTER — Inpatient Hospital Stay: Payer: Medicare Other | Admitting: Internal Medicine

## 2017-06-24 ENCOUNTER — Ambulatory Visit: Payer: Self-pay

## 2017-06-24 NOTE — Telephone Encounter (Signed)
Patient called in and says "I can't walk. I stand up on the side of the bed and can't move my legs to walk. That's why I didn't come to the appointment yesterday, because my husband can't get me to the car. What do I need to do?" I advised call EMS to come get her, if she is not able to walk. She says "I don't want to do that. I will see how I am tomorrow and call you back."    Reason for Disposition . General information question, no triage required and triager able to answer question  Answer Assessment - Initial Assessment Questions 1. REASON FOR CALL or QUESTION: "What is your reason for calling today?" or "How can I best help you?" or "What question do you have that I can help answer?"     I can't walk  Protocols used: INFORMATION ONLY CALL-A-AH

## 2017-06-25 ENCOUNTER — Other Ambulatory Visit: Payer: Self-pay

## 2017-06-25 ENCOUNTER — Emergency Department (HOSPITAL_COMMUNITY): Payer: Medicare Other

## 2017-06-25 ENCOUNTER — Inpatient Hospital Stay (HOSPITAL_COMMUNITY)
Admission: EM | Admit: 2017-06-25 | Discharge: 2017-06-30 | DRG: 872 | Disposition: A | Discharge status: Skilled Nursing Facility | Payer: Medicare Other | Attending: Family Medicine | Admitting: Family Medicine

## 2017-06-25 ENCOUNTER — Ambulatory Visit: Payer: Self-pay

## 2017-06-25 ENCOUNTER — Encounter (HOSPITAL_COMMUNITY): Payer: Self-pay | Admitting: Emergency Medicine

## 2017-06-25 DIAGNOSIS — E860 Dehydration: Secondary | ICD-10-CM | POA: Diagnosis not present

## 2017-06-25 DIAGNOSIS — R531 Weakness: Secondary | ICD-10-CM | POA: Diagnosis not present

## 2017-06-25 DIAGNOSIS — Z87891 Personal history of nicotine dependence: Secondary | ICD-10-CM

## 2017-06-25 DIAGNOSIS — R0682 Tachypnea, not elsewhere classified: Secondary | ICD-10-CM | POA: Diagnosis present

## 2017-06-25 DIAGNOSIS — I5033 Acute on chronic diastolic (congestive) heart failure: Secondary | ICD-10-CM | POA: Diagnosis present

## 2017-06-25 DIAGNOSIS — Z833 Family history of diabetes mellitus: Secondary | ICD-10-CM

## 2017-06-25 DIAGNOSIS — R000 Tachycardia, unspecified: Secondary | ICD-10-CM | POA: Diagnosis not present

## 2017-06-25 DIAGNOSIS — M6281 Muscle weakness (generalized): Secondary | ICD-10-CM | POA: Diagnosis not present

## 2017-06-25 DIAGNOSIS — R29898 Other symptoms and signs involving the musculoskeletal system: Secondary | ICD-10-CM | POA: Diagnosis present

## 2017-06-25 DIAGNOSIS — N179 Acute kidney failure, unspecified: Secondary | ICD-10-CM | POA: Diagnosis present

## 2017-06-25 DIAGNOSIS — A4159 Other Gram-negative sepsis: Principal | ICD-10-CM | POA: Diagnosis present

## 2017-06-25 DIAGNOSIS — Z8371 Family history of colonic polyps: Secondary | ICD-10-CM | POA: Diagnosis not present

## 2017-06-25 DIAGNOSIS — R05 Cough: Secondary | ICD-10-CM | POA: Diagnosis not present

## 2017-06-25 DIAGNOSIS — K219 Gastro-esophageal reflux disease without esophagitis: Secondary | ICD-10-CM | POA: Diagnosis not present

## 2017-06-25 DIAGNOSIS — N390 Urinary tract infection, site not specified: Secondary | ICD-10-CM | POA: Diagnosis present

## 2017-06-25 DIAGNOSIS — R404 Transient alteration of awareness: Secondary | ICD-10-CM | POA: Diagnosis not present

## 2017-06-25 DIAGNOSIS — D539 Nutritional anemia, unspecified: Secondary | ICD-10-CM | POA: Diagnosis not present

## 2017-06-25 DIAGNOSIS — Z8249 Family history of ischemic heart disease and other diseases of the circulatory system: Secondary | ICD-10-CM

## 2017-06-25 DIAGNOSIS — Z48812 Encounter for surgical aftercare following surgery on the circulatory system: Secondary | ICD-10-CM | POA: Diagnosis not present

## 2017-06-25 DIAGNOSIS — I10 Essential (primary) hypertension: Secondary | ICD-10-CM | POA: Diagnosis present

## 2017-06-25 DIAGNOSIS — I5032 Chronic diastolic (congestive) heart failure: Secondary | ICD-10-CM | POA: Diagnosis present

## 2017-06-25 DIAGNOSIS — R6520 Severe sepsis without septic shock: Secondary | ICD-10-CM | POA: Diagnosis not present

## 2017-06-25 DIAGNOSIS — A419 Sepsis, unspecified organism: Secondary | ICD-10-CM | POA: Diagnosis not present

## 2017-06-25 DIAGNOSIS — Z952 Presence of prosthetic heart valve: Secondary | ICD-10-CM | POA: Diagnosis not present

## 2017-06-25 DIAGNOSIS — B9689 Other specified bacterial agents as the cause of diseases classified elsewhere: Secondary | ICD-10-CM | POA: Diagnosis present

## 2017-06-25 DIAGNOSIS — I272 Pulmonary hypertension, unspecified: Secondary | ICD-10-CM | POA: Diagnosis not present

## 2017-06-25 DIAGNOSIS — Z87440 Personal history of urinary (tract) infections: Secondary | ICD-10-CM

## 2017-06-25 DIAGNOSIS — I130 Hypertensive heart and chronic kidney disease with heart failure and stage 1 through stage 4 chronic kidney disease, or unspecified chronic kidney disease: Secondary | ICD-10-CM | POA: Diagnosis present

## 2017-06-25 DIAGNOSIS — I5030 Unspecified diastolic (congestive) heart failure: Secondary | ICD-10-CM | POA: Diagnosis not present

## 2017-06-25 DIAGNOSIS — R059 Cough, unspecified: Secondary | ICD-10-CM | POA: Diagnosis present

## 2017-06-25 DIAGNOSIS — R509 Fever, unspecified: Secondary | ICD-10-CM

## 2017-06-25 DIAGNOSIS — I447 Left bundle-branch block, unspecified: Secondary | ICD-10-CM | POA: Diagnosis present

## 2017-06-25 DIAGNOSIS — D631 Anemia in chronic kidney disease: Secondary | ICD-10-CM | POA: Diagnosis not present

## 2017-06-25 DIAGNOSIS — Z953 Presence of xenogenic heart valve: Secondary | ICD-10-CM

## 2017-06-25 DIAGNOSIS — N184 Chronic kidney disease, stage 4 (severe): Secondary | ICD-10-CM | POA: Diagnosis not present

## 2017-06-25 DIAGNOSIS — R0602 Shortness of breath: Secondary | ICD-10-CM | POA: Diagnosis not present

## 2017-06-25 LAB — COMPREHENSIVE METABOLIC PANEL
ALBUMIN: 2.5 g/dL — AB (ref 3.5–5.0)
ALK PHOS: 76 U/L (ref 38–126)
ALT: 34 U/L (ref 14–54)
AST: 54 U/L — AB (ref 15–41)
Anion gap: 12 (ref 5–15)
BILIRUBIN TOTAL: 0.5 mg/dL (ref 0.3–1.2)
BUN: 99 mg/dL — AB (ref 6–20)
CALCIUM: 9.7 mg/dL (ref 8.9–10.3)
CO2: 39 mmol/L — AB (ref 22–32)
Chloride: 93 mmol/L — ABNORMAL LOW (ref 101–111)
Creatinine, Ser: 4.27 mg/dL — ABNORMAL HIGH (ref 0.44–1.00)
GFR calc Af Amer: 11 mL/min — ABNORMAL LOW (ref >60)
GFR calc non Af Amer: 10 mL/min — ABNORMAL LOW (ref >60)
GLUCOSE: 100 mg/dL — AB (ref 65–99)
Potassium: 3.9 mmol/L (ref 3.5–5.1)
Sodium: 144 mmol/L (ref 135–145)
TOTAL PROTEIN: 7.7 g/dL (ref 6.5–8.1)

## 2017-06-25 LAB — CBC WITH DIFFERENTIAL/PLATELET
BASOS ABS: 0 10*3/uL (ref 0.0–0.1)
BASOS PCT: 0 %
Eosinophils Absolute: 0.1 10*3/uL (ref 0.0–0.7)
Eosinophils Relative: 1 %
HEMATOCRIT: 27 % — AB (ref 36.0–46.0)
HEMOGLOBIN: 8 g/dL — AB (ref 12.0–15.0)
Lymphocytes Relative: 11 %
Lymphs Abs: 1.3 10*3/uL (ref 0.7–4.0)
MCH: 30.8 pg (ref 26.0–34.0)
MCHC: 29.6 g/dL — ABNORMAL LOW (ref 30.0–36.0)
MCV: 103.8 fL — ABNORMAL HIGH (ref 78.0–100.0)
Monocytes Absolute: 0.7 10*3/uL (ref 0.1–1.0)
Monocytes Relative: 6 %
NEUTROS ABS: 9.7 10*3/uL — AB (ref 1.7–7.7)
NEUTROS PCT: 82 %
Platelets: 289 10*3/uL (ref 150–400)
RBC: 2.6 MIL/uL — ABNORMAL LOW (ref 3.87–5.11)
RDW: 16.9 % — ABNORMAL HIGH (ref 11.5–15.5)
WBC: 11.7 10*3/uL — AB (ref 4.0–10.5)

## 2017-06-25 LAB — URINALYSIS, ROUTINE W REFLEX MICROSCOPIC
Bilirubin Urine: NEGATIVE
Glucose, UA: NEGATIVE mg/dL
HGB URINE DIPSTICK: NEGATIVE
KETONES UR: NEGATIVE mg/dL
Nitrite: NEGATIVE
PROTEIN: NEGATIVE mg/dL
Specific Gravity, Urine: 1.012 (ref 1.005–1.030)
pH: 5 (ref 5.0–8.0)

## 2017-06-25 LAB — I-STAT CG4 LACTIC ACID, ED: Lactic Acid, Venous: 1.08 mmol/L (ref 0.5–1.9)

## 2017-06-25 MED ORDER — VANCOMYCIN HCL IN DEXTROSE 1-5 GM/200ML-% IV SOLN: 1000.0000 mg | Freq: Once | INTRAVENOUS | Status: DC

## 2017-06-25 MED ORDER — SODIUM CHLORIDE 0.9 % IV BOLUS (SEPSIS)
1000.0000 mL | Freq: Once | INTRAVENOUS | Status: AC
  Administered 2017-06-25: 1000 mL via INTRAVENOUS

## 2017-06-25 MED ORDER — VANCOMYCIN HCL 10 G IV SOLR
2000.0000 mg | Freq: Once | INTRAVENOUS | Status: AC
  Administered 2017-06-25: 2000 mg via INTRAVENOUS
  Filled 2017-06-25: qty 2000

## 2017-06-25 MED ORDER — ACETAMINOPHEN 325 MG PO TABS
650.0000 mg | ORAL_TABLET | Freq: Once | ORAL | Status: AC
  Administered 2017-06-25: 650 mg via ORAL
  Filled 2017-06-25: qty 2

## 2017-06-25 MED ORDER — PIPERACILLIN-TAZOBACTAM 3.375 G IVPB 30 MIN
3.3750 g | Freq: Once | INTRAVENOUS | Status: AC
  Administered 2017-06-25: 3.375 g via INTRAVENOUS
  Filled 2017-06-25: qty 50

## 2017-06-25 NOTE — ED Triage Notes (Signed)
Per [facility] pt coming from home lives with husband. Patient has home health coming to house, states Friday pt was able to ambulate with cane however today pt was unable to ambulate.

## 2017-06-25 NOTE — ED Provider Notes (Signed)
[facility] DEPT Provider Note   CSN: [id number] Arrival date & time: 06/25/17  1849     History   Chief Complaint Chief Complaint  Patient presents with  . Weakness    HPI Velina Drollinger is a 72 y.o. female.  The history is provided by the patient and medical records. No language interpreter was used.   Anyjah Roundtree is a 72 y.o. female  with a PMH as listed below including CHF, CKD, recent repair of thoracic aortic dissection on 06/01/17 who presents to the Emergency Department complaining of bilateral lower extremity weakness. PT came to her home on Friday. When he came to the house today, PT noticed decrease in her lower extremity strength. On Friday, she was able to walk with a cane / assistance. Today, she was not able to stand due to her weakness. She denies any back pain, chest pain, abdominal pain or shortness of breath. Per notes in chart, she endorsed numbness to left leg, but denies any numbness to me. Upon arrival to ED, noted to have temp of 101. She has intermittent dry cough, but states this has been present for several weeks without acute change. She was not aware that she had a fever until arrival to ED. she denies any urinary symptoms or known recent illnesses.  No medications taken prior to arrival for symptoms.  Past Medical History:  Diagnosis Date  . Acute pancreatitis 02/04/2017   [name] 02/04/2017  . Adenomatous colon polyp 01/2002  . Anemia, chronic disease    /notes 02/04/2017  . Aortic valve disease    AI/AS  . Arthritis    "lower back, knees" (02/04/2017)  . CHF (congestive heart failure) ([place])   . CKD (chronic kidney disease), stage III ([place])   . Diverticulosis of colon   . DJD (degenerative joint disease)   . Frequent UTI   . Hemorrhoids   . Hypertension   . Lumbar back pain   . Neuropathy   . Obesity   . Pericardial effusion     in a patient with Diastolic heart  failure and Pericardial effusion  known since last  2 D echo in 11/2016 /notes 02/04/2017  . PONV (postoperative nausea and vomiting)   . Pulmonary HTN ([place])   . Renal cyst   . Venous insufficiency   . Vitamin D deficiency     Patient Active Problem List   Diagnosis Date Noted  . S/P AVR 05/30/2017  . Ascending aortic dissection ([place])   . Hypertensive emergency 05/22/2017  . Acute on chronic diastolic CHF (congestive heart failure) ([place]) 04/19/2017  . Nonrheumatic aortic valve insufficiency 03/10/2017  . Pulmonary HTN ([place]) 03/10/2017  . Chronic midline posterior neck pain 02/11/2017  . AKI (acute kidney injury) ([place])   . Lobar pneumonia ([place])   . Acute respiratory failure with hypoxia ([place])   . Pericardial effusion 02/04/2017  . Abnormal bone marrow examination 01/24/2017  . Stenosis of right carotid artery 08/27/2016  . LBBB (left bundle branch block) 08/27/2016  . Left-sided tinnitus 08/16/2016  . Anemia in chronic kidney disease 11/02/2015  . CKD (chronic kidney disease) stage 4, GFR 15-29 ml/min (H[facility]) 04/26/2014  . Diastolic congestive heart failure ([place]) 04/26/2014  . Knee pain, bilateral 03/23/2012  . Murmur 02/24/2012  . Vitamin D deficiency 04/13/2008  . OBESITY 02/23/2008  . Radiculopathy 02/23/2008  . Essential hypertension 02/23/2008  . Venous (peripheral) insufficiency 02/23/2008    Past Surgical History:  Procedure Laterality Date  . ABDOMINAL HYSTERECTOMY     "  partial"  . AORTIC VALVE REPLACEMENT N/A 05/30/2017   Procedure: AORTIC VALVE REPLACEMENT (AVR);  Surgeon: Prescott Gum, Collier Salina, MD;  Location: [facility];  Service: Vascular;  Laterality: N/A;  . COLONOSCOPY W/ BIOPSIES AND POLYPECTOMY     "bx was ok"  . IR THORACENTESIS ASP PLEURAL SPACE W/IMG GUIDE  06/11/2017  . IR THORACENTESIS ASP PLEURAL SPACE W/IMG GUIDE  06/12/2017  . REPAIR OF ACUTE ASCENDING THORACIC AORTIC DISSECTION N/A 05/30/2017   Procedure: REPLACEMENT OF ASCENDING AORTIC ANEURYSM AND REPAIRED CHRONIC ROOT DISSECTION WITH CIRC ARREST;  Surgeon: Ivin Poot, MD;  Location: [facility];  Service: Vascular;  Laterality: N/A;  . TEE WITHOUT CARDIOVERSION N/A 05/28/2017   Procedure: TRANSESOPHAGEAL ECHOCARDIOGRAM (TEE);  Surgeon: Skeet Latch, MD;  Location: [facility];  Service: Cardiovascular;  Laterality: N/A;  . TEE WITHOUT CARDIOVERSION N/A 05/30/2017   Procedure: TRANSESOPHAGEAL ECHOCARDIOGRAM (TEE);  Surgeon: Prescott Gum, Collier Salina, MD;  Location: [facility];  Service: Open Heart Surgery;  Laterality: N/A;    OB History    No data available       Home Medications    Prior to Admission medications   Medication Sig Start Date End Date Taking? Authorizing Provider  carvedilol (COREG) 3.125 MG tablet Take 1 tablet (3.125 mg total) by mouth 2 (two) times daily with a meal. 06/16/17  Yes Lars Pinks M, PA-C  furosemide (LASIX) 80 MG tablet Take 2 tablets (160 mg total) by mouth 2 (two) times daily. 06/16/17  Yes Tacy Dura, Donielle M, PA-C  gabapentin (NEURONTIN) 300 MG capsule TAKE 1 CAPSULE(300 MG) BY MOUTH THREE TIMES DAILY AS NEEDED FOR PAIN 01/14/17  Yes Hoyt Koch, MD  hydrALAZINE (APRESOLINE) 10 MG tablet Take 1 tablet (10 mg total) by mouth 2 (two) times daily. 06/16/17  Yes Lars Pinks M, PA-C  aspirin EC 325 MG EC tablet Take 1 tablet (325 mg total) by mouth daily. 06/16/17   Nani Skillern, PA-C  calcitRIOL (ROCALTROL) 0.25 MCG capsule Take 1 capsule (0.25 mcg total) by mouth every other day. Please take every Monday, Wednesday, Friday 06/16/17   Nani Skillern, PA-C  calcium carbonate (TUMS - DOSED IN MG ELEMENTAL CALCIUM) 500 MG chewable tablet Chew 2 tablets by mouth daily as needed for indigestion or heartburn.    [provider]  FAMOTIDINE PO Take 1 tablet by mouth 2 (two) times daily as needed (gas).    [provider]  fluticasone (FLONASE) 50 MCG/ACT nasal spray Place 2 sprays daily into both nostrils. Patient taking differently: Place 2 sprays into both nostrils daily as  needed for allergies.  02/12/17   Debbe Odea, MD  hydrocortisone (ANUSOL-HC) 2.5 % rectal cream Place 1 application rectally as needed. Patient taking differently: Place 1 application rectally daily as needed for hemorrhoids.  05/24/13   Noralee Space, MD  ondansetron (ZOFRAN) 4 MG tablet Take 1 tablet (4 mg total) by mouth every 8 (eight) hours as needed for nausea or vomiting. 01/04/16   Hoyt Koch, MD  pantoprazole (PROTONIX) 40 MG tablet Take 1 tablet (40 mg total) daily by mouth. Patient taking differently: Take 40 mg by mouth 2 (two) times daily as needed (indigestion).  02/12/17   Debbe Odea, MD  Propylene Glycol (SYSTANE BALANCE) 0.6 % SOLN Place 1 drop into both eyes daily as needed (for dry eyes).    [provider]  traMADol (ULTRAM) 50 MG tablet Take 1 tablet (50 mg total) by mouth every 12 (twelve) hours as needed  for severe pain. 06/16/17   Nani Skillern, PA-C  triamcinolone ointment (KENALOG) 0.5 % Apply 1 application topically daily as needed (itching). 06/16/17   Nani Skillern, PA-C    Family History Family History  Problem Relation Age of Onset  . Diabetes Mother   . Colon polyps Mother   . Hypertension Mother   . Hypertension Brother   . Hypertension Sister   . Colon cancer Neg Hx     Social History Social History   Tobacco Use  . Smoking status: Former Smoker    Packs/day: 0.40    Years: 15.00    Pack years: 6.00    Types: Cigarettes    Last attempt to quit: 04/08/1982    Years since quitting: 35.2  . Smokeless tobacco: Never Used  Substance Use Topics  . Alcohol use: No  . Drug use: No     Allergies   Minoxidil and Naproxen   Review of Systems Review of Systems  Respiratory: Positive for cough. Negative for shortness of breath.   Neurological: Positive for weakness.  All other systems reviewed and are negative.    Physical Exam Updated Vital Signs BP 123/67   Pulse 97   Temp 99.5 F (37.5 C) (Oral)    Resp 20   Ht 5\' 6"  (1.676 m)   Wt 86.2 kg (190 lb)   SpO2 93%   BMI 30.67 kg/m   Physical Exam  Constitutional: She is oriented to person, place, and time. She appears well-developed and well-nourished. No distress.  HENT:  Head: Normocephalic and atraumatic.  Cardiovascular: Normal heart sounds.  No murmur heard. Tachycardic but regular.  Pulmonary/Chest: Effort normal. No respiratory distress. She has no wheezes.  Abdominal: Soft. She exhibits no distension. There is no tenderness.  Musculoskeletal:  Decreased strength to bilateral LE's. Sensation equal throughout LE's. No back tenderness. 2+ DP.  Neurological: She is alert and oriented to person, place, and time.  Hyporeflexia to bilateral LE's.  Skin: Skin is warm and dry.  Nursing note and vitals reviewed.   ED Treatments / Results  Labs (all labs ordered are listed, but only abnormal results are displayed) Labs Reviewed  CBC WITH DIFFERENTIAL/PLATELET - Abnormal; Notable for the following components:      Result Value   WBC 11.7 (*)    RBC 2.60 (*)    Hemoglobin 8.0 (*)    HCT 27.0 (*)    MCV 103.8 (*)    MCHC 29.6 (*)    RDW 16.9 (*)    Neutro Abs 9.7 (*)    All other components within normal limits  COMPREHENSIVE METABOLIC PANEL - Abnormal; Notable for the following components:   Chloride 93 (*)    CO2 39 (*)    Glucose, Bld 100 (*)    BUN 99 (*)    Creatinine, Ser 4.27 (*)    Albumin 2.5 (*)    AST 54 (*)    GFR calc non Af Amer 10 (*)    GFR calc Af Amer 11 (*)    All other components within normal limits  URINALYSIS, ROUTINE W REFLEX MICROSCOPIC - Abnormal; Notable for the following components:   Color, Urine AMBER (*)    Leukocytes, UA TRACE (*)    Bacteria, UA MANY (*)    Squamous Epithelial / LPF 0-5 (*)    All other components within normal limits  CULTURE, BLOOD (ROUTINE X 2)  CULTURE, BLOOD (ROUTINE X 2)  URINE CULTURE  INFLUENZA PANEL BY PCR (  TYPE A & B)  I-STAT CG4 LACTIC ACID, ED     EKG  EKG Interpretation  Date/Time:  Wednesday June 25 2017 21:03:44 EDT Ventricular Rate:  108 PR Interval:    QRS Duration: 172 QT Interval:  394 QTC Calculation: 529 R Axis:   -90 Text Interpretation:  Sinus tachycardia Left bundle branch block No significant change since last tracing Confirmed by Blanchie Dessert [phone]) on 06/25/2017 9:07:33 PM       Radiology Dg Chest 2 View  Result Date: 06/25/2017 CLINICAL DATA:  Fever and shortness of breath. EXAM: CHEST - 2 VIEW COMPARISON:  PA and lateral chest 06/16/2017. Single-view of the chest 06/12/2017. CT chest 05/28/2017. FINDINGS: Left worse than right pleural effusions and airspace disease are not notably changed since the most recent examination. There is cardiomegaly without edema. No pneumothorax. Surgical clips right axilla noted. The patient is status post aortic valve replacement. IMPRESSION: No marked change in left greater than right pleural effusions and basilar airspace disease since the most recent exam. Cardiomegaly. Electronically Signed   By: Inge Rise M.D.   On: 06/25/2017 20:53    Procedures Procedures (including critical care time)  Medications Ordered in ED Medications  vancomycin (VANCOCIN) 2,000 mg in sodium chloride 0.9 % 500 mL IVPB (2,000 mg Intravenous New Bag/Given 06/25/17 2342)  acetaminophen (TYLENOL) tablet 650 mg (650 mg Oral Given 06/25/17 2025)  sodium chloride 0.9 % bolus 1,000 mL (0 mLs Intravenous Stopped 06/25/17 2256)  piperacillin-tazobactam (ZOSYN) IVPB 3.375 g (0 g Intravenous Stopped 06/25/17 2342)     Initial Impression / Assessment and Plan / ED Course  I have reviewed the triage vital signs and the nursing notes.  Pertinent labs & imaging results that were available during my care of the patient were reviewed by me and considered in my medical decision making (see chart for details).    Christinna Sprung is a 72 y.o. female who presents to ED for lower extremity weakness for  approximately 5 days. No tenderness to the back, equal sensation throughout. No erythema to knee joints. Exam not c/w septic arthritis. Noted to be febrile 101.6 and tachycardic upon ER arrival. Labs reviewed: leukocytosis of 11.7, lactic wdl. Bump in creatinine to 4.27 (previous 2-3). Code sepsis initiated. Blood and urine cx's obtained. UA reviewed with trace leuk's, 6-30 WBC's, many bacteria. Started on broad spectrum ABX. Given 1L fluids. Hx of CHF with mild LE edema. Given lactic of 1.08, weight-based fluids were restricted to 1L.   Patient with hx of recent thoracic aortic dissection repair. Given new lower extremity weakness and recent surgery, spinal cord infarct possible cause of weakness. Will obtain MR thoracic and lumbar spine. Discussed case with hospitalist, Dr. Alcario Drought. He agrees patient needs admission but recommends MRI be done emergently prior to admission. MRI not available at this facility. Will transfer to [facility] ER for emergent MRI. Discussed case with [facility] EDP, Dr. Leonides Schanz, who is accepts patient in transfer.    Patient seen by and discussed with Dr. Maryan Rued who agrees with treatment plan.    Final Clinical Impressions(s) / ED Diagnoses   Final diagnoses:  Fever, unspecified fever cause  AKI (acute kidney injury) [facility])  Weakness    ED Discharge Orders    None       Ward, Ozella Almond, PA-C 06/26/17 [id number]    Blanchie Dessert, MD 06/29/17 2008    Blanchie Dessert, MD 06/29/17 2011

## 2017-06-25 NOTE — Progress Notes (Signed)
A consult was received from an ED physician for zosyn and vancomycin per pharmacy dosing.  The patient's profile has been reviewed for ht/wt/allergies/indication/available labs.   A one time order has been placed for Zosyn 3.375 Gm and Vancomycin 2 Gm.  Further antibiotics/pharmacy consults should be ordered by admitting physician if indicated.                       Thank you, Dorrene German 06/25/2017  10:54 PM

## 2017-06-25 NOTE — ED Notes (Signed)
Patient transported to X-ray 

## 2017-06-25 NOTE — Telephone Encounter (Signed)
Received call from Physical Therapist.  Reported the pt. has shown a change in her leg strength since he saw her on Friday, 3/15.  Also, reported the pt. c/o numbness of the left mid-foot to her toes.  Able to wiggle toes (L) foot and rotate (L) foot @ ankle, without difficulty.  Per PT., the pt. is not exhibiting swelling or discoloration of the left foot.  Also, c/o increased pain and some swelling of bilateral knees.  Has Hx of arthritis in knees; asking about getting an injection in her knees.  Reported the pt. was able to walk with a cane with assistance on Friday; then, starting Saturday, her husband reported that she has not been able to stand and walk, due to weakness in her legs.  Per Physical Therapist, the pt. is showing weakness in bilateral legs with right > left.  Physical Therapist reported due to very limited space in the house, there is no room to walk with a walker, and pt. Would have difficult time being moved from her bed to the car.  Due to lower extremity weakness, numbness of left foot, increased pain in knees, and decline in mobility advised to send pt. To the ER via EMS.  Physical Therapist stated he will call EMS at this time.      Reason for Disposition . [1] MODERATE pain (e.g., interferes with normal activities, limping) AND [2] present > 3 days . [1] Weakness (i.e., paralysis, loss of muscle strength) of the face, arm / hand, or leg / foot on one side of the body AND [2] sudden onset AND [3] present now  Answer Assessment - Initial Assessment Questions 1. LOCATION and RADIATION: "Where is the pain located?"      Knee pain with left greater than right 2. QUALITY: "What does the pain feel like?"  (e.g., sharp, dull, aching, burning)     Constant dull pain 3. SEVERITY: "How bad is the pain?" "What does it keep you from doing?"   (Scale 1-10; or mild, moderate, severe)   -  MILD (1-3): doesn't interfere with normal activities    -  MODERATE (4-7): interferes with normal  activities (e.g., work or school) or awakens from sleep, limping    -  SEVERE (8-10): excruciating pain, unable to do any normal activities, unable to walk     Mild with rest; becomes severe with activity  4. ONSET: "When did the pain start?" "Does it come and go, or is it there all the time?"     Since discharged from hospital 5. RECURRENT: "Have you had this pain before?" If so, ask: "When, and what happened then?"     Hx of arthritic knees.  6. SETTING: "Has there been any recent work, exercise or other activity that involved that part of the body?"     No 7. AGGRAVATING FACTORS: "What makes the knee pain worse?" (e.g., walking, climbing stairs, running)    Moving, trying to get out of bed.  8. ASSOCIATED SYMPTOMS: "Is there any swelling or redness of the knee?"     C/o swelling of knees   9. OTHER SYMPTOMS: "Do you have any other symptoms?" (e.g., chest pain, difficulty breathing, fever, calf pain)     Physical Therapist stated noticeable decrease in strength of bilat. legs since Friday; could walk with cane with assistance on Friday. 10. PREGNANCY: "Is there any chance you are pregnant?" "When was your last menstrual period?"       no  Answer Assessment -  Initial Assessment Questions 1. SYMPTOM: "What is the main symptom you are concerned about?" (e.g., weakness, numbness)     C/o numbness in left mid foot to toes; weakness in bilateral legs.   2. ONSET: "When did this start?" (minutes, hours, days; while sleeping)    Saturday 3. LAST NORMAL: "When was the last time you were normal (no symptoms)?"     Friday 4. PATTERN "Does this come and go, or has it been constant since it started?"  "Is it present now?"     continuous 5. CARDIAC SYMPTOMS: "Have you had any of the following symptoms: chest pain, difficulty breathing, palpitations?"     Not asked 6. NEUROLOGIC SYMPTOMS: "Have you had any of the following symptoms: headache, dizziness, vision loss, double vision, changes in speech,  unsteady on your feet?"     Physical Therapist denied any other neurological deficit; pt. denied weakness or numbness of left UE; denied any other complaints  7. OTHER SYMPTOMS: "Do you have any other symptoms?"     Pain in bilateral knees from arthritic pain 8. PREGNANCY: "Is there any chance you are pregnant?" "When was your last menstrual period?"     no  Protocols used: KNEE PAIN-A-AH, NEUROLOGIC DEFICIT-A-AH

## 2017-06-25 NOTE — ED Notes (Signed)
EKG given to EDP,Plunkett,MD., for review. 

## 2017-06-26 ENCOUNTER — Inpatient Hospital Stay (HOSPITAL_COMMUNITY): Payer: Medicare Other

## 2017-06-26 ENCOUNTER — Other Ambulatory Visit: Payer: Self-pay | Admitting: *Deleted

## 2017-06-26 ENCOUNTER — Ambulatory Visit: Payer: Medicare Other | Admitting: Acute Care

## 2017-06-26 ENCOUNTER — Encounter (HOSPITAL_COMMUNITY): Payer: Self-pay | Admitting: General Practice

## 2017-06-26 ENCOUNTER — Other Ambulatory Visit: Payer: Self-pay

## 2017-06-26 DIAGNOSIS — R059 Cough, unspecified: Secondary | ICD-10-CM | POA: Diagnosis present

## 2017-06-26 DIAGNOSIS — I10 Essential (primary) hypertension: Secondary | ICD-10-CM | POA: Diagnosis not present

## 2017-06-26 DIAGNOSIS — M6281 Muscle weakness (generalized): Secondary | ICD-10-CM | POA: Diagnosis not present

## 2017-06-26 DIAGNOSIS — M5126 Other intervertebral disc displacement, lumbar region: Secondary | ICD-10-CM | POA: Diagnosis not present

## 2017-06-26 DIAGNOSIS — Z48812 Encounter for surgical aftercare following surgery on the circulatory system: Secondary | ICD-10-CM | POA: Diagnosis not present

## 2017-06-26 DIAGNOSIS — R29898 Other symptoms and signs involving the musculoskeletal system: Secondary | ICD-10-CM | POA: Diagnosis not present

## 2017-06-26 DIAGNOSIS — D631 Anemia in chronic kidney disease: Secondary | ICD-10-CM | POA: Diagnosis not present

## 2017-06-26 DIAGNOSIS — R0682 Tachypnea, not elsewhere classified: Secondary | ICD-10-CM | POA: Diagnosis not present

## 2017-06-26 DIAGNOSIS — Z8371 Family history of colonic polyps: Secondary | ICD-10-CM | POA: Diagnosis not present

## 2017-06-26 DIAGNOSIS — Z87891 Personal history of nicotine dependence: Secondary | ICD-10-CM | POA: Diagnosis not present

## 2017-06-26 DIAGNOSIS — I5032 Chronic diastolic (congestive) heart failure: Secondary | ICD-10-CM | POA: Diagnosis not present

## 2017-06-26 DIAGNOSIS — K219 Gastro-esophageal reflux disease without esophagitis: Secondary | ICD-10-CM | POA: Diagnosis not present

## 2017-06-26 DIAGNOSIS — N390 Urinary tract infection, site not specified: Secondary | ICD-10-CM

## 2017-06-26 DIAGNOSIS — N184 Chronic kidney disease, stage 4 (severe): Secondary | ICD-10-CM

## 2017-06-26 DIAGNOSIS — A4159 Other Gram-negative sepsis: Secondary | ICD-10-CM | POA: Diagnosis not present

## 2017-06-26 DIAGNOSIS — J90 Pleural effusion, not elsewhere classified: Secondary | ICD-10-CM | POA: Diagnosis not present

## 2017-06-26 DIAGNOSIS — R05 Cough: Secondary | ICD-10-CM | POA: Diagnosis present

## 2017-06-26 DIAGNOSIS — I130 Hypertensive heart and chronic kidney disease with heart failure and stage 1 through stage 4 chronic kidney disease, or unspecified chronic kidney disease: Secondary | ICD-10-CM | POA: Diagnosis not present

## 2017-06-26 DIAGNOSIS — I872 Venous insufficiency (chronic) (peripheral): Secondary | ICD-10-CM | POA: Diagnosis not present

## 2017-06-26 DIAGNOSIS — M5124 Other intervertebral disc displacement, thoracic region: Secondary | ICD-10-CM | POA: Diagnosis not present

## 2017-06-26 DIAGNOSIS — A419 Sepsis, unspecified organism: Secondary | ICD-10-CM | POA: Diagnosis not present

## 2017-06-26 DIAGNOSIS — N179 Acute kidney failure, unspecified: Secondary | ICD-10-CM | POA: Diagnosis not present

## 2017-06-26 DIAGNOSIS — Z87440 Personal history of urinary (tract) infections: Secondary | ICD-10-CM | POA: Diagnosis not present

## 2017-06-26 DIAGNOSIS — I447 Left bundle-branch block, unspecified: Secondary | ICD-10-CM | POA: Diagnosis not present

## 2017-06-26 DIAGNOSIS — M1991 Primary osteoarthritis, unspecified site: Secondary | ICD-10-CM | POA: Diagnosis not present

## 2017-06-26 DIAGNOSIS — R000 Tachycardia, unspecified: Secondary | ICD-10-CM | POA: Diagnosis not present

## 2017-06-26 DIAGNOSIS — D539 Nutritional anemia, unspecified: Secondary | ICD-10-CM | POA: Diagnosis present

## 2017-06-26 DIAGNOSIS — Z8249 Family history of ischemic heart disease and other diseases of the circulatory system: Secondary | ICD-10-CM | POA: Diagnosis not present

## 2017-06-26 DIAGNOSIS — M50221 Other cervical disc displacement at C4-C5 level: Secondary | ICD-10-CM | POA: Diagnosis not present

## 2017-06-26 DIAGNOSIS — R531 Weakness: Secondary | ICD-10-CM | POA: Diagnosis present

## 2017-06-26 DIAGNOSIS — I272 Pulmonary hypertension, unspecified: Secondary | ICD-10-CM | POA: Diagnosis not present

## 2017-06-26 DIAGNOSIS — M4804 Spinal stenosis, thoracic region: Secondary | ICD-10-CM | POA: Diagnosis not present

## 2017-06-26 DIAGNOSIS — M48061 Spinal stenosis, lumbar region without neurogenic claudication: Secondary | ICD-10-CM | POA: Diagnosis not present

## 2017-06-26 DIAGNOSIS — Z952 Presence of prosthetic heart valve: Secondary | ICD-10-CM

## 2017-06-26 DIAGNOSIS — R6520 Severe sepsis without septic shock: Secondary | ICD-10-CM | POA: Diagnosis not present

## 2017-06-26 DIAGNOSIS — E860 Dehydration: Secondary | ICD-10-CM | POA: Diagnosis not present

## 2017-06-26 DIAGNOSIS — M4802 Spinal stenosis, cervical region: Secondary | ICD-10-CM | POA: Diagnosis not present

## 2017-06-26 DIAGNOSIS — Z833 Family history of diabetes mellitus: Secondary | ICD-10-CM | POA: Diagnosis not present

## 2017-06-26 DIAGNOSIS — E559 Vitamin D deficiency, unspecified: Secondary | ICD-10-CM | POA: Diagnosis not present

## 2017-06-26 DIAGNOSIS — I5030 Unspecified diastolic (congestive) heart failure: Secondary | ICD-10-CM | POA: Diagnosis not present

## 2017-06-26 DIAGNOSIS — G629 Polyneuropathy, unspecified: Secondary | ICD-10-CM | POA: Diagnosis not present

## 2017-06-26 DIAGNOSIS — B9689 Other specified bacterial agents as the cause of diseases classified elsewhere: Secondary | ICD-10-CM | POA: Diagnosis not present

## 2017-06-26 LAB — PROTIME-INR
INR: 1.21
PROTHROMBIN TIME: 15.2 s (ref 11.4–15.2)

## 2017-06-26 LAB — LACTIC ACID, PLASMA
Lactic Acid, Venous: 0.6 mmol/L (ref 0.5–1.9)
Lactic Acid, Venous: 0.7 mmol/L (ref 0.5–1.9)

## 2017-06-26 LAB — BRAIN NATRIURETIC PEPTIDE: B NATRIURETIC PEPTIDE 5: 802.3 pg/mL — AB (ref 0.0–100.0)

## 2017-06-26 LAB — CBG MONITORING, ED: Glucose-Capillary: 84 mg/dL (ref 65–99)

## 2017-06-26 LAB — APTT: aPTT: 34 seconds (ref 24–36)

## 2017-06-26 LAB — CREATININE, URINE, RANDOM: CREATININE, URINE: 105.49 mg/dL

## 2017-06-26 LAB — PROCALCITONIN: Procalcitonin: 2.6 ng/mL

## 2017-06-26 MED ORDER — DM-GUAIFENESIN ER 30-600 MG PO TB12
1.0000 | ORAL_TABLET | Freq: Two times a day (BID) | ORAL | Status: DC | PRN
Start: 2017-06-26 — End: 2017-07-01
  Administered 2017-06-26: 1 via ORAL
  Filled 2017-06-26: qty 1

## 2017-06-26 MED ORDER — ASPIRIN EC 325 MG PO TBEC
325.0000 mg | DELAYED_RELEASE_TABLET | Freq: Every day | ORAL | Status: DC
  Administered 2017-06-26 – 2017-06-30 (×5): 325 mg via ORAL
  Filled 2017-06-26 (×5): qty 1

## 2017-06-26 MED ORDER — TRIAMCINOLONE ACETONIDE 0.5 % EX OINT: 1.0000 "application " | TOPICAL_OINTMENT | Freq: Every day | CUTANEOUS | Status: DC | PRN

## 2017-06-26 MED ORDER — VANCOMYCIN HCL IN DEXTROSE 750-5 MG/150ML-% IV SOLN
750.0000 mg | INTRAVENOUS | Status: DC
  Administered 2017-06-28: 750 mg via INTRAVENOUS
  Filled 2017-06-26: qty 150

## 2017-06-26 MED ORDER — HYDROXYZINE HCL 10 MG PO TABS
10.0000 mg | ORAL_TABLET | Freq: Three times a day (TID) | ORAL | Status: DC | PRN
  Filled 2017-06-26: qty 1

## 2017-06-26 MED ORDER — CARVEDILOL 3.125 MG PO TABS
3.1250 mg | ORAL_TABLET | Freq: Two times a day (BID) | ORAL | Status: DC
  Administered 2017-06-26 – 2017-06-30 (×10): 3.125 mg via ORAL
  Filled 2017-06-26 (×11): qty 1

## 2017-06-26 MED ORDER — CALCIUM CARBONATE ANTACID 500 MG PO CHEW
2.0000 | CHEWABLE_TABLET | Freq: Every day | ORAL | Status: DC | PRN
  Administered 2017-06-28: 400 mg via ORAL
  Filled 2017-06-26 (×2): qty 2

## 2017-06-26 MED ORDER — ACETAMINOPHEN 325 MG PO TABS: 650.0000 mg | ORAL_TABLET | Freq: Four times a day (QID) | ORAL | Status: DC | PRN

## 2017-06-26 MED ORDER — FAMOTIDINE 20 MG PO TABS
20.0000 mg | ORAL_TABLET | Freq: Every day | ORAL | Status: DC
  Administered 2017-06-27 – 2017-06-30 (×4): 20 mg via ORAL
  Filled 2017-06-26 (×4): qty 1

## 2017-06-26 MED ORDER — FLUTICASONE PROPIONATE 50 MCG/ACT NA SUSP: 2.0000 | Freq: Every day | NASAL | Status: DC | PRN

## 2017-06-26 MED ORDER — FAMOTIDINE 20 MG PO TABS
20.0000 mg | ORAL_TABLET | Freq: Two times a day (BID) | ORAL | Status: DC
  Administered 2017-06-26: 20 mg via ORAL
  Filled 2017-06-26: qty 1

## 2017-06-26 MED ORDER — HYDROCORTISONE 2.5 % RE CREA: 1.0000 "application " | TOPICAL_CREAM | Freq: Every day | RECTAL | Status: DC | PRN

## 2017-06-26 MED ORDER — ALBUTEROL SULFATE (2.5 MG/3ML) 0.083% IN NEBU
2.5000 mg | INHALATION_SOLUTION | RESPIRATORY_TRACT | Status: DC | PRN
  Administered 2017-06-26 – 2017-06-28 (×2): 2.5 mg via RESPIRATORY_TRACT
  Filled 2017-06-26 (×2): qty 3

## 2017-06-26 MED ORDER — PIPERACILLIN-TAZOBACTAM IN DEX 2-0.25 GM/50ML IV SOLN
2.2500 g | Freq: Three times a day (TID) | INTRAVENOUS | Status: DC
  Administered 2017-06-26 – 2017-06-28 (×7): 2.25 g via INTRAVENOUS
  Filled 2017-06-26 (×8): qty 50

## 2017-06-26 MED ORDER — ZOLPIDEM TARTRATE 5 MG PO TABS
5.0000 mg | ORAL_TABLET | Freq: Every evening | ORAL | Status: DC | PRN
  Administered 2017-06-26 – 2017-06-29 (×3): 5 mg via ORAL
  Filled 2017-06-26 (×3): qty 1

## 2017-06-26 MED ORDER — CALCITRIOL 0.25 MCG PO CAPS: 0.2500 ug | ORAL_CAPSULE | ORAL | Status: DC

## 2017-06-26 MED ORDER — FUROSEMIDE 10 MG/ML IJ SOLN
40.0000 mg | Freq: Once | INTRAMUSCULAR | Status: AC
  Administered 2017-06-26: 40 mg via INTRAVENOUS
  Filled 2017-06-26: qty 4

## 2017-06-26 MED ORDER — HYDRALAZINE HCL 10 MG PO TABS
10.0000 mg | ORAL_TABLET | Freq: Two times a day (BID) | ORAL | Status: DC
  Administered 2017-06-26 – 2017-06-30 (×8): 10 mg via ORAL
  Filled 2017-06-26 (×9): qty 1

## 2017-06-26 MED ORDER — POLYVINYL ALCOHOL 1.4 % OP SOLN: 1.0000 [drp] | Freq: Every day | OPHTHALMIC | Status: DC | PRN

## 2017-06-26 MED ORDER — PANTOPRAZOLE SODIUM 40 MG PO TBEC
40.0000 mg | DELAYED_RELEASE_TABLET | Freq: Every day | ORAL | Status: DC
  Administered 2017-06-26 – 2017-06-27 (×2): 40 mg via ORAL
  Filled 2017-06-26 (×2): qty 1

## 2017-06-26 MED ORDER — CALCITRIOL 0.25 MCG PO CAPS
0.2500 ug | ORAL_CAPSULE | ORAL | Status: DC
  Administered 2017-06-27 – 2017-06-30 (×2): 0.25 ug via ORAL
  Filled 2017-06-26 (×2): qty 1

## 2017-06-26 MED ORDER — TRAMADOL HCL 50 MG PO TABS
50.0000 mg | ORAL_TABLET | Freq: Two times a day (BID) | ORAL | Status: DC | PRN
  Administered 2017-06-26: 50 mg via ORAL
  Filled 2017-06-26: qty 1

## 2017-06-26 MED ORDER — LORAZEPAM 2 MG/ML IJ SOLN
0.5000 mg | Freq: Once | INTRAMUSCULAR | Status: AC
  Administered 2017-06-26: 0.5 mg via INTRAVENOUS
  Filled 2017-06-26: qty 1

## 2017-06-26 MED ORDER — HYDRALAZINE HCL 20 MG/ML IJ SOLN: 5.0000 mg | INTRAMUSCULAR | Status: DC | PRN

## 2017-06-26 MED ORDER — ENSURE ENLIVE PO LIQD
237.0000 mL | Freq: Two times a day (BID) | ORAL | Status: DC
  Administered 2017-06-27 – 2017-06-30 (×8): 237 mL via ORAL

## 2017-06-26 MED ORDER — GABAPENTIN 300 MG PO CAPS: 300.0000 mg | ORAL_CAPSULE | Freq: Three times a day (TID) | ORAL | Status: DC | PRN

## 2017-06-26 NOTE — Significant Event (Signed)
Rapid Response Event Note  Overview: Respiratory   Initial Focused Assessment: While on 5W, RN asked for help with patient's respiratory. Patient was experiencing shortness of breath, increased work of breathing, mild use of accessory muscles, lung sounds: L> crackles, R> clear in the uppers and diminished in the lowers.  + Murmur, recent cardiac surgery for aortic dissection, history of DCHF. Patient was only able to speak a few words at a time before needing a break. HR low 100s, SBP 130s, 100% on 2L [place], RR 24-28. [facility] NP paged by primary RN, I updated her on the patient's condition.  Interventions: -- Albuterol x 1, after neb, SOB and WOB improved, RR improved as well, now able to speak in full sentences. -- Lasix 40mg  x 1   Plan of Care (if not transferred): -- RN to follow up with [facility] NP if respiratory status does not improve  Event Summary:   at    [facility] Time 2250 End Time 2330  Ugochi Henzler R

## 2017-06-26 NOTE — ED Notes (Signed)
Per MD Justus Memory once surgery see's pt she can have diet orders if they are not planning on doing surgery.

## 2017-06-26 NOTE — H&P (Signed)
History and Physical    Weston Fulco FFM:[id number] DOB: 21-Jul-1945 DOA: 06/25/2017  Referring MD/NP/PA:   PCP: Hoyt Koch, MD   Patient coming from:  The patient is coming from home.  At baseline, pt is independent for most of ADL.  Chief Complaint: bilateral leg weakness, cough, increased urinary frequency  HPI: [name] is a 72 y.o. female with medical history sig pulmonary hypertension, pericardial effusion, frequent UTI, CKD-4, dCHF, pancreatitis, s/p of AVR, s/p of repair of type A aortic dissection on 06/01/17, who presents with bilateral leg weakness, cough, increased urinary frequency.  Pt underwent repair of type A aortic dissection on 06/01/17. She has been doing fine until 2 weeks ago when she started having bilateral lower leg weakness. It has been progressively getting worse. She cannot walk any more. She denies any back pain. No chest pain. She has dry cough in the past 2 weeks, but denies chest pain or shortness of breath. She reports increased urinary frequency, but no dysuria or burning on urination. She also has a fever and chills. No facial droop or slurred speech.  ED Course: pt was found to have WBC 11.7, lactic acid 1.08, positive urinalysis with trace leukocyte in the many bacteria, worsening renal function, temperature 101.6, tachycardia, tachypnea, oxygen saturation 97% on 2 L nasal cannula oxygen. Patient is admitted to telemetry bed as inpt.  # CXR showed no marked change in left greater than right pleural effusions and basilar airspace disease since the most recent exam.  Review of Systems:   General: has fevers, chills, no body weight gain,  has fatigue HEENT: no blurry vision, hearing changes or sore throat Respiratory: no dyspnea, has coughing, no wheezing CV: no chest pain, no palpitations GI: no nausea, vomiting, abdominal pain, diarrhea, constipation GU: no dysuria, burning on urination, increased urinary frequency, hematuria  Ext: has leg  edema Neuro: has bilateral leg weakness. Skin: no rash, no skin tear. MSK: No muscle spasm, no deformity, no limitation of range of movement in spin Heme: No easy bruising.  Travel history: No recent long distant travel.  Allergy:  Allergies  Allergen Reactions  . Minoxidil Palpitations and Other (See Comments)    unable to sleep  . Naproxen Swelling    Past Medical History:  Diagnosis Date  . Acute pancreatitis 02/04/2017   [name] 02/04/2017  . Adenomatous colon polyp 01/2002  . Anemia, chronic disease    /notes 02/04/2017  . Aortic valve disease    AI/AS  . Arthritis    "lower back, knees" (02/04/2017)  . CHF (congestive heart failure) ([place])   . CKD (chronic kidney disease), stage III ([place])   . Diverticulosis of colon   . DJD (degenerative joint disease)   . Frequent UTI   . Hemorrhoids   . Hypertension   . Lumbar back pain   . Neuropathy   . Obesity   . Pericardial effusion     in a patient with Diastolic heart  failure and Pericardial effusion  known since last 2 D echo in 11/2016 /notes 02/04/2017  . PONV (postoperative nausea and vomiting)   . Pulmonary HTN ([place])   . Renal cyst   . Venous insufficiency   . Vitamin D deficiency     Past Surgical History:  Procedure Laterality Date  . ABDOMINAL HYSTERECTOMY     "partial"  . AORTIC VALVE REPLACEMENT N/A 05/30/2017   Procedure: AORTIC VALVE REPLACEMENT (AVR);  Surgeon: Prescott Gum, Collier Salina, MD;  Location: [facility] [place];  Service: Vascular;  Laterality: N/A;  . COLONOSCOPY W/ BIOPSIES AND POLYPECTOMY     "bx was ok"  . IR THORACENTESIS ASP PLEURAL SPACE W/IMG GUIDE  06/11/2017  . IR THORACENTESIS ASP PLEURAL SPACE W/IMG GUIDE  06/12/2017  . REPAIR OF ACUTE ASCENDING THORACIC AORTIC DISSECTION N/A 05/30/2017   Procedure: REPLACEMENT OF ASCENDING AORTIC ANEURYSM AND REPAIRED CHRONIC ROOT DISSECTION WITH CIRC ARREST;  Surgeon: Ivin Poot, MD;  Location: [facility];  Service: Vascular;  Laterality: N/A;  . TEE WITHOUT  CARDIOVERSION N/A 05/28/2017   Procedure: TRANSESOPHAGEAL ECHOCARDIOGRAM (TEE);  Surgeon: Skeet Latch, MD;  Location: [place];  Service: Cardiovascular;  Laterality: N/A;  . TEE WITHOUT CARDIOVERSION N/A 05/30/2017   Procedure: TRANSESOPHAGEAL ECHOCARDIOGRAM (TEE);  Surgeon: Prescott Gum, Collier Salina, MD;  Location: [facility];  Service: Open Heart Surgery;  Laterality: N/A;    Social History:  reports that she quit smoking about 35 years ago. Her smoking use included cigarettes. She has a 6.00 pack-year smoking history. She has never used smokeless tobacco. She reports that she does not drink alcohol or use drugs.  Family History:  Family History  Problem Relation Age of Onset  . Diabetes Mother   . Colon polyps Mother   . Hypertension Mother   . Hypertension Brother   . Hypertension Sister   . Colon cancer Neg Hx      Prior to Admission medications   Medication Sig Start Date End Date Taking? Authorizing Provider  carvedilol (COREG) 3.125 MG tablet Take 1 tablet (3.125 mg total) by mouth 2 (two) times daily with a meal. 06/16/17  Yes Lars Pinks M, PA-C  furosemide (LASIX) 80 MG tablet Take 2 tablets (160 mg total) by mouth 2 (two) times daily. 06/16/17  Yes Tacy Dura, Donielle M, PA-C  gabapentin (NEURONTIN) 300 MG capsule TAKE 1 CAPSULE(300 MG) BY MOUTH THREE TIMES DAILY AS NEEDED FOR PAIN 01/14/17  Yes Hoyt Koch, MD  hydrALAZINE (APRESOLINE) 10 MG tablet Take 1 tablet (10 mg total) by mouth 2 (two) times daily. 06/16/17  Yes Lars Pinks M, PA-C  aspirin EC 325 MG EC tablet Take 1 tablet (325 mg total) by mouth daily. 06/16/17   Nani Skillern, PA-C  calcitRIOL (ROCALTROL) 0.25 MCG capsule Take 1 capsule (0.25 mcg total) by mouth every other day. Please take every Monday, Wednesday, Friday 06/16/17   Nani Skillern, PA-C  calcium carbonate (TUMS - DOSED IN MG ELEMENTAL CALCIUM) 500 MG chewable tablet Chew 2 tablets by mouth daily as needed for indigestion  or heartburn.    [provider]  FAMOTIDINE PO Take 1 tablet by mouth 2 (two) times daily as needed (gas).    [provider]  fluticasone (FLONASE) 50 MCG/ACT nasal spray Place 2 sprays daily into both nostrils. Patient taking differently: Place 2 sprays into both nostrils daily as needed for allergies.  02/12/17   Debbe Odea, MD  hydrocortisone (ANUSOL-HC) 2.5 % rectal cream Place 1 application rectally as needed. Patient taking differently: Place 1 application rectally daily as needed for hemorrhoids.  05/24/13   Noralee Space, MD  ondansetron (ZOFRAN) 4 MG tablet Take 1 tablet (4 mg total) by mouth every 8 (eight) hours as needed for nausea or vomiting. 01/04/16   Hoyt Koch, MD  pantoprazole (PROTONIX) 40 MG tablet Take 1 tablet (40 mg total) daily by mouth. Patient taking differently: Take 40 mg by mouth 2 (two) times daily as needed (indigestion).  02/12/17   Debbe Odea, MD  Propylene Glycol (SYSTANE  BALANCE) 0.6 % SOLN Place 1 drop into both eyes daily as needed (for dry eyes).    [provider]  traMADol (ULTRAM) 50 MG tablet Take 1 tablet (50 mg total) by mouth every 12 (twelve) hours as needed for severe pain. 06/16/17   Nani Skillern, PA-C  triamcinolone ointment (KENALOG) 0.5 % Apply 1 application topically daily as needed (itching). 06/16/17   Nani Skillern, PA-C    Physical Exam: Vitals:   06/26/17 0000 06/26/17 0030 06/26/17 0145 06/26/17 0433  BP: 120/70 127/76 (!) 113/54 122/75  Pulse: 95 (!) 101 75 88  Resp: (!) 21 (!) 26 (!) 22 20  Temp:   99.1 F (37.3 C)   TempSrc:   Oral   SpO2: 91% 94% 96% 99%  Weight:      Height:       General: Not in acute distress HEENT:       Eyes: PERRL, EOMI, no scleral icterus.       ENT: No discharge from the ears and nose, no pharynx injection, no tonsillar enlargement.        Neck: No JVD, no bruit, no mass felt. Heme: No neck lymph node enlargement. Cardiac: S1/S2, RRR, has  systolic murmurs, No gallops or rubs. Respiratory: No rales, wheezing, rhonchi or rubs. GI: Soft, nondistended, nontender, no rebound pain, no organomegaly, BS present. GU: No hematuria Ext: No pitting leg edema bilaterally. 2+DP/PT pulse bilaterally. Musculoskeletal: No joint deformities, No joint redness or warmth, no limitation of ROM in spin. Skin: No rashes.  Neuro: Alert, oriented X3, cranial nerves II-XII grossly intact, has bilateral leg weakness, she cannot lift up her legs from bed, she can only move feet. Knees reflux cannot be induced in supine position. Psych: Patient is not psychotic, no suicidal or hemocidal ideation.  Labs on Admission: I have personally reviewed following labs and imaging studies  CBC: Recent Labs  Lab 06/25/17 2005  WBC 11.7*  NEUTROABS 9.7*  HGB 8.0*  HCT 27.0*  MCV 103.8*  PLT 001   Basic Metabolic Panel: Recent Labs  Lab 06/25/17 2005  NA 144  K 3.9  CL 93*  CO2 39*  GLUCOSE 100*  BUN 99*  CREATININE 4.27*  CALCIUM 9.7   GFR: Estimated Creatinine Clearance: 13.4 mL/min (A) (by C-G formula based on SCr of 4.27 mg/dL (H)). Liver Function Tests: Recent Labs  Lab 06/25/17 2005  AST 54*  ALT 34  ALKPHOS 76  BILITOT 0.5  PROT 7.7  ALBUMIN 2.5*   No results for input(s): LIPASE, AMYLASE in the last 168 hours. No results for input(s): AMMONIA in the last 168 hours. Coagulation Profile: No results for input(s): INR, PROTIME in the last 168 hours. Cardiac Enzymes: No results for input(s): CKTOTAL, CKMB, CKMBINDEX, TROPONINI in the last 168 hours. BNP (last 3 results) Recent Labs    11/27/16 1036  PROBNP 202.0*   HbA1C: No results for input(s): HGBA1C in the last 72 hours. CBG: No results for input(s): GLUCAP in the last 168 hours. Lipid Profile: No results for input(s): CHOL, HDL, LDLCALC, TRIG, CHOLHDL, LDLDIRECT in the last 72 hours. Thyroid Function Tests: No results for input(s): TSH, T4TOTAL, FREET4, T3FREE,  THYROIDAB in the last 72 hours. Anemia Panel: No results for input(s): VITAMINB12, FOLATE, FERRITIN, TIBC, IRON, RETICCTPCT in the last 72 hours. Urine analysis:    Component Value Date/Time   COLORURINE AMBER (A) 06/25/2017 2244   APPEARANCEUR CLEAR 06/25/2017 2244   LABSPEC 1.012 06/25/2017 2244  PHURINE 5.0 06/25/2017 2244   GLUCOSEU NEGATIVE 06/25/2017 2244   GLUCOSEU NEGATIVE 12/08/2015 1024   HGBUR NEGATIVE 06/25/2017 2244   BILIRUBINUR NEGATIVE 06/25/2017 2244   BILIRUBINUR neg 01/04/2016 1054   [place] 06/25/2017 2244   PROTEINUR NEGATIVE 06/25/2017 2244   UROBILINOGEN negative 01/04/2016 1054   UROBILINOGEN 0.2 12/08/2015 1024   NITRITE NEGATIVE 06/25/2017 2244   LEUKOCYTESUR TRACE (A) 06/25/2017 2244   Sepsis Labs: @LABRCNTIP (procalcitonin:4,lacticidven:4) )No results found for this or any previous visit (from the past 240 hour(s)).   Radiological Exams on Admission: Dg Chest 2 View  Result Date: 06/25/2017 CLINICAL DATA:  Fever and shortness of breath. EXAM: CHEST - 2 VIEW COMPARISON:  PA and lateral chest 06/16/2017. Single-view of the chest 06/12/2017. CT chest 05/28/2017. FINDINGS: Left worse than right pleural effusions and airspace disease are not notably changed since the most recent examination. There is cardiomegaly without edema. No pneumothorax. Surgical clips right axilla noted. The patient is status post aortic valve replacement. IMPRESSION: No marked change in left greater than right pleural effusions and basilar airspace disease since the most recent exam. Cardiomegaly. Electronically Signed   By: Inge Rise M.D.   On: 06/25/2017 20:53     EKG: Independently reviewed. Sinus rhythm, QTC 529, LAD, left bundle blockage   Assessment/Plan Principal Problem:   Leg weakness, bilateral Active Problems:   Essential hypertension   Acute renal failure superimposed on stage 4 chronic kidney disease (HCC)   Acute lower UTI   LBBB (left bundle  branch block)   Pulmonary HTN (HCC)   Chronic diastolic CHF (congestive heart failure) (HCC)   S/P AVR   Sepsis (HCC)   Macrocytic anemia   Bilateral leg weakness   Cough   Leg weakness, bilateral: in etiology is not clear. Differential diagnoses include spinal cord ischemia, cord compression, Guillain-Barr syndrome. I discussed with neurologist, Dr. Leonel Ramsay. He recommended to get MRI of C-spine and T-spine. L-spin MRI was also ordered by EDP,will get it just in case. Depending on the MRI findings, to consult subspecialty.  -will place on tele bed as inpt -f/u MRI of whole spine -pt/ot when able to (not ordered)  Sepsis: Patient meets criteria for sepsis with leukocytosis, fever, tachycardia and tachypnea. Lactic acid is normal. Currentl hemodynamically stable. Urinalysis is positive for UTI, but not sure if UTI is the only source of infection. Pt also has cough, but CXR has no significant change. Will need to r/o Flu. -IV vanco and zosyn  -Bx and Ux -will get Procalcitonin and trend lactic acid levels per sepsis protocol. -IVF: 1L of NS bolus in ED. Will not continue IVF now (patient has a congestive heart failure, limiting aggressive IV fluids treatment).  Cough: likely due to bronchitis -on Abx as above -f/u Flu pcr -Prn Mucinex and albuterol nebs -sputum culture.  UTI: -on abx as above -f/u Ux  HTN:  -Continue home medications:cor regular, hydralazine, -IV hydralazine prn  Acute renal failure superimposed on stage 4 chronic kidney disease ([place]): Baseline Cre is ~3.0, pt's Cre is 4.27 and BUN 99 on admission. Likely due to UTI and prerenal secondary to dehydration and continuation ofdiuretics - IVF as above - Check  FeUrea - Follow up renal function by BMP - Hold lasxix  Chronic diastolic CHF in the settiong pulmonary HTN ([place]): 2-D echo on 05/28/17 showed EF of 60-65%. Patient has 2+ leg edema, but no JVD. Patient denies shortness of breath -Hold her Lasix due to  worsening renal Fx  Macrocytic  anemia: hemoglobin 8.0, which was 8.7 on 06/12/17,  Slight drop. Follow-up by CBC  GERD: -Protonix and pepcid   DVT ppx: SCD Code Status: Full code Family Communication: None at bed side.       Disposition Plan:  Anticipate discharge back to previous home environment Consults called: I spoke with neurologist, Dr. Leonel Ramsay, not formal consult. Admission status:   Inpatient/tele        Date of Service 06/26/2017    Ivor Costa [facility] Pager [phone]  If 7PM-7AM, please contact night-coverage [email or web address] Password [facility] 06/26/2017, 6:23 AM

## 2017-06-26 NOTE — ED Notes (Signed)
Patient transported to MRI 

## 2017-06-26 NOTE — ED Notes (Signed)
ED TO INPATIENT HANDOFF REPORT  Name/Age/Gender [name] 71 y.o. female  Code Status Code Status History    Date Active Date Inactive Code Status Order ID Comments User Context   05/30/2017 17:37 06/16/2017 15:25 Full Code [id number]  Miguel Aschoff Inpatient   05/22/2017 15:12 05/30/2017 17:37 Full Code [id number]  Elwin Mocha, MD ED   04/19/2017 20:17 04/20/2017 23:06 Full Code [id number]  Etta Quill, DO ED   02/04/2017 16:34 02/12/2017 20:54 Full Code [id number]  Rondel Jumbo, PA-C ED   04/26/2014 22:01 05/01/2014 18:14 Full Code [id number]  Ivor Costa, MD ED      Home/SNF/Other Home  Chief Complaint Weakness  Level of Care/Admitting Diagnosis ED Disposition    ED Disposition Condition Comment   Transfer to Another Facility  The patient appears reasonably stabilized for transfer considering the current resources, flow, and capabilities available in the ED at this time, and I doubt any other [facility] requiring further screening and/or treatment in the ED prior to transfer is p resent.       Medical History Past Medical History:  Diagnosis Date  . Acute pancreatitis 02/04/2017   [name] 02/04/2017  . Adenomatous colon polyp 01/2002  . Anemia, chronic disease    /notes 02/04/2017  . Aortic valve disease    AI/AS  . Arthritis    "lower back, knees" (02/04/2017)  . CHF (congestive heart failure) ([place])   . CKD (chronic kidney disease), stage III ([place])   . Diverticulosis of colon   . DJD (degenerative joint disease)   . Frequent UTI   . Hemorrhoids   . Hypertension   . Lumbar back pain   . Neuropathy   . Obesity   . Pericardial effusion     in a patient with Diastolic heart  failure and Pericardial effusion  known since last 2 D echo in 11/2016 /notes 02/04/2017  . PONV (postoperative nausea and vomiting)   . Pulmonary HTN ([place])   . Renal cyst   . Venous insufficiency   . Vitamin D deficiency     Allergies Allergies  Allergen Reactions  . Minoxidil  Palpitations and Other (See Comments)    unable to sleep  . Naproxen Swelling    IV Location/Drains/Wounds Patient Lines/Drains/Airways Status   Active Line/Drains/Airways    Name:   Placement date:   Placement time:   Site:   Days:   Peripheral IV 06/25/17 Right Wrist   06/25/17    2038    Wrist   1   Incision (Closed) 05/30/17 Chest Medial   05/30/17    1641     27   Incision (Closed) 05/30/17 Chest Right   05/30/17    1641     27   Incision (Closed) 05/30/17 Groin Right   05/30/17    1641     27          Labs/Imaging Results for orders placed or performed during the hospital encounter of 06/25/17 (from the past 48 hour(s))  CBC with Differential     Status: Abnormal   Collection Time: 06/25/17  8:05 PM  Result Value Ref Range   WBC 11.7 (H) 4.0 - 10.5 K/uL   RBC 2.60 (L) 3.87 - 5.11 MIL/uL   Hemoglobin 8.0 (L) 12.0 - 15.0 g/dL   HCT 27.0 (L) 36.0 - 46.0 %   MCV 103.8 (H) 78.0 - 100.0 fL   MCH 30.8 26.0 - 34.0 pg   MCHC 29.6 (L) 30.0 -  36.0 g/dL   RDW 16.9 (H) 11.5 - 15.5 %   Platelets 289 150 - 400 K/uL   Neutrophils Relative % 82 %   Neutro Abs 9.7 (H) 1.7 - 7.7 K/uL   Lymphocytes Relative 11 %   Lymphs Abs 1.3 0.7 - 4.0 K/uL   Monocytes Relative 6 %   Monocytes Absolute 0.7 0.1 - 1.0 K/uL   Eosinophils Relative 1 %   Eosinophils Absolute 0.1 0.0 - 0.7 K/uL   Basophils Relative 0 %   Basophils Absolute 0.0 0.0 - 0.1 K/uL    Comment: Performed at [facility], [place] [address], [place], [place] [address]  Comprehensive metabolic panel     Status: Abnormal   Collection Time: 06/25/17  8:05 PM  Result Value Ref Range   Sodium 144 135 - 145 mmol/L   Potassium 3.9 3.5 - 5.1 mmol/L   Chloride 93 (L) 101 - 111 mmol/L   CO2 39 (H) 22 - 32 mmol/L   Glucose, Bld 100 (H) 65 - 99 mg/dL   BUN 99 (H) 6 - 20 mg/dL    Comment: RESULTS CONFIRMED BY MANUAL DILUTION   Creatinine, Ser 4.27 (H) 0.44 - 1.00 mg/dL   Calcium 9.7 8.9 - 10.3 mg/dL   Total Protein 7.7  6.5 - 8.1 g/dL   Albumin 2.5 (L) 3.5 - 5.0 g/dL   AST 54 (H) 15 - 41 U/L   ALT 34 14 - 54 U/L   Alkaline Phosphatase 76 38 - 126 U/L   Total Bilirubin 0.5 0.3 - 1.2 mg/dL   GFR calc non Af Amer 10 (L) >60 mL/min   GFR calc Af Amer 11 (L) >60 mL/min    Comment: (NOTE) The eGFR has been calculated using the CKD EPI equation. This calculation has not been validated in all clinical situations. eGFR's persistently <60 mL/min signify possible Chronic Kidney Disease.    Anion gap 12 5 - 15    Comment: Performed at [facility], [place] [address] [place], [place] [address]  I-Stat CG4 Lactic Acid, ED     Status: None   Collection Time: 06/25/17  8:14 PM  Result Value Ref Range   Lactic Acid, Venous 1.08 0.5 - 1.9 mmol/L  Urinalysis, Routine w reflex microscopic     Status: Abnormal   Collection Time: 06/25/17 10:44 PM  Result Value Ref Range   Color, Urine AMBER (A) YELLOW    Comment: BIOCHEMICALS MAY BE AFFECTED BY COLOR   APPearance CLEAR CLEAR   Specific Gravity, Urine 1.012 1.005 - 1.030   pH 5.0 5.0 - 8.0   Glucose, UA NEGATIVE NEGATIVE mg/dL   Hgb urine dipstick NEGATIVE NEGATIVE   Bilirubin Urine NEGATIVE NEGATIVE   Ketones, ur NEGATIVE NEGATIVE mg/dL   Protein, ur NEGATIVE NEGATIVE mg/dL   Nitrite NEGATIVE NEGATIVE   Leukocytes, UA TRACE (A) NEGATIVE   RBC / HPF 0-5 0 - 5 RBC/hpf   WBC, UA 6-30 0 - 5 WBC/hpf   Bacteria, UA MANY (A) NONE SEEN   Squamous Epithelial / LPF 0-5 (A) NONE SEEN   Mucus PRESENT    Hyaline Casts, UA PRESENT     Comment: Performed at [facility], [place] [address], [place], [place] [address]   Dg Chest 2 View  Result Date: 06/25/2017 CLINICAL DATA:  Fever and shortness of breath. EXAM: CHEST - 2 VIEW COMPARISON:  PA and lateral chest 06/16/2017. Single-view of the chest 06/12/2017. CT chest 05/28/2017. FINDINGS: Left  worse than right pleural effusions and airspace disease are not notably changed since the most  recent examination. There is cardiomegaly without edema. No pneumothorax. Surgical clips right axilla noted. The patient is status post aortic valve replacement. IMPRESSION: No marked change in left greater than right pleural effusions and basilar airspace disease since the most recent exam. Cardiomegaly. Electronically Signed   By: Inge Rise M.D.   On: 06/25/2017 20:53    Pending Labs Unresulted Labs (From admission, onward)   Start     Ordered   06/25/17 2308  Influenza panel by PCR (type A & B)  (Influenza PCR Panel)  Once,   R     06/25/17 2308   06/25/17 1945  Urine culture  STAT,   STAT     06/25/17 1944   06/25/17 1936  Culture, blood (routine x 2)  BLOOD CULTURE X 2,   STAT     06/25/17 1935      Vitals/Pain Today's Vitals   06/25/17 2300 06/25/17 2330 06/26/17 0000 06/26/17 0030  BP: 130/73 123/67 120/70 127/76  Pulse: 100 97 95 (!) 101  Resp: 20 20 (!) 21 (!) 26  Temp:      TempSrc:      SpO2: 96% 93% 91% 94%  Weight:      Height:      PainSc:        Isolation Precautions Droplet precaution  Medications Medications  vancomycin (VANCOCIN) 2,000 mg in sodium chloride 0.9 % 500 mL IVPB (2,000 mg Intravenous New Bag/Given 06/25/17 2342)  acetaminophen (TYLENOL) tablet 650 mg (650 mg Oral Given 06/25/17 2025)  sodium chloride 0.9 % bolus 1,000 mL (0 mLs Intravenous Stopped 06/25/17 2256)  piperacillin-tazobactam (ZOSYN) IVPB 3.375 g (0 g Intravenous Stopped 06/25/17 2342)    Mobility non-ambulatory

## 2017-06-26 NOTE — ED Notes (Signed)
[facility] called for transport. 

## 2017-06-26 NOTE — ED Notes (Signed)
Attempted report 

## 2017-06-26 NOTE — Patient Outreach (Signed)
[facility]) Care Management [place] Coordination  06/26/2017  Manuela Halbur 1946/01/04 [id number]  Care Coordination outreach re: Seneca Gadbois, 72 y/o female referred to [place] after recent hospital visit February 14-June 16, 2017 for shortness of breath with hypoxia and pulmonary edema; patient noted to have aortic insufficiency and subacute dissection of aortic root; surgical AVR and repair of type A chronic ascending thoracic aortic dissection was completed on 05/30/17.Patient has history including, but not limited to, CKD; HTN;  dCHF; chronic pericardial effusion secondary to CHF, and aortic insufficiency. Patient was discharged home with home health services through [facility] in place for RN/ PT/ OT. Transition of care after hospitalization completed through patient's PCP office.   Noted through review of EMR that patient had presented to ED 06/25/17 with fever, AKI, weakness with inpatient admission pending   Secure communication via EMR for care coordination sent to [facility] and [facility] notifying of patient's current/ pending hospital admission  Plan:  [place] RN CM will follow patient's progress while hospitalized and collaborate with [facility] liaison's for discharge disposition.  [name], RN, BSN, [employer] [facility] Care Management  [phone]

## 2017-06-26 NOTE — ED Notes (Signed)
PAGED ADMITTING TO RN Rod Holler

## 2017-06-26 NOTE — ED Provider Notes (Signed)
2:45 AM  Pt is a 72 y.o. female who was transferred from [place] along emergency department for MRI and admission to the hospital service.  Patient recently had thoracic aortic dissection that underwent repair on 06/01/17.  Over the past week she developed bilateral lower extremity weakness.  Found to be febrile to 101.6 at [facility].  No sensory deficits.  Denies chest pain, abdominal pain or back pain.  Concern for possible spinal cord infarct given recent dissection.  Sent here for MRI of her thoracic and lumbar spine with and without contrast.  Found to have creatinine that has gone up from 2.93-4.27.  Patient has no complaints at this time.  On my examination, she has normal sensation diffusely, normal speech, cranial nerves II through XII intact.  She has normal strength in bilateral upper extremities but decreased strength in all muscle groups of the lower extremities bilaterally.  She has slightly diminished reflexes diffusely.  There is no clonus.  Patient was seen by Dr. Alcario Drought of the hospital service at [facility].  We will consult our hospitalist service here.  She has MRIs ordered and has been given a broad-spectrum antibiotics.  No other obvious source of fever appreciated.  She has no complaints at this time.   3:44 AM Discussed patient's case with hospitalist, Dr. Blaine Hamper.  I have recommended admission and patient (and family if present) agree with this plan. Admitting physician will place admission orders.   Hospitalist states he plans to follow-up on patient's MRI.  I reviewed all nursing notes, vitals, pertinent previous records, EKGs, lab and urine results, imaging (as available).     [name], Martha Bison, DO 06/26/17 [phone]

## 2017-06-26 NOTE — Progress Notes (Signed)
Pharmacy Antibiotic Note  [name] is a 72 y.o. female admitted on 06/25/2017 with fevers/weakness, possible urosepsis .  Pharmacy has been consulted for Vancomycin and Zosyn dosing.  Vancomycin  2 g IV given in ED at 2345  Plan: Vancomycin 750 mg IV q48h Zosyn 2.25 g IV q8h  Height: 5\' 6"  (167.6 cm) Weight: 190 lb (86.2 kg) IBW/kg (Calculated) : 59.3  Temp (24hrs), Avg:100.1 F (37.8 C), Min:99.1 F (37.3 C), Max:101.6 F (38.7 C)  Recent Labs  Lab 06/25/17 2005 06/25/17 2014  WBC 11.7*  --   CREATININE 4.27*  --   LATICACIDVEN  --  1.08    Estimated Creatinine Clearance: 13.4 mL/min (A) (by C-G formula based on SCr of 4.27 mg/dL (H)).    Allergies  Allergen Reactions  . Minoxidil Palpitations and Other (See Comments)    unable to sleep  . Naproxen Swelling   Caryl Pina 06/26/2017 5:15 AM

## 2017-06-26 NOTE — ED Notes (Signed)
Cleaned patient up, changed patient bed patient is now resting with family at bedside and call bell in reach

## 2017-06-26 NOTE — Progress Notes (Addendum)
Patient seen and evaluated. Agree with plan in H and P. Please refer for details. Pt most concerned about weakness.   MRI obtained of spine and of particular interests reports 2. Moderate sized central disc protrusion at C4-5 with resultant moderate canal and severe bilateral lateral recess stenosis with bilateral L5 nerve root impingement.  Will contact neurosurgery to see if from their standpoint there is anything that they have to offer given above report.   Otherwise if there is no surgery found weakness could be secondary to sepsis of which patient is on broad spectrum antibiotics.  Gen: pt in nad, alert and awake CV: no cyanosis Pulm: no increased wob, no wheezes  Will reassess next am. Pt stable and smiling at times  Velvet Bathe, MD  addenduM: Spoke with neurosurgery who did not feel that generalized lower extremity was secondary to abnormal findings on MRI.

## 2017-06-27 ENCOUNTER — Ambulatory Visit: Payer: Self-pay | Admitting: *Deleted

## 2017-06-27 LAB — BASIC METABOLIC PANEL
ANION GAP: 10 (ref 5–15)
BUN: 100 mg/dL — AB (ref 6–20)
CALCIUM: 9 mg/dL (ref 8.9–10.3)
CO2: 34 mmol/L — ABNORMAL HIGH (ref 22–32)
Chloride: 97 mmol/L — ABNORMAL LOW (ref 101–111)
Creatinine, Ser: 3.68 mg/dL — ABNORMAL HIGH (ref 0.44–1.00)
GFR calc Af Amer: 13 mL/min — ABNORMAL LOW (ref >60)
GFR calc non Af Amer: 11 mL/min — ABNORMAL LOW (ref >60)
GLUCOSE: 99 mg/dL (ref 65–99)
POTASSIUM: 3.6 mmol/L (ref 3.5–5.1)
Sodium: 141 mmol/L (ref 135–145)

## 2017-06-27 LAB — GLUCOSE, CAPILLARY: GLUCOSE-CAPILLARY: 102 mg/dL — AB (ref 65–99)

## 2017-06-27 LAB — CBC
HEMATOCRIT: 23.6 % — AB (ref 36.0–46.0)
Hemoglobin: 7 g/dL — ABNORMAL LOW (ref 12.0–15.0)
MCH: 30.3 pg (ref 26.0–34.0)
MCHC: 29.7 g/dL — AB (ref 30.0–36.0)
MCV: 102.2 fL — ABNORMAL HIGH (ref 78.0–100.0)
Platelets: 240 10*3/uL (ref 150–400)
RBC: 2.31 MIL/uL — ABNORMAL LOW (ref 3.87–5.11)
RDW: 16.7 % — AB (ref 11.5–15.5)
WBC: 8.1 10*3/uL (ref 4.0–10.5)

## 2017-06-27 LAB — UREA NITROGEN, URINE: UREA NITROGEN UR: 650 mg/dL

## 2017-06-27 NOTE — Consult Note (Signed)
   [facility] Inpatient Consult   06/27/2017  [name] Sep 14, 1945 [id number]  Patient is currently active with [facility] for chronic disease management services.  Patient has been engaged by a [facility] [employer], Richarda Osmond.  Our community based plan of care has focused on disease management and community resource support.  Patient will continue to be followed and will be evaluated for monthly home visits for assessments and disease process education.  Met with the patient and her husband at the bedside regarding ongoing services.  Patient states she is so weak that she couldn't even move her legs the day of admission. Expressed to her that she may need some short term rehab.  She spoke of the difficulties of her managing at home. Will continue to follow for ongoing needs and progression.  Made Inpatient Case Manager aware that [facility] following.   Of note, [facility] Care Management services does not replace or interfere with any services that are needed or arranged by inpatient case management or social work.  For additional questions or referrals please contact:   Natividad Brood, RN BSN [facility] Liaison  [phone] business mobile phone Toll free office [phone]

## 2017-06-27 NOTE — Progress Notes (Signed)
PROGRESS NOTE    [name]  RWE:[id number] DOB: 29-Apr-1945 DOA: 06/25/2017 PCP: Hoyt Koch, MD    Brief Narrative:  72 y.o. female with medical history sig pulmonary hypertension, pericardial effusion, frequent UTI, CKD-4, dCHF, pancreatitis, s/p of AVR, s/p of repair of type A aortic dissection on 06/01/17, who presents with bilateral leg weakness, cough, increased urinary frequency  Assessment & Plan:   Principal Problem:   Leg weakness, bilateral - most likely secondary to generalized weakness. Discussed with neurosurgeon who did not find cause after evaluating MRI from his standpoint. - As such I suspect most likely secondary to sepsis and deconditioning    Sepsis ([place]) - source most likely urinary - continue broad spectrum abx  Active Problems:   Essential hypertension - Pt on carvedilol, hydralazine    Acute renal failure superimposed on stage 4 chronic kidney disease ([place]) - stable currently baseline around 3 - improved serum creatinine today.    LBBB (left bundle branch block)   Pulmonary HTN (HCC)   Chronic diastolic CHF (congestive heart failure) (HCC)   S/P AVR   Macrocytic anemia   Bilateral leg weakness   Cough    DVT prophylaxis: SCD's Code Status: Full Family Communication: none at bedside. Disposition Plan: pending improvement in condition   Consultants:   none   Procedures: none   Antimicrobials: Vancomycin and Zosyn   Subjective: Pt has no new complaints. Still feeling weak  Objective: Vitals:   06/26/17 2140 06/26/17 2312 06/27/17 0528 06/27/17 1338  BP: (!) 135/94 135/74 (!) 141/82 138/76  Pulse: (!) 101 (!) 103 91   Resp: 18 (!) 24 20 (!) 21  Temp: 99.4 F (37.4 C)  98.2 F (36.8 C) 98.1 F (36.7 C)  TempSrc: Oral  Oral Oral  SpO2: 100% 98% 99% 100%  Weight:   84.6 kg (186 lb 8.2 oz)   Height:        Intake/Output Summary (Last 24 hours) at 06/27/2017 1714 Last data filed at 06/27/2017 1500 Gross per 24 hour    Intake 360 ml  Output -  Net 360 ml   Filed Weights   06/25/17 2038 06/27/17 0528  Weight: 86.2 kg (190 lb) 84.6 kg (186 lb 8.2 oz)    Examination:  General exam: Appears calm and comfortable, in nad.  Respiratory system: Clear to auscultation. Respiratory effort normal. Equal chest rise. Cardiovascular system: S1 & S2 heard, RRR.  Gastrointestinal system: Abdomen is nondistended, soft and nontender. No organomegaly or masses felt. Normal bowel sounds heard. Central nervous system: Alert and oriented. No focal neurological deficits. Extremities: warm, + pulses Skin: No rashes, lesions or ulcers, on limited exam. Psychiatry: Mood & affect appropriate.     Data Reviewed: I have personally reviewed following labs and imaging studies  CBC: Recent Labs  Lab 06/25/17 2005 06/27/17 0454  WBC 11.7* 8.1  NEUTROABS 9.7*  --   HGB 8.0* 7.0*  HCT 27.0* 23.6*  MCV 103.8* 102.2*  PLT 289 619   Basic Metabolic Panel: Recent Labs  Lab 06/25/17 2005 06/27/17 0454  NA 144 141  K 3.9 3.6  CL 93* 97*  CO2 39* 34*  GLUCOSE 100* 99  BUN 99* 100*  CREATININE 4.27* 3.68*  CALCIUM 9.7 9.0   GFR: Estimated Creatinine Clearance: 15.4 mL/min (A) (by C-G formula based on SCr of 3.68 mg/dL (H)). Liver Function Tests: Recent Labs  Lab 06/25/17 2005  AST 54*  ALT 34  ALKPHOS 76  BILITOT 0.5  PROT 7.7  ALBUMIN 2.5*   No results for input(s): LIPASE, AMYLASE in the last 168 hours. No results for input(s): AMMONIA in the last 168 hours. Coagulation Profile: Recent Labs  Lab 06/26/17 0730  INR 1.21   Cardiac Enzymes: No results for input(s): CKTOTAL, CKMB, CKMBINDEX, TROPONINI in the last 168 hours. BNP (last 3 results) Recent Labs    11/27/16 1036  PROBNP 202.0*   HbA1C: No results for input(s): HGBA1C in the last 72 hours. CBG: Recent Labs  Lab 06/26/17 0819 06/27/17 0827  GLUCAP 84 102*   Lipid Profile: No results for input(s): CHOL, HDL, LDLCALC, TRIG,  CHOLHDL, LDLDIRECT in the last 72 hours. Thyroid Function Tests: No results for input(s): TSH, T4TOTAL, FREET4, T3FREE, THYROIDAB in the last 72 hours. Anemia Panel: No results for input(s): VITAMINB12, FOLATE, FERRITIN, TIBC, IRON, RETICCTPCT in the last 72 hours. Sepsis Labs: Recent Labs  Lab 06/25/17 2014 06/26/17 0730 06/26/17 1039  PROCALCITON  --  2.60  --   LATICACIDVEN 1.08 0.6 0.7    Recent Results (from the past 240 hour(s))  Culture, blood (routine x 2)     Status: None (Preliminary result)   Collection Time: 06/25/17  8:06 PM  Result Value Ref Range Status   Specimen Description   Final    BLOOD RIGHT ANTECUBITAL Performed at [facility] [address], [place], [place] [address]    Special Requests   Final    BOTTLES DRAWN AEROBIC AND ANAEROBIC Blood Culture adequate volume Performed at [facility] [address], [place], [place] [address]    Culture   Final    NO GROWTH 2 DAYS Performed at [facility] [address], [place], [place] [address]    Report Status PENDING  Incomplete  Urine culture     Status: Abnormal (Preliminary result)   Collection Time: 06/25/17 10:44 PM  Result Value Ref Range Status   Specimen Description   Final    URINE, RANDOM Performed at [facility] [address], [place], [place] [address]    Special Requests   Final    NONE Performed at [facility], [place] [address], [place], [place] [address]    Culture (A)  Final    >=100,000 COLONIES/mL ENTEROBACTER AEROGENES SUSCEPTIBILITIES TO FOLLOW Performed at [facility], [place] [address] [place], [place] [address]    Report Status PENDING  Incomplete  Culture, blood (routine x 2)     Status: None (Preliminary result)   Collection Time: 06/25/17 11:40 PM  Result Value Ref Range Status   Specimen Description   Final    BLOOD LEFT ARM Performed at [facility]  [address], [place], [place] [address]    Special Requests   Final    BOTTLES DRAWN AEROBIC AND ANAEROBIC Blood Culture adequate volume Performed at [facility] [address], [place], [place] [address]    Culture   Final    NO GROWTH 1 DAY Performed at [facility], [place] [address] [place], [place] [address]    Report Status PENDING  Incomplete         Radiology Studies: Dg Chest 2 View  Result Date: 06/25/2017 CLINICAL DATA:  Fever and shortness of breath. EXAM: CHEST - 2 VIEW COMPARISON:  PA and lateral chest 06/16/2017. Single-view of the chest 06/12/2017. CT chest 05/28/2017. FINDINGS: Left worse than right pleural effusions and airspace disease are not notably changed since  the most recent examination. There is cardiomegaly without edema. No pneumothorax. Surgical clips right axilla noted. The patient is status post aortic valve replacement. IMPRESSION: No marked change in left greater than right pleural effusions and basilar airspace disease since the most recent exam. Cardiomegaly. Electronically Signed   By: Inge Rise M.D.   On: 06/25/2017 20:53   Mr Cervical Spine Wo Contrast  Result Date: 06/26/2017 CLINICAL DATA:  Initial evaluation for bilateral lower extremity weakness, history of recent thoracic aortic dissection repair. EXAM: MRI CERVICAL AND LUMBAR SPINE WITHOUT CONTRAST TECHNIQUE: Multiplanar and multiecho pulse sequences of the cervical spine, to include the craniocervical junction and cervicothoracic junction, and lumbar spine, were obtained without intravenous contrast. Please note that 80 MRI of the thoracic spine was ordered as well, however, patient was unable to tolerate the full length of the exam. COMPARISON:  None. FINDINGS: MRI CERVICAL SPINE FINDINGS Alignment: Examination technically limited by motion artifact and patient positioning. Straightening of the normal mid and lower cervical lordosis. No discernible listhesis or  malalignment. Vertebrae: Vertebral body heights are grossly maintained without evidence for acute or chronic fracture. Signal intensity within the visualized bone marrow is diffusely abnormal with heterogeneous appearance and markedly decreased T1 and T2 signal intensity findings suspected to be related to patient's anemia with possible osteoporotic changes. No worrisome osseous lesions. Probable subcentimeter benign hemangioma noted within the T5 vertebral body. No other discrete osseous lesions. Mild reactive edema noted about a few facets within the cervical spine due to facet arthritis. Cord: Signal intensity within the cervical spinal cord is grossly within normal limits. No cord signal abnormality to suggest cord infarct. No abnormal cord expansion or edema. Visualized upper thoracic spinal cord within normal limits. Posterior Fossa, vertebral arteries, paraspinal tissues: Visualized brain and posterior fossa within normal limits. Craniocervical junction normal. Paraspinous soft tissues within normal limits. No abnormal prevertebral edema. Normal intravascular flow voids within the vertebral arteries grossly maintained. Disc levels: C2-C3: Mild uncovertebral hypertrophy. Left-sided facet degeneration. Moderate left C3 foraminal stenosis. No canal or right neural foraminal narrowing. C3-C4: Bilateral uncovertebral hypertrophy without significant disc bulge. Moderate to advanced bilateral facet degeneration. No significant canal stenosis. Moderate right with mild left C4 foraminal narrowing. C4-C5: Minimal disc bulge. Mild bilateral uncovertebral hypertrophy. Advanced bilateral facet degeneration, right greater than left. No significant canal stenosis. Mild right C5 foraminal narrowing. C5-C6: Diffuse degenerative disc osteophyte. Broad posterior component flattens the ventral thecal sac. Moderate facet hypertrophy. Resultant moderate canal with severe bilateral C6 foraminal narrowing. C6-C7: Broad posterior  disc bulge, eccentric to the left. Bilateral uncovertebral spurring. Flattening of the ventral CSF without significant spinal stenosis. Moderate left with mild to moderate right C7 foraminal narrowing. C7-T1: Bilateral facet hypertrophy. No significant canal or foraminal stenosis. MRI LUMBAR SPINE FINDINGS Segmentation: Normal segmentation. Lowest well-formed disc labeled the L5-S1 level. Alignment: Vertebral bodies normally aligned with preservation of the normal lumbar lordosis. No listhesis. Vertebrae: Vertebral body heights are maintained without evidence for acute or chronic fracture. Signal intensity within the visualized bone marrow is diffusely abnormal with heterogeneous and markedly decreased T1 and T2 hypointensity. Findings suspected to be related to patient's history of anemia and/or osteoporosis. 17 mm benign hemangioma noted within the L3 vertebral body. No worrisome osseous lesions. Conus medullaris and cauda equina: Conus extends to the L1 level. Conus and cauda equina appear normal. No imaging findings to suggest spinal cord infarct. No abnormal cord edema or expansion. Paraspinal and other soft tissues: Paraspinous soft tissues demonstrate no acute  abnormality. Scattered bilateral renal cysts noted, largest of which seen on the left and measures approximately 6.3 cm. Intra-abdominal aorta dilated up to 3.5 cm in diameter. This is better evaluated on recent CT from 05/28/2017. Disc levels: L1-2:  Unremarkable. L2-3: Mild diffuse disc bulge with disc desiccation. No canal or foraminal stenosis. L3-4: Minimal disc bulge with disc desiccation. Moderate facet and ligament flavum hypertrophy. Small bilateral joint effusions. No significant canal or foraminal stenosis. L4-5: Chronic diffuse degenerative disc bulge with intervertebral disc space narrowing. Broad posterior disc protrusion indents the ventral thecal sac. Associated slight cephalad and caudad migration. Protruding disc impinges upon the  descending L5 nerve roots in the lateral recesses bilaterally. Superimposed mild facet ligament flavum hypertrophy. Resultant moderate canal with severe bilateral lateral recess stenosis. Mild bilateral L4 foraminal stenosis, right greater than left. L5-S1: Negative interspace. Epidural lipomatosis. No significant canal or foraminal stenosis. IMPRESSION: MRI CERVICAL SPINE IMPRESSION 1. Normal MRI appearance of the cervical spinal cord. No evidence for cord infarct or other acute abnormality. 2. Degenerative disc osteophyte at C5-6 with resultant moderate canal and severe bilateral C6 foraminal stenosis. 3. Additional cervical spondylolysis and facet hypertrophy without significant spinal stenosis. Additional moderate multilevel foraminal narrowing as above. MRI LUMBAR SPINE IMPRESSION 1. Normal MRI appearance of the distal spinal cord/conus medullaris. No evidence for cord infarct. 2. Moderate sized central disc protrusion at C4-5 with resultant moderate canal and severe bilateral lateral recess stenosis with bilateral L5 nerve root impingement. 3. Additional fairly mild for age multilevel degenerative spondylolysis as above. No other significant stenosis or neural impingement. 4. Markedly abnormal appearance of the visualized bone marrow, suspected to be related to patient's profound anemia and/or osteoporosis. Please note that patient was unable to tolerate the full length of the exam, and MRI of the thoracic spine was not performed. Electronically Signed   By: Jeannine Boga M.D.   On: 06/26/2017 08:13   Mr Thoracic Spine Wo Contrast  Result Date: 06/26/2017 CLINICAL DATA:  Initial evaluation for bilateral lower extremity weakness, history of recent thoracic aortic dissection repair. EXAM: MRI CERVICAL AND LUMBAR SPINE WITHOUT CONTRAST TECHNIQUE: Multiplanar and multiecho pulse sequences of the cervical spine, to include the craniocervical junction and cervicothoracic junction, and lumbar spine, were  obtained without intravenous contrast. Please note that 80 MRI of the thoracic spine was ordered as well, however, patient was unable to tolerate the full length of the exam. COMPARISON:  None. FINDINGS: MRI CERVICAL SPINE FINDINGS Alignment: Examination technically limited by motion artifact and patient positioning. Straightening of the normal mid and lower cervical lordosis. No discernible listhesis or malalignment. Vertebrae: Vertebral body heights are grossly maintained without evidence for acute or chronic fracture. Signal intensity within the visualized bone marrow is diffusely abnormal with heterogeneous appearance and markedly decreased T1 and T2 signal intensity findings suspected to be related to patient's anemia with possible osteoporotic changes. No worrisome osseous lesions. Probable subcentimeter benign hemangioma noted within the T5 vertebral body. No other discrete osseous lesions. Mild reactive edema noted about a few facets within the cervical spine due to facet arthritis. Cord: Signal intensity within the cervical spinal cord is grossly within normal limits. No cord signal abnormality to suggest cord infarct. No abnormal cord expansion or edema. Visualized upper thoracic spinal cord within normal limits. Posterior Fossa, vertebral arteries, paraspinal tissues: Visualized brain and posterior fossa within normal limits. Craniocervical junction normal. Paraspinous soft tissues within normal limits. No abnormal prevertebral edema. Normal intravascular flow voids within the vertebral arteries grossly  maintained. Disc levels: C2-C3: Mild uncovertebral hypertrophy. Left-sided facet degeneration. Moderate left C3 foraminal stenosis. No canal or right neural foraminal narrowing. C3-C4: Bilateral uncovertebral hypertrophy without significant disc bulge. Moderate to advanced bilateral facet degeneration. No significant canal stenosis. Moderate right with mild left C4 foraminal narrowing. C4-C5: Minimal disc  bulge. Mild bilateral uncovertebral hypertrophy. Advanced bilateral facet degeneration, right greater than left. No significant canal stenosis. Mild right C5 foraminal narrowing. C5-C6: Diffuse degenerative disc osteophyte. Broad posterior component flattens the ventral thecal sac. Moderate facet hypertrophy. Resultant moderate canal with severe bilateral C6 foraminal narrowing. C6-C7: Broad posterior disc bulge, eccentric to the left. Bilateral uncovertebral spurring. Flattening of the ventral CSF without significant spinal stenosis. Moderate left with mild to moderate right C7 foraminal narrowing. C7-T1: Bilateral facet hypertrophy. No significant canal or foraminal stenosis. MRI LUMBAR SPINE FINDINGS Segmentation: Normal segmentation. Lowest well-formed disc labeled the L5-S1 level. Alignment: Vertebral bodies normally aligned with preservation of the normal lumbar lordosis. No listhesis. Vertebrae: Vertebral body heights are maintained without evidence for acute or chronic fracture. Signal intensity within the visualized bone marrow is diffusely abnormal with heterogeneous and markedly decreased T1 and T2 hypointensity. Findings suspected to be related to patient's history of anemia and/or osteoporosis. 17 mm benign hemangioma noted within the L3 vertebral body. No worrisome osseous lesions. Conus medullaris and cauda equina: Conus extends to the L1 level. Conus and cauda equina appear normal. No imaging findings to suggest spinal cord infarct. No abnormal cord edema or expansion. Paraspinal and other soft tissues: Paraspinous soft tissues demonstrate no acute abnormality. Scattered bilateral renal cysts noted, largest of which seen on the left and measures approximately 6.3 cm. Intra-abdominal aorta dilated up to 3.5 cm in diameter. This is better evaluated on recent CT from 05/28/2017. Disc levels: L1-2:  Unremarkable. L2-3: Mild diffuse disc bulge with disc desiccation. No canal or foraminal stenosis. L3-4:  Minimal disc bulge with disc desiccation. Moderate facet and ligament flavum hypertrophy. Small bilateral joint effusions. No significant canal or foraminal stenosis. L4-5: Chronic diffuse degenerative disc bulge with intervertebral disc space narrowing. Broad posterior disc protrusion indents the ventral thecal sac. Associated slight cephalad and caudad migration. Protruding disc impinges upon the descending L5 nerve roots in the lateral recesses bilaterally. Superimposed mild facet ligament flavum hypertrophy. Resultant moderate canal with severe bilateral lateral recess stenosis. Mild bilateral L4 foraminal stenosis, right greater than left. L5-S1: Negative interspace. Epidural lipomatosis. No significant canal or foraminal stenosis. IMPRESSION: MRI CERVICAL SPINE IMPRESSION 1. Normal MRI appearance of the cervical spinal cord. No evidence for cord infarct or other acute abnormality. 2. Degenerative disc osteophyte at C5-6 with resultant moderate canal and severe bilateral C6 foraminal stenosis. 3. Additional cervical spondylolysis and facet hypertrophy without significant spinal stenosis. Additional moderate multilevel foraminal narrowing as above. MRI LUMBAR SPINE IMPRESSION 1. Normal MRI appearance of the distal spinal cord/conus medullaris. No evidence for cord infarct. 2. Moderate sized central disc protrusion at C4-5 with resultant moderate canal and severe bilateral lateral recess stenosis with bilateral L5 nerve root impingement. 3. Additional fairly mild for age multilevel degenerative spondylolysis as above. No other significant stenosis or neural impingement. 4. Markedly abnormal appearance of the visualized bone marrow, suspected to be related to patient's profound anemia and/or osteoporosis. Please note that patient was unable to tolerate the full length of the exam, and MRI of the thoracic spine was not performed. Electronically Signed   By: Jeannine Boga M.D.   On: 06/26/2017 08:13   Mr  Lumbar  Spine Wo Contrast  Result Date: 06/26/2017 CLINICAL DATA:  Initial evaluation for bilateral lower extremity weakness, history of recent thoracic aortic dissection repair. EXAM: MRI CERVICAL AND LUMBAR SPINE WITHOUT CONTRAST TECHNIQUE: Multiplanar and multiecho pulse sequences of the cervical spine, to include the craniocervical junction and cervicothoracic junction, and lumbar spine, were obtained without intravenous contrast. Please note that 80 MRI of the thoracic spine was ordered as well, however, patient was unable to tolerate the full length of the exam. COMPARISON:  None. FINDINGS: MRI CERVICAL SPINE FINDINGS Alignment: Examination technically limited by motion artifact and patient positioning. Straightening of the normal mid and lower cervical lordosis. No discernible listhesis or malalignment. Vertebrae: Vertebral body heights are grossly maintained without evidence for acute or chronic fracture. Signal intensity within the visualized bone marrow is diffusely abnormal with heterogeneous appearance and markedly decreased T1 and T2 signal intensity findings suspected to be related to patient's anemia with possible osteoporotic changes. No worrisome osseous lesions. Probable subcentimeter benign hemangioma noted within the T5 vertebral body. No other discrete osseous lesions. Mild reactive edema noted about a few facets within the cervical spine due to facet arthritis. Cord: Signal intensity within the cervical spinal cord is grossly within normal limits. No cord signal abnormality to suggest cord infarct. No abnormal cord expansion or edema. Visualized upper thoracic spinal cord within normal limits. Posterior Fossa, vertebral arteries, paraspinal tissues: Visualized brain and posterior fossa within normal limits. Craniocervical junction normal. Paraspinous soft tissues within normal limits. No abnormal prevertebral edema. Normal intravascular flow voids within the vertebral arteries grossly  maintained. Disc levels: C2-C3: Mild uncovertebral hypertrophy. Left-sided facet degeneration. Moderate left C3 foraminal stenosis. No canal or right neural foraminal narrowing. C3-C4: Bilateral uncovertebral hypertrophy without significant disc bulge. Moderate to advanced bilateral facet degeneration. No significant canal stenosis. Moderate right with mild left C4 foraminal narrowing. C4-C5: Minimal disc bulge. Mild bilateral uncovertebral hypertrophy. Advanced bilateral facet degeneration, right greater than left. No significant canal stenosis. Mild right C5 foraminal narrowing. C5-C6: Diffuse degenerative disc osteophyte. Broad posterior component flattens the ventral thecal sac. Moderate facet hypertrophy. Resultant moderate canal with severe bilateral C6 foraminal narrowing. C6-C7: Broad posterior disc bulge, eccentric to the left. Bilateral uncovertebral spurring. Flattening of the ventral CSF without significant spinal stenosis. Moderate left with mild to moderate right C7 foraminal narrowing. C7-T1: Bilateral facet hypertrophy. No significant canal or foraminal stenosis. MRI LUMBAR SPINE FINDINGS Segmentation: Normal segmentation. Lowest well-formed disc labeled the L5-S1 level. Alignment: Vertebral bodies normally aligned with preservation of the normal lumbar lordosis. No listhesis. Vertebrae: Vertebral body heights are maintained without evidence for acute or chronic fracture. Signal intensity within the visualized bone marrow is diffusely abnormal with heterogeneous and markedly decreased T1 and T2 hypointensity. Findings suspected to be related to patient's history of anemia and/or osteoporosis. 17 mm benign hemangioma noted within the L3 vertebral body. No worrisome osseous lesions. Conus medullaris and cauda equina: Conus extends to the L1 level. Conus and cauda equina appear normal. No imaging findings to suggest spinal cord infarct. No abnormal cord edema or expansion. Paraspinal and other soft  tissues: Paraspinous soft tissues demonstrate no acute abnormality. Scattered bilateral renal cysts noted, largest of which seen on the left and measures approximately 6.3 cm. Intra-abdominal aorta dilated up to 3.5 cm in diameter. This is better evaluated on recent CT from 05/28/2017. Disc levels: L1-2:  Unremarkable. L2-3: Mild diffuse disc bulge with disc desiccation. No canal or foraminal stenosis. L3-4: Minimal disc bulge with disc desiccation. Moderate facet  and ligament flavum hypertrophy. Small bilateral joint effusions. No significant canal or foraminal stenosis. L4-5: Chronic diffuse degenerative disc bulge with intervertebral disc space narrowing. Broad posterior disc protrusion indents the ventral thecal sac. Associated slight cephalad and caudad migration. Protruding disc impinges upon the descending L5 nerve roots in the lateral recesses bilaterally. Superimposed mild facet ligament flavum hypertrophy. Resultant moderate canal with severe bilateral lateral recess stenosis. Mild bilateral L4 foraminal stenosis, right greater than left. L5-S1: Negative interspace. Epidural lipomatosis. No significant canal or foraminal stenosis. IMPRESSION: MRI CERVICAL SPINE IMPRESSION 1. Normal MRI appearance of the cervical spinal cord. No evidence for cord infarct or other acute abnormality. 2. Degenerative disc osteophyte at C5-6 with resultant moderate canal and severe bilateral C6 foraminal stenosis. 3. Additional cervical spondylolysis and facet hypertrophy without significant spinal stenosis. Additional moderate multilevel foraminal narrowing as above. MRI LUMBAR SPINE IMPRESSION 1. Normal MRI appearance of the distal spinal cord/conus medullaris. No evidence for cord infarct. 2. Moderate sized central disc protrusion at C4-5 with resultant moderate canal and severe bilateral lateral recess stenosis with bilateral L5 nerve root impingement. 3. Additional fairly mild for age multilevel degenerative spondylolysis  as above. No other significant stenosis or neural impingement. 4. Markedly abnormal appearance of the visualized bone marrow, suspected to be related to patient's profound anemia and/or osteoporosis. Please note that patient was unable to tolerate the full length of the exam, and MRI of the thoracic spine was not performed. Electronically Signed   By: Jeannine Boga M.D.   On: 06/26/2017 08:13        Scheduled Meds: . aspirin  325 mg Oral Daily  . calcitRIOL  0.25 mcg Oral Q M,W,F  . carvedilol  3.125 mg Oral BID WC  . famotidine  20 mg Oral Daily  . feeding supplement (ENSURE ENLIVE)  237 mL Oral BID BM  . hydrALAZINE  10 mg Oral BID   Continuous Infusions: . piperacillin-tazobactam (ZOSYN)  IV 2.25 g (06/27/17 1330)  . [START ON 06/28/2017] vancomycin       LOS: 1 day    Time spent: > 35 minutes  Velvet Bathe, MD [facility] Pager [phone]  If 7PM-7AM, please contact night-coverage [email or web address] Password [facility] 06/27/2017, 5:14 PM

## 2017-06-27 NOTE — Progress Notes (Signed)
Nutrition Brief Note  Patient identified on the Malnutrition Screening Tool (MST) Report. Patient with some weight loss, not significant for the time frame. Weight fluctuations could be related to fluid shifts with CHF.  Nutrition focused physical exam completed.  No muscle or subcutaneous fat depletion noticed.   Wt Readings from Last 5 Encounters:  06/27/17 186 lb 8.2 oz (84.6 kg)  06/16/17 191 lb 12.8 oz (87 kg)  05/14/17 197 lb (89.4 kg)  04/29/17 196 lb (88.9 kg)  04/24/17 196 lb (88.9 kg)    Body mass index is 30.1 kg/m. Patient meets criteria for obesity, class 1 based on current BMI.   Current diet order is heart healthy. Labs and medications reviewed.   No nutrition interventions warranted at this time. If nutrition issues arise, please consult RD.   Molli Barrows, RD, LDN, [place] Pager [phone] After Hours Pager [phone]

## 2017-06-28 LAB — RESPIRATORY PANEL BY PCR
ADENOVIRUS-RVPPCR: NOT DETECTED
Bordetella pertussis: NOT DETECTED
CORONAVIRUS HKU1-RVPPCR: NOT DETECTED
CORONAVIRUS NL63-RVPPCR: NOT DETECTED
Chlamydophila pneumoniae: NOT DETECTED
Coronavirus 229E: NOT DETECTED
Coronavirus OC43: NOT DETECTED
Influenza A: NOT DETECTED
Influenza B: NOT DETECTED
METAPNEUMOVIRUS-RVPPCR: NOT DETECTED
Mycoplasma pneumoniae: NOT DETECTED
PARAINFLUENZA VIRUS 3-RVPPCR: NOT DETECTED
Parainfluenza Virus 1: NOT DETECTED
Parainfluenza Virus 2: NOT DETECTED
Parainfluenza Virus 4: NOT DETECTED
RHINOVIRUS / ENTEROVIRUS - RVPPCR: NOT DETECTED
Respiratory Syncytial Virus: NOT DETECTED

## 2017-06-28 LAB — URINE CULTURE

## 2017-06-28 LAB — GLUCOSE, CAPILLARY: GLUCOSE-CAPILLARY: 117 mg/dL — AB (ref 65–99)

## 2017-06-28 MED ORDER — CIPROFLOXACIN IN D5W 400 MG/200ML IV SOLN
400.0000 mg | INTRAVENOUS | Status: DC
  Administered 2017-06-28 – 2017-06-30 (×3): 400 mg via INTRAVENOUS
  Filled 2017-06-28 (×3): qty 200

## 2017-06-28 NOTE — Progress Notes (Signed)
PROGRESS NOTE    [name]  TKW:[id number] DOB: 1946/03/06 DOA: 06/25/2017 PCP: Hoyt Koch, MD    Brief Narrative:  72 y.o. female with medical history sig pulmonary hypertension, pericardial effusion, frequent UTI, CKD-4, dCHF, pancreatitis, s/p of AVR, s/p of repair of type A aortic dissection on 06/01/17, who presents with bilateral leg weakness, cough, increased urinary frequency  Assessment & Plan:   Principal Problem:   Leg weakness, bilateral - most likely secondary to generalized weakness. Discussed with neurosurgeon who did not find cause after evaluating MRI from his standpoint. - As such I suspect most likely secondary to sepsis and deconditioning    Sepsis ([place]) - source most likely urinary - Narrow antibiotic regmien based on sensitivities, discussed with pharmacy. Pt on  cipro  Active Problems:   Essential hypertension - Pt on carvedilol, hydralazine    Acute renal failure superimposed on stage 4 chronic kidney disease (HCC) - stable currently baseline around 3 - improved serum creatinine today.    LBBB (left bundle branch block)   Pulmonary HTN (HCC)   Chronic diastolic CHF (congestive heart failure) (HCC)   S/P AVR   Macrocytic anemia   Bilateral leg weakness   Cough    DVT prophylaxis: SCD's Code Status: Full Family Communication: none at bedside. Disposition Plan: pending improvement in condition   Consultants:   none   Procedures: none   Antimicrobials: Pt on cipro   Subjective: Pt has no new complaints. Still feeling weak  Objective: Vitals:   06/27/17 1338 06/27/17 2110 06/28/17 0450 06/28/17 1417  BP: 138/76 132/76 (!) 142/71 (!) 143/80  Pulse:  90 (!) 55 100  Resp: (!) 21 18 18  (!) 23  Temp: 98.1 F (36.7 C) 98.1 F (36.7 C) 98.7 F (37.1 C) 98.4 F (36.9 C)  TempSrc: Oral Oral Oral Oral  SpO2: 100% 98% 100% 98%  Weight:      Height:        Intake/Output Summary (Last 24 hours) at 06/28/2017 1554 Last data  filed at 06/28/2017 0649 Gross per 24 hour  Intake -  Output 850 ml  Net -850 ml   Filed Weights   06/25/17 2038 06/27/17 0528  Weight: 86.2 kg (190 lb) 84.6 kg (186 lb 8.2 oz)    Examination:  General exam: Appears calm and comfortable, in nad.  Respiratory system: Clear to auscultation. Respiratory effort normal. Equal chest rise. Cardiovascular system: S1 & S2 heard, RRR.  Gastrointestinal system: Abdomen is nondistended, soft and nontender. No organomegaly or masses felt. Normal bowel sounds heard. Central nervous system: Alert and oriented. No focal neurological deficits. Extremities: warm, + pulses Skin: No rashes, lesions or ulcers, on limited exam. Psychiatry: Mood & affect appropriate.     Data Reviewed: I have personally reviewed following labs and imaging studies  CBC: Recent Labs  Lab 06/25/17 2005 06/27/17 0454  WBC 11.7* 8.1  NEUTROABS 9.7*  --   HGB 8.0* 7.0*  HCT 27.0* 23.6*  MCV 103.8* 102.2*  PLT 289 924   Basic Metabolic Panel: Recent Labs  Lab 06/25/17 2005 06/27/17 0454  NA 144 141  K 3.9 3.6  CL 93* 97*  CO2 39* 34*  GLUCOSE 100* 99  BUN 99* 100*  CREATININE 4.27* 3.68*  CALCIUM 9.7 9.0   GFR: Estimated Creatinine Clearance: 15.4 mL/min (A) (by C-G formula based on SCr of 3.68 mg/dL (H)). Liver Function Tests: Recent Labs  Lab 06/25/17 2005  AST 54*  ALT 34  ALKPHOS 76  BILITOT 0.5  PROT 7.7  ALBUMIN 2.5*   No results for input(s): LIPASE, AMYLASE in the last 168 hours. No results for input(s): AMMONIA in the last 168 hours. Coagulation Profile: Recent Labs  Lab 06/26/17 0730  INR 1.21   Cardiac Enzymes: No results for input(s): CKTOTAL, CKMB, CKMBINDEX, TROPONINI in the last 168 hours. BNP (last 3 results) Recent Labs    11/27/16 1036  PROBNP 202.0*   HbA1C: No results for input(s): HGBA1C in the last 72 hours. CBG: Recent Labs  Lab 06/26/17 0819 06/27/17 0827 06/28/17 0742  GLUCAP 84 102* 117*   Lipid  Profile: No results for input(s): CHOL, HDL, LDLCALC, TRIG, CHOLHDL, LDLDIRECT in the last 72 hours. Thyroid Function Tests: No results for input(s): TSH, T4TOTAL, FREET4, T3FREE, THYROIDAB in the last 72 hours. Anemia Panel: No results for input(s): VITAMINB12, FOLATE, FERRITIN, TIBC, IRON, RETICCTPCT in the last 72 hours. Sepsis Labs: Recent Labs  Lab 06/25/17 2014 06/26/17 0730 06/26/17 1039  PROCALCITON  --  2.60  --   LATICACIDVEN 1.08 0.6 0.7    Recent Results (from the past 240 hour(s))  Culture, blood (routine x 2)     Status: None (Preliminary result)   Collection Time: 06/25/17  8:06 PM  Result Value Ref Range Status   Specimen Description   Final    BLOOD RIGHT ANTECUBITAL Performed at [facility] [address] [place], [place] [address]    Special Requests   Final    BOTTLES DRAWN AEROBIC AND ANAEROBIC Blood Culture adequate volume Performed at [facility] [address], [place], [place] [address]    Culture   Final    NO GROWTH 3 DAYS Performed at [facility], [place] [address] [place], [place] [address]    Report Status PENDING  Incomplete  Urine culture     Status: Abnormal   Collection Time: 06/25/17 10:44 PM  Result Value Ref Range Status   Specimen Description   Final    URINE, RANDOM Performed at [facility] [address], [place], [place] [address]    Special Requests   Final    NONE Performed at [facility], [place] [address], [place], [place] [address]    Culture >=100,000 COLONIES/mL ENTEROBACTER AEROGENES (A)  Final   Report Status 06/28/2017 FINAL  Final   Organism ID, Bacteria ENTEROBACTER AEROGENES (A)  Final      Susceptibility   Enterobacter aerogenes - MIC*    CEFAZOLIN >=64 RESISTANT Resistant     CEFTRIAXONE 32 INTERMEDIATE Intermediate     CIPROFLOXACIN <=0.25 SENSITIVE Sensitive     GENTAMICIN <=1 SENSITIVE Sensitive     IMIPENEM 1 SENSITIVE  Sensitive     NITROFURANTOIN 64 INTERMEDIATE Intermediate     TRIMETH/SULFA <=20 SENSITIVE Sensitive     PIP/TAZO >=128 RESISTANT Resistant     * >=100,000 COLONIES/mL ENTEROBACTER AEROGENES  Culture, blood (routine x 2)     Status: None (Preliminary result)   Collection Time: 06/25/17 11:40 PM  Result Value Ref Range Status   Specimen Description   Final    BLOOD LEFT ARM Performed at [facility] [address], [place], [place] [address]    Special Requests   Final    BOTTLES DRAWN AEROBIC AND ANAEROBIC Blood Culture adequate volume Performed at [facility] [address] [place], [place] [address]    Culture   Final    NO GROWTH 2 DAYS  Performed at [facility], [place] [address] [place], [place] [address]    Report Status PENDING  Incomplete     Radiology Studies: No results found.  Scheduled Meds: . aspirin  325 mg Oral Daily  . calcitRIOL  0.25 mcg Oral Q M,W,F  . carvedilol  3.125 mg Oral BID WC  . famotidine  20 mg Oral Daily  . feeding supplement (ENSURE ENLIVE)  237 mL Oral BID BM  . hydrALAZINE  10 mg Oral BID   Continuous Infusions: . ciprofloxacin Stopped (06/28/17 1351)     LOS: 2 days   Time spent: > 35 minutes  Velvet Bathe, MD [facility] Pager [phone]  If 7PM-7AM, please contact night-coverage [email or web address] Password [facility] 06/28/2017, 3:54 PM

## 2017-06-28 NOTE — Progress Notes (Signed)
Pharmacy Antibiotic Note  [name] is a 72 y.o. female admitted on 06/25/2017 with fevers/weakness, possible urosepsis.  Pharmacy has been consulted for ciprofloxacin dosing.    Patient was on vancomycin + Zosyn, but cultures came back with resistance to Zosyn and likely does not need vancomycin.  AoCKD, SCr down to 3.6.  Plan: Ciprofloxacin 400mg  IV q24h Follow clinical progression, renal function, LOT, any signs of other infections  Height: 5\' 6"  (167.6 cm) Weight: 186 lb 8.2 oz (84.6 kg) IBW/kg (Calculated) : 59.3  Temp (24hrs), Avg:98.3 F (36.8 C), Min:98.1 F (36.7 C), Max:98.7 F (37.1 C)  Recent Labs  Lab 06/25/17 2005 06/25/17 2014 06/26/17 0730 06/26/17 1039 06/27/17 0454  WBC 11.7*  --   --   --  8.1  CREATININE 4.27*  --   --   --  3.68*  LATICACIDVEN  --  1.08 0.6 0.7  --     Estimated Creatinine Clearance: 15.4 mL/min (A) (by C-G formula based on SCr of 3.68 mg/dL (H)).    Allergies  Allergen Reactions  . Minoxidil Palpitations and Other (See Comments)    unable to sleep  . Naproxen Swelling   zosyn 3/20>>3/23 vancomycin 3/20>>3/23 ciprofloxacin 3/23>>  3/20 BCx: ngtd 3/20 urine: >100K enterobacter aerogenes- R to Ancef and Zosyn, I to CTX and MacroBID 3/23 resp panel:   [name], PharmD, BCPS Clinical Pharmacist Clinical Phone for 06/28/2017 until 3:30pm: [phone] If after 3:30pm, please call main pharmacy at [phone] 06/28/2017 11:35 AM

## 2017-06-29 LAB — CBC
HCT: 23.9 % — ABNORMAL LOW (ref 36.0–46.0)
Hemoglobin: 7.3 g/dL — ABNORMAL LOW (ref 12.0–15.0)
MCH: 31.6 pg (ref 26.0–34.0)
MCHC: 30.5 g/dL (ref 30.0–36.0)
MCV: 103.5 fL — AB (ref 78.0–100.0)
PLATELETS: 235 10*3/uL (ref 150–400)
RBC: 2.31 MIL/uL — AB (ref 3.87–5.11)
RDW: 17 % — AB (ref 11.5–15.5)
WBC: 6.7 10*3/uL (ref 4.0–10.5)

## 2017-06-29 LAB — GLUCOSE, CAPILLARY: Glucose-Capillary: 101 mg/dL — ABNORMAL HIGH (ref 65–99)

## 2017-06-29 LAB — BASIC METABOLIC PANEL
Anion gap: 9 (ref 5–15)
BUN: 89 mg/dL — AB (ref 6–20)
CHLORIDE: 98 mmol/L — AB (ref 101–111)
CO2: 36 mmol/L — ABNORMAL HIGH (ref 22–32)
CREATININE: 2.96 mg/dL — AB (ref 0.44–1.00)
Calcium: 9.6 mg/dL (ref 8.9–10.3)
GFR, EST AFRICAN AMERICAN: 17 mL/min — AB (ref >60)
GFR, EST NON AFRICAN AMERICAN: 15 mL/min — AB (ref >60)
Glucose, Bld: 117 mg/dL — ABNORMAL HIGH (ref 65–99)
POTASSIUM: 3.1 mmol/L — AB (ref 3.5–5.1)
SODIUM: 143 mmol/L (ref 135–145)

## 2017-06-29 LAB — IRON AND TIBC
IRON: 33 ug/dL (ref 28–170)
Saturation Ratios: 28 % (ref 10.4–31.8)
TIBC: 119 ug/dL — ABNORMAL LOW (ref 250–450)
UIBC: 86 ug/dL

## 2017-06-29 LAB — FOLATE: FOLATE: 5.4 ng/mL — AB (ref >5.9)

## 2017-06-29 LAB — RETICULOCYTES
RBC.: 2.37 MIL/uL — AB (ref 3.87–5.11)
RETIC COUNT ABSOLUTE: 104.3 10*3/uL (ref 19.0–186.0)
Retic Ct Pct: 4.4 % — ABNORMAL HIGH (ref 0.4–3.1)

## 2017-06-29 LAB — FERRITIN: Ferritin: 668 ng/mL — ABNORMAL HIGH (ref 11–307)

## 2017-06-29 LAB — VITAMIN B12: VITAMIN B 12: 385 pg/mL (ref 180–914)

## 2017-06-29 MED ORDER — POTASSIUM CHLORIDE CRYS ER 20 MEQ PO TBCR
40.0000 meq | EXTENDED_RELEASE_TABLET | Freq: Once | ORAL | Status: AC
  Administered 2017-06-29: 40 meq via ORAL
  Filled 2017-06-29: qty 2

## 2017-06-29 NOTE — Progress Notes (Signed)
PROGRESS NOTE    [name]  ELF:[id number] DOB: [birth year] DOA: 06/25/2017 PCP: Hoyt Koch, MD    Brief Narrative:  72 y.o. female with medical history sig pulmonary hypertension, pericardial effusion, frequent UTI, CKD-4, dCHF, pancreatitis, s/p of AVR, s/p of repair of type A aortic dissection on 06/01/17, who presents with bilateral leg weakness, cough, increased urinary frequency  Assessment & Plan:   Principal Problem:   Leg weakness, bilateral - most likely secondary to generalized weakness. Discussed with neurosurgeon who did not find cause after evaluating MRI from his standpoint. - Most likely secondary to sepsis and deconditioning. Patient is currently on right antibiotic and will start in place order for physical therapy to evaluate and treat    Sepsis [facility]) - source most likely urinary - Narrow antibiotic regmien based on sensitivities, discussed with pharmacy. Will continue patient on  cipro  Active Problems:   Essential hypertension - Pt on carvedilol, hydralazine    Acute renal failure superimposed on stage 4 chronic kidney disease (HCC) - stable currently baseline around 3 - improved serum creatinine today.    LBBB (left bundle branch block)   Pulmonary HTN (HCC)   Chronic diastolic CHF (congestive heart failure) (HCC)   S/P AVR   Macrocytic anemia   Bilateral leg weakness   Cough    DVT prophylaxis: SCD's Code Status: Full Family Communication: none at bedside. Disposition Plan: pending improvement in condition   Consultants:   none   Procedures: none   Antimicrobials: Pt on cipro   Subjective: No new complaints reported  Objective: Vitals:   06/28/17 0450 06/28/17 1417 06/28/17 2144 06/29/17 0500  BP: (!) 142/71 (!) 143/80 139/73 (!) 156/80  Pulse: (!) 55 100 99 (!) 103  Resp: 18 (!) 23 18 18   Temp: 98.7 F (37.1 C) 98.4 F (36.9 C) 98.4 F (36.9 C) (!) 97.5 F (36.4 C)  TempSrc: Oral Oral Oral Oral  SpO2: 100% 98%  98% 99%  Weight:    84.6 kg (186 lb 8.2 oz)  Height:       No intake or output data in the 24 hours ending 06/29/17 1526 Filed Weights   06/25/17 2038 06/27/17 0528 06/29/17 0500  Weight: 86.2 kg (190 lb) 84.6 kg (186 lb 8.2 oz) 84.6 kg (186 lb 8.2 oz)    Examination:  General exam: Appears calm and comfortable, in nad.  Respiratory system: Clear to auscultation. Respiratory effort normal. Equal chest rise. Cardiovascular system: S1 & S2 heard, RRR.  Gastrointestinal system: Abdomen is nondistended, soft and nontender. No organomegaly or masses felt. Normal bowel sounds heard. Central nervous system: Alert and oriented. No focal neurological deficits. Extremities: warm, + pulses Skin: No rashes, lesions or ulcers, on limited exam. Psychiatry: Mood & affect appropriate.     Data Reviewed: I have personally reviewed following labs and imaging studies  CBC: Recent Labs  Lab 06/25/17 2005 06/27/17 0454 06/29/17 0657  WBC 11.7* 8.1 6.7  NEUTROABS 9.7*  --   --   HGB 8.0* 7.0* 7.3*  HCT 27.0* 23.6* 23.9*  MCV 103.8* 102.2* 103.5*  PLT 289 240 585   Basic Metabolic Panel: Recent Labs  Lab 06/25/17 2005 06/27/17 0454 06/29/17 0657  NA 144 141 143  K 3.9 3.6 3.1*  CL 93* 97* 98*  CO2 39* 34* 36*  GLUCOSE 100* 99 117*  BUN 99* 100* 89*  CREATININE 4.27* 3.68* 2.96*  CALCIUM 9.7 9.0 9.6   GFR: Estimated Creatinine Clearance: 19.1 mL/min (A) (by  C-G formula based on SCr of 2.96 mg/dL (H)). Liver Function Tests: Recent Labs  Lab 06/25/17 2005  AST 54*  ALT 34  ALKPHOS 76  BILITOT 0.5  PROT 7.7  ALBUMIN 2.5*   No results for input(s): LIPASE, AMYLASE in the last 168 hours. No results for input(s): AMMONIA in the last 168 hours. Coagulation Profile: Recent Labs  Lab 06/26/17 0730  INR 1.21   Cardiac Enzymes: No results for input(s): CKTOTAL, CKMB, CKMBINDEX, TROPONINI in the last 168 hours. BNP (last 3 results) Recent Labs    11/27/16 1036  PROBNP  202.0*   HbA1C: No results for input(s): HGBA1C in the last 72 hours. CBG: Recent Labs  Lab 06/26/17 0819 06/27/17 0827 06/28/17 0742 06/29/17 0751  GLUCAP 84 102* 117* 101*   Lipid Profile: No results for input(s): CHOL, HDL, LDLCALC, TRIG, CHOLHDL, LDLDIRECT in the last 72 hours. Thyroid Function Tests: No results for input(s): TSH, T4TOTAL, FREET4, T3FREE, THYROIDAB in the last 72 hours. Anemia Panel: Recent Labs    06/29/17 1019  VITAMINB12 385  FOLATE 5.4*  FERRITIN 668*  TIBC 119*  IRON 33  RETICCTPCT 4.4*   Sepsis Labs: Recent Labs  Lab 06/25/17 2014 06/26/17 0730 06/26/17 1039  PROCALCITON  --  2.60  --   LATICACIDVEN 1.08 0.6 0.7    Recent Results (from the past 240 hour(s))  Culture, blood (routine x 2)     Status: None (Preliminary result)   Collection Time: 06/25/17  8:06 PM  Result Value Ref Range Status   Specimen Description   Final    BLOOD RIGHT ANTECUBITAL Performed at [facility], [place] [address] [place], [place] [address]    Special Requests   Final    BOTTLES DRAWN AEROBIC AND ANAEROBIC Blood Culture adequate volume Performed at [facility] [address], [place], [place] [address]    Culture   Final    NO GROWTH 4 DAYS Performed at [facility], [place] [address] [place], [place] [address]    Report Status PENDING  Incomplete  Urine culture     Status: Abnormal   Collection Time: 06/25/17 10:44 PM  Result Value Ref Range Status   Specimen Description   Final    URINE, RANDOM Performed at [facility] [address] [place], [place] [address]    Special Requests   Final    NONE Performed at [facility], [place] [address], [place], [place] [address]    Culture >=100,000 COLONIES/mL ENTEROBACTER AEROGENES (A)  Final   Report Status 06/28/2017 FINAL  Final   Organism ID, Bacteria ENTEROBACTER AEROGENES (A)  Final      Susceptibility   Enterobacter  aerogenes - MIC*    CEFAZOLIN >=64 RESISTANT Resistant     CEFTRIAXONE 32 INTERMEDIATE Intermediate     CIPROFLOXACIN <=0.25 SENSITIVE Sensitive     GENTAMICIN <=1 SENSITIVE Sensitive     IMIPENEM 1 SENSITIVE Sensitive     NITROFURANTOIN 64 INTERMEDIATE Intermediate     TRIMETH/SULFA <=20 SENSITIVE Sensitive     PIP/TAZO >=128 RESISTANT Resistant     * >=100,000 COLONIES/mL ENTEROBACTER AEROGENES  Culture, blood (routine x 2)     Status: None (Preliminary result)   Collection Time: 06/25/17 11:40 PM  Result Value Ref Range Status   Specimen Description   Final    BLOOD LEFT ARM Performed at [facility] [address] [place], [place] [address]    Special  Requests   Final    BOTTLES DRAWN AEROBIC AND ANAEROBIC Blood Culture adequate volume Performed at [facility] [address] [place], [place] [address]    Culture   Final    NO GROWTH 3 DAYS Performed at [facility], [place] [address], [place], [place] [address]    Report Status PENDING  Incomplete  Respiratory Panel by PCR     Status: None   Collection Time: 06/28/17 10:07 AM  Result Value Ref Range Status   Adenovirus NOT DETECTED NOT DETECTED Final   Coronavirus 229E NOT DETECTED NOT DETECTED Final   Coronavirus HKU1 NOT DETECTED NOT DETECTED Final   Coronavirus NL63 NOT DETECTED NOT DETECTED Final   Coronavirus OC43 NOT DETECTED NOT DETECTED Final   Metapneumovirus NOT DETECTED NOT DETECTED Final   Rhinovirus / Enterovirus NOT DETECTED NOT DETECTED Final   Influenza A NOT DETECTED NOT DETECTED Final   Influenza B NOT DETECTED NOT DETECTED Final   Parainfluenza Virus 1 NOT DETECTED NOT DETECTED Final   Parainfluenza Virus 2 NOT DETECTED NOT DETECTED Final   Parainfluenza Virus 3 NOT DETECTED NOT DETECTED Final   Parainfluenza Virus 4 NOT DETECTED NOT DETECTED Final   Respiratory Syncytial Virus NOT DETECTED NOT DETECTED Final   Bordetella pertussis NOT DETECTED NOT  DETECTED Final   Chlamydophila pneumoniae NOT DETECTED NOT DETECTED Final   Mycoplasma pneumoniae NOT DETECTED NOT DETECTED Final    Comment: Performed at [facility], [place] [address] [place], [place] [address]     Radiology Studies: No results found.  Scheduled Meds: . aspirin  325 mg Oral Daily  . calcitRIOL  0.25 mcg Oral Q M,W,F  . carvedilol  3.125 mg Oral BID WC  . famotidine  20 mg Oral Daily  . feeding supplement (ENSURE ENLIVE)  237 mL Oral BID BM  . hydrALAZINE  10 mg Oral BID   Continuous Infusions: . ciprofloxacin Stopped (06/29/17 1400)     LOS: 3 days   Time spent: > 35 minutes  Velvet Bathe, MD [facility] Pager [phone]  If 7PM-7AM, please contact night-coverage [email or web address] Password [facility] 06/29/2017, 3:26 PM

## 2017-06-30 DIAGNOSIS — I5030 Unspecified diastolic (congestive) heart failure: Secondary | ICD-10-CM | POA: Diagnosis not present

## 2017-06-30 DIAGNOSIS — Z9981 Dependence on supplemental oxygen: Secondary | ICD-10-CM

## 2017-06-30 DIAGNOSIS — D631 Anemia in chronic kidney disease: Secondary | ICD-10-CM

## 2017-06-30 DIAGNOSIS — N184 Chronic kidney disease, stage 4 (severe): Secondary | ICD-10-CM | POA: Diagnosis not present

## 2017-06-30 DIAGNOSIS — E559 Vitamin D deficiency, unspecified: Secondary | ICD-10-CM

## 2017-06-30 DIAGNOSIS — G629 Polyneuropathy, unspecified: Secondary | ICD-10-CM

## 2017-06-30 DIAGNOSIS — Z48812 Encounter for surgical aftercare following surgery on the circulatory system: Secondary | ICD-10-CM | POA: Diagnosis not present

## 2017-06-30 DIAGNOSIS — I872 Venous insufficiency (chronic) (peripheral): Secondary | ICD-10-CM

## 2017-06-30 DIAGNOSIS — I130 Hypertensive heart and chronic kidney disease with heart failure and stage 1 through stage 4 chronic kidney disease, or unspecified chronic kidney disease: Secondary | ICD-10-CM | POA: Diagnosis not present

## 2017-06-30 DIAGNOSIS — Z87440 Personal history of urinary (tract) infections: Secondary | ICD-10-CM

## 2017-06-30 DIAGNOSIS — J90 Pleural effusion, not elsewhere classified: Secondary | ICD-10-CM

## 2017-06-30 DIAGNOSIS — I272 Pulmonary hypertension, unspecified: Secondary | ICD-10-CM

## 2017-06-30 DIAGNOSIS — Z952 Presence of prosthetic heart valve: Secondary | ICD-10-CM

## 2017-06-30 DIAGNOSIS — M1991 Primary osteoarthritis, unspecified site: Secondary | ICD-10-CM

## 2017-06-30 LAB — CBC
HEMATOCRIT: 25.3 % — AB (ref 36.0–46.0)
HEMOGLOBIN: 7.6 g/dL — AB (ref 12.0–15.0)
MCH: 30.8 pg (ref 26.0–34.0)
MCHC: 30 g/dL (ref 30.0–36.0)
MCV: 102.4 fL — ABNORMAL HIGH (ref 78.0–100.0)
Platelets: 275 10*3/uL (ref 150–400)
RBC: 2.47 MIL/uL — AB (ref 3.87–5.11)
RDW: 16.7 % — ABNORMAL HIGH (ref 11.5–15.5)
WBC: 6.8 10*3/uL (ref 4.0–10.5)

## 2017-06-30 LAB — CULTURE, BLOOD (ROUTINE X 2)
Culture: NO GROWTH
Special Requests: ADEQUATE

## 2017-06-30 LAB — GLUCOSE, CAPILLARY: GLUCOSE-CAPILLARY: 109 mg/dL — AB (ref 65–99)

## 2017-06-30 MED ORDER — FOSFOMYCIN TROMETHAMINE 3 G PO PACK
3.0000 g | PACK | Freq: Once | ORAL | Status: AC
  Administered 2017-06-30: 3 g via ORAL
  Filled 2017-06-30: qty 3

## 2017-06-30 NOTE — Telephone Encounter (Signed)
Noted  

## 2017-06-30 NOTE — Evaluation (Signed)
Physical Therapy Evaluation Patient Details Name: [name] MRN: [id number] DOB: 1946-01-04 Today's Date: 06/30/2017   History of Present Illness  72 y.o. female with medical history sig pulmonary hypertension, pericardial effusion, frequent UTI, CKD-4, dCHF, pancreatitis, s/p of AVR, s/p of repair of type A aortic dissection on 06/01/17, who presents with bilateral leg weakness, cough, increased urinary frequency  Clinical Impression  Patient presents with decreased independence with mobility due to deficits listed in PT problem list.  She currently is unable to ambulate more than a few feet due to pain in L knee.  On two attempts had great difficulty moving R leg forward due to weight bearing on L.  Feel she may need SNF level rehab prior to d/c home with spouse assist.  PT to follow acutely.    Follow Up Recommendations SNF;Supervision/Assistance - 24 hour    Equipment Recommendations  Other (comment)(rollator)    Recommendations for Other Services       Precautions / Restrictions Precautions Precautions: Sternal;Fall Restrictions Weight Bearing Restrictions: No      Mobility  Bed Mobility               General bed mobility comments: OOB in chair  Transfers Overall transfer level: Needs assistance Equipment used: Rolling walker (2 wheeled) Transfers: Sit to/from Stand Sit to Stand: Min assist Stand pivot transfers: Min assist       General transfer comment: to [facility] with RW assist due to painful L knee with weight bearing  Ambulation/Gait Ambulation/Gait assistance: Min assist Ambulation Distance (Feet): 6 Feet Assistive device: Rolling walker (2 wheeled);4-wheeled walker Gait Pattern/deviations: Step-to pattern;Trunk flexed;Antalgic     General Gait Details: pain with weight on L LE during stance so increased time to take a step with R foot, attempted twice and with rollator, but pt unable to tolerate walking   Stairs            Wheelchair  Mobility    Modified Rankin (Stroke Patients Only)       Balance Overall balance assessment: Needs assistance;Mild deficits observed, not formally tested;Modified Independent Sitting-balance support: No upper extremity supported;Feet supported Sitting balance-Leahy Scale: Good       Standing balance-Leahy Scale: Poor Standing balance comment: UE support for balancce                             Pertinent Vitals/Pain Pain Assessment: 0-10 Pain Score: 8  Pain Location: L knee with attempts at ambulation Pain Descriptors / Indicators: Sore;Sharp Pain Intervention(s): Monitored during session;Repositioned;Ice applied    Home Living Family/patient expects to be discharged to:: Private residence Living Arrangements: Spouse/significant other Available Help at Discharge: Family;Available 24 hours/day Type of Home: House Home Access: Stairs to enter Entrance Stairs-Rails: None Entrance Stairs-Number of Steps: 1 Home Layout: One level Home Equipment: Walker - 2 wheels;Bedside commode      Prior Function Level of Independence: Needs assistance   Gait / Transfers Assistance Needed: since going home has needed help for some transfers; recently difficulty walking due to pain in L knee and had R sided weakness  ADL's / Homemaking Assistance Needed: spouse assisted with sponge bathing        Hand Dominance   Dominant Hand: Right    Extremity/Trunk Assessment        Lower Extremity Assessment Lower Extremity Assessment: RLE deficits/detail;LLE deficits/detail RLE Deficits / Details: grossly WFL AROM, strength at least 4/5 LLE Deficits / Details: AROM WFL except  unable to fully extend knee due to pain, strength at least 3+/5    Cervical / Trunk Assessment Cervical / Trunk Assessment: Kyphotic  Communication   Communication: No difficulties  Cognition Arousal/Alertness: Awake/alert Behavior During Therapy: WFL for tasks assessed/performed Overall Cognitive  Status: Within Functional Limits for tasks assessed                                        General Comments General comments (skin integrity, edema, etc.): SpO2 91% on 2L O2    Exercises Other Exercises Other Exercises: seated knee flexion L LE several reps for improving ROM   Assessment/Plan    PT Assessment Patient needs continued PT services  PT Problem List Decreased strength;Decreased mobility;Decreased activity tolerance;Decreased balance;Decreased range of motion;Pain;Decreased knowledge of use of DME       PT Treatment Interventions DME instruction;Therapeutic activities;Gait training;Balance training;Functional mobility training;Therapeutic exercise;Patient/family education    PT Goals (Current goals can be found in the Care Plan section)  Acute Rehab PT Goals Patient Stated Goal: be able to walk PT Goal Formulation: With patient/family Time For Goal Achievement: 07/14/17 Potential to Achieve Goals: Good    Frequency Min 3X/week   Barriers to discharge        Co-evaluation               AM-PAC PT "6 Clicks" Daily Activity  Outcome Measure Difficulty turning over in bed (including adjusting bedclothes, sheets and blankets)?: Unable Difficulty moving from lying on back to sitting on the side of the bed? : Unable Difficulty sitting down on and standing up from a chair with arms (e.g., wheelchair, bedside commode, etc,.)?: Unable Help needed moving to and from a bed to chair (including a wheelchair)?: A Little Help needed walking in hospital room?: A Little Help needed climbing 3-5 steps with a railing? : A Lot 6 Click Score: 11    End of Session Equipment Utilized During Treatment: Gait belt;Oxygen Activity Tolerance: Patient limited by pain Patient left: with call bell/phone within reach;in chair;with family/visitor present   PT Visit Diagnosis: Other abnormalities of gait and mobility (R26.89);Difficulty in walking, not elsewhere  classified (R26.2);Pain Pain - Right/Left: Left Pain - part of body: Knee    Time: 1000-1045 PT Time Calculation (min) (ACUTE ONLY): 45 min   Charges:   PT Evaluation $PT Eval Moderate Complexity: 1 Mod PT Treatments $Gait Training: 8-22 mins $Therapeutic Activity: 8-22 mins   PT G CodesMagda Kiel, [place] [phone] 06/30/2017   Reginia Naas 06/30/2017, 1:47 PM

## 2017-06-30 NOTE — Discharge Summary (Signed)
Physician Discharge Summary  [name] EAV:[id number] DOB: Mar 10, 1946 DOA: 06/25/2017  PCP: Hoyt Koch, MD  Admit date: 06/25/2017 Discharge date: 06/30/2017  Time spent: > 35 minutes  Recommendations for Outpatient Follow-up:  1. Patient had 3 days of Cipro and 1 day fosfomycin which covers complicated UTI   Discharge Diagnoses:  Principal Problem:   Leg weakness, bilateral Active Problems:   Essential hypertension   Acute renal failure superimposed on stage 4 chronic kidney disease (HCC)   Acute lower UTI   LBBB (left bundle branch block)   Pulmonary HTN (HCC)   Chronic diastolic CHF (congestive heart failure) (HCC)   S/P AVR   Sepsis (HCC)   Macrocytic anemia   Bilateral leg weakness   Cough   Discharge Condition: Stable  Diet recommendation: Heart healthy  Filed Weights   06/27/17 0528 06/29/17 0500 06/30/17 0500  Weight: 84.6 kg (186 lb 8.2 oz) 84.6 kg (186 lb 8.2 oz) 84.6 kg (186 lb 8.2 oz)    History of present illness:  72 y.o.femalewith medical history sigpulmonary hypertension, pericardial effusion, frequent UTI, CKD-4, dCHF, pancreatitis, s/p of AVR, s/p of repair of type A aortic dissection on 06/01/17, who presents with bilateral leg weakness, cough, increased urinary frequency  Hospital Course:  Principal Problem:   Leg weakness, bilateral - Most likely secondary to sepsis and deconditioning. Patient is currently on right antibiotic and will start in place order for physical therapy to evaluate and treat - Physical therapy recommending SNF    Sepsis ([place]) - source most likely urinary - urine culture growing [employer] patient was treated on 3 days of Cipro and will obtain fosfomycin prior to discharge  Active Problems:   Essential hypertension - Pt on carvedilol, hydralazine    Acute renal failure superimposed on stage 4 chronic kidney disease ([place]) - stable currently baseline around 3 - improved serum creatinine  today.    LBBB (left bundle branch block)   Pulmonary HTN (HCC)   Chronic diastolic CHF (congestive heart failure) (HCC)   S/P AVR   Macrocytic anemia   Bilateral leg weakness   Cough    Procedures:  none  Consultations:  None  Discharge Exam: Vitals:   06/30/17 0846 06/30/17 1422  BP: (!) 153/80 (!) 141/69  Pulse: 92 99  Resp:  (!) 21  Temp:  (!) 97.4 F (36.3 C)  SpO2:  99%    General: Pt in nad, alert and awake Cardiovascular: rrr, no rubs Respiratory: no increased wob, no wheezes  Discharge Instructions   Discharge Instructions    Call MD for:  extreme fatigue   Complete by:  As directed    Call MD for:  severe uncontrolled pain   Complete by:  As directed    Call MD for:  temperature >100.4   Complete by:  As directed    Diet - low sodium heart healthy   Complete by:  As directed    Discharge instructions   Complete by:  As directed    Please follow up with your primary care physician in the next 1-2 weeks or sooner should any new concerns arise   Increase activity slowly   Complete by:  As directed      Allergies as of 06/30/2017      Reactions   Minoxidil Palpitations, Other (See Comments)   unable to sleep   Naproxen Swelling      Medication List    TAKE these medications   aspirin 325 MG EC tablet  Take 1 tablet (325 mg total) by mouth daily.   calcitRIOL 0.25 MCG capsule Commonly known as:  ROCALTROL Take 1 capsule (0.25 mcg total) by mouth every other day. Please take every Monday, Wednesday, Friday   calcium carbonate 500 MG chewable tablet Commonly known as:  TUMS - dosed in mg elemental calcium Chew 2 tablets by mouth daily as needed for indigestion or heartburn.   carvedilol 3.125 MG tablet Commonly known as:  COREG Take 1 tablet (3.125 mg total) by mouth 2 (two) times daily with a meal.   FAMOTIDINE PO Take 1 tablet by mouth 2 (two) times daily as needed (gas).   fluticasone 50 MCG/ACT nasal spray Commonly known as:   FLONASE Place 2 sprays daily into both nostrils. What changed:    when to take this  reasons to take this   furosemide 80 MG tablet Commonly known as:  LASIX Take 2 tablets (160 mg total) by mouth 2 (two) times daily.   gabapentin 300 MG capsule Commonly known as:  NEURONTIN TAKE 1 CAPSULE(300 MG) BY MOUTH THREE TIMES DAILY AS NEEDED FOR PAIN   hydrALAZINE 10 MG tablet Commonly known as:  APRESOLINE Take 1 tablet (10 mg total) by mouth 2 (two) times daily.   hydrocortisone 2.5 % rectal cream Commonly known as:  ANUSOL-HC Place 1 application rectally as needed. What changed:    when to take this  reasons to take this   ondansetron 4 MG tablet Commonly known as:  ZOFRAN Take 1 tablet (4 mg total) by mouth every 8 (eight) hours as needed for nausea or vomiting.   pantoprazole 40 MG tablet Commonly known as:  PROTONIX Take 1 tablet (40 mg total) daily by mouth. What changed:    when to take this  reasons to take this   SYSTANE BALANCE 0.6 % Soln Generic drug:  Propylene Glycol Place 1 drop into both eyes daily as needed (for dry eyes).   traMADol 50 MG tablet Commonly known as:  ULTRAM Take 1 tablet (50 mg total) by mouth every 12 (twelve) hours as needed for severe pain.   triamcinolone ointment 0.5 % Commonly known as:  KENALOG Apply 1 application topically daily as needed (itching).      Allergies  Allergen Reactions  . Minoxidil Palpitations and Other (See Comments)    unable to sleep  . Naproxen Swelling      The results of significant diagnostics from this hospitalization (including imaging, microbiology, ancillary and laboratory) are listed below for reference.    Significant Diagnostic Studies: Dg Chest 1 View  Result Date: 06/12/2017 CLINICAL DATA:  Left-sided thoracentesis. EXAM: CHEST 1 VIEW COMPARISON:  Chest x-ray from same day at 7:04 a.m. FINDINGS: Stable cardiomegaly status post aortic valve replacement. Normal pulmonary vascularity.  Slight interval decrease in size of small to moderate left pleural effusion. Unchanged small right pleural effusion. Left-greater-than-right basilar atelectasis again noted. No pneumothorax. No acute osseous abnormality. IMPRESSION: 1. Slight interval decrease in size of small to moderate left pleural effusion. No pneumothorax. 2. Unchanged small right pleural effusion with adjacent atelectasis. Electronically Signed   By: Titus Dubin M.D.   On: 06/12/2017 10:44   Dg Chest 1 View  Result Date: 06/11/2017 CLINICAL DATA:  Status post right thoracentesis. EXAM: CHEST 1 VIEW COMPARISON:  06/10/2017 FINDINGS: Cardiac shadow remains enlarged. Postsurgical changes are again noted. Left basilar changes are again seen with associated effusion. Previously seen right pleural effusion has resolved in the interval. Minimal residual atelectasis is  noted. No sizable pneumothorax is seen. No new focal abnormality is noted. IMPRESSION: No pneumothorax following thoracentesis. Electronically Signed   By: Inez Catalina M.D.   On: 06/11/2017 14:54   Dg Chest 2 View  Result Date: 06/25/2017 CLINICAL DATA:  Fever and shortness of breath. EXAM: CHEST - 2 VIEW COMPARISON:  PA and lateral chest 06/16/2017. Single-view of the chest 06/12/2017. CT chest 05/28/2017. FINDINGS: Left worse than right pleural effusions and airspace disease are not notably changed since the most recent examination. There is cardiomegaly without edema. No pneumothorax. Surgical clips right axilla noted. The patient is status post aortic valve replacement. IMPRESSION: No marked change in left greater than right pleural effusions and basilar airspace disease since the most recent exam. Cardiomegaly. Electronically Signed   By: Inge Rise M.D.   On: 06/25/2017 20:53   Dg Chest 2 View  Result Date: 06/16/2017 CLINICAL DATA:  Pleural effusion. EXAM: CHEST - 2 VIEW COMPARISON:  06/12/2017 FINDINGS: Sequelae of aortic valve replacement are again  identified. The cardiac silhouette remains mildly enlarged. Small pleural effusions, left larger than right, are stable to slightly decreased in size. Left greater than right basilar airspace opacities have also improved. No pneumothorax is identified. IMPRESSION: Mildly improved aeration of the lung bases with stable to slightly decreased size of small pleural effusions. Electronically Signed   By: Logan Bores M.D.   On: 06/16/2017 07:40   Dg Chest 2 View  Result Date: 06/12/2017 CLINICAL DATA:  Pleural effusion. EXAM: CHEST - 2 VIEW COMPARISON:  06/11/2017 FINDINGS: Sequelae of aortic valve replacement are again identified. The cardiac silhouette remains enlarged. A small to moderate-sized left pleural effusion with associated left basilar atelectasis or consolidation has not significantly changed from yesterday's study though has increased from 06/10/2017. There is also a small right pleural effusion which appears slightly larger than on yesterday's study with increasing patchy right basilar airspace opacity. No pneumothorax is identified. IMPRESSION: 1. Small right pleural effusion with increasing right basilar atelectasis. 2. Persistent small to moderate-sized left pleural effusion with left basilar atelectasis or consolidation. Electronically Signed   By: Logan Bores M.D.   On: 06/12/2017 09:50   Dg Chest 2 View  Result Date: 06/10/2017 CLINICAL DATA:  Status post aortic valve repair EXAM: CHEST  2 VIEW COMPARISON:  06/09/2017 FINDINGS: Prior aortic valve replacement. Cardiomegaly with vascular congestion. Small bilateral pleural effusions with bibasilar atelectasis. IMPRESSION: Cardiomegaly, vascular congestion. Small bilateral effusions with bibasilar atelectasis. No real change since prior study. Electronically Signed   By: Rolm Baptise M.D.   On: 06/10/2017 08:01   Dg Chest 2 View  Result Date: 06/04/2017 CLINICAL DATA:  Status post aortic valve replacement. EXAM: CHEST  2 VIEW COMPARISON:   Radiograph of June 03, 2017. FINDINGS: Stable cardiomegaly. Left subclavian catheter is unchanged in position. Aortic valve prosthesis is noted. No pneumothorax is noted. Stable bibasilar opacities are noted most consistent with atelectasis with associated pleural effusions. Bony thorax is unremarkable. IMPRESSION: Stable bibasilar subsegmental atelectasis is noted with associated pleural effusions. Electronically Signed   By: Marijo Conception, M.D.   On: 06/04/2017 10:25   Mr Cervical Spine Wo Contrast  Result Date: 06/26/2017 CLINICAL DATA:  Initial evaluation for bilateral lower extremity weakness, history of recent thoracic aortic dissection repair. EXAM: MRI CERVICAL AND LUMBAR SPINE WITHOUT CONTRAST TECHNIQUE: Multiplanar and multiecho pulse sequences of the cervical spine, to include the craniocervical junction and cervicothoracic junction, and lumbar spine, were obtained without intravenous contrast. Please note  that 80 MRI of the thoracic spine was ordered as well, however, patient was unable to tolerate the full length of the exam. COMPARISON:  None. FINDINGS: MRI CERVICAL SPINE FINDINGS Alignment: Examination technically limited by motion artifact and patient positioning. Straightening of the normal mid and lower cervical lordosis. No discernible listhesis or malalignment. Vertebrae: Vertebral body heights are grossly maintained without evidence for acute or chronic fracture. Signal intensity within the visualized bone marrow is diffusely abnormal with heterogeneous appearance and markedly decreased T1 and T2 signal intensity findings suspected to be related to patient's anemia with possible osteoporotic changes. No worrisome osseous lesions. Probable subcentimeter benign hemangioma noted within the T5 vertebral body. No other discrete osseous lesions. Mild reactive edema noted about a few facets within the cervical spine due to facet arthritis. Cord: Signal intensity within the cervical spinal  cord is grossly within normal limits. No cord signal abnormality to suggest cord infarct. No abnormal cord expansion or edema. Visualized upper thoracic spinal cord within normal limits. Posterior Fossa, vertebral arteries, paraspinal tissues: Visualized brain and posterior fossa within normal limits. Craniocervical junction normal. Paraspinous soft tissues within normal limits. No abnormal prevertebral edema. Normal intravascular flow voids within the vertebral arteries grossly maintained. Disc levels: C2-C3: Mild uncovertebral hypertrophy. Left-sided facet degeneration. Moderate left C3 foraminal stenosis. No canal or right neural foraminal narrowing. C3-C4: Bilateral uncovertebral hypertrophy without significant disc bulge. Moderate to advanced bilateral facet degeneration. No significant canal stenosis. Moderate right with mild left C4 foraminal narrowing. C4-C5: Minimal disc bulge. Mild bilateral uncovertebral hypertrophy. Advanced bilateral facet degeneration, right greater than left. No significant canal stenosis. Mild right C5 foraminal narrowing. C5-C6: Diffuse degenerative disc osteophyte. Broad posterior component flattens the ventral thecal sac. Moderate facet hypertrophy. Resultant moderate canal with severe bilateral C6 foraminal narrowing. C6-C7: Broad posterior disc bulge, eccentric to the left. Bilateral uncovertebral spurring. Flattening of the ventral CSF without significant spinal stenosis. Moderate left with mild to moderate right C7 foraminal narrowing. C7-T1: Bilateral facet hypertrophy. No significant canal or foraminal stenosis. MRI LUMBAR SPINE FINDINGS Segmentation: Normal segmentation. Lowest well-formed disc labeled the L5-S1 level. Alignment: Vertebral bodies normally aligned with preservation of the normal lumbar lordosis. No listhesis. Vertebrae: Vertebral body heights are maintained without evidence for acute or chronic fracture. Signal intensity within the visualized bone marrow is  diffusely abnormal with heterogeneous and markedly decreased T1 and T2 hypointensity. Findings suspected to be related to patient's history of anemia and/or osteoporosis. 17 mm benign hemangioma noted within the L3 vertebral body. No worrisome osseous lesions. Conus medullaris and cauda equina: Conus extends to the L1 level. Conus and cauda equina appear normal. No imaging findings to suggest spinal cord infarct. No abnormal cord edema or expansion. Paraspinal and other soft tissues: Paraspinous soft tissues demonstrate no acute abnormality. Scattered bilateral renal cysts noted, largest of which seen on the left and measures approximately 6.3 cm. Intra-abdominal aorta dilated up to 3.5 cm in diameter. This is better evaluated on recent CT from 05/28/2017. Disc levels: L1-2:  Unremarkable. L2-3: Mild diffuse disc bulge with disc desiccation. No canal or foraminal stenosis. L3-4: Minimal disc bulge with disc desiccation. Moderate facet and ligament flavum hypertrophy. Small bilateral joint effusions. No significant canal or foraminal stenosis. L4-5: Chronic diffuse degenerative disc bulge with intervertebral disc space narrowing. Broad posterior disc protrusion indents the ventral thecal sac. Associated slight cephalad and caudad migration. Protruding disc impinges upon the descending L5 nerve roots in the lateral recesses bilaterally. Superimposed mild facet ligament flavum hypertrophy.  Resultant moderate canal with severe bilateral lateral recess stenosis. Mild bilateral L4 foraminal stenosis, right greater than left. L5-S1: Negative interspace. Epidural lipomatosis. No significant canal or foraminal stenosis. IMPRESSION: MRI CERVICAL SPINE IMPRESSION 1. Normal MRI appearance of the cervical spinal cord. No evidence for cord infarct or other acute abnormality. 2. Degenerative disc osteophyte at C5-6 with resultant moderate canal and severe bilateral C6 foraminal stenosis. 3. Additional cervical spondylolysis and  facet hypertrophy without significant spinal stenosis. Additional moderate multilevel foraminal narrowing as above. MRI LUMBAR SPINE IMPRESSION 1. Normal MRI appearance of the distal spinal cord/conus medullaris. No evidence for cord infarct. 2. Moderate sized central disc protrusion at C4-5 with resultant moderate canal and severe bilateral lateral recess stenosis with bilateral L5 nerve root impingement. 3. Additional fairly mild for age multilevel degenerative spondylolysis as above. No other significant stenosis or neural impingement. 4. Markedly abnormal appearance of the visualized bone marrow, suspected to be related to patient's profound anemia and/or osteoporosis. Please note that patient was unable to tolerate the full length of the exam, and MRI of the thoracic spine was not performed. Electronically Signed   By: Jeannine Boga M.D.   On: 06/26/2017 08:13   Mr Thoracic Spine Wo Contrast  Result Date: 06/26/2017 CLINICAL DATA:  Initial evaluation for bilateral lower extremity weakness, history of recent thoracic aortic dissection repair. EXAM: MRI CERVICAL AND LUMBAR SPINE WITHOUT CONTRAST TECHNIQUE: Multiplanar and multiecho pulse sequences of the cervical spine, to include the craniocervical junction and cervicothoracic junction, and lumbar spine, were obtained without intravenous contrast. Please note that 80 MRI of the thoracic spine was ordered as well, however, patient was unable to tolerate the full length of the exam. COMPARISON:  None. FINDINGS: MRI CERVICAL SPINE FINDINGS Alignment: Examination technically limited by motion artifact and patient positioning. Straightening of the normal mid and lower cervical lordosis. No discernible listhesis or malalignment. Vertebrae: Vertebral body heights are grossly maintained without evidence for acute or chronic fracture. Signal intensity within the visualized bone marrow is diffusely abnormal with heterogeneous appearance and markedly decreased  T1 and T2 signal intensity findings suspected to be related to patient's anemia with possible osteoporotic changes. No worrisome osseous lesions. Probable subcentimeter benign hemangioma noted within the T5 vertebral body. No other discrete osseous lesions. Mild reactive edema noted about a few facets within the cervical spine due to facet arthritis. Cord: Signal intensity within the cervical spinal cord is grossly within normal limits. No cord signal abnormality to suggest cord infarct. No abnormal cord expansion or edema. Visualized upper thoracic spinal cord within normal limits. Posterior Fossa, vertebral arteries, paraspinal tissues: Visualized brain and posterior fossa within normal limits. Craniocervical junction normal. Paraspinous soft tissues within normal limits. No abnormal prevertebral edema. Normal intravascular flow voids within the vertebral arteries grossly maintained. Disc levels: C2-C3: Mild uncovertebral hypertrophy. Left-sided facet degeneration. Moderate left C3 foraminal stenosis. No canal or right neural foraminal narrowing. C3-C4: Bilateral uncovertebral hypertrophy without significant disc bulge. Moderate to advanced bilateral facet degeneration. No significant canal stenosis. Moderate right with mild left C4 foraminal narrowing. C4-C5: Minimal disc bulge. Mild bilateral uncovertebral hypertrophy. Advanced bilateral facet degeneration, right greater than left. No significant canal stenosis. Mild right C5 foraminal narrowing. C5-C6: Diffuse degenerative disc osteophyte. Broad posterior component flattens the ventral thecal sac. Moderate facet hypertrophy. Resultant moderate canal with severe bilateral C6 foraminal narrowing. C6-C7: Broad posterior disc bulge, eccentric to the left. Bilateral uncovertebral spurring. Flattening of the ventral CSF without significant spinal stenosis. Moderate left with mild  to moderate right C7 foraminal narrowing. C7-T1: Bilateral facet hypertrophy. No  significant canal or foraminal stenosis. MRI LUMBAR SPINE FINDINGS Segmentation: Normal segmentation. Lowest well-formed disc labeled the L5-S1 level. Alignment: Vertebral bodies normally aligned with preservation of the normal lumbar lordosis. No listhesis. Vertebrae: Vertebral body heights are maintained without evidence for acute or chronic fracture. Signal intensity within the visualized bone marrow is diffusely abnormal with heterogeneous and markedly decreased T1 and T2 hypointensity. Findings suspected to be related to patient's history of anemia and/or osteoporosis. 17 mm benign hemangioma noted within the L3 vertebral body. No worrisome osseous lesions. Conus medullaris and cauda equina: Conus extends to the L1 level. Conus and cauda equina appear normal. No imaging findings to suggest spinal cord infarct. No abnormal cord edema or expansion. Paraspinal and other soft tissues: Paraspinous soft tissues demonstrate no acute abnormality. Scattered bilateral renal cysts noted, largest of which seen on the left and measures approximately 6.3 cm. Intra-abdominal aorta dilated up to 3.5 cm in diameter. This is better evaluated on recent CT from 05/28/2017. Disc levels: L1-2:  Unremarkable. L2-3: Mild diffuse disc bulge with disc desiccation. No canal or foraminal stenosis. L3-4: Minimal disc bulge with disc desiccation. Moderate facet and ligament flavum hypertrophy. Small bilateral joint effusions. No significant canal or foraminal stenosis. L4-5: Chronic diffuse degenerative disc bulge with intervertebral disc space narrowing. Broad posterior disc protrusion indents the ventral thecal sac. Associated slight cephalad and caudad migration. Protruding disc impinges upon the descending L5 nerve roots in the lateral recesses bilaterally. Superimposed mild facet ligament flavum hypertrophy. Resultant moderate canal with severe bilateral lateral recess stenosis. Mild bilateral L4 foraminal stenosis, right greater than  left. L5-S1: Negative interspace. Epidural lipomatosis. No significant canal or foraminal stenosis. IMPRESSION: MRI CERVICAL SPINE IMPRESSION 1. Normal MRI appearance of the cervical spinal cord. No evidence for cord infarct or other acute abnormality. 2. Degenerative disc osteophyte at C5-6 with resultant moderate canal and severe bilateral C6 foraminal stenosis. 3. Additional cervical spondylolysis and facet hypertrophy without significant spinal stenosis. Additional moderate multilevel foraminal narrowing as above. MRI LUMBAR SPINE IMPRESSION 1. Normal MRI appearance of the distal spinal cord/conus medullaris. No evidence for cord infarct. 2. Moderate sized central disc protrusion at C4-5 with resultant moderate canal and severe bilateral lateral recess stenosis with bilateral L5 nerve root impingement. 3. Additional fairly mild for age multilevel degenerative spondylolysis as above. No other significant stenosis or neural impingement. 4. Markedly abnormal appearance of the visualized bone marrow, suspected to be related to patient's profound anemia and/or osteoporosis. Please note that patient was unable to tolerate the full length of the exam, and MRI of the thoracic spine was not performed. Electronically Signed   By: Jeannine Boga M.D.   On: 06/26/2017 08:13   Mr Lumbar Spine Wo Contrast  Result Date: 06/26/2017 CLINICAL DATA:  Initial evaluation for bilateral lower extremity weakness, history of recent thoracic aortic dissection repair. EXAM: MRI CERVICAL AND LUMBAR SPINE WITHOUT CONTRAST TECHNIQUE: Multiplanar and multiecho pulse sequences of the cervical spine, to include the craniocervical junction and cervicothoracic junction, and lumbar spine, were obtained without intravenous contrast. Please note that 80 MRI of the thoracic spine was ordered as well, however, patient was unable to tolerate the full length of the exam. COMPARISON:  None. FINDINGS: MRI CERVICAL SPINE FINDINGS Alignment:  Examination technically limited by motion artifact and patient positioning. Straightening of the normal mid and lower cervical lordosis. No discernible listhesis or malalignment. Vertebrae: Vertebral body heights are grossly maintained without  evidence for acute or chronic fracture. Signal intensity within the visualized bone marrow is diffusely abnormal with heterogeneous appearance and markedly decreased T1 and T2 signal intensity findings suspected to be related to patient's anemia with possible osteoporotic changes. No worrisome osseous lesions. Probable subcentimeter benign hemangioma noted within the T5 vertebral body. No other discrete osseous lesions. Mild reactive edema noted about a few facets within the cervical spine due to facet arthritis. Cord: Signal intensity within the cervical spinal cord is grossly within normal limits. No cord signal abnormality to suggest cord infarct. No abnormal cord expansion or edema. Visualized upper thoracic spinal cord within normal limits. Posterior Fossa, vertebral arteries, paraspinal tissues: Visualized brain and posterior fossa within normal limits. Craniocervical junction normal. Paraspinous soft tissues within normal limits. No abnormal prevertebral edema. Normal intravascular flow voids within the vertebral arteries grossly maintained. Disc levels: C2-C3: Mild uncovertebral hypertrophy. Left-sided facet degeneration. Moderate left C3 foraminal stenosis. No canal or right neural foraminal narrowing. C3-C4: Bilateral uncovertebral hypertrophy without significant disc bulge. Moderate to advanced bilateral facet degeneration. No significant canal stenosis. Moderate right with mild left C4 foraminal narrowing. C4-C5: Minimal disc bulge. Mild bilateral uncovertebral hypertrophy. Advanced bilateral facet degeneration, right greater than left. No significant canal stenosis. Mild right C5 foraminal narrowing. C5-C6: Diffuse degenerative disc osteophyte. Broad posterior  component flattens the ventral thecal sac. Moderate facet hypertrophy. Resultant moderate canal with severe bilateral C6 foraminal narrowing. C6-C7: Broad posterior disc bulge, eccentric to the left. Bilateral uncovertebral spurring. Flattening of the ventral CSF without significant spinal stenosis. Moderate left with mild to moderate right C7 foraminal narrowing. C7-T1: Bilateral facet hypertrophy. No significant canal or foraminal stenosis. MRI LUMBAR SPINE FINDINGS Segmentation: Normal segmentation. Lowest well-formed disc labeled the L5-S1 level. Alignment: Vertebral bodies normally aligned with preservation of the normal lumbar lordosis. No listhesis. Vertebrae: Vertebral body heights are maintained without evidence for acute or chronic fracture. Signal intensity within the visualized bone marrow is diffusely abnormal with heterogeneous and markedly decreased T1 and T2 hypointensity. Findings suspected to be related to patient's history of anemia and/or osteoporosis. 17 mm benign hemangioma noted within the L3 vertebral body. No worrisome osseous lesions. Conus medullaris and cauda equina: Conus extends to the L1 level. Conus and cauda equina appear normal. No imaging findings to suggest spinal cord infarct. No abnormal cord edema or expansion. Paraspinal and other soft tissues: Paraspinous soft tissues demonstrate no acute abnormality. Scattered bilateral renal cysts noted, largest of which seen on the left and measures approximately 6.3 cm. Intra-abdominal aorta dilated up to 3.5 cm in diameter. This is better evaluated on recent CT from 05/28/2017. Disc levels: L1-2:  Unremarkable. L2-3: Mild diffuse disc bulge with disc desiccation. No canal or foraminal stenosis. L3-4: Minimal disc bulge with disc desiccation. Moderate facet and ligament flavum hypertrophy. Small bilateral joint effusions. No significant canal or foraminal stenosis. L4-5: Chronic diffuse degenerative disc bulge with intervertebral disc  space narrowing. Broad posterior disc protrusion indents the ventral thecal sac. Associated slight cephalad and caudad migration. Protruding disc impinges upon the descending L5 nerve roots in the lateral recesses bilaterally. Superimposed mild facet ligament flavum hypertrophy. Resultant moderate canal with severe bilateral lateral recess stenosis. Mild bilateral L4 foraminal stenosis, right greater than left. L5-S1: Negative interspace. Epidural lipomatosis. No significant canal or foraminal stenosis. IMPRESSION: MRI CERVICAL SPINE IMPRESSION 1. Normal MRI appearance of the cervical spinal cord. No evidence for cord infarct or other acute abnormality. 2. Degenerative disc osteophyte at C5-6 with resultant moderate canal and  severe bilateral C6 foraminal stenosis. 3. Additional cervical spondylolysis and facet hypertrophy without significant spinal stenosis. Additional moderate multilevel foraminal narrowing as above. MRI LUMBAR SPINE IMPRESSION 1. Normal MRI appearance of the distal spinal cord/conus medullaris. No evidence for cord infarct. 2. Moderate sized central disc protrusion at C4-5 with resultant moderate canal and severe bilateral lateral recess stenosis with bilateral L5 nerve root impingement. 3. Additional fairly mild for age multilevel degenerative spondylolysis as above. No other significant stenosis or neural impingement. 4. Markedly abnormal appearance of the visualized bone marrow, suspected to be related to patient's profound anemia and/or osteoporosis. Please note that patient was unable to tolerate the full length of the exam, and MRI of the thoracic spine was not performed. Electronically Signed   By: Jeannine Boga M.D.   On: 06/26/2017 08:13   Dg Chest Port 1 View  Result Date: 06/09/2017 CLINICAL DATA:  72 year old female post replacement ascending aortic aneurysm and repair of chronic dissection. Subsequent encounter. EXAM: PORTABLE CHEST 1 VIEW COMPARISON:  06/07/2017 chest  x-ray. FINDINGS: Post median sternotomy and valve replacement. Surgical clips right axilla. Cardiomegaly. Calcified tortuous aorta. Bilateral pleural effusions and basilar atelectasis. Mild central pulmonary vascular prominence without pulmonary edema. No segmental consolidation separate from the basilar consolidation. IMPRESSION: Overall similar appearance to prior exam. Persistent opacification lung bases suggestive of pleural fluid with atelectasis. Cardiomegaly post valve replacement. Aortic Atherosclerosis (ICD10-I70.0). Electronically Signed   By: Genia Del M.D.   On: 06/09/2017 07:16   Dg Chest Port 1 View  Result Date: 06/07/2017 CLINICAL DATA:  Followup exam.  Chest tube.  Recent cardiac surgery. EXAM: PORTABLE CHEST 1 VIEW COMPARISON:  06/06/2017 and older exams. FINDINGS: Changes from cardiac surgery and valve replacement are stable from the previous day's study. There is prominence of the aortic arch. No mediastinal widening. There is persistent opacity at the lung bases obscuring hemidiaphragms consistent with a combination of pleural effusions and atelectasis. No pulmonary edema. No pneumothorax. Left subclavian central venous line is stable, tip projecting at the confluence of the left brachiocephalic vein and superior vena cava. IMPRESSION: 1. No significant change from the previous day's study. 2. Persistent lung base opacities consistent with combination of moderate effusions with atelectasis. 3. No pulmonary edema.  No pneumothorax or mediastinal widening. Electronically Signed   By: Lajean Manes M.D.   On: 06/07/2017 07:55   Dg Chest Port 1 View  Result Date: 06/06/2017 CLINICAL DATA:  Aortic valve replacement. EXAM: PORTABLE CHEST 1 VIEW COMPARISON:  06/05/2017. FINDINGS: Surgical staples and clips noted over the right chest. Prior median sternotomy. Prior cardiac valve replacement. Stable cardiomegaly. Persistent bibasilar atelectasis/infiltrates and bilateral pleural effusions. No  change. No pneumothorax. IMPRESSION: Prior cardiac valve replacement.  Stable cardiomegaly. 2. Persistent bibasilar atelectasis/infiltrates and bilateral pleural effusions again noted without interim change. Electronically Signed   By: Marcello Moores  Register   On: 06/06/2017 07:45   Dg Chest Port 1 View  Result Date: 06/05/2017 CLINICAL DATA:  Status post AVR EXAM: PORTABLE CHEST 1 VIEW COMPARISON:  Chest radiograph from one day prior. FINDINGS: Intact sternotomy wires. Aortic valve prosthesis is in place. Left subclavian central venous catheter terminates over upper third of the superior vena cava. Surgical clips and staples overlie the right upper chest. Stable cardiomediastinal silhouette with mild cardiomegaly. No pneumothorax. Small bilateral pleural effusions, left greater than right, stable. Cephalization of the pulmonary vasculature without overt pulmonary edema. Moderate bibasilar atelectasis is stable. IMPRESSION: 1. Stable small bilateral pleural effusions with moderate bibasilar atelectasis.  2. Stable cardiomegaly without overt pulmonary edema. Electronically Signed   By: Ilona Sorrel M.D.   On: 06/05/2017 08:00   Dg Chest Port 1 View  Result Date: 06/03/2017 CLINICAL DATA:  Cough and congestion with shortness of breath EXAM: PORTABLE CHEST 1 VIEW COMPARISON:  June 01, 2017 FINDINGS: Chest tubes bilaterally have been removed. Cordis has been removed. Left subclavian catheter tip is in the left innominate vein, stable. Mediastinal drain has been removed. No pneumothorax. There are pleural effusions bilaterally with patchy airspace consolidation in the left mid lower lung zones. There is mild atelectasis in the right base. There is stable cardiomegaly. There is an aortic valve replacement. No adenopathy. There is aortic atherosclerosis. No bone lesions. IMPRESSION: Several tubes and catheters have been removed. Left subclavian catheter currently with tip in left innominate vein. No pneumothorax.  Bilateral pleural effusions noted with patchy atelectasis and consolidation in the left mid lower lung zones as well as right base atelectasis. Stable cardiac prominence. There is aortic atherosclerosis. Aortic Atherosclerosis (ICD10-I70.0). Electronically Signed   By: Lowella Grip III M.D.   On: 06/03/2017 08:55   Dg Chest Port 1 View  Result Date: 06/01/2017 CLINICAL DATA:  Extubated.  Chest tubes.  Follow-up. EXAM: PORTABLE CHEST 1 VIEW COMPARISON:  05/31/2017 FINDINGS: Endotracheal tube is been removed. Swan-Ganz catheter is been removed. Mediastinal drain and bilateral chest tubes remain in place. No pneumothorax. Left subclavian central line tip in the SVC at the azygos level. Persistent left lower lobe atelectasis. IMPRESSION: Endotracheal tube and Swan-Ganz catheter removed. Other lines and tubes remain well positioned. No pneumothorax. Persistent left lower lobe atelectasis. Electronically Signed   By: Nelson Chimes M.D.   On: 06/01/2017 06:41   Ir Thoracentesis Asp Pleural Space W/img Guide  Result Date: 06/12/2017 INDICATION: Post open heart valve replacement surgery. Left-sided pleural effusion. Request for therapeutic thoracentesis. EXAM: ULTRASOUND GUIDED LEFT THORACENTESIS MEDICATIONS: None. COMPLICATIONS: None immediate. PROCEDURE: An ultrasound guided thoracentesis was thoroughly discussed with the patient and questions answered. The benefits, risks, alternatives and complications were also discussed. The patient understands and wishes to proceed with the procedure. Written consent was obtained. Ultrasound was performed to localize and mark an adequate pocket of fluid in the left chest. The area was then prepped and draped in the normal sterile fashion. 1% Lidocaine was used for local anesthesia. Under ultrasound guidance a Safe-T-Centesis catheter was introduced. Thoracentesis was performed. The catheter was removed and a dressing applied. FINDINGS: A total of approximately only 300 mL  of clear amber colored fluid was removed. IMPRESSION: Successful ultrasound guided left thoracentesis yielding 300 mL of pleural fluid. Read by: Ascencion Dike PA-C Electronically Signed   By: Markus Daft M.D.   On: 06/12/2017 13:00   Ir Thoracentesis Asp Pleural Space W/img Guide  Result Date: 06/11/2017 INDICATION: Recent repair of thoracic aneurysm. Pleural effusions. Request for thoracentesis. EXAM: ULTRASOUND GUIDED RIGHT THORACENTESIS MEDICATIONS: 2% Lidocaine = 10 mL COMPLICATIONS: None immediate. PROCEDURE: An ultrasound guided thoracentesis was thoroughly discussed with the patient and questions answered. The benefits, risks, alternatives and complications were also discussed. The patient understands and wishes to proceed with the procedure. Written consent was obtained. Ultrasound was performed to localize and mark an adequate pocket of fluid in the right chest. The area was then prepped and draped in the normal sterile fashion. 1% Lidocaine was used for local anesthesia. Under ultrasound guidance a 6 Fr Safe-T-Centesis catheter was introduced. Thoracentesis was performed. The catheter was removed and a dressing applied.  FINDINGS: A total of approximately 550 mL of blood tinged fluid was removed. Limited ultrasound of the left chest also revealed moderate pleural effusion. IMPRESSION: Successful ultrasound guided right thoracentesis yielding 550 mL of pleural fluid. No pneumothorax on post procedure chest X-ray. Moderate left pleural effusion. Read by: Gareth Eagle, PA-C Electronically Signed   By: Corrie Mckusick D.O.   On: 06/11/2017 16:41    Microbiology: Recent Results (from the past 240 hour(s))  Culture, blood (routine x 2)     Status: None   Collection Time: 06/25/17  8:06 PM  Result Value Ref Range Status   Specimen Description   Final    BLOOD RIGHT ANTECUBITAL Performed at [facility] [address] [place], [place] [address]    Special Requests   Final    BOTTLES  DRAWN AEROBIC AND ANAEROBIC Blood Culture adequate volume Performed at [facility] [address], [place], [place] [address]    Culture   Final    NO GROWTH 5 DAYS Performed at [facility], [place] [address], [place], [place] [address]    Report Status 06/30/2017 FINAL  Final  Urine culture     Status: Abnormal   Collection Time: 06/25/17 10:44 PM  Result Value Ref Range Status   Specimen Description   Final    URINE, RANDOM Performed at [facility] [address], [place], [place] [address]    Special Requests   Final    NONE Performed at [facility], [place] [address], [place], [place] [address]    Culture >=100,000 COLONIES/mL ENTEROBACTER AEROGENES (A)  Final   Report Status 06/28/2017 FINAL  Final   Organism ID, Bacteria ENTEROBACTER AEROGENES (A)  Final      Susceptibility   Enterobacter aerogenes - MIC*    CEFAZOLIN >=64 RESISTANT Resistant     CEFTRIAXONE 32 INTERMEDIATE Intermediate     CIPROFLOXACIN <=0.25 SENSITIVE Sensitive     GENTAMICIN <=1 SENSITIVE Sensitive     IMIPENEM 1 SENSITIVE Sensitive     NITROFURANTOIN 64 INTERMEDIATE Intermediate     TRIMETH/SULFA <=20 SENSITIVE Sensitive     PIP/TAZO >=128 RESISTANT Resistant     * >=100,000 COLONIES/mL ENTEROBACTER AEROGENES  Culture, blood (routine x 2)     Status: None (Preliminary result)   Collection Time: 06/25/17 11:40 PM  Result Value Ref Range Status   Specimen Description   Final    BLOOD LEFT ARM Performed at [facility] [address], [place], [place] [address]    Special Requests   Final    BOTTLES DRAWN AEROBIC AND ANAEROBIC Blood Culture adequate volume Performed at [facility] [address], [place], [place] [address]    Culture   Final    NO GROWTH 4 DAYS Performed at [facility], [place] [address] [place],  [address]    Report Status PENDING  Incomplete  Respiratory Panel  by PCR     Status: None   Collection Time: 06/28/17 10:07 AM  Result Value Ref Range Status   Adenovirus NOT DETECTED NOT DETECTED Final   Coronavirus 229E NOT DETECTED NOT DETECTED Final   Coronavirus HKU1 NOT DETECTED NOT DETECTED Final   Coronavirus NL63 NOT DETECTED NOT DETECTED Final   Coronavirus OC43 NOT DETECTED NOT DETECTED Final   Metapneumovirus NOT DETECTED NOT DETECTED Final   Rhinovirus / Enterovirus NOT DETECTED NOT DETECTED Final   Influenza A NOT DETECTED NOT DETECTED  Final   Influenza B NOT DETECTED NOT DETECTED Final   Parainfluenza Virus 1 NOT DETECTED NOT DETECTED Final   Parainfluenza Virus 2 NOT DETECTED NOT DETECTED Final   Parainfluenza Virus 3 NOT DETECTED NOT DETECTED Final   Parainfluenza Virus 4 NOT DETECTED NOT DETECTED Final   Respiratory Syncytial Virus NOT DETECTED NOT DETECTED Final   Bordetella pertussis NOT DETECTED NOT DETECTED Final   Chlamydophila pneumoniae NOT DETECTED NOT DETECTED Final   Mycoplasma pneumoniae NOT DETECTED NOT DETECTED Final    Comment: Performed at [facility], [place] [address], [place], [place] [address]     Labs: Basic Metabolic Panel: Recent Labs  Lab 06/25/17 2005 06/27/17 0454 06/29/17 0657  NA 144 141 143  K 3.9 3.6 3.1*  CL 93* 97* 98*  CO2 39* 34* 36*  GLUCOSE 100* 99 117*  BUN 99* 100* 89*  CREATININE 4.27* 3.68* 2.96*  CALCIUM 9.7 9.0 9.6   Liver Function Tests: Recent Labs  Lab 06/25/17 2005  AST 54*  ALT 34  ALKPHOS 76  BILITOT 0.5  PROT 7.7  ALBUMIN 2.5*   No results for input(s): LIPASE, AMYLASE in the last 168 hours. No results for input(s): AMMONIA in the last 168 hours. CBC: Recent Labs  Lab 06/25/17 2005 06/27/17 0454 06/29/17 0657 06/29/17 2329  WBC 11.7* 8.1 6.7 6.8  NEUTROABS 9.7*  --   --   --   HGB 8.0* 7.0* 7.3* 7.6*  HCT 27.0* 23.6* 23.9* 25.3*  MCV 103.8* 102.2* 103.5* 102.4*  PLT 289 240 235 275   Cardiac Enzymes: No results for input(s): CKTOTAL, CKMB,  CKMBINDEX, TROPONINI in the last 168 hours. BNP: BNP (last 3 results) Recent Labs    04/19/17 1653 05/22/17 1102 06/26/17 1039  BNP 766.4* 800.0* 802.3*    ProBNP (last 3 results) Recent Labs    11/27/16 1036  PROBNP 202.0*    CBG: Recent Labs  Lab 06/26/17 0819 06/27/17 0827 06/28/17 0742 06/29/17 0751 06/30/17 0756  GLUCAP 84 102* 117* 101* 109*       Signed:  Velvet Bathe MD.  [facility] 06/30/2017, 5:18 PM

## 2017-07-01 ENCOUNTER — Telehealth: Payer: Self-pay | Admitting: Hematology

## 2017-07-01 LAB — CULTURE, BLOOD (ROUTINE X 2)
CULTURE: NO GROWTH
SPECIAL REQUESTS: ADEQUATE

## 2017-07-01 NOTE — Telephone Encounter (Signed)
Patient wanted to cancel due to having open heart surgery

## 2017-07-03 ENCOUNTER — Encounter: Payer: Self-pay | Admitting: *Deleted

## 2017-07-03 ENCOUNTER — Ambulatory Visit: Payer: Medicare Other | Admitting: Hematology

## 2017-07-03 ENCOUNTER — Other Ambulatory Visit: Payer: Self-pay | Admitting: *Deleted

## 2017-07-03 ENCOUNTER — Other Ambulatory Visit: Payer: Medicare Other

## 2017-07-03 DIAGNOSIS — I5030 Unspecified diastolic (congestive) heart failure: Secondary | ICD-10-CM | POA: Diagnosis not present

## 2017-07-03 DIAGNOSIS — D631 Anemia in chronic kidney disease: Secondary | ICD-10-CM | POA: Diagnosis not present

## 2017-07-03 DIAGNOSIS — N184 Chronic kidney disease, stage 4 (severe): Secondary | ICD-10-CM | POA: Diagnosis not present

## 2017-07-03 DIAGNOSIS — I130 Hypertensive heart and chronic kidney disease with heart failure and stage 1 through stage 4 chronic kidney disease, or unspecified chronic kidney disease: Secondary | ICD-10-CM | POA: Diagnosis not present

## 2017-07-03 DIAGNOSIS — Z48812 Encounter for surgical aftercare following surgery on the circulatory system: Secondary | ICD-10-CM | POA: Diagnosis not present

## 2017-07-03 NOTE — Patient Outreach (Signed)
[facility]) Care Management [facility] CM Telephone Outreach ** PCP office completes Transition of Care follow up after hospital discharge **  07/03/2017  [name] 12-22-1945 [id number]  Successful telephone outreach to [name], 72 y/o female referred to [facility] after recent hospital visit February 14-June 16, 2017 for shortness of breath with hypoxia and pulmonary edema; patient noted to have aortic insufficiency and subacute dissection of aortic root; surgical AVR and repair of type A chronic ascending thoracic aortic dissection was completed on 05/30/17.Patient has history including, but not limited to, CKD; HTN;  dCHF; chronic pericardial effusion secondary to CHF, and aortic insufficiency. Patient was discharged home with home health services through [facility] in place for RN/ PT/ OT.Unfortunately, patient experienced hospital re-admission 10 days after hospital discharge, 3/20-25/ 2019 for fever, AKI/ dehydration, and weakness, and was diagnosed with sepsis/ UTI.  SNF rehabilitation was recommended post-most recent hospitalization, however, patient  was again discharged home with home health services in place.  HIPAA/ identity verified with patient today during phone call.  Today, patient again reports that she "is doing fine, much much better" after most recent hospital discharge, states, "I got so sick last time, but they gave me antibiotics and I am better now." Patient denies pain today, recent/ new falls post-hospital discharge; patient sounds to be in no obvious/ apparent distress throughout entirety of today's phone call.  Reports that she "doesn't know" what happened around recommended SNF placement after most recent hospitalization; states, "they told me I should go to SNF, but then at the last minute, they told me I was going home."  Patient further reports:  Medications: -- Has all medicationsand takes as prescribed;denies questions about  current medications, confirms that "no changes" were made to her medications post- most recent hospital discharge.  Home health [facility]) services: -- [facility], OT, [facility]; encouraged patient's active participation in all [facility] services -- confirms that [facility] services have begun;confirms that she has telephone number for home health team; reports expects [facility] PT visit today, "any time now." -- again confirms that she has received DME (oxygenator) from [facility]  Provider appointments: -- All upcoming provider appointments were reviewed with patient today; noted that patient did not have scheduled PCP appointment post-most recent hospital discharge; patient stated that her PCP office called her after she got home, and she verbalizes plans to contact them "today" to schedule another office visit post- most recent hospital discharge-- encouraged patient to complete this promptly -- again reports she is planning to attend all scheduled provider appointments and that her husband will provide transportation  Safety/ Mobility/ Falls: -- denies new/ recent falls post-both hospital discharges -- assistive devices:continues using walker and/ or cane "sometimes," reports ongoing weakness, but adds that she "feels steady" in ambulation -- general fall risks/ prevention education reiterated with patient today  Social/ Community Resource needs: -- again denies community resource needs, stating supportive family members that assist with care needs as indicated -- family (primarily husband) provides transportation for patient to all provider appointments, errands, etc  Patient denies further issues, concerns, or problems today. I re-provided and confirmed that patient hasmy direct phone number, the main [facility] CM office phone number, and the [facility] CM 24-hour nurse advice phone number should issues arise prior to next scheduled [place] outreach by phone next week.   [facility] RN CM home visit was scheduled around patient's preference today. Encouraged patient to contact me  directly if needs, questions, issues, or concerns arise prior to next scheduled outreach; patient agreed to do so.  Plan:  Patient will take all medications as prescribed and will attend all scheduled provider appointments  Patient will actively participate in home health services as ordered post-hospital discharge  Patient will promptly notify care providers for any new concerns, issues, or problems that arise  Perla to continue with scheduled telephone call next week, and initial home visit in 2 weeks  [facility] CM Care Plan Problem One     Most Recent Value  Care Plan Problem One  Risk for hospital re-admission related to/ as evidenced by 2 recent hospital admissions within 30 days of one another   Role Documenting the Problem One  Care Management [place] for Problem One  Active  [facility] Long Term Goal   Over the next 31 days, patient will not experience hospital readmission, as evidenced by patient reporting and review of EMR during [facility] RN CCM outreach  [place] Term Goal Start Date  07/03/17 [Goal re-established today]  Interventions for Problem One Long Term Goal  Discussed with patient current clinical status/ condition, pain levels, discharge instructions, and confirmed that patient has no medication changes or concerns post-most recent hospitalization/ discharge home  [facility] CM Short Term Goal #1   Over the next 30 days, patient will actively participate in home health services as ordered post-hospital discharge  [facility] CM Short Term Goal #1 Start Date  07/03/17 [Goal re-established]  Interventions for Short Term Goal #1  Confirmed that patient has heard from home health team and has phone number for home health agency,  reviewed expected home visits from [facility] team with patient today  [facility] CM Short Term Goal #2   Over the next 7 days, patient will  attend all scheduled provider appointments, as evidenced by patient reporting and review of EMR during [facility] RN CCM outreach  [facility] CM Short Term Goal #2 Start Date  07/03/17 [Goal re-established]  Interventions for Short Term Goal #2  Confirmed with patient that she has spoken to PCP office staff post-most recent hospital discharge, and encouraged patient to schedule post-hospital discharge office visit with PCP promptly     [name], RN, BSN, [place] Coordinator [facility] Care Management  [phone]

## 2017-07-10 ENCOUNTER — Other Ambulatory Visit: Payer: Self-pay | Admitting: *Deleted

## 2017-07-10 ENCOUNTER — Encounter: Payer: Self-pay | Admitting: *Deleted

## 2017-07-10 DIAGNOSIS — N184 Chronic kidney disease, stage 4 (severe): Secondary | ICD-10-CM | POA: Diagnosis not present

## 2017-07-10 DIAGNOSIS — D631 Anemia in chronic kidney disease: Secondary | ICD-10-CM | POA: Diagnosis not present

## 2017-07-10 DIAGNOSIS — I130 Hypertensive heart and chronic kidney disease with heart failure and stage 1 through stage 4 chronic kidney disease, or unspecified chronic kidney disease: Secondary | ICD-10-CM | POA: Diagnosis not present

## 2017-07-10 DIAGNOSIS — Z48812 Encounter for surgical aftercare following surgery on the circulatory system: Secondary | ICD-10-CM | POA: Diagnosis not present

## 2017-07-10 DIAGNOSIS — I5030 Unspecified diastolic (congestive) heart failure: Secondary | ICD-10-CM | POA: Diagnosis not present

## 2017-07-10 NOTE — Patient Outreach (Signed)
[place] [facility]) Care Management         [facility] CM Multidisciplinary Case Discussion 07/10/2017  [name] Mar 30, 1946 [id number]  [facility] CM Multidisciplinary Case Discussion re: [name], 72 y/o female referred to [place] after recent hospital visit February 14-June 16, 2017 for shortness of breath with hypoxia and pulmonary edema; patient noted to have aortic insufficiency and subacute dissection of aortic root; surgical AVR and repair of type A chronic ascending thoracic aortic dissection was completed on 05/30/17.Patient has history including, but not limited to, CKD; HTN; dCHF; chronic pericardial effusion secondary to CHF, and aortic insufficiency. Patient was discharged home with home health services through [place] in place for RN/ PT/ OT.Unfortunately, patient experienced hospital re-admission 10 days after hospital discharge, 3/20-25/ 2019 for fever, AKI/ dehydration, and weakness, and was diagnosed with sepsis/ UTI.  SNF rehabilitation was recommended post-most recent hospitalization, however, patient  was again discharged home with home health services in place.  Barriers: -- multiple chronic co-morbidities/ conditions  -- recent extended hospitalization (29 days) with re-admission for LE weakness, UTI, within 30 days of previous hospital discharge -- questionable need for SNF/ rehabilitation visit post-hospitalizations; no notes found addressing potential benefit of SNF placement during hospitalizations; questionable level of care needs in patient with optimistic outlook, report of her current condition  THN CM Team Discussion: Possibility of patient's positive outlook/ report of her condition may cloud/ undermine current level of care needs; patient has not yet scheduled post-hospitalization PCP office visit; [place] CM following closely with initial home visit scheduled for next week  Plan: -- [place] RN to contact patient's home health agency  Al[facility] Lemmings) to inquire/ collaborate on patient's post-hospital discharge progress/ mobility, level of care needs -- [place] RN CM to continue following patient closely with scheduled call this afternoon, home visit next week -- [place] CM will again encouraged patient to schedule PCP office visit post-hospital discharge; will facilitate as indicated -- [facility] CM Multidisciplinary Case Discussion for update next week  [name], RN, BSN, [employer] Coordinator [facility] Care Management  [phone]

## 2017-07-10 NOTE — Patient Outreach (Signed)
[place] [facility]) Care Management [place] Telephone Outreach  07/10/2017  [name] 07-19-1945 [id number]  Successful telephone outreach to [name], 72 y/o female referred to [place] after recent hospital visit February 14-June 16, 2017 for shortness of breath with hypoxia and pulmonary edema; patient noted to have aortic insufficiency and subacute dissection of aortic root; surgical AVR and repair of type A chronic ascending thoracic aortic dissection was completed on 05/30/17.Patient has history including, but not limited to, CKD; HTN; dCHF; chronic pericardial effusion secondary to CHF, and aortic insufficiency. Patient was discharged home with home health services through [place] in place for RN/ PT/ OT.Unfortunately, patient experienced hospital re-admission 10 days after hospital discharge, 3/20-25/ 2019 for fever, AKI/ dehydration, and weakness, and was diagnosed with sepsis/ UTI.  SNF rehabilitation was recommended post-most recent hospitalization, however, patient  was again discharged home with home health services in place.  HIPAA/ identity verified with patient today during phone call.  Today, patientreports that she "is doing okay, but has (L) arm/ shoulder soreness after sleeping wrong on arm."  Reports that she woke with (L) arm soreness and has been using "cream and heat" on arm.  Rates pain at "5/10."  Patient denies new/ recent falls today post-most recent hospital discharge, stating that she "is getting around pretty good using cane."  Patient sounds to be in no obvious/ apparent distress throughout entirety of today's phone call.  Patient further reports:  Medications: -- Has all medicationsand takes as prescribed;denies questions about current medications -- again declines medication review, stating that are medications are not in reach and she is resting; discussed importance of prompt medication review with patient, and verbally  contracted with patient that this should be completed at time of scheduled home visit next week, which patient agrees with  Home health [facility]) services: -- [place] services for PT, OT, [facility]; encouraged patient's active participation in all [facility] services, and discussed with patient that I had spoken in collaboration with home health PT this morning; discussed possibility of patient agreeing to [facility] extension, based on [facility] PT recommendation; patient reports that [facility] was set up for her "for a limited time," and that she believes she may not need additional services.  Discussed with patient value of agreeing to [facility] extension of services so she can get stronger, and reminded her of her extended hospitalization where she may have lost strength/ mobility.  Patient states she "will think about it," and we agreed to discuss again during our scheduled home visit next week. -- reports that she was visited by home health PT "just a few minutes ago;" reports that she "was able to do most of the exercises," despite her new onset of (L) arm pain  Provider appointments: -- again noted through review of EMR that patient has not yet scheduled PCP appointment post-most recent hospital discharge; patient states that she "tried to call once and couldn't get through," and she again verbalizes plans to contact them "today" to schedule another office visit post- most recent hospital discharge-- encouraged patient to complete this now; discussed with patient that if she is able to obtain an appointment next week during our scheduled home visit, to take the appointment, and assured her that we could re-schedule our home visit.  Thoroughly discussed with patient today the importance of prompt PCP follow up post-hospitalization -- offered to assist patient in scheduling PCP office visit promptly, and patient laughed, and stated, "I can make my own appointments," and  she further added that she has to check with her  husband around his schedule, as he will be providing transportation.  Patient assures me that she will contact her PCP office as soon as we finish talking to schedule this appointment  Safety/ Mobility/ Falls: -- again denies new/ recent fallspost-both hospital discharges; states that she "has never had a probblem with falling." -- assistive devices:previously reported using walkerprimarily for ambulation; today she reports that she is "using cane" for ambulation within her home, and walker "if needed" when she goes out, which she adds "isn't very often."  Continues to report that she believes she is "walking just fine."  Denies ongoing issues with lower extremity weakness after most recent hospitalization. -- general fall risks/ prevention education againreiterated with patient today  Self-health management of chronic disease state of CHF: -- reports continues using home O2 at 2 L/min, "all the time" -- denies issues around breathing status, shortness of breath -- endorses that she monitors and records daily weights, and verbalizes accurate understanding of rationale for doing so; weight gain guidleines reviewed with patient to cotntact provider for weight gain > 3 lbs overnight/ 5 lbs in one week; reports weight since last hospital discharge "running right at 183 lbs," which she reports "is down" from recent hospital visits, where they were recorded at "193 lbs."  Encouraged patient to continue monitoring/ recording daily weights, and she verbalizes understanding/ agreement.  Patient denies further issues, concerns, or problems today.I re-provided andconfirmed that patient hasmy direct phone number, the main [place] CM office phone number, and the [facility] CM 24-hour nurse advice phone number should issues arise prior to next scheduled [place] initial home visit next week.Encouraged patient to contact me directly if needs, questions, issues, or concerns arise prior to next  scheduled outreach; patient agreed to do so.  Plan:  Patient will take all medications as prescribed and will attend all scheduled provider appointments  Patient will promptly schedule post-hospital discharge PCP follow up appointment  Patient will actively participate in home health services as ordered post-hospital discharge, and will consider having these services extended  Patient will continue monitoring and recording daily weights  Patient will continue using assistive devices for ambulation  Patient will promptly notify care providers for any new concerns, issues, or problems that arise  [place] to continue with scheduled initial home visit next week  [facility] CM Care Plan Problem One     Most Recent Value  Care Plan Problem One  Risk for hospital re-admission related to/ as evidenced by 2 recent hospital admissions within 30 days of one another   Role Documenting the Problem One  Care Management Coordinator  Care Plan for Problem One  Active  THN Long Term Goal   Over the next 31 days, patient will not experience hospital readmission, as evidenced by patient reporting and review of EMR during [place] outreach  [place] Term Goal Start Date  07/03/17 [Goal re-established today]  Interventions for Problem One Long Term Goal  Discussed with patient current clinical status,  confirmed with patient that she has not yet scheduled PCP appointment, and again encouraged patient to schedule this appointment promptly,  offered to assist/ facilitate patient in scheduling PCP office visit, which patient declined,  confirmed with patient [facility] CCM RN initial home visit scheduled for next week  THN CM Short Term Goal #1   Over the next 30 days, patient will actively participate in home health services as  ordered post-hospital discharge  THN CM Short Term Goal #1 Start Date  07/03/17 [Goal re-established]  Interventions for Short Term Goal #1  Placed care coordination call for  purposes of collaboration to home health PT to discuss progress of patient post-most recent hospital discharge,  encouraged patient to consider accepting the extension of services which were offered to patient by [facility] PT,  discussed with patient value of home health PT/ services to help her gain strength post- 2 recent hospitalizations  THN CM Short Term Goal #2   Over the next 7 days, patient will schedule PCP provider appointment, as evidenced by patient reporting and review of EMR during [facility] RN CCM outreach [Goal modified today around patient's lack of meeting goal]  [facility] CM Short Term Goal #2 Start Date  07/10/17 [Goal re-established]  Interventions for Short Term Goal #2  Discussed with patient value of prompt PCP follow up post-hospital discharge,  discussed with patient lack of meeting previously established goal around post-hospital PCP follow up, and encouarged patient to promptly schedule post-hospital discharge PCP appointment.  Offered to facilitate scheduling PCP office visit, which patient declined.  Again ensured that patient has reliable transportation to attend provider office visits.     [name], RN, BSN, [employer] [facility] Care Management  [phone]

## 2017-07-10 NOTE — Patient Outreach (Signed)
[place] [facility]) [place] Coordination  07/10/2017  Kellyn Mccary 1946-02-01 [id number]  11:50 am:  Unsuccessful telephone outreach for care coordination to "[name]" at [place] [id number]) re: [name], 72 y/o female referred to [place] after recent hospital visit February 14-June 16, 2017 for shortness of breath with hypoxia and pulmonary edema; patient noted to have aortic insufficiency and subacute dissection of aortic root; surgical AVR and repair of type A chronic ascending thoracic aortic dissection was completed on 05/30/17.Patient has history including, but not limited to, CKD; HTN; dCHF; chronic pericardial effusion secondary to CHF, and aortic insufficiency. Patient was discharged home with home health services through [place] in place for RN/ PT/ OT.Unfortunately, patient experienced hospital re-admission 10 days after hospital discharge, 3/20-25/ 2019 for fever, AKI/ dehydration, and weakness, and was diagnosed with sepsis/ UTI. SNF rehabilitation was recommended post-most recent hospitalization, however, patient was again discharged home with home health services in place.  [name] confirmed today that [place] agency is active in patient's care; explained that I would like to collaborate with patient's home health care team regarding patient's progress with their services thus far; [name] agreed to send message to care team, requesting that they contact me today.  12:00 Noon:  Received incoming call from "Sree" PT with [place] home health agency; Maximino Greenland reports today that he has been working with patient since her first discharge home from hospital; he reports to me today that he believes patient has limited ability to progress through home health PT, due to patient "being a hoarder;" states that patient's home "hardly has a place to walk, with one narrow pathway through the home."  States  that patient uses a cane for ambulation, as there is "not room for" a walker due to "boxes piled up throughout her home."  Reports patient's progress has "been very slow," during his time working with patient and that patient has reported to him that she "likes" her home the way it is, and "does not want to clean anything up or remove boxes all over the home."    [facility] further reports that he plans on discharging patient after his visit with her "next week," as patient has refused extending home health services, as offered to her by home health staff.  He reports that when patient experienced her second hospitalization on June 25, 2017, it was a result of him contacting EMS during his home visit; he reported that EMS was unable to get stretcher inside her home due to "all the boxes and clutter."  Reports that he and EMS team had to "carefully assist patient" in ambulating outside to get into ambulance; adds that he believes that patient "has no motivation" to make positive changes around her current state of health/ home environment issues, which limits the benefit she will receive from home health services.  Provided Sree my direct phone number should he wish to contact me in the future in collaboration.  Plan:  Will collaborate as indicated with home health team  [name], RN, BSN, [place] Coordinator [facility] Care Management  [phone]

## 2017-07-15 ENCOUNTER — Encounter: Payer: Self-pay | Admitting: *Deleted

## 2017-07-15 ENCOUNTER — Other Ambulatory Visit: Payer: Self-pay | Admitting: *Deleted

## 2017-07-15 NOTE — Patient Outreach (Signed)
[place] [facility]) Care Management [place] Telephone Outreach  07/15/2017  [name] 23-Jan-1946 [id number]  Received voice mail message form patient, stating that she would like to cancel tomorrow's scheduled [facility] RN CM home visit; requesting call back.  2:15 pm:  Successful telephone outreach [name], 72 y/o female referred to [place] after recent hospital visit February 14-June 16, 2017 for shortness of breath with hypoxia and pulmonary edema; patient noted to have aortic insufficiency and subacute dissection of aortic root; surgical AVR and repair of type A chronic ascending thoracic aortic dissection was completed on 05/30/17.Patient has history including, but not limited to, CKD; HTN; dCHF; chronic pericardial effusion secondary to CHF, and aortic insufficiency. Patient was discharged home with home health services through [place] in place for RN/ PT/ OT.Unfortunately, patient experienced hospital re-admission 10 days after hospital discharge, 3/20-25/ 2019 for fever, AKI/ dehydration, and weakness, and was diagnosed with sepsis/ UTI. SNF rehabilitation was recommended post-most recent hospitalization, however, patient was again discharged home with home health services in place. HIPAA/ identity verified with patient today during phone call.  Today, patient reports that she would like to cancel tomorrow's previously scheduled home visit, as she "just has too much to do," to "get ready for her doctor's appointment on Thursday."  Discussed with patient that she has not yet had medication review completed post- hospitalizations, and that I would be able to help identify any issues/ concerns and communicate with her doctor if any were found; encouraged patient to consider maintaining plans for scheduled [facility] CCM RN home visit tomorrow; eventually patient agreed to keeping home visit as scheduled.  Patient denies pain and new/ recent falls today, and  states that she is continuing to use her cane/ walker for ambulation around her home.  Reports that she uses "wheelchair when out and about," but acknowledges today that she has not 'really gotten out very much" post- recent hospitalizations.  Patient sounds to be in no apparent/ acute distress throughout entirety of today's phone call, and she states several times that she "is doing fine."  Discussed with patient planned visit tomorrow, at which time, we will complete a medication review and review her daily recorded weights; patient reports today that she does not record her daily weights, she "just keeps in memory."  Encouraged patient to begin actually recording daily weights.  Patient reports weights "have all been right at 183 lbs," post- most recent hospital discharge.  Reports that she recently obtained a scale "from the home health people," and that she is "unsure" if this scale is a part of her insurance beneifts; Verbalizes that she does not have any electronic tablet that she "knows about" that goes along with the scale.  Patient denies further issues, concerns, or problems today.I confirmed that patient hasmy direct phone number, the main [facility] CM office phone number, and the [facility] CM 24-hour nurse advice phone number should issues arise prior to next scheduled [place] initial home visit tomorrow, as previously scheduled.Encouraged patient to contact me directly if needs, questions, issues, or concerns arise prior to next scheduled outreach; patient agreed to do so.  Plan:  Patient will take all medications as prescribed and will attend all scheduled provider appointments  Patient will actively participate in home health services as ordered post-hospital discharge, and will consider having these services extended  Patient will continue monitoring and will start recording daily weights  Patient will continue using assistive devices for ambulation  Patient  will promptly notify care providers for any new concerns, issues, or problems that arise  [place] to continue with scheduled initial home visit tomorrow  [facility] CM Care Plan Problem One     Most Recent Value  Care Plan Problem One  Risk for hospital re-admission related to/ as evidenced by 2 recent hospital admissions within 30 days of one another   Role Documenting the Problem One  Care Management [place] for Problem One  Active  THN Long Term Goal   Over the next 31 days, patient will not experience hospital readmission, as evidenced by patient reporting and review of EMR during [facility] RN CCM outreach  [place] Term Goal Start Date  07/03/17 [Goal re-established today]  Interventions for Problem One Long Term Goal  Discussed with patient need to have prompt medication review, and encouraged patient to allow [place] RN CM home visit scheduled for tomorrow to occur as planned,  confirmed home visit for tomorrow at patient's home  [facility] CM Short Term Goal #1   Over the next 30 days, patient will actively participate in home health services as ordered post-hospital discharge  [facility] CM Short Term Goal #1 Start Date  07/03/17 [Goal re-established]  Interventions for Short Term Goal #1  Discussed with patient her current mobility,  again encouraged patient to accept [facility] PT offer to extend their allotted time with patient  THN CM Short Term Goal #2   Over the next 7 days, patient will schedule PCP provider appointment, as evidenced by patient reporting and review of EMR during [facility] RN CCM outreach [Goal modified today around patient's lack of meeting goal]  [facility] CM Short Term Goal #2 Start Date  07/10/17 [Goal re-established]  THN CM Short Term Goal #2 Met Date  07/15/17- Goal Met  Interventions for Short Term Goal #2  Confirmed with patient that she scheduled PCP office visit, and is planning to attend on 07/17/17 as scheduled,  confirmed that patient has transportation to  scheduled appointment     [name], RN, BSN, [place] Care Management  [phone]

## 2017-07-16 ENCOUNTER — Other Ambulatory Visit: Payer: Self-pay | Admitting: *Deleted

## 2017-07-16 ENCOUNTER — Encounter: Payer: Self-pay | Admitting: *Deleted

## 2017-07-16 NOTE — Patient Outreach (Signed)
[facility]) Care Management  [facility] Community CM Initial Home Visit 15 days post-last hospital discharge-- PCP completes [facility] outreach post-hospital discharge [place] Telephone Outreach Care Coordination x 2  07/16/2017  [name] [birth year] [id number]  [name] is an 72 y.o. female referred to [place] after recent hospital visit February 14-June 16, 2017 for shortness of breath with hypoxia and pulmonary edema; patient noted to have aortic insufficiency and subacute dissection of aortic root; surgical AVR and repair of type A chronic ascending thoracic aortic dissection was completed on 05/30/17.Patient has history including, but not limited to, CKD; HTN; dCHF; chronic pericardial effusion secondary to CHF, and aortic insufficiency. Patient was discharged home with home health services through [place] in place for RN/ PT/ OT.Unfortunately, patient experienced hospital re-admission 10 days after hospital discharge, 3/20-25/ 2019 for fever, AKI/ dehydration, and weakness, and was diagnosed with sepsis/ UTI. SNF rehabilitation was recommended post-most recent hospitalization, however, patient was again discharged home with home health services in place. HIPAA/ identity verified with patient in person today at her home; patient's husband is present in home during visit, but does not actively participate in visit.  Pleasant 75 minute home visit.  Today, patient reports that she is "doing much better than" she was; she denies pain today and denies new/ recent falls.  Patient is in no obvious distress throughout today's home visit.  Subjective: "I know I need to get stronger, but I feel like I am doing good enough here at my home and I don't want to be anywhere else- I like it here, the way it is; I will think about whether or not I would like to extend my home health PT and will talk to my doctor about it tomorrow."  Assessment:  Patient appears to be  recuperating fairly well after her recent hospitalizations, despite limitations in her mobility/ ability to progress with home health PT due to her significantly crowded home environment, which has signs of hoarding.  Patient acknowledges that she may have benefited from short-term rehabilitation after her last hospitalization, but currently does not wish to leave her home, despite limitations of value of home health PT, due to her current living environment.  Patient is adherent with taking medications and attending provider appointments, and she denies community resource needs, reporting supportive family members that assist in her care needs as indicated.  Patient verbalizes a good general understanding of her chronic disease state of CHF, but could benefit from ongoing reinforcement of education provided today self-health management strategies for same.   Patient further reports:  Medications: -- Has all medicationsand takes as prescribed;denies questions about current medications, and has all of her medications ready in her home for review today, as previously discussed -- verbalizes a good general understanding of the purpose, dosing, and scheduling of medications -- denies issues with swallowing medications --takes medications directly from the bottle; declines desire to use pill planner box -- Patient was recently discharged from hospital and all medications were thoroughly reviewed with patient at her home today; medication review revealed no discrepancies or concerns.  Home health [facility]) services: -- [place] [facility]; patient verbalizes active participation in home health services, states that she is "not sure" that she wishes to extend [facility] PT services to improve her strength and mobility; states that she "is not sure" she needs this; we had a long discussion today around her extended hospitalizations, and I explained to patient that that alone can  cause one  to be weak/deconditioned.  I encouraged patient to consider extending [place] PT services, as previously recommended by [facility] PT, and she stated she was considering, and plans to discuss with her PCP during tomorrow's scheduled office visit -- discussed with patient limits around value of PT sessions, given her crowded home environment conditions; encouraged patient to remove some of the clutter that is in every room to expand her walking pathways; patient reports that "this is her home," and she "likes it just the way it is." -- discussed [place] PT recent and upcoming visits with patient; placed care coordination call to [place] PT for Alvis Lemmings [phone]) to update him on my conversation/ visit with patient today; Maximino Greenland reports that he has scheduled visit with patient tomorrow afternoon, and states that he will again encourage patient to allow him to request [facility] PT extension, despite her limitations to progress due to her home environment.  Provider appointments: -- Reviewed and discussed all upcoming provider appointments: positive reinforcement provided for patient scheduling PCP appointment scheduled for tomorrow morning; patient reports husband to transport her to all upcoming provider appointments -- care coordination outreach call placed to patient's vascular provider, as patient had questions about obtaining CXR prior to attending provider appointment, as instructed; spoke with Jana Half at [facility] [phone]) and confirmed that their staff will schedule CXR prior to next week's appointment on 07/23/17, that patient should arrive at office building 30 minutes prior to scheduled appointment and go to first floor ([place]) for CXR; patient's provider appointment will be on fourth floor -- patient endorses upcoming renal provider appointment on July 26, 2017  Safety/ Mobility/ Falls: -- again denies new/ recent fallspost-bothhospital discharges; again states that she "has never had a probblem with  falling." -- MULTIPLE FALL HAZARDS IN HOME: as noted above, patient has limited pathways around her home environment for ambulation; her entire home is cluttered with boxes, trinkets, various items, and there only narrow pathways to get from room to room; additionally, there are numerous throw rugs placed throughout home that are wrinkled and bunched; patient reports that she is "used to" this, and that her husband "straightens the rugs" regularly, and "will do again tonight."  O2 tubing runs from small hallway bathroom where oxygenator is located throughout patient's home.  Patient reports that she 'is used to" picking up O2 tubing prior to ambulating, and states this "is not a problem." -- patient demonstrates slow, steady, purposeful gait around designated pathways in her home using cane; appears that a walker would not fit in these designated pathways -- extensive fall risks/ prevention educationprovided/ discussed with patient today  Community Resource Needs: -- discussed with patient community resource needs; patient states today that she "has none;" stating that she has very supportive extended family members that help she and her husband with any care needs, as indicated; reports her husband "takes very good care" of her -- discussed possibility of [facility] CSW referral for home improvement, clearing of cluttered areas, etc; patient adamantly declines, stating that she has "no needs."  Self-health management of chronic disease state of CHF: -- reports continues using home O2 at 2 L/min, "all the time" -- denies issues around breathing status, shortness of breath; patient is slightly short of breath with ambulation around her home today, however, this completely resolves within seconds after she resumes sitting position -- previously endorsed that she monitors and records daily weights, and verbalized accurate understanding of rationale for doing so; however, today, patient admits that the scale  she  obtained recently from [employer] through home health agency" doesn't "read right;"  Scale was inspected, and noted to be set on kgs setting, rather than lbs setting; this was corrected today during home visit.  Discussed with patient [employer] HF program through her insurance provider, which would provide her with scales and tablet for ongoing daily weight monitoring, and patient stated that "now that" her scales are "fixed," she doesn't "not need another set;" patient states that she "will think about" this insurance benefit, and that she is open for further discussion during future [facility] RN CM outreach. -- patient previously reported that her daily weights had been running "right at 183 lbs" consistently since her hospital discharge; today, we placed scale in hard surface floor in kitchen and her weight was noted at 179.8 lbs; instructed patient in proper use of scale, and instructed her to use only on hard surfaces; patient verbalizes understanding of same.  Reiterated weight gain guidleines with patient to cotntact provider for weight gain > 3 lbs overnight/ 5 lbs in one week -- provided/ reviewed/ discussed printed educational material around CHF zones with patient; patient was not familiar with CHF zones, and we reviewed all zones with corresponding action plans, with a focus on the yellow zone. -- Encouraged patient to continue monitoring/ begin recording daily weights, and she verbalizes understanding/ agreement.  Patient denies further issues, concerns, or problems today.I re-provided andconfirmed that patient hasmy direct phone number, the main [facility] CM office phone number, and the [facility] CM 24-hour nurse advice phone number should issues arise prior to next scheduled [place] scheduled telephone call next week.Encouraged patient to contact me directly if needs, questions, issues, or concerns arise prior to next scheduled outreach; patient agreed to do so.  Objective:     BP 140/84   Pulse 90   Ht 1.676 m (5\' 6" )   Wt 179 lb 12.8 oz (81.6 kg)   SpO2 98%   BMI 29.02 kg/m   Review of Systems  Constitutional: Positive for malaise/fatigue.  Respiratory: Positive for shortness of breath. Negative for cough and wheezing.        Patient minimally short of breath with short distance ambulation around her home today; recovers quickly with rest/ resumption of sitting position  Cardiovascular: Positive for orthopnea and leg swelling. Negative for chest pain and palpitations.       Bilateral LE knee- ankle swelling noted at +2-3; patient reports this is her baseline, "maybe a little more swollen" than usual; no erythema, oozing, or discoloration noted  Gastrointestinal: Negative for abdominal pain, diarrhea, heartburn and nausea.  Genitourinary: Positive for frequency and urgency.       Diuretic therapy  Musculoskeletal: Positive for myalgias. Negative for falls.  Neurological: Positive for tingling. Negative for dizziness.       Reports occasional tingling/ neuropathy; denies today  Psychiatric/Behavioral: Negative for depression. The patient has insomnia. The patient is not nervous/anxious.        Reports chronic insomnia   Physical Exam  Constitutional: She is oriented to person, place, and time. She appears well-developed and well-nourished. No distress.  obese  Cardiovascular: Normal rate, regular rhythm and intact distal pulses.  Murmur heard. Pulses:      Radial pulses are 2+ on the right side, and 2+ on the left side.       Dorsalis pedis pulses are 1+ on the right side, and 1+ on the left side.  Bilateral DP difficult to palpate; bilateral LE swelling,  as noted in ROS  Respiratory: Effort normal and breath sounds normal. No respiratory distress. She has no wheezes. She has no rales.  O2 via  on "all the time" at 2 L/min via   GI: Soft. Bowel sounds are normal.  Musculoskeletal: She exhibits edema.  See ROS  Neurological: She is alert and  oriented to person, place, and time.  Skin: Skin is warm and dry. No erythema.  Psychiatric: She has a normal mood and affect. Her behavior is normal. Thought content normal.   Encounter Medications:   Outpatient Encounter Medications as of 07/16/2017  Medication Sig Note  . aspirin EC 325 MG EC tablet Take 1 tablet (325 mg total) by mouth daily.   . calcitRIOL (ROCALTROL) 0.25 MCG capsule Take 1 capsule (0.25 mcg total) by mouth every other day. Please take every Monday, Wednesday, Friday   . calcium carbonate (TUMS - DOSED IN MG ELEMENTAL CALCIUM) 500 MG chewable tablet Chew 2 tablets by mouth daily as needed for indigestion or heartburn.   . carvedilol (COREG) 3.125 MG tablet Take 1 tablet (3.125 mg total) by mouth 2 (two) times daily with a meal.   . FAMOTIDINE PO Take 1 tablet by mouth 2 (two) times daily as needed (gas).   . fluticasone (FLONASE) 50 MCG/ACT nasal spray Place 2 sprays daily into both nostrils. (Patient taking differently: Place 2 sprays into both nostrils daily as needed for allergies. )   . furosemide (LASIX) 80 MG tablet Take 2 tablets (160 mg total) by mouth 2 (two) times daily.   [place] gabapentin (NEURONTIN) 300 MG capsule TAKE 1 CAPSULE(300 MG) BY MOUTH THREE TIMES DAILY AS NEEDED FOR PAIN 07/16/2017: Takes as needed; has not needed recently  . hydrALAZINE (APRESOLINE) 10 MG tablet Take 1 tablet (10 mg total) by mouth 2 (two) times daily.   . hydrocortisone (ANUSOL-HC) 2.5 % rectal cream Place 1 application rectally as needed. (Patient taking differently: Place 1 application rectally daily as needed for hemorrhoids. )   . ondansetron (ZOFRAN) 4 MG tablet Take 1 tablet (4 mg total) by mouth every 8 (eight) hours as needed for nausea or vomiting. 07/16/2017: Takes only as needed, has not needed recently   . pantoprazole (PROTONIX) 40 MG tablet Take 1 tablet (40 mg total) daily by mouth. (Patient taking differently: Take 40 mg by mouth 2 (two) times daily as needed (indigestion). )  07/16/2017: Reports takes as needed; reports has not needed recently  . Propylene Glycol (SYSTANE BALANCE) 0.6 % SOLN Place 1 drop into both eyes daily as needed (for dry eyes).   . traMADol (ULTRAM) 50 MG tablet Take 1 tablet (50 mg total) by mouth every 12 (twelve) hours as needed for severe pain. 07/16/2017: Takes only as needed  . triamcinolone ointment (KENALOG) 0.5 % Apply 1 application topically daily as needed (itching).    No facility-administered encounter medications on file as of 07/16/2017.    Functional Status:   In your present state of health, do you have any difficulty performing the following activities: 06/26/2017 06/20/2017  Hearing? N -  Vision? N -  Difficulty concentrating or making decisions? N N  Walking or climbing stairs? Y Y  Comment - -  Dressing or bathing? Y N  Doing errands, shopping? Y Y  Comment - Husband provides Copywriter, advertising and eating ? - Y  Comment - Husband assists with food preparation; no problems eating  Using the Toilet? - N  In the past six months, have  you accidently leaked urine? - Y  Do you have problems with loss of bowel control? - N  Managing your Medications? - Y  Comment - Husband assists  Managing your Finances? - Y  Comment - Husband assists  Housekeeping or managing your Housekeeping? - Y  Comment - Husband/ family assists  Some recent data might be hidden   Fall/Depression Screening:    Fall Risk  07/16/2017 07/15/2017 07/10/2017  Falls in the past year? No (No Data) No  Comment Patient denies new/ recent falls Ptaient denies new/ recent falls Patient denies new/ recent falls  Risk for fall due to : Impaired balance/gait;Impaired mobility;Medication side effect;Other (Comment) - -  Risk for fall due to: Comment Home environment- high fall risk - -   PHQ 2/9 Scores 06/20/2017 01/22/2017 12/08/2015 06/29/2012  PHQ - 2 Score 0 0 0 1   Plan:  Patient will take all medications as prescribed and will attend all scheduled  provider appointments  Patient will actively participate in home health services as ordered post-hospital discharge, and will consider having these services extended  Patient will continue monitoring and will start recording daily weights  Patient will continue using assistive devices for ambulation  Patient will promptly notify care providers for any new concerns, issues, or problems that arise  I will share today's [place] initial home visit notes with patient's PCP and vascular surgeon  [place] to continue with scheduled phone call next week  [facility] CM Care Plan Problem One     Most Recent Value  Care Plan Problem One  Risk for hospital re-admission related to/ as evidenced by 2 recent hospital admissions within 30 days of one another   Role Documenting the Problem One  Care Management Coordinator  Care Plan for Problem One  Active  [facility] Long Term Goal   Over the next 31 days, patient will not experience hospital readmission, as evidenced by patient reporting and review of EMR during [facility] RN CCM outreach  [facility] Long Term Goal Start Date  07/03/17 [Goal re-established today]  Interventions for Problem One Long Term Goal  [facility] Community CM RN initia home visit completed, with physical assessment, medication review, review of provider appointments,  provided pt. with [facility] Calendar tool for keeping appointments and recording daily weights,  placed care coordination call to [facility] to obtain additional information around patient's upcoming scheduled appointment next week  [facility] CM Short Term Goal #1   Over the next 30 days, patient will actively participate in home health services as ordered post-hospital discharge  [facility] CM Short Term Goal #1 Start Date  07/03/17 [Goal re-established]  Interventions for Short Term Goal #1  Discussed with patient her limitations/ ability to prgress with [facility] PT, given her home environment,  encouraged patient to consider discussing her need for  temporary SNF placement for rehabilitation, and also to consider extending home health services for PT    [facility] CM Care Plan Problem Two     Most Recent Value  Care Plan Problem Two  Knowledge deficit regarding self-health management of chronic disease state of CHF, as evidenced by patient reporting   Role Documenting the Problem Two  Care Management Coordinator  Care Plan for Problem Two  Active  Interventions for Problem Two Long Term Goal   Using teachback method, provided and discussed educational material regarding CHF zones and corresponding action plans for each zone  [facility] Long Term Goal  Over the next 45 days, patient will be  able to discuss CHF zones and corresponding action plan for yellow zone, as evidenced by patient reporting during [place] outreach  [place] Term Goal Start Date  07/16/17  [facility] CM Short Term Goal #1   Over the next 30 days, patient will monitor and record daily weights, as evidenced by patient reporting and review of same during [place] outreach   [facility] CM Short Term Goal #1 Start Date  07/16/17  Interventions for Short Term Goal #2   Using teachback method, provided/ reviewed/ and discussed with patient rationale for daily weights and weight gain guidelines in setting of CHF,  discussed possibility with patient of insurance benefits for new scales, and encouraged patient to consider,  adjusted patient's current scales and instructed patient in proper method to obtain weights at home     I appreciate the opportunity to participate in [name]'s care,  [name], RN, BSN, [employer] Coordinator [facility] Care Management  [phone]

## 2017-07-17 ENCOUNTER — Ambulatory Visit: Payer: Self-pay | Admitting: *Deleted

## 2017-07-17 ENCOUNTER — Other Ambulatory Visit: Payer: Self-pay | Admitting: *Deleted

## 2017-07-17 ENCOUNTER — Encounter: Payer: Self-pay | Admitting: Internal Medicine

## 2017-07-17 ENCOUNTER — Ambulatory Visit (INDEPENDENT_AMBULATORY_CARE_PROVIDER_SITE_OTHER): Payer: Medicare Other | Admitting: Internal Medicine

## 2017-07-17 ENCOUNTER — Other Ambulatory Visit: Payer: Medicare Other

## 2017-07-17 VITALS — BP 132/80 | HR 96 | Temp 98.8°F | Ht 66.0 in | Wt 182.0 lb

## 2017-07-17 DIAGNOSIS — N184 Chronic kidney disease, stage 4 (severe): Secondary | ICD-10-CM | POA: Diagnosis not present

## 2017-07-17 DIAGNOSIS — I5032 Chronic diastolic (congestive) heart failure: Secondary | ICD-10-CM | POA: Diagnosis not present

## 2017-07-17 DIAGNOSIS — R29898 Other symptoms and signs involving the musculoskeletal system: Secondary | ICD-10-CM

## 2017-07-17 DIAGNOSIS — N179 Acute kidney failure, unspecified: Secondary | ICD-10-CM | POA: Diagnosis not present

## 2017-07-17 DIAGNOSIS — N390 Urinary tract infection, site not specified: Secondary | ICD-10-CM

## 2017-07-17 DIAGNOSIS — I7101 Dissection of ascending aorta: Secondary | ICD-10-CM

## 2017-07-17 LAB — POCT URINALYSIS DIPSTICK
Bilirubin, UA: NEGATIVE
Blood, UA: NEGATIVE
Glucose, UA: NEGATIVE
KETONES UA: NEGATIVE
Leukocytes, UA: NEGATIVE
NITRITE UA: NEGATIVE
PH UA: 6 (ref 5.0–8.0)
Protein, UA: NEGATIVE
SPEC GRAV UA: 1.015 (ref 1.010–1.025)
UROBILINOGEN UA: 0.2 U/dL

## 2017-07-17 NOTE — Telephone Encounter (Signed)
Richarda Osmond [facility] RN called with concerns regarding the patient's home environment. 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  Physical therapy has been coming to work with her but the space is limited. They would like to continue working with her. Richarda Osmond talked to  the patient about going to skilled nursing for rehab but the patient was very hesitant. So possibly offer outpatient rehab to her. The patient states her husband can transport her to where she needs to go.  She is a high fall risk. She can use her cane to get around but definitely not a walker thru the house.  Her home is slowing her ability to progress. She is asking if her pcp would recommend the  patient to go to skilled nursing.

## 2017-07-17 NOTE — Progress Notes (Addendum)
   Subjective:    Patient ID: [name], female    DOB: 08-13-1945, 72 y.o.   MRN: [id number]  HPI The patient is a 72 YO female coming in for two hospital follow ups (in for open heart surgery for aortic valve and dissection back in February with open heart surgery and fairly normal recovery, in for UTI and weakness about 2-3 weeks ago with course of antibiotics and fosfomycin as well as PT). She is recovering well and is doing PT at home. She is wanting to make sure uti is all gone as she was very sick with it. Denies feelings of recurrence. No weakness, burning, pressure. Denies chest pains or SOB. She is still tired more easily. Denies nausea, vomiting, diarrhea, constipation.  PMH, [facility], social history reviewed and updated.   Review of Systems  Constitutional: Positive for activity change and fatigue.  HENT: Negative.   Eyes: Negative.   Respiratory: Positive for shortness of breath. Negative for cough and chest tightness.   Cardiovascular: Negative for chest pain, palpitations and leg swelling.  Gastrointestinal: Negative for abdominal distention, abdominal pain, constipation, diarrhea, nausea and vomiting.  Musculoskeletal: Negative.   Skin: Negative.   Neurological: Negative.   Psychiatric/Behavioral: Negative.       Objective:   Physical Exam  Constitutional: She is oriented to person, place, and time. She appears well-developed and well-nourished.  HENT:  Head: Normocephalic and atraumatic.  Eyes: EOM are normal.  Neck: Normal range of motion.  Cardiovascular: Normal rate and regular rhythm.  Pulmonary/Chest: Effort normal and breath sounds normal. No respiratory distress. She has no wheezes. She has no rales.  Abdominal: Soft. Bowel sounds are normal. She exhibits no distension. There is no tenderness. There is no rebound.  Musculoskeletal: She exhibits no edema.  Neurological: She is alert and oriented to person, place, and time. Coordination abnormal.  Walker/cane for  ambulation  Skin: Skin is warm and dry.  Psychiatric: She has a normal mood and affect.   Vitals:   07/17/17 0920  BP: 132/80  Pulse: 96  Temp: 98.8 F (37.1 C)  TempSrc: Oral  SpO2: 90%  Weight: 182 lb (82.6 kg)  Height: 5\' 6"  (1.676 m)      Assessment & Plan:

## 2017-07-17 NOTE — Patient Instructions (Signed)
We are checking the urine today.  Think about doing the cardiac rehab as this is a good opportunity.

## 2017-07-17 NOTE — Patient Outreach (Signed)
[place]) Care Management [place] Telephone Outreach Care Coordination  07/17/2017  [name] Jul 13, 1945 [id number]  Successful telephone outreach for care coordination to [facility], triage nurse [phone]) at Dr. Nathanial Millman office, PCP for [name], 72 y.o. female referred to [place] after recent hospital visit February 14-June 16, 2017 for shortness of breath with hypoxia and pulmonary edema; patient noted to have aortic insufficiency and subacute dissection of aortic root; surgical AVR and repair of type A chronic ascending thoracic aortic dissection was completed on 05/30/17.Patient has history including, but not limited to, CKD; HTN; dCHF; chronic pericardial effusion secondary to CHF, and aortic insufficiency. Patient was discharged home with home health services through [place] in place for RN/ PT/ OT.Unfortunately, patient experienced hospital re-admission 10 days after hospital discharge, 3/20-25/ 2019 for fever, AKI/ dehydration, and weakness, and was diagnosed with sepsis/ UTI. SNF rehabilitation was recommended post-most recent hospitalization, however, patient was again discharged home with home health services in place.  Call was placed today to update patient's PCP re: [place] initial home visit yesterday, at which time it was revealed that patient's home environment is significantly cluttered with signs of hoarding, which is limiting progress patient is able to accomplish with home health PT; discussed overall patient condition/ home environment with [facility], and recommended/ requested that possibility of short-term rehabilitation facility placement vs. Outpatient PT vs. Extension of home health PT services be addressed with patient during office visit this morning.  Plan:  Will collaborate as indicated with patient's PCP  [name], RN, BSN, [place] Care Management  [phone]

## 2017-07-17 NOTE — Patient Outreach (Signed)
[place] [facility]) Care Management  07/17/2017  [name] Sep 23, 1945 [id number]  [facility] CM Multidisciplinary Case Discussion UPDATE (update in red text) re: [name], 72 y/o female referred to [place] after recent hospital visit February 14-June 16, 2017 for shortness of breath with hypoxia and pulmonary edema; patient noted to have aortic insufficiency and subacute dissection of aortic root; surgical AVR and repair of type A chronic ascending thoracic aortic dissection was completed on 05/30/17.Patient has history including, but not limited to, CKD; HTN; dCHF; chronic pericardial effusion secondary to CHF, and aortic insufficiency. Patient was discharged home with home health services through [place] in place for RN/ PT/ OT.Unfortunately, patient experienced hospital re-admission 10 days after hospital discharge, 3/20-25/ 2019 for fever, AKI/ dehydration, and weakness, and was diagnosed with sepsis/ UTI. SNF rehabilitation was recommended post-most recent hospitalization, however, patient was again discharged home with home health services in place.  Barriers: -- multiple chronic co-morbidities/ conditions  -- recent extended hospitalization (29 days) with re-admission for LE weakness, UTI, within 30 days of previous hospital discharge -- questionable need for SNF/ rehabilitation visit post-hospitalizations; no notes found addressing potential benefit of SNF placement during hospitalizations; questionable level of care needs in patient with optimistic outlook, report of her current condition -- crowded home environment with significant signs of hoarding: limits progression with home health PT -- patient optimistic outlook/ perspective limits patient's full acknowledgement of existing barriers -- patient's lack of desire to change her environment, accept [facility] CSW referral   [facility] CM Team Discussion: Possibility of patient's positive outlook/ report of her condition  clouds/ undermines current level of care needs; patient to attend scheduled post-hospitalization PCP office visit this morning at 9:30 am; [place] completed initial home visit yesterday, despite patient's attempt to cancel visit day before scheduled visit. Possibility of short-term rehabilitation facility visit vs. Outpatient PT vs. Extension of home health PT discussed; patient's refusal of [facility] CSW referral discussed     Plan: -- [place] RN to contact patient's PCP regarding limits to patient's progression/ mobility, level of care needs due to home environment-- done -- [place] RN CM to continue following patient closely and to continue efforts to encourage patient agree to [facility] CSW referral -- [place] RN CM to continue collaborating with patient's care team as indicated   [name], RN, BSN, [employer] Coordinator [facility] Care Management  [phone]

## 2017-07-18 ENCOUNTER — Telehealth: Payer: Self-pay | Admitting: Internal Medicine

## 2017-07-18 DIAGNOSIS — I130 Hypertensive heart and chronic kidney disease with heart failure and stage 1 through stage 4 chronic kidney disease, or unspecified chronic kidney disease: Secondary | ICD-10-CM | POA: Diagnosis not present

## 2017-07-18 DIAGNOSIS — Z48812 Encounter for surgical aftercare following surgery on the circulatory system: Secondary | ICD-10-CM | POA: Diagnosis not present

## 2017-07-18 DIAGNOSIS — N184 Chronic kidney disease, stage 4 (severe): Secondary | ICD-10-CM | POA: Diagnosis not present

## 2017-07-18 DIAGNOSIS — I5030 Unspecified diastolic (congestive) heart failure: Secondary | ICD-10-CM | POA: Diagnosis not present

## 2017-07-18 DIAGNOSIS — D631 Anemia in chronic kidney disease: Secondary | ICD-10-CM | POA: Diagnosis not present

## 2017-07-18 NOTE — Assessment & Plan Note (Signed)
Improving with antibiotics and needs to continue PT.

## 2017-07-18 NOTE — Assessment & Plan Note (Signed)
Appears to be recovering well and sternal scar is healed. Encouraged to do cardiopulmonary rehab once she is able to help with stamina and recovery.

## 2017-07-18 NOTE — Telephone Encounter (Signed)
Fine

## 2017-07-18 NOTE — Assessment & Plan Note (Signed)
Seeing nephrology next week and asks to defer labs until then so as not to duplicate labs.

## 2017-07-18 NOTE — Assessment & Plan Note (Signed)
Stable at this time and weights are stable from discharge. Diet is stable

## 2017-07-18 NOTE — Telephone Encounter (Signed)
Copied from [place] [id number]. Topic: Quick Communication - See Telephone Encounter >> Jul 18, 2017 12:32 PM Bea Graff, NT wrote: CRM for notification. See Telephone encounter for: 07/18/17. Sree with [facility] calling to get verbal orders to extend PT for 1 time a week for 4 weeks. CB#: [phone]

## 2017-07-18 NOTE — Assessment & Plan Note (Signed)
Checking U/A and urine culture today for clearance given the severity of her recent infection.

## 2017-07-19 DIAGNOSIS — R0602 Shortness of breath: Secondary | ICD-10-CM | POA: Diagnosis not present

## 2017-07-19 LAB — URINE CULTURE
MICRO NUMBER:: 90447847
SPECIMEN QUALITY:: ADEQUATE

## 2017-07-21 NOTE — Telephone Encounter (Signed)
Notified Sree w/MD response.../lmb 

## 2017-07-22 ENCOUNTER — Other Ambulatory Visit: Payer: Self-pay | Admitting: Cardiothoracic Surgery

## 2017-07-22 DIAGNOSIS — Z952 Presence of prosthetic heart valve: Secondary | ICD-10-CM

## 2017-07-23 ENCOUNTER — Encounter: Payer: Self-pay | Admitting: Cardiothoracic Surgery

## 2017-07-23 ENCOUNTER — Ambulatory Visit (INDEPENDENT_AMBULATORY_CARE_PROVIDER_SITE_OTHER): Payer: Self-pay | Admitting: Cardiothoracic Surgery

## 2017-07-23 ENCOUNTER — Ambulatory Visit
Admission: RE | Admit: 2017-07-23 | Discharge: 2017-07-23 | Disposition: A | Discharge status: Home / Self Care | Payer: Medicare Other | Source: Ambulatory Visit | Attending: Cardiothoracic Surgery | Admitting: Cardiothoracic Surgery

## 2017-07-23 VITALS — BP 150/90 | HR 100 | Resp 20 | Ht 66.0 in | Wt 182.0 lb

## 2017-07-23 DIAGNOSIS — I7101 Dissection of thoracic aorta: Secondary | ICD-10-CM

## 2017-07-23 DIAGNOSIS — Z09 Encounter for follow-up examination after completed treatment for conditions other than malignant neoplasm: Secondary | ICD-10-CM

## 2017-07-23 DIAGNOSIS — Z952 Presence of prosthetic heart valve: Secondary | ICD-10-CM

## 2017-07-23 DIAGNOSIS — R05 Cough: Secondary | ICD-10-CM | POA: Diagnosis not present

## 2017-07-23 DIAGNOSIS — I71019 Dissection of thoracic aorta, unspecified: Secondary | ICD-10-CM

## 2017-07-23 MED ORDER — CARVEDILOL 25 MG PO TABS: 12.5000 mg | ORAL_TABLET | Freq: Two times a day (BID) | ORAL | 0 refills | Status: DC

## 2017-07-23 MED ORDER — HYDRALAZINE HCL 10 MG PO TABS: 25.0000 mg | ORAL_TABLET | Freq: Two times a day (BID) | ORAL | Status: DC

## 2017-07-23 NOTE — Progress Notes (Signed)
PCP is Hoyt Koch, MD Referring Provider is Minus Breeding, MD  Chief Complaint  Patient presents with  . Routine Post Op    f/u from surgery with CXR  S/p AVR and repair of chronic type A ascending aortic dissection    HPI: Patient presents for first postop visit for aortic valve replacement for severe AI and repair of a chronic Stanford type a ascending thoracic dissection with straight graft from the sinotubular junction to the proximal aortic arch.  The patient presented in acute pulmonary edema and respiratory distress and elevated creatinine.  At home she has had home health nursing and home hospital physical therapy.  Today she presents in a wheelchair but states she has been walking with a cane with her therapist at home.  She is improving.  She was treated for a Enterobacter UTI 3 weeks ago and now feels much better.  She denies chest pain, syncope, and her ankle edema is stable-improved.  She has maintained sinus rhythm.  Chest x-ray today is clear with cardiomegaly. Patient missed her cardiology appointment and this will be rescheduled with Dr. Jennette Bill  The patient is not ready to start cardiac rehab but will be followed up in the office in 4 weeks for reassessment.  The patient was found to be tachycardic with high blood pressure and her dosing of carvedilol and hydralazine were increased.  Past Medical History:  Diagnosis Date  . Acute pancreatitis 02/04/2017   [name] 02/04/2017  . Adenomatous colon polyp 01/2002  . Anemia, chronic disease    /notes 02/04/2017  . Aortic valve disease    AI/AS  . Arthritis    "lower back, knees" (02/04/2017)  . CHF (congestive heart failure) ([place])   . CKD (chronic kidney disease), stage III ([place])   . Diverticulosis of colon   . DJD (degenerative joint disease)   . Frequent UTI   . Hemorrhoids   . Hypertension   . Lumbar back pain   . Neuropathy   . Obesity   . Pericardial effusion     in a patient with Diastolic  heart  failure and Pericardial effusion  known since last 2 D echo in 11/2016 /notes 02/04/2017  . PONV (postoperative nausea and vomiting)   . Pulmonary HTN ([place])   . Renal cyst   . Venous insufficiency   . Vitamin D deficiency     Past Surgical History:  Procedure Laterality Date  . ABDOMINAL HYSTERECTOMY     "partial"  . AORTIC VALVE REPLACEMENT N/A 05/30/2017   Procedure: AORTIC VALVE REPLACEMENT (AVR);  Surgeon: Prescott Gum, Collier Salina, MD;  Location: [facility];  Service: Vascular;  Laterality: N/A;  . COLONOSCOPY W/ BIOPSIES AND POLYPECTOMY     "bx was ok"  . IR THORACENTESIS ASP PLEURAL SPACE W/IMG GUIDE  06/11/2017  . IR THORACENTESIS ASP PLEURAL SPACE W/IMG GUIDE  06/12/2017  . REPAIR OF ACUTE ASCENDING THORACIC AORTIC DISSECTION N/A 05/30/2017   Procedure: REPLACEMENT OF ASCENDING AORTIC ANEURYSM AND REPAIRED CHRONIC ROOT DISSECTION WITH CIRC ARREST;  Surgeon: Ivin Poot, MD;  Location: [facility];  Service: Vascular;  Laterality: N/A;  . TEE WITHOUT CARDIOVERSION N/A 05/28/2017   Procedure: TRANSESOPHAGEAL ECHOCARDIOGRAM (TEE);  Surgeon: Skeet Latch, MD;  Location: [facility];  Service: Cardiovascular;  Laterality: N/A;  . TEE WITHOUT CARDIOVERSION N/A 05/30/2017   Procedure: TRANSESOPHAGEAL ECHOCARDIOGRAM (TEE);  Surgeon: Prescott Gum, Collier Salina, MD;  Location: [facility];  Service: Open Heart Surgery;  Laterality: N/A;    Family History  Problem  Relation Age of Onset  . Diabetes Mother   . Colon polyps Mother   . Hypertension Mother   . Hypertension Brother   . Hypertension Sister   . Colon cancer Neg Hx     Social History Social History   Tobacco Use  . Smoking status: Former Smoker    Packs/day: 0.40    Years: 15.00    Pack years: 6.00    Types: Cigarettes    Last attempt to quit: 04/08/1982    Years since quitting: 35.3  . Smokeless tobacco: Never Used  Substance Use Topics  . Alcohol use: No  . Drug use: No    Current Outpatient Medications  Medication Sig Dispense  Refill  . aspirin EC 325 MG EC tablet Take 1 tablet (325 mg total) by mouth daily. 30 tablet 0  . calcitRIOL (ROCALTROL) 0.25 MCG capsule Take 1 capsule (0.25 mcg total) by mouth every other day. Please take every Monday, Wednesday, Friday 30 capsule 1  . calcium carbonate (TUMS - DOSED IN MG ELEMENTAL CALCIUM) 500 MG chewable tablet Chew 2 tablets by mouth daily as needed for indigestion or heartburn.    Marland Kitchen FAMOTIDINE PO Take 1 tablet by mouth 2 (two) times daily as needed (gas).    . fluticasone (FLONASE) 50 MCG/ACT nasal spray Place 2 sprays daily into both nostrils. (Patient taking differently: Place 2 sprays into both nostrils daily as needed for allergies. ) 16 g 2  . furosemide (LASIX) 80 MG tablet Take 2 tablets (160 mg total) by mouth 2 (two) times daily. 90 tablet 1  . gabapentin (NEURONTIN) 300 MG capsule TAKE 1 CAPSULE(300 MG) BY MOUTH THREE TIMES DAILY AS NEEDED FOR PAIN 90 capsule 0  . hydrocortisone (ANUSOL-HC) 2.5 % rectal cream Place 1 application rectally as needed. (Patient taking differently: Place 1 application rectally daily as needed for hemorrhoids. ) 30 g 0  . ondansetron (ZOFRAN) 4 MG tablet Take 1 tablet (4 mg total) by mouth every 8 (eight) hours as needed for nausea or vomiting. 20 tablet 0  . pantoprazole (PROTONIX) 40 MG tablet Take 1 tablet (40 mg total) daily by mouth. (Patient taking differently: Take 40 mg by mouth 2 (two) times daily as needed (indigestion). ) 30 tablet 0  . Propylene Glycol (SYSTANE BALANCE) 0.6 % SOLN Place 1 drop into both eyes daily as needed (for dry eyes).    . traMADol (ULTRAM) 50 MG tablet Take 1 tablet (50 mg total) by mouth every 12 (twelve) hours as needed for severe pain. 28 tablet 0  . triamcinolone ointment (KENALOG) 0.5 % Apply 1 application topically daily as needed (itching).    . carvedilol (COREG) 25 MG tablet Take 0.5 tablets (12.5 mg total) by mouth 2 (two) times daily with a meal. 60 tablet 0   Current Facility-Administered  Medications  Medication Dose Route Frequency Provider Last Rate Last Dose  . hydrALAZINE (APRESOLINE) tablet 25 mg  25 mg Oral BID Ivin Poot, MD        Allergies  Allergen Reactions  . Minoxidil Palpitations and Other (See Comments)    unable to sleep  . Naproxen Swelling    Review of Systems   Improved appetite and ambulation since returning home No symptoms of CHF  BP (!) 150/90   Pulse 100   Resp 20   Ht 5\' 6"  (1.676 m)   Wt 182 lb (82.6 kg)   SpO2 95% Comment: RA  BMI 29.38 kg/m  Physical Exam  Exam    General- alert and comfortable    Neck- no JVD, no cervical adenopathy palpable, no carotid bruit   Lungs- clear without rales, wheezes   Cor- regular rate and rhythm, no murmur , gallop   Abdomen- soft, non-tender   Extremities - warm, non-tender, minimal edema   Neuro- oriented, appropriate, no focal weakness  Diagnostic Tests: Chest x-ray clear without pleural effusion  Impression: Slowly improving after AVR for severe AI with acute pulmonary edema and replacement of the ascending aorta for a chronic Stanford type a dissection  Plan: She needs higher dose of carvedilol, increase to 12.5 mg twice daily.  Apresoline increased to 25 mg p.o. twice daily.  She is being followed by renal for her elevated creatinine of 2.7 and she is currently taking Lasix 80 mg. The patient knows she can do light work around the house now and her lifting limit is 10-15 pounds.  She is not yet ready to drive.  Return in 4 weeks for follow-up assessment of progress  Len Childs, MD [facility] [phone]

## 2017-07-24 ENCOUNTER — Other Ambulatory Visit: Payer: Self-pay | Admitting: *Deleted

## 2017-07-24 ENCOUNTER — Encounter: Payer: Self-pay | Admitting: *Deleted

## 2017-07-24 ENCOUNTER — Other Ambulatory Visit: Payer: Self-pay | Admitting: Internal Medicine

## 2017-07-24 ENCOUNTER — Other Ambulatory Visit: Payer: Self-pay

## 2017-07-24 MED ORDER — CIPROFLOXACIN HCL 250 MG PO TABS: 250.0000 mg | ORAL_TABLET | Freq: Two times a day (BID) | ORAL | 0 refills | Status: DC

## 2017-07-24 MED ORDER — HYDRALAZINE HCL 25 MG PO TABS: 25.0000 mg | ORAL_TABLET | Freq: Two times a day (BID) | ORAL | 0 refills | Status: DC

## 2017-07-24 NOTE — Patient Outreach (Signed)
[name]) Care Management [place] hospital discharge day 24  07/24/2017  [name] June 10, 1945 [id number]  Successful outgoing telephone attempt to [name], 72 y.o. female referred to [place] after recent hospital visit February 14-June 16, 2017 for shortness of breath with hypoxia and pulmonary edema; patient noted to have aortic insufficiency and subacute dissection of aortic root; surgical AVR and repair of type A chronic ascending thoracic aortic dissection was completed on 05/30/17.Patient has history including, but not limited to, CKD; HTN; dCHF; chronic pericardial effusion secondary to CHF, and aortic insufficiency. Patient was discharged home with home health services through [place] in place for RN/ PT/ OT.Unfortunately, patient experienced hospital re-admission 10 days after hospital discharge, 3/20-25/ 2019 for fever, AKI/ dehydration, and weakness, and was diagnosed with sepsis/ UTI. SNF rehabilitation was recommended post-most recent hospitalization, however, patient was again discharged home with home health services in place. HIPAA/ identity verified with patient during phone call today.  Today, patient reports that she "is doing real good."  Reports chronic bilateral knee pain, "due to all the activity this week going to doctor's appointments."  Patient denies that she has concerns/ problems, or issues today; reports "got a good report" from both her PCP and her vascular surgeon during recent office visits.  Patient sounds to be in no obvious/ apparent distress throughout entirety of phone call today.  Patient denies new or recent falls, and confirms that she continues using cane/ walker as needed for ambulation; reports used "office wheelchair" when she attended recent provider appointments.  Patient further reports:  Medications: -- Has all medicationsand takes as prescribed;acknowledges that there  were changes made to her BP medications yesterday at time of office visit with cardiothoracic surgeon; patient is able to accurately verbalize changes; reports that her pharmacy filled her new prescription for carvedilol, but stated that pharmacy did not have "new prescription" for hydralazine; reports that she has and is taking hydralazine 10 mg po BID, rather than newly prescribed 25 mg po BID.  Discussed with patient that I would place care coordination outreach to vascular surgeon provider to attempt to facilitate patient obtaining new prescription; patient verbalized that she would also call pharmacy to inquire.  We also discussed note in EMR from earlier today from PCP office, where new antibiotic had been called in to pharmacy; patient reports that she was unaware of this new antibiotic; explained to patient that from review of EMR, this was called in to pharmacy today for ongoing UTI; explained to patient that PCP office staff had attempted to contact her and left voice mail.  Patient appreciative of follow up information, and stated she would inquire about this medication when she contacted her pharmacy about her new hydralazine prescription.    New medication instructions since last [place] outreach: ---- increase carvedilol to 12.5 mg po BID ---- increase hydralazine to 25 mg po BID ---- start Cipro 250 mg po BID x 1 wek  Encouraged patient to obtain/ take all new prescriptions as ordered/ instructed, and she verbalized agreement and understanding.  Home health [facility]) services: -- [place] [facility]; patient verbalizes ongoing active participation in home health PT services, and acknowledges that she has agreed to extend [facility] PT services, as we had previously discussed; positive reinforcement provided. -- patient stated that "this week" she has not had PT home visit, as her knees "have been bothering" her too bad after all the activity from going  to see her  doctor's; patient was encouraged to resume [facility] PT sessions promptly to maintain her progress/ strength, and she agreed to do so.  Provider appointments: --Reviewed and discussed all recent provider appointments: positive reinforcement provided for patient attending scheduled appointments -- confirms that her husband provided transportation to recent appointments; stated "it went fine."  Denied issues getting in and out of vehicle -- reviewed all upcoming provider appointments with patient today   Safety/ Mobility/ Falls: --againdenies new/ recent fallspost- recenthospital discharges; reports continues to use cane/ walker for all ambulation; reports used "office wheelchair" when she attended recent provider appointments  -- previously provided fall risks/ prevention educationreiterated with patient today  Self-health management of chronic disease state of CHF: -- reports continues using home O2 at 2 L/min, "all the time," and denies issues around breathing status; states, "I'm breathing real good."  Acknowledges ongoing episodes shortness of breath with activity, but states, "it goes right away when the activity stops." -- Reports "in green zone" today, and "every day" since [place] initial home visit -- confirms that she continues monitoring and recording daily weights since [place] initial home visit, where scales were adjusted; reports daily weight ranges 176.4- 178.4 lbs consistently since last [facility] home visit; stated that she has been able to get on and off scales without difficulty.  Discussed with patient current insurance benefits for new scale/ HF program, and explained that her insurance company staff has attempted to contact her about this benefit; patient reports that she was unaware of recent calls from [employer]; encouraged patient to take their calls, and explained that I would follow up with insurance company to again provide patient's cell phone number; we also  discussed transportation benefit available to patient through her insurance company.  Patient verbalizes agreement today to discuss benefits with insurance provider team.  Reiterated weight gain guidleines with patient to contact provider for weight gain >3 lbs overnight/ 5 lbs in one week, and patient is able to accurately verbalize action plan for yellow zone of CHF self-health management zones --Encouraged patient to continue monitoring/ recording daily weights, and she verbalizes understanding/ agreement. -- discussed with patient future participation in cardiac rehabilitation, and encouraged patient to consider participating once she is released to begin by vascular surgeon provider, explaining the benefit of cardiac rehabilitation-- again reiterated transportation benefit available to her through her insurance company, and she today agreed to consider this for the future.  Patient denies further issues, concerns, or problems today.I confirmed that patient hasmy direct phone number, the main [facility] CM office phone number, and the [facility] CM 24-hour nurse advice phone number should issues arise prior to next scheduled [place] scheduled telephone callnext week.Encouraged patient to contact me directly if needs, questions, issues, or concerns arise prior to next scheduled outreach; patient agreed to do so.  Plan:  Patient will take all medications as prescribed and will attend all scheduled provider appointments  Patient will actively participate in home health services as ordered post-hospital discharge  Patient will continue monitoring and recording daily weights  Patient will continue using assistive devices for ambulation  Patient will promptly notify care providers for any new concerns, issues, or problems that arise  [place] to continue with scheduled phone callnextweek  [facility] CM Care Plan Problem One     Most Recent Value  Care Plan  Problem One  Risk for hospital re-admission related to/ as evidenced by 2 recent hospital admissions within  30 days of one another   Role Documenting the Problem One  Care Management [place] for Problem One  Active  THN Long Term Goal   Over the next 31 days, patient will not experience hospital readmission, as evidenced by patient reporting and review of EMR during [facility] RN CCM outreach  [place] Term Goal Start Date  07/03/17 [Goal re-established today]  Interventions for Problem One Long Term Goal  Discussed with patient current clinical status, reviewed recent provider office visits,  discussed recent changes to medications and placed care coordination outreach to vascular surgeon/ provider regarding patient's report of not having new prescription available from pharmacy  [facility] CM Short Term Goal #1   Over the next 30 days, patient will actively participate in home health services as ordered post-hospital discharge  [facility] CM Short Term Goal #1 Start Date  07/03/17 [Goal re-established]  Interventions for Short Term Goal #1  Discussed with patient recent home health visits, and provided encouragement that patient has continued participating in [facility] PT and has agreed to extension of [place] services    [facility] CM Care Plan Problem Two     Most Recent Value  Care Plan Problem Two  Knowledge deficit regarding self-health management of chronic disease state of CHF, as evidenced by patient reporting   Role Documenting the Problem Two  Care Management Coordinator  Care Plan for Problem Two  Active  Interventions for Problem Two Long Term Goal   Confirmed that patient understands CHF zones, and obtained report that she believes that she is in green zone,  reiterated with patient previously provided education around CHF zones  [facility] Long Term Goal  Over the next 45 days, patient will be able to discuss CHF zones and corresponding action plan for yellow zone, as evidenced by patient reporting during [place]  outreach  [facility] Long Term Goal Start Date  07/16/17  THN CM Short Term Goal #1   Over the next 30 days, patient will monitor and record daily weights, as evidenced by patient reporting and review of same during [place] outreach   [facility] CM Short Term Goal #1 Start Date  07/16/17  Interventions for Short Term Goal #2   Confirmed that patient has continued monitoring and recording daily weights, and reviewed recent weights with her,  provided positive reinforcement regarding beginning and continuing daily weights, and discussed with her heart failure program/ new scales available to her by insurance provider    [name], RN, BSN, [place] Coordinator [facility] Care Management  [phone]

## 2017-07-24 NOTE — Patient Outreach (Signed)
[place] [facility]) Care Management [place] Telephone Outreach Care Coordination  07/24/2017  [name] 1945/06/05 [id number]   Attempted telephone outreach for care coordination x 2 to [facility]/ Dr. Prescott Gum, [phone]) re : [name] an 72 y.o.femalereferred to [place] after recent hospital visit February 14-June 16, 2017 for shortness of breath with hypoxia and pulmonary edema; patient noted to have aortic insufficiency and subacute dissection of aortic root; surgical AVR and repair of type A chronic ascending thoracic aortic dissection was completed on 05/30/17.Patient has history including, but not limited to, CKD; HTN; dCHF; chronic pericardial effusion secondary to CHF, and aortic insufficiency. Patient was discharged home with home health services through [place] in place for RN/ PT/ OT.Unfortunately, patient experienced hospital re-admission 10 days after hospital discharge, 3/20-25/ 2019 for fever, AKI/ dehydration, and weakness, and was diagnosed with sepsis/ UTI. SNF rehabilitation was recommended post-most recent hospitalization, however, patient was again discharged home with home health services in place.  Call was placed today to notify Dr. Prescott Gum that patient reported to me during phone call this afternoon that her pharmacy did not receive the new prescription for hydralazine (increased during yesterday's office visit with Dr. Lucianne Lei trigt to 25 mg po BID); patient reported that she only has Hydralazine 10 mg po at her home; patient stated she would contact pharmacy to clarify.    With both attempts to call provider office, phone rang without physical or voice mail pick up; eventually, with both attempts, call ended automatically.  Therefore, I placed secure urgent message to Dr. Prescott Gum via EMR, notifying of patient's report that she does not have new dosage of hydralazine.  Plan:  Will collaborate with provider offices as  indicated  Martha Rack, RN, BSN, [place] Care Management  [phone]

## 2017-07-25 ENCOUNTER — Telehealth: Payer: Self-pay | Admitting: Internal Medicine

## 2017-07-25 NOTE — Telephone Encounter (Signed)
Copied from [place] [id number]. Topic: Quick Communication - See Telephone Encounter >> Jul 25, 2017  8:06 AM [place], Laban Emperor wrote: CRM for notification. See Telephone encounter for: 07/25/17.  Pt states she may have pulled a muscle, while getting out the bed. She would like to know if her PCP could call her something in.  [facility] [place], [place] [phone] (Phone) [phone] (Fax)

## 2017-07-28 ENCOUNTER — Telehealth: Payer: Self-pay

## 2017-07-28 NOTE — Telephone Encounter (Signed)
Copied from [place] [id number]. Topic: Inquiry >> Jul 28, 2017  8:47 AM Margot Ables wrote: Reason for CRM: pt states that she pulled a muscle in her low back/hips and has pain medicine but is asking for something to help relax it like a muscle relaxer. Pt asking if this can be sent w/o another appt. Pt saw Dr. Sharlet Salina 07/17/17. Please advise.  [facility] [place] [facility] [place], [address] [place] [phone] (Phone) [phone] (Fax)

## 2017-07-28 NOTE — Telephone Encounter (Signed)
Has she tried tylenol? This is the safest thing to try first.

## 2017-07-28 NOTE — Telephone Encounter (Signed)
Ms. Mckethan called and stated that she did pick up her prescription and wanted to let us know.  However, she also stated that she heard a "pop" getting into the bed a "while ago" and she has begun to feel "tingling in her head".  She stated that she called her PCP and they recommended using Tylenol to help.  She stated that she has been using Tylenol but it does not help.  She has been massaging the area and placing a hot pack on it and has moved to the other side of her back.  I advised her to contact her PCP to get an appointment to see them.  She acknowledged receipt and was very appreciative.

## 2017-07-28 NOTE — Telephone Encounter (Signed)
Patient called back stating she has taken Tylenol and it's not helping. She says she uses Tramadol and gabapentin for a pain in her leg. Please call back on [place] indicated to advise if something will be called in per patients request.

## 2017-07-28 NOTE — Telephone Encounter (Signed)
No answer, left a message to give the office a call back.  Patient called and had questions about prescription called in last week.

## 2017-07-28 NOTE — Telephone Encounter (Signed)
Called patient and LVM informing of MD response.

## 2017-07-29 MED ORDER — CYCLOBENZAPRINE HCL 5 MG PO TABS: 5.0000 mg | ORAL_TABLET | Freq: Three times a day (TID) | ORAL | 0 refills | Status: DC | PRN

## 2017-07-29 NOTE — Telephone Encounter (Signed)
Have sent in muscle relaxer flexeril to try if needed up to 3 times per day.

## 2017-07-29 NOTE — Telephone Encounter (Signed)
Patient informed the Rx was sent to pharmacy

## 2017-07-29 NOTE — Addendum Note (Signed)
Addended by: Pricilla Holm A on: 07/29/2017 07:44 AM   Modules accepted: Orders

## 2017-07-30 ENCOUNTER — Other Ambulatory Visit: Payer: Self-pay | Admitting: *Deleted

## 2017-07-30 ENCOUNTER — Encounter: Payer: Self-pay | Admitting: *Deleted

## 2017-07-30 NOTE — Patient Outreach (Signed)
[place] [facility]) Care Management [place] hospital discharge day 30  07/30/2017  Maryum Batterson [birth year] [id number]  Successful outgoing telephone attempt to [name], 72 y.o.femalereferred to [place] after recent hospital visit February 14-June 16, 2017 for shortness of breath with hypoxia and pulmonary edema; patient noted to have aortic insufficiency and subacute dissection of aortic root; surgical AVR and repair of type A chronic ascending thoracic aortic dissection was completed on 05/30/17.Patient has history including, but not limited to, CKD; HTN; dCHF; chronic pericardial effusion secondary to CHF, and aortic insufficiency. Patient was discharged home with home health services through [place] in place for RN/ PT/ OT.Unfortunately, patient experienced hospital re-admission 10 days after hospital discharge, 3/20-25/ 2019 for fever, AKI/ dehydration, and weakness, and was diagnosed with sepsis/ UTI. SNF rehabilitation was recommended post-most recent hospitalization, however, patient was again discharged home with home health services in place. HIPAA/ identity verified with patient during phone call today.  Today, patient reports that she "is doing pretty good," but "has experienced a pulled muscle in" her back/ neck "which is giving" her "a fit."  Reports pain from pulled muscle at "5 or 6" and states that she contacted her PCP about pain, and has obtained muscle relaxer that was called in to her pharmacy; reports medication "helping."  Patient otherwise denies concerns/ problems, or issues today and sounds to be in no obvious/ apparent distress throughout entirety of phone call today.  Patient denies new or recent falls, and confirms that she continues using cane/ walker as needed for ambulation.  Patient further reports:  Medications: -- Has all medicationsand takes as prescribed;states recent changes made to her BP  medications during cardiothoracic surgeon office visit "have all been straightened out," and she is able to accurately verbalize these changes, and denies questions.  Patient further confirmed that she obtained new antibiotic post-most recent PCP office visit, and "has only one day left" to take; reports adherence to following instructions around antibiotic use.  Home health [facility]) services: -- [place] services for PT [facility];patient verbalizes ongoing active participation in home health PT services, and states that she has a scheduled home visit "tomorrow."  Provider appointments: --Reviewed and discussed all recent and upcoming provider appointments -- confirms that her husband will continue to provide transportation to appointments   Safety/ Mobility/ Falls: --againdenies new/ recent fallspost- recenthospital discharges;reports continues to use cane/ walker for all ambulation --previously providedfall risks/ prevention educationreiteratedwith patient today  Self-health management of chronic disease state of CHF: -- reports continues using home O2 at 2 L/min, "all the time," and continues to deny issues around breathing status; states, "I'm not having any problems with my breathing."  Acknowledges ongoing episodes shortness of breath with activity as her baseline, again reporting prompt recovery with rest -- Reports "in green zone" today -- confirms that she continues monitoring and recording daily weights; reports daily weight ranges this week of "173-175 lbs."  Again discussed with patient current insurance benefits for new scale/ HF program, and patient reports today that she has not yet heard from insurance company about this program.  Discussed with that I would again follow up with insurance company to again provide patient's cell phone number, so they may attempt to contact her. -- Reiterated weight gain guidleines to contact provider for weight gain  >3 lbs overnight/ 5 lbs in one week, and patient is again able to accurately verbalize action plan for yellow zone of CHF self-health management  zones  Patient denies further issues, concerns, or problems today.I confirmed that patient hasmy direct phone number, the main [facility] CM office phone number, and the [facility] CM 24-hour nurse advice phone number should issues arise prior to next scheduled [place] scheduled home visit in 2 weeks. Encouraged patient to contact me directly if needs, questions, issues, or concerns arise prior to next scheduled outreach; patient agreed to do so.  Plan:  Patient will take all medications as prescribed and will attend all scheduled provider appointments  Patient will actively participate in home health services as ordered post-hospital discharge  Patient will continue monitoring andrecording daily weights  Patient will continue using assistive devices for ambulation  Patient will promptly notify care providers for any new concerns, issues, or problems that arise  [place] to continue with scheduledhome visit in 2weeks  [facility] CM Care Plan Problem One     Most Recent Value  Care Plan Problem One  Risk for hospital re-admission related to/ as evidenced by 2 recent hospital admissions within 30 days of one another   Role Documenting the Problem One  Care Management Coordinator  Care Plan for Problem One  Active  THN Long Term Goal   Over the next 31 days, patient will not experience hospital readmission, as evidenced by patient reporting and review of EMR during [facility] RN CCM outreach  [place] Term Goal Start Date  07/03/17 [Goal re-established today]  Interventions for Problem One Long Term Goal  Discussed with patient current clinical status,  use of newly added medications,  confirmed that patient has and is taking all medications as prescribed and has not medication concerns,  reviewed upcoming provider  appointments with patient  [facility] CM Short Term Goal #1   Over the next 30 days, patient will actively participate in home health services as ordered post-hospital discharge  [facility] CM Short Term Goal #1 Start Date  07/03/17 [Goal re-established]  Interventions for Short Term Goal #1  Discussed recent and upcoming home health visits,  again encouraged patient's active participation in [facility] services    [facility] CM Care Plan Problem Two     Most Recent Value  Care Plan Problem Two  Knowledge deficit regarding self-health management of chronic disease state of CHF, as evidenced by patient reporting   Role Documenting the Problem Two  Care Management Coordinator  Care Plan for Problem Two  Active  Interventions for Problem Two Long Term Goal   Reiterated with patient previously provided education around self-health management strategies for CHF,  confirmed that patient believes she is in green zone and that she is able to verbalize signs/ symptoms yellow zone, with corresponding action plan,  discussed ongoing use of home O2 with patient  THN Long Term Goal  Over the next 45 days, patient will be able to discuss CHF zones and corresponding action plan for yellow zone, as evidenced by patient reporting during [place] outreach  [facility] Long Term Goal Start Date  07/16/17  [facility] CM Short Term Goal #1   Over the next 30 days, patient will monitor and record daily weights, as evidenced by patient reporting and review of same during [place] outreach   [facility] CM Short Term Goal #1 Start Date  07/16/17  Interventions for Short Term Goal #2   Confirmed that patient continues monitoring and recording daily weights,  reviewed this week's weights with patient, and provided positive reinforcement for continuing to monitor/ record daily weights,  reiterated weight gain guidelines with patient during phone call today     [name], RN, BSN, [place] Care Management  [phone]

## 2017-07-31 DIAGNOSIS — N184 Chronic kidney disease, stage 4 (severe): Secondary | ICD-10-CM | POA: Diagnosis not present

## 2017-07-31 DIAGNOSIS — I5030 Unspecified diastolic (congestive) heart failure: Secondary | ICD-10-CM | POA: Diagnosis not present

## 2017-07-31 DIAGNOSIS — I130 Hypertensive heart and chronic kidney disease with heart failure and stage 1 through stage 4 chronic kidney disease, or unspecified chronic kidney disease: Secondary | ICD-10-CM | POA: Diagnosis not present

## 2017-07-31 DIAGNOSIS — D631 Anemia in chronic kidney disease: Secondary | ICD-10-CM | POA: Diagnosis not present

## 2017-07-31 DIAGNOSIS — Z48812 Encounter for surgical aftercare following surgery on the circulatory system: Secondary | ICD-10-CM | POA: Diagnosis not present

## 2017-08-07 DIAGNOSIS — D631 Anemia in chronic kidney disease: Secondary | ICD-10-CM | POA: Diagnosis not present

## 2017-08-07 DIAGNOSIS — N184 Chronic kidney disease, stage 4 (severe): Secondary | ICD-10-CM | POA: Diagnosis not present

## 2017-08-07 DIAGNOSIS — I5030 Unspecified diastolic (congestive) heart failure: Secondary | ICD-10-CM | POA: Diagnosis not present

## 2017-08-07 DIAGNOSIS — Z48812 Encounter for surgical aftercare following surgery on the circulatory system: Secondary | ICD-10-CM | POA: Diagnosis not present

## 2017-08-07 DIAGNOSIS — I130 Hypertensive heart and chronic kidney disease with heart failure and stage 1 through stage 4 chronic kidney disease, or unspecified chronic kidney disease: Secondary | ICD-10-CM | POA: Diagnosis not present

## 2017-08-12 ENCOUNTER — Telehealth: Payer: Self-pay | Admitting: Cardiology

## 2017-08-12 NOTE — Telephone Encounter (Signed)
New Message   1) Are you calling to confirm a diagnosis or obtain personal health information (Y/N)? Y 2) If so, what information is requested? Ejection Fraction.   [employer] Rep is requesting information due to patient to being enrolled ion heart failure program. Information can be faxed to [phone].   Please route to Medical Records or your medical records site representative

## 2017-08-14 ENCOUNTER — Encounter: Payer: Self-pay | Admitting: *Deleted

## 2017-08-14 ENCOUNTER — Other Ambulatory Visit: Payer: Self-pay | Admitting: *Deleted

## 2017-08-14 DIAGNOSIS — I130 Hypertensive heart and chronic kidney disease with heart failure and stage 1 through stage 4 chronic kidney disease, or unspecified chronic kidney disease: Secondary | ICD-10-CM | POA: Diagnosis not present

## 2017-08-14 DIAGNOSIS — D631 Anemia in chronic kidney disease: Secondary | ICD-10-CM | POA: Diagnosis not present

## 2017-08-14 DIAGNOSIS — I5030 Unspecified diastolic (congestive) heart failure: Secondary | ICD-10-CM | POA: Diagnosis not present

## 2017-08-14 DIAGNOSIS — N184 Chronic kidney disease, stage 4 (severe): Secondary | ICD-10-CM | POA: Diagnosis not present

## 2017-08-14 DIAGNOSIS — Z48812 Encounter for surgical aftercare following surgery on the circulatory system: Secondary | ICD-10-CM | POA: Diagnosis not present

## 2017-08-14 NOTE — Patient Outreach (Addendum)
[facility]) Care Management  [place] CM Routine Home Visit [place] Telephone Outreach Care Coordination  08/14/2017  [name] July 29, 1945 [id number]  [name] is a 72 y.o. female referred to [place] after recent hospital visit February 14-June 16, 2017 for shortness of breath with hypoxia and pulmonary edema; patient noted to have aortic insufficiency and subacute dissection of aortic root; surgical AVR and repair of type A chronic ascending thoracic aortic dissection was completed on 05/30/17.Patient has history including, but not limited to, CKD; HTN; dCHF; chronic pericardial effusion secondary to CHF, and aortic insufficiency. Patient was discharged home with home health services through [place] in place for RN/ PT/ OT.Unfortunately, patient experienced hospital re-admission 10 days after hospital discharge, 3/20-25/ 2019 for fever, AKI/ dehydration, and weakness, and was diagnosed with sepsis/ UTI. Patient was discharged home after hospital visits with home health services in place. HIPAA/ identity verified with patient in person today at her home; patient's husband is present in home during visit, but does not actively participate in visit.  Pleasant 65 minute home visit.  Subjective: "I think I am doing much better; I just wish this neck pain flare would go away."  Assessment:  Patient appears to continue recuperating fairly well after her recent hospitalizations, despite limitations in her mobility/ ability to progress with home health PT due to her significantly crowded home environment, which has ongoing signs of hoarding.  Patient has attended all provider appointments post-hospital discharge and has remained adherent with taking medications as prescribed.  Although patient previously denied community resource needs, today, she is agreeable to [place] referral for exploration of resources available for possible ramp installation,  transportation options, and mobile meals.  Patient continues able to verbalize a good general understanding of her chronic disease state of CHF, but could benefit from ongoing reinforcement of recently provided education around self-health management strategies for same.   Today, patient reports that she is "still doing real good" but she complains of ongoing neck pain, stating that this has happened before; reports pain at "5/10" and states that she is using medication and voltaren gel prn, "which helps some."  States she is going to contact PCP about this neck pain if she is not better "soon."  Patient denies new/ recent falls, and is in no obvious distress throughout today's home visit.  Patient further reports:  Medications: -- Has all medicationsand takes as prescribed;denies questions/ concerns about current medications, and has all of her medications ready in her home for review today -- again verbalizes a good general understanding of the purpose, dosing, and scheduling of medications -- medications were thoroughly reviewed with patient at her home today; medication review revealed no discrepancies or concerns.  Home health [facility]) services: -- [place] [facility]; patient verbalizes ongoing active participation in home health services, but states that she believes services will be ending "soon;" expects home visit from PT "this afternoon" -- discussed [place] PT progress with patient, who states she will consider further extension of home health services if it is offered; placed care coordination call to [place], [facility] PT for [name] [phone]) to update him on my conversation/ visit with patient today; [name] reports that he has scheduled visit with patient this afternoon, and may be discharging patient, again acknowledging her limitations to progress due to her home environment.  Provider appointments: --Reviewed and discussed all upcoming provider appointments:  positive reinforcement provided for patient attending all scheduled provider visits --  in addition to appointments noted in EMR, patient endorses upcoming renal provider appointment on Aug 19, 2017 -- continues to report husband will provide transportation; denies issues getting in/ out of car, but states it "might be harder" with new neck pain."    Safety/ Mobility/ Falls: --againdenies new/ recent fallspost-bothhospital discharges; again states that she "has never had a probblem with falling." -- [place] persist: as previously noted, patient has limited pathways around her home environment for ambulation; her entire home remains cluttered with boxes, trinkets, various items, and there only narrow pathways to get from room to room; additionally, there are numerous throw rugs placed throughout home that are wrinkled and bunched; patient continues to report that she is "used to" this, and that "does not want to make changes." -- patient again demonstrates slow, steady, purposeful gait around designated pathways in her home using cane; still appears that a walker would not fit in these designated pathways -- fall risks/ prevention educationreiterated with patient today  [employer] Needs: -- patient previously denied community resource needs; today, patient agrees that possibility of having ramp installed by her front porch would be beneficial, as front porch stoop is difficult for her to get on and off -- discussed patient's existing transportation benefit through her insurance company and provided her with print out of benefits; patient agrees to [employer] about this benefit -- patient states today that she and her husband "might" benefit from having mobile meals, although she acknowledges that family members continue assisting with food acquisition regularly -- again discussed possibility of having [facility] CSW referral placed to discuss all of the above  with patient, and today, patient is agreeable to this   Self-health management of chronic disease state of CHF: -- reports continues using home O2 at 2 L/min, "most of the time," states "has stopped using continuously," and believes she "is doing well."  Patient is not wearing O2 at time of home visit, and demonstrates no shortness of breath throughout home visit -- denies recent issues around breathing status, shortness of breath  -- has been monitoring/ recording daily weights, and is able to verbalize accurate understanding of rationale for doing so; recorded weights were reviewed with patient today:  Since last [facility] RN CCM home visit weights have ranged from 176-179 consistently; however, today's weight noted at 181.2 lbs:  Weight gain guidelines with CHF zones reiterated with patient, and we had an extensive discussion around importance of continuing to monitor and record weights every day, as an early indicator of potential problems; patient verbalizes understanding and agreement.  Reiterated weight gain guidleines with patient to cotntact provider for weight gain >3 lbs overnight/ 5 lbs in one week -- confirms that she has spoken to her "insurance nurse" and that she is expecting insurance company to send new scales 'any day now."  -- reports "in green zone today," and is able to verbalize accurate understanding of signs/ symptoms CHF zones along with corresponding action plans with minimal prompting/ reminding-- encouraged patient to continue reviewing and referring to CHF zone chart as needed  Patient denies further issues, concerns, or problems today.I confirmed that patient hasmy direct phone number, the main [facility] CM office phone number, and the [facility] CM 24-hour nurse advice phone number should issues arise prior to next scheduled [place] scheduled telephone callin 2 weeks post-scheduled provider appointments.Encouraged patient to contact me directly if needs,  questions, issues, or concerns arise prior to next scheduled outreach; patient  agreed to do so.  Objective:    BP 124/68   Pulse 90   Resp 16   Wt 181 lb 3.2 oz (82.2 kg)   SpO2 94% Comment: Room Air  BMI 29.25 kg/m   Review of Systems  Constitutional: Positive for malaise/fatigue.       Obese  Respiratory: Positive for shortness of breath. Negative for cough and wheezing.        Continuing using home O2 at 2 L/min; has started weaning self from O2, reports tolerating well.  Confirms shortness of breath intermittently with activity; none demonstrated during home visit today  Cardiovascular: Positive for leg swelling. Negative for chest pain and palpitations.       Patient has +2-3 edema in bilateral LE, from knees to feet; wearing compression stockings; reports as baseline; seems somewhat increased from initial [place] home visit  Gastrointestinal: Negative.  Negative for abdominal pain and nausea.  Genitourinary: Positive for frequency and urgency.       Diuretic therapy  Musculoskeletal: Positive for joint pain and neck pain. Negative for falls.       Chronic knee and neck pain reported today; patient reports acute neck pain flare x "last couple of weeks"  Neurological: Positive for tingling, tremors and headaches.       All reported as "occasional"  Psychiatric/Behavioral: Negative for depression. The patient is not nervous/anxious.    Physical Exam  Constitutional: She is oriented to person, place, and time. She appears well-developed and well-nourished. No distress.  Cardiovascular: Regular rhythm and intact distal pulses. Tachycardia present. Exam reveals distant heart sounds.  Pulses:      Radial pulses are 2+ on the right side, and 2+ on the left side.  Unable to definitively palpate bilateral DP-- due to ongoing LE edema  Respiratory: Effort normal and breath sounds normal. No respiratory distress. She has no wheezes. She has no rales.  Slightly diminished breath sounds  x A/P auscultation  Musculoskeletal: She exhibits edema.  See ROS  Neurological: She is alert and oriented to person, place, and time.  Skin: Skin is warm and dry. No erythema.  Psychiatric: She has a normal mood and affect. Her behavior is normal. Judgment and thought content normal.   Encounter Medications:   Outpatient Encounter Medications as of 08/14/2017  Medication Sig Note  . aspirin EC 325 MG EC tablet Take 1 tablet (325 mg total) by mouth daily.   . calcitRIOL (ROCALTROL) 0.25 MCG capsule Take 1 capsule (0.25 mcg total) by mouth every other day. Please take every Monday, Wednesday, Friday   . calcium carbonate (TUMS - DOSED IN MG ELEMENTAL CALCIUM) 500 MG chewable tablet Chew 2 tablets by mouth daily as needed for indigestion or heartburn.   . carvedilol (COREG) 25 MG tablet Take 0.5 tablets (12.5 mg total) by mouth 2 (two) times daily with a meal.   . cyclobenzaprine (FLEXERIL) 5 MG tablet Take 1 tablet (5 mg total) by mouth 3 (three) times daily as needed for muscle spasms.   Marland Kitchen FAMOTIDINE PO Take 1 tablet by mouth 2 (two) times daily as needed (gas).   . fluticasone (FLONASE) 50 MCG/ACT nasal spray Place 2 sprays daily into both nostrils. (Patient taking differently: Place 2 sprays into both nostrils daily as needed for allergies. ) 08/14/2017: Reports Has not needed recently  . furosemide (LASIX) 80 MG tablet Take 2 tablets (160 mg total) by mouth 2 (two) times daily.   Marland Kitchen gabapentin (NEURONTIN) 300 MG capsule TAKE  1 CAPSULE(300 MG) BY MOUTH THREE TIMES DAILY AS NEEDED FOR PAIN 07/16/2017: Takes as needed; has not needed recently  . hydrALAZINE (APRESOLINE) 25 MG tablet Take 1 tablet (25 mg total) by mouth 2 (two) times daily.   . hydrocortisone (ANUSOL-HC) 2.5 % rectal cream Place 1 application rectally as needed. (Patient taking differently: Place 1 application rectally daily as needed for hemorrhoids. ) 08/14/2017: Reports has not needed recently  . ondansetron (ZOFRAN) 4 MG tablet Take  1 tablet (4 mg total) by mouth every 8 (eight) hours as needed for nausea or vomiting. 07/16/2017: Takes only as needed, has not needed recently   . pantoprazole (PROTONIX) 40 MG tablet Take 1 tablet (40 mg total) daily by mouth. (Patient taking differently: Take 40 mg by mouth 2 (two) times daily as needed (indigestion). ) 07/16/2017: Reports takes as needed; reports has not needed recently  . Propylene Glycol (SYSTANE BALANCE) 0.6 % SOLN Place 1 drop into both eyes daily as needed (for dry eyes).   . traMADol (ULTRAM) 50 MG tablet Take 1 tablet (50 mg total) by mouth every 12 (twelve) hours as needed for severe pain. 07/16/2017: Takes only as needed  . triamcinolone ointment (KENALOG) 0.5 % Apply 1 application topically daily as needed (itching). 08/14/2017: Reports has not needed recently  . ciprofloxacin (CIPRO) 250 MG tablet Take 1 tablet (250 mg total) by mouth 2 (two) times daily. (Patient not taking: Reported on 08/14/2017) 08/14/2017: Reports completed this medication- reports took as prescribed   No facility-administered encounter medications on file as of 08/14/2017.    Functional Status:   In your present state of health, do you have any difficulty performing the following activities: 06/26/2017 06/20/2017  Hearing? N -  Vision? N -  Difficulty concentrating or making decisions? N N  Walking or climbing stairs? Y Y  Comment - -  Dressing or bathing? Y N  Doing errands, shopping? Y Y  Comment - Husband provides Copywriter, advertising and eating ? - Y  Comment - Husband assists with food preparation; no problems eating  Using the Toilet? - N  In the past six months, have you accidently leaked urine? - Y  Do you have problems with loss of bowel control? - N  Managing your Medications? - Y  Comment - Husband assists  Managing your Finances? - Y  Comment - Husband assists  Housekeeping or managing your Housekeeping? - Y  Comment - Husband/ family assists  Some recent data might be  hidden   Fall/Depression Screening:    Fall Risk  07/30/2017 07/24/2017 07/16/2017  Falls in the past year? (No Data) (No Data) No  Comment Patient denies new/ recent falls today Patient denies new/ recent falls Patient denies new/ recent falls  Risk for fall due to : - - Impaired balance/gait;Impaired mobility;Medication side effect;Other (Comment)  Risk for fall due to: Comment - - Home environment- high fall risk   PHQ 2/9 Scores 06/20/2017 01/22/2017 12/08/2015 06/29/2012  PHQ - 2 Score 0 0 0 1   Plan:  Patient will take all medications as prescribed and will attend all scheduled provider appointments  Patient will actively participate in home health services as ordered post-hospital discharge as long as they are active, and will consider having these services extended as indicated  Patient will continue monitoring and recording daily weights  I will place referral for [facility] CSW to explore options around ramp installation, transportation options, and possibility of mobile meals  Patient will continue  using assistive devices for ambulation  Patient will promptly notify care providers for any new concerns, issues, or problems that arise  I will share today's [place] home visit notes and care plan with patient's PCP and vascular surgeon  [place] to continue with scheduled phone callin 2 weeks and routine home visit next month   [facility] CM Care Plan Problem One     Most Recent Value  Care Plan Problem One  Risk for hospital re-admission related to/ as evidenced by 2 recent hospital admissions within 30 days of one another   Role Documenting the Problem One  Care Management Coordinator  Care Plan for Problem One  Not Active  [facility] Long Term Goal   Over the next 31 days, patient will not experience hospital readmission, as evidenced by patient reporting and review of EMR during [facility] RN CCM outreach  [place] Term Goal Start Date  07/03/17 [Goal re-established  today]  [facility] Long Term Goal Met Date  08/14/17 - Goal met  Interventions for Problem One Long Term Goal  Confirmed that patient did not experience hospital readmission within 31 days of last discharge [Goal Met]  [facility] CM Short Term Goal #1   Over the next 30 days, patient will actively participate in home health services as ordered post-hospital discharge  [facility] CM Short Term Goal #1 Start Date  07/03/17 [Goal re-established]  [facility] CM Short Term Goal #1 Met Date  08/14/17 - Goal met  Interventions for Short Term Goal #1  Confirmed that patient continues to participate in home health heath services [Goal met]    [facility] CM Care Plan Problem Two     Most Recent Value  Care Plan Problem Two  Knowledge deficit regarding self-health management of chronic disease state of CHF, as evidenced by patient reporting   Role Documenting the Problem Two  Care Management Coordinator  Care Plan for Problem Two  Active  Interventions for Problem Two Long Term Goal   Reviewed with patient CHF zones and corresponding action plans,  confirmed that patient is able to discuss CHF zones and action plans accurately  [facility] Long Term Goal  Over the next 45 days, patient will be able to discuss CHF zones and corresponding action plan for yellow zone, as evidenced by patient reporting during [place] outreach  [facility] Long Term Goal Start Date  07/16/17  [facility] CM Short Term Goal #1   Over the next 30 days, patient will monitor and record daily weights, as evidenced by patient reporting and review of same during [place] outreach   [facility] CM Short Term Goal #1 Start Date  08/14/17 [Goal extended today 08/14/17]  Interventions for Short Term Goal #2   Reviewed all recorded weights with patient and discussed her insurance plan benefit to obtain new scales,  positive reinforcement provided for patient continuing to monitor and record daily weights,  encouraged patient to continue this practice and reiterated weight gain guidelines in setting of CHF  with patient  [facility] CM Short Term Goal #2   Over the next 30 days, patient will attend all scheduled provider appointments, as evidenced by patient reporting and review of EMR/ collaboration with providers, during [facility] RN CCM outreach  [facility] CM Short Term Goal #2 Start Date  08/14/17  Interventions for Short Term Goal #2  Reviewed all upcoming provider appointments with patient,  discussed with patient necessity of promptly notifying providers for weight gain, new concerns/ issues/ problems,  discussed patient's ability to get in and out of her husband's vehicle and possibility of having other transportation options available to her    [facility] CM Care Plan Problem Three     Most Recent Value  Care Plan Problem Three  Need for [employer] information, as evidenced by patient reporting  Role Documenting the Problem Three  Care Management [place] for Problem Three  Active  [facility] Long Term Goal   Over the next 31 days, patient will discuss her community resource needs with [facility] CSW, as evidenced by patient reporting and collaboration with [facility] CSW during [place] outreach  [place] Term Goal Start Date  08/14/17  Interventions for Problem Three Long Term Goal  Discussed role of [facility] CSW with patient,  discussed possibility of CSW being able to assist with ramp installation at patient's home, with transportation options, and with mobile meals,  placed [facility] CSW referral     [name], RN, BSN, [place] Coordinator [facility] Care Management  [phone]

## 2017-08-18 ENCOUNTER — Other Ambulatory Visit: Payer: Self-pay | Admitting: *Deleted

## 2017-08-18 DIAGNOSIS — R0602 Shortness of breath: Secondary | ICD-10-CM | POA: Diagnosis not present

## 2017-08-18 NOTE — Patient Outreach (Signed)
[facility]) Care Management [place] Telephone Outreach Care Coordination  08/18/2017  [name] 04-Oct-1945 [id number]  Successful telephone outreach for care coordination to [name], 72 y.o. female referred to [place] after recent hospital visit February 14-June 16, 2017 for shortness of breath with hypoxia and pulmonary edema; patient noted to have aortic insufficiency and subacute dissection of aortic root; surgical AVR and repair of type A chronic ascending thoracic aortic dissection was completed on 05/30/17.Patient has history including, but not limited to, CKD; HTN; dCHF; chronic pericardial effusion secondary to CHF, and aortic insufficiency. Patient was discharged home with home health services through [place] in place for RN/ PT/ OT.Unfortunately, patient experienced hospital re-admission 10 days after hospital discharge, 3/20-25/ 2019 for fever, AKI/ dehydration, and weakness, and was diagnosed with sepsis/ UTI. Patient was discharged home after hospital visits with home health services in place. HIPAA/ identity verified with patient during phone call today.  Explained to patient that I was contacting her today on request of Dr. Prescott Gum, who has asked me to contact patient after [place] RN CM home visit last week; Dr. Prescott Gum had requested that I encourage patient to continue keeping her legs elevated as much as possible; patient reported that she had been doing so and would continue to do so.  Patient confirmed that she plans to attend office visit with Dr. Prescott Gum on Wednesday 08/20/17; patient further reported that her weight this morning was 179 lbs (down from last reported weight of 181 lbs).  Patient denies further issues, concerns, or problems today.  I confirmed that patient has my direct phone number, the main [facility] CM office phone number, and the [facility] CM 24-hour nurse advice phone number should issues arise prior to next  scheduled [place] outreach.  Encouraged patient to contact me directly if needs, questions, issues, or concerns arise prior to next scheduled outreach; patient agreed to do so.  Plan:  Will collaborate with patient's care providers as indicated   [place] RN CM telephone outreach to patient as scheduled next week  [name], RN, BSN, [place] Care Management  [phone]

## 2017-08-19 ENCOUNTER — Telehealth: Payer: Self-pay | Admitting: Internal Medicine

## 2017-08-19 ENCOUNTER — Other Ambulatory Visit: Payer: Self-pay | Admitting: Licensed Clinical Social Worker

## 2017-08-19 DIAGNOSIS — E8770 Fluid overload, unspecified: Secondary | ICD-10-CM | POA: Diagnosis not present

## 2017-08-19 DIAGNOSIS — M542 Cervicalgia: Secondary | ICD-10-CM

## 2017-08-19 DIAGNOSIS — N184 Chronic kidney disease, stage 4 (severe): Secondary | ICD-10-CM | POA: Diagnosis not present

## 2017-08-19 DIAGNOSIS — N2581 Secondary hyperparathyroidism of renal origin: Secondary | ICD-10-CM | POA: Diagnosis not present

## 2017-08-19 DIAGNOSIS — I129 Hypertensive chronic kidney disease with stage 1 through stage 4 chronic kidney disease, or unspecified chronic kidney disease: Secondary | ICD-10-CM | POA: Diagnosis not present

## 2017-08-19 DIAGNOSIS — D631 Anemia in chronic kidney disease: Secondary | ICD-10-CM | POA: Diagnosis not present

## 2017-08-19 LAB — CBC AND DIFFERENTIAL
HEMATOCRIT: 28 — AB (ref 36–46)
Hemoglobin: 9 — AB (ref 12.0–16.0)
NEUTROS ABS: 7
Platelets: 255 (ref 150–399)
WBC: 8.8

## 2017-08-19 LAB — BASIC METABOLIC PANEL
BUN: 53 — AB (ref 4–21)
Creatinine: 2.5 — AB (ref 0.5–1.1)
GLUCOSE: 99
POTASSIUM: 3.2 — AB (ref 3.4–5.3)
Sodium: 142 (ref 137–147)

## 2017-08-19 LAB — HEPATIC FUNCTION PANEL
ALT: 9 (ref 7–35)
AST: 18 (ref 13–35)
Alkaline Phosphatase: 67 (ref 25–125)
BILIRUBIN, TOTAL: 0.7

## 2017-08-19 MED ORDER — LIDOCAINE 5 % EX PTCH: 1.0000 | MEDICATED_PATCH | CUTANEOUS | 0 refills | Status: DC

## 2017-08-19 NOTE — Telephone Encounter (Signed)
We sent in flexeril, did this help?

## 2017-08-19 NOTE — Telephone Encounter (Signed)
Patient informed of MD response and stated understanding. Patient also wanted to let us know that she is still having these neck spasms that will radiate up the back of her head and hurt. States that she has brought it up in visits before and is wanting a referral to a neck specialist.

## 2017-08-19 NOTE — Addendum Note (Signed)
Addended by: Pricilla Holm A on: 08/19/2017 03:56 PM   Modules accepted: Orders

## 2017-08-19 NOTE — Telephone Encounter (Signed)
Copied from [place] [phone]. Topic: Inquiry >> Aug 19, 2017 12:59 PM [name], NT wrote: Reason for CRM: patient is calling and states she just left her kidney doctor Mauricia Area and states she has a lot of swelling in her back that he is controlling. She states she is stiff and is really sore and Dr.Deterding wanted to know if Dr. Sharlet Salina could call the patient in something for that and if she needed to talk to him that he would be willing to do so. Please advise.   [facility] [address], [place] [id number] Phone: [phone] Fax: [phone]

## 2017-08-19 NOTE — Telephone Encounter (Signed)
Have sent in lidocaine patch which she can put where it hurts. If this does not work she may have to come in for visit as all other options with her kidney function are controlled and would need visit to prescribe.

## 2017-08-19 NOTE — Patient Outreach (Signed)
[facility]) Care Management  08/19/2017  Lydiah Pong 31-Oct-1945 [id number]  Assessment- [facility] CSW received new referral from [place] on 08/15/17. Request to contact RNCM first before completing initial outreach to patient. [facility] CSW contacted [place] and discussed case. Patient is a significantly crowded home environment which has ongoing signs of hoarding. Patient has limited pathways around her home environment and [place] notified [facility] CSW that there will be no place for CSW to sit during home visit. Patient continues to report that she is used to how her house is and does not want to make any changes and likes the way she has it. However, patient is agreeable to gaining a ramp as it has been difficult for her husband to get her down the front steps and into her wheelchair in order to get patient to medical appointments. [facility] CSW completed initial outreach attempt and was able to successfully reach patient. HIPPA verifications were successfully received. [facility] CSW introduced self, reason for call and of [facility] social work services. Patient reports needing a ramp, possibly mobile meals and transportation benefit education. [facility] CSW scheduled home visit for 08/21/17. Patient reports having to get off the phone as she has a medical appointment today that she has to get ready for but was appreciative of phone call and information.  Plan-[facility] CSW will send involvement letter to PCP. [facility] CSW will update [facility] RNCM and will complete home visit this week.  Eula Fried, BSW, MSW, [email or web address] Phone: [phone] Fax: [phone]

## 2017-08-19 NOTE — Telephone Encounter (Signed)
LVM for patient to call back and let us know if the Flexeril helped that was sent in for her previously for her back

## 2017-08-19 NOTE — Telephone Encounter (Signed)
Patient had called back and stated the the flexeril has not been helping at all

## 2017-08-20 ENCOUNTER — Telehealth: Payer: Self-pay

## 2017-08-20 ENCOUNTER — Encounter: Payer: Self-pay | Admitting: Cardiothoracic Surgery

## 2017-08-20 DIAGNOSIS — I130 Hypertensive heart and chronic kidney disease with heart failure and stage 1 through stage 4 chronic kidney disease, or unspecified chronic kidney disease: Secondary | ICD-10-CM | POA: Diagnosis not present

## 2017-08-20 DIAGNOSIS — N184 Chronic kidney disease, stage 4 (severe): Secondary | ICD-10-CM | POA: Diagnosis not present

## 2017-08-20 DIAGNOSIS — G629 Polyneuropathy, unspecified: Secondary | ICD-10-CM | POA: Diagnosis not present

## 2017-08-20 DIAGNOSIS — I5030 Unspecified diastolic (congestive) heart failure: Secondary | ICD-10-CM | POA: Diagnosis not present

## 2017-08-20 DIAGNOSIS — I872 Venous insufficiency (chronic) (peripheral): Secondary | ICD-10-CM | POA: Diagnosis not present

## 2017-08-20 NOTE — Addendum Note (Signed)
Addended by: Pricilla Holm A on: 08/20/2017 07:53 AM   Modules accepted: Orders

## 2017-08-20 NOTE — Telephone Encounter (Signed)
Referral placed.

## 2017-08-20 NOTE — Telephone Encounter (Signed)
PA started on CoverMyMeds KEY: [id number]

## 2017-08-21 ENCOUNTER — Other Ambulatory Visit: Payer: Self-pay | Admitting: Licensed Clinical Social Worker

## 2017-08-21 ENCOUNTER — Telehealth: Payer: Self-pay | Admitting: Internal Medicine

## 2017-08-21 DIAGNOSIS — I5030 Unspecified diastolic (congestive) heart failure: Secondary | ICD-10-CM | POA: Diagnosis not present

## 2017-08-21 DIAGNOSIS — G629 Polyneuropathy, unspecified: Secondary | ICD-10-CM | POA: Diagnosis not present

## 2017-08-21 DIAGNOSIS — N184 Chronic kidney disease, stage 4 (severe): Secondary | ICD-10-CM | POA: Diagnosis not present

## 2017-08-21 DIAGNOSIS — I130 Hypertensive heart and chronic kidney disease with heart failure and stage 1 through stage 4 chronic kidney disease, or unspecified chronic kidney disease: Secondary | ICD-10-CM | POA: Diagnosis not present

## 2017-08-21 DIAGNOSIS — I872 Venous insufficiency (chronic) (peripheral): Secondary | ICD-10-CM | POA: Diagnosis not present

## 2017-08-21 NOTE — Patient Outreach (Signed)
[facility]) Care Management  [facility] Social Work  08/21/2017  [name] 03/11/1946 [id number]  Encounter Medications:  Outpatient Encounter Medications as of 08/21/2017  Medication Sig Note  . aspirin EC 325 MG EC tablet Take 1 tablet (325 mg total) by mouth daily.   . calcitRIOL (ROCALTROL) 0.25 MCG capsule Take 1 capsule (0.25 mcg total) by mouth every other day. Please take every Monday, Wednesday, Friday   . calcium carbonate (TUMS - DOSED IN MG ELEMENTAL CALCIUM) 500 MG chewable tablet Chew 2 tablets by mouth daily as needed for indigestion or heartburn.   . carvedilol (COREG) 25 MG tablet Take 0.5 tablets (12.5 mg total) by mouth 2 (two) times daily with a meal.   . ciprofloxacin (CIPRO) 250 MG tablet Take 1 tablet (250 mg total) by mouth 2 (two) times daily. (Patient not taking: Reported on 08/14/2017) 08/14/2017: Reports completed this medication- reports took as prescribed  . cyclobenzaprine (FLEXERIL) 5 MG tablet Take 1 tablet (5 mg total) by mouth 3 (three) times daily as needed for muscle spasms.   Marland Kitchen FAMOTIDINE PO Take 1 tablet by mouth 2 (two) times daily as needed (gas).   . fluticasone (FLONASE) 50 MCG/ACT nasal spray Place 2 sprays daily into both nostrils. (Patient taking differently: Place 2 sprays into both nostrils daily as needed for allergies. ) 08/14/2017: Reports Has not needed recently  . furosemide (LASIX) 80 MG tablet Take 2 tablets (160 mg total) by mouth 2 (two) times daily.   Marland Kitchen gabapentin (NEURONTIN) 300 MG capsule TAKE 1 CAPSULE(300 MG) BY MOUTH THREE TIMES DAILY AS NEEDED FOR PAIN 07/16/2017: Takes as needed; has not needed recently  . hydrALAZINE (APRESOLINE) 25 MG tablet Take 1 tablet (25 mg total) by mouth 2 (two) times daily.   . hydrocortisone (ANUSOL-HC) 2.5 % rectal cream Place 1 application rectally as needed. (Patient taking differently: Place 1 application rectally daily as needed for hemorrhoids. ) 08/14/2017: Reports has not needed recently   . lidocaine (LIDODERM) 5 % Place 1 patch onto the skin daily. Remove & Discard patch within 12 hours or as directed by MD   . ondansetron (ZOFRAN) 4 MG tablet Take 1 tablet (4 mg total) by mouth every 8 (eight) hours as needed for nausea or vomiting. 07/16/2017: Takes only as needed, has not needed recently   . pantoprazole (PROTONIX) 40 MG tablet Take 1 tablet (40 mg total) daily by mouth. (Patient taking differently: Take 40 mg by mouth 2 (two) times daily as needed (indigestion). ) 07/16/2017: Reports takes as needed; reports has not needed recently  . Propylene Glycol (SYSTANE BALANCE) 0.6 % SOLN Place 1 drop into both eyes daily as needed (for dry eyes).   . traMADol (ULTRAM) 50 MG tablet Take 1 tablet (50 mg total) by mouth every 12 (twelve) hours as needed for severe pain. 07/16/2017: Takes only as needed  . triamcinolone ointment (KENALOG) 0.5 % Apply 1 application topically daily as needed (itching). 08/14/2017: Reports has not needed recently   No facility-administered encounter medications on file as of 08/21/2017.     Functional Status:  In your present state of health, do you have any difficulty performing the following activities: 06/26/2017 06/20/2017  Hearing? N -  Vision? N -  Difficulty concentrating or making decisions? N N  Walking or climbing stairs? Y Y  Comment - -  Dressing or bathing? Y N  Doing errands, shopping? Y Y  Comment - Husband provides Copywriter, advertising and eating ? -  Y  Comment - Husband assists with food preparation; no problems eating  Using the Toilet? - N  In the past six months, have you accidently leaked urine? - Y  Do you have problems with loss of bowel control? - N  Managing your Medications? - Y  Comment - Husband assists  Managing your Finances? - Y  Comment - Husband assists  Housekeeping or managing your Housekeeping? - Y  Comment - Husband/ family assists  Some recent data might be hidden    Fall/Depression Screening:  PHQ  2/9 Scores 08/21/2017 06/20/2017 01/22/2017 12/08/2015 06/29/2012  PHQ - 2 Score 0 0 0 0 1    Assessment: [facility] CSW arrived at patient's residence on 08/21/17 and patient's spouse let [facility] CSW in. Patient alert and oriented x 4. Patient is interested in community resource involvement. Patient shares that she would greatly benefit from a ramp as her husband has difficulty with getting her down the front step as well as with the transfer into the wheelchair. [facility] CSW provided education on the [employer] and their enrollment process. Patient agreeable to [facility] CSW completing referral to [employer] and is agreeable to following up with all program requirements which includes a financial assessment. [facility] CSW completed [employer] with [employer] and left a detailed voice message with referral. Patient should receive a follow up call from [employer] [employer] and [facility] CSW will follow up within one week to make sure this was completed. [facility] CSW provided transportation resource education including transportation benefits through her [employer], [employer], [employer] and [employer]. Patient denies wanting to use any these transportation resources and does not wish social work assistance with this. Patient was encouraged to contact this [facility] CSW in the future if she changes her mind. Family expressed interest in Mobile Me[employer] and was provided education on their eligibility requirements, program benefits and current wait list time. Family wishes for [facility] CSW to make both patient and spouse on wait list for [employer]. [facility] CSW was successful in doing this. Family will be contacted in the future to set up an in home assessment once their name moves up on the wait list. Patient expressed understanding. Patient denies any interest in completing advance directives. Patient denies any depressive symptoms but admits that she has suffered from grief over the loss of her  son a few years ago. Patient reports being able to come to terms with this loss and denies needing grief support resources but appreciative of offer. Patient denies any further social work needs and reports only wanting assistance with gaining a ramp. Patient's house is very crowded and patient shows signs of significant hoarding. [facility] CSW questioned this and was told that she does not wish to make any changes to her home and does not see any problems with it. [facility] CSW will follow up with [employer] within one week to follow up on referral placed today.  [facility] CM Care Plan Problem One     Most Recent Value  Care Plan Problem One  Need for community resource support and education  Role Documenting the Problem One  Clinical Social Worker  Care Plan for Problem One  Active  [facility] Long Term Goal   Patient will follow through the application process through [employer] over the next 90 days in order ot gain a ramp infront of her home to prevent falls.   [facility] Long Term Goal Start Date  08/21/17  Interventions for Problem One Long Term Goal  [facility] CSW completed call to [facility] [employer] and made referral. [facility] CSW will follow this process and monitor closely. [facility] CSW provided detailed steps and education on this entire process to patient today during home visit.     Plan- [facility] CSW will follow up with [employer] within one week and will route encounter to [place] and patient's PCP.  Eula Fried, BSW, MSW, [email or web address] Phone: [phone] Fax: [phone]     Plan:

## 2017-08-21 NOTE — Telephone Encounter (Signed)
PA denied.

## 2017-08-21 NOTE — Telephone Encounter (Signed)
Copied from [place] [id number]. Topic: Quick Communication - See Telephone Encounter >> Aug 21, 2017  2:47 PM Synthia Innocent wrote: CRM for notification. See Telephone encounter for: 08/21/17. Weight yesterday was 172 at kidney dr, today at home 176.8. Patient states kidney dr increased fluid pills. Patient had just ate. Also need verbal order for Nursing eval, also extend PT 1x a week for 5 weeks.

## 2017-08-22 ENCOUNTER — Telehealth: Payer: Self-pay | Admitting: Adult Health

## 2017-08-22 NOTE — Telephone Encounter (Signed)
Notified Sree w/MD response.../lmb 

## 2017-08-22 NOTE — Telephone Encounter (Signed)
Okay for verbals °

## 2017-08-23 NOTE — Progress Notes (Signed)
Cardiology Office Note   Date:  08/25/2017   ID:  [name], DOB 1945/05/16, MRN [id number]  PCP:  Martha Koch, MD  Cardiologist:  Dr. Percival Myers   Chief Complaint  Patient presents with  . Hospitalization Follow-up    surgery follow up, pt deniess chest pains, noted swelling in feet, denies SOB  . Congestive Heart Failure  . Hypertension     History of Present Illness: [name] is a 72 y.o. female who presents for ongoing assessment and management of chronic pericardial effusion diagnosed in 2018 with progressive enlargement) chronic diastolic heart failure, EF of 60% to 65% with moderate aortic insufficiency and mitral regurg with grade 1 diastolic dysfunction (echo 04/20/2017), Type a AAA status post repair, chronic left bundle branch block, coronary artery disease,  chronic bilateral lower extremity edema, hypertension, chronic kidney disease stage III. Recent hospitalization March 2019 in the setting of sepsis most likely urinary.  She was last seen by cardiology on 06/16/2017 during above hospitalization.  The patient was continued on current medication regimen of Lasix 160 mg p.o. BID, she was continued on antihypertensive medications.  She was stable status post AVR with aortic root replacement.   She was seen by Dr.Deterding, nephrologist on 08/22/2017 and was found to be volume overloaded. He increased lasix to 160 mg TID for one week and then to decrease dose back to 160 mg BID thereafter. She was up about 15-20 lbs over baseline. Hydralazine was discontinued.   She is here today with continued complaints of lower extremity edema generalized weakness fatigue neck and back pain.  The patient has not been taking her medications as prescribed by Dr. Jimmy Footman as described above 3 days prior.  She is only taking the Lasix twice a day, and is continued to take hydralazine.  She is not taken any of her medications today prior to this office visit,;she was afraid to stop it  blood pressure.  The patient also continues to eat salty food and is not adhered to low-sodium diet.  She states that her sister cooks for her and does not add salt.  She occasionally eats fast food that her brother-in-law brings to her for lunch.  Past Medical History:  Diagnosis Date  . Acute pancreatitis 02/04/2017   [name] 02/04/2017  . Adenomatous colon polyp 01/2002  . Anemia, chronic disease    /notes 02/04/2017  . Aortic valve disease    AI/AS  . Arthritis    "lower back, knees" (02/04/2017)  . CHF (congestive heart failure) ([place])   . CKD (chronic kidney disease), stage III ([place])   . Diverticulosis of colon   . DJD (degenerative joint disease)   . Frequent UTI   . Hemorrhoids   . Hypertension   . Lumbar back pain   . Neuropathy   . Obesity   . Pericardial effusion     in a patient with Diastolic heart  failure and Pericardial effusion  known since last 2 D echo in 11/2016 /notes 02/04/2017  . PONV (postoperative nausea and vomiting)   . Pulmonary HTN ([place])   . Renal cyst   . Venous insufficiency   . Vitamin D deficiency     Past Surgical History:  Procedure Laterality Date  . ABDOMINAL HYSTERECTOMY     "partial"  . AORTIC VALVE REPLACEMENT N/A 05/30/2017   Procedure: AORTIC VALVE REPLACEMENT (AVR);  Surgeon: Prescott Gum, Collier Salina, MD;  Location: [place];  Service: Vascular;  Laterality: N/A;  . COLONOSCOPY W/ BIOPSIES AND POLYPECTOMY     "  bx was ok"  . IR THORACENTESIS ASP PLEURAL SPACE W/IMG GUIDE  06/11/2017  . IR THORACENTESIS ASP PLEURAL SPACE W/IMG GUIDE  06/12/2017  . REPAIR OF ACUTE ASCENDING THORACIC AORTIC DISSECTION N/A 05/30/2017   Procedure: REPLACEMENT OF ASCENDING AORTIC ANEURYSM AND REPAIRED CHRONIC ROOT DISSECTION WITH CIRC ARREST;  Surgeon: Ivin Poot, MD;  Location: [place];  Service: Vascular;  Laterality: N/A;  . TEE WITHOUT CARDIOVERSION N/A 05/28/2017   Procedure: TRANSESOPHAGEAL ECHOCARDIOGRAM (TEE);  Surgeon: Skeet Latch, MD;  Location: [place];  Service: Cardiovascular;  Laterality: N/A;  . TEE WITHOUT CARDIOVERSION N/A 05/30/2017   Procedure: TRANSESOPHAGEAL ECHOCARDIOGRAM (TEE);  Surgeon: Prescott Gum, Collier Salina, MD;  Location: [place];  Service: Open Heart Surgery;  Laterality: N/A;     Current Outpatient Medications  Medication Sig Dispense Refill  . aspirin EC 325 MG EC tablet Take 1 tablet (325 mg total) by mouth daily. 30 tablet 0  . calcitRIOL (ROCALTROL) 0.25 MCG capsule Take 1 capsule (0.25 mcg total) by mouth every other day. Please take every Monday, Wednesday, Friday 30 capsule 1  . calcium carbonate (TUMS - DOSED IN MG ELEMENTAL CALCIUM) 500 MG chewable tablet Chew 2 tablets by mouth daily as needed for indigestion or heartburn.    . cyclobenzaprine (FLEXERIL) 5 MG tablet Take 1 tablet (5 mg total) by mouth 3 (three) times daily as needed for muscle spasms. 30 tablet 0  . FAMOTIDINE PO Take 1 tablet by mouth 2 (two) times daily as needed (gas).    . fluticasone (FLONASE) 50 MCG/ACT nasal spray Place 2 sprays daily into both nostrils. (Patient taking differently: Place 2 sprays into both nostrils daily as needed for allergies. ) 16 g 2  . furosemide (LASIX) 80 MG tablet Take 2 tablets (160 mg total) by mouth 2 (two) times daily. TAKE TID X1 WEEK THEN BACK TO BID 90 tablet 1  . gabapentin (NEURONTIN) 300 MG capsule TAKE 1 CAPSULE(300 MG) BY MOUTH THREE TIMES DAILY AS NEEDED FOR PAIN 90 capsule 0  . hydrocortisone (ANUSOL-HC) 2.5 % rectal cream Place 1 application rectally as needed. (Patient taking differently: Place 1 application rectally daily as needed for hemorrhoids. ) 30 g 0  . lidocaine (LIDODERM) 5 % Place 1 patch onto the skin daily. Remove & Discard patch within 12 hours or as directed by MD 30 patch 0  . ondansetron (ZOFRAN) 4 MG tablet Take 1 tablet (4 mg total) by mouth every 8 (eight) hours as needed for nausea or vomiting. 20 tablet 0  . pantoprazole (PROTONIX) 40 MG tablet Take 1 tablet (40 mg total) daily by  mouth. (Patient taking differently: Take 40 mg by mouth 2 (two) times daily as needed (indigestion). ) 30 tablet 0  . Propylene Glycol (SYSTANE BALANCE) 0.6 % SOLN Place 1 drop into both eyes daily as needed (for dry eyes).    . traMADol (ULTRAM) 50 MG tablet Take 1 tablet (50 mg total) by mouth every 12 (twelve) hours as needed for severe pain. 28 tablet 0  . triamcinolone ointment (KENALOG) 0.5 % Apply 1 application topically daily as needed (itching).    . carvedilol (COREG) 25 MG tablet Take 0.5 tablets (12.5 mg total) by mouth 2 (two) times daily with a meal. 60 tablet 0  . ciprofloxacin (CIPRO) 250 MG tablet Take 1 tablet (250 mg total) by mouth 2 (two) times daily. (Patient not taking: Reported on 08/14/2017) 14 tablet 0   No current facility-administered medications for this visit.  Allergies:   Minoxidil and Naproxen    Social History:  The patient  reports that she quit smoking about 35 years ago. Her smoking use included cigarettes. She has a 6.00 pack-year smoking history. She has never used smokeless tobacco. She reports that she does not drink alcohol or use drugs.   Family History:  The patient's family history includes Colon polyps in her mother; Diabetes in her mother; Hypertension in her brother, mother, and sister.    ROS: All other systems are reviewed and negative. Unless otherwise mentioned in H&P    PHYSICAL EXAM: VS:  BP (!) 148/80 (BP Location: Right Arm)   Pulse 100   Ht 5\' 6"  (1.676 m)   Wt 181 lb (82.1 kg)   BMI 29.21 kg/m  , BMI Body mass index is 29.21 kg/m. GEN: Well nourished, well developed, in no acute distress  HEENT: normal  Neck: no JVD, carotid bruits, or masses Cardiac: RRR;tachycardic, 3/6 holosystolic murmur, no rubs, or gallops, severe 3+ woody pretibial edema  Respiratory:  clear to auscultation bilaterally, normal work of breathing GI: soft, nontender, nondistended, + BS MS: no deformity or atrophy  Skin: warm and dry, no rash Neuro:   Strength and sensation are intact Psych: euthymic mood, full affect   EKG: Repleted during this office visit.  Recent Labs: 11/27/2016: Pro B Natriuretic peptide (BNP) 202.0 05/29/2017: TSH 0.703 05/31/2017: Magnesium 2.2 06/25/2017: ALT 34 06/26/2017: B Natriuretic Peptide 802.3 06/29/2017: BUN 89; Creatinine, Ser 2.96; Hemoglobin 7.6; Platelets 275; Potassium 3.1; Sodium 143    Lipid Panel    Component Value Date/Time   CHOL 141 02/04/2017 2101   TRIG 78 02/04/2017 2101   HDL 49 02/04/2017 2101   CHOLHDL 2.9 02/04/2017 2101   VLDL 16 02/04/2017 2101   LDLCALC 76 02/04/2017 2101      Wt Readings from Last 3 Encounters:  08/25/17 181 lb (82.1 kg)  08/14/17 181 lb 3.2 oz (82.2 kg)  07/23/17 182 lb (82.6 kg)      Other studies Reviewed: TEE 05/30/2017 Result status: Final result   Septum: No Patent Foramen Ovale present.  Left atrium: Patent foramen ovale not present.  Aortic valve: The valve is trileaflet. No stenosis. Moderate to severe regurgitation. No AV vegetation.  Aorta: The aortic root is dilated. Aneurysm present in the ascending aorta, transverse aorta and descending aorta. 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  Mitral valve: Mild regurgitation.  Right ventricle: Normal cavity size, wall thickness and ejection fraction.  Tricuspid valve: Trace regurgitation.  Pericardium: Large circumferential pericardial effusion.  Aorta: Graft present in the ascending aorta.   Echocardiogram 05/23/2017 - Left ventricle: The cavity size was normal. There was severe   concentric hypertrophy. Systolic function was normal. The   estimated ejection fraction was in the range of 60% to 65%. Wall   motion was normal; there were no regional wall motion   abnormalities. Doppler parameters are consistent with abnormal   left ventricular relaxation (grade 1 diastolic dysfunction).   Doppler parameters are consistent with elevated mean  left atrial   filling pressure. - Ventricular septum: Septal motion showed paradox. These changes   are consistent with a left bundle branch block. - Aortic valve: Transvalvular velocity was increased more than   expected, due to high stroke volume. There was mild stenosis.   There was severe regurgitation directed centrally in the LVOT.   Severe regurgitation is suggested by an aortic regurgitation PHT   <=  250 ms and a vena contracta >= 6 mm. Valve area (VTI): 2.05   cm^2. Valve area (Vmax): 1.82 cm^2. Valve area (Vmean): 1.97   cm^2. - Aortic root: Suspicious for aneurysm of the right coronary sinus   of Valsalva or aneurysm of the proximal right coronary artery. - Mitral valve: There was mild to moderate regurgitation directed   eccentrically and posteriorly. - Left atrium: The atrium was mildly dilated. - Pulmonary arteries: Systolic pressure was mildly increased. PA   peak pressure: 43 mm Hg (S). - Pericardium, extracardiac: A moderate to large, free-flowing   pericardial effusion was identified circumferential to the heart.   The fluid had no internal echoes.  SSESSMENT AND PLAN:  1.  Hypertension: The patient is not taking medications as directed.  Salted foods.  She has not taken her medications yet today.  She has continued to take hydralazine although asked to discontinue this on recent office visit with nephrology.  I have advised her to take her medications as directed and to take her medications prior to coming to any further office appointments so that we can ascertain whether medications are providing therapeutic control she is advised on a low-sodium diet again.  2.  Chronic lower extremity edema: The patient is repeatedly been advised to keep her legs elevated, but she states her best to remember to keep her legs elevated fluid retention and venous return to the heart.  Is also advised to take Lasix 3 times daily as instructed, 160 mg 3 times daily for 1 week, and then  return to twice daily.  She verbalizes understanding.  3.  Chronic diastolic heart failure: Patient does have evidence of fluid overload but is not yet taken diuretic this morning nor has she been taking it as directed.  I reinforced her need for medical compliance to avoid recurrent hospitalizations and to aid in diuresis.  She verbalizes understanding.  4.  Severe aortic valve and mitral valve disease: We will need to repeat echocardiogram on next office visit to evaluate progression.  With medical noncompliance issues uncertain if she would be a candidate for surgical repair.  5,. CKD Stage IV:  Followed by Dr. Jimmy Footman   Current medicines are reviewed at length with the patient today.    Labs/ tests ordered today include: None   Phill Myron. [place], ANP, AACC   08/25/2017 9:03 AM    [facility] [address] [place], [place] [address] Phone: [phone]; Fax: [phone]

## 2017-08-25 ENCOUNTER — Encounter: Payer: Self-pay | Admitting: Adult Health

## 2017-08-25 ENCOUNTER — Ambulatory Visit: Payer: Medicare Other | Admitting: Adult Health

## 2017-08-25 VITALS — BP 148/80 | HR 100 | Ht 66.0 in | Wt 181.0 lb

## 2017-08-25 DIAGNOSIS — I313 Pericardial effusion (noninflammatory): Secondary | ICD-10-CM | POA: Diagnosis not present

## 2017-08-25 DIAGNOSIS — R609 Edema, unspecified: Secondary | ICD-10-CM | POA: Diagnosis not present

## 2017-08-25 DIAGNOSIS — I10 Essential (primary) hypertension: Secondary | ICD-10-CM | POA: Diagnosis not present

## 2017-08-25 DIAGNOSIS — I351 Nonrheumatic aortic (valve) insufficiency: Secondary | ICD-10-CM

## 2017-08-25 DIAGNOSIS — I5032 Chronic diastolic (congestive) heart failure: Secondary | ICD-10-CM

## 2017-08-25 DIAGNOSIS — I3139 Other pericardial effusion (noninflammatory): Secondary | ICD-10-CM

## 2017-08-25 MED ORDER — FUROSEMIDE 80 MG PO TABS: 160.0000 mg | ORAL_TABLET | Freq: Two times a day (BID) | ORAL | 1 refills | Status: DC

## 2017-08-25 NOTE — Telephone Encounter (Signed)
ECHO FAXED VIA EPIC

## 2017-08-25 NOTE — Patient Instructions (Signed)
Medication Instructions:  START TAKING FUROSEMIDE THREE TIMES DAILY THEN BACK TO TWICE DAILY ON 09-01-16 If you need a refill on your cardiac medications before your next appointment, please call your pharmacy.  Follow-Up: Your physician wants you to follow-up in: [place].  Thank you for choosing [facility] at [facility]!!

## 2017-08-26 ENCOUNTER — Encounter: Payer: Self-pay | Admitting: *Deleted

## 2017-08-26 ENCOUNTER — Telehealth: Payer: Self-pay

## 2017-08-26 ENCOUNTER — Other Ambulatory Visit: Payer: Self-pay | Admitting: *Deleted

## 2017-08-26 NOTE — Telephone Encounter (Signed)
Copied from [place] [id number]. Topic: Referral - Request >> Aug 26, 2017 11:08 AM Pricilla Handler wrote: Reason for CRM: Patient called wanting to know if Dr. Sharlet Salina would refer her to a Neck Specialist. Patient has been having neck and back pain. Patient would like for Dr. Nathanial Millman nurse to call her today at [phone], so that she can explain more.  Thank You!!!

## 2017-08-26 NOTE — Patient Outreach (Signed)
[facility] [facility]) Care Management [place] Telephone Outreach Post-hospital discharge day 57  08/26/2017  [name] 04/26/1945 [id number]  Successful telephone outreach to [name], 72 y.o.femalereferred to [place] after recent hospital visit February 14-June 16, 2017 for shortness of breath with hypoxia and pulmonary edema; patient noted to have aortic insufficiency and subacute dissection of aortic root; surgical AVR and repair of type A chronic ascending thoracic aortic dissection was completed on 05/30/17.Patient has history including, but not limited to, CKD; HTN; dCHF; chronic pericardial effusion secondary to CHF, and aortic insufficiency. Patient was discharged home with home health services through [facility] in place for RN/ PT/ OT.Unfortunately, patient experienced hospital re-admission 10 days after hospital discharge, 3/20-25/ 2019 for fever, AKI/ dehydration, and weakness, and was diagnosed with sepsis/ UTI. Patient was discharged home after hospital visitswith home health services in place. HIPAA/ identity verified with patient during phone call today.  Today, patient reports she is "doing fine," and she denies concerns/ issues or problems; denies new/ recent falls.  Reports that she attended recent scheduled provider office visits to renal doctor and to cardiologist.  States that providers "changed her fluid pills," due to ongoing weight gain.  Patient sounds to be in no distress throughout today's phone call.  Patient further reports:  -- medications:  Review of cardiology office visit notes from yesterday indicate that patient is to take Lasix 80 mg (2 pills for total dose of 160 mg) TID x 1 week-- however, patient initially reported today that she was told to take Lasix 80 mg (3 pills for a total of 240 mg) TID x 1 week.  I reviewed notes from yesterday's office visit with her, and instructed her on correct dosing instructions-- using  teach-back, patient was eventually able to accurately verbalize correct dosing, per cardiology notes from yesterday's office visit, and she had her office visit instructions with her, and admitted when she read these that she initially had the dosing mixed up.  Patient denies other concerns/ changes around medications, but accurately reports that hydralazine was discontinued, and that she is no longer taking.  Had extensive discussion with patient around importance of taking diuretic therapy exactly as prescribed by care providers.  Discussed with her that these are medications that she should be very careful with, and take according to dosing instructions-- explained that these medications may have side-effects of low blood pressure, dizziness/ lightheadedness, and instructed her to contact her care providers if she experiences any concerning signs/ symptoms as she takes her increased dosing for this week; patient verbalizes understanding and agreement.  -- provider office visits: reports that she had to cancel previously scheduled office visit with Dr. Prescott Gum on Aug 20, 2017, stating that she was "too tired after the office visit with" renal provider the day before; reports that she re-scheduled office visit with Dr. Prescott Gum for Sep 03, 2017; continues to report that her husband will provide transportation to all provider appointments, and she again denies desire to explore other options for transportation resources.  -- confirms that she has spoken with [facility] CSW re: community resource needs for ramp placement at her home and mobile meals on wheels.  -- self-health management of chronic disease state of CHF:    ---- reports "legs still swollen, about like they always are;" reports continues keeping legs elevated as much as possible ---- reports continues monitoring and recording daily weights and reports general daily weight ranges between 177-183 lbs; reports weight at cardiology  office visit  yesterday was 181 lbs; reports weight this morning of 179 lbs ---- continues home O2 at 2 L/min, denies difficulty breathing, increased shortness of breath; continues to admit that she remains short of breath with activity ---- continues "trying to eat right;" at one point during our conversation, she stated that she "hasn't been eating that much, and doesn't understand why she is gaining weight;" I extensively re-iterated previously provided education around weight gain in setting of CHF and had a pointed conversation with her that she is not gaining weight from eating, but from fluid management; patient then stated that she understood this.  Encouraged patient to continue following fluid restrictions and low salt diet, which she agrees to do. ---- patient confirms that she has agreed to continue with home health PT extension, and that home health PT visited with her earlier this week; positive reinforcement provided; patient continues to report "usual aches and pain," "from knees to neck."  States she is going to contact PCP for referral to orthopedic provider.  Patient denies further issues, concerns, or problems today. I confirmed that patient hasmy direct phone number, the main [facility] CM office phone number, and the [facility] CM 24-hour nurse advice phone number should issues arise prior to next scheduled [place] outreach by phone next week post-changes to diuretics.  Encouraged patient to contact me directly if needs, questions, issues, or concerns arise prior to next scheduled outreach; patient agreed to do so.  Plan:  Patient will take all medications as prescribed and will attend all scheduled provider appointments  Patient will actively participate in home health services as ordered post-hospital discharge as long as they are active  Patient will continue monitoring andrecording daily weights, and will contact providers for weight gain > 3lbs overnight/ 5 lbs in one week  Patient will  continue working with [facility] CSW to explore options around ramp installation, possibility of mobile meals  Patient will continue using assistive devices for ambulation  Patient will promptly notify care providers for any new concerns, issues, or problems that arise  [place] to continue with scheduledphone callnext week and routine home visit next month  [facility] CM Care Plan Problem Two     Most Recent Value  Care Plan Problem Two  Knowledge deficit regarding self-health management of chronic disease state of CHF, as evidenced by patient reporting   Role Documenting the Problem Two  Care Management Coordinator  Care Plan for Problem Two  Active  Interventions for Problem Two Long Term Goal   Discussed with patient her recent provider appointments, recent weight gain,  using teach back method, discussed with patient changes made to her diuretics, and encouraged patient to contact care providers promptly for any new concerns, issues, or problems that arise,  extensively discussed with patient potential side effects of high-dose diuretic use and potential signs/ symptoms of high dose diuretic therapy that should be reported to care providers  [facility] Long Term Goal  Over the next 15 days, patient will be able to discuss CHF zones and corresponding action plan for yellow zone, as evidenced by patient reporting during [place] outreach [Goal extended another 15 days on 08/26/17]  [facility] Long Term Goal Start Date  08/26/17  [facility] CM Short Term Goal #1   Over the next 30 days, patient will monitor and record daily weights, as evidenced by patient reporting and review of same during [place] outreach   [facility] CM Short Term Goal #1  Start Date  08/14/17 [Goal extended 08/14/17]  Interventions for Short Term Goal #2   Confirmed that patient has continued monitoring and recording daily weights and reviewed recently recorded weights with patient  [facility] CM Short Term Goal #2   Over the next 30 days, patient  will attend all scheduled provider appointments, as evidenced by patient reporting and review of EMR/ collaboration with providers, during [place] outreach  [facility] CM Short Term Goal #2 Start Date  08/14/17  Interventions for Short Term Goal #2  Reviewed all recent and upcoming provider office visits with patient, and confirmed that patient plans to attend upcoming appointments,  again confirmed that patient does not wish to utilize possible community resources for transportation to provider appointments    [facility] CM Care Plan Problem Three     Most Recent Value  Care Plan Problem Three  Need for [employer] information, as evidenced by patient reporting  Role Documenting the Problem Three  Care Management [place] for Problem Three  Not Active  [facility] Long Term Goal   Over the next 31 days, patient will discuss her community resource needs with [facility] CSW, as evidenced by patient reporting and collaboration with [facility] CSW during [place] outreach  [place] Term Goal Start Date  08/14/17  [facility] Long Term Goal Met Date  08/26/17 - Goal Met  Interventions for Problem Three Long Term Goal  Confirmed through patient and through collaboration with [facility] CSW that [facility] CSW is active in patient's care for evaluation of community resources for meals on wheels and possible placement of ramp at patient's residence     [name], Therapist, sports, BSN, [employer] Coordinator [facility] Care Management  [phone]

## 2017-08-26 NOTE — Telephone Encounter (Signed)
Informed patient that the referral has been placed but with neurology it can take a few weeks. Patient stated understanding

## 2017-08-27 ENCOUNTER — Other Ambulatory Visit: Payer: Self-pay | Admitting: Licensed Clinical Social Worker

## 2017-08-27 ENCOUNTER — Encounter: Payer: Self-pay | Admitting: Internal Medicine

## 2017-08-27 DIAGNOSIS — I130 Hypertensive heart and chronic kidney disease with heart failure and stage 1 through stage 4 chronic kidney disease, or unspecified chronic kidney disease: Secondary | ICD-10-CM | POA: Diagnosis not present

## 2017-08-27 DIAGNOSIS — I872 Venous insufficiency (chronic) (peripheral): Secondary | ICD-10-CM | POA: Diagnosis not present

## 2017-08-27 DIAGNOSIS — N184 Chronic kidney disease, stage 4 (severe): Secondary | ICD-10-CM | POA: Diagnosis not present

## 2017-08-27 DIAGNOSIS — I5030 Unspecified diastolic (congestive) heart failure: Secondary | ICD-10-CM | POA: Diagnosis not present

## 2017-08-27 DIAGNOSIS — G629 Polyneuropathy, unspecified: Secondary | ICD-10-CM | POA: Diagnosis not present

## 2017-08-27 NOTE — Patient Outreach (Signed)
[facility]) Care Management  08/27/2017  [name] [birth year] [id number]  Assessment-CSW completed outreach attempt today to patient and was successful in reaching her. HIPPA verifications provided. Patient reports not receiving a call from [facility] with [employer] yet. [facility] CSW completed call to [employer] and left another voice message inquiring about referral.  Plan-CSW will await return call from [employer] or will complete another call within one week.   Eula Fried, BSW, MSW, [email or web address] Phone: [phone] Fax: [phone]

## 2017-08-27 NOTE — Progress Notes (Signed)
Abstracted and sent to scan  

## 2017-08-28 ENCOUNTER — Other Ambulatory Visit: Payer: Self-pay | Admitting: Licensed Clinical Social Worker

## 2017-08-28 DIAGNOSIS — I5030 Unspecified diastolic (congestive) heart failure: Secondary | ICD-10-CM | POA: Diagnosis not present

## 2017-08-28 DIAGNOSIS — I872 Venous insufficiency (chronic) (peripheral): Secondary | ICD-10-CM | POA: Diagnosis not present

## 2017-08-28 DIAGNOSIS — G629 Polyneuropathy, unspecified: Secondary | ICD-10-CM | POA: Diagnosis not present

## 2017-08-28 DIAGNOSIS — I130 Hypertensive heart and chronic kidney disease with heart failure and stage 1 through stage 4 chronic kidney disease, or unspecified chronic kidney disease: Secondary | ICD-10-CM | POA: Diagnosis not present

## 2017-08-28 DIAGNOSIS — N184 Chronic kidney disease, stage 4 (severe): Secondary | ICD-10-CM | POA: Diagnosis not present

## 2017-08-28 NOTE — Patient Outreach (Addendum)
[place] [facility]) Care Management  08/28/2017  [name] [birth year] [id number]  Assessment-[facility] CSW completed call to [facility] with the [employer] and was able to successfully reach her. [name] reports receiving [facility] CSW's referral and attempted to reach patient but was not successful. [name] shares that she left a voice message and has not heard back yet from patient. [facility] CSW completed call to patient and provided updated and [name]'s direct number again. Patient agreeable to contact her today.  Plan-[facility] CSW will follow up within two weeks.  [name], BSW, MSW, [email or web address] Phone: [phone] Fax: [phone]

## 2017-09-02 ENCOUNTER — Encounter (HOSPITAL_COMMUNITY): Payer: Medicare Other

## 2017-09-02 ENCOUNTER — Telehealth: Payer: Self-pay | Admitting: Internal Medicine

## 2017-09-02 ENCOUNTER — Other Ambulatory Visit: Payer: Self-pay | Admitting: *Deleted

## 2017-09-02 ENCOUNTER — Encounter: Payer: Self-pay | Admitting: *Deleted

## 2017-09-02 ENCOUNTER — Other Ambulatory Visit: Payer: Self-pay | Admitting: Cardiothoracic Surgery

## 2017-09-02 DIAGNOSIS — I7101 Dissection of ascending aorta: Secondary | ICD-10-CM

## 2017-09-02 NOTE — Telephone Encounter (Signed)
Copied from [place] [id number]. Topic: General - Other >> Sep 02, 2017  4:59 PM Valla Leaver wrote: Reason for CRM: [place] with [facility] would like an order to discharge the patient from skilled nursing next week instead of this week because patient declined a routine visit this week.

## 2017-09-02 NOTE — Patient Outreach (Signed)
[place] [facility]) [place] Telephone Outreach  09/02/2017  Chaselyn Nanney 05/16/1945 [id number]  09:25 am:  Unsuccessful telephone outreach toBrenda Oguinn,72 y.o.femalereferred to [place] after recent hospital visit February 14-June 16, 2017 for shortness of breath with hypoxia and pulmonary edema; patient noted to have aortic insufficiency and subacute dissection of aortic root; surgical AVR and repair of type A chronic ascending thoracic aortic dissection was completed on 05/30/17.Patient has history including, but not limited to, CKD; HTN; dCHF; chronic pericardial effusion secondary to CHF, and aortic insufficiency. Patient was discharged home with home health services through [facility] in place for RN/ PT/ OT.Unfortunately, patient experienced hospital re-admission 10 days after hospital discharge, 3/20-25/ 2019 for fever, AKI/ dehydration, and weakness, and was diagnosed with sepsis/ UTI. Patient was discharged home after hospital visitswith home health services in place.   HIPAA compliant voice mail message left for patient, requesting return call back.  11:00 am:  Received return voice mail message from patient, and called her back; successful telephone outreach, HIPAA/ identity verified.  Today, patient reports she is "doing just fine," and she denies concerns/ issues or problems; denies pain today and new/ recent falls.  Patient sounds to be in no obvious/ apparent distress throughout phone call today.  Patient further reports:  -- medications:  Patient is able to verbalize accurately that she is to reduce her recently increased diuretic therapy as of today-- accurately verbalizes that she will re-start lasix 160 mg BID, rather than take TID, as previously changed during recent provider appointment.  Denies other issues/ concerns around medications.  Patient denies today that she experienced any concerning side effects from taking  extra doses of Lasix, including dizziness/ lightheadedness, feelings of faintness.  States she tolerated extra dose "just fine."  -- provider office visits: reports that she is aware of/ plans to attend tomorrow's scheduled office visit with Dr. Prescott Gum, stating husband will provide transportation to this appointment.  Accurately verbalizes that she will go to appointment early for her pre-visit CXR, as ordered.   -- self-health management of chronic disease state of CHF:    ---- reports "legs still  A little bit swollen, but are somewhat better" after recently increased dosing of fluid pills; reports continues keeping legs elevated as much as possible ---- reports continues monitoring and recording daily weights and reports general daily weight ranges between 177-181 lbs; reports weight this morning of 179 lbs ---- continues home O2 at 2 L/min, denies difficulty breathing, increased shortness of breath; continues to admit that she remains short of breath with activity, but states this resolves with rest, "pretty quickly." ---- patient confirms that home health services for PT continue, and also states that home health RN has made a recent visit with her (last week); states that she believes the home health RN will be discharging her soon.  Reports home health 'going fine."  Patient denies further issues, concerns, or problems today. I confirmed that patient hasmy direct phone number, the main [facility] CM office phone number, and the [facility] CM 24-hour nurse advice phone number should issues arise prior to next scheduled [place] outreach with routine home visit next week.  Patient stated that she does not believe that she needs ongoing follow up from [place], and we discussed that patient has thus far made good progress in meeting her previously established [facility] CCM goals. Discussed case closure with patient, and she agreed to discuss further at time of scheduled home  visit next week.  Encouraged  patient to contact me directly if needs, questions, issues, or concerns arise prior to next scheduled outreach; patient agreed to do so.  Plan:  Patient will take all medications as prescribed and will attend all scheduled provider appointments  Patient will actively participate in home health services as ordered post-hospital dischargeas long as they are active  Patient will continue monitoring andrecording daily weights, and will contact providers for weight gain > 3 lbs overnight/ 5 lbs in one week  Patient will continue working with [facility] CSW to explore options around ramp installation, possibility of mobile meals  Patient will continue using assistive devices for ambulation  Patient will promptly notify care providers for any new concerns, issues, or problems that arise  [place] to continue with scheduledhome visit next week, possibly for case closure/ transfer to [place] Problem Two     Most Recent Value  Care Plan Problem Two  Knowledge deficit regarding self-health management of chronic disease state of CHF, as evidenced by patient reporting   Role Documenting the Problem Two  Care Management Coordinator  Care Plan for Problem Two  Active  Interventions for Problem Two Long Term Goal   Discussed with patient her current clinical status,  reminded her to reduce her diuretic therapy back to twice a day today, after recent changes to take three times per day,  confirmed that patient tolerated taking recently increased dose of diuretic and did not experience concerning side effects,  confirmed that patient plans to attend upcoming scheduled office visit with cardiothoracic surgeon, and that husband will provide transportation  [place] Goal  Over the next 15 days, patient will be able to discuss CHF zones and corresponding action plan for yellow zone, as evidenced by patient reporting during [place] outreach  [facility] Long  Term Goal Start Date  08/26/17  [facility] CM Short Term Goal #1   Over the next 30 days, patient will monitor and record daily weights, as evidenced by patient reporting and review of same during [place] outreach   [facility] CM Short Term Goal #1 Start Date  08/14/17  Interventions for Short Term Goal #2   Confirmed that patient continues monitoring and recording daily weights,  reviewed recent weight values with patient  [facility] CM Short Term Goal #2   Over the next 30 days, patient will attend all scheduled provider appointments, as evidenced by patient reporting and review of EMR/ collaboration with providers, during [facility] RN CCM outreach  [facility] CM Short Term Goal #2 Start Date  08/14/17  Interventions for Short Term Goal #2  Confirmed that patient plans to attend all upcoming provider appointments,  reviewed all upcoming appointments with patient and confirmed that hisband will provide transportation     [name], Therapist, sports, BSN, [employer] [facility] Care Management  [phone]

## 2017-09-03 ENCOUNTER — Other Ambulatory Visit: Payer: Self-pay | Admitting: Licensed Clinical Social Worker

## 2017-09-03 ENCOUNTER — Other Ambulatory Visit: Payer: Self-pay

## 2017-09-03 ENCOUNTER — Ambulatory Visit: Payer: Medicare Other | Admitting: Cardiothoracic Surgery

## 2017-09-03 ENCOUNTER — Encounter: Payer: Self-pay | Admitting: Cardiothoracic Surgery

## 2017-09-03 ENCOUNTER — Ambulatory Visit
Admission: RE | Admit: 2017-09-03 | Discharge: 2017-09-03 | Disposition: A | Discharge status: Home / Self Care | Payer: Medicare Other | Source: Ambulatory Visit | Attending: Cardiothoracic Surgery | Admitting: Cardiothoracic Surgery

## 2017-09-03 VITALS — BP 135/75 | HR 97 | Resp 16 | Ht 66.0 in | Wt 171.0 lb

## 2017-09-03 DIAGNOSIS — Z09 Encounter for follow-up examination after completed treatment for conditions other than malignant neoplasm: Secondary | ICD-10-CM | POA: Diagnosis not present

## 2017-09-03 DIAGNOSIS — I7101 Dissection of ascending aorta: Secondary | ICD-10-CM

## 2017-09-03 DIAGNOSIS — Z952 Presence of prosthetic heart valve: Secondary | ICD-10-CM

## 2017-09-03 DIAGNOSIS — J90 Pleural effusion, not elsewhere classified: Secondary | ICD-10-CM | POA: Diagnosis not present

## 2017-09-03 NOTE — Progress Notes (Signed)
PCP is Hoyt Koch, MD Referring Provider is Minus Breeding, MD  Chief Complaint  Patient presents with  . Routine Post Op    1 month f/u with a CXR...RENAL HAS ORDERED POTASSIUM    HPI: Routine scheduled office visit with chest x-ray- almost 3 months postop aortic valve replacement with a biologic prosthesis with combined replacement of the ascending thoracic aorta for a chronic Stanford  type A dissection. Patient has chronic problems with diastolic dysfunction, lower extremity edema, chronic kidney disease stage IV, and poor mobility secondary to arthritis of her knees.  She has maintained a sinus rhythm.  Chest x-ray today shows improved aeration of her lungs with only trace pleural effusions after a more aggressive diuretic regimen.  Patient was reinforced to stay off hydralazine to help improve blood pressure perfusion of kidneys.   Past Medical History:  Diagnosis Date  . Acute pancreatitis 02/04/2017   [name] 02/04/2017  . Adenomatous colon polyp 01/2002  . Anemia, chronic disease    /notes 02/04/2017  . Aortic valve disease    AI/AS  . Arthritis    "lower back, knees" (02/04/2017)  . CHF (congestive heart failure) ([place])   . CKD (chronic kidney disease), stage III ([place])   . Diverticulosis of colon   . DJD (degenerative joint disease)   . Frequent UTI   . Hemorrhoids   . Hypertension   . Lumbar back pain   . Neuropathy   . Obesity   . Pericardial effusion     in a patient with Diastolic heart  failure and Pericardial effusion  known since last 2 D echo in 11/2016 /notes 02/04/2017  . PONV (postoperative nausea and vomiting)   . Pulmonary HTN ([place])   . Renal cyst   . Venous insufficiency   . Vitamin D deficiency     Past Surgical History:  Procedure Laterality Date  . ABDOMINAL HYSTERECTOMY     "partial"  . AORTIC VALVE REPLACEMENT N/A 05/30/2017   Procedure: AORTIC VALVE REPLACEMENT (AVR);  Surgeon: Prescott Gum, Collier Salina, MD;  Location: [facility];  Service:  Vascular;  Laterality: N/A;  . COLONOSCOPY W/ BIOPSIES AND POLYPECTOMY     "bx was ok"  . IR THORACENTESIS ASP PLEURAL SPACE W/IMG GUIDE  06/11/2017  . IR THORACENTESIS ASP PLEURAL SPACE W/IMG GUIDE  06/12/2017  . REPAIR OF ACUTE ASCENDING THORACIC AORTIC DISSECTION N/A 05/30/2017   Procedure: REPLACEMENT OF ASCENDING AORTIC ANEURYSM AND REPAIRED CHRONIC ROOT DISSECTION WITH CIRC ARREST;  Surgeon: Ivin Poot, MD;  Location: [facility];  Service: Vascular;  Laterality: N/A;  . TEE WITHOUT CARDIOVERSION N/A 05/28/2017   Procedure: TRANSESOPHAGEAL ECHOCARDIOGRAM (TEE);  Surgeon: Skeet Latch, MD;  Location: [facility];  Service: Cardiovascular;  Laterality: N/A;  . TEE WITHOUT CARDIOVERSION N/A 05/30/2017   Procedure: TRANSESOPHAGEAL ECHOCARDIOGRAM (TEE);  Surgeon: Prescott Gum, Collier Salina, MD;  Location: [facility];  Service: Open Heart Surgery;  Laterality: N/A;    Family History  Problem Relation Age of Onset  . Diabetes Mother   . Colon polyps Mother   . Hypertension Mother   . Hypertension Brother   . Hypertension Sister   . Colon cancer Neg Hx     Social History Social History   Tobacco Use  . Smoking status: Former Smoker    Packs/day: 0.40    Years: 15.00    Pack years: 6.00    Types: Cigarettes    Last attempt to quit: 04/08/1982    Years since quitting: 35.4  . Smokeless  tobacco: Never Used  Substance Use Topics  . Alcohol use: No  . Drug use: No    Current Outpatient Medications  Medication Sig Dispense Refill  . aspirin EC 325 MG EC tablet Take 1 tablet (325 mg total) by mouth daily. 30 tablet 0  . calcitRIOL (ROCALTROL) 0.25 MCG capsule Take 1 capsule (0.25 mcg total) by mouth every other day. Please take every Monday, Wednesday, Friday 30 capsule 1  . calcium carbonate (TUMS - DOSED IN MG ELEMENTAL CALCIUM) 500 MG chewable tablet Chew 2 tablets by mouth daily as needed for indigestion or heartburn.    . carvedilol (COREG) 25 MG tablet Take 0.5 tablets (12.5 mg total) by  mouth 2 (two) times daily with a meal. 60 tablet 0  . cyclobenzaprine (FLEXERIL) 5 MG tablet Take 1 tablet (5 mg total) by mouth 3 (three) times daily as needed for muscle spasms. 30 tablet 0  . FAMOTIDINE PO Take 1 tablet by mouth 2 (two) times daily as needed (gas).    . fluticasone (FLONASE) 50 MCG/ACT nasal spray Place 2 sprays daily into both nostrils. (Patient taking differently: Place 2 sprays into both nostrils daily as needed for allergies. ) 16 g 2  . furosemide (LASIX) 80 MG tablet Take 2 tablets (160 mg total) by mouth 2 (two) times daily. TAKE TID X1 WEEK THEN BACK TO BID 90 tablet 1  . gabapentin (NEURONTIN) 300 MG capsule TAKE 1 CAPSULE(300 MG) BY MOUTH THREE TIMES DAILY AS NEEDED FOR PAIN 90 capsule 0  . hydrocortisone (ANUSOL-HC) 2.5 % rectal cream Place 1 application rectally as needed. (Patient taking differently: Place 1 application rectally daily as needed for hemorrhoids. ) 30 g 0  . lidocaine (LIDODERM) 5 % Place 1 patch onto the skin daily. Remove & Discard patch within 12 hours or as directed by MD 30 patch 0  . ondansetron (ZOFRAN) 4 MG tablet Take 1 tablet (4 mg total) by mouth every 8 (eight) hours as needed for nausea or vomiting. 20 tablet 0  . pantoprazole (PROTONIX) 40 MG tablet Take 1 tablet (40 mg total) daily by mouth. (Patient taking differently: Take 40 mg by mouth 2 (two) times daily as needed (indigestion). ) 30 tablet 0  . Propylene Glycol (SYSTANE BALANCE) 0.6 % SOLN Place 1 drop into both eyes daily as needed (for dry eyes).    . traMADol (ULTRAM) 50 MG tablet Take 1 tablet (50 mg total) by mouth every 12 (twelve) hours as needed for severe pain. 28 tablet 0  . triamcinolone ointment (KENALOG) 0.5 % Apply 1 application topically daily as needed (itching).     No current facility-administered medications for this visit.     Allergies  Allergen Reactions  . Minoxidil Palpitations and Other (See Comments)    unable to sleep  . Naproxen Swelling    Review  of Systems   Current weight approximately 171 pounds No shortness of breath orthopnea productive cough Some tingling in the fingers of the right hand after right axillary artery cannulation for resection of the ascending aorta and proximal arch  BP 135/75 (BP Location: Left Arm, Patient Position: Sitting, Cuff Size: Normal)   Pulse 97   Resp 16   Ht 5\' 6"  (1.676 m)   Wt 171 lb (77.6 kg)   SpO2 (!) 88% Comment: ON RA...Marland KitchenMarland KitchenUP TP 93% .Marland KitchenSTILL HAS O2 AT HOME...DOESN'T WEAR AL  BMI 27.60 kg/m  Physical Exam Alert and comfortable in wheelchair accompanied with her sister No JVD Lungs clear  Heart rate regular Faint systolic flow murmur through bio- prosthetic valve Chronic lower extremity edema with support hose in place  Diagnostic Tests: Chest x-ray improved.  Trace pleural effusions.  Stable chronic cardiomegaly  Impression: Cardiac status appears to be stable.  She is probably getting an echocardiogram later this summer when she follows up with cardiology.  She still is using her home oxygen especially at night and we will continue this because of her borderline resting O2 saturation 88-90%.  Plan: Importance of heart healthy diet, activity, and adherence to prescribed medications  reinforced with patient.  We also discussed the importance of antibiotic prophylaxis prior to any dental work in the future to avoid endocarditis. I will see her back in 3 months to review her progress.  Len Childs, MD [facility] [phone]

## 2017-09-03 NOTE — Telephone Encounter (Signed)
Verbals given  

## 2017-09-03 NOTE — Patient Outreach (Signed)
[facility]) Care Management  09/03/2017  Suhaila Troiano March 06, 1946 [id number]   Assessment-[facility] CSW completed call to [facility] with [employer] to gain updates on ramp. [facility] CSW unable to reach her successfully but left a voice message requesting a return call with updates.   Plan-CSW will await return call or make another attempt within one-two weeks.  Eula Fried, BSW, MSW, [email or web address] Phone: [phone] Fax: [phone]

## 2017-09-03 NOTE — Telephone Encounter (Signed)
Fine

## 2017-09-04 DIAGNOSIS — I872 Venous insufficiency (chronic) (peripheral): Secondary | ICD-10-CM | POA: Diagnosis not present

## 2017-09-04 DIAGNOSIS — N184 Chronic kidney disease, stage 4 (severe): Secondary | ICD-10-CM | POA: Diagnosis not present

## 2017-09-04 DIAGNOSIS — I5030 Unspecified diastolic (congestive) heart failure: Secondary | ICD-10-CM | POA: Diagnosis not present

## 2017-09-04 DIAGNOSIS — I130 Hypertensive heart and chronic kidney disease with heart failure and stage 1 through stage 4 chronic kidney disease, or unspecified chronic kidney disease: Secondary | ICD-10-CM | POA: Diagnosis not present

## 2017-09-04 DIAGNOSIS — G629 Polyneuropathy, unspecified: Secondary | ICD-10-CM | POA: Diagnosis not present

## 2017-09-05 ENCOUNTER — Telehealth: Payer: Self-pay | Admitting: Emergency Medicine

## 2017-09-05 NOTE — Telephone Encounter (Signed)
Called patient to schedule AWV. Patient declined at this time. 

## 2017-09-09 ENCOUNTER — Other Ambulatory Visit: Payer: Self-pay | Admitting: Licensed Clinical Social Worker

## 2017-09-09 ENCOUNTER — Other Ambulatory Visit: Payer: Self-pay | Admitting: *Deleted

## 2017-09-09 ENCOUNTER — Encounter: Payer: Self-pay | Admitting: *Deleted

## 2017-09-09 NOTE — Patient Outreach (Signed)
[name]) Care Management  [place] CM Routine Home Visit Post-hospital discharge day 71 09/09/2017  [name] September 15, 1945 [id number]  [name] is an 72 y.o. female referred to [place] after recent hospital visit February 14-June 16, 2017 for shortness of breath with hypoxia and pulmonary edema; patient noted to have aortic insufficiency and subacute dissection of aortic root; surgical AVR and repair of type A chronic ascending thoracic aortic dissection was completed on 05/30/17.Patient has history including, but not limited to, CKD; HTN; dCHF; chronic pericardial effusion secondary to CHF, and aortic insufficiency. Patient was discharged home with home health services through [facility] in place for RN/ PT/ OT.Unfortunately, patient experienced hospital re-admission 10 days after hospital discharge, 3/20-25/ 2019 for fever, AKI/ dehydration, and weakness, and was diagnosed with sepsis/ UTI. Patient was discharged home after hospital visitswith home health services in place. HIPAA/ identity verified with patient in person at her home today; patient's husband is intermittently present during visit, but does not actively participate in today's visit; pleasant 40 minute home visit  Subjective: "I think I am doing everything I am supposed to-- which is what I want to be doing, because I want to keep getting better."  Assessment:  Patient appears to continue recuperating fairly well after her recent hospitalizations, despite limitations in her mobility/ ability to progress with home health PT due to her significantly crowded home environment, which has ongoing signs of hoarding. Patient has attended all provider appointments post-hospital discharge and has remained adherent with taking medications as prescribed.  Patient has been working with [facility] CSW on resources available for possible ramp installation and mobile meals.  Patient continues able to verbalize a  good general understanding of her chronic disease state of CHF, but could benefit from ongoing reinforcement of previously provided education around self-health management strategies for same.  Today, patient reports she is "doing pretty good," and she denies concerns/ issues or problems; states she is having "usual aches and pains" today and she denies new/ recent falls. Patient is in no obvious/ apparent distress throughout home visit today.  Patient further reports:  -- medications: Reports has and is taking all medications as prescribed; Patient is able to verbalize accurate report/ understanding  of general purpose, doisng, and scheduling instructions of her medications.  Patient had hydralazine in her bin of medications, but verbalizes that she is NOT taking this medication-- patient was advised to remove this prescription bottle from her medication bin, and this was completed during home visit today.  In-home medication review was completed.  -- provider office visits: reports accurate report of upcoming scheduled provider appointments; recent provider appointments were reviewed with patient today; [facility] calendar was updated to reflect upcoming appointments; reports husband will continue providing transportation to provider appointments  -- self-health management of chronic disease state of CHF: see care plan below  ---- acknowledges that bilateral LE edema persists, but "is better;" legs from knees to feet remain swollen as baseline at +2, however, this appears significantly improved from last [place] home visit; patient reports that she continues trying to keep legs elevated as much as possible at home ---- reports continues monitoring and recording daily weights and reports general daily weight ranges were reviewed with patient today, using her recorded values taken at home:  Ranges have been between 165- 171 lbs since her recently short-term increased diuretic therapy, which has  now been completed; recorded weight today:  165 lbs -- reports that she has been speaking  with her Public house manager" about setting up scales sent to her by [employer]-- states she is expecting follow up call "soon" to set up scales provided by [employer], and declines my offer to assist in contacting insurance company today ---- continues home O2 at 2 L/min, denies difficulty breathing, increased shortness of breath; continues to admit that she remains short of breath with activity, but states this resolves with rest, "quickly." ---- patient confirms that home health services for PT continue, and that she has actively participated in home health services-- Reports recent home health visits "going good."  States that she has been discharged form home health nursing services  Patient denies further issues, concerns, or problems today. I confirmed that patient hasmy direct phone number, the main [facility] CM office phone number, and the [facility] CM 24-hour nurse advice phone number should issues arise prior to next scheduled [place] outreach with scheduled phone call in 2 weeks.  We again discussed that patient has thus far met her previously established goals, and we discussed possibility of [place] case closure with transfer to [place] at the time of next [place] phone call; patient is agreeable.  Encouraged patient to contact me directly if needs, questions, issues, or concerns arise prior to next scheduled outreach; patient agreed to do so.  Objective:    BP (!) 160/88   Pulse 86   Resp 18   Wt 165 lb 12.8 oz (75.2 kg)   SpO2 98%   BMI 26.76 kg/m   Review of Systems  Constitutional: Negative.   Respiratory: Negative for cough, shortness of breath and wheezing.        On O2 at 2 L/min via [place]  Cardiovascular: Positive for leg swelling. Negative for chest pain and palpitations.       Leg swelling persist, but is decreased from last [place] home visit; legs  have +2 edema bilaterally from knees to feet  Gastrointestinal: Negative.  Negative for abdominal pain and nausea.  Genitourinary: Positive for frequency and urgency.       Diuretic therapy  Musculoskeletal: Positive for joint pain and myalgias. Negative for falls.       Chronic myalgias/ neck and knee pain-- unchanged form previous [facility] CCM home visits  Neurological: Negative for dizziness and tingling.  Psychiatric/Behavioral: Negative for depression. The patient is not nervous/anxious.    Physical Exam  Constitutional: She is oriented to person, place, and time. She appears well-developed and well-nourished. No distress.  Cardiovascular: Normal rate, regular rhythm, normal heart sounds and intact distal pulses.  Pulses:      Radial pulses are 2+ on the right side, and 2+ on the left side.  Unable to definitively palpate bilateral DP's secondary to ongoing bilateral LE swelling  Respiratory: Effort normal and breath sounds normal. No respiratory distress. She has no wheezes. She has no rales.  Somewhat decreased respiratory effort- as per previous [facility] CCM home visits; patient unable to take deep breaths during auscultation   GI: Soft. Bowel sounds are normal.  Musculoskeletal: She exhibits edema.  See ROS  Neurological: She is alert and oriented to person, place, and time.  Skin: Skin is warm and dry. No erythema.  Psychiatric: She has a normal mood and affect. Her behavior is normal. Judgment and thought content normal.   Encounter Medications:   Outpatient Encounter Medications as of 09/09/2017  Medication Sig Note  . aspirin EC 325 MG EC tablet Take 1 tablet (325  mg total) by mouth daily.   . calcitRIOL (ROCALTROL) 0.25 MCG capsule Take 1 capsule (0.25 mcg total) by mouth every other day. Please take every Monday, Wednesday, Friday   . calcium carbonate (TUMS - DOSED IN MG ELEMENTAL CALCIUM) 500 MG chewable tablet Chew 2 tablets by mouth daily as needed for indigestion or heartburn.    . cyclobenzaprine (FLEXERIL) 5 MG tablet Take 1 tablet (5 mg total) by mouth 3 (three) times daily as needed for muscle spasms.   Marland Kitchen FAMOTIDINE PO Take 1 tablet by mouth 2 (two) times daily as needed (gas).   . fluticasone (FLONASE) 50 MCG/ACT nasal spray Place 2 sprays daily into both nostrils. (Patient taking differently: Place 2 sprays into both nostrils daily as needed for allergies. ) 08/14/2017: Reports Has not needed recently  . furosemide (LASIX) 80 MG tablet Take 2 tablets (160 mg total) by mouth 2 (two) times daily. TAKE TID X1 WEEK THEN BACK TO BID   . gabapentin (NEURONTIN) 300 MG capsule TAKE 1 CAPSULE(300 MG) BY MOUTH THREE TIMES DAILY AS NEEDED FOR PAIN 07/16/2017: Takes as needed; has not needed recently  . hydrocortisone (ANUSOL-HC) 2.5 % rectal cream Place 1 application rectally as needed. (Patient taking differently: Place 1 application rectally daily as needed for hemorrhoids. ) 08/14/2017: Reports has not needed recently  . ondansetron (ZOFRAN) 4 MG tablet Take 1 tablet (4 mg total) by mouth every 8 (eight) hours as needed for nausea or vomiting. 07/16/2017: Takes only as needed, has not needed recently   . pantoprazole (PROTONIX) 40 MG tablet Take 1 tablet (40 mg total) daily by mouth. (Patient taking differently: Take 40 mg by mouth 2 (two) times daily as needed (indigestion). ) 07/16/2017: Reports takes as needed; reports has not needed recently  . potassium chloride SA (K-DUR,KLOR-CON) 20 MEQ tablet Take 20 mEq by mouth 2 (two) times daily. 09/09/2017: Taking BID x 5 days, then instructed to decrease to QD  . Propylene Glycol (SYSTANE BALANCE) 0.6 % SOLN Place 1 drop into both eyes daily as needed (for dry eyes).   . traMADol (ULTRAM) 50 MG tablet Take 1 tablet (50 mg total) by mouth every 12 (twelve) hours as needed for severe pain. 07/16/2017: Takes only as needed  . triamcinolone ointment (KENALOG) 0.5 % Apply 1 application topically daily as needed (itching). 08/14/2017: Reports has not  needed recently  . carvedilol (COREG) 25 MG tablet Take 0.5 tablets (12.5 mg total) by mouth 2 (two) times daily with a meal. 09/09/2017: Taking as prescribed; to obtain refill this week per patient report  . lidocaine (LIDODERM) 5 % Place 1 patch onto the skin daily. Remove & Discard patch within 12 hours or as directed by MD (Patient not taking: Reported on 09/09/2017)    No facility-administered encounter medications on file as of 09/09/2017.    Functional Status:   In your present state of health, do you have any difficulty performing the following activities: 06/26/2017 06/20/2017  Hearing? N -  Vision? N -  Difficulty concentrating or making decisions? N N  Walking or climbing stairs? Y Y  Dressing or bathing? Y N  Doing errands, shopping? Y Y  Comment - Husband provides Copywriter, advertising and eating ? - Y  Comment - Husband assists with food preparation; no problems eating  Using the Toilet? - N  In the past six months, have you accidently leaked urine? - Y  Do you have problems with loss of bowel control? - N  Managing your Medications? - Y  Comment - Husband assists  Managing your Finances? - Y  Comment - Husband assists  Housekeeping or managing your Housekeeping? - Y  Comment - Husband/ family assists  Some recent data might be hidden   Fall/Depression Screening:    Fall Risk  09/02/2017 08/26/2017 08/14/2017  Falls in the past year? (No Data) (No Data) (No Data)  Comment Denies new/ recent falls Patient denies new/ recent falls  Denies new/ recent falls  Risk for fall due to : - - Impaired mobility;Medication side effect  Risk for fall due to: Comment - - fall prevention risks/ prevention education reiterated   [facility] 2/9 Scores 08/21/2017 06/20/2017 01/22/2017 12/08/2015 06/29/2012  PHQ - 2 Score 0 0 0 0 1   Plan:  Patient will take all medications as prescribed and will attend all scheduled provider appointments  Patient will actively participate in home health  services as ordered post-hospital dischargeas long as they are active  Patient will continue monitoring andrecording daily weights, and will contact providers for weight gain > 3 lbs overnight/ 5 lbs in one week  Patient will continue working withTHN CSW to explore options around ramp installation, possibility of mobile meals  Patient will continue using assistive devices for ambulation  Patient will promptly notify care providers for any new concerns, issues, or problems that arise  [place] to continue with scheduledphone call in 2 weeks, possibly for case closure/ transfer to [place] Problem Two     Most Recent Value  Care Plan Problem Two  Knowledge deficit regarding self-health management of chronic disease state of CHF, as evidenced by patient reporting   Role Documenting the Problem Two  Care Management Coordinator  Care Plan for Problem Two  Active  Interventions for Problem Two Long Term Goal   Confirmed by using teach back method, patient is able to verbalize signs/ symptoms yellow CHF zone with corresponding action plan  [facility]N Long Term Goal  Over the next 15 days, patient will be able to discuss CHF zones and corresponding action plan for yellow zone, as evidenced by patient reporting during [place] outreach  [facility] Long Term Goal Start Date  08/26/17  [facility] Long Term Goal Met Date  09/09/17 - Goal Met  THN CM Short Term Goal #1   Over the next 16 days, patient will monitor and record daily weights, as evidenced by patient reporting and review of same during [place] CCM outreach  [Goal modified/ extended]  [facility] CM Short Term Goal #1 Start Date  09/10/17  Interventions for Short Term Goal #2   Reviewed all recently recorded home weights with patient,  using teach back method, confirmed that patient has continued monitoring and recording weights and is able to accurately verbalize weight gain guidleines in setting of CHF,   discussed value of patient keeping daily weight monitoring a part of her daily routine  TH[facility] CM Short Term Goal #2   Over the next 16 days, patient will attend all scheduled provider appointments, as evidenced by patient reporting and review of EMR/ collaboration with providers, during [facility] RN CCM outreach [Goal modified/ extended]  [facility] CM Short Term Goal #2 Start Date  09/09/17  Interventions for Short Term Goal #2  Reviewed all previous and upcoming provider appointments,  confirmed that patient plans on attending  upcoming provider appointments     [name], RN, BSN, [employer]  Care Coordinator [facility] Care Management  [phone]

## 2017-09-09 NOTE — Patient Outreach (Signed)
[facility]) Care Management  09/09/2017  Zyonna Vardaman [birth year] [id number]  Assessment- [facility] CSW contacted [place] who has a scheduled home visit appointment with patient today. [facility] CSW questioned if she would ask patient if she was ever able to establish contact with Alita Chyle at [employer] as they have been playing "phone tag" for a few weeks since [facility] CSW has made referral. [facility] RNCM agreeable to check on this.  Plan-THN CSW will continue to follow patient and support her in gaining a ramp.  Eula Fried, BSW, MSW, [email or web address] Phone: [phone] Fax: [phone]

## 2017-09-10 DIAGNOSIS — G629 Polyneuropathy, unspecified: Secondary | ICD-10-CM | POA: Diagnosis not present

## 2017-09-10 DIAGNOSIS — I5030 Unspecified diastolic (congestive) heart failure: Secondary | ICD-10-CM | POA: Diagnosis not present

## 2017-09-10 DIAGNOSIS — I130 Hypertensive heart and chronic kidney disease with heart failure and stage 1 through stage 4 chronic kidney disease, or unspecified chronic kidney disease: Secondary | ICD-10-CM | POA: Diagnosis not present

## 2017-09-10 DIAGNOSIS — N184 Chronic kidney disease, stage 4 (severe): Secondary | ICD-10-CM | POA: Diagnosis not present

## 2017-09-10 DIAGNOSIS — I872 Venous insufficiency (chronic) (peripheral): Secondary | ICD-10-CM | POA: Diagnosis not present

## 2017-09-11 DIAGNOSIS — I130 Hypertensive heart and chronic kidney disease with heart failure and stage 1 through stage 4 chronic kidney disease, or unspecified chronic kidney disease: Secondary | ICD-10-CM | POA: Diagnosis not present

## 2017-09-11 DIAGNOSIS — I872 Venous insufficiency (chronic) (peripheral): Secondary | ICD-10-CM | POA: Diagnosis not present

## 2017-09-11 DIAGNOSIS — G629 Polyneuropathy, unspecified: Secondary | ICD-10-CM | POA: Diagnosis not present

## 2017-09-11 DIAGNOSIS — I5030 Unspecified diastolic (congestive) heart failure: Secondary | ICD-10-CM | POA: Diagnosis not present

## 2017-09-11 DIAGNOSIS — N184 Chronic kidney disease, stage 4 (severe): Secondary | ICD-10-CM | POA: Diagnosis not present

## 2017-09-12 ENCOUNTER — Other Ambulatory Visit: Payer: Self-pay | Admitting: Licensed Clinical Social Worker

## 2017-09-12 NOTE — Patient Outreach (Addendum)
[facility]) Care Management  09/12/2017  Story Conti 1945-08-03 [id number]  Assessment- [facility] CSW completed call to [facility] with the [employer] but was unable to reach her successfully. HIPPA complaint voice message left encouraging a return call in order to confirm or deny if contact was able to be established with patient yet.  Plan-THN CSW will await for return call and will follow up with patient within two-three weeks.   Eula Fried, BSW, MSW, [email or web address] Phone: [phone] Fax: [phone]

## 2017-09-17 DIAGNOSIS — N184 Chronic kidney disease, stage 4 (severe): Secondary | ICD-10-CM | POA: Diagnosis not present

## 2017-09-17 DIAGNOSIS — M542 Cervicalgia: Secondary | ICD-10-CM | POA: Diagnosis not present

## 2017-09-17 DIAGNOSIS — I10 Essential (primary) hypertension: Secondary | ICD-10-CM | POA: Diagnosis not present

## 2017-09-17 DIAGNOSIS — D631 Anemia in chronic kidney disease: Secondary | ICD-10-CM | POA: Diagnosis not present

## 2017-09-17 DIAGNOSIS — Z7689 Persons encountering health services in other specified circumstances: Secondary | ICD-10-CM | POA: Diagnosis not present

## 2017-09-18 DIAGNOSIS — G629 Polyneuropathy, unspecified: Secondary | ICD-10-CM | POA: Diagnosis not present

## 2017-09-18 DIAGNOSIS — I5030 Unspecified diastolic (congestive) heart failure: Secondary | ICD-10-CM | POA: Diagnosis not present

## 2017-09-18 DIAGNOSIS — N184 Chronic kidney disease, stage 4 (severe): Secondary | ICD-10-CM | POA: Diagnosis not present

## 2017-09-18 DIAGNOSIS — R0602 Shortness of breath: Secondary | ICD-10-CM | POA: Diagnosis not present

## 2017-09-18 DIAGNOSIS — I130 Hypertensive heart and chronic kidney disease with heart failure and stage 1 through stage 4 chronic kidney disease, or unspecified chronic kidney disease: Secondary | ICD-10-CM | POA: Diagnosis not present

## 2017-09-18 DIAGNOSIS — I872 Venous insufficiency (chronic) (peripheral): Secondary | ICD-10-CM | POA: Diagnosis not present

## 2017-09-19 ENCOUNTER — Other Ambulatory Visit (HOSPITAL_COMMUNITY): Payer: Self-pay | Admitting: *Deleted

## 2017-09-19 ENCOUNTER — Other Ambulatory Visit: Payer: Self-pay | Admitting: Licensed Clinical Social Worker

## 2017-09-19 NOTE — Patient Outreach (Signed)
[facility]) Care Management  09/19/2017  [name] Mar 17, 1946 [id number]  Assessment- [facility] CSW completed outreach call to patient and was able to successfully reach her. HIPPA verifications provided. Patient reports that she did speak with Alita Chyle last week with the [employer] and was informed that she may have to pay out of pocket for some of the expenses in order to gain ramp. Patient did not receive an exact amount on what this would be or look like but [facility] CSW suggested she contact Alita Chyle to investigate this further as patient is not willing to gain ramp if she has to pay out of pocket. Patient reports that she receives paperwork in the mail form [facility] and it mentioned several times about a loan. Patient agreeable to contact [employer] to discuss these concerns. Patient also shares that she has to get copies of bank statements and other personal information which is causing her to reconsider getting the ramp as well. [facility] CSW provided education on the [employer] and their process. Patient receptive to information. Patient denies any further social work needs at this time and will take time to consider if she wants to gain ramp. Patient has until 11/06/17 to send in needed paperwork to [facility] and patient will spend the next couple of months considering if she wishes to do this but at this time she is unwilling. Patient has paperwork and program contact information in a safe place. [facility] CSW will sign off a this time. [facility] CSW is happy to get back involved at anytime if another social work need arises.   Plan-[facility] CSW will update [facility] RNCM and PCP.  Eula Fried, BSW, MSW, [email or web address] Phone: [phone] Fax: [phone]

## 2017-09-22 ENCOUNTER — Other Ambulatory Visit: Payer: Self-pay | Admitting: Cardiothoracic Surgery

## 2017-09-22 ENCOUNTER — Encounter (HOSPITAL_COMMUNITY)
Admission: RE | Admit: 2017-09-22 | Discharge: 2017-09-22 | Disposition: A | Discharge status: Home / Self Care | Payer: Medicare Other | Source: Ambulatory Visit | Attending: Nephrology | Admitting: Nephrology

## 2017-09-22 DIAGNOSIS — D631 Anemia in chronic kidney disease: Secondary | ICD-10-CM | POA: Insufficient documentation

## 2017-09-22 DIAGNOSIS — N189 Chronic kidney disease, unspecified: Secondary | ICD-10-CM | POA: Diagnosis not present

## 2017-09-22 MED ORDER — SODIUM CHLORIDE 0.9 % IV SOLN
510.0000 mg | Freq: Once | INTRAVENOUS | Status: AC
  Administered 2017-09-22: 510 mg via INTRAVENOUS
  Filled 2017-09-22: qty 17

## 2017-09-23 ENCOUNTER — Other Ambulatory Visit: Payer: Self-pay | Admitting: Cardiology

## 2017-09-23 DIAGNOSIS — I129 Hypertensive chronic kidney disease with stage 1 through stage 4 chronic kidney disease, or unspecified chronic kidney disease: Secondary | ICD-10-CM | POA: Diagnosis not present

## 2017-09-23 DIAGNOSIS — D631 Anemia in chronic kidney disease: Secondary | ICD-10-CM | POA: Diagnosis not present

## 2017-09-23 DIAGNOSIS — N184 Chronic kidney disease, stage 4 (severe): Secondary | ICD-10-CM | POA: Diagnosis not present

## 2017-09-23 DIAGNOSIS — N2581 Secondary hyperparathyroidism of renal origin: Secondary | ICD-10-CM | POA: Diagnosis not present

## 2017-09-23 DIAGNOSIS — E8770 Fluid overload, unspecified: Secondary | ICD-10-CM | POA: Diagnosis not present

## 2017-09-24 ENCOUNTER — Encounter: Payer: Self-pay | Admitting: *Deleted

## 2017-09-24 ENCOUNTER — Other Ambulatory Visit: Payer: Self-pay | Admitting: *Deleted

## 2017-09-24 NOTE — Patient Outreach (Signed)
[facility]) Care Management [place] Telephone Outreach- Case Closure Post-last hospital discharge day 85  09/24/2017  Shelva Hetzer [birth year] [id number]  Successful telephone outreach to [place] Staff y.o.femalereferred to [place] after recent hospital visit February 14-June 16, 2017 for shortness of breath with hypoxia and pulmonary edema; patient noted to have aortic insufficiency and subacute dissection of aortic root; surgical AVR and repair of type A chronic ascending thoracic aortic dissection was completed on 05/30/17.Patient has history including, but not limited to, CKD; HTN; dCHF; chronic pericardial effusion secondary to CHF, and aortic insufficiency. Patient was discharged home with home health services through [place] in place for RN/ PT/ OT.Unfortunately, patient experienced hospital re-admission 10 days after hospital discharge, 3/20-25/ 2019 for fever, AKI/ dehydration, and weakness, and was diagnosed with sepsis/ UTI. Patient was discharged home after hospital visitswith home health services in place. HIPAA/ identity verified with patient during phone call today.  Today, patient reports that she "is doing great," and she sounds to be in no apparent distress throughout entirety of phone call.  Patient further reports:  -- home health [facility]) services now completed; "last PT visit last week;" verbalizes active participation in home health services and states she and [facility] PT both agreed that patient was "ready" for discharge  -- "in green zone" for CHF zones-- patient able to verbalize signs/ symptoms CHF zone; reports "using O2 less and tolerating fine;" states continues to use O2 "always at night but only as needed throughout day;" continues monitoring and recording daily weights, reports daily weight ranges consistently 161-163 lbs, with weight today reported as "161 lbs;" able to accurately verbalize weight gain guidelines in setting of  CHF to report to care providers weight gain > 3 lbs overnight/ 5 lbs in one week.  -- has attended all scheduled provider appointments; husband continues to transport, and plans to to attend upcoming scheduled PCP appointment 10/15/17  -- still considering whether she wishes to proceed with resource provided by [facility] CSW for ramp placement; states that she has spoken to both [place] and to [employer], and has both phone numbers should she decide to proceed on ramp placement  Patient denies further issues, concerns, or problems today.  We discussed that patient has thus far successfully met her previously established goals, and I questioned patient if she believed it would be beneficial for [place] CM to continue outreach to her for one more home visit/ phone call post- next scheduled PCP office visit to wrap up, however, patient stated that she believes she "understands and does not need" further outreach from [place]; offered to place referral to [place] for ongoing telephonic follow up, but patient politely declined, stating that she "is doing so well," she does not need; she further states that she typically does not answer her phone when she does not recognize the incoming phone numbers.  I strongly encouraged patient to re-consider, however, she persistently declined; patient stated that she would contact me in the future if she changes her mind.  I confirmed that patient has my direct phone number, the main [facility] CM office phone number, and the [facility] CM 24-hour nurse advice phone number should issues arise in the future.  Plan:  Will close [place] CM case, as patient has successfully met her previously established goals and will make patient's PCP aware of same-- will send PCP case closure letter.  [facility] CM Care Plan Problem Two  Most Recent Value  Care Plan Problem Two  Knowledge deficit regarding self-health management of chronic disease state  of CHF, as evidenced by patient reporting   Role Documenting the Problem Two  Care Management Coordinator  Care Plan for Problem Two  Not Active  [facility] CM Short Term Goal #1   Over the next 16 days, patient will monitor and record daily weights, as evidenced by patient reporting and review of same during [place] outreach    [facility] CM Short Term Goal #1 Start Date  09/10/17  [facility] CM Short Term Goal #1 Met Date   09/24/17 - Goal Met  Interventions for Short Term Goal #2   Confirmed that patient has continued monitoring and recording daily weights,  reviewed recent weights at home with patient,  confirmed that patient continues able to verbalize signs/symptoms CHF yellow zone and that she believes she has been in green zone since last hospital discharge,  reviewed recent and upcoming provider appointments with patient and encouraged patient to allow me to place referral for [place], which patient declined  [facility] CM Short Term Goal #2   Over the next 16 days, patient will attend all scheduled provider appointments, as evidenced by patient reporting and review of EMR/ collaboration with providers, during [facility] RN CCM outreach   [facility] CM Short Term Goal #2 Start Date  09/09/17  [facility] CM Short Term Goal #2 Met Date  09/25/17 - Goal Met  Interventions for Short Term Goal #2  Reviewed recent and upcoming provider appointments with patient who confirms that she has transportation to all appointments and plans to attend as scheduled     It has been a pleasure caring for Martha Sartorius, RN, BSN, [employer] Coordinator [facility] Care Management  [phone]

## 2017-09-26 ENCOUNTER — Telehealth: Payer: Self-pay | Admitting: Cardiology

## 2017-09-26 NOTE — Telephone Encounter (Signed)
Received records from [facility] on 09/26/17, Appt 12/05/17 @ 10:00AM. [place]

## 2017-10-15 ENCOUNTER — Ambulatory Visit: Payer: Medicare Other | Admitting: Internal Medicine

## 2017-10-18 DIAGNOSIS — R0602 Shortness of breath: Secondary | ICD-10-CM | POA: Diagnosis not present

## 2017-10-22 ENCOUNTER — Ambulatory Visit (INDEPENDENT_AMBULATORY_CARE_PROVIDER_SITE_OTHER): Payer: Medicare Other | Admitting: Orthopedic Surgery

## 2017-10-22 ENCOUNTER — Ambulatory Visit (INDEPENDENT_AMBULATORY_CARE_PROVIDER_SITE_OTHER): Payer: Self-pay

## 2017-10-22 ENCOUNTER — Encounter (INDEPENDENT_AMBULATORY_CARE_PROVIDER_SITE_OTHER): Payer: Self-pay | Admitting: Orthopedic Surgery

## 2017-10-22 DIAGNOSIS — M25561 Pain in right knee: Principal | ICD-10-CM

## 2017-10-22 DIAGNOSIS — M25562 Pain in left knee: Principal | ICD-10-CM

## 2017-10-22 DIAGNOSIS — G8929 Other chronic pain: Secondary | ICD-10-CM | POA: Diagnosis not present

## 2017-10-26 ENCOUNTER — Encounter (INDEPENDENT_AMBULATORY_CARE_PROVIDER_SITE_OTHER): Payer: Self-pay | Admitting: Orthopedic Surgery

## 2017-10-26 DIAGNOSIS — G8929 Other chronic pain: Secondary | ICD-10-CM

## 2017-10-26 DIAGNOSIS — M25561 Pain in right knee: Secondary | ICD-10-CM | POA: Diagnosis not present

## 2017-10-26 DIAGNOSIS — M25562 Pain in left knee: Secondary | ICD-10-CM

## 2017-10-26 MED ORDER — METHYLPREDNISOLONE ACETATE 40 MG/ML IJ SUSP
40.0000 mg | INTRAMUSCULAR | Status: AC | PRN
  Administered 2017-10-26: 40 mg via INTRA_ARTICULAR

## 2017-10-26 MED ORDER — LIDOCAINE HCL 1 % IJ SOLN
5.0000 mL | INTRAMUSCULAR | Status: AC | PRN
  Administered 2017-10-26: 5 mL

## 2017-10-26 MED ORDER — BUPIVACAINE HCL 0.25 % IJ SOLN
4.0000 mL | INTRAMUSCULAR | Status: AC | PRN
  Administered 2017-10-26: 4 mL via INTRA_ARTICULAR

## 2017-10-26 NOTE — Progress Notes (Signed)
Office Visit Note   Patient: [name]           Date of Birth: 1945-11-02           MRN: [id number] Visit Date: 10/22/2017 Requested by: Hoyt Koch, MD [place], [place] [id number] PCP: Hoyt Koch, MD  Subjective: Chief Complaint  Patient presents with  . Knee Pain    HPI: Ruby Cola is a patient with bilateral knee pain left worse than right.'s been worse over the last few months.  She had open heart surgery in February along with valve replacement.  She reports decreased activity.  She is unable to do prolonged activity with her knees.  She has kidney issues also.  Weight diminished from 234 down to 160.  She has had previous injections in the knees which have helped.              ROS: All systems reviewed are negative as they relate to the chief complaint within the history of present illness.  Patient denies  fevers or chills.   Assessment & Plan: Visit Diagnoses:  1. Chronic pain of both knees     Plan: Impression is symptomatic bilateral knee arthritis in a patient with multiple other medical comorbidities with open heart surgery this year.  Plan is injection into the knees.  May need knee replacement sometime in the future but for now to work on her recovery and hopefully this will give her some temporary relief.  I will see her back as needed  Follow-Up Instructions: Return if symptoms worsen or fail to improve.   Orders:  Orders Placed This Encounter  Procedures  . XR Knee 1-2 Views Right  . XR Knee 1-2 Views Left   No orders of the defined types were placed in this encounter.     Procedures: Large Joint Inj: bilateral knee on 10/26/2017 10:03 AM Indications: diagnostic evaluation, joint swelling and pain Details: 18 G 1.5 in needle, superolateral approach  Arthrogram: No  Medications (Right): 5 mL lidocaine 1 %; 4 mL bupivacaine 0.25 %; 40 mg methylPREDNISolone acetate 40 MG/ML Medications (Left): 5 mL lidocaine 1 %; 4 mL  bupivacaine 0.25 %; 40 mg methylPREDNISolone acetate 40 MG/ML Outcome: tolerated well, no immediate complications Procedure, treatment alternatives, risks and benefits explained, specific risks discussed. Consent was given by the patient. Immediately prior to procedure a time out was called to verify the correct patient, procedure, equipment, support staff and site/side marked as required. Patient was prepped and draped in the usual sterile fashion.       Clinical Data: No additional findings.  Objective: Vital Signs: There were no vitals taken for this visit.  Physical Exam:   Constitutional: Patient appears well-developed HEENT:  Head: Normocephalic Eyes:EOM are normal Neck: Normal range of motion Cardiovascular: Normal rate Pulmonary/chest: Effort normal Neurologic: Patient is alert Skin: Skin is warm Psychiatric: Patient has normal mood and affect    Ortho Exam: Ortho exam demonstrates good range of motion bilateral knees with intact extensor mechanism.  She has about a 5 to 10 degree flexion contracture in both knees with flexion to about 90.  Collateral and cruciate ligaments are stable.  Pedal pulses palpable.  No groin pain with internal/external rotation of the leg.  Specialty Comments:  No specialty comments available.  Imaging: No results found.   PMFS History: Patient Active Problem List   Diagnosis Date Noted  . Macrocytic anemia 06/26/2017  . Bilateral leg weakness 06/26/2017  . Cough  06/26/2017  . S/P AVR 05/30/2017  . Ascending aortic dissection ([place])   . Chronic diastolic CHF (congestive heart failure) ([place]) 04/19/2017  . Nonrheumatic aortic valve insufficiency 03/10/2017  . Pulmonary HTN ([place]) 03/10/2017  . Chronic midline posterior neck pain 02/11/2017  . Lobar pneumonia ([place])   . Acute respiratory failure with hypoxia ([place])   . Pericardial effusion 02/04/2017  . Abnormal bone marrow examination 01/24/2017  . Stenosis of right carotid artery  08/27/2016  . LBBB (left bundle branch block) 08/27/2016  . Left-sided tinnitus 08/16/2016  . Anemia in chronic kidney disease 11/02/2015  . Acute renal failure superimposed on stage 4 chronic kidney disease ([place]) 04/26/2014  . Complicated UTI (urinary tract infection) 04/26/2014  . Diastolic congestive heart failure ([place]) 04/26/2014  . Knee pain, bilateral 03/23/2012  . Vitamin D deficiency 04/13/2008  . OBESITY 02/23/2008  . Radiculopathy 02/23/2008  . Essential hypertension 02/23/2008  . Venous (peripheral) insufficiency 02/23/2008   Past Medical History:  Diagnosis Date  . Acute pancreatitis 02/04/2017   [name] 02/04/2017  . Adenomatous colon polyp 01/2002  . Anemia, chronic disease    /notes 02/04/2017  . Aortic valve disease    AI/AS  . Arthritis    "lower back, knees" (02/04/2017)  . CHF (congestive heart failure) ([place])   . CKD (chronic kidney disease), stage III ([place])   . Diverticulosis of colon   . DJD (degenerative joint disease)   . Frequent UTI   . Hemorrhoids   . Hypertension   . Lumbar back pain   . Neuropathy   . Obesity   . Pericardial effusion     in a patient with Diastolic heart  failure and Pericardial effusion  known since last 2 D echo in 11/2016 /notes 02/04/2017  . PONV (postoperative nausea and vomiting)   . Pulmonary HTN ([place])   . Renal cyst   . Venous insufficiency   . Vitamin D deficiency     Family History  Problem Relation Age of Onset  . Diabetes Mother   . Colon polyps Mother   . Hypertension Mother   . Hypertension Brother   . Hypertension Sister   . Colon cancer Neg Hx     Past Surgical History:  Procedure Laterality Date  . ABDOMINAL HYSTERECTOMY     "partial"  . AORTIC VALVE REPLACEMENT N/A 05/30/2017   Procedure: AORTIC VALVE REPLACEMENT (AVR);  Surgeon: Prescott Gum, Collier Salina, MD;  Location: [facility];  Service: Vascular;  Laterality: N/A;  . COLONOSCOPY W/ BIOPSIES AND POLYPECTOMY     "bx was ok"  . IR THORACENTESIS ASP PLEURAL  SPACE W/IMG GUIDE  06/11/2017  . IR THORACENTESIS ASP PLEURAL SPACE W/IMG GUIDE  06/12/2017  . REPAIR OF ACUTE ASCENDING THORACIC AORTIC DISSECTION N/A 05/30/2017   Procedure: REPLACEMENT OF ASCENDING AORTIC ANEURYSM AND REPAIRED CHRONIC ROOT DISSECTION WITH CIRC ARREST;  Surgeon: Ivin Poot, MD;  Location: [facility];  Service: Vascular;  Laterality: N/A;  . TEE WITHOUT CARDIOVERSION N/A 05/28/2017   Procedure: TRANSESOPHAGEAL ECHOCARDIOGRAM (TEE);  Surgeon: Skeet Latch, MD;  Location: [facility];  Service: Cardiovascular;  Laterality: N/A;  . TEE WITHOUT CARDIOVERSION N/A 05/30/2017   Procedure: TRANSESOPHAGEAL ECHOCARDIOGRAM (TEE);  Surgeon: Prescott Gum, Collier Salina, MD;  Location: [facility];  Service: Open Heart Surgery;  Laterality: N/A;   Social History   Occupational History  . Occupation: retired from [employer], [employer]  Tobacco Use  . Smoking status: Former Smoker    Packs/day: 0.40    Years: 15.00  Pack years: 6.00    Types: Cigarettes    Last attempt to quit: 04/08/1982    Years since quitting: 35.5  . Smokeless tobacco: Never Used  Substance and Sexual Activity  . Alcohol use: No  . Drug use: No  . Sexual activity: Never

## 2017-11-05 DIAGNOSIS — I129 Hypertensive chronic kidney disease with stage 1 through stage 4 chronic kidney disease, or unspecified chronic kidney disease: Secondary | ICD-10-CM | POA: Diagnosis not present

## 2017-11-05 DIAGNOSIS — N184 Chronic kidney disease, stage 4 (severe): Secondary | ICD-10-CM | POA: Diagnosis not present

## 2017-11-05 DIAGNOSIS — N2581 Secondary hyperparathyroidism of renal origin: Secondary | ICD-10-CM | POA: Diagnosis not present

## 2017-11-05 DIAGNOSIS — D631 Anemia in chronic kidney disease: Secondary | ICD-10-CM | POA: Diagnosis not present

## 2017-11-05 DIAGNOSIS — I7100 Dissection of unspecified site of aorta: Secondary | ICD-10-CM | POA: Diagnosis not present

## 2017-11-11 ENCOUNTER — Telehealth: Payer: Self-pay | Admitting: Cardiology

## 2017-11-11 NOTE — Telephone Encounter (Signed)
Received records from [place] on 11/11/17, Appt 11/24/17 @ 10:40AM. [place]

## 2017-11-18 DIAGNOSIS — R0602 Shortness of breath: Secondary | ICD-10-CM | POA: Diagnosis not present

## 2017-11-23 NOTE — Progress Notes (Signed)
Cardiology Office Note   Date:  11/24/2017   ID:  [name], DOB 12-Dec-1945, MRN [id number]  PCP:  Hoyt Koch, MD  Cardiologist:   Minus Breeding, MD   Chief Complaint  Patient presents with  . Edema      History of Present Illness: [name] is a 72 y.o. female who presents for follow up of repaired aortic dissection.  She is status post bioprosthetic AVR and replacement of the ascending thoracic aorta.  She has stage IV CKD and has been managed by Dr. Jimmy Footman for chronic diastolic HF with pleural effusions and edema.      Since she was last seen she lost about 20 pounds.  She is had excellent diuresis.  I reviewed her most recent renal notes.  She has been carefully followed.  She was hypokalemic and is on replacement.  However, her creatinine has been stable at 2.36.  Her lower extremity swelling is much improved compared to previous.  She actually feels good.  She has knee problems and she gets around with a walker and for long distances in a wheelchair.  There is been that some discussion about her blood pressure and Dr. Jimmy Footman does not want to get too low.  Her readings at home are 130/80 or so.  She says it is well controlled.  She is watching her salt.  She is taking her medications as prescribed.  Her breathing is improved.  She overall feels better.  She is not describing PND or orthopnea.  She has not been having any chest pressure, neck or arm discomfort.    Past Medical History:  Diagnosis Date  . Acute pancreatitis 02/04/2017   [name] 02/04/2017  . Adenomatous colon polyp 01/2002  . Anemia, chronic disease    /notes 02/04/2017  . Aortic valve disease    AI/AS  . Arthritis    "lower back, knees" (02/04/2017)  . CHF (congestive heart failure) ([place])   . CKD (chronic kidney disease), stage III ([place])   . Diverticulosis of colon   . DJD (degenerative joint disease)   . Frequent UTI   . Hemorrhoids   . Hypertension   . Lumbar back pain   .  Neuropathy   . Obesity   . Pericardial effusion     in a patient with Diastolic heart  failure and Pericardial effusion  known since last 2 D echo in 11/2016 /notes 02/04/2017  . PONV (postoperative nausea and vomiting)   . Pulmonary HTN ([place])   . Renal cyst   . Venous insufficiency   . Vitamin D deficiency     Past Surgical History:  Procedure Laterality Date  . ABDOMINAL HYSTERECTOMY     "partial"  . AORTIC VALVE REPLACEMENT N/A 05/30/2017   Procedure: AORTIC VALVE REPLACEMENT (AVR);  Surgeon: Prescott Gum, Collier Salina, MD;  Location: [facility];  Service: Vascular;  Laterality: N/A;  . COLONOSCOPY W/ BIOPSIES AND POLYPECTOMY     "bx was ok"  . IR THORACENTESIS ASP PLEURAL SPACE W/IMG GUIDE  06/11/2017  . IR THORACENTESIS ASP PLEURAL SPACE W/IMG GUIDE  06/12/2017  . REPAIR OF ACUTE ASCENDING THORACIC AORTIC DISSECTION N/A 05/30/2017   Procedure: REPLACEMENT OF ASCENDING AORTIC ANEURYSM AND REPAIRED CHRONIC ROOT DISSECTION WITH CIRC ARREST;  Surgeon: Ivin Poot, MD;  Location: [facility];  Service: Vascular;  Laterality: N/A;  . TEE WITHOUT CARDIOVERSION N/A 05/28/2017   Procedure: TRANSESOPHAGEAL ECHOCARDIOGRAM (TEE);  Surgeon: Skeet Latch, MD;  Location: [facility] [facility];  Service: Cardiovascular;  Laterality: N/A;  . TEE WITHOUT CARDIOVERSION N/A 05/30/2017   Procedure: TRANSESOPHAGEAL ECHOCARDIOGRAM (TEE);  Surgeon: Prescott Gum, Collier Salina, MD;  Location: [facility];  Service: Open Heart Surgery;  Laterality: N/A;     Current Outpatient Medications  Medication Sig Dispense Refill  . aspirin EC 81 MG tablet Take 81 mg by mouth daily.    . calcitRIOL (ROCALTROL) 0.25 MCG capsule Take 1 capsule (0.25 mcg total) by mouth every other day. Please take every Monday, Wednesday, Friday 30 capsule 1  . calcium carbonate (TUMS - DOSED IN MG ELEMENTAL CALCIUM) 500 MG chewable tablet Chew 2 tablets by mouth daily as needed for indigestion or heartburn.    . carvedilol (COREG) 25 MG tablet TAKE 1/2 TABLET(12.5 MG) BY  MOUTH TWICE DAILY WITH A MEAL 30 tablet 10  . cyclobenzaprine (FLEXERIL) 5 MG tablet Take 1 tablet (5 mg total) by mouth 3 (three) times daily as needed for muscle spasms. 30 tablet 0  . FAMOTIDINE PO Take 1 tablet by mouth 2 (two) times daily as needed (gas).    . fluticasone (FLONASE) 50 MCG/ACT nasal spray Place 2 sprays daily into both nostrils. (Patient taking differently: Place 2 sprays into both nostrils daily as needed for allergies. ) 16 g 2  . furosemide (LASIX) 80 MG tablet Take 160 mg by mouth 2 (two) times daily.    Marland Kitchen gabapentin (NEURONTIN) 300 MG capsule TAKE 1 CAPSULE(300 MG) BY MOUTH THREE TIMES DAILY AS NEEDED FOR PAIN 90 capsule 0  . hydrocortisone (ANUSOL-HC) 2.5 % rectal cream Place 1 application rectally as needed. 30 g 0  . ondansetron (ZOFRAN) 4 MG tablet Take 1 tablet (4 mg total) by mouth every 8 (eight) hours as needed for nausea or vomiting. 20 tablet 0  . pantoprazole (PROTONIX) 40 MG tablet Take 1 tablet (40 mg total) daily by mouth. (Patient taking differently: Take 40 mg by mouth 2 (two) times daily as needed (indigestion). ) 30 tablet 0  . potassium chloride SA (K-DUR,KLOR-CON) 20 MEQ tablet Take 20 mEq by mouth 2 (two) times daily.    Marland Kitchen Propylene Glycol (SYSTANE BALANCE) 0.6 % SOLN Place 1 drop into both eyes daily as needed (for dry eyes).    . traMADol (ULTRAM) 50 MG tablet Take 1 tablet (50 mg total) by mouth every 12 (twelve) hours as needed for severe pain. 28 tablet 0  . triamcinolone ointment (KENALOG) 0.5 % Apply 1 application topically daily as needed (itching).    Marland Kitchen lidocaine (LIDODERM) 5 % Place 1 patch onto the skin daily. Remove & Discard patch within 12 hours or as directed by MD (Patient not taking: Reported on 09/09/2017) 30 patch 0   No current facility-administered medications for this visit.     Allergies:   Minoxidil and Naproxen    ROS:  Please see the history of present illness.   Otherwise, review of systems are positive for none.   All other  systems are reviewed and negative.    PHYSICAL EXAM: VS:  BP (!) 142/82   Pulse 85   Ht 5\' 6"  (1.676 m)   Wt 160 lb (72.6 kg)   BMI 25.82 kg/m  , BMI Body mass index is 25.82 kg/m. GEN:  No distress, frail  NECK:  No jugular venous distention at 90 degrees, waveform within normal limits, carotid upstroke brisk and symmetric, no bruits, no thyromegaly LYMPHATICS:  No cervical adenopathy LUNGS:  Clear to auscultation bilaterally BACK:  No CVA tenderness CHEST:  Unremarkable HEART:  S1 and S2 within normal limits, no S3, no S4, no clicks, no rubs, 2 out of 6 apical harsh systolic murmur nonradiating, no diastolic murmurs ABD:  Positive bowel sounds normal in frequency in pitch, no bruits, no rebound, no guarding, unable to assess midline mass or bruit with the patient seated. EXT:  2 plus pulses throughout, trace ankle edema, no cyanosis no clubbing SKIN:  No rashes no nodules   EKG:  EKG is ordered today. The ekg ordered today demonstrates sinus rhythm, left bundle branch block, deviation, no change from previous.   Recent Labs: 11/27/2016: Pro B Natriuretic peptide (BNP) 202.0 05/29/2017: TSH 0.703 05/31/2017: Magnesium 2.2 06/26/2017: B Natriuretic Peptide 802.3 08/19/2017: ALT 9; BUN 53; Creatinine 2.5; Hemoglobin 9.0; Platelets 255; Potassium 3.2; Sodium 142    Lipid Panel    Component Value Date/Time   CHOL 141 02/04/2017 2101   TRIG 78 02/04/2017 2101   HDL 49 02/04/2017 2101   CHOLHDL 2.9 02/04/2017 2101   VLDL 16 02/04/2017 2101   LDLCALC 76 02/04/2017 2101      Wt Readings from Last 3 Encounters:  11/24/17 160 lb (72.6 kg)  09/09/17 165 lb 12.8 oz (75.2 kg)  09/03/17 171 lb (77.6 kg)      Other studies Reviewed: Additional studies/ records that were reviewed today include: Renal notes, CVVS notes. Review of the above records demonstrates:  Please see elsewhere in the note.     ASSESSMENT AND PLAN:   Hypertension:   Her blood pressures currently well  controlled.  We are avoiding hypotension wanting good control given her dissection.  I think this is being achieved.  Hypokalemia: She has been on replacement since having a potassium of 3.2 recently.  Nephrology has ordered follow-up basic metabolic profile.  Chronic lower extremity edema: This is much improved.  She will continue the meds as listed.  Chronic diastolic heart failure:   Her volume status seems to be improved.  She can continue the meds as listed with follow-up labs as above.  AVR:   She will be seeing Dr. Prescott Gum soon.  By exam she seems to have a stable repair.  I suggested she could go to an 81 mg aspirin.  CKD Stage IV:    This is being followed by Dr. Micki Riley DISSECTION STATUS POST REPAIR:  She has follow up later this month with Dr. Prescott Gum and she understands SBE prophylaxis.     Current medicines are reviewed at length with the patient today.  The patient does not have concerns regarding medicines.  The following changes have been made:  no change  Labs/ tests ordered today include: None  Orders Placed This Encounter  Procedures  . EKG 12-Lead     Disposition:   FU with me in six months.     Signed, Minus Breeding, MD  11/24/2017 1:01 PM    [facility]

## 2017-11-24 ENCOUNTER — Ambulatory Visit (INDEPENDENT_AMBULATORY_CARE_PROVIDER_SITE_OTHER): Payer: Medicare Other | Admitting: Cardiology

## 2017-11-24 ENCOUNTER — Encounter: Payer: Self-pay | Admitting: Cardiology

## 2017-11-24 VITALS — BP 142/82 | HR 85 | Ht 66.0 in | Wt 160.0 lb

## 2017-11-24 DIAGNOSIS — I7101 Dissection of ascending aorta: Secondary | ICD-10-CM

## 2017-11-24 DIAGNOSIS — I5032 Chronic diastolic (congestive) heart failure: Secondary | ICD-10-CM | POA: Diagnosis not present

## 2017-11-24 DIAGNOSIS — M7989 Other specified soft tissue disorders: Secondary | ICD-10-CM | POA: Diagnosis not present

## 2017-11-24 DIAGNOSIS — I447 Left bundle-branch block, unspecified: Secondary | ICD-10-CM

## 2017-11-24 DIAGNOSIS — I10 Essential (primary) hypertension: Secondary | ICD-10-CM | POA: Diagnosis not present

## 2017-11-24 NOTE — Patient Instructions (Signed)
Medication Instructions:  DECREASE- Aspirin 81 mg daily  If you need a refill on your cardiac medications before your next appointment, please call your pharmacy.  Labwork: None Ordered   Testing/Procedures: None Ordered   Follow-Up: Your physician wants you to follow-up in: 6 Months. You should receive a reminder letter in the mail two months in advance. If you do not receive a letter, please call our office in [phone] to schedule your follow-up appointment.   Thank you for choosing [facility]!!

## 2017-12-03 ENCOUNTER — Encounter: Payer: Medicare Other | Admitting: Cardiothoracic Surgery

## 2017-12-04 ENCOUNTER — Encounter: Payer: Self-pay | Admitting: Family Medicine

## 2017-12-04 ENCOUNTER — Ambulatory Visit (INDEPENDENT_AMBULATORY_CARE_PROVIDER_SITE_OTHER): Payer: Medicare Other | Admitting: Family Medicine

## 2017-12-04 DIAGNOSIS — G8929 Other chronic pain: Secondary | ICD-10-CM

## 2017-12-04 DIAGNOSIS — M542 Cervicalgia: Secondary | ICD-10-CM

## 2017-12-04 DIAGNOSIS — N184 Chronic kidney disease, stage 4 (severe): Secondary | ICD-10-CM | POA: Diagnosis not present

## 2017-12-04 DIAGNOSIS — D631 Anemia in chronic kidney disease: Secondary | ICD-10-CM | POA: Diagnosis not present

## 2017-12-04 DIAGNOSIS — R319 Hematuria, unspecified: Secondary | ICD-10-CM | POA: Diagnosis not present

## 2017-12-04 NOTE — Patient Instructions (Signed)
Nice to meet you  Please try the exercises  Please try a muscle rub on the neck such as capsaicin cream or Aspercreme.

## 2017-12-04 NOTE — Assessment & Plan Note (Signed)
Acute on chronic neck pain. MRI was reassuring.  - trigger point injections.  - counseled on HEP  - counseled on supportive care  - if no improvement consider PT.

## 2017-12-04 NOTE — Progress Notes (Signed)
[name] - 72 y.o. female MRN [id number]  Date of birth: 06-26-1945  SUBJECTIVE:  Including CC & ROS.  Chief Complaint  Patient presents with  . Neck Pain    [name] is a 72 y.o. female that is here today with neck pain. Ongoing for one week. Located on the left lateral aspect of her neck. She has experienced these symptoms before. She has been using a heating pad. She has not been taking anything for the neck pain. Admits to limited ROM.Denies tingling or numbness. Denies cervical radiculopathy.  Review of the MRI cervical spine from 3/21 shows normal appearance of the cervical spinal cord.  Shows degenerative disc osteophyte at C5-6.  Shows moderate canal and severe bilateral foraminal stenosis at C6.  Cervical spondylosis and facet hypertrophy without significant spinal stenosis.   Review of Systems  Constitutional: Negative for fever.  HENT: Negative for congestion.   Respiratory: Negative for cough.   Cardiovascular: Negative for chest pain.  Gastrointestinal: Negative for abdominal pain.  Musculoskeletal: Positive for arthralgias, back pain and gait problem.  Skin: Negative for color change.  Neurological: Negative for weakness.  Hematological: Negative for adenopathy.  Psychiatric/Behavioral: Negative for agitation.    HISTORY: Past Medical, Surgical, Social, and Family History Reviewed & Updated per EMR.   Pertinent Historical Findings include:  Past Medical History:  Diagnosis Date  . Acute pancreatitis 02/04/2017   [name] 02/04/2017  . Adenomatous colon polyp 01/2002  . Anemia, chronic disease    /notes 02/04/2017  . Aortic valve disease    AI/AS  . Arthritis    "lower back, knees" (02/04/2017)  . CHF (congestive heart failure) ([place])   . CKD (chronic kidney disease), stage III ([place])   . Diverticulosis of colon   . DJD (degenerative joint disease)   . Frequent UTI   . Hemorrhoids   . Hypertension   . Lumbar back pain   . Neuropathy   . Obesity   .  Pericardial effusion     in a patient with Diastolic heart  failure and Pericardial effusion  known since last 2 D echo in 11/2016 /notes 02/04/2017  . PONV (postoperative nausea and vomiting)   . Pulmonary HTN ([place])   . Renal cyst   . Venous insufficiency   . Vitamin D deficiency     Past Surgical History:  Procedure Laterality Date  . ABDOMINAL HYSTERECTOMY     "partial"  . AORTIC VALVE REPLACEMENT N/A 05/30/2017   Procedure: AORTIC VALVE REPLACEMENT (AVR);  Surgeon: Prescott Gum, Collier Salina, MD;  Location: [place];  Service: Vascular;  Laterality: N/A;  . COLONOSCOPY W/ BIOPSIES AND POLYPECTOMY     "bx was ok"  . IR THORACENTESIS ASP PLEURAL SPACE W/IMG GUIDE  06/11/2017  . IR THORACENTESIS ASP PLEURAL SPACE W/IMG GUIDE  06/12/2017  . REPAIR OF ACUTE ASCENDING THORACIC AORTIC DISSECTION N/A 05/30/2017   Procedure: REPLACEMENT OF ASCENDING AORTIC ANEURYSM AND REPAIRED CHRONIC ROOT DISSECTION WITH CIRC ARREST;  Surgeon: Ivin Poot, MD;  Location: [place];  Service: Vascular;  Laterality: N/A;  . TEE WITHOUT CARDIOVERSION N/A 05/28/2017   Procedure: TRANSESOPHAGEAL ECHOCARDIOGRAM (TEE);  Surgeon: Skeet Latch, MD;  Location: [place];  Service: Cardiovascular;  Laterality: N/A;  . TEE WITHOUT CARDIOVERSION N/A 05/30/2017   Procedure: TRANSESOPHAGEAL ECHOCARDIOGRAM (TEE);  Surgeon: Prescott Gum, Collier Salina, MD;  Location: [place];  Service: Open Heart Surgery;  Laterality: N/A;    Allergies  Allergen Reactions  . Minoxidil Palpitations and Other (See Comments)  unable to sleep  . Naproxen Swelling    Family History  Problem Relation Age of Onset  . Diabetes Mother   . Colon polyps Mother   . Hypertension Mother   . Hypertension Brother   . Hypertension Sister   . Colon cancer Neg Hx      Social History   Socioeconomic History  . Marital status: Married    Spouse name: Juanda Crumble  . Number of children: 1  . Years of education: 75  . Highest education level: Not on file  Occupational  History  . Occupation: retired from [employer], arts  Social Needs  . Financial resource strain: Not on file  . Food insecurity:    Worry: Not on file    Inability: Not on file  . Transportation needs:    Medical: Not on file    Non-medical: Not on file  Tobacco Use  . Smoking status: Former Smoker    Packs/day: 0.40    Years: 15.00    Pack years: 6.00    Types: Cigarettes    Last attempt to quit: 04/08/1982    Years since quitting: 35.6  . Smokeless tobacco: Never Used  Substance and Sexual Activity  . Alcohol use: No  . Drug use: No  . Sexual activity: Never  Lifestyle  . Physical activity:    Days per week: Not on file    Minutes per session: Not on file  . Stress: Not on file  Relationships  . Social connections:    Talks on phone: Not on file    Gets together: Not on file    Attends religious service: Not on file    Active member of club or organization: Not on file    Attends meetings of clubs or organizations: Not on file    Relationship status: Not on file  . Intimate partner violence:    Fear of current or ex partner: Not on file    Emotionally abused: Not on file    Physically abused: Not on file    Forced sexual activity: Not on file  Other Topics Concern  . Not on file  Social History Narrative   Lives at home w/ her husband   Right-handed   Daily caffeine      PHYSICAL EXAM:  VS: BP 126/86 (BP Location: Left Arm, Patient Position: Sitting, Cuff Size: Normal)   Pulse 86   Ht 5\' 6"  (1.676 m)   SpO2 98%   BMI 25.82 kg/m  Physical Exam Gen: NAD, alert, cooperative with exam, well-appearing ENT: normal lips, normal nasal mucosa,  Eye: normal EOM, normal conjunctiva and lids CV:  no edema, +2 pedal pulses   Resp: no accessory muscle use, non-labored,   Skin: no rashes, no areas of induration  Neuro: normal tone, normal sensation to touch Psych:  normal insight, alert and oriented MSK:  Neck:  Holding neck in a down and in position.  Limited  extension  Limited lateral rotation to the left and right  TTP of the paraspinal cervical muscles on the left.  Neurovascularly intact    Aspiration/Injection Procedure Note [name] Aug 19, 1945  Procedure: Injection Indications: neck pain   Procedure Details Consent: Risks of procedure as well as the alternatives and risks of each were explained to the (patient/caregiver).  Consent for procedure obtained. Time Out: Verified patient identification, verified procedure, site/side was marked, verified correct patient position, special equipment/implants available, medications/allergies/relevent history reviewed, required imaging and test results available.  Performed.  The area  was cleaned with iodine and alcohol swabs.    The paraspinal cervical muscles on the left and right were injected using 1 cc's of 40 mg Depomedrol and 4 cc's of 0.5% bupivacaine with a 25 1" needle.    A sterile dressing was applied.  Patient did tolerate procedure well.   ASSESSMENT & PLAN:   Neck pain Acute on chronic neck pain. MRI was reassuring.  - trigger point injections.  - counseled on HEP  - counseled on supportive care  - if no improvement consider PT.

## 2017-12-05 ENCOUNTER — Ambulatory Visit: Payer: Medicare Other | Admitting: Cardiology

## 2017-12-19 ENCOUNTER — Encounter (HOSPITAL_COMMUNITY): Payer: Medicare Other

## 2017-12-19 ENCOUNTER — Other Ambulatory Visit (HOSPITAL_COMMUNITY): Payer: Self-pay | Admitting: *Deleted

## 2017-12-19 DIAGNOSIS — R0602 Shortness of breath: Secondary | ICD-10-CM | POA: Diagnosis not present

## 2017-12-22 ENCOUNTER — Ambulatory Visit (HOSPITAL_COMMUNITY)
Admission: RE | Admit: 2017-12-22 | Discharge: 2017-12-22 | Disposition: A | Discharge status: Home / Self Care | Payer: Medicare Other | Source: Ambulatory Visit | Attending: Nephrology | Admitting: Nephrology

## 2017-12-22 DIAGNOSIS — N189 Chronic kidney disease, unspecified: Secondary | ICD-10-CM | POA: Diagnosis not present

## 2017-12-22 DIAGNOSIS — D631 Anemia in chronic kidney disease: Secondary | ICD-10-CM | POA: Diagnosis not present

## 2017-12-22 MED ORDER — SODIUM CHLORIDE 0.9 % IV SOLN
510.0000 mg | Freq: Once | INTRAVENOUS | Status: AC
  Administered 2017-12-22: 510 mg via INTRAVENOUS
  Filled 2017-12-22: qty 17

## 2017-12-24 ENCOUNTER — Encounter: Payer: Medicare Other | Admitting: Cardiothoracic Surgery

## 2018-01-05 DIAGNOSIS — N184 Chronic kidney disease, stage 4 (severe): Secondary | ICD-10-CM | POA: Diagnosis not present

## 2018-01-05 DIAGNOSIS — N2581 Secondary hyperparathyroidism of renal origin: Secondary | ICD-10-CM | POA: Diagnosis not present

## 2018-01-05 DIAGNOSIS — N189 Chronic kidney disease, unspecified: Secondary | ICD-10-CM | POA: Diagnosis not present

## 2018-01-05 DIAGNOSIS — D631 Anemia in chronic kidney disease: Secondary | ICD-10-CM | POA: Diagnosis not present

## 2018-01-05 DIAGNOSIS — I129 Hypertensive chronic kidney disease with stage 1 through stage 4 chronic kidney disease, or unspecified chronic kidney disease: Secondary | ICD-10-CM | POA: Diagnosis not present

## 2018-01-05 DIAGNOSIS — Z23 Encounter for immunization: Secondary | ICD-10-CM | POA: Diagnosis not present

## 2018-01-05 LAB — BASIC METABOLIC PANEL
BUN: 64 — AB (ref 4–21)
Creatinine: 2.3 — AB (ref 0.5–1.1)
Glucose: 89
Potassium: 3.2 — AB (ref 3.4–5.3)
SODIUM: 139 (ref 137–147)

## 2018-01-05 LAB — HEPATIC FUNCTION PANEL
ALK PHOS: 72 (ref 25–125)
ALT: 8 (ref 7–35)
AST: 16 (ref 13–35)
BILIRUBIN, TOTAL: 0.2

## 2018-01-05 LAB — CBC AND DIFFERENTIAL
HCT: 29 — AB (ref 36–46)
HEMOGLOBIN: 9.4 — AB (ref 12.0–16.0)
NEUTROS ABS: 4
PLATELETS: 265 (ref 150–399)
WBC: 5.9

## 2018-01-16 ENCOUNTER — Encounter: Payer: Self-pay | Admitting: Internal Medicine

## 2018-01-16 NOTE — Progress Notes (Signed)
Abstracted and sent to scan  

## 2018-01-18 DIAGNOSIS — R0602 Shortness of breath: Secondary | ICD-10-CM | POA: Diagnosis not present

## 2018-02-11 ENCOUNTER — Encounter: Payer: Medicare Other | Admitting: Cardiothoracic Surgery

## 2018-02-11 ENCOUNTER — Ambulatory Visit: Payer: Medicare Other | Admitting: Family Medicine

## 2018-02-11 ENCOUNTER — Ambulatory Visit (INDEPENDENT_AMBULATORY_CARE_PROVIDER_SITE_OTHER): Payer: Medicare Other | Admitting: Family Medicine

## 2018-02-11 DIAGNOSIS — M170 Bilateral primary osteoarthritis of knee: Secondary | ICD-10-CM

## 2018-02-11 DIAGNOSIS — M542 Cervicalgia: Secondary | ICD-10-CM

## 2018-02-11 MED ORDER — DICLOFENAC SODIUM 2 % TD SOLN: 1.0000 "application " | Freq: Two times a day (BID) | TRANSDERMAL | 3 refills | Status: DC

## 2018-02-11 NOTE — Assessment & Plan Note (Signed)
Acute change in her neck.  She gets these from time to time.  Has tried muscle relaxer with limited improvement.  Has cardiac and renal complications to limit her medication use. -Injection with anesthetic today.  Did not inject any steroid with most recent injection less than 3 months ago. -Counseled on home exercise therapy and supportive care. -Provided samples of Pennsaid. -Referral to physical therapy

## 2018-02-11 NOTE — Patient Instructions (Signed)
Good to see you  Please continue the warm compress on your neck  Please continue the rubs.  Please try the neck exercises  Please let me know if you want to try physical therapy  Please follow up in 2-3 weeks if your pain isn't improving.

## 2018-02-11 NOTE — Progress Notes (Signed)
[name] - 72 y.o. female MRN [id number]  Date of birth: 03-29-1946  SUBJECTIVE:  Including CC & ROS.  Chief Complaint  Patient presents with  . Neck Pain    pt. reports that she's been having neck spasms/stiffness since this past Monday, 11/4; she reports using a heat pad, OTC rub & muscle relaxer; current pain level 8/10    [name] is a 72 y.o. female that is presenting with neck pain.  This is similar to the pain she experienced in August.  She has these cramps from time to time.  The pain is occurring on the lateral aspect on the right and left side of the neck.  She denies any inciting event.  The pain is worse after waking up.  The pain is localized to this area.  Has tried a muscle relaxer with some improvement.  Is limited in her mobility.  Has been trying home exercises.  Denies any radicular type symptoms.  Reports having bilateral knee pain.  This is acute on chronic in nature.  This limits her mobility to walk.  The pain is localized to the knee.  She received steroid injections in July.  This helps from time to time..  Independent review of her bilateral knee x-rays from July show end-stage degenerative changes in the medial compartment.   Review of Systems  Constitutional: Negative for fever.  HENT: Negative for congestion.   Respiratory: Negative for cough.   Cardiovascular: Negative for chest pain.  Gastrointestinal: Negative for abdominal pain.  Musculoskeletal: Positive for arthralgias, back pain, gait problem and neck pain.  Skin: Negative for color change.  Neurological: Negative for weakness.  Hematological: Negative for adenopathy.    HISTORY: Past Medical, Surgical, Social, and Family History Reviewed & Updated per EMR.   Pertinent Historical Findings include:  Past Medical History:  Diagnosis Date  . Acute pancreatitis 02/04/2017   [name] 02/04/2017  . Adenomatous colon polyp 01/2002  . Anemia, chronic disease    /notes 02/04/2017  . Aortic  valve disease    AI/AS  . Arthritis    "lower back, knees" (02/04/2017)  . CHF (congestive heart failure) ([place])   . CKD (chronic kidney disease), stage III ([place])   . Diverticulosis of colon   . DJD (degenerative joint disease)   . Frequent UTI   . Hemorrhoids   . Hypertension   . Lumbar back pain   . Neuropathy   . Obesity   . Pericardial effusion     in a patient with Diastolic heart  failure and Pericardial effusion  known since last 2 D echo in 11/2016 /notes 02/04/2017  . PONV (postoperative nausea and vomiting)   . Pulmonary HTN ([place])   . Renal cyst   . Venous insufficiency   . Vitamin D deficiency     Past Surgical History:  Procedure Laterality Date  . ABDOMINAL HYSTERECTOMY     "partial"  . AORTIC VALVE REPLACEMENT N/A 05/30/2017   Procedure: AORTIC VALVE REPLACEMENT (AVR);  Surgeon: Prescott Gum, Collier Salina, MD;  Location: [place];  Service: Vascular;  Laterality: N/A;  . COLONOSCOPY W/ BIOPSIES AND POLYPECTOMY     "bx was ok"  . IR THORACENTESIS ASP PLEURAL SPACE W/IMG GUIDE  06/11/2017  . IR THORACENTESIS ASP PLEURAL SPACE W/IMG GUIDE  06/12/2017  . REPAIR OF ACUTE ASCENDING THORACIC AORTIC DISSECTION N/A 05/30/2017   Procedure: REPLACEMENT OF ASCENDING AORTIC ANEURYSM AND REPAIRED CHRONIC ROOT DISSECTION WITH CIRC ARREST;  Surgeon: Ivin Poot, MD;  [facility];  Service: Vascular;  Laterality: N/A;  . TEE WITHOUT CARDIOVERSION N/A 05/28/2017   Procedure: TRANSESOPHAGEAL ECHOCARDIOGRAM (TEE);  Surgeon: Skeet Latch, MD;  Location: [place];  Service: Cardiovascular;  Laterality: N/A;  . TEE WITHOUT CARDIOVERSION N/A 05/30/2017   Procedure: TRANSESOPHAGEAL ECHOCARDIOGRAM (TEE);  Surgeon: Prescott Gum, Collier Salina, MD;  Location: [place];  Service: Open Heart Surgery;  Laterality: N/A;    Allergies  Allergen Reactions  . Minoxidil Palpitations and Other (See Comments)    unable to sleep  . Naproxen Swelling    Family History  Problem Relation Age of Onset  . Diabetes  Mother   . Colon polyps Mother   . Hypertension Mother   . Hypertension Brother   . Hypertension Sister   . Colon cancer Neg Hx      Social History   Socioeconomic History  . Marital status: Married    Spouse name: Juanda Crumble  . Number of children: 1  . Years of education: 1  . Highest education level: Not on file  Occupational History  . Occupation: retired from Administrator, arts  Social Needs  . Financial resource strain: Not on file  . Food insecurity:    Worry: Not on file    Inability: Not on file  . Transportation needs:    Medical: Not on file    Non-medical: Not on file  Tobacco Use  . Smoking status: Former Smoker    Packs/day: 0.40    Years: 15.00    Pack years: 6.00    Types: Cigarettes    Last attempt to quit: 04/08/1982    Years since quitting: 35.8  . Smokeless tobacco: Never Used  Substance and Sexual Activity  . Alcohol use: No  . Drug use: No  . Sexual activity: Never  Lifestyle  . Physical activity:    Days per week: Not on file    Minutes per session: Not on file  . Stress: Not on file  Relationships  . Social connections:    Talks on phone: Not on file    Gets together: Not on file    Attends religious service: Not on file    Active member of club or organization: Not on file    Attends meetings of clubs or organizations: Not on file    Relationship status: Not on file  . Intimate partner violence:    Fear of current or ex partner: Not on file    Emotionally abused: Not on file    Physically abused: Not on file    Forced sexual activity: Not on file  Other Topics Concern  . Not on file  Social History Narrative   Lives at home w/ her husband   Right-handed   Daily caffeine      PHYSICAL EXAM:  VS: BP 128/74 (BP Location: Left Arm, Cuff Size: Normal)   Pulse 80   Temp 97.7 F (36.5 C) (Oral)   Ht 5\' 6"  (1.676 m)   Wt 161 lb (73 kg)   SpO2 98%   BMI 25.99 kg/m  Physical Exam Gen: NAD, alert, cooperative with exam,  ENT: normal  lips, normal nasal mucosa,  Eye: normal EOM, normal conjunctiva and lids CV:  no edema, +2 pedal pulses   Resp: no accessory muscle use, non-labored,  GI: no masses or tenderness, no hernia  Skin: no rashes, no areas of induration  Neuro: normal tone, normal sensation to touch Psych:  normal insight, alert and oriented MSK:  Neck: No tenderness to  palpation of midline spine. Some tenderness to palpation of the paraspinal cervical muscles. Limited flexion and extension. Limited lateral rotation bilaterally. No step-offs appreciated. Normal grip strength. Normal shoulder range of motion. Right and left knee: Mild effusion appreciated Normal flexion and extension. Valgus deformity noted. Instability with valgus testing bilaterally. No pain with patellar grind. Negative McMurray's test. Neurovascular intact   Aspiration/Injection Procedure Note Cheyenna Pankowski 1945-08-09  Procedure: Injection Indications: Neck pain  Procedure Details Consent: Risks of procedure as well as the alternatives and risks of each were explained to the (patient/caregiver).  Consent for procedure obtained. Time Out: Verified patient identification, verified procedure, site/side was marked, verified correct patient position, special equipment/implants available, medications/allergies/relevent history reviewed, required imaging and test results available.  Performed.  The area was cleaned with iodine and alcohol swabs.    The the left and right lateral neck was injected using 3 cc's of 0.25% bupivacaine and 3 cc's of 1% lidocaine with a 25 1" needle.      A sterile dressing was applied.  Patient did tolerate procedure well.      ASSESSMENT & PLAN:   Neck pain Acute change in her neck.  She gets these from time to time.  Has tried muscle relaxer with limited improvement.  Has cardiac and renal complications to limit her medication use. -Injection with anesthetic today.  Did not inject any steroid with  most recent injection less than 3 months ago. -Counseled on home exercise therapy and supportive care. -Provided samples of Pennsaid. -Referral to physical therapy  OA (osteoarthritis) of knee End-stage bilaterally on x-ray.  Has received steroid injections with limited improvement.  Most recent in July. -We will try Visco supplementation. - pennsaid

## 2018-02-11 NOTE — Assessment & Plan Note (Addendum)
End-stage bilaterally on x-ray.  Has received steroid injections with limited improvement.  Most recent in July. -We will try Visco supplementation. - pennsaid

## 2018-02-18 DIAGNOSIS — R0602 Shortness of breath: Secondary | ICD-10-CM | POA: Diagnosis not present

## 2018-02-23 ENCOUNTER — Telehealth: Payer: Self-pay | Admitting: *Deleted

## 2018-02-23 NOTE — Telephone Encounter (Signed)
Patient will call back to schedule Orthovisc injections at a more convenient time-she is aware we received insurance approval

## 2018-02-24 ENCOUNTER — Telehealth: Payer: Self-pay | Admitting: Family Medicine

## 2018-02-24 DIAGNOSIS — E876 Hypokalemia: Secondary | ICD-10-CM | POA: Diagnosis not present

## 2018-02-24 DIAGNOSIS — N189 Chronic kidney disease, unspecified: Secondary | ICD-10-CM | POA: Diagnosis not present

## 2018-02-24 NOTE — Telephone Encounter (Signed)
Copied from [place] [id number]. Topic: Quick Communication - Rx Refill/Question >> Feb 24, 2018 11:51 AM Virl Axe D wrote: Medication: steroid tablet (brand unknown) Pt saw Dr. Raeford Razor on 02/11/18. Dr. Raeford Razor offered to send in 1 week supply of steroid tablets for pt and she declined at that time. Pt called back to stated she would like to try to tablets for her neck pain and wanted to know if Dr. Raeford Razor would call in the Rx for her. CMA Wells Guiles also aware. Please advise pt Cb# [phone]  Has the patient contacted their pharmacy? No. (Agent: If no, request that the patient contact the pharmacy for the refill.) (Agent: If yes, when and what did the pharmacy advise?)  Preferred Pharmacy (with phone number or street name): [id number] - [place], [place] [phone] (Phone) [phone] (Fax)    Agent: Please be advised that RX refills may take up to 3 business days. We ask that you follow-up with your pharmacy.

## 2018-02-25 NOTE — Telephone Encounter (Signed)
Please see medication request of Dr. Raeford Razor

## 2018-02-26 MED ORDER — PREDNISONE 5 MG PO TABS: ORAL_TABLET | ORAL | 0 refills | Status: DC

## 2018-02-26 NOTE — Telephone Encounter (Signed)
Sent prednisone for patient.   Rosemarie Ax, MD [facility] 02/26/2018, 1:42 PM

## 2018-03-02 DIAGNOSIS — I129 Hypertensive chronic kidney disease with stage 1 through stage 4 chronic kidney disease, or unspecified chronic kidney disease: Secondary | ICD-10-CM | POA: Diagnosis not present

## 2018-03-02 DIAGNOSIS — D631 Anemia in chronic kidney disease: Secondary | ICD-10-CM | POA: Diagnosis not present

## 2018-03-02 DIAGNOSIS — R319 Hematuria, unspecified: Secondary | ICD-10-CM | POA: Diagnosis not present

## 2018-03-02 DIAGNOSIS — N189 Chronic kidney disease, unspecified: Secondary | ICD-10-CM | POA: Diagnosis not present

## 2018-03-02 DIAGNOSIS — N2581 Secondary hyperparathyroidism of renal origin: Secondary | ICD-10-CM | POA: Diagnosis not present

## 2018-03-02 DIAGNOSIS — N184 Chronic kidney disease, stage 4 (severe): Secondary | ICD-10-CM | POA: Diagnosis not present

## 2018-03-02 LAB — IRON,TIBC AND FERRITIN PANEL
%SAT: 53
IRON: 98
TIBC: 185
UIBC: 87

## 2018-03-02 LAB — CBC AND DIFFERENTIAL
HEMATOCRIT: 32 — AB (ref 36–46)
HEMOGLOBIN: 10.7 — AB (ref 12.0–16.0)
NEUTROS ABS: 5
Platelets: 244 (ref 150–399)
WBC: 6.9

## 2018-03-02 LAB — HEPATIC FUNCTION PANEL
ALT: 8 (ref 7–35)
AST: 16 (ref 13–35)
Alkaline Phosphatase: 71 (ref 25–125)
BILIRUBIN, TOTAL: 0.2

## 2018-03-02 LAB — BASIC METABOLIC PANEL
BUN: 69 — AB (ref 4–21)
CREATININE: 2.4 — AB (ref 0.5–1.1)
Glucose: 82
POTASSIUM: 2.9 — AB (ref 3.4–5.3)
Sodium: 138 (ref 137–147)

## 2018-03-04 ENCOUNTER — Encounter: Payer: Self-pay | Admitting: Cardiothoracic Surgery

## 2018-03-04 ENCOUNTER — Ambulatory Visit (INDEPENDENT_AMBULATORY_CARE_PROVIDER_SITE_OTHER): Payer: Medicare Other | Admitting: Cardiothoracic Surgery

## 2018-03-04 VITALS — BP 140/82 | HR 82 | Resp 20 | Ht 66.0 in | Wt 168.0 lb

## 2018-03-04 DIAGNOSIS — I7101 Dissection of ascending aorta: Secondary | ICD-10-CM

## 2018-03-04 DIAGNOSIS — Z952 Presence of prosthetic heart valve: Secondary | ICD-10-CM

## 2018-03-04 NOTE — Progress Notes (Signed)
PCP is Hoyt Koch, MD Referring Provider is Minus Breeding, MD  Chief Complaint  Patient presents with  . Thoracic Aortic Dissection    6 month f/u HX of AVR, replacement of ascending thoracic aorta    HPI: Patient returns for scheduled 84-month follow-up after undergoing AVR with bioprosthetic valve [21 mm magna ease] for severe aortic insufficiency and resection and grafting of a sending thoracic aortic dissection with a 28 mm Dacron graft.  She returns for blood pressure check and general review.  She has progressed very nicely since her last visit.  She has lost weight, has diminished edema, and blood pressure is under excellent control on carvedilol twice daily.  She is in sinus rhythm and is complaining mainly of arthritis in her knees and difficulty with ambulation.   Past Medical History:  Diagnosis Date  . Acute pancreatitis 02/04/2017   [name] 02/04/2017  . Adenomatous colon polyp 01/2002  . Anemia, chronic disease    /notes 02/04/2017  . Aortic valve disease    AI/AS  . Arthritis    "lower back, knees" (02/04/2017)  . CHF (congestive heart failure) ([place])   . CKD (chronic kidney disease), stage III ([place])   . Diverticulosis of colon   . DJD (degenerative joint disease)   . Frequent UTI   . Hemorrhoids   . Hypertension   . Lumbar back pain   . Neuropathy   . Obesity   . Pericardial effusion     in a patient with Diastolic heart  failure and Pericardial effusion  known since last 2 D echo in 11/2016 /notes 02/04/2017  . PONV (postoperative nausea and vomiting)   . Pulmonary HTN ([place])   . Renal cyst   . Venous insufficiency   . Vitamin D deficiency     Past Surgical History:  Procedure Laterality Date  . ABDOMINAL HYSTERECTOMY     "partial"  . AORTIC VALVE REPLACEMENT N/A 05/30/2017   Procedure: AORTIC VALVE REPLACEMENT (AVR);  Surgeon: [place] Gum, Collier Salina, MD;  Location: [place];  Service: Vascular;  Laterality: N/A;  . COLONOSCOPY W/ BIOPSIES AND  POLYPECTOMY     "bx was ok"  . IR THORACENTESIS ASP PLEURAL SPACE W/IMG GUIDE  06/11/2017  . IR THORACENTESIS ASP PLEURAL SPACE W/IMG GUIDE  06/12/2017  . REPAIR OF ACUTE ASCENDING THORACIC AORTIC DISSECTION N/A 05/30/2017   Procedure: REPLACEMENT OF ASCENDING AORTIC ANEURYSM AND REPAIRED CHRONIC ROOT DISSECTION WITH CIRC ARREST;  Surgeon: Ivin Poot, MD;  Location: [facility];  Service: Vascular;  Laterality: N/A;  . TEE WITHOUT CARDIOVERSION N/A 05/28/2017   Procedure: TRANSESOPHAGEAL ECHOCARDIOGRAM (TEE);  Surgeon: Skeet Latch, MD;  Location: [place];  Service: Cardiovascular;  Laterality: N/A;  . TEE WITHOUT CARDIOVERSION N/A 05/30/2017   Procedure: TRANSESOPHAGEAL ECHOCARDIOGRAM (TEE);  Surgeon: [place] Gum, Collier Salina, MD;  Location: [facility];  Service: Open Heart Surgery;  Laterality: N/A;    Family History  Problem Relation Age of Onset  . Diabetes Mother   . Colon polyps Mother   . Hypertension Mother   . Hypertension Brother   . Hypertension Sister   . Colon cancer Neg Hx     Social History Social History   Tobacco Use  . Smoking status: Former Smoker    Packs/day: 0.40    Years: 15.00    Pack years: 6.00    Types: Cigarettes    Last attempt to quit: 04/08/1982    Years since quitting: 35.9  . Smokeless tobacco: Never Used  Substance Use  Topics  . Alcohol use: No  . Drug use: No    Current Outpatient Medications  Medication Sig Dispense Refill  . aspirin EC 81 MG tablet Take 81 mg by mouth daily.    . calcitRIOL (ROCALTROL) 0.25 MCG capsule Take 1 capsule (0.25 mcg total) by mouth every other day. Please take every Monday, Wednesday, Friday 30 capsule 1  . calcium carbonate (TUMS - DOSED IN MG ELEMENTAL CALCIUM) 500 MG chewable tablet Chew 2 tablets by mouth daily as needed for indigestion or heartburn.    . carvedilol (COREG) 25 MG tablet TAKE 1/2 TABLET(12.5 MG) BY MOUTH TWICE DAILY WITH A MEAL (Patient taking differently: Take 25 mg by mouth 2 (two) times daily with  a meal. ) 30 tablet 10  . cyclobenzaprine (FLEXERIL) 5 MG tablet Take 1 tablet (5 mg total) by mouth 3 (three) times daily as needed for muscle spasms. 30 tablet 0  . Diclofenac Sodium (PENNSAID) 2 % SOLN Place 1 application onto the skin 2 (two) times daily. 1 Bottle 3  . FAMOTIDINE PO Take 1 tablet by mouth 2 (two) times daily as needed (gas).    . fluticasone (FLONASE) 50 MCG/ACT nasal spray Place 2 sprays daily into both nostrils. (Patient taking differently: Place 2 sprays into both nostrils daily as needed for allergies. ) 16 g 2  . furosemide (LASIX) 80 MG tablet Take 160 mg by mouth 2 (two) times daily.    Marland Kitchen gabapentin (NEURONTIN) 300 MG capsule TAKE 1 CAPSULE(300 MG) BY MOUTH THREE TIMES DAILY AS NEEDED FOR PAIN 90 capsule 0  . hydrocortisone (ANUSOL-HC) 2.5 % rectal cream Place 1 application rectally as needed. 30 g 0  . lidocaine (LIDODERM) 5 % Place 1 patch onto the skin daily. Remove & Discard patch within 12 hours or as directed by MD 30 patch 0  . ondansetron (ZOFRAN) 4 MG tablet Take 1 tablet (4 mg total) by mouth every 8 (eight) hours as needed for nausea or vomiting. 20 tablet 0  . pantoprazole (PROTONIX) 40 MG tablet Take 1 tablet (40 mg total) daily by mouth. (Patient taking differently: Take 40 mg by mouth 2 (two) times daily as needed (indigestion). ) 30 tablet 0  . potassium chloride SA (K-DUR,KLOR-CON) 20 MEQ tablet Take 20 mEq by mouth 2 (two) times daily.    . predniSONE (DELTASONE) 5 MG tablet Take 6 pills for first day, 5 pills second day, 4 pills third day, 3 pills fourth day, 2 pills the fifth day, and 1 pill sixth day. 21 tablet 0  . Propylene Glycol (SYSTANE BALANCE) 0.6 % SOLN Place 1 drop into both eyes daily as needed (for dry eyes).    . traMADol (ULTRAM) 50 MG tablet Take 1 tablet (50 mg total) by mouth every 12 (twelve) hours as needed for severe pain. 28 tablet 0  . triamcinolone ointment (KENALOG) 0.5 % Apply 1 application topically daily as needed (itching).      No current facility-administered medications for this visit.     Allergies  Allergen Reactions  . Minoxidil Palpitations and Other (See Comments)    unable to sleep  . Naproxen Swelling    Review of Systems  No chest pain or orthopnea, Lower extremity edema significantly improved No fever or cough No falls or syncope No abdominal pain or nausea  BP 140/82   Pulse 82   Resp 20   Ht 5\' 6"  (1.676 m)   Wt 168 lb (76.2 kg)   SpO2 96% Comment:  RA  BMI 27.12 kg/m  Physical Exam      Exam    General- alert and comfortable    Neck- no JVD, no cervical adenopathy palpable, no carotid bruit   Lungs- clear without rales, wheezes   Cor- regular rate and rhythm, no murmur , gallop   Abdomen- soft, non-tender   Extremities - warm, non-tender, minimal edema   Neuro- oriented, appropriate, no focal weakness  Diagnostic Tests: None  Impression: Continuing to progress after AVR for severe AI with a bioprosthetic valve and repair of the chronic ascending dissection with a 28 mm Dacron graft under hypothermic circulatory arrest.  Plan: Continue current medications.  Importance of heart healthy diet emphasized to patient.  Importance of antibiotic prophylaxis prior to invasive dental procedures discussed with patient.  Plan to see patient back in February with x-ray at 1 year post surgery.   Len Childs, MD [facility] [phone]

## 2018-03-06 ENCOUNTER — Telehealth: Payer: Self-pay

## 2018-03-06 NOTE — Telephone Encounter (Signed)
Copied from [place] [id number]. Topic: General - Other >> Mar 06, 2018 10:11 AM Carolyn Stare wrote:  Pt call to say she is still having issues with her neck

## 2018-03-09 MED ORDER — BACLOFEN 10 MG PO TABS: 5.0000 mg | ORAL_TABLET | Freq: Three times a day (TID) | ORAL | 0 refills | Status: DC | PRN

## 2018-03-09 NOTE — Telephone Encounter (Signed)
Spoke with patient. Start PT on Wednesday. Will try baclofen.   Rosemarie Ax, MD [facility] 03/09/2018, 2:34 PM

## 2018-03-09 NOTE — Addendum Note (Signed)
Addended by: Rosemarie Ax on: 03/09/2018 02:34 PM   Modules accepted: Orders

## 2018-03-09 NOTE — Telephone Encounter (Signed)
Called pt back, pt states she is having a lot of pain in neck and it is real stiff. Pt states she took prednisone, heating pad and the pennsaid is not helping her neck. Would like to know if anything else can be prescribed or done.

## 2018-03-11 ENCOUNTER — Ambulatory Visit: Payer: Medicare Other | Admitting: Physical Therapy

## 2018-03-18 ENCOUNTER — Encounter: Payer: Self-pay | Admitting: Internal Medicine

## 2018-03-18 DIAGNOSIS — I129 Hypertensive chronic kidney disease with stage 1 through stage 4 chronic kidney disease, or unspecified chronic kidney disease: Secondary | ICD-10-CM | POA: Diagnosis not present

## 2018-03-18 DIAGNOSIS — E876 Hypokalemia: Secondary | ICD-10-CM | POA: Diagnosis not present

## 2018-03-19 NOTE — Telephone Encounter (Signed)
Patient stated the muscle relaxers are not working.  She is still having issues with spasms in her neck. Please advise.

## 2018-03-19 NOTE — Telephone Encounter (Signed)
Spoke with patient about her pain. Advised to see if she can get in sooner with PT. Can try steroid trigger point injection since enough time has passed.   Rosemarie Ax, MD [facility] 03/19/2018, 5:12 PM

## 2018-03-20 ENCOUNTER — Ambulatory Visit (INDEPENDENT_AMBULATORY_CARE_PROVIDER_SITE_OTHER): Payer: Medicare Other | Admitting: Family Medicine

## 2018-03-20 DIAGNOSIS — M542 Cervicalgia: Secondary | ICD-10-CM | POA: Diagnosis not present

## 2018-03-20 DIAGNOSIS — R0602 Shortness of breath: Secondary | ICD-10-CM | POA: Diagnosis not present

## 2018-03-20 NOTE — Progress Notes (Signed)
[name] - 72 y.o. female MRN [id number]  Date of birth: May 12, 1945  SUBJECTIVE:  Including CC & ROS.  No chief complaint on file.   [name] is a 72 y.o. female that is  Presenting with ongoing neck pain.    Review of Systems  HISTORY: Past Medical, Surgical, Social, and Family History Reviewed & Updated per EMR.   Pertinent Historical Findings include:  Past Medical History:  Diagnosis Date  . Acute pancreatitis 02/04/2017   [name] 02/04/2017  . Adenomatous colon polyp 01/2002  . Anemia, chronic disease    /notes 02/04/2017  . Aortic valve disease    AI/AS  . Arthritis    "lower back, knees" (02/04/2017)  . CHF (congestive heart failure) ([place])   . CKD (chronic kidney disease), stage III ([place])   . Diverticulosis of colon   . DJD (degenerative joint disease)   . Frequent UTI   . Hemorrhoids   . Hypertension   . Lumbar back pain   . Neuropathy   . Obesity   . Pericardial effusion     in a patient with Diastolic heart  failure and Pericardial effusion  known since last 2 D echo in 11/2016 /notes 02/04/2017  . PONV (postoperative nausea and vomiting)   . Pulmonary HTN ([place])   . Renal cyst   . Venous insufficiency   . Vitamin D deficiency     Past Surgical History:  Procedure Laterality Date  . ABDOMINAL HYSTERECTOMY     "partial"  . AORTIC VALVE REPLACEMENT N/A 05/30/2017   Procedure: AORTIC VALVE REPLACEMENT (AVR);  Surgeon: [place] Gum, Collier Salina, MD;  Location: [place];  Service: Vascular;  Laterality: N/A;  . COLONOSCOPY W/ BIOPSIES AND POLYPECTOMY     "bx was ok"  . IR THORACENTESIS ASP PLEURAL SPACE W/IMG GUIDE  06/11/2017  . IR THORACENTESIS ASP PLEURAL SPACE W/IMG GUIDE  06/12/2017  . REPAIR OF ACUTE ASCENDING THORACIC AORTIC DISSECTION N/A 05/30/2017   Procedure: REPLACEMENT OF ASCENDING AORTIC ANEURYSM AND REPAIRED CHRONIC ROOT DISSECTION WITH CIRC ARREST;  Surgeon: Ivin Poot, MD;  Location: [place];  Service: Vascular;  Laterality: N/A;  . TEE WITHOUT  CARDIOVERSION N/A 05/28/2017   Procedure: TRANSESOPHAGEAL ECHOCARDIOGRAM (TEE);  Surgeon: Skeet Latch, MD;  Location: [place];  Service: Cardiovascular;  Laterality: N/A;  . TEE WITHOUT CARDIOVERSION N/A 05/30/2017   Procedure: TRANSESOPHAGEAL ECHOCARDIOGRAM (TEE);  Surgeon: [place] Gum, Collier Salina, MD;  Location: [place];  Service: Open Heart Surgery;  Laterality: N/A;    Allergies  Allergen Reactions  . Minoxidil Palpitations and Other (See Comments)    unable to sleep  . Naproxen Swelling    Family History  Problem Relation Age of Onset  . Diabetes Mother   . Colon polyps Mother   . Hypertension Mother   . Hypertension Brother   . Hypertension Sister   . Colon cancer Neg Hx      Social History   Socioeconomic History  . Marital status: Married    Spouse name: Juanda Crumble  . Number of children: 1  . Years of education: 7  . Highest education level: Not on file  Occupational History  . Occupation: retired from [employer]  Social Needs  . Financial resource strain: Not on file  . Food insecurity:    Worry: Not on file    Inability: Not on file  . Transportation needs:    Medical: Not on file    Non-medical: Not on file  Tobacco Use  . Smoking status:  Former Smoker    Packs/day: 0.40    Years: 15.00    Pack years: 6.00    Types: Cigarettes    Last attempt to quit: 04/08/1982    Years since quitting: 35.9  . Smokeless tobacco: Never Used  Substance and Sexual Activity  . Alcohol use: No  . Drug use: No  . Sexual activity: Never  Lifestyle  . Physical activity:    Days per week: Not on file    Minutes per session: Not on file  . Stress: Not on file  Relationships  . Social connections:    Talks on phone: Not on file    Gets together: Not on file    Attends religious service: Not on file    Active member of club or organization: Not on file    Attends meetings of clubs or organizations: Not on file    Relationship status: Not on file  . Intimate partner  violence:    Fear of current or ex partner: Not on file    Emotionally abused: Not on file    Physically abused: Not on file    Forced sexual activity: Not on file  Other Topics Concern  . Not on file  Social History Narrative   Lives at home w/ her husband   Right-handed   Daily caffeine      PHYSICAL EXAM:  VS: There were no vitals taken for this visit. Physical Exam Gen: NAD, alert, cooperative with exam, well-appearing   Aspiration/Injection Procedure Note Adrielle Polakowski 17-Nov-1945  Procedure: Injection Indications: right and left trapezius trigger points  Procedure Details Consent: Risks of procedure as well as the alternatives and risks of each were explained to the (patient/caregiver).  Consent for procedure obtained. Time Out: Verified patient identification, verified procedure, site/side was marked, verified correct patient position, special equipment/implants available, medications/allergies/relevent history reviewed, required imaging and test results available.  Performed.  The area was cleaned with iodine and alcohol swabs.    The right and left trapezius trigger points was injected using 1 cc's of 40 mg Depo-Medrol and 4 cc's of 0.5% bupivacaine with a 25 1" needle.       A sterile dressing was applied.  Patient did tolerate procedure well.       ASSESSMENT & PLAN:   Neck pain Pain has been ongoing. Will be getting in with physical therapy  - trigger point injections today  - counseled on supportive care and HEP

## 2018-03-23 NOTE — Assessment & Plan Note (Signed)
Pain has been ongoing. Will be getting in with physical therapy  - trigger point injections today  - counseled on supportive care and HEP

## 2018-04-03 DIAGNOSIS — N184 Chronic kidney disease, stage 4 (severe): Secondary | ICD-10-CM | POA: Diagnosis not present

## 2018-04-03 DIAGNOSIS — I129 Hypertensive chronic kidney disease with stage 1 through stage 4 chronic kidney disease, or unspecified chronic kidney disease: Secondary | ICD-10-CM | POA: Diagnosis not present

## 2018-04-03 DIAGNOSIS — N189 Chronic kidney disease, unspecified: Secondary | ICD-10-CM | POA: Diagnosis not present

## 2018-04-06 ENCOUNTER — Other Ambulatory Visit: Payer: Self-pay

## 2018-04-06 ENCOUNTER — Ambulatory Visit: Payer: Medicare Other | Attending: Family Medicine | Admitting: Physical Therapy

## 2018-04-06 DIAGNOSIS — M62838 Other muscle spasm: Secondary | ICD-10-CM

## 2018-04-06 DIAGNOSIS — M542 Cervicalgia: Secondary | ICD-10-CM | POA: Diagnosis not present
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
## 2018-04-14 ENCOUNTER — Ambulatory Visit: Payer: Medicare Other | Admitting: Physical Therapy

## 2018-04-20 DIAGNOSIS — R0602 Shortness of breath: Secondary | ICD-10-CM | POA: Diagnosis not present

## 2018-04-29 ENCOUNTER — Ambulatory Visit: Payer: Medicare Other | Admitting: Physical Therapy

## 2018-05-08 ENCOUNTER — Ambulatory Visit: Payer: Self-pay

## 2018-05-08 ENCOUNTER — Encounter: Payer: Self-pay | Admitting: Family Medicine

## 2018-05-08 ENCOUNTER — Ambulatory Visit (INDEPENDENT_AMBULATORY_CARE_PROVIDER_SITE_OTHER): Payer: Medicare Other | Admitting: Family Medicine

## 2018-05-08 VITALS — BP 138/74 | HR 96 | Ht 66.0 in

## 2018-05-08 DIAGNOSIS — M170 Bilateral primary osteoarthritis of knee: Secondary | ICD-10-CM

## 2018-05-08 NOTE — Patient Instructions (Signed)
Good to see you  Take tylenol 650 mg three times a day is the best evidence based medicine we have for arthritis.  Glucosamine sulfate 750mg  twice a day is a supplement that has been shown to help moderate to severe arthritis. Vitamin D 2000 IU daily Fish oil 2 grams daily.  Tumeric 500mg  twice daily.  Capsaicin topically up to four times a day may also help with pain. Please see me back in 3-4 weeks if no better.

## 2018-05-08 NOTE — Progress Notes (Signed)
[name] - 73 y.o. female MRN [id number]  Date of birth: [birth year]  SUBJECTIVE:  Including CC & ROS.  Chief Complaint  Patient presents with  . Knee Pain    bilateral    [name] is a 73 y.o. female that is presenting with acute on chronic worsening bilateral knee pain.  This pain inhibits her ability to walk long distances.  She feels the pain in the medial lateral joint line.  She has swelling intermittently.  The pain is moderate to severe.  Is localized to the knee.  Is limited provement with injections previously.  Denies any inciting event.  Denies any mechanical symptoms.  Pain is sharp and severe.  Limit improvement with current modalities.  Independent review of the left and right knee x-rays from 10/22/2017 shows end-stage degenerative changes of the medial joint line bilaterally.   Review of Systems  Constitutional: Negative for fever.  HENT: Negative for congestion.   Respiratory: Negative for cough.   Cardiovascular: Negative for chest pain.  Gastrointestinal: Negative for abdominal pain.  Musculoskeletal: Positive for arthralgias, back pain, gait problem and joint swelling.  Skin: Negative for color change.  Neurological: Negative for weakness.  Hematological: Negative for adenopathy.  Psychiatric/Behavioral: Negative for agitation.    HISTORY: Past Medical, Surgical, Social, and Family History Reviewed & Updated per EMR.   Pertinent Historical Findings include:  Past Medical History:  Diagnosis Date  . Acute pancreatitis 02/04/2017   [name] 02/04/2017  . Adenomatous colon polyp 01/2002  . Anemia, chronic disease    /notes 02/04/2017  . Aortic valve disease    AI/AS  . Arthritis    "lower back, knees" (02/04/2017)  . CHF (congestive heart failure) ([place])   . CKD (chronic kidney disease), stage III ([place])   . Diverticulosis of colon   . DJD (degenerative joint disease)   . Frequent UTI   . Hemorrhoids   . Hypertension   . Lumbar back pain   .  Neuropathy   . Obesity   . Pericardial effusion     in a patient with Diastolic heart  failure and Pericardial effusion  known since last 2 D echo in 11/2016 /notes 02/04/2017  . PONV (postoperative nausea and vomiting)   . Pulmonary HTN ([place])   . Renal cyst   . Venous insufficiency   . Vitamin D deficiency     Past Surgical History:  Procedure Laterality Date  . ABDOMINAL HYSTERECTOMY     "partial"  . AORTIC VALVE REPLACEMENT N/A 05/30/2017   Procedure: AORTIC VALVE REPLACEMENT (AVR);  Surgeon: [name], Martha Salina, MD;  Location: [facility];  Service: Vascular;  Laterality: N/A;  . COLONOSCOPY W/ BIOPSIES AND POLYPECTOMY     "bx was ok"  . IR THORACENTESIS ASP PLEURAL SPACE W/IMG GUIDE  06/11/2017  . IR THORACENTESIS ASP PLEURAL SPACE W/IMG GUIDE  06/12/2017  . REPAIR OF ACUTE ASCENDING THORACIC AORTIC DISSECTION N/A 05/30/2017   Procedure: REPLACEMENT OF ASCENDING AORTIC ANEURYSM AND REPAIRED CHRONIC ROOT DISSECTION WITH CIRC ARREST;  Surgeon: Martha Poot, MD;  Location: [facility];  Service: Vascular;  Laterality: N/A;  . TEE WITHOUT CARDIOVERSION N/A 05/28/2017   Procedure: TRANSESOPHAGEAL ECHOCARDIOGRAM (TEE);  Surgeon: Martha Latch, MD;  Location: [facility];  Service: Cardiovascular;  Laterality: N/A;  . TEE WITHOUT CARDIOVERSION N/A 05/30/2017   Procedure: TRANSESOPHAGEAL ECHOCARDIOGRAM (TEE);  Surgeon: [name], Martha Salina, MD;  Location: [facility];  Service: Open Heart Surgery;  Laterality: N/A;    Allergies  Allergen Reactions  .  Minoxidil Palpitations and Other (See Comments)    unable to sleep  . Naproxen Swelling    Family History  Problem Relation Age of Onset  . Diabetes Mother   . Colon polyps Mother   . Hypertension Mother   . Hypertension Brother   . Hypertension Sister   . Colon cancer Neg Hx      Social History   Socioeconomic History  . Marital status: Married    Spouse name: Juanda Crumble  . Number of children: 1  . Years of education: 44  . Highest education  level: Not on file  Occupational History  . Occupation: retired from [employer], [employer]  Social Needs  . Financial resource strain: Not on file  . Food insecurity:    Worry: Not on file    Inability: Not on file  . Transportation needs:    Medical: Not on file    Non-medical: Not on file  Tobacco Use  . Smoking status: Former Smoker    Packs/day: 0.40    Years: 15.00    Pack years: 6.00    Types: Cigarettes    Last attempt to quit: 04/08/1982    Years since quitting: 36.1  . Smokeless tobacco: Never Used  Substance and Sexual Activity  . Alcohol use: No  . Drug use: No  . Sexual activity: Never  Lifestyle  . Physical activity:    Days per week: Not on file    Minutes per session: Not on file  . Stress: Not on file  Relationships  . Social connections:    Talks on phone: Not on file    Gets together: Not on file    Attends religious service: Not on file    Active member of club or organization: Not on file    Attends meetings of clubs or organizations: Not on file    Relationship status: Not on file  . Intimate partner violence:    Fear of current or ex partner: Not on file    Emotionally abused: Not on file    Physically abused: Not on file    Forced sexual activity: Not on file  Other Topics Concern  . Not on file  Social History Narrative   Lives at home w/ her husband   Right-handed   Daily caffeine      PHYSICAL EXAM:  VS: BP 138/74   Pulse 96   Ht 5\' 6"  (1.676 m)   SpO2 91%   BMI 27.12 kg/m  Physical Exam Gen: NAD, alert, cooperative with exam, well-appearing ENT: normal lips, normal nasal mucosa,  Eye: normal EOM, normal conjunctiva and lids CV:  no edema, +2 pedal pulses   Resp: no accessory muscle use, non-labored,  Skin: no rashes, no areas of induration  Neuro: normal tone, normal sensation to touch Psych:  normal insight, alert and oriented MSK:  Left and right knee: Mild effusion in each knee. Normal range of motion. Normal strength  resistance. Instability with valgus and varus stress testing. Negative Murray's test. Neurovascular intact   Aspiration/Injection Procedure Note [name] Jun 12, 1945  Procedure: Injection Indications: Left knee pain   Procedure Details Consent: Risks of procedure as well as the alternatives and risks of each were explained to the (patient/caregiver).  Consent for procedure obtained. Time Out: Verified patient identification, verified procedure, site/side was marked, verified correct patient position, special equipment/implants available, medications/allergies/relevent history reviewed, required imaging and test results available.  Performed.  The area was cleaned with iodine and alcohol swabs.  The left knee superior lateral suprapatellar pouch was injected using 4 cc's of 1% lidocaine with a 22 1 1/2" needle.  The syringe was switched and a 4 mL 22 mg/mL of Monovisc was injected. Ultrasound was used. Images were obtained in  Long views showing the injection.    A sterile dressing was applied.  Patient did tolerate procedure well.    Aspiration/Injection Procedure Note Zoie Sarin Jul 02, 1945  Procedure: Injection Indications: right knee pain   Procedure Details Consent: Risks of procedure as well as the alternatives and risks of each were explained to the (patient/caregiver).  Consent for procedure obtained. Time Out: Verified patient identification, verified procedure, site/side was marked, verified correct patient position, special equipment/implants available, medications/allergies/relevent history reviewed, required imaging and test results available.  Performed.  The area was cleaned with iodine and alcohol swabs.    The right knee superior lateral suprapatellar pouch was injected using 4 cc's of 1% lidocaine with a 22 1 1/2" needle.  The syringe was switched and a 4 mL 22 mg/mL of Monovisc was injected. Ultrasound was used. Images were obtained in  Long views showing the  injection.    A sterile dressing was applied.  Patient did tolerate procedure well.      ASSESSMENT & PLAN:   OA (osteoarthritis) of knee Acute on chronic worsening of her bilateral knee pain.  It is related to the degenerative changes. -Monovisc injections bilaterally. -Counseled on home exercise therapy and supportive care. -If no improvement could consider physical therapy versus braces.

## 2018-05-11 NOTE — Assessment & Plan Note (Signed)
Acute on chronic worsening of her bilateral knee pain.  It is related to the degenerative changes. -Monovisc injections bilaterally. -Counseled on home exercise therapy and supportive care. -If no improvement could consider physical therapy versus braces.

## 2018-05-12 ENCOUNTER — Ambulatory Visit: Payer: Medicare Other | Admitting: Physical Therapy

## 2018-05-12 ENCOUNTER — Telehealth: Payer: Self-pay

## 2018-05-12 DIAGNOSIS — M170 Bilateral primary osteoarthritis of knee: Secondary | ICD-10-CM

## 2018-05-12 NOTE — Addendum Note (Signed)
Addended by: Rosemarie Ax on: 05/12/2018 05:01 PM   Modules accepted: Orders

## 2018-05-12 NOTE — Telephone Encounter (Signed)
Ordered the wheelchair incorrectly, prescription for rolling walker sent in.   Rosemarie Ax, MD [facility] 05/12/2018, 5:00 PM

## 2018-05-12 NOTE — Telephone Encounter (Signed)
Copied from [place] [id number]. Topic: General - Other >> May 12, 2018  2:44 PM Wynetta Emery, [place] C wrote: Reason for CRM: pt was seen by Dr. Rosiland Oz, pt says that he was suppose to order her a rolling walker. Pt says that she received a call from [facility] stating that they received request for a wheelchair. Pt would like to know if Dr. Rosiland Oz could update the Rx /order with [facility] for the correct product.   Please assist.

## 2018-05-13 DIAGNOSIS — I5030 Unspecified diastolic (congestive) heart failure: Secondary | ICD-10-CM | POA: Diagnosis not present

## 2018-05-13 DIAGNOSIS — M170 Bilateral primary osteoarthritis of knee: Secondary | ICD-10-CM | POA: Diagnosis not present

## 2018-05-13 DIAGNOSIS — I5033 Acute on chronic diastolic (congestive) heart failure: Secondary | ICD-10-CM | POA: Diagnosis not present

## 2018-05-13 DIAGNOSIS — R0602 Shortness of breath: Secondary | ICD-10-CM | POA: Diagnosis not present

## 2018-05-13 DIAGNOSIS — J9601 Acute respiratory failure with hypoxia: Secondary | ICD-10-CM | POA: Diagnosis not present

## 2018-05-20 ENCOUNTER — Ambulatory Visit: Payer: Medicare Other | Admitting: Physical Therapy

## 2018-05-21 DIAGNOSIS — R0602 Shortness of breath: Secondary | ICD-10-CM | POA: Diagnosis not present

## 2018-05-22 ENCOUNTER — Encounter: Payer: Self-pay | Admitting: Internal Medicine

## 2018-05-22 ENCOUNTER — Other Ambulatory Visit (INDEPENDENT_AMBULATORY_CARE_PROVIDER_SITE_OTHER): Payer: Medicare Other

## 2018-05-22 ENCOUNTER — Ambulatory Visit (INDEPENDENT_AMBULATORY_CARE_PROVIDER_SITE_OTHER): Payer: Medicare Other | Admitting: Internal Medicine

## 2018-05-22 VITALS — BP 170/100 | HR 76 | Temp 98.4°F | Ht 66.0 in

## 2018-05-22 DIAGNOSIS — D631 Anemia in chronic kidney disease: Secondary | ICD-10-CM

## 2018-05-22 DIAGNOSIS — N184 Chronic kidney disease, stage 4 (severe): Secondary | ICD-10-CM

## 2018-05-22 DIAGNOSIS — R110 Nausea: Secondary | ICD-10-CM

## 2018-05-22 DIAGNOSIS — R399 Unspecified symptoms and signs involving the genitourinary system: Secondary | ICD-10-CM | POA: Diagnosis not present

## 2018-05-22 DIAGNOSIS — I10 Essential (primary) hypertension: Secondary | ICD-10-CM | POA: Diagnosis not present

## 2018-05-22 LAB — HEPATIC FUNCTION PANEL
ALT: 7 U/L (ref 0–35)
AST: 15 U/L (ref 0–37)
Albumin: 3.4 g/dL — ABNORMAL LOW (ref 3.5–5.2)
Alkaline Phosphatase: 59 U/L (ref 39–117)
BILIRUBIN TOTAL: 0.4 mg/dL (ref 0.2–1.2)
Bilirubin, Direct: 0.1 mg/dL (ref 0.0–0.3)
Total Protein: 7.2 g/dL (ref 6.0–8.3)

## 2018-05-22 LAB — CBC
HCT: 29.9 % — ABNORMAL LOW (ref 36.0–46.0)
HEMOGLOBIN: 9.9 g/dL — AB (ref 12.0–15.0)
MCHC: 33.2 g/dL (ref 30.0–36.0)
MCV: 87.8 fl (ref 78.0–100.0)
PLATELETS: 341 10*3/uL (ref 150.0–400.0)
RBC: 3.41 Mil/uL — ABNORMAL LOW (ref 3.87–5.11)
RDW: 16.3 % — ABNORMAL HIGH (ref 11.5–15.5)
WBC: 7.6 10*3/uL (ref 4.0–10.5)

## 2018-05-22 LAB — RENAL FUNCTION PANEL
ALBUMIN: 3.4 g/dL — AB (ref 3.5–5.2)
BUN: 79 mg/dL — ABNORMAL HIGH (ref 6–23)
CALCIUM: 9.5 mg/dL (ref 8.4–10.5)
CO2: 30 meq/L (ref 19–32)
CREATININE: 2.33 mg/dL — AB (ref 0.40–1.20)
Chloride: 99 mEq/L (ref 96–112)
GFR: 24.8 mL/min — AB (ref >60.00)
Glucose, Bld: 91 mg/dL (ref 70–99)
Phosphorus: 2.8 mg/dL (ref 2.3–4.6)
Potassium: 3.1 mEq/L — ABNORMAL LOW (ref 3.5–5.1)
Sodium: 138 mEq/L (ref 135–145)

## 2018-05-22 LAB — POCT URINALYSIS DIPSTICK
Bilirubin, UA: NEGATIVE
Glucose, UA: NEGATIVE
KETONES UA: NEGATIVE
LEUKOCYTES UA: NEGATIVE
NITRITE UA: NEGATIVE
PROTEIN UA: POSITIVE — AB
RBC UA: POSITIVE
SPEC GRAV UA: 1.015 (ref 1.010–1.025)
Urobilinogen, UA: 0.2 E.U./dL
pH, UA: 6 (ref 5.0–8.0)

## 2018-05-22 MED ORDER — CYCLOBENZAPRINE HCL 5 MG PO TABS: 5.0000 mg | ORAL_TABLET | Freq: Three times a day (TID) | ORAL | 2 refills | Status: DC | PRN

## 2018-05-22 MED ORDER — GABAPENTIN 300 MG PO CAPS: ORAL_CAPSULE | ORAL | 3 refills | Status: DC

## 2018-05-22 MED ORDER — ONDANSETRON HCL 4 MG/2ML IJ SOLN
4.0000 mg | Freq: Once | INTRAMUSCULAR | Status: AC
  Administered 2018-05-22: 4 mg via INTRAMUSCULAR

## 2018-05-22 MED ORDER — ONDANSETRON HCL 4 MG PO TABS: 4.0000 mg | ORAL_TABLET | Freq: Three times a day (TID) | ORAL | 0 refills | Status: DC | PRN

## 2018-05-22 NOTE — Patient Instructions (Addendum)
We will check the blood work today for any signs of infection.   We have sent in nausea medicine to take if needed.

## 2018-05-22 NOTE — Progress Notes (Signed)
   Subjective:   Patient ID: [name], female    DOB: 07-27-1945, 73 y.o.   MRN: [id number]  HPI The patient is a 73 YO female coming in for nausea. Started about 2-3 days ago. Accompanied by some vomiting and dry heaving. She did take a tramadol two days in a row as well as gabapentin. Sometimes these medications make her feel bad. This was about 1 week ago however. She denies sick contacts. Denies fevers or chills. Appetite is poor and not able to take medications due to nausea and vomiting. Denies diarrhea or constipation. Denies blood in stool or vomit. Denies chest pains or SOB. Denies flu-like symptoms. Did have a very old nausea medicine and took that but it was years old and did not help. She is worried about UTI as she has had those in the past.    Review of Systems  Constitutional: Positive for activity change, appetite change and fatigue.  HENT: Negative.   Eyes: Negative.   Respiratory: Negative for cough, chest tightness and shortness of breath.   Cardiovascular: Negative for chest pain, palpitations and leg swelling.  Gastrointestinal: Positive for nausea and vomiting. Negative for abdominal distention, abdominal pain, anal bleeding, blood in stool, constipation, diarrhea and rectal pain.  Musculoskeletal: Negative.   Skin: Negative.   Neurological: Positive for weakness.  Psychiatric/Behavioral: Negative.     Objective:  Physical Exam Constitutional:      Appearance: She is well-developed. She is obese.     Comments: Appears sick  HENT:     Head: Normocephalic and atraumatic.  Neck:     Musculoskeletal: Normal range of motion.  Cardiovascular:     Rate and Rhythm: Normal rate and regular rhythm.  Pulmonary:     Effort: Pulmonary effort is normal. No respiratory distress.     Breath sounds: Normal breath sounds. No wheezing or rales.  Abdominal:     General: Bowel sounds are normal. There is no distension.     Palpations: Abdomen is soft. There is no mass.   Tenderness: There is no abdominal tenderness. There is no rebound.     Hernia: No hernia is present.  Musculoskeletal:        General: Tenderness present.  Skin:    General: Skin is warm and dry.  Neurological:     Mental Status: She is alert and oriented to person, place, and time.     Coordination: Coordination abnormal.     Comments: Cane/walker at home and in wheelchair today     Vitals:   05/22/18 1429  BP: (!) 170/100  Pulse: 76  Temp: 98.4 F (36.9 C)  TempSrc: Oral  SpO2: 97%  Height: 5\' 6"  (1.676 m)    Assessment & Plan:  Zofran 4 mg IM given at visit  Visit time 25 minutes: greater than 50% of that time was spent in face to face counseling and coordination of care with the patient: counseled about possible causes as well as treatment and consideration to her other medical problems which could be affected by her stomach problems.

## 2018-05-23 DIAGNOSIS — R110 Nausea: Secondary | ICD-10-CM | POA: Insufficient documentation

## 2018-05-23 NOTE — Assessment & Plan Note (Signed)
BP is elevated due to not being able to take medications. Given zofran today and rx for this so she can start taking medications again. She denies current signs of urgency although BP is elevated.

## 2018-05-23 NOTE — Assessment & Plan Note (Signed)
Checking labs to ensure stability to Hg levels. She does shots every 2 weeks at home.

## 2018-05-23 NOTE — Assessment & Plan Note (Signed)
Could be stemming from UTI so U/A done in office without signs of infection. Checking labs to evaluate cause. Given zofran 4 mg IM at the visit and rx for zofran done as well.

## 2018-06-04 ENCOUNTER — Ambulatory Visit: Payer: Medicare Other | Admitting: Cardiology

## 2018-06-19 DIAGNOSIS — I5033 Acute on chronic diastolic (congestive) heart failure: Secondary | ICD-10-CM | POA: Diagnosis not present

## 2018-06-19 DIAGNOSIS — I5030 Unspecified diastolic (congestive) heart failure: Secondary | ICD-10-CM | POA: Diagnosis not present

## 2018-06-24 DIAGNOSIS — N390 Urinary tract infection, site not specified: Secondary | ICD-10-CM | POA: Diagnosis not present

## 2018-06-24 DIAGNOSIS — N189 Chronic kidney disease, unspecified: Secondary | ICD-10-CM | POA: Diagnosis not present

## 2018-06-24 DIAGNOSIS — N184 Chronic kidney disease, stage 4 (severe): Secondary | ICD-10-CM | POA: Diagnosis not present

## 2018-07-02 ENCOUNTER — Encounter: Payer: Self-pay | Admitting: Physical Therapy

## 2018-07-11 ENCOUNTER — Other Ambulatory Visit: Payer: Self-pay

## 2018-07-11 ENCOUNTER — Inpatient Hospital Stay (HOSPITAL_COMMUNITY): Payer: Medicare Other

## 2018-07-11 ENCOUNTER — Inpatient Hospital Stay (HOSPITAL_COMMUNITY)
Admission: EM | Admit: 2018-07-11 | Discharge: 2018-07-16 | DRG: 291 | Disposition: A | Discharge status: Home / Self Care | Payer: Medicare Other | Attending: Internal Medicine | Admitting: Internal Medicine

## 2018-07-11 ENCOUNTER — Emergency Department (HOSPITAL_COMMUNITY): Payer: Medicare Other

## 2018-07-11 DIAGNOSIS — Z8249 Family history of ischemic heart disease and other diseases of the circulatory system: Secondary | ICD-10-CM

## 2018-07-11 DIAGNOSIS — N184 Chronic kidney disease, stage 4 (severe): Secondary | ICD-10-CM | POA: Diagnosis present

## 2018-07-11 DIAGNOSIS — Z87891 Personal history of nicotine dependence: Secondary | ICD-10-CM

## 2018-07-11 DIAGNOSIS — J9601 Acute respiratory failure with hypoxia: Secondary | ICD-10-CM | POA: Diagnosis not present

## 2018-07-11 DIAGNOSIS — R000 Tachycardia, unspecified: Secondary | ICD-10-CM | POA: Diagnosis not present

## 2018-07-11 DIAGNOSIS — J9621 Acute and chronic respiratory failure with hypoxia: Secondary | ICD-10-CM | POA: Diagnosis present

## 2018-07-11 DIAGNOSIS — Z7982 Long term (current) use of aspirin: Secondary | ICD-10-CM | POA: Diagnosis not present

## 2018-07-11 DIAGNOSIS — Z87440 Personal history of urinary (tract) infections: Secondary | ICD-10-CM

## 2018-07-11 DIAGNOSIS — Z888 Allergy status to other drugs, medicaments and biological substances status: Secondary | ICD-10-CM

## 2018-07-11 DIAGNOSIS — R05 Cough: Secondary | ICD-10-CM | POA: Diagnosis not present

## 2018-07-11 DIAGNOSIS — Z7401 Bed confinement status: Secondary | ICD-10-CM | POA: Diagnosis not present

## 2018-07-11 DIAGNOSIS — M170 Bilateral primary osteoarthritis of knee: Secondary | ICD-10-CM | POA: Diagnosis not present

## 2018-07-11 DIAGNOSIS — D638 Anemia in other chronic diseases classified elsewhere: Secondary | ICD-10-CM | POA: Diagnosis present

## 2018-07-11 DIAGNOSIS — Z993 Dependence on wheelchair: Secondary | ICD-10-CM | POA: Diagnosis not present

## 2018-07-11 DIAGNOSIS — I272 Pulmonary hypertension, unspecified: Secondary | ICD-10-CM | POA: Diagnosis present

## 2018-07-11 DIAGNOSIS — I130 Hypertensive heart and chronic kidney disease with heart failure and stage 1 through stage 4 chronic kidney disease, or unspecified chronic kidney disease: Secondary | ICD-10-CM | POA: Diagnosis not present

## 2018-07-11 DIAGNOSIS — I5033 Acute on chronic diastolic (congestive) heart failure: Secondary | ICD-10-CM | POA: Diagnosis present

## 2018-07-11 DIAGNOSIS — N390 Urinary tract infection, site not specified: Secondary | ICD-10-CM | POA: Diagnosis not present

## 2018-07-11 DIAGNOSIS — Z79891 Long term (current) use of opiate analgesic: Secondary | ICD-10-CM

## 2018-07-11 DIAGNOSIS — R0902 Hypoxemia: Secondary | ICD-10-CM | POA: Diagnosis not present

## 2018-07-11 DIAGNOSIS — Z20828 Contact with and (suspected) exposure to other viral communicable diseases: Secondary | ICD-10-CM | POA: Diagnosis present

## 2018-07-11 DIAGNOSIS — B952 Enterococcus as the cause of diseases classified elsewhere: Secondary | ICD-10-CM | POA: Diagnosis not present

## 2018-07-11 DIAGNOSIS — Z7951 Long term (current) use of inhaled steroids: Secondary | ICD-10-CM | POA: Diagnosis not present

## 2018-07-11 DIAGNOSIS — R0602 Shortness of breath: Secondary | ICD-10-CM | POA: Diagnosis not present

## 2018-07-11 DIAGNOSIS — R509 Fever, unspecified: Secondary | ICD-10-CM

## 2018-07-11 DIAGNOSIS — Z833 Family history of diabetes mellitus: Secondary | ICD-10-CM

## 2018-07-11 DIAGNOSIS — I7101 Dissection of thoracic aorta: Secondary | ICD-10-CM | POA: Diagnosis present

## 2018-07-11 DIAGNOSIS — I10 Essential (primary) hypertension: Secondary | ICD-10-CM | POA: Diagnosis not present

## 2018-07-11 DIAGNOSIS — J9622 Acute and chronic respiratory failure with hypercapnia: Secondary | ICD-10-CM | POA: Diagnosis not present

## 2018-07-11 DIAGNOSIS — Z8371 Family history of colonic polyps: Secondary | ICD-10-CM

## 2018-07-11 DIAGNOSIS — Z9911 Dependence on respirator [ventilator] status: Secondary | ICD-10-CM | POA: Diagnosis not present

## 2018-07-11 DIAGNOSIS — I509 Heart failure, unspecified: Secondary | ICD-10-CM

## 2018-07-11 DIAGNOSIS — M7989 Other specified soft tissue disorders: Secondary | ICD-10-CM | POA: Diagnosis not present

## 2018-07-11 DIAGNOSIS — Z90710 Acquired absence of both cervix and uterus: Secondary | ICD-10-CM | POA: Diagnosis not present

## 2018-07-11 DIAGNOSIS — Z79899 Other long term (current) drug therapy: Secondary | ICD-10-CM | POA: Diagnosis not present

## 2018-07-11 DIAGNOSIS — R0603 Acute respiratory distress: Secondary | ICD-10-CM | POA: Diagnosis not present

## 2018-07-11 DIAGNOSIS — E876 Hypokalemia: Secondary | ICD-10-CM | POA: Diagnosis not present

## 2018-07-11 DIAGNOSIS — E8770 Fluid overload, unspecified: Secondary | ICD-10-CM | POA: Diagnosis not present

## 2018-07-11 DIAGNOSIS — Z886 Allergy status to analgesic agent status: Secondary | ICD-10-CM

## 2018-07-11 DIAGNOSIS — Z953 Presence of xenogenic heart valve: Secondary | ICD-10-CM

## 2018-07-11 DIAGNOSIS — Z86010 Personal history of colonic polyps: Secondary | ICD-10-CM | POA: Diagnosis not present

## 2018-07-11 DIAGNOSIS — R0600 Dyspnea, unspecified: Secondary | ICD-10-CM | POA: Diagnosis not present

## 2018-07-11 LAB — COMPREHENSIVE METABOLIC PANEL
ALT: 16 U/L (ref 0–44)
AST: 26 U/L (ref 15–41)
Albumin: 3.7 g/dL (ref 3.5–5.0)
Alkaline Phosphatase: 79 U/L (ref 38–126)
Anion gap: 14 (ref 5–15)
BUN: 50 mg/dL — ABNORMAL HIGH (ref 8–23)
CO2: 25 mmol/L (ref 22–32)
Calcium: 9.7 mg/dL (ref 8.9–10.3)
Chloride: 101 mmol/L (ref 98–111)
Creatinine, Ser: 2.44 mg/dL — ABNORMAL HIGH (ref 0.44–1.00)
GFR calc Af Amer: 22 mL/min — ABNORMAL LOW (ref >60)
GFR calc non Af Amer: 19 mL/min — ABNORMAL LOW (ref >60)
Glucose, Bld: 226 mg/dL — ABNORMAL HIGH (ref 70–99)
Potassium: 3.5 mmol/L (ref 3.5–5.1)
Sodium: 140 mmol/L (ref 135–145)
Total Bilirubin: 0.9 mg/dL (ref 0.3–1.2)
Total Protein: 7.6 g/dL (ref 6.5–8.1)

## 2018-07-11 LAB — POCT I-STAT 7, (LYTES, BLD GAS, ICA,H+H)
Acid-Base Excess: 6 mmol/L — ABNORMAL HIGH (ref 0.0–2.0)
Acid-Base Excess: 7 mmol/L — ABNORMAL HIGH (ref 0.0–2.0)
Bicarbonate: 33.8 mmol/L — ABNORMAL HIGH (ref 20.0–28.0)
Bicarbonate: 31 mmol/L — ABNORMAL HIGH (ref 20.0–28.0)
Calcium, Ion: 1.31 mmol/L (ref 1.15–1.40)
Calcium, Ion: 1.26 mmol/L (ref 1.15–1.40)
HCT: 29 % — ABNORMAL LOW (ref 36.0–46.0)
HCT: 26 % — ABNORMAL LOW (ref 36.0–46.0)
Hemoglobin: 9.9 g/dL — ABNORMAL LOW (ref 12.0–15.0)
Hemoglobin: 8.8 g/dL — ABNORMAL LOW (ref 12.0–15.0)
O2 Saturation: 99 %
O2 Saturation: 99 %
Potassium: 3.1 mmol/L — ABNORMAL LOW (ref 3.5–5.1)
Potassium: 3.2 mmol/L — ABNORMAL LOW (ref 3.5–5.1)
Sodium: 143 mmol/L (ref 135–145)
Sodium: 144 mmol/L (ref 135–145)
TCO2: 36 mmol/L — ABNORMAL HIGH (ref 22–32)
TCO2: 32 mmol/L (ref 22–32)
pCO2 arterial: 71.5 mmHg (ref 32.0–48.0)
pCO2 arterial: 41.4 mmHg (ref 32.0–48.0)
pH, Arterial: 7.283 — ABNORMAL LOW (ref 7.350–7.450)
pH, Arterial: 7.482 — ABNORMAL HIGH (ref 7.350–7.450)
pO2, Arterial: 191 mmHg — ABNORMAL HIGH (ref 83.0–108.0)
pO2, Arterial: 108 mmHg (ref 83.0–108.0)

## 2018-07-11 LAB — URINALYSIS, ROUTINE W REFLEX MICROSCOPIC
Bilirubin Urine: NEGATIVE
Glucose, UA: NEGATIVE mg/dL
Ketones, ur: NEGATIVE mg/dL
Leukocytes,Ua: NEGATIVE
Nitrite: NEGATIVE
Protein, ur: 100 mg/dL — AB
Specific Gravity, Urine: 1.009 (ref 1.005–1.030)
pH: 6 (ref 5.0–8.0)

## 2018-07-11 LAB — CBC WITH DIFFERENTIAL/PLATELET
Abs Immature Granulocytes: 0.09 10*3/uL — ABNORMAL HIGH (ref 0.00–0.07)
Basophils Absolute: 0 10*3/uL (ref 0.0–0.1)
Basophils Relative: 0 %
Eosinophils Absolute: 0.2 10*3/uL (ref 0.0–0.5)
Eosinophils Relative: 1 %
HCT: 33 % — ABNORMAL LOW (ref 36.0–46.0)
Hemoglobin: 9.9 g/dL — ABNORMAL LOW (ref 12.0–15.0)
Immature Granulocytes: 1 %
Lymphocytes Relative: 9 %
Lymphs Abs: 1.4 10*3/uL (ref 0.7–4.0)
MCH: 28.2 pg (ref 26.0–34.0)
MCHC: 30 g/dL (ref 30.0–36.0)
MCV: 94 fL (ref 80.0–100.0)
Monocytes Absolute: 0.7 10*3/uL (ref 0.1–1.0)
Monocytes Relative: 5 %
Neutro Abs: 13.7 10*3/uL — ABNORMAL HIGH (ref 1.7–7.7)
Neutrophils Relative %: 84 %
Platelets: 267 10*3/uL (ref 150–400)
RBC: 3.51 MIL/uL — ABNORMAL LOW (ref 3.87–5.11)
RDW: 17.2 % — ABNORMAL HIGH (ref 11.5–15.5)
WBC: 16.1 10*3/uL — ABNORMAL HIGH (ref 4.0–10.5)
nRBC: 0 % (ref 0.0–0.2)

## 2018-07-11 LAB — CBC
HCT: 27.8 % — ABNORMAL LOW (ref 36.0–46.0)
Hemoglobin: 8.5 g/dL — ABNORMAL LOW (ref 12.0–15.0)
MCH: 27.9 pg (ref 26.0–34.0)
MCHC: 30.6 g/dL (ref 30.0–36.0)
MCV: 91.1 fL (ref 80.0–100.0)
Platelets: 186 10*3/uL (ref 150–400)
RBC: 3.05 MIL/uL — ABNORMAL LOW (ref 3.87–5.11)
RDW: 17.2 % — ABNORMAL HIGH (ref 11.5–15.5)
WBC: 10.4 10*3/uL (ref 4.0–10.5)
nRBC: 0 % (ref 0.0–0.2)

## 2018-07-11 LAB — CREATININE, SERUM
Creatinine, Ser: 2.44 mg/dL — ABNORMAL HIGH (ref 0.44–1.00)
GFR calc Af Amer: 22 mL/min — ABNORMAL LOW (ref >60)
GFR calc non Af Amer: 19 mL/min — ABNORMAL LOW (ref >60)

## 2018-07-11 LAB — BRAIN NATRIURETIC PEPTIDE: B Natriuretic Peptide: 2661.4 pg/mL — ABNORMAL HIGH (ref 0.0–100.0)

## 2018-07-11 LAB — TROPONIN I: Troponin I: 0.04 ng/mL (ref <0.03)

## 2018-07-11 LAB — GLUCOSE, CAPILLARY
Glucose-Capillary: 91 mg/dL (ref 70–99)
Glucose-Capillary: 97 mg/dL (ref 70–99)

## 2018-07-11 LAB — TRIGLYCERIDES: Triglycerides: 87 mg/dL (ref <150)

## 2018-07-11 LAB — MRSA PCR SCREENING: MRSA by PCR: NEGATIVE

## 2018-07-11 MED ORDER — ALBUTEROL SULFATE (2.5 MG/3ML) 0.083% IN NEBU
2.5000 mg | INHALATION_SOLUTION | RESPIRATORY_TRACT | Status: DC
  Administered 2018-07-11 – 2018-07-12 (×5): 2.5 mg via RESPIRATORY_TRACT
  Filled 2018-07-11 (×4): qty 3

## 2018-07-11 MED ORDER — SODIUM CHLORIDE 0.9 % IV BOLUS
500.0000 mL | Freq: Once | INTRAVENOUS | Status: AC
  Administered 2018-07-11: 500 mL via INTRAVENOUS

## 2018-07-11 MED ORDER — PROPOFOL 1000 MG/100ML IV EMUL
5.0000 ug/kg/min | INTRAVENOUS | Status: DC
  Administered 2018-07-11: 10 ug/kg/min via INTRAVENOUS
  Administered 2018-07-11 (×3): 60 ug/kg/min via INTRAVENOUS
  Administered 2018-07-12: 50 ug/kg/min via INTRAVENOUS
  Administered 2018-07-12: 40 ug/kg/min via INTRAVENOUS
  Filled 2018-07-11 (×5): qty 100

## 2018-07-11 MED ORDER — ASPIRIN 81 MG PO CHEW
324.0000 mg | CHEWABLE_TABLET | ORAL | Status: AC
  Administered 2018-07-11: 324 mg via ORAL
  Filled 2018-07-11: qty 4

## 2018-07-11 MED ORDER — FAMOTIDINE 40 MG/5ML PO SUSR
20.0000 mg | Freq: Every day | ORAL | Status: DC
  Administered 2018-07-11 – 2018-07-15 (×5): 20 mg
  Filled 2018-07-11 (×5): qty 2.5

## 2018-07-11 MED ORDER — VITAL HIGH PROTEIN PO LIQD
1000.0000 mL | ORAL | Status: DC
  Administered 2018-07-11 – 2018-07-12 (×2): 1000 mL

## 2018-07-11 MED ORDER — ETOMIDATE 2 MG/ML IV SOLN
20.0000 mg | Freq: Once | INTRAVENOUS | Status: AC
  Administered 2018-07-11: 20 mg via INTRAVENOUS

## 2018-07-11 MED ORDER — FENTANYL CITRATE (PF) 100 MCG/2ML IJ SOLN: 50.0000 ug | INTRAMUSCULAR | Status: DC | PRN

## 2018-07-11 MED ORDER — ORAL CARE MOUTH RINSE
15.0000 mL | OROMUCOSAL | Status: DC
  Administered 2018-07-11 – 2018-07-12 (×10): 15 mL via OROMUCOSAL

## 2018-07-11 MED ORDER — SENNOSIDES 8.8 MG/5ML PO SYRP
5.0000 mL | ORAL_SOLUTION | Freq: Two times a day (BID) | ORAL | Status: DC | PRN
  Filled 2018-07-11: qty 5

## 2018-07-11 MED ORDER — HEPARIN SODIUM (PORCINE) 5000 UNIT/ML IJ SOLN
5000.0000 [IU] | Freq: Three times a day (TID) | INTRAMUSCULAR | Status: DC
  Administered 2018-07-11 – 2018-07-12 (×3): 5000 [IU] via SUBCUTANEOUS
  Filled 2018-07-11 (×3): qty 1

## 2018-07-11 MED ORDER — PROPOFOL 1000 MG/100ML IV EMUL
INTRAVENOUS | Status: AC
  Filled 2018-07-11: qty 100

## 2018-07-11 MED ORDER — CHLORHEXIDINE GLUCONATE 0.12% ORAL RINSE (MEDLINE KIT)
15.0000 mL | Freq: Two times a day (BID) | OROMUCOSAL | Status: DC
  Administered 2018-07-11 – 2018-07-12 (×2): 15 mL via OROMUCOSAL

## 2018-07-11 MED ORDER — FUROSEMIDE 10 MG/ML IJ SOLN
60.0000 mg | Freq: Once | INTRAMUSCULAR | Status: AC
  Administered 2018-07-11: 60 mg via INTRAVENOUS
  Filled 2018-07-11: qty 6

## 2018-07-11 MED ORDER — ONDANSETRON HCL 4 MG/2ML IJ SOLN
4.0000 mg | Freq: Four times a day (QID) | INTRAMUSCULAR | Status: DC | PRN
  Administered 2018-07-15: 4 mg via INTRAVENOUS
  Filled 2018-07-11: qty 2

## 2018-07-11 MED ORDER — CALCITRIOL 0.25 MCG PO CAPS
0.2500 ug | ORAL_CAPSULE | ORAL | Status: DC
  Administered 2018-07-13 – 2018-07-15 (×2): 0.25 ug via ORAL
  Filled 2018-07-11 (×2): qty 1

## 2018-07-11 MED ORDER — ROCURONIUM BROMIDE 50 MG/5ML IV SOLN
50.0000 mg | Freq: Once | INTRAVENOUS | Status: AC
  Administered 2018-07-11: 50 mg via INTRAVENOUS
  Filled 2018-07-11: qty 5

## 2018-07-11 MED ORDER — FENTANYL CITRATE (PF) 100 MCG/2ML IJ SOLN
100.0000 ug | INTRAMUSCULAR | Status: DC | PRN
  Administered 2018-07-11: 100 ug via INTRAVENOUS
  Filled 2018-07-11: qty 2

## 2018-07-11 MED ORDER — ALBUTEROL SULFATE HFA 108 (90 BASE) MCG/ACT IN AERS
6.0000 | INHALATION_SPRAY | Freq: Once | RESPIRATORY_TRACT | Status: AC
  Administered 2018-07-11: 6 via RESPIRATORY_TRACT
  Filled 2018-07-11: qty 6.7

## 2018-07-11 MED ORDER — FUROSEMIDE 10 MG/ML IJ SOLN
80.0000 mg | Freq: Two times a day (BID) | INTRAMUSCULAR | Status: DC
  Administered 2018-07-11 – 2018-07-15 (×8): 80 mg via INTRAVENOUS
  Filled 2018-07-11 (×8): qty 8

## 2018-07-11 MED ORDER — ASPIRIN 300 MG RE SUPP: 300.0000 mg | RECTAL | Status: AC

## 2018-07-11 MED ORDER — FENTANYL CITRATE (PF) 100 MCG/2ML IJ SOLN: 100.0000 ug | INTRAMUSCULAR | Status: DC | PRN

## 2018-07-11 NOTE — ED Triage Notes (Signed)
SOB three days at home, increasing. Dry cough x2 days, no fever or chest pain. Increasing edema. Low grade fever 1 month ago. Wheezing and diminished, half an abutrerol treatment for EMS, on non-rebreather on arrival, 100%. Hypertensive for EMS

## 2018-07-11 NOTE — ED Notes (Signed)
[place] husband.  [phone]

## 2018-07-11 NOTE — ED Notes (Signed)
Preparing patient for intubation. MD, TR, 2 nurses and EMT.

## 2018-07-11 NOTE — ED Provider Notes (Signed)
Emergency Department Provider Note   I have reviewed the triage vital signs and the nursing notes.   HISTORY  Chief Complaint Shortness of Breath   HPI [name] is a 73 y.o. female with PMH of dCHF, CKD, HTN, Pulmonary HTN, and anemia of chronic disease presents to the ED with SOB and cough worsening over the last 3 days. Patient with no CP. EMS called out for shortness of breath.  They found the patient to be hypoxic and in respiratory distress.  They gave a nebulizer on scene and called in for permission to use CPAP which was denied.  Patient was started on nonrebreather and transported in.  EMS reports some improvement in route but only mild improvement.  Patient denies any chest pain.  She states that she has not known of any fevers for approximately 1 month ago when she showed up for an appointment was found to be febrile.  She has not had shaking chills or body aches.  No known sick contacts.   Level 5 caveat: Respiratory Distress.   Past Medical History:  Diagnosis Date  . Acute pancreatitis 02/04/2017   [name] 02/04/2017  . Adenomatous colon polyp 01/2002  . Anemia, chronic disease    /notes 02/04/2017  . Aortic valve disease    AI/AS  . Arthritis    "lower back, knees" (02/04/2017)  . CHF (congestive heart failure) ([place])   . CKD (chronic kidney disease), stage III ([place])   . Diverticulosis of colon   . DJD (degenerative joint disease)   . Frequent UTI   . Hemorrhoids   . Hypertension   . Lumbar back pain   . Neuropathy   . Obesity   . Pericardial effusion     in a patient with Diastolic heart  failure and Pericardial effusion  known since last 2 D echo in 11/2016 /notes 02/04/2017  . PONV (postoperative nausea and vomiting)   . Pulmonary HTN ([place])   . Renal cyst   . Venous insufficiency   . Vitamin D deficiency     Patient Active Problem List   Diagnosis Date Noted  . Nausea 05/23/2018  . Leg swelling 11/24/2017  . Macrocytic anemia 06/26/2017  .  Bilateral leg weakness 06/26/2017  . Cough 06/26/2017  . S/P AVR 05/30/2017  . Ascending aortic dissection ([place])   . Chronic diastolic CHF (congestive heart failure) ([place]) 04/19/2017  . Nonrheumatic aortic valve insufficiency 03/10/2017  . Pulmonary HTN ([place]) 03/10/2017  . Neck pain 02/11/2017  . Lobar pneumonia ([place])   . Acute respiratory failure with hypoxia ([place])   . Pericardial effusion 02/04/2017  . Abnormal bone marrow examination 01/24/2017  . Stenosis of right carotid artery 08/27/2016  . LBBB (left bundle branch block) 08/27/2016  . Left-sided tinnitus 08/16/2016  . Anemia in chronic kidney disease 11/02/2015  . Acute renal failure superimposed on stage 4 chronic kidney disease ([place]) 04/26/2014  . Complicated UTI (urinary tract infection) 04/26/2014  . Diastolic congestive heart failure ([place]) 04/26/2014  . OA (osteoarthritis) of knee 03/23/2012  . Vitamin D deficiency 04/13/2008  . OBESITY 02/23/2008  . Radiculopathy 02/23/2008  . Essential hypertension 02/23/2008  . Venous (peripheral) insufficiency 02/23/2008    Past Surgical History:  Procedure Laterality Date  . ABDOMINAL HYSTERECTOMY     "partial"  . AORTIC VALVE REPLACEMENT N/A 05/30/2017   Procedure: AORTIC VALVE REPLACEMENT (AVR);  Surgeon: Prescott Gum, Collier Salina, MD;  Location: [facility];  Service: Vascular;  Laterality: N/A;  . COLONOSCOPY W/ BIOPSIES  AND POLYPECTOMY     "bx was ok"  . IR THORACENTESIS ASP PLEURAL SPACE W/IMG GUIDE  06/11/2017  . IR THORACENTESIS ASP PLEURAL SPACE W/IMG GUIDE  06/12/2017  . REPAIR OF ACUTE ASCENDING THORACIC AORTIC DISSECTION N/A 05/30/2017   Procedure: REPLACEMENT OF ASCENDING AORTIC ANEURYSM AND REPAIRED CHRONIC ROOT DISSECTION WITH CIRC ARREST;  Surgeon: Ivin Poot, MD;  Location: [facility];  Service: Vascular;  Laterality: N/A;  . TEE WITHOUT CARDIOVERSION N/A 05/28/2017   Procedure: TRANSESOPHAGEAL ECHOCARDIOGRAM (TEE);  Surgeon: Skeet Latch, MD;  Location: [facility];   Service: Cardiovascular;  Laterality: N/A;  . TEE WITHOUT CARDIOVERSION N/A 05/30/2017   Procedure: TRANSESOPHAGEAL ECHOCARDIOGRAM (TEE);  Surgeon: Prescott Gum, Collier Salina, MD;  Location: [facility];  Service: Open Heart Surgery;  Laterality: N/A;    Allergies Minoxidil and Naproxen  Family History  Problem Relation Age of Onset  . Diabetes Mother   . Colon polyps Mother   . Hypertension Mother   . Hypertension Brother   . Hypertension Sister   . Colon cancer Neg Hx     Social History Social History   Tobacco Use  . Smoking status: Former Smoker    Packs/day: 0.40    Years: 15.00    Pack years: 6.00    Types: Cigarettes    Last attempt to quit: 04/08/1982    Years since quitting: 36.2  . Smokeless tobacco: Never Used  Substance Use Topics  . Alcohol use: No  . Drug use: No    Review of Systems  Constitutional: No fever/chills Eyes: No visual changes. ENT: No sore throat. Cardiovascular: Denies chest pain. Respiratory: Positive shortness of breath and cough.  Gastrointestinal: No abdominal pain.  No nausea, no vomiting.  No diarrhea.  No constipation. Genitourinary: Negative for dysuria. Musculoskeletal: Negative for back pain. Skin: Negative for rash. Neurological: Negative for headaches, focal weakness or numbness.  10-point ROS otherwise negative.  ____________________________________________   PHYSICAL EXAM:  VITAL SIGNS: Vitals:   07/11/18 0945 07/11/18 0947  BP: 121/69   Pulse: 85   Resp: 16   SpO2: 98% 99%    Constitutional: Alert and oriented. Patient appears acutely SOB.  Eyes: Conjunctivae are normal. . Head: Atraumatic. Nose: No congestion/rhinnorhea. Mouth/Throat: Mucous membranes are moist.  Neck: No stridor.   Cardiovascular: Normal rate, regular rhythm. Good peripheral circulation. Grossly normal heart sounds.   Respiratory: Significantly increased respiratory effort. Positive retractions. Lungs without wheezing. Positive bilateral crackles.   Gastrointestinal:  No distention.  Musculoskeletal: No lower extremity tenderness with trace bilateral LE edema. No gross deformities of extremities. Neurologic:  Normal speech and language. No gross focal neurologic deficits are appreciated.  Skin:  Skin is warm, dry and intact. No rash noted.  ____________________________________________   LABS (all labs ordered are listed, but only abnormal results are displayed)  Labs Reviewed  COMPREHENSIVE METABOLIC PANEL - Abnormal; Notable for the following components:      Result Value   Glucose, Bld 226 (*)    BUN 50 (*)    Creatinine, Ser 2.44 (*)    GFR calc non Af Amer 19 (*)    GFR calc Af Amer 22 (*)    All other components within normal limits  TROPONIN I - Abnormal; Notable for the following components:   Troponin I 0.04 (*)    All other components within normal limits  CBC WITH DIFFERENTIAL/PLATELET - Abnormal; Notable for the following components:   WBC 16.1 (*)    RBC 3.51 (*)  Hemoglobin 9.9 (*)    HCT 33.0 (*)    RDW 17.2 (*)    Neutro Abs 13.7 (*)    Abs Immature Granulocytes 0.09 (*)    All other components within normal limits  URINALYSIS, ROUTINE W REFLEX MICROSCOPIC - Abnormal; Notable for the following components:   APPearance HAZY (*)    Hgb urine dipstick SMALL (*)    Protein, ur 100 (*)    Bacteria, UA RARE (*)    All other components within normal limits  BRAIN NATRIURETIC PEPTIDE - Abnormal; Notable for the following components:   B Natriuretic Peptide 2,661.4 (*)    All other components within normal limits  POCT I-STAT 7, (LYTES, BLD GAS, ICA,H+H) - Abnormal; Notable for the following components:   pH, Arterial 7.283 (*)    pCO2 arterial 71.5 (*)    pO2, Arterial 191.0 (*)    Bicarbonate 33.8 (*)    TCO2 36 (*)    Acid-Base Excess 6.0 (*)    Potassium 3.1 (*)    HCT 29.0 (*)    Hemoglobin 9.9 (*)    All other components within normal limits  NOVEL CORONAVIRUS, NAA (HOSPITAL ORDER, SEND-OUT TO  REF LAB)  I-STAT ARTERIAL BLOOD GAS, ED   ____________________________________________  EKG   EKG Interpretation  Date/Time:  Saturday July 11 2018 07:42:45 EDT Ventricular Rate:  122 PR Interval:    QRS Duration: 186 QT Interval:  439 QTC Calculation: 626 R Axis:   -66 Text Interpretation:  Sinus tachycardia Left bundle branch block No STEMI. Similar to prior.  Confirmed by Nanda Quinton [phone]) on 07/11/2018 8:19:00 AM Also confirmed by Nanda Quinton [phone]), editor Philomena Doheny [phone])  on 07/11/2018 8:24:28 AM       ____________________________________________  RADIOLOGY  Dg Chest Portable 1 View  Result Date: 07/11/2018 CLINICAL DATA:  Respiratory distress.  Patient intubated. EXAM: PORTABLE CHEST 1 VIEW COMPARISON:  09/03/2017 FINDINGS: There changes from cardiac surgery and valve replacement. Cardiac silhouette is mildly enlarged. No mediastinal or hilar masses. There is central vascular congestion. Hazy perihilar and lower lung zone airspace opacities are noted. There are small bilateral pleural effusions. No convincing pneumothorax. Endotracheal tube tip projects at the [place]. Nasal/orogastric tube passes well below the diaphragm into the stomach. IMPRESSION: 1. Findings consistent with congestive heart failure with perihilar and lower lung zone airspace pulmonary edema and small bilateral pleural effusions. 2. Endotracheal tube tip projects at the [place]. Consider retracting 1-2 cm for more optimal positioning. 3. Well-positioned nasal/orogastric tube. Electronically Signed   By: Lajean Manes M.D.   On: 07/11/2018 09:15    ____________________________________________   PROCEDURES  Procedure(s) performed:   .Critical Care Performed by: Margette Fast, MD Authorized by: Margette Fast, MD   Critical care provider statement:    Critical care time (minutes):  71   Critical care time was exclusive of:  Separately billable procedures and treating other patients and  teaching time   Critical care was necessary to treat or prevent imminent or life-threatening deterioration of the following conditions:  Respiratory failure   Critical care was time spent personally by me on the following activities:  Blood draw for specimens, development of treatment plan with patient or surrogate, discussions with consultants, evaluation of patient's response to treatment, examination of patient, obtaining history from patient or surrogate, ordering and performing treatments and interventions, ordering and review of laboratory studies, ordering and review of radiographic studies, pulse oximetry, re-evaluation of patient's condition, review of old charts  and ventilator management   I assumed direction of critical care for this patient from another provider in my specialty: no   Procedure Name: Intubation Date/Time: 07/11/2018 10:13 AM Performed by: Margette Fast, MD Pre-anesthesia Checklist: Patient identified, Emergency Drugs available, Suction available and Patient being monitored Oxygen Delivery Method: Non-rebreather mask Preoxygenation: Pre-oxygenation with 100% oxygen Induction Type: Rapid sequence Laryngoscope Size: Glidescope and 3 Grade View: Grade III Tube size: 7.5 mm Number of attempts: 1 Airway Equipment and Method: Video-laryngoscopy Placement Confirmation: ETT inserted through vocal cords under direct vision,  Positive ETCO2,  CO2 detector and Breath sounds checked- equal and bilateral Secured at: 26 cm Tube secured with: ETT holder Dental Injury: Teeth and Oropharynx as per pre-operative assessment       ____________________________________________   INITIAL IMPRESSION / ASSESSMENT AND PLAN / ED COURSE  Pertinent labs & imaging results that were available during my care of the patient were reviewed by me and considered in my medical decision making (see chart for details).   Patient presents to the emergency department with shortness of breath and  cough worsening over the past 3 days.  She has known history of CKD and diastolic CHF.  No reported fevers.  No wheezing on exam.  No known COVID exposure.  Patient does not appear acutely volume overloaded on my assessment.  No significant wheezing.  Gave MDI albuterol and maintained on nonrebreather at 100%.  Patient becoming increasingly short of breath with increased work of breathing and diaphoresis.  Given COVID as a consideration decided to defer BiPAP and moved for intubation.  Had discussion with the patient regarding this prior to intubation who is in agreement.  She confirms that she is a full code.   Patient deteriorating on NRB with increased WOB. Discussed with patient and decision made to intubate. Procedure performed as above without complication.   10:00 AM  Lab work and chest x-ray reviewed.  Possible pulmonary edema.  Also considering COVID.  Patient with leukocytosis.  No focal infiltrate on chest x-ray.  Patient interfacing well with vent and requiring increased sedation at this time.  Spoke with critical care who will be down to admit.  They request that I order COVID test, which was done.   Discussed patient's case with Critical Care to request admission. Patient and family (if present) updated with plan. Care transferred to ICU service.  I reviewed all nursing notes, vitals, pertinent old records, EKGs, labs, imaging (as available).  Chalise Pe was evaluated in Emergency Department on 07/11/2018 for the symptoms described in the history of present illness. She was evaluated in the context of the global COVID-19 pandemic, which necessitated consideration that the patient might be at risk for infection with the SARS-CoV-2 virus that causes COVID-19. Institutional protocols and algorithms that pertain to the evaluation of patients at risk for COVID-19 are in a state of rapid change based on information released by regulatory bodies including the CDC and federal and state  organizations. These policies and algorithms were followed during the patient's care in the ED.  ____________________________________________  FINAL CLINICAL IMPRESSION(S) / ED DIAGNOSES  Final diagnoses:  SOB (shortness of breath)  Hypoxia    MEDICATIONS GIVEN DURING THIS VISIT:  Medications  propofol (DIPRIVAN) 1000 MG/100ML infusion (has no administration in time range)  furosemide (LASIX) injection 60 mg (has no administration in time range)  fentaNYL (SUBLIMAZE) injection 100 mcg (has no administration in time range)  albuterol (PROVENTIL HFA;VENTOLIN HFA) 108 (90 Base) MCG/ACT inhaler 6  puff (6 puffs Inhalation Given 07/11/18 0740)  etomidate (AMIDATE) injection 20 mg (20 mg Intravenous Given 07/11/18 0800)  rocuronium (ZEMURON) injection 50 mg (50 mg Intravenous Given 07/11/18 0801)    Note:  This document was prepared using Dragon voice recognition software and may include unintentional dictation errors.  Nanda Quinton, MD Emergency Medicine    Long, [facility], MD 07/11/18 1014

## 2018-07-11 NOTE — H&P (Addendum)
NAME:  [name], MRN:  [id number], DOB:  Nov 08, 1945, LOS: 0 ADMISSION DATE:  07/11/2018, CONSULTATION DATE: 07/11/2018 REFERRING MD: Jerelyn [facility] ED, CHIEF COMPLAINT: Respiratory failure  HPI/course in hospital  73 year old woman with a history of diastolic heart failure and CKD. Presents with 3-day worsening shortness of breath without chest pain.  No history of fever.  Associated cough. Brought in by EMS for respiratory distress. No sick contacts.  Past Medical History   Past Medical History:  Diagnosis Date  . Acute pancreatitis 02/04/2017   [name] 02/04/2017  . Adenomatous colon polyp 01/2002  . Anemia, chronic disease    /notes 02/04/2017  . Aortic valve disease    AI/AS  . Arthritis    "lower back, knees" (02/04/2017)  . CHF (congestive heart failure) ([place])   . CKD (chronic kidney disease), stage III ([place])   . Diverticulosis of colon   . DJD (degenerative joint disease)   . Frequent UTI   . Hemorrhoids   . Hypertension   . Lumbar back pain   . Neuropathy   . Obesity   . Pericardial effusion     in a patient with Diastolic heart  failure and Pericardial effusion  known since last 2 D echo in 11/2016 /notes 02/04/2017  . PONV (postoperative nausea and vomiting)   . Pulmonary HTN ([place])   . Renal cyst   . Venous insufficiency   . Vitamin D deficiency      Past Surgical History:  Procedure Laterality Date  . ABDOMINAL HYSTERECTOMY     "partial"  . AORTIC VALVE REPLACEMENT N/A 05/30/2017   Procedure: AORTIC VALVE REPLACEMENT (AVR);  Surgeon: Prescott Gum, Collier Salina, MD;  Location: [facility];  Service: Vascular;  Laterality: N/A;  . COLONOSCOPY W/ BIOPSIES AND POLYPECTOMY     "bx was ok"  . IR THORACENTESIS ASP PLEURAL SPACE W/IMG GUIDE  06/11/2017  . IR THORACENTESIS ASP PLEURAL SPACE W/IMG GUIDE  06/12/2017  . REPAIR OF ACUTE ASCENDING THORACIC AORTIC DISSECTION N/A 05/30/2017   Procedure: REPLACEMENT OF ASCENDING AORTIC ANEURYSM AND REPAIRED CHRONIC ROOT DISSECTION WITH CIRC ARREST;   Surgeon: Ivin Poot, MD;  Location: [facility];  Service: Vascular;  Laterality: N/A;  . TEE WITHOUT CARDIOVERSION N/A 05/28/2017   Procedure: TRANSESOPHAGEAL ECHOCARDIOGRAM (TEE);  Surgeon: Skeet Latch, MD;  Location: [facility];  Service: Cardiovascular;  Laterality: N/A;  . TEE WITHOUT CARDIOVERSION N/A 05/30/2017   Procedure: TRANSESOPHAGEAL ECHOCARDIOGRAM (TEE);  Surgeon: Prescott Gum, Collier Salina, MD;  Location: [facility];  Service: Open Heart Surgery;  Laterality: N/A;     Review of Systems:   Review of Systems  Unable to perform ROS: Intubated    Social History   reports that she quit smoking about 36 years ago. Her smoking use included cigarettes. She has a 6.00 pack-year smoking history. She has never used smokeless tobacco. She reports that she does not drink alcohol or use drugs.   Family History   Her family history includes Colon polyps in her mother; Diabetes in her mother; Hypertension in her brother, mother, and sister. There is no history of Colon cancer.   Allergies Allergies  Allergen Reactions  . Minoxidil Palpitations and Other (See Comments)    unable to sleep  . Naproxen Swelling     Home Medications  Prior to Admission medications   Medication Sig Start Date End Date Taking? Authorizing Provider  amLODipine (NORVASC) 5 MG tablet Take 5 mg by mouth at bedtime. 05/07/18  Yes [provider]  aspirin EC  81 MG tablet Take 81 mg by mouth daily.   Yes [provider]  calcitRIOL (ROCALTROL) 0.25 MCG capsule Take 1 capsule (0.25 mcg total) by mouth every other day. Please take every Monday, Wednesday, Friday 06/16/17  Yes Tacy Dura, Donielle M, PA-C  calcium carbonate (TUMS - DOSED IN MG ELEMENTAL CALCIUM) 500 MG chewable tablet Chew 2 tablets by mouth daily as needed for indigestion or heartburn.   Yes [provider]  carvedilol (COREG) 25 MG tablet TAKE 1/2 TABLET(12.5 MG) BY MOUTH TWICE DAILY WITH A MEAL Patient taking differently: Take 25 mg  by mouth 2 (two) times daily with a meal.  09/23/17  Yes Minus Breeding, MD  FAMOTIDINE PO Take 1 tablet by mouth 2 (two) times daily as needed (gas).   Yes [provider]  fluticasone (FLONASE) 50 MCG/ACT nasal spray Place 2 sprays daily into both nostrils. Patient taking differently: Place 2 sprays into both nostrils daily as needed for allergies.  02/12/17  Yes Debbe Odea, MD  furosemide (LASIX) 80 MG tablet Take 160 mg by mouth 2 (two) times daily.   Yes [provider]  gabapentin (NEURONTIN) 300 MG capsule TAKE 1 CAPSULE(300 MG) BY MOUTH THREE TIMES DAILY AS NEEDED FOR PAIN Patient taking differently: 300 mg 3 (three) times daily as needed (pain).  05/22/18  Yes Hoyt Koch, MD  ondansetron (ZOFRAN) 4 MG tablet Take 1 tablet (4 mg total) by mouth every 8 (eight) hours as needed for nausea or vomiting. 05/22/18  Yes Hoyt Koch, MD  potassium chloride SA (K-DUR,KLOR-CON) 20 MEQ tablet Take 20 mEq by mouth daily.    Yes [provider]  Propylene Glycol (SYSTANE BALANCE) 0.6 % SOLN Place 1 drop into both eyes daily as needed (for dry eyes).   Yes [provider]  traMADol (ULTRAM) 50 MG tablet Take 1 tablet (50 mg total) by mouth every 12 (twelve) hours as needed for severe pain. 06/16/17  Yes Lars Pinks M, PA-C  baclofen (LIORESAL) 10 MG tablet Take 0.5 tablets (5 mg total) by mouth 3 (three) times daily as needed for muscle spasms. Patient not taking: Reported on 07/11/2018 03/09/18   Rosemarie Ax, MD  cyclobenzaprine (FLEXERIL) 5 MG tablet Take 1 tablet (5 mg total) by mouth 3 (three) times daily as needed for muscle spasms. Patient not taking: Reported on 07/11/2018 05/22/18   Hoyt Koch, MD  Diclofenac Sodium (PENNSAID) 2 % SOLN Place 1 application onto the skin 2 (two) times daily. Patient not taking: Reported on 07/11/2018 02/11/18   Rosemarie Ax, MD  hydrocortisone (ANUSOL-HC) 2.5 % rectal cream Place 1  application rectally as needed. Patient not taking: Reported on 07/11/2018 05/24/13   Noralee Space, MD  lidocaine (LIDODERM) 5 % Place 1 patch onto the skin daily. Remove & Discard patch within 12 hours or as directed by MD Patient not taking: Reported on 07/11/2018 08/19/17   Hoyt Koch, MD  pantoprazole (PROTONIX) 40 MG tablet Take 1 tablet (40 mg total) daily by mouth. Patient not taking: Reported on 07/11/2018 02/12/17   Debbe Odea, MD  triamcinolone ointment (KENALOG) 0.5 % Apply 1 application topically daily as needed (itching). Patient not taking: Reported on 07/11/2018 06/16/17   Nani Skillern, PA-C     Interim history/subjective:  Intubated in ED.   Objective   Blood pressure 119/66, pulse 63, resp. rate 12, height 5\' 1"  (1.549 m), weight 76.2 kg, SpO2 100 %.    Vent Mode:  PRVC FiO2 (%):  [50 %-100 %] 50 % Set Rate:  [14 bmp-16 bmp] 16 bmp Vt Set:  [400 mL] 400 mL PEEP:  [5 cmH20-8 cmH20] 8 cmH20 Plateau Pressure:  [16 cmH20] 16 cmH20   Intake/Output Summary (Last 24 hours) at 07/11/2018 1215 Last data filed at 07/11/2018 1053 Gross per 24 hour  Intake 500 ml  Output 100 ml  Net 400 ml   Filed Weights   07/11/18 0815  Weight: 76.2 kg    Examination: Physical Exam Constitutional:      General: She is not in acute distress.    Appearance: She is ill-appearing.     Comments: Moderately obese woman.  HENT:     Head: Normocephalic and atraumatic.  Neck:     Vascular: JVD present.  Cardiovascular:     Rate and Rhythm: Normal rate and regular rhythm.     Heart sounds: Murmur present. Gallop present.      Comments: Left ventricular heave.  2/6 holosystolic murmur.  S3 present. Pulmonary:     Effort: No respiratory distress.     Breath sounds: Examination of the right-lower field reveals decreased breath sounds. Examination of the left-lower field reveals decreased breath sounds. Decreased breath sounds present. No wheezing or rales.     Comments: No  ventilator asynchrony.  Acceptable plateau pressure Chest:     Comments: Prior sternotomy incision.  Abdominal:     Palpations: Abdomen is soft.  Skin:    General: Skin is warm and dry.     Capillary Refill: Capillary refill takes 2 to 3 seconds.  Neurological:     General: No focal deficit present.     Comments: Sedated on propofol.  Opens eyes to voice.      Ancillary tests (personally reviewed)  CBC: Recent Labs  Lab 07/11/18 0751 07/11/18 0925  WBC 16.1*  --   NEUTROABS 13.7*  --   HGB 9.9* 9.9*  HCT 33.0* 29.0*  MCV 94.0  --   PLT 267  --     Basic Metabolic Panel: Recent Labs  Lab 07/11/18 0751 07/11/18 0925  NA 140 143  K 3.5 3.1*  CL 101  --   CO2 25  --   GLUCOSE 226*  --   BUN 50*  --   CREATININE 2.44*  --   CALCIUM 9.7  --    GFR: Estimated Creatinine Clearance: 19.5 mL/min (A) (by C-G formula based on SCr of 2.44 mg/dL (H)). Recent Labs  Lab 07/11/18 0751  WBC 16.1*    Liver Function Tests: Recent Labs  Lab 07/11/18 0751  AST 26  ALT 16  ALKPHOS 79  BILITOT 0.9  PROT 7.6  ALBUMIN 3.7   No results for input(s): LIPASE, AMYLASE in the last 168 hours. No results for input(s): AMMONIA in the last 168 hours.  ABG    Component Value Date/Time   PHART 7.283 (L) 07/11/2018 0925   PCO2ART 71.5 (HH) 07/11/2018 0925   PO2ART 191.0 (H) 07/11/2018 0925   HCO3 33.8 (H) 07/11/2018 0925   TCO2 36 (H) 07/11/2018 0925   ACIDBASEDEF 2.0 05/31/2017 1255   O2SAT 99.0 07/11/2018 0925     Coagulation Profile: No results for input(s): INR, PROTIME in the last 168 hours.  Cardiac Enzymes: Recent Labs  Lab 07/11/18 0751  TROPONINI 0.04*    HbA1C: Hgb A1c MFr Bld  Date/Time Value Ref Range Status  05/30/2017 02:54 AM 5.3 4.8 - 5.6 % Final    Comment:    (  NOTE) Pre diabetes:          5.7%-6.4% Diabetes:              >6.4% Glycemic control for   <7.0% adults with diabetes   02/04/2017 09:01 PM 5.5 4.8 - 5.6 % Final    Comment:     (NOTE) Pre diabetes:          5.7%-6.4% Diabetes:              >6.4% Glycemic control for   <7.0% adults with diabetes    No recent echocardiogram  CBG: No results for input(s): GLUCAP in the last 168 hours.  CXR: Personally reviewed.  Poor quality film.  Endotracheal tube appears in adequate position.  Increased bilateral interstitial infiltrates. Assessment & Plan:  Critically ill due to acute hypoxic hypercapnic respiratory failure requiring mechanical ventilation  Acute decompensated heart failure given elevated BNP. Present systolic/diastolic function unknown. Low probability of novel coronavirus infection. Acute on chronic stage IV kidney disease Essential hypertension Status post AVR and aortic dissection.  Plan:  Full ventilator support with lung protective strategy Hyperventilated initially to clear respiratory acidosis Resume home diuretic regimen IV and optimize dosing based on urine output. Continue to titrate propofol for patient comfort Proceed to daily weaning protocol as of tomorrow. Hold home antihypertensive medication. Rule out concurrent respiratory infection via rapid flu, respiratory viral panel, coronavirus screening. Reassess cardiac function by repeat echocardiogram.   Best practice:  Diet: initiate tube feeds Pain/Anxiety/Delirium protocol (if indicated): Propofol infusion and PRN fentanyl for RASS 0 to -1 VAP protocol (if indicated): Bundle in place DVT prophylaxis: Unfractionated heparin GI prophylaxis: Famotidine Urinary catheter: Assessment of intravascular volume Glucose control: Phase 1 Mobility: Bedrest Code Status: Full code Family Communication:  Tried to call husband, no answer. Disposition: Admit to ICU   Critical care time: 50 min including chart data review, examination of patient, multidisciplinary rounds, and frequent assessment and modification of ventilator therapy and coordination of care.     Kipp Brood, MD [facility] ICU  Physician [facility]  Pager: [phone] Mobile: [phone] After hours: [phone].  07/11/2018, 12:15 PM

## 2018-07-12 DIAGNOSIS — Z9911 Dependence on respirator [ventilator] status: Secondary | ICD-10-CM

## 2018-07-12 DIAGNOSIS — E8770 Fluid overload, unspecified: Secondary | ICD-10-CM

## 2018-07-12 DIAGNOSIS — I509 Heart failure, unspecified: Secondary | ICD-10-CM

## 2018-07-12 LAB — RESPIRATORY PANEL BY PCR

## 2018-07-12 LAB — BASIC METABOLIC PANEL
Anion gap: 11 (ref 5–15)
BUN: 52 mg/dL — ABNORMAL HIGH (ref 8–23)
CO2: 29 mmol/L (ref 22–32)
Calcium: 9.6 mg/dL (ref 8.9–10.3)
Chloride: 101 mmol/L (ref 98–111)
Creatinine, Ser: 2.33 mg/dL — ABNORMAL HIGH (ref 0.44–1.00)
GFR calc Af Amer: 23 mL/min — ABNORMAL LOW (ref >60)
GFR calc non Af Amer: 20 mL/min — ABNORMAL LOW (ref >60)
Glucose, Bld: 99 mg/dL (ref 70–99)
Potassium: 3 mmol/L — ABNORMAL LOW (ref 3.5–5.1)
Sodium: 141 mmol/L (ref 135–145)

## 2018-07-12 LAB — POCT I-STAT 7, (LYTES, BLD GAS, ICA,H+H)
Acid-Base Excess: 9 mmol/L — ABNORMAL HIGH (ref 0.0–2.0)
Acid-Base Excess: 7 mmol/L — ABNORMAL HIGH (ref 0.0–2.0)
Bicarbonate: 31.5 mmol/L — ABNORMAL HIGH (ref 20.0–28.0)
Bicarbonate: 29.2 mmol/L — ABNORMAL HIGH (ref 20.0–28.0)
Calcium, Ion: 1.22 mmol/L (ref 1.15–1.40)
Calcium, Ion: 1.24 mmol/L (ref 1.15–1.40)
HCT: 30 % — ABNORMAL LOW (ref 36.0–46.0)
HCT: 28 % — ABNORMAL LOW (ref 36.0–46.0)
Hemoglobin: 10.2 g/dL — ABNORMAL LOW (ref 12.0–15.0)
Hemoglobin: 9.5 g/dL — ABNORMAL LOW (ref 12.0–15.0)
O2 Saturation: 99 %
O2 Saturation: 98 %
Patient temperature: 97.5
Patient temperature: 97.5
Potassium: 2.8 mmol/L — ABNORMAL LOW (ref 3.5–5.1)
Potassium: 2.8 mmol/L — ABNORMAL LOW (ref 3.5–5.1)
Sodium: 143 mmol/L (ref 135–145)
Sodium: 142 mmol/L (ref 135–145)
TCO2: 33 mmol/L — ABNORMAL HIGH (ref 22–32)
TCO2: 30 mmol/L (ref 22–32)
pCO2 arterial: 33.7 mmHg (ref 32.0–48.0)
pCO2 arterial: 30.9 mmHg — ABNORMAL LOW (ref 32.0–48.0)
pH, Arterial: 7.578 — ABNORMAL HIGH (ref 7.350–7.450)
pH, Arterial: 7.581 — ABNORMAL HIGH (ref 7.350–7.450)
pO2, Arterial: 104 mmHg (ref 83.0–108.0)
pO2, Arterial: 79 mmHg — ABNORMAL LOW (ref 83.0–108.0)

## 2018-07-12 LAB — GLUCOSE, CAPILLARY
Glucose-Capillary: 95 mg/dL (ref 70–99)
Glucose-Capillary: 80 mg/dL (ref 70–99)
Glucose-Capillary: 98 mg/dL (ref 70–99)
Glucose-Capillary: 81 mg/dL (ref 70–99)
Glucose-Capillary: 78 mg/dL (ref 70–99)
Glucose-Capillary: 94 mg/dL (ref 70–99)
Glucose-Capillary: 97 mg/dL (ref 70–99)

## 2018-07-12 LAB — PHOSPHORUS: Phosphorus: 3.7 mg/dL (ref 2.5–4.6)

## 2018-07-12 LAB — CBC
HCT: 30.6 % — ABNORMAL LOW (ref 36.0–46.0)
Hemoglobin: 9.7 g/dL — ABNORMAL LOW (ref 12.0–15.0)
MCH: 29 pg (ref 26.0–34.0)
MCHC: 31.7 g/dL (ref 30.0–36.0)
MCV: 91.3 fL (ref 80.0–100.0)
Platelets: 172 10*3/uL (ref 150–400)
RBC: 3.35 MIL/uL — ABNORMAL LOW (ref 3.87–5.11)
RDW: 17.8 % — ABNORMAL HIGH (ref 11.5–15.5)
WBC: 6.8 10*3/uL (ref 4.0–10.5)
nRBC: 0 % (ref 0.0–0.2)

## 2018-07-12 LAB — MAGNESIUM: Magnesium: 2.3 mg/dL (ref 1.7–2.4)

## 2018-07-12 MED ORDER — ENOXAPARIN SODIUM 30 MG/0.3ML ~~LOC~~ SOLN
30.0000 mg | SUBCUTANEOUS | Status: DC
  Administered 2018-07-12: 30 mg via SUBCUTANEOUS
  Filled 2018-07-12 (×2): qty 0.3

## 2018-07-12 MED ORDER — POTASSIUM CHLORIDE 20 MEQ/15ML (10%) PO SOLN
40.0000 meq | Freq: Two times a day (BID) | ORAL | Status: AC
  Administered 2018-07-12 – 2018-07-13 (×3): 40 meq via ORAL
  Filled 2018-07-12 (×3): qty 30

## 2018-07-12 MED ORDER — CARVEDILOL 12.5 MG PO TABS
12.5000 mg | ORAL_TABLET | Freq: Two times a day (BID) | ORAL | Status: DC
  Administered 2018-07-12 – 2018-07-15 (×7): 12.5 mg via ORAL
  Filled 2018-07-12 (×9): qty 1

## 2018-07-12 MED ORDER — POTASSIUM CHLORIDE 20 MEQ PO PACK
40.0000 meq | PACK | Freq: Two times a day (BID) | ORAL | Status: DC
  Administered 2018-07-12: 40 meq
  Filled 2018-07-12 (×2): qty 2

## 2018-07-12 MED ORDER — ORAL CARE MOUTH RINSE
15.0000 mL | Freq: Two times a day (BID) | OROMUCOSAL | Status: DC
  Administered 2018-07-12 – 2018-07-15 (×6): 15 mL via OROMUCOSAL

## 2018-07-12 NOTE — Progress Notes (Signed)
Pt placed on wean by CCM.

## 2018-07-12 NOTE — Progress Notes (Signed)
NAME:  [name], MRN:  [id number], DOB:  1945-05-17, LOS: 1 ADMISSION DATE:  07/11/2018, CONSULTATION DATE:  07/11/2018 REFERRING MD:  ED, CHIEF COMPLAINT:  intubated   Brief History   73 yo FM, AHRF, +BNP, h/o Diastolic HF. +cough, no fevers. COVID r/o  History of present illness   73 year old woman with a history of diastolic heart failure and CKD. Presents with 3-day worsening shortness of breath without chest pain.  No history of fever.  Associated cough. Brought in by EMS for respiratory distress. No sick contacts.  Past Medical History   Past Medical History:  Diagnosis Date  . Acute pancreatitis 02/04/2017   [name] 02/04/2017  . Adenomatous colon polyp 01/2002  . Anemia, chronic disease    /notes 02/04/2017  . Aortic valve disease    AI/AS  . Arthritis    "lower back, knees" (02/04/2017)  . CHF (congestive heart failure) ([place])   . CKD (chronic kidney disease), stage III ([place])   . Diverticulosis of colon   . DJD (degenerative joint disease)   . Frequent UTI   . Hemorrhoids   . Hypertension   . Lumbar back pain   . Neuropathy   . Obesity   . Pericardial effusion     in a patient with Diastolic heart  failure and Pericardial effusion  known since last 2 D echo in 11/2016 /notes 02/04/2017  . PONV (postoperative nausea and vomiting)   . Pulmonary HTN ([place])   . Renal cyst   . Venous insufficiency   . Vitamin D deficiency      Significant Hospital Events     Consults:  PCCM   Procedures:  4/4: ETT   Significant Diagnostic Tests:  BNP 2600  Micro Data:  COVID- pending  MRSA PCR neg RVP - pending   Antimicrobials:    Interim history/subjective:  Intubated on mechanical ventilation   Objective   Blood pressure (!) 146/83, pulse 64, temperature (!) 97.5 F (36.4 C), temperature source Oral, resp. rate (!) 25, height 5\' 1"  (1.549 m), weight 77.4 kg, SpO2 100 %.    Vent Mode: PRVC FiO2 (%):  [40 %] 40 % Set Rate:  [25 bmp] 25 bmp Vt Set:  [400 mL]  400 mL PEEP:  [8 cmH20] 8 cmH20 Plateau Pressure:  [16 cmH20-22 cmH20] 22 cmH20   Intake/Output Summary (Last 24 hours) at 07/12/2018 0953 Last data filed at 07/12/2018 0600 Gross per 24 hour  Intake 1294.77 ml  Output 1540 ml  Net -245.23 ml   Filed Weights   07/11/18 0815 07/12/18 0315  Weight: 76.2 kg 77.4 kg    Examination: General: elderly fm, doing well, intubated on mech vent  HENT: NCAT sclera clear  Lungs: BL vented breath sounds  Cardiovascular: RRR, S1 s2, no MRG  Abdomen: soft, nt nd  Extremities: no edema  Neuro: alert oriented following commands  Resolved Hospital Problem list     Assessment & Plan:   Acute hypoxemic respiratory failure  Intubated on mechanical ventilation, secondary to Acute on Chronic Diastolic Heart Failure and volume overload. Admitted to ICU intubated for COVID r/o, low probability Respiratory and metabolic alkalosis  - covid testing pending  - on vent protocol  - sedation on propofol  - wean fio2 as tolerated  - maintain sats >100%  - repeat ABG and will make appropriate vent changes  - vent management protocol, LTVV strategy  - with additional diuresis may consider extubation   Acute on chronic stage IV kidney disease  -  will continue with diuresis  - will continue to follow lyte and scr with BMET   Essential HTN  - we will restart coreg   S/p AVR, h/o Aortic dissection    Best practice:  Diet: tube feeding  Pain/Anxiety/Delirium protocol (if indicated): yes  VAP protocol (if indicated): yes DVT prophylaxis: yes  GI prophylaxis: ppi Glucose control: SSI  Mobility: BR Code Status: FULL  Family Communication:  Disposition: ICU   Labs   CBC: Recent Labs  Lab 07/11/18 0751 07/11/18 0925 07/11/18 1240 07/11/18 1653 07/12/18 0419 07/12/18 0441  WBC 16.1*  --   --  10.4 6.8  --   NEUTROABS 13.7*  --   --   --   --   --   HGB 9.9* 9.9* 8.8* 8.5* 9.7* 10.2*  HCT 33.0* 29.0* 26.0* 27.8* 30.6* 30.0*  MCV 94.0  --    --  91.1 91.3  --   PLT 267  --   --  186 172  --     Basic Metabolic Panel: Recent Labs  Lab 07/11/18 0751 07/11/18 0925 07/11/18 1240 07/11/18 1653 07/12/18 0419 07/12/18 0441  NA 140 143 144  --  141 143  K 3.5 3.1* 3.2*  --  3.0* 2.8*  CL 101  --   --   --  101  --   CO2 25  --   --   --  29  --   GLUCOSE 226*  --   --   --  99  --   BUN 50*  --   --   --  52*  --   CREATININE 2.44*  --   --  2.44* 2.33*  --   CALCIUM 9.7  --   --   --  9.6  --   MG  --   --   --   --  2.3  --   PHOS  --   --   --   --  3.7  --    GFR: Estimated Creatinine Clearance: 20.5 mL/min (A) (by C-G formula based on SCr of 2.33 mg/dL (H)). Recent Labs  Lab 07/11/18 0751 07/11/18 1653 07/12/18 0419  WBC 16.1* 10.4 6.8    Liver Function Tests: Recent Labs  Lab 07/11/18 0751  AST 26  ALT 16  ALKPHOS 79  BILITOT 0.9  PROT 7.6  ALBUMIN 3.7   No results for input(s): LIPASE, AMYLASE in the last 168 hours. No results for input(s): AMMONIA in the last 168 hours.  ABG    Component Value Date/Time   PHART 7.578 (H) 07/12/2018 0441   PCO2ART 33.7 07/12/2018 0441   PO2ART 104.0 07/12/2018 0441   HCO3 31.5 (H) 07/12/2018 0441   TCO2 33 (H) 07/12/2018 0441   ACIDBASEDEF 2.0 05/31/2017 1255   O2SAT 99.0 07/12/2018 0441     Coagulation Profile: No results for input(s): INR, PROTIME in the last 168 hours.  Cardiac Enzymes: Recent Labs  Lab 07/11/18 0751  TROPONINI 0.04*    HbA1C: Hgb A1c MFr Bld  Date/Time Value Ref Range Status  05/30/2017 02:54 AM 5.3 4.8 - 5.6 % Final    Comment:    (NOTE) Pre diabetes:          5.7%-6.4% Diabetes:              >6.4% Glycemic control for   <7.0% adults with diabetes   02/04/2017 09:01 PM 5.5 4.8 - 5.6 % Final    Comment:    (  NOTE) Pre diabetes:          5.7%-6.4% Diabetes:              >6.4% Glycemic control for   <7.0% adults with diabetes     CBG: Recent Labs  Lab 07/11/18 1603 07/11/18 2009 07/12/18 0026 07/12/18 0453  07/12/18 0852  GLUCAP 91 97 95 80 98    This patient is critically ill with multiple organ system failure; which, requires frequent high complexity decision making, assessment, support, evaluation, and titration of therapies. This was completed through the application of advanced monitoring technologies and extensive interpretation of multiple databases. During this encounter critical care time was devoted to patient care services described in this note for 35 minutes.   Garner Nash, DO [facility] 07/12/2018 9:53 AM  Personal pager: [phone] If unanswered, please page CCM On-call: [phone]

## 2018-07-12 NOTE — Procedures (Signed)
Extubation Procedure Note  Patient Details:   Name: [name] DOB: Nov 28, 1945 MRN: [id number]   Airway Documentation:    Vent end date: 07/12/18 Vent end time: 1316   Evaluation  O2 sats: stable throughout Complications: No apparent complications Patient did tolerate procedure well. Bilateral Breath Sounds: Diminished   Yes   RT extubated patient to 2L [place] with RN at bedside per MD order. Positive cuff leak noted. Patient tolerated well and can speak. Patient says breathing is comfortable. RT will continue to monitor as needed.   Vernona Rieger 07/12/2018, 1:24 PM

## 2018-07-13 DIAGNOSIS — I5033 Acute on chronic diastolic (congestive) heart failure: Secondary | ICD-10-CM

## 2018-07-13 LAB — BASIC METABOLIC PANEL
Anion gap: 11 (ref 5–15)
BUN: 52 mg/dL — ABNORMAL HIGH (ref 8–23)
CO2: 28 mmol/L (ref 22–32)
Calcium: 9.4 mg/dL (ref 8.9–10.3)
Chloride: 104 mmol/L (ref 98–111)
Creatinine, Ser: 2.52 mg/dL — ABNORMAL HIGH (ref 0.44–1.00)
GFR calc Af Amer: 21 mL/min — ABNORMAL LOW (ref >60)
GFR calc non Af Amer: 18 mL/min — ABNORMAL LOW (ref >60)
Glucose, Bld: 124 mg/dL — ABNORMAL HIGH (ref 70–99)
Potassium: 4 mmol/L (ref 3.5–5.1)
Sodium: 143 mmol/L (ref 135–145)

## 2018-07-13 LAB — GLUCOSE, CAPILLARY
Glucose-Capillary: 97 mg/dL (ref 70–99)
Glucose-Capillary: 108 mg/dL — ABNORMAL HIGH (ref 70–99)
Glucose-Capillary: 93 mg/dL (ref 70–99)
Glucose-Capillary: 128 mg/dL — ABNORMAL HIGH (ref 70–99)

## 2018-07-13 LAB — NOVEL CORONAVIRUS, NAA (HOSP ORDER, SEND-OUT TO REF LAB; TAT 18-24 HRS): SARS-CoV-2, NAA: NOT DETECTED

## 2018-07-13 LAB — MAGNESIUM: Magnesium: 2.1 mg/dL (ref 1.7–2.4)

## 2018-07-13 MED ORDER — HEPARIN SODIUM (PORCINE) 5000 UNIT/ML IJ SOLN
5000.0000 [IU] | Freq: Three times a day (TID) | INTRAMUSCULAR | Status: DC
  Administered 2018-07-13 – 2018-07-16 (×9): 5000 [IU] via SUBCUTANEOUS
  Filled 2018-07-13 (×8): qty 1

## 2018-07-13 NOTE — Progress Notes (Addendum)
PCCM:  COVID testing NEGATIVE  Transfer from ICU Off isolation precautions   I have spoke with [facility] who has agreed to pick up the patient tomorrow per Dr. Thereasa Solo.   Garner Nash, DO [facility] 07/13/2018 9:52 AM

## 2018-07-13 NOTE — Progress Notes (Signed)
NAME:  [name], MRN:  [id number], DOB:  11-Apr-1945, LOS: 2 ADMISSION DATE:  07/11/2018, CONSULTATION DATE:  07/11/2018 REFERRING MD:  ED, CHIEF COMPLAINT:  intubated   Brief History   73 yo FM, AHRF, +BNP, h/o Diastolic HF. +cough, no fevers. COVID r/o  History of present illness   73 year old woman with a history of diastolic heart failure and CKD. Presents with 3-day worsening shortness of breath without chest pain.  No history of fever.  Associated cough. Brought in by EMS for respiratory distress. No sick contacts.  Past Medical History   Past Medical History:  Diagnosis Date  . Acute pancreatitis 02/04/2017   [name] 02/04/2017  . Adenomatous colon polyp 01/2002  . Anemia, chronic disease    /notes 02/04/2017  . Aortic valve disease    AI/AS  . Arthritis    "lower back, knees" (02/04/2017)  . CHF (congestive heart failure) ([place])   . CKD (chronic kidney disease), stage III ([place])   . Diverticulosis of colon   . DJD (degenerative joint disease)   . Frequent UTI   . Hemorrhoids   . Hypertension   . Lumbar back pain   . Neuropathy   . Obesity   . Pericardial effusion     in a patient with Diastolic heart  failure and Pericardial effusion  known since last 2 D echo in 11/2016 /notes 02/04/2017  . PONV (postoperative nausea and vomiting)   . Pulmonary HTN ([place])   . Renal cyst   . Venous insufficiency   . Vitamin D deficiency      Significant Hospital Events     Consults:  PCCM   Procedures:  4/4: ETT   Significant Diagnostic Tests:  BNP 2600  Micro Data:  COVID- pending  MRSA PCR neg RVP - pending   Antimicrobials:    Interim history/subjective:  Alert, comfortable. No complaints, states she is feeling better;.   Objective   Blood pressure 112/64, pulse 82, temperature 99 F (37.2 C), temperature source Oral, resp. rate 18, height 5\' 1"  (1.549 m), weight 75.9 kg, SpO2 100 %.    Vent Mode: PSV;CPAP FiO2 (%):  [21 %-40 %] 21 % Set Rate:  [18 bmp] 18  bmp Vt Set:  [330 mL] 330 mL PEEP:  [5 cmH20-8 cmH20] 5 cmH20 Pressure Support:  [5 cmH20] 5 cmH20 Plateau Pressure:  [17 cmH20] 17 cmH20   Intake/Output Summary (Last 24 hours) at 07/13/2018 0946 Last data filed at 07/13/2018 0800 Gross per 24 hour  Intake 47 ml  Output 2550 ml  Net -2503 ml   Filed Weights   07/11/18 0815 07/12/18 0315 07/13/18 0500  Weight: 76.2 kg 77.4 kg 75.9 kg    Examination: General: elderly fm, resting comfort, NAD  HENT: NCAT sclera clear  Lungs: CTAB, no wheeze  Cardiovascular: RRR, s1 s2  Abdomen: soft nt nd  Extremities: no edema  Neuro: alert, following commands, no deficit   Resolved Hospital Problem list     Assessment & Plan:   Acute hypoxemic respiratory failure, extubated  Acute on Chronic Diastolic Heart Failure and volume overload. Respiratory and metabolic alkalosis  COVID Negative - improved with diuresis  - repeat BMP and mag today  - stable for transfer from the ICU - discussed with Dr. Thereasa Solo   Acute on chronic stage IV kidney disease  - attemtp euvolemia   Essential HTN  - home coreg   S/p AVR, h/o Aortic dissection    Best practice:  Diet: tube feeding  Pain/Anxiety/Delirium protocol (if indicated): yes  VAP protocol (if indicated): yes DVT prophylaxis: yes  GI prophylaxis: ppi Glucose control: SSI  Mobility: BR Code Status: FULL  Family Communication:  Disposition: ICU   Labs   CBC: Recent Labs  Lab 07/11/18 0751  07/11/18 1240 07/11/18 1653 07/12/18 0419 07/12/18 0441 07/12/18 1017  WBC 16.1*  --   --  10.4 6.8  --   --   NEUTROABS 13.7*  --   --   --   --   --   --   HGB 9.9*   < > 8.8* 8.5* 9.7* 10.2* 9.5*  HCT 33.0*   < > 26.0* 27.8* 30.6* 30.0* 28.0*  MCV 94.0  --   --  91.1 91.3  --   --   PLT 267  --   --  186 172  --   --    < > = values in this interval not displayed.    Basic Metabolic Panel: Recent Labs  Lab 07/11/18 0751 07/11/18 0925 07/11/18 1240 07/11/18 1653 07/12/18 0419  07/12/18 0441 07/12/18 1017  NA 140 143 144  --  141 143 142  K 3.5 3.1* 3.2*  --  3.0* 2.8* 2.8*  CL 101  --   --   --  101  --   --   CO2 25  --   --   --  29  --   --   GLUCOSE 226*  --   --   --  99  --   --   BUN 50*  --   --   --  52*  --   --   CREATININE 2.44*  --   --  2.44* 2.33*  --   --   CALCIUM 9.7  --   --   --  9.6  --   --   MG  --   --   --   --  2.3  --   --   PHOS  --   --   --   --  3.7  --   --    GFR: Estimated Creatinine Clearance: 20.3 mL/min (A) (by C-G formula based on SCr of 2.33 mg/dL (H)). Recent Labs  Lab 07/11/18 0751 07/11/18 1653 07/12/18 0419  WBC 16.1* 10.4 6.8    Liver Function Tests: Recent Labs  Lab 07/11/18 0751  AST 26  ALT 16  ALKPHOS 79  BILITOT 0.9  PROT 7.6  ALBUMIN 3.7   No results for input(s): LIPASE, AMYLASE in the last 168 hours. No results for input(s): AMMONIA in the last 168 hours.  ABG    Component Value Date/Time   PHART 7.581 (H) 07/12/2018 1017   PCO2ART 30.9 (L) 07/12/2018 1017   PO2ART 79.0 (L) 07/12/2018 1017   HCO3 29.2 (H) 07/12/2018 1017   TCO2 30 07/12/2018 1017   ACIDBASEDEF 2.0 05/31/2017 1255   O2SAT 98.0 07/12/2018 1017     Coagulation Profile: No results for input(s): INR, PROTIME in the last 168 hours.  Cardiac Enzymes: Recent Labs  Lab 07/11/18 0751  TROPONINI 0.04*    HbA1C: Hgb A1c MFr Bld  Date/Time Value Ref Range Status  05/30/2017 02:54 AM 5.3 4.8 - 5.6 % Final    Comment:    (NOTE) Pre diabetes:          5.7%-6.4% Diabetes:              >6.4% Glycemic control for   <7.0% adults  with diabetes   02/04/2017 09:01 PM 5.5 4.8 - 5.6 % Final    Comment:    (NOTE) Pre diabetes:          5.7%-6.4% Diabetes:              >6.4% Glycemic control for   <7.0% adults with diabetes     CBG: Recent Labs  Lab 07/12/18 1644 07/12/18 2054 07/12/18 2256 07/13/18 0439 07/13/18 0804  GLUCAP 78 94 97 97 108*     Discussed with Dr. Thereasa Solo who has agreed take patient on  [facility] service tomorrow.    Garner Nash, DO [facility] 07/13/2018 6:31 PM  Personal pager: [phone] If unanswered, please page CCM On-call: [phone]

## 2018-07-13 NOTE — Progress Notes (Signed)
Patient transferred to room 5W17. RN updated patient's husband.

## 2018-07-13 NOTE — Progress Notes (Addendum)
Nutrition Consult - Brief Note RD working remotely.  Received MD Consult for TF initiation and management, however, patient has been extubated and no longer requires tube feeding. No acute nutrition issues identified on review of medical record. Please re-consult RD if nutrition concerns arise.  Molli Barrows, RD, LDN, [place] Pager [phone] After Hours Pager [phone]

## 2018-07-13 NOTE — Progress Notes (Addendum)
Patient trasfered from 51M to 5W17 via bed; alert and oriented x 4; no complaints of pain; IVs saline locked in LAC, RW; RFA; skin intact. Orient patient to room and unit; instructed how to use the call bell and  fall risk precautions. Will continue to monitor the patient.

## 2018-07-13 NOTE — Progress Notes (Signed)
CCMD informed staff that patient had 3 beats of V-tach, non-sustained. Patient asymptomatic. MD made aware. Will continue to monitor.

## 2018-07-14 ENCOUNTER — Inpatient Hospital Stay (HOSPITAL_COMMUNITY): Payer: Medicare Other

## 2018-07-14 DIAGNOSIS — R0602 Shortness of breath: Secondary | ICD-10-CM

## 2018-07-14 DIAGNOSIS — R0902 Hypoxemia: Secondary | ICD-10-CM

## 2018-07-14 DIAGNOSIS — R509 Fever, unspecified: Secondary | ICD-10-CM

## 2018-07-14 LAB — URINALYSIS, ROUTINE W REFLEX MICROSCOPIC
Bilirubin Urine: NEGATIVE
Bilirubin Urine: NEGATIVE
Glucose, UA: NEGATIVE mg/dL
Glucose, UA: NEGATIVE mg/dL
Ketones, ur: NEGATIVE mg/dL
Ketones, ur: NEGATIVE mg/dL
Nitrite: NEGATIVE
Nitrite: NEGATIVE
Protein, ur: NEGATIVE mg/dL
Protein, ur: NEGATIVE mg/dL
Specific Gravity, Urine: 1.009 (ref 1.005–1.030)
Specific Gravity, Urine: 1.01 (ref 1.005–1.030)
pH: 5 (ref 5.0–8.0)
pH: 5 (ref 5.0–8.0)

## 2018-07-14 LAB — GLUCOSE, CAPILLARY
Glucose-Capillary: 104 mg/dL — ABNORMAL HIGH (ref 70–99)
Glucose-Capillary: 105 mg/dL — ABNORMAL HIGH (ref 70–99)
Glucose-Capillary: 119 mg/dL — ABNORMAL HIGH (ref 70–99)
Glucose-Capillary: 130 mg/dL — ABNORMAL HIGH (ref 70–99)
Glucose-Capillary: 139 mg/dL — ABNORMAL HIGH (ref 70–99)

## 2018-07-14 LAB — FERRITIN: Ferritin: 1059 ng/mL — ABNORMAL HIGH (ref 11–307)

## 2018-07-14 LAB — D-DIMER, QUANTITATIVE: D-Dimer, Quant: 4.61 ug/mL-FEU — ABNORMAL HIGH (ref 0.00–0.50)

## 2018-07-14 LAB — BRAIN NATRIURETIC PEPTIDE: B Natriuretic Peptide: 3068.5 pg/mL — ABNORMAL HIGH (ref 0.0–100.0)

## 2018-07-14 LAB — C-REACTIVE PROTEIN: CRP: 20.6 mg/dL — ABNORMAL HIGH (ref <1.0)

## 2018-07-14 LAB — PROCALCITONIN: Procalcitonin: 3.57 ng/mL

## 2018-07-14 MED ORDER — SPIRONOLACTONE 25 MG PO TABS
25.0000 mg | ORAL_TABLET | Freq: Every day | ORAL | Status: DC
  Administered 2018-07-14 – 2018-07-15 (×2): 25 mg via ORAL
  Filled 2018-07-14 (×3): qty 1

## 2018-07-14 MED ORDER — SODIUM CHLORIDE 0.9 % IV SOLN
1.0000 g | INTRAVENOUS | Status: DC
  Administered 2018-07-14 – 2018-07-15 (×2): 1 g via INTRAVENOUS
  Filled 2018-07-14 (×2): qty 10

## 2018-07-14 MED ORDER — ACETAMINOPHEN 325 MG PO TABS
650.0000 mg | ORAL_TABLET | Freq: Four times a day (QID) | ORAL | Status: DC | PRN
  Administered 2018-07-14 (×4): 650 mg via ORAL
  Filled 2018-07-14 (×5): qty 2

## 2018-07-14 NOTE — Evaluation (Addendum)
Physical Therapy Evaluation Patient Details Name: [name] MRN: [id number] DOB: 1946-03-27 Today's Date: 07/14/2018   History of Present Illness  73 year old woman with a history of diastolic heart failure and CKD. Presents with 3-day worsening shortness of breath without chest pain. No history of fever. Associated cough. Brought in by EMS for respiratory distress. No sick contacts.  Clinical Impression  Pt admitted with above diagnosis. Pt currently with functional limitations due to the deficits listed below (see PT Problem List). Pt was unable to stand to RW even with max assist.  Attempted multiple times. Pt states, "My husband can get me up when I get home."  Concerned that pt is going to struggle at home and unsure if husband can provide appropriate assist. O2 on RA >90%.  Replaced the O2 at 2L as pt had it on on arrival and wore at night PTA. IF pt refused SNF, recommend hoyer lift and max HH services.  Will follow acutely.  Pt will benefit from skilled PT to increase their independence and safety with mobility to allow discharge to the venue listed below.      Follow Up Recommendations SNF;Supervision/Assistance - 24 hour    Equipment Recommendations  None recommended by PT    Recommendations for Other Services       Precautions / Restrictions Precautions Precautions: Fall Restrictions Weight Bearing Restrictions: No      Mobility  Bed Mobility Overal bed mobility: Needs Assistance Bed Mobility: Supine to Sit     Supine to sit: Mod assist     General bed mobility comments: Incr time to come to EOB.  She states that she didnt need assist to get OOB at home but does here.    Transfers Overall transfer level: Needs assistance Equipment used: Rolling walker (2 wheeled) Transfers: Sit to/from Stand Sit to Stand: Max assist;Total assist;From elevated surface         General transfer comment: 4 attempts to stand but pt unsuccessful each time.  Pt giving little  effort and right knee was hurting.  Pt states "well my husband can get me up."  Ambulation/Gait             General Gait Details: unable as pt couldn't stand up  Stairs            Wheelchair Mobility    Modified Rankin (Stroke Patients Only)       Balance Overall balance assessment: Needs assistance;History of Falls Sitting-balance support: No upper extremity supported;Feet supported Sitting balance-Leahy Scale: Fair     Standing balance support: Bilateral upper extremity supported;During functional activity Standing balance-Leahy Scale: Zero Standing balance comment: Pt could not clear bottom off bed with max assist                              Pertinent Vitals/Pain Pain Assessment: Faces Faces Pain Scale: Hurts whole lot Pain Location: right knee Pain Descriptors / Indicators: Discomfort;Grimacing;Guarding;Aching Pain Intervention(s): Limited activity within patient's tolerance;Monitored during session;Repositioned    Home Living Family/patient expects to be discharged to:: Private residence Living Arrangements: Spouse/significant other Available Help at Discharge: Family;Available 24 hours/day Type of Home: House Home Access: Stairs to enter Entrance Stairs-Rails: None(Pt states that rails are being installed) Technical brewer of Steps: 1 Home Layout: One level Home Equipment: [place] - 2 wheels;Walker - standard;Bedside commode;Cane - single point;Wheelchair - manual;Shower seat;Grab bars - tub/shower      Prior Function Level of Independence: Needs assistance  Gait / Transfers Assistance Needed: Pt states she used walker in home and husband helped her prn  ADL's / Homemaking Assistance Needed: spouse assisted pt with sponge bathing        Hand Dominance   Dominant Hand: Right    Extremity/Trunk Assessment   Upper Extremity Assessment Upper Extremity Assessment: Defer to OT evaluation    Lower Extremity Assessment Lower  Extremity Assessment: RLE deficits/detail;LLE deficits/detail RLE Deficits / Details: grossly 3-/5 LLE Deficits / Details: grossly 3-/5       Communication   Communication: No difficulties  Cognition Arousal/Alertness: Awake/alert Behavior During Therapy: WFL for tasks assessed/performed Overall Cognitive Status: Within Functional Limits for tasks assessed                                        General Comments      Exercises     Assessment/Plan    PT Assessment Patient needs continued PT services  PT Problem List Decreased activity tolerance;Decreased balance;Decreased mobility;Decreased knowledge of use of DME;Decreased safety awareness;Decreased knowledge of precautions;Cardiopulmonary status limiting activity;Obesity;Pain;Decreased strength       PT Treatment Interventions DME instruction;Gait training;Functional mobility training;Therapeutic activities;Therapeutic exercise;Balance training;Patient/family education;Stair training    PT Goals (Current goals can be found in the Care Plan section)  Acute Rehab PT Goals Patient Stated Goal: to go home and husband to help PT Goal Formulation: With patient Time For Goal Achievement: 07/28/18 Potential to Achieve Goals: Good    Frequency Min 3X/week   Barriers to discharge        Co-evaluation               AM-PAC PT "6 Clicks" Mobility  Outcome Measure Help needed turning from your back to your side while in a flat bed without using bedrails?: A Lot Help needed moving from lying on your back to sitting on the side of a flat bed without using bedrails?: A Lot Help needed moving to and from a bed to a chair (including a wheelchair)?: Total Help needed standing up from a chair using your arms (e.g., wheelchair or bedside chair)?: Total Help needed to walk in hospital room?: Total Help needed climbing 3-5 steps with a railing? : Total 6 Click Score: 8    End of Session Equipment Utilized During  Treatment: Gait belt;Oxygen Activity Tolerance: Patient limited by fatigue Patient left: with call bell/phone within reach;in bed;with nursing/sitter in room Nurse Communication: Mobility status PT Visit Diagnosis: Unsteadiness on feet (R26.81);Muscle weakness (generalized) (M62.81);History of falling (Z91.81)    Time: 2694-8546 PT Time Calculation (min) (ACUTE ONLY): 21 min   Charges:   PT Evaluation $PT Eval Moderate Complexity: 1 Mod          Jaylina Ramdass,PT Acute Rehabilitation Services Pager:  [phone]  Office:  [phone]    Denice Paradise 07/14/2018, 10:42 AM

## 2018-07-14 NOTE — Progress Notes (Signed)
PROGRESS NOTE                                                                                                                                                                                                             Patient Demographics:    [name], is a 73 y.o. female, DOB - July 03, 1945, ZHG:[id number]  Admit date - 07/11/2018   Admitting Physician Kipp Brood, MD  Outpatient Primary MD for the patient is Hoyt Koch, MD  LOS - 3  Chief Complaint  Patient presents with   Shortness of Breath       Brief Narrative  73 yo AA female, FM, AHRF, +BNP, h/o Diastolic HF. +cough, no fevers.  Was admitted to ICU briefly intubated, COVID-19 was ruled out, she was diuresed extubated and transferred to a care on 07/14/2018.   Subjective:    [name] today has, No headache, No chest pain, No abdominal pain - No Nausea, No new weakness tingling or numbness, No Cough - SOB.    Assessment  & Plan :     1.  Acute hypoxic respiratory failure due to acute on chronic diastolic CHF.  Last echo done in 2018 showed preserved EF.  She was briefly intubated, diuresed, continue IV Lasix Coreg, shortness of breath has improved, she claims she is on home oxygen which will be continued.  Continue diuresis add Aldactone monitor electrolytes.  Increase activity.  2.  Bedbound/wheelchair bound status at baseline.  PT.  Patient eventually wants to go home.  3.  Fever morning of 07/14/2018.  Blood and urine cultures done, UA suggestive of UTI, CT chest unconvincing for an infection.  Placed on Rocephin and monitor.  4.  Hypertension.  On Coreg and diuresis continue to monitor.  5.  PAD 4.  Creatinine at her baseline.    Family Communication  :  None  Code Status :  Full  Disposition Plan  :  TBD  Consults  :  PCCM  Procedures  :     TTE - 2018 -   Septum: No Patent Foramen Ovale present.  Left atrium: Patent foramen ovale not present.  Aortic valve: The valve is  trileaflet. No stenosis. Moderate to severe regurgitation. No AV vegetation.  Aorta: The aortic root is dilated. Aneurysm present in the ascending aorta, transverse aorta and descending aorta. 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  Mitral valve: Mild regurgitation.  Right ventricle: Normal cavity size, wall thickness  and ejection fraction.  Tricuspid valve: Trace regurgitation.  Pericardium: Large circumferential pericardial effusion. Aorta: Graft present in the ascending aorta.   ETT 07/11/18 - 07/13/18  CT - 1. No convincing pneumonia. Small right and minimal left pleural effusions. Dependent lower lobe opacity most consistent with atelectasis with additional areas of linear atelectasis in the mid to lower lungs. No pulmonary edema. 2. Stable cardiomegaly and changes from prior cardiac surgery. 3. Chronic dissection of the thoracoabdominal aorta, assessment limited by lack of intravenous contrast. Aortic Atherosclerosis   DVT Prophylaxis  :   Heparin   Lab Results  Component Value Date   PLT 172 07/12/2018    Diet :  Diet Order            Diet Heart Room service appropriate? No; Fluid consistency: Thin; Fluid restriction: 1200 mL Fluid  Diet effective now               Inpatient Medications Scheduled Meds:  calcitRIOL  0.25 mcg Oral Once per day on Mon Wed Fri   carvedilol  12.5 mg Oral BID WC   famotidine  20 mg Per Tube Daily   furosemide  80 mg Intravenous BID   heparin injection (subcutaneous)  5,000 Units Subcutaneous Q8H   mouth rinse  15 mL Mouth Rinse BID   spironolactone  25 mg Oral Daily   Continuous Infusions:  cefTRIAXone (ROCEPHIN)  IV     PRN Meds:.acetaminophen, fentaNYL (SUBLIMAZE) injection, fentaNYL (SUBLIMAZE) injection, ondansetron (ZOFRAN) IV, sennosides  Antibiotics  :   Anti-infectives (From admission, onward)   Start     Dose/Rate Route Frequency Ordered Stop   07/14/18 1100  cefTRIAXone  (ROCEPHIN) 1 g in sodium chloride 0.9 % 100 mL IVPB     1 g 200 mL/hr over 30 Minutes Intravenous Every 24 hours 07/14/18 1050 07/17/18 1059          Objective:   Vitals:   07/14/18 0426 07/14/18 0500 07/14/18 0831 07/14/18 1020  BP: 123/71  (!) 141/76   Pulse: 81  90   Resp:   20   Temp: 99.4 F (37.4 C)  (!) 101.8 F (38.8 C) 98.6 F (37 C)  TempSrc: Oral  Oral Oral  SpO2: 95%  95%   Weight:  76.7 kg    Height:        Wt Readings from Last 3 Encounters:  07/14/18 76.7 kg  03/04/18 76.2 kg  02/11/18 73 kg     Intake/Output Summary (Last 24 hours) at 07/14/2018 1051 Last data filed at 07/14/2018 0302 Gross per 24 hour  Intake 60 ml  Output 1200 ml  Net -1140 ml     Physical Exam  Awake Alert, Oriented X 3, No new F.N deficits, Normal affect [place].AT,PERRAL Supple Neck,No JVD, No cervical lymphadenopathy appriciated.  Symmetrical Chest wall movement, Good air movement bilaterally, few rales RRR,No Gallops,Rubs or new Murmurs, No Parasternal Heave +ve B.Sounds, Abd Soft, No tenderness, No organomegaly appriciated, No rebound - guarding or rigidity. No Cyanosis, Clubbing or edema, No new Rash or bruise       Data Review:    CBC Recent Labs  Lab 07/11/18 0751  07/11/18 1240 07/11/18 1653 07/12/18 0419 07/12/18 0441 07/12/18 1017  WBC 16.1*  --   --  10.4 6.8  --   --   HGB 9.9*   < > 8.8* 8.5* 9.7* 10.2* 9.5*  HCT 33.0*   < > 26.0* 27.8* 30.6* 30.0* 28.0*  PLT 267  --   --  186 172  --   --   MCV 94.0  --   --  91.1 91.3  --   --   MCH 28.2  --   --  27.9 29.0  --   --   MCHC 30.0  --   --  30.6 31.7  --   --   RDW 17.2*  --   --  17.2* 17.8*  --   --   LYMPHSABS 1.4  --   --   --   --   --   --   MONOABS 0.7  --   --   --   --   --   --   EOSABS 0.2  --   --   --   --   --   --   BASOSABS 0.0  --   --   --   --   --   --    < > = values in this interval not displayed.    Chemistries  Recent Labs  Lab 07/11/18 0751  07/11/18 1240 07/11/18 1653  07/12/18 0419 07/12/18 0441 07/12/18 1017 07/13/18 0954  NA 140   < > 144  --  141 143 142 143  K 3.5   < > 3.2*  --  3.0* 2.8* 2.8* 4.0  CL 101  --   --   --  101  --   --  104  CO2 25  --   --   --  29  --   --  28  GLUCOSE 226*  --   --   --  99  --   --  124*  BUN 50*  --   --   --  52*  --   --  52*  CREATININE 2.44*  --   --  2.44* 2.33*  --   --  2.52*  CALCIUM 9.7  --   --   --  9.6  --   --  9.4  MG  --   --   --   --  2.3  --   --  2.1  AST 26  --   --   --   --   --   --   --   ALT 16  --   --   --   --   --   --   --   ALKPHOS 79  --   --   --   --   --   --   --   BILITOT 0.9  --   --   --   --   --   --   --    < > = values in this interval not displayed.   ------------------------------------------------------------------------------------------------------------------ Recent Labs    07/11/18 1213  TRIG 87    Lab Results  Component Value Date   HGBA1C 5.3 05/30/2017   ------------------------------------------------------------------------------------------------------------------ No results for input(s): TSH, T4TOTAL, T3FREE, THYROIDAB in the last 72 hours.  Invalid input(s): FREET3 ------------------------------------------------------------------------------------------------------------------ No results for input(s): VITAMINB12, FOLATE, FERRITIN, TIBC, IRON, RETICCTPCT in the last 72 hours.  Coagulation profile No results for input(s): INR, PROTIME in the last 168 hours.  No results for input(s): DDIMER in the last 72 hours.  Cardiac Enzymes Recent Labs  Lab 07/11/18 0751  TROPONINI 0.04*   ------------------------------------------------------------------------------------------------------------------    Component Value Date/Time   BNP 3,068.5 (H) 07/14/2018 0930    Micro Results Recent Results (from the past 240  hour(s))  Novel Coronavirus, NAA (hospital order; send-out to ref lab)     Status: None   Collection Time: 07/11/18 10:30 AM   Result Value Ref Range Status   SARS-CoV-2, NAA NOT DETECTED NOT DETECTED Final    Comment: Performed at [facility]   Coronavirus Source NASOPHARYNGEAL  Final    Comment: Performed at [facility], [place] [address], [place], [place] [address]  MRSA PCR Screening     Status: None   Collection Time: 07/11/18  3:53 PM  Result Value Ref Range Status   MRSA by PCR NEGATIVE NEGATIVE Final    Comment:        The GeneXpert MRSA Assay (FDA approved for NASAL specimens only), is one component of a comprehensive MRSA colonization surveillance program. It is not intended to diagnose MRSA infection nor to guide or monitor treatment for MRSA infections. Performed at [facility], [place] [address], [place], [place] [address]   Respiratory Panel by PCR     Status: None   Collection Time: 07/12/18  1:00 PM  Result Value Ref Range Status   Adenovirus NOT DETECTED NOT DETECTED Final   Coronavirus 229E NOT DETECTED NOT DETECTED Final    Comment: (NOTE) The Coronavirus on the Respiratory Panel, DOES NOT test for the novel  Coronavirus (2019 nCoV)    Coronavirus HKU1 NOT DETECTED NOT DETECTED Final   Coronavirus NL63 NOT DETECTED NOT DETECTED Final   Coronavirus OC43 NOT DETECTED NOT DETECTED Final   Metapneumovirus NOT DETECTED NOT DETECTED Final   Rhinovirus / Enterovirus NOT DETECTED NOT DETECTED Final   Influenza A NOT DETECTED NOT DETECTED Final   Influenza B NOT DETECTED NOT DETECTED Final   Parainfluenza Virus 1 NOT DETECTED NOT DETECTED Final   Parainfluenza Virus 2 NOT DETECTED NOT DETECTED Final   Parainfluenza Virus 3 NOT DETECTED NOT DETECTED Final   Parainfluenza Virus 4 NOT DETECTED NOT DETECTED Final   Respiratory Syncytial Virus NOT DETECTED NOT DETECTED Final   Bordetella pertussis NOT DETECTED NOT DETECTED Final   Chlamydophila pneumoniae NOT DETECTED NOT DETECTED Final   Mycoplasma pneumoniae NOT DETECTED NOT DETECTED Final    Comment: Performed at [facility], [place]. [address] [place], [place] [address]    Radiology Reports Ct Chest Wo Contrast  Result Date: 07/14/2018 CLINICAL DATA:  Follow-up for cough and shortness of breath and pneumonia. EXAM: CT CHEST WITHOUT CONTRAST TECHNIQUE: Multidetector CT imaging of the chest was performed following the standard protocol without IV contrast. COMPARISON:  Chest radiographs, most recent dated 07/11/2018 FINDINGS: Cardiovascular: Heart mildly enlarged. Three-vessel coronary artery calcifications. No pericardial effusion. There changes from prior cardiac surgery and aortic valve replacement. There are calcifications displaced away from the intima the thoracic aorta from the arch through the descending portion consistent with a chronic dissection. Aortic arch is dilated to 4.4 cm. Mediastinum/Nodes: No neck base or axillary masses or enlarged lymph nodes. There are multiple subcentimeter mediastinal and hilar lymph nodes, but no masses or pathologically enlarged lymph nodes. Trachea and esophagus are unremarkable. Lungs/Pleura: Small right and trace left pleural effusions. There is dependent opacity in the lower lobes consistent with atelectasis. There are additional linear opacities in the mid and lower lungs also consistent with atelectasis. There is no convincing pneumonia. There is no evidence of pulmonary edema. No pneumothorax. Upper Abdomen: No acute findings. Musculoskeletal: No fracture or acute finding. No osteoblastic or osteolytic lesions. IMPRESSION: 1. No convincing pneumonia. Small right and  minimal left pleural effusions. Dependent lower lobe opacity most consistent with atelectasis with additional areas of linear atelectasis in the mid to lower lungs. No pulmonary edema. 2. Stable cardiomegaly and changes from prior cardiac surgery. 3. Chronic dissection of the thoracoabdominal aorta, assessment limited by lack of intravenous contrast. Aortic Atherosclerosis (ICD10-I70.0). Electronically Signed    By: Lajean Manes M.D.   On: 07/14/2018 10:46   Dg Chest Port 1 View  Result Date: 07/11/2018 CLINICAL DATA:  CHF. EXAM: PORTABLE CHEST 1 VIEW COMPARISON:  Earlier today FINDINGS: Endotracheal tube tip is 1 cm above the carina. The orogastric tube reaches the stomach at least. Borderline improvement in aeration. There is still airspace opacity at the bases with probable small pleural effusions. No visible pneumothorax, limited by chin positioning. Cardiomegaly.  Aortic valve replacement IMPRESSION: 1. Retracted endotracheal tube with tip 1 cm above the carina. 2. Equivocal improvement in pulmonary edema. There is still airspace disease at the bases. Electronically Signed   By: Monte Fantasia M.D.   On: 07/11/2018 13:21   Dg Chest Portable 1 View  Result Date: 07/11/2018 CLINICAL DATA:  Respiratory distress.  Patient intubated. EXAM: PORTABLE CHEST 1 VIEW COMPARISON:  09/03/2017 FINDINGS: There changes from cardiac surgery and valve replacement. Cardiac silhouette is mildly enlarged. No mediastinal or hilar masses. There is central vascular congestion. Hazy perihilar and lower lung zone airspace opacities are noted. There are small bilateral pleural effusions. No convincing pneumothorax. Endotracheal tube tip projects at the [place]. Nasal/orogastric tube passes well below the diaphragm into the stomach. IMPRESSION: 1. Findings consistent with congestive heart failure with perihilar and lower lung zone airspace pulmonary edema and small bilateral pleural effusions. 2. Endotracheal tube tip projects at the [place]. Consider retracting 1-2 cm for more optimal positioning. 3. Well-positioned nasal/orogastric tube. Electronically Signed   By: Lajean Manes M.D.   On: 07/11/2018 09:15    Time Spent in minutes  30   Lala Lund M.D on 07/14/2018 at 10:51 AM  To page go to [email or web address] - password [email or web address]

## 2018-07-14 NOTE — Progress Notes (Signed)
eLink Physician-Brief Progress Note Patient Name: Nansi Birmingham DOB: 07-Jun-1945 MRN: [id number]   Date of Service  07/14/2018  HPI/Events of Note  Fever to 101.9 F - Request for Tylenol. AST and ALT both are normal.   eICU Interventions  Will order: 1. UA with Reflex Microscopic.  2. Blood cultures X 2 now.  3. Tylenol 650 mg PO Q 6 hours PRN Temp > 101.0 F. 4. Portable CXR in AM.      Intervention Category Major Interventions: Other:  Lysle Dingwall 07/14/2018, 12:33 AM

## 2018-07-15 ENCOUNTER — Inpatient Hospital Stay (HOSPITAL_COMMUNITY): Payer: Medicare Other

## 2018-07-15 ENCOUNTER — Encounter (HOSPITAL_COMMUNITY): Payer: Medicare Other

## 2018-07-15 DIAGNOSIS — M7989 Other specified soft tissue disorders: Secondary | ICD-10-CM

## 2018-07-15 LAB — COMPREHENSIVE METABOLIC PANEL
ALT: 12 U/L (ref 0–44)
AST: 19 U/L (ref 15–41)
Albumin: 2.8 g/dL — ABNORMAL LOW (ref 3.5–5.0)
Alkaline Phosphatase: 56 U/L (ref 38–126)
Anion gap: 9 (ref 5–15)
BUN: 52 mg/dL — ABNORMAL HIGH (ref 8–23)
CO2: 27 mmol/L (ref 22–32)
Calcium: 9.6 mg/dL (ref 8.9–10.3)
Chloride: 102 mmol/L (ref 98–111)
Creatinine, Ser: 2.68 mg/dL — ABNORMAL HIGH (ref 0.44–1.00)
GFR calc Af Amer: 20 mL/min — ABNORMAL LOW (ref >60)
GFR calc non Af Amer: 17 mL/min — ABNORMAL LOW (ref >60)
Glucose, Bld: 96 mg/dL (ref 70–99)
Potassium: 2.9 mmol/L — ABNORMAL LOW (ref 3.5–5.1)
Sodium: 138 mmol/L (ref 135–145)
Total Bilirubin: 0.6 mg/dL (ref 0.3–1.2)
Total Protein: 6.7 g/dL (ref 6.5–8.1)

## 2018-07-15 LAB — CBC WITH DIFFERENTIAL/PLATELET
Abs Immature Granulocytes: 0.03 10*3/uL (ref 0.00–0.07)
Basophils Absolute: 0 10*3/uL (ref 0.0–0.1)
Basophils Relative: 0 %
Eosinophils Absolute: 0.1 10*3/uL (ref 0.0–0.5)
Eosinophils Relative: 1 %
HCT: 26.5 % — ABNORMAL LOW (ref 36.0–46.0)
Hemoglobin: 8.4 g/dL — ABNORMAL LOW (ref 12.0–15.0)
Immature Granulocytes: 0 %
Lymphocytes Relative: 18 %
Lymphs Abs: 1.3 10*3/uL (ref 0.7–4.0)
MCH: 28.9 pg (ref 26.0–34.0)
MCHC: 31.7 g/dL (ref 30.0–36.0)
MCV: 91.1 fL (ref 80.0–100.0)
Monocytes Absolute: 0.8 10*3/uL (ref 0.1–1.0)
Monocytes Relative: 10 %
Neutro Abs: 5.2 10*3/uL (ref 1.7–7.7)
Neutrophils Relative %: 71 %
Platelets: 187 10*3/uL (ref 150–400)
RBC: 2.91 MIL/uL — ABNORMAL LOW (ref 3.87–5.11)
RDW: 17.3 % — ABNORMAL HIGH (ref 11.5–15.5)
WBC: 7.4 10*3/uL (ref 4.0–10.5)
nRBC: 0 % (ref 0.0–0.2)

## 2018-07-15 LAB — GLUCOSE, CAPILLARY
Glucose-Capillary: 114 mg/dL — ABNORMAL HIGH (ref 70–99)
Glucose-Capillary: 90 mg/dL (ref 70–99)
Glucose-Capillary: 90 mg/dL (ref 70–99)
Glucose-Capillary: 91 mg/dL (ref 70–99)
Glucose-Capillary: 98 mg/dL (ref 70–99)

## 2018-07-15 LAB — MAGNESIUM: Magnesium: 2.1 mg/dL (ref 1.7–2.4)

## 2018-07-15 MED ORDER — AMLODIPINE BESYLATE 5 MG PO TABS
5.0000 mg | ORAL_TABLET | Freq: Every day | ORAL | Status: DC
  Administered 2018-07-15: 5 mg via ORAL
  Filled 2018-07-15: qty 1

## 2018-07-15 MED ORDER — POTASSIUM CHLORIDE CRYS ER 20 MEQ PO TBCR
40.0000 meq | EXTENDED_RELEASE_TABLET | Freq: Once | ORAL | Status: DC
  Filled 2018-07-15: qty 2

## 2018-07-15 MED ORDER — FUROSEMIDE 80 MG PO TABS
160.0000 mg | ORAL_TABLET | Freq: Two times a day (BID) | ORAL | Status: DC
  Administered 2018-07-15: 160 mg via ORAL
  Filled 2018-07-15 (×2): qty 2

## 2018-07-15 MED ORDER — VANCOMYCIN HCL 1000 MG IV SOLR
1000.0000 mg | Freq: Once | INTRAVENOUS | Status: DC
  Filled 2018-07-15: qty 1000

## 2018-07-15 MED ORDER — POTASSIUM CHLORIDE 20 MEQ/15ML (10%) PO SOLN
40.0000 meq | Freq: Once | ORAL | Status: AC
  Administered 2018-07-15: 40 meq via ORAL
  Filled 2018-07-15: qty 30

## 2018-07-15 MED ORDER — VANCOMYCIN HCL 1000 MG IV SOLR
1000.0000 mg | INTRAVENOUS | Status: DC
  Administered 2018-07-15: 1000 mg via INTRAVENOUS
  Filled 2018-07-15: qty 1000

## 2018-07-15 MED ORDER — PROCHLORPERAZINE EDISYLATE 10 MG/2ML IJ SOLN
5.0000 mg | Freq: Once | INTRAMUSCULAR | Status: AC
  Administered 2018-07-15: 5 mg via INTRAVENOUS
  Filled 2018-07-15: qty 2

## 2018-07-15 NOTE — Progress Notes (Signed)
PROGRESS NOTE  [name]  ZOX:[id number] DOB: October 25, 1945 DOA: 07/11/2018 PCP: Hoyt Koch, MD   Brief Narrative: 73 yo AA female, FM, AHRF, +BNP, h/o Diastolic HF. +cough, no fevers.  Was admitted to ICU briefly intubated, COVID-19 was ruled out, she was diuresed extubated and transferred to telemetry on 07/14/2018. She developed a fever, found to have pyuria and hx UTIs, started on CTX.  Assessment & Plan: Active Problems:   CHF (congestive heart failure) (HCC)  Acute on chronic hypoxic respiratory failure: Reports using 2L nocturnal O2 at home. Exacerbated due to CHF, improving.  - Continue supplemental oxygen to maintain SpO2 >90%.  - Diuresis as below  Acute on chronic HFpEF, HTN: Down ~5L since admission.  - Convert to po lasix home dose 160mg  po BID - Continue coreg, norvasc - Spironolactone added 4/7 - Monitor daily weights, strict I/O  Fever: Presumed to be due to UTI, has h/o same and urinary urgency/frequency, albeit with diuresis. Has ruled out for covid, CT chest not convincing for pulmonary infection.  - Continue ceftriaxone, monitoring blood and urine cultures.  - Note d-dimer elevated, still w/dependent edema but pulmonary symptoms improving, will r/o DVT.   CKD stage IV: Creatinine elevated but near baseline.  - Avoid nephrotoxins including contrast.  - Continue home calcitriol  Hypokalemia: Worsened by IV diuresis. On home supplement. - Supplement and recheck, Mg ok at 2.1.   Anemia of chronic disease:  - Monitor in AM. Downtrend during hospitalization, but suspect she's settling to baseline in 8-9 range.   History of severe AR s/p bioprosthetic AVR Feb 2019:  - Continue ASA  Ascending thoracic aortic dissection s/p graft: - Continue ASA  Bilateral knee pain, OA:  - Continue orthopedics follow up for nonoperative management/injections.   DVT prophylaxis: Heparin Code Status: Full Family Communication: Declines offer to call family Disposition  Plan: PT recommending SNF / supervision/assistance x24 hrs. Per their note, patient is reporting her husband can care for her at home but is unable to rise to RW with max assist. Harrel Lemon lift and maximal [facility] services would be recommended.   Consultants:   PCCM   Procedures:   None  Antimicrobials:  Ceftriaxone 4/7 >>    Subjective: Breathing better than admission, nearly back to baseline. Still having fevers but denies any chest pain, palpitations. Having urinary urgency and frequency, no abd pain, N/V. Some loose stool this morning, not ongoing. Leg swelling is stable, possibly slightly improved.  Objective: Vitals:   07/14/18 1631 07/14/18 1759 07/14/18 1952 07/15/18 0437  BP: 129/71  134/67 (!) 151/73  Pulse: 79  79 82  Resp:   16 18  Temp: (!) 100.5 F (38.1 C) 99.7 F (37.6 C) (!) 101.1 F (38.4 C) 98.9 F (37.2 C)  TempSrc: Oral Oral Oral Oral  SpO2: 97%  96% 94%  Weight:    73.9 kg  Height:        Intake/Output Summary (Last 24 hours) at 07/15/2018 1145 Last data filed at 07/15/2018 0422 Gross per 24 hour  Intake 340 ml  Output 925 ml  Net -585 ml   Filed Weights   07/13/18 0500 07/14/18 0500 07/15/18 0437  Weight: 75.9 kg 76.7 kg 73.9 kg    Gen: 73 y.o. female in no distress  Pulm: Non-labored breathing. Clear to auscultation bilaterally.  CV: Regular rate and rhythm. II/VI systolic murmur, prominent S2, no gallop or JVD.  GI: Abdomen soft, non-tender, non-distended, with normoactive bowel sounds. No organomegaly or masses felt.  Ext: Warm, no deformities. 1+ pitting LE edema Skin: No new rashes, lesions or ulcers Neuro: Alert and oriented. No focal neurological deficits. Psych: Judgement and insight appear normal. Mood & affect appropriate.   Data Reviewed: I have personally reviewed following labs and imaging studies  CBC: Recent Labs  Lab 07/11/18 0751  07/11/18 1653 07/12/18 0419 07/12/18 0441 07/12/18 1017 07/15/18 0242  WBC 16.1*  --  10.4 6.8   --   --  7.4  NEUTROABS 13.7*  --   --   --   --   --  5.2  HGB 9.9*   < > 8.5* 9.7* 10.2* 9.5* 8.4*  HCT 33.0*   < > 27.8* 30.6* 30.0* 28.0* 26.5*  MCV 94.0  --  91.1 91.3  --   --  91.1  PLT 267  --  186 172  --   --  187   < > = values in this interval not displayed.   Basic Metabolic Panel: Recent Labs  Lab 07/11/18 0751  07/11/18 1653 07/12/18 0419 07/12/18 0441 07/12/18 1017 07/13/18 0954 07/15/18 0242  NA 140   < >  --  141 143 142 143 138  K 3.5   < >  --  3.0* 2.8* 2.8* 4.0 2.9*  CL 101  --   --  101  --   --  104 102  CO2 25  --   --  29  --   --  28 27  GLUCOSE 226*  --   --  99  --   --  124* 96  BUN 50*  --   --  52*  --   --  52* 52*  CREATININE 2.44*  --  2.44* 2.33*  --   --  2.52* 2.68*  CALCIUM 9.7  --   --  9.6  --   --  9.4 9.6  MG  --   --   --  2.3  --   --  2.1 2.1  PHOS  --   --   --  3.7  --   --   --   --    < > = values in this interval not displayed.   GFR: Estimated Creatinine Clearance: 17.4 mL/min (A) (by C-G formula based on SCr of 2.68 mg/dL (H)). Liver Function Tests: Recent Labs  Lab 07/11/18 0751 07/15/18 0242  AST 26 19  ALT 16 12  ALKPHOS 79 56  BILITOT 0.9 0.6  PROT 7.6 6.7  ALBUMIN 3.7 2.8*   No results for input(s): LIPASE, AMYLASE in the last 168 hours. No results for input(s): AMMONIA in the last 168 hours. Coagulation Profile: No results for input(s): INR, PROTIME in the last 168 hours. Cardiac Enzymes: Recent Labs  Lab 07/11/18 0751  TROPONINI 0.04*   BNP (last 3 results) No results for input(s): PROBNP in the last 8760 hours. HbA1C: No results for input(s): HGBA1C in the last 72 hours. CBG: Recent Labs  Lab 07/14/18 1203 07/14/18 1946 07/15/18 0016 07/15/18 0802 07/15/18 1135  GLUCAP 139* 105* 91 90 90   Lipid Profile: No results for input(s): CHOL, HDL, LDLCALC, TRIG, CHOLHDL, LDLDIRECT in the last 72 hours. Thyroid Function Tests: No results for input(s): TSH, T4TOTAL, FREET4, T3FREE, THYROIDAB in  the last 72 hours. Anemia Panel: Recent Labs    07/14/18 1052  FERRITIN 1,059*   Urine analysis:    Component Value Date/Time   COLORURINE YELLOW 07/14/2018 1010   APPEARANCEUR HAZY (A)  07/14/2018 1010   LABSPEC 1.010 07/14/2018 1010   PHURINE 5.0 07/14/2018 1010   GLUCOSEU NEGATIVE 07/14/2018 1010   GLUCOSEU NEGATIVE 12/08/2015 1024   HGBUR SMALL (A) 07/14/2018 1010   BILIRUBINUR NEGATIVE 07/14/2018 1010   BILIRUBINUR Neg 05/22/2018 1505   KETONESUR NEGATIVE 07/14/2018 1010   PROTEINUR NEGATIVE 07/14/2018 1010   UROBILINOGEN 0.2 05/22/2018 1505   UROBILINOGEN 0.2 12/08/2015 1024   NITRITE NEGATIVE 07/14/2018 1010   LEUKOCYTESUR MODERATE (A) 07/14/2018 1010   Recent Results (from the past 240 hour(s))  Novel Coronavirus, NAA (hospital order; send-out to ref lab)     Status: None   Collection Time: 07/11/18 10:30 AM  Result Value Ref Range Status   SARS-CoV-2, NAA NOT DETECTED NOT DETECTED Final    Comment: Performed at [facility]  Final    Comment: Performed at [facility], [place] [address], [place], [place] [address]  MRSA PCR Screening     Status: None   Collection Time: 07/11/18  3:53 PM  Result Value Ref Range Status   MRSA by PCR NEGATIVE NEGATIVE Final    Comment:        The GeneXpert MRSA Assay (FDA approved for NASAL specimens only), is one component of a comprehensive MRSA colonization surveillance program. It is not intended to diagnose MRSA infection nor to guide or monitor treatment for MRSA infections. Performed at [facility], [place] [address], [place], [place] [address]   Respiratory Panel by PCR     Status: None   Collection Time: 07/12/18  1:00 PM  Result Value Ref Range Status   Adenovirus NOT DETECTED NOT DETECTED Final   Coronavirus 229E NOT DETECTED NOT DETECTED Final    Comment: (NOTE) The Coronavirus on the Respiratory Panel, DOES NOT test for the novel  Coronavirus (2019 nCoV)     Coronavirus HKU1 NOT DETECTED NOT DETECTED Final   Coronavirus NL63 NOT DETECTED NOT DETECTED Final   Coronavirus OC43 NOT DETECTED NOT DETECTED Final   Metapneumovirus NOT DETECTED NOT DETECTED Final   Rhinovirus / Enterovirus NOT DETECTED NOT DETECTED Final   Influenza A NOT DETECTED NOT DETECTED Final   Influenza B NOT DETECTED NOT DETECTED Final   Parainfluenza Virus 1 NOT DETECTED NOT DETECTED Final   Parainfluenza Virus 2 NOT DETECTED NOT DETECTED Final   Parainfluenza Virus 3 NOT DETECTED NOT DETECTED Final   Parainfluenza Virus 4 NOT DETECTED NOT DETECTED Final   Respiratory Syncytial Virus NOT DETECTED NOT DETECTED Final   Bordetella pertussis NOT DETECTED NOT DETECTED Final   Chlamydophila pneumoniae NOT DETECTED NOT DETECTED Final   Mycoplasma pneumoniae NOT DETECTED NOT DETECTED Final    Comment: Performed at [facility], [place]. [address], [place], [place] [address]  Culture, blood (routine x 2)     Status: None (Preliminary result)   Collection Time: 07/14/18  1:11 AM  Result Value Ref Range Status   Specimen Description BLOOD LEFT HAND  Final   Special Requests   Final    BOTTLES DRAWN AEROBIC AND ANAEROBIC Blood Culture results may not be optimal due to an excessive volume of blood received in culture bottles   Culture   Final    NO GROWTH 1 DAY Performed at [facility], [place] [address] [place], [place] [address]    Report Status PENDING  Incomplete  Culture, blood (routine x 2)     Status: None (Preliminary result)   Collection Time:  07/14/18  1:11 AM  Result Value Ref Range Status   Specimen Description BLOOD RIGHT HAND  Final   Special Requests   Final    BOTTLES DRAWN AEROBIC AND ANAEROBIC Blood Culture results may not be optimal due to an excessive volume of blood received in culture bottles   Culture   Final    NO GROWTH 1 DAY Performed at [place] [address], [place], [place] [address]    Report Status PENDING  Incomplete   Culture, Urine     Status: Abnormal (Preliminary result)   Collection Time: 07/14/18 12:52 PM  Result Value Ref Range Status   Specimen Description URINE, CATHETERIZED  Final   Special Requests   Final    NONE Performed at [facility], [place] [address] [place], [place] [address]    Culture >=100,000 COLONIES/mL ENTEROCOCCUS FAECALIS (A)  Final   Report Status PENDING  Incomplete      Radiology Studies: Ct Chest Wo Contrast  Result Date: 07/14/2018 CLINICAL DATA:  Follow-up for cough and shortness of breath and pneumonia. EXAM: CT CHEST WITHOUT CONTRAST TECHNIQUE: Multidetector CT imaging of the chest was performed following the standard protocol without IV contrast. COMPARISON:  Chest radiographs, most recent dated 07/11/2018 FINDINGS: Cardiovascular: Heart mildly enlarged. Three-vessel coronary artery calcifications. No pericardial effusion. There changes from prior cardiac surgery and aortic valve replacement. There are calcifications displaced away from the intima the thoracic aorta from the arch through the descending portion consistent with a chronic dissection. Aortic arch is dilated to 4.4 cm. Mediastinum/Nodes: No neck base or axillary masses or enlarged lymph nodes. There are multiple subcentimeter mediastinal and hilar lymph nodes, but no masses or pathologically enlarged lymph nodes. Trachea and esophagus are unremarkable. Lungs/Pleura: Small right and trace left pleural effusions. There is dependent opacity in the lower lobes consistent with atelectasis. There are additional linear opacities in the mid and lower lungs also consistent with atelectasis. There is no convincing pneumonia. There is no evidence of pulmonary edema. No pneumothorax. Upper Abdomen: No acute findings. Musculoskeletal: No fracture or acute finding. No osteoblastic or osteolytic lesions. IMPRESSION: 1. No convincing pneumonia. Small right and minimal left pleural effusions. Dependent lower lobe opacity most  consistent with atelectasis with additional areas of linear atelectasis in the mid to lower lungs. No pulmonary edema. 2. Stable cardiomegaly and changes from prior cardiac surgery. 3. Chronic dissection of the thoracoabdominal aorta, assessment limited by lack of intravenous contrast. Aortic Atherosclerosis (ICD10-I70.0). Electronically Signed   By: Lajean Manes M.D.   On: 07/14/2018 10:46   Dg Chest Port 1 View  Result Date: 07/15/2018 CLINICAL DATA:  Fever 5 days with cough.  UTI. EXAM: PORTABLE CHEST 1 VIEW COMPARISON:  07/11/2018 FINDINGS: Interval removal of endotracheal tube and nasogastric tubes. Prosthetic aortic valve unchanged. Lungs are adequately inflated with minimal left base opacification likely small amount of left pleural fluid and associated atelectasis although infection is possible. Stable cardiomegaly. Remainder of the exam is unchanged. IMPRESSION: Mild left base opacification likely small effusion with atelectasis, although infection is possible. Stable cardiomegaly. Electronically Signed   By: Marin Olp M.D.   On: 07/15/2018 08:20    Scheduled Meds:  calcitRIOL  0.25 mcg Oral Once per day on Mon Wed Fri   carvedilol  12.5 mg Oral BID WC   famotidine  20 mg Per Tube Daily   furosemide  80 mg Intravenous BID   heparin injection (subcutaneous)  5,000 Units Subcutaneous Q8H  mouth rinse  15 mL Mouth Rinse BID   spironolactone  25 mg Oral Daily   Continuous Infusions:  cefTRIAXone (ROCEPHIN)  IV 1 g (07/14/18 1237)     LOS: 4 days   Time spent: 25 minutes.  Patrecia Pour, MD [facility] [email or web address] Password [email or web address] 07/15/2018, 11:45 AM

## 2018-07-15 NOTE — Progress Notes (Signed)
Physical Therapy Treatment Patient Details Name: [name] MRN: [id number] DOB: 05/22/1945 Today's Date: 07/15/2018    History of Present Illness 73 year old woman with a history of diastolic heart failure and CKD. Presents with 3-day worsening shortness of breath without chest pain. No history of fever. Associated cough. Brought in by EMS for respiratory distress. No sick contacts.    PT Comments    Patient progressing with therapy today, able to stand EOB with max A and take several side steps. anticipate next visit will be ready for SPT's to chair. Patient however reports moving slower and more laboriously than baseline. Cont to rec SNF.    Follow Up Recommendations  SNF;Supervision/Assistance - 24 hour     Equipment Recommendations  None recommended by PT    Recommendations for Other Services       Precautions / Restrictions Precautions Precautions: Fall Restrictions Weight Bearing Restrictions: No    Mobility  Bed Mobility Overal bed mobility: Needs Assistance Bed Mobility: Supine to Sit     Supine to sit: Mod assist     General bed mobility comments: Incr time to come to EOB.  She states that she didnt need assist to get OOB at home but does here.    Transfers Overall transfer level: Needs assistance Equipment used: Rolling walker (2 wheeled) Transfers: Sit to/from Stand Sit to Stand: Max assist;From elevated surface         General transfer comment: max A to stand, able to stand for over a minute, side step to head of the bed   Ambulation/Gait             General Gait Details: unable   Stairs             Wheelchair Mobility    Modified Rankin (Stroke Patients Only)       Balance Overall balance assessment: Needs assistance;History of Falls Sitting-balance support: No upper extremity supported;Feet supported Sitting balance-Leahy Scale: Fair     Standing balance support: Bilateral upper extremity supported;During functional  activity Standing balance-Leahy Scale: Zero Standing balance comment: Pt could not clear bottom off bed with max assist                             Cognition Arousal/Alertness: Awake/alert Behavior During Therapy: WFL for tasks assessed/performed Overall Cognitive Status: Within Functional Limits for tasks assessed                                        Exercises      General Comments        Pertinent Vitals/Pain Faces Pain Scale: Hurts whole lot Pain Location: right knee Pain Descriptors / Indicators: Discomfort;Grimacing;Guarding;Aching    Home Living Family/patient expects to be discharged to:: Private residence Living Arrangements: Spouse/significant other Available Help at Discharge: Family;Available 24 hours/day Type of Home: House Home Access: Stairs to enter Entrance Stairs-Rails: None(Pt states that rails are being installed) Home Layout: One level Home Equipment: Environmental consultant - 2 wheels;Walker - standard;Bedside commode;Cane - single point;Wheelchair - manual;Shower seat;Grab bars - tub/shower      Prior Function Level of Independence: Needs assistance  Gait / Transfers Assistance Needed: Pt states she used walker in home and husband helped her prn ADL's / Homemaking Assistance Needed: spouse assisted pt with sponge bathing     PT Goals (current goals can now be found  in the care plan section) Acute Rehab PT Goals Patient Stated Goal: to go home and husband to help PT Goal Formulation: With patient Time For Goal Achievement: 07/28/18 Potential to Achieve Goals: Good    Frequency    Min 3X/week      PT Plan      Co-evaluation              AM-PAC PT "6 Clicks" Mobility   Outcome Measure  Help needed turning from your back to your side while in a flat bed without using bedrails?: A Lot Help needed moving from lying on your back to sitting on the side of a flat bed without using bedrails?: A Lot Help needed moving to and  from a bed to a chair (including a wheelchair)?: Total Help needed standing up from a chair using your arms (e.g., wheelchair or bedside chair)?: Total Help needed to walk in hospital room?: Total Help needed climbing 3-5 steps with a railing? : Total 6 Click Score: 8    End of Session Equipment Utilized During Treatment: Gait belt;Oxygen Activity Tolerance: Patient limited by fatigue Patient left: with call bell/phone within reach;in bed;with nursing/sitter in room Nurse Communication: Mobility status PT Visit Diagnosis: Unsteadiness on feet (R26.81);Muscle weakness (generalized) (M62.81);History of falling (Z91.81)     Time: 1330-1400 PT Time Calculation (min) (ACUTE ONLY): 30 min  Charges:  $Therapeutic Activity: 23-37 mins                     Reinaldo Berber, PT, DPT Acute Rehabilitation Services Pager: [phone] Office: [phone]     Reinaldo Berber 07/15/2018, 1:54 PM

## 2018-07-15 NOTE — TOC Initial Note (Signed)
Transition of Care [facility]) - Initial/Assessment Note    Patient Details  Name: [name] MRN: [id number] Date of Birth: Jul 10, 1945  [facility]) CM/SW Contact:    Benard Halsted, LCSW Phone Number: 07/15/2018, 5:11 PM  Clinical Narrative:                 Patient presents as a 5 yoAA female,FM, AHRF, +BNP, h/o Diastolic HF. +cough, no fevers.Was admitted to ICU briefly intubated, COVID-19 was ruled out.  CSW received consult for possible SNF placement at time of discharge. CSW spoke with patient regarding her mobility status and PT recommendation of SNF placement at time of discharge. Patient reported that patient's spouse is currently able to assist her at home and she does not want to go to SNF. CSW offered home health services but patient reports that she has had home health before and does not feel that she needs it at this time. She stated that she will call her PCP if she changes her mind once she returns home. She states she has all DME (walker, cane, bedside commode) from a previous hospitalization and has home oxygen from [employer] already. She reports her husband will be able to pick her up at discharge and confirmed her address on the Facesheet. She prefers staff to contact her on her cell phone if needed [phone]. No further questions reported at this time. CSW to continue to follow and assist with discharge planning needs.   Expected Discharge Plan: Home/Self Care Barriers to Discharge: Continued Medical Work up   Patient Goals and CMS Choice Patient states their goals for this hospitalization and ongoing recovery are:: Return home   Choice offered to / list presented to : Patient  Expected Discharge Plan and Services Expected Discharge Plan: Home/Self Care In-house Referral: NA Discharge Planning Services: NA Post Acute Care Choice: NA(Refused) Living arrangements for the past 2 months: Single Family Home                 DME Arranged: N/A DME  Agency: NA HH Arranged: Refused SNF, Patient Refused [place] Agency: NA  Prior Living Arrangements/Services Living arrangements for the past 2 months: Single Family Home Lives with:: Spouse Patient language and need for interpreter reviewed:: Yes Do you feel safe going back to the place where you live?: Yes      Need for Family Participation in Patient Care: No (Comment) Care giver support system in place?: Yes (comment) Current home services: DME Criminal Activity/Legal Involvement Pertinent to Current Situation/Hospitalization: No - Comment as needed  Activities of Daily Living      Permission Sought/Granted Permission sought to share information with : Family Supports Permission granted to share information with : Yes, Verbal Permission Granted  Share Information with NAME: Juanda Crumble     Permission granted to share info w Relationship: Spouse  Permission granted to share info w Contact Information: [phone]  Emotional Assessment Appearance:: Appears stated age Attitude/Demeanor/Rapport: Gracious, Engaged Affect (typically observed): Pleasant, Accepting, Appropriate Orientation: : Oriented to Self, Oriented to Place, Oriented to  Time, Oriented to Situation Alcohol / Substance Use: Not Applicable Psych Involvement: No (comment)  Admission diagnosis:  SOB (shortness of breath) [R06.02] Hypoxia [R09.02] Patient Active Problem List   Diagnosis Date Noted  . CHF (congestive heart failure) ([place]) 07/11/2018  . Nausea 05/23/2018  . Leg swelling 11/24/2017  . Macrocytic anemia 06/26/2017  . Bilateral leg weakness 06/26/2017  . Cough 06/26/2017  . S/P AVR 05/30/2017  . Ascending aortic  dissection ([place])   . Chronic diastolic CHF (congestive heart failure) ([place]) 04/19/2017  . Nonrheumatic aortic valve insufficiency 03/10/2017  . Pulmonary HTN ([place]) 03/10/2017  . Neck pain 02/11/2017  . Lobar pneumonia ([place])   . Acute respiratory failure with hypoxia ([place])   . Pericardial  effusion 02/04/2017  . Abnormal bone marrow examination 01/24/2017  . Stenosis of right carotid artery 08/27/2016  . LBBB (left bundle branch block) 08/27/2016  . Left-sided tinnitus 08/16/2016  . Anemia in chronic kidney disease 11/02/2015  . Acute renal failure superimposed on stage 4 chronic kidney disease ([place]) 04/26/2014  . Complicated UTI (urinary tract infection) 04/26/2014  . Diastolic congestive heart failure ([place]) 04/26/2014  . OA (osteoarthritis) of knee 03/23/2012  . Vitamin D deficiency 04/13/2008  . OBESITY 02/23/2008  . Radiculopathy 02/23/2008  . Essential hypertension 02/23/2008  . Venous (peripheral) insufficiency 02/23/2008   PCP:  Hoyt Koch, MD Pharmacy:   [facility] [place], [place] [facility] [address] Phone: [phone] Fax: [phone]     Social Determinants of Health (SDOH) Interventions    Readmission Risk Interventions Readmission Risk Prevention Plan 07/15/2018  Post Dischage Appt Complete  Medication Screening Complete  Transportation Screening Complete  Some recent data might be hidden

## 2018-07-15 NOTE — Progress Notes (Signed)
Bilateral lower extremity venous duplex has been completed. Preliminary results can be found in CV Proc through chart review.   07/15/18 2:58 PM [name] RVT

## 2018-07-15 NOTE — Plan of Care (Signed)

## 2018-07-15 NOTE — Progress Notes (Signed)
Pharmacy Antibiotic Note  [name] is a 73 y.o. female with urinary urgency/freq now with >100K E. faecalis in UCx.  Pharmacy has been consulted for vancomycin dosing. SCr 2.68, CrCl ~ 17 ml/min (BL SCr ~ 2.4)  Plan: Vancomycin 1000mg  IV every 48 hours F/u renal function and UCx to narrow   Height: 5\' 1"  (154.9 cm)(Simultaneous filing. User may not have seen previous data.) Weight: 162 lb 14.7 oz (73.9 kg) IBW/kg (Calculated) : 47.8  Temp (24hrs), Avg:99.8 F (37.7 C), Min:98.9 F (37.2 C), Max:101.1 F (38.4 C)  Recent Labs  Lab 07/11/18 0751 07/11/18 1653 07/12/18 0419 07/13/18 0954 07/15/18 0242  WBC 16.1* 10.4 6.8  --  7.4  CREATININE 2.44* 2.44* 2.33* 2.52* 2.68*    Estimated Creatinine Clearance: 17.4 mL/min (A) (by C-G formula based on SCr of 2.68 mg/dL (H)).    Allergies  Allergen Reactions  . Minoxidil Palpitations and Other (See Comments)    unable to sleep  . Naproxen Swelling    Antimicrobials this admission: Ceftriaxone 4/7>>4/8 vanc 4/8>>  Dose adjustments this admission: n/a  Microbiology results: 4/7 BCx: ngtd 4/7 UCx: >100K E faecalis COVID19 : negative  Bertis Ruddy, PharmD Clinical Pharmacist Please check AMION for all [place] numbers 07/15/2018 3:11 PM

## 2018-07-16 DIAGNOSIS — I10 Essential (primary) hypertension: Secondary | ICD-10-CM

## 2018-07-16 DIAGNOSIS — N184 Chronic kidney disease, stage 4 (severe): Secondary | ICD-10-CM

## 2018-07-16 LAB — GLUCOSE, CAPILLARY
Glucose-Capillary: 112 mg/dL — ABNORMAL HIGH (ref 70–99)
Glucose-Capillary: 126 mg/dL — ABNORMAL HIGH (ref 70–99)
Glucose-Capillary: 131 mg/dL — ABNORMAL HIGH (ref 70–99)

## 2018-07-16 LAB — CBC
HCT: 28.4 % — ABNORMAL LOW (ref 36.0–46.0)
Hemoglobin: 9 g/dL — ABNORMAL LOW (ref 12.0–15.0)
MCH: 28.8 pg (ref 26.0–34.0)
MCHC: 31.7 g/dL (ref 30.0–36.0)
MCV: 90.7 fL (ref 80.0–100.0)
Platelets: 214 10*3/uL (ref 150–400)
RBC: 3.13 MIL/uL — ABNORMAL LOW (ref 3.87–5.11)
RDW: 17.1 % — ABNORMAL HIGH (ref 11.5–15.5)
WBC: 7.7 10*3/uL (ref 4.0–10.5)
nRBC: 0 % (ref 0.0–0.2)

## 2018-07-16 LAB — BASIC METABOLIC PANEL
Anion gap: 12 (ref 5–15)
BUN: 53 mg/dL — ABNORMAL HIGH (ref 8–23)
CO2: 28 mmol/L (ref 22–32)
Calcium: 9.9 mg/dL (ref 8.9–10.3)
Chloride: 100 mmol/L (ref 98–111)
Creatinine, Ser: 2.66 mg/dL — ABNORMAL HIGH (ref 0.44–1.00)
GFR calc Af Amer: 20 mL/min — ABNORMAL LOW (ref >60)
GFR calc non Af Amer: 17 mL/min — ABNORMAL LOW (ref >60)
Glucose, Bld: 142 mg/dL — ABNORMAL HIGH (ref 70–99)
Potassium: 3.5 mmol/L (ref 3.5–5.1)
Sodium: 140 mmol/L (ref 135–145)

## 2018-07-16 LAB — URINE CULTURE: Culture: >=100000 — AB

## 2018-07-16 MED ORDER — FLUTICASONE PROPIONATE 50 MCG/ACT NA SUSP: 2.0000 | Freq: Every day | NASAL | Status: AC | PRN

## 2018-07-16 MED ORDER — ONDANSETRON HCL 4 MG PO TABS: 4.0000 mg | ORAL_TABLET | Freq: Three times a day (TID) | ORAL | 0 refills | Status: AC | PRN

## 2018-07-16 MED ORDER — CARVEDILOL 25 MG PO TABS: 25.0000 mg | ORAL_TABLET | Freq: Two times a day (BID) | ORAL | Status: AC

## 2018-07-16 MED ORDER — SPIRONOLACTONE 25 MG PO TABS: 25.0000 mg | ORAL_TABLET | Freq: Every day | ORAL | 0 refills | Status: DC

## 2018-07-16 MED ORDER — AMOXICILLIN 500 MG PO TABS: 500.0000 mg | ORAL_TABLET | Freq: Two times a day (BID) | ORAL | 0 refills | Status: AC

## 2018-07-16 NOTE — TOC Transition Note (Signed)
Transition of Care [facility]) - CM/SW Discharge Note   Patient Details  Name: [name] MRN: [id number] Date of Birth: 13-Jul-1945  [facility]) CM/SW Contact:  Benard Halsted, LCSW Phone Number: 07/16/2018, 9:10 AM   Clinical Narrative:    Patient to discharge home today. CSW/RNCM confirmed patient refusing home health services and her spouse will pick her up.    Final next level of care: Home/Self Care Barriers to Discharge: No Barriers Identified   Patient Goals and CMS Choice Patient states their goals for this hospitalization and ongoing recovery are:: Return home   Choice offered to / list presented to : Patient  Discharge Placement                       Discharge Plan and Services In-house Referral: NA Discharge Planning Services: NA Post Acute Care Choice: NA(Refused)          DME Arranged: N/A DME Agency: NA HH Arranged: Refused SNF, Patient Refused [place] Agency: NA   Social Determinants of Health (SDOH) Interventions     Readmission Risk Interventions Readmission Risk Prevention Plan 07/15/2018  Post Dischage Appt Complete  Medication Screening Complete  Transportation Screening Complete  Some recent data might be hidden

## 2018-07-16 NOTE — Discharge Summary (Signed)
Physician Discharge Summary  [name] GYB:[id number] DOB: 09-Jan-1946 DOA: 07/11/2018  PCP: Hoyt Koch, MD  Admit date: 07/11/2018 Discharge date: 07/16/2018  Admitted From: Home Disposition: Home  Recommendations for Outpatient Follow-up:  1. Follow up with PCP in 1 week with repeat CBC/BMP 2. Outpatient follow-up with cardiology and nephrology 3. Follow up in ED if symptoms worsen or new appear   Home Health: Home health RN/PT/OT.  Patient refused SNF placement Equipment/Devices: None  Discharge Condition: Guarded CODE STATUS: Full Diet recommendation: Heart healthy/fluid restriction of up to 1500 cc a day  Brief/Interim Summary: 73 year old female with history of chronic diastolic heart failure, chronic hypoxic respiratory failure, chronic kidney disease stage IV, hypertension, severe AI status post bioprosthetic AVR, ascending thoracic aortic dissection status post graft was admitted to ICU with acute on chronic hypoxic respiratory failure and was briefly intubated, COVID-19 was ruled out, she was diuresed, extubated and transferred to [facility] service on 07/14/2018.  She has diuresed well.  She developed fever probably secondary to UTI and was started on antibiotics.  She is very deconditioned and PT recommended SNF placement.  Patient refused.  She will be discharged home with home health PT/RN on oral antibiotics.  Discharge Diagnoses:  Active Problems:   CHF (congestive heart failure) (HCC)  Acute on chronic hypoxic respiratory failure -Probably from CHF exacerbation -Was briefly intubated in the ICU.  Currently extubated.  Uses oxygen via nasal cannula 2 L/min at home at night. -Respiratory status is currently stable -COVID-19 was ruled out.  CT chest was not convincing for pulmonary infection.  Acute on chronic diastolic heart failure -Improved.  Treated with diuretics along with spironolactone.  Negative balance of 5798.2 cc since admission. -Volume status is  improved.  Outpatient follow-up with cardiology.  Discharge home on Lasix 160 mg p.o. twice daily along with spironolactone 25 mg daily.  Outpatient follow-up with cardiology.  Close monitoring of BMP as an outpatient. -Continue Coreg  Presumed UTI with Enterococcus faecalis causing fever -Patient was empirically started on Rocephin.  Urine culture grew E faecalis; antibiotics changed to vancomycin.  We will switch to oral amoxicillin for 5 days.  Currently afebrile.  Chronic renal disease stage IV -Renal function stable.  Outpatient follow-up with Dr. Jimmy Footman  Anemia of chronic disease--hemoglobin stable.  Outpatient follow-up  History of severe AR status post bioprosthetic AVR/ascending thoracic aortic dissection status post graft -Continue aspirin.  Outpatient follow-up with CT surgery  Bilateral knee pain/osteoarthritis-outpatient follow-up with orthopedics   Discharge Instructions  Discharge Instructions    (HEART FAILURE PATIENTS) Call MD:  Anytime you have any of the following symptoms: 1) 3 pound weight gain in 24 hours or 5 pounds in 1 week 2) shortness of breath, with or without a dry hacking cough 3) swelling in the hands, feet or stomach 4) if you have to sleep on extra pillows at night in order to breathe.   Complete by:  As directed    Ambulatory referral to Cardiology   Complete by:  As directed    CHF exacerbation followup   Call MD for:  difficulty breathing, headache or visual disturbances   Complete by:  As directed    Call MD for:  extreme fatigue   Complete by:  As directed    Diet - low sodium heart healthy   Complete by:  As directed    Increase activity slowly   Complete by:  As directed      Allergies as of 07/16/2018  Reactions   Minoxidil Palpitations, Other (See Comments)   unable to sleep   Naproxen Swelling      Medication List    STOP taking these medications   baclofen 10 MG tablet Commonly known as:  LIORESAL   cyclobenzaprine 5 MG  tablet Commonly known as:  FLEXERIL   Diclofenac Sodium 2 % Soln Commonly known as:  Pennsaid   lidocaine 5 % Commonly known as:  Lidoderm   pantoprazole 40 MG tablet Commonly known as:  PROTONIX   traMADol 50 MG tablet Commonly known as:  ULTRAM     TAKE these medications   amLODipine 5 MG tablet Commonly known as:  NORVASC Take 5 mg by mouth at bedtime.   amoxicillin 500 MG tablet Commonly known as:  AMOXIL Take 1 tablet (500 mg total) by mouth 2 (two) times daily for 5 days.   aspirin EC 81 MG tablet Take 81 mg by mouth daily.   calcitRIOL 0.25 MCG capsule Commonly known as:  ROCALTROL Take 1 capsule (0.25 mcg total) by mouth every other day. Please take every Monday, Wednesday, Friday   calcium carbonate 500 MG chewable tablet Commonly known as:  TUMS - dosed in mg elemental calcium Chew 2 tablets by mouth daily as needed for indigestion or heartburn.   carvedilol 25 MG tablet Commonly known as:  COREG Take 1 tablet (25 mg total) by mouth 2 (two) times daily with a meal. What changed:  See the new instructions.   FAMOTIDINE PO Take 1 tablet by mouth 2 (two) times daily as needed (gas).   fluticasone 50 MCG/ACT nasal spray Commonly known as:  FLONASE Place 2 sprays into both nostrils daily as needed for allergies.   furosemide 80 MG tablet Commonly known as:  LASIX Take 160 mg by mouth 2 (two) times daily.   gabapentin 300 MG capsule Commonly known as:  NEURONTIN TAKE 1 CAPSULE(300 MG) BY MOUTH THREE TIMES DAILY AS NEEDED FOR PAIN What changed:    how much to take  when to take this  reasons to take this  additional instructions   hydrocortisone 2.5 % rectal cream Commonly known as:  ANUSOL-HC Place 1 application rectally as needed.   ondansetron 4 MG tablet Commonly known as:  Zofran Take 1 tablet (4 mg total) by mouth every 8 (eight) hours as needed for nausea or vomiting.   potassium chloride SA 20 MEQ tablet Commonly known as:   K-DUR,KLOR-CON Take 20 mEq by mouth daily.   spironolactone 25 MG tablet Commonly known as:  ALDACTONE Take 1 tablet (25 mg total) by mouth daily.   Systane Balance 0.6 % Soln Generic drug:  Propylene Glycol Place 1 drop into both eyes daily as needed (for dry eyes).   triamcinolone ointment 0.5 % Commonly known as:  KENALOG Apply 1 application topically daily as needed (itching).      Follow-up Information    Hoyt Koch, MD. Schedule an appointment as soon as possible for a visit in 1 week(s).   Specialty:  Internal Medicine Why:  With repeat CBC/BMP Contact information: [place] [address] [phone]        Minus Breeding, MD .   Specialty:  Cardiology Contact information: [address] [place] [address] [phone]        Mauricia Area, MD. Schedule an appointment as soon as possible for a visit in 1 week(s).   Specialty:  Nephrology Contact information: [address] [place] [address]  [phone]          Allergies  Allergen Reactions  . Minoxidil Palpitations and Other (See Comments)    unable to sleep  . Naproxen Swelling    Consultations:  PCCM   Procedures/Studies: Ct Chest Wo Contrast  Result Date: 07/14/2018 CLINICAL DATA:  Follow-up for cough and shortness of breath and pneumonia. EXAM: CT CHEST WITHOUT CONTRAST TECHNIQUE: Multidetector CT imaging of the chest was performed following the standard protocol without IV contrast. COMPARISON:  Chest radiographs, most recent dated 07/11/2018 FINDINGS: Cardiovascular: Heart mildly enlarged. Three-vessel coronary artery calcifications. No pericardial effusion. There changes from prior cardiac surgery and aortic valve replacement. There are calcifications displaced away from the intima the thoracic aorta from the arch through the descending portion consistent with a chronic dissection. Aortic arch is dilated to 4.4 cm. Mediastinum/Nodes: No  neck base or axillary masses or enlarged lymph nodes. There are multiple subcentimeter mediastinal and hilar lymph nodes, but no masses or pathologically enlarged lymph nodes. Trachea and esophagus are unremarkable. Lungs/Pleura: Small right and trace left pleural effusions. There is dependent opacity in the lower lobes consistent with atelectasis. There are additional linear opacities in the mid and lower lungs also consistent with atelectasis. There is no convincing pneumonia. There is no evidence of pulmonary edema. No pneumothorax. Upper Abdomen: No acute findings. Musculoskeletal: No fracture or acute finding. No osteoblastic or osteolytic lesions. IMPRESSION: 1. No convincing pneumonia. Small right and minimal left pleural effusions. Dependent lower lobe opacity most consistent with atelectasis with additional areas of linear atelectasis in the mid to lower lungs. No pulmonary edema. 2. Stable cardiomegaly and changes from prior cardiac surgery. 3. Chronic dissection of the thoracoabdominal aorta, assessment limited by lack of intravenous contrast. Aortic Atherosclerosis (ICD10-I70.0). Electronically Signed   By: Lajean Manes M.D.   On: 07/14/2018 10:46   Dg Chest Port 1 View  Result Date: 07/15/2018 CLINICAL DATA:  Fever 5 days with cough.  UTI. EXAM: PORTABLE CHEST 1 VIEW COMPARISON:  07/11/2018 FINDINGS: Interval removal of endotracheal tube and nasogastric tubes. Prosthetic aortic valve unchanged. Lungs are adequately inflated with minimal left base opacification likely small amount of left pleural fluid and associated atelectasis although infection is possible. Stable cardiomegaly. Remainder of the exam is unchanged. IMPRESSION: Mild left base opacification likely small effusion with atelectasis, although infection is possible. Stable cardiomegaly. Electronically Signed   By: Marin Olp M.D.   On: 07/15/2018 08:20   Dg Chest Port 1 View  Result Date: 07/11/2018 CLINICAL DATA:  CHF. EXAM:  PORTABLE CHEST 1 VIEW COMPARISON:  Earlier today FINDINGS: Endotracheal tube tip is 1 cm above the carina. The orogastric tube reaches the stomach at least. Borderline improvement in aeration. There is still airspace opacity at the bases with probable small pleural effusions. No visible pneumothorax, limited by chin positioning. Cardiomegaly.  Aortic valve replacement IMPRESSION: 1. Retracted endotracheal tube with tip 1 cm above the carina. 2. Equivocal improvement in pulmonary edema. There is still airspace disease at the bases. Electronically Signed   By: Monte Fantasia M.D.   On: 07/11/2018 13:21   Dg Chest Portable 1 View  Result Date: 07/11/2018 CLINICAL DATA:  Respiratory distress.  Patient intubated. EXAM: PORTABLE CHEST 1 VIEW COMPARISON:  09/03/2017 FINDINGS: There changes from cardiac surgery and valve replacement. Cardiac silhouette is mildly enlarged. No mediastinal or hilar masses. There is central vascular congestion. Hazy perihilar and lower lung zone airspace opacities are noted. There are small bilateral pleural effusions. No convincing pneumothorax.  Endotracheal tube tip projects at the [place]. Nasal/orogastric tube passes well below the diaphragm into the stomach. IMPRESSION: 1. Findings consistent with congestive heart failure with perihilar and lower lung zone airspace pulmonary edema and small bilateral pleural effusions. 2. Endotracheal tube tip projects at the [place]. Consider retracting 1-2 cm for more optimal positioning. 3. Well-positioned nasal/orogastric tube. Electronically Signed   By: Lajean Manes M.D.   On: 07/11/2018 09:15   Vas [place] Lower Extremity Venous (dvt)  Result Date: 07/15/2018  Lower Venous Study Indications: Swelling.  Performing Technologist: Oliver Hum RVT  Examination Guidelines: A complete evaluation includes B-mode imaging, spectral Doppler, color Doppler, and power Doppler as needed of all accessible portions of each vessel. Bilateral testing is  considered an integral part of a complete examination. Limited examinations for reoccurring indications may be performed as noted.  Right Venous Findings: +---------+---------------+---------+-----------+----------+-------+          CompressibilityPhasicitySpontaneityPropertiesSummary +---------+---------------+---------+-----------+----------+-------+ CFV      Full           Yes      Yes                          +---------+---------------+---------+-----------+----------+-------+ SFJ      Full                                                 +---------+---------------+---------+-----------+----------+-------+ FV Prox  Full                                                 +---------+---------------+---------+-----------+----------+-------+ FV Mid   Full                                                 +---------+---------------+---------+-----------+----------+-------+ FV DistalFull                                                 +---------+---------------+---------+-----------+----------+-------+ PFV      Full                                                 +---------+---------------+---------+-----------+----------+-------+ POP      Full           Yes      Yes                          +---------+---------------+---------+-----------+----------+-------+ PTV      Full                                                 +---------+---------------+---------+-----------+----------+-------+ PERO     Full                                                 +---------+---------------+---------+-----------+----------+-------+  Left Venous Findings: +---------+---------------+---------+-----------+----------+-------+          CompressibilityPhasicitySpontaneityPropertiesSummary +---------+---------------+---------+-----------+----------+-------+ CFV      Full           Yes      Yes                           +---------+---------------+---------+-----------+----------+-------+ SFJ      Full                                                 +---------+---------------+---------+-----------+----------+-------+ FV Prox  Full                                                 +---------+---------------+---------+-----------+----------+-------+ FV Mid   Full                                                 +---------+---------------+---------+-----------+----------+-------+ FV DistalFull                                                 +---------+---------------+---------+-----------+----------+-------+ PFV      Full                                                 +---------+---------------+---------+-----------+----------+-------+ POP      Full           Yes      Yes                          +---------+---------------+---------+-----------+----------+-------+ PTV      Full                                                 +---------+---------------+---------+-----------+----------+-------+ PERO     Full                                                 +---------+---------------+---------+-----------+----------+-------+    Summary: Right: There is no evidence of deep vein thrombosis in the lower extremity. No cystic structure found in the popliteal fossa. Left: There is no evidence of deep vein thrombosis in the lower extremity. No cystic structure found in the popliteal fossa.  *See table(s) above for measurements and observations. Electronically signed by Curt Jews MD on 07/15/2018 at 3:38:10 PM.    Final        Subjective: Patient seen and examined at bedside.  She complains of mild nausea.  No worsening shortness of breath, cough or fever.  Feels okay to go  home.  Discharge Exam: Vitals:   07/15/18 2051 07/16/18 0442  BP: (!) 149/87 (!) 150/87  Pulse: 90 78  Resp: 20 20  Temp: 98.2 F (36.8 C) 97.6 F (36.4 C)  SpO2: 96% 99%    General: Pt is alert, awake,  not in acute distress.  Looks deconditioned Cardiovascular: rate controlled, S1/S2 + Respiratory: bilateral decreased breath sounds at bases with scattered crackles mostly in the bases Abdominal: Soft, NT, ND, bowel sounds + Extremities: Bilateral lower extremity mild pitting edema, no cyanosis    The results of significant diagnostics from this hospitalization (including imaging, microbiology, ancillary and laboratory) are listed below for reference.     Microbiology: Recent Results (from the past 240 hour(s))  Novel Coronavirus, NAA (hospital order; send-out to ref lab)     Status: None   Collection Time: 07/11/18 10:30 AM  Result Value Ref Range Status   SARS-CoV-2, NAA NOT DETECTED NOT DETECTED Final    Comment: Performed at [facility]  Final    Comment: Performed at [facility], [place] [address], [place], [place] [address]  MRSA PCR Screening     Status: None   Collection Time: 07/11/18  3:53 PM  Result Value Ref Range Status   MRSA by PCR NEGATIVE NEGATIVE Final    Comment:        The GeneXpert MRSA Assay (FDA approved for NASAL specimens only), is one component of a comprehensive MRSA colonization surveillance program. It is not intended to diagnose MRSA infection nor to guide or monitor treatment for MRSA infections. Performed at [facility], [place] [address], [place], [place] [address]   Respiratory Panel by PCR     Status: None   Collection Time: 07/12/18  1:00 PM  Result Value Ref Range Status   Adenovirus NOT DETECTED NOT DETECTED Final   Coronavirus 229E NOT DETECTED NOT DETECTED Final    Comment: (NOTE) The Coronavirus on the Respiratory Panel, DOES NOT test for the novel  Coronavirus (2019 nCoV)    Coronavirus HKU1 NOT DETECTED NOT DETECTED Final   Coronavirus NL63 NOT DETECTED NOT DETECTED Final   Coronavirus OC43 NOT DETECTED NOT DETECTED Final   Metapneumovirus NOT DETECTED NOT DETECTED Final    Rhinovirus / Enterovirus NOT DETECTED NOT DETECTED Final   Influenza A NOT DETECTED NOT DETECTED Final   Influenza B NOT DETECTED NOT DETECTED Final   Parainfluenza Virus 1 NOT DETECTED NOT DETECTED Final   Parainfluenza Virus 2 NOT DETECTED NOT DETECTED Final   Parainfluenza Virus 3 NOT DETECTED NOT DETECTED Final   Parainfluenza Virus 4 NOT DETECTED NOT DETECTED Final   Respiratory Syncytial Virus NOT DETECTED NOT DETECTED Final   Bordetella pertussis NOT DETECTED NOT DETECTED Final   Chlamydophila pneumoniae NOT DETECTED NOT DETECTED Final   Mycoplasma pneumoniae NOT DETECTED NOT DETECTED Final    Comment: Performed at [facility], [place]. [address], [place], [place] [address]  Culture, blood (routine x 2)     Status: None (Preliminary result)   Collection Time: 07/14/18  1:11 AM  Result Value Ref Range Status   Specimen Description BLOOD LEFT HAND  Final   Special Requests   Final    BOTTLES DRAWN AEROBIC AND ANAEROBIC Blood Culture results may not be optimal due to an excessive volume of blood received in culture bottles   Culture   Final    NO GROWTH 2 DAYS Performed at [facility],  [address], [place], [place] [address]    Report Status PENDING  Incomplete  Culture, blood (routine x 2)     Status: None (Preliminary result)   Collection Time: 07/14/18  1:11 AM  Result Value Ref Range Status   Specimen Description BLOOD RIGHT HAND  Final   Special Requests   Final    BOTTLES DRAWN AEROBIC AND ANAEROBIC Blood Culture results may not be optimal due to an excessive volume of blood received in culture bottles   Culture   Final    NO GROWTH 2 DAYS Performed at [facility], [place] [address], [place], [place] [address]    Report Status PENDING  Incomplete  Culture, Urine     Status: Abnormal   Collection Time: 07/14/18 12:52 PM  Result Value Ref Range Status   Specimen Description URINE, CATHETERIZED  Final   Special Requests   Final    NONE Performed at  [facility], [place] [address], [place], [place] [address]    Culture >=100,000 COLONIES/mL ENTEROCOCCUS FAECALIS (A)  Final   Report Status 07/16/2018 FINAL  Final   Organism ID, Bacteria ENTEROCOCCUS FAECALIS (A)  Final      Susceptibility   Enterococcus faecalis - MIC*    AMPICILLIN <=2 SENSITIVE Sensitive     LEVOFLOXACIN 1 SENSITIVE Sensitive     NITROFURANTOIN <=16 SENSITIVE Sensitive     VANCOMYCIN 1 SENSITIVE Sensitive     * >=100,000 COLONIES/mL ENTEROCOCCUS FAECALIS     Labs: BNP (last 3 results) Recent Labs    07/11/18 0751 07/14/18 0930  BNP 2,661.4* 7,425.9*   Basic Metabolic Panel: Recent Labs  Lab 07/11/18 0751  07/11/18 1653 07/12/18 0419 07/12/18 0441 07/12/18 1017 07/13/18 0954 07/15/18 0242 07/16/18 0222  NA 140   < >  --  141 143 142 143 138 140  K 3.5   < >  --  3.0* 2.8* 2.8* 4.0 2.9* 3.5  CL 101  --   --  101  --   --  104 102 100  CO2 25  --   --  29  --   --  28 27 28   GLUCOSE 226*  --   --  99  --   --  124* 96 142*  BUN 50*  --   --  52*  --   --  52* 52* 53*  CREATININE 2.44*  --  2.44* 2.33*  --   --  2.52* 2.68* 2.66*  CALCIUM 9.7  --   --  9.6  --   --  9.4 9.6 9.9  MG  --   --   --  2.3  --   --  2.1 2.1  --   PHOS  --   --   --  3.7  --   --   --   --   --    < > = values in this interval not displayed.   Liver Function Tests: Recent Labs  Lab 07/11/18 0751 07/15/18 0242  AST 26 19  ALT 16 12  ALKPHOS 79 56  BILITOT 0.9 0.6  PROT 7.6 6.7  ALBUMIN 3.7 2.8*   No results for input(s): LIPASE, AMYLASE in the last 168 hours. No results for input(s): AMMONIA in the last 168 hours. CBC: Recent Labs  Lab 07/11/18 0751  07/11/18 1653 07/12/18 0419 07/12/18 0441 07/12/18 1017 07/15/18 0242 07/16/18 0222  WBC 16.1*  --  10.4 6.8  --   --  7.4 7.7  NEUTROABS 13.7*  --   --   --   --   --  5.2  --   HGB 9.9*   < > 8.5* 9.7* 10.2* 9.5* 8.4* 9.0*  HCT 33.0*   < > 27.8* 30.6* 30.0* 28.0* 26.5* 28.4*  MCV 94.0  --  91.1 91.3   --   --  91.1 90.7  PLT 267  --  186 172  --   --  187 214   < > = values in this interval not displayed.   Cardiac Enzymes: Recent Labs  Lab 07/11/18 0751  TROPONINI 0.04*   BNP: Invalid input(s): POCBNP CBG: Recent Labs  Lab 07/15/18 1647 07/15/18 2031 07/16/18 0009 07/16/18 0418 07/16/18 0800  GLUCAP 98 114* 131* 126* 112*   D-Dimer Recent Labs    07/14/18 1052  DDIMER 4.61*   Hgb A1c No results for input(s): HGBA1C in the last 72 hours. Lipid Profile No results for input(s): CHOL, HDL, LDLCALC, TRIG, CHOLHDL, LDLDIRECT in the last 72 hours. Thyroid function studies No results for input(s): TSH, T4TOTAL, T3FREE, THYROIDAB in the last 72 hours.  Invalid input(s): FREET3 Anemia work up Recent Labs    07/14/18 1052  FERRITIN 1,059*   Urinalysis    Component Value Date/Time   COLORURINE YELLOW 07/14/2018 1010   APPEARANCEUR HAZY (A) 07/14/2018 1010   LABSPEC 1.010 07/14/2018 1010   PHURINE 5.0 07/14/2018 1010   GLUCOSEU NEGATIVE 07/14/2018 1010   GLUCOSEU NEGATIVE 12/08/2015 1024   HGBUR SMALL (A) 07/14/2018 1010   BILIRUBINUR NEGATIVE 07/14/2018 1010   BILIRUBINUR Neg 05/22/2018 1505   KETONESUR NEGATIVE 07/14/2018 1010   PROTEINUR NEGATIVE 07/14/2018 1010   UROBILINOGEN 0.2 05/22/2018 1505   UROBILINOGEN 0.2 12/08/2015 1024   NITRITE NEGATIVE 07/14/2018 1010   LEUKOCYTESUR MODERATE (A) 07/14/2018 1010   Sepsis Labs Invalid input(s): PROCALCITONIN,  WBC,  LACTICIDVEN Microbiology Recent Results (from the past 240 hour(s))  Novel Coronavirus, NAA (hospital order; send-out to ref lab)     Status: None   Collection Time: 07/11/18 10:30 AM  Result Value Ref Range Status   SARS-CoV-2, NAA NOT DETECTED NOT DETECTED Final    Comment: Performed at [facility]  Final    Comment: Performed at [facility], [place] [address] [place], [place] [address]  MRSA PCR Screening     Status: None   Collection Time:  07/11/18  3:53 PM  Result Value Ref Range Status   MRSA by PCR NEGATIVE NEGATIVE Final    Comment:        The GeneXpert MRSA Assay (FDA approved for NASAL specimens only), is one component of a comprehensive MRSA colonization surveillance program. It is not intended to diagnose MRSA infection nor to guide or monitor treatment for MRSA infections. Performed at [facility], [place] [address] [place], [place] [address]   Respiratory Panel by PCR     Status: None   Collection Time: 07/12/18  1:00 PM  Result Value Ref Range Status   Adenovirus NOT DETECTED NOT DETECTED Final   Coronavirus 229E NOT DETECTED NOT DETECTED Final    Comment: (NOTE) The Coronavirus on the Respiratory Panel, DOES NOT test for the novel  Coronavirus (2019 nCoV)    Coronavirus HKU1 NOT DETECTED NOT DETECTED Final   Coronavirus NL63 NOT DETECTED NOT DETECTED Final   Coronavirus OC43 NOT DETECTED NOT DETECTED Final   Metapneumovirus NOT DETECTED NOT DETECTED Final  Rhinovirus / Enterovirus NOT DETECTED NOT DETECTED Final   Influenza A NOT DETECTED NOT DETECTED Final   Influenza B NOT DETECTED NOT DETECTED Final   Parainfluenza Virus 1 NOT DETECTED NOT DETECTED Final   Parainfluenza Virus 2 NOT DETECTED NOT DETECTED Final   Parainfluenza Virus 3 NOT DETECTED NOT DETECTED Final   Parainfluenza Virus 4 NOT DETECTED NOT DETECTED Final   Respiratory Syncytial Virus NOT DETECTED NOT DETECTED Final   Bordetella pertussis NOT DETECTED NOT DETECTED Final   Chlamydophila pneumoniae NOT DETECTED NOT DETECTED Final   Mycoplasma pneumoniae NOT DETECTED NOT DETECTED Final    Comment: Performed at [facility], [place] [address], [place], [place] [address]  Culture, blood (routine x 2)     Status: None (Preliminary result)   Collection Time: 07/14/18  1:11 AM  Result Value Ref Range Status   Specimen Description BLOOD LEFT HAND  Final   Special Requests   Final    BOTTLES DRAWN AEROBIC AND ANAEROBIC Blood  Culture results may not be optimal due to an excessive volume of blood received in culture bottles   Culture   Final    NO GROWTH 2 DAYS Performed at [facility], [place] [address], [place], [place] [address]    Report Status PENDING  Incomplete  Culture, blood (routine x 2)     Status: None (Preliminary result)   Collection Time: 07/14/18  1:11 AM  Result Value Ref Range Status   Specimen Description BLOOD RIGHT HAND  Final   Special Requests   Final    BOTTLES DRAWN AEROBIC AND ANAEROBIC Blood Culture results may not be optimal due to an excessive volume of blood received in culture bottles   Culture   Final    NO GROWTH 2 DAYS Performed at [facility], [place] [address], [place],  [address]    Report Status PENDING  Incomplete  Culture, Urine     Status: Abnormal   Collection Time: 07/14/18 12:52 PM  Result Value Ref Range Status   Specimen Description URINE, CATHETERIZED  Final   Special Requests   Final    NONE Performed at [facility], [place] [address], [place], [place] [address]    Culture >=100,000 COLONIES/mL ENTEROCOCCUS FAECALIS (A)  Final   Report Status 07/16/2018 FINAL  Final   Organism ID, Bacteria ENTEROCOCCUS FAECALIS (A)  Final      Susceptibility   Enterococcus faecalis - MIC*    AMPICILLIN <=2 SENSITIVE Sensitive     LEVOFLOXACIN 1 SENSITIVE Sensitive     NITROFURANTOIN <=16 SENSITIVE Sensitive     VANCOMYCIN 1 SENSITIVE Sensitive     * >=100,000 COLONIES/mL ENTEROCOCCUS FAECALIS     Time coordinating discharge: 35 minutes  SIGNED:   Aline August, MD  [facility] 07/16/2018, 8:38 AM

## 2018-07-16 NOTE — Progress Notes (Signed)
Physical Therapy Treatment Patient Details Name: [name] MRN: [id number] DOB: 03/01/1946 Today's Date: 07/16/2018    History of Present Illness 73 year old woman with a history of diastolic heart failure and CKD. Presents with 3-day worsening shortness of breath without chest pain. No history of fever. Associated cough. Brought in by EMS for respiratory distress. No sick contacts.    PT Comments    Patient progressing today, requiring less assistance for standing and able to pivot transfer to chair today with Min A. Still rec SNF but patient has declined and states she has support at home from husband. HHPT would be next best option for this patient.     Follow Up Recommendations  SNF;Supervision/Assistance - 24 hour (pt declines SNF, HHPT next best option)      Equipment Recommendations  None recommended by PT    Recommendations for Other Services       Precautions / Restrictions Precautions Precautions: Fall Restrictions Weight Bearing Restrictions: No    Mobility  Bed Mobility Overal bed mobility: Needs Assistance Bed Mobility: Supine to Sit     Supine to sit: Mod assist     General bed mobility comments: Incr time to come to EOB.  She states that she didnt need assist to get OOB at home but does here.    Transfers Overall transfer level: Needs assistance Equipment used: 1 person hand held assist Transfers: Sit to/from [facility] Sit to Stand: Max assist;From elevated surface         General transfer comment: stood EOB today with min A, SPT to chair with min A  Ambulation/Gait             General Gait Details: unable   Stairs             Wheelchair Mobility    Modified Rankin (Stroke Patients Only)       Balance Overall balance assessment: Needs assistance;History of Falls Sitting-balance support: No upper extremity supported;Feet supported Sitting balance-Leahy Scale: Fair     Standing balance support:  Bilateral upper extremity supported;During functional activity Standing balance-Leahy Scale: Zero Standing balance comment: Pt could not clear bottom off bed with max assist                             Cognition Arousal/Alertness: Awake/alert Behavior During Therapy: WFL for tasks assessed/performed Overall Cognitive Status: Within Functional Limits for tasks assessed                                        Exercises      General Comments        Pertinent Vitals/Pain Faces Pain Scale: Hurts whole lot Pain Location: right knee Pain Descriptors / Indicators: Discomfort;Grimacing;Guarding;Aching    Home Living                      Prior Function            PT Goals (current goals can now be found in the care plan section) Acute Rehab PT Goals Patient Stated Goal: to go home and husband to help PT Goal Formulation: With patient Time For Goal Achievement: 07/28/18 Potential to Achieve Goals: Good    Frequency    Min 3X/week      PT Plan      Co-evaluation  AM-PAC PT "6 Clicks" Mobility   Outcome Measure  Help needed turning from your back to your side while in a flat bed without using bedrails?: A Lot Help needed moving from lying on your back to sitting on the side of a flat bed without using bedrails?: A Lot Help needed moving to and from a bed to a chair (including a wheelchair)?: Total Help needed standing up from a chair using your arms (e.g., wheelchair or bedside chair)?: Total Help needed to walk in hospital room?: Total Help needed climbing 3-5 steps with a railing? : Total 6 Click Score: 8    End of Session Equipment Utilized During Treatment: Gait belt;Oxygen Activity Tolerance: Patient limited by fatigue Patient left: with call bell/phone within reach;in bed;with nursing/sitter in room Nurse Communication: Mobility status PT Visit Diagnosis: Unsteadiness on feet (R26.81);Muscle weakness  (generalized) (M62.81);History of falling (Z91.81)     Time: 0900-0930 PT Time Calculation (min) (ACUTE ONLY): 30 min  Charges:  $Therapeutic Activity: 8-22 mins                     Reinaldo Berber, PT, DPT Acute Rehabilitation Services Pager: [phone] Office: [phone]     Reinaldo Berber 07/16/2018, 9:45 AM

## 2018-07-16 NOTE — Progress Notes (Signed)
Pt given discharge instructions, prescriptions, and care notes. Pt verbalized understanding AEB no further questions or concerns at this time. IV was discontinued, no redness, pain, or swelling noted at this time. Telemetry discontinued and Centralized Telemetry was notified. Pt left the floor via wheelchair with staff in stable condition. 

## 2018-07-19 LAB — CULTURE, BLOOD (ROUTINE X 2)
Culture: NO GROWTH
Culture: NO GROWTH

## 2018-07-20 DIAGNOSIS — I5033 Acute on chronic diastolic (congestive) heart failure: Secondary | ICD-10-CM | POA: Diagnosis not present

## 2018-07-20 DIAGNOSIS — I5030 Unspecified diastolic (congestive) heart failure: Secondary | ICD-10-CM | POA: Diagnosis not present

## 2018-07-21 ENCOUNTER — Other Ambulatory Visit: Payer: Self-pay

## 2018-07-21 NOTE — Consult Note (Signed)
Follow up:  Spoke with patient via telephone [phone], HIPAA verified patient's name and number.  Explained that [facility] is a covered benefit of insurance. Review information for [facility] Care Management.  Explained that [facility] does not interfere with or replace any services arranged by the inpatient care management staff.  Patient declined services with [facility] at this time. Patient states, "I still have the folder with your information and I have done well since I  returned home. I will call you all if I feel I have any needs." For questions, please contact:  Natividad Brood, RN BSN [facility] Liaison  [phone] business mobile phone Toll free office [phone]

## 2018-08-07 ENCOUNTER — Telehealth: Payer: Self-pay | Admitting: Cardiology

## 2018-08-07 NOTE — Telephone Encounter (Signed)
New Message:    Patient stating she not to long got out of the hospital and need to see the doctor in person sooner. Please call patient.

## 2018-08-07 NOTE — Telephone Encounter (Signed)
Attempted to contact patient, no answer.  LVM with call back number.

## 2018-08-07 NOTE — Telephone Encounter (Signed)
Patient was discharged from hospital on 4/9 and they suggested she follow up with Dr.Hochrein, patient would like a virtual visit appointment with him as soon as she could due to her follow up appointment was past due.   I advised patient I would route a message to his staff to get her scheduled.  Patient had no questions or concerns at this time, states she is feeling fine- but hospital stated she needed a follow up.

## 2018-08-07 NOTE — Telephone Encounter (Signed)
Patient is returning call.  °

## 2018-08-07 NOTE — Telephone Encounter (Signed)
Appointment schedule for 05/04

## 2018-08-09 NOTE — Progress Notes (Signed)
Virtual Visit via Telephone Note   This visit type was conducted due to national recommendations for restrictions regarding the COVID-19 Pandemic (e.g. social distancing) in an effort to limit this patient's exposure and mitigate transmission in our community.  Due to her co-morbid illnesses, this patient is at least at moderate risk for complications without adequate follow up.  This format is felt to be most appropriate for this patient at this time.  The patient did not have access to video technology/had technical difficulties with video requiring transitioning to audio format only (telephone).  All issues noted in this document were discussed and addressed.  No physical exam could be performed with this format.  Please refer to the patient's chart for her  consent to telehealth for [facility].   Date:  08/10/2018   ID:  [name], DOB [birth year], MRN [id number]  Patient Location: Home Provider Location: Home  PCP:  Hoyt Koch, MD  Cardiologist:  Minus Breeding, MD  Electrophysiologist:  None   Evaluation Performed:  Follow-Up Visit  Chief Complaint:  Cough  History of Present Illness:    [name] is a 73 y.o. female with repaired aortic dissection.  She is status post bioprosthetic AVR and replacement of the ascending thoracic aorta.  She has stage IV CKD and has been managed by Dr. Jimmy Footman for chronic diastolic HF with pleural effusions and edema.      Since I last saw her [facility] with [facility] failure.  I reviewed these records for this visit.   It looks line her discharge weight was about 162 lbs.  She was briefly intubated.    We had a long discussion about this trying to figure out whether there were any acute exacerbating issues.  I reviewed the hospital and emergency room records closely.  She had not been gaining weight as far she knows.  She has been abusing salt.  There was an increased swelling.  She has not had  fevers or chills.  She has been breathing relatively well.  She did not think she was particularly hypertensive.  When EMS got to her house that morning her heart rate was elevated.  She responded to diuretics and not sure exactly what her weight was when she was admitted but it is as reported above when she went home and it is been around that since then.    She continues to have a bit of a cough but she is feeling relatively well.  She is not having any chest pressure, neck or arm discomfort.  She is not bringing up anything with her cough.  Her weight and blood pressures have been stable.  Heart rate has been reasonable.  Of note a noncontrasted CT in the hospital was unremarkable and she had no DVTs on lower extremity Doppler.  The patient does not have symptoms concerning for COVID-19 infection (fever, chills, cough, or new shortness of breath).    Past Medical History:  Diagnosis Date   Acute pancreatitis 02/04/2017   [name] 02/04/2017   Adenomatous colon polyp 01/2002   Anemia, chronic disease    /notes 02/04/2017   Aortic valve disease    AI/AS   Arthritis    "lower back, knees" (02/04/2017)   CHF (congestive heart failure) (HCC)    CKD (chronic kidney disease), stage III (HCC)    Diverticulosis of colon    DJD (degenerative joint disease)    Frequent UTI    Hemorrhoids  Hypertension    Lumbar back pain    Neuropathy    Obesity    Pericardial effusion     in a patient with Diastolic heart  failure and Pericardial effusion  known since last 2 D echo in 11/2016 /notes 02/04/2017   PONV (postoperative nausea and vomiting)    Pulmonary HTN (HCC)    Renal cyst    Venous insufficiency    Vitamin D deficiency    Past Surgical History:  Procedure Laterality Date   ABDOMINAL HYSTERECTOMY     "partial"   AORTIC VALVE REPLACEMENT N/A 05/30/2017   Procedure: AORTIC VALVE REPLACEMENT (AVR);  Surgeon: Prescott Gum, Collier Salina, MD;  Location: [facility] OR;  Service:  Vascular;  Laterality: N/A;   COLONOSCOPY W/ BIOPSIES AND POLYPECTOMY     "bx was ok"   IR THORACENTESIS ASP PLEURAL SPACE W/IMG GUIDE  06/11/2017   IR THORACENTESIS ASP PLEURAL SPACE W/IMG GUIDE  06/12/2017   REPAIR OF ACUTE ASCENDING THORACIC AORTIC DISSECTION N/A 05/30/2017   Procedure: REPLACEMENT OF ASCENDING AORTIC ANEURYSM AND REPAIRED CHRONIC ROOT DISSECTION WITH CIRC ARREST;  Surgeon: Ivin Poot, MD;  Location: [facility];  Service: Vascular;  Laterality: N/A;   TEE WITHOUT CARDIOVERSION N/A 05/28/2017   Procedure: TRANSESOPHAGEAL ECHOCARDIOGRAM (TEE);  Surgeon: Skeet Latch, MD;  Location: [facility];  Service: Cardiovascular;  Laterality: N/A;   TEE WITHOUT CARDIOVERSION N/A 05/30/2017   Procedure: TRANSESOPHAGEAL ECHOCARDIOGRAM (TEE);  Surgeon: Prescott Gum, Collier Salina, MD;  Location: [facility];  Service: Open Heart Surgery;  Laterality: N/A;     Current Meds  Medication Sig   amLODipine (NORVASC) 5 MG tablet Take 5 mg by mouth at bedtime.   aspirin EC 81 MG tablet Take 81 mg by mouth daily.   calcitRIOL (ROCALTROL) 0.25 MCG capsule Take 1 capsule (0.25 mcg total) by mouth every other day. Please take every Monday, Wednesday, Friday (Patient taking differently: Take 0.25 mcg by mouth every other day. Please take every Monday, Friday)   calcium carbonate (TUMS - DOSED IN MG ELEMENTAL CALCIUM) 500 MG chewable tablet Chew 2 tablets by mouth daily as needed for indigestion or heartburn.   carvedilol (COREG) 25 MG tablet Take 1 tablet (25 mg total) by mouth 2 (two) times daily with a meal.   FAMOTIDINE PO Take 1 tablet by mouth 2 (two) times daily as needed (gas).   fluticasone (FLONASE) 50 MCG/ACT nasal spray Place 2 sprays into both nostrils daily as needed for allergies.   furosemide (LASIX) 80 MG tablet Take 160 mg by mouth 2 (two) times daily.   hydrocortisone (ANUSOL-HC) 2.5 % rectal cream Place 1 application rectally as needed.   ondansetron (ZOFRAN) 4 MG tablet Take 1  tablet (4 mg total) by mouth every 8 (eight) hours as needed for nausea or vomiting.   potassium chloride SA (K-DUR,KLOR-CON) 20 MEQ tablet Take 20 mEq by mouth daily.    Propylene Glycol (SYSTANE BALANCE) 0.6 % SOLN Place 1 drop into both eyes daily as needed (for dry eyes).   spironolactone (ALDACTONE) 25 MG tablet Take 1 tablet (25 mg total) by mouth daily.   triamcinolone ointment (KENALOG) 0.5 % Apply 1 application topically daily as needed (itching).     Allergies:   Minoxidil and Naproxen   Social History   Tobacco Use   Smoking status: Former Smoker    Packs/day: 0.40    Years: 15.00    Pack years: 6.00    Types: Cigarettes    Last attempt to quit: 04/08/1982  Years since quitting: 36.3   Smokeless tobacco: Never Used  Substance Use Topics   Alcohol use: No   Drug use: No     Family Hx: The patient's family history includes Colon polyps in her mother; Diabetes in her mother; Hypertension in her brother, mother, and sister. There is no history of Colon cancer.  ROS:   Please see the history of present illness.    As stated in the HPI and negative for all other systems.   Prior CV studies:   The following studies were reviewed today: .  Labs/Other Tests and Data Reviewed:    EKG:  Sinus tach with LBBB.  07/11/18  Recent Labs: 07/14/2018: B Natriuretic Peptide 3,068.5 07/15/2018: ALT 12; Magnesium 2.1 07/16/2018: BUN 53; Creatinine, Ser 2.66; Hemoglobin 9.0; Platelets 214; Potassium 3.5; Sodium 140   Recent Lipid Panel Lab Results  Component Value Date/Time   CHOL 141 02/04/2017 09:01 PM   TRIG 87 07/11/2018 12:13 PM   HDL 49 02/04/2017 09:01 PM   CHOLHDL 2.9 02/04/2017 09:01 PM   LDLCALC 76 02/04/2017 09:01 PM    Wt Readings from Last 3 Encounters:  08/10/18 160 lb (72.6 kg)  07/16/18 161 lb 14.4 oz (73.4 kg)  03/04/18 168 lb (76.2 kg)     Objective:    Vital Signs:  BP 136/74    Ht 5\' 5"  (1.651 m)    Wt 160 lb (72.6 kg)    BMI 26.63 kg/m     VITAL SIGNS:  reviewed  ASSESSMENT & PLAN:     Hypertension:  Her blood pressure at home seems to be very well controlled.  No change in therapy.  She keeps a close eye on this.  Hypokalemia: Of note she was sent home on Spironolactone.  I think it is reasonable to continue this.  She is going to have blood work done soon she says and I would like to make sure she gets basic metabolic profile in no less than a month and she is aware of this.    Chronic lower extremity edema:    She is currently not reporting this.   Acute diastolic heart failure:    As above.  Continue current therapy and we will follow up on labs.   AVR:   She will be seeing Dr. Prescott Gum soon.  By exam she seems to have a stable repair.  I suggested she could go to an 81 mg aspirin.  I will consider a follow-up echo when I see her in the future.  CKD Stage IV:   This is being followed by Dr. Micki Riley DISSECTION STATUS POST REPAIR:   She had a stable noncontrasted CT.  No change in therapy.  She will continue with blood pressure control.  COVID-19 Education: The signs and symptoms of COVID-19 were discussed with the patient and how to seek care for testing (follow up with PCP or arrange E-visit).  The importance of social distancing was discussed today.  Time:   Today, I have spent 30 minutes with the patient with telehealth technology discussing the above problems.     Medication Adjustments/Labs and Tests Ordered: Current medicines are reviewed at length with the patient today.  Concerns regarding medicines are outlined above.   Tests Ordered: No orders of the defined types were placed in this encounter.   Medication Changes: No orders of the defined types were placed in this encounter.   Disposition:  Follow up me in two months.  Signed, Minus Breeding, MD  08/10/2018 12:14 PM    [facility]

## 2018-08-10 ENCOUNTER — Telehealth (INDEPENDENT_AMBULATORY_CARE_PROVIDER_SITE_OTHER): Payer: Medicare Other | Admitting: Cardiology

## 2018-08-10 ENCOUNTER — Encounter: Payer: Self-pay | Admitting: Cardiology

## 2018-08-10 VITALS — BP 136/74 | Ht 65.0 in | Wt 160.0 lb

## 2018-08-10 DIAGNOSIS — I5031 Acute diastolic (congestive) heart failure: Secondary | ICD-10-CM | POA: Diagnosis not present

## 2018-08-10 DIAGNOSIS — N189 Chronic kidney disease, unspecified: Secondary | ICD-10-CM | POA: Insufficient documentation

## 2018-08-10 DIAGNOSIS — Z7189 Other specified counseling: Secondary | ICD-10-CM

## 2018-08-10 DIAGNOSIS — N184 Chronic kidney disease, stage 4 (severe): Secondary | ICD-10-CM | POA: Insufficient documentation

## 2018-08-10 DIAGNOSIS — I10 Essential (primary) hypertension: Secondary | ICD-10-CM

## 2018-08-10 DIAGNOSIS — Z952 Presence of prosthetic heart valve: Secondary | ICD-10-CM

## 2018-08-10 MED ORDER — SPIRONOLACTONE 25 MG PO TABS: 25.0000 mg | ORAL_TABLET | Freq: Every day | ORAL | 3 refills | Status: DC

## 2018-08-10 NOTE — Patient Instructions (Signed)
Medication Instructions:  Continue current medications  If you need a refill on your cardiac medications before your next appointment, please call your pharmacy.  Labwork: None Ordered   Testing/Procedures: None Ordered   Follow-Up: . Your physician recommends that you schedule a follow-up appointment in: 2 Months   At [facility], you and your health needs are our priority.  As part of our continuing mission to provide you with exceptional heart care, we have created designated Provider Care Teams.  These Care Teams include your primary Cardiologist (physician) and Advanced Practice Providers (APPs -  Physician Assistants and Nurse Practitioners) who all work together to provide you with the care you need, when you need it.  Thank you for choosing [facility] at [facility]!!

## 2018-08-19 DIAGNOSIS — I5030 Unspecified diastolic (congestive) heart failure: Secondary | ICD-10-CM | POA: Diagnosis not present

## 2018-08-19 DIAGNOSIS — I5033 Acute on chronic diastolic (congestive) heart failure: Secondary | ICD-10-CM | POA: Diagnosis not present

## 2018-09-19 DIAGNOSIS — I5033 Acute on chronic diastolic (congestive) heart failure: Secondary | ICD-10-CM | POA: Diagnosis not present

## 2018-09-19 DIAGNOSIS — I5030 Unspecified diastolic (congestive) heart failure: Secondary | ICD-10-CM | POA: Diagnosis not present

## 2018-10-19 DIAGNOSIS — I5033 Acute on chronic diastolic (congestive) heart failure: Secondary | ICD-10-CM | POA: Diagnosis not present

## 2018-10-19 DIAGNOSIS — I5030 Unspecified diastolic (congestive) heart failure: Secondary | ICD-10-CM | POA: Diagnosis not present

## 2018-11-19 DIAGNOSIS — I5033 Acute on chronic diastolic (congestive) heart failure: Secondary | ICD-10-CM | POA: Diagnosis not present

## 2018-11-19 DIAGNOSIS — I5030 Unspecified diastolic (congestive) heart failure: Secondary | ICD-10-CM | POA: Diagnosis not present

## 2018-12-11 ENCOUNTER — Ambulatory Visit (INDEPENDENT_AMBULATORY_CARE_PROVIDER_SITE_OTHER): Payer: Medicare Other | Admitting: Family Medicine

## 2018-12-11 ENCOUNTER — Encounter: Payer: Self-pay | Admitting: Family Medicine

## 2018-12-11 ENCOUNTER — Other Ambulatory Visit: Payer: Self-pay

## 2018-12-11 DIAGNOSIS — M5030 Other cervical disc degeneration, unspecified cervical region: Secondary | ICD-10-CM | POA: Diagnosis not present

## 2018-12-11 DIAGNOSIS — M25512 Pain in left shoulder: Secondary | ICD-10-CM

## 2018-12-11 NOTE — Progress Notes (Signed)
[name] [address] [place], [place] [address] Phone: [phone] Subjective:   I [name] am serving as a Education administrator for Dr. Hulan Saas.   CC: Neck pain  OVA:[id number]  [name] is a 73 y.o. female coming in with complaint of neck pain. Receives injections for neck spasm. Hasn't been able to sleep in bed for 2 weeks. Issues with laying down. History of open heart surgery. Tingling in the right hand. Certain fingers are numb.   Onset- Chronic   Character-dull, throbbing aching pain Aggravating factors-movement, unable to even sleep at night secondary to the pain Reliving factors-nothing specifically Therapies tried-  Severity-7 and 10     Past Medical History:  Diagnosis Date  . Acute pancreatitis 02/04/2017   [name] 02/04/2017  . Adenomatous colon polyp 01/2002  . Anemia, chronic disease    /notes 02/04/2017  . Aortic valve disease    AI/AS  . Arthritis    "lower back, knees" (02/04/2017)  . CHF (congestive heart failure) ([place])   . CKD (chronic kidney disease), stage III ([place])   . Diverticulosis of colon   . DJD (degenerative joint disease)   . Frequent UTI   . Hemorrhoids   . Hypertension   . Lumbar back pain   . Neuropathy   . Obesity   . Pericardial effusion     in a patient with Diastolic heart  failure and Pericardial effusion  known since last 2 D echo in 11/2016 /notes 02/04/2017  . PONV (postoperative nausea and vomiting)   . Pulmonary HTN ([place])   . Renal cyst   . Venous insufficiency   . Vitamin D deficiency    Past Surgical History:  Procedure Laterality Date  . ABDOMINAL HYSTERECTOMY     "partial"  . AORTIC VALVE REPLACEMENT N/A 05/30/2017   Procedure: AORTIC VALVE REPLACEMENT (AVR);  Surgeon: Prescott Gum, Collier Salina, MD;  Location: [facility];  Service: Vascular;  Laterality: N/A;  . COLONOSCOPY W/ BIOPSIES AND POLYPECTOMY     "bx was ok"  . IR THORACENTESIS ASP PLEURAL SPACE W/IMG GUIDE  06/11/2017  . IR  THORACENTESIS ASP PLEURAL SPACE W/IMG GUIDE  06/12/2017  . REPAIR OF ACUTE ASCENDING THORACIC AORTIC DISSECTION N/A 05/30/2017   Procedure: REPLACEMENT OF ASCENDING AORTIC ANEURYSM AND REPAIRED CHRONIC ROOT DISSECTION WITH CIRC ARREST;  Surgeon: Ivin Poot, MD;  Location: [facility];  Service: Vascular;  Laterality: N/A;  . TEE WITHOUT CARDIOVERSION N/A 05/28/2017   Procedure: TRANSESOPHAGEAL ECHOCARDIOGRAM (TEE);  Surgeon: Skeet Latch, MD;  Location: [facility];  Service: Cardiovascular;  Laterality: N/A;  . TEE WITHOUT CARDIOVERSION N/A 05/30/2017   Procedure: TRANSESOPHAGEAL ECHOCARDIOGRAM (TEE);  Surgeon: Prescott Gum, Collier Salina, MD;  Location: [facility];  Service: Open Heart Surgery;  Laterality: N/A;   Social History   Socioeconomic History  . Marital status: Married    Spouse name: [name]  . Number of children: 1  . Years of education: 46  . Highest education level: Not on file  Occupational History  . Occupation: retired from Administrator, arts  Social Needs  . Financial resource strain: Not on file  . Food insecurity    Worry: Not on file    Inability: Not on file  . Transportation needs    Medical: Not on file    Non-medical: Not on file  Tobacco Use  . Smoking status: Former Smoker    Packs/day: 0.40    Years: 15.00    Pack years: 6.00  Types: Cigarettes    Quit date: 04/08/1982    Years since quitting: 36.7  . Smokeless tobacco: Never Used  Substance and Sexual Activity  . Alcohol use: No  . Drug use: No  . Sexual activity: Never  Lifestyle  . Physical activity    Days per week: Not on file    Minutes per session: Not on file  . Stress: Not on file  Relationships  . Social Herbalist on phone: Not on file    Gets together: Not on file    Attends religious service: Not on file    Active member of club or organization: Not on file    Attends meetings of clubs or organizations: Not on file    Relationship status: Not on file  Other Topics Concern  . Not on  file  Social History Narrative   Lives at home w/ her husband   Right-handed   Daily caffeine    Allergies  Allergen Reactions  . Minoxidil Palpitations and Other (See Comments)    unable to sleep  . Naproxen Swelling   Family History  Problem Relation Age of Onset  . Diabetes Mother   . Colon polyps Mother   . Hypertension Mother   . Hypertension Brother   . Hypertension Sister   . Colon cancer Neg Hx     Current Outpatient Medications (Endocrine & Metabolic):  .  calcitRIOL (ROCALTROL) 0.25 MCG capsule, Take 1 capsule (0.25 mcg total) by mouth every other day. Please take every Monday, Wednesday, Friday (Patient taking differently: Take 0.25 mcg by mouth every other day. Please take every Monday, Friday)  Current Outpatient Medications (Cardiovascular):  .  amLODipine (NORVASC) 5 MG tablet, Take 5 mg by mouth at bedtime. .  carvedilol (COREG) 25 MG tablet, Take 1 tablet (25 mg total) by mouth 2 (two) times daily with a meal. .  furosemide (LASIX) 80 MG tablet, Take 160 mg by mouth 2 (two) times daily. Marland Kitchen  spironolactone (ALDACTONE) 25 MG tablet, Take 1 tablet (25 mg total) by mouth daily.  Current Outpatient Medications (Respiratory):  .  fluticasone (FLONASE) 50 MCG/ACT nasal spray, Place 2 sprays into both nostrils daily as needed for allergies.  Current Outpatient Medications (Analgesics):  .  aspirin EC 81 MG tablet, Take 81 mg by mouth daily.   Current Outpatient Medications (Other):  .  calcium carbonate (TUMS - DOSED IN MG ELEMENTAL CALCIUM) 500 MG chewable tablet, Chew 2 tablets by mouth daily as needed for indigestion or heartburn. Marland Kitchen  FAMOTIDINE PO, Take 1 tablet by mouth 2 (two) times daily as needed (gas). .  gabapentin (NEURONTIN) 300 MG capsule, TAKE 1 CAPSULE(300 MG) BY MOUTH THREE TIMES DAILY AS NEEDED FOR PAIN (Patient taking differently: 300 mg 3 (three) times daily as needed (pain). ) .  hydrocortisone (ANUSOL-HC) 2.5 % rectal cream, Place 1 application  rectally as needed. .  ondansetron (ZOFRAN) 4 MG tablet, Take 1 tablet (4 mg total) by mouth every 8 (eight) hours as needed for nausea or vomiting. .  potassium chloride SA (K-DUR,KLOR-CON) 20 MEQ tablet, Take 20 mEq by mouth daily.  Marland Kitchen  Propylene Glycol (SYSTANE BALANCE) 0.6 % SOLN, Place 1 drop into both eyes daily as needed (for dry eyes). .  triamcinolone ointment (KENALOG) 0.5 %, Apply 1 application topically daily as needed (itching).    Past medical history, social, surgical and family history all reviewed in electronic medical record.  No pertanent information unless stated regarding to  the chief complaint.   Review of Systems:  No headache, visual changes, nausea, vomiting, diarrhea, constipation, dizziness, abdominal pain, skin rash, fevers, chills, night sweats, weight loss, swollen lymph nodes, body aches, joint swelling,  chest pain, shortness of breath, mood changes.  Positive muscle aches  Objective  Blood pressure (!) 142/78, pulse 74, height 5\' 5"  (1.651 m), SpO2 98 %.    General: No apparent distress alert and oriented x3 mood and affect normal, dressed appropriately.  Patient does appear to be uncomfortable HEENT: Pupils equal, extraocular movements intact  Respiratory: Patient's speak in full sentences and does not appear short of breath  Cardiovascular: 2+ lower extremity edema, mild tender, no erythema  Skin: Warm dry intact with no signs of infection or rash on extremities or on axial skeleton.  Abdomen: Soft  Neuro: Cranial nerves II through XII are intact, neurovascularly intact in all extremities with 2+ DTRs and 2+ pulses.  Lymph: No lymphadenopathy of posterior or anterior cervical chain or axillae bilaterally.  Gait patient is in a wheelchair MSK:  tender with severe limited range of motion and stability and symmetric strength in extremities. Neck exam patient has forward displacement noted.  Patient has less than 5 degrees of extension of the neck.  Crepitus  noted.  Minimal sidebending bilaterally as well.  Patient does have minor weakness in grip strength of the hands bilaterally.  Severe trigger points noted in the left shoulder girdle.  After verbal consent patient was prepped with alcohol swabs and a 25-gauge needle was used in 3 distinct trigger points in the left trapezius area.  Total of 3 cc of 0.5% Marcaine and 1 cc of Kenalog 40 mg/mL used no blood loss.  Band-Aids placed.  Postinjection instructions given     Impression and Recommendations:     This case required medical decision making of moderate complexity. The above documentation has been reviewed and is accurate and complete Lyndal Pulley, DO       Note: This dictation was prepared with Dragon dictation along with smaller phrase technology. Any transcriptional errors that result from this process are unintentional.

## 2018-12-11 NOTE — Patient Instructions (Signed)
Good to see you  Tart cherry 800mg  at night Add 1000 IU of vitamin D See me again in 6-8 weeks

## 2018-12-12 ENCOUNTER — Encounter: Payer: Self-pay | Admitting: Family Medicine

## 2018-12-12 DIAGNOSIS — M25512 Pain in left shoulder: Secondary | ICD-10-CM | POA: Insufficient documentation

## 2018-12-12 DIAGNOSIS — M5030 Other cervical disc degeneration, unspecified cervical region: Secondary | ICD-10-CM | POA: Insufficient documentation

## 2018-12-12 NOTE — Assessment & Plan Note (Signed)
Injected December 12, 2018.  Patient had seen other providers and has responded to this previously.  Patient does have severe osteoarthritic changes of the neck including facet arthropathy with nerve impingement and flattening of the spinal cord that is concerning.  This will be unfortunately difficult to treat secondary to patient's other comorbidities.  Patient is not a surgical candidate and patient is significantly concerned about any type of epidurals.  We discussed icing regimen.  Home exercises, what activities to do which wants to avoid.  Follow-up again in 4 to 8 weeks

## 2018-12-12 NOTE — Assessment & Plan Note (Signed)
Discussed posture and ergonomics, discussed the likelihood of this continuing to be a difficulty.  Patient voiced understanding

## 2018-12-20 DIAGNOSIS — I5030 Unspecified diastolic (congestive) heart failure: Secondary | ICD-10-CM | POA: Diagnosis not present

## 2018-12-20 DIAGNOSIS — I5033 Acute on chronic diastolic (congestive) heart failure: Secondary | ICD-10-CM | POA: Diagnosis not present

## 2018-12-21 ENCOUNTER — Telehealth: Payer: Self-pay | Admitting: *Deleted

## 2018-12-21 NOTE — Telephone Encounter (Signed)
Pt left msg stating she is in a lot of pain & having a neck spasm. Would like something sent into pharmacy or a call back to advise her what she needs to do.

## 2018-12-22 MED ORDER — BACLOFEN 5 MG PO TABS: 5.0000 mg | ORAL_TABLET | Freq: Two times a day (BID) | ORAL | 1 refills | Status: DC

## 2018-12-22 NOTE — Telephone Encounter (Signed)
Sent in low dose baclofen which is a muscle relaxer to try but may make her sleepy

## 2019-01-12 NOTE — Progress Notes (Signed)
Cardiology Office Note   Date:  01/13/2019   ID:  [name], DOB 1945/12/03, MRN [id number]  PCP:  Hoyt Koch, MD  Cardiologist:   Minus Breeding, MD   Chief Complaint  Patient presents with  . Thoracic Aortic Dissection      History of Present Illness: [name] is a 73 y.o. female who presents for evaluation of repaired aortic dissection.  She is status post bioprosthetic AVR and replacement of the ascending thoracic aorta.  She has stage IV CKD and has been managed by Dr. Jimmy Footman for chronic diastolic HF with pleural effusions and edema.      She was admitted to [facility] in April with acute respiratory failure.  I reviewed these records for this visit.   Her discharge weight was about 162 lbs.  She was briefly intubated.      Since then she has felt well.  Her weight is crept up slowly but she says is because she is eating ice cream and she is unable to do very much because of knee pain.  She denies any shortness of breath.  She has had no PND or orthopnea.  She is had no palpitations, presyncope or syncope.  She has no chest pressure, neck or arm discomfort.  I did review a noncontrasted CT that she had in April and there was stable aortic repair.   Past Medical History:  Diagnosis Date  . Acute pancreatitis 02/04/2017   [name] 02/04/2017  . Adenomatous colon polyp 01/2002  . Anemia, chronic disease    /notes 02/04/2017  . Aortic valve disease    AI/AS  . Arthritis    "lower back, knees" (02/04/2017)  . CHF (congestive heart failure) ([place])   . CKD (chronic kidney disease), stage III   . Diverticulosis of colon   . DJD (degenerative joint disease)   . Frequent UTI   . Hemorrhoids   . Hypertension   . Lumbar back pain   . Neuropathy   . Obesity   . Pericardial effusion     in a patient with Diastolic heart  failure and Pericardial effusion  known since last 2 D echo in 11/2016 /notes 02/04/2017  . PONV (postoperative nausea and vomiting)   .  Pulmonary HTN ([place])   . Renal cyst   . Venous insufficiency   . Vitamin D deficiency     Past Surgical History:  Procedure Laterality Date  . ABDOMINAL HYSTERECTOMY     "partial"  . AORTIC VALVE REPLACEMENT N/A 05/30/2017   Procedure: AORTIC VALVE REPLACEMENT (AVR);  Surgeon: Prescott Gum, Collier Salina, MD;  Location: [facility];  Service: Vascular;  Laterality: N/A;  . COLONOSCOPY W/ BIOPSIES AND POLYPECTOMY     "bx was ok"  . IR THORACENTESIS ASP PLEURAL SPACE W/IMG GUIDE  06/11/2017  . IR THORACENTESIS ASP PLEURAL SPACE W/IMG GUIDE  06/12/2017  . REPAIR OF ACUTE ASCENDING THORACIC AORTIC DISSECTION N/A 05/30/2017   Procedure: REPLACEMENT OF ASCENDING AORTIC ANEURYSM AND REPAIRED CHRONIC ROOT DISSECTION WITH CIRC ARREST;  Surgeon: Ivin Poot, MD;  Location: [facility];  Service: Vascular;  Laterality: N/A;  . TEE WITHOUT CARDIOVERSION N/A 05/28/2017   Procedure: TRANSESOPHAGEAL ECHOCARDIOGRAM (TEE);  Surgeon: Skeet Latch, MD;  Location: [facility];  Service: Cardiovascular;  Laterality: N/A;  . TEE WITHOUT CARDIOVERSION N/A 05/30/2017   Procedure: TRANSESOPHAGEAL ECHOCARDIOGRAM (TEE);  Surgeon: Prescott Gum, Collier Salina, MD;  Location: [facility];  Service: Open Heart Surgery;  Laterality: N/A;     Current Outpatient  Medications  Medication Sig Dispense Refill  . amLODipine (NORVASC) 5 MG tablet Take 5 mg by mouth at bedtime.    Marland Kitchen aspirin EC 81 MG tablet Take 81 mg by mouth daily.    . Baclofen 5 MG TABS Take 2 tablets by mouth 2 (two) times daily as needed (neck cramps).    . calcitRIOL (ROCALTROL) 0.25 MCG capsule Take 0.25 mcg by mouth. Mondays and fridays    . calcium carbonate (TUMS - DOSED IN MG ELEMENTAL CALCIUM) 500 MG chewable tablet Chew 2 tablets by mouth daily as needed for indigestion or heartburn.    . carvedilol (COREG) 25 MG tablet Take 1 tablet (25 mg total) by mouth 2 (two) times daily with a meal.    . FAMOTIDINE PO Take 1 tablet by mouth 2 (two) times daily as needed (gas).    .  fluticasone (FLONASE) 50 MCG/ACT nasal spray Place 2 sprays into both nostrils daily as needed for allergies.    . furosemide (LASIX) 80 MG tablet Take 160 mg by mouth 2 (two) times daily.    Marland Kitchen gabapentin (NEURONTIN) 300 MG capsule TAKE 1 CAPSULE(300 MG) BY MOUTH THREE TIMES DAILY AS NEEDED FOR PAIN (Patient taking differently: 300 mg 3 (three) times daily as needed (pain). ) 90 capsule 3  . hydrocortisone (ANUSOL-HC) 2.5 % rectal cream Place 1 application rectally as needed. 30 g 0  . ondansetron (ZOFRAN) 4 MG tablet Take 1 tablet (4 mg total) by mouth every 8 (eight) hours as needed for nausea or vomiting. 20 tablet 0  . potassium chloride SA (K-DUR,KLOR-CON) 20 MEQ tablet Take 20 mEq by mouth daily.     Marland Kitchen Propylene Glycol (SYSTANE BALANCE) 0.6 % SOLN Place 1 drop into both eyes daily as needed (for dry eyes).    Marland Kitchen spironolactone (ALDACTONE) 25 MG tablet Take 1 tablet (25 mg total) by mouth daily. 90 tablet 3  . triamcinolone ointment (KENALOG) 0.5 % Apply 1 application topically daily as needed (itching).     No current facility-administered medications for this visit.     Allergies:   Minoxidil and Naproxen    ROS:  Please see the history of present illness.   Otherwise, review of systems are positive for none.   All other systems are reviewed and negative.    PHYSICAL EXAM: VS:  BP (!) 147/79   Pulse 73   Temp (!) 97.1 F (36.2 C)   Ht 5\' 5"  (1.651 m)   Wt 173 lb (78.5 kg)   SpO2 97%   BMI 28.79 kg/m  , BMI Body mass index is 28.79 kg/m. GEN:  No distress, slightly frail appearing NECK:  No jugular venous distention at 90 degrees, waveform within normal limits, carotid upstroke brisk and symmetric, no bruits, no thyromegaly LUNGS:  Clear to auscultation bilaterally BACK:  No CVA tenderness CHEST: Well-healed sternotomy scar. HEART:  S1 and S2 within normal limits, no S3, no S4, no clicks, no rubs, 3 of 6 apical systolic murmur radiating slightly at the aortic outflow tract, no  diastolic murmurs ABD:  Positive bowel sounds normal in frequency in pitch, no bruits, no rebound, no guarding, unable to assess midline mass or bruit with the patient seated. EXT:  2 plus pulses throughout, moderate edema, no cyanosis no clubbing SKIN:  No rashes no nodules NEURO:  Cranial nerves II through XII grossly intact, motor grossly intact throughout PSYCH:  Cognitively intact, oriented to person place and time   EKG:  EKG is not  ordered today.    Recent Labs: 07/14/2018: B Natriuretic Peptide 3,068.5 07/15/2018: ALT 12; Magnesium 2.1 07/16/2018: BUN 53; Creatinine, Ser 2.66; Hemoglobin 9.0; Platelets 214; Potassium 3.5; Sodium 140    Lipid Panel    Component Value Date/Time   CHOL 141 02/04/2017 2101   TRIG 87 07/11/2018 1213   HDL 49 02/04/2017 2101   CHOLHDL 2.9 02/04/2017 2101   VLDL 16 02/04/2017 2101   LDLCALC 76 02/04/2017 2101      Wt Readings from Last 3 Encounters:  01/13/19 173 lb (78.5 kg)  08/10/18 160 lb (72.6 kg)  07/16/18 161 lb 14.4 oz (73.4 kg)      Other studies Reviewed: Additional studies/ records that were reviewed today include:  Hospital records with CT. Review of the above records demonstrates:  Please see elsewhere in the note.     ASSESSMENT AND PLAN:  Hypertension:  Her blood pressure is slightly elevated but this is unusual.  She checks it at home and is well controlled.  No change in therapy.  Hypokalemia:   I will check a basic metabolic profile today.  She is not had labs recently.   Chronic lower extremity edema:    She has no ongoing edema.  No change in therapy.  Chronic diastolic heart failure:She seems to be euvolemic.  Check CBC.   AVR:  She will make a follow up appt with Dr. Prescott Gum.  As above her noncontrasted CT earlier this year was stable.  Check an echocardiogram in the spring.  At this point no change in therapy.   CKD Stage IV: I will check a basic metabolic profile today.  For now she will  remain on the meds as listed. This is being followed by Dr. Micki Riley DISSECTION STATUS POST REPAIR:   This will be followed as above.   Current medicines are reviewed at length with the patient today.  The patient does not have concerns regarding medicines.  The following changes have been made:  no change  Labs/ tests ordered today include:   Orders Placed This Encounter  Procedures  . Flu Vaccine QUAD High Dose(Fluad)  . CBC  . Basic metabolic panel  . ECHOCARDIOGRAM COMPLETE     Disposition:   FU with me in 1/2 year.     Signed, Minus Breeding, MD  01/13/2019 4:46 PM    [facility]

## 2019-01-13 ENCOUNTER — Ambulatory Visit: Payer: Medicare Other | Admitting: Cardiology

## 2019-01-13 ENCOUNTER — Encounter: Payer: Self-pay | Admitting: Cardiology

## 2019-01-13 ENCOUNTER — Other Ambulatory Visit: Payer: Self-pay

## 2019-01-13 VITALS — BP 147/79 | HR 73 | Temp 97.1°F | Ht 65.0 in | Wt 173.0 lb

## 2019-01-13 DIAGNOSIS — I10 Essential (primary) hypertension: Secondary | ICD-10-CM | POA: Diagnosis not present

## 2019-01-13 DIAGNOSIS — N184 Chronic kidney disease, stage 4 (severe): Secondary | ICD-10-CM

## 2019-01-13 DIAGNOSIS — Z23 Encounter for immunization: Secondary | ICD-10-CM

## 2019-01-13 DIAGNOSIS — M7989 Other specified soft tissue disorders: Secondary | ICD-10-CM

## 2019-01-13 DIAGNOSIS — I7101 Dissection of ascending aorta: Secondary | ICD-10-CM

## 2019-01-13 DIAGNOSIS — I5032 Chronic diastolic (congestive) heart failure: Secondary | ICD-10-CM | POA: Diagnosis not present

## 2019-01-13 NOTE — Patient Instructions (Signed)
Medication Instructions:  Your physician recommends that you continue on your current medications as directed. Please refer to the Current Medication list given to you today.  If you need a refill on your cardiac medications before your next appointment, please call your pharmacy.   Lab work: CBC and BMET If you have labs (blood work) drawn today and your tests are completely normal, you will receive your results only by: [place] (if you have MyChart) OR A paper copy in the mail If you have any lab test that is abnormal or we need to change your treatment, we will call you to review the results.  Testing/Procedures: Your physician has requested that you have an echocardiogram. Echocardiography is a painless test that uses sound waves to create images of your heart. It provides your doctor with information about the size and shape of your heart and how well your heart's chambers and valves are working. This procedure takes approximately one hour. There are no restrictions for this procedure. [place] [address]   Follow-Up: Your physician recommends that you schedule a follow-up appointment in: 6 months    Any Other Special Instructions Will Be Listed Below (If Applicable).  Preventing Influenza, Adult Influenza, more commonly known as "the flu," is a viral infection that mainly affects the respiratory tract. The respiratory tract includes structures that help you breathe, such as the lungs, nose, and throat. The flu causes many common cold symptoms, as well as a high fever and body aches. The flu spreads easily from person to person (is contagious). The flu is most common from December through March. This is called flu season.You can catch the flu virus by:  Breathing in droplets from an infected person's cough or sneeze.  Touching something that was recently contaminated with the virus and then touching your mouth, nose, or eyes. What can I do to lower my risk?         You can decrease your risk of getting the flu by:  Getting a flu shot (influenza vaccination) every year. This is the best way to prevent the flu. A flu shot is recommended for everyone age 54 months and older. ? It is best to get a flu shot in the fall, as soon as it is available. Getting a flu shot during winter or spring instead is still a good idea. Flu season can last into early spring. ? Preventing the flu through vaccination requires getting a new flu shot every year. This is because the flu virus changes slightly (mutates) from one year to the next. Even if a flu shot does not completely protect you from all flu virus mutations, it can reduce the severity of your illness and prevent dangerous complications of the flu. ? If you are pregnant, you can and should get a flu shot. ? If you have had a reaction to the shot in the past or if you are allergic to eggs, check with your health care provider before getting a flu shot. ? Sometimes the vaccine is available as a nasal spray. In some years, the nasal spray has not been as effective against the flu virus. Check with your health care provider if you have questions about this.  Practicing good health habits. This is especially important during flu season. ? Avoid contact with people who are sick with flu or cold symptoms. ? Wash your hands with soap and water often. If soap and water are not available, use alcohol-based hand sanitizer. ? Avoid  touching your hands to your face, especially when you have not washed your hands recently. ? Use a disinfectant to clean surfaces at home and at work that may be contaminated with the flu virus. ? Keep your body's disease-fighting system (immune system) in good shape by eating a healthy diet, drinking plenty of fluids, getting enough sleep, and exercising regularly. If you do get the flu, avoid spreading it to others by:  Staying home until your symptoms have been gone for at least one day.   Covering your mouth and nose when you cough or sneeze.  Avoiding close contact with others, especially babies and elderly people. Why are these changes important? Getting a flu shot and practicing good health habits protects you as well as other people. If you get the flu, your friends, family, and co-workers are also at risk of getting it, because it spreads so easily to others. Each year, about 2 out of every 10 people get the flu. Having the flu can lead to complications, such as pneumonia, ear infection, and sinus infection. The flu also can be deadly, especially for babies, people older than age 22, and people who have serious long-term diseases. How is this treated? Most people recover from the flu by resting at home and drinking plenty of fluids. However, a prescription antiviral medicine may reduce your flu symptoms and may make your flu go away sooner. This medicine must be started within a few days of getting flu symptoms. You can talk with your health care provider about whether you need an antiviral medicine. Antiviral medicine may be prescribed for people who are at risk for more serious flu symptoms. This includes people who:  Are older than age 43.  Are pregnant.  Have a condition that makes the flu worse or more dangerous. Where to find more information  Centers for Disease Control and Prevention: http://www.smith-bell.org/  [employer].[employer].ee: azureicus.com  American Academy of Family Physicians: familydoctor.org/familydoctor/en/kids/vaccines/preventing-the-flu.html Contact a health care provider if:  You have influenza and you develop new symptoms.  You have: ? Chest pain. ? Diarrhea. ? A fever.  Your cough gets worse, or you produce more mucus. Summary  The best way to prevent the flu is to get a flu shot every year in the fall.  Even if you get the flu after you have received the yearly vaccine, your flu may be milder and go away sooner because of your  flu shot.  If you get the flu, antiviral medicines that are started with a few days of symptoms may reduce your flu symptoms and may make your flu go away sooner.  You can also help prevent the flu by practicing good health habits. This information is not intended to replace advice given to you by your health care provider. Make sure you discuss any questions you have with your health care provider. Document Released: 04/09/2015 Document Revised: 03/07/2017 Document Reviewed: 12/02/2015 Elsevier Patient Education  2020 [employer].

## 2019-01-14 ENCOUNTER — Telehealth: Payer: Self-pay

## 2019-01-14 DIAGNOSIS — N184 Chronic kidney disease, stage 4 (severe): Secondary | ICD-10-CM

## 2019-01-14 LAB — BASIC METABOLIC PANEL
BUN/Creatinine Ratio: 30 — ABNORMAL HIGH (ref 12–28)
BUN: 87 mg/dL (ref 8–27)
CO2: 26 mmol/L (ref 20–29)
Calcium: 10 mg/dL (ref 8.7–10.3)
Chloride: 101 mmol/L (ref 96–106)
Creatinine, Ser: 2.94 mg/dL — ABNORMAL HIGH (ref 0.57–1.00)
GFR calc Af Amer: 18 mL/min/{1.73_m2} — ABNORMAL LOW (ref >59)
GFR calc non Af Amer: 15 mL/min/{1.73_m2} — ABNORMAL LOW (ref >59)
Glucose: 84 mg/dL (ref 65–99)
Potassium: 4.3 mmol/L (ref 3.5–5.2)
Sodium: 142 mmol/L (ref 134–144)

## 2019-01-14 LAB — CBC
Hematocrit: 30.9 % — ABNORMAL LOW (ref 34.0–46.6)
Hemoglobin: 10.3 g/dL — ABNORMAL LOW (ref 11.1–15.9)
MCH: 30.9 pg (ref 26.6–33.0)
MCHC: 33.3 g/dL (ref 31.5–35.7)
MCV: 93 fL (ref 79–97)
Platelets: 204 10*3/uL (ref 150–450)
RBC: 3.33 x10E6/uL — ABNORMAL LOW (ref 3.77–5.28)
RDW: 14.9 % (ref 11.7–15.4)
WBC: 7.4 10*3/uL (ref 3.4–10.8)

## 2019-01-14 MED ORDER — FUROSEMIDE 20 MG PO TABS: ORAL_TABLET | ORAL | 3 refills | Status: DC

## 2019-01-14 NOTE — Telephone Encounter (Signed)
-----   Message from Minus Breeding, MD sent at 01/14/2019  9:29 AM EDT ----- BUN and creat are up.  Can we try to reduce the Lasix to 40 am and 20 pm and repeat a BMET in one month.  Call Ms. Esteban with the results and send results to Hoyt Koch, MD

## 2019-01-14 NOTE — Telephone Encounter (Signed)
Gave pt results. Verbalized understanding. Put in new orders for lasix and BMET. Printed results and mailed to pt. Routed to PCP.

## 2019-01-19 DIAGNOSIS — I5033 Acute on chronic diastolic (congestive) heart failure: Secondary | ICD-10-CM | POA: Diagnosis not present

## 2019-01-19 DIAGNOSIS — I5030 Unspecified diastolic (congestive) heart failure: Secondary | ICD-10-CM | POA: Diagnosis not present

## 2019-02-10 ENCOUNTER — Ambulatory Visit: Payer: Medicare Other | Admitting: Family Medicine

## 2019-02-18 IMAGING — CR DG CHEST 2V
2 series · 2 of 2 positions shown · non-contrast
Comparison: Chest x-ray dated December 16, 2016.

CLINICAL DATA: Chest pain.

EXAM:
CHEST  2 VIEW

[chest lat]
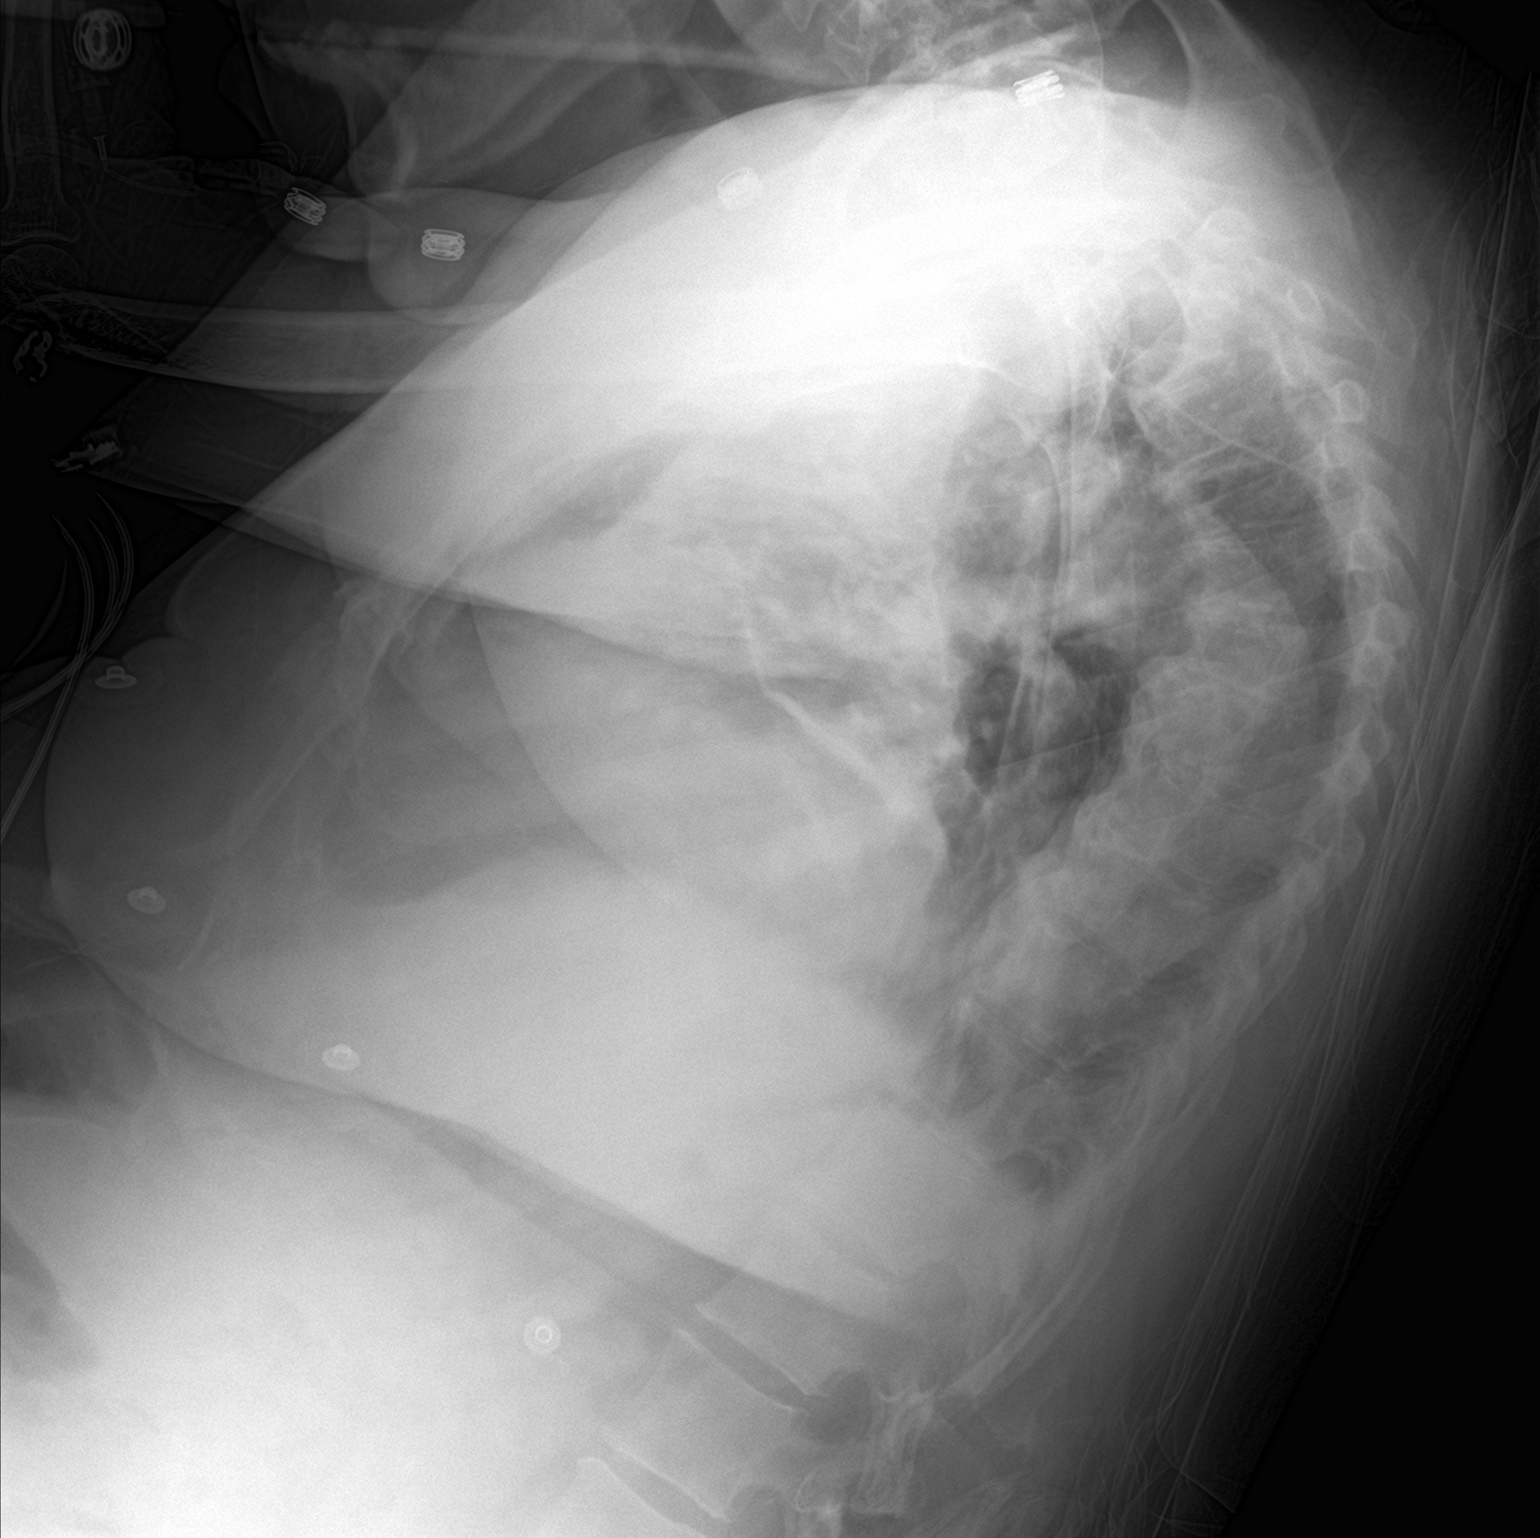

[chest ap]
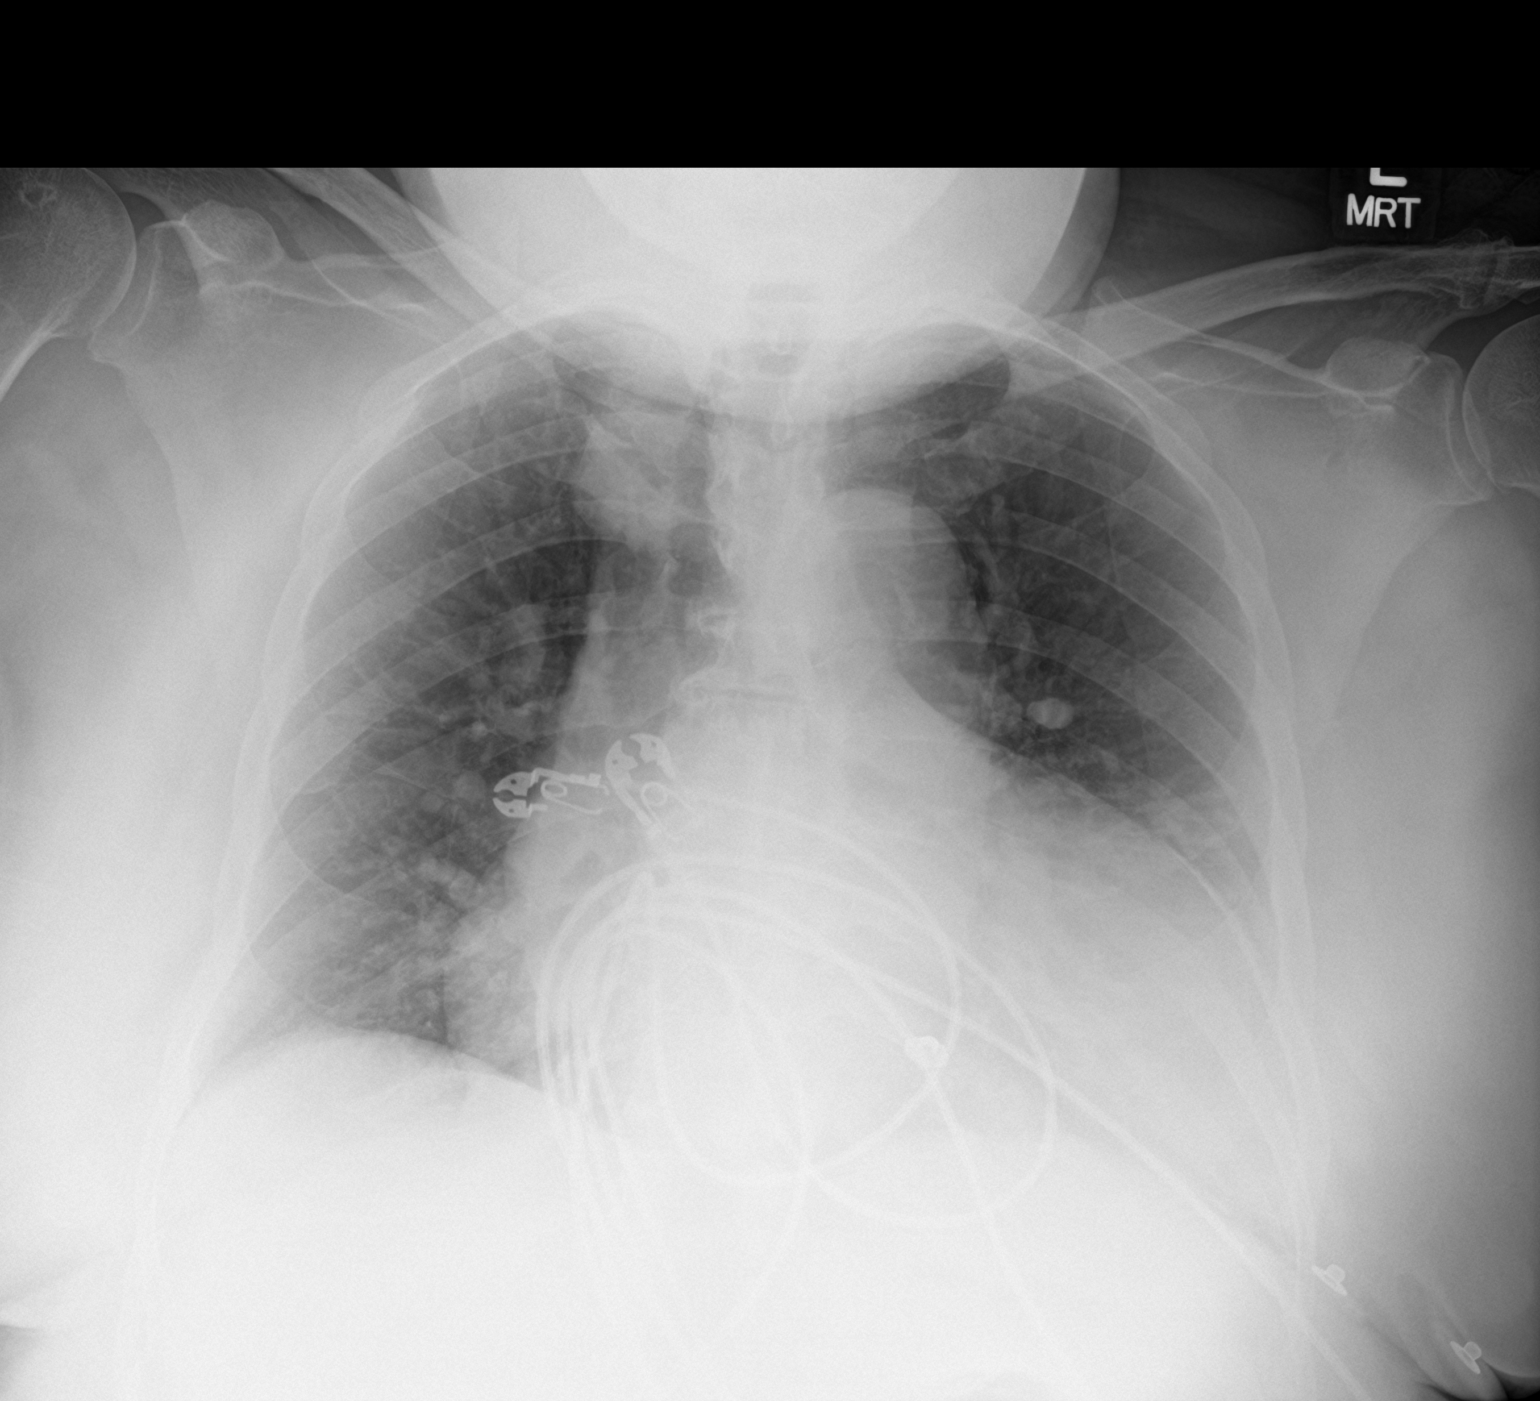

[2 of 2 positions shown; findings below may reference images not displayed]

FINDINGS: Moderate cardiomegaly, unchanged. Mild pulmonary vascular
congestion. No focal consolidation, pleural effusion, or
pneumothorax. No acute osseous abnormality.
IMPRESSION: Stable moderate cardiomegaly and pulmonary vascular congestion.

## 2019-02-19 DIAGNOSIS — I5030 Unspecified diastolic (congestive) heart failure: Secondary | ICD-10-CM | POA: Diagnosis not present

## 2019-02-19 DIAGNOSIS — I5033 Acute on chronic diastolic (congestive) heart failure: Secondary | ICD-10-CM | POA: Diagnosis not present

## 2019-02-25 ENCOUNTER — Ambulatory Visit (INDEPENDENT_AMBULATORY_CARE_PROVIDER_SITE_OTHER): Payer: Medicare Other | Admitting: Internal Medicine

## 2019-02-25 ENCOUNTER — Other Ambulatory Visit: Payer: Self-pay

## 2019-02-25 ENCOUNTER — Other Ambulatory Visit (INDEPENDENT_AMBULATORY_CARE_PROVIDER_SITE_OTHER): Payer: Medicare Other

## 2019-02-25 ENCOUNTER — Encounter: Payer: Self-pay | Admitting: Internal Medicine

## 2019-02-25 VITALS — BP 120/70 | HR 82 | Temp 98.5°F | Ht 65.0 in | Wt 172.0 lb

## 2019-02-25 DIAGNOSIS — Z0001 Encounter for general adult medical examination with abnormal findings: Secondary | ICD-10-CM

## 2019-02-25 DIAGNOSIS — R7301 Impaired fasting glucose: Secondary | ICD-10-CM

## 2019-02-25 DIAGNOSIS — M542 Cervicalgia: Secondary | ICD-10-CM

## 2019-02-25 DIAGNOSIS — Z23 Encounter for immunization: Secondary | ICD-10-CM

## 2019-02-25 DIAGNOSIS — N390 Urinary tract infection, site not specified: Secondary | ICD-10-CM

## 2019-02-25 DIAGNOSIS — I10 Essential (primary) hypertension: Secondary | ICD-10-CM

## 2019-02-25 DIAGNOSIS — I5032 Chronic diastolic (congestive) heart failure: Secondary | ICD-10-CM

## 2019-02-25 DIAGNOSIS — N184 Chronic kidney disease, stage 4 (severe): Secondary | ICD-10-CM | POA: Diagnosis not present

## 2019-02-25 LAB — URINALYSIS, ROUTINE W REFLEX MICROSCOPIC
Bilirubin Urine: NEGATIVE
Ketones, ur: NEGATIVE
Leukocytes,Ua: NEGATIVE
Nitrite: NEGATIVE
Specific Gravity, Urine: 1.015 (ref 1.000–1.030)
Total Protein, Urine: NEGATIVE
Urine Glucose: NEGATIVE
Urobilinogen, UA: 0.2 (ref 0.0–1.0)
pH: 6 (ref 5.0–8.0)

## 2019-02-25 LAB — LIPID PANEL
Cholesterol: 132 mg/dL (ref 0–200)
HDL: 42.6 mg/dL (ref >39.00)
LDL Cholesterol: 72 mg/dL (ref 0–99)
NonHDL: 89.5
Total CHOL/HDL Ratio: 3
Triglycerides: 90 mg/dL (ref 0.0–149.0)
VLDL: 18 mg/dL (ref 0.0–40.0)

## 2019-02-25 LAB — HEMOGLOBIN A1C: Hgb A1c MFr Bld: 5.1 % (ref 4.6–6.5)

## 2019-02-25 LAB — COMPREHENSIVE METABOLIC PANEL
ALT: 15 U/L (ref 0–35)
AST: 24 U/L (ref 0–37)
Albumin: 3.8 g/dL (ref 3.5–5.2)
Alkaline Phosphatase: 89 U/L (ref 39–117)
BUN: 69 mg/dL — ABNORMAL HIGH (ref 6–23)
CO2: 29 mEq/L (ref 19–32)
Calcium: 10 mg/dL (ref 8.4–10.5)
Chloride: 105 mEq/L (ref 96–112)
Creatinine, Ser: 2.57 mg/dL — ABNORMAL HIGH (ref 0.40–1.20)
GFR: 22.1 mL/min — ABNORMAL LOW (ref >60.00)
Glucose, Bld: 97 mg/dL (ref 70–99)
Potassium: 4 mEq/L (ref 3.5–5.1)
Sodium: 142 mEq/L (ref 135–145)
Total Bilirubin: 0.4 mg/dL (ref 0.2–1.2)
Total Protein: 7.7 g/dL (ref 6.0–8.3)

## 2019-02-25 LAB — BRAIN NATRIURETIC PEPTIDE: Pro B Natriuretic peptide (BNP): 545 pg/mL — ABNORMAL HIGH (ref 0.0–100.0)

## 2019-02-25 NOTE — Patient Instructions (Signed)
Health Maintenance, Female Adopting a healthy lifestyle and getting preventive care are important in promoting health and wellness. Ask your health care provider about:  The right schedule for you to have regular tests and exams.  Things you can do on your own to prevent diseases and keep yourself healthy. What should I know about diet, weight, and exercise? Eat a healthy diet   Eat a diet that includes plenty of vegetables, fruits, low-fat dairy products, and lean protein.  Do not eat a lot of foods that are high in solid fats, added sugars, or sodium. Maintain a healthy weight Body mass index (BMI) is used to identify weight problems. It estimates body fat based on height and weight. Your health care provider can help determine your BMI and help you achieve or maintain a healthy weight. Get regular exercise Get regular exercise. This is one of the most important things you can do for your health. Most adults should:  Exercise for at least 150 minutes each week. The exercise should increase your heart rate and make you sweat (moderate-intensity exercise).  Do strengthening exercises at least twice a week. This is in addition to the moderate-intensity exercise.  Spend less time sitting. Even light physical activity can be beneficial. Watch cholesterol and blood lipids Have your blood tested for lipids and cholesterol at 73 years of age, then have this test every 5 years. Have your cholesterol levels checked more often if:  Your lipid or cholesterol levels are high.  You are older than 73 years of age.  You are at high risk for heart disease. What should I know about cancer screening? Depending on your health history and family history, you may need to have cancer screening at various ages. This may include screening for:  Breast cancer.  Cervical cancer.  Colorectal cancer.  Skin cancer.  Lung cancer. What should I know about heart disease, diabetes, and high blood  pressure? Blood pressure and heart disease  High blood pressure causes heart disease and increases the risk of stroke. This is more likely to develop in people who have high blood pressure readings, are of African descent, or are overweight.  Have your blood pressure checked: ? Every 3-5 years if you are 18-39 years of age. ? Every year if you are 40 years old or older. Diabetes Have regular diabetes screenings. This checks your fasting blood sugar level. Have the screening done:  Once every three years after age 40 if you are at a normal weight and have a low risk for diabetes.  More often and at a younger age if you are overweight or have a high risk for diabetes. What should I know about preventing infection? Hepatitis B If you have a higher risk for hepatitis B, you should be screened for this virus. Talk with your health care provider to find out if you are at risk for hepatitis B infection. Hepatitis C Testing is recommended for:  Everyone born from 1945 through 1965.  Anyone with known risk factors for hepatitis C. Sexually transmitted infections (STIs)  Get screened for STIs, including gonorrhea and chlamydia, if: ? You are sexually active and are younger than 73 years of age. ? You are older than 73 years of age and your health care provider tells you that you are at risk for this type of infection. ? Your sexual activity has changed since you were last screened, and you are at increased risk for chlamydia or gonorrhea. Ask your health care provider if   you are at risk.  Ask your health care provider about whether you are at high risk for HIV. Your health care provider may recommend a prescription medicine to help prevent HIV infection. If you choose to take medicine to prevent HIV, you should first get tested for HIV. You should then be tested every 3 months for as long as you are taking the medicine. Pregnancy  If you are about to stop having your period (premenopausal) and  you may become pregnant, seek counseling before you get pregnant.  Take 400 to 800 micrograms (mcg) of folic acid every day if you become pregnant.  Ask for birth control (contraception) if you want to prevent pregnancy. Osteoporosis and menopause Osteoporosis is a disease in which the bones lose minerals and strength with aging. This can result in bone fractures. If you are 65 years old or older, or if you are at risk for osteoporosis and fractures, ask your health care provider if you should:  Be screened for bone loss.  Take a calcium or vitamin D supplement to lower your risk of fractures.  Be given hormone replacement therapy (HRT) to treat symptoms of menopause. Follow these instructions at home: Lifestyle  Do not use any products that contain nicotine or tobacco, such as cigarettes, e-cigarettes, and chewing tobacco. If you need help quitting, ask your health care provider.  Do not use street drugs.  Do not share needles.  Ask your health care provider for help if you need support or information about quitting drugs. Alcohol use  Do not drink alcohol if: ? Your health care provider tells you not to drink. ? You are pregnant, may be pregnant, or are planning to become pregnant.  If you drink alcohol: ? Limit how much you use to 0-1 drink a day. ? Limit intake if you are breastfeeding.  Be aware of how much alcohol is in your drink. In the [place] one drink equals one 12 oz bottle of beer (355 mL), one 5 oz glass of wine (148 mL), or one 1 oz glass of hard liquor (44 mL). General instructions  Schedule regular health, dental, and eye exams.  Stay current with your vaccines.  Tell your health care provider if: ? You often feel depressed. ? You have ever been abused or do not feel safe at home. Summary  Adopting a healthy lifestyle and getting preventive care are important in promoting health and wellness.  Follow your health care provider's instructions about healthy  diet, exercising, and getting tested or screened for diseases.  Follow your health care provider's instructions on monitoring your cholesterol and blood pressure. This information is not intended to replace advice given to you by your health care provider. Make sure you discuss any questions you have with your health care provider. Document Released: 10/08/2010 Document Revised: 03/18/2018 Document Reviewed: 03/18/2018 Elsevier Patient Education  2020 Elsevier Inc.  

## 2019-02-25 NOTE — Progress Notes (Signed)
Subjective:   Patient ID: [name], female    DOB: [birth year], 73 y.o.   MRN: [id number]  HPI Here for medicare wellness and physical, no new complaints. Please see A/P for status and treatment of chronic medical problems.   Diet: heart healthy Physical activity: sedentary Depression/mood screen: negative Hearing: intact to whispered voice Visual acuity: grossly normal, performs annual eye exam  ADLs: needs assist Fall risk: medium Home safety: good Cognitive evaluation: intact to orientation, naming, recall and repetition EOL planning: adv directives discussed    Office Visit from 02/25/2019 in [facility]  PHQ-2 Total Score  1      I have personally reviewed and have noted 1. The patient's medical and social history - reviewed today no changes 2. Their use of alcohol, tobacco or illicit drugs 3. Their current medications and supplements 4. The patient's functional ability including ADL's, fall risks, home safety risks and hearing or visual impairment. 5. Diet and physical activities 6. Evidence for depression or mood disorders 7. Care team reviewed and updated  Patient Care Team: Hoyt Koch, MD as PCP - General (Internal Medicine) Minus Breeding, MD as PCP - Cardiology (Cardiology) Past Medical History:  Diagnosis Date  . Acute pancreatitis 02/04/2017   [name] 02/04/2017  . Adenomatous colon polyp 01/2002  . Anemia, chronic disease    /notes 02/04/2017  . Aortic valve disease    AI/AS  . Arthritis    "lower back, knees" (02/04/2017)  . CHF (congestive heart failure) ([place])   . CKD (chronic kidney disease), stage III   . Diverticulosis of colon   . DJD (degenerative joint disease)   . Frequent UTI   . Hemorrhoids   . Hypertension   . Lumbar back pain   . Neuropathy   . Obesity   . Pericardial effusion     in a patient with Diastolic heart  failure and Pericardial effusion  known since last 2 D echo in 11/2016 /notes  02/04/2017  . PONV (postoperative nausea and vomiting)   . Pulmonary HTN ([place])   . Renal cyst   . Venous insufficiency   . Vitamin D deficiency    Past Surgical History:  Procedure Laterality Date  . ABDOMINAL HYSTERECTOMY     "partial"  . AORTIC VALVE REPLACEMENT N/A 05/30/2017   Procedure: AORTIC VALVE REPLACEMENT (AVR);  Surgeon: Prescott Gum, Collier Salina, MD;  Location: [facility];  Service: Vascular;  Laterality: N/A;  . COLONOSCOPY W/ BIOPSIES AND POLYPECTOMY     "bx was ok"  . IR THORACENTESIS ASP PLEURAL SPACE W/IMG GUIDE  06/11/2017  . IR THORACENTESIS ASP PLEURAL SPACE W/IMG GUIDE  06/12/2017  . REPAIR OF ACUTE ASCENDING THORACIC AORTIC DISSECTION N/A 05/30/2017   Procedure: REPLACEMENT OF ASCENDING AORTIC ANEURYSM AND REPAIRED CHRONIC ROOT DISSECTION WITH CIRC ARREST;  Surgeon: Ivin Poot, MD;  Location: [facility];  Service: Vascular;  Laterality: N/A;  . TEE WITHOUT CARDIOVERSION N/A 05/28/2017   Procedure: TRANSESOPHAGEAL ECHOCARDIOGRAM (TEE);  Surgeon: Skeet Latch, MD;  Location: [facility];  Service: Cardiovascular;  Laterality: N/A;  . TEE WITHOUT CARDIOVERSION N/A 05/30/2017   Procedure: TRANSESOPHAGEAL ECHOCARDIOGRAM (TEE);  Surgeon: Prescott Gum, Collier Salina, MD;  Location: [facility];  Service: Open Heart Surgery;  Laterality: N/A;   Family History  Problem Relation Age of Onset  . Diabetes Mother   . Colon polyps Mother   . Hypertension Mother   . Hypertension Brother   . Hypertension Sister   . Colon cancer Neg  Hx     Review of Systems  Constitutional: Positive for activity change and fatigue.  HENT: Negative.   Eyes: Negative.   Respiratory: Positive for shortness of breath. Negative for cough and chest tightness.   Cardiovascular: Negative for chest pain, palpitations and leg swelling.  Gastrointestinal: Negative for abdominal distention, abdominal pain, constipation, diarrhea, nausea and vomiting.  Musculoskeletal: Positive for arthralgias and gait problem.  Skin: Negative.    Neurological: Positive for weakness. Negative for dizziness, light-headedness and headaches.  Psychiatric/Behavioral: Positive for dysphoric mood.    Objective:  Physical Exam Constitutional:      Appearance: She is well-developed. She is obese.  HENT:     Head: Normocephalic and atraumatic.  Neck:     Musculoskeletal: Normal range of motion.  Cardiovascular:     Rate and Rhythm: Normal rate and regular rhythm.  Pulmonary:     Effort: Pulmonary effort is normal. No respiratory distress.     Breath sounds: Normal breath sounds. No wheezing or rales.  Abdominal:     General: Bowel sounds are normal. There is no distension.     Palpations: Abdomen is soft.     Tenderness: There is no abdominal tenderness. There is no rebound.  Musculoskeletal:        General: Tenderness present.     Comments: 1+ edema bilaterally to knees  Skin:    General: Skin is warm and dry.  Neurological:     Mental Status: She is alert and oriented to person, place, and time. Mental status is at baseline.     Coordination: Coordination abnormal.     Comments: Wheelchair for ambulation     Vitals:   02/25/19 1321  BP: 120/70  Pulse: 82  Temp: 98.5 F (36.9 C)  TempSrc: Oral  SpO2: 97%  Weight: 172 lb (78 kg)  Height: 5\' 5"  (1.651 m)   This visit occurred during the SARS-CoV-2 public health emergency.  Safety protocols were in place, including screening questions prior to the visit, additional usage of staff PPE, and extensive cleaning of exam room while observing appropriate contact time as indicated for disinfecting solutions.   Assessment & Plan:  Prevnar 13 given at visit

## 2019-02-26 DIAGNOSIS — Z7189 Other specified counseling: Secondary | ICD-10-CM | POA: Insufficient documentation

## 2019-02-26 DIAGNOSIS — Z0001 Encounter for general adult medical examination with abnormal findings: Secondary | ICD-10-CM | POA: Insufficient documentation

## 2019-02-26 DIAGNOSIS — Z515 Encounter for palliative care: Secondary | ICD-10-CM | POA: Insufficient documentation

## 2019-02-26 NOTE — Assessment & Plan Note (Signed)
Flu shot up to date. Pneumonia given prevnar 13 to complete series. Shingrix counseled. Tetanus due 2026. Colonoscopy due declines. Mammogram due declines, pap smear aged out and dexa not indicated. Counseled about sun safety and mole surveillance. Counseled about the dangers of distracted driving. Given 10 year screening recommendations.

## 2019-02-26 NOTE — Assessment & Plan Note (Signed)
BP at goal on coreg, amlodipine, lasix, spironolactone.

## 2019-02-26 NOTE — Assessment & Plan Note (Signed)
Checking U/A to ensure gone.

## 2019-02-26 NOTE — Assessment & Plan Note (Signed)
Chronic and worse recently.

## 2019-02-26 NOTE — Assessment & Plan Note (Signed)
Checking renal function panel. Recent labs fairly stable GFR 18.

## 2019-02-26 NOTE — Assessment & Plan Note (Signed)
Checking BNP for flare as she is having some more fluid in legs. She has picked up some weight since pandemic which is likely not just fluid weight. On lasix and coreg.

## 2019-03-21 DIAGNOSIS — I5033 Acute on chronic diastolic (congestive) heart failure: Secondary | ICD-10-CM | POA: Diagnosis not present

## 2019-03-21 DIAGNOSIS — I5030 Unspecified diastolic (congestive) heart failure: Secondary | ICD-10-CM | POA: Diagnosis not present

## 2019-03-24 DIAGNOSIS — I129 Hypertensive chronic kidney disease with stage 1 through stage 4 chronic kidney disease, or unspecified chronic kidney disease: Secondary | ICD-10-CM | POA: Diagnosis not present

## 2019-03-24 DIAGNOSIS — N189 Chronic kidney disease, unspecified: Secondary | ICD-10-CM | POA: Diagnosis not present

## 2019-03-24 DIAGNOSIS — N184 Chronic kidney disease, stage 4 (severe): Secondary | ICD-10-CM | POA: Diagnosis not present

## 2019-03-24 DIAGNOSIS — N2581 Secondary hyperparathyroidism of renal origin: Secondary | ICD-10-CM | POA: Diagnosis not present

## 2019-03-24 DIAGNOSIS — D631 Anemia in chronic kidney disease: Secondary | ICD-10-CM | POA: Diagnosis not present

## 2019-04-06 ENCOUNTER — Other Ambulatory Visit: Payer: Self-pay

## 2019-04-06 ENCOUNTER — Encounter: Payer: Self-pay | Admitting: Family Medicine

## 2019-04-06 ENCOUNTER — Ambulatory Visit: Payer: Medicare Other | Admitting: Family Medicine

## 2019-04-06 DIAGNOSIS — M5030 Other cervical disc degeneration, unspecified cervical region: Secondary | ICD-10-CM | POA: Diagnosis not present

## 2019-04-06 DIAGNOSIS — M170 Bilateral primary osteoarthritis of knee: Secondary | ICD-10-CM | POA: Insufficient documentation

## 2019-04-06 DIAGNOSIS — N184 Chronic kidney disease, stage 4 (severe): Secondary | ICD-10-CM | POA: Diagnosis not present

## 2019-04-06 NOTE — Progress Notes (Signed)
[name] [address] [place], [place] [address] Phone: [phone] Subjective:   [name], am serving as a scribe for Dr. Hulan Saas. This visit occurred during the SARS-CoV-2 public health emergency.  Safety protocols were in place, including screening questions prior to the visit, additional usage of staff PPE, and extensive cleaning of exam room while observing appropriate contact time as indicated for disinfecting solutions.    CC: Bilateral knee pain and neck pain follow-up  ZRA:[id number]   9/*07/2018 Discussed posture and ergonomics, discussed the likelihood of this continuing to be a difficulty.  Patient voiced understanding 05/08/2018 Monovisc injections from Dr. Raeford Razor  Update 04/06/2019 [name] is a 73 y.o. female coming in with complaint of neck pain. Patient states that she slept in a chair for the month of November due to her neck pain. Pain has improved with the use of Tylenol prn. Trigger point injection lasted for 3 days.   Also having bilateral knee pain L>R. Would like injections in both knees. Uses a cane in the house and feels a lot of soreness in the left knee. Has been using ice and heat.      Past Medical History:  Diagnosis Date  . Acute pancreatitis 02/04/2017   [name] 02/04/2017  . Adenomatous colon polyp 01/2002  . Anemia, chronic disease    /notes 02/04/2017  . Aortic valve disease    AI/AS  . Arthritis    "lower back, knees" (02/04/2017)  . CHF (congestive heart failure) ([place])   . CKD (chronic kidney disease), stage III   . Diverticulosis of colon   . DJD (degenerative joint disease)   . Frequent UTI   . Hemorrhoids   . Hypertension   . Lumbar back pain   . Neuropathy   . Obesity   . Pericardial effusion     in a patient with Diastolic heart  failure and Pericardial effusion  known since last 2 D echo in 11/2016 /notes 02/04/2017  . PONV (postoperative nausea and vomiting)   . Pulmonary HTN  ([place])   . Renal cyst   . Venous insufficiency   . Vitamin D deficiency    Past Surgical History:  Procedure Laterality Date  . ABDOMINAL HYSTERECTOMY     "partial"  . AORTIC VALVE REPLACEMENT N/A 05/30/2017   Procedure: AORTIC VALVE REPLACEMENT (AVR);  Surgeon: Prescott Gum, Collier Salina, MD;  Location: [facility];  Service: Vascular;  Laterality: N/A;  . COLONOSCOPY W/ BIOPSIES AND POLYPECTOMY     "bx was ok"  . IR THORACENTESIS ASP PLEURAL SPACE W/IMG GUIDE  06/11/2017  . IR THORACENTESIS ASP PLEURAL SPACE W/IMG GUIDE  06/12/2017  . REPAIR OF ACUTE ASCENDING THORACIC AORTIC DISSECTION N/A 05/30/2017   Procedure: REPLACEMENT OF ASCENDING AORTIC ANEURYSM AND REPAIRED CHRONIC ROOT DISSECTION WITH CIRC ARREST;  Surgeon: Ivin Poot, MD;  Location: [facility];  Service: Vascular;  Laterality: N/A;  . TEE WITHOUT CARDIOVERSION N/A 05/28/2017   Procedure: TRANSESOPHAGEAL ECHOCARDIOGRAM (TEE);  Surgeon: Skeet Latch, MD;  Location: [facility];  Service: Cardiovascular;  Laterality: N/A;  . TEE WITHOUT CARDIOVERSION N/A 05/30/2017   Procedure: TRANSESOPHAGEAL ECHOCARDIOGRAM (TEE);  Surgeon: Prescott Gum, Collier Salina, MD;  Location: [facility];  Service: Open Heart Surgery;  Laterality: N/A;   Social History   Socioeconomic History  . Marital status: Married    Spouse name: Juanda Crumble  . Number of children: 1  . Years of education: 92  . Highest education level: Not on file  Occupational History  . Occupation: retired from [employer], [employer]  Tobacco Use  . Smoking status: Former Smoker    Packs/day: 0.40    Years: 15.00    Pack years: 6.00    Types: Cigarettes    Quit date: 04/08/1982    Years since quitting: 37.0  . Smokeless tobacco: Never Used  Substance and Sexual Activity  . Alcohol use: No  . Drug use: No  . Sexual activity: Never  Other Topics Concern  . Not on file  Social History Narrative   Lives at home w/ her husband   Right-handed   Daily caffeine    Social Determinants of Health   Financial  Resource Strain:   . Difficulty of Paying Living Expenses: Not on file  Food Insecurity:   . Worried About Charity fundraiser in the Last Year: Not on file  . Ran Out of Food in the Last Year: Not on file  Transportation Needs:   . Lack of Transportation (Medical): Not on file  . Lack of Transportation (Non-Medical): Not on file  Physical Activity:   . Days of Exercise per Week: Not on file  . Minutes of Exercise per Session: Not on file  Stress:   . Feeling of Stress : Not on file  Social Connections:   . Frequency of Communication with Friends and Family: Not on file  . Frequency of Social Gatherings with Friends and Family: Not on file  . Attends Religious Services: Not on file  . Active Member of Clubs or Organizations: Not on file  . Attends Archivist Meetings: Not on file  . Marital Status: Not on file   Allergies  Allergen Reactions  . Minoxidil Palpitations and Other (See Comments)    unable to sleep  . Naproxen Swelling   Family History  Problem Relation Age of Onset  . Diabetes Mother   . Colon polyps Mother   . Hypertension Mother   . Hypertension Brother   . Hypertension Sister   . Colon cancer Neg Hx     Current Outpatient Medications (Endocrine & Metabolic):  .  calcitRIOL (ROCALTROL) 0.25 MCG capsule, Take 0.25 mcg by mouth. Mondays and fridays  Current Outpatient Medications (Cardiovascular):  .  amLODipine (NORVASC) 5 MG tablet, Take 5 mg by mouth at bedtime. .  carvedilol (COREG) 25 MG tablet, Take 1 tablet (25 mg total) by mouth 2 (two) times daily with a meal. .  furosemide (LASIX) 20 MG tablet, Take two tablets in the morning and take one tablet in the evening. Marland Kitchen  spironolactone (ALDACTONE) 25 MG tablet, Take 1 tablet (25 mg total) by mouth daily.  Current Outpatient Medications (Respiratory):  .  fluticasone (FLONASE) 50 MCG/ACT nasal spray, Place 2 sprays into both nostrils daily as needed for allergies.  Current Outpatient  Medications (Analgesics):  .  aspirin EC 81 MG tablet, Take 81 mg by mouth daily.   Current Outpatient Medications (Other):  Marland Kitchen  Baclofen 5 MG TABS, Take 2 tablets by mouth 2 (two) times daily as needed (neck cramps). .  calcium carbonate (TUMS - DOSED IN MG ELEMENTAL CALCIUM) 500 MG chewable tablet, Chew 2 tablets by mouth daily as needed for indigestion or heartburn. Marland Kitchen  FAMOTIDINE PO, Take 1 tablet by mouth 2 (two) times daily as needed (gas). .  gabapentin (NEURONTIN) 300 MG capsule, TAKE 1 CAPSULE(300 MG) BY MOUTH THREE TIMES DAILY AS NEEDED FOR PAIN (Patient taking differently: 300 mg 3 (three) times daily as needed (  pain). ) .  hydrocortisone (ANUSOL-HC) 2.5 % rectal cream, Place 1 application rectally as needed. .  ondansetron (ZOFRAN) 4 MG tablet, Take 1 tablet (4 mg total) by mouth every 8 (eight) hours as needed for nausea or vomiting. .  potassium chloride SA (K-DUR,KLOR-CON) 20 MEQ tablet, Take 20 mEq by mouth daily.  Marland Kitchen  Propylene Glycol (SYSTANE BALANCE) 0.6 % SOLN, Place 1 drop into both eyes daily as needed (for dry eyes). .  triamcinolone ointment (KENALOG) 0.5 %, Apply 1 application topically daily as needed (itching).    Past medical history, social, surgical and family history all reviewed in electronic medical record.  No pertanent information unless stated regarding to the chief complaint.   Review of Systems:  No headache, visual changes, nausea, vomiting, diarrhea, constipation, dizziness, abdominal pain, skin rash, fevers, chills, night sweats, weight loss, swollen lymph nodes,  chest pain, shortness of breath, mood changes.  Positive muscle aches, body aches  Objective  Blood pressure 118/72, pulse 82, height 5\' 5"  (1.651 m), weight 172 lb (78 kg), SpO2 93 %.    General: No apparent distress alert and oriented x3 mood and affect normal, dressed appropriately.   HEENT: Pupils equal, extraocular movements intact significant torticollis noted Respiratory: Patient's  speak in full sentences and does not appear short of breath  Cardiovascular: 2+ lower extremity edema, tender, no erythema  Skin: Warm dry intact with no signs of infection or rash on extremities or on axial skeleton.  Dry skin noted Lymph: No lymphadenopathy of posterior or anterior cervical chain or axillae bilaterally.  Gait patient is in a wheelchair MSK: Significant arthritic changes of multiple joints.  Neck exam has anterior displacement and has been lacking range of motion in all planes.  Significant tightness of the right side of the neck causing patient's to have a chronic left-sided sidebending and right-sided rotation.  Unable to tolerate Spurling's maneuver.  Significant tightness of the musculature surrounding the neck in the parascapular region.  Knee: Bilateral valgus deformity noted. Large thigh to calf ratio.  Tender to palpation over medial and PF joint line.  Limited range of motion in all planes lacking the last 10 degrees in flexion extension instability with valgus force.  painful patellar compression. Patellar glide with moderate crepitus. Patellar and quadriceps tendons unremarkable. Hamstring and quadriceps strength is normal.   After informed written and verbal consent, patient was seated on exam table. Right knee was prepped with alcohol swab and utilizing anterolateral approach, patient's right knee space was injected with 4:1  marcaine 0.5%: Kenalog 40mg /dL. Patient tolerated the procedure well without immediate complications.  After informed written and verbal consent, patient was seated on exam table. Left knee was prepped with alcohol swab and utilizing anterolateral approach, patient's left knee space was injected with 4:1  marcaine 0.5%: Kenalog 40mg /dL. Patient tolerated the procedure well without immediate complications.   Impression and Recommendations:     This case required medical decision making of moderate complexity. The above documentation has  been reviewed and is accurate and complete Lyndal Pulley, DO       Note: This dictation was prepared with Dragon dictation along with smaller phrase technology. Any transcriptional errors that result from this process are unintentional.

## 2019-04-06 NOTE — Assessment & Plan Note (Signed)
Bilateral injections given today.  Feels like the viscosupplementation previously did not help.  We discussed icing regimen and home exercises, patient is mostly wheelchair-bound.  Discussed strengthening.  Patient does not want to go to formal physical therapy secondary to coronavirus outbreak.  Follow-up with me again in 8 to 10 weeks for further injections

## 2019-04-06 NOTE — Assessment & Plan Note (Signed)
Severe overall with torticollis.  Patient is doing the muscle relaxers as needed.  Patient is doing gabapentin intermittently.  Patient will follow up with me again in 8 to 10 weeks

## 2019-04-06 NOTE — Patient Instructions (Signed)
See me again in 10-12 weeks Bilateral injections today [place]

## 2019-04-21 DIAGNOSIS — I5030 Unspecified diastolic (congestive) heart failure: Secondary | ICD-10-CM | POA: Diagnosis not present

## 2019-04-21 DIAGNOSIS — I5033 Acute on chronic diastolic (congestive) heart failure: Secondary | ICD-10-CM | POA: Diagnosis not present

## 2019-05-11 ENCOUNTER — Ambulatory Visit (INDEPENDENT_AMBULATORY_CARE_PROVIDER_SITE_OTHER): Payer: Medicare Other | Admitting: Family

## 2019-05-11 ENCOUNTER — Encounter: Payer: Self-pay | Admitting: Family

## 2019-05-11 DIAGNOSIS — R05 Cough: Secondary | ICD-10-CM

## 2019-05-11 DIAGNOSIS — I5032 Chronic diastolic (congestive) heart failure: Secondary | ICD-10-CM | POA: Diagnosis not present

## 2019-05-11 DIAGNOSIS — R059 Cough, unspecified: Secondary | ICD-10-CM

## 2019-05-11 MED ORDER — DOXYCYCLINE HYCLATE 100 MG PO TABS: 100.0000 mg | ORAL_TABLET | Freq: Two times a day (BID) | ORAL | 0 refills | Status: DC

## 2019-05-11 MED ORDER — BENZONATATE 100 MG PO CAPS: 100.0000 mg | ORAL_CAPSULE | Freq: Three times a day (TID) | ORAL | 0 refills | Status: DC | PRN

## 2019-05-11 NOTE — Progress Notes (Signed)
[name] is a 74 y.o. female with the following history as recorded in EpicCare:  Patient Active Problem List   Diagnosis Date Noted  . Degenerative arthritis of knee, bilateral 04/06/2019  . Encounter for general adult medical examination with abnormal findings 02/26/2019  . Degenerative disc disease, cervical 12/12/2018  . CKD (chronic kidney disease), stage IV ([place]) 08/10/2018  . Leg swelling 11/24/2017  . Bilateral leg weakness 06/26/2017  . S/P AVR 05/30/2017  . Ascending aortic dissection ([place])   . Chronic diastolic CHF (congestive heart failure) ([place]) 04/19/2017  . Nonrheumatic aortic valve insufficiency 03/10/2017  . Pulmonary HTN ([place]) 03/10/2017  . Neck pain 02/11/2017  . Pericardial effusion 02/04/2017  . Abnormal bone marrow examination 01/24/2017  . Stenosis of right carotid artery 08/27/2016  . LBBB (left bundle branch block) 08/27/2016  . Left-sided tinnitus 08/16/2016  . Anemia in chronic kidney disease 11/02/2015  . Complicated UTI (urinary tract infection) 04/26/2014  . OA (osteoarthritis) of knee 03/23/2012  . Vitamin D deficiency 04/13/2008  . OBESITY 02/23/2008  . Radiculopathy 02/23/2008  . Essential hypertension 02/23/2008  . Venous (peripheral) insufficiency 02/23/2008    Current Outpatient Medications  Medication Sig Dispense Refill  . amLODipine (NORVASC) 5 MG tablet Take 5 mg by mouth at bedtime.    Marland Kitchen aspirin EC 81 MG tablet Take 81 mg by mouth daily.    . Baclofen 5 MG TABS Take 2 tablets by mouth 2 (two) times daily as needed (neck cramps).    . benzonatate (TESSALON) 100 MG capsule Take 1 capsule (100 mg total) by mouth 3 (three) times daily as needed. 30 capsule 0  . calcitRIOL (ROCALTROL) 0.25 MCG capsule Take 0.25 mcg by mouth. Mondays and fridays    . calcium carbonate (TUMS - DOSED IN MG ELEMENTAL CALCIUM) 500 MG chewable tablet Chew 2 tablets by mouth daily as needed for indigestion or heartburn.    . carvedilol (COREG) 25 MG tablet Take  1 tablet (25 mg total) by mouth 2 (two) times daily with a meal.    . doxycycline (VIBRA-TABS) 100 MG tablet Take 1 tablet (100 mg total) by mouth 2 (two) times daily. 20 tablet 0  . FAMOTIDINE PO Take 1 tablet by mouth 2 (two) times daily as needed (gas).    . fluticasone (FLONASE) 50 MCG/ACT nasal spray Place 2 sprays into both nostrils daily as needed for allergies.    . furosemide (LASIX) 20 MG tablet Take two tablets in the morning and take one tablet in the evening. 270 tablet 3  . gabapentin (NEURONTIN) 300 MG capsule TAKE 1 CAPSULE(300 MG) BY MOUTH THREE TIMES DAILY AS NEEDED FOR PAIN (Patient taking differently: 300 mg 3 (three) times daily as needed (pain). ) 90 capsule 3  . hydrocortisone (ANUSOL-HC) 2.5 % rectal cream Place 1 application rectally as needed. 30 g 0  . ondansetron (ZOFRAN) 4 MG tablet Take 1 tablet (4 mg total) by mouth every 8 (eight) hours as needed for nausea or vomiting. 20 tablet 0  . potassium chloride SA (K-DUR,KLOR-CON) 20 MEQ tablet Take 20 mEq by mouth daily.     Marland Kitchen Propylene Glycol (SYSTANE BALANCE) 0.6 % SOLN Place 1 drop into both eyes daily as needed (for dry eyes).    Marland Kitchen spironolactone (ALDACTONE) 25 MG tablet Take 1 tablet (25 mg total) by mouth daily. 90 tablet 3  . triamcinolone ointment (KENALOG) 0.5 % Apply 1 application topically daily as needed (itching).     No current facility-administered medications  for this visit.    Allergies: Minoxidil and Naproxen  Past Medical History:  Diagnosis Date  . Acute pancreatitis 02/04/2017   [name] 02/04/2017  . Adenomatous colon polyp 01/2002  . Anemia, chronic disease    /notes 02/04/2017  . Aortic valve disease    AI/AS  . Arthritis    "lower back, knees" (02/04/2017)  . CHF (congestive heart failure) ([place])   . CKD (chronic kidney disease), stage III   . Diverticulosis of colon   . DJD (degenerative joint disease)   . Frequent UTI   . Hemorrhoids   . Hypertension   . Lumbar back pain   .  Neuropathy   . Obesity   . Pericardial effusion     in a patient with Diastolic heart  failure and Pericardial effusion  known since last 2 D echo in 11/2016 /notes 02/04/2017  . PONV (postoperative nausea and vomiting)   . Pulmonary HTN ([place])   . Renal cyst   . Venous insufficiency   . Vitamin D deficiency     Past Surgical History:  Procedure Laterality Date  . ABDOMINAL HYSTERECTOMY     "partial"  . AORTIC VALVE REPLACEMENT N/A 05/30/2017   Procedure: AORTIC VALVE REPLACEMENT (AVR);  Surgeon: Prescott Gum, Collier Salina, MD;  Location: [facility];  Service: Vascular;  Laterality: N/A;  . COLONOSCOPY W/ BIOPSIES AND POLYPECTOMY     "bx was ok"  . IR THORACENTESIS ASP PLEURAL SPACE W/IMG GUIDE  06/11/2017  . IR THORACENTESIS ASP PLEURAL SPACE W/IMG GUIDE  06/12/2017  . REPAIR OF ACUTE ASCENDING THORACIC AORTIC DISSECTION N/A 05/30/2017   Procedure: REPLACEMENT OF ASCENDING AORTIC ANEURYSM AND REPAIRED CHRONIC ROOT DISSECTION WITH CIRC ARREST;  Surgeon: Ivin Poot, MD;  Location: [facility];  Service: Vascular;  Laterality: N/A;  . TEE WITHOUT CARDIOVERSION N/A 05/28/2017   Procedure: TRANSESOPHAGEAL ECHOCARDIOGRAM (TEE);  Surgeon: Skeet Latch, MD;  Location: [facility];  Service: Cardiovascular;  Laterality: N/A;  . TEE WITHOUT CARDIOVERSION N/A 05/30/2017   Procedure: TRANSESOPHAGEAL ECHOCARDIOGRAM (TEE);  Surgeon: Prescott Gum, Collier Salina, MD;  Location: [facility];  Service: Open Heart Surgery;  Laterality: N/A;    Family History  Problem Relation Age of Onset  . Diabetes Mother   . Colon polyps Mother   . Hypertension Mother   . Hypertension Brother   . Hypertension Sister   . Colon cancer Neg Hx     Social History   Tobacco Use  . Smoking status: Former Smoker    Packs/day: 0.40    Years: 15.00    Pack years: 6.00    Types: Cigarettes    Quit date: 04/08/1982    Years since quitting: 37.1  . Smokeless tobacco: Never Used  Substance Use Topics  . Alcohol use: No    Subjective:    I  connected with [name] on 05/11/19 at 12:20 PM EST by a telephone call and verified that I am speaking with the correct person using two identifiers.   I discussed the limitations of evaluation and management by telemedicine and the availability of in person appointments. The patient expressed understanding and agreed to proceed.  Provider in office/ patient is at home; provider and patient are only 2 people on telephone call.   Patient notes she started with a cough on Saturday; history of heart failure- supposed to be wearing oxygen at night/ admits she wears it intermittently; notes that when she uses the oxygen she feels more congested; Feeling drainage in the back of her  throat/ "feels like something is there"- has been using Flonase intermittently;  Per patient, she weighed herself today and weight is normal at 172; patient denies any concerns for retaining fluid at this time;  Per patient, she is running a low grade fever- was at 99 this morning; denies any known exposure to COVID- "I don't go out." Notes however that her sister in law did recently test positive for COVID- she has not been around the sister but the sister's husband does come to their home daily to bring food;    Objective:  There were no vitals filed for this visit.  Lungs: Respirations unlabored;  Neurologic: Alert and oriented; speech intact;  Assessment:  1. Cough   2. Chronic diastolic CHF (congestive heart failure) (HCC)     Plan:  Due to history of CHF, will go ahead and start antibiotic to cover for potential infection; Rx for Doxycyline and Tessalon Perles sent to patient's pharmacy; patient is encouraged to continue daily weights and monitor for signs of fluid as well; She is also encouraged to schedule for COVID test due to possible exposure from family member; phone number given for [facility] to schedule and she agrees.  Time spent 15 minutes  No follow-ups on file.  No orders of the defined types were  placed in this encounter.   Requested Prescriptions   Signed Prescriptions Disp Refills  . doxycycline (VIBRA-TABS) 100 MG tablet 20 tablet 0    Sig: Take 1 tablet (100 mg total) by mouth 2 (two) times daily.  . benzonatate (TESSALON) 100 MG capsule 30 capsule 0    Sig: Take 1 capsule (100 mg total) by mouth 3 (three) times daily as needed.

## 2019-05-14 ENCOUNTER — Telehealth: Payer: Self-pay | Admitting: Internal Medicine

## 2019-05-14 ENCOUNTER — Other Ambulatory Visit: Payer: Self-pay | Admitting: Family

## 2019-05-14 MED ORDER — PROMETHAZINE-DM 6.25-15 MG/5ML PO SYRP: 5.0000 mL | ORAL_SOLUTION | Freq: Four times a day (QID) | ORAL | 0 refills | Status: DC | PRN

## 2019-05-14 NOTE — Telephone Encounter (Signed)
  Patient calling to report "cough medication called in on Tuesday is making me gag" Patient had telephone visit with [facility] on  2/2 Patient states she has nausea, requesting medication be changed

## 2019-05-14 NOTE — Telephone Encounter (Signed)
Which medication is she having problems with? The Doxycycline ( antibiotic) or the [employer]

## 2019-05-17 ENCOUNTER — Other Ambulatory Visit: Payer: Medicare Other

## 2019-05-20 MED ORDER — PROMETHAZINE-DM 6.25-15 MG/5ML PO SYRP: 5.0000 mL | ORAL_SOLUTION | Freq: Four times a day (QID) | ORAL | 0 refills | Status: DC | PRN

## 2019-05-20 NOTE — Telephone Encounter (Signed)
F/u   The patient is asking can a liquid medication cough/congestion called in verse pill form - unable to keep it down.   [facility] on [address] on  05/11/19

## 2019-05-20 NOTE — Telephone Encounter (Signed)
Have refilled the cough medicine syrup [name] sent in on 05/14/19 promethazine/dm cough syrup.

## 2019-05-20 NOTE — Addendum Note (Signed)
Addended by: Pricilla Holm A on: 05/20/2019 09:16 AM   Modules accepted: Orders

## 2019-05-21 ENCOUNTER — Telehealth: Payer: Self-pay

## 2019-05-21 NOTE — Telephone Encounter (Addendum)
She was asked to get a COVID test when we did her first visit on 05/11/2019; she is going to need to be seen- since she hasn't done the COVID test, would recommend that she be scheduled at the respiratory clinic. This cough has now been present for 10+ days- unfortunately, we can't give her more antibiotics without someone seeing her.

## 2019-05-21 NOTE — Telephone Encounter (Signed)
    Patient requesting doxycycline be changed to a liquid antibiotic. Patient states her cough is not any better, unable to swallow tablet. Last visit was with LValere Dross on 02/02.

## 2019-05-21 NOTE — Telephone Encounter (Signed)
F/u   The patient asking for an antibiotic in a liquid form. It's hard getting the pill form down.    [facility] on [address].

## 2019-05-21 NOTE — Telephone Encounter (Signed)
Spoke with patient and she will call next week to set up COVID test.

## 2019-05-22 DIAGNOSIS — I5030 Unspecified diastolic (congestive) heart failure: Secondary | ICD-10-CM | POA: Diagnosis not present

## 2019-05-22 DIAGNOSIS — I5033 Acute on chronic diastolic (congestive) heart failure: Secondary | ICD-10-CM | POA: Diagnosis not present

## 2019-06-05 ENCOUNTER — Encounter (HOSPITAL_COMMUNITY): Payer: Self-pay | Admitting: Emergency Medicine

## 2019-06-05 ENCOUNTER — Inpatient Hospital Stay (HOSPITAL_COMMUNITY)
Admission: EM | Admit: 2019-06-05 | Discharge: 2019-06-09 | DRG: 177 | Disposition: A | Discharge status: Home Health | Payer: Medicare Other | Attending: Internal Medicine | Admitting: Internal Medicine

## 2019-06-05 ENCOUNTER — Inpatient Hospital Stay (HOSPITAL_COMMUNITY): Payer: Medicare Other

## 2019-06-05 ENCOUNTER — Emergency Department (HOSPITAL_COMMUNITY): Payer: Medicare Other

## 2019-06-05 ENCOUNTER — Other Ambulatory Visit: Payer: Self-pay

## 2019-06-05 DIAGNOSIS — Z8249 Family history of ischemic heart disease and other diseases of the circulatory system: Secondary | ICD-10-CM | POA: Diagnosis not present

## 2019-06-05 DIAGNOSIS — Z833 Family history of diabetes mellitus: Secondary | ICD-10-CM | POA: Diagnosis not present

## 2019-06-05 DIAGNOSIS — Z8371 Family history of colonic polyps: Secondary | ICD-10-CM | POA: Diagnosis not present

## 2019-06-05 DIAGNOSIS — U071 COVID-19: Principal | ICD-10-CM | POA: Diagnosis present

## 2019-06-05 DIAGNOSIS — Z6831 Body mass index (BMI) 31.0-31.9, adult: Secondary | ICD-10-CM | POA: Diagnosis not present

## 2019-06-05 DIAGNOSIS — Z7982 Long term (current) use of aspirin: Secondary | ICD-10-CM

## 2019-06-05 DIAGNOSIS — R0602 Shortness of breath: Secondary | ICD-10-CM

## 2019-06-05 DIAGNOSIS — I361 Nonrheumatic tricuspid (valve) insufficiency: Secondary | ICD-10-CM

## 2019-06-05 DIAGNOSIS — J984 Other disorders of lung: Secondary | ICD-10-CM | POA: Diagnosis not present

## 2019-06-05 DIAGNOSIS — I509 Heart failure, unspecified: Secondary | ICD-10-CM

## 2019-06-05 DIAGNOSIS — I130 Hypertensive heart and chronic kidney disease with heart failure and stage 1 through stage 4 chronic kidney disease, or unspecified chronic kidney disease: Secondary | ICD-10-CM | POA: Diagnosis present

## 2019-06-05 DIAGNOSIS — N189 Chronic kidney disease, unspecified: Secondary | ICD-10-CM | POA: Diagnosis present

## 2019-06-05 DIAGNOSIS — M1990 Unspecified osteoarthritis, unspecified site: Secondary | ICD-10-CM | POA: Diagnosis not present

## 2019-06-05 DIAGNOSIS — I447 Left bundle-branch block, unspecified: Secondary | ICD-10-CM | POA: Diagnosis not present

## 2019-06-05 DIAGNOSIS — Z888 Allergy status to other drugs, medicaments and biological substances status: Secondary | ICD-10-CM

## 2019-06-05 DIAGNOSIS — E669 Obesity, unspecified: Secondary | ICD-10-CM | POA: Diagnosis present

## 2019-06-05 DIAGNOSIS — J069 Acute upper respiratory infection, unspecified: Secondary | ICD-10-CM | POA: Diagnosis not present

## 2019-06-05 DIAGNOSIS — Z87891 Personal history of nicotine dependence: Secondary | ICD-10-CM | POA: Diagnosis not present

## 2019-06-05 DIAGNOSIS — I340 Nonrheumatic mitral (valve) insufficiency: Secondary | ICD-10-CM

## 2019-06-05 DIAGNOSIS — R0902 Hypoxemia: Secondary | ICD-10-CM | POA: Diagnosis not present

## 2019-06-05 DIAGNOSIS — I10 Essential (primary) hypertension: Secondary | ICD-10-CM | POA: Diagnosis present

## 2019-06-05 DIAGNOSIS — R000 Tachycardia, unspecified: Secondary | ICD-10-CM | POA: Diagnosis not present

## 2019-06-05 DIAGNOSIS — N184 Chronic kidney disease, stage 4 (severe): Secondary | ICD-10-CM | POA: Diagnosis present

## 2019-06-05 DIAGNOSIS — R062 Wheezing: Secondary | ICD-10-CM | POA: Diagnosis not present

## 2019-06-05 DIAGNOSIS — I272 Pulmonary hypertension, unspecified: Secondary | ICD-10-CM | POA: Diagnosis present

## 2019-06-05 DIAGNOSIS — I110 Hypertensive heart disease with heart failure: Secondary | ICD-10-CM | POA: Diagnosis not present

## 2019-06-05 DIAGNOSIS — J1282 Pneumonia due to coronavirus disease 2019: Secondary | ICD-10-CM | POA: Diagnosis present

## 2019-06-05 DIAGNOSIS — K5730 Diverticulosis of large intestine without perforation or abscess without bleeding: Secondary | ICD-10-CM | POA: Diagnosis present

## 2019-06-05 DIAGNOSIS — Z79899 Other long term (current) drug therapy: Secondary | ICD-10-CM | POA: Diagnosis not present

## 2019-06-05 DIAGNOSIS — I5033 Acute on chronic diastolic (congestive) heart failure: Secondary | ICD-10-CM | POA: Diagnosis not present

## 2019-06-05 DIAGNOSIS — I5032 Chronic diastolic (congestive) heart failure: Secondary | ICD-10-CM | POA: Diagnosis present

## 2019-06-05 DIAGNOSIS — J189 Pneumonia, unspecified organism: Secondary | ICD-10-CM

## 2019-06-05 DIAGNOSIS — R069 Unspecified abnormalities of breathing: Secondary | ICD-10-CM | POA: Diagnosis not present

## 2019-06-05 DIAGNOSIS — Z953 Presence of xenogenic heart valve: Secondary | ICD-10-CM | POA: Diagnosis not present

## 2019-06-05 DIAGNOSIS — J9601 Acute respiratory failure with hypoxia: Secondary | ICD-10-CM | POA: Diagnosis not present

## 2019-06-05 DIAGNOSIS — R7989 Other specified abnormal findings of blood chemistry: Secondary | ICD-10-CM | POA: Diagnosis not present

## 2019-06-05 DIAGNOSIS — Z743 Need for continuous supervision: Secondary | ICD-10-CM | POA: Diagnosis not present

## 2019-06-05 DIAGNOSIS — I5030 Unspecified diastolic (congestive) heart failure: Secondary | ICD-10-CM | POA: Diagnosis not present

## 2019-06-05 DIAGNOSIS — Z886 Allergy status to analgesic agent status: Secondary | ICD-10-CM | POA: Diagnosis not present

## 2019-06-05 LAB — POCT I-STAT EG7
Acid-Base Excess: 5 mmol/L — ABNORMAL HIGH (ref 0.0–2.0)
Bicarbonate: 28 mmol/L (ref 20.0–28.0)
Calcium, Ion: 1.15 mmol/L (ref 1.15–1.40)
HCT: 28 % — ABNORMAL LOW (ref 36.0–46.0)
Hemoglobin: 9.5 g/dL — ABNORMAL LOW (ref 12.0–15.0)
O2 Saturation: 100 %
Potassium: 3.4 mmol/L — ABNORMAL LOW (ref 3.5–5.1)
Sodium: 145 mmol/L (ref 135–145)
TCO2: 29 mmol/L (ref 22–32)
pCO2, Ven: 34.1 mmHg — ABNORMAL LOW (ref 44.0–60.0)
pH, Ven: 7.522 — ABNORMAL HIGH (ref 7.250–7.430)
pO2, Ven: 152 mmHg — ABNORMAL HIGH (ref 32.0–45.0)

## 2019-06-05 LAB — CBC WITH DIFFERENTIAL/PLATELET
Abs Immature Granulocytes: 0.04 10*3/uL (ref 0.00–0.07)
Basophils Absolute: 0 10*3/uL (ref 0.0–0.1)
Basophils Relative: 0 %
Eosinophils Absolute: 0.3 10*3/uL (ref 0.0–0.5)
Eosinophils Relative: 3 %
HCT: 29.6 % — ABNORMAL LOW (ref 36.0–46.0)
Hemoglobin: 9.4 g/dL — ABNORMAL LOW (ref 12.0–15.0)
Immature Granulocytes: 1 %
Lymphocytes Relative: 20 %
Lymphs Abs: 1.6 10*3/uL (ref 0.7–4.0)
MCH: 30.2 pg (ref 26.0–34.0)
MCHC: 31.8 g/dL (ref 30.0–36.0)
MCV: 95.2 fL (ref 80.0–100.0)
Monocytes Absolute: 0.8 10*3/uL (ref 0.1–1.0)
Monocytes Relative: 9 %
Neutro Abs: 5.4 10*3/uL (ref 1.7–7.7)
Neutrophils Relative %: 67 %
Platelets: 154 10*3/uL (ref 150–400)
RBC: 3.11 MIL/uL — ABNORMAL LOW (ref 3.87–5.11)
RDW: 16.2 % — ABNORMAL HIGH (ref 11.5–15.5)
WBC: 8 10*3/uL (ref 4.0–10.5)
nRBC: 0 % (ref 0.0–0.2)

## 2019-06-05 LAB — ECHOCARDIOGRAM LIMITED
Height: 65 in
Weight: 2752 oz

## 2019-06-05 LAB — COMPREHENSIVE METABOLIC PANEL
ALT: 17 U/L (ref 0–44)
AST: 27 U/L (ref 15–41)
Albumin: 3.2 g/dL — ABNORMAL LOW (ref 3.5–5.0)
Alkaline Phosphatase: 85 U/L (ref 38–126)
Anion gap: 12 (ref 5–15)
BUN: 47 mg/dL — ABNORMAL HIGH (ref 8–23)
CO2: 26 mmol/L (ref 22–32)
Calcium: 9.4 mg/dL (ref 8.9–10.3)
Chloride: 106 mmol/L (ref 98–111)
Creatinine, Ser: 2.35 mg/dL — ABNORMAL HIGH (ref 0.44–1.00)
GFR calc Af Amer: 23 mL/min — ABNORMAL LOW (ref >60)
GFR calc non Af Amer: 20 mL/min — ABNORMAL LOW (ref >60)
Glucose, Bld: 103 mg/dL — ABNORMAL HIGH (ref 70–99)
Potassium: 3.4 mmol/L — ABNORMAL LOW (ref 3.5–5.1)
Sodium: 144 mmol/L (ref 135–145)
Total Bilirubin: 0.8 mg/dL (ref 0.3–1.2)
Total Protein: 7.4 g/dL (ref 6.5–8.1)

## 2019-06-05 LAB — RESPIRATORY PANEL BY RT PCR (FLU A&B, COVID)
Influenza A by PCR: NEGATIVE
Influenza B by PCR: NEGATIVE
SARS Coronavirus 2 by RT PCR: POSITIVE — AB

## 2019-06-05 LAB — FERRITIN: Ferritin: 1190 ng/mL — ABNORMAL HIGH (ref 11–307)

## 2019-06-05 LAB — LACTATE DEHYDROGENASE: LDH: 320 U/L — ABNORMAL HIGH (ref 98–192)

## 2019-06-05 LAB — C-REACTIVE PROTEIN: CRP: 0.6 mg/dL (ref <1.0)

## 2019-06-05 LAB — BRAIN NATRIURETIC PEPTIDE: B Natriuretic Peptide: 994.8 pg/mL — ABNORMAL HIGH (ref 0.0–100.0)

## 2019-06-05 LAB — D-DIMER, QUANTITATIVE: D-Dimer, Quant: 9.99 ug/mL-FEU — ABNORMAL HIGH (ref 0.00–0.50)

## 2019-06-05 LAB — LACTIC ACID, PLASMA
Lactic Acid, Venous: 0.8 mmol/L (ref 0.5–1.9)
Lactic Acid, Venous: 0.7 mmol/L (ref 0.5–1.9)

## 2019-06-05 LAB — FIBRINOGEN: Fibrinogen: 375 mg/dL (ref 210–475)

## 2019-06-05 LAB — TROPONIN I (HIGH SENSITIVITY)
Troponin I (High Sensitivity): 40 ng/L — ABNORMAL HIGH (ref <18)
Troponin I (High Sensitivity): 42 ng/L — ABNORMAL HIGH (ref <18)
Troponin I (High Sensitivity): 33 ng/L — ABNORMAL HIGH (ref <18)

## 2019-06-05 LAB — PROCALCITONIN: Procalcitonin: <0.1 ng/mL

## 2019-06-05 LAB — PROTIME-INR
INR: 1 (ref 0.8–1.2)
Prothrombin Time: 13.3 seconds (ref 11.4–15.2)

## 2019-06-05 MED ORDER — HYDROCOD POLST-CPM POLST ER 10-8 MG/5ML PO SUER: 5.0000 mL | Freq: Two times a day (BID) | ORAL | Status: DC | PRN

## 2019-06-05 MED ORDER — CALCITRIOL 0.25 MCG PO CAPS
0.2500 ug | ORAL_CAPSULE | ORAL | Status: DC
  Administered 2019-06-07: 0.25 ug via ORAL
  Filled 2019-06-05: qty 1

## 2019-06-05 MED ORDER — SODIUM CHLORIDE 0.9 % IV SOLN
100.0000 mg | INTRAVENOUS | Status: AC
  Administered 2019-06-05 (×2): 100 mg via INTRAVENOUS
  Filled 2019-06-05 (×2): qty 20

## 2019-06-05 MED ORDER — ASPIRIN EC 81 MG PO TBEC
81.0000 mg | DELAYED_RELEASE_TABLET | Freq: Every day | ORAL | Status: DC
  Administered 2019-06-05 – 2019-06-09 (×5): 81 mg via ORAL
  Filled 2019-06-05 (×5): qty 1

## 2019-06-05 MED ORDER — SPIRONOLACTONE 25 MG PO TABS: 25.0000 mg | ORAL_TABLET | Freq: Every day | ORAL | Status: DC

## 2019-06-05 MED ORDER — ENOXAPARIN SODIUM 30 MG/0.3ML ~~LOC~~ SOLN: 30.0000 mg | SUBCUTANEOUS | Status: DC

## 2019-06-05 MED ORDER — SODIUM CHLORIDE 0.9 % IV SOLN
500.0000 mg | Freq: Once | INTRAVENOUS | Status: AC
  Administered 2019-06-05: 500 mg via INTRAVENOUS
  Filled 2019-06-05: qty 500

## 2019-06-05 MED ORDER — CALCIUM CARBONATE ANTACID 1250 MG/5ML PO SUSP
500.0000 mg | Freq: Four times a day (QID) | ORAL | Status: DC | PRN
  Administered 2019-06-07: 500 mg via ORAL
  Filled 2019-06-05: qty 5

## 2019-06-05 MED ORDER — FLEET ENEMA 7-19 GM/118ML RE ENEM: 1.0000 | ENEMA | Freq: Once | RECTAL | Status: DC | PRN

## 2019-06-05 MED ORDER — ACETAMINOPHEN 325 MG PO TABS
650.0000 mg | ORAL_TABLET | Freq: Four times a day (QID) | ORAL | Status: DC | PRN
  Administered 2019-06-06: 650 mg via ORAL
  Filled 2019-06-05: qty 2

## 2019-06-05 MED ORDER — SODIUM CHLORIDE 0.9 % IV SOLN
100.0000 mg | Freq: Every day | INTRAVENOUS | Status: AC
  Administered 2019-06-06 – 2019-06-09 (×4): 100 mg via INTRAVENOUS
  Filled 2019-06-05 (×4): qty 20

## 2019-06-05 MED ORDER — ONDANSETRON HCL 4 MG/2ML IJ SOLN: 4.0000 mg | Freq: Four times a day (QID) | INTRAMUSCULAR | Status: DC | PRN

## 2019-06-05 MED ORDER — SODIUM CHLORIDE 0.9 % IV SOLN: 100.0000 mg | Freq: Every day | INTRAVENOUS | Status: DC

## 2019-06-05 MED ORDER — SODIUM CHLORIDE 0.9 % IV SOLN: 200.0000 mg | Freq: Once | INTRAVENOUS | Status: DC

## 2019-06-05 MED ORDER — CARVEDILOL 25 MG PO TABS
25.0000 mg | ORAL_TABLET | Freq: Two times a day (BID) | ORAL | Status: DC
  Administered 2019-06-05 – 2019-06-09 (×9): 25 mg via ORAL
  Filled 2019-06-05: qty 1
  Filled 2019-06-05: qty 2
  Filled 2019-06-05 (×7): qty 1

## 2019-06-05 MED ORDER — SORBITOL 70 % SOLN
30.0000 mL | Status: DC | PRN
  Filled 2019-06-05: qty 30

## 2019-06-05 MED ORDER — AMLODIPINE BESYLATE 5 MG PO TABS
5.0000 mg | ORAL_TABLET | Freq: Every day | ORAL | Status: DC
  Administered 2019-06-05 – 2019-06-08 (×4): 5 mg via ORAL
  Filled 2019-06-05 (×4): qty 1

## 2019-06-05 MED ORDER — SODIUM CHLORIDE 0.9% FLUSH: 3.0000 mL | INTRAVENOUS | Status: DC | PRN

## 2019-06-05 MED ORDER — FUROSEMIDE 10 MG/ML IJ SOLN
60.0000 mg | Freq: Once | INTRAMUSCULAR | Status: AC
  Administered 2019-06-05: 60 mg via INTRAVENOUS
  Filled 2019-06-05: qty 6

## 2019-06-05 MED ORDER — HYDROXYZINE HCL 25 MG PO TABS: 25.0000 mg | ORAL_TABLET | Freq: Three times a day (TID) | ORAL | Status: DC | PRN

## 2019-06-05 MED ORDER — HEPARIN SODIUM (PORCINE) 10000 UNIT/ML IJ SOLN
7500.0000 [IU] | Freq: Three times a day (TID) | INTRAMUSCULAR | Status: DC
  Administered 2019-06-05 – 2019-06-07 (×6): 7500 [IU] via SUBCUTANEOUS
  Filled 2019-06-05 (×9): qty 1

## 2019-06-05 MED ORDER — SODIUM CHLORIDE 0.9 % IV SOLN
1.0000 g | Freq: Once | INTRAVENOUS | Status: AC
  Administered 2019-06-05: 1 g via INTRAVENOUS
  Filled 2019-06-05: qty 10

## 2019-06-05 MED ORDER — SODIUM CHLORIDE 0.9 % IV SOLN: 250.0000 mL | INTRAVENOUS | Status: DC | PRN

## 2019-06-05 MED ORDER — NEPRO/CARBSTEADY PO LIQD: 237.0000 mL | Freq: Three times a day (TID) | ORAL | Status: DC | PRN

## 2019-06-05 MED ORDER — POTASSIUM CHLORIDE CRYS ER 20 MEQ PO TBCR
40.0000 meq | EXTENDED_RELEASE_TABLET | Freq: Once | ORAL | Status: AC
  Administered 2019-06-05: 40 meq via ORAL
  Filled 2019-06-05: qty 2

## 2019-06-05 MED ORDER — SODIUM CHLORIDE 0.9% FLUSH
3.0000 mL | Freq: Two times a day (BID) | INTRAVENOUS | Status: DC
  Administered 2019-06-05 – 2019-06-09 (×8): 3 mL via INTRAVENOUS

## 2019-06-05 MED ORDER — CARVEDILOL 25 MG PO TABS: 25.0000 mg | ORAL_TABLET | Freq: Two times a day (BID) | ORAL | Status: DC

## 2019-06-05 MED ORDER — POLYVINYL ALCOHOL 1.4 % OP SOLN
1.0000 [drp] | OPHTHALMIC | Status: DC | PRN
  Filled 2019-06-05: qty 15

## 2019-06-05 MED ORDER — BISACODYL 5 MG PO TBEC: 5.0000 mg | DELAYED_RELEASE_TABLET | Freq: Every day | ORAL | Status: DC | PRN

## 2019-06-05 MED ORDER — METOPROLOL TARTRATE 5 MG/5ML IV SOLN
2.5000 mg | Freq: Once | INTRAVENOUS | Status: AC
  Administered 2019-06-05: 2.5 mg via INTRAVENOUS
  Filled 2019-06-05: qty 5

## 2019-06-05 MED ORDER — FUROSEMIDE 10 MG/ML IJ SOLN
40.0000 mg | Freq: Two times a day (BID) | INTRAMUSCULAR | Status: DC
  Administered 2019-06-05 – 2019-06-07 (×4): 40 mg via INTRAVENOUS
  Filled 2019-06-05 (×4): qty 4

## 2019-06-05 MED ORDER — DOCUSATE SODIUM 283 MG RE ENEM
1.0000 | ENEMA | RECTAL | Status: DC | PRN
  Filled 2019-06-05: qty 1

## 2019-06-05 MED ORDER — ENOXAPARIN SODIUM 40 MG/0.4ML ~~LOC~~ SOLN: 0.5000 mg/kg | Freq: Two times a day (BID) | SUBCUTANEOUS | Status: DC

## 2019-06-05 MED ORDER — ZOLPIDEM TARTRATE 5 MG PO TABS: 5.0000 mg | ORAL_TABLET | Freq: Every evening | ORAL | Status: DC | PRN

## 2019-06-05 MED ORDER — OXYCODONE HCL 5 MG PO TABS: 5.0000 mg | ORAL_TABLET | ORAL | Status: DC | PRN

## 2019-06-05 MED ORDER — CAMPHOR-MENTHOL 0.5-0.5 % EX LOTN
1.0000 "application " | TOPICAL_LOTION | Freq: Three times a day (TID) | CUTANEOUS | Status: DC | PRN
  Filled 2019-06-05: qty 222

## 2019-06-05 MED ORDER — ONDANSETRON HCL 4 MG PO TABS: 4.0000 mg | ORAL_TABLET | Freq: Four times a day (QID) | ORAL | Status: DC | PRN

## 2019-06-05 MED ORDER — SODIUM CHLORIDE 0.9% FLUSH
3.0000 mL | Freq: Two times a day (BID) | INTRAVENOUS | Status: DC
  Administered 2019-06-05 – 2019-06-09 (×7): 3 mL via INTRAVENOUS

## 2019-06-05 MED ORDER — POLYETHYLENE GLYCOL 3350 17 G PO PACK: 17.0000 g | PACK | Freq: Every day | ORAL | Status: DC | PRN

## 2019-06-05 MED ORDER — DEXAMETHASONE SODIUM PHOSPHATE 10 MG/ML IJ SOLN
6.0000 mg | INTRAMUSCULAR | Status: DC
  Administered 2019-06-05 – 2019-06-09 (×5): 6 mg via INTRAVENOUS
  Filled 2019-06-05 (×5): qty 1

## 2019-06-05 MED ORDER — TECHNETIUM TO 99M ALBUMIN AGGREGATED
1.5200 | Freq: Once | INTRAVENOUS | Status: AC | PRN
  Administered 2019-06-05: 1.52 via INTRAVENOUS

## 2019-06-05 MED ORDER — GUAIFENESIN-DM 100-10 MG/5ML PO SYRP: 10.0000 mL | ORAL_SOLUTION | ORAL | Status: DC | PRN

## 2019-06-05 MED ORDER — PROPYLENE GLYCOL 0.6 % OP SOLN: 1.0000 [drp] | Freq: Every day | OPHTHALMIC | Status: DC | PRN

## 2019-06-05 MED ORDER — ALBUTEROL SULFATE HFA 108 (90 BASE) MCG/ACT IN AERS
2.0000 | INHALATION_SPRAY | RESPIRATORY_TRACT | Status: DC | PRN
  Filled 2019-06-05: qty 6.7

## 2019-06-05 NOTE — ED Triage Notes (Signed)
Pt brought to ED by [facility] from home for c;/o SOB for a week and edema in lower extremities. BP 176/100, Hr 100, R 20, 97%RA oxygen dependent at home on 2 L. Pt denies any pain.

## 2019-06-05 NOTE — H&P (Signed)
History and Physical    [name] AYT:[id number] DOB: 1946-02-14 DOA: 06/05/2019  PCP: Hoyt Koch, MD Consultants:  [facility] - cardiology; [facility] - nephrology; [facility] Gum - CV surgery Patient coming from:  Home - lives with husband; NOK: [place], [place], [phone]  Chief Complaint: SOB  HPI: [name] is a 74 y.o. female with medical history significant of pulmonary HTN; HTN; stage 3 CKD; s/p bioprosthetic AVR with AAA repair (05/2017); and chronic diastolic CHF presenting with SOB.  She reports that her sister-in-law got COVID at the beginning of the month.  Her husband's brother has frequent breakfast with her husband and brings them lunch every day.  He subsequently tested positive but generally didn't have symptoms and so apparently continued to bring lunch.  The patient's husband then got sick over a weekend but was never tested and has since recovered.  Meanwhile, she developed cough around the same time.  She was treated with Doxy but did not complete the course.  She has had persistent symptoms since the beginning of the month but they acutely worsened the last few days with worsening SOB.  She changed her Lasix dose but did not get better and so came in for further evaluation.   ED Course:  COVID +.  Cough, chest congestion.  Treated with doxy, couldn't finish.  More SOB this week, took extra Lasix with some improvement (previously on 80 TID, decreased due to renal function).  CXR with ?CAP - given Rocephin, Azithromycin.  Possibly also some CHF.   Review of Systems: As per HPI; otherwise review of systems reviewed and negative.    Past Medical History:  Diagnosis Date  . Acute pancreatitis 02/04/2017   [name] 02/04/2017  . Adenomatous colon polyp 01/2002  . Anemia, chronic disease    /notes 02/04/2017  . Aortic valve disease    AI/AS  . Arthritis    "lower back, knees" (02/04/2017)  . CHF (congestive heart failure) ([place])   . CKD (chronic kidney  disease), stage III   . Diverticulosis of colon   . DJD (degenerative joint disease)   . Frequent UTI   . Hemorrhoids   . Hypertension   . Lumbar back pain   . Neuropathy   . Obesity   . Pericardial effusion     in a patient with Diastolic heart  failure and Pericardial effusion  known since last 2 D echo in 11/2016 /notes 02/04/2017  . PONV (postoperative nausea and vomiting)   . Pulmonary HTN ([place])   . Renal cyst   . Venous insufficiency   . Vitamin D deficiency     Past Surgical History:  Procedure Laterality Date  . ABDOMINAL HYSTERECTOMY     "partial"  . AORTIC VALVE REPLACEMENT N/A 05/30/2017   Procedure: AORTIC VALVE REPLACEMENT (AVR);  Surgeon: Prescott Gum, Collier Salina, MD;  Location: [place];  Service: Vascular;  Laterality: N/A;  . COLONOSCOPY W/ BIOPSIES AND POLYPECTOMY     "bx was ok"  . IR THORACENTESIS ASP PLEURAL SPACE W/IMG GUIDE  06/11/2017  . IR THORACENTESIS ASP PLEURAL SPACE W/IMG GUIDE  06/12/2017  . REPAIR OF ACUTE ASCENDING THORACIC AORTIC DISSECTION N/A 05/30/2017   Procedure: REPLACEMENT OF ASCENDING AORTIC ANEURYSM AND REPAIRED CHRONIC ROOT DISSECTION WITH CIRC ARREST;  Surgeon: Ivin Poot, MD;  Location: [place];  Service: Vascular;  Laterality: N/A;  . TEE WITHOUT CARDIOVERSION N/A 05/28/2017   Procedure: TRANSESOPHAGEAL ECHOCARDIOGRAM (TEE);  Surgeon: Skeet Latch, MD;  Location: [place];  Service: Cardiovascular;  Laterality: N/A;  . TEE WITHOUT CARDIOVERSION N/A 05/30/2017   Procedure: TRANSESOPHAGEAL ECHOCARDIOGRAM (TEE);  Surgeon: Prescott Gum, Collier Salina, MD;  Location: [place];  Service: Open Heart Surgery;  Laterality: N/A;    Social History   Socioeconomic History  . Marital status: Married    Spouse name: Juanda Crumble  . Number of children: 1  . Years of education: 32  . Highest education level: Not on file  Occupational History  . Occupation: retired from Administrator, [employer]  Tobacco Use  . Smoking status: Former Smoker    Packs/day: 0.40    Years: 15.00      Pack years: 6.00    Types: Cigarettes    Quit date: 04/08/1982    Years since quitting: 37.1  . Smokeless tobacco: Never Used  Substance and Sexual Activity  . Alcohol use: No  . Drug use: No  . Sexual activity: Never  Other Topics Concern  . Not on file  Social History Narrative   Lives at home w/ her husband   Right-handed   Daily caffeine    Social Determinants of Health   Financial Resource Strain:   . Difficulty of Paying Living Expenses: Not on file  Food Insecurity:   . Worried About Charity fundraiser in the Last Year: Not on file  . Ran Out of Food in the Last Year: Not on file  Transportation Needs:   . Lack of Transportation (Medical): Not on file  . Lack of Transportation (Non-Medical): Not on file  Physical Activity:   . Days of Exercise per Week: Not on file  . Minutes of Exercise per Session: Not on file  Stress:   . Feeling of Stress : Not on file  Social Connections:   . Frequency of Communication with Friends and Family: Not on file  . Frequency of Social Gatherings with Friends and Family: Not on file  . Attends Religious Services: Not on file  . Active Member of Clubs or Organizations: Not on file  . Attends Archivist Meetings: Not on file  . Marital Status: Not on file  Intimate Partner Violence:   . Fear of Current or Ex-Partner: Not on file  . Emotionally Abused: Not on file  . Physically Abused: Not on file  . Sexually Abused: Not on file    Allergies  Allergen Reactions  . Minoxidil Palpitations and Other (See Comments)    unable to sleep  . Naproxen Swelling    Family History  Problem Relation Age of Onset  . Diabetes Mother   . Colon polyps Mother   . Hypertension Mother   . Hypertension Brother   . Hypertension Sister   . Colon cancer Neg Hx     Prior to Admission medications   Medication Sig Start Date End Date Taking? Authorizing Provider  amLODipine (NORVASC) 5 MG tablet Take 5 mg by mouth at bedtime. 05/07/18    [provider]  aspirin EC 81 MG tablet Take 81 mg by mouth daily.    [provider]  Baclofen 5 MG TABS Take 2 tablets by mouth 2 (two) times daily as needed (neck cramps).    [provider]  calcitRIOL (ROCALTROL) 0.25 MCG capsule Take 0.25 mcg by mouth. Mondays and fridays    [provider]  calcium carbonate (TUMS - DOSED IN MG ELEMENTAL CALCIUM) 500 MG chewable tablet Chew 2 tablets by mouth daily as needed for indigestion or heartburn.    [provider]  carvedilol (COREG)  25 MG tablet Take 1 tablet (25 mg total) by mouth 2 (two) times daily with a meal. 07/16/18   Aline August, MD  doxycycline (VIBRA-TABS) 100 MG tablet Take 1 tablet (100 mg total) by mouth 2 (two) times daily. 05/11/19   Marrian Salvage, FNP  FAMOTIDINE PO Take 1 tablet by mouth 2 (two) times daily as needed (gas).    [provider]  fluticasone (FLONASE) 50 MCG/ACT nasal spray Place 2 sprays into both nostrils daily as needed for allergies. 07/16/18   Aline August, MD  furosemide (LASIX) 20 MG tablet Take two tablets in the morning and take one tablet in the evening. 01/14/19   Minus Breeding, MD  gabapentin (NEURONTIN) 300 MG capsule TAKE 1 CAPSULE(300 MG) BY MOUTH THREE TIMES DAILY AS NEEDED FOR PAIN Patient taking differently: 300 mg 3 (three) times daily as needed (pain).  05/22/18   Hoyt Koch, MD  hydrocortisone (ANUSOL-HC) 2.5 % rectal cream Place 1 application rectally as needed. 05/24/13   Noralee Space, MD  ondansetron (ZOFRAN) 4 MG tablet Take 1 tablet (4 mg total) by mouth every 8 (eight) hours as needed for nausea or vomiting. 07/16/18   Aline August, MD  potassium chloride SA (K-DUR,KLOR-CON) 20 MEQ tablet Take 20 mEq by mouth daily.     [provider]  promethazine-dextromethorphan (PROMETHAZINE-DM) 6.25-15 MG/5ML syrup Take 5 mLs by mouth 4 (four) times daily as needed for cough. 05/20/19   Hoyt Koch, MD   Propylene Glycol (SYSTANE BALANCE) 0.6 % SOLN Place 1 drop into both eyes daily as needed (for dry eyes).    [provider]  spironolactone (ALDACTONE) 25 MG tablet Take 1 tablet (25 mg total) by mouth daily. 08/10/18   Minus Breeding, MD  triamcinolone ointment (KENALOG) 0.5 % Apply 1 application topically daily as needed (itching). 06/16/17   Nani Skillern, PA-C    Physical Exam: Vitals:   06/05/19 1015 06/05/19 1030 06/05/19 1045 06/05/19 1100  BP: (!) 152/92 (!) 169/90 (!) 168/95 (!) 155/84  Pulse: 84 81 80 77  Resp: (!) 26 (!) 26 (!) 22 20  Temp:      TempSrc:      SpO2: 97% 97% 98% 97%  Weight:      Height:         . General:  Appears calm and comfortable and is NAD, very conversant . Eyes:  PERRL, EOMI, normal lids, iris . ENT:  grossly normal hearing, lips & tongue, mmm; suboptimal dentition with some absent dentition . Neck:  no LAD, masses or thyromegaly . Cardiovascular:  RRR, no m/r/g. 2+ LE edema.  Marland Kitchen Respiratory:   CTA bilaterally with no wheezes/rales/rhonchi.  Normal to mildly increased respiratory effort. . Abdomen:  soft, NT, ND, NABS . Back:   normal alignment, no CVAT . Skin:  no rash or induration seen on limited exam . Musculoskeletal:  grossly normal tone BUE/BLE, good ROM, no bony abnormality . Psychiatric:  grossly normal mood and affect, speech fluent and appropriate, AOx3 . Neurologic:  CN 2-12 grossly intact, moves all extremities in coordinated fashion    Radiological Exams on Admission: DG Chest Port 1 View  Result Date: 06/05/2019 CLINICAL DATA:  Difficulty breathing for 1 week. EXAM: PORTABLE CHEST 1 VIEW COMPARISON:  07/15/2018 FINDINGS: 0733 hours. The cardio pericardial silhouette is enlarged. There is pulmonary vascular congestion without overt pulmonary edema. New right parahilar airspace disease noted with similar retrocardiac chronic atelectasis or scarring. The visualized bony structures  of the thorax are intact. Telemetry  leads overlie the chest. IMPRESSION: 1. New right parahilar airspace disease compatible with pneumonia. 2. Stable retrocardiac atelectasis or scarring. 3. Pulmonary vascular congestion without overt pulmonary edema. Electronically Signed   By: Misty Stanley M.D.   On: 06/05/2019 07:55    EKG: Independently reviewed.  NSR with rate 99; LBBB with NSCSLT    Labs on Admission: I have personally reviewed the available labs and imaging studies at the time of the admission.  Pertinent labs:   ABG: 7.522/34.1/152.0 K+ 3.4 BUN 47/Creatinine 2.35/GFR 23 at baseline Albumin 3.2 BNP 994.8 HS troponin 40, 42 Lactate 0.8 WBC 8.0 Hgb 9.4 INR 1.0 COVID POSITIVE LDH 320 Ferritin 1190 CRP 0.6 Lactate 0.7 Procalcitonin <0.10 D-dimer 9.99 Fibrinogen 375   Assessment/Plan Principal Problem:   Acute respiratory disease due to COVID-19 virus Active Problems:   Essential hypertension   Chronic diastolic CHF (congestive heart failure) (HCC)   History of aortic valve replacement with bioprosthetic valve   CKD (chronic kidney disease), stage IV (HCC)   Acute respiratory failure with hypoxia due to COVID-19 -Patient with presenting with SOB and persistent cough at home -Mild anorexia noted without the presence of other GI symptoms -She does not have a usual home O2 requirement and is currently requiring 2L [place] O2 -COVID POSITIVE -The patient has comorbidities which may increase the risk for ARDS/MODS including: age, HTN -Pertinent labs concerning for COVID include normal WBC count; increased but stable BUN/Creatinine; increased LDH; markedly elevated D-dimer (>>1); markedly increased ferritin; low procalcitonin -CXR with multifocal opacities which may be c/w COVID vs. PNA  -Will not treat with broad-spectrum antibiotics given procalcitonin <0.1 -Given markedly elevated D-dimer, she needs to be ruled out for PE; her renal function will not support CTA so will order VQ scan -Will admit for further  evaluation, close monitoring, and treatment -Monitor on telemetry x at least 24 hours -At this time, will attempt to avoid use of aerosolized medications and use HFAs instead -Will check daily labs including BMP with Mag, Phos; LFTs; CBC with differential; CRP; ferritin; fibrinogen; D-dimer -Will check echo since patient has troponin elevation; low suspicion for ACS at this time -Will order steroids and Remdesivir (pharmacy consult) given +COVID test, +CXR, and hypoxia <94% on room air -If the patient shows clinical deterioration, consider transfer to ICU with PCCM consultation -Will attempt to maintain euvolemia to a net negative fluid status -Will ask the patient to maintain an awake prone position for 16+ hours a day, if possible, with a minimum of 2-3 hours at a time -With D-dimer >5, will use weight-based Lovenox prophylaxis at this time (0.5 mg/kg q12h) - of note, given altered renal function, pharmacy was asked to assist with this and may change her to Heparin dosing instead -Patient was seen wearing full PPE including: gown, gloves, head cover, N95, and face shield; donning and doffing was in compliance with current standards.  Chronic diastolic CHF -Patient with prior 05/2017 echo with preserved EF and grade 1 diastolic dysfunction with acute aortic dissection which was repaired on 05/30/17 -She was due for repeat Echo on 07/14/19 -She was last seen by Dr. Percival Spanish on 01/13/19 and labs showed worsening renal function; Lasix was decreased to 40 mg qAM and 20 mg qPM -She reports DOE and orthopnea with cough and hypoxia; this could all be related to COVID, to CHF, or to both -For now, will treat as though both are contributors -She was given Lasix 60 mg IV  x 1 in the ER -Will continue with 40 mg IV BID for now - with careful monitoring since she will be better off net negative with COVID but is tenuous with CKD and CHF -Will repeat Echo now -Dr. Percival Spanish has been notified of admission and  briefed on plans but has not been asked to consult at this time; he is happy to assist if needed -Will continue spironolactone  Stage IV CKD -As noted above, tenuous situation between need for diuresis due to CHF and advanced CKD -Will follow with daily BMP -Continue Calcitriol -Nephrology prn order set was used  HTN -Continue Norvasc, Coreg  S/p AVR -Continue ASA    DVT prophylaxis:  Heparin Code Status:  Full - confirmed with patient Family Communication: None present; I spoke with the patient's husband by telephone. Disposition Plan:  She is anticipated to d/c to home without [facility] services once her respiratory issues have been resolved.  She may require home O2 at the time of discharge. Consults called: None  Admission status: Admit - It is my clinical opinion that admission to INPATIENT is reasonable and necessary because of the expectation that this patient will require hospital care that crosses at least 2 midnights to treat this condition based on the medical complexity of the problems presented.  Given the aforementioned information, the predictability of an adverse outcome is felt to be significant.     Karmen Bongo MD [facility]   How to contact the [facility] Attending or Consulting provider [place] or covering provider during after hours [place], for this patient?  1. Check the care team in [facility] and look for a) attending/consulting [facility] provider listed and b) the [facility] team listed 2. Log into [email or web address] and use [place]'s universal password to access. If you do not have the password, please contact the hospital operator. 3. Locate the [facility] provider you are looking for under [facility] and page to a number that you can be directly reached. 4. If you still have difficulty reaching the provider, please page the [facility] (Director on Call) for the Hospitalists listed on amion for assistance.   06/05/2019, 1:35 PM

## 2019-06-05 NOTE — Progress Notes (Signed)
  Echocardiogram 2D Echocardiogram has been performed.  [name] 06/05/2019, 6:06 PM

## 2019-06-05 NOTE — Progress Notes (Signed)
  Echocardiogram 2D Echocardiogram has been performed.  [name] 06/05/2019, 5:41 PM

## 2019-06-05 NOTE — Plan of Care (Signed)
Respiratory care plan has been added to patient profile.

## 2019-06-05 NOTE — ED Provider Notes (Addendum)
[facility] EMERGENCY DEPARTMENT Provider Note   CSN: [id number] Arrival date & time: 06/05/19  0701     History Chief Complaint  Patient presents with  . Shortness of Breath    [name] is a 74 y.o. female.  HPI Patient has been having problems with cough and shortness of breath since the beginning of February now.  She reports initially she felt like she had a lot of congestion in her nose and sinuses.  She was able to clear that out but she never felt like she could really cough up a little bit of phlegm or congestion in her chest that seem to persist.  She did see her doctor earlier in the month and was prescribed doxycycline and Tessalon Perles.  Patient reports that when she gets sick, she has a really difficult time taking medications because she gets very nauseated.  She reports she never was able to take the course of doxycycline.  She reports she requested a liquid form but her provider wanted to see her back in the office before choosing another therapy.  The patient reports that she has never had a fever throughout this.  Her temperatures have remained at 96 or 98.  No chills or body aches.  She reports that she felt like she had some fluid buildup from her congestive heart failure.  She reports she had been on a very high dose of Lasix up to 80 mg 3 times a day at 1 point but Dr. Percival Spanish had her decrease that because of increasing kidney problems she reports.  She reports she went down to a total of 60 mg a day, taking 40 in the morning and 20 in the evening.  Since her shortness of breath was somewhat worse, she increased her Lasix dose 3 days ago and took 80 mg in the morning and 80 in the evening.  Yesterday she took 80 mg in the morning and 80 mg in the afternoon.  She reports it seems somewhat helpful.  She reports he is making plenty of urine when she takes Lasix.  She is not having any pain or burning when she urinates.  She does however remain pretty  short of breath if she tries to lie down flat or with any exertion.  She reports she persistently has swelling in her legs.  Left leg swells more than right.  That has been a persistent issue now for a number of months.  She denies it is painful.    Past Medical History:  Diagnosis Date  . Acute pancreatitis 02/04/2017   [name] 02/04/2017  . Adenomatous colon polyp 01/2002  . Anemia, chronic disease    /notes 02/04/2017  . Aortic valve disease    AI/AS  . Arthritis    "lower back, knees" (02/04/2017)  . CHF (congestive heart failure) ([place])   . CKD (chronic kidney disease), stage III   . Diverticulosis of colon   . DJD (degenerative joint disease)   . Frequent UTI   . Hemorrhoids   . Hypertension   . Lumbar back pain   . Neuropathy   . Obesity   . Pericardial effusion     in a patient with Diastolic heart  failure and Pericardial effusion  known since last 2 D echo in 11/2016 /notes 02/04/2017  . PONV (postoperative nausea and vomiting)   . Pulmonary HTN ([place])   . Renal cyst   . Venous insufficiency   . Vitamin D deficiency  Patient Active Problem List   Diagnosis Date Noted  . Degenerative arthritis of knee, bilateral 04/06/2019  . Encounter for general adult medical examination with abnormal findings 02/26/2019  . Degenerative disc disease, cervical 12/12/2018  . CKD (chronic kidney disease), stage IV ([place]) 08/10/2018  . Leg swelling 11/24/2017  . Bilateral leg weakness 06/26/2017  . S/P AVR 05/30/2017  . Ascending aortic dissection ([place])   . Chronic diastolic CHF (congestive heart failure) ([place]) 04/19/2017  . Nonrheumatic aortic valve insufficiency 03/10/2017  . Pulmonary HTN ([place]) 03/10/2017  . Neck pain 02/11/2017  . Pericardial effusion 02/04/2017  . Abnormal bone marrow examination 01/24/2017  . Stenosis of right carotid artery 08/27/2016  . LBBB (left bundle branch block) 08/27/2016  . Left-sided tinnitus 08/16/2016  . Anemia in chronic kidney disease  11/02/2015  . Complicated UTI (urinary tract infection) 04/26/2014  . OA (osteoarthritis) of knee 03/23/2012  . Vitamin D deficiency 04/13/2008  . OBESITY 02/23/2008  . Radiculopathy 02/23/2008  . Essential hypertension 02/23/2008  . Venous (peripheral) insufficiency 02/23/2008    Past Surgical History:  Procedure Laterality Date  . ABDOMINAL HYSTERECTOMY     "partial"  . AORTIC VALVE REPLACEMENT N/A 05/30/2017   Procedure: AORTIC VALVE REPLACEMENT (AVR);  Surgeon: Prescott Gum, Collier Salina, MD;  Location: [facility];  Service: Vascular;  Laterality: N/A;  . COLONOSCOPY W/ BIOPSIES AND POLYPECTOMY     "bx was ok"  . IR THORACENTESIS ASP PLEURAL SPACE W/IMG GUIDE  06/11/2017  . IR THORACENTESIS ASP PLEURAL SPACE W/IMG GUIDE  06/12/2017  . REPAIR OF ACUTE ASCENDING THORACIC AORTIC DISSECTION N/A 05/30/2017   Procedure: REPLACEMENT OF ASCENDING AORTIC ANEURYSM AND REPAIRED CHRONIC ROOT DISSECTION WITH CIRC ARREST;  Surgeon: Ivin Poot, MD;  Location: [facility];  Service: Vascular;  Laterality: N/A;  . TEE WITHOUT CARDIOVERSION N/A 05/28/2017   Procedure: TRANSESOPHAGEAL ECHOCARDIOGRAM (TEE);  Surgeon: Skeet Latch, MD;  Location: [facility];  Service: Cardiovascular;  Laterality: N/A;  . TEE WITHOUT CARDIOVERSION N/A 05/30/2017   Procedure: TRANSESOPHAGEAL ECHOCARDIOGRAM (TEE);  Surgeon: Prescott Gum, Collier Salina, MD;  Location: [facility];  Service: Open Heart Surgery;  Laterality: N/A;     OB History   No obstetric history on file.     Family History  Problem Relation Age of Onset  . Diabetes Mother   . Colon polyps Mother   . Hypertension Mother   . Hypertension Brother   . Hypertension Sister   . Colon cancer Neg Hx     Social History   Tobacco Use  . Smoking status: Former Smoker    Packs/day: 0.40    Years: 15.00    Pack years: 6.00    Types: Cigarettes    Quit date: 04/08/1982    Years since quitting: 37.1  . Smokeless tobacco: Never Used  Substance Use Topics  . Alcohol use: No  .  Drug use: No    Home Medications Prior to Admission medications   Medication Sig Start Date End Date Taking? Authorizing Provider  amLODipine (NORVASC) 5 MG tablet Take 5 mg by mouth at bedtime. 05/07/18   [provider]  aspirin EC 81 MG tablet Take 81 mg by mouth daily.    [provider]  Baclofen 5 MG TABS Take 2 tablets by mouth 2 (two) times daily as needed (neck cramps).    [provider]  calcitRIOL (ROCALTROL) 0.25 MCG capsule Take 0.25 mcg by mouth. Mondays and fridays    [provider]  calcium carbonate (TUMS -  DOSED IN MG ELEMENTAL CALCIUM) 500 MG chewable tablet Chew 2 tablets by mouth daily as needed for indigestion or heartburn.    [provider]  carvedilol (COREG) 25 MG tablet Take 1 tablet (25 mg total) by mouth 2 (two) times daily with a meal. 07/16/18   Aline August, MD  doxycycline (VIBRA-TABS) 100 MG tablet Take 1 tablet (100 mg total) by mouth 2 (two) times daily. 05/11/19   Marrian Salvage, FNP  FAMOTIDINE PO Take 1 tablet by mouth 2 (two) times daily as needed (gas).    [provider]  fluticasone (FLONASE) 50 MCG/ACT nasal spray Place 2 sprays into both nostrils daily as needed for allergies. 07/16/18   Aline August, MD  furosemide (LASIX) 20 MG tablet Take two tablets in the morning and take one tablet in the evening. 01/14/19   Minus Breeding, MD  gabapentin (NEURONTIN) 300 MG capsule TAKE 1 CAPSULE(300 MG) BY MOUTH THREE TIMES DAILY AS NEEDED FOR PAIN Patient taking differently: 300 mg 3 (three) times daily as needed (pain).  05/22/18   Hoyt Koch, MD  hydrocortisone (ANUSOL-HC) 2.5 % rectal cream Place 1 application rectally as needed. 05/24/13   Noralee Space, MD  ondansetron (ZOFRAN) 4 MG tablet Take 1 tablet (4 mg total) by mouth every 8 (eight) hours as needed for nausea or vomiting. 07/16/18   Aline August, MD  potassium chloride SA (K-DUR,KLOR-CON) 20 MEQ tablet Take 20 mEq by mouth  daily.     [provider]  promethazine-dextromethorphan (PROMETHAZINE-DM) 6.25-15 MG/5ML syrup Take 5 mLs by mouth 4 (four) times daily as needed for cough. 05/20/19   Hoyt Koch, MD  Propylene Glycol (SYSTANE BALANCE) 0.6 % SOLN Place 1 drop into both eyes daily as needed (for dry eyes).    [provider]  spironolactone (ALDACTONE) 25 MG tablet Take 1 tablet (25 mg total) by mouth daily. 08/10/18   Minus Breeding, MD  triamcinolone ointment (KENALOG) 0.5 % Apply 1 application topically daily as needed (itching). 06/16/17   Nani Skillern, PA-C    Allergies    Minoxidil and Naproxen  Review of Systems   Review of Systems 10 Systems reviewed and are negative for acute change except as noted in the HPI.  Physical Exam Updated Vital Signs BP (!) 175/94   Pulse 90   Temp 98.1 F (36.7 C) (Oral)   Resp (!) 31   Ht 5\' 5"  (1.651 m)   Wt 78 kg   SpO2 97%   BMI 28.62 kg/m   Physical Exam Constitutional:      Comments: Alert with clear mental status.  No respiratory distress at rest.  HENT:     Head: Normocephalic and atraumatic.     Mouth/Throat:     Mouth: Mucous membranes are moist.     Pharynx: Oropharynx is clear.  Eyes:     Extraocular Movements: Extraocular movements intact.     Conjunctiva/sclera: Conjunctivae normal.  Cardiovascular:     Comments: Heart is regular. Tachycardia.  3\6 systolic ejection murmur. Pulmonary:     Comments: No respiratory distress at rest.  Fine basilar crackle. Abdominal:     General: There is no distension.     Palpations: Abdomen is soft.     Tenderness: There is no abdominal tenderness. There is no guarding.  Musculoskeletal:     Cervical back: Neck supple.     Comments: 2+ pitting edema bilaterally.  Slightly greater left than right.  Patient has extremely long,  curled and thick toenails.  Significant dry scaling of the skin on the feet.  No apparent active wounds, ulcers or cellulitis.  Skin:     General: Skin is warm and dry.  Neurological:     General: No focal deficit present.     Mental Status: She is oriented to person, place, and time.     Coordination: Coordination normal.  Psychiatric:        Mood and Affect: Mood normal.     ED Results / Procedures / Treatments   Labs (all labs ordered are listed, but only abnormal results are displayed) Labs Reviewed  RESPIRATORY PANEL BY RT PCR (FLU A&B, COVID) - Abnormal; Notable for the following components:      Result Value   SARS Coronavirus 2 by RT PCR POSITIVE (*)    All other components within normal limits  COMPREHENSIVE METABOLIC PANEL - Abnormal; Notable for the following components:   Potassium 3.4 (*)    Glucose, Bld 103 (*)    BUN 47 (*)    Creatinine, Ser 2.35 (*)    Albumin 3.2 (*)    GFR calc non Af Amer 20 (*)    GFR calc Af Amer 23 (*)    All other components within normal limits  CBC WITH DIFFERENTIAL/PLATELET - Abnormal; Notable for the following components:   RBC 3.11 (*)    Hemoglobin 9.4 (*)    HCT 29.6 (*)    RDW 16.2 (*)    All other components within normal limits  POCT I-STAT EG7 - Abnormal; Notable for the following components:   pH, Ven 7.522 (*)    pCO2, Ven 34.1 (*)    pO2, Ven 152.0 (*)    Acid-Base Excess 5.0 (*)    Potassium 3.4 (*)    HCT 28.0 (*)    Hemoglobin 9.5 (*)    All other components within normal limits  TROPONIN I (HIGH SENSITIVITY) - Abnormal; Notable for the following components:   Troponin I (High Sensitivity) 40 (*)    All other components within normal limits  LACTIC ACID, PLASMA  PROTIME-INR  LACTIC ACID, PLASMA  URINALYSIS, ROUTINE W REFLEX MICROSCOPIC  BLOOD GAS, VENOUS  BRAIN NATRIURETIC PEPTIDE  TROPONIN I (HIGH SENSITIVITY)    EKG EKG Interpretation  Date/Time:  Saturday June 05 2019 07:03:56 EST Ventricular Rate:  99 PR Interval:    QRS Duration: 189 QT Interval:  482 QTC Calculation: 619 R Axis:   -67 Text Interpretation: Sinus or ectopic  atrial rhythm Left bundle branch block no sig change from previous Confirmed by Charlesetta Shanks [phone]) on 06/05/2019 7:06:41 AM   Radiology DG Chest Port 1 View  Result Date: 06/05/2019 CLINICAL DATA:  Difficulty breathing for 1 week. EXAM: PORTABLE CHEST 1 VIEW COMPARISON:  07/15/2018 FINDINGS: 0733 hours. The cardio pericardial silhouette is enlarged. There is pulmonary vascular congestion without overt pulmonary edema. New right parahilar airspace disease noted with similar retrocardiac chronic atelectasis or scarring. The visualized bony structures of the thorax are intact. Telemetry leads overlie the chest. IMPRESSION: 1. New right parahilar airspace disease compatible with pneumonia. 2. Stable retrocardiac atelectasis or scarring. 3. Pulmonary vascular congestion without overt pulmonary edema. Electronically Signed   By: Misty Stanley M.D.   On: 06/05/2019 07:55    Procedures Procedures (including critical care time)  Medications Ordered in ED Medications  cefTRIAXone (ROCEPHIN) 1 g in sodium chloride 0.9 % 100 mL IVPB (1 g Intravenous New Bag/Given 06/05/19 0846)  azithromycin (ZITHROMAX) 500 mg  in sodium chloride 0.9 % 250 mL IVPB (500 mg Intravenous New Bag/Given 06/05/19 0848)  carvedilol (COREG) tablet 25 mg (has no administration in time range)  potassium chloride SA (KLOR-CON) CR tablet 40 mEq (has no administration in time range)  furosemide (LASIX) injection 60 mg (60 mg Intravenous Given 06/05/19 0833)  metoprolol tartrate (LOPRESSOR) injection 2.5 mg (2.5 mg Intravenous Given 06/05/19 0834)    ED Course  I have reviewed the triage vital signs and the nursing notes.  Pertinent labs & imaging results that were available during my care of the patient were reviewed by me and considered in my medical decision making (see chart for details).    MDM Rules/Calculators/A&P                     Patient presents with worsening shortness of breath.  Symptoms have progressed since  earlier in the month with cough and chest congestion.  Chest x-ray suggests pneumonia.  Patient was unable to tolerate outpatient antibiotics.  She was started on doxycycline but was not able to complete the course.  Notes indicate she was supposed to have gotten outpatient Covid testing but that did not happen.  She does test positive today.  She also has significant comorbid illness of congestive heart failure.  Patient has been increasing her Lasix dose at home with some relief of shortness of breath but chest x-ray shows vascular congestion, she has peripheral edema and crackles on exam.  Patient also has chronic renal disease complicating increasing doses of Lasix.  At this time, will plan on admission for community-acquired pneumonia with comorbid illnesses. Final Clinical Impression(s) / ED Diagnoses Final diagnoses:  Community acquired pneumonia of right upper lobe of lung  Congestive heart failure, unspecified HF chronicity, unspecified heart failure type ([place])  Shortness of breath  COVID-19    Rx / DC Orders ED Discharge Orders    None       Charlesetta Shanks, MD 06/05/19 [place], [place], MD 06/05/19 [phone]

## 2019-06-06 ENCOUNTER — Inpatient Hospital Stay (HOSPITAL_COMMUNITY): Payer: Medicare Other

## 2019-06-06 DIAGNOSIS — R7989 Other specified abnormal findings of blood chemistry: Secondary | ICD-10-CM

## 2019-06-06 DIAGNOSIS — U071 COVID-19: Secondary | ICD-10-CM

## 2019-06-06 DIAGNOSIS — I5032 Chronic diastolic (congestive) heart failure: Secondary | ICD-10-CM

## 2019-06-06 LAB — C-REACTIVE PROTEIN: CRP: 0.6 mg/dL (ref <1.0)

## 2019-06-06 LAB — CBC WITH DIFFERENTIAL/PLATELET
Abs Immature Granulocytes: 0.03 10*3/uL (ref 0.00–0.07)
Basophils Absolute: 0 10*3/uL (ref 0.0–0.1)
Basophils Relative: 0 %
Eosinophils Absolute: 0 10*3/uL (ref 0.0–0.5)
Eosinophils Relative: 0 %
HCT: 29.2 % — ABNORMAL LOW (ref 36.0–46.0)
Hemoglobin: 9.2 g/dL — ABNORMAL LOW (ref 12.0–15.0)
Immature Granulocytes: 1 %
Lymphocytes Relative: 24 %
Lymphs Abs: 1.2 10*3/uL (ref 0.7–4.0)
MCH: 29.9 pg (ref 26.0–34.0)
MCHC: 31.5 g/dL (ref 30.0–36.0)
MCV: 94.8 fL (ref 80.0–100.0)
Monocytes Absolute: 0.2 10*3/uL (ref 0.1–1.0)
Monocytes Relative: 5 %
Neutro Abs: 3.7 10*3/uL (ref 1.7–7.7)
Neutrophils Relative %: 70 %
Platelets: 145 10*3/uL — ABNORMAL LOW (ref 150–400)
RBC: 3.08 MIL/uL — ABNORMAL LOW (ref 3.87–5.11)
RDW: 16.4 % — ABNORMAL HIGH (ref 11.5–15.5)
WBC: 5.2 10*3/uL (ref 4.0–10.5)
nRBC: 0.4 % — ABNORMAL HIGH (ref 0.0–0.2)

## 2019-06-06 LAB — COMPREHENSIVE METABOLIC PANEL
ALT: 19 U/L (ref 0–44)
AST: 29 U/L (ref 15–41)
Albumin: 3.1 g/dL — ABNORMAL LOW (ref 3.5–5.0)
Alkaline Phosphatase: 74 U/L (ref 38–126)
Anion gap: 13 (ref 5–15)
BUN: 48 mg/dL — ABNORMAL HIGH (ref 8–23)
CO2: 24 mmol/L (ref 22–32)
Calcium: 9.4 mg/dL (ref 8.9–10.3)
Chloride: 106 mmol/L (ref 98–111)
Creatinine, Ser: 2.37 mg/dL — ABNORMAL HIGH (ref 0.44–1.00)
GFR calc Af Amer: 23 mL/min — ABNORMAL LOW (ref >60)
GFR calc non Af Amer: 20 mL/min — ABNORMAL LOW (ref >60)
Glucose, Bld: 126 mg/dL — ABNORMAL HIGH (ref 70–99)
Potassium: 3.9 mmol/L (ref 3.5–5.1)
Sodium: 143 mmol/L (ref 135–145)
Total Bilirubin: 0.7 mg/dL (ref 0.3–1.2)
Total Protein: 6.9 g/dL (ref 6.5–8.1)

## 2019-06-06 LAB — D-DIMER, QUANTITATIVE: D-Dimer, Quant: 9.51 ug/mL-FEU — ABNORMAL HIGH (ref 0.00–0.50)

## 2019-06-06 LAB — PHOSPHORUS: Phosphorus: 3.5 mg/dL (ref 2.5–4.6)

## 2019-06-06 LAB — MAGNESIUM: Magnesium: 1.6 mg/dL — ABNORMAL LOW (ref 1.7–2.4)

## 2019-06-06 LAB — FERRITIN: Ferritin: 1177 ng/mL — ABNORMAL HIGH (ref 11–307)

## 2019-06-06 MED ORDER — MAGNESIUM SULFATE 2 GM/50ML IV SOLN
2.0000 g | Freq: Once | INTRAVENOUS | Status: AC
  Administered 2019-06-06: 2 g via INTRAVENOUS
  Filled 2019-06-06: qty 50

## 2019-06-06 NOTE — Progress Notes (Signed)
VASCULAR LAB PRELIMINARY  PRELIMINARY  PRELIMINARY  PRELIMINARY  Bilateral lower extremity venous duplex completed.    Preliminary report:  See CV proc for preliminary results.   Kipton Skillen, RVT 06/06/2019, 11:44 AM

## 2019-06-06 NOTE — Progress Notes (Signed)
PROGRESS NOTE                                                                                                                                                                                                             Patient Demographics:    [name], is a 74 y.o. female, DOB - 1945/11/29, ZOX:[id number]  Admit date - 06/05/2019   Admitting Physician Karmen Bongo, MD  Outpatient Primary MD for the patient is Hoyt Koch, MD  LOS - 1   Chief Complaint  Patient presents with  . Shortness of Breath       Brief Narrative   74 y.o. female with medical history significant of pulmonary HTN; HTN; stage 3 CKD; s/p bioprosthetic AVR with AAA repair (05/2017); and chronic diastolic CHF presenting with SOB.   Patient with cough, chest congestion, she tested positive for COVID-19, with new oxygen requirement, admitted for further work-up.   Subjective:    [name] today reports some dyspnea, cough and congestion.   Assessment  & Plan :    Principal Problem:   Acute respiratory disease due to COVID-19 virus Active Problems:   Essential hypertension   Chronic diastolic CHF (congestive heart failure) (HCC)   History of aortic valve replacement with bioprosthetic valve   CKD (chronic kidney disease), stage IV (HCC)   Acute hypoxic respiratory failure due to COVID-19 infection -Requirement, currently on 2 L nasal cannula, imaging significant for opacity, continue with IV remdesivir, and steroids, she was encouraged to use incentive spirometry, flutter valve, and to get out of bed to chair once able. -We need to trend inflammatory markers, please see discussion below regarding elevated D-dimers.  COVID-19 Labs  Recent Labs    06/05/19 1040 06/06/19 0417  DDIMER 9.99* 9.51*  FERRITIN 1,190* 1,177*  LDH 320*  --   CRP 0.6 0.6    Lab Results  Component Value Date   SARSCOV2NAA POSITIVE (A) 06/05/2019   SARSCOV2NAA NOT  DETECTED 07/11/2018   Evaded D-dimers -Venous Doppler negative for DVT, VQ scan with no evidence of PE, continue with intermediate dose of DVT prophylaxis per COVID-19 protocol at 7500 units subcu heparin 3 times daily.  Chronic diastolic CHF -Patient with prior 05/2017 echo with preserved EF and grade 1 diastolic dysfunction with acute aortic dissection which was repaired on 05/30/17 -Elevated BNP, with  lower extremity edema with evidence of volume overload, continue with IV diuresis at 40 mg IV twice daily, monitor volume status closely as well monitor renal function closely given CKD stage IV. -Will repeat Echo now -Dr. Percival Spanish has been notified of admission and briefed on plans but has not been asked to consult at this time; he is happy to assist if needed -Will continue spironolactone  Stage IV CKD -As noted above, tenuous situation between need for diuresis due to CHF and advanced CKD -Will follow with daily BMP -Continue Calcitriol -Nephrology prn order set was used  HTN -Continue Norvasc, Coreg  S/p AVR -Continue ASA   Code Status : Full  Family Communication  : D/W patient  Disposition Plan  : Home  Barriers For Discharge : Hypoxia, on IV remdesivir and IV steroids  Consults  :  None  Procedures  : None  DVT Prophylaxis  :  [place] heparin  Lab Results  Component Value Date   PLT 145 (L) 06/06/2019    Antibiotics  :   Anti-infectives (From admission, onward)   Start     Dose/Rate Route Frequency Ordered Stop   06/06/19 1000  remdesivir 100 mg in sodium chloride 0.9 % 100 mL IVPB  Status:  Discontinued     100 mg 200 mL/hr over 30 Minutes Intravenous Daily 06/05/19 0947 06/05/19 1006   06/06/19 1000  remdesivir 100 mg in sodium chloride 0.9 % 100 mL IVPB  Status:  Discontinued     100 mg 200 mL/hr over 30 Minutes Intravenous Daily 06/05/19 1007 06/05/19 1610   06/06/19 1000  remdesivir 100 mg in sodium chloride 0.9 % 100 mL IVPB     100 mg 200 mL/hr over  30 Minutes Intravenous Daily 06/05/19 1610 06/10/19 0959   06/05/19 1015  remdesivir 100 mg in sodium chloride 0.9 % 100 mL IVPB     100 mg 200 mL/hr over 30 Minutes Intravenous Every 30 min 06/05/19 1007 06/05/19 1420   06/05/19 0945  remdesivir 200 mg in sodium chloride 0.9% 250 mL IVPB  Status:  Discontinued     200 mg 580 mL/hr over 30 Minutes Intravenous Once 06/05/19 0947 06/05/19 1006   06/05/19 0830  cefTRIAXone (ROCEPHIN) 1 g in sodium chloride 0.9 % 100 mL IVPB     1 g 200 mL/hr over 30 Minutes Intravenous  Once 06/05/19 0803 06/05/19 0921   06/05/19 0830  azithromycin (ZITHROMAX) 500 mg in sodium chloride 0.9 % 250 mL IVPB     500 mg 250 mL/hr over 60 Minutes Intravenous  Once 06/05/19 0803 06/05/19 0949        Objective:   Vitals:   06/06/19 1200 06/06/19 1300 06/06/19 1400 06/06/19 1415  BP: 132/74   (!) 141/75  Pulse: 72 77 80 86  Resp: 14 18 20 15   Temp:    98.6 F (37 C)  TempSrc:    Oral  SpO2: 94% 96% 94% 95%  Weight:      Height:        Wt Readings from Last 3 Encounters:  06/05/19 78 kg  04/06/19 78 kg  02/25/19 78 kg     Intake/Output Summary (Last 24 hours) at 06/06/2019 1529 Last data filed at 06/06/2019 1415 Gross per 24 hour  Intake 690 ml  Output 850 ml  Net -160 ml     Physical Exam  Awake Alert, Oriented X 3, No new F.N deficits, Normal affect Symmetrical Chest wall movement, Good air movement bilaterally, CTAB  RRR,No Gallops,Rubs or new Murmurs, No Parasternal Heave +ve B.Sounds, Abd Soft, No tenderness, No rebound - guarding or rigidity. No Cyanosis, Clubbing +1  edema    Data Review:    CBC Recent Labs  Lab 06/05/19 0725 06/05/19 0745 06/06/19 0417  WBC 8.0  --  5.2  HGB 9.4* 9.5* 9.2*  HCT 29.6* 28.0* 29.2*  PLT 154  --  145*  MCV 95.2  --  94.8  MCH 30.2  --  29.9  MCHC 31.8  --  31.5  RDW 16.2*  --  16.4*  LYMPHSABS 1.6  --  1.2  MONOABS 0.8  --  0.2  EOSABS 0.3  --  0.0  BASOSABS 0.0  --  0.0     Chemistries  Recent Labs  Lab 06/05/19 0725 06/05/19 0745 06/06/19 0417  NA 144 145 143  K 3.4* 3.4* 3.9  CL 106  --  106  CO2 26  --  24  GLUCOSE 103*  --  126*  BUN 47*  --  48*  CREATININE 2.35*  --  2.37*  CALCIUM 9.4  --  9.4  MG  --   --  1.6*  AST 27  --  29  ALT 17  --  19  ALKPHOS 85  --  74  BILITOT 0.8  --  0.7   ------------------------------------------------------------------------------------------------------------------ No results for input(s): CHOL, HDL, LDLCALC, TRIG, CHOLHDL, LDLDIRECT in the last 72 hours.  Lab Results  Component Value Date   HGBA1C 5.1 02/25/2019   ------------------------------------------------------------------------------------------------------------------ No results for input(s): TSH, T4TOTAL, T3FREE, THYROIDAB in the last 72 hours.  Invalid input(s): FREET3 ------------------------------------------------------------------------------------------------------------------ Recent Labs    06/05/19 1040 06/06/19 0417  FERRITIN 1,190* 1,177*    Coagulation profile Recent Labs  Lab 06/05/19 0725  INR 1.0    Recent Labs    06/05/19 1040 06/06/19 0417  DDIMER 9.99* 9.51*    Cardiac Enzymes No results for input(s): CKMB, TROPONINI, MYOGLOBIN in the last 168 hours.  Invalid input(s): CK ------------------------------------------------------------------------------------------------------------------    Component Value Date/Time   BNP 994.8 (H) 06/05/2019 0725    Inpatient Medications  Scheduled Meds: . amLODipine  5 mg Oral QHS  . aspirin EC  81 mg Oral Daily  . [START ON 06/07/2019] calcitRIOL  0.25 mcg Oral Once per day on Mon Fri  . carvedilol  25 mg Oral BID WC  . dexamethasone (DECADRON) injection  6 mg Intravenous Q24H  . furosemide  40 mg Intravenous BID  . heparin injection (subcutaneous)  7,500 Units Subcutaneous Q8H  . sodium chloride flush  3 mL Intravenous Q12H  . sodium chloride flush  3 mL  Intravenous Q12H   Continuous Infusions: . sodium chloride    . remdesivir 100 mg in NS 100 mL 100 mg (06/06/19 1030)   PRN Meds:.sodium chloride, acetaminophen, albuterol, bisacodyl, calcium carbonate (dosed in mg elemental calcium), camphor-menthol **AND** hydrOXYzine, chlorpheniramine-HYDROcodone, docusate sodium, feeding supplement (NEPRO CARB STEADY), guaiFENesin-dextromethorphan, ondansetron **OR** ondansetron (ZOFRAN) IV, oxyCODONE, polyethylene glycol, polyvinyl alcohol, sodium chloride flush, sodium phosphate, sorbitol, zolpidem  Micro Results Recent Results (from the past 240 hour(s))  Respiratory Panel by RT PCR (Flu A&B, Covid) - Nasopharyngeal Swab     Status: Abnormal   Collection Time: 06/05/19  7:25 AM   Specimen: Nasopharyngeal Swab  Result Value Ref Range Status   SARS Coronavirus 2 by RT PCR POSITIVE (A) NEGATIVE Final    Comment: RESULT CALLED TO, READ BACK BY AND VERIFIED WITH: N KOONTZ ON [id number] AT  8891 BY NFIELDS (NOTE) SARS-CoV-2 target nucleic acids are DETECTED. SARS-CoV-2 RNA is generally detectable in upper respiratory specimens  during the acute phase of infection. Positive results are indicative of the presence of the identified virus, but do not rule out bacterial infection or co-infection with other pathogens not detected by the test. Clinical correlation with patient history and other diagnostic information is necessary to determine patient infection status. The expected result is Negative. Fact Sheet for Patients:  PinkCheek.be Fact Sheet for Healthcare Providers: GravelBags.it This test is not yet approved or cleared by the [place] FDA and  has been authorized for detection and/or diagnosis of SARS-CoV-2 by FDA under an Emergency Use Authorization (EUA).  This EUA will remain in effect (meaning this test can be used)  for the duration of  the COVID-19 declaration under Section 564(b)(1)  of the Act, 21 U.S.C. section 360bbb-3(b)(1), unless the authorization is terminated or revoked sooner.    Influenza A by PCR NEGATIVE NEGATIVE Final   Influenza B by PCR NEGATIVE NEGATIVE Final    Comment: (NOTE) The Xpert Xpress SARS-CoV-2/FLU/RSV assay is intended as an aid in  the diagnosis of influenza from Nasopharyngeal swab specimens and  should not be used as a sole basis for treatment. Nasal washings and  aspirates are unacceptable for Xpert Xpress SARS-CoV-2/FLU/RSV  testing. Fact Sheet for Patients: PinkCheek.be Fact Sheet for Healthcare Providers: GravelBags.it This test is not yet approved or cleared by the [place] FDA and  has been authorized for detection and/or diagnosis of SARS-CoV-2 by  FDA under an Emergency Use Authorization (EUA). This EUA will remain  in effect (meaning this test can be used) for the duration of the  Covid-19 declaration under Section 564(b)(1) of the Act, 21  U.S.C. section 360bbb-3(b)(1), unless the authorization is  terminated or revoked. Performed at [facility], [place] [address] [place], [place] [address]     Radiology Reports NM Pulmonary Perfusion  Result Date: 06/05/2019 CLINICAL DATA:  Hypoxemia. Pulmonary hypertension. Stra[place] 3 kidney disease. EXAM: NUCLEAR MEDICINE PERFUSION LUNG SCAN TECHNIQUE: Perfusion images were obtained in multiple projections after intravenous injection of radiopharmaceutical. Ventilation scans intentionally deferred if perfusion scan and chest x-ray adequate for interpretation during COVID 19 epidemic. RADIOPHARMACEUTICALS:  1.5 mCi Tc-95m MAA IV COMPARISON:  Chest radiograph 06/05/2019 FINDINGS: Posterolateral left lower lung decreased perfusion, at the site of airspace disease on 06/05/2019 plain film. Otherwise, no wedge-shaped defects to suggest acute pulmonary embolism. IMPRESSION: No findings to suggest pulmonary embolism.  Electronically Signed   By: Abigail Miyamoto M.D.   On: 06/05/2019 16:33   DG Chest Port 1 View  Result Date: 06/05/2019 CLINICAL DATA:  Difficulty breathing for 1 week. EXAM: PORTABLE CHEST 1 VIEW COMPARISON:  07/15/2018 FINDINGS: 0733 hours. The cardio pericardial silhouette is enlarged. There is pulmonary vascular congestion without overt pulmonary edema. New right parahilar airspace disease noted with similar retrocardiac chronic atelectasis or scarring. The visualized bony structures of the thorax are intact. Telemetry leads overlie the chest. IMPRESSION: 1. New right parahilar airspace disease compatible with pneumonia. 2. Stable retrocardiac atelectasis or scarring. 3. Pulmonary vascular congestion without overt pulmonary edema. Electronically Signed   By: Misty Stanley M.D.   On: 06/05/2019 07:55   VAS [place] LOWER EXTREMITY VENOUS (DVT)  Result Date: 06/06/2019  Lower Venous DVTStudy Indications: Covid-19, elevated D-Dimer.  Limitations: Poor ultrasound/tissue interface and edema. Comparison Study: Prior study from 07/15/18 is available for comparison Performing Technologist: Sharion Dove RVS  Examination Guidelines:  A complete evaluation includes B-mode imaging, spectral Doppler, color Doppler, and power Doppler as needed of all accessible portions of each vessel. Bilateral testing is considered an integral part of a complete examination. Limited examinations for reoccurring indications may be performed as noted. The reflux portion of the exam is performed with the patient in reverse Trendelenburg.  +---------+---------------+---------+-----------+----------+--------------+ RIGHT    CompressibilityPhasicitySpontaneityPropertiesThrombus Aging +---------+---------------+---------+-----------+----------+--------------+ CFV      Full           Yes      Yes                                 +---------+---------------+---------+-----------+----------+--------------+ SFJ      Full                                                         +---------+---------------+---------+-----------+----------+--------------+ FV Prox  Full                                                        +---------+---------------+---------+-----------+----------+--------------+ FV Mid   Full                                                        +---------+---------------+---------+-----------+----------+--------------+ FV DistalFull                                                        +---------+---------------+---------+-----------+----------+--------------+ PFV      Full                                                        +---------+---------------+---------+-----------+----------+--------------+ POP      Full           Yes      Yes                                 +---------+---------------+---------+-----------+----------+--------------+ PTV      Full                                                        +---------+---------------+---------+-----------+----------+--------------+ PERO     Full                                                        +---------+---------------+---------+-----------+----------+--------------+   +---------+---------------+---------+-----------+----------+--------------+  LEFT     CompressibilityPhasicitySpontaneityPropertiesThrombus Aging +---------+---------------+---------+-----------+----------+--------------+ CFV      Full           Yes      Yes                                 +---------+---------------+---------+-----------+----------+--------------+ SFJ      Full                                                        +---------+---------------+---------+-----------+----------+--------------+ FV Prox  Full                                                        +---------+---------------+---------+-----------+----------+--------------+ FV Mid   Full                                                         +---------+---------------+---------+-----------+----------+--------------+ FV DistalFull                                                        +---------+---------------+---------+-----------+----------+--------------+ PFV      Full                                                        +---------+---------------+---------+-----------+----------+--------------+ POP      Full           Yes      Yes                                 +---------+---------------+---------+-----------+----------+--------------+ PTV      Full                                                        +---------+---------------+---------+-----------+----------+--------------+ PERO     Full                                                        +---------+---------------+---------+-----------+----------+--------------+     Summary: BILATERAL: - No evidence of deep vein thrombosis seen in the lower extremities, bilaterally.  RIGHT: - Findings appear improved from previous examination.  LEFT: - Findings appear essentially unchanged compared to previous examination.  *See table(s) above for measurements and observations.  Electronically signed by Servando Snare MD on 06/06/2019 at 12:45:27 PM.    Final    ECHOCARDIOGRAM LIMITED  Result Date: 06/05/2019    ECHOCARDIOGRAM LIMITED REPORT   Patient Name:   MAKAYLIN CARLO Date of Exam: 06/05/2019 Medical Rec #:  [id number]     Height:       65.0 in Accession #:    [id number]    Weight:       172.0 lb Date of Birth:  December 06, 1945      BSA:          1.855 m Patient Age:    89 years      BP:           155/84 mmHg Patient Gender: F             HR:           94 bpm. Exam Location:  Inpatient Procedure: Limited Echo, Cardiac Doppler and Limited Color Doppler Indications:    dyspnea 786.09  History:        Patient has prior history of Echocardiogram examinations, most                 recent 05/30/2017. CHF, Covid. chronic kidney disease; Risk                 Factors:Hypertension.                  Aortic Valve: bioprosthetic valve is present in the aortic                 position.  Sonographer:    Johny Chess Referring Phys: [place]  1. Left ventricular ejection fraction, by estimation, is 50 to 55%. The left ventricle has low normal function. The left ventricle has no regional wall motion abnormalities. There is severe concentric left ventricular hypertrophy. There is hypokinesis of the left ventricular, inferior wall.  2. Right ventricular systolic function is normal. The right ventricular size is normal. Tricuspid regurgitation signal is inadequate for assessing PA pressure.  3. The mitral valve is normal in structure and function. Mild to moderate mitral valve regurgitation. No evidence of mitral stenosis.  4. The aortic valve has been repaired/replaced. There is a bioprosthetic valve present in the aortic position. Aortic valve mean gradient measures 15.5 mmHg. Aortic valve peak gradient measures 30.4 mmHg. Aortic valve area, by VTI measures 1.09 cm. There is mild perivalvular AI.  5. S/P aortic root repair. Aortic dilatation noted. There is mild dilatation of the aortic root measuring 41 mm.  6. The inferior vena cava is dilated in size with >50% respiratory variability, suggesting right atrial pressure of 8 mmHg. FINDINGS  Left Ventricle: Left ventricular ejection fraction, by estimation, is 50--55%. The left ventricle has low normal function. The left ventricle has no regional wall motion abnormalities. The left ventricular internal cavity size was normal in size. There is severe concentric left ventricular hypertrophy. Abnormal (paradoxical) septal motion, consistent with left bundle branch block. Right Ventricle: The right ventricular size is normal. No increase in right ventricular wall thickness. Right ventricular systolic function is normal. Tricuspid regurgitation signal is inadequate for assessing PA pressure. Left Atrium: Left atrial size was normal  in size. Right Atrium: Right atrial size was normal in size. Pericardium: There is no evidence of pericardial effusion. Mitral Valve: The mitral valve is normal in structure and function. Normal mobility of the mitral valve leaflets. Mild to moderate mitral valve regurgitation. No evidence of  mitral valve stenosis. Tricuspid Valve: The tricuspid valve is normal in structure. Tricuspid valve regurgitation is mild . No evidence of tricuspid stenosis. Aortic Valve: There is mild perivalvular AI. The aortic valve has been repaired/replaced. Aortic valve mean gradient measures 15.5 mmHg. Aortic valve peak gradient measures 30.4 mmHg. Aortic valve area, by VTI measures 1.09 cm. There is a bioprosthetic valve present in the aortic position. Pulmonic Valve: The pulmonic valve was normal in structure. Pulmonic valve regurgitation is mild. No evidence of pulmonic stenosis. Aorta: Aortic dilatation noted. There is mild dilatation of the aortic root measuring 41 mm. Venous: The inferior vena cava is dilated in size with greater than 50% respiratory variability, suggesting right atrial pressure of 8 mmHg. IAS/Shunts: No atrial level shunt detected by color flow Doppler.  LEFT VENTRICLE PLAX 2D LVIDd:         4.40 cm LVIDs:         3.20 cm LV PW:         2.40 cm LV IVS:        2.00 cm LVOT diam:     1.80 cm LV SV:         50 LV SV Index:   27 LVOT Area:     2.54 cm  LEFT ATRIUM         Index LA diam:    3.50 cm 1.89 cm/m  AORTIC VALVE AV Area (Vmax):    1.09 cm AV Area (Vmean):   0.97 cm AV Area (VTI):     1.09 cm AV Vmax:           275.50 cm/s AV Vmean:          181.000 cm/s AV VTI:            0.460 m AV Peak Grad:      30.4 mmHg AV Mean Grad:      15.5 mmHg LVOT Vmax:         118.00 cm/s LVOT Vmean:        68.800 cm/s LVOT VTI:          0.198 m LVOT/AV VTI ratio: 0.43  AORTA Ao Root diam: 4.10 cm  SHUNTS Systemic VTI:  0.20 m Systemic Diam: 1.80 cm Fransico Him MD Electronically signed by Fransico Him MD Signature  Date/Time: 06/05/2019/6:50:15 PM    Final       Phillips Climes M.D on 06/06/2019 at 3:29 PM  Between 7am to 7pm - Pager - [phone]  After 7pm go to [email or web address] - password [facility]  [facility] -  Office  [phone]

## 2019-06-07 DIAGNOSIS — N184 Chronic kidney disease, stage 4 (severe): Secondary | ICD-10-CM

## 2019-06-07 LAB — CBC WITH DIFFERENTIAL/PLATELET
Abs Immature Granulocytes: 0.07 10*3/uL (ref 0.00–0.07)
Basophils Absolute: 0 10*3/uL (ref 0.0–0.1)
Basophils Relative: 0 %
Eosinophils Absolute: 0 10*3/uL (ref 0.0–0.5)
Eosinophils Relative: 0 %
HCT: 28 % — ABNORMAL LOW (ref 36.0–46.0)
Hemoglobin: 9 g/dL — ABNORMAL LOW (ref 12.0–15.0)
Immature Granulocytes: 1 %
Lymphocytes Relative: 14 %
Lymphs Abs: 1.1 10*3/uL (ref 0.7–4.0)
MCH: 30.7 pg (ref 26.0–34.0)
MCHC: 32.1 g/dL (ref 30.0–36.0)
MCV: 95.6 fL (ref 80.0–100.0)
Monocytes Absolute: 0.6 10*3/uL (ref 0.1–1.0)
Monocytes Relative: 8 %
Neutro Abs: 5.8 10*3/uL (ref 1.7–7.7)
Neutrophils Relative %: 77 %
Platelets: 144 10*3/uL — ABNORMAL LOW (ref 150–400)
RBC: 2.93 MIL/uL — ABNORMAL LOW (ref 3.87–5.11)
RDW: 16.7 % — ABNORMAL HIGH (ref 11.5–15.5)
WBC: 7.5 10*3/uL (ref 4.0–10.5)
nRBC: 0 % (ref 0.0–0.2)

## 2019-06-07 LAB — COMPREHENSIVE METABOLIC PANEL
ALT: 18 U/L (ref 0–44)
AST: 26 U/L (ref 15–41)
Albumin: 3 g/dL — ABNORMAL LOW (ref 3.5–5.0)
Alkaline Phosphatase: 68 U/L (ref 38–126)
Anion gap: 10 (ref 5–15)
BUN: 66 mg/dL — ABNORMAL HIGH (ref 8–23)
CO2: 27 mmol/L (ref 22–32)
Calcium: 9.1 mg/dL (ref 8.9–10.3)
Chloride: 103 mmol/L (ref 98–111)
Creatinine, Ser: 2.76 mg/dL — ABNORMAL HIGH (ref 0.44–1.00)
GFR calc Af Amer: 19 mL/min — ABNORMAL LOW (ref >60)
GFR calc non Af Amer: 16 mL/min — ABNORMAL LOW (ref >60)
Glucose, Bld: 121 mg/dL — ABNORMAL HIGH (ref 70–99)
Potassium: 3.8 mmol/L (ref 3.5–5.1)
Sodium: 140 mmol/L (ref 135–145)
Total Bilirubin: 0.8 mg/dL (ref 0.3–1.2)
Total Protein: 6.5 g/dL (ref 6.5–8.1)

## 2019-06-07 LAB — C-REACTIVE PROTEIN: CRP: 0.6 mg/dL (ref <1.0)

## 2019-06-07 LAB — PHOSPHORUS: Phosphorus: 3.3 mg/dL (ref 2.5–4.6)

## 2019-06-07 LAB — D-DIMER, QUANTITATIVE: D-Dimer, Quant: 6.94 ug/mL-FEU — ABNORMAL HIGH (ref 0.00–0.50)

## 2019-06-07 LAB — MAGNESIUM: Magnesium: 2.2 mg/dL (ref 1.7–2.4)

## 2019-06-07 LAB — FERRITIN: Ferritin: 932 ng/mL — ABNORMAL HIGH (ref 11–307)

## 2019-06-07 MED ORDER — HEPARIN SODIUM (PORCINE) 5000 UNIT/ML IJ SOLN
5000.0000 [IU] | Freq: Three times a day (TID) | INTRAMUSCULAR | Status: DC
  Administered 2019-06-07 – 2019-06-09 (×6): 5000 [IU] via SUBCUTANEOUS
  Filled 2019-06-07 (×6): qty 1

## 2019-06-07 MED ORDER — FUROSEMIDE 10 MG/ML IJ SOLN
40.0000 mg | Freq: Every day | INTRAMUSCULAR | Status: DC
  Administered 2019-06-08: 40 mg via INTRAVENOUS
  Filled 2019-06-07: qty 4

## 2019-06-07 MED ORDER — ADULT MULTIVITAMIN W/MINERALS CH
1.0000 | ORAL_TABLET | Freq: Every day | ORAL | Status: DC
  Administered 2019-06-07 – 2019-06-09 (×3): 1 via ORAL
  Filled 2019-06-07 (×3): qty 1

## 2019-06-07 NOTE — Progress Notes (Signed)
PROGRESS NOTE                                                                                                                                                                                                             Patient Demographics:    [name], is a 74 y.o. female, DOB - Aug 31, 1945, HAL:[id number]  Admit date - 06/05/2019   Admitting Physician Karmen Bongo, MD  Outpatient Primary MD for the patient is Hoyt Koch, MD  LOS - 2   Chief Complaint  Patient presents with  . Shortness of Breath       Brief Narrative   74 y.o. female with medical history significant of pulmonary HTN; HTN; stage 3 CKD; s/p bioprosthetic AVR with AAA repair (05/2017); and chronic diastolic CHF presenting with SOB.   Patient with cough, chest congestion, she tested positive for COVID-19, with new oxygen requirement, admitted for further work-up.   Subjective:    [name] today reports dyspnea has improved, still reports cough and congestion  Assessment  & Plan :    Principal Problem:   Acute respiratory disease due to COVID-19 virus Active Problems:   Essential hypertension   Chronic diastolic CHF (congestive heart failure) (HCC)   History of aortic valve replacement with bioprosthetic valve   CKD (chronic kidney disease), stage IV (HCC)   Acute hypoxic respiratory failure due to COVID-19 infection -Patient requiring 2 L nasal cannula on admission, this appears to be improving, currently tolerating room air at rest, discussed with staff to ambulate and see her oxygen requirement with activity,imaging significant for opacity. -Continue with IV remdesivir. - continue with steroids. - she was encouraged to use incentive spirometry, flutter valve, and to get out of bed to chair . -We need to trend inflammatory markers, please see discussion below regarding elevated D-dimers.  COVID-19 Labs  Recent Labs    06/05/19 1040 06/06/19 0417  06/07/19 0310  DDIMER 9.99* 9.51* 6.94*  FERRITIN 1,190* 1,177* 932*  LDH 320*  --   --   CRP 0.6 0.6 0.6    Lab Results  Component Value Date   SARSCOV2NAA POSITIVE (A) 06/05/2019   SARSCOV2NAA NOT DETECTED 07/11/2018   Elevated D-dimers -Venous Doppler negative for DVT, VQ scan with no evidence of PE, continue with intermediate dose of DVT prophylaxis per COVID-19 protocol at 7500  units subcu heparin 3 times daily.  Acute on chronic diastolic CHF -Patient with prior 05/2017 echo with preserved EF and grade 1 diastolic dysfunction with acute aortic dissection which was repaired on 05/30/17 -Repeat 2D echo with EF 50 to 55%, concentric left ventricular hypertrophy(old), and hypokinesis. -Elevated BNP, with lower extremity edema with evidence of volume overload. -  continue with IV diuresis at 40 mg IV twice daily, monitor volume status closely as well monitor renal function closely given CKD stage IV.  So far she has -1.4 L over last 24 hours. -Will continue spironolactone  Stage IV CKD -As noted above, tenuous situation between need for diuresis due to CHF and advanced CKD -Will follow with daily BMP -Continue Calcitriol -Nephrology prn order set was used  HTN -Continue Norvasc, Coreg  S/p AVR -Continue ASA   Code Status : Full  Family Communication  : Tried to call husband, unable to leave voicemail on 3/1  Disposition Plan  : Home  Barriers For Discharge :  on IV remdesivir and IV steroids and IV lasix  Consults  :  None  Procedures  : None  DVT Prophylaxis  :  [place] heparin  Lab Results  Component Value Date   PLT 144 (L) 06/07/2019    Antibiotics  :   Anti-infectives (From admission, onward)   Start     Dose/Rate Route Frequency Ordered Stop   06/06/19 1000  remdesivir 100 mg in sodium chloride 0.9 % 100 mL IVPB  Status:  Discontinued     100 mg 200 mL/hr over 30 Minutes Intravenous Daily 06/05/19 0947 06/05/19 1006   06/06/19 1000  remdesivir 100 mg  in sodium chloride 0.9 % 100 mL IVPB  Status:  Discontinued     100 mg 200 mL/hr over 30 Minutes Intravenous Daily 06/05/19 1007 06/05/19 1610   06/06/19 1000  remdesivir 100 mg in sodium chloride 0.9 % 100 mL IVPB     100 mg 200 mL/hr over 30 Minutes Intravenous Daily 06/05/19 1610 06/10/19 0959   06/05/19 1015  remdesivir 100 mg in sodium chloride 0.9 % 100 mL IVPB     100 mg 200 mL/hr over 30 Minutes Intravenous Every 30 min 06/05/19 1007 06/05/19 1420   06/05/19 0945  remdesivir 200 mg in sodium chloride 0.9% 250 mL IVPB  Status:  Discontinued     200 mg 580 mL/hr over 30 Minutes Intravenous Once 06/05/19 0947 06/05/19 1006   06/05/19 0830  cefTRIAXone (ROCEPHIN) 1 g in sodium chloride 0.9 % 100 mL IVPB     1 g 200 mL/hr over 30 Minutes Intravenous  Once 06/05/19 0803 06/05/19 0921   06/05/19 0830  azithromycin (ZITHROMAX) 500 mg in sodium chloride 0.9 % 250 mL IVPB     500 mg 250 mL/hr over 60 Minutes Intravenous  Once 06/05/19 0803 06/05/19 0949        Objective:   Vitals:   06/07/19 0519 06/07/19 0700 06/07/19 0701 06/07/19 0800  BP: (!) 160/98 (!) 158/93    Pulse: 71 69 73 73  Resp: 16 16 17 16   Temp: 98.5 F (36.9 C)     TempSrc: Oral     SpO2: 99% 93% 92% 96%  Weight:      Height:        Wt Readings from Last 3 Encounters:  06/05/19 78 kg  04/06/19 78 kg  02/25/19 78 kg     Intake/Output Summary (Last 24 hours) at 06/07/2019 1300 Last data filed at 06/07/2019  1914 Gross per 24 hour  Intake 340 ml  Output 1750 ml  Net -1410 ml     Physical Exam  Awake Alert, Oriented X 3, No new F.N deficits, Normal affect Symmetrical Chest wall movement, Good air movement bilaterally, no wheezing RRR,No Gallops,Rubs or new Murmurs, No Parasternal Heave +ve B.Sounds, Abd Soft, No tenderness, No rebound - guarding or rigidity. No Cyanosis, Clubbing ,lower extremity edema has improved,    Data Review:    CBC Recent Labs  Lab 06/05/19 0725 06/05/19 0745  06/06/19 0417 06/07/19 0310  WBC 8.0  --  5.2 7.5  HGB 9.4* 9.5* 9.2* 9.0*  HCT 29.6* 28.0* 29.2* 28.0*  PLT 154  --  145* 144*  MCV 95.2  --  94.8 95.6  MCH 30.2  --  29.9 30.7  MCHC 31.8  --  31.5 32.1  RDW 16.2*  --  16.4* 16.7*  LYMPHSABS 1.6  --  1.2 1.1  MONOABS 0.8  --  0.2 0.6  EOSABS 0.3  --  0.0 0.0  BASOSABS 0.0  --  0.0 0.0    Chemistries  Recent Labs  Lab 06/05/19 0725 06/05/19 0745 06/06/19 0417 06/07/19 0310  NA 144 145 143 140  K 3.4* 3.4* 3.9 3.8  CL 106  --  106 103  CO2 26  --  24 27  GLUCOSE 103*  --  126* 121*  BUN 47*  --  48* 66*  CREATININE 2.35*  --  2.37* 2.76*  CALCIUM 9.4  --  9.4 9.1  MG  --   --  1.6* 2.2  AST 27  --  29 26  ALT 17  --  19 18  ALKPHOS 85  --  74 68  BILITOT 0.8  --  0.7 0.8   ------------------------------------------------------------------------------------------------------------------ No results for input(s): CHOL, HDL, LDLCALC, TRIG, CHOLHDL, LDLDIRECT in the last 72 hours.  Lab Results  Component Value Date   HGBA1C 5.1 02/25/2019   ------------------------------------------------------------------------------------------------------------------ No results for input(s): TSH, T4TOTAL, T3FREE, THYROIDAB in the last 72 hours.  Invalid input(s): FREET3 ------------------------------------------------------------------------------------------------------------------ Recent Labs    06/06/19 0417 06/07/19 0310  FERRITIN 1,177* 932*    Coagulation profile Recent Labs  Lab 06/05/19 0725  INR 1.0    Recent Labs    06/06/19 0417 06/07/19 0310  DDIMER 9.51* 6.94*    Cardiac Enzymes No results for input(s): CKMB, TROPONINI, MYOGLOBIN in the last 168 hours.  Invalid input(s): CK ------------------------------------------------------------------------------------------------------------------    Component Value Date/Time   BNP 994.8 (H) 06/05/2019 0725    Inpatient Medications  Scheduled Meds: .  amLODipine  5 mg Oral QHS  . aspirin EC  81 mg Oral Daily  . calcitRIOL  0.25 mcg Oral Once per day on Mon Fri  . carvedilol  25 mg Oral BID WC  . dexamethasone (DECADRON) injection  6 mg Intravenous Q24H  . furosemide  40 mg Intravenous BID  . heparin injection (subcutaneous)  5,000 Units Subcutaneous Q8H  . sodium chloride flush  3 mL Intravenous Q12H  . sodium chloride flush  3 mL Intravenous Q12H   Continuous Infusions: . sodium chloride    . remdesivir 100 mg in NS 100 mL 100 mg (06/07/19 0926)   PRN Meds:.sodium chloride, acetaminophen, albuterol, bisacodyl, calcium carbonate (dosed in mg elemental calcium), camphor-menthol **AND** hydrOXYzine, chlorpheniramine-HYDROcodone, docusate sodium, feeding supplement (NEPRO CARB STEADY), guaiFENesin-dextromethorphan, ondansetron **OR** ondansetron (ZOFRAN) IV, oxyCODONE, polyethylene glycol, polyvinyl alcohol, sodium chloride flush, sodium phosphate, sorbitol, zolpidem  Micro Results Recent  Results (from the past 240 hour(s))  Respiratory Panel by RT PCR (Flu A&B, Covid) - Nasopharyngeal Swab     Status: Abnormal   Collection Time: 06/05/19  7:25 AM   Specimen: Nasopharyngeal Swab  Result Value Ref Range Status   SARS Coronavirus 2 by RT PCR POSITIVE (A) NEGATIVE Final    Comment: RESULT CALLED TO, READ BACK BY AND VERIFIED WITH: N KOONTZ ON [id number] AT 0907 BY NFIELDS (NOTE) SARS-CoV-2 target nucleic acids are DETECTED. SARS-CoV-2 RNA is generally detectable in upper respiratory specimens  during the acute phase of infection. Positive results are indicative of the presence of the identified virus, but do not rule out bacterial infection or co-infection with other pathogens not detected by the test. Clinical correlation with patient history and other diagnostic information is necessary to determine patient infection status. The expected result is Negative. Fact Sheet for Patients:  PinkCheek.be Fact Sheet  for Healthcare Providers: GravelBags.it This test is not yet approved or cleared by the [place] FDA and  has been authorized for detection and/or diagnosis of SARS-CoV-2 by FDA under an Emergency Use Authorization (EUA).  This EUA will remain in effect (meaning this test can be used)  for the duration of  the COVID-19 declaration under Section 564(b)(1) of the Act, 21 U.S.C. section 360bbb-3(b)(1), unless the authorization is terminated or revoked sooner.    Influenza A by PCR NEGATIVE NEGATIVE Final   Influenza B by PCR NEGATIVE NEGATIVE Final    Comment: (NOTE) The Xpert Xpress SARS-CoV-2/FLU/RSV assay is intended as an aid in  the diagnosis of influenza from Nasopharyngeal swab specimens and  should not be used as a sole basis for treatment. Nasal washings and  aspirates are unacceptable for Xpert Xpress SARS-CoV-2/FLU/RSV  testing. Fact Sheet for Patients: PinkCheek.be Fact Sheet for Healthcare Providers: GravelBags.it This test is not yet approved or cleared by the [place] FDA and  has been authorized for detection and/or diagnosis of SARS-CoV-2 by  FDA under an Emergency Use Authorization (EUA). This EUA will remain  in effect (meaning this test can be used) for the duration of the  Covid-19 declaration under Section 564(b)(1) of the Act, 21  U.S.C. section 360bbb-3(b)(1), unless the authorization is  terminated or revoked. Performed at [facility], [place] [address] [place], [place] [address]     Radiology Reports NM Pulmonary Perfusion  Result Date: 06/05/2019 CLINICAL DATA:  Hypoxemia. Pulmonary hypertension. [place] 3 kidney disease. EXAM: NUCLEAR MEDICINE PERFUSION LUNG SCAN TECHNIQUE: Perfusion images were obtained in multiple projections after intravenous injection of radiopharmaceutical. Ventilation scans intentionally deferred if perfusion scan and chest x-ray  adequate for interpretation during COVID 19 epidemic. RADIOPHARMACEUTICALS:  1.5 mCi Tc-7m MAA IV COMPARISON:  Chest radiograph 06/05/2019 FINDINGS: Posterolateral left lower lung decreased perfusion, at the site of airspace disease on 06/05/2019 plain film. Otherwise, no wedge-shaped defects to suggest acute pulmonary embolism. IMPRESSION: No findings to suggest pulmonary embolism. Electronically Signed   By: Abigail Miyamoto M.D.   On: 06/05/2019 16:33   DG Chest Port 1 View  Result Date: 06/05/2019 CLINICAL DATA:  Difficulty breathing for 1 week. EXAM: PORTABLE CHEST 1 VIEW COMPARISON:  07/15/2018 FINDINGS: 0733 hours. The cardio pericardial silhouette is enlarged. There is pulmonary vascular congestion without overt pulmonary edema. New right parahilar airspace disease noted with similar retrocardiac chronic atelectasis or scarring. The visualized bony structures of the thorax are intact. Telemetry leads overlie the chest. IMPRESSION: 1. New right parahilar airspace disease compatible with pneumonia.  2. Stable retrocardiac atelectasis or scarring. 3. Pulmonary vascular congestion without overt pulmonary edema. Electronically Signed   By: Misty Stanley M.D.   On: 06/05/2019 07:55   VAS [place] LOWER EXTREMITY VENOUS (DVT)  Result Date: 06/06/2019  Lower Venous DVTStudy Indications: Covid-19, elevated D-Dimer.  Limitations: Poor ultrasound/tissue interface and edema. Comparison Study: Prior study from 07/15/18 is available for comparison Performing Technologist: Sharion Dove RVS  Examination Guidelines: A complete evaluation includes B-mode imaging, spectral Doppler, color Doppler, and power Doppler as needed of all accessible portions of each vessel. Bilateral testing is considered an integral part of a complete examination. Limited examinations for reoccurring indications may be performed as noted. The reflux portion of the exam is performed with the patient in reverse Trendelenburg.   +---------+---------------+---------+-----------+----------+--------------+ RIGHT    CompressibilityPhasicitySpontaneityPropertiesThrombus Aging +---------+---------------+---------+-----------+----------+--------------+ CFV      Full           Yes      Yes                                 +---------+---------------+---------+-----------+----------+--------------+ SFJ      Full                                                        +---------+---------------+---------+-----------+----------+--------------+ FV Prox  Full                                                        +---------+---------------+---------+-----------+----------+--------------+ FV Mid   Full                                                        +---------+---------------+---------+-----------+----------+--------------+ FV DistalFull                                                        +---------+---------------+---------+-----------+----------+--------------+ PFV      Full                                                        +---------+---------------+---------+-----------+----------+--------------+ POP      Full           Yes      Yes                                 +---------+---------------+---------+-----------+----------+--------------+ PTV      Full                                                        +---------+---------------+---------+-----------+----------+--------------+  PERO     Full                                                        +---------+---------------+---------+-----------+----------+--------------+   +---------+---------------+---------+-----------+----------+--------------+ LEFT     CompressibilityPhasicitySpontaneityPropertiesThrombus Aging +---------+---------------+---------+-----------+----------+--------------+ CFV      Full           Yes      Yes                                  +---------+---------------+---------+-----------+----------+--------------+ SFJ      Full                                                        +---------+---------------+---------+-----------+----------+--------------+ FV Prox  Full                                                        +---------+---------------+---------+-----------+----------+--------------+ FV Mid   Full                                                        +---------+---------------+---------+-----------+----------+--------------+ FV DistalFull                                                        +---------+---------------+---------+-----------+----------+--------------+ PFV      Full                                                        +---------+---------------+---------+-----------+----------+--------------+ POP      Full           Yes      Yes                                 +---------+---------------+---------+-----------+----------+--------------+ PTV      Full                                                        +---------+---------------+---------+-----------+----------+--------------+ PERO     Full                                                        +---------+---------------+---------+-----------+----------+--------------+  Summary: BILATERAL: - No evidence of deep vein thrombosis seen in the lower extremities, bilaterally.  RIGHT: - Findings appear improved from previous examination.  LEFT: - Findings appear essentially unchanged compared to previous examination.  *See table(s) above for measurements and observations. Electronically signed by Servando Snare MD on 06/06/2019 at 12:45:27 PM.    Final    ECHOCARDIOGRAM LIMITED  Result Date: 06/05/2019    ECHOCARDIOGRAM LIMITED REPORT   Patient Name:   CHERRI YERA Date of Exam: 06/05/2019 Medical Rec #:  [id number]     Height:       65.0 in Accession #:    [id number]    Weight:       172.0 lb Date of Birth:  1945-12-26       BSA:          1.855 m Patient Age:    65 years      BP:           155/84 mmHg Patient Gender: F             HR:           94 bpm. Exam Location:  Inpatient Procedure: Limited Echo, Cardiac Doppler and Limited Color Doppler Indications:    dyspnea 786.09  History:        Patient has prior history of Echocardiogram examinations, most                 recent 05/30/2017. CHF, Covid. chronic kidney disease; Risk                 Factors:Hypertension.                 Aortic Valve: bioprosthetic valve is present in the aortic                 position.  Sonographer:    Johny Chess Referring Phys: [place]  1. Left ventricular ejection fraction, by estimation, is 50 to 55%. The left ventricle has low normal function. The left ventricle has no regional wall motion abnormalities. There is severe concentric left ventricular hypertrophy. There is hypokinesis of the left ventricular, inferior wall.  2. Right ventricular systolic function is normal. The right ventricular size is normal. Tricuspid regurgitation signal is inadequate for assessing PA pressure.  3. The mitral valve is normal in structure and function. Mild to moderate mitral valve regurgitation. No evidence of mitral stenosis.  4. The aortic valve has been repaired/replaced. There is a bioprosthetic valve present in the aortic position. Aortic valve mean gradient measures 15.5 mmHg. Aortic valve peak gradient measures 30.4 mmHg. Aortic valve area, by VTI measures 1.09 cm. There is mild perivalvular AI.  5. S/P aortic root repair. Aortic dilatation noted. There is mild dilatation of the aortic root measuring 41 mm.  6. The inferior vena cava is dilated in size with >50% respiratory variability, suggesting right atrial pressure of 8 mmHg. FINDINGS  Left Ventricle: Left ventricular ejection fraction, by estimation, is 50--55%. The left ventricle has low normal function. The left ventricle has no regional wall motion abnormalities. The  left ventricular internal cavity size was normal in size. There is severe concentric left ventricular hypertrophy. Abnormal (paradoxical) septal motion, consistent with left bundle branch block. Right Ventricle: The right ventricular size is normal. No increase in right ventricular wall thickness. Right ventricular systolic function is normal. Tricuspid regurgitation signal is inadequate for assessing PA pressure. Left Atrium: Left atrial size was normal in size. Right  Atrium: Right atrial size was normal in size. Pericardium: There is no evidence of pericardial effusion. Mitral Valve: The mitral valve is normal in structure and function. Normal mobility of the mitral valve leaflets. Mild to moderate mitral valve regurgitation. No evidence of mitral valve stenosis. Tricuspid Valve: The tricuspid valve is normal in structure. Tricuspid valve regurgitation is mild . No evidence of tricuspid stenosis. Aortic Valve: There is mild perivalvular AI. The aortic valve has been repaired/replaced. Aortic valve mean gradient measures 15.5 mmHg. Aortic valve peak gradient measures 30.4 mmHg. Aortic valve area, by VTI measures 1.09 cm. There is a bioprosthetic valve present in the aortic position. Pulmonic Valve: The pulmonic valve was normal in structure. Pulmonic valve regurgitation is mild. No evidence of pulmonic stenosis. Aorta: Aortic dilatation noted. There is mild dilatation of the aortic root measuring 41 mm. Venous: The inferior vena cava is dilated in size with greater than 50% respiratory variability, suggesting right atrial pressure of 8 mmHg. IAS/Shunts: No atrial level shunt detected by color flow Doppler.  LEFT VENTRICLE PLAX 2D LVIDd:         4.40 cm LVIDs:         3.20 cm LV PW:         2.40 cm LV IVS:        2.00 cm LVOT diam:     1.80 cm LV SV:         50 LV SV Index:   27 LVOT Area:     2.54 cm  LEFT ATRIUM         Index LA diam:    3.50 cm 1.89 cm/m  AORTIC VALVE AV Area (Vmax):    1.09 cm AV Area  (Vmean):   0.97 cm AV Area (VTI):     1.09 cm AV Vmax:           275.50 cm/s AV Vmean:          181.000 cm/s AV VTI:            0.460 m AV Peak Grad:      30.4 mmHg AV Mean Grad:      15.5 mmHg LVOT Vmax:         118.00 cm/s LVOT Vmean:        68.800 cm/s LVOT VTI:          0.198 m LVOT/AV VTI ratio: 0.43  AORTA Ao Root diam: 4.10 cm  SHUNTS Systemic VTI:  0.20 m Systemic Diam: 1.80 cm Fransico Him MD Electronically signed by Fransico Him MD Signature Date/Time: 06/05/2019/6:50:15 PM    Final       [place] Climes M.D on 06/07/2019 at 12:59 PM  Between 7am to 7pm - Pager - [phone]  After 7pm go to [email or web address] - password [facility]  [facility] -  Office  [phone]

## 2019-06-07 NOTE — Progress Notes (Signed)
Initial Nutrition Assessment  DOCUMENTATION CODES:   Not applicable  INTERVENTION:   -Magic cup TID with meals, each supplement provides 290 kcal and 9 grams of protein -MVI with minerals daily  NUTRITION DIAGNOSIS:   Increased nutrient needs related to acute illness(COVID-19) as evidenced by estimated needs.  GOAL:   Patient will meet greater than or equal to 90% of their needs  MONITOR:   PO intake, Supplement acceptance, Labs, Weight trends, Skin, I & O's  REASON FOR ASSESSMENT:   Malnutrition Screening Tool    ASSESSMENT:   [name] is a 74 y.o. female with medical history significant of pulmonary HTN; HTN; stage 3 CKD; s/p bioprosthetic AVR with AAA repair (05/2017); and chronic diastolic CHF presenting with SOB.  She reports that her sister-in-law got COVID at the beginning of the month.  Her husband's brother has frequent breakfast with her husband and brings them lunch every day.  He subsequently tested positive but generally didn't have symptoms and so apparently continued to bring lunch.  The patient's husband then got sick over a weekend but was never tested and has since recovered.  Meanwhile, she developed cough around the same time.  She was treated with Doxy but did not complete the course.  She has had persistent symptoms since the beginning of the month but they acutely worsened the last few days with worsening SOB.  She changed her Lasix dose but did not get better and so came in for further evaluation.  Pt admitted with acute respiratory failure secondary to COVID-19 infection.   Reviewed I/O's: -1.1 L x 24 hours and -1.5 L since admission  UOP: 1.8 L x 24 hours   Attempted to speak with pt via phone x 2, however, line states "user busy" at this time.   Pt with good appetite; noted meal completion 80-100%.  Wt has been stable over the past year.  Pt with increased nutritional needs and would greatly benefit from addition of oral nutrition  supplements.   Medications reviewed and include decadron and lasix.   Labs reviewed.   Diet Order:   Diet Order            Diet Heart Room service appropriate? Yes; Fluid consistency: Thin  Diet effective now              EDUCATION NEEDS:   No education needs have been identified at this time  Skin:  Skin Assessment: Reviewed RN Assessment  Last BM:  06/06/19  Height:   Ht Readings from Last 1 Encounters:  06/05/19 5\' 5"  (1.651 m)    Weight:   Wt Readings from Last 1 Encounters:  06/05/19 78 kg    Ideal Body Weight:  56.8 kg  BMI:  Body mass index is 28.62 kg/m.  Estimated Nutritional Needs:   Kcal:  1800-2000  Protein:  100-115 grams  Fluid:  > 1.8 L    Loistine Chance, RD, LDN, [place] Registered Dietitian II Certified Diabetes Care and Education Specialist Please refer to [facility] for RD and/or RD on-call/weekend/after hours pager

## 2019-06-07 NOTE — Plan of Care (Signed)
  Problem: Respiratory: ?Goal: Ability to maintain a clear airway and adequate ventilation will improve ?Outcome: Progressing ?  ?Problem: Education: ?Goal: Knowledge of General Education information will improve ?Description: Including pain rating scale, medication(s)/side effects and non-pharmacologic comfort measures ?Outcome: Progressing ?  ?Problem: Health Behavior/Discharge Planning: ?Goal: Ability to manage health-related needs will improve ?Outcome: Progressing ?  ?Problem: Clinical Measurements: ?Goal: Ability to maintain clinical measurements within normal limits will improve ?Outcome: Progressing ?Goal: Will remain free from infection ?Outcome: Progressing ?Goal: Diagnostic test results will improve ?Outcome: Progressing ?Goal: Respiratory complications will improve ?Outcome: Progressing ?Goal: Cardiovascular complication will be avoided ?Outcome: Progressing ?  ?Problem: Activity: ?Goal: Risk for activity intolerance will decrease ?Outcome: Progressing ?  ?Problem: Nutrition: ?Goal: Adequate nutrition will be maintained ?Outcome: Progressing ?  ?Problem: Coping: ?Goal: Level of anxiety will decrease ?Outcome: Progressing ?  ?Problem: Elimination: ?Goal: Will not experience complications related to bowel motility ?Outcome: Progressing ?Goal: Will not experience complications related to urinary retention ?Outcome: Progressing ?  ?Problem: Pain Managment: ?Goal: General experience of comfort will improve ?Outcome: Progressing ?  ?Problem: Safety: ?Goal: Ability to remain free from injury will improve ?Outcome: Progressing ?  ?Problem: Skin Integrity: ?Goal: Risk for impaired skin integrity will decrease ?Outcome: Progressing ?  ?

## 2019-06-08 LAB — COMPREHENSIVE METABOLIC PANEL
ALT: 21 U/L (ref 0–44)
AST: 27 U/L (ref 15–41)
Albumin: 3 g/dL — ABNORMAL LOW (ref 3.5–5.0)
Alkaline Phosphatase: 74 U/L (ref 38–126)
Anion gap: 13 (ref 5–15)
BUN: 79 mg/dL — ABNORMAL HIGH (ref 8–23)
CO2: 24 mmol/L (ref 22–32)
Calcium: 9.2 mg/dL (ref 8.9–10.3)
Chloride: 101 mmol/L (ref 98–111)
Creatinine, Ser: 2.61 mg/dL — ABNORMAL HIGH (ref 0.44–1.00)
GFR calc Af Amer: 20 mL/min — ABNORMAL LOW (ref >60)
GFR calc non Af Amer: 18 mL/min — ABNORMAL LOW (ref >60)
Glucose, Bld: 106 mg/dL — ABNORMAL HIGH (ref 70–99)
Potassium: 3.9 mmol/L (ref 3.5–5.1)
Sodium: 138 mmol/L (ref 135–145)
Total Bilirubin: 0.5 mg/dL (ref 0.3–1.2)
Total Protein: 6.8 g/dL (ref 6.5–8.1)

## 2019-06-08 LAB — CBC WITH DIFFERENTIAL/PLATELET
Abs Immature Granulocytes: 0.12 10*3/uL — ABNORMAL HIGH (ref 0.00–0.07)
Basophils Absolute: 0 10*3/uL (ref 0.0–0.1)
Basophils Relative: 0 %
Eosinophils Absolute: 0 10*3/uL (ref 0.0–0.5)
Eosinophils Relative: 0 %
HCT: 30.6 % — ABNORMAL LOW (ref 36.0–46.0)
Hemoglobin: 9.6 g/dL — ABNORMAL LOW (ref 12.0–15.0)
Immature Granulocytes: 1 %
Lymphocytes Relative: 13 %
Lymphs Abs: 1.2 10*3/uL (ref 0.7–4.0)
MCH: 30.1 pg (ref 26.0–34.0)
MCHC: 31.4 g/dL (ref 30.0–36.0)
MCV: 95.9 fL (ref 80.0–100.0)
Monocytes Absolute: 0.5 10*3/uL (ref 0.1–1.0)
Monocytes Relative: 5 %
Neutro Abs: 7.5 10*3/uL (ref 1.7–7.7)
Neutrophils Relative %: 81 %
Platelets: 168 10*3/uL (ref 150–400)
RBC: 3.19 MIL/uL — ABNORMAL LOW (ref 3.87–5.11)
RDW: 17.1 % — ABNORMAL HIGH (ref 11.5–15.5)
WBC: 9.3 10*3/uL (ref 4.0–10.5)
nRBC: 0.2 % (ref 0.0–0.2)

## 2019-06-08 LAB — FERRITIN: Ferritin: 1019 ng/mL — ABNORMAL HIGH (ref 11–307)

## 2019-06-08 LAB — C-REACTIVE PROTEIN: CRP: <0.5 mg/dL (ref <1.0)

## 2019-06-08 LAB — D-DIMER, QUANTITATIVE: D-Dimer, Quant: 3.61 ug/mL-FEU — ABNORMAL HIGH (ref 0.00–0.50)

## 2019-06-08 LAB — PHOSPHORUS: Phosphorus: 3.8 mg/dL (ref 2.5–4.6)

## 2019-06-08 LAB — MAGNESIUM: Magnesium: 2.3 mg/dL (ref 1.7–2.4)

## 2019-06-08 MED ORDER — FUROSEMIDE 10 MG/ML IJ SOLN
40.0000 mg | Freq: Two times a day (BID) | INTRAMUSCULAR | Status: DC
  Administered 2019-06-08: 40 mg via INTRAVENOUS
  Filled 2019-06-08: qty 4

## 2019-06-08 NOTE — TOC Initial Note (Signed)
Transition of Care [facility]) - Initial/Assessment Note    Patient Details  Name: [name] MRN: [id number] Date of Birth: 1945/04/19  [facility] CM/SW Contact:    [name], RN Phone Number: 06/08/2019, 4:29 PM  Clinical Narrative:   PTA independent from home with husband.  Pt has PCP and denied barriers with paying for medications.  Pt is interested in RW - agency choice given - pt chose [facility] - agency accepts referral.  Pt declined Normal as recommended.  Pt states " I have done that before, and I don't think I need that.  I just need to get up and move , I will be fine once I get back home."  Pt's husband will transport pt home at discharge                Expected Discharge Plan: Home/Self Care Barriers to Discharge: Continued Medical Work up   Patient Goals and CMS Choice   CMS Medicare.gov Compare Post Acute Care list provided to:: Patient Choice offered to / list presented to : Patient  Expected Discharge Plan and Services Expected Discharge Plan: Home/Self Care       Living arrangements for the past 2 months: Single Family Home                 DME Arranged: [place] rolling DME Agency: [facility]       HH Arranged: Refused HH          Prior Living Arrangements/Services Living arrangements for the past 2 months: Single Family Home Lives with:: Spouse Patient language and need for interpreter reviewed:: Yes Do you feel safe going back to the place where you live?: Yes      Need for Family Participation in Patient Care: Yes (Comment) Care giver support system in place?: Yes (comment) Current home services: DME(cane) Criminal Activity/Legal Involvement Pertinent to Current Situation/Hospitalization: No - Comment as needed  Activities of Daily Living Home Assistive Devices/Equipment: Other (Comment)(Single cane) ADL Screening (condition at time of admission) Patient's cognitive ability adequate to safely complete daily activities?: Yes Is the  patient deaf or have difficulty hearing?: No Does the patient have difficulty seeing, even when wearing glasses/contacts?: No Does the patient have difficulty concentrating, remembering, or making decisions?: No Patient able to express need for assistance with ADLs?: Yes Does the patient have difficulty dressing or bathing?: No Independently performs ADLs?: Yes (appropriate for developmental age) Does the patient have difficulty walking or climbing stairs?: Yes Weakness of Legs: Both Weakness of Arms/Hands: None  Permission Sought/Granted   Permission granted to share information with : Yes, Verbal Permission Granted              Emotional Assessment   Attitude/Demeanor/Rapport: Gracious, Charismatic, Self-Confident, Engaged Affect (typically observed): Accepting, Adaptable Orientation: : Oriented to Self, Oriented to Place, Oriented to  Time, Oriented to Situation   Psych Involvement: No (comment)  Admission diagnosis:  Shortness of breath [R06.02] Community acquired pneumonia of right upper lobe of lung [J18.9] Congestive heart failure, unspecified HF chronicity, unspecified heart failure type ([place]) [I50.9] Acute respiratory disease due to COVID-19 virus [U07.1, J06.9] COVID-19 [U07.1] Patient Active Problem List   Diagnosis Date Noted  . Acute respiratory disease due to COVID-19 virus 06/05/2019  . Degenerative arthritis of knee, bilateral 04/06/2019  . Encounter for general adult medical examination with abnormal findings 02/26/2019  . Degenerative disc disease, cervical 12/12/2018  . CKD (chronic kidney disease), stage IV ([place]) 08/10/2018  . Leg swelling  11/24/2017  . Bilateral leg weakness 06/26/2017  . History of aortic valve replacement with bioprosthetic valve 05/30/2017  . Ascending aortic dissection ([place])   . Chronic diastolic CHF (congestive heart failure) ([place]) 04/19/2017  . Nonrheumatic aortic valve insufficiency 03/10/2017  . Pulmonary HTN ([place]) 03/10/2017   . Neck pain 02/11/2017  . Pericardial effusion 02/04/2017  . Abnormal bone marrow examination 01/24/2017  . Stenosis of right carotid artery 08/27/2016  . LBBB (left bundle branch block) 08/27/2016  . Left-sided tinnitus 08/16/2016  . Anemia in chronic kidney disease 11/02/2015  . Complicated UTI (urinary tract infection) 04/26/2014  . OA (osteoarthritis) of knee 03/23/2012  . Vitamin D deficiency 04/13/2008  . OBESITY 02/23/2008  . Radiculopathy 02/23/2008  . Essential hypertension 02/23/2008  . Venous (peripheral) insufficiency 02/23/2008   PCP:  Hoyt Koch, MD Pharmacy:   [facility] [place], [place] [facility] [address] Phone: [phone] Fax: [phone]     Social Determinants of Health (SDOH) Interventions    Readmission Risk Interventions Readmission Risk Prevention Plan 07/15/2018  Post Dischage Appt Complete  Medication Screening Complete  Transportation Screening Complete  Some recent data might be hidden

## 2019-06-08 NOTE — Progress Notes (Signed)
PROGRESS NOTE                                                                                                                                                                                                             Patient Demographics:    [name], is a 74 y.o. female, DOB - 23-Dec-1945, VHQ:[id number]  Admit date - 06/05/2019   Admitting Physician Karmen Bongo, MD  Outpatient Primary MD for the patient is Hoyt Koch, MD  LOS - 3   Chief Complaint  Patient presents with  . Shortness of Breath       Brief Narrative   74 y.o. female with medical history significant of pulmonary HTN; HTN; stage 3 CKD; s/p bioprosthetic AVR with AAA repair (05/2017); and chronic diastolic CHF presenting with SOB.   Patient with cough, chest congestion, she tested positive for COVID-19, with new oxygen requirement, admitted for further work-up.   Subjective:    [name] today reports dyspnea has improved, still reports cough and congestion.   Assessment  & Plan :    Principal Problem:   Acute respiratory disease due to COVID-19 virus Active Problems:   Essential hypertension   Chronic diastolic CHF (congestive heart failure) (HCC)   History of aortic valve replacement with bioprosthetic valve   CKD (chronic kidney disease), stage IV (HCC)   Acute hypoxic respiratory failure due to COVID-19 infection -Patient requiring 2 L nasal cannula on admission, this appears to be improving, currently tolerating room air at rest, -imaging significant for opacity. - Continue with IV remdesivir day #4/ 5 - continue with steroids. - she was encouraged to use incentive spirometry, flutter valve, and to get out of bed to chair . - We need to trend inflammatory markers, please see discussion below regarding elevated D-dimers.  COVID-19 Labs  Recent Labs    06/06/19 0417 06/07/19 0310 06/08/19 0658  DDIMER 9.51* 6.94* 3.61*  FERRITIN 1,177* 932*  1,019*  CRP 0.6 0.6 <0.5    Lab Results  Component Value Date   SARSCOV2NAA POSITIVE (A) 06/05/2019   SARSCOV2NAA NOT DETECTED 07/11/2018   Elevated D-dimers -Venous Doppler negative for DVT, VQ scan with no evidence of PE, continue with intermediate dose of DVT prophylaxis per COVID-19 protocol at 7500 units subcu heparin 3 times daily.  Acute on chronic diastolic CHF -Patient with  prior 05/2017 echo with preserved EF and grade 1 diastolic dysfunction with acute aortic dissection which was repaired on 05/30/17 -Repeat 2D echo with EF 50 to 55%, concentric left ventricular hypertrophy(old), and hypokinesis. -Elevated BNP, with lower extremity edema with evidence of volume overload. -Low output yesterday, will increase her Lasix to 40 mg IV twice daily . -Will continue spironolactone  Stage IV CKD -As noted above, tenuous situation between need for diuresis due to CHF and advanced CKD -Will follow with daily BMP -Continue Calcitriol -Nephrology prn order set was used  HTN -Continue Norvasc, Coreg  S/p AVR -Continue ASA   Code Status : Full  Family Communication  : Tried to call husband, unable to leave voicemail on 3/1  Disposition Plan  : Home  Barriers For Discharge :  on IV remdesivir and IV steroids and IV lasix  Consults  :  None  Procedures  : None  DVT Prophylaxis  :  [place] heparin  Lab Results  Component Value Date   PLT 168 06/08/2019    Antibiotics  :   Anti-infectives (From admission, onward)   Start     Dose/Rate Route Frequency Ordered Stop   06/06/19 1000  remdesivir 100 mg in sodium chloride 0.9 % 100 mL IVPB  Status:  Discontinued     100 mg 200 mL/hr over 30 Minutes Intravenous Daily 06/05/19 0947 06/05/19 1006   06/06/19 1000  remdesivir 100 mg in sodium chloride 0.9 % 100 mL IVPB  Status:  Discontinued     100 mg 200 mL/hr over 30 Minutes Intravenous Daily 06/05/19 1007 06/05/19 1610   06/06/19 1000  remdesivir 100 mg in sodium chloride  0.9 % 100 mL IVPB     100 mg 200 mL/hr over 30 Minutes Intravenous Daily 06/05/19 1610 06/10/19 0959   06/05/19 1015  remdesivir 100 mg in sodium chloride 0.9 % 100 mL IVPB     100 mg 200 mL/hr over 30 Minutes Intravenous Every 30 min 06/05/19 1007 06/05/19 1420   06/05/19 0945  remdesivir 200 mg in sodium chloride 0.9% 250 mL IVPB  Status:  Discontinued     200 mg 580 mL/hr over 30 Minutes Intravenous Once 06/05/19 0947 06/05/19 1006   06/05/19 0830  cefTRIAXone (ROCEPHIN) 1 g in sodium chloride 0.9 % 100 mL IVPB     1 g 200 mL/hr over 30 Minutes Intravenous  Once 06/05/19 0803 06/05/19 0921   06/05/19 0830  azithromycin (ZITHROMAX) 500 mg in sodium chloride 0.9 % 250 mL IVPB     500 mg 250 mL/hr over 60 Minutes Intravenous  Once 06/05/19 0803 06/05/19 0949        Objective:   Vitals:   06/08/19 0900 06/08/19 1200 06/08/19 1402 06/08/19 1404  BP: (!) 158/87 137/73  (!) 141/89  Pulse: 81 65    Resp:  12    Temp:   98.2 F (36.8 C)   TempSrc:   Oral   SpO2:  94%    Weight:      Height:        Wt Readings from Last 3 Encounters:  06/08/19 84.4 kg  04/06/19 78 kg  02/25/19 78 kg     Intake/Output Summary (Last 24 hours) at 06/08/2019 1534 Last data filed at 06/08/2019 1500 Gross per 24 hour  Intake 2020 ml  Output 1201 ml  Net 819 ml     Physical Exam  Awake Alert, Oriented X 3, No new F.N deficits, Normal affect Symmetrical Chest wall  movement, Good air movement bilaterally, CTAB RRR,No Gallops,Rubs or new Murmurs, No Parasternal Heave +ve B.Sounds, Abd Soft, No tenderness, No rebound - guarding or rigidity. No Cyanosis, Clubbing , her edema has resolved no new Rash or bruise       Data Review:    CBC Recent Labs  Lab 06/05/19 0725 06/05/19 0745 06/06/19 0417 06/07/19 0310 06/08/19 0658  WBC 8.0  --  5.2 7.5 9.3  HGB 9.4* 9.5* 9.2* 9.0* 9.6*  HCT 29.6* 28.0* 29.2* 28.0* 30.6*  PLT 154  --  145* 144* 168  MCV 95.2  --  94.8 95.6 95.9  MCH 30.2  --   29.9 30.7 30.1  MCHC 31.8  --  31.5 32.1 31.4  RDW 16.2*  --  16.4* 16.7* 17.1*  LYMPHSABS 1.6  --  1.2 1.1 1.2  MONOABS 0.8  --  0.2 0.6 0.5  EOSABS 0.3  --  0.0 0.0 0.0  BASOSABS 0.0  --  0.0 0.0 0.0    Chemistries  Recent Labs  Lab 06/05/19 0725 06/05/19 0745 06/06/19 0417 06/07/19 0310 06/08/19 0658  NA 144 145 143 140 138  K 3.4* 3.4* 3.9 3.8 3.9  CL 106  --  106 103 101  CO2 26  --  24 27 24   GLUCOSE 103*  --  126* 121* 106*  BUN 47*  --  48* 66* 79*  CREATININE 2.35*  --  2.37* 2.76* 2.61*  CALCIUM 9.4  --  9.4 9.1 9.2  MG  --   --  1.6* 2.2 2.3  AST 27  --  29 26 27   ALT 17  --  19 18 21   ALKPHOS 85  --  74 68 74  BILITOT 0.8  --  0.7 0.8 0.5   ------------------------------------------------------------------------------------------------------------------ No results for input(s): CHOL, HDL, LDLCALC, TRIG, CHOLHDL, LDLDIRECT in the last 72 hours.  Lab Results  Component Value Date   HGBA1C 5.1 02/25/2019   ------------------------------------------------------------------------------------------------------------------ No results for input(s): TSH, T4TOTAL, T3FREE, THYROIDAB in the last 72 hours.  Invalid input(s): FREET3 ------------------------------------------------------------------------------------------------------------------ Recent Labs    06/07/19 0310 06/08/19 0658  FERRITIN 932* 1,019*    Coagulation profile Recent Labs  Lab 06/05/19 0725  INR 1.0    Recent Labs    06/07/19 0310 06/08/19 0658  DDIMER 6.94* 3.61*    Cardiac Enzymes No results for input(s): CKMB, TROPONINI, MYOGLOBIN in the last 168 hours.  Invalid input(s): CK ------------------------------------------------------------------------------------------------------------------    Component Value Date/Time   BNP 994.8 (H) 06/05/2019 0725    Inpatient Medications  Scheduled Meds: . amLODipine  5 mg Oral QHS  . aspirin EC  81 mg Oral Daily  . calcitRIOL   0.25 mcg Oral Once per day on Mon Fri  . carvedilol  25 mg Oral BID WC  . dexamethasone (DECADRON) injection  6 mg Intravenous Q24H  . furosemide  40 mg Intravenous Daily  . heparin injection (subcutaneous)  5,000 Units Subcutaneous Q8H  . multivitamin with minerals  1 tablet Oral Daily  . sodium chloride flush  3 mL Intravenous Q12H  . sodium chloride flush  3 mL Intravenous Q12H   Continuous Infusions: . sodium chloride    . remdesivir 100 mg in NS 100 mL 100 mg (06/08/19 0910)   PRN Meds:.sodium chloride, acetaminophen, albuterol, bisacodyl, calcium carbonate (dosed in mg elemental calcium), camphor-menthol **AND** hydrOXYzine, chlorpheniramine-HYDROcodone, docusate sodium, feeding supplement (NEPRO CARB STEADY), guaiFENesin-dextromethorphan, ondansetron **OR** ondansetron (ZOFRAN) IV, oxyCODONE, polyethylene glycol, polyvinyl alcohol, sodium chloride flush,  sodium phosphate, sorbitol, zolpidem  Micro Results Recent Results (from the past 240 hour(s))  Respiratory Panel by RT PCR (Flu A&B, Covid) - Nasopharyngeal Swab     Status: Abnormal   Collection Time: 06/05/19  7:25 AM   Specimen: Nasopharyngeal Swab  Result Value Ref Range Status   SARS Coronavirus 2 by RT PCR POSITIVE (A) NEGATIVE Final    Comment: RESULT CALLED TO, READ BACK BY AND VERIFIED WITH: N KOONTZ ON [id number] AT 0907 BY NFIELDS (NOTE) SARS-CoV-2 target nucleic acids are DETECTED. SARS-CoV-2 RNA is generally detectable in upper respiratory specimens  during the acute phase of infection. Positive results are indicative of the presence of the identified virus, but do not rule out bacterial infection or co-infection with other pathogens not detected by the test. Clinical correlation with patient history and other diagnostic information is necessary to determine patient infection status. The expected result is Negative. Fact Sheet for Patients:  PinkCheek.be Fact Sheet for Healthcare  Providers: GravelBags.it This test is not yet approved or cleared by the [place] FDA and  has been authorized for detection and/or diagnosis of SARS-CoV-2 by FDA under an Emergency Use Authorization (EUA).  This EUA will remain in effect (meaning this test can be used)  for the duration of  the COVID-19 declaration under Section 564(b)(1) of the Act, 21 U.S.C. section 360bbb-3(b)(1), unless the authorization is terminated or revoked sooner.    Influenza A by PCR NEGATIVE NEGATIVE Final   Influenza B by PCR NEGATIVE NEGATIVE Final    Comment: (NOTE) The Xpert Xpress SARS-CoV-2/FLU/RSV assay is intended as an aid in  the diagnosis of influenza from Nasopharyngeal swab specimens and  should not be used as a sole basis for treatment. Nasal washings and  aspirates are unacceptable for Xpert Xpress SARS-CoV-2/FLU/RSV  testing. Fact Sheet for Patients: PinkCheek.be Fact Sheet for Healthcare Providers: GravelBags.it This test is not yet approved or cleared by the [place] FDA and  has been authorized for detection and/or diagnosis of SARS-CoV-2 by  FDA under an Emergency Use Authorization (EUA). This EUA will remain  in effect (meaning this test can be used) for the duration of the  Covid-19 declaration under Section 564(b)(1) of the Act, 21  U.S.C. section 360bbb-3(b)(1), unless the authorization is  terminated or revoked. Performed at [facility], [place] [address] [place], [place] [address]     Radiology Reports NM Pulmonary Perfusion  Result Date: 06/05/2019 CLINICAL DATA:  Hypoxemia. Pulmonary hypertension. [place] 3 kidney disease. EXAM: NUCLEAR MEDICINE PERFUSION LUNG SCAN TECHNIQUE: Perfusion images were obtained in multiple projections after intravenous injection of radiopharmaceutical. Ventilation scans intentionally deferred if perfusion scan and chest x-ray adequate for  interpretation during COVID 19 epidemic. RADIOPHARMACEUTICALS:  1.5 mCi Tc-76m MAA IV COMPARISON:  Chest radiograph 06/05/2019 FINDINGS: Posterolateral left lower lung decreased perfusion, at the site of airspace disease on 06/05/2019 plain film. Otherwise, no wedge-shaped defects to suggest acute pulmonary embolism. IMPRESSION: No findings to suggest pulmonary embolism. Electronically Signed   By: Abigail Miyamoto M.D.   On: 06/05/2019 16:33   DG Chest Port 1 View  Result Date: 06/05/2019 CLINICAL DATA:  Difficulty breathing for 1 week. EXAM: PORTABLE CHEST 1 VIEW COMPARISON:  07/15/2018 FINDINGS: 0733 hours. The cardio pericardial silhouette is enlarged. There is pulmonary vascular congestion without overt pulmonary edema. New right parahilar airspace disease noted with similar retrocardiac chronic atelectasis or scarring. The visualized bony structures of the thorax are intact. Telemetry leads overlie the chest. IMPRESSION: 1.  New right parahilar airspace disease compatible with pneumonia. 2. Stable retrocardiac atelectasis or scarring. 3. Pulmonary vascular congestion without overt pulmonary edema. Electronically Signed   By: Misty Stanley M.D.   On: 06/05/2019 07:55   VAS [place] LOWER EXTREMITY VENOUS (DVT)  Result Date: 06/06/2019  Lower Venous DVTStudy Indications: Covid-19, elevated D-Dimer.  Limitations: Poor ultrasound/tissue interface and edema. Comparison Study: Prior study from 07/15/18 is available for comparison Performing Technologist: Sharion Dove RVS  Examination Guidelines: A complete evaluation includes B-mode imaging, spectral Doppler, color Doppler, and power Doppler as needed of all accessible portions of each vessel. Bilateral testing is considered an integral part of a complete examination. Limited examinations for reoccurring indications may be performed as noted. The reflux portion of the exam is performed with the patient in reverse Trendelenburg.   +---------+---------------+---------+-----------+----------+--------------+ RIGHT    CompressibilityPhasicitySpontaneityPropertiesThrombus Aging +---------+---------------+---------+-----------+----------+--------------+ CFV      Full           Yes      Yes                                 +---------+---------------+---------+-----------+----------+--------------+ SFJ      Full                                                        +---------+---------------+---------+-----------+----------+--------------+ FV Prox  Full                                                        +---------+---------------+---------+-----------+----------+--------------+ FV Mid   Full                                                        +---------+---------------+---------+-----------+----------+--------------+ FV DistalFull                                                        +---------+---------------+---------+-----------+----------+--------------+ PFV      Full                                                        +---------+---------------+---------+-----------+----------+--------------+ POP      Full           Yes      Yes                                 +---------+---------------+---------+-----------+----------+--------------+ PTV      Full                                                        +---------+---------------+---------+-----------+----------+--------------+  PERO     Full                                                        +---------+---------------+---------+-----------+----------+--------------+   +---------+---------------+---------+-----------+----------+--------------+ LEFT     CompressibilityPhasicitySpontaneityPropertiesThrombus Aging +---------+---------------+---------+-----------+----------+--------------+ CFV      Full           Yes      Yes                                  +---------+---------------+---------+-----------+----------+--------------+ SFJ      Full                                                        +---------+---------------+---------+-----------+----------+--------------+ FV Prox  Full                                                        +---------+---------------+---------+-----------+----------+--------------+ FV Mid   Full                                                        +---------+---------------+---------+-----------+----------+--------------+ FV DistalFull                                                        +---------+---------------+---------+-----------+----------+--------------+ PFV      Full                                                        +---------+---------------+---------+-----------+----------+--------------+ POP      Full           Yes      Yes                                 +---------+---------------+---------+-----------+----------+--------------+ PTV      Full                                                        +---------+---------------+---------+-----------+----------+--------------+ PERO     Full                                                        +---------+---------------+---------+-----------+----------+--------------+  Summary: BILATERAL: - No evidence of deep vein thrombosis seen in the lower extremities, bilaterally.  RIGHT: - Findings appear improved from previous examination.  LEFT: - Findings appear essentially unchanged compared to previous examination.  *See table(s) above for measurements and observations. Electronically signed by Servando Snare MD on 06/06/2019 at 12:45:27 PM.    Final    ECHOCARDIOGRAM LIMITED  Result Date: 06/05/2019    ECHOCARDIOGRAM LIMITED REPORT   Patient Name:   LAURRIE TOPPIN Date of Exam: 06/05/2019 Medical Rec #:  [id number]     Height:       65.0 in Accession #:    [id number]    Weight:       172.0 lb Date of Birth:  01-27-1946       BSA:          1.855 m Patient Age:    89 years      BP:           155/84 mmHg Patient Gender: F             HR:           94 bpm. Exam Location:  Inpatient Procedure: Limited Echo, Cardiac Doppler and Limited Color Doppler Indications:    dyspnea 786.09  History:        Patient has prior history of Echocardiogram examinations, most                 recent 05/30/2017. CHF, Covid. chronic kidney disease; Risk                 Factors:Hypertension.                 Aortic Valve: bioprosthetic valve is present in the aortic                 position.  Sonographer:    Johny Chess Referring Phys: [place]  1. Left ventricular ejection fraction, by estimation, is 50 to 55%. The left ventricle has low normal function. The left ventricle has no regional wall motion abnormalities. There is severe concentric left ventricular hypertrophy. There is hypokinesis of the left ventricular, inferior wall.  2. Right ventricular systolic function is normal. The right ventricular size is normal. Tricuspid regurgitation signal is inadequate for assessing PA pressure.  3. The mitral valve is normal in structure and function. Mild to moderate mitral valve regurgitation. No evidence of mitral stenosis.  4. The aortic valve has been repaired/replaced. There is a bioprosthetic valve present in the aortic position. Aortic valve mean gradient measures 15.5 mmHg. Aortic valve peak gradient measures 30.4 mmHg. Aortic valve area, by VTI measures 1.09 cm. There is mild perivalvular AI.  5. S/P aortic root repair. Aortic dilatation noted. There is mild dilatation of the aortic root measuring 41 mm.  6. The inferior vena cava is dilated in size with >50% respiratory variability, suggesting right atrial pressure of 8 mmHg. FINDINGS  Left Ventricle: Left ventricular ejection fraction, by estimation, is 50--55%. The left ventricle has low normal function. The left ventricle has no regional wall motion abnormalities. The  left ventricular internal cavity size was normal in size. There is severe concentric left ventricular hypertrophy. Abnormal (paradoxical) septal motion, consistent with left bundle branch block. Right Ventricle: The right ventricular size is normal. No increase in right ventricular wall thickness. Right ventricular systolic function is normal. Tricuspid regurgitation signal is inadequate for assessing PA pressure. Left Atrium: Left atrial size was normal in size. Right  Atrium: Right atrial size was normal in size. Pericardium: There is no evidence of pericardial effusion. Mitral Valve: The mitral valve is normal in structure and function. Normal mobility of the mitral valve leaflets. Mild to moderate mitral valve regurgitation. No evidence of mitral valve stenosis. Tricuspid Valve: The tricuspid valve is normal in structure. Tricuspid valve regurgitation is mild . No evidence of tricuspid stenosis. Aortic Valve: There is mild perivalvular AI. The aortic valve has been repaired/replaced. Aortic valve mean gradient measures 15.5 mmHg. Aortic valve peak gradient measures 30.4 mmHg. Aortic valve area, by VTI measures 1.09 cm. There is a bioprosthetic valve present in the aortic position. Pulmonic Valve: The pulmonic valve was normal in structure. Pulmonic valve regurgitation is mild. No evidence of pulmonic stenosis. Aorta: Aortic dilatation noted. There is mild dilatation of the aortic root measuring 41 mm. Venous: The inferior vena cava is dilated in size with greater than 50% respiratory variability, suggesting right atrial pressure of 8 mmHg. IAS/Shunts: No atrial level shunt detected by color flow Doppler.  LEFT VENTRICLE PLAX 2D LVIDd:         4.40 cm LVIDs:         3.20 cm LV PW:         2.40 cm LV IVS:        2.00 cm LVOT diam:     1.80 cm LV SV:         50 LV SV Index:   27 LVOT Area:     2.54 cm  LEFT ATRIUM         Index LA diam:    3.50 cm 1.89 cm/m  AORTIC VALVE AV Area (Vmax):    1.09 cm AV Area  (Vmean):   0.97 cm AV Area (VTI):     1.09 cm AV Vmax:           275.50 cm/s AV Vmean:          181.000 cm/s AV VTI:            0.460 m AV Peak Grad:      30.4 mmHg AV Mean Grad:      15.5 mmHg LVOT Vmax:         118.00 cm/s LVOT Vmean:        68.800 cm/s LVOT VTI:          0.198 m LVOT/AV VTI ratio: 0.43  AORTA Ao Root diam: 4.10 cm  SHUNTS Systemic VTI:  0.20 m Systemic Diam: 1.80 cm Fransico Him MD Electronically signed by Fransico Him MD Signature Date/Time: 06/05/2019/6:50:15 PM    Final       Phillips Climes M.D on 06/08/2019 at 3:34 PM  Between 7am to 7pm - Pager - [phone]  After 7pm go to [email or web address] - password [email or web address]  [facility] -  Office  [phone]

## 2019-06-08 NOTE — Plan of Care (Signed)
  Problem: Respiratory: Goal: Ability to maintain a clear airway and adequate ventilation will improve Outcome: Progressing   Problem: Activity: Goal: Risk for activity intolerance will decrease Outcome: Progressing

## 2019-06-08 NOTE — Evaluation (Addendum)
Physical Therapy Evaluation Patient Details Name: [name] MRN: [id number] DOB: 1946/01/28 Today's Date: 06/08/2019   History of Present Illness  74 year old female admitted 06/05/19 with SOB. Patient's sister-in-law had COVID at the beginning of the month and husband's brother has frequent breakfast with husband and brings lunch every day. Husband became sick of the weekend but never got tested. Patient developed cough around same time and treated with Doxy but did not complete course. Patient with worsening SOB. She changed her Lasix dose but did not get better so came to the hospital for further evaluation. +COVID. CXR with ? CAP, possibly CHF. Patient receiving IV Remdesivir and steroids. BLE doppler negative for DVT. VQ scan negative for PE. PMH: pulmonary HTN; HTN; stage 3 CKD; s/p bioprosthetic AVR with AAA repair (05/2017); and chronic diastolic CHF     Clinical Impression  Patient presents with DOE. Oxygen saturation stable on room air during session. Patient with arthritis in her knees which she receives injections for. Close supervision for short distance mobility using RW. At baseline, patient is mostly a household ambulator. She only leaves her house for MD appointments and uses a borrowed wheelchair for the walkway to access the car. She uses a BSC for toileting at all times.  Recommend continued skilled PT services and home PT.  Room air SpO2 95%, RR 21, HR 86 bpm at rest Post ambulation SpO2 92%, HR 94 bpm    Follow Up Recommendations Home health PT;Supervision for mobility/OOB    Equipment Recommendations  Rolling walker with 5" wheels((patient only owns standard walker))       Precautions / Restrictions Precautions Precautions: Fall(I+O) Precaution Comments: history of arthritis in knees per patient Restrictions Weight Bearing Restrictions: No      Mobility  Bed Mobility Overal bed mobility: Needs Assistance Bed Mobility: Supine to Sit     Supine to sit:  Supervision;HOB elevated(HOB approx 20 degrees as patient can't tolerate lying flat)     General bed mobility comments: Patient usually sleeps in her bed but was sleeping in recliner chair recently due to symptoms/cough  Transfers Overall transfer level: Needs assistance Equipment used: Rolling walker (2 wheeled) Transfers: Sit to/from [facility] Sit to Stand: Supervision;Min guard Stand pivot transfers: Supervision;Min guard       General transfer comment: cl supervision/contact guard (mainly close supervision), stand-step transfer EOB>chair, sit<>stand from recliner chair with supervision  Ambulation/Gait Ambulation/Gait assistance: Supervision Gait Distance (Feet): 20 Feet Assistive device: Rolling walker (2 wheeled) Gait Pattern/deviations: Decreased step length - right;Decreased step length - left Gait velocity: decreased       Balance Overall balance assessment: Mild deficits observed, not formally tested         Pertinent Vitals/Pain Pain Assessment: 0-10 Pain Score: 9  Pain Location: L knee Pain Descriptors / Indicators: Aching(stiffness) Pain Intervention(s): Monitored during session;Repositioned;Limited activity within patient's tolerance    Home Living Family/patient expects to be discharged to:: Private residence Living Arrangements: Spouse/significant other   Type of Home: House Home Access: Stairs to enter Entrance Stairs-Rails: Left Entrance Stairs-Number of Steps: 1 Home Layout: One level Home Equipment: Grab bars - tub/shower;Walker - standard;Cane - single point;Wheelchair - manual;Bedside commode((wheelchair is borrowed)) Additional Comments: has standard walker, SPC, BSC, wheelchair borrowed, sponge bathe most of the time, tub/shower combo with GB    Prior Function Level of Independence: Independent with assistive device(s)   Gait / Transfers Assistance Needed: modI with [place] or furniture walking, wheelchair for accessing car,  husband drives, one  step with to access home with L rail at walkway and rail on porch     Comments: modI with SPC or furniture walks, home O2 at night only but recently was using during the day du3e to symptoms, brother-in-law brings lunch 5 days a week for 2 years, has MOW 5 days per week, uses BSC for toileting, husband assists patient into the home (sometimes sleeps in recliner chair, normally sleeps in bed)        Extremity/Trunk Assessment     Lower Extremity Assessment Lower Extremity Assessment: RLE deficits/detail;LLE deficits/detail RLE Deficits / Details: grossly 4/5 RLE Sensation: (denies numbness or tingling) LLE Deficits / Details: grossly 4/5 LLE Sensation: (denies numbness or tingling)     Communication   Communication: No difficulties  Cognition Arousal/Alertness: Awake/alert      General Comments: talkative during session      General Comments General comments (skin integrity, edema, etc.): Patient reports she has a standard walker at home without wheels. She inquired about getting a walker with wheels from her doctor but has not been able to get one.    Exercises Other Exercises Other Exercises: flutter 2 x 5 reps Other Exercises: incentive spirometer x 10, max 750   Assessment/Plan    PT Assessment Patient needs continued PT services  PT Problem List Decreased strength;Decreased activity tolerance;Decreased balance;Cardiopulmonary status limiting activity;Pain       PT Treatment Interventions DME instruction;Gait training;Stair training;Functional mobility training;Therapeutic activities;Therapeutic exercise;Balance training;Patient/family education    PT Goals (Current goals can be found in the Care Plan section)  Acute Rehab PT Goals Patient Stated Goal: to walk PT Goal Formulation: With patient Time For Goal Achievement: 06/21/19 Potential to Achieve Goals: Good    Frequency Min 3X/week   Barriers to discharge   none anticipated        AM-PAC PT "6 Clicks" Mobility  Outcome Measure Help needed turning from your back to your side while in a flat bed without using bedrails?: None Help needed moving from lying on your back to sitting on the side of a flat bed without using bedrails?: None Help needed moving to and from a bed to a chair (including a wheelchair)?: A Little Help needed standing up from a chair using your arms (e.g., wheelchair or bedside chair)?: A Little Help needed to walk in hospital room?: A Little Help needed climbing 3-5 steps with a railing? : A Little 6 Click Score: 20    End of Session   Activity Tolerance: Patient limited by fatigue;Patient tolerated treatment well;Patient limited by pain Patient left: in chair;with call bell/phone within reach;with chair alarm set Nurse Communication: Mobility status PT Visit Diagnosis: Other abnormalities of gait and mobility (R26.89);Muscle weakness (generalized) (M62.81);Difficulty in walking, not elsewhere classified (R26.2)    Time: 9147-8295 PT Time Calculation (min) (ACUTE ONLY): 55 min   Charges:   PT Evaluation $PT Eval Moderate Complexity: 1 Mod PT Treatments $Therapeutic Activity: 8-22 mins       Birdie Hopes, DPT, PT Acute Rehab [phone] office    Birdie Hopes 06/08/2019, 10:22 AM

## 2019-06-09 DIAGNOSIS — I10 Essential (primary) hypertension: Secondary | ICD-10-CM

## 2019-06-09 LAB — COMPREHENSIVE METABOLIC PANEL
ALT: 23 U/L (ref 0–44)
AST: 24 U/L (ref 15–41)
Albumin: 2.9 g/dL — ABNORMAL LOW (ref 3.5–5.0)
Alkaline Phosphatase: 70 U/L (ref 38–126)
Anion gap: 12 (ref 5–15)
BUN: 92 mg/dL — ABNORMAL HIGH (ref 8–23)
CO2: 24 mmol/L (ref 22–32)
Calcium: 8.9 mg/dL (ref 8.9–10.3)
Chloride: 99 mmol/L (ref 98–111)
Creatinine, Ser: 2.49 mg/dL — ABNORMAL HIGH (ref 0.44–1.00)
GFR calc Af Amer: 21 mL/min — ABNORMAL LOW (ref >60)
GFR calc non Af Amer: 19 mL/min — ABNORMAL LOW (ref >60)
Glucose, Bld: 118 mg/dL — ABNORMAL HIGH (ref 70–99)
Potassium: 4 mmol/L (ref 3.5–5.1)
Sodium: 135 mmol/L (ref 135–145)
Total Bilirubin: 0.6 mg/dL (ref 0.3–1.2)
Total Protein: 6.3 g/dL — ABNORMAL LOW (ref 6.5–8.1)

## 2019-06-09 LAB — CBC WITH DIFFERENTIAL/PLATELET
Abs Immature Granulocytes: 0.18 10*3/uL — ABNORMAL HIGH (ref 0.00–0.07)
Basophils Absolute: 0 10*3/uL (ref 0.0–0.1)
Basophils Relative: 0 %
Eosinophils Absolute: 0 10*3/uL (ref 0.0–0.5)
Eosinophils Relative: 0 %
HCT: 32.9 % — ABNORMAL LOW (ref 36.0–46.0)
Hemoglobin: 10.3 g/dL — ABNORMAL LOW (ref 12.0–15.0)
Immature Granulocytes: 2 %
Lymphocytes Relative: 13 %
Lymphs Abs: 1.4 10*3/uL (ref 0.7–4.0)
MCH: 30.7 pg (ref 26.0–34.0)
MCHC: 31.3 g/dL (ref 30.0–36.0)
MCV: 97.9 fL (ref 80.0–100.0)
Monocytes Absolute: 1 10*3/uL (ref 0.1–1.0)
Monocytes Relative: 9 %
Neutro Abs: 8.1 10*3/uL — ABNORMAL HIGH (ref 1.7–7.7)
Neutrophils Relative %: 76 %
Platelets: 166 10*3/uL (ref 150–400)
RBC: 3.36 MIL/uL — ABNORMAL LOW (ref 3.87–5.11)
RDW: 17.1 % — ABNORMAL HIGH (ref 11.5–15.5)
WBC: 10.6 10*3/uL — ABNORMAL HIGH (ref 4.0–10.5)
nRBC: 0.8 % — ABNORMAL HIGH (ref 0.0–0.2)

## 2019-06-09 LAB — D-DIMER, QUANTITATIVE: D-Dimer, Quant: 3.18 ug/mL-FEU — ABNORMAL HIGH (ref 0.00–0.50)

## 2019-06-09 LAB — PHOSPHORUS: Phosphorus: 3.7 mg/dL (ref 2.5–4.6)

## 2019-06-09 LAB — FERRITIN: Ferritin: 1053 ng/mL — ABNORMAL HIGH (ref 11–307)

## 2019-06-09 LAB — C-REACTIVE PROTEIN: CRP: 0.5 mg/dL (ref <1.0)

## 2019-06-09 LAB — MAGNESIUM: Magnesium: 2.2 mg/dL (ref 1.7–2.4)

## 2019-06-09 MED ORDER — NEPRO/CARBSTEADY PO LIQD: 237.0000 mL | Freq: Three times a day (TID) | ORAL | 0 refills | Status: AC | PRN

## 2019-06-09 MED ORDER — FUROSEMIDE 10 MG/ML IJ SOLN
40.0000 mg | Freq: Two times a day (BID) | INTRAMUSCULAR | Status: DC
  Filled 2019-06-09: qty 4

## 2019-06-09 NOTE — Care Management (Signed)
Pt deemed stable for discharge home today.  CM reviewed chart for outstanding TOC needs - none determined.  CM signing off

## 2019-06-09 NOTE — Discharge Instructions (Signed)
Person Under Monitoring Name: [name]  Location: [address] [place] [address]   Infection Prevention Recommendations for Individuals Confirmed to have, or Being Evaluated for, 2019 Novel Coronavirus (COVID-19) Infection Who Receive Care at Home  Individuals who are confirmed to have, or are being evaluated for, COVID-19 should follow the prevention steps below until a healthcare provider or local or state health department says they can return to normal activities.  Stay home except to get medical care You should restrict activities outside your home, except for getting medical care. Do not go to work, school, or public areas, and do not use public transportation or taxis.  Call ahead before visiting your doctor Before your medical appointment, call the healthcare provider and tell them that you have, or are being evaluated for, COVID-19 infection. This will help the healthcare provider's office take steps to keep other people from getting infected. Ask your healthcare provider to call the local or state health department.  Monitor your symptoms Seek prompt medical attention if your illness is worsening (e.g., difficulty breathing). Before going to your medical appointment, call the healthcare provider and tell them that you have, or are being evaluated for, COVID-19 infection. Ask your healthcare provider to call the local or state health department.  Wear a facemask You should wear a facemask that covers your nose and mouth when you are in the same room with other people and when you visit a healthcare provider. People who live with or visit you should also wear a facemask while they are in the same room with you.  Separate yourself from other people in your home As much as possible, you should stay in a different room from other people in your home. Also, you should use a separate bathroom, if available.  Avoid sharing household items You should not share  dishes, drinking glasses, cups, eating utensils, towels, bedding, or other items with other people in your home. After using these items, you should wash them thoroughly with soap and water.  Cover your coughs and sneezes Cover your mouth and nose with a tissue when you cough or sneeze, or you can cough or sneeze into your sleeve. Throw used tissues in a lined trash can, and immediately wash your hands with soap and water for at least 20 seconds or use an alcohol-based hand rub.  Wash your [facility] your hands often and thoroughly with soap and water for at least 20 seconds. You can use an alcohol-based hand sanitizer if soap and water are not available and if your hands are not visibly dirty. Avoid touching your eyes, nose, and mouth with unwashed hands.   Prevention Steps for Caregivers and Household Members of Individuals Confirmed to have, or Being Evaluated for, COVID-19 Infection Being Cared for in the Home  If you live with, or provide care at home for, a person confirmed to have, or being evaluated for, COVID-19 infection please follow these guidelines to prevent infection:  Follow healthcare provider's instructions Make sure that you understand and can help the patient follow any healthcare provider instructions for all care.  Provide for the patient's basic needs You should help the patient with basic needs in the home and provide support for getting groceries, prescriptions, and other personal needs.  Monitor the patient's symptoms If they are getting sicker, call his or her medical provider and tell them that the patient has, or is being evaluated for, COVID-19 infection. This will help the healthcare provider's office take  steps to keep other people from getting infected. Ask the healthcare provider to call the local or state health department.  Limit the number of people who have contact with the patient  If possible, have only one caregiver for the patient.  Other  household members should stay in another home or place of residence. If this is not possible, they should stay  in another room, or be separated from the patient as much as possible. Use a separate bathroom, if available.  Restrict visitors who do not have an essential need to be in the home.  Keep older adults, very young children, and other sick people away from the patient Keep older adults, very young children, and those who have compromised immune systems or chronic health conditions away from the patient. This includes people with chronic heart, lung, or kidney conditions, diabetes, and cancer.  Ensure good ventilation Make sure that shared spaces in the home have good air flow, such as from an air conditioner or an opened window, weather permitting.  Wash your hands often  Wash your hands often and thoroughly with soap and water for at least 20 seconds. You can use an alcohol based hand sanitizer if soap and water are not available and if your hands are not visibly dirty.  Avoid touching your eyes, nose, and mouth with unwashed hands.  Use disposable paper towels to dry your hands. If not available, use dedicated cloth towels and replace them when they become wet.  Wear a facemask and gloves  Wear a disposable facemask at all times in the room and gloves when you touch or have contact with the patient's blood, body fluids, and/or secretions or excretions, such as sweat, saliva, sputum, nasal mucus, vomit, urine, or feces.  Ensure the mask fits over your nose and mouth tightly, and do not touch it during use.  Throw out disposable facemasks and gloves after using them. Do not reuse.  Wash your hands immediately after removing your facemask and gloves.  If your personal clothing becomes contaminated, carefully remove clothing and launder. Wash your hands after handling contaminated clothing.  Place all used disposable facemasks, gloves, and other waste in a lined container before  disposing them with other household waste.  Remove gloves and wash your hands immediately after handling these items.  Do not share dishes, glasses, or other household items with the patient  Avoid sharing household items. You should not share dishes, drinking glasses, cups, eating utensils, towels, bedding, or other items with a patient who is confirmed to have, or being evaluated for, COVID-19 infection.  After the person uses these items, you should wash them thoroughly with soap and water.  Wash laundry thoroughly  Immediately remove and wash clothes or bedding that have blood, body fluids, and/or secretions or excretions, such as sweat, saliva, sputum, nasal mucus, vomit, urine, or feces, on them.  Wear gloves when handling laundry from the patient.  Read and follow directions on labels of laundry or clothing items and detergent. In general, wash and dry with the warmest temperatures recommended on the label.  Clean all areas the individual has used often  Clean all touchable surfaces, such as counters, tabletops, doorknobs, bathroom fixtures, toilets, phones, keyboards, tablets, and bedside tables, every day. Also, clean any surfaces that may have blood, body fluids, and/or secretions or excretions on them.  Wear gloves when cleaning surfaces the patient has come in contact with.  Use a diluted bleach solution (e.g., dilute bleach with 1 part bleach  and 10 parts water) or a household disinfectant with a label that says EPA-registered for coronaviruses. To make a bleach solution at home, add 1 tablespoon of bleach to 1 quart (4 cups) of water. For a larger supply, add  cup of bleach to 1 gallon (16 cups) of water.  Read labels of cleaning products and follow recommendations provided on product labels. Labels contain instructions for safe and effective use of the cleaning product including precautions you should take when applying the product, such as wearing gloves or eye protection  and making sure you have good ventilation during use of the product.  Remove gloves and wash hands immediately after cleaning.  Monitor yourself for signs and symptoms of illness Caregivers and household members are considered close contacts, should monitor their health, and will be asked to limit movement outside of the home to the extent possible. Follow the monitoring steps for close contacts listed on the symptom monitoring form.   ? If you have additional questions, contact your local health department or call the epidemiologist on call at [phone] (available 24/7). ? This guidance is subject to change. For the most up-to-date guidance from [facility], please refer to their website: [email or web address]

## 2019-06-09 NOTE — Discharge Summary (Signed)
PATIENT DETAILS Name: [name] Age: 74 y.o. Sex: female Date of Birth: 1945/09/16 MRN: [id number]. Admitting Physician: Karmen Bongo, MD OYD:XAJOINOM, Real Cons, MD  Admit Date: 06/05/2019 Discharge date: 06/09/2019  Recommendations for Outpatient Follow-up:  1. Follow up with PCP in 1-2 weeks 2. Please obtain CMP/CBC in one week 3. Repeat Chest Xray in 4-6 week  Admitted From:  Home  Disposition: Home with home health [place]: Yes  Equipment/Devices: None  Discharge Condition: Stable  CODE STATUS: FULL CODE  Diet recommendation:  Diet Order            Diet - low sodium heart healthy        Diet Heart Room service appropriate? Yes; Fluid consistency: Thin  Diet effective now               Brief Summary: See H&P, Labs, Consult and Test reports for all details in brief, 74 y.o.femalewith medical history significant ofpulmonary HTN; HTN; stage 3 CKD;s/p bioprosthetic AVR withAAArepair (05/2017); and chronic diastolic CHF presenting with SOB.  Patient with cough, chest congestion, she tested positive for COVID-19, with new oxygen requirement-chest x-ray was positive for pneumonia.  She was subsequently admitted to the hospitalist service.  See below for further details.  Brief Hospital Course: Acute Hypoxic Respiratory Failure secondary to COVID-19 pneumonia: Treated with steroids and remdesivir-initially requiring 2 L of oxygen-but has been titrated to room air.  She feels significantly better and is requesting discharge today.  She appears very comfortable.  She will complete her last day of remdesivir on 3/3-following which she will be discharged home today.  COVID-19 Labs:  Recent Labs    06/07/19 0310 06/08/19 0658 06/09/19 0340  DDIMER 6.94* 3.61* 3.18*  FERRITIN 932* 1,019* 1,053*  CRP 0.6 <0.5 0.5    Lab Results  Component Value Date   SARSCOV2NAA POSITIVE (A) 06/05/2019   SARSCOV2NAA NOT DETECTED 07/11/2018      COVID-19 Medications: Steroids: 2/27>> 3/3 Remdesivir: 2/27>> 3/3  Elevated D-dimer: Likely secondary to COVID-19-lower extremity Doppler negative for DVT, VQ scan not suggestive of PE.  As noted above-no longer hypoxic-and is on room air.  Continue aspirin on discharge.  Acute on chronic diastolic heart failure: Treated with IV Lasix-now euvolemic.  Resume Lasix/Aldactone on discharge.  CKD stage IV: Creatinine close to baseline-follow with outpatient [facility] for further continued care.  HTN: Controlled-continue Norvasc and Coreg  S/p AVR: Follow with cardiology.  Nutrition Problem: Nutrition Problem: Increased nutrient needs Etiology: acute illness(COVID-19) Signs/Symptoms: estimated needs Interventions: MVI, Magic cup  Obesity: Estimated body mass index is 31.05 kg/m as calculated from the following:   Height as of this encounter: 5\' 5"  (1.651 m).   Weight as of this encounter: 84.6 kg.    Procedures/Studies: None  Discharge Diagnoses:  Principal Problem:   Acute respiratory disease due to COVID-19 virus Active Problems:   Essential hypertension   Chronic diastolic CHF (congestive heart failure) (HCC)   History of aortic valve replacement with bioprosthetic valve   CKD (chronic kidney disease), stage IV [facility])   Discharge Instructions:    Person Under Monitoring Name: [name]  Location: [address] [place] [address]   Infection Prevention Recommendations for Individuals Confirmed to have, or Being Evaluated for, 2019 Novel Coronavirus (COVID-19) Infection Who Receive Care at Home  Individuals who are confirmed to have, or are being evaluated for, COVID-19 should follow the prevention steps below until a healthcare provider or local or state health department  says they can return to normal activities.  Stay home except to get medical care You should restrict activities outside your home, except for getting medical care. Do not go to work, school,  or public areas, and do not use public transportation or taxis.  Call ahead before visiting your doctor Before your medical appointment, call the healthcare provider and tell them that you have, or are being evaluated for, COVID-19 infection. This will help the healthcare provider's office take steps to keep other people from getting infected. Ask your healthcare provider to call the local or state health department.  Monitor your symptoms Seek prompt medical attention if your illness is worsening (e.g., difficulty breathing). Before going to your medical appointment, call the healthcare provider and tell them that you have, or are being evaluated for, COVID-19 infection. Ask your healthcare provider to call the local or state health department.  Wear a facemask You should wear a facemask that covers your nose and mouth when you are in the same room with other people and when you visit a healthcare provider. People who live with or visit you should also wear a facemask while they are in the same room with you.  Separate yourself from other people in your home As much as possible, you should stay in a different room from other people in your home. Also, you should use a separate bathroom, if available.  Avoid sharing household items You should not share dishes, drinking glasses, cups, eating utensils, towels, bedding, or other items with other people in your home. After using these items, you should wash them thoroughly with soap and water.  Cover your coughs and sneezes Cover your mouth and nose with a tissue when you cough or sneeze, or you can cough or sneeze into your sleeve. Throw used tissues in a lined trash can, and immediately wash your hands with soap and water for at least 20 seconds or use an alcohol-based hand rub.  Wash your [facility] your hands often and thoroughly with soap and water for at least 20 seconds. You can use an alcohol-based hand sanitizer if soap and  water are not available and if your hands are not visibly dirty. Avoid touching your eyes, nose, and mouth with unwashed hands.   Prevention Steps for Caregivers and Household Members of Individuals Confirmed to have, or Being Evaluated for, COVID-19 Infection Being Cared for in the Home  If you live with, or provide care at home for, a person confirmed to have, or being evaluated for, COVID-19 infection please follow these guidelines to prevent infection:  Follow healthcare provider's instructions Make sure that you understand and can help the patient follow any healthcare provider instructions for all care.  Provide for the patient's basic needs You should help the patient with basic needs in the home and provide support for getting groceries, prescriptions, and other personal needs.  Monitor the patient's symptoms If they are getting sicker, call his or her medical provider and tell them that the patient has, or is being evaluated for, COVID-19 infection. This will help the healthcare provider's office take steps to keep other people from getting infected. Ask the healthcare provider to call the local or state health department.  Limit the number of people who have contact with the patient  If possible, have only one caregiver for the patient.  Other household members should stay in another home or place of residence. If this is not possible, they should stay  in another room, or  be separated from the patient as much as possible. Use a separate bathroom, if available.  Restrict visitors who do not have an essential need to be in the home.  Keep older adults, very young children, and other sick people away from the patient Keep older adults, very young children, and those who have compromised immune systems or chronic health conditions away from the patient. This includes people with chronic heart, lung, or kidney conditions, diabetes, and cancer.  Ensure good ventilation Make  sure that shared spaces in the home have good air flow, such as from an air conditioner or an opened window, weather permitting.  Wash your hands often  Wash your hands often and thoroughly with soap and water for at least 20 seconds. You can use an alcohol based hand sanitizer if soap and water are not available and if your hands are not visibly dirty.  Avoid touching your eyes, nose, and mouth with unwashed hands.  Use disposable paper towels to dry your hands. If not available, use dedicated cloth towels and replace them when they become wet.  Wear a facemask and gloves  Wear a disposable facemask at all times in the room and gloves when you touch or have contact with the patient's blood, body fluids, and/or secretions or excretions, such as sweat, saliva, sputum, nasal mucus, vomit, urine, or feces.  Ensure the mask fits over your nose and mouth tightly, and do not touch it during use.  Throw out disposable facemasks and gloves after using them. Do not reuse.  Wash your hands immediately after removing your facemask and gloves.  If your personal clothing becomes contaminated, carefully remove clothing and launder. Wash your hands after handling contaminated clothing.  Place all used disposable facemasks, gloves, and other waste in a lined container before disposing them with other household waste.  Remove gloves and wash your hands immediately after handling these items.  Do not share dishes, glasses, or other household items with the patient  Avoid sharing household items. You should not share dishes, drinking glasses, cups, eating utensils, towels, bedding, or other items with a patient who is confirmed to have, or being evaluated for, COVID-19 infection.  After the person uses these items, you should wash them thoroughly with soap and water.  Wash laundry thoroughly  Immediately remove and wash clothes or bedding that have blood, body fluids, and/or secretions or excretions,  such as sweat, saliva, sputum, nasal mucus, vomit, urine, or feces, on them.  Wear gloves when handling laundry from the patient.  Read and follow directions on labels of laundry or clothing items and detergent. In general, wash and dry with the warmest temperatures recommended on the label.  Clean all areas the individual has used often  Clean all touchable surfaces, such as counters, tabletops, doorknobs, bathroom fixtures, toilets, phones, keyboards, tablets, and bedside tables, every day. Also, clean any surfaces that may have blood, body fluids, and/or secretions or excretions on them.  Wear gloves when cleaning surfaces the patient has come in contact with.  Use a diluted bleach solution (e.g., dilute bleach with 1 part bleach and 10 parts water) or a household disinfectant with a label that says EPA-registered for coronaviruses. To make a bleach solution at home, add 1 tablespoon of bleach to 1 quart (4 cups) of water. For a larger supply, add  cup of bleach to 1 gallon (16 cups) of water.  Read labels of cleaning products and follow recommendations provided on product labels. Labels contain instructions for  safe and effective use of the cleaning product including precautions you should take when applying the product, such as wearing gloves or eye protection and making sure you have good ventilation during use of the product.  Remove gloves and wash hands immediately after cleaning.  Monitor yourself for signs and symptoms of illness Caregivers and household members are considered close contacts, should monitor their health, and will be asked to limit movement outside of the home to the extent possible. Follow the monitoring steps for close contacts listed on the symptom monitoring form.   ? If you have additional questions, contact your local health department or call the epidemiologist on call at [phone] (available 24/7). ? This guidance is subject to change. For the most  up-to-date guidance from CDC, please refer to their website: YouBlogs.pl    Activity:  As tolerated with Full fall precautions use walker/cane & assistance as needed   Discharge Instructions    (HEART FAILURE PATIENTS) Call MD:  Anytime you have any of the following symptoms: 1) 3 pound weight gain in 24 hours or 5 pounds in 1 week 2) shortness of breath, with or without a dry hacking cough 3) swelling in the hands, feet or stomach 4) if you have to sleep on extra pillows at night in order to breathe.   Complete by: As directed    Call MD for:  difficulty breathing, headache or visual disturbances   Complete by: As directed    Call MD for:  extreme fatigue   Complete by: As directed    Call MD for:  persistant dizziness or light-headedness   Complete by: As directed    Call MD for:  persistant nausea and vomiting   Complete by: As directed    Diet - low sodium heart healthy   Complete by: As directed    Discharge instructions   Complete by: As directed    1.)  3 weeks of isolation from 06/05/2019   Follow with Primary MD  Hoyt Koch, MD in 1-2 weeks  Please get a complete blood count and chemistry panel checked by your Primary MD at your next visit, and again as instructed by your Primary MD.  Get Medicines reviewed and adjusted: Please take all your medications with you for your next visit with your Primary MD  Laboratory/radiological data: Please request your Primary MD to go over all hospital tests and procedure/radiological results at the follow up, please ask your Primary MD to get all Hospital records sent to his/her office.  In some cases, they will be blood work, cultures and biopsy results pending at the time of your discharge. Please request that your primary care M.D. follows up on these results.  Also Note the following: If you experience worsening of your admission symptoms, develop shortness of  breath, life threatening emergency, suicidal or homicidal thoughts you must seek medical attention immediately by calling 911 or calling your MD immediately  if symptoms less severe.  You must read complete instructions/literature along with all the possible adverse reactions/side effects for all the Medicines you take and that have been prescribed to you. Take any new Medicines after you have completely understood and accpet all the possible adverse reactions/side effects.   Do not drive when taking Pain medications or sleeping medications (Benzodaizepines)  Do not take more than prescribed Pain, Sleep and Anxiety Medications. It is not advisable to combine anxiety,sleep and pain medications without talking with your primary care practitioner  Special Instructions: If you have  smoked or chewed Tobacco  in the last 2 yrs please stop smoking, stop any regular Alcohol  and or any Recreational drug use.  Wear Seat belts while driving.  Please note: You were cared for by a hospitalist during your hospital stay. Once you are discharged, your primary care physician will handle any further medical issues. Please note that NO REFILLS for any discharge medications will be authorized once you are discharged, as it is imperative that you return to your primary care physician (or establish a relationship with a primary care physician if you do not have one) for your post hospital discharge needs so that they can reassess your need for medications and monitor your lab values.   Increase activity slowly   Complete by: As directed      Allergies as of 06/09/2019      Reactions   Methylprednisolone Other (See Comments)   Caused severe GERD in October 2018   Minoxidil Palpitations, Other (See Comments)   unable to sleep   Naproxen Swelling      Medication List    STOP taking these medications   doxycycline 100 MG tablet Commonly known as: VIBRA-TABS     TAKE these medications   amLODipine 5 MG  tablet Commonly known as: NORVASC Take 5 mg by mouth at bedtime.   aspirin EC 81 MG tablet Take 81 mg by mouth daily.   calcitRIOL 0.25 MCG capsule Commonly known as: ROCALTROL Take 0.25 mcg by mouth See admin instructions. Take one capsule (0.25 mcg) by mouth twice weekly - Monday and Friday   CALCIUM CARBONATE ANTACID PO Take 2 tablets by mouth daily as needed for indigestion or heartburn.   carvedilol 25 MG tablet Commonly known as: COREG Take 1 tablet (25 mg total) by mouth 2 (two) times daily with a meal.   feeding supplement (NEPRO CARB STEADY) Liqd Take 237 mLs by mouth 3 (three) times daily as needed (Supplement).   fluticasone 50 MCG/ACT nasal spray Commonly known as: FLONASE Place 2 sprays into both nostrils daily as needed for allergies. What changed: reasons to take this   furosemide 20 MG tablet Commonly known as: LASIX Take two tablets in the morning and take one tablet in the evening. What changed:   how much to take  how to take this  when to take this  additional instructions   gabapentin 300 MG capsule Commonly known as: NEURONTIN TAKE 1 CAPSULE(300 MG) BY MOUTH THREE TIMES DAILY AS NEEDED FOR PAIN What changed:   how much to take  how to take this  when to take this  reasons to take this  additional instructions   hydrocortisone 2.5 % rectal cream Commonly known as: ANUSOL-HC Place 1 application rectally as needed. What changed:   when to take this  reasons to take this   ondansetron 4 MG tablet Commonly known as: Zofran Take 1 tablet (4 mg total) by mouth every 8 (eight) hours as needed for nausea or vomiting.   OXYGEN Inhale 2 L into the lungs as needed (shortness of breath).   potassium chloride SA 20 MEQ tablet Commonly known as: KLOR-CON Take 20 mEq by mouth daily after lunch.   promethazine-dextromethorphan 6.25-15 MG/5ML syrup Commonly known as: PROMETHAZINE-DM Take 5 mLs by mouth 4 (four) times daily as needed for  cough. What changed: when to take this   spironolactone 25 MG tablet Commonly known as: ALDACTONE Take 1 tablet (25 mg total) by mouth daily. What changed: when to take  this   Systane Balance 0.6 % Soln Generic drug: Propylene Glycol Place 1 drop into both eyes daily as needed (for dry eyes).   triamcinolone ointment 0.5 % Commonly known as: KENALOG Apply 1 application topically daily as needed (itching).            Durable Medical Equipment  (From admission, onward)         Start     Ordered   06/08/19 1636  For home use only DME Walker rolling  Once    Question Answer Comment  Walker: With [place] Wheels   Patient needs a walker to treat with the following condition Mobility impaired      06/08/19 1635         Follow-up Information    Hoyt Koch, MD. Schedule an appointment as soon as possible for a visit in 1 week(s).   Specialty: Internal Medicine Contact information: [place] [address] [phone]        Minus Breeding, MD. Schedule an appointment as soon as possible for a visit in 2 week(s).   Specialty: Cardiology Contact information: [address] [place] [address] [phone]          Allergies  Allergen Reactions  . Methylprednisolone Other (See Comments)    Caused severe GERD in October 2018  . Minoxidil Palpitations and Other (See Comments)    unable to sleep  . Naproxen Swelling    Consultations:   None   Other Procedures/Studies: NM Pulmonary Perfusion  Result Date: 06/05/2019 CLINICAL DATA:  Hypoxemia. Pulmonary hypertension. [place] 3 kidney disease. EXAM: NUCLEAR MEDICINE PERFUSION LUNG SCAN TECHNIQUE: Perfusion images were obtained in multiple projections after intravenous injection of radiopharmaceutical. Ventilation scans intentionally deferred if perfusion scan and chest x-ray adequate for interpretation during COVID 19 epidemic. RADIOPHARMACEUTICALS:  1.5 mCi Tc-16m MAA  IV COMPARISON:  Chest radiograph 06/05/2019 FINDINGS: Posterolateral left lower lung decreased perfusion, at the site of airspace disease on 06/05/2019 plain film. Otherwise, no wedge-shaped defects to suggest acute pulmonary embolism. IMPRESSION: No findings to suggest pulmonary embolism. Electronically Signed   By: Abigail Miyamoto M.D.   On: 06/05/2019 16:33   DG Chest Port 1 View  Result Date: 06/05/2019 CLINICAL DATA:  Difficulty breathing for 1 week. EXAM: PORTABLE CHEST 1 VIEW COMPARISON:  07/15/2018 FINDINGS: 0733 hours. The cardio pericardial silhouette is enlarged. There is pulmonary vascular congestion without overt pulmonary edema. New right parahilar airspace disease noted with similar retrocardiac chronic atelectasis or scarring. The visualized bony structures of the thorax are intact. Telemetry leads overlie the chest. IMPRESSION: 1. New right parahilar airspace disease compatible with pneumonia. 2. Stable retrocardiac atelectasis or scarring. 3. Pulmonary vascular congestion without overt pulmonary edema. Electronically Signed   By: Misty Stanley M.D.   On: 06/05/2019 07:55   VAS [place] LOWER EXTREMITY VENOUS (DVT)  Result Date: 06/06/2019  Lower Venous DVTStudy Indications: Covid-19, elevated D-Dimer.  Limitations: Poor ultrasound/tissue interface and edema. Comparison Study: Prior study from 07/15/18 is available for comparison Performing Technologist: Sharion Dove RVS  Examination Guidelines: A complete evaluation includes B-mode imaging, spectral Doppler, color Doppler, and power Doppler as needed of all accessible portions of each vessel. Bilateral testing is considered an integral part of a complete examination. Limited examinations for reoccurring indications may be performed as noted. The reflux portion of the exam is performed with the patient in reverse Trendelenburg.  +---------+---------------+---------+-----------+----------+--------------+ RIGHT     CompressibilityPhasicitySpontaneityPropertiesThrombus Aging +---------+---------------+---------+-----------+----------+--------------+ CFV  Full           Yes      Yes                                 +---------+---------------+---------+-----------+----------+--------------+ SFJ      Full                                                        +---------+---------------+---------+-----------+----------+--------------+ FV Prox  Full                                                        +---------+---------------+---------+-----------+----------+--------------+ FV Mid   Full                                                        +---------+---------------+---------+-----------+----------+--------------+ FV DistalFull                                                        +---------+---------------+---------+-----------+----------+--------------+ PFV      Full                                                        +---------+---------------+---------+-----------+----------+--------------+ POP      Full           Yes      Yes                                 +---------+---------------+---------+-----------+----------+--------------+ PTV      Full                                                        +---------+---------------+---------+-----------+----------+--------------+ PERO     Full                                                        +---------+---------------+---------+-----------+----------+--------------+   +---------+---------------+---------+-----------+----------+--------------+ LEFT     CompressibilityPhasicitySpontaneityPropertiesThrombus Aging +---------+---------------+---------+-----------+----------+--------------+ CFV      Full           Yes      Yes                                 +---------+---------------+---------+-----------+----------+--------------+  SFJ      Full                                                         +---------+---------------+---------+-----------+----------+--------------+ FV Prox  Full                                                        +---------+---------------+---------+-----------+----------+--------------+ FV Mid   Full                                                        +---------+---------------+---------+-----------+----------+--------------+ FV DistalFull                                                        +---------+---------------+---------+-----------+----------+--------------+ PFV      Full                                                        +---------+---------------+---------+-----------+----------+--------------+ POP      Full           Yes      Yes                                 +---------+---------------+---------+-----------+----------+--------------+ PTV      Full                                                        +---------+---------------+---------+-----------+----------+--------------+ PERO     Full                                                        +---------+---------------+---------+-----------+----------+--------------+     Summary: BILATERAL: - No evidence of deep vein thrombosis seen in the lower extremities, bilaterally.  RIGHT: - Findings appear improved from previous examination.  LEFT: - Findings appear essentially unchanged compared to previous examination.  *See table(s) above for measurements and observations. Electronically signed by Servando Snare MD on 06/06/2019 at 12:45:27 PM.    Final    ECHOCARDIOGRAM LIMITED  Result Date: 06/05/2019    ECHOCARDIOGRAM LIMITED REPORT   Patient Name:   [name] Date of Exam: 06/05/2019 Medical Rec #:  [id number]     Height:       65.0 in Accession #:  [id number]    Weight:       172.0 lb Date of Birth:  06-14-1945      BSA:          1.855 m Patient Age:    20 years      BP:           155/84 mmHg Patient Gender: F             HR:           94  bpm. Exam Location:  Inpatient Procedure: Limited Echo, Cardiac Doppler and Limited Color Doppler Indications:    dyspnea 786.09  History:        Patient has prior history of Echocardiogram examinations, most                 recent 05/30/2017. CHF, Covid. chronic kidney disease; Risk                 Factors:Hypertension.                 Aortic Valve: bioprosthetic valve is present in the aortic                 position.  Sonographer:    Johny Chess Referring Phys: [place]  1. Left ventricular ejection fraction, by estimation, is 50 to 55%. The left ventricle has low normal function. The left ventricle has no regional wall motion abnormalities. There is severe concentric left ventricular hypertrophy. There is hypokinesis of the left ventricular, inferior wall.  2. Right ventricular systolic function is normal. The right ventricular size is normal. Tricuspid regurgitation signal is inadequate for assessing PA pressure.  3. The mitral valve is normal in structure and function. Mild to moderate mitral valve regurgitation. No evidence of mitral stenosis.  4. The aortic valve has been repaired/replaced. There is a bioprosthetic valve present in the aortic position. Aortic valve mean gradient measures 15.5 mmHg. Aortic valve peak gradient measures 30.4 mmHg. Aortic valve area, by VTI measures 1.09 cm. There is mild perivalvular AI.  5. S/P aortic root repair. Aortic dilatation noted. There is mild dilatation of the aortic root measuring 41 mm.  6. The inferior vena cava is dilated in size with >50% respiratory variability, suggesting right atrial pressure of 8 mmHg. FINDINGS  Left Ventricle: Left ventricular ejection fraction, by estimation, is 50--55%. The left ventricle has low normal function. The left ventricle has no regional wall motion abnormalities. The left ventricular internal cavity size was normal in size. There is severe concentric left ventricular hypertrophy. Abnormal  (paradoxical) septal motion, consistent with left bundle branch block. Right Ventricle: The right ventricular size is normal. No increase in right ventricular wall thickness. Right ventricular systolic function is normal. Tricuspid regurgitation signal is inadequate for assessing PA pressure. Left Atrium: Left atrial size was normal in size. Right Atrium: Right atrial size was normal in size. Pericardium: There is no evidence of pericardial effusion. Mitral Valve: The mitral valve is normal in structure and function. Normal mobility of the mitral valve leaflets. Mild to moderate mitral valve regurgitation. No evidence of mitral valve stenosis. Tricuspid Valve: The tricuspid valve is normal in structure. Tricuspid valve regurgitation is mild . No evidence of tricuspid stenosis. Aortic Valve: There is mild perivalvular AI. The aortic valve has been repaired/replaced. Aortic valve mean gradient measures 15.5 mmHg. Aortic valve peak gradient measures 30.4 mmHg. Aortic valve area, by VTI measures 1.09 cm. There is a bioprosthetic valve present in  the aortic position. Pulmonic Valve: The pulmonic valve was normal in structure. Pulmonic valve regurgitation is mild. No evidence of pulmonic stenosis. Aorta: Aortic dilatation noted. There is mild dilatation of the aortic root measuring 41 mm. Venous: The inferior vena cava is dilated in size with greater than 50% respiratory variability, suggesting right atrial pressure of 8 mmHg. IAS/Shunts: No atrial level shunt detected by color flow Doppler.  LEFT VENTRICLE PLAX 2D LVIDd:         4.40 cm LVIDs:         3.20 cm LV PW:         2.40 cm LV IVS:        2.00 cm LVOT diam:     1.80 cm LV SV:         50 LV SV Index:   27 LVOT Area:     2.54 cm  LEFT ATRIUM         Index LA diam:    3.50 cm 1.89 cm/m  AORTIC VALVE AV Area (Vmax):    1.09 cm AV Area (Vmean):   0.97 cm AV Area (VTI):     1.09 cm AV Vmax:           275.50 cm/s AV Vmean:          181.000 cm/s AV VTI:             0.460 m AV Peak Grad:      30.4 mmHg AV Mean Grad:      15.5 mmHg LVOT Vmax:         118.00 cm/s LVOT Vmean:        68.800 cm/s LVOT VTI:          0.198 m LVOT/AV VTI ratio: 0.43  AORTA Ao Root diam: 4.10 cm  SHUNTS Systemic VTI:  0.20 m Systemic Diam: 1.80 cm Fransico Him MD Electronically signed by Fransico Him MD Signature Date/Time: 06/05/2019/6:50:15 PM    Final      TODAY-DAY OF DISCHARGE:  Subjective:   [name] today has no headache,no chest abdominal pain,no new weakness tingling or numbness, feels much better wants to go home today.   Objective:   Blood pressure (!) 165/96, pulse 67, temperature 98 F (36.7 C), temperature source Oral, resp. rate 17, height 5\' 5"  (1.651 m), weight 84.6 kg, SpO2 99 %.  Intake/Output Summary (Last 24 hours) at 06/09/2019 1055 Last data filed at 06/09/2019 9562 Gross per 24 hour  Intake 1306 ml  Output 1001 ml  Net 305 ml   Filed Weights   06/05/19 0703 06/08/19 0500 06/09/19 0538  Weight: 78 kg 84.4 kg 84.6 kg    Exam: Awake Alert, Oriented *3, No new F.N deficits, Normal affect .AT,PERRAL Supple Neck,No JVD, No cervical lymphadenopathy appriciated.  Symmetrical Chest wall movement, Good air movement bilaterally, CTAB RRR,No Gallops,Rubs or new Murmurs, No Parasternal Heave +ve B.Sounds, Abd Soft, Non tender, No organomegaly appriciated, No rebound -guarding or rigidity. No Cyanosis, Clubbing or edema, No new Rash or bruise   PERTINENT RADIOLOGIC STUDIES: NM Pulmonary Perfusion  Result Date: 06/05/2019 CLINICAL DATA:  Hypoxemia. Pulmonary hypertension. [place] 3 kidney disease. EXAM: NUCLEAR MEDICINE PERFUSION LUNG SCAN TECHNIQUE: Perfusion images were obtained in multiple projections after intravenous injection of radiopharmaceutical. Ventilation scans intentionally deferred if perfusion scan and chest x-ray adequate for interpretation during COVID 19 epidemic. RADIOPHARMACEUTICALS:  1.5 mCi Tc-76m MAA IV COMPARISON:  Chest  radiograph 06/05/2019 FINDINGS: Posterolateral left lower lung decreased perfusion, at the  site of airspace disease on 06/05/2019 plain film. Otherwise, no wedge-shaped defects to suggest acute pulmonary embolism. IMPRESSION: No findings to suggest pulmonary embolism. Electronically Signed   By: Abigail Miyamoto M.D.   On: 06/05/2019 16:33   DG Chest Port 1 View  Result Date: 06/05/2019 CLINICAL DATA:  Difficulty breathing for 1 week. EXAM: PORTABLE CHEST 1 VIEW COMPARISON:  07/15/2018 FINDINGS: 0733 hours. The cardio pericardial silhouette is enlarged. There is pulmonary vascular congestion without overt pulmonary edema. New right parahilar airspace disease noted with similar retrocardiac chronic atelectasis or scarring. The visualized bony structures of the thorax are intact. Telemetry leads overlie the chest. IMPRESSION: 1. New right parahilar airspace disease compatible with pneumonia. 2. Stable retrocardiac atelectasis or scarring. 3. Pulmonary vascular congestion without overt pulmonary edema. Electronically Signed   By: Misty Stanley M.D.   On: 06/05/2019 07:55   VAS [place] LOWER EXTREMITY VENOUS (DVT)  Result Date: 06/06/2019  Lower Venous DVTStudy Indications: Covid-19, elevated D-Dimer.  Limitations: Poor ultrasound/tissue interface and edema. Comparison Study: Prior study from 07/15/18 is available for comparison Performing Technologist: Sharion Dove RVS  Examination Guidelines: A complete evaluation includes B-mode imaging, spectral Doppler, color Doppler, and power Doppler as needed of all accessible portions of each vessel. Bilateral testing is considered an integral part of a complete examination. Limited examinations for reoccurring indications may be performed as noted. The reflux portion of the exam is performed with the patient in reverse Trendelenburg.  +---------+---------------+---------+-----------+----------+--------------+ RIGHT    CompressibilityPhasicitySpontaneityPropertiesThrombus  Aging +---------+---------------+---------+-----------+----------+--------------+ CFV      Full           Yes      Yes                                 +---------+---------------+---------+-----------+----------+--------------+ SFJ      Full                                                        +---------+---------------+---------+-----------+----------+--------------+ FV Prox  Full                                                        +---------+---------------+---------+-----------+----------+--------------+ FV Mid   Full                                                        +---------+---------------+---------+-----------+----------+--------------+ FV DistalFull                                                        +---------+---------------+---------+-----------+----------+--------------+ PFV      Full                                                        +---------+---------------+---------+-----------+----------+--------------+  POP      Full           Yes      Yes                                 +---------+---------------+---------+-----------+----------+--------------+ PTV      Full                                                        +---------+---------------+---------+-----------+----------+--------------+ PERO     Full                                                        +---------+---------------+---------+-----------+----------+--------------+   +---------+---------------+---------+-----------+----------+--------------+ LEFT     CompressibilityPhasicitySpontaneityPropertiesThrombus Aging +---------+---------------+---------+-----------+----------+--------------+ CFV      Full           Yes      Yes                                 +---------+---------------+---------+-----------+----------+--------------+ SFJ      Full                                                         +---------+---------------+---------+-----------+----------+--------------+ FV Prox  Full                                                        +---------+---------------+---------+-----------+----------+--------------+ FV Mid   Full                                                        +---------+---------------+---------+-----------+----------+--------------+ FV DistalFull                                                        +---------+---------------+---------+-----------+----------+--------------+ PFV      Full                                                        +---------+---------------+---------+-----------+----------+--------------+ POP      Full           Yes      Yes                                 +---------+---------------+---------+-----------+----------+--------------+  PTV      Full                                                        +---------+---------------+---------+-----------+----------+--------------+ PERO     Full                                                        +---------+---------------+---------+-----------+----------+--------------+     Summary: BILATERAL: - No evidence of deep vein thrombosis seen in the lower extremities, bilaterally.  RIGHT: - Findings appear improved from previous examination.  LEFT: - Findings appear essentially unchanged compared to previous examination.  *See table(s) above for measurements and observations. Electronically signed by Servando Snare MD on 06/06/2019 at 12:45:27 PM.    Final    ECHOCARDIOGRAM LIMITED  Result Date: 06/05/2019    ECHOCARDIOGRAM LIMITED REPORT   Patient Name:   [name] Date of Exam: 06/05/2019 Medical Rec #:  [id number]     Height:       65.0 in Accession #:    [id number]    Weight:       172.0 lb Date of Birth:  May 14, 1945      BSA:          1.855 m Patient Age:    78 years      BP:           155/84 mmHg Patient Gender: F             HR:           94 bpm. Exam Location:   Inpatient Procedure: Limited Echo, Cardiac Doppler and Limited Color Doppler Indications:    dyspnea 786.09  History:        Patient has prior history of Echocardiogram examinations, most                 recent 05/30/2017. CHF, Covid. chronic kidney disease; Risk                 Factors:Hypertension.                 Aortic Valve: bioprosthetic valve is present in the aortic                 position.  Sonographer:    Johny Chess Referring Phys: [place]  1. Left ventricular ejection fraction, by estimation, is 50 to 55%. The left ventricle has low normal function. The left ventricle has no regional wall motion abnormalities. There is severe concentric left ventricular hypertrophy. There is hypokinesis of the left ventricular, inferior wall.  2. Right ventricular systolic function is normal. The right ventricular size is normal. Tricuspid regurgitation signal is inadequate for assessing PA pressure.  3. The mitral valve is normal in structure and function. Mild to moderate mitral valve regurgitation. No evidence of mitral stenosis.  4. The aortic valve has been repaired/replaced. There is a bioprosthetic valve present in the aortic position. Aortic valve mean gradient measures 15.5 mmHg. Aortic valve peak gradient measures 30.4 mmHg. Aortic valve area, by VTI measures 1.09 cm. There is mild perivalvular AI.  5. S/P aortic root repair. Aortic dilatation  noted. There is mild dilatation of the aortic root measuring 41 mm.  6. The inferior vena cava is dilated in size with >50% respiratory variability, suggesting right atrial pressure of 8 mmHg. FINDINGS  Left Ventricle: Left ventricular ejection fraction, by estimation, is 50--55%. The left ventricle has low normal function. The left ventricle has no regional wall motion abnormalities. The left ventricular internal cavity size was normal in size. There is severe concentric left ventricular hypertrophy. Abnormal (paradoxical) septal motion,  consistent with left bundle branch block. Right Ventricle: The right ventricular size is normal. No increase in right ventricular wall thickness. Right ventricular systolic function is normal. Tricuspid regurgitation signal is inadequate for assessing PA pressure. Left Atrium: Left atrial size was normal in size. Right Atrium: Right atrial size was normal in size. Pericardium: There is no evidence of pericardial effusion. Mitral Valve: The mitral valve is normal in structure and function. Normal mobility of the mitral valve leaflets. Mild to moderate mitral valve regurgitation. No evidence of mitral valve stenosis. Tricuspid Valve: The tricuspid valve is normal in structure. Tricuspid valve regurgitation is mild . No evidence of tricuspid stenosis. Aortic Valve: There is mild perivalvular AI. The aortic valve has been repaired/replaced. Aortic valve mean gradient measures 15.5 mmHg. Aortic valve peak gradient measures 30.4 mmHg. Aortic valve area, by VTI measures 1.09 cm. There is a bioprosthetic valve present in the aortic position. Pulmonic Valve: The pulmonic valve was normal in structure. Pulmonic valve regurgitation is mild. No evidence of pulmonic stenosis. Aorta: Aortic dilatation noted. There is mild dilatation of the aortic root measuring 41 mm. Venous: The inferior vena cava is dilated in size with greater than 50% respiratory variability, suggesting right atrial pressure of 8 mmHg. IAS/Shunts: No atrial level shunt detected by color flow Doppler.  LEFT VENTRICLE PLAX 2D LVIDd:         4.40 cm LVIDs:         3.20 cm LV PW:         2.40 cm LV IVS:        2.00 cm LVOT diam:     1.80 cm LV SV:         50 LV SV Index:   27 LVOT Area:     2.54 cm  LEFT ATRIUM         Index LA diam:    3.50 cm 1.89 cm/m  AORTIC VALVE AV Area (Vmax):    1.09 cm AV Area (Vmean):   0.97 cm AV Area (VTI):     1.09 cm AV Vmax:           275.50 cm/s AV Vmean:          181.000 cm/s AV VTI:            0.460 m AV Peak Grad:       30.4 mmHg AV Mean Grad:      15.5 mmHg LVOT Vmax:         118.00 cm/s LVOT Vmean:        68.800 cm/s LVOT VTI:          0.198 m LVOT/AV VTI ratio: 0.43  AORTA Ao Root diam: 4.10 cm  SHUNTS Systemic VTI:  0.20 m Systemic Diam: 1.80 cm Fransico Him MD Electronically signed by Fransico Him MD Signature Date/Time: 06/05/2019/6:50:15 PM    Final      PERTINENT LAB RESULTS: CBC: Recent Labs    06/08/19 0658 06/09/19 0340  WBC 9.3 10.6*  HGB 9.6*  10.3*  HCT 30.6* 32.9*  PLT 168 166   CMET CMP     Component Value Date/Time   NA 135 06/09/2019 0340   NA 142 01/13/2019 1518   NA 144 03/05/2017 1238   K 4.0 06/09/2019 0340   K 3.9 03/05/2017 1238   CL 99 06/09/2019 0340   CO2 24 06/09/2019 0340   CO2 26 03/05/2017 1238   GLUCOSE 118 (H) 06/09/2019 0340   GLUCOSE 97 03/05/2017 1238   BUN 92 (H) 06/09/2019 0340   BUN 87 (HH) 01/13/2019 1518   BUN 37.5 (H) 03/05/2017 1238   CREATININE 2.49 (H) 06/09/2019 0340   CREATININE 3.1 (HH) 03/05/2017 1238   CALCIUM 8.9 06/09/2019 0340   CALCIUM 10.1 03/05/2017 1238   PROT 6.3 (L) 06/09/2019 0340   PROT 6.5 04/24/2017 1356   PROT 7.3 03/05/2017 1238   ALBUMIN 2.9 (L) 06/09/2019 0340   ALBUMIN 3.1 (L) 03/05/2017 1238   AST 24 06/09/2019 0340   AST 30 03/05/2017 1238   ALT 23 06/09/2019 0340   ALT 26 03/05/2017 1238   ALKPHOS 70 06/09/2019 0340   ALKPHOS 78 03/05/2017 1238   BILITOT 0.6 06/09/2019 0340   BILITOT 0.51 03/05/2017 1238   GFRNONAA 19 (L) 06/09/2019 0340   GFRAA 21 (L) 06/09/2019 0340    GFR Estimated Creatinine Clearance: 21.6 mL/min (A) (by C-G formula based on SCr of 2.49 mg/dL (H)). No results for input(s): LIPASE, AMYLASE in the last 72 hours. No results for input(s): CKTOTAL, CKMB, CKMBINDEX, TROPONINI in the last 72 hours. Invalid input(s): POCBNP Recent Labs    06/08/19 0658 06/09/19 0340  DDIMER 3.61* 3.18*   No results for input(s): HGBA1C in the last 72 hours. No results for input(s): CHOL, HDL, LDLCALC,  TRIG, CHOLHDL, LDLDIRECT in the last 72 hours. No results for input(s): TSH, T4TOTAL, T3FREE, THYROIDAB in the last 72 hours.  Invalid input(s): FREET3 Recent Labs    06/08/19 0658 06/09/19 0340  FERRITIN 1,019* 1,053*   Coags: No results for input(s): INR in the last 72 hours.  Invalid input(s): PT Microbiology: Recent Results (from the past 240 hour(s))  Respiratory Panel by RT PCR (Flu A&B, Covid) - Nasopharyngeal Swab     Status: Abnormal   Collection Time: 06/05/19  7:25 AM   Specimen: Nasopharyngeal Swab  Result Value Ref Range Status   SARS Coronavirus 2 by RT PCR POSITIVE (A) NEGATIVE Final    Comment: RESULT CALLED TO, READ BACK BY AND VERIFIED WITH: N KOONTZ ON [id number] AT 0907 BY NFIELDS (NOTE) SARS-CoV-2 target nucleic acids are DETECTED. SARS-CoV-2 RNA is generally detectable in upper respiratory specimens  during the acute phase of infection. Positive results are indicative of the presence of the identified virus, but do not rule out bacterial infection or co-infection with other pathogens not detected by the test. Clinical correlation with patient history and other diagnostic information is necessary to determine patient infection status. The expected result is Negative. Fact Sheet for Patients:  PinkCheek.be Fact Sheet for Healthcare Providers: GravelBags.it This test is not yet approved or cleared by the [place] FDA and  has been authorized for detection and/or diagnosis of SARS-CoV-2 by FDA under an Emergency Use Authorization (EUA).  This EUA will remain in effect (meaning this test can be used)  for the duration of  the COVID-19 declaration under Section 564(b)(1) of the Act, 21 U.S.C. section 360bbb-3(b)(1), unless the authorization is terminated or revoked sooner.    Influenza A by PCR NEGATIVE  NEGATIVE Final   Influenza B by PCR NEGATIVE NEGATIVE Final    Comment: (NOTE) The Xpert Xpress  SARS-CoV-2/FLU/RSV assay is intended as an aid in  the diagnosis of influenza from Nasopharyngeal swab specimens and  should not be used as a sole basis for treatment. Nasal washings and  aspirates are unacceptable for Xpert Xpress SARS-CoV-2/FLU/RSV  testing. Fact Sheet for Patients: PinkCheek.be Fact Sheet for Healthcare Providers: GravelBags.it This test is not yet approved or cleared by the [place] FDA and  has been authorized for detection and/or diagnosis of SARS-CoV-2 by  FDA under an Emergency Use Authorization (EUA). This EUA will remain  in effect (meaning this test can be used) for the duration of the  Covid-19 declaration under Section 564(b)(1) of the Act, 21  U.S.C. section 360bbb-3(b)(1), unless the authorization is  terminated or revoked. Performed at [facility], [place] [address], [place], [place] [address]     FURTHER DISCHARGE INSTRUCTIONS:  Get Medicines reviewed and adjusted: Please take all your medications with you for your next visit with your Primary MD  Laboratory/radiological data: Please request your Primary MD to go over all hospital tests and procedure/radiological results at the follow up, please ask your Primary MD to get all Hospital records sent to his/her office.  In some cases, they will be blood work, cultures and biopsy results pending at the time of your discharge. Please request that your primary care M.D. goes through all the records of your hospital data and follows up on these results.  Also Note the following: If you experience worsening of your admission symptoms, develop shortness of breath, life threatening emergency, suicidal or homicidal thoughts you must seek medical attention immediately by calling 911 or calling your MD immediately  if symptoms less severe.  You must read complete instructions/literature along with all the possible adverse reactions/side effects  for all the Medicines you take and that have been prescribed to you. Take any new Medicines after you have completely understood and accpet all the possible adverse reactions/side effects.   Do not drive when taking Pain medications or sleeping medications (Benzodaizepines)  Do not take more than prescribed Pain, Sleep and Anxiety Medications. It is not advisable to combine anxiety,sleep and pain medications without talking with your primary care practitioner  Special Instructions: If you have smoked or chewed Tobacco  in the last 2 yrs please stop smoking, stop any regular Alcohol  and or any Recreational drug use.  Wear Seat belts while driving.  Please note: You were cared for by a hospitalist during your hospital stay. Once you are discharged, your primary care physician will handle any further medical issues. Please note that NO REFILLS for any discharge medications will be authorized once you are discharged, as it is imperative that you return to your primary care physician (or establish a relationship with a primary care physician if you do not have one) for your post hospital discharge needs so that they can reassess your need for medications and monitor your lab values.  Total Time spent coordinating discharge including counseling, education and face to face time equals 35 minutes.  SignedOren Binet 06/09/2019 10:55 AM

## 2019-06-09 NOTE — Plan of Care (Signed)
  Problem: Clinical Measurements: Goal: Respiratory complications will improve Outcome: Progressing   

## 2019-06-09 NOTE — Care Management Important Message (Signed)
Important Message  Patient Details  Name: Lenyx Boody MRN: [id number] Date of Birth: 1945/11/19   Medicare Important Message Given:  Yes - Important Message mailed due to current National Emergency  Verbal consent obtained due to current National Emergency  Relationship to patient: Self Contact Name: Farran Amsden Call Date: 06/09/19  Time: 1103 Phone: [phone] Outcome: Spoke with contact Important Message mailed to: Patient address on file    [place] 06/09/2019, 11:03 AM

## 2019-06-10 ENCOUNTER — Telehealth: Payer: Self-pay | Admitting: *Deleted

## 2019-06-10 NOTE — Telephone Encounter (Signed)
Transition Care Management Follow-up Telephone Call   Date discharged? 06/09/19   How have you been since you were released from the hospital? Pt states she is ok.. she's able to breathe   Do you understand why you were in the hospital? YES   Do you understand the discharge instructions? YES   Where were you discharged to? HOME   Items Reviewed:  Medications reviewed: YES, she states her medications still the same  Allergies reviewed: YES  Dietary changes reviewed: YES  Referrals reviewed: No referral needed   Functional Questionnaire:   Activities of Daily Living (ADLs):   She states she are independent in the following: bathing and hygiene, feeding, continence, grooming, toileting and dressing States she require assistance with the following: ambulation she states her legs feels heavy and give out sometimes. She uses an walker   Any transportation issues/concerns?: NO   Any patient concerns? NO   Confirmed importance and date/time of follow-up visits scheduled YES, (Telephone) 06/17/19 due to pt not able to do virtual  Provider Appointment booked with Dr. Sharlet Salina  Confirmed with patient if condition begins to worsen call PCP or go to the ER.  Patient was given the office number and encouraged to call back with question or concerns.  : YES

## 2019-06-12 IMAGING — DX DG CHEST 1V PORT
1 series · 1 of 1 positions shown · non-contrast
Comparison: CT 05/28/2017.  Chest x-ray 05/22/2017.

CLINICAL DATA: Shortness of breath.

EXAM:
PORTABLE CHEST 1 VIEW

[chest ap]
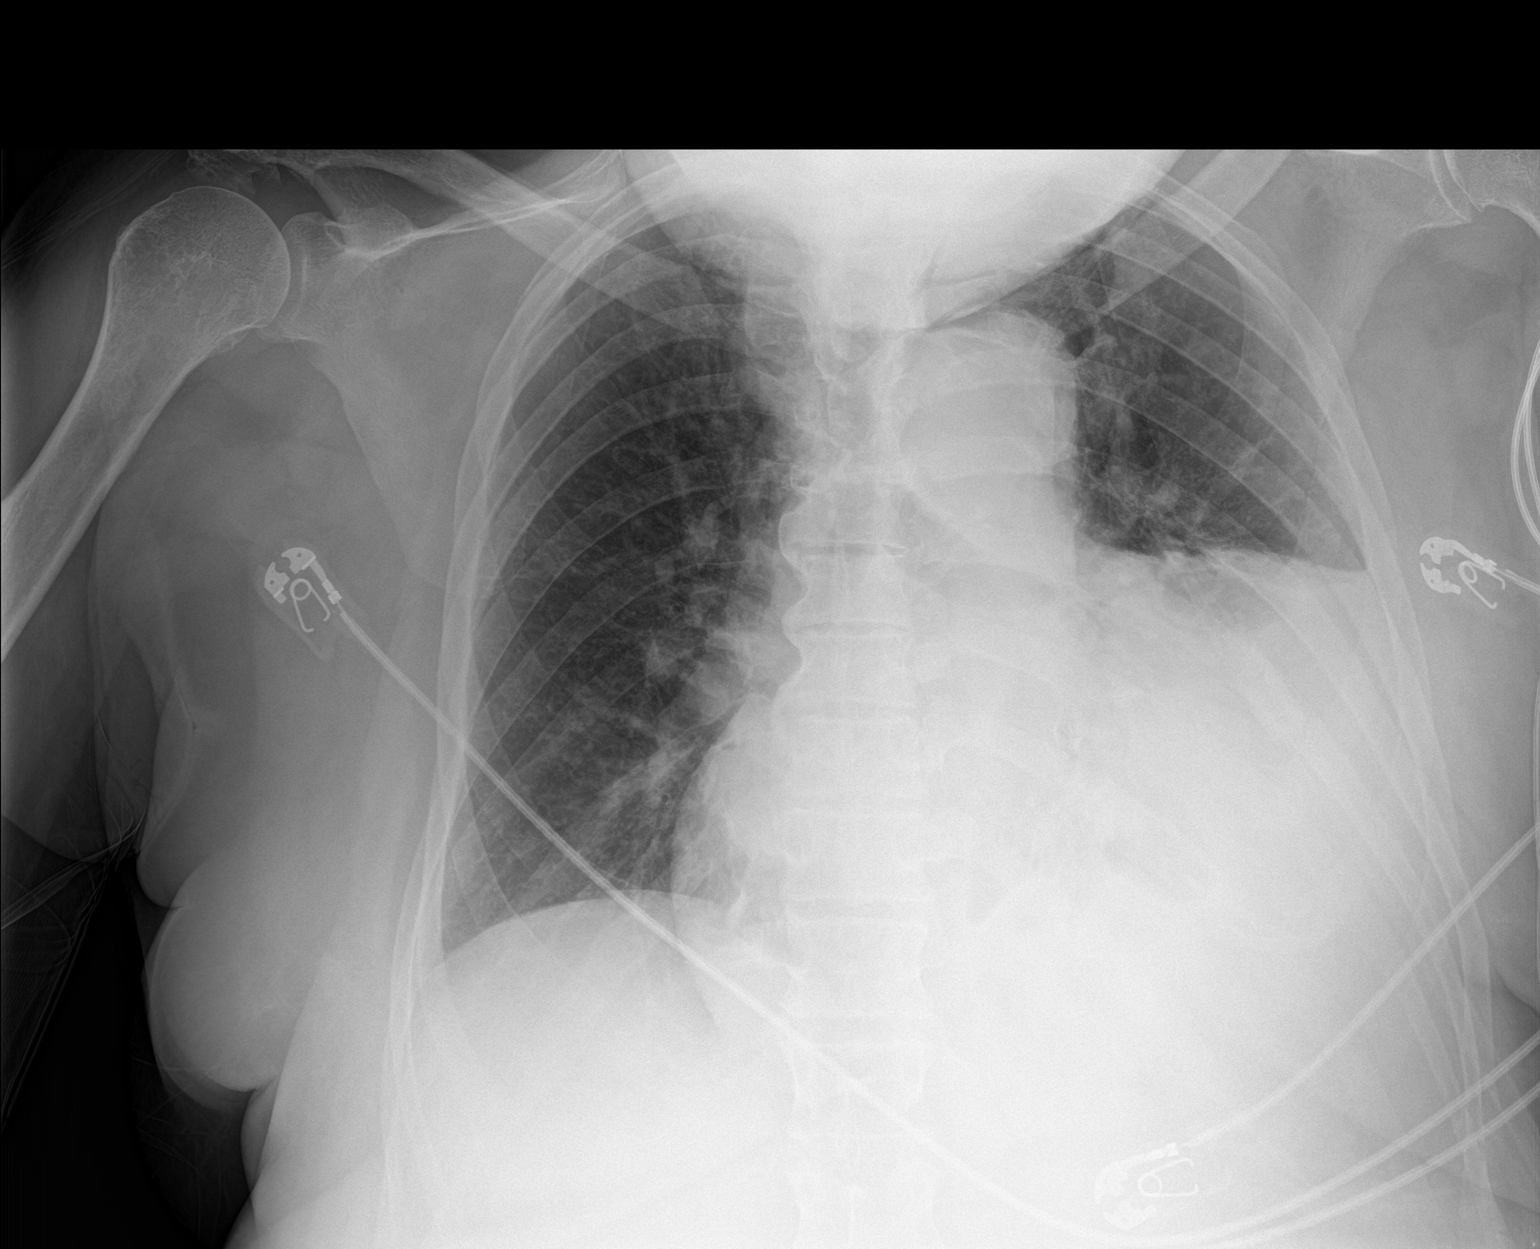

[1 of 1 positions shown; findings below may reference images not displayed]

FINDINGS: Stable severe cardiomegaly. Persistent dense
atelectasis/consolidation lingula/left lung base. Right lung is
clear. No pleural effusion or pneumothorax. No acute bony
abnormality.
IMPRESSION: 1.  Stable severe cardiomegaly.

2. Persistent dense atelectasis/consolidation lingula/left lung
base. No interim change.

## 2019-06-17 ENCOUNTER — Encounter: Payer: Self-pay | Admitting: Internal Medicine

## 2019-06-17 ENCOUNTER — Ambulatory Visit (INDEPENDENT_AMBULATORY_CARE_PROVIDER_SITE_OTHER): Payer: Medicare Other | Admitting: Internal Medicine

## 2019-06-17 DIAGNOSIS — N184 Chronic kidney disease, stage 4 (severe): Secondary | ICD-10-CM | POA: Diagnosis not present

## 2019-06-17 DIAGNOSIS — J069 Acute upper respiratory infection, unspecified: Secondary | ICD-10-CM

## 2019-06-17 DIAGNOSIS — U071 COVID-19: Secondary | ICD-10-CM

## 2019-06-17 MED ORDER — PROMETHAZINE-DM 6.25-15 MG/5ML PO SYRP: 5.0000 mL | ORAL_SOLUTION | Freq: Four times a day (QID) | ORAL | 1 refills | Status: DC | PRN

## 2019-06-17 NOTE — Assessment & Plan Note (Signed)
Will recheck labs when in the office. Stable during hospital stay.

## 2019-06-17 NOTE — Progress Notes (Signed)
Virtual Visit via Audio Note  I connected with [name] on 06/17/19 at 10:40 AM EST by a video enabled telemedicine application and verified that I am speaking with the correct person using two identifiers.  The patient and the provider were at separate locations throughout the entire encounter.   I discussed the limitations of evaluation and management by telemedicine and the availability of in person appointments. The patient expressed understanding and agreed to proceed. The patient and the provider were the only parties present for the visit unless noted in HPI below.  History of Present Illness: The patient is a 74 y.o. female with visit for covid-19 respiratory failure. Did end up hospitalized for 5 days and had remdisivir and needed 2L oxygen at one time.She was discharged on room air. Still under quarantine. Started feeling bad a week or two before with cough. Has no fevers or chills currently. Denies SOB. Overall it is improving. Still some cough. Has tried gradually increasing activity. Had low magnesium in the hospital and this was replaced. Isolation ends 06/26/19  Observations/Objective: A and O times 3, minimal cough during visit and no dyspnea during visit, voice strong  Assessment and Plan: See problem oriented charting  Follow Up Instructions: come into office in about 1 month for chest x-ray, Covid-19 vaccine first week in April  Visit time 13 minutes in face to face communication with patient and coordination of care.  I discussed the assessment and treatment plan with the patient. The patient was provided an opportunity to ask questions and all were answered. The patient agreed with the plan and demonstrated an understanding of the instructions.   The patient was advised to call back or seek an in-person evaluation if the symptoms worsen or if the condition fails to improve as anticipated.  Hoyt Koch, MD

## 2019-06-17 NOTE — Assessment & Plan Note (Signed)
Appears to be improving and advised to continue with gradual exercise increase. Visit in 1 month for CXR.

## 2019-06-19 DIAGNOSIS — I5033 Acute on chronic diastolic (congestive) heart failure: Secondary | ICD-10-CM | POA: Diagnosis not present

## 2019-06-19 DIAGNOSIS — I5030 Unspecified diastolic (congestive) heart failure: Secondary | ICD-10-CM | POA: Diagnosis not present

## 2019-06-30 IMAGING — CR DG CHEST 2V
2 series · 2 of 2 positions shown · non-contrast
Comparison: 06/12/2017

CLINICAL DATA: Pleural effusion.

EXAM:
CHEST - 2 VIEW

[chest lat]
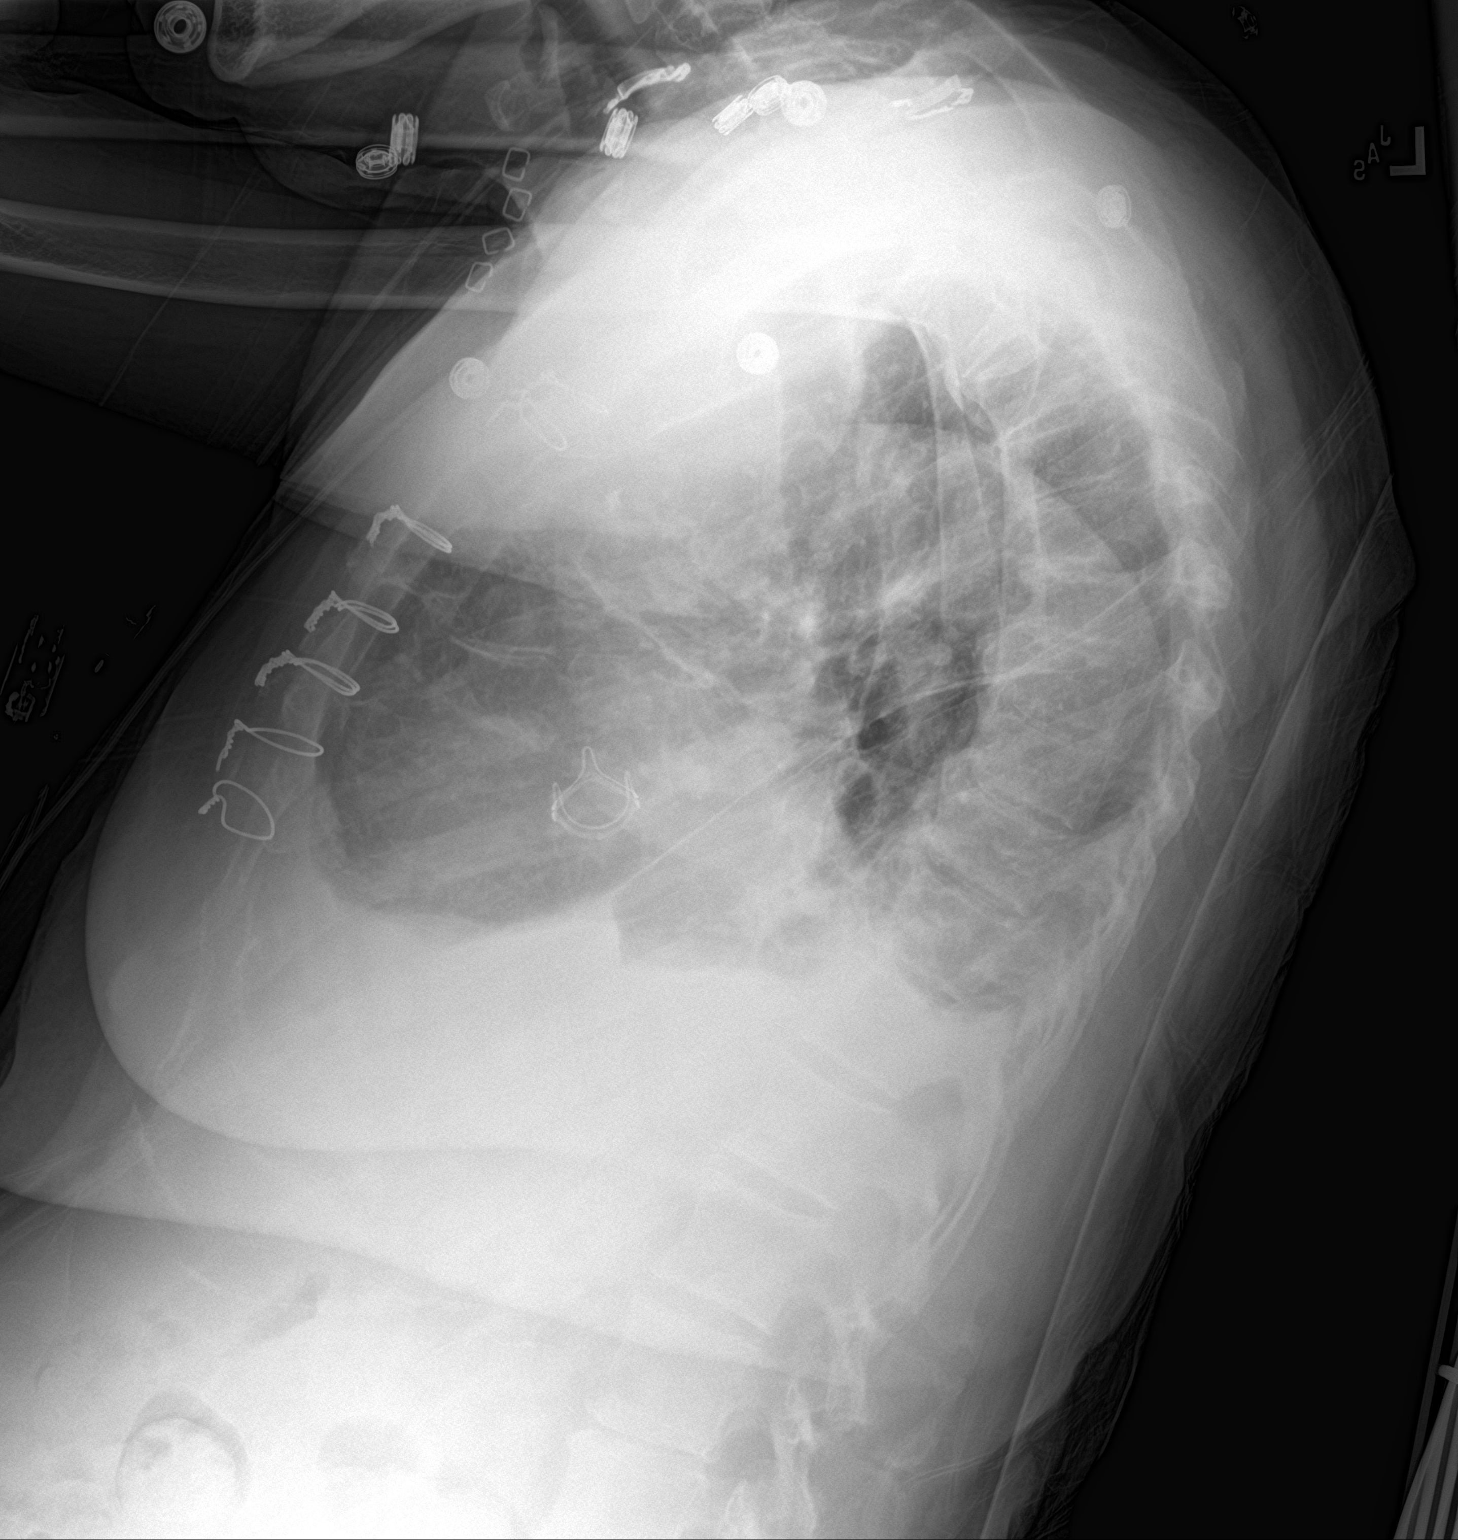

[chest ap]
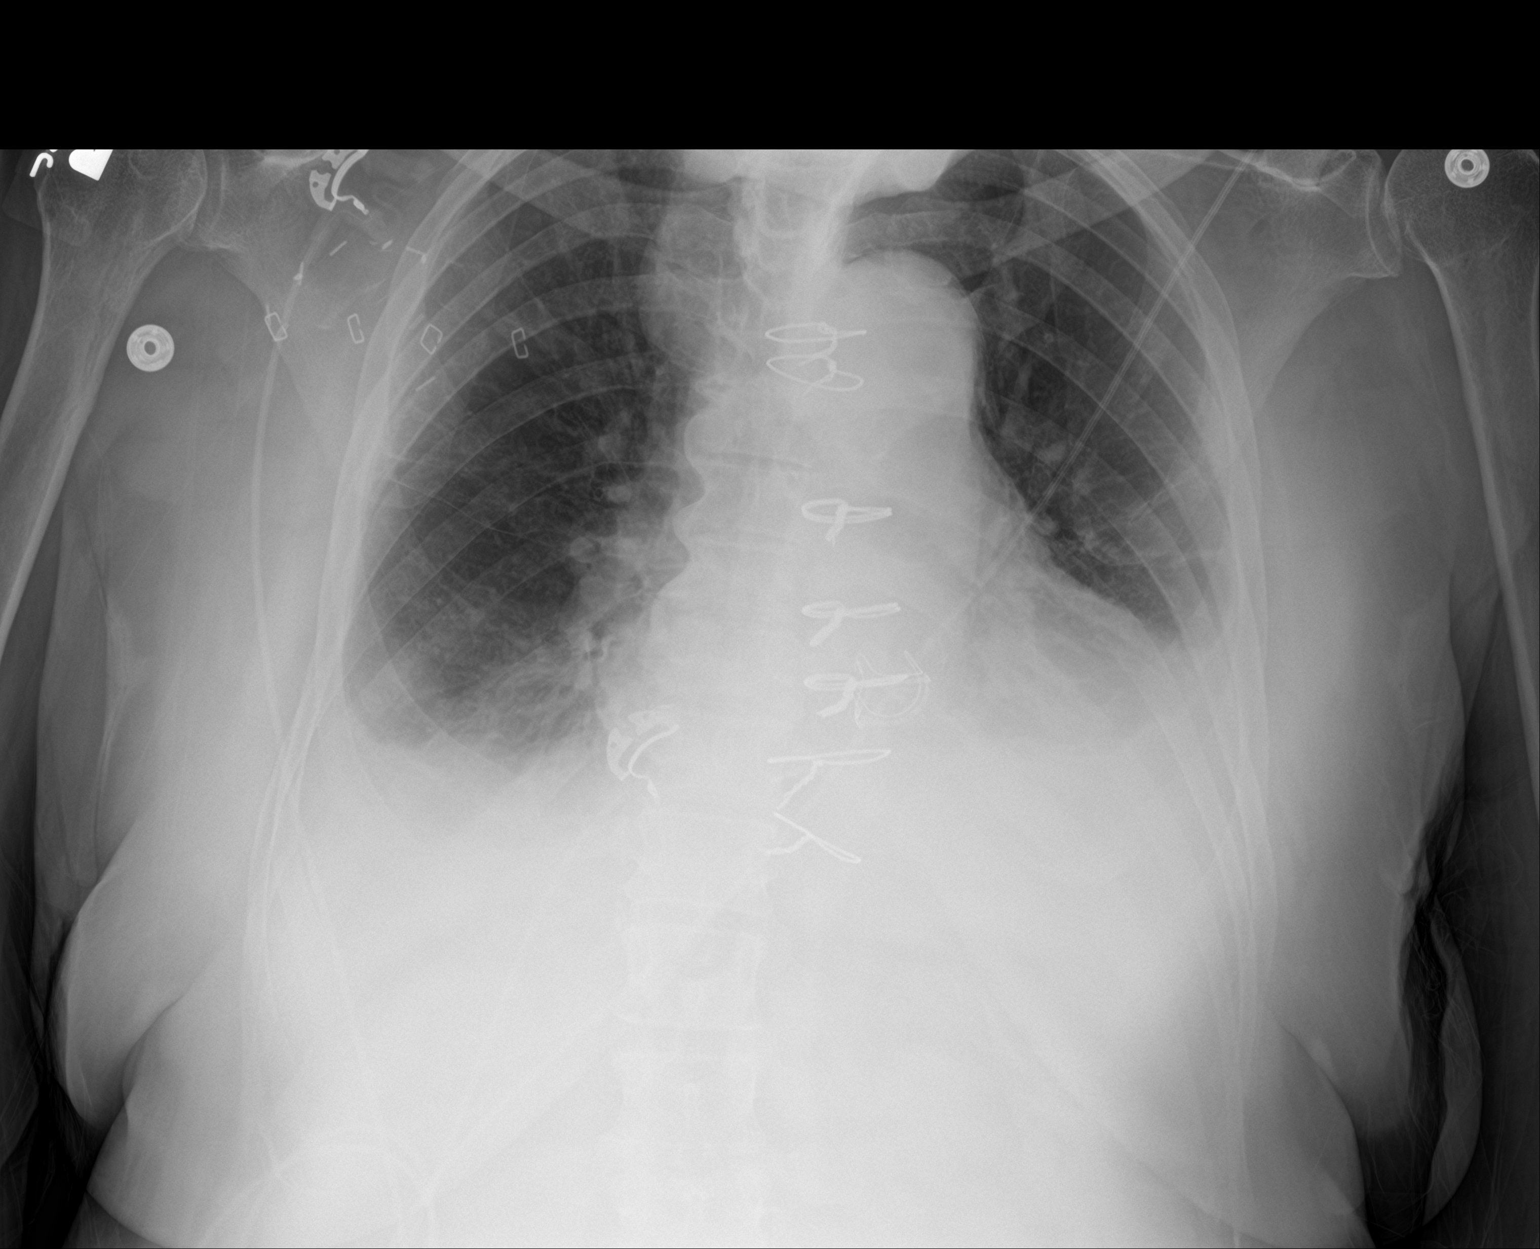

[2 of 2 positions shown; findings below may reference images not displayed]

FINDINGS: Sequelae of aortic valve replacement are again identified. The
cardiac silhouette remains mildly enlarged. Small pleural effusions,
left larger than right, are stable to slightly decreased in size.
Left greater than right basilar airspace opacities have also
improved. No pneumothorax is identified.
IMPRESSION: Mildly improved aeration of the lung bases with stable to slightly
decreased size of small pleural effusions.

## 2019-07-14 ENCOUNTER — Other Ambulatory Visit (HOSPITAL_COMMUNITY): Payer: Medicare Other

## 2019-07-20 DIAGNOSIS — I5033 Acute on chronic diastolic (congestive) heart failure: Secondary | ICD-10-CM | POA: Diagnosis not present

## 2019-07-20 DIAGNOSIS — I5030 Unspecified diastolic (congestive) heart failure: Secondary | ICD-10-CM | POA: Diagnosis not present

## 2019-07-22 ENCOUNTER — Ambulatory Visit: Payer: Medicare Other | Admitting: Internal Medicine

## 2019-07-26 ENCOUNTER — Telehealth: Payer: Self-pay

## 2019-07-26 NOTE — Telephone Encounter (Signed)
New message    The patient calling C/o right hand swollen she has been soaking it in Epsom salt taken Tylenol.  Offer to R/s appt that she has on Friday, April 23rd to an open appt today with MD patient voiced she unable to come today, wanted a message sent around.  Any suggestion would be helpful  The patient wants to keep Friday appt

## 2019-07-26 NOTE — Telephone Encounter (Signed)
I cannot really give advice without knowing circumstances.

## 2019-07-26 NOTE — Telephone Encounter (Signed)
See message.

## 2019-07-27 NOTE — Telephone Encounter (Signed)
Called lvm for call back to office

## 2019-07-28 NOTE — Progress Notes (Signed)
[name] [address] [name] Phone: [phone] Subjective:    I'm seeing this patient by the request  of:  Hoyt Koch, MD   I, [name], am serving as a scribe for Dr. Lynne Leader.  CC: Knee  Pain bilaterally  [id number]   04/05/2020 Bilateral injections given today.  Feels like the viscosupplementation previously did not help.  We discussed icing regimen and home exercises, patient is mostly wheelchair-bound.  Discussed strengthening.  Patient does not want to go to formal physical therapy secondary to coronavirus outbreak.  Follow-up with me again in 8 to 10 weeks for further injections  Update 07/29/2019 [name] is a 74 y.o. female coming in with complaint of bilateral knee pain.  Since last visit patient states L knee is hurting worse than the R states the cortisone injections did help the last time.  Patient since we have seen patient was in the hospital with Covid.  Feels like she is making progress.  Denies any worsening shortness of breath.    Past Medical History:  Diagnosis Date  . Acute pancreatitis 02/04/2017   [name] 02/04/2017  . Adenomatous colon polyp 01/2002  . Anemia, chronic disease    /notes 02/04/2017  . Aortic valve disease    AI/AS  . Arthritis    "lower back, knees" (02/04/2017)  . CHF (congestive heart failure) ([place])   . CKD (chronic kidney disease), stage III   . Diverticulosis of colon   . DJD (degenerative joint disease)   . Frequent UTI   . Hemorrhoids   . Hypertension   . Lumbar back pain   . Neuropathy   . Obesity   . Pericardial effusion     in a patient with Diastolic heart  failure and Pericardial effusion  known since last 2 D echo in 11/2016 /notes 02/04/2017  . PONV (postoperative nausea and vomiting)   . Pulmonary HTN ([place])   . Renal cyst   . Venous insufficiency   . Vitamin D deficiency    Past Surgical History:  Procedure Laterality Date  .  ABDOMINAL HYSTERECTOMY     "partial"  . AORTIC VALVE REPLACEMENT N/A 05/30/2017   Procedure: AORTIC VALVE REPLACEMENT (AVR);  Surgeon: Prescott Gum, Collier Salina, MD;  Location: [facility];  Service: Vascular;  Laterality: N/A;  . COLONOSCOPY W/ BIOPSIES AND POLYPECTOMY     "bx was ok"  . IR THORACENTESIS ASP PLEURAL SPACE W/IMG GUIDE  06/11/2017  . IR THORACENTESIS ASP PLEURAL SPACE W/IMG GUIDE  06/12/2017  . REPAIR OF ACUTE ASCENDING THORACIC AORTIC DISSECTION N/A 05/30/2017   Procedure: REPLACEMENT OF ASCENDING AORTIC ANEURYSM AND REPAIRED CHRONIC ROOT DISSECTION WITH CIRC ARREST;  Surgeon: Ivin Poot, MD;  Location: [facility];  Service: Vascular;  Laterality: N/A;  . TEE WITHOUT CARDIOVERSION N/A 05/28/2017   Procedure: TRANSESOPHAGEAL ECHOCARDIOGRAM (TEE);  Surgeon: Skeet Latch, MD;  Location: [facility];  Service: Cardiovascular;  Laterality: N/A;  . TEE WITHOUT CARDIOVERSION N/A 05/30/2017   Procedure: TRANSESOPHAGEAL ECHOCARDIOGRAM (TEE);  Surgeon: Prescott Gum, Collier Salina, MD;  Location: [facility];  Service: Open Heart Surgery;  Laterality: N/A;   Social History   Socioeconomic History  . Marital status: Married    Spouse name: [name]  . Number of children: 1  . Years of education: 71  . Highest education level: Not on file  Occupational History  . Occupation: retired from Administrator, arts  Tobacco Use  . Smoking status: Former Smoker  Packs/day: 0.40    Years: 15.00    Pack years: 6.00    Types: Cigarettes    Quit date: 04/08/1982    Years since quitting: 37.3  . Smokeless tobacco: Never Used  Substance and Sexual Activity  . Alcohol use: No  . Drug use: No  . Sexual activity: Never  Other Topics Concern  . Not on file  Social History Narrative   Lives at home w/ her husband   Right-handed   Daily caffeine    Social Determinants of Health   Financial Resource Strain:   . Difficulty of Paying Living Expenses:   Food Insecurity:   . Worried About Charity fundraiser in the Last Year:     . Arboriculturist in the Last Year:   Transportation Needs:   . Film/video editor (Medical):   Marland Kitchen Lack of Transportation (Non-Medical):   Physical Activity:   . Days of Exercise per Week:   . Minutes of Exercise per Session:   Stress:   . Feeling of Stress :   Social Connections:   . Frequency of Communication with Friends and Family:   . Frequency of Social Gatherings with Friends and Family:   . Attends Religious Services:   . Active Member of Clubs or Organizations:   . Attends Archivist Meetings:   Marland Kitchen Marital Status:    Allergies  Allergen Reactions  . Methylprednisolone Other (See Comments)    Caused severe GERD in October 2018  . Minoxidil Palpitations and Other (See Comments)    unable to sleep  . Naproxen Swelling   Family History  Problem Relation Age of Onset  . Diabetes Mother   . Colon polyps Mother   . Hypertension Mother   . Hypertension Brother   . Hypertension Sister   . Colon cancer Neg Hx     Current Outpatient Medications (Endocrine & Metabolic):  .  calcitRIOL (ROCALTROL) 0.25 MCG capsule, Take 0.25 mcg by mouth See admin instructions. Take one capsule (0.25 mcg) by mouth twice weekly - Monday and Friday  Current Outpatient Medications (Cardiovascular):  .  amLODipine (NORVASC) 5 MG tablet, Take 5 mg by mouth at bedtime. .  carvedilol (COREG) 25 MG tablet, Take 1 tablet (25 mg total) by mouth 2 (two) times daily with a meal. .  furosemide (LASIX) 20 MG tablet, Take two tablets in the morning and take one tablet in the evening. (Patient taking differently: Take 20-40 mg by mouth See admin instructions. Take 2 tablets (40 mg) by mouth every morning and 1 tablet (20 mg) every evening - 4-5pm) .  spironolactone (ALDACTONE) 25 MG tablet, Take 1 tablet (25 mg total) by mouth daily. (Patient taking differently: Take 25 mg by mouth 2 (two) times a week. )  Current Outpatient Medications (Respiratory):  .  fluticasone (FLONASE) 50 MCG/ACT nasal  spray, Place 2 sprays into both nostrils daily as needed for allergies. (Patient taking differently: Place 2 sprays into both nostrils daily as needed for allergies (congestion). ) .  promethazine-dextromethorphan (PROMETHAZINE-DM) 6.25-15 MG/5ML syrup, Take 5 mLs by mouth 4 (four) times daily as needed for cough.  Current Outpatient Medications (Analgesics):  .  aspirin EC 81 MG tablet, Take 81 mg by mouth daily.   Current Outpatient Medications (Other):  Marland Kitchen  CALCIUM CARBONATE ANTACID PO, Take 2 tablets by mouth daily as needed for indigestion or heartburn.  .  gabapentin (NEURONTIN) 300 MG capsule, TAKE 1 CAPSULE(300 MG) BY MOUTH THREE TIMES  DAILY AS NEEDED FOR PAIN (Patient taking differently: Take 300 mg by mouth at bedtime as needed (leg pain). ) .  hydrocortisone (ANUSOL-HC) 2.5 % rectal cream, Place 1 application rectally as needed. (Patient taking differently: Place 1 application rectally daily as needed for hemorrhoids or anal itching. ) .  ondansetron (ZOFRAN) 4 MG tablet, Take 1 tablet (4 mg total) by mouth every 8 (eight) hours as needed for nausea or vomiting. .  OXYGEN, Inhale 2 L into the lungs as needed (shortness of breath). .  potassium chloride SA (K-DUR,KLOR-CON) 20 MEQ tablet, Take 20 mEq by mouth daily after lunch.  .  Propylene Glycol (SYSTANE BALANCE) 0.6 % SOLN, Place 1 drop into both eyes daily as needed (for dry eyes). .  triamcinolone ointment (KENALOG) 0.5 %, Apply 1 application topically daily as needed (itching).   Reviewed prior external information including notes and imaging from  primary care provider As well as notes that were available from care everywhere and other healthcare systems.  Past medical history, social, surgical and family history all reviewed in electronic medical record.  No pertanent information unless stated regarding to the chief complaint.   Review of Systems:  No headache, visual changes, nausea, vomiting, diarrhea, constipation,  dizziness, abdominal pain, skin rash, fevers, chills, night sweats, weight loss, swollen lymph nodes,  chest pain, shortness of breath, mood changes. POSITIVE muscle aches, body aches, joint swelling  Objective  Blood pressure (!) 158/90, pulse 85, height 5\' 5"  (1.651 m), weight 172 lb (78 kg), SpO2 97 %.   General: No apparent distress alert and oriented x3 mood and affect normal, dressed appropriately.  HEENT: Pupils equal, extraocular movements intact  .  Gait in a wheel chair  MSK: Knee: Bilateral valgus deformity noted. Large thigh to calf ratio.  Tender to palpation over medial and PF joint line.  ROM full in flexion and extension and lower leg rotation. instability with valgus force.  painful patellar compression. Patellar glide with moderate crepitus. Patellar and quadriceps tendons unremarkable. Hamstring and quadriceps strength is normal.  After informed written and verbal consent, patient was seated on exam table. Right knee was prepped with alcohol swab and utilizing anterolateral approach, patient's right knee space was injected with 4:1  marcaine 0.5%: Kenalog 40mg /dL. Patient tolerated the procedure well without immediate complications.  After informed written and verbal consent, patient was seated on exam table. Left knee was prepped with alcohol swab and utilizing anterolateral approach, patient's left knee space was injected with 4:1  marcaine 0.5%: Kenalog 40mg /dL. Patient tolerated the procedure well without immediate complications.     Impression and Recommendations:     This case required medical decision making of moderate complexity. The above documentation has been reviewed and is accurate and complete Lyndal Pulley, DO       Note: This dictation was prepared with Dragon dictation along with smaller phrase technology. Any transcriptional errors that result from this process are unintentional.

## 2019-07-29 ENCOUNTER — Encounter: Payer: Self-pay | Admitting: Internal Medicine

## 2019-07-29 ENCOUNTER — Ambulatory Visit (INDEPENDENT_AMBULATORY_CARE_PROVIDER_SITE_OTHER): Payer: Medicare Other | Admitting: Family Medicine

## 2019-07-29 ENCOUNTER — Other Ambulatory Visit: Payer: Self-pay | Admitting: Internal Medicine

## 2019-07-29 ENCOUNTER — Ambulatory Visit (INDEPENDENT_AMBULATORY_CARE_PROVIDER_SITE_OTHER): Payer: Medicare Other | Admitting: Internal Medicine

## 2019-07-29 ENCOUNTER — Encounter: Payer: Self-pay | Admitting: Family Medicine

## 2019-07-29 ENCOUNTER — Other Ambulatory Visit: Payer: Self-pay

## 2019-07-29 ENCOUNTER — Ambulatory Visit (INDEPENDENT_AMBULATORY_CARE_PROVIDER_SITE_OTHER): Payer: Medicare Other

## 2019-07-29 VITALS — BP 162/74 | HR 60 | Temp 98.0°F | Ht 65.0 in

## 2019-07-29 DIAGNOSIS — J069 Acute upper respiratory infection, unspecified: Secondary | ICD-10-CM

## 2019-07-29 DIAGNOSIS — I5032 Chronic diastolic (congestive) heart failure: Secondary | ICD-10-CM | POA: Diagnosis not present

## 2019-07-29 DIAGNOSIS — I272 Pulmonary hypertension, unspecified: Secondary | ICD-10-CM

## 2019-07-29 DIAGNOSIS — U071 COVID-19: Secondary | ICD-10-CM | POA: Diagnosis not present

## 2019-07-29 DIAGNOSIS — M170 Bilateral primary osteoarthritis of knee: Secondary | ICD-10-CM | POA: Diagnosis not present

## 2019-07-29 DIAGNOSIS — I10 Essential (primary) hypertension: Secondary | ICD-10-CM | POA: Diagnosis not present

## 2019-07-29 DIAGNOSIS — N184 Chronic kidney disease, stage 4 (severe): Secondary | ICD-10-CM

## 2019-07-29 LAB — CBC
HCT: 25.4 % — ABNORMAL LOW (ref 36.0–46.0)
Hemoglobin: 8.4 g/dL — ABNORMAL LOW (ref 12.0–15.0)
MCHC: 33.3 g/dL (ref 30.0–36.0)
MCV: 92.1 fl (ref 78.0–100.0)
Platelets: 287 10*3/uL (ref 150.0–400.0)
RBC: 2.75 Mil/uL — ABNORMAL LOW (ref 3.87–5.11)
RDW: 16.9 % — ABNORMAL HIGH (ref 11.5–15.5)
WBC: 8 10*3/uL (ref 4.0–10.5)

## 2019-07-29 LAB — COMPREHENSIVE METABOLIC PANEL
ALT: 12 U/L (ref 0–35)
AST: 19 U/L (ref 0–37)
Albumin: 3.6 g/dL (ref 3.5–5.2)
Alkaline Phosphatase: 75 U/L (ref 39–117)
BUN: 53 mg/dL — ABNORMAL HIGH (ref 6–23)
CO2: 27 mEq/L (ref 19–32)
Calcium: 9.6 mg/dL (ref 8.4–10.5)
Chloride: 104 mEq/L (ref 96–112)
Creatinine, Ser: 2.37 mg/dL — ABNORMAL HIGH (ref 0.40–1.20)
GFR: 24.24 mL/min — ABNORMAL LOW (ref >60.00)
Glucose, Bld: 98 mg/dL (ref 70–99)
Potassium: 3.7 mEq/L (ref 3.5–5.1)
Sodium: 140 mEq/L (ref 135–145)
Total Bilirubin: 0.4 mg/dL (ref 0.2–1.2)
Total Protein: 7.6 g/dL (ref 6.0–8.3)

## 2019-07-29 LAB — BRAIN NATRIURETIC PEPTIDE: Pro B Natriuretic peptide (BNP): 1517 pg/mL — ABNORMAL HIGH (ref 0.0–100.0)

## 2019-07-29 NOTE — Assessment & Plan Note (Signed)
Severe osteoarthritic changes of the knees bilaterally.  Secondary to social determinants of health and patient's other comorbidities including congestive heart failure and chronic kidney disease patient is not a surgical candidate I believe at this time.  Patient hopefully will continue to improve with the steroid injections.  Due to the other complicating factors of her health unable to do anti-inflammatories.  Discussed potential viscosupplementation.  Continue home exercises otherwise.  Topical anti-inflammatories.  Follow-up again 8 to 10 weeks

## 2019-07-29 NOTE — Assessment & Plan Note (Signed)
Checking CXR for resolution of pna today. Symptomatically doing better with still with impact to ADLs.

## 2019-07-29 NOTE — Patient Instructions (Signed)
It is okay to get the covid-19 now.  We are checking the labs and the chest x-ray today.

## 2019-07-29 NOTE — Assessment & Plan Note (Signed)
BP mildly elevated but before meds today. Home readings at goal. Checking CMP and adjust lasix, spironolactone, coreg, amlodipine as needed.

## 2019-07-29 NOTE — Assessment & Plan Note (Signed)
Checking CBC and CMP today.

## 2019-07-29 NOTE — Patient Instructions (Signed)
Good to see you.  pennsaid pinkie amount topically 2 times daily as needed.  Tart cherry extract 1200mg  at night See me again in 8-10 weeks

## 2019-07-29 NOTE — Progress Notes (Signed)
   Subjective:   Patient ID: [name], female    DOB: May 13, 1945, 74 y.o.   MRN: [id number]  HPI The patient is a 74 YO female coming in for follow up of her covid-19 disease. She is due for chest x-ray today to ensure resolution as well as labs today. Her activity level is not improving as much as she would like. She is still coughing intermittently but less. More with activity. She denies weight change or fluid build up. Denies chest pains. Denies abdominal symptoms. Having right hand pain and swelling. Saw Dr. Tamala Julian today and being treated for gout with tart cherry otc as this is resolving. Started a week ago and pain and swelling going down at this time.   Review of Systems  Constitutional: Positive for activity change and fatigue.  HENT: Negative.   Eyes: Negative.   Respiratory: Positive for shortness of breath. Negative for cough and chest tightness.   Cardiovascular: Negative for chest pain, palpitations and leg swelling.  Gastrointestinal: Negative for abdominal distention, abdominal pain, constipation, diarrhea, nausea and vomiting.  Musculoskeletal: Positive for arthralgias, gait problem, joint swelling and myalgias.  Skin: Negative.   Neurological: Negative for dizziness, light-headedness and numbness.  Psychiatric/Behavioral: Negative.     Objective:  Physical Exam Constitutional:      Appearance: She is well-developed. She is obese.  HENT:     Head: Normocephalic and atraumatic.  Cardiovascular:     Rate and Rhythm: Normal rate and regular rhythm.  Pulmonary:     Effort: Pulmonary effort is normal. No respiratory distress.     Breath sounds: Normal breath sounds. No wheezing or rales.  Abdominal:     General: Bowel sounds are normal. There is no distension.     Palpations: Abdomen is soft.     Tenderness: There is no abdominal tenderness. There is no rebound.  Musculoskeletal:        General: Swelling and tenderness present.     Cervical back: Normal range of  motion.     Comments: Right hand tender and swollen with 1+ pitting edema  Skin:    General: Skin is warm and dry.  Neurological:     Mental Status: She is alert and oriented to person, place, and time.     Coordination: Coordination abnormal.     Comments: Wheelchair for most distances currently     Vitals:   07/29/19 1003  BP: (!) 162/74  Pulse: 60  Temp: 98 F (36.7 C)  SpO2: 96%  Height: 5\' 5"  (1.651 m)    This visit occurred during the SARS-CoV-2 public health emergency.  Safety protocols were in place, including screening questions prior to the visit, additional usage of staff PPE, and extensive cleaning of exam room while observing appropriate contact time as indicated for disinfecting solutions.   Assessment & Plan:

## 2019-07-29 NOTE — Assessment & Plan Note (Signed)
Checking renal function. BP elevated but has not taken meds today due to visit here. Past readings at goal.

## 2019-07-29 NOTE — Assessment & Plan Note (Signed)
Checking BNP and adjust as needed. No overt signs of volume overload on exam today.

## 2019-07-30 ENCOUNTER — Ambulatory Visit: Payer: Medicare Other | Admitting: Internal Medicine

## 2019-08-09 ENCOUNTER — Telehealth: Payer: Self-pay | Admitting: Physician Assistant

## 2019-08-09 NOTE — Telephone Encounter (Signed)
I connected by phone with [name] and/or patient's caregiver on 08/09/2019 at 3:29 PM to discuss the potential vaccination through our Homebound vaccination initiative.   Prevaccination Checklist for COVID-19 Vaccines  1.  Are you feeling sick today? no  2.  Have you ever received a dose of a COVID-19 vaccine?  no      If yes, which one? N/A   3.  Have you ever had an allergic reaction: (This would include a severe reaction [ e.g., anaphylaxis] that required treatment with epinephrine or EpiPen or that caused you to go to the hospital.  It would also include an allergic reaction that occurred within 4 hours that caused hives, swelling, or respiratory distress, including wheezing.) A.  A previous dose of COVID-19 vaccine. no  B.  A vaccine or injectable therapy that contains multiple components, one of which is a COVID-19 vaccine component, but it is not known which component elicited the immediate reaction. no  C.  Are you allergic to polyethylene glycol? no   4.  Have you ever had an allergic reaction to another vaccine (other than COVID-19 vaccine) or an injectable medication? (This would include a severe reaction [ e.g., anaphylaxis] that required treatment with epinephrine or EpiPen or that caused you to go to the hospital.  It would also include an allergic reaction that occurred within 4 hours that caused hives, swelling, or respiratory distress, including wheezing.)  no   5.  Have you ever had a severe allergic reaction (e.g., anaphylaxis) to something other than a component of the COVID-19 vaccine, or any vaccine or injectable medication?  This would include food, pet, venom, environmental, or oral medication allergies.  no   6.  Have you received any vaccine in the last 14 days? no   7.  Have you ever had a positive test for COVID-19 or has a doctor ever told you that you had COVID-19?  yes , >45 days ago  8.  Have you received passive antibody therapy (monoclonal antibodies or  convalescent serum) as a treatment for COVID-19? no   9.  Do you have a weakened immune system caused by something such as HIV infection or cancer or do you take immunosuppressive drugs or therapies?  no   10.  Do you have a bleeding disorder or are you taking a blood thinner? no   11.  Are you pregnant or breast-feeding? no   12.  Do you have dermal fillers? no   __________________   This patient is a 74 y.o. female that meets the FDA criteria to receive homebound vaccination. Patient or parent/caregiver understands they have the option to accept or refuse homebound vaccination.  Patient passed the pre-screening checklist and would like to proceed with homebound vaccination.  Based on questionnaire above, I recommend the patient be observed for 15 minutes.  There are no other household members/caregivers who are also interested in receiving the vaccine. Her husband is already vaccinated.    I will send the patient's information to our scheduling team who will reach out to schedule the patient and potential caregiver/family members for homebound vaccination.    Angelena Form PA-C  08/09/2019 3:29 PM

## 2019-08-12 NOTE — Progress Notes (Signed)
Cardiology Office Note   Date:  08/13/2019   ID:  [name], DOB 14-Aug-1945, MRN [id number]  PCP:  Hoyt Koch, MD  Cardiologist:   Minus Breeding, MD   Chief Complaint  Patient presents with  . Shortness of Breath      History of Present Illness: [name] is a 74 y.o. female who presents for evaluation of repaired aortic dissection.  She is status post bioprosthetic AVR and replacement of the ascending thoracic aorta.  She has stage IV CKD and chronic diastolic HF with pleural effusions and edema. She was last in the hospital in February for treatment of COVID 19 pneumonia.  I reviewed all of these hospitalizations for this visit.   Echo at that time demonstrated an EF of 50 - 55%.  She had severe concentric LVH.  The root and aortic valve were stable.    She comes in today and she is having some increased dyspnea.  She says it comes and goes and she was told this is what to expect post Covid.  I do see that she had a chest x-ray in April 22 which showed there was resolution of pneumonia and was some small right pleural effusion.  She did have a BNP that was 1517.  It was suggested that she might increase her Lasix but she did not want to do this until being seen.  She does get short of breath episodically.  She is not describing new PND or orthopnea.  Her oxygen saturation at home is in the mid 90s and today it is 94% at rest.  She does have some increased leg swelling but she is not able to weigh herself daily.  She is not describing chest pressure, neck discomfort or arm discomfort.  She had no palpitations, presyncope or syncope.  She has had no new cough fevers or chills.  Past Medical History:  Diagnosis Date  . Acute pancreatitis 02/04/2017   [name] 02/04/2017  . Adenomatous colon polyp 01/2002  . Anemia, chronic disease    /notes 02/04/2017  . Aortic valve disease    AI/AS  . Arthritis    "lower back, knees" (02/04/2017)  . CHF (congestive heart failure)  ([place])   . CKD (chronic kidney disease), stage III   . Diverticulosis of colon   . DJD (degenerative joint disease)   . Frequent UTI   . Hemorrhoids   . Hypertension   . Lumbar back pain   . Neuropathy   . Obesity   . Pericardial effusion     in a patient with Diastolic heart  failure and Pericardial effusion  known since last 2 D echo in 11/2016 /notes 02/04/2017  . PONV (postoperative nausea and vomiting)   . Pulmonary HTN ([place])   . Renal cyst   . Venous insufficiency   . Vitamin D deficiency     Past Surgical History:  Procedure Laterality Date  . ABDOMINAL HYSTERECTOMY     "partial"  . AORTIC VALVE REPLACEMENT N/A 05/30/2017   Procedure: AORTIC VALVE REPLACEMENT (AVR);  Surgeon: Prescott Gum, Collier Salina, MD;  Location: [facility];  Service: Vascular;  Laterality: N/A;  . COLONOSCOPY W/ BIOPSIES AND POLYPECTOMY     "bx was ok"  . IR THORACENTESIS ASP PLEURAL SPACE W/IMG GUIDE  06/11/2017  . IR THORACENTESIS ASP PLEURAL SPACE W/IMG GUIDE  06/12/2017  . REPAIR OF ACUTE ASCENDING THORACIC AORTIC DISSECTION N/A 05/30/2017   Procedure: REPLACEMENT OF ASCENDING AORTIC ANEURYSM AND REPAIRED CHRONIC ROOT DISSECTION  WITH CIRC ARREST;  Surgeon: Prescott Gum, Collier Salina, MD;  Location: [facility];  Service: Vascular;  Laterality: N/A;  . TEE WITHOUT CARDIOVERSION N/A 05/28/2017   Procedure: TRANSESOPHAGEAL ECHOCARDIOGRAM (TEE);  Surgeon: Skeet Latch, MD;  Location: [facility];  Service: Cardiovascular;  Laterality: N/A;  . TEE WITHOUT CARDIOVERSION N/A 05/30/2017   Procedure: TRANSESOPHAGEAL ECHOCARDIOGRAM (TEE);  Surgeon: Prescott Gum, Collier Salina, MD;  Location: [facility];  Service: Open Heart Surgery;  Laterality: N/A;     Current Outpatient Medications  Medication Sig Dispense Refill  . amLODipine (NORVASC) 5 MG tablet Take 5 mg by mouth at bedtime.    Marland Kitchen aspirin EC 81 MG tablet Take 81 mg by mouth daily.    . calcitRIOL (ROCALTROL) 0.25 MCG capsule Take 0.25 mcg by mouth See admin instructions. Take one capsule (0.25  mcg) by mouth twice weekly - Monday and Friday    . CALCIUM CARBONATE ANTACID PO Take 2 tablets by mouth daily as needed for indigestion or heartburn.     . carvedilol (COREG) 25 MG tablet Take 1 tablet (25 mg total) by mouth 2 (two) times daily with a meal.    . fluticasone (FLONASE) 50 MCG/ACT nasal spray Place 2 sprays into both nostrils daily as needed for allergies.    . furosemide (LASIX) 20 MG tablet Take two tablets in the morning and take one tablet in the evening. 270 tablet 3  . gabapentin (NEURONTIN) 300 MG capsule TAKE 1 CAPSULE(300 MG) BY MOUTH THREE TIMES DAILY AS NEEDED FOR PAIN 90 capsule 3  . hydrocortisone (ANUSOL-HC) 2.5 % rectal cream Place 1 application rectally as needed. 30 g 0  . ondansetron (ZOFRAN) 4 MG tablet Take 1 tablet (4 mg total) by mouth every 8 (eight) hours as needed for nausea or vomiting. 20 tablet 0  . OXYGEN Inhale 2 L into the lungs as needed (shortness of breath).    . potassium chloride SA (K-DUR,KLOR-CON) 20 MEQ tablet Take 20 mEq by mouth daily after lunch.     . Propylene Glycol (SYSTANE BALANCE) 0.6 % SOLN Place 1 drop into both eyes daily as needed (for dry eyes).    Marland Kitchen spironolactone (ALDACTONE) 25 MG tablet Take 1 tablet (25 mg total) by mouth daily. (Patient taking differently: Take 25 mg by mouth 2 (two) times a week. ) 90 tablet 3  . triamcinolone ointment (KENALOG) 0.5 % Apply 1 application topically daily as needed (itching).     No current facility-administered medications for this visit.    Allergies:   Methylprednisolone, Minoxidil, and Naproxen    ROS:  Please see the history of present illness.   Otherwise, review of systems are positive for none.   All other systems are reviewed and negative.    PHYSICAL EXAM: VS:  BP (!) 173/92   Pulse 85   SpO2 97%  , BMI There is no height or weight on file to calculate BMI. GEN:  No distress NECK:  No jugular venous distention at 90 degrees, waveform within normal limits, carotid upstroke  brisk and symmetric, no bruits, no thyromegaly, is very difficult to visualize the patient's neck veins because she has some muscle contractions and is unable to tilt her head back LYMPHATICS:  No cervical adenopathy LUNGS: Bilateral basilar crackles BACK:  No CVA tenderness CHEST:  Unremarkable HEART:  S1 and S2 within normal limits, no S3, positive S4, no clicks, no rubs, 3 out of 6 apical systolic murmur radiating slightly at the aortic outflow tract murmurs ABD:  Positive bowel sounds normal in frequency in pitch, no bruits, no rebound, no guarding, unable to assess midline mass or bruit with the patient seated. EXT:  2 plus pulses throughout, mild to moderate bilateral leg edema, no cyanosis no clubbing SKIN:  No rashes no nodules NEURO:  Cranial nerves II through XII grossly intact, motor grossly intact throughout PSYCH:  Cognitively intact, oriented to person place and time   EKG:  EKG is  ordered today. Sinus rhythm, rate 85, left bundle branch block, left axis deviation, no change from previous.   Recent Labs: 06/05/2019: B Natriuretic Peptide 994.8 06/09/2019: Magnesium 2.2 07/29/2019: ALT 12; BUN 53; Creatinine, Ser 2.37; Hemoglobin 8.4 Repeated and verified X2.; Platelets 287.0; Potassium 3.7; Pro B Natriuretic peptide (BNP) 1,517.0; Sodium 140    Lipid Panel    Component Value Date/Time   CHOL 132 02/25/2019 1404   TRIG 90.0 02/25/2019 1404   HDL 42.60 02/25/2019 1404   CHOLHDL 3 02/25/2019 1404   VLDL 18.0 02/25/2019 1404   LDLCALC 72 02/25/2019 1404      Wt Readings from Last 3 Encounters:  07/29/19 172 lb (78 kg)  06/09/19 186 lb 9.6 oz (84.6 kg)  04/06/19 172 lb (78 kg)      Other studies Reviewed: Additional studies/ records that were reviewed today include:  Hospital records.   Review of the above records demonstrates:  Please see elsewhere in the note.     ASSESSMENT AND PLAN:  HTN:  Slightly elevated but she says it is always in the 140s over 68s.  I  will have a low threshold to increase her medications based on future readings in the office and tomorrow complete blood pressure diary at home.   CHRONIC LOWER EXTREMITY EDEMA:   She does have some evidence of volume overload which will be addressed below.  Of also asked her to try to keep her feet elevated.  She eats all of her meals from either Meals on Wheels or something her brother-in-law brings in and there might be more salt than I would like and we did talk about this.  ACUTE ON CHRONIC DIASTOLIC HF: She seems to be volume overloaded mildly on can have her increase her Lasix to 80 mg.  And then see her back next week for volume management and basic metabolic profile.   AVR:I will facilitate follow-up with Dr.Van Trigt in the future after we get her volume corrected.  CKD Stage IV:  Creatinine has been stable though mildly elevated.  Last creatinine was 2.37.  I will follow this closely.  She is also being followed by nephrology.  AO DISSECTION STATUS POST REPAIR: She had stable wound on echo.    COVID EDUCATION: She has had Covid recently.   Current medicines are reviewed at length with the patient today.  The patient does not have concerns regarding medicines.  The following changes have been made:  no change  Labs/ tests ordered today include:   Orders Placed This Encounter  Procedures  . EKG 12-Lead     Disposition:   FU with me in in one week or with APP.     Signed, Minus Breeding, MD  08/13/2019 3:20 PM     [facility]

## 2019-08-13 ENCOUNTER — Ambulatory Visit: Payer: Medicare Other | Admitting: Cardiology

## 2019-08-13 ENCOUNTER — Encounter: Payer: Self-pay | Admitting: Cardiology

## 2019-08-13 ENCOUNTER — Other Ambulatory Visit: Payer: Self-pay

## 2019-08-13 VITALS — BP 173/92 | HR 85

## 2019-08-13 DIAGNOSIS — M7989 Other specified soft tissue disorders: Secondary | ICD-10-CM | POA: Diagnosis not present

## 2019-08-13 DIAGNOSIS — I10 Essential (primary) hypertension: Secondary | ICD-10-CM

## 2019-08-13 DIAGNOSIS — N184 Chronic kidney disease, stage 4 (severe): Secondary | ICD-10-CM | POA: Diagnosis not present

## 2019-08-13 DIAGNOSIS — I5032 Chronic diastolic (congestive) heart failure: Secondary | ICD-10-CM

## 2019-08-13 DIAGNOSIS — I7101 Dissection of ascending aorta: Secondary | ICD-10-CM

## 2019-08-13 DIAGNOSIS — Z952 Presence of prosthetic heart valve: Secondary | ICD-10-CM | POA: Diagnosis not present

## 2019-08-13 NOTE — Patient Instructions (Signed)
Medication Instructions:  INCREASE LASIX TO 80MG  ONCE A DAY FOR 3 DAYS THEN REDUCE BACK TO 40MG  IN THE MORNING AND 20MG  IN THE EVENING *If you need a refill on your cardiac medications before your next appointment, please call your pharmacy*  Lab Work: NONE ORDERED THIS VISIT  Testing/Procedures: NONE ORDERED THIS VISIT  Follow-Up: At [facility], you and your health needs are our priority.  As part of our continuing mission to provide you with exceptional heart care, we have created designated Provider Care Teams.  These Care Teams include your primary Cardiologist (physician) and Advanced Practice Providers (APPs -  Physician Assistants and Nurse Practitioners) who all work together to provide you with the care you need, when you need it.  Your next appointment:   1 week(s)  The format for your next appointment:   In Person  Provider:   You may see Minus Breeding, MD or one of the following Advanced Practice Providers on your designated Care Team:    Almyra Deforest, PA-C  Jory Sims, DNP, ANP  Kerin Ransom, PA-C  Coletta Memos, NP

## 2019-08-19 DIAGNOSIS — I5030 Unspecified diastolic (congestive) heart failure: Secondary | ICD-10-CM | POA: Diagnosis not present

## 2019-08-19 DIAGNOSIS — I5033 Acute on chronic diastolic (congestive) heart failure: Secondary | ICD-10-CM | POA: Diagnosis not present

## 2019-08-20 ENCOUNTER — Ambulatory Visit: Payer: Medicare Other | Admitting: Cardiology

## 2019-08-26 ENCOUNTER — Ambulatory Visit: Payer: Medicare Other | Attending: Critical Care Medicine

## 2019-08-26 DIAGNOSIS — Z23 Encounter for immunization: Secondary | ICD-10-CM

## 2019-08-26 NOTE — Progress Notes (Signed)
   Covid-19 Vaccination Clinic  Name:  [name]    MRN: [id number] DOB: 1945-07-03  08/26/2019  [name] was observed post Covid-19 immunization for 15 minutes without incident. She was provided with Vaccine Information Sheet and instruction to access the V-Safe system.   [name] was instructed to call 911 with any severe reactions post vaccine: Marland Kitchen Difficulty breathing  . Swelling of face and throat  . A fast heartbeat  . A bad rash all over body  . Dizziness and weakness   Immunizations Administered    Name Date Dose VIS Date Route   Moderna COVID-19 Vaccine 08/26/2019  1:30 PM 0.5 mL 03/2019 Intramuscular   Manufacturer: Moderna   Lot: [id number]   [place]: 23017-209-10

## 2019-08-27 ENCOUNTER — Ambulatory Visit: Payer: Medicare Other | Admitting: Cardiology

## 2019-09-19 DIAGNOSIS — I5033 Acute on chronic diastolic (congestive) heart failure: Secondary | ICD-10-CM | POA: Diagnosis not present

## 2019-09-19 DIAGNOSIS — I5030 Unspecified diastolic (congestive) heart failure: Secondary | ICD-10-CM | POA: Diagnosis not present

## 2019-09-23 ENCOUNTER — Ambulatory Visit: Payer: Medicare Other | Attending: Critical Care Medicine

## 2019-09-23 DIAGNOSIS — Z23 Encounter for immunization: Secondary | ICD-10-CM

## 2019-09-23 NOTE — Progress Notes (Signed)
   Covid-19 Vaccination Clinic  Name:  [name]    MRN: [id number] DOB: 08-01-1945  09/23/2019  [name] was observed post Covid-19 immunization for 15 minutes  without incident. She was provided with Vaccine Information Sheet and instruction to access the V-Safe system.   [name] was instructed to call 911 with any severe reactions post vaccine: Marland Kitchen Difficulty breathing  . Swelling of face and throat  . A fast heartbeat  . A bad rash all over body  . Dizziness and weakness   Immunizations Administered    Name Date Dose VIS Date Route   Moderna COVID-19 Vaccine 09/23/2019  2:40 PM 0.5 mL 03/2019 Intramuscular   Manufacturer: Levan Hurst   Lot: [id number]   [place]: [id number]

## 2019-10-14 ENCOUNTER — Ambulatory Visit: Payer: Medicare Other | Admitting: Family Medicine

## 2019-10-19 ENCOUNTER — Ambulatory Visit (INDEPENDENT_AMBULATORY_CARE_PROVIDER_SITE_OTHER): Payer: Medicare Other | Admitting: Family Medicine

## 2019-10-19 ENCOUNTER — Encounter: Payer: Self-pay | Admitting: Family Medicine

## 2019-10-19 ENCOUNTER — Other Ambulatory Visit: Payer: Self-pay

## 2019-10-19 DIAGNOSIS — M170 Bilateral primary osteoarthritis of knee: Secondary | ICD-10-CM

## 2019-10-19 DIAGNOSIS — I5033 Acute on chronic diastolic (congestive) heart failure: Secondary | ICD-10-CM | POA: Diagnosis not present

## 2019-10-19 DIAGNOSIS — I5030 Unspecified diastolic (congestive) heart failure: Secondary | ICD-10-CM | POA: Diagnosis not present

## 2019-10-19 NOTE — Assessment & Plan Note (Addendum)
Severe arthritic changes of the knees bilaterally.  Injected again today.  Defer any surgical intervention secondary to patient's comorbidities.  Would not do well with rehab either.  Patient continues to respond well though to these injections at the mo  Discussed icing regimen  Follow-up again in 12 weeks encourage patient to do some walking in the home if possible.  Social determinants of health including with patient being unable to walk greater than 200 feet causing changes in medical management.

## 2019-10-19 NOTE — Progress Notes (Signed)
[name] Phone: [phone] Subjective:   [name], am serving as a scribe for Dr. Hulan Saas. This visit occurred during the SARS-CoV-2 public health emergency.  Safety protocols were in place, including screening questions prior to the visit, additional usage of staff PPE, and extensive cleaning of exam room while observing appropriate contact time as indicated for disinfecting solutions.   I'm seeing this patient by the request  of:  Martha Koch, MD  CC: bilateral knee pain   KPT:[id number]   07/29/2019 Severe osteoarthritic changes of the knees bilaterally.  Secondary to social determinants of health and patient's other comorbidities including congestive heart failure and chronic kidney disease patient is not a surgical candidate I believe at this time.  Patient hopefully will continue to improve with the steroid injections.  Due to the other complicating factors of her health unable to do anti-inflammatories.  Discussed potential viscosupplementation.  Continue home exercises otherwise.  Topical anti-inflammatories.  Follow-up again 8 to 10 weeks  Update 10/19/2019 [name] is a 74 y.o. female coming in with complaint of bilateral knee pain. Patient states that left is worse than right.  Would like injections.   Also having intermittent lower back pain. Had [place] vaccination in May had has had pain since then. States that she is having hard time standing up without pain. Today is a good day.   MRI lumbar spine MRI LUMBAR SPINE IMPRESSION  1. Normal MRI appearance of the distal spinal cord/conus medullaris. Myers evidence for cord infarct. 2. Moderate sized central disc protrusion at C4-5 with resultant moderate canal and severe bilateral lateral recess stenosis with bilateral L5 nerve root impingement. 3. Additional fairly mild for age multilevel degenerative spondylolysis as above. Myers  other significant stenosis or neural impingement. 4. Markedly abnormal appearance of the visualized bone marrow, suspected to be related to patient's profound anemia and/or Osteoporosis.      Past Medical History:  Diagnosis Date  . Acute pancreatitis 02/04/2017   [name] 02/04/2017  . Adenomatous colon polyp 01/2002  . Anemia, chronic disease    /notes 02/04/2017  . Aortic valve disease    AI/AS  . Arthritis    "lower back, knees" (02/04/2017)  . CHF (congestive heart failure) ([place])   . CKD (chronic kidney disease), stage III   . Diverticulosis of colon   . DJD (degenerative joint disease)   . Frequent UTI   . Hemorrhoids   . Hypertension   . Lumbar back pain   . Neuropathy   . Obesity   . Pericardial effusion     in a patient with Diastolic heart  failure and Pericardial effusion  known since last 2 D echo in 11/2016 /notes 02/04/2017  . PONV (postoperative nausea and vomiting)   . Pulmonary HTN ([place])   . Renal cyst   . Venous insufficiency   . Vitamin D deficiency    Past Surgical History:  Procedure Laterality Date  . ABDOMINAL HYSTERECTOMY     "partial"  . AORTIC VALVE REPLACEMENT N/A 05/30/2017   Procedure: AORTIC VALVE REPLACEMENT (AVR);  Surgeon: Prescott Gum, Collier Salina, MD;  Location: [facility];  Service: Vascular;  Laterality: N/A;  . COLONOSCOPY W/ BIOPSIES AND POLYPECTOMY     "bx was ok"  . IR THORACENTESIS ASP PLEURAL SPACE W/IMG GUIDE  06/11/2017  . IR THORACENTESIS ASP PLEURAL SPACE W/IMG GUIDE  06/12/2017  . REPAIR OF ACUTE ASCENDING THORACIC AORTIC DISSECTION N/A 05/30/2017  Procedure: REPLACEMENT OF ASCENDING AORTIC ANEURYSM AND REPAIRED CHRONIC ROOT DISSECTION WITH CIRC ARREST;  Surgeon: Ivin Poot, MD;  Location: [facility];  Service: Vascular;  Laterality: N/A;  . TEE WITHOUT CARDIOVERSION N/A 05/28/2017   Procedure: TRANSESOPHAGEAL ECHOCARDIOGRAM (TEE);  Surgeon: Skeet Latch, MD;  Location: [facility];  Service: Cardiovascular;  Laterality: N/A;  . TEE  WITHOUT CARDIOVERSION N/A 05/30/2017   Procedure: TRANSESOPHAGEAL ECHOCARDIOGRAM (TEE);  Surgeon: Prescott Gum, Collier Salina, MD;  Location: [facility];  Service: Open Heart Surgery;  Laterality: N/A;   Social History   Socioeconomic History  . Marital status: Married    Spouse name: [name]  . Number of children: 1  . Years of education: 73  . Highest education level: Not on file  Occupational History  . Occupation: retired from Administrator, arts  Tobacco Use  . Smoking status: Former Smoker    Packs/day: 0.40    Years: 15.00    Pack years: 6.00    Types: Cigarettes    Quit date: 04/08/1982    Years since quitting: 37.5  . Smokeless tobacco: Never Used  Vaping Use  . Vaping Use: Never used  Substance and Sexual Activity  . Alcohol use: Myers  . Drug use: Myers  . Sexual activity: Never  Other Topics Concern  . Not on file  Social History Narrative   Lives at home w/ her husband   Right-handed   Daily caffeine    Social Determinants of Health   Financial Resource Strain:   . Difficulty of Paying Living Expenses:   Food Insecurity:   . Worried About Charity fundraiser in the Last Year:   . Arboriculturist in the Last Year:   Transportation Needs:   . Film/video editor (Medical):   Marland Kitchen Lack of Transportation (Non-Medical):   Physical Activity:   . Days of Exercise per Week:   . Minutes of Exercise per Session:   Stress:   . Feeling of Stress :   Social Connections:   . Frequency of Communication with Friends and Family:   . Frequency of Social Gatherings with Friends and Family:   . Attends Religious Services:   . Active Member of Clubs or Organizations:   . Attends Archivist Meetings:   Marland Kitchen Marital Status:    Allergies  Allergen Reactions  . Methylprednisolone Other (See Comments)    Caused severe GERD in October 2018  . Minoxidil Palpitations and Other (See Comments)    unable to sleep  . Naproxen Swelling   Family History  Problem Relation Age of Onset  . Diabetes  Mother   . Colon polyps Mother   . Hypertension Mother   . Hypertension Brother   . Hypertension Sister   . Colon cancer Neg Hx     Current Outpatient Medications (Endocrine & Metabolic):  .  calcitRIOL (ROCALTROL) 0.25 MCG capsule, Take 0.25 mcg by mouth See admin instructions. Take one capsule (0.25 mcg) by mouth twice weekly - Monday and Friday  Current Outpatient Medications (Cardiovascular):  .  amLODipine (NORVASC) 5 MG tablet, Take 5 mg by mouth at bedtime. .  carvedilol (COREG) 25 MG tablet, Take 1 tablet (25 mg total) by mouth 2 (two) times daily with a meal. .  furosemide (LASIX) 20 MG tablet, Take two tablets in the morning and take one tablet in the evening. Marland Kitchen  spironolactone (ALDACTONE) 25 MG tablet, Take 1 tablet (25 mg total) by mouth daily. (Patient taking differently: Take 25 mg  by mouth 2 (two) times a week. )  Current Outpatient Medications (Respiratory):  .  fluticasone (FLONASE) 50 MCG/ACT nasal spray, Place 2 sprays into both nostrils daily as needed for allergies.  Current Outpatient Medications (Analgesics):  .  aspirin EC 81 MG tablet, Take 81 mg by mouth daily.   Current Outpatient Medications (Other):  Marland Kitchen  CALCIUM CARBONATE ANTACID PO, Take 2 tablets by mouth daily as needed for indigestion or heartburn.  .  gabapentin (NEURONTIN) 300 MG capsule, TAKE 1 CAPSULE(300 MG) BY MOUTH THREE TIMES DAILY AS NEEDED FOR PAIN .  hydrocortisone (ANUSOL-HC) 2.5 % rectal cream, Place 1 application rectally as needed. .  ondansetron (ZOFRAN) 4 MG tablet, Take 1 tablet (4 mg total) by mouth every 8 (eight) hours as needed for nausea or vomiting. .  OXYGEN, Inhale 2 L into the lungs as needed (shortness of breath). .  potassium chloride SA (K-DUR,KLOR-CON) 20 MEQ tablet, Take 20 mEq by mouth daily after lunch.  .  Propylene Glycol (SYSTANE BALANCE) 0.6 % SOLN, Place 1 drop into both eyes daily as needed (for dry eyes). .  triamcinolone ointment (KENALOG) 0.5 %, Apply 1  application topically daily as needed (itching).   Reviewed prior external information including notes and imaging from  primary care provider As well as notes that were available from care everywhere and other healthcare systems.  Past medical history, social, surgical and family history all reviewed in electronic medical record.  Myers pertanent information unless stated regarding to the chief complaint.   Review of Systems:  Myers headache, visual changes, nausea, vomiting, diarrhea, constipation, dizziness, abdominal pain, skin rash, fevers, chills, night sweats, weight loss, swollen lymph nodes,  joint swelling, chest pain, shortness of breath, mood changes. POSITIVE muscle aches, body aches.   Objective  Blood pressure 138/86, pulse 76, height 5\' 5"  (1.651 m), SpO2 97 %.   General: Myers apparent distress alert and oriented x3 mood and affect normal, dressed appropriately.  Neck with torticollis  Patient is seated in a wheelchair at this time.  Knee exam shows the patient does have arthritic changes.  Lacks last 10 degrees of extension and has only flexion to 95 degrees.  Myers instability noted.  Trace effusion bilaterally.  After informed written and verbal consent, patient was seated on exam table. Right knee was prepped with alcohol swab and utilizing anterolateral approach, patient's right knee space was injected with 4:1  marcaine 0.5%: Kenalog 40mg /dL. Patient tolerated the procedure well without immediate complications.  After informed written and verbal consent, patient was seated on exam table. Left knee was prepped with alcohol swab and utilizing anterolateral approach, patient's left knee space was injected with 4:1  marcaine 0.5%: Kenalog 40mg /dL. Patient tolerated the procedure well without immediate complications.    Impression and Recommendations:     The above documentation has been reviewed and is accurate and complete Lyndal Pulley, DO       Note: This dictation was  prepared with Dragon dictation along with smaller phrase technology. Any transcriptional errors that result from this process are unintentional.

## 2019-10-19 NOTE — Patient Instructions (Signed)
Injected both knees today See me again in 10-12 weeks

## 2019-11-03 ENCOUNTER — Telehealth: Payer: Self-pay | Admitting: Family Medicine

## 2019-11-03 ENCOUNTER — Other Ambulatory Visit: Payer: Self-pay

## 2019-11-03 MED ORDER — PREDNISONE 20 MG PO TABS
20.0000 mg | ORAL_TABLET | Freq: Every day | ORAL | 0 refills | Status: DC
Start: 2019-11-03 — End: 2020-06-09

## 2019-11-03 NOTE — Telephone Encounter (Signed)
Spoke with patient about taking prednisone for next 5 days. She will then call if not better.

## 2019-11-03 NOTE — Telephone Encounter (Signed)
Pt was seen earlier this month for her knees, stated that she mentioned issue with her L hand and thought Dr. Tamala Julian said it was gout. L hand is swollen and sore, cannot lift it over her head and is unable to dress ( so cannot come in for visit per patient )  She has been using tart cherry, is there something else we could call in our recommend for her?  [facility]

## 2019-11-03 NOTE — Telephone Encounter (Signed)
Very difficult with patient's other comorbidities.  The patient does have chronic kidney failure and is unable to take the allopurinol of the colchicine likely regularly.  Would consider potentially with prednisone for short course given improvement but if worsening we need to see no problem with her provider very quickly.

## 2019-11-19 DIAGNOSIS — I5033 Acute on chronic diastolic (congestive) heart failure: Secondary | ICD-10-CM | POA: Diagnosis not present

## 2019-11-19 DIAGNOSIS — I5030 Unspecified diastolic (congestive) heart failure: Secondary | ICD-10-CM | POA: Diagnosis not present

## 2019-12-20 DIAGNOSIS — I5033 Acute on chronic diastolic (congestive) heart failure: Secondary | ICD-10-CM | POA: Diagnosis not present

## 2019-12-20 DIAGNOSIS — I5030 Unspecified diastolic (congestive) heart failure: Secondary | ICD-10-CM | POA: Diagnosis not present

## 2019-12-28 ENCOUNTER — Ambulatory Visit: Payer: Medicare Other | Admitting: Family Medicine

## 2020-01-19 DIAGNOSIS — I5030 Unspecified diastolic (congestive) heart failure: Secondary | ICD-10-CM | POA: Diagnosis not present

## 2020-01-19 DIAGNOSIS — I5033 Acute on chronic diastolic (congestive) heart failure: Secondary | ICD-10-CM | POA: Diagnosis not present

## 2020-02-04 ENCOUNTER — Ambulatory Visit: Payer: Medicare Other | Admitting: Family Medicine

## 2020-02-04 ENCOUNTER — Other Ambulatory Visit: Payer: Self-pay

## 2020-02-04 ENCOUNTER — Encounter: Payer: Self-pay | Admitting: Family Medicine

## 2020-02-04 DIAGNOSIS — M170 Bilateral primary osteoarthritis of knee: Secondary | ICD-10-CM

## 2020-02-04 MED ORDER — GABAPENTIN 100 MG PO CAPS
100.0000 mg | ORAL_CAPSULE | Freq: Every day | ORAL | 0 refills | Status: AC
Start: 2020-02-04 — End: ?

## 2020-02-04 NOTE — Patient Instructions (Signed)
Injections today Stop 300mg  of gabapentin  Take 100mg  of gabapentin at night See me again in 10-12 weeks

## 2020-02-04 NOTE — Progress Notes (Signed)
[place] Phone: [phone] Subjective:   [name], am serving as a scribe for Dr. Hulan Saas. This visit occurred during the SARS-CoV-2 public health emergency.  Safety protocols were in place, including screening questions prior to the visit, additional usage of staff PPE, and extensive cleaning of exam room while observing appropriate contact time as indicated for disinfecting solutions.   I'm seeing this patient by the request  of:  Hoyt Koch, MD  CC: Bilateral knee pain  GLO:[id number]   10/19/2019 Severe arthritic changes of the knees bilaterally.  Injected again today.  Defer any surgical intervention secondary to patient's comorbidities.  Would not do well with rehab either.  Patient continues to respond well though to these injections at the mo  Discussed icing regimen  Follow-up again in 12 weeks encourage patient to do some walking in the home if possible.  Social determinants of health including with patient being unable to walk greater than 200 feet causing changes in medical management.  Update 02/04/2020 [name] is a 74 y.o. female coming in with complaint of bilateral knee pain. Patient states that injections wore off 1-2 months ago. Using tart cherry extract which helps hand swelling.  Patient states that she Myers longer is taking her gabapentin now because it is making her very fatigued tired and almost felt like she was going to fall.  Would like to have Rx for gabapentin that is less than 300mg . Using 300mg  prn.       Past Medical History:  Diagnosis Date  . Acute pancreatitis 02/04/2017   [name] 02/04/2017  . Adenomatous colon polyp 01/2002  . Anemia, chronic disease    /notes 02/04/2017  . Aortic valve disease    AI/AS  . Arthritis    "lower back, knees" (02/04/2017)  . CHF (congestive heart failure) ([place])   . CKD (chronic kidney disease), stage III   .  Diverticulosis of colon   . DJD (degenerative joint disease)   . Frequent UTI   . Hemorrhoids   . Hypertension   . Lumbar back pain   . Neuropathy   . Obesity   . Pericardial effusion     in a patient with Diastolic heart  failure and Pericardial effusion  known since last 2 D echo in 11/2016 /notes 02/04/2017  . PONV (postoperative nausea and vomiting)   . Pulmonary HTN ([place])   . Renal cyst   . Venous insufficiency   . Vitamin D deficiency    Past Surgical History:  Procedure Laterality Date  . ABDOMINAL HYSTERECTOMY     "partial"  . AORTIC VALVE REPLACEMENT N/A 05/30/2017   Procedure: AORTIC VALVE REPLACEMENT (AVR);  Surgeon: Prescott Gum, Collier Salina, MD;  Location: [facility];  Service: Vascular;  Laterality: N/A;  . COLONOSCOPY W/ BIOPSIES AND POLYPECTOMY     "bx was ok"  . IR THORACENTESIS ASP PLEURAL SPACE W/IMG GUIDE  06/11/2017  . IR THORACENTESIS ASP PLEURAL SPACE W/IMG GUIDE  06/12/2017  . REPAIR OF ACUTE ASCENDING THORACIC AORTIC DISSECTION N/A 05/30/2017   Procedure: REPLACEMENT OF ASCENDING AORTIC ANEURYSM AND REPAIRED CHRONIC ROOT DISSECTION WITH CIRC ARREST;  Surgeon: Ivin Poot, MD;  Location: [facility];  Service: Vascular;  Laterality: N/A;  . TEE WITHOUT CARDIOVERSION N/A 05/28/2017   Procedure: TRANSESOPHAGEAL ECHOCARDIOGRAM (TEE);  Surgeon: Skeet Latch, MD;  Location: [facility];  Service: Cardiovascular;  Laterality: N/A;  . TEE WITHOUT CARDIOVERSION N/A 05/30/2017  Procedure: TRANSESOPHAGEAL ECHOCARDIOGRAM (TEE);  Surgeon: Prescott Gum, Collier Salina, MD;  Location: [facility];  Service: Open Heart Surgery;  Laterality: N/A;   Social History   Socioeconomic History  . Marital status: Married    Spouse name: Juanda Crumble  . Number of children: 1  . Years of education: 19  . Highest education level: Not on file  Occupational History  . Occupation: retired from [employer], [employer]  Tobacco Use  . Smoking status: Former Smoker    Packs/day: 0.40    Years: 15.00    Pack years: 6.00     Types: Cigarettes    Quit date: 04/08/1982    Years since quitting: 37.8  . Smokeless tobacco: Never Used  Vaping Use  . Vaping Use: Never used  Substance and Sexual Activity  . Alcohol use: Myers  . Drug use: Myers  . Sexual activity: Never  Other Topics Concern  . Not on file  Social History Narrative   Lives at home w/ her husband   Right-handed   Daily caffeine    Social Determinants of Health   Financial Resource Strain:   . Difficulty of Paying Living Expenses: Not on file  Food Insecurity:   . Worried About Charity fundraiser in the Last Year: Not on file  . Ran Out of Food in the Last Year: Not on file  Transportation Needs:   . Lack of Transportation (Medical): Not on file  . Lack of Transportation (Non-Medical): Not on file  Physical Activity:   . Days of Exercise per Week: Not on file  . Minutes of Exercise per Session: Not on file  Stress:   . Feeling of Stress : Not on file  Social Connections:   . Frequency of Communication with Friends and Family: Not on file  . Frequency of Social Gatherings with Friends and Family: Not on file  . Attends Religious Services: Not on file  . Active Member of Clubs or Organizations: Not on file  . Attends Archivist Meetings: Not on file  . Marital Status: Not on file   Allergies  Allergen Reactions  . Methylprednisolone Other (See Comments)    Caused severe GERD in October 2018  . Minoxidil Palpitations and Other (See Comments)    unable to sleep  . Naproxen Swelling   Family History  Problem Relation Age of Onset  . Diabetes Mother   . Colon polyps Mother   . Hypertension Mother   . Hypertension Brother   . Hypertension Sister   . Colon cancer Neg Hx     Current Outpatient Medications (Endocrine & Metabolic):  .  calcitRIOL (ROCALTROL) 0.25 MCG capsule, Take 0.25 mcg by mouth See admin instructions. Take one capsule (0.25 mcg) by mouth twice weekly - Monday and Friday .  predniSONE (DELTASONE) 20 MG  tablet, Take 1 tablet (20 mg total) by mouth daily with breakfast.  Current Outpatient Medications (Cardiovascular):  .  amLODipine (NORVASC) 5 MG tablet, Take 5 mg by mouth at bedtime. .  carvedilol (COREG) 25 MG tablet, Take 1 tablet (25 mg total) by mouth 2 (two) times daily with a meal. .  furosemide (LASIX) 20 MG tablet, Take two tablets in the morning and take one tablet in the evening. Marland Kitchen  spironolactone (ALDACTONE) 25 MG tablet, Take 1 tablet (25 mg total) by mouth daily. (Patient taking differently: Take 25 mg by mouth 2 (two) times a week. )  Current Outpatient Medications (Respiratory):  .  fluticasone (FLONASE) 50 MCG/ACT nasal  spray, Place 2 sprays into both nostrils daily as needed for allergies.  Current Outpatient Medications (Analgesics):  .  aspirin EC 81 MG tablet, Take 81 mg by mouth daily.   Current Outpatient Medications (Other):  Marland Kitchen  CALCIUM CARBONATE ANTACID PO, Take 2 tablets by mouth daily as needed for indigestion or heartburn.  .  gabapentin (NEURONTIN) 300 MG capsule, TAKE 1 CAPSULE(300 MG) BY MOUTH THREE TIMES DAILY AS NEEDED FOR PAIN .  hydrocortisone (ANUSOL-HC) 2.5 % rectal cream, Place 1 application rectally as needed. .  ondansetron (ZOFRAN) 4 MG tablet, Take 1 tablet (4 mg total) by mouth every 8 (eight) hours as needed for nausea or vomiting. .  OXYGEN, Inhale 2 L into the lungs as needed (shortness of breath). .  potassium chloride SA (K-DUR,KLOR-CON) 20 MEQ tablet, Take 20 mEq by mouth daily after lunch.  .  Propylene Glycol (SYSTANE BALANCE) 0.6 % SOLN, Place 1 drop into both eyes daily as needed (for dry eyes). .  triamcinolone ointment (KENALOG) 0.5 %, Apply 1 application topically daily as needed (itching). .  gabapentin (NEURONTIN) 100 MG capsule, Take 1 capsule (100 mg total) by mouth at bedtime.   Reviewed prior external information including notes and imaging from  primary care provider As well as notes that were available from care everywhere  and other healthcare systems.  Past medical history, social, surgical and family history all reviewed in electronic medical record.  Myers pertanent information unless stated regarding to the chief complaint.   Review of Systems:  Myers headache, visual changes, nausea, vomiting, diarrhea, constipation, dizziness, abdominal pain, skin rash, fevers, chills, night sweats, weight loss, swollen lymph nodes,chest pain, shortness of breath, mood changes. POSITIVE muscle aches, body aches, joint swelling  Objective  Blood pressure 130/70, pulse 69, height 5\' 5"  (1.651 m), SpO2 97 %.   General: Myers apparent distress alert and oriented x3 mood and affect normal, dressed appropriately.  Sitting in a wheelchair HEENT: Pupils equal, extraocular movements intact  Respiratory: Patient's speak in full sentences and does not appear short of breath  Cardiovascular: 1+ lower extremity edema, non tender, Myers erythema  Sitting in a wheelchair Patient has severe arthritic changes of multiple joints.  Patient does have some torticollis noted  MSK: Exam done patient is sitting in her wheelchair.  Patient's knee is severely tender to palpation.  Seems to be more on the medial compartment.  Crepitus noted with any range of motion.  Trace effusion noted of the knees bilaterally decreased range of motion in all planes instability noted with valgus and varus force  After informed written and verbal consent, patient was seated on exam table. Right knee was prepped with alcohol swab and utilizing anterolateral approach, patient's right knee space was injected with 4:1  marcaine 0.5%: Kenalog 40mg /dL. Patient tolerated the procedure well without immediate complications.  After informed written and verbal consent, patient was seated on exam table. Left knee was prepped with alcohol swab and utilizing anterolateral approach, patient's left knee space was injected with 4:1  marcaine 0.5%: Kenalog 40mg /dL. Patient tolerated the procedure  well without immediate complications.    Impression and Recommendations:     The above documentation has been reviewed and is accurate and complete Lyndal Pulley, DO

## 2020-02-04 NOTE — Assessment & Plan Note (Signed)
Patient has had x-rays previously in 2019 showing severe arthritic changes.  Patient continues to have chronic exacerbation of this.  Stops her from activity.  Showed patient a hinged brace today to see if this would help her at all.  Patient has many different comorbidities and social determinants of health and make this very difficult.  Chronic kidney disease stage IV.  I would like patient to discontinue the gabapentin 300 mg and give only 100 mg at night to help with the pain and maybe patient will take it on a more regular basis secondary to less side effects.  Encourage patient to try to stay active where possible.  May need to follow-up with her back doctor for more evaluation as well if she continues to have some of the weakness.  Follow-up for me again in 10 to 12 weeks

## 2020-02-09 ENCOUNTER — Ambulatory Visit: Payer: Medicare Other

## 2020-02-10 ENCOUNTER — Telehealth: Payer: Self-pay

## 2020-02-10 NOTE — Telephone Encounter (Signed)
Patient called stating she saw Dr. Tamala Julian last week and received an injections and gabapentin. Gabapentin helps the pain going down the legs but does not help the knees. Patient states the injections in the knees helped with the swollen knees but is still having the pain making it hard to walk states used to take tramadol and that helped with her knee pain. Patient wanting to know if she can get that again or if there is something else that she can get to help with the bilateral knee pain.

## 2020-02-14 NOTE — Telephone Encounter (Signed)
Spoke with patient who is ok with contacting her PCP for medication.

## 2020-02-14 NOTE — Telephone Encounter (Signed)
Can we have her ask primary care provider first, she is quite a fall risk.  She may feel more comfortable knowing patient a little better

## 2020-02-19 DIAGNOSIS — I5033 Acute on chronic diastolic (congestive) heart failure: Secondary | ICD-10-CM | POA: Diagnosis not present

## 2020-02-19 DIAGNOSIS — I5030 Unspecified diastolic (congestive) heart failure: Secondary | ICD-10-CM | POA: Diagnosis not present

## 2020-02-24 DIAGNOSIS — I129 Hypertensive chronic kidney disease with stage 1 through stage 4 chronic kidney disease, or unspecified chronic kidney disease: Secondary | ICD-10-CM | POA: Diagnosis not present

## 2020-02-24 DIAGNOSIS — D631 Anemia in chronic kidney disease: Secondary | ICD-10-CM | POA: Diagnosis not present

## 2020-02-24 DIAGNOSIS — Z23 Encounter for immunization: Secondary | ICD-10-CM | POA: Diagnosis not present

## 2020-02-24 DIAGNOSIS — N189 Chronic kidney disease, unspecified: Secondary | ICD-10-CM | POA: Diagnosis not present

## 2020-02-24 DIAGNOSIS — N184 Chronic kidney disease, stage 4 (severe): Secondary | ICD-10-CM | POA: Diagnosis not present

## 2020-02-24 DIAGNOSIS — N2581 Secondary hyperparathyroidism of renal origin: Secondary | ICD-10-CM | POA: Diagnosis not present

## 2020-03-07 ENCOUNTER — Other Ambulatory Visit: Payer: Self-pay | Admitting: Cardiothoracic Surgery

## 2020-03-07 DIAGNOSIS — I7101 Dissection of ascending aorta: Secondary | ICD-10-CM

## 2020-03-08 ENCOUNTER — Other Ambulatory Visit: Payer: Self-pay

## 2020-03-08 ENCOUNTER — Ambulatory Visit: Payer: Medicare Other | Admitting: Cardiothoracic Surgery

## 2020-03-08 ENCOUNTER — Encounter: Payer: Self-pay | Admitting: Cardiothoracic Surgery

## 2020-03-08 ENCOUNTER — Ambulatory Visit
Admission: RE | Admit: 2020-03-08 | Discharge: 2020-03-08 | Disposition: A | Discharge status: Home / Self Care | Payer: Medicare Other | Source: Ambulatory Visit | Attending: Cardiothoracic Surgery | Admitting: Cardiothoracic Surgery

## 2020-03-08 VITALS — BP 160/80 | HR 71 | Resp 20 | Ht 65.0 in | Wt 173.0 lb

## 2020-03-08 DIAGNOSIS — I7101 Dissection of ascending aorta: Secondary | ICD-10-CM

## 2020-03-08 DIAGNOSIS — J90 Pleural effusion, not elsewhere classified: Secondary | ICD-10-CM | POA: Diagnosis not present

## 2020-03-08 NOTE — Progress Notes (Signed)
PCP is Hoyt Koch, MD Referring Provider is Minus Breeding, MD  Chief Complaint  Patient presents with  . Thoracic Aortic Dissection    2 year f/u with CXR, HX of AVR/replacement of ascending thoracic aorta    HPI: 74 year old frail female returns for 3-year follow-up after repair of a type a ascending aortic dissection with a Hemashield graft and replacement of the aortic valve for severe AI with a 21 mm magna ease bioprosthetic valve.  The patient is followed by Dr. Percival Spanish for diastolic heart failure and she has chronic renal insufficiency with creatinine of 2.4 followed by renal.  Her postoperative CT scan of the chest showed an intact aortic repair without pseudoaneurysm.  An echocardiogram performed earlier this year demonstrated EF 55% with a normal functioning bioprosthetic valve.  She has maintained sinus rhythm.  The patient presents today in a wheelchair as she has poor mobility from knee arthritis and receives steroid injections.  The patient has diastolic heart failure from her hypertension and LVH which is aggravated by her fluid retention from renal insufficiency.  Her current cardiac function is stable on her current meds to control blood pressure and fluid.   Past Medical History:  Diagnosis Date  . Acute pancreatitis 02/04/2017   [name] 02/04/2017  . Adenomatous colon polyp 01/2002  . Anemia, chronic disease    /notes 02/04/2017  . Aortic valve disease    AI/AS  . Arthritis    "lower back, knees" (02/04/2017)  . CHF (congestive heart failure) ([place])   . CKD (chronic kidney disease), stage III ([place])   . Diverticulosis of colon   . DJD (degenerative joint disease)   . Frequent UTI   . Hemorrhoids   . Hypertension   . Lumbar back pain   . Neuropathy   . Obesity   . Pericardial effusion     in a patient with Diastolic heart  failure and Pericardial effusion  known since last 2 D echo in 11/2016 /notes 02/04/2017  . PONV (postoperative nausea and  vomiting)   . Pulmonary HTN ([place])   . Renal cyst   . Venous insufficiency   . Vitamin D deficiency     Past Surgical History:  Procedure Laterality Date  . ABDOMINAL HYSTERECTOMY     "partial"  . AORTIC VALVE REPLACEMENT N/A 05/30/2017   Procedure: AORTIC VALVE REPLACEMENT (AVR);  Surgeon: Prescott Gum, Collier Salina, MD;  Location: [facility];  Service: Vascular;  Laterality: N/A;  . COLONOSCOPY W/ BIOPSIES AND POLYPECTOMY     "bx was ok"  . IR THORACENTESIS ASP PLEURAL SPACE W/IMG GUIDE  06/11/2017  . IR THORACENTESIS ASP PLEURAL SPACE W/IMG GUIDE  06/12/2017  . REPAIR OF ACUTE ASCENDING THORACIC AORTIC DISSECTION N/A 05/30/2017   Procedure: REPLACEMENT OF ASCENDING AORTIC ANEURYSM AND REPAIRED CHRONIC ROOT DISSECTION WITH CIRC ARREST;  Surgeon: Ivin Poot, MD;  Location: [facility];  Service: Vascular;  Laterality: N/A;  . TEE WITHOUT CARDIOVERSION N/A 05/28/2017   Procedure: TRANSESOPHAGEAL ECHOCARDIOGRAM (TEE);  Surgeon: Skeet Latch, MD;  Location: [facility];  Service: Cardiovascular;  Laterality: N/A;  . TEE WITHOUT CARDIOVERSION N/A 05/30/2017   Procedure: TRANSESOPHAGEAL ECHOCARDIOGRAM (TEE);  Surgeon: Prescott Gum, Collier Salina, MD;  Location: [facility];  Service: Open Heart Surgery;  Laterality: N/A;    Family History  Problem Relation Age of Onset  . Diabetes Mother   . Colon polyps Mother   . Hypertension Mother   . Hypertension Brother   . Hypertension Sister   . Colon cancer  Neg Hx     Social History Social History   Tobacco Use  . Smoking status: Former Smoker    Packs/day: 0.40    Years: 15.00    Pack years: 6.00    Types: Cigarettes    Quit date: 04/08/1982    Years since quitting: 37.9  . Smokeless tobacco: Never Used  Vaping Use  . Vaping Use: Never used  Substance Use Topics  . Alcohol use: No  . Drug use: No    Current Outpatient Medications  Medication Sig Dispense Refill  . amLODipine (NORVASC) 5 MG tablet Take 5 mg by mouth at bedtime.    Marland Kitchen aspirin EC 81 MG tablet  Take 81 mg by mouth daily.    . calcitRIOL (ROCALTROL) 0.25 MCG capsule Take 0.25 mcg by mouth See admin instructions. Take one capsule (0.25 mcg) by mouth twice weekly - Monday and Friday    . CALCIUM CARBONATE ANTACID PO Take 2 tablets by mouth daily as needed for indigestion or heartburn.     . carvedilol (COREG) 25 MG tablet Take 1 tablet (25 mg total) by mouth 2 (two) times daily with a meal.    . fluticasone (FLONASE) 50 MCG/ACT nasal spray Place 2 sprays into both nostrils daily as needed for allergies.    . furosemide (LASIX) 20 MG tablet Take two tablets in the morning and take one tablet in the evening. 270 tablet 3  . gabapentin (NEURONTIN) 100 MG capsule Take 1 capsule (100 mg total) by mouth at bedtime. 90 capsule 0  . hydrocortisone (ANUSOL-HC) 2.5 % rectal cream Place 1 application rectally as needed. 30 g 0  . ondansetron (ZOFRAN) 4 MG tablet Take 1 tablet (4 mg total) by mouth every 8 (eight) hours as needed for nausea or vomiting. 20 tablet 0  . OXYGEN Inhale 2 L into the lungs as needed (shortness of breath).    . potassium chloride SA (K-DUR,KLOR-CON) 20 MEQ tablet Take 20 mEq by mouth daily after lunch.     . predniSONE (DELTASONE) 20 MG tablet Take 1 tablet (20 mg total) by mouth daily with breakfast. 5 tablet 0  . Propylene Glycol (SYSTANE BALANCE) 0.6 % SOLN Place 1 drop into both eyes daily as needed (for dry eyes).    Marland Kitchen spironolactone (ALDACTONE) 25 MG tablet Take 1 tablet (25 mg total) by mouth daily. (Patient taking differently: Take 25 mg by mouth 2 (two) times a week. ) 90 tablet 3  . triamcinolone ointment (KENALOG) 0.5 % Apply 1 application topically daily as needed (itching).     No current facility-administered medications for this visit.    Allergies  Allergen Reactions  . Methylprednisolone Other (See Comments)    Caused severe GERD in October 2018  . Minoxidil Palpitations and Other (See Comments)    unable to sleep  . Naproxen Swelling    Review of  Systems   Patient was hospitalized for a week earlier this year for COVID-19 pneumonitis. She has intermittently increased ankle edema but takes to 80 mg tablets of Lasix daily.  She is compliant with her blood pressure medication  BP (!) 160/80   Pulse 71   Resp 20   Ht 5\' 5"  (1.651 m)   Wt 173 lb (78.5 kg)   SpO2 93% Comment: RA  BMI 28.79 kg/m  Physical Exam Alert and oriented but chronically ill and frail appearing 74 year old woman in a wheelchair Breath sounds are clear Heart rate regular Grade 2/6 systolic flow murmur from  her bioprosthetic valve 2+ ankle edema No focal neuro deficit  Diagnostic Tests: Chest x-ray performed today shows no significant pleural effusions or edema.  Impression: Stable cardiac status after repair of a ascending aortic dissection with a Hemashield graft and AVR with a bioprosthetic valve for severe AI 3 years ago.  Plan: The patient is followed by cardiology renal and by primary care.  She is encouraged to maintain good dental hygiene, be compliant with her blood pressure medication and to follow a heart healthy diet.  She will return here as needed for any further cardiac surgical issues.   Len Childs, MD [facility] [phone]

## 2020-03-16 DIAGNOSIS — I340 Nonrheumatic mitral (valve) insufficiency: Secondary | ICD-10-CM | POA: Insufficient documentation

## 2020-03-16 NOTE — Progress Notes (Deleted)
Cardiology Office Note   Date:  03/16/2020   ID:  [name], DOB 1945/10/22, MRN [id number]  PCP:  Hoyt Koch, MD  Cardiologist:   Minus Breeding, MD   No chief complaint on file.     History of Present Illness: [name] is a 74 y.o. female who presents for evaluation of repaired aortic dissection.  She is status post bioprosthetic AVR and replacement of the ascending thoracic aorta.  She has stage IV CKD and chronic diastolic HF with pleural effusions and edema. She was last in the hospital in February for treatment of COVID 19 pneumonia.  I reviewed all of these hospitalizations for this visit.   Echo at that time demonstrated an EF of 50 - 55%.  She had severe concentric LVH.  The root and aortic valve were stable.    She had increased dyspnea when I saw her last. She had a CXR in  April 22 which showed there was resolution of pneumonia and was some small right pleural effusion.  She did have a BNP that was 1517.  ***   *** It was suggested that she might increase her Lasix but she did not want to do this until being seen.  She does get short of breath episodically.  She is not describing new PND or orthopnea.  Her oxygen saturation at home is in the mid 90s and today it is 94% at rest.  She does have some increased leg swelling but she is not able to weigh herself daily.  She is not describing chest pressure, neck discomfort or arm discomfort.  She had no palpitations, presyncope or syncope.  She has had no new cough fevers or chills.  Past Medical History:  Diagnosis Date  . Acute pancreatitis 02/04/2017   [name] 02/04/2017  . Adenomatous colon polyp 01/2002  . Anemia, chronic disease    /notes 02/04/2017  . Aortic valve disease    AI/AS  . Arthritis    "lower back, knees" (02/04/2017)  . CHF (congestive heart failure) ([place])   . CKD (chronic kidney disease), stage III ([place])   . Diverticulosis of colon   . DJD (degenerative joint disease)   . Frequent  UTI   . Hemorrhoids   . Hypertension   . Lumbar back pain   . Neuropathy   . Obesity   . Pericardial effusion     in a patient with Diastolic heart  failure and Pericardial effusion  known since last 2 D echo in 11/2016 /notes 02/04/2017  . PONV (postoperative nausea and vomiting)   . Pulmonary HTN ([place])   . Renal cyst   . Venous insufficiency   . Vitamin D deficiency     Past Surgical History:  Procedure Laterality Date  . ABDOMINAL HYSTERECTOMY     "partial"  . AORTIC VALVE REPLACEMENT N/A 05/30/2017   Procedure: AORTIC VALVE REPLACEMENT (AVR);  Surgeon: Prescott Gum, Collier Salina, MD;  Location: [place];  Service: Vascular;  Laterality: N/A;  . COLONOSCOPY W/ BIOPSIES AND POLYPECTOMY     "bx was ok"  . IR THORACENTESIS ASP PLEURAL SPACE W/IMG GUIDE  06/11/2017  . IR THORACENTESIS ASP PLEURAL SPACE W/IMG GUIDE  06/12/2017  . REPAIR OF ACUTE ASCENDING THORACIC AORTIC DISSECTION N/A 05/30/2017   Procedure: REPLACEMENT OF ASCENDING AORTIC ANEURYSM AND REPAIRED CHRONIC ROOT DISSECTION WITH CIRC ARREST;  Surgeon: Ivin Poot, MD;  Location: [place];  Service: Vascular;  Laterality: N/A;  . TEE WITHOUT CARDIOVERSION N/A 05/28/2017  Procedure: TRANSESOPHAGEAL ECHOCARDIOGRAM (TEE);  Surgeon: Skeet Latch, MD;  Location: [place];  Service: Cardiovascular;  Laterality: N/A;  . TEE WITHOUT CARDIOVERSION N/A 05/30/2017   Procedure: TRANSESOPHAGEAL ECHOCARDIOGRAM (TEE);  Surgeon: Prescott Gum, Collier Salina, MD;  Location: [place];  Service: Open Heart Surgery;  Laterality: N/A;     Current Outpatient Medications  Medication Sig Dispense Refill  . amLODipine (NORVASC) 5 MG tablet Take 5 mg by mouth at bedtime.    Marland Kitchen aspirin EC 81 MG tablet Take 81 mg by mouth daily.    . calcitRIOL (ROCALTROL) 0.25 MCG capsule Take 0.25 mcg by mouth See admin instructions. Take one capsule (0.25 mcg) by mouth twice weekly - Monday and Friday    . CALCIUM CARBONATE ANTACID PO Take 2 tablets by mouth daily as needed for  indigestion or heartburn.     . carvedilol (COREG) 25 MG tablet Take 1 tablet (25 mg total) by mouth 2 (two) times daily with a meal.    . fluticasone (FLONASE) 50 MCG/ACT nasal spray Place 2 sprays into both nostrils daily as needed for allergies.    . furosemide (LASIX) 20 MG tablet Take two tablets in the morning and take one tablet in the evening. 270 tablet 3  . gabapentin (NEURONTIN) 100 MG capsule Take 1 capsule (100 mg total) by mouth at bedtime. 90 capsule 0  . hydrocortisone (ANUSOL-HC) 2.5 % rectal cream Place 1 application rectally as needed. 30 g 0  . ondansetron (ZOFRAN) 4 MG tablet Take 1 tablet (4 mg total) by mouth every 8 (eight) hours as needed for nausea or vomiting. 20 tablet 0  . OXYGEN Inhale 2 L into the lungs as needed (shortness of breath).    . potassium chloride SA (K-DUR,KLOR-CON) 20 MEQ tablet Take 20 mEq by mouth daily after lunch.     . predniSONE (DELTASONE) 20 MG tablet Take 1 tablet (20 mg total) by mouth daily with breakfast. 5 tablet 0  . Propylene Glycol (SYSTANE BALANCE) 0.6 % SOLN Place 1 drop into both eyes daily as needed (for dry eyes).    Marland Kitchen spironolactone (ALDACTONE) 25 MG tablet Take 1 tablet (25 mg total) by mouth daily. (Patient taking differently: Take 25 mg by mouth 2 (two) times a week. ) 90 tablet 3  . triamcinolone ointment (KENALOG) 0.5 % Apply 1 application topically daily as needed (itching).     No current facility-administered medications for this visit.    Allergies:   Methylprednisolone, Minoxidil, and Naproxen    ROS:  Please see the history of present illness.   Otherwise, review of systems are positive for ***.   All other systems are reviewed and negative.    PHYSICAL EXAM: VS:  There were no vitals taken for this visit. , BMI There is no height or weight on file to calculate BMI. GENERAL:  Well appearing NECK:  No jugular venous distention, waveform within normal limits, carotid upstroke brisk and symmetric, no bruits, no  thyromegaly LUNGS:  Clear to auscultation bilaterally CHEST:  Unremarkable HEART:  PMI not displaced or sustained,S1 and S2 within normal limits, no S3, no S4, no clicks, no rubs, *** murmurs ABD:  Flat, positive bowel sounds normal in frequency in pitch, no bruits, no rebound, no guarding, no midline pulsatile mass, no hepatomegaly, no splenomegaly EXT:  2 plus pulses throughout, no edema, no cyanosis no clubbing    ***GEN:  No distress NECK:  No jugular venous distention at 90 degrees, waveform within normal limits, carotid upstroke  brisk and symmetric, no bruits, no thyromegaly, is very difficult to visualize the patient's neck veins because she has some muscle contractions and is unable to tilt her head back LYMPHATICS:  No cervical adenopathy LUNGS: Bilateral basilar crackles BACK:  No CVA tenderness CHEST:  Unremarkable HEART:  S1 and S2 within normal limits, no S3, positive S4, no clicks, no rubs, 3 out of 6 apical systolic murmur radiating slightly at the aortic outflow tract murmurs ABD:  Positive bowel sounds normal in frequency in pitch, no bruits, no rebound, no guarding, unable to assess midline mass or bruit with the patient seated. EXT:  2 plus pulses throughout, mild to moderate bilateral leg edema, no cyanosis no clubbing SKIN:  No rashes no nodules NEURO:  Cranial nerves II through XII grossly intact, motor grossly intact throughout PSYCH:  Cognitively intact, oriented to person place and time   EKG:  EKG is *** ordered today. Sinus rhythm, rate ***, left bundle branch block, left axis deviation, no change from previous.   Recent Labs: 06/05/2019: B Natriuretic Peptide 994.8 06/09/2019: Magnesium 2.2 07/29/2019: ALT 12; BUN 53; Creatinine, Ser 2.37; Hemoglobin 8.4 Repeated and verified X2.; Platelets 287.0; Potassium 3.7; Pro B Natriuretic peptide (BNP) 1,517.0; Sodium 140    Lipid Panel    Component Value Date/Time   CHOL 132 02/25/2019 1404   TRIG 90.0 02/25/2019  1404   HDL 42.60 02/25/2019 1404   CHOLHDL 3 02/25/2019 1404   VLDL 18.0 02/25/2019 1404   LDLCALC 72 02/25/2019 1404      Wt Readings from Last 3 Encounters:  03/08/20 173 lb (78.5 kg)  07/29/19 172 lb (78 kg)  06/09/19 186 lb 9.6 oz (84.6 kg)      Other studies Reviewed: Additional studies/ records that were reviewed today include: *** Review of the above records demonstrates:  Please see elsewhere in the note.     ASSESSMENT AND PLAN:  HTN: ***  Slightly elevated but she says it is always in the 140s over 80s.  I will have a low threshold to increase her medications based on future readings in the office and tomorrow complete blood pressure diary at home.   CHRONIC LOWER EXTREMITY EDEMA:   *** She does have some evidence of volume overload which will be addressed below.  Of also asked her to try to keep her feet elevated.  She eats all of her meals from either Meals on Wheels or something her brother-in-law brings in and there might be more salt than I would like and we did talk about this.  CHRONIC DIASTOLIC HF: She has severe LVH on echo.   *** She seems to be volume overloaded mildly on can have her increase her Lasix to 80 mg.  And then see her back next week for volume management and basic metabolic profile.   AVR:   She saw Dr. Prescott Gum last week and will be followed PRN by [facility].   *** I will facilitate follow-up with Dr.Van Trigt in the future after we get her volume corrected.  CKD Stage IV:  Creatinine was *** has been stable though mildly elevated.  Last creatinine was 2.37.  I will follow this closely.  She is also being followed by nephrology.  AO DISSECTION STATUS POST REPAIR:   This was stable on echo in February.  *** She had stable wound on echo.    MR:  She had mild to moderate MR on echo in February.  ***   COVID EDUCATION: She  has had Covid recently.   Current medicines are reviewed at length with the patient today.  The patient does not have  concerns regarding medicines.  The following changes have been made:  ***  Labs/ tests ordered today include: ***  No orders of the defined types were placed in this encounter.    Disposition:   FU with me in ***   Signed, Minus Breeding, MD  03/16/2020 9:33 PM    [place]

## 2020-03-17 ENCOUNTER — Ambulatory Visit: Payer: Medicare Other | Admitting: Cardiology

## 2020-03-17 DIAGNOSIS — N184 Chronic kidney disease, stage 4 (severe): Secondary | ICD-10-CM

## 2020-03-17 DIAGNOSIS — I10 Essential (primary) hypertension: Secondary | ICD-10-CM

## 2020-03-17 DIAGNOSIS — I340 Nonrheumatic mitral (valve) insufficiency: Secondary | ICD-10-CM

## 2020-03-17 DIAGNOSIS — I5032 Chronic diastolic (congestive) heart failure: Secondary | ICD-10-CM

## 2020-03-17 DIAGNOSIS — I7101 Dissection of thoracic aorta: Secondary | ICD-10-CM

## 2020-03-17 DIAGNOSIS — Z952 Presence of prosthetic heart valve: Secondary | ICD-10-CM

## 2020-03-20 DIAGNOSIS — I5030 Unspecified diastolic (congestive) heart failure: Secondary | ICD-10-CM | POA: Diagnosis not present

## 2020-03-20 DIAGNOSIS — I5033 Acute on chronic diastolic (congestive) heart failure: Secondary | ICD-10-CM | POA: Diagnosis not present

## 2020-03-29 ENCOUNTER — Ambulatory Visit: Payer: Medicare Other | Attending: Internal Medicine

## 2020-03-29 ENCOUNTER — Other Ambulatory Visit: Payer: Self-pay

## 2020-03-29 ENCOUNTER — Telehealth: Payer: Self-pay | Admitting: Internal Medicine

## 2020-03-29 DIAGNOSIS — Z23 Encounter for immunization: Secondary | ICD-10-CM

## 2020-03-29 NOTE — Progress Notes (Signed)
  Chronic Care Management   Outreach Note  03/29/2020 Name: [name] MRN: [id number] DOB: 1945/11/01  Referred by: Hoyt Koch, MD Reason for referral : No chief complaint on file.   An unsuccessful telephone outreach was attempted today. The patient was referred to the pharmacist for assistance with care management and care coordination.   Follow Up Plan:   Carley Perdue [employer] Scheduler

## 2020-03-29 NOTE — Progress Notes (Signed)
   Covid-19 Vaccination Clinic  Name:  Kearia Yin    MRN: [id number] DOB: March 10, 1946  03/29/2020  Ms. Coppock was observed post Covid-19 immunization for 15 minutes without incident. She was provided with Vaccine Information Sheet and instruction to access the V-Safe system.   Ms. Galati was instructed to call 911 with any severe reactions post vaccine: Marland Kitchen Difficulty breathing  . Swelling of face and throat  . A fast heartbeat  . A bad rash all over body  . Dizziness and weakness   Immunizations Administered    Name Date Dose VIS Date Route   Moderna Covid-19 Booster Vaccine 03/29/2020  9:50 AM 0.25 mL 01/26/2020 Intramuscular   Manufacturer: Levan Hurst   Lot: [id number]   [place]: 16122-400-18

## 2020-04-12 ENCOUNTER — Ambulatory Visit: Payer: Medicare Other | Admitting: Family Medicine

## 2020-04-20 DIAGNOSIS — I5033 Acute on chronic diastolic (congestive) heart failure: Secondary | ICD-10-CM | POA: Diagnosis not present

## 2020-04-20 DIAGNOSIS — I5030 Unspecified diastolic (congestive) heart failure: Secondary | ICD-10-CM | POA: Diagnosis not present

## 2020-04-28 ENCOUNTER — Telehealth: Payer: Self-pay | Admitting: Internal Medicine

## 2020-04-28 NOTE — Progress Notes (Signed)
  Chronic Care Management   Note  04/28/2020 Name: Athene Schuhmacher MRN: [id number] DOB: 1945-04-21  Duaa Stelzner is a 75 y.o. year old female who is a primary care patient of Hoyt Koch, MD. I reached out to [name] by phone today in response to a referral sent by Ms. Hassan Rowan Kirkendoll's PCP, Hoyt Koch, MD.   Ms. Rynders was given information about Chronic Care Management services today including:  1. CCM service includes personalized support from designated clinical staff supervised by her physician, including individualized plan of care and coordination with other care providers 2. 24/7 contact phone numbers for assistance for urgent and routine care needs. 3. Service will only be billed when office clinical staff spend 20 minutes or more in a month to coordinate care. 4. Only one practitioner may furnish and bill the service in a calendar month. 5. The patient may stop CCM services at any time (effective at the end of the month) by phone call to the office staff.   Patient agreed to services and verbal consent obtained.   Follow up plan:   Carley Perdue [employer] Scheduler

## 2020-04-30 NOTE — Progress Notes (Deleted)
Cardiology Office Note   Date:  04/30/2020   ID:  [name], DOB 1945-12-23, MRN [id number]  PCP:  Hoyt Koch, MD  Cardiologist:   Minus Breeding, MD   No chief complaint on file.     History of Present Illness: [name] is a 75 y.o. female who presents for evaluation of repaired aortic dissection.  She is status post bioprosthetic AVR and replacement of the ascending thoracic aorta.  She has stage IV CKD and chronic diastolic HF with pleural effusions and edema. She was last in the hospital in February for treatment of COVID 19 pneumonia.  I reviewed all of these hospitalizations for this visit.   Echo at that time demonstrated an EF of 50 - 55%.  She had severe concentric LVH.  The root and aortic valve were stable.    ***  ***  She comes in today and she is having some increased dyspnea.  She says it comes and goes and she was told this is what to expect post Covid.  I do see that she had a chest x-ray in April 22 which showed there was resolution of pneumonia and was some small right pleural effusion.  She did have a BNP that was 1517.  It was suggested that she might increase her Lasix but she did not want to do this until being seen.  She does get short of breath episodically.  She is not describing new PND or orthopnea.  Her oxygen saturation at home is in the mid 90s and today it is 94% at rest.  She does have some increased leg swelling but she is not able to weigh herself daily.  She is not describing chest pressure, neck discomfort or arm discomfort.  She had no palpitations, presyncope or syncope.  She has had no new cough fevers or chills.  Past Medical History:  Diagnosis Date  . Acute pancreatitis 02/04/2017   [name] 02/04/2017  . Adenomatous colon polyp 01/2002  . Anemia, chronic disease    /notes 02/04/2017  . Aortic valve disease    AI/AS  . Arthritis    "lower back, knees" (02/04/2017)  . CHF (congestive heart failure) ([place])   . CKD (chronic  kidney disease), stage III ([place])   . Diverticulosis of colon   . DJD (degenerative joint disease)   . Frequent UTI   . Hemorrhoids   . Hypertension   . Lumbar back pain   . Neuropathy   . Obesity   . Pericardial effusion     in a patient with Diastolic heart  failure and Pericardial effusion  known since last 2 D echo in 11/2016 /notes 02/04/2017  . PONV (postoperative nausea and vomiting)   . Pulmonary HTN ([place])   . Renal cyst   . Venous insufficiency   . Vitamin D deficiency     Past Surgical History:  Procedure Laterality Date  . ABDOMINAL HYSTERECTOMY     "partial"  . AORTIC VALVE REPLACEMENT N/A 05/30/2017   Procedure: AORTIC VALVE REPLACEMENT (AVR);  Surgeon: Prescott Gum, Collier Salina, MD;  Location: [place];  Service: Vascular;  Laterality: N/A;  . COLONOSCOPY W/ BIOPSIES AND POLYPECTOMY     "bx was ok"  . IR THORACENTESIS ASP PLEURAL SPACE W/IMG GUIDE  06/11/2017  . IR THORACENTESIS ASP PLEURAL SPACE W/IMG GUIDE  06/12/2017  . REPAIR OF ACUTE ASCENDING THORACIC AORTIC DISSECTION N/A 05/30/2017   Procedure: REPLACEMENT OF ASCENDING AORTIC ANEURYSM AND REPAIRED CHRONIC ROOT DISSECTION WITH CIRC  ARREST;  Surgeon: Prescott Gum, Collier Salina, MD;  Location: [facility];  Service: Vascular;  Laterality: N/A;  . TEE WITHOUT CARDIOVERSION N/A 05/28/2017   Procedure: TRANSESOPHAGEAL ECHOCARDIOGRAM (TEE);  Surgeon: Skeet Latch, MD;  Location: [place];  Service: Cardiovascular;  Laterality: N/A;  . TEE WITHOUT CARDIOVERSION N/A 05/30/2017   Procedure: TRANSESOPHAGEAL ECHOCARDIOGRAM (TEE);  Surgeon: Prescott Gum, Collier Salina, MD;  Location: [place];  Service: Open Heart Surgery;  Laterality: N/A;     Current Outpatient Medications  Medication Sig Dispense Refill  . amLODipine (NORVASC) 5 MG tablet Take 5 mg by mouth at bedtime.    Marland Kitchen aspirin EC 81 MG tablet Take 81 mg by mouth daily.    . calcitRIOL (ROCALTROL) 0.25 MCG capsule Take 0.25 mcg by mouth See admin instructions. Take one capsule (0.25 mcg) by mouth twice  weekly - Monday and Friday    . CALCIUM CARBONATE ANTACID PO Take 2 tablets by mouth daily as needed for indigestion or heartburn.     . carvedilol (COREG) 25 MG tablet Take 1 tablet (25 mg total) by mouth 2 (two) times daily with a meal.    . fluticasone (FLONASE) 50 MCG/ACT nasal spray Place 2 sprays into both nostrils daily as needed for allergies.    . furosemide (LASIX) 20 MG tablet Take two tablets in the morning and take one tablet in the evening. 270 tablet 3  . gabapentin (NEURONTIN) 100 MG capsule Take 1 capsule (100 mg total) by mouth at bedtime. 90 capsule 0  . hydrocortisone (ANUSOL-HC) 2.5 % rectal cream Place 1 application rectally as needed. 30 g 0  . ondansetron (ZOFRAN) 4 MG tablet Take 1 tablet (4 mg total) by mouth every 8 (eight) hours as needed for nausea or vomiting. 20 tablet 0  . OXYGEN Inhale 2 L into the lungs as needed (shortness of breath).    . potassium chloride SA (K-DUR,KLOR-CON) 20 MEQ tablet Take 20 mEq by mouth daily after lunch.     . predniSONE (DELTASONE) 20 MG tablet Take 1 tablet (20 mg total) by mouth daily with breakfast. 5 tablet 0  . Propylene Glycol (SYSTANE BALANCE) 0.6 % SOLN Place 1 drop into both eyes daily as needed (for dry eyes).    Marland Kitchen spironolactone (ALDACTONE) 25 MG tablet Take 1 tablet (25 mg total) by mouth daily. (Patient taking differently: Take 25 mg by mouth 2 (two) times a week. ) 90 tablet 3  . triamcinolone ointment (KENALOG) 0.5 % Apply 1 application topically daily as needed (itching).     No current facility-administered medications for this visit.    Allergies:   Methylprednisolone, Minoxidil, and Naproxen    ROS:  Please see the history of present illness.   Otherwise, review of systems are positive for ***.   All other systems are reviewed and negative.    PHYSICAL EXAM: VS:  There were no vitals taken for this visit. , BMI There is no height or weight on file to calculate BMI. GENERAL:  Well appearing NECK:  No jugular  venous distention, waveform within normal limits, carotid upstroke brisk and symmetric, no bruits, no thyromegaly LUNGS:  Clear to auscultation bilaterally CHEST:  Unremarkable HEART:  PMI not displaced or sustained,S1 and S2 within normal limits, no S3, no S4, no clicks, no rubs, *** murmurs ABD:  Flat, positive bowel sounds normal in frequency in pitch, no bruits, no rebound, no guarding, no midline pulsatile mass, no hepatomegaly, no splenomegaly EXT:  2 plus pulses throughout, no edema,  no cyanosis no clubbing   ***GEN:  No distress NECK:  No jugular venous distention at 90 degrees, waveform within normal limits, carotid upstroke brisk and symmetric, no bruits, no thyromegaly, is very difficult to visualize the patient's neck veins because she has some muscle contractions and is unable to tilt her head back LYMPHATICS:  No cervical adenopathy LUNGS: Bilateral basilar crackles BACK:  No CVA tenderness CHEST:  Unremarkable HEART:  S1 and S2 within normal limits, no S3, positive S4, no clicks, no rubs, 3 out of 6 apical systolic murmur radiating slightly at the aortic outflow tract murmurs ABD:  Positive bowel sounds normal in frequency in pitch, no bruits, no rebound, no guarding, unable to assess midline mass or bruit with the patient seated. EXT:  2 plus pulses throughout, mild to moderate bilateral leg edema, no cyanosis no clubbing SKIN:  No rashes no nodules NEURO:  Cranial nerves II through XII grossly intact, motor grossly intact throughout PSYCH:  Cognitively intact, oriented to person place and time   EKG:  EKG is *** ordered today. Sinus rhythm, rate ***, left bundle branch block, left axis deviation, no change from previous.   Recent Labs: 06/05/2019: B Natriuretic Peptide 994.8 06/09/2019: Magnesium 2.2 07/29/2019: ALT 12; BUN 53; Creatinine, Ser 2.37; Hemoglobin 8.4 Repeated and verified X2.; Platelets 287.0; Potassium 3.7; Pro B Natriuretic peptide (BNP) 1,517.0; Sodium 140     Lipid Panel    Component Value Date/Time   CHOL 132 02/25/2019 1404   TRIG 90.0 02/25/2019 1404   HDL 42.60 02/25/2019 1404   CHOLHDL 3 02/25/2019 1404   VLDL 18.0 02/25/2019 1404   LDLCALC 72 02/25/2019 1404      Wt Readings from Last 3 Encounters:  03/08/20 173 lb (78.5 kg)  07/29/19 172 lb (78 kg)  06/09/19 186 lb 9.6 oz (84.6 kg)      Other studies Reviewed: Additional studies/ records that were reviewed today include:  ***  Review of the above records demonstrates:  Please see elsewhere in the note.     ASSESSMENT AND PLAN:  HTN:  ***  Slightly elevated but she says it is always in the 140s over 80s.  I will have a low threshold to increase her medications based on future readings in the office and tomorrow complete blood pressure diary at home.   CHRONIC LOWER EXTREMITY EDEMA:   ***  She does have some evidence of volume overload which will be addressed below.  Of also asked her to try to keep her feet elevated.  She eats all of her meals from either Meals on Wheels or something her brother-in-law brings in and there might be more salt than I would like and we did talk about this.  CHRONIC DIASTOLIC HF: ***She seems to be volume overloaded mildly on can have her increase her Lasix to 80 mg.  And then see her back next week for volume management and basic metabolic profile.   AVR:   ***  I will facilitate follow-up with Dr.Van Trigt in the future after we get her volume corrected.  CKD Stage IV:  ***  Creatinine has been stable though mildly elevated.  Last creatinine was 2.37.  I will follow this closely.  She is also being followed by nephrology.  AO DISSECTION STATUS POST REPAIR:   *** She had stable wound on echo.    COVID EDUCATION:   ***  She has had Covid recently.   Current medicines are reviewed at length with the patient  today.  The patient does not have concerns regarding medicines.  The following changes have been made:  no change  Labs/  tests ordered today include:   No orders of the defined types were placed in this encounter.    Disposition:   FU with ***   Signed, Minus Breeding, MD  04/30/2020 11:50 AM    [place]

## 2020-05-02 ENCOUNTER — Ambulatory Visit: Payer: Medicare Other | Admitting: Cardiology

## 2020-05-02 DIAGNOSIS — I5032 Chronic diastolic (congestive) heart failure: Secondary | ICD-10-CM

## 2020-05-02 DIAGNOSIS — I10 Essential (primary) hypertension: Secondary | ICD-10-CM

## 2020-05-21 DIAGNOSIS — I5033 Acute on chronic diastolic (congestive) heart failure: Secondary | ICD-10-CM | POA: Diagnosis not present

## 2020-05-21 DIAGNOSIS — I5030 Unspecified diastolic (congestive) heart failure: Secondary | ICD-10-CM | POA: Diagnosis not present

## 2020-06-06 ENCOUNTER — Telehealth: Payer: Self-pay | Admitting: Pharmacist

## 2020-06-06 NOTE — Progress Notes (Signed)
Chronic Care Management Pharmacy Assistant   Name: [name]  MRN: [id number] DOB: 1945/10/06  Reason for Encounter: Initial Questions   PCP : Hoyt Koch, MD  Allergies:   Allergies  Allergen Reactions  . Methylprednisolone Other (See Comments)    Caused severe GERD in October 2018  . Minoxidil Palpitations and Other (See Comments)    unable to sleep  . Naproxen Swelling    Medications: Outpatient Encounter Medications as of 06/06/2020  Medication Sig  . amLODipine (NORVASC) 5 MG tablet Take 5 mg by mouth at bedtime.  Marland Kitchen aspirin EC 81 MG tablet Take 81 mg by mouth daily.  . calcitRIOL (ROCALTROL) 0.25 MCG capsule Take 0.25 mcg by mouth See admin instructions. Take one capsule (0.25 mcg) by mouth twice weekly - Monday and Friday  . CALCIUM CARBONATE ANTACID PO Take 2 tablets by mouth daily as needed for indigestion or heartburn.   . carvedilol (COREG) 25 MG tablet Take 1 tablet (25 mg total) by mouth 2 (two) times daily with a meal.  . fluticasone (FLONASE) 50 MCG/ACT nasal spray Place 2 sprays into both nostrils daily as needed for allergies.  . furosemide (LASIX) 20 MG tablet Take two tablets in the morning and take one tablet in the evening.  . gabapentin (NEURONTIN) 100 MG capsule Take 1 capsule (100 mg total) by mouth at bedtime.  . hydrocortisone (ANUSOL-HC) 2.5 % rectal cream Place 1 application rectally as needed.  . ondansetron (ZOFRAN) 4 MG tablet Take 1 tablet (4 mg total) by mouth every 8 (eight) hours as needed for nausea or vomiting.  . OXYGEN Inhale 2 L into the lungs as needed (shortness of breath).  . potassium chloride SA (K-DUR,KLOR-CON) 20 MEQ tablet Take 20 mEq by mouth daily after lunch.   . predniSONE (DELTASONE) 20 MG tablet Take 1 tablet (20 mg total) by mouth daily with breakfast.  . Propylene Glycol (SYSTANE BALANCE) 0.6 % SOLN Place 1 drop into both eyes daily as needed (for dry eyes).  Marland Kitchen spironolactone (ALDACTONE) 25 MG tablet Take 1  tablet (25 mg total) by mouth daily. (Patient taking differently: Take 25 mg by mouth 2 (two) times a week. )  . triamcinolone ointment (KENALOG) 0.5 % Apply 1 application topically daily as needed (itching).   No facility-administered encounter medications on file as of 06/06/2020.    Current Diagnosis: Patient Active Problem List   Diagnosis Date Noted  . Nonrheumatic mitral valve regurgitation 03/16/2020  . Acute respiratory disease due to COVID-19 virus 06/05/2019  . Degenerative arthritis of knee, bilateral 04/06/2019  . Encounter for general adult medical examination with abnormal findings 02/26/2019  . Degenerative disc disease, cervical 12/12/2018  . CKD (chronic kidney disease), stage IV ([place]) 08/10/2018  . Leg swelling 11/24/2017  . Bilateral leg weakness 06/26/2017  . History of aortic valve replacement with bioprosthetic valve 05/30/2017  . Ascending aortic dissection ([place])   . Chronic diastolic CHF (congestive heart failure) ([place]) 04/19/2017  . Nonrheumatic aortic valve insufficiency 03/10/2017  . Pulmonary HTN ([place]) 03/10/2017  . Neck pain 02/11/2017  . Pericardial effusion 02/04/2017  . Abnormal bone marrow examination 01/24/2017  . Stenosis of right carotid artery 08/27/2016  . LBBB (left bundle branch block) 08/27/2016  . Left-sided tinnitus 08/16/2016  . Anemia in chronic kidney disease 11/02/2015  . OA (osteoarthritis) of knee 03/23/2012  . Vitamin D deficiency 04/13/2008  . OBESITY 02/23/2008  . Radiculopathy 02/23/2008  . Essential hypertension 02/23/2008  . Venous (peripheral)  insufficiency 02/23/2008    Goals Addressed   None     Follow-Up:  Pharmacist Review   Have you seen any other providers since your last visit? The patient states that she has seen Hulan Saas a few times   Any changes in your medications or health? The patient states that he did change her Gabapentin to 100 mg at bedtime. Also the patient states that she is suppose to take  lasix 20 mg but she states that she is taking 80 mg because it helps get the fluid off and helps her breath better  Any side effects from any medications? The patient states that she is having some numbness in hands not sure if it is from the medication or just nerve damage  Do you have an symptoms or problems not managed by your medications? The patient is not having any symptoms or problems that are not being managed by medications  Any concerns about your health right now? The patient is having some issues with her knees  Has your provider asked that you check blood pressure, blood sugar, or follow special diet at home? The patient states that her blood pressure is up quite often and she is trying to eat healthy, gets meals on wheels  Do you get any type of exercise on a regular basis? The patient states that because of knees she does not exercise much Can you think of a goal you would like to reach for your health? The patient states that her goal is to get her knees to feel better  Do you have any problems getting your medications? The patient states that she has not problems with getting medications from pharmacy, gets them for free  Is there anything that you would like to discuss during the appointment? The patient does not have anything specific to discuss at this time  Patient to have a televisit on 06/09/20, asked to have medications and supplements at appointment    Wendy Poet, [place] [phone]   Time spent:40

## 2020-06-09 ENCOUNTER — Ambulatory Visit (INDEPENDENT_AMBULATORY_CARE_PROVIDER_SITE_OTHER): Payer: Medicare Other | Admitting: Pharmacist

## 2020-06-09 ENCOUNTER — Other Ambulatory Visit: Payer: Self-pay

## 2020-06-09 DIAGNOSIS — I5032 Chronic diastolic (congestive) heart failure: Secondary | ICD-10-CM | POA: Diagnosis not present

## 2020-06-09 DIAGNOSIS — N184 Chronic kidney disease, stage 4 (severe): Secondary | ICD-10-CM

## 2020-06-09 DIAGNOSIS — M170 Bilateral primary osteoarthritis of knee: Secondary | ICD-10-CM | POA: Diagnosis not present

## 2020-06-09 DIAGNOSIS — I10 Essential (primary) hypertension: Secondary | ICD-10-CM

## 2020-06-09 NOTE — Patient Instructions (Signed)
Visit Information  Phone number for Pharmacist: [phone]  Thank you for meeting with me to discuss your medications! I look forward to working with you to achieve your health care goals. Below is a summary of what we talked about during the visit:  Please make appointments to see Dr Sharlet Salina and Dr Hollie Salk at your earliest convenience.  Goals Addressed            This Visit's Progress   . Track and Manage Fluids and Swelling-Heart Failure       Timeframe:  Long-Range Goal Priority:  High Start Date:     06/09/20                        Expected End Date:   06/09/21                    Follow Up Date 01/05/21   - call office if I gain more than 2 pounds in one day or 5 pounds in one week - do ankle pumps when sitting - keep legs up while sitting - use salt in moderation - watch for swelling in feet, ankles and legs every day - weigh myself daily  -check blood pressure 3 times a week and write down in log for providers   Why is this important?    It is important to check your weight daily and watch how much salt and liquids you have.   It will help you to manage your heart failure.    Notes:       Patient Care Plan: CCM Pharmacy Care Plan    Problem Identified: Hypertension, Heart Failure, GERD, Chronic Kidney Disease, Osteoarthritis and Gout   Priority: High    Long-Range Goal: Disease management   Start Date: 06/09/2020  Expected End Date: 12/10/2020  This Visit's Progress: On track  Priority: High  Note:   Current Barriers:  . Unable to independently monitor therapeutic efficacy  Pharmacist Clinical Goal(s):  Marland Kitchen Over the next 180 days, patient will achieve adherence to monitoring guidelines and medication adherence to achieve therapeutic efficacy through collaboration with PharmD and provider.   Interventions: . 1:1 collaboration with Hoyt Koch, MD regarding development and update of comprehensive plan of care as evidenced by provider attestation and  co-signature . Inter-disciplinary care team collaboration (see longitudinal plan of care) . Comprehensive medication review performed; medication list updated in electronic medical record  Heart Failure (Goal: BP < 130/80 and prevent exacerbations) -Not ideally controlled - BP mildly above goal at home per patient report; fluid status is stable on high dose furosemide -s/p AVR, hx aortic dissection (followed by Dr Prescott Gum) -Last ejection fraction: 50-55% (Date: 06/05/2019) -HF type: Diastolic -NYHA Class: II (slight limitation of activity) -Current treatment: . Amlodipine 5 mg daily HS . Carvedilol 25 mg BID . Furosemide 80 mg - 2 tablets daily AM (sometimes takes second dose if short of breath) . Klor-con 20 mEq daily -Medications previously tried: spironolactone   -Current home BP/HR readings: 130s-140s/80s-90s -Educated on Benefits of medications for managing symptoms and prolonging life Importance of weighing daily; if you gain more than 3 pounds in one day or 5 pounds in one week, take extra furosemide dose and contact provider Proper diuretic administration and potassium supplementation Importance of blood pressure control -Recommended to continue current medication  CKD stage 4 (Goal: prevent progression) -Controlled -follows with nephrologist Dr Hollie Salk -Current treatment  . Calcitriol 0.25 mcg twice a week (  MF) -Recommended to continue current medication  Pain (Goal: reduce pain) -Not ideally controlled - pt takes Tylenol more often than gabapentin; she has tried OTC creams; pain is manageable currently but "always there"; pt wonders if she can restart tramadol  -osteoarthritis, degenerative disc disease, radiculopathy -Current treatment  . Gabapentin 100 mg HS prn - infrequently . Tylenol 500 daily - every other day -Medications previously tried: tramadol, hemp oil  -Recommended Voltaren gel for knee arthritis and neck pain -Recommended to continue current medication    GERD (Goal: manage symptoms) -Controlled - pt rarely has symptoms requiring medication -Current treatment  . Tums PRN -Recommended to continue current medication  Gout (Goal: manage flares) -Controlled - pt had first flare Dec 2021 and treated with colchicine -no uric acid on file -Current treatment  . Colchicine 0.6 mg QOD prn flares -Counseled on low-purine diet and avoiding frequent use of colchicine due to kidney failure  Health Maintenance -Current therapy:  . Ondansetron 4 mg PRN . Fluticasone nasal spray PRN . Hydrocortisone rectal cream PRN . Triamcinolone 0.5% ointment PRN . Systane eye drops . Aspirin 81 mg daily . Supplemental O2 (2L) . Vitamin B12 . Multivitamin . [place] - 2 daily -Patient is satisfied with current therapy and denies issues -Recommended to continue current medication  Patient Goals/Self-Care Activities . Over the next 180 days, patient will:  - take medications as prescribed focus on medication adherence by routine check blood pressure 3 times weekly, document, and provide at future appointments weigh daily, and contact provider if weight gain of 3+ lbs overnight or 5+lbs in a week engage in dietary modifications by reducing purine-rich foods (gout)  Follow Up Plan: Telephone follow up appointment with care management team member scheduled for: 6 months      Ms. Pottenger was given information about Chronic Care Management services today including:  1. CCM service includes personalized support from designated clinical staff supervised by her physician, including individualized plan of care and coordination with other care providers 2. 24/7 contact phone numbers for assistance for urgent and routine care needs. 3. Standard insurance, coinsurance, copays and deductibles apply for chronic care management only during months in which we provide at least 20 minutes of these services. Most insurances cover these services at 100%, however patients  may be responsible for any copay, coinsurance and/or deductible if applicable. This service may help you avoid the need for more expensive face-to-face services. 4. Only one practitioner may furnish and bill the service in a calendar month. 5. The patient may stop CCM services at any time (effective at the end of the month) by phone call to the office staff.  Patient agreed to services and verbal consent obtained.   The patient verbalized understanding of instructions, educational materials, and care plan provided today and agreed to receive a mailed copy of patient instructions, educational materials, and care plan.  Telephone follow up appointment with pharmacy team member scheduled for: 6 months  Charlene Brooke, PharmD, [facility] Clinical Pharmacist [facility] [phone]

## 2020-06-09 NOTE — Progress Notes (Signed)
Chronic Care Management Pharmacy Note  06/09/2020 Name:  [name] MRN:  [id number] DOB:  December 25, 1945  Subjective: [name] is an 75 y.o. year old female who is a primary patient of Hoyt Koch, MD.  The CCM team was consulted for assistance with disease management and care coordination needs.    Engaged with patient by telephone for initial visit in response to provider referral for pharmacy case management and/or care coordination services.   Consent to Services:  The patient was given the following information about Chronic Care Management services today, agreed to services, and gave verbal consent: 1. CCM service includes personalized support from designated clinical staff supervised by the primary care provider, including individualized plan of care and coordination with other care providers 2. 24/7 contact phone numbers for assistance for urgent and routine care needs. 3. Service will only be billed when office clinical staff spend 20 minutes or more in a month to coordinate care. 4. Only one practitioner may furnish and bill the service in a calendar month. 5.The patient may stop CCM services at any time (effective at the end of the month) by phone call to the office staff. 6. The patient will be responsible for cost sharing (co-pay) of up to 20% of the service fee (after annual deductible is met). Patient agreed to services and consent obtained.  Patient Care Team: Hoyt Koch, MD as PCP - General (Internal Medicine) Minus Breeding, MD as PCP - Cardiology (Cardiology) Charlton Haws, [facility] as Pharmacist (Pharmacist)  Recent office visits: 07/29/19 Dr Sharlet Salina OV: f/u COVID  Recent consult visits: 03/08/20 Dr Prescott Gum (CT surgery): f/u aortic dissetion, hx bioprosthetic AVR. Stable cardiac status, return PRN.  02/24/20 Dr Hollie Salk (nephrology): first f/u in a year. Consolidate lasix to 160 mg daily. Restart Retacrit for anemia. F/u 3 months.  02/04/20 Dr  Tamala Julian (sports med): reduced gabapentin to 100 mg HS  08/13/19 Dr Percival Spanish (cardiology): f/u dCHF, LE edema. Increase Lasix to 80 mg, RTC 1 week. BP elevated but 140s/80s at home.  Hospital visits: None in previous 6 months  Objective:  Lab Results  Component Value Date   CREATININE 2.37 (H) 07/29/2019   BUN 53 (H) 07/29/2019   GFR 24.24 (L) 07/29/2019   GFRNONAA 19 (L) 06/09/2019   GFRAA 21 (L) 06/09/2019   NA 140 07/29/2019   K 3.7 07/29/2019   CALCIUM 9.6 07/29/2019   CO2 27 07/29/2019    Lab Results  Component Value Date/Time   HGBA1C 5.1 02/25/2019 02:04 PM   HGBA1C 5.3 05/30/2017 02:54 AM   GFR 24.24 (L) 07/29/2019 10:43 AM   GFR 22.10 (L) 02/25/2019 02:04 PM    Last diabetic Eye exam: No results found for: HMDIABEYEEXA  Last diabetic Foot exam: No results found for: HMDIABFOOTEX   Lab Results  Component Value Date   CHOL 132 02/25/2019   HDL 42.60 02/25/2019   LDLCALC 72 02/25/2019   TRIG 90.0 02/25/2019   CHOLHDL 3 02/25/2019    Hepatic Function Latest Ref Rng & Units 07/29/2019 06/09/2019 06/08/2019  Total Protein 6.0 - 8.3 g/dL 7.6 6.3(L) 6.8  Albumin 3.5 - 5.2 g/dL 3.6 2.9(L) 3.0(L)  AST 0 - 37 U/L '19 24 27  ' ALT 0 - 35 U/L '12 23 21  ' Alk Phosphatase 39 - 117 U/L 75 70 74  Total Bilirubin 0.2 - 1.2 mg/dL 0.4 0.6 0.5  Bilirubin, Direct 0.0 - 0.3 mg/dL - - -    Lab Results  Component Value  Date/Time   TSH 0.703 05/29/2017 03:53 AM   TSH 1.17 10/18/2014 12:08 PM   TSH 1.03 01/10/2012 02:04 PM    CBC Latest Ref Rng & Units 07/29/2019 06/09/2019 06/08/2019  WBC 4.0 - 10.5 K/uL 8.0 10.6(H) 9.3  Hemoglobin 12.0 - 15.0 g/dL 8.4 Repeated and verified X2.(L) 10.3(L) 9.6(L)  Hematocrit 36.0 - 46.0 % 25.4 Repeated and verified X2.(L) 32.9(L) 30.6(L)  Platelets 150.0 - 400.0 K/uL 287.0 166 168    Lab Results  Component Value Date/Time   VD25OH 18.21 (L) 10/18/2014 12:08 PM   VD25OH 42 01/10/2012 02:04 PM   VD25OH 15 (L) 03/27/2011 12:11 PM    Clinical ASCVD: No   The 10-year ASCVD risk score Mikey Bussing DC Jr., et al., 2013) is: 13.1%   Values used to calculate the score:     Age: 40 years     Sex: Female     Is Non-Hispanic African American: Yes     Diabetic: No     Tobacco smoker: No     Systolic Blood Pressure: 250 mmHg     Is BP treated: Yes     HDL Cholesterol: 42.6 mg/dL     Total Cholesterol: 132 mg/dL    Depression screen [facility] 2/9 02/25/2019 08/21/2017 06/20/2017  Decreased Interest 0 0 0  Down, Depressed, Hopeless 1 0 0  PHQ - 2 Score 1 0 0  Some recent data might be hidden      Social History   Tobacco Use  Smoking Status Former Smoker  . Packs/day: 0.40  . Years: 15.00  . Pack years: 6.00  . Types: Cigarettes  . Quit date: 04/08/1982  . Years since quitting: 38.1  Smokeless Tobacco Never Used   BP Readings from Last 3 Encounters:  03/08/20 (!) 160/80  02/04/20 130/70  10/19/19 138/86   Pulse Readings from Last 3 Encounters:  03/08/20 71  02/04/20 69  10/19/19 76   Wt Readings from Last 3 Encounters:  03/08/20 173 lb (78.5 kg)  07/29/19 172 lb (78 kg)  06/09/19 186 lb 9.6 oz (84.6 kg)    Assessment/Interventions: Review of patient past medical history, allergies, medications, health status, including review of consultants reports, laboratory and other test data, was performed as part of comprehensive evaluation and provision of chronic care management services.   SDOH:  (Social Determinants of Health) assessments and interventions performed: Yes SDOH Interventions   Flowsheet Row Most Recent Value  SDOH Interventions   Financial Strain Interventions Intervention Not Indicated      CCM Care Plan  Allergies  Allergen Reactions  . Methylprednisolone Other (See Comments)    Caused severe GERD in October 2018  . Minoxidil Palpitations and Other (See Comments)    unable to sleep  . Naproxen Swelling    Medications Reviewed Today    Reviewed by Charlton Haws, [facility] (Pharmacist) on 06/09/20 at 1159  Med List  Status: <None>  Medication Order Taking? Sig Documenting Provider Last Dose Status Informant  amLODipine (NORVASC) 5 MG tablet [id number] Yes Take 5 mg by mouth at bedtime. [provider] Taking Active Self           Med Note Payton Doughty   Sat Jun 05, 2019  2:33 PM)    aspirin EC 81 MG tablet [id number] Yes Take 81 mg by mouth daily. [provider] Taking Active Self  calcitRIOL (ROCALTROL) 0.25 MCG capsule [id number] Yes Take 0.25 mcg by mouth See admin instructions. Take one capsule (0.25 mcg) by  mouth twice weekly - Monday and Friday [provider] Taking Active Multiple Informants  CALCIUM CARBONATE ANTACID PO [id number] Yes Take 2 tablets by mouth daily as needed for indigestion or heartburn.  [provider] Taking Active Self  carvedilol (COREG) 25 MG tablet [id number] Yes Take 1 tablet (25 mg total) by mouth 2 (two) times daily with a meal. Aline August, MD Taking Active Self  Colchicine 0.6 MG CAPS [id number] Yes Take 0.6 mg by mouth as needed. Take every other day as needed during gout flare; not to exceed 3 doses per flare [provider] Taking Active   cyanocobalamin 1000 MCG tablet [id number] Yes Take 1,000 mcg by mouth daily. [provider] Taking Active   fluticasone (FLONASE) 50 MCG/ACT nasal spray [id number] Yes Place 2 sprays into both nostrils daily as needed for allergies. Aline August, MD Taking Active Multiple Informants  furosemide (LASIX) 80 MG tablet [id number] Yes Take 80 mg by mouth 2 (two) times daily. [provider] Taking Active   gabapentin (NEURONTIN) 100 MG capsule [id number] Yes Take 1 capsule (100 mg total) by mouth at bedtime. Lyndal Pulley, DO Taking Active   hydrocortisone (ANUSOL-HC) 2.5 % rectal cream [id number] Yes Place 1 application rectally as needed. Noralee Space, MD Taking Active Multiple Informants           Med Note Alford Highland, MELISSA   Wed Jan 13, 2019  2:32 PM)    Multiple  Vitamin (MULTIVITAMIN) tablet [id number] Yes Take 1 tablet by mouth daily. [provider] Taking Active   ondansetron (ZOFRAN) 4 MG tablet [id number] Yes Take 1 tablet (4 mg total) by mouth every 8 (eight) hours as needed for nausea or vomiting. Aline August, MD Taking Active Multiple Informants  OXYGEN [id number] Yes Inhale 2 L into the lungs as needed (shortness of breath). [provider] Taking Active Self  potassium chloride SA (K-DUR,KLOR-CON) 20 MEQ tablet [id number] Yes Take 20 mEq by mouth daily after lunch.  [provider] Taking Active Self           Med Note Leanord Asal, TREVINA M   Sat Jul 11, 2018 11:12 AM)    Propylene Glycol 0.6 % SOLN [id number] Yes Place 1 drop into both eyes daily as needed (for dry eyes). [provider] Taking Active Self  TART CHERRY PO [id number] Yes Take by mouth. [provider] Taking Active   triamcinolone ointment (KENALOG) 0.5 % [id number] Yes Apply 1 application topically daily as needed (itching). Nani Skillern, PA-C Taking Active Multiple Informants           Med Note De Burrs   Wed Jan 13, 2019  2:32 PM)            Patient Active Problem List   Diagnosis Date Noted  . Nonrheumatic mitral valve regurgitation 03/16/2020  . Acute respiratory disease due to COVID-19 virus 06/05/2019  . Degenerative arthritis of knee, bilateral 04/06/2019  . Encounter for general adult medical examination with abnormal findings 02/26/2019  . Degenerative disc disease, cervical 12/12/2018  . CKD (chronic kidney disease), stage IV ([place]) 08/10/2018  . Leg swelling 11/24/2017  . Bilateral leg weakness 06/26/2017  . History of aortic valve replacement with bioprosthetic valve 05/30/2017  . Ascending aortic dissection ([place])   . Chronic diastolic CHF (congestive heart failure) ([employer]) 04/19/2017  . Nonrheumatic aortic valve insufficiency 03/10/2017  . Pulmonary HTN ([place]) 03/10/2017  . Neck pain 02/11/2017  .  Pericardial effusion 02/04/2017  . Abnormal bone  marrow examination 01/24/2017  . Stenosis of right carotid artery 08/27/2016  . LBBB (left bundle branch block) 08/27/2016  . Left-sided tinnitus 08/16/2016  . Anemia in chronic kidney disease 11/02/2015  . OA (osteoarthritis) of knee 03/23/2012  . Vitamin D deficiency 04/13/2008  . OBESITY 02/23/2008  . Radiculopathy 02/23/2008  . Essential hypertension 02/23/2008  . Venous (peripheral) insufficiency 02/23/2008    Immunization History  Administered Date(s) Administered  . Fluad Quad(high Dose 65+) 01/13/2019  . Influenza Split 01/10/2011, 01/10/2012  . Influenza, High Dose Seasonal PF 01/22/2017  . Influenza,inj,Quad PF,6+ Mos 01/20/2014  . Moderna SARS-COV2 Booster Vaccination 03/29/2020  . Moderna Sars-Covid-2 Vaccination 08/26/2019, 09/23/2019  . Pneumococcal Conjugate-13 02/25/2019  . Pneumococcal Polysaccharide-23 05/25/2014  . Tdap 05/25/2014    Conditions to be addressed/monitored:  Hypertension, Heart Failure, GERD, Chronic Kidney Disease, Osteoarthritis and Gout  Care Plan : CCM Pharmacy Care Plan  Updates made by Charlton Haws, [place] since 06/09/2020 12:00 AM    Problem: Hypertension, Heart Failure, GERD, Chronic Kidney Disease, Osteoarthritis and Gout   Priority: High    Long-Range Goal: Disease management   Start Date: 06/09/2020  Expected End Date: 12/10/2020  This Visit's Progress: On track  Priority: High  Note:   Current Barriers:  . Unable to independently monitor therapeutic efficacy  Pharmacist Clinical Goal(s):  Marland Kitchen Over the next 180 days, patient will achieve adherence to monitoring guidelines and medication adherence to achieve therapeutic efficacy through collaboration with PharmD and provider.   Interventions: . 1:1 collaboration with Hoyt Koch, MD regarding development and update of comprehensive plan of care as evidenced by provider attestation and co-signature . Inter-disciplinary  care team collaboration (see longitudinal plan of care) . Comprehensive medication review performed; medication list updated in electronic medical record  Heart Failure (Goal: BP < 130/80 and prevent exacerbations) -Not ideally controlled - BP mildly above goal at home per patient report; fluid status is stable on high dose furosemide -s/p AVR, hx aortic dissection (followed by Dr Prescott Gum) -Last ejection fraction: 50-55% (Date: 06/05/2019) -HF type: Diastolic -NYHA Class: II (slight limitation of activity) -Current treatment: . Amlodipine 5 mg daily HS . Carvedilol 25 mg BID . Furosemide 80 mg - 2 tablets daily AM (sometimes takes second dose if short of breath) . Klor-con 20 mEq daily -Medications previously tried: spironolactone   -Current home BP/HR readings: 130s-140s/80s-90s -Educated on Benefits of medications for managing symptoms and prolonging life Importance of weighing daily; if you gain more than 3 pounds in one day or 5 pounds in one week, take extra furosemide dose and contact provider Proper diuretic administration and potassium supplementation Importance of blood pressure control -Recommended to continue current medication  CKD stage 4 (Goal: prevent progression) -Controlled -follows with nephrologist Dr Hollie Salk -Current treatment  . Calcitriol 0.25 mcg twice a week (MF) -Recommended to continue current medication  Pain (Goal: reduce pain) -Not ideally controlled - pt takes Tylenol more often than gabapentin; she has tried OTC creams; pain is manageable currently but "always there"; pt wonders if she can restart tramadol  -osteoarthritis, degenerative disc disease, radiculopathy -Current treatment  . Gabapentin 100 mg HS prn - infrequently . Tylenol 500 daily - every other day -Medications previously tried: tramadol, hemp oil  -Recommended Voltaren gel for knee arthritis and neck pain -Recommended to continue current medication   GERD (Goal: manage  symptoms) -Controlled - pt rarely has symptoms requiring medication -Current treatment  . Tums PRN -Recommended to continue current medication  Gout (  Goal: manage flares) -Controlled - pt had first flare Dec 2021 and treated with colchicine -no uric acid on file -Current treatment  . Colchicine 0.6 mg QOD prn flares -Counseled on low-purine diet and avoiding frequent use of colchicine due to kidney failure  Health Maintenance -Current therapy:  . Ondansetron 4 mg PRN . Fluticasone nasal spray PRN . Hydrocortisone rectal cream PRN . Triamcinolone 0.5% ointment PRN . Systane eye drops . Aspirin 81 mg daily . Supplemental O2 (2L) . Vitamin B12 . Multivitamin . [place] - 2 daily -Patient is satisfied with current therapy and denies issues -Recommended to continue current medication  Patient Goals/Self-Care Activities . Over the next 180 days, patient will:  - take medications as prescribed focus on medication adherence by routine check blood pressure 3 times weekly, document, and provide at future appointments weigh daily, and contact provider if weight gain of 3+ lbs overnight or 5+lbs in a week engage in dietary modifications by reducing purine-rich foods (gout)  Follow Up Plan: Telephone follow up appointment with care management team member scheduled for: 6 months      Medication Assistance: None required.  Patient affirms current coverage meets needs.  Patient's preferred pharmacy is:  [facility], [address], [address] [place], [address] [place] [address] Phone: [phone] Fax: [phone]  Uses pill box? No - prefers bottles Pt endorses 100% compliance  We discussed: Current pharmacy is preferred with insurance plan and patient is satisfied with pharmacy services Patient decided to: Continue current medication management strategy  Care Plan and Follow Up Patient Decision:  Patient agrees to Care Plan  and Follow-up.  Plan: Telephone follow up appointment with care management team member scheduled for:  6 months  Charlene Brooke, PharmD, [facility] Clinical Pharmacist [facility] [phone]

## 2020-06-18 DIAGNOSIS — I5033 Acute on chronic diastolic (congestive) heart failure: Secondary | ICD-10-CM | POA: Diagnosis not present

## 2020-06-18 DIAGNOSIS — I5030 Unspecified diastolic (congestive) heart failure: Secondary | ICD-10-CM | POA: Diagnosis not present

## 2020-06-27 ENCOUNTER — Ambulatory Visit (INDEPENDENT_AMBULATORY_CARE_PROVIDER_SITE_OTHER): Payer: Medicare Other

## 2020-06-27 DIAGNOSIS — Z0000 Encounter for general adult medical examination without abnormal findings: Secondary | ICD-10-CM | POA: Diagnosis not present

## 2020-06-27 NOTE — Progress Notes (Signed)
I connected with [name] today by telephone and verified that I am speaking with the correct person using two identifiers. Location patient: home Location provider: work Persons participating in the virtual visit: Delainey Winstanley and Lisette Abu, LPN.   I discussed the limitations, risks, security and privacy concerns of performing an evaluation and management service by telephone and the availability of in person appointments. I also discussed with the patient that there may be a patient responsible charge related to this service. The patient expressed understanding and verbally consented to this telephonic visit.    Interactive audio and video telecommunications were attempted between this provider and patient, however failed, due to patient having technical difficulties OR patient did not have access to video capability.  We continued and completed visit with audio only.  Some vital signs may be absent or patient reported.   Time Spent with patient on telephone encounter: 30 minutes  Subjective:   [name] is a 75 y.o. female who presents for Medicare Annual (Subsequent) preventive examination.  Review of Systems    No ROS. Medicare Wellness Visit. Additional risk factors are reflected in social history. Cardiac Risk Factors include: hypertension;family history of premature cardiovascular disease;advanced age (>9men, >25 women)     Objective:    Today's Vitals   06/27/20 1508  PainSc: 8    There is no height or weight on file to calculate BMI.  Advanced Directives 06/27/2020 06/05/2019 04/06/2018 08/21/2017 07/03/2017 06/25/2017 06/17/2017  Does Patient Have a Medical Advance Directive? No No No No No No No  Would patient like information on creating a medical advance directive? No - Patient declined No - Patient declined No - Patient declined No - Patient declined No - Patient declined No - Patient declined No - Patient declined    Current Medications  (verified) Outpatient Encounter Medications as of 06/27/2020  Medication Sig  . amLODipine (NORVASC) 5 MG tablet Take 5 mg by mouth at bedtime.  Marland Kitchen aspirin EC 81 MG tablet Take 81 mg by mouth daily.  . calcitRIOL (ROCALTROL) 0.25 MCG capsule Take 0.25 mcg by mouth See admin instructions. Take one capsule (0.25 mcg) by mouth twice weekly - Monday and Friday  . CALCIUM CARBONATE ANTACID PO Take 2 tablets by mouth daily as needed for indigestion or heartburn.   . carvedilol (COREG) 25 MG tablet Take 1 tablet (25 mg total) by mouth 2 (two) times daily with a meal.  . Colchicine 0.6 MG CAPS Take 0.6 mg by mouth as needed. Take every other day as needed during gout flare; not to exceed 3 doses per flare  . cyanocobalamin 1000 MCG tablet Take 1,000 mcg by mouth daily.  . fluticasone (FLONASE) 50 MCG/ACT nasal spray Place 2 sprays into both nostrils daily as needed for allergies.  . furosemide (LASIX) 80 MG tablet Take 80 mg by mouth 2 (two) times daily.  Marland Kitchen gabapentin (NEURONTIN) 100 MG capsule Take 1 capsule (100 mg total) by mouth at bedtime.  . hydrocortisone (ANUSOL-HC) 2.5 % rectal cream Place 1 application rectally as needed.  . Multiple Vitamin (MULTIVITAMIN) tablet Take 1 tablet by mouth daily.  . ondansetron (ZOFRAN) 4 MG tablet Take 1 tablet (4 mg total) by mouth every 8 (eight) hours as needed for nausea or vomiting.  . OXYGEN Inhale 2 L into the lungs as needed (shortness of breath).  . potassium chloride SA (K-DUR,KLOR-CON) 20 MEQ tablet Take 20 mEq by mouth daily after lunch.   . Propylene Glycol 0.6 %  SOLN Place 1 drop into both eyes daily as needed (for dry eyes).  Marland Kitchen TART CHERRY PO Take by mouth.  . triamcinolone ointment (KENALOG) 0.5 % Apply 1 application topically daily as needed (itching).   No facility-administered encounter medications on file as of 06/27/2020.    Allergies (verified) Methylprednisolone, Minoxidil, and Naproxen   History: Past Medical History:  Diagnosis Date   . Acute pancreatitis 02/04/2017   [name] 02/04/2017  . Adenomatous colon polyp 01/2002  . Anemia, chronic disease    /notes 02/04/2017  . Aortic valve disease    AI/AS  . Arthritis    "lower back, knees" (02/04/2017)  . CHF (congestive heart failure) ([place])   . CKD (chronic kidney disease), stage III ([place])   . Diverticulosis of colon   . DJD (degenerative joint disease)   . Frequent UTI   . Hemorrhoids   . Hypertension   . Lumbar back pain   . Neuropathy   . Obesity   . Pericardial effusion     in a patient with Diastolic heart  failure and Pericardial effusion  known since last 2 D echo in 11/2016 /notes 02/04/2017  . PONV (postoperative nausea and vomiting)   . Pulmonary HTN ([place])   . Renal cyst   . Venous insufficiency   . Vitamin D deficiency    Past Surgical History:  Procedure Laterality Date  . ABDOMINAL HYSTERECTOMY     "partial"  . AORTIC VALVE REPLACEMENT N/A 05/30/2017   Procedure: AORTIC VALVE REPLACEMENT (AVR);  Surgeon: Prescott Gum, Collier Salina, MD;  Location: [facility];  Service: Vascular;  Laterality: N/A;  . COLONOSCOPY W/ BIOPSIES AND POLYPECTOMY     "bx was ok"  . IR THORACENTESIS ASP PLEURAL SPACE W/IMG GUIDE  06/11/2017  . IR THORACENTESIS ASP PLEURAL SPACE W/IMG GUIDE  06/12/2017  . REPAIR OF ACUTE ASCENDING THORACIC AORTIC DISSECTION N/A 05/30/2017   Procedure: REPLACEMENT OF ASCENDING AORTIC ANEURYSM AND REPAIRED CHRONIC ROOT DISSECTION WITH CIRC ARREST;  Surgeon: Ivin Poot, MD;  Location: [facility];  Service: Vascular;  Laterality: N/A;  . TEE WITHOUT CARDIOVERSION N/A 05/28/2017   Procedure: TRANSESOPHAGEAL ECHOCARDIOGRAM (TEE);  Surgeon: Skeet Latch, MD;  Location: [facility];  Service: Cardiovascular;  Laterality: N/A;  . TEE WITHOUT CARDIOVERSION N/A 05/30/2017   Procedure: TRANSESOPHAGEAL ECHOCARDIOGRAM (TEE);  Surgeon: Prescott Gum, Collier Salina, MD;  Location: [facility];  Service: Open Heart Surgery;  Laterality: N/A;   Family History  Problem Relation Age of  Onset  . Diabetes Mother   . Colon polyps Mother   . Hypertension Mother   . Hypertension Brother   . Hypertension Sister   . Colon cancer Neg Hx    Social History   Socioeconomic History  . Marital status: Married    Spouse name: Juanda Crumble  . Number of children: 1  . Years of education: 52  . Highest education level: Not on file  Occupational History  . Occupation: retired from Administrator, arts  Tobacco Use  . Smoking status: Former Smoker    Packs/day: 0.40    Years: 15.00    Pack years: 6.00    Types: Cigarettes    Quit date: 04/08/1982    Years since quitting: 38.2  . Smokeless tobacco: Never Used  Vaping Use  . Vaping Use: Never used  Substance and Sexual Activity  . Alcohol use: No  . Drug use: No  . Sexual activity: Never  Other Topics Concern  . Not on file  Social History Narrative  Lives at home w/ her husband   Right-handed   Daily caffeine    Social Determinants of Health   Financial Resource Strain: Low Risk   . Difficulty of Paying Living Expenses: Not hard at all  Food Insecurity: No Food Insecurity  . Worried About Charity fundraiser in the Last Year: Never true  . Ran Out of Food in the Last Year: Never true  Transportation Needs: No Transportation Needs  . Lack of Transportation (Medical): No  . Lack of Transportation (Non-Medical): No  Physical Activity: Inactive  . Days of Exercise per Week: 0 days  . Minutes of Exercise per Session: 0 min  Stress: No Stress Concern Present  . Feeling of Stress : Not at all  Social Connections: Not on file    Tobacco Counseling Counseling given: Not Answered   Clinical Intake:  Pre-visit preparation completed: Yes  Pain : 0-10 Pain Score: 8  Pain Location: Knee Pain Orientation: Left,Right Pain Radiating Towards: n/a Pain Descriptors / Indicators: Aching,Constant,Discomfort,Dull,Throbbing Pain Onset: More than a month ago Pain Frequency: Constant Pain Relieving Factors: Tylenol and  Gabapentin Effect of Pain on Daily Activities: Pain produces disability and affects the quality of life.  Pain Relieving Factors: Tylenol and Gabapentin  Nutritional Risks: None Diabetes: No  How often do you need to have someone help you when you read instructions, pamphlets, or other written materials from your doctor or pharmacy?: 1 - Never What is the last grade level you completed in school?: High School Graduate  Diabetic? no  Interpreter Needed?: No  Information entered by :: Lisette Abu, LPN   Activities of Daily Living In your present state of health, do you have any difficulty performing the following activities: 06/27/2020  Hearing? N  Vision? N  Difficulty concentrating or making decisions? N  Walking or climbing stairs? Y  Comment due to bilateral knee pain  Dressing or bathing? N  Doing errands, shopping? N  Preparing Food and eating ? Y  Using the Toilet? N  In the past six months, have you accidently leaked urine? Y  Do you have problems with loss of bowel control? N  Managing your Medications? N  Managing your Finances? N  Housekeeping or managing your Housekeeping? Y  Some recent data might be hidden    Patient Care Team: Hoyt Koch, MD as PCP - General (Internal Medicine) Minus Breeding, MD as PCP - Cardiology (Cardiology) Charlton Haws, [facility] as Pharmacist (Pharmacist)  Indicate any recent Medical Services you may have received from other than [facility] providers in the past year (date may be approximate).     Assessment:   This is a routine wellness examination for [name].  Hearing/Vision screen No exam data present  Dietary issues and exercise activities discussed: Current Exercise Habits: The patient does not participate in regular exercise at present, Exercise limited by: orthopedic condition(s);cardiac condition(s);respiratory conditions(s)  Goals    . Track and Manage Fluids and Swelling-Heart Failure     Timeframe:   Long-Range Goal Priority:  High Start Date:     06/09/20                        Expected End Date:   06/09/21                    Follow Up Date 01/05/21   - call office if I gain more than 2 pounds in one day or 5 pounds in one  week - do ankle pumps when sitting - keep legs up while sitting - use salt in moderation - watch for swelling in feet, ankles and legs every day - weigh myself daily  -check blood pressure 3 times a week and write down in log for providers   Why is this important?    It is important to check your weight daily and watch how much salt and liquids you have.   It will help you to manage your heart failure.    Notes:       Depression Screen PHQ 2/9 Scores 06/27/2020 02/25/2019 08/21/2017 06/20/2017 01/22/2017 12/08/2015 06/29/2012  PHQ - 2 Score 0 1 0 0 0 0 1    Fall Risk Fall Risk  06/27/2020 07/29/2019 02/25/2019 09/09/2017 09/02/2017  Falls in the past year? 0 0 0 (No Data) (No Data)  Comment - - - patient denies new/ recent falls Denies new/ recent falls  Number falls in past yr: 0 0 - - -  Injury with Fall? 0 0 - - -  Risk for fall due to : No Fall Risks - - - -  Risk for fall due to: Comment - - - - -  Follow up Falls evaluation completed - - - -    FALL RISK PREVENTION PERTAINING TO THE HOME:  Any stairs in or around the home? No  If so, are there any without handrails? No  Home free of loose throw rugs in walkways, pet beds, electrical cords, etc? Yes  Adequate lighting in your home to reduce risk of falls? Yes   ASSISTIVE DEVICES UTILIZED TO PREVENT FALLS:  Life alert? No  Use of a cane, walker or w/c? Yes  Grab bars in the bathroom? No  Shower chair or bench in shower? No  Elevated toilet seat or a handicapped toilet? Yes   TIMED UP AND GO:  Was the test performed? No .  Length of time to ambulate 10 feet: 0 sec.   Gait slow and steady with assistive device  Cognitive Function: No flowsheet data found.          Immunizations Immunization History  Administered Date(s) Administered  . Fluad Quad(high Dose 65+) 01/13/2019  . Influenza Split 01/10/2011, 01/10/2012  . Influenza, High Dose Seasonal PF 01/22/2017  . Influenza,inj,Quad PF,6+ Mos 01/20/2014  . Moderna SARS-COV2 Booster Vaccination 03/29/2020  . Moderna Sars-Covid-2 Vaccination 08/26/2019, 09/23/2019  . Pneumococcal Conjugate-13 02/25/2019  . Pneumococcal Polysaccharide-23 05/25/2014  . Tdap 05/25/2014    TDAP status: Up to date  Flu Vaccine status: Up to date  Pneumococcal vaccine status: Up to date  Covid-19 vaccine status: Completed vaccines  Qualifies for Shingles Vaccine? Yes   Zostavax completed No   Shingrix Completed?: No.    Education has been provided regarding the importance of this vaccine. Patient has been advised to call insurance company to determine out of pocket expense if they have not yet received this vaccine. Advised may also receive vaccine at local pharmacy or Health Dept. Verbalized acceptance and understanding.  Screening Tests Health Maintenance  Topic Date Due  . Hepatitis C Screening  Never done  . MAMMOGRAM  02/03/2014  . COLONOSCOPY (Pts 45-38yrs Insurance coverage will need to be confirmed)  02/18/2015  . TETANUS/TDAP  05/25/2024  . INFLUENZA VACCINE  Completed  . DEXA SCAN  Completed  . COVID-19 Vaccine  Completed  . PNA vac Low Risk Adult  Completed  . HPV VACCINES  Aged Out    Health Maintenance  Health Maintenance Due  Topic Date Due  . Hepatitis C Screening  Never done  . MAMMOGRAM  02/03/2014  . COLONOSCOPY (Pts 45-52yrs Insurance coverage will need to be confirmed)  02/18/2015    Colorectal cancer screening: Type of screening: Colonoscopy. Completed 02/18/2012. Repeat every 3 years  Mammogram status: Completed 02/04/2012. Repeat every year  Bone Density status: Completed 01/01/2012. Results reflect: Bone density results: NORMAL. Repeat every 0 years.  Lung Cancer  Screening: (Low Dose CT Chest recommended if Age 48-80 years, 30 pack-year currently smoking OR have quit w/in 15years.) does not qualify.   Lung Cancer Screening Referral: no  Additional Screening:  Hepatitis C Screening: does not qualify; Completed no  Vision Screening: Recommended annual ophthalmology exams for early detection of glaucoma and other disorders of the eye. Is the patient up to date with their annual eye exam?  Yes  Who is the provider or what is the name of the office in which the patient attends annual eye exams? [facility] If pt is not established with a provider, would they like to be referred to a provider to establish care? No .   Dental Screening: Recommended annual dental exams for proper oral hygiene  Community Resource Referral / Chronic Care Management: CRR required this visit?  No   CCM required this visit?  No      Plan:     I have personally reviewed and noted the following in the patient's chart:   . Medical and social history . Use of alcohol, tobacco or illicit drugs  . Current medications and supplements . Functional ability and status . Nutritional status . Physical activity . Advanced directives . List of other physicians . Hospitalizations, surgeries, and ER visits in previous 12 months . Vitals . Screenings to include cognitive, depression, and falls . Referrals and appointments  In addition, I have reviewed and discussed with patient certain preventive protocols, quality metrics, and best practice recommendations. A written personalized care plan for preventive services as well as general preventive health recommendations were provided to patient.     Sheral Flow, LPN   1/61/0960   Nurse Notes:  Patient is cogitatively intact. There were no vitals filed for this visit. There is no height or weight on file to calculate BMI. Medications reviewed with patient; no opioid use noted.

## 2020-06-27 NOTE — Patient Instructions (Signed)
[name] , Thank you for taking time to come for your Medicare Wellness Visit. I appreciate your ongoing commitment to your health goals. Please review the following plan we discussed and let me know if I can assist you in the future.   Screening recommendations/referrals: Colonoscopy: 02/18/2012; due every 3 years Mammogram: 02/04/2012; due every 1-2 years Bone Density: 01/01/2012; completed Recommended yearly ophthalmology/optometry visit for glaucoma screening and checkup Recommended yearly dental visit for hygiene and checkup  Vaccinations: Influenza vaccine: 02/24/2020 Pneumococcal vaccine: 05/25/2014, 02/25/2019 Tdap vaccine: 05/25/2014 Shingles vaccine: never done; can check with local pharmacy    Covid-19: 08/26/2019, 09/23/2019, 03/29/2020  Advanced directives: Advance directive discussed with you today. Even though you declined this today please call our office should you change your mind and we can give you the proper paperwork for you to fill out.  Conditions/risks identified: Yes. Reviewed health maintenance screenings with patient today and relevant education, vaccines, and/or referrals were provided. Continue doing brain stimulating activities (puzzles, reading, adult coloring books, staying active) to keep memory sharp. Continue to eat heart healthy diet (full of fruits, vegetables, whole grains, lean protein, water--limit salt, fat, and sugar intake) and increase physical activity as tolerated.   Next appointment:  Please schedule your next Medicare Wellness Visit with your Nurse Health Advisor in 1 year by calling [phone].   Preventive Care 75 Years and Older, Female Preventive care refers to lifestyle choices and visits with your health care provider that can promote health and wellness. What does preventive care include?  A yearly physical exam. This is also called an annual well check.  Dental exams once or twice a year.  Routine eye exams. Ask your health care  provider how often you should have your eyes checked.  Personal lifestyle choices, including:  Daily care of your teeth and gums.  Regular physical activity.  Eating a healthy diet.  Avoiding tobacco and drug use.  Limiting alcohol use.  Practicing safe sex.  Taking low-dose aspirin every day.  Taking vitamin and mineral supplements as recommended by your health care provider. What happens during an annual well check? The services and screenings done by your health care provider during your annual well check will depend on your age, overall health, lifestyle risk factors, and family history of disease. Counseling  Your health care provider may ask you questions about your:  Alcohol use.  Tobacco use.  Drug use.  Emotional well-being.  Home and relationship well-being.  Sexual activity.  Eating habits.  History of falls.  Memory and ability to understand (cognition).  Work and work Statistician.  Reproductive health. Screening  You may have the following tests or measurements:  Height, weight, and BMI.  Blood pressure.  Lipid and cholesterol levels. These may be checked every 5 years, or more frequently if you are over 25 years old.  Skin check.  Lung cancer screening. You may have this screening every year starting at age 26 if you have a 30-pack-year history of smoking and currently smoke or have quit within the past 15 years.  Fecal occult blood test (FOBT) of the stool. You may have this test every year starting at age 28.  Flexible sigmoidoscopy or colonoscopy. You may have a sigmoidoscopy every 5 years or a colonoscopy every 10 years starting at age 48.  Hepatitis C blood test.  Hepatitis B blood test.  Sexually transmitted disease (STD) testing.  Diabetes screening. This is done by checking your blood sugar (glucose) after you have not eaten  for a while (fasting). You may have this done every 1-3 years.  Bone density scan. This is done to  screen for osteoporosis. You may have this done starting at age 19.  Mammogram. This may be done every 1-2 years. Talk to your health care provider about how often you should have regular mammograms. Talk with your health care provider about your test results, treatment options, and if necessary, the need for more tests. Vaccines  Your health care provider may recommend certain vaccines, such as:  Influenza vaccine. This is recommended every year.  Tetanus, diphtheria, and acellular pertussis (Tdap, Td) vaccine. You may need a Td booster every 10 years.  Zoster vaccine. You may need this after age 32.  Pneumococcal 13-valent conjugate (PCV13) vaccine. One dose is recommended after age 59.  Pneumococcal polysaccharide (PPSV23) vaccine. One dose is recommended after age 45. Talk to your health care provider about which screenings and vaccines you need and how often you need them. This information is not intended to replace advice given to you by your health care provider. Make sure you discuss any questions you have with your health care provider. Document Released: 04/21/2015 Document Revised: 12/13/2015 Document Reviewed: 01/24/2015 Elsevier Interactive Patient Education  2017 [place] Prevention in the Home Falls can cause injuries. They can happen to people of all ages. There are many things you can do to make your home safe and to help prevent falls. What can I do on the outside of my home?  Regularly fix the edges of walkways and driveways and fix any cracks.  Remove anything that might make you trip as you walk through a door, such as a raised step or threshold.  Trim any bushes or trees on the path to your home.  Use bright outdoor lighting.  Clear any walking paths of anything that might make someone trip, such as rocks or tools.  Regularly check to see if handrails are loose or broken. Make sure that both sides of any steps have handrails.  Any raised decks  and porches should have guardrails on the edges.  Have any leaves, snow, or ice cleared regularly.  Use sand or salt on walking paths during winter.  Clean up any spills in your garage right away. This includes oil or grease spills. What can I do in the bathroom?  Use night lights.  Install grab bars by the toilet and in the tub and shower. Do not use towel bars as grab bars.  Use non-skid mats or decals in the tub or shower.  If you need to sit down in the shower, use a plastic, non-slip stool.  Keep the floor dry. Clean up any water that spills on the floor as soon as it happens.  Remove soap buildup in the tub or shower regularly.  Attach bath mats securely with double-sided non-slip rug tape.  Do not have throw rugs and other things on the floor that can make you trip. What can I do in the bedroom?  Use night lights.  Make sure that you have a light by your bed that is easy to reach.  Do not use any sheets or blankets that are too big for your bed. They should not hang down onto the floor.  Have a firm chair that has side arms. You can use this for support while you get dressed.  Do not have throw rugs and other things on the floor that can make you trip. What can I do  in the kitchen?  Clean up any spills right away.  Avoid walking on wet floors.  Keep items that you use a lot in easy-to-reach places.  If you need to reach something above you, use a strong step stool that has a grab bar.  Keep electrical cords out of the way.  Do not use floor polish or wax that makes floors slippery. If you must use wax, use non-skid floor wax.  Do not have throw rugs and other things on the floor that can make you trip. What can I do with my stairs?  Do not leave any items on the stairs.  Make sure that there are handrails on both sides of the stairs and use them. Fix handrails that are broken or loose. Make sure that handrails are as long as the stairways.  Check any  carpeting to make sure that it is firmly attached to the stairs. Fix any carpet that is loose or worn.  Avoid having throw rugs at the top or bottom of the stairs. If you do have throw rugs, attach them to the floor with carpet tape.  Make sure that you have a light switch at the top of the stairs and the bottom of the stairs. If you do not have them, ask someone to add them for you. What else can I do to help prevent falls?  Wear shoes that:  Do not have high heels.  Have rubber bottoms.  Are comfortable and fit you well.  Are closed at the toe. Do not wear sandals.  If you use a stepladder:  Make sure that it is fully opened. Do not climb a closed stepladder.  Make sure that both sides of the stepladder are locked into place.  Ask someone to hold it for you, if possible.  Clearly mark and make sure that you can see:  Any grab bars or handrails.  First and last steps.  Where the edge of each step is.  Use tools that help you move around (mobility aids) if they are needed. These include:  Canes.  Walkers.  Scooters.  Crutches.  Turn on the lights when you go into a dark area. Replace any light bulbs as soon as they burn out.  Set up your furniture so you have a clear path. Avoid moving your furniture around.  If any of your floors are uneven, fix them.  If there are any pets around you, be aware of where they are.  Review your medicines with your doctor. Some medicines can make you feel dizzy. This can increase your chance of falling. Ask your doctor what other things that you can do to help prevent falls. This information is not intended to replace advice given to you by your health care provider. Make sure you discuss any questions you have with your health care provider. Document Released: 01/19/2009 Document Revised: 08/31/2015 Document Reviewed: 04/29/2014 Elsevier Interactive Patient Education  2017 [employer].

## 2020-07-19 DIAGNOSIS — I5033 Acute on chronic diastolic (congestive) heart failure: Secondary | ICD-10-CM | POA: Diagnosis not present

## 2020-07-19 DIAGNOSIS — I5030 Unspecified diastolic (congestive) heart failure: Secondary | ICD-10-CM | POA: Diagnosis not present

## 2020-08-17 ENCOUNTER — Telehealth: Payer: Self-pay | Admitting: Pharmacist

## 2020-08-17 NOTE — Progress Notes (Signed)
Chronic Care Management Pharmacy Assistant   Name: [name]  MRN: [id number] DOB: 06-05-1945   Reason for Encounter: Disease State - Hypertension    Recent office visits:  None noted.  Recent consult visits:  None noted.  Hospital visits:  None in previous 6 months  Medications: Outpatient Encounter Medications as of 08/17/2020  Medication Sig  . amLODipine (NORVASC) 5 MG tablet Take 5 mg by mouth at bedtime.  Marland Kitchen aspirin EC 81 MG tablet Take 81 mg by mouth daily.  . calcitRIOL (ROCALTROL) 0.25 MCG capsule Take 0.25 mcg by mouth See admin instructions. Take one capsule (0.25 mcg) by mouth twice weekly - Monday and Friday  . CALCIUM CARBONATE ANTACID PO Take 2 tablets by mouth daily as needed for indigestion or heartburn.   . carvedilol (COREG) 25 MG tablet Take 1 tablet (25 mg total) by mouth 2 (two) times daily with a meal.  . Colchicine 0.6 MG CAPS Take 0.6 mg by mouth as needed. Take every other day as needed during gout flare; not to exceed 3 doses per flare  . cyanocobalamin 1000 MCG tablet Take 1,000 mcg by mouth daily.  . fluticasone (FLONASE) 50 MCG/ACT nasal spray Place 2 sprays into both nostrils daily as needed for allergies.  . furosemide (LASIX) 80 MG tablet Take 80 mg by mouth 2 (two) times daily.  Marland Kitchen gabapentin (NEURONTIN) 100 MG capsule Take 1 capsule (100 mg total) by mouth at bedtime.  . hydrocortisone (ANUSOL-HC) 2.5 % rectal cream Place 1 application rectally as needed.  . Multiple Vitamin (MULTIVITAMIN) tablet Take 1 tablet by mouth daily.  . ondansetron (ZOFRAN) 4 MG tablet Take 1 tablet (4 mg total) by mouth every 8 (eight) hours as needed for nausea or vomiting.  . OXYGEN Inhale 2 L into the lungs as needed (shortness of breath).  . potassium chloride SA (K-DUR,KLOR-CON) 20 MEQ tablet Take 20 mEq by mouth daily after lunch.   . Propylene Glycol 0.6 % SOLN Place 1 drop into both eyes daily as needed (for dry eyes).  Marland Kitchen TART CHERRY PO Take by mouth.  .  triamcinolone ointment (KENALOG) 0.5 % Apply 1 application topically daily as needed (itching).   No facility-administered encounter medications on file as of 08/17/2020.    Reviewed chart prior to disease state call. Spoke with patient regarding BP  Recent Office Vitals: BP Readings from Last 3 Encounters:  03/08/20 (!) 160/80  02/04/20 130/70  10/19/19 138/86   Pulse Readings from Last 3 Encounters:  03/08/20 71  02/04/20 69  10/19/19 76    Wt Readings from Last 3 Encounters:  03/08/20 173 lb (78.5 kg)  07/29/19 172 lb (78 kg)  06/09/19 186 lb 9.6 oz (84.6 kg)     Kidney Function Lab Results  Component Value Date/Time   CREATININE 2.37 (H) 07/29/2019 10:43 AM   CREATININE 2.49 (H) 06/09/2019 03:40 AM   CREATININE 3.1 (HH) 03/05/2017 12:38 PM   GFR 24.24 (L) 07/29/2019 10:43 AM   GFRNONAA 19 (L) 06/09/2019 03:40 AM   GFRAA 21 (L) 06/09/2019 03:40 AM    BMP Latest Ref Rng & Units 07/29/2019 06/09/2019 06/08/2019  Glucose 70 - 99 mg/dL 98 118(H) 106(H)  BUN 6 - 23 mg/dL 53(H) 92(H) 79(H)  Creatinine 0.40 - 1.20 mg/dL 2.37(H) 2.49(H) 2.61(H)  BUN/Creat Ratio 12 - 28 - - -  Sodium 135 - 145 mEq/L 140 135 138  Potassium 3.5 - 5.1 mEq/L 3.7 4.0 3.9  Chloride 96 - 112  mEq/L 104 99 101  CO2 19 - 32 mEq/L 27 24 24   Calcium 8.4 - 10.5 mg/dL 9.6 8.9 9.2    . Current antihypertensive regimen:   Amlodipine 5 mg daily HS  Carvedilol 25 mg BID  . How often are you checking your Blood Pressure? infrequently  .  Marland Kitchen Current home BP readings:   Patient states she doesn't record her readings and her last  reading was 130/82. .  . What recent interventions/DTPs have been made by any provider to improve Blood Pressure control since last CPP Visit:    None noted.  . Any recent hospitalizations or ED visits since last visit with CPP? No   . What diet changes have been made to improve Blood Pressure Control?  Patient states she doesn't eat a lot and use meal on wheels for lunch.    . What exercise is being done to improve your Blood Pressure Control?  o Patient states she doesn't exercise due to her Knee pain.  Adherence Review: Is the patient currently on ACE/ARB medication? No Does the patient have >5 day gap between last estimated fill dates? No  Amlodipine - 04/05/20 90D 06/07/20   (Patient states her last fill was 06/13/20 and next fill is 08/15/20 but she has 1 bottle left)  Carvedilol - 04/05/20 90D   (Patient states her last fill was 06/13/20 and she has no refills left)  Patient states she was asked in the past about using [facility] and said no, she states she may be interested in using packaging from [facility] now.  Star Rating Drugs: N/A  Orinda Kenner, RMA Clinical Pharmacists Assistant [phone]  Time Spent: 59

## 2020-08-18 DIAGNOSIS — I5033 Acute on chronic diastolic (congestive) heart failure: Secondary | ICD-10-CM | POA: Diagnosis not present

## 2020-08-18 DIAGNOSIS — I5030 Unspecified diastolic (congestive) heart failure: Secondary | ICD-10-CM | POA: Diagnosis not present

## 2020-09-06 ENCOUNTER — Telehealth: Payer: Self-pay | Admitting: Pharmacist

## 2020-09-06 NOTE — Progress Notes (Signed)
Called and spoke with patient in reference to switching to [facility] for packaging and she stated since there will be a $8 co-pay she would like to stay with the mail order for now since she has $0 co-pay.  Orinda Kenner, [place] Clinical Pharmacists Assistant [phone]  Time Spent: 5

## 2020-09-06 NOTE — Progress Notes (Addendum)
error 

## 2020-09-18 DIAGNOSIS — I5033 Acute on chronic diastolic (congestive) heart failure: Secondary | ICD-10-CM | POA: Diagnosis not present

## 2020-09-18 DIAGNOSIS — I5030 Unspecified diastolic (congestive) heart failure: Secondary | ICD-10-CM | POA: Diagnosis not present

## 2020-10-18 DIAGNOSIS — I5033 Acute on chronic diastolic (congestive) heart failure: Secondary | ICD-10-CM | POA: Diagnosis not present

## 2020-10-18 DIAGNOSIS — I5030 Unspecified diastolic (congestive) heart failure: Secondary | ICD-10-CM | POA: Diagnosis not present

## 2020-11-08 ENCOUNTER — Telehealth: Payer: Self-pay | Admitting: Family Medicine

## 2020-11-08 NOTE — Telephone Encounter (Signed)
Patient called stating that she is having very bad neck cramps. She said that it grabs at the base of her head and she is having trouble every time she turns her head. She has tried muscle relaxers but they do not seem to be helping.  Please advise any other options? (Offered her an appointment on Monday but she did not think she would be able to make it here)

## 2020-11-10 NOTE — Telephone Encounter (Signed)
Left message for patient to call back for recommendations.  

## 2020-11-10 NOTE — Telephone Encounter (Signed)
Spoke to patient

## 2020-11-18 DIAGNOSIS — I5030 Unspecified diastolic (congestive) heart failure: Secondary | ICD-10-CM | POA: Diagnosis not present

## 2020-11-18 DIAGNOSIS — I5033 Acute on chronic diastolic (congestive) heart failure: Secondary | ICD-10-CM | POA: Diagnosis not present

## 2020-11-19 ENCOUNTER — Emergency Department (HOSPITAL_COMMUNITY): Payer: Medicare Other

## 2020-11-19 ENCOUNTER — Encounter (HOSPITAL_COMMUNITY): Payer: Self-pay | Admitting: *Deleted

## 2020-11-19 ENCOUNTER — Other Ambulatory Visit: Payer: Self-pay

## 2020-11-19 ENCOUNTER — Inpatient Hospital Stay (HOSPITAL_COMMUNITY)
Admission: EM | Admit: 2020-11-19 | Discharge: 2020-11-30 | DRG: 682 | Disposition: A | Discharge status: Skilled Nursing Facility | Payer: Medicare Other | Attending: Internal Medicine | Admitting: Internal Medicine

## 2020-11-19 DIAGNOSIS — I272 Pulmonary hypertension, unspecified: Secondary | ICD-10-CM | POA: Diagnosis not present

## 2020-11-19 DIAGNOSIS — D631 Anemia in chronic kidney disease: Secondary | ICD-10-CM | POA: Diagnosis present

## 2020-11-19 DIAGNOSIS — Z20822 Contact with and (suspected) exposure to covid-19: Secondary | ICD-10-CM | POA: Diagnosis present

## 2020-11-19 DIAGNOSIS — Z7401 Bed confinement status: Secondary | ICD-10-CM | POA: Diagnosis not present

## 2020-11-19 DIAGNOSIS — L8990 Pressure ulcer of unspecified site, unspecified stage: Secondary | ICD-10-CM | POA: Insufficient documentation

## 2020-11-19 DIAGNOSIS — E1140 Type 2 diabetes mellitus with diabetic neuropathy, unspecified: Secondary | ICD-10-CM | POA: Diagnosis not present

## 2020-11-19 DIAGNOSIS — L89310 Pressure ulcer of right buttock, unstageable: Secondary | ICD-10-CM | POA: Diagnosis present

## 2020-11-19 DIAGNOSIS — E870 Hyperosmolality and hypernatremia: Secondary | ICD-10-CM | POA: Diagnosis present

## 2020-11-19 DIAGNOSIS — G629 Polyneuropathy, unspecified: Secondary | ICD-10-CM | POA: Diagnosis not present

## 2020-11-19 DIAGNOSIS — Z6831 Body mass index (BMI) 31.0-31.9, adult: Secondary | ICD-10-CM

## 2020-11-19 DIAGNOSIS — Z888 Allergy status to other drugs, medicaments and biological substances status: Secondary | ICD-10-CM

## 2020-11-19 DIAGNOSIS — N184 Chronic kidney disease, stage 4 (severe): Secondary | ICD-10-CM | POA: Diagnosis present

## 2020-11-19 DIAGNOSIS — I509 Heart failure, unspecified: Secondary | ICD-10-CM | POA: Diagnosis present

## 2020-11-19 DIAGNOSIS — E559 Vitamin D deficiency, unspecified: Secondary | ICD-10-CM | POA: Diagnosis not present

## 2020-11-19 DIAGNOSIS — M549 Dorsalgia, unspecified: Secondary | ICD-10-CM | POA: Diagnosis not present

## 2020-11-19 DIAGNOSIS — I10 Essential (primary) hypertension: Secondary | ICD-10-CM | POA: Diagnosis not present

## 2020-11-19 DIAGNOSIS — Z8616 Personal history of COVID-19: Secondary | ICD-10-CM | POA: Diagnosis not present

## 2020-11-19 DIAGNOSIS — Z7189 Other specified counseling: Secondary | ICD-10-CM | POA: Diagnosis not present

## 2020-11-19 DIAGNOSIS — E46 Unspecified protein-calorie malnutrition: Secondary | ICD-10-CM | POA: Diagnosis not present

## 2020-11-19 DIAGNOSIS — M1712 Unilateral primary osteoarthritis, left knee: Secondary | ICD-10-CM | POA: Diagnosis not present

## 2020-11-19 DIAGNOSIS — D649 Anemia, unspecified: Secondary | ICD-10-CM

## 2020-11-19 DIAGNOSIS — E1122 Type 2 diabetes mellitus with diabetic chronic kidney disease: Secondary | ICD-10-CM | POA: Diagnosis present

## 2020-11-19 DIAGNOSIS — I872 Venous insufficiency (chronic) (peripheral): Secondary | ICD-10-CM | POA: Diagnosis not present

## 2020-11-19 DIAGNOSIS — M109 Gout, unspecified: Secondary | ICD-10-CM | POA: Diagnosis not present

## 2020-11-19 DIAGNOSIS — N281 Cyst of kidney, acquired: Secondary | ICD-10-CM | POA: Diagnosis not present

## 2020-11-19 DIAGNOSIS — I5032 Chronic diastolic (congestive) heart failure: Secondary | ICD-10-CM | POA: Diagnosis not present

## 2020-11-19 DIAGNOSIS — N179 Acute kidney failure, unspecified: Principal | ICD-10-CM

## 2020-11-19 DIAGNOSIS — Z9981 Dependence on supplemental oxygen: Secondary | ICD-10-CM | POA: Diagnosis not present

## 2020-11-19 DIAGNOSIS — Z7982 Long term (current) use of aspirin: Secondary | ICD-10-CM

## 2020-11-19 DIAGNOSIS — I422 Other hypertrophic cardiomyopathy: Secondary | ICD-10-CM | POA: Diagnosis not present

## 2020-11-19 DIAGNOSIS — M48061 Spinal stenosis, lumbar region without neurogenic claudication: Secondary | ICD-10-CM | POA: Diagnosis present

## 2020-11-19 DIAGNOSIS — L89323 Pressure ulcer of left buttock, stage 3: Secondary | ICD-10-CM | POA: Diagnosis not present

## 2020-11-19 DIAGNOSIS — M4802 Spinal stenosis, cervical region: Secondary | ICD-10-CM | POA: Diagnosis present

## 2020-11-19 DIAGNOSIS — E876 Hypokalemia: Secondary | ICD-10-CM | POA: Diagnosis present

## 2020-11-19 DIAGNOSIS — R110 Nausea: Secondary | ICD-10-CM | POA: Diagnosis not present

## 2020-11-19 DIAGNOSIS — J811 Chronic pulmonary edema: Secondary | ICD-10-CM | POA: Diagnosis not present

## 2020-11-19 DIAGNOSIS — L8940 Pressure ulcer of contiguous site of back, buttock and hip, unspecified stage: Secondary | ICD-10-CM | POA: Diagnosis not present

## 2020-11-19 DIAGNOSIS — R52 Pain, unspecified: Secondary | ICD-10-CM

## 2020-11-19 DIAGNOSIS — Z886 Allergy status to analgesic agent status: Secondary | ICD-10-CM

## 2020-11-19 DIAGNOSIS — N189 Chronic kidney disease, unspecified: Secondary | ICD-10-CM | POA: Diagnosis not present

## 2020-11-19 DIAGNOSIS — Z953 Presence of xenogenic heart valve: Secondary | ICD-10-CM | POA: Diagnosis not present

## 2020-11-19 DIAGNOSIS — Z993 Dependence on wheelchair: Secondary | ICD-10-CM | POA: Diagnosis not present

## 2020-11-19 DIAGNOSIS — I517 Cardiomegaly: Secondary | ICD-10-CM | POA: Diagnosis not present

## 2020-11-19 DIAGNOSIS — J9611 Chronic respiratory failure with hypoxia: Secondary | ICD-10-CM | POA: Diagnosis not present

## 2020-11-19 DIAGNOSIS — M170 Bilateral primary osteoarthritis of knee: Secondary | ICD-10-CM | POA: Diagnosis present

## 2020-11-19 DIAGNOSIS — E785 Hyperlipidemia, unspecified: Secondary | ICD-10-CM | POA: Diagnosis present

## 2020-11-19 DIAGNOSIS — R195 Other fecal abnormalities: Secondary | ICD-10-CM | POA: Diagnosis not present

## 2020-11-19 DIAGNOSIS — L89300 Pressure ulcer of unspecified buttock, unstageable: Secondary | ICD-10-CM | POA: Insufficient documentation

## 2020-11-19 DIAGNOSIS — R1312 Dysphagia, oropharyngeal phase: Secondary | ICD-10-CM | POA: Diagnosis not present

## 2020-11-19 DIAGNOSIS — I129 Hypertensive chronic kidney disease with stage 1 through stage 4 chronic kidney disease, or unspecified chronic kidney disease: Secondary | ICD-10-CM | POA: Diagnosis not present

## 2020-11-19 DIAGNOSIS — G8929 Other chronic pain: Secondary | ICD-10-CM | POA: Diagnosis not present

## 2020-11-19 DIAGNOSIS — R197 Diarrhea, unspecified: Secondary | ICD-10-CM | POA: Diagnosis not present

## 2020-11-19 DIAGNOSIS — M1711 Unilateral primary osteoarthritis, right knee: Secondary | ICD-10-CM | POA: Diagnosis not present

## 2020-11-19 DIAGNOSIS — M6281 Muscle weakness (generalized): Secondary | ICD-10-CM | POA: Diagnosis not present

## 2020-11-19 DIAGNOSIS — Z87891 Personal history of nicotine dependence: Secondary | ICD-10-CM

## 2020-11-19 DIAGNOSIS — Z743 Need for continuous supervision: Secondary | ICD-10-CM | POA: Diagnosis not present

## 2020-11-19 DIAGNOSIS — K921 Melena: Secondary | ICD-10-CM | POA: Diagnosis not present

## 2020-11-19 DIAGNOSIS — K649 Unspecified hemorrhoids: Secondary | ICD-10-CM | POA: Diagnosis not present

## 2020-11-19 DIAGNOSIS — M5412 Radiculopathy, cervical region: Secondary | ICD-10-CM | POA: Diagnosis present

## 2020-11-19 DIAGNOSIS — I130 Hypertensive heart and chronic kidney disease with heart failure and stage 1 through stage 4 chronic kidney disease, or unspecified chronic kidney disease: Secondary | ICD-10-CM | POA: Diagnosis not present

## 2020-11-19 DIAGNOSIS — E8809 Other disorders of plasma-protein metabolism, not elsewhere classified: Secondary | ICD-10-CM | POA: Diagnosis not present

## 2020-11-19 DIAGNOSIS — R0602 Shortness of breath: Secondary | ICD-10-CM | POA: Diagnosis not present

## 2020-11-19 DIAGNOSIS — R6889 Other general symptoms and signs: Secondary | ICD-10-CM | POA: Diagnosis not present

## 2020-11-19 DIAGNOSIS — R404 Transient alteration of awareness: Secondary | ICD-10-CM | POA: Diagnosis not present

## 2020-11-19 DIAGNOSIS — D72829 Elevated white blood cell count, unspecified: Secondary | ICD-10-CM | POA: Diagnosis present

## 2020-11-19 DIAGNOSIS — Z79899 Other long term (current) drug therapy: Secondary | ICD-10-CM

## 2020-11-19 LAB — RETICULOCYTES
Immature Retic Fract: 19.4 % — ABNORMAL HIGH (ref 2.3–15.9)
RBC.: 2.56 MIL/uL — ABNORMAL LOW (ref 3.87–5.11)
Retic Count, Absolute: 56.1 10*3/uL (ref 19.0–186.0)
Retic Ct Pct: 2.2 % (ref 0.4–3.1)

## 2020-11-19 LAB — URINALYSIS, ROUTINE W REFLEX MICROSCOPIC
Bilirubin Urine: NEGATIVE
Glucose, UA: NEGATIVE mg/dL
Ketones, ur: NEGATIVE mg/dL
Nitrite: NEGATIVE
Protein, ur: NEGATIVE mg/dL
Specific Gravity, Urine: 1.012 (ref 1.005–1.030)
pH: 5 (ref 5.0–8.0)

## 2020-11-19 LAB — CBC WITH DIFFERENTIAL/PLATELET
Abs Immature Granulocytes: 0.41 10*3/uL — ABNORMAL HIGH (ref 0.00–0.07)
Basophils Absolute: 0 10*3/uL (ref 0.0–0.1)
Basophils Relative: 0 %
Eosinophils Absolute: 0.1 10*3/uL (ref 0.0–0.5)
Eosinophils Relative: 1 %
HCT: 22.9 % — ABNORMAL LOW (ref 36.0–46.0)
Hemoglobin: 7.3 g/dL — ABNORMAL LOW (ref 12.0–15.0)
Immature Granulocytes: 3 %
Lymphocytes Relative: 4 %
Lymphs Abs: 0.7 10*3/uL (ref 0.7–4.0)
MCH: 29.6 pg (ref 26.0–34.0)
MCHC: 31.9 g/dL (ref 30.0–36.0)
MCV: 92.7 fL (ref 80.0–100.0)
Monocytes Absolute: 0.9 10*3/uL (ref 0.1–1.0)
Monocytes Relative: 6 %
Neutro Abs: 14.2 10*3/uL — ABNORMAL HIGH (ref 1.7–7.7)
Neutrophils Relative %: 86 %
Platelets: 330 10*3/uL (ref 150–400)
RBC: 2.47 MIL/uL — ABNORMAL LOW (ref 3.87–5.11)
RDW: 15.8 % — ABNORMAL HIGH (ref 11.5–15.5)
WBC: 16.3 10*3/uL — ABNORMAL HIGH (ref 4.0–10.5)
nRBC: 0.1 % (ref 0.0–0.2)

## 2020-11-19 LAB — COMPREHENSIVE METABOLIC PANEL
ALT: 37 U/L (ref 0–44)
AST: 40 U/L (ref 15–41)
Albumin: 2 g/dL — ABNORMAL LOW (ref 3.5–5.0)
Alkaline Phosphatase: 78 U/L (ref 38–126)
Anion gap: 16 — ABNORMAL HIGH (ref 5–15)
BUN: 185 mg/dL — ABNORMAL HIGH (ref 8–23)
CO2: 22 mmol/L (ref 22–32)
Calcium: 8.4 mg/dL — ABNORMAL LOW (ref 8.9–10.3)
Chloride: 102 mmol/L (ref 98–111)
Creatinine, Ser: 6.2 mg/dL — ABNORMAL HIGH (ref 0.44–1.00)
GFR, Estimated: 7 mL/min — ABNORMAL LOW (ref >60)
Glucose, Bld: 119 mg/dL — ABNORMAL HIGH (ref 70–99)
Potassium: 3.1 mmol/L — ABNORMAL LOW (ref 3.5–5.1)
Sodium: 140 mmol/L (ref 135–145)
Total Bilirubin: 0.6 mg/dL (ref 0.3–1.2)
Total Protein: 6.2 g/dL — ABNORMAL LOW (ref 6.5–8.1)

## 2020-11-19 LAB — LACTIC ACID, PLASMA: Lactic Acid, Venous: 0.7 mmol/L (ref 0.5–1.9)

## 2020-11-19 LAB — IRON AND TIBC
Iron: 20 ug/dL — ABNORMAL LOW (ref 28–170)
Saturation Ratios: 20 % (ref 10.4–31.8)
TIBC: 101 ug/dL — ABNORMAL LOW (ref 250–450)
UIBC: 81 ug/dL

## 2020-11-19 LAB — POC OCCULT BLOOD, ED: Fecal Occult Bld: POSITIVE — AB

## 2020-11-19 LAB — FOLATE: Folate: 10.9 ng/mL (ref >5.9)

## 2020-11-19 LAB — VITAMIN B12: Vitamin B-12: 474 pg/mL (ref 180–914)

## 2020-11-19 LAB — FERRITIN: Ferritin: 1097 ng/mL — ABNORMAL HIGH (ref 11–307)

## 2020-11-19 LAB — PREPARE RBC (CROSSMATCH)

## 2020-11-19 LAB — RESP PANEL BY RT-PCR (FLU A&B, COVID) ARPGX2
Influenza A by PCR: NEGATIVE
Influenza B by PCR: NEGATIVE
SARS Coronavirus 2 by RT PCR: NEGATIVE

## 2020-11-19 MED ORDER — SODIUM CHLORIDE 0.9 % IV SOLN: INTRAVENOUS | Status: DC

## 2020-11-19 MED ORDER — ALBUMIN HUMAN 25 % IV SOLN
25.0000 g | Freq: Four times a day (QID) | INTRAVENOUS | Status: AC
  Administered 2020-11-19 – 2020-11-21 (×8): 25 g via INTRAVENOUS
  Filled 2020-11-19 (×11): qty 100

## 2020-11-19 MED ORDER — SODIUM CHLORIDE 0.9 % IV SOLN
1.0000 g | Freq: Once | INTRAVENOUS | Status: AC
  Administered 2020-11-19: 1 g via INTRAVENOUS

## 2020-11-19 MED ORDER — ONE-DAILY MULTI VITAMINS PO TABS: 1.0000 | ORAL_TABLET | Freq: Every day | ORAL | Status: DC

## 2020-11-19 MED ORDER — ADULT MULTIVITAMIN W/MINERALS CH
1.0000 | ORAL_TABLET | Freq: Every day | ORAL | Status: DC
  Administered 2020-11-28: 1 via ORAL
  Filled 2020-11-19 (×4): qty 1

## 2020-11-19 MED ORDER — ASPIRIN EC 81 MG PO TBEC
81.0000 mg | DELAYED_RELEASE_TABLET | Freq: Every day | ORAL | Status: DC
  Administered 2020-11-20 – 2020-11-30 (×11): 81 mg via ORAL
  Filled 2020-11-19 (×11): qty 1

## 2020-11-19 MED ORDER — DULOXETINE HCL 30 MG PO CPEP
30.0000 mg | ORAL_CAPSULE | Freq: Every day | ORAL | Status: DC
  Administered 2020-11-20 – 2020-11-30 (×11): 30 mg via ORAL
  Filled 2020-11-19 (×12): qty 1

## 2020-11-19 MED ORDER — DARBEPOETIN ALFA 40 MCG/0.4ML IJ SOSY
40.0000 ug | PREFILLED_SYRINGE | Freq: Once | INTRAMUSCULAR | Status: AC
  Administered 2020-11-19: 40 ug via SUBCUTANEOUS
  Filled 2020-11-19: qty 0.4

## 2020-11-19 MED ORDER — CAPSAICIN 0.075 % EX CREA
TOPICAL_CREAM | Freq: Two times a day (BID) | CUTANEOUS | Status: DC
  Administered 2020-11-21 – 2020-11-28 (×2): 1 via TOPICAL
  Filled 2020-11-19 (×2): qty 60

## 2020-11-19 MED ORDER — GABAPENTIN 100 MG PO CAPS
100.0000 mg | ORAL_CAPSULE | Freq: Every day | ORAL | Status: DC
  Administered 2020-11-19 – 2020-11-29 (×11): 100 mg via ORAL
  Filled 2020-11-19 (×11): qty 1

## 2020-11-19 MED ORDER — POTASSIUM CHLORIDE CRYS ER 20 MEQ PO TBCR
40.0000 meq | EXTENDED_RELEASE_TABLET | Freq: Once | ORAL | Status: AC
  Administered 2020-11-19: 40 meq via ORAL
  Filled 2020-11-19: qty 2

## 2020-11-19 MED ORDER — POLYVINYL ALCOHOL 1.4 % OP SOLN: 1.0000 [drp] | OPHTHALMIC | Status: DC | PRN

## 2020-11-19 MED ORDER — SODIUM CHLORIDE 0.9 % IV SOLN
10.0000 mL/h | Freq: Once | INTRAVENOUS | Status: AC
  Administered 2020-11-19: 10 mL/h via INTRAVENOUS

## 2020-11-19 MED ORDER — AMLODIPINE BESYLATE 5 MG PO TABS
5.0000 mg | ORAL_TABLET | Freq: Every day | ORAL | Status: DC
  Administered 2020-11-19 – 2020-11-29 (×11): 5 mg via ORAL
  Filled 2020-11-19 (×11): qty 1

## 2020-11-19 MED ORDER — CARVEDILOL 25 MG PO TABS
25.0000 mg | ORAL_TABLET | Freq: Two times a day (BID) | ORAL | Status: DC
  Administered 2020-11-20 – 2020-11-30 (×21): 25 mg via ORAL
  Filled 2020-11-19 (×2): qty 1
  Filled 2020-11-19: qty 2
  Filled 2020-11-19 (×5): qty 1
  Filled 2020-11-19: qty 2
  Filled 2020-11-19 (×12): qty 1

## 2020-11-19 MED ORDER — ONDANSETRON HCL 4 MG PO TABS
4.0000 mg | ORAL_TABLET | Freq: Three times a day (TID) | ORAL | Status: DC | PRN
  Administered 2020-11-22 – 2020-11-27 (×5): 4 mg via ORAL
  Filled 2020-11-19 (×5): qty 1

## 2020-11-19 MED ORDER — PROPYLENE GLYCOL 0.6 % OP SOLN: 1.0000 [drp] | Freq: Every day | OPHTHALMIC | Status: DC | PRN

## 2020-11-19 MED ORDER — HEPARIN SODIUM (PORCINE) 5000 UNIT/ML IJ SOLN
5000.0000 [IU] | Freq: Two times a day (BID) | INTRAMUSCULAR | Status: DC
  Administered 2020-11-19 – 2020-11-30 (×22): 5000 [IU] via SUBCUTANEOUS
  Filled 2020-11-19 (×22): qty 1

## 2020-11-19 MED ORDER — FLUTICASONE PROPIONATE 50 MCG/ACT NA SUSP: 2.0000 | Freq: Every day | NASAL | Status: DC | PRN

## 2020-11-19 MED ORDER — CALCITRIOL 0.25 MCG PO CAPS
0.2500 ug | ORAL_CAPSULE | ORAL | Status: DC
  Administered 2020-11-20 – 2020-11-27 (×3): 0.25 ug via ORAL
  Filled 2020-11-19 (×3): qty 1

## 2020-11-19 MED ORDER — CALCIUM CARBONATE ANTACID 500 MG PO CHEW
1.0000 | CHEWABLE_TABLET | Freq: Every day | ORAL | Status: DC | PRN
  Administered 2020-11-23: 200 mg via ORAL
  Filled 2020-11-19: qty 1

## 2020-11-19 MED ORDER — FENTANYL CITRATE PF 50 MCG/ML IJ SOSY
50.0000 ug | PREFILLED_SYRINGE | Freq: Once | INTRAMUSCULAR | Status: AC
  Administered 2020-11-19: 50 ug via INTRAVENOUS
  Filled 2020-11-19: qty 1

## 2020-11-19 NOTE — ED Provider Notes (Signed)
Signout from Dr. Billy Fischer.  75 year old female here for evaluation of neck and bilateral lower extremity pains after a fall few days ago.  She is mostly bedbound although has been able to transfer but less over the past few weeks.  She has worsening anemia and will need transfusion.  She is in for MRI of her cervical and lumbar spine. Physical Exam  BP (!) 117/58   Pulse 82   Temp 98.9 F (37.2 C)   Resp (!) 22   Ht 5\' 6"  (1.676 m)   Wt 77.1 kg   SpO2 100%   BMI 27.44 kg/m   Physical Exam  ED Course/Procedures     Procedures  MDM  Patient's lab coming back with worsening of her CKD.  She said she could not tolerate the MRI due to being claustrophobic and not able to lay down flat that long.  Patient tells me that she is got known kidney disease and follows with Dr. Hollie Salk but has not seen her in a while.  She said she has not been eating and drinking much.  5 PM, discussed with [facility] hospitalist Dr. Roosevelt Locks who will evaluate the patient for admission.       Hayden Rasmussen, MD 11/20/20 1043

## 2020-11-19 NOTE — Progress Notes (Signed)
PT Cancellation Note  Patient Details Name: [name] MRN: [id number] DOB: 1946-03-18   Cancelled Treatment:    Reason Eval/Treat Not Completed: Other (comment) Awaiting cervical and lumbar MRI results in setting of a fall.  Wyona Almas, PT, DPT Acute Rehabilitation Services Pager [phone] Office [phone]    Deno Etienne 11/19/2020, 3:10 PM

## 2020-11-19 NOTE — ED Provider Notes (Signed)
[facility] EMERGENCY DEPARTMENT Provider Note   CSN: [id number] Arrival date & time: 11/19/20  0456     History No chief complaint on file.   [name] is a 75 y.o. female with PMHx of DDD, cervical radiculopathy, chronic neck, back and knee pain, history of ascending aortic dissection s/p repair, AV disease s/p repair, CHF, pulm HTN on 2-3L O2 at baseline, CKD, HTN, chronic anemia who presented to [facility] ED for neck pain and BLE weakness. Patient has been having neck pain for several years, used to get injections, states she has muscle spasm and calcified disc. Her neck pain has been stable over the last several weeks. Over about six months, she has had progressive weakness in her legs. She used to ambulate with a cane, now over the last month has been mostly bed bound, sitting at EOB since she is too weak to stand. Patient used to use bedside commode though last weak has transitioned to using diapers since she is unable to get to bedside commode. Patient has not seen a doctor in the last 6 months about her weakness and neck pain. She had a fall from the commode two days ago but did not hit her head, did not have acute worsening of neck pain or BLE weakness. She denies fever, CP, worsening SOB, abdominal pain, nausea, vomiting, weakness in her BUE, tingling/numbness in her extremities, loss of bladder or bowel function. She endorses recent poor PO intake, bilateral knee pain, intermittent diarrhea.    HPI     Past Medical History:  Diagnosis Date   Acute pancreatitis 02/04/2017   [name] 02/04/2017   Adenomatous colon polyp 01/2002   Anemia, chronic disease    /notes 02/04/2017   Aortic valve disease    AI/AS   Arthritis    "lower back, knees" (02/04/2017)   CHF (congestive heart failure) (HCC)    CKD (chronic kidney disease), stage III (HCC)    Diverticulosis of colon    DJD (degenerative joint disease)    Frequent UTI    Hemorrhoids    Hypertension     Lumbar back pain    Neuropathy    Obesity    Pericardial effusion     in a patient with Diastolic heart  failure and Pericardial effusion  known since last 2 D echo in 11/2016 /notes 02/04/2017   PONV (postoperative nausea and vomiting)    Pulmonary HTN (HCC)    Renal cyst    Venous insufficiency    Vitamin D deficiency     Patient Active Problem List   Diagnosis Date Noted   Nonrheumatic mitral valve regurgitation 03/16/2020   Acute respiratory disease due to COVID-19 virus 06/05/2019   Degenerative arthritis of knee, bilateral 04/06/2019   Encounter for general adult medical examination with abnormal findings 02/26/2019   Degenerative disc disease, cervical 12/12/2018   CKD (chronic kidney disease), stage IV ([place]) 08/10/2018   Leg swelling 11/24/2017   Bilateral leg weakness 06/26/2017   History of aortic valve replacement with bioprosthetic valve 05/30/2017   Ascending aortic dissection (HCC)    Chronic diastolic CHF (congestive heart failure) ([place]) 04/19/2017   Nonrheumatic aortic valve insufficiency 03/10/2017   Pulmonary HTN ([place]) 03/10/2017   Neck pain 02/11/2017   Pericardial effusion 02/04/2017   Abnormal bone marrow examination 01/24/2017   Stenosis of right carotid artery 08/27/2016   LBBB (left bundle branch block) 08/27/2016   Left-sided tinnitus 08/16/2016   Anemia in chronic kidney disease 11/02/2015  OA (osteoarthritis) of knee 03/23/2012   Vitamin D deficiency 04/13/2008   OBESITY 02/23/2008   Radiculopathy 02/23/2008   Essential hypertension 02/23/2008   Venous (peripheral) insufficiency 02/23/2008    Past Surgical History:  Procedure Laterality Date   ABDOMINAL HYSTERECTOMY     "partial"   AORTIC VALVE REPLACEMENT N/A 05/30/2017   Procedure: AORTIC VALVE REPLACEMENT (AVR);  Surgeon: Prescott Gum, Collier Salina, MD;  Location: [facility] OR;  Service: Vascular;  Laterality: N/A;   COLONOSCOPY W/ BIOPSIES AND POLYPECTOMY     "bx was ok"   IR THORACENTESIS ASP PLEURAL  SPACE W/IMG GUIDE  06/11/2017   IR THORACENTESIS ASP PLEURAL SPACE W/IMG GUIDE  06/12/2017   REPAIR OF ACUTE ASCENDING THORACIC AORTIC DISSECTION N/A 05/30/2017   Procedure: REPLACEMENT OF ASCENDING AORTIC ANEURYSM AND REPAIRED CHRONIC ROOT DISSECTION WITH CIRC ARREST;  Surgeon: Ivin Poot, MD;  Location: [facility];  Service: Vascular;  Laterality: N/A;   TEE WITHOUT CARDIOVERSION N/A 05/28/2017   Procedure: TRANSESOPHAGEAL ECHOCARDIOGRAM (TEE);  Surgeon: Skeet Latch, MD;  Location: [facility];  Service: Cardiovascular;  Laterality: N/A;   TEE WITHOUT CARDIOVERSION N/A 05/30/2017   Procedure: TRANSESOPHAGEAL ECHOCARDIOGRAM (TEE);  Surgeon: Prescott Gum, Collier Salina, MD;  Location: [place];  Service: Open Heart Surgery;  Laterality: N/A;     OB History   No obstetric history on file.     Family History  Problem Relation Age of Onset   Diabetes Mother    Colon polyps Mother    Hypertension Mother    Hypertension Brother    Hypertension Sister    Colon cancer Neg Hx     Social History   Tobacco Use   Smoking status: Former    Packs/day: 0.40    Years: 15.00    Pack years: 6.00    Types: Cigarettes    Quit date: 04/08/1982    Years since quitting: 38.6   Smokeless tobacco: Never  Vaping Use   Vaping Use: Never used  Substance Use Topics   Alcohol use: No   Drug use: No    Home Medications Prior to Admission medications   Medication Sig Start Date End Date Taking? Authorizing Provider  amLODipine (NORVASC) 5 MG tablet Take 5 mg by mouth at bedtime. 05/07/18  Yes [provider]  aspirin EC 81 MG tablet Take 81 mg by mouth daily.   Yes [provider]  calcitRIOL (ROCALTROL) 0.25 MCG capsule Take 0.25 mcg by mouth See admin instructions. Take one capsule (0.25 mcg) by mouth twice weekly - Monday and Friday   Yes [provider]  CALCIUM CARBONATE ANTACID PO Take 2 tablets by mouth daily as needed for indigestion or heartburn.    Yes [provider]   carvedilol (COREG) 25 MG tablet Take 1 tablet (25 mg total) by mouth 2 (two) times daily with a meal. 07/16/18  Yes Aline August, MD  Colchicine 0.6 MG CAPS Take 0.6 mg by mouth as needed. Take every other day as needed during gout flare; not to exceed 3 doses per flare 03/22/20  Yes [provider]  cyanocobalamin 1000 MCG tablet Take 1,000 mcg by mouth daily.   Yes [provider]  fluticasone (FLONASE) 50 MCG/ACT nasal spray Place 2 sprays into both nostrils daily as needed for allergies. 07/16/18  Yes Aline August, MD  furosemide (LASIX) 80 MG tablet Take 80 mg by mouth 2 (two) times daily.   Yes [provider]  gabapentin (NEURONTIN) 100 MG capsule Take 1 capsule (100  mg total) by mouth at bedtime. 02/04/20  Yes Lyndal Pulley, DO  Multiple Vitamin (MULTIVITAMIN) tablet Take 1 tablet by mouth daily.   Yes [provider]  ondansetron (ZOFRAN) 4 MG tablet Take 1 tablet (4 mg total) by mouth every 8 (eight) hours as needed for nausea or vomiting. 07/16/18  Yes Aline August, MD  OXYGEN Inhale 2 L into the lungs as needed (shortness of breath).   Yes [provider]  potassium chloride SA (K-DUR,KLOR-CON) 20 MEQ tablet Take 20 mEq by mouth daily after lunch.    Yes [provider]  Propylene Glycol 0.6 % SOLN Place 1 drop into both eyes daily as needed (for dry eyes).   Yes [provider]  TART CHERRY PO Take 1 tablet by mouth daily.   Yes [provider]  hydrocortisone (ANUSOL-HC) 2.5 % rectal cream Place 1 application rectally as needed. Patient not taking: Reported on 11/19/2020 05/24/13   Noralee Space, MD  triamcinolone ointment (KENALOG) 0.5 % Apply 1 application topically daily as needed (itching). Patient not taking: Reported on 11/19/2020 06/16/17   Nani Skillern, PA-C    Allergies    Methylprednisolone, Minoxidil, and Naproxen  Review of Systems   Review of Systems  Constitutional:  Positive for  appetite change. Negative for fever.  Respiratory:  Positive for shortness of breath. Negative for cough.   Cardiovascular:  Negative for chest pain and leg swelling.  Gastrointestinal:  Positive for diarrhea. Negative for abdominal pain, anal bleeding, blood in stool, constipation, nausea and vomiting.  Genitourinary:  Negative for dysuria, hematuria, urgency and vaginal bleeding.  Musculoskeletal:  Positive for arthralgias, back pain, gait problem, neck pain and neck stiffness.  Neurological:  Positive for weakness (BLE weakness). Negative for dizziness, light-headedness and numbness.   Physical Exam Updated Vital Signs BP (!) 117/58   Pulse 82   Temp 98.9 F (37.2 C)   Resp (!) 22   Ht 5\' 6"  (1.676 m)   Wt 77.1 kg   SpO2 100%   BMI 27.44 kg/m   Physical Exam Constitutional:      Appearance: She is obese.     Comments: Poor grooming  HENT:     Head: Normocephalic and atraumatic.     Mouth/Throat:     Mouth: Mucous membranes are moist.     Pharynx: Oropharynx is clear.  Eyes:     Extraocular Movements: Extraocular movements intact.     Conjunctiva/sclera: Conjunctivae normal.  Cardiovascular:     Rate and Rhythm: Normal rate and regular rhythm.     Pulses: Normal pulses.     Heart sounds: No murmur heard. Pulmonary:     Effort: Pulmonary effort is normal. No respiratory distress.     Breath sounds: Normal breath sounds.  Abdominal:     General: Abdomen is flat. Bowel sounds are normal. There is no distension.     Palpations: Abdomen is soft.     Tenderness: There is no abdominal tenderness. There is no guarding.  Musculoskeletal:        General: Tenderness (lumbar spine tenderness, L trapezius tenderness lateral neck) present. No deformity or signs of injury.     Cervical back: Rigidity (DROM with flexion extension rotation and tiliting in all directions) present. No tenderness.  Skin:    General: Skin is warm and dry.     Comments: Chronic venous stasis changes BLE   Neurological:     Mental Status: She is alert and oriented to person,  place, and time.     Cranial Nerves: No cranial nerve deficit.     Sensory: No sensory deficit.     Motor: Weakness (hip flexion 1/5, dorsiflexion 3/5, plantar flexion 3/5, knee flexion/extension strength exam limited due to pain) present.     Gait: Gait abnormal (unable to stand for ambultion).  Psychiatric:        Mood and Affect: Mood normal.        Behavior: Behavior normal. Behavior is cooperative.    ED Results / Procedures / Treatments   Labs (all labs ordered are listed, but only abnormal results are displayed) Labs Reviewed  CBC WITH DIFFERENTIAL/PLATELET - Abnormal; Notable for the following components:      Result Value   WBC 16.3 (*)    RBC 2.47 (*)    Hemoglobin 7.3 (*)    HCT 22.9 (*)    RDW 15.8 (*)    Neutro Abs 14.2 (*)    Abs Immature Granulocytes 0.41 (*)    All other components within normal limits  RESP PANEL BY RT-PCR (FLU A&B, COVID) ARPGX2  COMPREHENSIVE METABOLIC PANEL  URINALYSIS, ROUTINE W REFLEX MICROSCOPIC  VITAMIN B12  FOLATE  IRON AND TIBC  FERRITIN  RETICULOCYTES  POC OCCULT BLOOD, ED  TYPE AND SCREEN  PREPARE RBC (CROSSMATCH)    EKG EKG Interpretation  Date/Time:  Sunday November 19 2020 14:51:54 EDT Ventricular Rate:  87 PR Interval:  189 QRS Duration: 197 QT Interval:  471 QTC Calculation: 567 R Axis:   195 Text Interpretation: Sinus rhythm Nonspecific intraventricular conduction delay No significant change since last tracing Confirmed by Gareth Morgan [phone]) on 11/19/2020 3:11:03 PM  Radiology No results found.  Procedures Procedures No procedures performed this ED visit  Medications Ordered in ED Medications  0.9 %  sodium chloride infusion (has no administration in time range)    ED Course  I have reviewed the triage vital signs and the nursing notes.  Pertinent labs & imaging results that were available during my care of the patient were  reviewed by me and considered in my medical decision making (see chart for details).    MDM Rules/Calculators/A&P                         Sheanna Dail is a 75 y.o. female with PMHx of DDD, cervical radiculopathy, chronic neck, back and knee pain, history of ascending aortic dissection s/p repair, AV disease s/p repair, CHF, pulm HTN on 2-3L O2 at baseline, CKD, HTN, chronic anemia who presented to [facility] ED for neck pain and BLE weakness. Patient afebrile and HDS on arrival. EKG NSR stable from prior. Her neck pain and BLE weakness have been a chronic problem, no acute worsening of pain or weakness. Low concern for acute causes of neck pain such as referred chest or abdominal pain, aortic dissection. Spurling's and [place] upper limb tests negative. Mod to severe restricted ROM of neck. Tenderness to palpation of trapezius at L lateral neck and to lumbar spine. Prior spine MRI from 2019 showed moderate central disc protrusion at C4-C5 with moderate canal and severe bilateral lateral recess stenosis. Will order repeat MR cervical and lumbar spine to evaluate for changes. Ordered PT eval and TOC for possible SNF placement given patient may need low intensity rehab with nursing care vs [facility] PT and home nursing care secondary to poor mobility and medical needs. CBC with leukocytosis to 16.3 and anemia 7.3. Given her general debility,  its possible she is symptomatic from her anemia. No source of bleeding identified. FOCB ordered. Likely anemia 2/2 CKD. The symptomatic anemia may be contributing to her BLE weakness and recent inability to get out of bed. We will transfuse her with plan to admit for symptomatic anemia. MR cervical and lumbar and CMP, type and screen, FOCB, Covid test pending. Care was transferred to oncoming ED care provider.     Final Clinical Impression(s) / ED Diagnoses Final diagnoses:  Symptomatic anemia    Rx / DC Orders ED Discharge Orders     None        Wayland Denis,  MD 11/19/20 1541    Gareth Morgan, MD 11/20/20 1029

## 2020-11-19 NOTE — TOC Initial Note (Signed)
Transition of Care [facility]) - Initial/Assessment Note    Patient Details  Name: [name] MRN: [id number] Date of Birth: [birth year]  Transition of Care [facility] CM/SW Contact:    Verdell Carmine, RN Phone Number: 11/19/2020, 2:58 PM  Clinical Narrative:                  75 year old patient that had he bedside commode collapse and suffered a fall. Currently receiving MRI. PT consulted for recommendations, [place] v SNF. Lives at home with husband currently. CM will follow for needs and transitions.Will need DME and HH if goes home  Expected Discharge Plan:  ([place] vs SNF) Barriers to Discharge: Continued Medical Work up   Patient Goals and CMS Choice        Expected Discharge Plan and Services Expected Discharge Plan:  ([place] vs SNF) In-house Referral: Clinical Social Work Discharge Planning Services: CM Consult   Living arrangements for the past 2 months: [place]                                      Prior Living Arrangements/Services Living arrangements for the past 2 months: [place] Lives with:: Spouse Patient language and need for interpreter reviewed:: Yes        Need for Family Participation in Patient Care: Yes (Comment) Care giver support system in place?: Yes (comment) Current home services: DME (bsc, will need new 3:1) Criminal Activity/Legal Involvement Pertinent to Current Situation/Hospitalization: No - Comment as needed  Activities of Daily Living      Permission Sought/Granted                  Emotional Assessment       Orientation: : Oriented to Self, Oriented to Place, Oriented to Situation Alcohol / Substance Use: Not Applicable Psych Involvement: No (comment)  Admission diagnosis:  Neck Pain; pt on oxygen Patient Active Problem List   Diagnosis Date Noted   Nonrheumatic mitral valve regurgitation 03/16/2020   Acute respiratory disease due to COVID-19 virus 06/05/2019   Degenerative arthritis of knee, bilateral  04/06/2019   Encounter for general adult medical examination with abnormal findings 02/26/2019   Degenerative disc disease, cervical 12/12/2018   CKD (chronic kidney disease), stage IV ([place]) 08/10/2018   Leg swelling 11/24/2017   Bilateral leg weakness 06/26/2017   History of aortic valve replacement with bioprosthetic valve 05/30/2017   Ascending aortic dissection (HCC)    Chronic diastolic CHF (congestive heart failure) ([place]) 04/19/2017   Nonrheumatic aortic valve insufficiency 03/10/2017   Pulmonary HTN ([place]) 03/10/2017   Neck pain 02/11/2017   Pericardial effusion 02/04/2017   Abnormal bone marrow examination 01/24/2017   Stenosis of right carotid artery 08/27/2016   LBBB (left bundle branch block) 08/27/2016   Left-sided tinnitus 08/16/2016   Anemia in chronic kidney disease 11/02/2015   OA (osteoarthritis) of knee 03/23/2012   Vitamin D deficiency 04/13/2008   OBESITY 02/23/2008   Radiculopathy 02/23/2008   Essential hypertension 02/23/2008   Venous (peripheral) insufficiency 02/23/2008   PCP:  Hoyt Koch, MD Pharmacy:   [facility] - [place], [place] - [address] [place] [address] Phone: [phone] Fax: [phone]     Social Determinants of Health (SDOH) Interventions    Readmission Risk Interventions Readmission Risk Prevention Plan 07/15/2018  [place]  Appt Complete  Medication Screening Complete  Transportation Screening Complete  Some recent data might be hidden

## 2020-11-19 NOTE — ED Triage Notes (Signed)
Pt from home with husbandvia EMS c/o neck and BLE pain. Pt reports being on a bedside commode on Friday when the commode collapse causing her to fall. On home oxygen 2-3L

## 2020-11-19 NOTE — H&P (Addendum)
History and Physical    [name] KDT:[id number] DOB: [birth year] DOA: 11/19/2020  PCP: Hoyt Koch, MD (Confirm with patient/family/NH records and if not entered, this has to be entered at [facility] point of entry) Patient coming from: Home  I have personally briefly reviewed patient's old medical records in [place]  Chief Complaint: Feeling sick  HPI: [name] is a 75 y.o. female with medical history significant of CKD stage IV, chronic diastolic CHF with severe hypertrophic cardiomyopathy, chronic hypoxic respite failure with baseline 2 to 3 L all the time, severe AS status post biosynthetic aortic valve replacement, HTN, chronic cervical spine radiculopathy, chronic ambulation dysfunction secondary to chronic severe bilateral knee OA, Gout, frequent UTIs, morbid obesity, presented with worsening of bilateral knee pain and bilateral lower extremity weakness.  Patient has a baseline severe cervical spine radiculopathy and chronic neck pain, significantly patient has been wheelchair-bound for about 2 years because of bilateral severe knee OA and ambulation dysfunction.  He was told by his cardiology "no knee surgery for your heart problems". She used to get knee injection from orthopedic surgery every 8 to 10 weeks, and last injection was in February 2022.  Lately, his husband can no longer take care of her, and patient at baseline able to transfer herself from bed to wheelchair and go to bathroom, last week, she started wearing diapers, but after 2 days she developed rash and stopped using the diapers.  Family bought him a bedside commode chair, and she tried to use once and fell down the middle of night, denied any prodromes of lightheadedness blurry vision shortness of breath. She felt knee pain and weak, and she lied on the floor until morning.. She also has had significant decrease of p.o. intakes, she drinks only, one and half 1.6 oz cup of fluid a day.  She denies any  cough, no chest pains, no diarrhea no urinary problems and no fever chills.  She has a CKD stage IV, follows with nephrology Dr. Hollie Salk every 6 months, last visit was earlier this year, but she was not sure whether blood work was done on that visit.  Patient denied any abdominal pain, no dark-colored stool, no nauseous or vomiting.  ED Course: Patient was found to have anemia creatinine 7.3, WBC 16.3, creatinine 6.2 compared to baseline 2.3 last year, BUN 185, bicarb 22, K3.1.  ED concerned about worsening of bilateral lower extremity weakness, MRI cervical spine and lumbar spine ordered however patient did not tolerate lying flat.  Review of Systems: As per HPI otherwise 14 point review of systems negative.  .  Past Medical History:  Diagnosis Date   Acute pancreatitis 02/04/2017   [name] 02/04/2017   Adenomatous colon polyp 01/2002   Anemia, chronic disease    /notes 02/04/2017   Aortic valve disease    AI/AS   Arthritis    "lower back, knees" (02/04/2017)   CHF (congestive heart failure) (HCC)    CKD (chronic kidney disease), stage III (HCC)    Diverticulosis of colon    DJD (degenerative joint disease)    Frequent UTI    Hemorrhoids    Hypertension    Lumbar back pain    Neuropathy    Obesity    Pericardial effusion     in a patient with Diastolic heart  failure and Pericardial effusion  known since last 2 D echo in 11/2016 /notes 02/04/2017   PONV (postoperative nausea and vomiting)    Pulmonary HTN ([place])  Renal cyst    Venous insufficiency    Vitamin D deficiency     Past Surgical History:  Procedure Laterality Date   ABDOMINAL HYSTERECTOMY     "partial"   AORTIC VALVE REPLACEMENT N/A 05/30/2017   Procedure: AORTIC VALVE REPLACEMENT (AVR);  Surgeon: Prescott Gum, Collier Salina, MD;  Location: [facility] OR;  Service: Vascular;  Laterality: N/A;   COLONOSCOPY W/ BIOPSIES AND POLYPECTOMY     "bx was ok"   IR THORACENTESIS ASP PLEURAL SPACE W/IMG GUIDE  06/11/2017   IR THORACENTESIS  ASP PLEURAL SPACE W/IMG GUIDE  06/12/2017   REPAIR OF ACUTE ASCENDING THORACIC AORTIC DISSECTION N/A 05/30/2017   Procedure: REPLACEMENT OF ASCENDING AORTIC ANEURYSM AND REPAIRED CHRONIC ROOT DISSECTION WITH CIRC ARREST;  Surgeon: Ivin Poot, MD;  Location: [place];  Service: Vascular;  Laterality: N/A;   TEE WITHOUT CARDIOVERSION N/A 05/28/2017   Procedure: TRANSESOPHAGEAL ECHOCARDIOGRAM (TEE);  Surgeon: Skeet Latch, MD;  Location: [place];  Service: Cardiovascular;  Laterality: N/A;   TEE WITHOUT CARDIOVERSION N/A 05/30/2017   Procedure: TRANSESOPHAGEAL ECHOCARDIOGRAM (TEE);  Surgeon: Prescott Gum, Collier Salina, MD;  Location: [place];  Service: Open Heart Surgery;  Laterality: N/A;     reports that she quit smoking about 38 years ago. Her smoking use included cigarettes. She has a 6.00 pack-year smoking history. She has never used smokeless tobacco. She reports that she does not drink alcohol and does not use drugs.  Allergies  Allergen Reactions   Methylprednisolone Other (See Comments)    Caused severe GERD in October 2018   Minoxidil Palpitations and Other (See Comments)    unable to sleep   Naproxen Swelling    Family History  Problem Relation Age of Onset   Diabetes Mother    Colon polyps Mother    Hypertension Mother    Hypertension Brother    Hypertension Sister    Colon cancer Neg Hx      Prior to Admission medications   Medication Sig Start Date End Date Taking? Authorizing Provider  amLODipine (NORVASC) 5 MG tablet Take 5 mg by mouth at bedtime. 05/07/18  Yes [provider]  aspirin EC 81 MG tablet Take 81 mg by mouth daily.   Yes [provider]  calcitRIOL (ROCALTROL) 0.25 MCG capsule Take 0.25 mcg by mouth See admin instructions. Take one capsule (0.25 mcg) by mouth twice weekly - Monday and Friday   Yes [provider]  CALCIUM CARBONATE ANTACID PO Take 2 tablets by mouth daily as needed for indigestion or heartburn.    Yes [provider]  carvedilol (COREG) 25 MG tablet Take 1 tablet (25 mg total) by mouth 2 (two) times daily with a meal. 07/16/18  Yes Aline August, MD  Colchicine 0.6 MG CAPS Take 0.6 mg by mouth as needed. Take every other day as needed during gout flare; not to exceed 3 doses per flare 03/22/20  Yes [provider]  cyanocobalamin 1000 MCG tablet Take 1,000 mcg by mouth daily.   Yes [provider]  fluticasone (FLONASE) 50 MCG/ACT nasal spray Place 2 sprays into both nostrils daily as needed for allergies. 07/16/18  Yes Aline August, MD  furosemide (LASIX) 80 MG tablet Take 80 mg by mouth 2 (two) times daily.   Yes [provider]  gabapentin (NEURONTIN) 100 MG capsule Take 1 capsule (100 mg total) by mouth at bedtime. 02/04/20  Yes Lyndal Pulley, DO  Multiple Vitamin (MULTIVITAMIN) tablet Take 1 tablet by mouth daily.  Yes [provider]  ondansetron (ZOFRAN) 4 MG tablet Take 1 tablet (4 mg total) by mouth every 8 (eight) hours as needed for nausea or vomiting. 07/16/18  Yes Aline August, MD  OXYGEN Inhale 2 L into the lungs as needed (shortness of breath).   Yes [provider]  potassium chloride SA (K-DUR,KLOR-CON) 20 MEQ tablet Take 20 mEq by mouth daily after lunch.    Yes [provider]  Propylene Glycol 0.6 % SOLN Place 1 drop into both eyes daily as needed (for dry eyes).   Yes [provider]  TART CHERRY PO Take 1 tablet by mouth daily.   Yes [provider]  hydrocortisone (ANUSOL-HC) 2.5 % rectal cream Place 1 application rectally as needed. Patient not taking: Reported on 11/19/2020 05/24/13   Noralee Space, MD  triamcinolone ointment (KENALOG) 0.5 % Apply 1 application topically daily as needed (itching). Patient not taking: Reported on 11/19/2020 06/16/17   Nani Skillern, PA-C    Physical Exam: Vitals:   11/19/20 1345 11/19/20 1430 11/19/20 1445 11/19/20 1715  BP: 119/61 121/60 (!) 117/58 122/75   Pulse: 87 85 82 88  Resp: (!) 21 (!) 27 (!) 22 (!) 28  Temp:      SpO2: 99% 100% 100% 98%  Weight:      Height:        Constitutional: NAD, calm, comfortable Vitals:   11/19/20 1345 11/19/20 1430 11/19/20 1445 11/19/20 1715  BP: 119/61 121/60 (!) 117/58 122/75  Pulse: 87 85 82 88  Resp: (!) 21 (!) 27 (!) 22 (!) 28  Temp:      SpO2: 99% 100% 100% 98%  Weight:      Height:       Eyes: PERRL, lids and conjunctivae normal ENMT: Mucous membranes are dry. Posterior pharynx clear of any exudate or lesions.Normal dentition.  Neck: normal, supple, no masses, no thyromegaly Respiratory: clear to auscultation bilaterally, no wheezing, no crackles. Normal respiratory effort. No accessory muscle use.  Cardiovascular: Regular rate and rhythm, no murmurs / rubs / gallops. No extremity edema. 2+ pedal pulses. No carotid bruits.  Abdomen: no tenderness, no masses palpated. No hepatosplenomegaly. Bowel sounds positive.  Musculoskeletal: no clubbing / cyanosis. No joint deformity upper and lower extremities. Severe bilateral knee tenderness, no swelling, significant reduced ROM of bilateral knees.   Sin: Skin breakdown and sacral area Neurologic: CN 2-12 grossly intact. Sensation intact, DTR normal. Strength 5/5 in all 4.  Psychiatric: Normal judgment and insight. Alert and oriented x 3. Normal mood.     Labs on Admission: I have personally reviewed following labs and imaging studies  CBC: Recent Labs  Lab 11/19/20 1356  WBC 16.3*  NEUTROABS 14.2*  HGB 7.3*  HCT 22.9*  MCV 92.7  PLT 433   Basic Metabolic Panel: Recent Labs  Lab 11/19/20 1356  NA 140  K 3.1*  CL 102  CO2 22  GLUCOSE 119*  BUN 185*  CREATININE 6.20*  CALCIUM 8.4*   GFR: Estimated Creatinine Clearance: 8.2 mL/min (A) (by C-G formula based on SCr of 6.2 mg/dL (H)). Liver Function Tests: Recent Labs  Lab 11/19/20 1356  AST 40  ALT 37  ALKPHOS 78  BILITOT 0.6  PROT 6.2*  ALBUMIN 2.0*   No results for  input(s): LIPASE, AMYLASE in the last 168 hours. No results for input(s): AMMONIA in the last 168 hours. Coagulation Profile: No results for input(s): INR, PROTIME in the last 168 hours. Cardiac Enzymes: No  results for input(s): CKTOTAL, CKMB, CKMBINDEX, TROPONINI in the last 168 hours. BNP (last 3 results) No results for input(s): PROBNP in the last 8760 hours. HbA1C: No results for input(s): HGBA1C in the last 72 hours. CBG: No results for input(s): GLUCAP in the last 168 hours. Lipid Profile: No results for input(s): CHOL, HDL, LDLCALC, TRIG, CHOLHDL, LDLDIRECT in the last 72 hours. Thyroid Function Tests: No results for input(s): TSH, T4TOTAL, FREET4, T3FREE, THYROIDAB in the last 72 hours. Anemia Panel: Recent Labs    11/19/20 1539  RETICCTPCT 2.2   Urine analysis:    Component Value Date/Time   COLORURINE YELLOW 11/19/2020 1731   APPEARANCEUR HAZY (A) 11/19/2020 1731   LABSPEC 1.012 11/19/2020 1731   PHURINE 5.0 11/19/2020 1731   GLUCOSEU NEGATIVE 11/19/2020 1731   GLUCOSEU NEGATIVE 02/25/2019 1441   HGBUR SMALL (A) 11/19/2020 1731   BILIRUBINUR NEGATIVE 11/19/2020 1731   BILIRUBINUR Neg 05/22/2018 1505   KETONESUR NEGATIVE 11/19/2020 1731   PROTEINUR NEGATIVE 11/19/2020 1731   UROBILINOGEN 0.2 02/25/2019 1441   NITRITE NEGATIVE 11/19/2020 1731   LEUKOCYTESUR TRACE (A) 11/19/2020 1731    Radiological Exams on Admission: No results found.  EKG: Independently reviewed. Chronic LBBB.  Assessment/Plan Active Problems:   AKI (acute kidney injury) ([place])  (please populate well all problems here in Problem List. (For example, if patient is on BP meds at home and you resume or decide to hold them, it is a problem that needs to be her. Same for CAD, COPD, HLD and so on)  AKI on CKD stage IV -Has signs of dehydration probably from poor oral intake in the last few days, and an x-ray finding of possible congestion appears to be chronic, discussed with on-call nephrology  Dr. Jonnie Finner, plan gentle hydration, given patient also has concurrent hypoalbuminemia, will start colloid 25% of albumin every 6 hours x2 days.  Hold Lasix for today. -Dr. Burnett Sheng recommend reconsult nephrology day team tomorrow morning. -Ordered ultrasound of kidney, CK and uric acid level. -As of now, there is no significant indication for emergency HD. -Patient is sick with long term prognosis poor, given the already poor function status and worsening of kidney functions. Patient made aware.  Acute on chronic ambulation dysfunction/bilateral lower extremity weakness, bilateral knee pain -As my evaluation, she has significant bilateral lower extremity weakness because of her uncontrolled knee pain/OA, she missed B/L knee injection after February this year, and cardiology recommend no knee surgery given her significant cardiac risk. -bilateral knee pain is quite significant, even passive movement of her leg and triggered significant bilateral knee pain, on her knee x-ray in 2019, she had a severe degenerative knee structure with bone to bone and no cartilage left.  I have ordered a knee x-ray to rule out fracture, and I recommend she follow-up with her orthopedic surgery for regular knee injection. -I encouraged her to discuss with initiating chronic narcotic use.   -Right now I will use duloxetine and local cream. -Cervical spine and lumbar spine MRI ordered by ED physician, however I feel at this point her bilateral lower extremity weakness and knee pain can be explained by other etiology, and patient can not tolerate lying flat, so I canceled both MRI orders. -Appears that she will at least need some home care service, ordered PT evaluation and consult case management for SNF placement.  Acute on chronic normocytic anemia -Normal iron saturation, and low reticulocyte count indicating chronic anemia secondary to CKD likely. -PRBC x1 ordered in ED. -1 dose  of Epogen ordered.  Outpatient nephrology  follow-up for following doses.  Leukocytosis -No symptoms signs of pneumonia, UA pending, given the patient recent history of diaper use, suspect UTI, one dose of Ceftriaxone given. Re-dose if UA positive for UTI  Hypoalbuminemia -As above.  Chronic diastolic CHF -Symptoms and signs of hypovolemia> fluid overload which only shown on her chest x-ray, and clinically she appears to be dry, hydration strategy as above.  Chronic hypoxic respite failure -At her baseline, continue 2 L via nasal cannula.  Chronic cervical spine radiculopathy, chronic back pain -Significant changes, hold further MRI imaging reasoned as above.  Morbid obesity -Outpatient PCP follow-up   DVT prophylaxis: Heparin subQ Code Status: Full  Family Communication: Husband does not have a cell phone, but is on his way to the hospital Disposition Plan: Expect more than 2 midnight hospital stay Consults called: Neprhology Admission status: PCU   Lequita Halt MD [facility] Pager [phone]  11/19/2020, 6:13 PM

## 2020-11-19 NOTE — Progress Notes (Signed)
Pt brought to MRI for exams. Upon attempting, pt complains of pain and inability to lay flat. Unable to reach RN to provide meds in hopes to relax pt and assist with pain. Pt stated they would attempt without meds. Transferred pt onto scan table but immediately pt requested to be sat up. Unable to accommodate pt's desired position to achieve exams requested, pt stated they could not do the exam. Pt transfer back to stretcher and sent back to ED. Unable to scan pt in current condition.

## 2020-11-19 NOTE — ED Notes (Signed)
Patient transported to Ultrasound 

## 2020-11-20 ENCOUNTER — Inpatient Hospital Stay (HOSPITAL_COMMUNITY): Payer: Medicare Other

## 2020-11-20 DIAGNOSIS — L89300 Pressure ulcer of unspecified buttock, unstageable: Secondary | ICD-10-CM | POA: Insufficient documentation

## 2020-11-20 DIAGNOSIS — N184 Chronic kidney disease, stage 4 (severe): Secondary | ICD-10-CM

## 2020-11-20 DIAGNOSIS — R195 Other fecal abnormalities: Secondary | ICD-10-CM

## 2020-11-20 DIAGNOSIS — L8990 Pressure ulcer of unspecified site, unspecified stage: Secondary | ICD-10-CM | POA: Insufficient documentation

## 2020-11-20 LAB — CBC
HCT: 25.1 % — ABNORMAL LOW (ref 36.0–46.0)
Hemoglobin: 7.7 g/dL — ABNORMAL LOW (ref 12.0–15.0)
MCH: 28.8 pg (ref 26.0–34.0)
MCHC: 30.7 g/dL (ref 30.0–36.0)
MCV: 94 fL (ref 80.0–100.0)
Platelets: 287 10*3/uL (ref 150–400)
RBC: 2.67 MIL/uL — ABNORMAL LOW (ref 3.87–5.11)
RDW: 15.5 % (ref 11.5–15.5)
WBC: 14.3 10*3/uL — ABNORMAL HIGH (ref 4.0–10.5)
nRBC: 0.2 % (ref 0.0–0.2)

## 2020-11-20 LAB — BASIC METABOLIC PANEL
Anion gap: 17 — ABNORMAL HIGH (ref 5–15)
BUN: 189 mg/dL — ABNORMAL HIGH (ref 8–23)
CO2: 22 mmol/L (ref 22–32)
Calcium: 8.8 mg/dL — ABNORMAL LOW (ref 8.9–10.3)
Chloride: 103 mmol/L (ref 98–111)
Creatinine, Ser: 5.8 mg/dL — ABNORMAL HIGH (ref 0.44–1.00)
GFR, Estimated: 7 mL/min — ABNORMAL LOW (ref >60)
Glucose, Bld: 105 mg/dL — ABNORMAL HIGH (ref 70–99)
Potassium: 3.4 mmol/L — ABNORMAL LOW (ref 3.5–5.1)
Sodium: 142 mmol/L (ref 135–145)

## 2020-11-20 LAB — CK: Total CK: 147 U/L (ref 38–234)

## 2020-11-20 MED ORDER — DARBEPOETIN ALFA 60 MCG/0.3ML IJ SOSY
60.0000 ug | PREFILLED_SYRINGE | Freq: Once | INTRAMUSCULAR | Status: AC
  Administered 2020-11-21: 60 ug via INTRAVENOUS
  Filled 2020-11-20: qty 0.3

## 2020-11-20 MED ORDER — COLLAGENASE 250 UNIT/GM EX OINT
TOPICAL_OINTMENT | Freq: Every day | CUTANEOUS | Status: DC
  Filled 2020-11-20 (×2): qty 30

## 2020-11-20 MED ORDER — DARBEPOETIN ALFA 100 MCG/0.5ML IJ SOSY
100.0000 ug | PREFILLED_SYRINGE | INTRAMUSCULAR | Status: DC
  Administered 2020-11-28: 100 ug via SUBCUTANEOUS
  Filled 2020-11-20 (×2): qty 0.5

## 2020-11-20 MED ORDER — SODIUM CHLORIDE 0.9 % IV SOLN
125.0000 mg | Freq: Every day | INTRAVENOUS | Status: AC
  Administered 2020-11-20 – 2020-11-23 (×4): 125 mg via INTRAVENOUS
  Filled 2020-11-20 (×4): qty 10

## 2020-11-20 MED ORDER — LACTATED RINGERS IV SOLN: INTRAVENOUS | Status: AC

## 2020-11-20 NOTE — ED Notes (Signed)
Messaged pharmacy to send meds that are due at 1200.

## 2020-11-20 NOTE — Progress Notes (Signed)
PROGRESS NOTE    [name]  AES:[id number] DOB: [birth year] DOA: 11/19/2020 PCP: Martha Koch, MD   Brief Narrative:  HPI per Dr. Wynetta Fines on 11/19/20 HPI: [name] is a 75 y.o. female with medical history significant of CKD stage IV, chronic diastolic CHF with severe hypertrophic cardiomyopathy, chronic hypoxic respite failure with baseline 2 to 3 L all the time, severe AS status post biosynthetic aortic valve replacement, HTN, chronic cervical spine radiculopathy, chronic ambulation dysfunction secondary to chronic severe bilateral knee OA, Gout, frequent UTIs, morbid obesity, presented with worsening of bilateral knee pain and bilateral lower extremity weakness.   Patient has a baseline severe cervical spine radiculopathy and chronic neck pain, significantly patient has been wheelchair-bound for about 2 years because of bilateral severe knee OA and ambulation dysfunction.  He was told by his cardiology "no knee surgery for your heart problems". She used to get knee injection from orthopedic surgery every 8 to 10 weeks, and last injection was in February 2022.   Lately, his husband can no longer take care of her, and patient at baseline able to transfer herself from bed to wheelchair and go to bathroom, last week, she started wearing diapers, but after 2 days she developed rash and stopped using the diapers.  Family bought him a bedside commode chair, and she tried to use once and fell down the middle of night, denied any prodromes of lightheadedness blurry vision shortness of breath. She felt knee pain and weak, and she lied on the floor until morning.. She also has had significant decrease of p.o. intakes, she drinks only, one and half 1.6 oz cup of fluid a day.  She denies any cough, no chest pains, no diarrhea no urinary problems and no fever chills.   She has a CKD stage IV, follows with nephrology Dr. Hollie Salk every 6 months, last visit was earlier this year, but she was not sure  whether blood work was done on that visit.   Patient denied any abdominal pain, no dark-colored stool, no nauseous or vomiting.   ED Course: Patient was found to have anemia creatinine 7.3, WBC 16.3, creatinine 6.2 compared to baseline 2.3 last year, BUN 185, bicarb 22, K3.1.   ED concerned about worsening of bilateral lower extremity weakness, MRI cervical spine and lumbar spine ordered however patient did not tolerate lying flat.  **Interim History Renal Fxn mimimally improving so will conslut Nephrology and they have placed her back on IVF.   Assessment & Plan:   Active Problems:   Symptomatic anemia   AKI (acute kidney injury) ([place])   Chronic kidney disease   Pressure injury of skin  AKI on CKD stage IV -Has signs of dehydration probably from poor oral intake in the last few days, and an x-ray finding of possible congestion appears to be chronic,  -Dr. Roosevelt Locks discussed with on-call nephrology Dr. Jonnie Finner, plan gentle hydration, given patient also has concurrent hypoalbuminemia, will start colloid 25% of albumin every 6 hours x2 days.  Hold Lasix for today. -Dr. Burnett Sheng recommended [facility] and it has been done today  -Ordered ultrasound of kidney, CK and uric acid level. -As of now, there is no significant indication for emergency HD. -Patient is sick with long term prognosis poor, given the already poor function status and worsening of kidney functions. Patient made aware. -Nephrology consulted and Dr. Posey Pronto recommending to Ringer's at 100 MLS per hour and then discontinue after 2 L or if she has clinical  evidence of volume excess -Nephrology also recommends avoiding nephrotoxic medications including NSAIDs and iodinated intravenous contrast exposure unless this is absolutely necessary next-recommended the prefer narcotic for pain control with hydromorphone, fentanyl methadone and morphine should not be used and they are recommending avoiding baclofen and  avoiding oral sodium phosphate and magnesium citrate based laxatives -We will do strict I's and O's and daily weights -Continue monitor and trend renal function carefully and appreciate nephrology recommendations   Acute on chronic ambulation dysfunction/bilateral lower extremity weakness, bilateral knee pain -As my evaluation, she has significant bilateral lower extremity weakness because of her uncontrolled knee pain/OA, she missed B/L knee injection after February this year, and cardiology recommend no knee surgery given her significant cardiac risk. -bilateral knee pain is quite significant, even passive movement of her leg and triggered significant bilateral knee pain, on her knee x-ray in 2019, she had a severe degenerative knee structure with bone to bone and no cartilage left. Ordered a knee x-ray to rule out fracture, and recommend she follow-up with her orthopedic surgery for regular knee injection. -Encouraged her to discuss with initiating chronic narcotic use.   -Right now I will use duloxetine and local cream. -Cervical spine and lumbar spine MRI ordered by ED physician, however I feel at this point her bilateral lower extremity weakness and knee pain can be explained by other etiology, and patient can not tolerate lying flat, so I canceled both MRI orders. -Appears that she will at least need some home care service, ordered PT evaluation and consult case management for SNF placement.   Acute on chronic normocytic anemia -Normal iron saturation, and low reticulocyte count indicating chronic anemia secondary to CKD likely. -PRBC x1 ordered in ED. -1 dose of Epogen ordered.   -Anemia panel done and showed an iron level of 20, U IBC of 81, TIBC of 101, saturation ratios of 20%, ferritin level 1097, folate of 10.9, vitamin B12 of 474 -Patient hemoglobin/hematocrit went from 7.3/22.9 is now 7.7/20 5.1 -nephrology consulted for further evaluation and they are going to start the patient on  Versed 500 mg over 4 doses and begin ESA as mentioned -FOBT was positive and will discuss with GI about formal consultation  -Continue to monitor and trend   Leukocytosis -No symptoms signs of pneumonia, UA pending, given the patient recent history of diaper use, suspect UTI, one dose of Ceftriaxone given. Re-dose if UA positive for UTI -She continues to have some diarrhea and loose stools with a positive FOBT -He continues to have some loose stools we will check C. difficile and a GI pathogen panel -Continue to monitor for signs and symptoms of infection -Repeat CBC in a.m.   Hypoalbuminemia -As above.   Chronic Diastolic CHF -Symptoms and signs of hypovolemia> fluid overload which only shown on her chest x-ray, and clinically she appears to be dry, hydration strategy per Nephrology -Stric I's and O's and Daily Weights   Chronic Hypoxic Respiratory Failure -At her baseline, continue 2 L via nasal cannula. -SpO2: 96 % O2 Flow Rate (L/min): 2 L/min -Continue to Monitor Respiratory    Chronic cervical spine radiculopathy, chronic back pain -Significant changes, hold further MRI imaging reasoned as above.   Morbid Obesity -Complicates overall prognosis and care -Estimated body mass index is 27.44 kg/m as calculated from the following:   Height as of this encounter: 5\' 6"  (1.676 m).   Weight as of this encounter: 77.1 kg. -Weight Loss and Dietary Counseling given   Left Lower Buttock  Stage 3 Pressure Injury and Right Lower and Upper Unstagable Pressure Injury -WOC  DVT prophylaxis: SCDs Code Status: FULL CODE  Family Communication: No family present at bedside  Disposition Plan: Pending further clinical Improvement   Status is: Inpatient  Remains inpatient appropriate because:Unsafe d/c plan, IV treatments appropriate due to intensity of illness or inability to take PO, and Inpatient level of care appropriate due to severity of illness  Dispo: The patient is from: Home               Anticipated d/c is to:  TBD              Patient currently is not medically stable to d/c.   Difficult to place patient No  Consultants:  Nephrology   Procedures: None  Antimicrobials:  Anti-infectives (From admission, onward)    Start     Dose/Rate Route Frequency Ordered Stop   11/19/20 2057  cefTRIAXone (ROCEPHIN) 1 g in sodium chloride 0.9 % 100 mL IVPB        1 g 200 mL/hr over 30 Minutes Intravenous  Once 11/19/20 1847 11/19/20 2200        Subjective: Seen and examined at bedside and was still feeling very weak.  No chest pain or shortness of breath.  Still having quite a bit of diarrhea.  No other concerns or complaints at this time.  Objective: Vitals:   11/20/20 1400 11/20/20 1500 11/20/20 1700 11/20/20 1800  BP: 125/62 127/66 125/61 119/65  Pulse: 86 85 82 80  Resp: (!) 27 (!) 32 (!) 33 (!) 24  Temp:      TempSrc:      SpO2: 99% 100% 99% 96%  Weight:      Height:        Intake/Output Summary (Last 24 hours) at 11/20/2020 1818 Last data filed at 11/20/2020 1449 Gross per 24 hour  Intake 792.03 ml  Output --  Net 792.03 ml   Filed Weights   11/19/20 1217  Weight: 77.1 kg   Examination: Physical Exam:  Constitutional: WN/WD overweight chronically ill-appearing African-American female currently in NAD and appears calm and comfortable Eyes: Lids and conjunctivae normal, sclerae anicteric  ENMT: External Ears, Nose appear normal. Grossly normal hearing.  Neck: Appears normal, supple, no cervical masses, normal ROM, no appreciable thyromegaly; no JVD Respiratory: Diminished to auscultation bilaterally, no wheezing, rales, rhonchi or crackles. Normal respiratory effort and patient is not tachypenic. No accessory muscle use. Unlabored Breathing but wearing supplemental O2 via [place] Cardiovascular: RRR, no murmurs / rubs / gallops. S1 and S2 auscultated. 1+ LE Edema  Abdomen: Soft, non-tender, Distended 2/2 body habitus. Bowel sounds positive.  GU:  Deferred. Musculoskeletal: No clubbing / cyanosis of digits/nails. No joint deformity upper and lower extremities.  Skin: No rashes, lesions, ulcers on a limited skin evaluation. No induration; Warm and dry.  Neurologic: CN 2-12 grossly intact with no focal deficits. Romberg sign and cerebellar reflexes not assessed.  Psychiatric: Normal judgment and insight. Alert and oriented x 3. Anxious mood and appropriate affect.   Data Reviewed: I have personally reviewed following labs and imaging studies  CBC: Recent Labs  Lab 11/19/20 1356 11/20/20 0645  WBC 16.3* 14.3*  NEUTROABS 14.2*  --   HGB 7.3* 7.7*  HCT 22.9* 25.1*  MCV 92.7 94.0  PLT 330 147   Basic Metabolic Panel: Recent Labs  Lab 11/19/20 1356 11/20/20 0645  NA 140 142  K 3.1* 3.4*  CL 102 103  CO2 22  22  GLUCOSE 119* 105*  BUN 185* 189*  CREATININE 6.20* 5.80*  CALCIUM 8.4* 8.8*   GFR: Estimated Creatinine Clearance: 8.8 mL/min (A) (by C-G formula based on SCr of 5.8 mg/dL (H)). Liver Function Tests: Recent Labs  Lab 11/19/20 1356  AST 40  ALT 37  ALKPHOS 78  BILITOT 0.6  PROT 6.2*  ALBUMIN 2.0*   No results for input(s): LIPASE, AMYLASE in the last 168 hours. No results for input(s): AMMONIA in the last 168 hours. Coagulation Profile: No results for input(s): INR, PROTIME in the last 168 hours. Cardiac Enzymes: No results for input(s): CKTOTAL, CKMB, CKMBINDEX, TROPONINI in the last 168 hours. BNP (last 3 results) No results for input(s): PROBNP in the last 8760 hours. HbA1C: No results for input(s): HGBA1C in the last 72 hours. CBG: No results for input(s): GLUCAP in the last 168 hours. Lipid Profile: No results for input(s): CHOL, HDL, LDLCALC, TRIG, CHOLHDL, LDLDIRECT in the last 72 hours. Thyroid Function Tests: No results for input(s): TSH, T4TOTAL, FREET4, T3FREE, THYROIDAB in the last 72 hours. Anemia Panel: Recent Labs    11/19/20 1539  VITAMINB12 474  FOLATE 10.9  FERRITIN 1,097*   TIBC 101*  IRON 20*  RETICCTPCT 2.2   Sepsis Labs: Recent Labs  Lab 11/19/20 1821  LATICACIDVEN 0.7    Recent Results (from the past 240 hour(s))  Resp Panel by RT-PCR (Flu A&B, Covid) Nasopharyngeal Swab     Status: None   Collection Time: 11/19/20  5:19 PM   Specimen: Nasopharyngeal Swab; Nasopharyngeal(NP) swabs in vial transport medium  Result Value Ref Range Status   SARS Coronavirus 2 by RT PCR NEGATIVE NEGATIVE Final    Comment: (NOTE) SARS-CoV-2 target nucleic acids are NOT DETECTED.  The SARS-CoV-2 RNA is generally detectable in upper respiratory specimens during the acute phase of infection. The lowest concentration of SARS-CoV-2 viral copies this assay can detect is 138 copies/mL. A negative result does not preclude SARS-Cov-2 infection and should not be used as the sole basis for treatment or other patient management decisions. A negative result may occur with  improper specimen collection/handling, submission of specimen other than nasopharyngeal swab, presence of viral mutation(s) within the areas targeted by this assay, and inadequate number of viral copies(<138 copies/mL). A negative result must be combined with clinical observations, patient history, and epidemiological information. The expected result is Negative.  Fact Sheet for Patients:  EntrepreneurPulse.com.au  Fact Sheet for Healthcare Providers:  IncredibleEmployment.be  This test is no t yet approved or cleared by the [place] FDA and  has been authorized for detection and/or diagnosis of SARS-CoV-2 by FDA under an Emergency Use Authorization (EUA). This EUA will remain  in effect (meaning this test can be used) for the duration of the COVID-19 declaration under Section 564(b)(1) of the Act, 21 U.S.C.section 360bbb-3(b)(1), unless the authorization is terminated  or revoked sooner.       Influenza A by PCR NEGATIVE NEGATIVE Final   Influenza B by PCR  NEGATIVE NEGATIVE Final    Comment: (NOTE) The Xpert Xpress SARS-CoV-2/FLU/RSV plus assay is intended as an aid in the diagnosis of influenza from Nasopharyngeal swab specimens and should not be used as a sole basis for treatment. Nasal washings and aspirates are unacceptable for Xpert Xpress SARS-CoV-2/FLU/RSV testing.  Fact Sheet for Patients: EntrepreneurPulse.com.au  Fact Sheet for Healthcare Providers: IncredibleEmployment.be  This test is not yet approved or cleared by the [place] FDA and has been authorized for detection and/or diagnosis  of SARS-CoV-2 by FDA under an Emergency Use Authorization (EUA). This EUA will remain in effect (meaning this test can be used) for the duration of the COVID-19 declaration under Section 564(b)(1) of the Act, 21 U.S.C. section 360bbb-3(b)(1), unless the authorization is terminated or revoked.  Performed at [facility], [place] [address] [place], [place] [address]      RN Pressure Injury Documentation: Pressure Injury 11/20/20 Upper;Lower;Left Stage 3 -  Full thickness tissue loss. Subcutaneous fat may be visible but bone, tendon or muscle are NOT exposed. (Active)  11/20/20   Location:   Location Orientation: Upper;Lower;Left  Staging: Stage 3 -  Full thickness tissue loss. Subcutaneous fat may be visible but bone, tendon or muscle are NOT exposed.  Wound Description (Comments):   Present on Admission: Yes     Pressure Injury 11/20/20 Right;Lower;Upper Unstageable - Full thickness tissue loss in which the base of the injury is covered by slough (yellow, tan, gray, green or brown) and/or eschar (tan, brown or black) in the wound bed. (Active)  11/20/20   Location:   Location Orientation: Right;Lower;Upper  Staging: Unstageable - Full thickness tissue loss in which the base of the injury is covered by slough (yellow, tan, gray, green or brown) and/or eschar (tan, brown or black) in the wound  bed.  Wound Description (Comments):   Present on Admission: Yes     Estimated body mass index is 27.44 kg/m as calculated from the following:   Height as of this encounter: 5\' 6"  (1.676 m).   Weight as of this encounter: 77.1 kg.  Malnutrition Type:   Malnutrition Characteristics:   Nutrition Interventions:    Radiology Studies: DG Knee 1-2 Views Left  Result Date: 11/19/2020 CLINICAL DATA:  Pain. EXAM: LEFT KNEE - 1-2 VIEW COMPARISON:  10/22/2017. FINDINGS: Advanced tricompartmental degenerative osteoarthritic changes of the LEFT knee, with significant joint space loss and degenerative osteophyte formation. These degenerative changes are not significantly changed compared to the previous plain film of 10/22/2017. No fracture line or displaced fracture fragment. No acute-appearing cortical irregularity or osseous lesion. Probable joint effusion within the suprapatellar bursa. Superficial soft tissues about the LEFT knee are unremarkable. IMPRESSION: 1. Advanced degenerative osteoarthritic changes of the LEFT knee, not significantly changed compared to the previous plain film of 10/22/2017. 2. No acute findings. Electronically Signed   By: Franki Cabot M.D.   On: 11/19/2020 18:14   DG Knee 1-2 Views Right  Result Date: 11/19/2020 CLINICAL DATA:  Pain. EXAM: RIGHT KNEE - 1-2 VIEW COMPARISON:  Plain film of the RIGHT knee dated 10/22/2017. FINDINGS: Advanced degenerative osteoarthritic changes of the RIGHT knee, tricompartmental but most severe at the medial compartment, with associated joint space narrowing and osteophyte formation. No fracture line or displaced fracture fragment. No acute-appearing cortical irregularity or osseous lesion. Probable small joint effusion within the suprapatellar bursa, likely chronic. Superficial soft tissues about the RIGHT knee are unremarkable. IMPRESSION: 1. Advanced degenerative osteoarthritic changes of the RIGHT knee, tricompartmental but most severe at  the medial compartment, not significantly changed compared to previous plain film of 10/22/2017. 2. Probable small joint effusion, likely chronic. 3. No acute findings. Electronically Signed   By: Franki Cabot M.D.   On: 11/19/2020 18:16   US RENAL  Result Date: 11/19/2020 CLINICAL DATA:  Acute kidney injury. EXAM: RENAL / URINARY TRACT ULTRASOUND COMPLETE COMPARISON:  Noncontrast CT 05/28/2017 FINDINGS: Right Kidney: Renal measurements: 10.5 x 4.3 x 3.9 cm = volume: 92 mL. No hydronephrosis. Increased  renal parenchymal echogenicity. 1 cm cyst in the anterior superior kidney. No visualized stone or solid lesion. Left Kidney: Renal measurements: 8.1 x 3.9 x 4.9 cm = volume: 81 mL. No hydronephrosis. Diffusely increased renal parenchymal echogenicity. Exophytic cyst arising from the lower kidney measures 5.9 x 4.9 x 5.4 cm. No visualized stone or solid lesion. Bladder: Appears normal for degree of bladder distention. Both ureteral jets are seen. Other: None. IMPRESSION: 1. No hydronephrosis or obstructive uropathy. 2. Increased renal parenchymal echogenicity typical of chronic medical renal disease. 3. Bilateral renal cysts. Electronically Signed   By: Keith Rake M.D.   On: 11/19/2020 20:00   DG Chest Port 1 View  Result Date: 11/20/2020 CLINICAL DATA:  Shortness of breath. EXAM: PORTABLE CHEST 1 VIEW COMPARISON:  Chest x-ray dated March 08, 2020. FINDINGS: Stable cardiomegaly status post AVR. Chronic mild pulmonary vascular congestion. No focal consolidation, pleural effusion, or pneumothorax. No acute osseous abnormality. IMPRESSION: 1. Chronic mild pulmonary vascular congestion. Electronically Signed   By: Titus Dubin M.D.   On: 11/20/2020 07:31    Scheduled Meds:  amLODipine  5 mg Oral QHS   aspirin EC  81 mg Oral Daily   calcitRIOL  0.25 mcg Oral Once per day on Mon Fri   capsicum   Topical BID   carvedilol  25 mg Oral BID WC   collagenase   Topical Daily   [START ON 11/21/2020]  darbepoetin (ARANESP) injection - NON-DIALYSIS  100 mcg Subcutaneous Q Tue-1800   DULoxetine  30 mg Oral Daily   gabapentin  100 mg Oral QHS   heparin injection (subcutaneous)  5,000 Units Subcutaneous Q12H   multivitamin with minerals  1 tablet Oral Daily   Continuous Infusions:  albumin human Stopped (11/20/20 1435)   ferric gluconate (FERRLECIT) IVPB     lactated ringers      LOS: 1 day   Kerney Elbe, DO [facility] PAGER is on [place]  If 7PM-7AM, please contact night-coverage [email or web address]

## 2020-11-20 NOTE — ED Notes (Signed)
Cleaned pt up and changed sheets.

## 2020-11-20 NOTE — Progress Notes (Signed)
Patient transferred from ED to [phone]. Enteric isolation precautions continued. Patient is alert and oriented to person, place, time, and situation. Telemetry monitoring enabled, vital signs taken, and IVs assessed for patency. Skin checked with Darrold Span. Fall precautions initiated. Patient bed in the locked, lowest position. Non-slip socks in place and bed alarm on. Call bell is within reach. Patient knows to call for assistance prior to getting up and patient demonstrates use. Patient is laying comfortably in bed.

## 2020-11-20 NOTE — Consult Note (Addendum)
Reason for Consult: Acute kidney injury on chronic kidney disease stage IV Referring Physician: Osborne Oman, MD [facility])  HPI:  75 year old African-American woman with past medical history significant for hypertension, dyslipidemia, ascending aortic dissection status postrepair and bioprosthetic aortic valve replacement in February, 2019 for severe AS, chronic diastolic heart failure with history of recurrent pleural effusions, history of pericardial effusion, osteoarthritis of the knees, degenerative joint disease, obesity, pulmonary hypertension and chronic kidney disease stage IV at baseline with creatinine seemingly 2.4-2.6 when last seen by nephrology (Dr. Hollie Salk) in November, 2021.  She presented to the emergency room yesterday with complaints of feeling sick and worsening of bilateral knee pain/lower extremity weakness.  She has baseline severe C-spine radiculopathy with chronic neck pain and has been wheelchair-bound for the last 2 years for ambulation.  She has had increasing difficulty transferring herself with her husband no longer to be able to care for her.  She recently suffered a fall (mechanical) and laid on the floor overnight and has had decreasing oral intake since then.  Denies any nausea or vomiting but appears to have diarrhea since presentation to the emergency room.  Concern is also raised with pressure injuries over both buttocks for which she was seen by wound care.  She feels that she has been feeling short of oral intake that explains her decreased renal function.  She denies any dysuria, urgency, frequency, flank pain, fever but reports nocturia and cold intolerance without chills.  She denies any cough or sputum production and does not have any chest pain.  She reports some mild shortness of breath when seen in the emergency room.  Renal ultrasound without any obstruction but shows features consistent with chronic kidney disease.  Past Medical History:  Diagnosis Date   Acute  pancreatitis 02/04/2017   [name] 02/04/2017   Adenomatous colon polyp 01/2002   Anemia, chronic disease    /notes 02/04/2017   Aortic valve disease    AI/AS   Arthritis    "lower back, knees" (02/04/2017)   CHF (congestive heart failure) (HCC)    CKD (chronic kidney disease), stage III (HCC)    Diverticulosis of colon    DJD (degenerative joint disease)    Frequent UTI    Hemorrhoids    Hypertension    Lumbar back pain    Neuropathy    Obesity    Pericardial effusion     in a patient with Diastolic heart  failure and Pericardial effusion  known since last 2 D echo in 11/2016 /notes 02/04/2017   PONV (postoperative nausea and vomiting)    Pulmonary HTN (HCC)    Renal cyst    Venous insufficiency    Vitamin D deficiency     Past Surgical History:  Procedure Laterality Date   ABDOMINAL HYSTERECTOMY     "partial"   AORTIC VALVE REPLACEMENT N/A 05/30/2017   Procedure: AORTIC VALVE REPLACEMENT (AVR);  Surgeon: Prescott Gum, Collier Salina, MD;  Location: [place];  Service: Vascular;  Laterality: N/A;   COLONOSCOPY W/ BIOPSIES AND POLYPECTOMY     "bx was ok"   IR THORACENTESIS ASP PLEURAL SPACE W/IMG GUIDE  06/11/2017   IR THORACENTESIS ASP PLEURAL SPACE W/IMG GUIDE  06/12/2017   REPAIR OF ACUTE ASCENDING THORACIC AORTIC DISSECTION N/A 05/30/2017   Procedure: REPLACEMENT OF ASCENDING AORTIC ANEURYSM AND REPAIRED CHRONIC ROOT DISSECTION WITH CIRC ARREST;  Surgeon: Ivin Poot, MD;  Location: [place];  Service: Vascular;  Laterality: N/A;   TEE WITHOUT CARDIOVERSION N/A 05/28/2017  Procedure: TRANSESOPHAGEAL ECHOCARDIOGRAM (TEE);  Surgeon: Skeet Latch, MD;  Location: [place];  Service: Cardiovascular;  Laterality: N/A;   TEE WITHOUT CARDIOVERSION N/A 05/30/2017   Procedure: TRANSESOPHAGEAL ECHOCARDIOGRAM (TEE);  Surgeon: Prescott Gum, Collier Salina, MD;  Location: [place];  Service: Open Heart Surgery;  Laterality: N/A;    Family History  Problem Relation Age of Onset   Diabetes Mother    Colon  polyps Mother    Hypertension Mother    Hypertension Brother    Hypertension Sister    Colon cancer Neg Hx     Social History:  reports that she quit smoking about 38 years ago. Her smoking use included cigarettes. She has a 6.00 pack-year smoking history. She has never used smokeless tobacco. She reports that she does not drink alcohol and does not use drugs.  Allergies:  Allergies  Allergen Reactions   Methylprednisolone Other (See Comments)    Caused severe GERD in October 2018   Minoxidil Palpitations and Other (See Comments)    unable to sleep   Naproxen Swelling    Medications: I have reviewed the patient's current medications. Scheduled:  amLODipine  5 mg Oral QHS   aspirin EC  81 mg Oral Daily   calcitRIOL  0.25 mcg Oral Once per day on Mon Fri   capsicum   Topical BID   carvedilol  25 mg Oral BID WC   collagenase   Topical Daily   DULoxetine  30 mg Oral Daily   gabapentin  100 mg Oral QHS   heparin injection (subcutaneous)  5,000 Units Subcutaneous Q12H   multivitamin with minerals  1 tablet Oral Daily    BMP Latest Ref Rng & Units 11/20/2020 11/19/2020 07/29/2019  Glucose 70 - 99 mg/dL 105(H) 119(H) 98  BUN 8 - 23 mg/dL 189(H) 185(H) 53(H)  Creatinine 0.44 - 1.00 mg/dL 5.80(H) 6.20(H) 2.37(H)  BUN/Creat Ratio 12 - 28 - - -  Sodium 135 - 145 mmol/L 142 140 140  Potassium 3.5 - 5.1 mmol/L 3.4(L) 3.1(L) 3.7  Chloride 98 - 111 mmol/L 103 102 104  CO2 22 - 32 mmol/L 22 22 27   Calcium 8.9 - 10.3 mg/dL 8.8(L) 8.4(L) 9.6   CBC Latest Ref Rng & Units 11/20/2020 11/19/2020 07/29/2019  WBC 4.0 - 10.5 K/uL 14.3(H) 16.3(H) 8.0  Hemoglobin 12.0 - 15.0 g/dL 7.7(L) 7.3(L) 8.4 Repeated and verified X2.(L)  Hematocrit 36.0 - 46.0 % 25.1(L) 22.9(L) 25.4 Repeated and verified X2.(L)  Platelets 150 - 400 K/uL 287 330 287.0     DG Knee 1-2 Views Left  Result Date: 11/19/2020 CLINICAL DATA:  Pain. EXAM: LEFT KNEE - 1-2 VIEW COMPARISON:  10/22/2017. FINDINGS: Advanced  tricompartmental degenerative osteoarthritic changes of the LEFT knee, with significant joint space loss and degenerative osteophyte formation. These degenerative changes are not significantly changed compared to the previous plain film of 10/22/2017. No fracture line or displaced fracture fragment. No acute-appearing cortical irregularity or osseous lesion. Probable joint effusion within the suprapatellar bursa. Superficial soft tissues about the LEFT knee are unremarkable. IMPRESSION: 1. Advanced degenerative osteoarthritic changes of the LEFT knee, not significantly changed compared to the previous plain film of 10/22/2017. 2. No acute findings. Electronically Signed   By: Franki Cabot M.D.   On: 11/19/2020 18:14   DG Knee 1-2 Views Right  Result Date: 11/19/2020 CLINICAL DATA:  Pain. EXAM: RIGHT KNEE - 1-2 VIEW COMPARISON:  Plain film of the RIGHT knee dated 10/22/2017. FINDINGS: Advanced degenerative osteoarthritic changes of the RIGHT knee, tricompartmental  but most severe at the medial compartment, with associated joint space narrowing and osteophyte formation. No fracture line or displaced fracture fragment. No acute-appearing cortical irregularity or osseous lesion. Probable small joint effusion within the suprapatellar bursa, likely chronic. Superficial soft tissues about the RIGHT knee are unremarkable. IMPRESSION: 1. Advanced degenerative osteoarthritic changes of the RIGHT knee, tricompartmental but most severe at the medial compartment, not significantly changed compared to previous plain film of 10/22/2017. 2. Probable small joint effusion, likely chronic. 3. No acute findings. Electronically Signed   By: Franki Cabot M.D.   On: 11/19/2020 18:16   US RENAL  Result Date: 11/19/2020 CLINICAL DATA:  Acute kidney injury. EXAM: RENAL / URINARY TRACT ULTRASOUND COMPLETE COMPARISON:  Noncontrast CT 05/28/2017 FINDINGS: Right Kidney: Renal measurements: 10.5 x 4.3 x 3.9 cm = volume: 92 mL. No  hydronephrosis. Increased renal parenchymal echogenicity. 1 cm cyst in the anterior superior kidney. No visualized stone or solid lesion. Left Kidney: Renal measurements: 8.1 x 3.9 x 4.9 cm = volume: 81 mL. No hydronephrosis. Diffusely increased renal parenchymal echogenicity. Exophytic cyst arising from the lower kidney measures 5.9 x 4.9 x 5.4 cm. No visualized stone or solid lesion. Bladder: Appears normal for degree of bladder distention. Both ureteral jets are seen. Other: None. IMPRESSION: 1. No hydronephrosis or obstructive uropathy. 2. Increased renal parenchymal echogenicity typical of chronic medical renal disease. 3. Bilateral renal cysts. Electronically Signed   By: Keith Rake M.D.   On: 11/19/2020 20:00   DG Chest Port 1 View  Result Date: 11/20/2020 CLINICAL DATA:  Shortness of breath. EXAM: PORTABLE CHEST 1 VIEW COMPARISON:  Chest x-ray dated March 08, 2020. FINDINGS: Stable cardiomegaly status post AVR. Chronic mild pulmonary vascular congestion. No focal consolidation, pleural effusion, or pneumothorax. No acute osseous abnormality. IMPRESSION: 1. Chronic mild pulmonary vascular congestion. Electronically Signed   By: Titus Dubin M.D.   On: 11/20/2020 07:31    Review of Systems  Constitutional:  Positive for activity change, appetite change and fatigue. Negative for chills and fever.  HENT:  Negative for facial swelling, nosebleeds, sinus pressure, sinus pain and sore throat.   Eyes:  Negative for photophobia, redness and visual disturbance.  Respiratory:  Positive for shortness of breath. Negative for cough and chest tightness.   Cardiovascular:  Negative for chest pain and leg swelling.  Gastrointestinal:  Positive for diarrhea. Negative for abdominal distention, abdominal pain, blood in stool, nausea and vomiting.       Diarrhea while here in the emergency room  Endocrine: Positive for cold intolerance. Negative for heat intolerance.  Genitourinary:  Negative for  dysuria, flank pain, frequency and hematuria.  Musculoskeletal:  Positive for arthralgias, back pain, gait problem and myalgias.       Pain in both of her knees with weakness of the lower extremities  Neurological:  Positive for weakness. Negative for light-headedness and headaches.  Blood pressure 125/62, pulse 86, temperature 98.4 F (36.9 C), temperature source Oral, resp. rate (!) 27, height 5\' 6"  (1.676 m), weight 77.1 kg, SpO2 99 %. Physical Exam Constitutional:      Appearance: She is obese. She is ill-appearing. She is not toxic-appearing.  HENT:     Head: Normocephalic and atraumatic.     Right Ear: External ear normal.     Left Ear: External ear normal.     Nose: Nose normal.     Mouth/Throat:     Mouth: Mucous membranes are dry.     Pharynx: No oropharyngeal exudate.  Eyes:     General: No scleral icterus.    Extraocular Movements: Extraocular movements intact.     Conjunctiva/sclera: Conjunctivae normal.  Cardiovascular:     Rate and Rhythm: Normal rate and regular rhythm.     Pulses: Normal pulses.     Heart sounds: No murmur heard. Pulmonary:     Effort: Pulmonary effort is normal.     Breath sounds: Normal breath sounds. No wheezing or rales.  Abdominal:     General: Bowel sounds are normal. There is distension.     Palpations: Abdomen is soft.     Tenderness: There is no guarding.     Comments: Moderate distention-obesity  Musculoskeletal:     Cervical back: Normal range of motion and neck supple.     Right lower leg: No edema.     Left lower leg: No edema.  Skin:    General: Skin is warm and dry.  Neurological:     Mental Status: She is alert and oriented to person, place, and time.  Psychiatric:        Mood and Affect: Mood normal.    Assessment/Plan: 1.  Acute kidney injury on chronic kidney disease stage IV: Unclear whether this is entirely acute versus progression of her underlying chronic kidney disease given paucity of data/follow-up over the last  8 months.  She appears to have a prominent hemodynamic component leading to her visit today with some improvement overnight with intravenous fluids however, there is need to rule out rhabdomyolysis.  The big question is to how much this will be reversible as with her current physical/mobility challenges, chronic hemodialysis may likely significantly worsen her quality of life (I discussed this with both the patient and her husband although, fear that she has not quite understood the gravity of the situation).  She has severe azotemia without any uremic signs or symptoms at this time and chest x-ray showed chronic mild pulmonary vascular congestion without any significant volume overload/pulmonary edema.  I will place her on LR 100 cc/h with the option to discontinue this after 2 L are given or she has clinically evident volume excess (in the setting of diastolic heart failure). Avoid nephrotoxic medications including NSAIDs and iodinated intravenous contrast exposure unless the latter is absolutely indicated.  Preferred narcotic agents for pain control are hydromorphone, fentanyl, and methadone. Morphine should not be used. Avoid Baclofen and avoid oral sodium phosphate and magnesium citrate based laxatives / bowel preps. Continue strict Input and Output monitoring. Will monitor the patient closely with you and intervene or adjust therapy as indicated by changes in clinical status/labs. 2.  Hypokalemia: Possibly secondary to ongoing losses from diuretic use/poor oral intake and now diarrhea.  Agree with cautious supplementation. 3.  Chronic hypoxic respiratory failure: She appears to be at baseline with regards to her respiratory status on oxygen supplementation at 2 L/min via nasal cannula. 4.  Anemia of chronic kidney disease/chronic illness: With iron deficiency but elevated ferritin that limits extent of iron loading- I will order for Nulecit 500 mg over 4 doses and begin ESA.  She is status post PRBC  transfusion overnight. 5.  Chronic cervical spine radiculopathy with chronic back pain with acute on chronic ambulation dysfunction: Suspect that with her limitations and current social support, she will unlikely be able to return home and may need placement at a skilled nursing facility.   Murrel Bertram K. 11/20/2020, 3:18 PM

## 2020-11-20 NOTE — Progress Notes (Signed)
Physical Therapy Evaluation Patient Details Name: [name] MRN: [id number] DOB: [birth year] Today's Date: 11/20/2020   History of Present Illness  Pt is a 75yo female presenting [facility] ED on [facility]/14 with neck and bilteral lower extremity pain secondary to a fall at home while using BSC. Xray revealed chronic osteoarthritis of both knees present in previous imaging. Current diagnosis of acute kidney injury. Pt on 2-3L of O2 24/7 at home. PMH: anemia, CKD4, CHF, HTN, gout, aortic stenosis.   Clinical Impression  Pt presents with the impairments above and problems below.  When attempting bed mobility, pt with heavy posterior lean and very resistive and was unable to achieve upright positioning. Required total assist +2 secondary to pain and anxiety. Educated pt on the importance of daily movement to reduce stiffness and pain, as well as regularly hydrating. Discussed recommendations for SNF-level therapies and pt agreeable. We will continue to follow the pt acutely to promote independence with functional mobility.    Follow Up Recommendations SNF;Supervision/Assistance - 24 hour    Equipment Recommendations  Rolling walker with 5" wheels;3in1 (PT);Wheelchair (measurements PT);Wheelchair cushion (measurements PT);Hospital bed (Likely require bariatric 3:1 secondary to recent collapse of BSC)    Recommendations for Other Services       Precautions / Restrictions Precautions Precautions: Fall Precaution Comments: Fell at home while on [facility] Restrictions Weight Bearing Restrictions: No      Mobility  Bed Mobility Overal bed mobility: Needs Assistance Bed Mobility: Supine to Sit     Supine to sit: +2 for physical assistance;Total assist     General bed mobility comments: Pt required total assist +2 for LE advancement and trunk control. Exhibited strong posterior lean during supine to sit and pt groaning and requesting to "wait a minute" often during mobility. Pt required her hands to be  grasping something so she felt prepared to move. Throughout mobility, pt guarding and resisting movement. Further functional mobility assessment deferred secondary to pt's pain and anxiety.    Transfers                    Ambulation/Gait                Stairs            Wheelchair Mobility    Modified Rankin (Stroke Patients Only)       Balance Overall balance assessment: History of Falls;Needs assistance Sitting-balance support: Bilateral upper extremity supported Sitting balance-Leahy Scale: Zero Sitting balance - Comments: Heavy posterior lean as pt resistive to mobility. At least max to total A to maintain sitting balance.                                     Pertinent Vitals/Pain Pain Assessment: 0-10 Pain Score: 8  Pain Location: BL knees Pain Descriptors / Indicators: Grimacing;Guarding;Heaviness Pain Intervention(s): Limited activity within patient's tolerance;Monitored during session;Repositioned    Home Living Family/patient expects to be discharged to:: Private residence Living Arrangements: Spouse/significant other Available Help at Discharge: Family;Available 24 hours/day Type of Home: House Home Access: Level entry     Home Layout: One level Home Equipment: Bedside commode;Walker - standard;Walker - 2 wheels;Cane - single point;Wheelchair - manual      Prior Function Level of Independence: Needs assistance   Gait / Transfers Assistance Needed: Uses WC for transportation only. Initially reports using cane for home ambulation, but then states she has not been  out of bed and only sits up on the side of the bed in the last few weeks. Reports weakness and lack of energy "I didnt have any go."  ADL's / Homemaking Assistance Needed: Husband cooks, helps with dressing, bathing.  Comments: husband recently had pacemaker implantation and has difficulty assisting pt     Hand Dominance        Extremity/Trunk Assessment    Upper Extremity Assessment Upper Extremity Assessment: RUE deficits/detail;LUE deficits/detail;Generalized weakness RUE Deficits / Details: ROM at shoulder limited as pt only able to touch side of head vs occiput when attempting shoulder flexion. LUE Deficits / Details: ROM limited R>L; pt able to acheive level of ear with LUE.    Lower Extremity Assessment Lower Extremity Assessment: Generalized weakness;RLE deficits/detail;LLE deficits/detail RLE Deficits / Details: Strength testing deferred secondary to pain. Pt unable to demonstrate any functional ROM actively and reported pain with any passive movement by PT. Edema of ankle, skin very dry, toenails very long. Pt reports neither she nor her husband are able to care for her feet. LLE Deficits / Details: Strength testing deferred secondary to pain. Pt unable to demonstrate any functional ROM actively and reported pain with any passive movement by PT. Edema of ankle, skin very dry, toenails very long. Left ankle warmer than right to touch.    Cervical / Trunk Assessment Cervical / Trunk Assessment: Kyphotic  Communication   Communication: No difficulties  Cognition Arousal/Alertness: Awake/alert Behavior During Therapy: Anxious Overall Cognitive Status: No family/caregiver present to determine baseline cognitive functioning                                 General Comments: Pt very anxious throughout mobility and resistive during mobility attempts. Required cues for pursed lip breathing.      General Comments General comments (skin integrity, edema, etc.): 2.5L of O2 via [place]; SpO2 WFL during sesssion. BP at end of session 118/65 with Hr of 91.    Exercises     Assessment/Plan    PT Assessment Patient needs continued PT services  PT Problem List Decreased strength;Decreased range of motion;Decreased activity tolerance;Decreased balance;Decreased mobility;Decreased coordination;Decreased knowledge of use of  DME;Pain;Decreased safety awareness;Decreased cognition       PT Treatment Interventions DME instruction;Gait training;Functional mobility training;Therapeutic activities;Therapeutic exercise;Balance training;Neuromuscular re-education;Patient/family education;Wheelchair mobility training    PT Goals (Current goals can be found in the Care Plan section)  Acute Rehab PT Goals Patient Stated Goal: to improve her mobility PT Goal Formulation: With patient Time For Goal Achievement: 11/29/2020 Potential to Achieve Goals: Fair    Frequency Min 2X/week   Barriers to discharge Decreased caregiver support Pt's husband performs most ADLs and he recently underwent pacemaker implantation surgery. Unable to provide heavy physical assist    Co-evaluation               AM-PAC PT "6 Clicks" Mobility  Outcome Measure Help needed turning from your back to your side while in a flat bed without using bedrails?: Total Help needed moving from lying on your back to sitting on the side of a flat bed without using bedrails?: Total Help needed moving to and from a bed to a chair (including a wheelchair)?: Total Help needed standing up from a chair using your arms (e.g., wheelchair or bedside chair)?: Total Help needed to walk in hospital room?: Total Help needed climbing 3-5 steps with a railing? : Total 6 Click Score:  6    End of Session Equipment Utilized During Treatment: Oxygen (2.5L on [place]) Activity Tolerance: Patient limited by pain Patient left: in bed;with call bell/phone within reach (on stretcher in ed) Nurse Communication: Mobility status PT Visit Diagnosis: Muscle weakness (generalized) (M62.81);History of falling (Z91.81);Pain;Other abnormalities of gait and mobility (R26.89) Pain - Right/Left:  (bilateral) Pain - part of body: Knee;Ankle and joints of foot;Leg    Time: 3810-1751 PT Time Calculation (min) (ACUTE ONLY): 26 min   Charges:   PT Evaluation $PT Eval Moderate  Complexity: 1 Mod PT Treatments $Therapeutic Activity: 8-22 mins        Dawayne Cirri, SPT Dawayne Cirri 11/20/2020, 12:47 PM

## 2020-11-20 NOTE — ED Notes (Signed)
Albumin administration complete. Went to disconnect IV line from IV and noted that pt IV infiltrated. Pt denies pain and reports that she didn't notice. Large swollen area around IV site. Called ED pharmacist to assess and recommend treatment. IV removed. Ice pack to be applied and arm elevated.

## 2020-11-20 NOTE — Consult Note (Addendum)
[facility] Nurse Consult Note: Reason for Consult Consult requested for buttocks.  Pt is incontinent of a large amt liquid stool and urine and it is difficult to keep the affected areas from becoming soiled. Skin red, painful to tough, macerated with patchy areas of partial thickness skin loss related to moisture associated skin damage.  Wound type: Left lower buttock with Stage 3 pressure injury; 3X3X.2cm, 100% red and moist, small amt bloody drainage Left upper buttock with Stage 3 pressure injury; .8X1X.2cm, 100% red and moist Right lower buttock with Unstageable pressure injury; 2X4cm, 50% slough, 50% red and moist, small amt bloody drainage Right upper buttock with Unstageable pressure injury; 3X3cm, 100% slough, small amt tan drainage Pressure Injury POA: Yes Dressing procedure/placement/frequency: Topical treatment orders provided for bedside nurses to perform as follows to provide enzymatic debridement and promote healing:  1. Apply Santyl to right upper and lower buttock wounds Q day, then cover with moist gauze and foam dressings.  (Change foam dressings Q 3 days or PRN soiling.) 2. Foam dressing to left buttock wounds (upper and lower), change Q 3 days or PRN soiling. Please re-consult if further assistance is needed.  Thank-you,  Julien Girt MSN, [place], [place], [place], [place]

## 2020-11-20 NOTE — ED Notes (Signed)
Pt yelling "Nurse!" from room. Went to check on pt. Call light was sitting on pt abd within pt sight and reach. Educated pt on using call light when needing something. Pt reports she is "running off down there". Pt then explained that she means she is having a BM. Explained to pt to let me know when she if finished and we will clean her up. Pt verbalized understanding.

## 2020-11-21 ENCOUNTER — Encounter (HOSPITAL_COMMUNITY): Payer: Self-pay | Admitting: Internal Medicine

## 2020-11-21 ENCOUNTER — Inpatient Hospital Stay (HOSPITAL_COMMUNITY): Payer: Medicare Other

## 2020-11-21 DIAGNOSIS — K649 Unspecified hemorrhoids: Secondary | ICD-10-CM

## 2020-11-21 DIAGNOSIS — R110 Nausea: Secondary | ICD-10-CM

## 2020-11-21 DIAGNOSIS — D631 Anemia in chronic kidney disease: Secondary | ICD-10-CM

## 2020-11-21 DIAGNOSIS — K921 Melena: Secondary | ICD-10-CM

## 2020-11-21 LAB — RENAL FUNCTION PANEL
Albumin: 3.2 g/dL — ABNORMAL LOW (ref 3.5–5.0)
Anion gap: 15 (ref 5–15)
BUN: 187 mg/dL — ABNORMAL HIGH (ref 8–23)
CO2: 23 mmol/L (ref 22–32)
Calcium: 8.9 mg/dL (ref 8.9–10.3)
Chloride: 104 mmol/L (ref 98–111)
Creatinine, Ser: 5.59 mg/dL — ABNORMAL HIGH (ref 0.44–1.00)
GFR, Estimated: 7 mL/min — ABNORMAL LOW (ref >60)
Glucose, Bld: 97 mg/dL (ref 70–99)
Phosphorus: 6.3 mg/dL — ABNORMAL HIGH (ref 2.5–4.6)
Potassium: 2.9 mmol/L — ABNORMAL LOW (ref 3.5–5.1)
Sodium: 142 mmol/L (ref 135–145)

## 2020-11-21 LAB — C DIFFICILE QUICK SCREEN W PCR REFLEX
C Diff antigen: NEGATIVE
C Diff interpretation: NOT DETECTED
C Diff toxin: NEGATIVE

## 2020-11-21 LAB — CBC WITH DIFFERENTIAL/PLATELET
Abs Immature Granulocytes: 0.49 10*3/uL — ABNORMAL HIGH (ref 0.00–0.07)
Basophils Absolute: 0 10*3/uL (ref 0.0–0.1)
Basophils Relative: 0 %
Eosinophils Absolute: 0.2 10*3/uL (ref 0.0–0.5)
Eosinophils Relative: 1 %
HCT: 22 % — ABNORMAL LOW (ref 36.0–46.0)
Hemoglobin: 6.9 g/dL — CL (ref 12.0–15.0)
Immature Granulocytes: 4 %
Lymphocytes Relative: 5 %
Lymphs Abs: 0.7 10*3/uL (ref 0.7–4.0)
MCH: 29.2 pg (ref 26.0–34.0)
MCHC: 31.4 g/dL (ref 30.0–36.0)
MCV: 93.2 fL (ref 80.0–100.0)
Monocytes Absolute: 0.5 10*3/uL (ref 0.1–1.0)
Monocytes Relative: 4 %
Neutro Abs: 11.6 10*3/uL — ABNORMAL HIGH (ref 1.7–7.7)
Neutrophils Relative %: 86 %
Platelets: 290 10*3/uL (ref 150–400)
RBC: 2.36 MIL/uL — ABNORMAL LOW (ref 3.87–5.11)
RDW: 15.9 % — ABNORMAL HIGH (ref 11.5–15.5)
WBC: 13.5 10*3/uL — ABNORMAL HIGH (ref 4.0–10.5)
nRBC: 0 % (ref 0.0–0.2)

## 2020-11-21 LAB — PREPARE RBC (CROSSMATCH)

## 2020-11-21 MED ORDER — POTASSIUM CHLORIDE 20 MEQ PO PACK
20.0000 meq | PACK | Freq: Two times a day (BID) | ORAL | Status: AC
  Administered 2020-11-21 (×2): 20 meq via ORAL
  Filled 2020-11-21 (×2): qty 1

## 2020-11-21 MED ORDER — POTASSIUM CHLORIDE 20 MEQ PO PACK
20.0000 meq | PACK | Freq: Once | ORAL | Status: AC
  Administered 2020-11-21: 20 meq via ORAL
  Filled 2020-11-21: qty 1

## 2020-11-21 MED ORDER — PANTOPRAZOLE SODIUM 40 MG PO TBEC
40.0000 mg | DELAYED_RELEASE_TABLET | Freq: Every day | ORAL | Status: DC
  Administered 2020-11-22 – 2020-11-30 (×9): 40 mg via ORAL
  Filled 2020-11-21 (×9): qty 1

## 2020-11-21 MED ORDER — ACETAMINOPHEN 325 MG PO TABS: 650.0000 mg | ORAL_TABLET | Freq: Four times a day (QID) | ORAL | Status: DC | PRN

## 2020-11-21 MED ORDER — POTASSIUM CHLORIDE CRYS ER 20 MEQ PO TBCR: 20.0000 meq | EXTENDED_RELEASE_TABLET | ORAL | Status: DC

## 2020-11-21 MED ORDER — LIDOCAINE 5 % EX PTCH
2.0000 | MEDICATED_PATCH | CUTANEOUS | Status: AC
  Administered 2020-11-21 – 2020-11-22 (×2): 2 via TRANSDERMAL
  Filled 2020-11-21 (×2): qty 2

## 2020-11-21 MED ORDER — SODIUM CHLORIDE 0.9% IV SOLUTION: Freq: Once | INTRAVENOUS | Status: DC

## 2020-11-21 MED ORDER — FENTANYL CITRATE (PF) 100 MCG/2ML IJ SOLN
12.5000 ug | INTRAMUSCULAR | Status: DC | PRN
  Administered 2020-11-21 – 2020-11-26 (×3): 12.5 ug via INTRAVENOUS
  Filled 2020-11-21 (×4): qty 2

## 2020-11-21 MED ORDER — POTASSIUM CHLORIDE CRYS ER 20 MEQ PO TBCR
40.0000 meq | EXTENDED_RELEASE_TABLET | Freq: Once | ORAL | Status: AC
  Administered 2020-11-21: 40 meq via ORAL
  Filled 2020-11-21: qty 2

## 2020-11-21 NOTE — Consult Note (Signed)
[facility] Consult: 11:23 AM 11/21/2020  LOS: 2 days    Referring Provider: Dr Alfredia Ferguson  Primary Care Physician:  Hoyt Koch, MD Primary Gastroenterologist:  Dr. Lucio Edward.    Reason for Consultation: Altered bowel habits.  Loose stool.  FOBT positive.  Normocytic anemia.   HPI: [name] is a 75 y.o. female.  PMH htn.  Hld.  05/2017 ascending aortic aneurysm repair w bio prosthetic AVR.  Diastolic CHF.  Recurrent pleural effusions.  Pericardial effusion.  Obesity.  NIDDM.  Pulmonary hypertension.  Chronic home oxygen of 2 to 3 L.  Stage 4 CKD, followed by nephrology every 6 months.  Chronic anemia of chronic disease.  Previous treatments with Feraheme 2017, 2018, 2019.  Gout.  Chronic neck pain, cervical radiculopathy.  COVID-19 05/2019.  Wheelchair-bound x 2 years due to severe bilateral knee OA.  However she is able to ambulate a little bit with her cane.  02/2012 colonoscopy, For follow-up of history adenomatous polyps 2003, 2008.  12 mm pedunculated polyp at ascending colon, 4 mm sessile polyp in sigmoid, both removed.  Sigmoid diverticulosis.  Nonbleeding external hemorrhoids.  Pathology showed TA's no HGD.   Has not had subsequent follow-up colonoscopy.  No records of previous upper endoscopy.  At previous baseline she was able to transfer herself from bed to wheelchair, to commode.  However within the last week she has developed difficulty performing these tasks and started wearing diapers which caused a rash. On Friday, 5 days ago, bedside commode collapsed and she fell to the floor, laid on the floor overnight.  Decreased p.o. intake since them.  Diarrhea but no nausea or vomiting. Splint is no longer able to assist her in transfers.  Patient describes intermittent nausea but not vomiting.   This is been going on for at least several weeks if not months.  She has a nausea medication that she takes as needed.  In the last couple of weeks her appetite has diminished and nausea has increased.  She tolerates liquids.  Also within the past week or so she is had change of bowel habits from solid, formed stools to loose, nonbloody stools about once a day.  Rare stool incontinence.  No bloody stool but has intermittent history of minor bleeding per rectum from her hemorrhoids.  Vague abdominal pain that does not seem to be triggered by PO intake.  Feels she is probably lost weight.  Presented to the ED yesterday evening for eval neck and lower extremity pain. Hgb 7.3 >> 1 PRBC >> 7.7.  Was 10.3 in early March, 8.4 in late May.  MCV 94.  Platelets 287.  WBC 16.3 >> 14.3. AKI with GFR 7, BUN/creatinine 18 5/6.2.  Potassium 2.9 LFTs are normal. CK total 147.  No BNP or troponins acid. INR not assayed. Stool C. difficile antigen and toxin are negative.  FOBT positive.  Stool PCR pathogen panel is pending. Portable CXR: Chronic, mild pulmonary vascular congestion. Renal ultrasound: No obstruction, no hydronephrosis.  Chronic medical renal disease.  Bilateral renal cysts  Raquel Sarna history  of colon polyps in her mother and sister.    Past Medical History:  Diagnosis Date   Adenomatous colon polyp 01/2002   Anemia, chronic disease    /notes 02/04/2017   Aortic valve disease    AI/AS   Arthritis    "lower back, knees" (02/04/2017)   CHF (congestive heart failure) (HCC)    CKD (chronic kidney disease), stage III (HCC)    Diverticulosis of colon    DJD (degenerative joint disease)    Frequent UTI    Hemorrhoids    Hypertension    Lumbar back pain    Neuropathy    Obesity    Pericardial effusion     in a patient with Diastolic heart  failure and Pericardial effusion  known since last 2 D echo in 11/2016 /notes 02/04/2017   PONV (postoperative nausea and vomiting)    Pulmonary HTN (HCC)     Renal cyst    Venous insufficiency    Vitamin D deficiency     Past Surgical History:  Procedure Laterality Date   ABDOMINAL HYSTERECTOMY     "partial"   AORTIC VALVE REPLACEMENT N/A 05/30/2017   Procedure: AORTIC VALVE REPLACEMENT (AVR);  Surgeon: Prescott Gum, Collier Salina, MD;  Location: [facility] OR;  Service: Vascular;  Laterality: N/A;   COLONOSCOPY W/ BIOPSIES AND POLYPECTOMY     "bx was ok"   IR THORACENTESIS ASP PLEURAL SPACE W/IMG GUIDE  06/11/2017   IR THORACENTESIS ASP PLEURAL SPACE W/IMG GUIDE  06/12/2017   REPAIR OF ACUTE ASCENDING THORACIC AORTIC DISSECTION N/A 05/30/2017   Procedure: REPLACEMENT OF ASCENDING AORTIC ANEURYSM AND REPAIRED CHRONIC ROOT DISSECTION WITH CIRC ARREST;  Surgeon: Ivin Poot, MD;  Location: [place];  Service: Vascular;  Laterality: N/A;   TEE WITHOUT CARDIOVERSION N/A 05/28/2017   Procedure: TRANSESOPHAGEAL ECHOCARDIOGRAM (TEE);  Surgeon: Skeet Latch, MD;  Location: [place];  Service: Cardiovascular;  Laterality: N/A;   TEE WITHOUT CARDIOVERSION N/A 05/30/2017   Procedure: TRANSESOPHAGEAL ECHOCARDIOGRAM (TEE);  Surgeon: Prescott Gum, Collier Salina, MD;  Location: [place];  Service: Open Heart Surgery;  Laterality: N/A;    Prior to Admission medications   Medication Sig Start Date End Date Taking? Authorizing Provider  amLODipine (NORVASC) 5 MG tablet Take 5 mg by mouth at bedtime. 05/07/18  Yes [provider]  aspirin EC 81 MG tablet Take 81 mg by mouth daily.   Yes [provider]  calcitRIOL (ROCALTROL) 0.25 MCG capsule Take 0.25 mcg by mouth See admin instructions. Take one capsule (0.25 mcg) by mouth twice weekly - Monday and Friday   Yes [provider]  CALCIUM CARBONATE ANTACID PO Take 2 tablets by mouth daily as needed for indigestion or heartburn.    Yes [provider]  carvedilol (COREG) 25 MG tablet Take 1 tablet (25 mg total) by mouth 2 (two) times daily with a meal. 07/16/18  Yes Aline August, MD  Colchicine 0.6 MG CAPS  Take 0.6 mg by mouth as needed. Take every other day as needed during gout flare; not to exceed 3 doses per flare 03/22/20  Yes [provider]  cyanocobalamin 1000 MCG tablet Take 1,000 mcg by mouth daily.   Yes [provider]  fluticasone (FLONASE) 50 MCG/ACT nasal spray Place 2 sprays into both nostrils daily as needed for allergies. 07/16/18  Yes Aline August, MD  furosemide (LASIX) 80 MG tablet Take 80 mg by mouth 2 (two) times daily.   Yes [provider]  gabapentin (NEURONTIN) 100 MG capsule  Take 1 capsule (100 mg total) by mouth at bedtime. 02/04/20  Yes Lyndal Pulley, DO  Multiple Vitamin (MULTIVITAMIN) tablet Take 1 tablet by mouth daily.   Yes [provider]  ondansetron (ZOFRAN) 4 MG tablet Take 1 tablet (4 mg total) by mouth every 8 (eight) hours as needed for nausea or vomiting. 07/16/18  Yes Aline August, MD  OXYGEN Inhale 2 L into the lungs as needed (shortness of breath).   Yes [provider]  potassium chloride SA (K-DUR,KLOR-CON) 20 MEQ tablet Take 20 mEq by mouth daily after lunch.    Yes [provider]  Propylene Glycol 0.6 % SOLN Place 1 drop into both eyes daily as needed (for dry eyes).   Yes [provider]  TART CHERRY PO Take 1 tablet by mouth daily.   Yes [provider]  hydrocortisone (ANUSOL-HC) 2.5 % rectal cream Place 1 application rectally as needed. Patient not taking: Reported on 11/19/2020 05/24/13   Noralee Space, MD  triamcinolone ointment (KENALOG) 0.5 % Apply 1 application topically daily as needed (itching). Patient not taking: Reported on 11/19/2020 06/16/17   Nani Skillern, PA-C    Scheduled Meds:  amLODipine  5 mg Oral QHS   aspirin EC  81 mg Oral Daily   calcitRIOL  0.25 mcg Oral Once per day on Mon Fri   capsicum   Topical BID   carvedilol  25 mg Oral BID WC   collagenase   Topical Daily   [START ON 11/28/2020] darbepoetin (ARANESP) injection - NON-DIALYSIS  100  mcg Subcutaneous Q Tue-1800   darbepoetin (ARANESP) injection - DIALYSIS  60 mcg Intravenous Once   DULoxetine  30 mg Oral Daily   gabapentin  100 mg Oral QHS   heparin injection (subcutaneous)  5,000 Units Subcutaneous Q12H   lidocaine  2 patch Transdermal Q24H   multivitamin with minerals  1 tablet Oral Daily   potassium chloride  20 mEq Oral BID   Infusions:  albumin human 25 g (11/21/20 0514)   ferric gluconate (FERRLECIT) IVPB 125 mg (11/21/20 1118)   lactated ringers 100 mL/hr at 11/21/20 1610   PRN Meds: acetaminophen, calcium carbonate, fluticasone, ondansetron, polyvinyl alcohol   Allergies as of 11/19/2020 - Review Complete 11/19/2020  Allergen Reaction Noted   Methylprednisolone Other (See Comments) 06/05/2019   Minoxidil Palpitations and Other (See Comments) 01/20/2012   Naproxen Swelling 12/23/2006    Family History  Problem Relation Age of Onset   Diabetes Mother    Colon polyps Mother    Hypertension Mother    Hypertension Brother    Hypertension Sister    Colon cancer Neg Hx     Social History   Socioeconomic History   Marital status: Married    Spouse name: Juanda Crumble   Number of children: 1   Years of education: 12   Highest education level: Not on file  Occupational History   Occupation: retired from Administrator, [employer]  Tobacco Use   Smoking status: Former    Packs/day: 0.40    Years: 15.00    Pack years: 6.00    Types: Cigarettes    Quit date: 04/08/1982    Years since quitting: 38.6   Smokeless tobacco: Never  Vaping Use   Vaping Use: Never used  Substance and Sexual Activity   Alcohol use: No   Drug use: No   Sexual activity: Never  Other Topics Concern   Not on file  Social History Narrative   Lives at  home w/ her husband   Right-handed   Daily caffeine    Social Determinants of Health   Financial Resource Strain: Low Risk    Difficulty of Paying Living Expenses: Not hard at all  Food Insecurity: No Food Insecurity   Worried About  Charity fundraiser in the Last Year: Never true   Arboriculturist in the Last Year: Never true  Transportation Needs: No Transportation Needs   Lack of Transportation (Medical): No   Lack of Transportation (Non-Medical): No  Physical Activity: Inactive   Days of Exercise per Week: 0 days   Minutes of Exercise per Session: 0 min  Stress: No Stress Concern Present   Feeling of Stress : Not at all  Social Connections: Not on file  Intimate Partner Violence: Not on file    REVIEW OF SYSTEMS: Constitutional: Fatigue, weakness and recent weeks. ENT:  No nose bleeds Pulm: No new shortness of breath or cough. CV:  No palpitations, no angina.  Swelling in her lower legs is chronic. GU:  No hematuria, no frequency GI: See HPI.  Denies dysphagia. Heme: No recall of.  VS blood transfusions Transfusions:  Neuro:  No headaches, no peripheral tingling or numbness Derm:  No itching, no rash or sores.  Endocrine:  No sweats or chills.  No polyuria or dysuria Immunization: Received PRBCs, FFP in 06/2017. Travel:  None    PHYSICAL EXAM: Vital signs in last 24 hours: Vitals:   11/21/20 0315 11/21/20 0833  BP: 114/69 (!) 116/55  Pulse: 77 79  Resp: 20 20  Temp: 98 F (36.7 C) 98.9 F (37.2 C)  SpO2: 97%    Wt Readings from Last 3 Encounters:  11/20/20 89.2 kg  03/08/20 78.5 kg  07/29/19 78 kg    General: Obese, chronically unwell, alert.  Uncomfortable due to neck and knee pain. Head: No facial asymmetry or signs of head trauma. Eyes: No scleral icterus.  No conjunctival pallor. Ears: No obvious hearing deficit. Nose: No discharge or congestion Mouth: No blood at mouth.  Mucosa pink, moist, clear. Neck: No JVD. Lungs: No labored breathing.  No cough.  Lungs clear to auscultation in front. Heart: RRR.  Murmur present. Abdomen: Obese.  Not tender.  Not distended.  Bowel sounds hypoactive but normal quality..   Rectal: Large, nonthrombosed external hemorrhoids.  Tiny area of  temporary oozing of blood on the right sided hemorrhoid.  This was present initially but resolved spontaneously and did not recur.  Slight ulceration associated with this area.  Stool itself is soft, medium brown and FOBT negative.  No internal rectal masses palpated.  No impaction, hard stool. Musc/Skeltl: Some swelling in the knees bilaterally. Extremities: Swelling, stasis dermatitis in the feet, ankles. Neurologic: Alert.  Appropriate.  Oriented x3.  Moves all 4 limbs with difficulty, no tremors.  Strength not tested. Skin: Dermatitis in the feet bilaterally.  Onychomycosis and very long toenails. Nodes: No cervical adenopathy. Psych: Pleasant, cooperative, fluid speech.  Intake/Output from previous day: 08/15 0701 - 08/16 0700 In: 1107.1 [P.O.:360; I.V.:397.7; IV Piggyback:349.4] Out: -  Intake/Output this shift: No intake/output data recorded.  LAB RESULTS: Recent Labs    11/19/20 1356 11/20/20 0645  WBC 16.3* 14.3*  HGB 7.3* 7.7*  HCT 22.9* 25.1*  PLT 330 287   BMET Lab Results  Component Value Date   NA 142 11/21/2020   NA 142 11/20/2020   NA 140 11/19/2020   K 2.9 (L) 11/21/2020   K 3.4 (L)  11/20/2020   K 3.1 (L) 11/19/2020   CL 104 11/21/2020   CL 103 11/20/2020   CL 102 11/19/2020   CO2 23 11/21/2020   CO2 22 11/20/2020   CO2 22 11/19/2020   GLUCOSE 97 11/21/2020   GLUCOSE 105 (H) 11/20/2020   GLUCOSE 119 (H) 11/19/2020   BUN 187 (H) 11/21/2020   BUN 189 (H) 11/20/2020   BUN 185 (H) 11/19/2020   CREATININE 5.59 (H) 11/21/2020   CREATININE 5.80 (H) 11/20/2020   CREATININE 6.20 (H) 11/19/2020   CALCIUM 8.9 11/21/2020   CALCIUM 8.8 (L) 11/20/2020   CALCIUM 8.4 (L) 11/19/2020   LFT Recent Labs    11/19/20 1356 11/21/20 0342  PROT 6.2*  --   ALBUMIN 2.0* 3.2*  AST 40  --   ALT 37  --   ALKPHOS 78  --   BILITOT 0.6  --    PT/INR Lab Results  Component Value Date   INR 1.0 06/05/2019   INR 1.21 06/26/2017   INR 0.83 05/30/2017   Hepatitis  Panel No results for input(s): HEPBSAG, HCVAB, HEPAIGM, HEPBIGM in the last 72 hours. C-Diff No components found for: CDIFF Lipase     Component Value Date/Time   LIPASE 110 (H) 02/04/2017 1308    Drugs of Abuse  No results found for: LABOPIA, COCAINSCRNUR, LABBENZ, AMPHETMU, THCU, LABBARB   RADIOLOGY STUDIES: DG Knee 1-2 Views Left  Result Date: 11/19/2020 CLINICAL DATA:  Pain. EXAM: LEFT KNEE - 1-2 VIEW COMPARISON:  10/22/2017. FINDINGS: Advanced tricompartmental degenerative osteoarthritic changes of the LEFT knee, with significant joint space loss and degenerative osteophyte formation. These degenerative changes are not significantly changed compared to the previous plain film of 10/22/2017. No fracture line or displaced fracture fragment. No acute-appearing cortical irregularity or osseous lesion. Probable joint effusion within the suprapatellar bursa. Superficial soft tissues about the LEFT knee are unremarkable. IMPRESSION: 1. Advanced degenerative osteoarthritic changes of the LEFT knee, not significantly changed compared to the previous plain film of 10/22/2017. 2. No acute findings. Electronically Signed   By: Franki Cabot M.D.   On: 11/19/2020 18:14   DG Knee 1-2 Views Right  Result Date: 11/19/2020 CLINICAL DATA:  Pain. EXAM: RIGHT KNEE - 1-2 VIEW COMPARISON:  Plain film of the RIGHT knee dated 10/22/2017. FINDINGS: Advanced degenerative osteoarthritic changes of the RIGHT knee, tricompartmental but most severe at the medial compartment, with associated joint space narrowing and osteophyte formation. No fracture line or displaced fracture fragment. No acute-appearing cortical irregularity or osseous lesion. Probable small joint effusion within the suprapatellar bursa, likely chronic. Superficial soft tissues about the RIGHT knee are unremarkable. IMPRESSION: 1. Advanced degenerative osteoarthritic changes of the RIGHT knee, tricompartmental but most severe at the medial compartment,  not significantly changed compared to previous plain film of 10/22/2017. 2. Probable small joint effusion, likely chronic. 3. No acute findings. Electronically Signed   By: Franki Cabot M.D.   On: 11/19/2020 18:16   US RENAL  Result Date: 11/19/2020 CLINICAL DATA:  Acute kidney injury. EXAM: RENAL / URINARY TRACT ULTRASOUND COMPLETE COMPARISON:  Noncontrast CT 05/28/2017 FINDINGS: Right Kidney: Renal measurements: 10.5 x 4.3 x 3.9 cm = volume: 92 mL. No hydronephrosis. Increased renal parenchymal echogenicity. 1 cm cyst in the anterior superior kidney. No visualized stone or solid lesion. Left Kidney: Renal measurements: 8.1 x 3.9 x 4.9 cm = volume: 81 mL. No hydronephrosis. Diffusely increased renal parenchymal echogenicity. Exophytic cyst arising from the lower kidney measures 5.9 x 4.9 x 5.4  cm. No visualized stone or solid lesion. Bladder: Appears normal for degree of bladder distention. Both ureteral jets are seen. Other: None. IMPRESSION: 1. No hydronephrosis or obstructive uropathy. 2. Increased renal parenchymal echogenicity typical of chronic medical renal disease. 3. Bilateral renal cysts. Electronically Signed   By: Keith Rake M.D.   On: 11/19/2020 20:00   DG Chest Port 1 View  Result Date: 11/20/2020 CLINICAL DATA:  Shortness of breath. EXAM: PORTABLE CHEST 1 VIEW COMPARISON:  Chest x-ray dated March 08, 2020. FINDINGS: Stable cardiomegaly status post AVR. Chronic mild pulmonary vascular congestion. No focal consolidation, pleural effusion, or pneumothorax. No acute osseous abnormality. IMPRESSION: 1. Chronic mild pulmonary vascular congestion. Electronically Signed   By: Titus Dubin M.D.   On: 11/20/2020 07:31     IMPRESSION:   Mange of bowel habits with loose stool once daily.  C. difficile negative.  Stool toxin PCR panel pending.  FOBT positive on specimen yesterday, FOBT negative on my DRE today.  Minor, brief oozing blood seen from large hemorrhoid during DRE  today.  Progressive nausea without vomiting.  Suspect this may be due to AKI, azotemia.    Adenomatous colon polyps 2003, 2008, 2013.  No colonoscopy since 2013.  Additional colonoscopic findings include diverticulosis and hemorrhoids.     Acute on chronic anemia.  History of IDA, previous Feraheme, and anemia of chronic disease.  Dr. Posey Pronto, nephrology, ordered doses of Nulecit and is starting ESA.  Hgb 7.3 >> seven-point 1:07 PRBC.      AKI on top of CKD stage 4.  Parameters improved overnight with hydration.  Degenerative spine disease especially in the cervical spine with radiculopathy.  Osteoarthritis in the knees leading to wheelchair dependency x2 years.  Progressive physical decline with difficulty transferring out of wheelchair to bed, toilet, bedside commode.  Husband has become unable to assist her.  Chronic hypoxic respiratory failure.  On chronic home oxygen.  Bioprosthetic AVR, repair ascending aortic aneurysm in 2019.  Takes 81 mg aspirin daily.    PLAN:        Observation for improvement in her nausea, abdominal pain as her renal function improves.  Consider EGD if the symptoms fail to improve.  Ordered Protonix 40 mg daily.  Not on PPI or H2 blocker PTA.  Ordered portable KUB to assess bowel gas pattern and stool distribution.     Await results of stool pathogen PCR testing     Diet as tolerated.   Azucena Freed  11/21/2020, 11:23 AM Phone [phone]

## 2020-11-21 NOTE — Progress Notes (Signed)
Patient ID: [name], female   DOB: October 24, 1945, 75 y.o.   MRN: [id number] [facility] Progress Note   Assessment/ Plan:   1.  Acute kidney injury on chronic kidney disease stage IV: Uncertain whether this is entirely acute kidney injury or progression of underlying chronic kidney disease (last had labs/saw nephrology 8 months ago).  Based on the history and timeline of events, suspected to have hemodynamically mediated renal injury-CPK level not suggestive of rhabdomyolysis given preceding history of fall/laying on the floor for prolonged time.  Renal ultrasound consistent with chronic kidney disease but without any obstruction.  Overnight, urine output not charted but labs show possible improvement of BUN/creatinine.  I will continue her intravenous fluids anticipating additional losses from diarrhea. 2.  Hypokalemia: This appears to be exacerbated by her diarrhea-replace via oral route 3.  Chronic hypoxic respiratory failure: She appears to be at baseline with regards to her respiratory status on oxygen supplementation at 2 L/min via nasal cannula. 4.  Anemia of chronic kidney disease/chronic illness: With iron deficiency and associated anemia, on intravenous iron supplementation. 5.  Chronic cervical spine radiculopathy with chronic back pain with acute on chronic ambulation dysfunction: Suspect that with her limitations and current social support, she will unlikely be able to return home and may need placement at a skilled nursing facility.  Subjective:   Reports reduction of diarrhea overnight and feels fatigued this morning.   Objective:   BP (!) 116/55   Pulse 79   Temp 98.9 F (37.2 C) (Oral)   Resp 20   Ht 5\' 6"  (1.676 m)   Wt 89.2 kg   SpO2 97%   BMI 31.74 kg/m   Intake/Output Summary (Last 24 hours) at 11/21/2020 1015 Last data filed at 11/21/2020 0600 Gross per 24 hour  Intake 1057.13 ml  Output --  Net 1057.13 ml   Weight change: 12.1 kg  Physical  Exam: Gen: Appears comfortable resting in bed, awakens to conversation CVS: Pulse regular rhythm, normal rate, S1 and S2 normal Resp: Anteriorly clear to auscultation, no rales/rhonchi Abd: Soft, moderately distended/obese, some discomfort/tenderness over lower quadrants Ext: Trace lower extremity edema with dry skin and long/unkempt toenails  Imaging: DG Knee 1-2 Views Left  Result Date: 11/19/2020 CLINICAL DATA:  Pain. EXAM: LEFT KNEE - 1-2 VIEW COMPARISON:  10/22/2017. FINDINGS: Advanced tricompartmental degenerative osteoarthritic changes of the LEFT knee, with significant joint space loss and degenerative osteophyte formation. These degenerative changes are not significantly changed compared to the previous plain film of 10/22/2017. No fracture line or displaced fracture fragment. No acute-appearing cortical irregularity or osseous lesion. Probable joint effusion within the suprapatellar bursa. Superficial soft tissues about the LEFT knee are unremarkable. IMPRESSION: 1. Advanced degenerative osteoarthritic changes of the LEFT knee, not significantly changed compared to the previous plain film of 10/22/2017. 2. No acute findings. Electronically Signed   By: Franki Cabot M.D.   On: 11/19/2020 18:14   DG Knee 1-2 Views Right  Result Date: 11/19/2020 CLINICAL DATA:  Pain. EXAM: RIGHT KNEE - 1-2 VIEW COMPARISON:  Plain film of the RIGHT knee dated 10/22/2017. FINDINGS: Advanced degenerative osteoarthritic changes of the RIGHT knee, tricompartmental but most severe at the medial compartment, with associated joint space narrowing and osteophyte formation. No fracture line or displaced fracture fragment. No acute-appearing cortical irregularity or osseous lesion. Probable small joint effusion within the suprapatellar bursa, likely chronic. Superficial soft tissues about the RIGHT knee are unremarkable. IMPRESSION: 1. Advanced degenerative osteoarthritic changes of the RIGHT  knee, tricompartmental but  most severe at the medial compartment, not significantly changed compared to previous plain film of 10/22/2017. 2. Probable small joint effusion, likely chronic. 3. No acute findings. Electronically Signed   By: Franki Cabot M.D.   On: 11/19/2020 18:16   US RENAL  Result Date: 11/19/2020 CLINICAL DATA:  Acute kidney injury. EXAM: RENAL / URINARY TRACT ULTRASOUND COMPLETE COMPARISON:  Noncontrast CT 05/28/2017 FINDINGS: Right Kidney: Renal measurements: 10.5 x 4.3 x 3.9 cm = volume: 92 mL. No hydronephrosis. Increased renal parenchymal echogenicity. 1 cm cyst in the anterior superior kidney. No visualized stone or solid lesion. Left Kidney: Renal measurements: 8.1 x 3.9 x 4.9 cm = volume: 81 mL. No hydronephrosis. Diffusely increased renal parenchymal echogenicity. Exophytic cyst arising from the lower kidney measures 5.9 x 4.9 x 5.4 cm. No visualized stone or solid lesion. Bladder: Appears normal for degree of bladder distention. Both ureteral jets are seen. Other: None. IMPRESSION: 1. No hydronephrosis or obstructive uropathy. 2. Increased renal parenchymal echogenicity typical of chronic medical renal disease. 3. Bilateral renal cysts. Electronically Signed   By: Keith Rake M.D.   On: 11/19/2020 20:00   DG Chest Port 1 View  Result Date: 11/20/2020 CLINICAL DATA:  Shortness of breath. EXAM: PORTABLE CHEST 1 VIEW COMPARISON:  Chest x-ray dated March 08, 2020. FINDINGS: Stable cardiomegaly status post AVR. Chronic mild pulmonary vascular congestion. No focal consolidation, pleural effusion, or pneumothorax. No acute osseous abnormality. IMPRESSION: 1. Chronic mild pulmonary vascular congestion. Electronically Signed   By: Titus Dubin M.D.   On: 11/20/2020 07:31    Labs: BMET Recent Labs  Lab 11/19/20 1356 11/20/20 0645 11/21/20 0342  NA 140 142 142  K 3.1* 3.4* 2.9*  CL 102 103 104  CO2 22 22 23   GLUCOSE 119* 105* 97  BUN 185* 189* 187*  CREATININE 6.20* 5.80* 5.59*  CALCIUM 8.4*  8.8* 8.9  PHOS  --   --  6.3*   CBC Recent Labs  Lab 11/19/20 1356 11/20/20 0645  WBC 16.3* 14.3*  NEUTROABS 14.2*  --   HGB 7.3* 7.7*  HCT 22.9* 25.1*  MCV 92.7 94.0  PLT 330 287    Medications:     amLODipine  5 mg Oral QHS   aspirin EC  81 mg Oral Daily   calcitRIOL  0.25 mcg Oral Once per day on Mon Fri   capsicum   Topical BID   carvedilol  25 mg Oral BID WC   collagenase   Topical Daily   [START ON 11/28/2020] darbepoetin (ARANESP) injection - NON-DIALYSIS  100 mcg Subcutaneous Q Tue-1800   darbepoetin (ARANESP) injection - DIALYSIS  60 mcg Intravenous Once   DULoxetine  30 mg Oral Daily   gabapentin  100 mg Oral QHS   heparin injection (subcutaneous)  5,000 Units Subcutaneous Q12H   lidocaine  2 patch Transdermal Q24H   multivitamin with minerals  1 tablet Oral Daily   potassium chloride  20 mEq Oral BID   Elmarie Shiley, MD 11/21/2020, 10:15 AM

## 2020-11-21 NOTE — NC FL2 (Signed)
[place] LEVEL OF CARE SCREENING TOOL     IDENTIFICATION  Patient Name: [name] Birthdate: 23-Jan-1946 Sex: female Admission Date (Current Location): 11/19/2020  [facility] and [place] Number:  Herbalist and Address:  [facility], [place] [address] [place], [place] [address]      Provider Number: [id number]  Attending Physician Name and Address:  Kerney Elbe, DO  Relative Name and Phone Number:       Current Level of Care: Hospital Recommended Level of Care: [place] Prior Approval Number:    Date Approved/Denied:   PASRR Number: [id number]  Discharge Plan: SNF    Current Diagnoses: Patient Active Problem List   Diagnosis Date Noted   Pressure injury of skin 11/20/2020   Pain    Nonrheumatic mitral valve regurgitation 03/16/2020   Acute respiratory disease due to COVID-19 virus 06/05/2019   Degenerative arthritis of knee, bilateral 04/06/2019   Encounter for general adult medical examination with abnormal findings 02/26/2019   Degenerative disc disease, cervical 12/12/2018   Chronic kidney disease 08/10/2018   Leg swelling 11/24/2017   Bilateral leg weakness 06/26/2017   History of aortic valve replacement with bioprosthetic valve 05/30/2017   Ascending aortic dissection (HCC)    Chronic diastolic CHF (congestive heart failure) ([place]) 04/19/2017   Nonrheumatic aortic valve insufficiency 03/10/2017   Pulmonary HTN ([place]) 03/10/2017   Neck pain 02/11/2017   AKI (acute kidney injury) ([place])    Pericardial effusion 02/04/2017   Abnormal bone marrow examination 01/24/2017   Stenosis of right carotid artery 08/27/2016   LBBB (left bundle branch block) 08/27/2016   Left-sided tinnitus 08/16/2016   Anemia in chronic kidney disease 11/02/2015   OA (osteoarthritis) of knee 03/23/2012   Vitamin D deficiency 04/13/2008   Symptomatic anemia 04/13/2008   OBESITY 02/23/2008   Radiculopathy 02/23/2008    Essential hypertension 02/23/2008   Venous (peripheral) insufficiency 02/23/2008    Orientation RESPIRATION BLADDER Height & Weight     Self, Time, Situation, Place  O2 (Nasal cannula 2L) External catheter, Continent Weight: 196 lb 10.4 oz (89.2 kg) Height:  5\' 6"  (167.6 cm)  BEHAVIORAL SYMPTOMS/MOOD NEUROLOGICAL BOWEL NUTRITION STATUS      Continent Diet (See DC Summary)  AMBULATORY STATUS COMMUNICATION OF NEEDS Skin   Extensive Assist Verbally PU Stage and Appropriate Care (Stage III and unstageable on buttocks)                       Personal Care Assistance Level of Assistance  Bathing, Feeding, Dressing Bathing Assistance: Limited assistance Feeding assistance: Limited assistance Dressing Assistance: Limited assistance     Functional Limitations Info             [place]  PT (By licensed PT), OT (By licensed OT)     PT Frequency: 5x/week OT Frequency: 5x/week            Contractures Contractures Info: Not present    Additional Factors Info  Code Status, Allergies, Isolation Precautions Code Status Info: Full Allergies Info: Methylprednisolone, Minoxidil, Naproxen     Isolation Precautions Info: Enteric precautions     Current Medications (11/21/2020):  This is the current hospital active medication list Current Facility-Administered Medications  Medication Dose Route Frequency Provider Last Rate Last Admin   acetaminophen (TYLENOL) tablet 650 mg  650 mg Oral Q6H PRN Hall, Carole N, DO       amLODipine (NORVASC) tablet 5 mg  5 mg Oral QHS Wynetta Fines T, MD   5 mg at 11/20/20 2238   aspirin EC tablet 81 mg  81 mg Oral Daily Wynetta Fines T, MD   81 mg at 11/21/20 1114   calcitRIOL (ROCALTROL) capsule 0.25 mcg  0.25 mcg Oral Once per day on Mon Fri Zhang, Ping T, MD   0.25 mcg at 11/20/20 1226   calcium carbonate (TUMS - dosed in mg elemental calcium) chewable tablet 200 mg of elemental calcium  1 tablet Oral Daily PRN Lequita Halt,  MD       capsicum (ZOSTRIX) 0.075 % cream   Topical BID Lequita Halt, MD   Given at 11/21/20 1127   carvedilol (COREG) tablet 25 mg  25 mg Oral BID WC Wynetta Fines T, MD   25 mg at 11/21/20 0848   collagenase (SANTYL) ointment   Topical Daily Raiford Noble Theodore, [place]   Given at 11/21/20 1128   [START ON 11/28/2020] Darbepoetin Alfa (ARANESP) injection 100 mcg  100 mcg Subcutaneous Q Tue-1800 Elmarie Shiley, MD       Darbepoetin Alfa (ARANESP) injection 60 mcg  60 mcg Intravenous Once Sheikh, Omair Latif, DO       DULoxetine (CYMBALTA) DR capsule 30 mg  30 mg Oral Daily Wynetta Fines T, MD   30 mg at 11/21/20 1115   fentaNYL (SUBLIMAZE) injection 12.5 mcg  12.5 mcg Intravenous Q2H PRN Raiford Noble Latif, DO   12.5 mcg at 11/21/20 1443   ferric gluconate (FERRLECIT) 125 mg in sodium chloride 0.9 % 100 mL IVPB  125 mg Intravenous Daily Elmarie Shiley, MD   Stopped at 11/21/20 1256   fluticasone (FLONASE) 50 MCG/ACT nasal spray 2 spray  2 spray Each Nare Daily PRN Wynetta Fines T, MD       gabapentin (NEURONTIN) capsule 100 mg  100 mg Oral QHS Wynetta Fines T, MD   100 mg at 11/20/20 2146   heparin injection 5,000 Units  5,000 Units Subcutaneous Q12H Lequita Halt, MD   5,000 Units at 11/21/20 1120   lactated ringers infusion   Intravenous Continuous Elmarie Shiley, MD 100 mL/hr at 11/21/20 1422 New Bag at 11/21/20 1422   lidocaine (LIDODERM) 5 % 2 patch  2 patch Transdermal Q24H Irene Pap N, DO   2 patch at 11/21/20 0555   multivitamin with minerals tablet 1 tablet  1 tablet Oral Daily Heloise Purpura, RPH       ondansetron [facility]) tablet 4 mg  4 mg Oral Q8H PRN Lequita Halt, MD       [START ON 11/22/2020] pantoprazole (PROTONIX) EC tablet 40 mg  40 mg Oral Q0600 Vena Rua, PA-C       polyvinyl alcohol (LIQUIFILM TEARS) 1.4 % ophthalmic solution 1 drop  1 drop Both Eyes PRN Heloise Purpura, RPH       potassium chloride (KLOR-CON) packet 20 mEq  20 mEq Oral BID Elmarie Shiley, MD   20 mEq at 11/21/20 1420      Discharge Medications: Please see discharge summary for a list of discharge medications.  Relevant Imaging Results:  Relevant Lab Results:   Additional Information SSN: [id number]. Moderna COVID-19 Vaccine 09/23/2019 , 08/26/2019  Moderna Covid-19 Booster Vaccine 03/29/2020  Lissa Morales Lennex Pietila, LCSW

## 2020-11-21 NOTE — Progress Notes (Signed)
PROGRESS NOTE    [name]  PHX:[id number] DOB: 09/25/1945 DOA: 11/19/2020 PCP: Hoyt Koch, MD   Brief Narrative:  HPI per Dr. Wynetta Fines on 11/19/20 HPI: [name] is a 75 y.o. female with medical history significant of CKD stage IV, chronic diastolic CHF with severe hypertrophic cardiomyopathy, chronic hypoxic respite failure with baseline 2 to 3 L all the time, severe AS status post biosynthetic aortic valve replacement, HTN, chronic cervical spine radiculopathy, chronic ambulation dysfunction secondary to chronic severe bilateral knee OA, Gout, frequent UTIs, morbid obesity, presented with worsening of bilateral knee pain and bilateral lower extremity weakness.   Patient has a baseline severe cervical spine radiculopathy and chronic neck pain, significantly patient has been wheelchair-bound for about 2 years because of bilateral severe knee OA and ambulation dysfunction.  He was told by his cardiology "no knee surgery for your heart problems". She used to get knee injection from orthopedic surgery every 8 to 10 weeks, and last injection was in February 2022.   Lately, his husband can no longer take care of her, and patient at baseline able to transfer herself from bed to wheelchair and go to bathroom, last week, she started wearing diapers, but after 2 days she developed rash and stopped using the diapers.  Family bought him a bedside commode chair, and she tried to use once and fell down the middle of night, denied any prodromes of lightheadedness blurry vision shortness of breath. She felt knee pain and weak, and she lied on the floor until morning.. She also has had significant decrease of p.o. intakes, she drinks only, one and half 1.6 oz cup of fluid a day.  She denies any cough, no chest pains, no diarrhea no urinary problems and no fever chills.   She has a CKD stage IV, follows with nephrology Dr. Hollie Salk every 6 months, last visit was earlier this year, but she was not sure  whether blood work was done on that visit.   Patient denied any abdominal pain, no dark-colored stool, no nauseous or vomiting.   ED Course: Patient was found to have anemia creatinine 7.3, WBC 16.3, creatinine 6.2 compared to baseline 2.3 last year, BUN 185, bicarb 22, K3.1.   ED concerned about worsening of bilateral lower extremity weakness, MRI cervical spine and lumbar spine ordered however patient did not tolerate lying flat.  **Interim History Renal Fxn mimimally improving so will conslut Nephrology and they have placed her back on IVF. GI Consulted because of FOBT+. GI recommending observation currently   Assessment & Plan:   Active Problems:   Symptomatic anemia   AKI (acute kidney injury) ([place])   Chronic kidney disease   Pressure injury of skin  AKI on CKD stage IV, improving  -Has signs of dehydration probably from poor oral intake in the last few days, and an x-ray finding of possible congestion appears to be chronic,  -Dr. Roosevelt Locks discussed with on-call nephrology Dr. Jonnie Finner, plan gentle hydration, given patient also has concurrent hypoalbuminemia, will start colloid 25% of albumin every 6 hours x2 days.  Hold Lasix for today. -Dr. Jonnie Finner recommended [facility] team and it has been done today  -Ordered ultrasound of kidney, CK and uric acid level. -As of now, there is no significant indication for emergency HD. -Patient is sick with long term prognosis poor, given the already poor function status and worsening of kidney functions. Patient made aware. -Nephrology consulted and Dr. Posey Pronto recommending to Ringer's at 100 MLS per hour  and then discontinue after 2 L or if she has clinical evidence of volume excess but IVF to be continue for now -Nephrology also recommends avoiding nephrotoxic medications including NSAIDs and iodinated intravenous contrast exposure unless this is absolutely necessary next-recommended the prefer narcotic for pain control with  hydromorphone, fentanyl methadone and morphine should not be used and they are recommending avoiding baclofen and avoiding oral sodium phosphate and magnesium citrate based laxatives -Patient's BUN/Cr went from 189/5.80 -> 187/5.59 -We will do strict I's and O's and daily weights -Continue monitor and trend renal function carefully and appreciate nephrology recommendations   Acute on chronic ambulation dysfunction/bilateral lower extremity weakness, bilateral knee pain -As my evaluation, she has significant bilateral lower extremity weakness because of her uncontrolled knee pain/OA, she missed B/L knee injection after February this year, and cardiology recommend no knee surgery given her significant cardiac risk. -bilateral knee pain is quite significant, even passive movement of her leg and triggered significant bilateral knee pain, on her knee x-ray in 2019, she had a severe degenerative knee structure with bone to bone and no cartilage left. Ordered a knee x-ray to rule out fracture, and recommend she follow-up with her orthopedic surgery for regular knee injection. -Encouraged her to discuss with initiating chronic narcotic use.   -Right now I will use duloxetine and local cream. -Cervical spine and lumbar spine MRI ordered by ED physician, however I feel at this point her bilateral lower extremity weakness and knee pain can be explained by other etiology, and patient can not tolerate lying flat, so I canceled both MRI orders. -Appears that she will at least need some home care service, ordered PT evaluation and consult case management for SNF placement.   Acute on chronic normocytic anemia -Normal iron saturation, and low reticulocyte count indicating chronic anemia secondary to CKD likely. -PRBC x1 ordered in ED. -1 dose of Epogen ordered.   -Anemia panel done and showed an iron level of 20, U IBC of 81, TIBC of 101, saturation ratios of 20%, ferritin level 1097, folate of 10.9, vitamin B12 of  474 -Patient hemoglobin/hematocrit went from 7.3/22.9 is now 7.7/20 5.1 -nephrology consulted for further evaluation and they are going to start the patient on Versed 500 mg over 4 doses and begin ESA as mentioned -FOBT was positive and will discuss with GI about formal consultation; GI recommending observation and consider EGD if symptoms fail to improve; GI ordered KUB and po Protonix 40 mg po Daily  -Continue to monitor and trend  Diarrhea -Check GI Pathogen Panel and C Diff; C Diff was Negative -GI Consulted and awaiting G I Pathogen Panel -Continue to Monitor   Hypokalemia -K+ is now 2.9. Replete -C/w K+ Repletion; Give an additional 40 mEQ -Check Mag -Continue to Monitor and Replete as Necessary  -Repeat CMP in the AM    Leukocytosis -No symptoms signs of pneumonia, UA pending, given the patient recent history of diaper use, suspect UTI, one dose of Ceftriaxone given. Re-dose if UA positive for UTI -She continues to have some diarrhea and loose stools with a positive FOBT -He continues to have some loose stools we will check C. difficile and a GI pathogen panel -Continue to monitor for signs and symptoms of infection -Repeat CBC not done now    Hypoalbuminemia -As above.   Chronic Diastolic CHF -Symptoms and signs of hypovolemia> fluid overload which only shown on her chest x-ray, and clinically she appears to be dry, hydration strategy per Nephrology -Stric I's  and O's and Daily Weights -Patient is +1.249 Liters -Continue to Monitor for S/Sx of Volume Overload    Chronic Hypoxic Respiratory Failure -At her baseline, continue 2 L via nasal cannula. -SpO2: 100 % O2 Flow Rate (L/min): 2 L/min -Continue to Monitor Respiratory carefully   Chronic cervical spine radiculopathy, chronic back pain -Significant changes, hold further MRI imaging reasoned as above. -Will change Pain regimen to Fentanyl and Acetaminophen   Morbid Obesity -Complicates overall prognosis and  care -Estimated body mass index is 31.74 kg/m as calculated from the following:   Height as of this encounter: 5\' 6"  (1.676 m).   Weight as of this encounter: 89.2 kg. -Weight Loss and Dietary Counseling given   Left Lower Buttock Stage 3 Pressure Injury and Right Lower and Upper Unstagable Pressure Injury -WOC  DVT prophylaxis: SCDs Code Status: FULL CODE  Family Communication: No family present at bedside  Disposition Plan: Pending further clinical Improvement and clearance by specialists   Status is: Inpatient  Remains inpatient appropriate because:Unsafe d/c plan, IV treatments appropriate due to intensity of illness or inability to take PO, and Inpatient level of care appropriate due to severity of illness  Dispo: The patient is from: Home              Anticipated d/c is to:  TBD              Patient currently is not medically stable to d/c.   Difficult to place patient No  Consultants:  Nephrology   Procedures: None  Antimicrobials:  Anti-infectives (From admission, onward)    Start     Dose/Rate Route Frequency Ordered Stop   11/19/20 2057  cefTRIAXone (ROCEPHIN) 1 g in sodium chloride 0.9 % 100 mL IVPB        1 g 200 mL/hr over 30 Minutes Intravenous  Once 11/19/20 1847 11/19/20 2200        Subjective: Seen and examined at bedside still having diarrhea and feels very weak.  States that she has had progressive weakness for last few months.  No chest pain or shortness of breath.  Renal function is minimally improved.  Nephrology recommending continuing IV fluids given her continued diarrhea, however diarrhea is slowing.  No other concerns or complaints this time.  Objective: Vitals:   11/21/20 0315 11/21/20 0833 11/21/20 1140 11/21/20 1703  BP: 114/69 (!) 116/55 (!) 116/58 118/62  Pulse: 77 79 80 81  Resp: 20 20    Temp: 98 F (36.7 C) 98.9 F (37.2 C) 99.3 F (37.4 C) 97.7 F (36.5 C)  TempSrc: Oral Oral Oral Oral  SpO2: 97%  92% 100%  Weight:       Height:        Intake/Output Summary (Last 24 hours) at 11/21/2020 1832 Last data filed at 11/21/2020 1700 Gross per 24 hour  Intake 1157.1 ml  Output 700 ml  Net 457.1 ml    Filed Weights   11/19/20 1217 11/20/20 2327  Weight: 77.1 kg 89.2 kg   Examination: Physical Exam:  Constitutional: WN/WD overweight chronically ill-appearing African-American female who is disheveled and appears fatigued  Eyes: PERRL, lids and conjunctivae normal, sclerae anicteric  ENMT: External Ears, Nose appear normal. Grossly normal hearing Neck: Appears normal, supple, no cervical masses, normal ROM, no appreciable thyromegaly; no JVD Respiratory: Diminished to auscultation bilaterally, no wheezing, rales, rhonchi or crackles. Normal respiratory effort and patient is not tachypenic. No accessory muscle use.  Unlabored breathing but is wearing supplemental oxygen via  nasal cannula Cardiovascular: RRR, no murmurs / rubs / gallops. S1 and S2 auscultated.  1+ lower extremity edema Abdomen: Soft, non-tender, non-distended on limited skin evaluation.  Bowel sounds positive.  GU: Deferred. Musculoskeletal: No clubbing / cyanosis of digits/nails. No joint deformity upper and lower extremities.  Skin: No rashes, lesions, ulcers.  Left foot has a overgrown toenail and horn toe.  No induration; Warm and dry.  Neurologic: CN 2-12 grossly intact with no focal deficits. Romberg sign and cerebellar reflexes not assessed.  Psychiatric: Normal judgment and insight. Alert and oriented x 3.  Anxious mood and appropriate affect.   Data Reviewed: I have personally reviewed following labs and imaging studies  CBC: Recent Labs  Lab 11/19/20 1356 11/20/20 0645  WBC 16.3* 14.3*  NEUTROABS 14.2*  --   HGB 7.3* 7.7*  HCT 22.9* 25.1*  MCV 92.7 94.0  PLT 330 119    Basic Metabolic Panel: Recent Labs  Lab 11/19/20 1356 11/20/20 0645 11/21/20 0342  NA 140 142 142  K 3.1* 3.4* 2.9*  CL 102 103 104  CO2 22 22 23    GLUCOSE 119* 105* 97  BUN 185* 189* 187*  CREATININE 6.20* 5.80* 5.59*  CALCIUM 8.4* 8.8* 8.9  PHOS  --   --  6.3*    GFR: Estimated Creatinine Clearance: 9.8 mL/min (A) (by C-G formula based on SCr of 5.59 mg/dL (H)). Liver Function Tests: Recent Labs  Lab 11/19/20 1356 11/21/20 0342  AST 40  --   ALT 37  --   ALKPHOS 78  --   BILITOT 0.6  --   PROT 6.2*  --   ALBUMIN 2.0* 3.2*    No results for input(s): LIPASE, AMYLASE in the last 168 hours. No results for input(s): AMMONIA in the last 168 hours. Coagulation Profile: No results for input(s): INR, PROTIME in the last 168 hours. Cardiac Enzymes: Recent Labs  Lab 11/20/20 1557  CKTOTAL 147   BNP (last 3 results) No results for input(s): PROBNP in the last 8760 hours. HbA1C: No results for input(s): HGBA1C in the last 72 hours. CBG: No results for input(s): GLUCAP in the last 168 hours. Lipid Profile: No results for input(s): CHOL, HDL, LDLCALC, TRIG, CHOLHDL, LDLDIRECT in the last 72 hours. Thyroid Function Tests: No results for input(s): TSH, T4TOTAL, FREET4, T3FREE, THYROIDAB in the last 72 hours. Anemia Panel: Recent Labs    11/19/20 1539  VITAMINB12 474  FOLATE 10.9  FERRITIN 1,097*  TIBC 101*  IRON 20*  RETICCTPCT 2.2    Sepsis Labs: Recent Labs  Lab 11/19/20 1821  LATICACIDVEN 0.7     Recent Results (from the past 240 hour(s))  Resp Panel by RT-PCR (Flu A&B, Covid) Nasopharyngeal Swab     Status: None   Collection Time: 11/19/20  5:19 PM   Specimen: Nasopharyngeal Swab; Nasopharyngeal(NP) swabs in vial transport medium  Result Value Ref Range Status   SARS Coronavirus 2 by RT PCR NEGATIVE NEGATIVE Final    Comment: (NOTE) SARS-CoV-2 target nucleic acids are NOT DETECTED.  The SARS-CoV-2 RNA is generally detectable in upper respiratory specimens during the acute phase of infection. The lowest concentration of SARS-CoV-2 viral copies this assay can detect is 138 copies/mL. A negative  result does not preclude SARS-Cov-2 infection and should not be used as the sole basis for treatment or other patient management decisions. A negative result may occur with  improper specimen collection/handling, submission of specimen other than nasopharyngeal swab, presence of viral mutation(s) within the areas  targeted by this assay, and inadequate number of viral copies(<138 copies/mL). A negative result must be combined with clinical observations, patient history, and epidemiological information. The expected result is Negative.  Fact Sheet for Patients:  EntrepreneurPulse.com.au  Fact Sheet for Healthcare Providers:  IncredibleEmployment.be  This test is no t yet approved or cleared by the [place] FDA and  has been authorized for detection and/or diagnosis of SARS-CoV-2 by FDA under an Emergency Use Authorization (EUA). This EUA will remain  in effect (meaning this test can be used) for the duration of the COVID-19 declaration under Section 564(b)(1) of the Act, 21 U.S.C.section 360bbb-3(b)(1), unless the authorization is terminated  or revoked sooner.       Influenza A by PCR NEGATIVE NEGATIVE Final   Influenza B by PCR NEGATIVE NEGATIVE Final    Comment: (NOTE) The Xpert Xpress SARS-CoV-2/FLU/RSV plus assay is intended as an aid in the diagnosis of influenza from Nasopharyngeal swab specimens and should not be used as a sole basis for treatment. Nasal washings and aspirates are unacceptable for Xpert Xpress SARS-CoV-2/FLU/RSV testing.  Fact Sheet for Patients: EntrepreneurPulse.com.au  Fact Sheet for Healthcare Providers: IncredibleEmployment.be  This test is not yet approved or cleared by the [place] FDA and has been authorized for detection and/or diagnosis of SARS-CoV-2 by FDA under an Emergency Use Authorization (EUA). This EUA will remain in effect (meaning this test can be used)  for the duration of the COVID-19 declaration under Section 564(b)(1) of the Act, 21 U.S.C. section 360bbb-3(b)(1), unless the authorization is terminated or revoked.  Performed at [facility], [place] [address] [place], [place] [address]   C Difficile Quick Screen w PCR reflex     Status: None   Collection Time: 11/20/20  6:48 PM   Specimen: STOOL  Result Value Ref Range Status   C Diff antigen NEGATIVE NEGATIVE Final   C Diff toxin NEGATIVE NEGATIVE Final   C Diff interpretation No C. difficile detected.  Final    Comment: Performed at [facility], [place] [address], [place], [place] [address]    RN Pressure Injury Documentation: Pressure Injury 11/20/20 Upper;Lower;Left Stage 3 -  Full thickness tissue loss. Subcutaneous fat may be visible but bone, tendon or muscle are NOT exposed. (Active)  11/20/20   Location:   Location Orientation: Upper;Lower;Left  Staging: Stage 3 -  Full thickness tissue loss. Subcutaneous fat may be visible but bone, tendon or muscle are NOT exposed.  Wound Description (Comments):   Present on Admission: Yes     Pressure Injury 11/20/20 Right;Lower;Upper Unstageable - Full thickness tissue loss in which the base of the injury is covered by slough (yellow, tan, gray, green or brown) and/or eschar (tan, brown or black) in the wound bed. (Active)  11/20/20   Location:   Location Orientation: Right;Lower;Upper  Staging: Unstageable - Full thickness tissue loss in which the base of the injury is covered by slough (yellow, tan, gray, green or brown) and/or eschar (tan, brown or black) in the wound bed.  Wound Description (Comments):   Present on Admission: Yes     Estimated body mass index is 31.74 kg/m as calculated from the following:   Height as of this encounter: 5\' 6"  (1.676 m).   Weight as of this encounter: 89.2 kg.  Malnutrition Type:   Malnutrition Characteristics:   Nutrition Interventions:    Radiology Studies: US  RENAL  Result Date: 11/13/2020 CLINICAL DATA:  Acute kidney injury. EXAM: RENAL /  URINARY TRACT ULTRASOUND COMPLETE COMPARISON:  Noncontrast CT 05/28/2017 FINDINGS: Right Kidney: Renal measurements: 10.5 x 4.3 x 3.9 cm = volume: 92 mL. No hydronephrosis. Increased renal parenchymal echogenicity. 1 cm cyst in the anterior superior kidney. No visualized stone or solid lesion. Left Kidney: Renal measurements: 8.1 x 3.9 x 4.9 cm = volume: 81 mL. No hydronephrosis. Diffusely increased renal parenchymal echogenicity. Exophytic cyst arising from the lower kidney measures 5.9 x 4.9 x 5.4 cm. No visualized stone or solid lesion. Bladder: Appears normal for degree of bladder distention. Both ureteral jets are seen. Other: None. IMPRESSION: 1. No hydronephrosis or obstructive uropathy. 2. Increased renal parenchymal echogenicity typical of chronic medical renal disease. 3. Bilateral renal cysts. Electronically Signed   By: Keith Rake M.D.   On: 11/19/2020 20:00   DG Chest Port 1 View  Result Date: 11/20/2020 CLINICAL DATA:  Shortness of breath. EXAM: PORTABLE CHEST 1 VIEW COMPARISON:  Chest x-ray dated March 08, 2020. FINDINGS: Stable cardiomegaly status post AVR. Chronic mild pulmonary vascular congestion. No focal consolidation, pleural effusion, or pneumothorax. No acute osseous abnormality. IMPRESSION: 1. Chronic mild pulmonary vascular congestion. Electronically Signed   By: Titus Dubin M.D.   On: 11/20/2020 07:31   DG Abd Portable 1V  Result Date: 11/21/2020 CLINICAL DATA:  Diarrhea. EXAM: PORTABLE ABDOMEN - 1 VIEW COMPARISON:  05/31/2017 FINDINGS: The lung bases are not included. The bowel gas pattern is unremarkable. No findings for obstruction. The soft tissue shadows are grossly maintained. No worrisome calcifications. The bony structures are unremarkable. IMPRESSION: Unremarkable abdominal radiographs. Electronically Signed   By: Marijo Sanes M.D.   On: 11/21/2020 14:49    Scheduled Meds:   amLODipine  5 mg Oral QHS   aspirin EC  81 mg Oral Daily   calcitRIOL  0.25 mcg Oral Once per day on Mon Fri   capsicum   Topical BID   carvedilol  25 mg Oral BID WC   collagenase   Topical Daily   [START ON 11/28/2020] darbepoetin (ARANESP) injection - NON-DIALYSIS  100 mcg Subcutaneous Q Tue-1800   DULoxetine  30 mg Oral Daily   gabapentin  100 mg Oral QHS   heparin injection (subcutaneous)  5,000 Units Subcutaneous Q12H   lidocaine  2 patch Transdermal Q24H   multivitamin with minerals  1 tablet Oral Daily   [START ON 11/22/2020] pantoprazole  40 mg Oral Q0600   potassium chloride  20 mEq Oral BID   Continuous Infusions:  ferric gluconate (FERRLECIT) IVPB Stopped (11/21/20 1256)   lactated ringers 100 mL/hr at 11/21/20 1422    LOS: 2 days   Kerney Elbe, DO [facility] PAGER is on AMION  If 7PM-7AM, please contact night-coverage [email or web address]

## 2020-11-22 DIAGNOSIS — I5032 Chronic diastolic (congestive) heart failure: Secondary | ICD-10-CM

## 2020-11-22 LAB — GASTROINTESTINAL PANEL BY PCR, STOOL (REPLACES STOOL CULTURE)

## 2020-11-22 LAB — CBC WITH DIFFERENTIAL/PLATELET
Abs Immature Granulocytes: 0.6 10*3/uL — ABNORMAL HIGH (ref 0.00–0.07)
Basophils Absolute: 0 10*3/uL (ref 0.0–0.1)
Basophils Relative: 0 %
Eosinophils Absolute: 0.2 10*3/uL (ref 0.0–0.5)
Eosinophils Relative: 1 %
HCT: 25.1 % — ABNORMAL LOW (ref 36.0–46.0)
Hemoglobin: 7.8 g/dL — ABNORMAL LOW (ref 12.0–15.0)
Immature Granulocytes: 4 %
Lymphocytes Relative: 5 %
Lymphs Abs: 0.7 10*3/uL (ref 0.7–4.0)
MCH: 29.2 pg (ref 26.0–34.0)
MCHC: 31.1 g/dL (ref 30.0–36.0)
MCV: 94 fL (ref 80.0–100.0)
Monocytes Absolute: 0.7 10*3/uL (ref 0.1–1.0)
Monocytes Relative: 5 %
Neutro Abs: 11.6 10*3/uL — ABNORMAL HIGH (ref 1.7–7.7)
Neutrophils Relative %: 85 %
Platelets: 309 10*3/uL (ref 150–400)
RBC: 2.67 MIL/uL — ABNORMAL LOW (ref 3.87–5.11)
RDW: 16 % — ABNORMAL HIGH (ref 11.5–15.5)
WBC: 13.8 10*3/uL — ABNORMAL HIGH (ref 4.0–10.5)
nRBC: 0 % (ref 0.0–0.2)

## 2020-11-22 LAB — MAGNESIUM: Magnesium: 2.1 mg/dL (ref 1.7–2.4)

## 2020-11-22 LAB — COMPREHENSIVE METABOLIC PANEL
ALT: 21 U/L (ref 0–44)
AST: 22 U/L (ref 15–41)
Albumin: 3.6 g/dL (ref 3.5–5.0)
Alkaline Phosphatase: 54 U/L (ref 38–126)
Anion gap: 16 — ABNORMAL HIGH (ref 5–15)
BUN: 180 mg/dL — ABNORMAL HIGH (ref 8–23)
CO2: 20 mmol/L — ABNORMAL LOW (ref 22–32)
Calcium: 9.2 mg/dL (ref 8.9–10.3)
Chloride: 107 mmol/L (ref 98–111)
Creatinine, Ser: 5.06 mg/dL — ABNORMAL HIGH (ref 0.44–1.00)
GFR, Estimated: 8 mL/min — ABNORMAL LOW (ref >60)
Glucose, Bld: 90 mg/dL (ref 70–99)
Potassium: 4.1 mmol/L (ref 3.5–5.1)
Sodium: 143 mmol/L (ref 135–145)
Total Bilirubin: 1.4 mg/dL — ABNORMAL HIGH (ref 0.3–1.2)
Total Protein: 6.6 g/dL (ref 6.5–8.1)

## 2020-11-22 LAB — TYPE AND SCREEN
ABO/RH(D): A POS
ABO/RH(D): A POS
Antibody Screen: NEGATIVE
Antibody Screen: NEGATIVE
Unit division: 0
Unit division: 0

## 2020-11-22 LAB — BPAM RBC
Blood Product Expiration Date: 202209062359
Blood Product Expiration Date: 202209072359
ISSUE DATE / TIME: 202208142007
ISSUE DATE / TIME: 202208162020
Unit Type and Rh: 6200
Unit Type and Rh: 6200

## 2020-11-22 LAB — PHOSPHORUS: Phosphorus: 5.7 mg/dL — ABNORMAL HIGH (ref 2.5–4.6)

## 2020-11-22 MED ORDER — LACTATED RINGERS IV SOLN: INTRAVENOUS | Status: AC

## 2020-11-22 NOTE — Progress Notes (Signed)
PROGRESS NOTE    [name]  SVX:[id number] DOB: 22-Apr-1945 DOA: 11/19/2020 PCP: Hoyt Koch, MD   Brief Narrative:  75 y.o. female with medical history significant of pulmonary HTN; HTN; stage 4 CKD; s/p bioprosthetic AVR with AAA repair (05/2017)HFpEF,Chronic hypoxic respiratory failure on 2 L-3 L at all times, Severe OA, chronic lower extremity weakness-barely ambulatory at home-presented with worsening weakness/diarrhea-she was found to have AKI and subsequently admitted to the hospitalist service.  See below for further details.  Subjective:  Diarrhea is better-continues to have significant weakness in lower extremities.  Assessment & Plan: AKI on CKD stage IV: Thought to be hemodynamically mediated kidney injury in the setting of diarrhea and diuretic use-some improvement overnight-no emergent indications for HD-nephrology following and directing care.  Nephrology plans to continue IVF.  Normocytic anemia: No overt GI bleeding apparent-although FOBT positive.  Suspect anemia is-probably due to acute illness/underlying CKD-has required 2 units of PRBC so far.  Continue to follow CBC and transfuse accordingly.  Epogen/IV iron per nephrology.  Diarrhea: Significantly improved with supportive care.  C. difficile studies and GI pathogen panel was negative.  Severe OA with chronic bilateral lower extremity weakness/ambulatory dysfunction: Per patient-she was very minimally ambulatory at baseline (per H&P mostly wheelchair-bound-but just about able to transfer from bed to wheelchair)-Per patient-due to her significant comorbid medical conditions-her outpatient cardiologist has recommended against any surgical procedures.  She apparently follows with outpatient orthopedics for intra-articular steroid injections.  Continue PT/OT eval-plans are for SNF.  HTN: BP stable-continue budipine and Coreg.  HFpEF: Volume status stable-avoid diuretics given AKI.  History of severe AS-s/p  aortic valve replacement with bioprosthetic valve   History of repair of ascending aortic aneurysm  Chronic hypoxic respiratory failure: On 2-3 L of oxygen at home  History of chronic back pain-history of C-spine/L-spine degenerative changes with spinal stenosis: Suspect her spinal stenosis and other issues have worsened lately-and may be contributing to her chronic weakness.  She has been mostly wheelchair-bound-and very minimally ambulatory at baseline.  Given her numerous other medical comorbidities-she has been told by her outpatient physicians that she is no longer a surgical candidate.  We will focus on PT/OT and other supportive care.  In the interim-continue with Lidoderm patches, Neurontin and duloxetine.  Obesity: Body mass index is 31.74 kg/m.   Pressure ulcer: Pressure Injury 11/20/20 Upper;Lower;Left Stage 3 -  Full thickness tissue loss. Subcutaneous fat may be visible but bone, tendon or muscle are NOT exposed. (Active)  11/20/20   Location:   Location Orientation: Upper;Lower;Left  Staging: Stage 3 -  Full thickness tissue loss. Subcutaneous fat may be visible but bone, tendon or muscle are NOT exposed.  Wound Description (Comments):   Present on Admission: Yes     Pressure Injury 11/20/20 Right;Lower;Upper Unstageable - Full thickness tissue loss in which the base of the injury is covered by slough (yellow, tan, gray, green or brown) and/or eschar (tan, brown or black) in the wound bed. (Active)  11/20/20   Location:   Location Orientation: Right;Lower;Upper  Staging: Unstageable - Full thickness tissue loss in which the base of the injury is covered by slough (yellow, tan, gray, green or brown) and/or eschar (tan, brown or black) in the wound bed.  Wound Description (Comments):   Present on Admission: Yes   DVT prophylaxis: SCDs Code Status: FULL CODE  Family Communication: Spouse-[phone]-[phone] to leave a voicemail on 8/17 Disposition Plan:  SNF  Status is: Inpatient  Remains inpatient appropriate because:Unsafe d/c plan,  IV treatments appropriate due to intensity of illness or inability to take PO, and Inpatient level of care appropriate due to severity of illness  Dispo: The patient is from: Home              Anticipated d/c is to:  TBD              Patient currently is not medically stable to d/c.   Difficult to place patient No  Consultants:  Nephrology   Procedures: None  Antimicrobials:  Anti-infectives (From admission, onward)    Start     Dose/Rate Route Frequency Ordered Stop   11/19/20 2057  cefTRIAXone (ROCEPHIN) 1 g in sodium chloride 0.9 % 100 mL IVPB        1 g 200 mL/hr over 30 Minutes Intravenous  Once 11/19/20 1847 11/19/20 2200        Objective: Vitals:   11/22/20 0000 11/22/20 0400 11/22/20 0844 11/22/20 1157  BP: 130/66 124/64 (!) 115/56 126/62  Pulse: 83 83 77 76  Resp: 20 20 20 20   Temp: 98.9 F (37.2 C) 99.6 F (37.6 C) 98.7 F (37.1 C) 99.2 F (37.3 C)  TempSrc: Oral Oral Oral Oral  SpO2: 93% 93% 96% 93%  Weight:      Height:        Intake/Output Summary (Last 24 hours) at 11/22/2020 1323 Last data filed at 11/21/2020 2234 Gross per 24 hour  Intake 990 ml  Output 700 ml  Net 290 ml    Filed Weights   11/19/20 1217 11/20/20 2327  Weight: 77.1 kg 89.2 kg   Examination: Gen Exam:Alert awake-not in any distress HEENT:atraumatic, normocephalic Chest: B/L clear to auscultation anteriorly CVS:S1S2 regular Abdomen:soft non tender, non distended Extremities:no edema Neurology: Moves leg side-by-side-not able to lift off the bed. Skin: no rash   Data Reviewed: I have personally reviewed following labs and imaging studies  CBC: Recent Labs  Lab 11/19/20 1356 11/20/20 0645 11/21/20 1908 11/22/20 0019  WBC 16.3* 14.3* 13.5* 13.8*  NEUTROABS 14.2*  --  11.6* 11.6*  HGB 7.3* 7.7* 6.9* 7.8*  HCT 22.9* 25.1* 22.0* 25.1*  MCV 92.7 94.0 93.2 94.0  PLT 330 287 290 309     Basic Metabolic Panel: Recent Labs  Lab 11/19/20 1356 11/20/20 0645 11/21/20 0342 11/22/20 0019  NA 140 142 142 143  K 3.1* 3.4* 2.9* 4.1  CL 102 103 104 107  CO2 22 22 23  20*  GLUCOSE 119* 105* 97 90  BUN 185* 189* 187* 180*  CREATININE 6.20* 5.80* 5.59* 5.06*  CALCIUM 8.4* 8.8* 8.9 9.2  MG  --   --   --  2.1  PHOS  --   --  6.3* 5.7*    GFR: Estimated Creatinine Clearance: 10.8 mL/min (A) (by C-G formula based on SCr of 5.06 mg/dL (H)). Liver Function Tests: Recent Labs  Lab 11/19/20 1356 11/21/20 0342 11/22/20 0019  AST 40  --  22  ALT 37  --  21  ALKPHOS 78  --  54  BILITOT 0.6  --  1.4*  PROT 6.2*  --  6.6  ALBUMIN 2.0* 3.2* 3.6    No results for input(s): LIPASE, AMYLASE in the last 168 hours. No results for input(s): AMMONIA in the last 168 hours. Coagulation Profile: No results for input(s): INR, PROTIME in the last 168 hours. Cardiac Enzymes: Recent Labs  Lab 11/20/20 1557  CKTOTAL 147    BNP (last 3 results) No results for input(s): PROBNP  in the last 8760 hours. HbA1C: No results for input(s): HGBA1C in the last 72 hours. CBG: No results for input(s): GLUCAP in the last 168 hours. Lipid Profile: No results for input(s): CHOL, HDL, LDLCALC, TRIG, CHOLHDL, LDLDIRECT in the last 72 hours. Thyroid Function Tests: No results for input(s): TSH, T4TOTAL, FREET4, T3FREE, THYROIDAB in the last 72 hours. Anemia Panel: Recent Labs    11/19/20 1539  VITAMINB12 474  FOLATE 10.9  FERRITIN 1,097*  TIBC 101*  IRON 20*  RETICCTPCT 2.2    Sepsis Labs: Recent Labs  Lab 11/19/20 1821  LATICACIDVEN 0.7     Recent Results (from the past 240 hour(s))  Resp Panel by RT-PCR (Flu A&B, Covid) Nasopharyngeal Swab     Status: None   Collection Time: 11/19/20  5:19 PM   Specimen: Nasopharyngeal Swab; Nasopharyngeal(NP) swabs in vial transport medium  Result Value Ref Range Status   SARS Coronavirus 2 by RT PCR NEGATIVE NEGATIVE Final    Comment:  (NOTE) SARS-CoV-2 target nucleic acids are NOT DETECTED.  The SARS-CoV-2 RNA is generally detectable in upper respiratory specimens during the acute phase of infection. The lowest concentration of SARS-CoV-2 viral copies this assay can detect is 138 copies/mL. A negative result does not preclude SARS-Cov-2 infection and should not be used as the sole basis for treatment or other patient management decisions. A negative result may occur with  improper specimen collection/handling, submission of specimen other than nasopharyngeal swab, presence of viral mutation(s) within the areas targeted by this assay, and inadequate number of viral copies(<138 copies/mL). A negative result must be combined with clinical observations, patient history, and epidemiological information. The expected result is Negative.  Fact Sheet for Patients:  EntrepreneurPulse.com.au  Fact Sheet for Healthcare Providers:  IncredibleEmployment.be  This test is no t yet approved or cleared by the [place] FDA and  has been authorized for detection and/or diagnosis of SARS-CoV-2 by FDA under an Emergency Use Authorization (EUA). This EUA will remain  in effect (meaning this test can be used) for the duration of the COVID-19 declaration under Section 564(b)(1) of the Act, 21 U.S.C.section 360bbb-3(b)(1), unless the authorization is terminated  or revoked sooner.       Influenza A by PCR NEGATIVE NEGATIVE Final   Influenza B by PCR NEGATIVE NEGATIVE Final    Comment: (NOTE) The Xpert Xpress SARS-CoV-2/FLU/RSV plus assay is intended as an aid in the diagnosis of influenza from Nasopharyngeal swab specimens and should not be used as a sole basis for treatment. Nasal washings and aspirates are unacceptable for Xpert Xpress SARS-CoV-2/FLU/RSV testing.  Fact Sheet for Patients: EntrepreneurPulse.com.au  Fact Sheet for Healthcare  Providers: IncredibleEmployment.be  This test is not yet approved or cleared by the [place] FDA and has been authorized for detection and/or diagnosis of SARS-CoV-2 by FDA under an Emergency Use Authorization (EUA). This EUA will remain in effect (meaning this test can be used) for the duration of the COVID-19 declaration under Section 564(b)(1) of the Act, 21 U.S.C. section 360bbb-3(b)(1), unless the authorization is terminated or revoked.  Performed at [facility], [place] [address], [place], [place] [address]   Gastrointestinal Panel by PCR , Stool     Status: None   Collection Time: 11/20/20  6:48 PM   Specimen: Stool  Result Value Ref Range Status   Campylobacter species NOT DETECTED NOT DETECTED Final   Plesimonas shigelloides NOT DETECTED NOT DETECTED Final   Salmonella species NOT DETECTED NOT DETECTED Final  Yersinia enterocolitica NOT DETECTED NOT DETECTED Final   Vibrio species NOT DETECTED NOT DETECTED Final   Vibrio cholerae NOT DETECTED NOT DETECTED Final   Enteroaggregative E coli (EAEC) NOT DETECTED NOT DETECTED Final   Enteropathogenic E coli (EPEC) NOT DETECTED NOT DETECTED Final   Enterotoxigenic E coli (ETEC) NOT DETECTED NOT DETECTED Final   Shiga like toxin producing E coli (STEC) NOT DETECTED NOT DETECTED Final   Shigella/Enteroinvasive E coli (EIEC) NOT DETECTED NOT DETECTED Final   Cryptosporidium NOT DETECTED NOT DETECTED Final   Cyclospora cayetanensis NOT DETECTED NOT DETECTED Final   Entamoeba histolytica NOT DETECTED NOT DETECTED Final   Giardia lamblia NOT DETECTED NOT DETECTED Final   Adenovirus F40/41 NOT DETECTED NOT DETECTED Final   Astrovirus NOT DETECTED NOT DETECTED Final   Norovirus GI/GII NOT DETECTED NOT DETECTED Final   Rotavirus A NOT DETECTED NOT DETECTED Final   Sapovirus (I, II, IV, and V) NOT DETECTED NOT DETECTED Final    Comment: Performed at [facility], [address] [place], [place]  [address]  C Difficile Quick Screen w PCR reflex     Status: None   Collection Time: 11/20/20  6:48 PM   Specimen: STOOL  Result Value Ref Range Status   C Diff antigen NEGATIVE NEGATIVE Final   C Diff toxin NEGATIVE NEGATIVE Final   C Diff interpretation No C. difficile detected.  Final    Comment: Performed at [facility], [place] [address], [place], [place] [address]    RN Pressure Injury Documentation: Pressure Injury 11/20/20 Upper;Lower;Left Stage 3 -  Full thickness tissue loss. Subcutaneous fat may be visible but bone, tendon or muscle are NOT exposed. (Active)  11/20/20   Location:   Location Orientation: Upper;Lower;Left  Staging: Stage 3 -  Full thickness tissue loss. Subcutaneous fat may be visible but bone, tendon or muscle are NOT exposed.  Wound Description (Comments):   Present on Admission: Yes     Pressure Injury 11/20/20 Right;Lower;Upper Unstageable - Full thickness tissue loss in which the base of the injury is covered by slough (yellow, tan, gray, green or brown) and/or eschar (tan, brown or black) in the wound bed. (Active)  11/20/20   Location:   Location Orientation: Right;Lower;Upper  Staging: Unstageable - Full thickness tissue loss in which the base of the injury is covered by slough (yellow, tan, gray, green or brown) and/or eschar (tan, brown or black) in the wound bed.  Wound Description (Comments):   Present on Admission: Yes     Estimated body mass index is 31.74 kg/m as calculated from the following:   Height as of this encounter: 5\' 6"  (1.676 m).   Weight as of this encounter: 89.2 kg.  Malnutrition Type:   Malnutrition Characteristics:   Nutrition Interventions:    Radiology Studies: DG Abd Portable 1V  Result Date: 11/21/2020 CLINICAL DATA:  Diarrhea. EXAM: PORTABLE ABDOMEN - 1 VIEW COMPARISON:  05/31/2017 FINDINGS: The lung bases are not included. The bowel gas pattern is unremarkable. No findings for obstruction. The soft tissue  shadows are grossly maintained. No worrisome calcifications. The bony structures are unremarkable. IMPRESSION: Unremarkable abdominal radiographs. Electronically Signed   By: Marijo Sanes M.D.   On: 11/21/2020 14:49    Scheduled Meds:  sodium chloride   Intravenous Once   amLODipine  5 mg Oral QHS   aspirin EC  81 mg Oral Daily   calcitRIOL  0.25 mcg Oral Once per day on Mon Fri  capsicum   Topical BID   carvedilol  25 mg Oral BID WC   collagenase   Topical Daily   [START ON 11/28/2020] darbepoetin (ARANESP) injection - NON-DIALYSIS  100 mcg Subcutaneous Q Tue-1800   DULoxetine  30 mg Oral Daily   gabapentin  100 mg Oral QHS   heparin injection (subcutaneous)  5,000 Units Subcutaneous Q12H   lidocaine  2 patch Transdermal Q24H   multivitamin with minerals  1 tablet Oral Daily   pantoprazole  40 mg Oral Q0600   Continuous Infusions:  ferric gluconate (FERRLECIT) IVPB 125 mg (11/22/20 1057)   lactated ringers 100 mL/hr at 11/22/20 1159    LOS: 3 days   Marysa Wessner, DO [facility] PAGER is on AMION  If 7PM-7AM, please contact night-coverage [email or web address]

## 2020-11-22 NOTE — TOC Progression Note (Signed)
Transition of Care [facility]) - Progression Note    Patient Details  Name: [name] MRN: [id number] Date of Birth: 1945/05/17  Transition of Care [facility]) CM/SW [place], LCSW Phone Number: 11/22/2020, 12:39 PM  Clinical Narrative:    CSW received consult for possible SNF placement at time of discharge. CSW spoke with patient. Patient reported that patient's spouse is currently unable to care for patient at their home given patient's current physical needs and fall risk. Patient expressed understanding of PT recommendation and is agreeable to SNF placement at time of discharge. CSW discussed insurance authorization process and provided Medicare SNF ratings list and offers. Patient has had family members in [place] so she is leaning towards that but will talk with her husband when he comes to visit today and confirm with CSW tomorrow. Patient has received three COVID vaccines. Patient expressed being hopeful for rehab and to feel better soon. No further questions reported at this time.   Skilled Nursing Rehab Facilities-   [email or web address] *Ratings updated quarterly   Ratings out of 5 possible   Name Address  Phone # Quality Care Staffing Health Inspection Overall  [facility] [address] [place] 5  4 4   [facility]  [address] [place], [place] [phone] 4 2 4 4   [facility] [place], [place] 2 3 1 1   [place], [place] 3 3 4 4   [facility] [address], [place] 3  2 2   [address] [place] 2 2 4 4   [facility] [address], [place] 5 2 2 3   [facility] [address] [place] 4 2 2 2   [facility] [place], [place] [phone] 5 2 2 3   [facility] [phone],  [place] [phone] 5 1 2 2   [facility] [address] [facility] [phone] 5 2 2 3   [place] [address] [place] [phone] 3 1 1 1       Expected Discharge Plan: [place] Barriers to Discharge: Insurance Authorization, Continued Medical Work up  Expected Discharge Plan and Services Expected Discharge Plan: [place] In-house Referral: Clinical Social Work Discharge Planning Services: CM Consult Post Acute Care Choice: [place] Living arrangements for the past 2 months: Single Family Home                                       Social Determinants of Health (SDOH) Interventions    Readmission Risk Interventions Readmission Risk Prevention Plan 07/15/2018  Post Dischage Appt Complete  Medication Screening Complete  Transportation Screening Complete  Some recent data might be hidden

## 2020-11-22 NOTE — Progress Notes (Signed)
Patient ID: [name], female   DOB: 1945-04-26, 75 y.o.   MRN: [id number] [facility] Progress Note   Assessment/ Plan:   1.  Acute kidney injury on chronic kidney disease stage IV: Likely acute kidney injury superimposed on progression of underlying chronic kidney disease (last had labs/saw nephrology 8 months ago).  Current acute injury appears to be hemodynamically mediated and improving slowly with intravenous fluids and now oral intake-no acute electrolyte abnormalities and no indication for dialysis at this time despite her significant azotemia.  I will order an additional liter of LR today. 2.  Hypokalemia: Secondary to poor intake/diarrhea-corrected with oral replacement. 3.  Chronic hypoxic respiratory failure: She appears to be at baseline with regards to her respiratory status on oxygen supplementation at 2 L/min via nasal cannula. 4.  Anemia of chronic kidney disease/chronic illness: With iron deficiency and associated anemia, on intravenous iron supplementation.  On Ar[place]. 5.  Chronic cervical spine radiculopathy with chronic back pain with acute on chronic ambulation dysfunction: Suspect that with her limitations and current social support, she will unlikely be able to return home and will need placement at a skilled nursing facility.  Subjective:   Informs me that she feels fatigued but overall feeling a little better-still unable to move her legs around.   Objective:   BP (!) 115/56 (BP Location: Left Arm)   Pulse 77   Temp 98.7 F (37.1 C) (Oral)   Resp 20   Ht 5\' 6"  (1.676 m)   Wt 89.2 kg   SpO2 96%   BMI 31.74 kg/m   Intake/Output Summary (Last 24 hours) at 11/22/2020 0948 Last data filed at 11/21/2020 2234 Gross per 24 hour  Intake 1140 ml  Output 700 ml  Net 440 ml   Weight change:   Physical Exam: Gen: Appears comfortable resting in bed, awakens to conversation CVS: Pulse regular rhythm, normal rate, S1 and S2 normal Resp: Anteriorly clear  to auscultation, no rales/rhonchi Abd: Soft, moderately distended/obese, some discomfort/tenderness over lower quadrants Ext: Trace lower extremity edema with dry skin and long/unkempt toenails  Imaging: DG Abd Portable 1V  Result Date: 11/21/2020 CLINICAL DATA:  Diarrhea. EXAM: PORTABLE ABDOMEN - 1 VIEW COMPARISON:  05/31/2017 FINDINGS: The lung bases are not included. The bowel gas pattern is unremarkable. No findings for obstruction. The soft tissue shadows are grossly maintained. No worrisome calcifications. The bony structures are unremarkable. IMPRESSION: Unremarkable abdominal radiographs. Electronically Signed   By: Marijo Sanes M.D.   On: 11/21/2020 14:49    Labs: BMET Recent Labs  Lab 11/19/20 1356 11/20/20 0645 11/21/20 0342 11/22/20 0019  NA 140 142 142 143  K 3.1* 3.4* 2.9* 4.1  CL 102 103 104 107  CO2 22 22 23  20*  GLUCOSE 119* 105* 97 90  BUN 185* 189* 187* 180*  CREATININE 6.20* 5.80* 5.59* 5.06*  CALCIUM 8.4* 8.8* 8.9 9.2  PHOS  --   --  6.3* 5.7*   CBC Recent Labs  Lab 11/19/20 1356 11/20/20 0645 11/21/20 1908 11/22/20 0019  WBC 16.3* 14.3* 13.5* 13.8*  NEUTROABS 14.2*  --  11.6* 11.6*  HGB 7.3* 7.7* 6.9* 7.8*  HCT 22.9* 25.1* 22.0* 25.1*  MCV 92.7 94.0 93.2 94.0  PLT 330 287 290 309    Medications:     sodium chloride   Intravenous Once   amLODipine  5 mg Oral QHS   aspirin EC  81 mg Oral Daily   calcitRIOL  0.25 mcg Oral Once per day  on Mon Fri   capsicum   Topical BID   carvedilol  25 mg Oral BID WC   collagenase   Topical Daily   [START ON 11/28/2020] darbepoetin (ARANESP) injection - NON-DIALYSIS  100 mcg Subcutaneous Q Tue-1800   DULoxetine  30 mg Oral Daily   gabapentin  100 mg Oral QHS   heparin injection (subcutaneous)  5,000 Units Subcutaneous Q12H   lidocaine  2 patch Transdermal Q24H   multivitamin with minerals  1 tablet Oral Daily   pantoprazole  40 mg Oral Q0600   Elmarie Shiley, MD 11/22/2020, 9:48 AM

## 2020-11-23 DIAGNOSIS — R195 Other fecal abnormalities: Secondary | ICD-10-CM

## 2020-11-23 LAB — CBC
HCT: 25.7 % — ABNORMAL LOW (ref 36.0–46.0)
Hemoglobin: 8.1 g/dL — ABNORMAL LOW (ref 12.0–15.0)
MCH: 29.2 pg (ref 26.0–34.0)
MCHC: 31.5 g/dL (ref 30.0–36.0)
MCV: 92.8 fL (ref 80.0–100.0)
Platelets: 337 10*3/uL (ref 150–400)
RBC: 2.77 MIL/uL — ABNORMAL LOW (ref 3.87–5.11)
RDW: 15.9 % — ABNORMAL HIGH (ref 11.5–15.5)
WBC: 14 10*3/uL — ABNORMAL HIGH (ref 4.0–10.5)
nRBC: 0 % (ref 0.0–0.2)

## 2020-11-23 LAB — RENAL FUNCTION PANEL
Albumin: 3.1 g/dL — ABNORMAL LOW (ref 3.5–5.0)
Anion gap: 15 (ref 5–15)
BUN: 169 mg/dL — ABNORMAL HIGH (ref 8–23)
CO2: 23 mmol/L (ref 22–32)
Calcium: 9.7 mg/dL (ref 8.9–10.3)
Chloride: 105 mmol/L (ref 98–111)
Creatinine, Ser: 4.99 mg/dL — ABNORMAL HIGH (ref 0.44–1.00)
GFR, Estimated: 9 mL/min — ABNORMAL LOW (ref >60)
Glucose, Bld: 117 mg/dL — ABNORMAL HIGH (ref 70–99)
Phosphorus: 5.6 mg/dL — ABNORMAL HIGH (ref 2.5–4.6)
Potassium: 3.8 mmol/L (ref 3.5–5.1)
Sodium: 143 mmol/L (ref 135–145)

## 2020-11-23 MED ORDER — SODIUM CHLORIDE 0.9 % IV SOLN: INTRAVENOUS | Status: DC

## 2020-11-23 NOTE — Progress Notes (Signed)
PT Cancellation Note  Patient Details Name: [name] MRN: [id number] DOB: [birth year]   Cancelled Treatment:    Reason Eval/Treat Not Completed: Patient declined, no reason specified Politely declines PT today- has been sick to her stomach all day and tells me "I can't do a thing right now". Will continue efforts.   Windell Norfolk, DPT, PN2   Supplemental Physical Therapist [place]    Pager [phone] Acute Rehab Office [phone]

## 2020-11-23 NOTE — Care Management Important Message (Signed)
Important Message  Patient Details  Name: [name] MRN: [id number] Date of Birth: 27-Apr-1945   Medicare Important Message Given:  Yes     Orbie Pyo 11/23/2020, 2:36 PM

## 2020-11-23 NOTE — Consult Note (Signed)
   [facility] Inpatient Consult   11/23/2020  Kariss Longmire [birth year] [id number]  [facility] [ACO] Patient: [name]  Patient was screened for Embedded practice review which patient has been in the chronic care management Embedded Care Management  program and active with the pharmacist team.  Plan: Follow up with the [facility] Management and made aware of needs for transition of care.   Please contact for further questions,  Martha Brood, RN BSN [facility] Liaison  [phone] business mobile phone Toll free office [phone]  Fax number: [phone] [email or web address]

## 2020-11-23 NOTE — Progress Notes (Signed)
PROGRESS NOTE    [name]  LFY:[id number] DOB: 15-Apr-1945 DOA: 11/19/2020 PCP: Hoyt Koch, MD   Brief Narrative:  75 y.o. female with medical history significant of pulmonary HTN; HTN; stage 4 CKD; s/p bioprosthetic AVR with AAA repair (05/2017)HFpEF,Chronic hypoxic respiratory failure on 2 L-3 L at all times, Severe OA, chronic lower extremity weakness-barely ambulatory at home-presented with worsening weakness/diarrhea-she was found to have AKI and subsequently admitted to the hospitalist service.  See below for further details.  Subjective:  Feels better-less nauseous-diarrhea rapidly improving.  Seems to be moving her bilateral lower extremity little bit more today compared to yesterday.  Assessment & Plan: AKI on CKD stage IV: AKI likely hemodynamically mediated-in the setting of diarrhea and diuretic use.  Continue to avoid nephrotoxic agents-continue IV fluids-hopefully creatinine will downtrend further.  Nephrology following.    Normocytic anemia: Hemoglobin stable-no overt GI bleeding apparent even though she is FOBT positive.  Continue to follow CBC.  Suspect anemia is probably due to acute illness superimposed on anemia of CKD-required 2 units of PRBC so far.  Aranesp/IV iron defer to nephrology.  Diarrhea: Significantly improved with supportive care.  C. difficile studies and GI pathogen panel was negative.  Severe OA with chronic bilateral lower extremity weakness/ambulatory dysfunction: Per patient-she was very minimally ambulatory at baseline (per H&P mostly wheelchair-bound-but just about able to transfer from bed to wheelchair)-suspect that lower extremity weakness is mostly chronic-and due to OA/degenerative spine disease/spinal stenosis.  Per patient-due to her significant comorbid medical conditions-her outpatient cardiologist has recommended against any surgical procedures.  She apparently follows with outpatient orthopedics for intra-articular steroid  injections.  Plan is to continue supportive care-continue PT/OT-with overall improvement in her renal function and improvement in her debility/weakness due to renal failure-hopefully her lower extremity weakness will improve somewhat.  Plans are for SNF on discharge  HTN: BP stable-continue amlodipine and Coreg  HFpEF: Volume status stable-avoid diuretics given AKI.  History of severe AS-s/p aortic valve replacement with bioprosthetic valve   History of repair of ascending aortic aneurysm  Chronic hypoxic respiratory failure: On 2-3 L of oxygen at home  History of chronic back pain-history of C-spine/L-spine degenerative changes with spinal stenosis: Suspect her spinal stenosis and other issues have worsened lately-and may be contributing to her chronic weakness.  She has been mostly wheelchair-bound-and very minimally ambulatory at baseline.  Given her numerous other medical comorbidities-she has been told by her outpatient physicians that she is no longer a surgical candidate.  We will focus on PT/OT and other supportive care.  In the interim-continue with Lidoderm patches, Neurontin and duloxetine.  Obesity: Body mass index is 31.74 kg/m.   Pressure ulcer: Pressure Injury 11/20/20 Upper;Lower;Left Stage 3 -  Full thickness tissue loss. Subcutaneous fat may be visible but bone, tendon or muscle are NOT exposed. (Active)  11/20/20   Location:   Location Orientation: Upper;Lower;Left  Staging: Stage 3 -  Full thickness tissue loss. Subcutaneous fat may be visible but bone, tendon or muscle are NOT exposed.  Wound Description (Comments):   Present on Admission: Yes     Pressure Injury 11/20/20 Right;Lower;Upper Unstageable - Full thickness tissue loss in which the base of the injury is covered by slough (yellow, tan, gray, green or brown) and/or eschar (tan, brown or black) in the wound bed. (Active)  11/20/20   Location:   Location Orientation: Right;Lower;Upper  Staging: Unstageable -  Full thickness tissue loss in which the base of the injury is covered by  slough (yellow, tan, gray, green or brown) and/or eschar (tan, brown or black) in the wound bed.  Wound Description (Comments):   Present on Admission: Yes     Pressure Injury 11/21/20 Thigh Left;Posterior;Proximal Unstageable - Full thickness tissue loss in which the base of the injury is covered by slough (yellow, tan, gray, green or brown) and/or eschar (tan, brown or black) in the wound bed. (Active)  11/21/20 0800  Location: Thigh  Location Orientation: Left;Posterior;Proximal  Staging: Unstageable - Full thickness tissue loss in which the base of the injury is covered by slough (yellow, tan, gray, green or brown) and/or eschar (tan, brown or black) in the wound bed.  Wound Description (Comments):   Present on Admission: Yes     Pressure Injury 11/21/20 Buttocks Right Stage 2 -  Partial thickness loss of dermis presenting as a shallow open injury with a red, pink wound bed without slough. (Active)  11/21/20 0800  Location: Buttocks  Location Orientation: Right  Staging: Stage 2 -  Partial thickness loss of dermis presenting as a shallow open injury with a red, pink wound bed without slough.  Wound Description (Comments):   Present on Admission: Yes     Pressure Injury 11/21/20 Thigh Posterior;Proximal;Right Unstageable - Full thickness tissue loss in which the base of the injury is covered by slough (yellow, tan, gray, green or brown) and/or eschar (tan, brown or black) in the wound bed. (Active)  11/21/20 0800  Location: Thigh  Location Orientation: Posterior;Proximal;Right  Staging: Unstageable - Full thickness tissue loss in which the base of the injury is covered by slough (yellow, tan, gray, green or brown) and/or eschar (tan, brown or black) in the wound bed.  Wound Description (Comments):   Present on Admission: Yes   DVT prophylaxis: SCDs Code Status: FULL CODE  Family Communication:  Spouse-[phone]-unable to leave a voicemail on 8/17 Disposition Plan: SNF  Status is: Inpatient  Remains inpatient appropriate because:Unsafe d/c plan, IV treatments appropriate due to intensity of illness or inability to take PO, and Inpatient level of care appropriate due to severity of illness  Dispo: The patient is from: Home              Anticipated d/c is to:  TBD              Patient currently is not medically stable to d/c.   Difficult to place patient No  Consultants:  Nephrology   Procedures: None  Antimicrobials:  Anti-infectives (From admission, onward)    Start     Dose/Rate Route Frequency Ordered Stop   11/19/20 2057  cefTRIAXone (ROCEPHIN) 1 g in sodium chloride 0.9 % 100 mL IVPB        1 g 200 mL/hr over 30 Minutes Intravenous  Once 11/19/20 1847 11/19/20 2200        Objective: Vitals:   11/22/20 2340 11/23/20 0400 11/23/20 0803 11/23/20 1319  BP: 128/68 130/62 126/61 131/62  Pulse: 71 68 68 72  Resp: 20 20 18 18   Temp: 98.6 F (37 C) 98.4 F (36.9 C) 97.7 F (36.5 C) 97.9 F (36.6 C)  TempSrc: Oral Oral Oral Oral  SpO2: 96% 97% 100% (!) 89%  Weight:      Height:        Intake/Output Summary (Last 24 hours) at 11/23/2020 1326 Last data filed at 11/23/2020 0855 Gross per 24 hour  Intake 753.35 ml  Output 1100 ml  Net -346.65 ml    Filed Weights   11/19/20  1217 11/20/20 2327  Weight: 77.1 kg 89.2 kg   Examination: Gen Exam:Alert awake-not in any distress HEENT:atraumatic, normocephalic Chest: B/L clear to auscultation anteriorly CVS:S1S2 regular Abdomen:soft non tender, non distended Extremities:no edema Neurology: Able to just about left both lower legs off the bed (against gravity) this morning.  Easily able to move legs side-by-side.  Sensation appears intact. Skin: no rash    Data Reviewed: I have personally reviewed following labs and imaging studies  CBC: Recent Labs  Lab 11/19/20 1356 11/20/20 0645 11/21/20 1908  11/22/20 0019 11/23/20 0357  WBC 16.3* 14.3* 13.5* 13.8* 14.0*  NEUTROABS 14.2*  --  11.6* 11.6*  --   HGB 7.3* 7.7* 6.9* 7.8* 8.1*  HCT 22.9* 25.1* 22.0* 25.1* 25.7*  MCV 92.7 94.0 93.2 94.0 92.8  PLT 330 287 290 309 379    Basic Metabolic Panel: Recent Labs  Lab 11/19/20 1356 11/20/20 0645 11/21/20 0342 11/22/20 0019 11/23/20 0357  NA 140 142 142 143 143  K 3.1* 3.4* 2.9* 4.1 3.8  CL 102 103 104 107 105  CO2 22 22 23  20* 23  GLUCOSE 119* 105* 97 90 117*  BUN 185* 189* 187* 180* 169*  CREATININE 6.20* 5.80* 5.59* 5.06* 4.99*  CALCIUM 8.4* 8.8* 8.9 9.2 9.7  MG  --   --   --  2.1  --   PHOS  --   --  6.3* 5.7* 5.6*    GFR: Estimated Creatinine Clearance: 11 mL/min (A) (by C-G formula based on SCr of 4.99 mg/dL (H)). Liver Function Tests: Recent Labs  Lab 11/19/20 1356 11/21/20 0342 11/22/20 0019 11/23/20 0357  AST 40  --  22  --   ALT 37  --  21  --   ALKPHOS 78  --  54  --   BILITOT 0.6  --  1.4*  --   PROT 6.2*  --  6.6  --   ALBUMIN 2.0* 3.2* 3.6 3.1*    No results for input(s): LIPASE, AMYLASE in the last 168 hours. No results for input(s): AMMONIA in the last 168 hours. Coagulation Profile: No results for input(s): INR, PROTIME in the last 168 hours. Cardiac Enzymes: Recent Labs  Lab 11/20/20 1557  CKTOTAL 147    BNP (last 3 results) No results for input(s): PROBNP in the last 8760 hours. HbA1C: No results for input(s): HGBA1C in the last 72 hours. CBG: No results for input(s): GLUCAP in the last 168 hours. Lipid Profile: No results for input(s): CHOL, HDL, LDLCALC, TRIG, CHOLHDL, LDLDIRECT in the last 72 hours. Thyroid Function Tests: No results for input(s): TSH, T4TOTAL, FREET4, T3FREE, THYROIDAB in the last 72 hours. Anemia Panel: No results for input(s): VITAMINB12, FOLATE, FERRITIN, TIBC, IRON, RETICCTPCT in the last 72 hours.  Sepsis Labs: Recent Labs  Lab 11/19/20 1821  LATICACIDVEN 0.7     Recent Results (from the past 240  hour(s))  Resp Panel by RT-PCR (Flu A&B, Covid) Nasopharyngeal Swab     Status: None   Collection Time: 11/19/20  5:19 PM   Specimen: Nasopharyngeal Swab; Nasopharyngeal(NP) swabs in vial transport medium  Result Value Ref Range Status   SARS Coronavirus 2 by RT PCR NEGATIVE NEGATIVE Final    Comment: (NOTE) SARS-CoV-2 target nucleic acids are NOT DETECTED.  The SARS-CoV-2 RNA is generally detectable in upper respiratory specimens during the acute phase of infection. The lowest concentration of SARS-CoV-2 viral copies this assay can detect is 138 copies/mL. A negative result does not preclude SARS-Cov-2 infection  and should not be used as the sole basis for treatment or other patient management decisions. A negative result may occur with  improper specimen collection/handling, submission of specimen other than nasopharyngeal swab, presence of viral mutation(s) within the areas targeted by this assay, and inadequate number of viral copies(<138 copies/mL). A negative result must be combined with clinical observations, patient history, and epidemiological information. The expected result is Negative.  Fact Sheet for Patients:  EntrepreneurPulse.com.au  Fact Sheet for Healthcare Providers:  IncredibleEmployment.be  This test is no t yet approved or cleared by the [place] FDA and  has been authorized for detection and/or diagnosis of SARS-CoV-2 by FDA under an Emergency Use Authorization (EUA). This EUA will remain  in effect (meaning this test can be used) for the duration of the COVID-19 declaration under Section 564(b)(1) of the Act, 21 U.S.C.section 360bbb-3(b)(1), unless the authorization is terminated  or revoked sooner.       Influenza A by PCR NEGATIVE NEGATIVE Final   Influenza B by PCR NEGATIVE NEGATIVE Final    Comment: (NOTE) The Xpert Xpress SARS-CoV-2/FLU/RSV plus assay is intended as an aid in the diagnosis of influenza from  Nasopharyngeal swab specimens and should not be used as a sole basis for treatment. Nasal washings and aspirates are unacceptable for Xpert Xpress SARS-CoV-2/FLU/RSV testing.  Fact Sheet for Patients: EntrepreneurPulse.com.au  Fact Sheet for Healthcare Providers: IncredibleEmployment.be  This test is not yet approved or cleared by the [place] FDA and has been authorized for detection and/or diagnosis of SARS-CoV-2 by FDA under an Emergency Use Authorization (EUA). This EUA will remain in effect (meaning this test can be used) for the duration of the COVID-19 declaration under Section 564(b)(1) of the Act, 21 U.S.C. section 360bbb-3(b)(1), unless the authorization is terminated or revoked.  Performed at [facility], [place] [address], [place], [place] [address]   Gastrointestinal Panel by PCR , Stool     Status: None   Collection Time: 11/20/20  6:48 PM   Specimen: Stool  Result Value Ref Range Status   Campylobacter species NOT DETECTED NOT DETECTED Final   Plesimonas shigelloides NOT DETECTED NOT DETECTED Final   Salmonella species NOT DETECTED NOT DETECTED Final   Yersinia enterocolitica NOT DETECTED NOT DETECTED Final   Vibrio species NOT DETECTED NOT DETECTED Final   Vibrio cholerae NOT DETECTED NOT DETECTED Final   Enteroaggregative E coli (EAEC) NOT DETECTED NOT DETECTED Final   Enteropathogenic E coli (EPEC) NOT DETECTED NOT DETECTED Final   Enterotoxigenic E coli (ETEC) NOT DETECTED NOT DETECTED Final   Shiga like toxin producing E coli (STEC) NOT DETECTED NOT DETECTED Final   Shigella/Enteroinvasive E coli (EIEC) NOT DETECTED NOT DETECTED Final   Cryptosporidium NOT DETECTED NOT DETECTED Final   Cyclospora cayetanensis NOT DETECTED NOT DETECTED Final   Entamoeba histolytica NOT DETECTED NOT DETECTED Final   Giardia lamblia NOT DETECTED NOT DETECTED Final   Adenovirus F40/41 NOT DETECTED NOT DETECTED Final   Astrovirus NOT  DETECTED NOT DETECTED Final   Norovirus GI/GII NOT DETECTED NOT DETECTED Final   Rotavirus A NOT DETECTED NOT DETECTED Final   Sapovirus (I, II, IV, and V) NOT DETECTED NOT DETECTED Final    Comment: Performed at [facility], [address] [place], [place] [address]  C Difficile Quick Screen w PCR reflex     Status: None   Collection Time: 11/20/20  6:48 PM   Specimen: STOOL  Result Value Ref Range Status  C Diff antigen NEGATIVE NEGATIVE Final   C Diff toxin NEGATIVE NEGATIVE Final   C Diff interpretation No C. difficile detected.  Final    Comment: Performed at [facility], [place] [address] [place], [place] [address]    RN Pressure Injury Documentation: Pressure Injury 11/20/20 Upper;Lower;Left Stage 3 -  Full thickness tissue loss. Subcutaneous fat may be visible but bone, tendon or muscle are NOT exposed. (Active)  11/20/20   Location:   Location Orientation: Upper;Lower;Left  Staging: Stage 3 -  Full thickness tissue loss. Subcutaneous fat may be visible but bone, tendon or muscle are NOT exposed.  Wound Description (Comments):   Present on Admission: Yes     Pressure Injury 11/20/20 Right;Lower;Upper Unstageable - Full thickness tissue loss in which the base of the injury is covered by slough (yellow, tan, gray, green or brown) and/or eschar (tan, brown or black) in the wound bed. (Active)  11/20/20   Location:   Location Orientation: Right;Lower;Upper  Staging: Unstageable - Full thickness tissue loss in which the base of the injury is covered by slough (yellow, tan, gray, green or brown) and/or eschar (tan, brown or black) in the wound bed.  Wound Description (Comments):   Present on Admission: Yes     Pressure Injury 11/21/20 Thigh Left;Posterior;Proximal Unstageable - Full thickness tissue loss in which the base of the injury is covered by slough (yellow, tan, gray, green or brown) and/or eschar (tan, brown or black) in the wound bed. (Active)  11/21/20  0800  Location: Thigh  Location Orientation: Left;Posterior;Proximal  Staging: Unstageable - Full thickness tissue loss in which the base of the injury is covered by slough (yellow, tan, gray, green or brown) and/or eschar (tan, brown or black) in the wound bed.  Wound Description (Comments):   Present on Admission: Yes     Pressure Injury 11/21/20 Buttocks Right Stage 2 -  Partial thickness loss of dermis presenting as a shallow open injury with a red, pink wound bed without slough. (Active)  11/21/20 0800  Location: Buttocks  Location Orientation: Right  Staging: Stage 2 -  Partial thickness loss of dermis presenting as a shallow open injury with a red, pink wound bed without slough.  Wound Description (Comments):   Present on Admission: Yes     Pressure Injury 11/21/20 Thigh Posterior;Proximal;Right Unstageable - Full thickness tissue loss in which the base of the injury is covered by slough (yellow, tan, gray, green or brown) and/or eschar (tan, brown or black) in the wound bed. (Active)  11/21/20 0800  Location: Thigh  Location Orientation: Posterior;Proximal;Right  Staging: Unstageable - Full thickness tissue loss in which the base of the injury is covered by slough (yellow, tan, gray, green or brown) and/or eschar (tan, brown or black) in the wound bed.  Wound Description (Comments):   Present on Admission: Yes     Estimated body mass index is 31.74 kg/m as calculated from the following:   Height as of this encounter: 5\' 6"  (1.676 m).   Weight as of this encounter: 89.2 kg.  Malnutrition Type:   Malnutrition Characteristics:   Nutrition Interventions:    Radiology Studies: No results found.  Scheduled Meds:  sodium chloride   Intravenous Once   amLODipine  5 mg Oral QHS   aspirin EC  81 mg Oral Daily   calcitRIOL  0.25 mcg Oral Once per day on Mon Fri   capsicum   Topical BID   carvedilol  25 mg Oral BID  WC   collagenase   Topical Daily   [START ON 11/28/2020]  darbepoetin (ARANESP) injection - NON-DIALYSIS  100 mcg Subcutaneous Q Tue-1800   DULoxetine  30 mg Oral Daily   gabapentin  100 mg Oral QHS   heparin injection (subcutaneous)  5,000 Units Subcutaneous Q12H   multivitamin with minerals  1 tablet Oral Daily   pantoprazole  40 mg Oral Q0600   Continuous Infusions:  sodium chloride 50 mL/hr at 11/23/20 1149    LOS: 4 days   Oren Binet, DO [facility] PAGER is on AMION  If 7PM-7AM, please contact night-coverage [email or web address]

## 2020-11-23 NOTE — Progress Notes (Addendum)
Daily Rounding Note  11/23/2020, 10:54 AM  LOS: 4 days   SUBJECTIVE:   Chief complaint:   Altered bowel habits.  FOBT positive.  Improved nausea and vomiting.  Dry heaves yesterday, nonbloody, small-volume emesis today about 10 minutes after meds.  1 soft brown stool yesterday.  No visible blood in the stool.  No significant abdominal pain.  Overall feels better.  OBJECTIVE:         Vital signs in last 24 hours:    Temp:  [97.6 F (36.4 C)-99.2 F (37.3 C)] 97.7 F (36.5 C) (08/18 0803) Pulse Rate:  [68-77] 68 (08/18 0803) Resp:  [18-20] 18 (08/18 0803) BP: (123-139)/(61-68) 126/61 (08/18 0803) SpO2:  [93 %-100 %] 100 % (08/18 0803) Last BM Date: 11/22/20 (per pt) Filed Weights   11/19/20 1217 11/20/20 2327  Weight: 77.1 kg 89.2 kg   General: Chronically ill looking, comfortable, obese. Heart: RRR. Chest: Soft decreased breath sounds.  No labored breathing but taking shallow breaths.  No cough. Abdomen: Obese, soft.  Not tender.  Bowel sounds active. Extremities: Marked lower extremity edema and changes of skin look like stasis dermatitis. Neuro/Psych: Alert.  Oriented x3.  No tremors.  Intake/Output from previous day: 08/17 0701 - 08/18 0700 In: 453.4 [P.O.:240; IV Piggyback:213.4] Out: 1100 [Urine:1100]  Intake/Output this shift: Total I/O In: 300 [P.O.:300] Out: -   Lab Results: Recent Labs    11/21/20 1908 11/22/20 0019 11/23/20 0357  WBC 13.5* 13.8* 14.0*  HGB 6.9* 7.8* 8.1*  HCT 22.0* 25.1* 25.7*  PLT 290 309 337   BMET Recent Labs    11/21/20 0342 11/22/20 0019 11/23/20 0357  NA 142 143 143  K 2.9* 4.1 3.8  CL 104 107 105  CO2 23 20* 23  GLUCOSE 97 90 117*  BUN 187* 180* 169*  CREATININE 5.59* 5.06* 4.99*  CALCIUM 8.9 9.2 9.7   LFT Recent Labs    11/21/20 0342 11/22/20 0019 11/23/20 0357  PROT  --  6.6  --   ALBUMIN 3.2* 3.6 3.1*  AST  --  22  --   ALT  --  21  --    ALKPHOS  --  54  --   BILITOT  --  1.4*  --    PT/INR No results for input(s): LABPROT, INR in the last 72 hours. Hepatitis Panel No results for input(s): HEPBSAG, HCVAB, HEPAIGM, HEPBIGM in the last 72 hours.  Studies/Results: DG Abd Portable 1V  Result Date: 11/21/2020 CLINICAL DATA:  Diarrhea. EXAM: PORTABLE ABDOMEN - 1 VIEW COMPARISON:  05/31/2017 FINDINGS: The lung bases are not included. The bowel gas pattern is unremarkable. No findings for obstruction. The soft tissue shadows are grossly maintained. No worrisome calcifications. The bony structures are unremarkable. IMPRESSION: Unremarkable abdominal radiographs. Electronically Signed   By: Marijo Sanes M.D.   On: 11/21/2020 14:49    ASSESMENT:   Change of bowel habits from baseline constipation to 1 or 2 soft stools daily.  FOBT positive without overt bleeding and large hemorrhoid w limited, scant bleeding during DRE couple of days ago.  No bleeding noted even with the addition of DVT prophylaxis Heparin.  Adenomatous polyps in past.  Last colonoscopy 2013 with findings of adenomatous polyp, diverticulosis, hemorrhoids.  Stool C. difficile antigen and toxin negative.  Stool pathogen PCR also negative.  KUB unremarkable.    Anemia of chronic kidney disease.  Hgb 6.9 >> 2 PRBC >> 8.1.   On  Aranesp.  Nausea and vomiting.  This in setting of azotemia, AKI.  Overall nausea improved but incident of nonbloody emesis this morning after taking her meds and dry heaves yesterday.  ?gastroparesis, GERD?.  High risk for sedation required for endoscopic procedures.  AKI with baseline CKD 4.  Improved BUN/Creat.  Renal managing.  Chronic, hypoxic respiratory failure on home oxygen.  Aggressive physical decline and wheelchair dependency.  Advanced degenerative knee disease playing an important role.  Also has pain from cervical spine disease/radiculopathy.  Bioprosthetic AVR, repair of ascending aortic aneurysm 2019.  On 81 mg aspirin  daily.   PLAN     Anusol HC cream to hemorrhoids prn for BPR.   Did not order this but can be ordered if needed.     Agree with continuing renal diet.  Patient needs assistance with feeding as it is hard for her to hold utensils.     Continue Protonix 40 mg daily.  Would continue this or equivalent at discharge.    Azucena Freed  11/23/2020, 10:54 AM Phone [phone]    Attending Physician's Attestation   I have taken an interval history, reviewed the chart and examined the patient.   Patient continues to have issues with nausea and poor appetite but has had stable hemoglobin over the past several days without overt bleeding. I do not think an endoscopy is going to be high yield for this patient at this point and may pose more risk than benefit..  I think empiric trial of Reglan and/or assessing for gastroparesis with a gastric emptying study could be reasonable if the patient's symptoms persist after her azotemia improves.  GI will sign off for now, please reconsult if her clinical status changes or further questions arise  I agree with the Advanced Practitioner's note, impression, and recommendations with updates and my documentation above.   Dustin Flock, MD [facility]

## 2020-11-23 NOTE — Progress Notes (Addendum)
Patient ID: [name], female   DOB: 10-04-1945, 75 y.o.   MRN: [id number] [facility] Progress Note   Assessment/ Plan:   1.  Acute kidney injury on chronic kidney disease stage IV: I suspect she has had progression of her underlying chronic kidney disease and may establish her baseline.  Current acute injury appears to be hemodynamically mediated and improving sluggishly.  She does not have acute electrolyte abnormalities and although she has significant azotemia, the only uremic symptom attributable is her nausea/vomiting for which medical management is being undertaken.  Oral intake still not ideal and I will continue maintenance intravenous fluids with normal saline at a gentle rate of 50 cc/h until she has reliable oral intake while monitoring for volume overload. 2.  Hypokalemia: Secondary to poor intake/diarrhea-corrected with oral replacement. 3.  Chronic hypoxic respiratory failure: She appears to be at baseline with regards to her respiratory status on oxygen supplementation at 2 L/min via nasal cannula. 4.  Anemia of chronic kidney disease/chronic illness: With iron deficiency and associated anemia, started on iron supplementation/ESA.  Seen by gastroenterology with concerns of positive FOBT (entirely possible that elevated BUN may in part be from GI bleed). 5.  Chronic cervical spine radiculopathy with chronic back pain with acute on chronic ambulation dysfunction: Suspect that with her limitations and current social support, she will unlikely be able to return home and will need placement at a skilled nursing facility.  Subjective:   Complains of nausea and an episode of vomiting earlier today.  Appetite remains poor and she still has significant weakness of the lower extremities.   Objective:   BP 126/61 (BP Location: Left Arm)   Pulse 68   Temp 97.7 F (36.5 C) (Oral)   Resp 18   Ht 5\' 6"  (1.676 m)   Wt 89.2 kg   SpO2 100%   BMI 31.74 kg/m   Intake/Output  Summary (Last 24 hours) at 11/23/2020 1120 Last data filed at 11/23/2020 0855 Gross per 24 hour  Intake 753.35 ml  Output 1100 ml  Net -346.65 ml   Weight change:   Physical Exam: Gen: Comfortably resting in bed, awake and alert CVS: Pulse regular rhythm, normal rate, S1 and S2 normal Resp: Anteriorly clear to auscultation, no rales/rhonchi Abd: Soft, moderately distended/obese, some discomfort/tenderness over lower quadrants Ext: Trace lower extremity edema with dry skin and long/unkempt toenails  Imaging: DG Abd Portable 1V  Result Date: 11/21/2020 CLINICAL DATA:  Diarrhea. EXAM: PORTABLE ABDOMEN - 1 VIEW COMPARISON:  05/31/2017 FINDINGS: The lung bases are not included. The bowel gas pattern is unremarkable. No findings for obstruction. The soft tissue shadows are grossly maintained. No worrisome calcifications. The bony structures are unremarkable. IMPRESSION: Unremarkable abdominal radiographs. Electronically Signed   By: Marijo Sanes M.D.   On: 11/21/2020 14:49    Labs: BMET Recent Labs  Lab 11/19/20 1356 11/20/20 0645 11/21/20 0342 11/22/20 0019 11/23/20 0357  NA 140 142 142 143 143  K 3.1* 3.4* 2.9* 4.1 3.8  CL 102 103 104 107 105  CO2 22 22 23  20* 23  GLUCOSE 119* 105* 97 90 117*  BUN 185* 189* 187* 180* 169*  CREATININE 6.20* 5.80* 5.59* 5.06* 4.99*  CALCIUM 8.4* 8.8* 8.9 9.2 9.7  PHOS  --   --  6.3* 5.7* 5.6*   CBC Recent Labs  Lab 11/19/20 1356 11/20/20 0645 11/21/20 1908 11/22/20 0019 11/23/20 0357  WBC 16.3* 14.3* 13.5* 13.8* 14.0*  NEUTROABS 14.2*  --  11.6* 11.6*  --  HGB 7.3* 7.7* 6.9* 7.8* 8.1*  HCT 22.9* 25.1* 22.0* 25.1* 25.7*  MCV 92.7 94.0 93.2 94.0 92.8  PLT 330 287 290 309 337    Medications:     sodium chloride   Intravenous Once   amLODipine  5 mg Oral QHS   aspirin EC  81 mg Oral Daily   calcitRIOL  0.25 mcg Oral Once per day on Mon Fri   capsicum   Topical BID   carvedilol  25 mg Oral BID WC   collagenase   Topical Daily    [START ON 11/28/2020] darbepoetin (ARANESP) injection - NON-DIALYSIS  100 mcg Subcutaneous Q Tue-1800   DULoxetine  30 mg Oral Daily   gabapentin  100 mg Oral QHS   heparin injection (subcutaneous)  5,000 Units Subcutaneous Q12H   multivitamin with minerals  1 tablet Oral Daily   pantoprazole  40 mg Oral Q0600   Elmarie Shiley, MD 11/23/2020, 11:20 AM

## 2020-11-24 LAB — RENAL FUNCTION PANEL
Albumin: 3.1 g/dL — ABNORMAL LOW (ref 3.5–5.0)
Anion gap: 12 (ref 5–15)
BUN: 166 mg/dL — ABNORMAL HIGH (ref 8–23)
CO2: 22 mmol/L (ref 22–32)
Calcium: 9.4 mg/dL (ref 8.9–10.3)
Chloride: 110 mmol/L (ref 98–111)
Creatinine, Ser: 4.85 mg/dL — ABNORMAL HIGH (ref 0.44–1.00)
GFR, Estimated: 9 mL/min — ABNORMAL LOW (ref >60)
Glucose, Bld: 93 mg/dL (ref 70–99)
Phosphorus: 5.5 mg/dL — ABNORMAL HIGH (ref 2.5–4.6)
Potassium: 3.7 mmol/L (ref 3.5–5.1)
Sodium: 144 mmol/L (ref 135–145)

## 2020-11-24 LAB — CBC
HCT: 27 % — ABNORMAL LOW (ref 36.0–46.0)
Hemoglobin: 8.4 g/dL — ABNORMAL LOW (ref 12.0–15.0)
MCH: 29.3 pg (ref 26.0–34.0)
MCHC: 31.1 g/dL (ref 30.0–36.0)
MCV: 94.1 fL (ref 80.0–100.0)
Platelets: 359 10*3/uL (ref 150–400)
RBC: 2.87 MIL/uL — ABNORMAL LOW (ref 3.87–5.11)
RDW: 16 % — ABNORMAL HIGH (ref 11.5–15.5)
WBC: 16.1 10*3/uL — ABNORMAL HIGH (ref 4.0–10.5)
nRBC: 0 % (ref 0.0–0.2)

## 2020-11-24 NOTE — Progress Notes (Signed)
Physical Therapy Treatment Patient Details Name: [name] MRN: [id number] DOB: [birth year] Today's Date: 11/24/2020    History of Present Illness Pt is a 75yo female presenting to [facility] ED on 8/14 with neck and bilteral lower extremity pain secondary to a fall at home while using [employer]. Xray revealed chronic osteoarthritis of both knees present in previous imaging. Current diagnosis of acute kidney injury. Pt on 2-3L of O2 24/7 at home. PMH: anemia, CKD4, CHF, HTN, gout, aortic stenosis.    PT Comments    Pt received in bed with primary c/o nausea. She reports BLE pain persists but it has greatly improved. Pt declining OOB but agreeable to LE exercises in supine. Pt repositioned in bed requiring total assist using bed pad and bed in Trendelenburg position.    Follow Up Recommendations  SNF;Supervision/Assistance - 24 hour     Equipment Recommendations  Rolling walker with 5" wheels;3in1 (PT);Wheelchair (measurements PT);Wheelchair cushion (measurements PT);Hospital bed (bariatric 3n1)    Recommendations for Other Services       Precautions / Restrictions Precautions Precautions: Fall Precaution Comments: Fell at home while on [facility]    Mobility  Bed Mobility Overal bed mobility: Needs Assistance             General bed mobility comments: total assist for repositioning in bed. Pt declining transition to EOB due to nausea.    Transfers                    Ambulation/Gait                 Stairs             Wheelchair Mobility    Modified Rankin (Stroke Patients Only)       Balance                                            Cognition Arousal/Alertness: Awake/alert Behavior During Therapy: WFL for tasks assessed/performed Overall Cognitive Status: No family/caregiver present to determine baseline cognitive functioning                                 General Comments: Calm and cooperative. Following commands.       Exercises General Exercises - Lower Extremity Ankle Circles/Pumps: AROM;AAROM;Both;5 reps;Supine Heel Slides: AAROM;Right;Left;5 reps;Supine Hip ABduction/ADduction: AAROM;Right;Left;5 reps;Supine Straight Leg Raises: AAROM;Right;Left;5 reps;Supine    General Comments        Pertinent Vitals/Pain Pain Assessment: Faces Faces Pain Scale: Hurts little more Pain Location: BLE Pain Descriptors / Indicators: Grimacing;Guarding Pain Intervention(s): Limited activity within patient's tolerance;Monitored during session;Repositioned    Home Living                      Prior Function            PT Goals (current goals can now be found in the care plan section) Acute Rehab PT Goals Patient Stated Goal: not stated Progress towards PT goals: Progressing toward goals    Frequency    Min 2X/week      PT Plan Current plan remains appropriate    Co-evaluation              AM-PAC PT "6 Clicks" Mobility   Outcome Measure  Help needed turning from your back to your side  while in a flat bed without using bedrails?: Total Help needed moving from lying on your back to sitting on the side of a flat bed without using bedrails?: Total Help needed moving to and from a bed to a chair (including a wheelchair)?: Total Help needed standing up from a chair using your arms (e.g., wheelchair or bedside chair)?: Total Help needed to walk in hospital room?: Total Help needed climbing 3-5 steps with a railing? : Total 6 Click Score: 6    End of Session Equipment Utilized During Treatment: Oxygen (2L) Activity Tolerance: Treatment limited secondary to medical complications (Comment) (nausea) Patient left: in bed;with call bell/phone within reach Nurse Communication: Mobility status PT Visit Diagnosis: Muscle weakness (generalized) (M62.81);History of falling (Z91.81);Pain;Other abnormalities of gait and mobility (R26.89) Pain - part of body: Knee;Leg     Time:  9826-4158 PT Time Calculation (min) (ACUTE ONLY): 11 min  Charges:  $Therapeutic Exercise: 8-22 mins                     Lorrin Goodell, PT  Office # [phone] Pager [phone]    Lorriane Shire 11/24/2020, 12:25 PM

## 2020-11-24 NOTE — Progress Notes (Signed)
[facility] NEPHROLOGY PROGRESS NOTE  Assessment/ Plan:  #  Acute kidney injury on chronic kidney disease stage IV, nonoliguric: Baseline creatinine level seems to be around 2.4-2.6 and was followed by Dr.Upton.  AKI presumably hemodynamically mediated in the setting of diarrhea and may have some progressive underlying CKD. BUN very high concern for possible bleeding.  Decent urine output.  She has minimal uremic symptoms secondary nausea without vomiting and reported feeling much better today.  She still has poor oral intake therefore getting gentle IV hydration.  Electrolytes are acceptable. Patient is hesitant to start dialysis and wants to wait. No urgent indication for dialysis.  Watch for renal recovery through the weekend.    # Hypokalemia: Secondary to poor intake/diarrhea-corrected with oral replacement.  # Chronic hypoxic respiratory failure: She appears to be at baseline with regards to her respiratory status on oxygen supplementation at 2 L/min via nasal cannula.  # Anemia of chronic kidney disease/chronic illness: With iron deficiency and associated anemia, started on iron supplementation/ESA.  Seen by gastroenterology with concerns of positive FOBT (entirely possible that elevated BUN may in part be from GI bleed).  #  Chronic cervical spine radiculopathy with chronic back pain with acute on chronic ambulation dysfunction: Suspect that with her limitations and current social support, she will unlikely be able to return home and will need placement at a skilled nursing facility.  Discussed with the primary team.   Subjective: Seen and examined at bedside.  Urine output recorded around 1000 cc.  Reports some nausea but no vomiting, chest pain or shortness of breath.  She feels better than yesterday.  No new event. Objective Vital signs in last 24 hours: Vitals:   11/24/20 0000 11/24/20 0312 11/24/20 0744 11/24/20 1242  BP: 132/63 133/63 125/64 128/64  Pulse: 77 74 75  75  Resp: (!) 21 (!) 22 (!) 23 (!) 21  Temp: 97.7 F (36.5 C) 98.6 F (37 C) 98.7 F (37.1 C) 97.7 F (36.5 C)  TempSrc: Oral Oral Oral Oral  SpO2: 98% 98% 98% 99%  Weight:      Height:       Weight change:   Intake/Output Summary (Last 24 hours) at 11/24/2020 1349 Last data filed at 11/24/2020 0040 Gross per 24 hour  Intake 300 ml  Output 1000 ml  Net -700 ml       Labs: Basic Metabolic Panel: Recent Labs  Lab 11/22/20 0019 11/23/20 0357 11/24/20 0052  NA 143 143 144  K 4.1 3.8 3.7  CL 107 105 110  CO2 20* 23 22  GLUCOSE 90 117* 93  BUN 180* 169* 166*  CREATININE 5.06* 4.99* 4.85*  CALCIUM 9.2 9.7 9.4  PHOS 5.7* 5.6* 5.5*   Liver Function Tests: Recent Labs  Lab 11/19/20 1356 11/21/20 0342 11/22/20 0019 11/23/20 0357 11/24/20 0052  AST 40  --  22  --   --   ALT 37  --  21  --   --   ALKPHOS 78  --  54  --   --   BILITOT 0.6  --  1.4*  --   --   PROT 6.2*  --  6.6  --   --   ALBUMIN 2.0*   < > 3.6 3.1* 3.1*   < > = values in this interval not displayed.   No results for input(s): LIPASE, AMYLASE in the last 168 hours. No results for input(s): AMMONIA in the last 168 hours. CBC: Recent Labs  Lab  11/19/20 1356 11/20/20 0645 11/21/20 1908 11/22/20 0019 11/23/20 0357 11/24/20 0052  WBC 16.3* 14.3* 13.5* 13.8* 14.0* 16.1*  NEUTROABS 14.2*  --  11.6* 11.6*  --   --   HGB 7.3* 7.7* 6.9* 7.8* 8.1* 8.4*  HCT 22.9* 25.1* 22.0* 25.1* 25.7* 27.0*  MCV 92.7 94.0 93.2 94.0 92.8 94.1  PLT 330 287 290 309 337 359   Cardiac Enzymes: Recent Labs  Lab 11/20/20 1557  CKTOTAL 147   CBG: No results for input(s): GLUCAP in the last 168 hours.  Iron Studies: No results for input(s): IRON, TIBC, TRANSFERRIN, FERRITIN in the last 72 hours. Studies/Results: No results found.  Medications: Infusions:  sodium chloride 50 mL/hr at 11/24/20 0846    Scheduled Medications:  sodium chloride   Intravenous Once   amLODipine  5 mg Oral QHS   aspirin EC  81 mg  Oral Daily   calcitRIOL  0.25 mcg Oral Once per day on Mon Fri   capsicum   Topical BID   carvedilol  25 mg Oral BID WC   collagenase   Topical Daily   [START ON 11/28/2020] darbepoetin (ARANESP) injection - NON-DIALYSIS  100 mcg Subcutaneous Q Tue-1800   DULoxetine  30 mg Oral Daily   gabapentin  100 mg Oral QHS   heparin injection (subcutaneous)  5,000 Units Subcutaneous Q12H   multivitamin with minerals  1 tablet Oral Daily   pantoprazole  40 mg Oral Q0600    have reviewed scheduled and prn medications.  Physical Exam: General:NAD, comfortable Heart:RRR, s1s2 nl Lungs: Distant breath sound, no wheezing or crackle appreciated Abdomen:soft, Non-tender, non-distended Extremities:No LE edema Neurology: Alert, awake and following commands, no asterixis  [name] 11/24/2020,1:49 PM  LOS: 5 days

## 2020-11-24 NOTE — Progress Notes (Signed)
PROGRESS NOTE    [name]  POE:[id number] DOB: 1945/05/21 DOA: 11/19/2020 PCP: Hoyt Koch, MD   Brief Narrative:  75 y.o. female with medical history significant of pulmonary HTN; HTN; stage 4 CKD; s/p bioprosthetic AVR with AAA repair (05/2017)HFpEF,Chronic hypoxic respiratory failure on 2 L-3 L at all times, Severe OA, chronic lower extremity weakness-barely ambulatory at home-presented with worsening weakness/diarrhea-she was found to have AKI and subsequently admitted to the hospitalist service.  See below for further details.  Subjective:  Some mild nausea but no vomiting.  Diarrhea has resolved.  Thinks that she is slowly getting more strength in her lower legs and feels that she is very close to her baseline.  Assessment & Plan: AKI on CKD stage IV: AKI likely hemodynamically mediated-in the setting of diarrhea and diuretic use.  Creatinine slowly improving-BUN remains significantly elevated-she continues to have some mild nausea.  Continue to avoid nephrotoxic agents-remains on IVF-nephrology following.  Await further recommendations.   Normocytic anemia: Hemoglobin stable-no overt GI bleeding apparent even though she is FOBT positive.  Continue to follow CBC.  Suspect anemia is probably due to acute illness superimposed on anemia of CKD-required 2 units of PRBC so far.  Aranesp/IV iron defer to nephrology.  Diarrhea: Significantly improved with supportive care.  C. difficile studies and GI pathogen panel was negative.  Severe OA with chronic bilateral lower extremity weakness/ambulatory dysfunction: Per patient-she was very minimally ambulatory at baseline (per H&P mostly wheelchair-bound-but just about able to transfer from bed to wheelchair)-suspect that lower extremity weakness is mostly chronic-and due to OA/degenerative spine disease/spinal stenosis.  Per patient-due to her significant comorbid medical conditions-her outpatient cardiologist has recommended against any  surgical procedures.  She apparently follows with outpatient orthopedics for intra-articular steroid injections.  Plan is to continue supportive care-continue PT/OT-with overall improvement in her renal function and improvement in her debility/weakness due to renal failure-hopefully her lower extremity weakness will improve somewhat.  Plans are for SNF on discharge  HTN: BP stable-continue amlodipine and Coreg  HFpEF: Volume status stable-avoid diuretics given AKI.  History of severe AS-s/p aortic valve replacement with bioprosthetic valve   History of repair of ascending aortic aneurysm  Chronic hypoxic respiratory failure: On 2-3 L of oxygen at home  Obesity: Body mass index is 31.74 kg/m.   Pressure ulcer: Pressure Injury 11/20/20 Upper;Lower;Left Stage 3 -  Full thickness tissue loss. Subcutaneous fat may be visible but bone, tendon or muscle are NOT exposed. (Active)  11/20/20   Location:   Location Orientation: Upper;Lower;Left  Staging: Stage 3 -  Full thickness tissue loss. Subcutaneous fat may be visible but bone, tendon or muscle are NOT exposed.  Wound Description (Comments):   Present on Admission: Yes     Pressure Injury 11/20/20 Right;Lower;Upper Unstageable - Full thickness tissue loss in which the base of the injury is covered by slough (yellow, tan, gray, green or brown) and/or eschar (tan, brown or black) in the wound bed. (Active)  11/20/20   Location:   Location Orientation: Right;Lower;Upper  Staging: Unstageable - Full thickness tissue loss in which the base of the injury is covered by slough (yellow, tan, gray, green or brown) and/or eschar (tan, brown or black) in the wound bed.  Wound Description (Comments):   Present on Admission: Yes     Pressure Injury 11/21/20 Thigh Left;Posterior;Proximal Unstageable - Full thickness tissue loss in which the base of the injury is covered by slough (yellow, tan, gray, green or brown) and/or eschar (tan,  brown or black) in  the wound bed. (Active)  11/21/20 0800  Location: Thigh  Location Orientation: Left;Posterior;Proximal  Staging: Unstageable - Full thickness tissue loss in which the base of the injury is covered by slough (yellow, tan, gray, green or brown) and/or eschar (tan, brown or black) in the wound bed.  Wound Description (Comments):   Present on Admission: Yes     Pressure Injury 11/21/20 Buttocks Right Stage 2 -  Partial thickness loss of dermis presenting as a shallow open injury with a red, pink wound bed without slough. (Active)  11/21/20 0800  Location: Buttocks  Location Orientation: Right  Staging: Stage 2 -  Partial thickness loss of dermis presenting as a shallow open injury with a red, pink wound bed without slough.  Wound Description (Comments):   Present on Admission: Yes     Pressure Injury 11/21/20 Thigh Posterior;Proximal;Right Unstageable - Full thickness tissue loss in which the base of the injury is covered by slough (yellow, tan, gray, green or brown) and/or eschar (tan, brown or black) in the wound bed. (Active)  11/21/20 0800  Location: Thigh  Location Orientation: Posterior;Proximal;Right  Staging: Unstageable - Full thickness tissue loss in which the base of the injury is covered by slough (yellow, tan, gray, green or brown) and/or eschar (tan, brown or black) in the wound bed.  Wound Description (Comments):   Present on Admission: Yes   DVT prophylaxis: SCDs Code Status: FULL CODE  Family Communication: Spouse-Charles-[phone]-unable to leave a voicemail on 8/19 Disposition Plan: SNF  Status is: Inpatient  Remains inpatient appropriate because:Unsafe d/c plan, IV treatments appropriate due to intensity of illness or inability to take PO, and Inpatient level of care appropriate due to severity of illness  Dispo: The patient is from: Home              Anticipated d/c is to:  TBD              Patient currently is not medically stable to d/c.   Difficult to place  patient No  Consultants:  Nephrology   Procedures: None  Antimicrobials:  Anti-infectives (From admission, onward)    Start     Dose/Rate Route Frequency Ordered Stop   11/19/20 2057  cefTRIAXone (ROCEPHIN) 1 g in sodium chloride 0.9 % 100 mL IVPB        1 g 200 mL/hr over 30 Minutes Intravenous  Once 11/19/20 1847 11/19/20 2200        Objective: Vitals:   11/23/20 1941 11/24/20 0000 11/24/20 0312 11/24/20 0744  BP: 127/74 132/63 133/63 125/64  Pulse: 76 77 74 75  Resp: 20 (!) 21 (!) 22 (!) 23  Temp: 98.4 F (36.9 C) 97.7 F (36.5 C) 98.6 F (37 C) 98.7 F (37.1 C)  TempSrc: Oral Oral Oral Oral  SpO2: 97% 98% 98% 98%  Weight:      Height:        Intake/Output Summary (Last 24 hours) at 11/24/2020 1103 Last data filed at 11/24/2020 0040 Gross per 24 hour  Intake 300 ml  Output 1000 ml  Net -700 ml    Filed Weights   11/19/20 1217 11/20/20 2327  Weight: 77.1 kg 89.2 kg   Examination: Gen Exam:Alert awake-not in any distress HEENT:atraumatic, normocephalic Chest: B/L clear to auscultation anteriorly CVS:S1S2 regular Abdomen:soft non tender, non distended Extremities:no edema Neurology: More fluently moving her leg side-to-side-just about able to lift the bed of the bed (against gravity) ( Skin: no rash  Data Reviewed: I have personally reviewed following labs and imaging studies  CBC: Recent Labs  Lab 11/19/20 1356 11/20/20 0645 11/21/20 1908 11/22/20 0019 11/23/20 0357 11/24/20 0052  WBC 16.3* 14.3* 13.5* 13.8* 14.0* 16.1*  NEUTROABS 14.2*  --  11.6* 11.6*  --   --   HGB 7.3* 7.7* 6.9* 7.8* 8.1* 8.4*  HCT 22.9* 25.1* 22.0* 25.1* 25.7* 27.0*  MCV 92.7 94.0 93.2 94.0 92.8 94.1  PLT 330 287 290 309 337 423    Basic Metabolic Panel: Recent Labs  Lab 11/20/20 0645 11/21/20 0342 11/22/20 0019 11/23/20 0357 11/24/20 0052  NA 142 142 143 143 144  K 3.4* 2.9* 4.1 3.8 3.7  CL 103 104 107 105 110  CO2 22 23 20* 23 22  GLUCOSE 105* 97 90 117*  93  BUN 189* 187* 180* 169* 166*  CREATININE 5.80* 5.59* 5.06* 4.99* 4.85*  CALCIUM 8.8* 8.9 9.2 9.7 9.4  MG  --   --  2.1  --   --   PHOS  --  6.3* 5.7* 5.6* 5.5*    GFR: Estimated Creatinine Clearance: 11.3 mL/min (A) (by C-G formula based on SCr of 4.85 mg/dL (H)). Liver Function Tests: Recent Labs  Lab 11/19/20 1356 11/21/20 0342 11/22/20 0019 11/23/20 0357 11/24/20 0052  AST 40  --  22  --   --   ALT 37  --  21  --   --   ALKPHOS 78  --  54  --   --   BILITOT 0.6  --  1.4*  --   --   PROT 6.2*  --  6.6  --   --   ALBUMIN 2.0* 3.2* 3.6 3.1* 3.1*    No results for input(s): LIPASE, AMYLASE in the last 168 hours. No results for input(s): AMMONIA in the last 168 hours. Coagulation Profile: No results for input(s): INR, PROTIME in the last 168 hours. Cardiac Enzymes: Recent Labs  Lab 11/20/20 1557  CKTOTAL 147    BNP (last 3 results) No results for input(s): PROBNP in the last 8760 hours. HbA1C: No results for input(s): HGBA1C in the last 72 hours. CBG: No results for input(s): GLUCAP in the last 168 hours. Lipid Profile: No results for input(s): CHOL, HDL, LDLCALC, TRIG, CHOLHDL, LDLDIRECT in the last 72 hours. Thyroid Function Tests: No results for input(s): TSH, T4TOTAL, FREET4, T3FREE, THYROIDAB in the last 72 hours. Anemia Panel: No results for input(s): VITAMINB12, FOLATE, FERRITIN, TIBC, IRON, RETICCTPCT in the last 72 hours.  Sepsis Labs: Recent Labs  Lab 11/19/20 1821  LATICACIDVEN 0.7     Recent Results (from the past 240 hour(s))  Resp Panel by RT-PCR (Flu A&B, Covid) Nasopharyngeal Swab     Status: None   Collection Time: 11/19/20  5:19 PM   Specimen: Nasopharyngeal Swab; Nasopharyngeal(NP) swabs in vial transport medium  Result Value Ref Range Status   SARS Coronavirus 2 by RT PCR NEGATIVE NEGATIVE Final    Comment: (NOTE) SARS-CoV-2 target nucleic acids are NOT DETECTED.  The SARS-CoV-2 RNA is generally detectable in upper  respiratory specimens during the acute phase of infection. The lowest concentration of SARS-CoV-2 viral copies this assay can detect is 138 copies/mL. A negative result does not preclude SARS-Cov-2 infection and should not be used as the sole basis for treatment or other patient management decisions. A negative result may occur with  improper specimen collection/handling, submission of specimen other than nasopharyngeal swab, presence of viral mutation(s) within the areas targeted by  this assay, and inadequate number of viral copies(<138 copies/mL). A negative result must be combined with clinical observations, patient history, and epidemiological information. The expected result is Negative.  Fact Sheet for Patients:  EntrepreneurPulse.com.au  Fact Sheet for Healthcare Providers:  IncredibleEmployment.be  This test is no t yet approved or cleared by the [place] FDA and  has been authorized for detection and/or diagnosis of SARS-CoV-2 by FDA under an Emergency Use Authorization (EUA). This EUA will remain  in effect (meaning this test can be used) for the duration of the COVID-19 declaration under Section 564(b)(1) of the Act, 21 U.S.C.section 360bbb-3(b)(1), unless the authorization is terminated  or revoked sooner.       Influenza A by PCR NEGATIVE NEGATIVE Final   Influenza B by PCR NEGATIVE NEGATIVE Final    Comment: (NOTE) The Xpert Xpress SARS-CoV-2/FLU/RSV plus assay is intended as an aid in the diagnosis of influenza from Nasopharyngeal swab specimens and should not be used as a sole basis for treatment. Nasal washings and aspirates are unacceptable for Xpert Xpress SARS-CoV-2/FLU/RSV testing.  Fact Sheet for Patients: EntrepreneurPulse.com.au  Fact Sheet for Healthcare Providers: IncredibleEmployment.be  This test is not yet approved or cleared by the [place] FDA and has been  authorized for detection and/or diagnosis of SARS-CoV-2 by FDA under an Emergency Use Authorization (EUA). This EUA will remain in effect (meaning this test can be used) for the duration of the COVID-19 declaration under Section 564(b)(1) of the Act, 21 U.S.C. section 360bbb-3(b)(1), unless the authorization is terminated or revoked.  Performed at [facility], [place] [address] [place], [place] [address]   Gastrointestinal Panel by PCR , Stool     Status: None   Collection Time: 11/20/20  6:48 PM   Specimen: Stool  Result Value Ref Range Status   Campylobacter species NOT DETECTED NOT DETECTED Final   Plesimonas shigelloides NOT DETECTED NOT DETECTED Final   Salmonella species NOT DETECTED NOT DETECTED Final   Yersinia enterocolitica NOT DETECTED NOT DETECTED Final   Vibrio species NOT DETECTED NOT DETECTED Final   Vibrio cholerae NOT DETECTED NOT DETECTED Final   Enteroaggregative E coli (EAEC) NOT DETECTED NOT DETECTED Final   Enteropathogenic E coli (EPEC) NOT DETECTED NOT DETECTED Final   Enterotoxigenic E coli (ETEC) NOT DETECTED NOT DETECTED Final   Shiga like toxin producing E coli (STEC) NOT DETECTED NOT DETECTED Final   Shigella/Enteroinvasive E coli (EIEC) NOT DETECTED NOT DETECTED Final   Cryptosporidium NOT DETECTED NOT DETECTED Final   Cyclospora cayetanensis NOT DETECTED NOT DETECTED Final   Entamoeba histolytica NOT DETECTED NOT DETECTED Final   Giardia lamblia NOT DETECTED NOT DETECTED Final   Adenovirus F40/41 NOT DETECTED NOT DETECTED Final   Astrovirus NOT DETECTED NOT DETECTED Final   Norovirus GI/GII NOT DETECTED NOT DETECTED Final   Rotavirus A NOT DETECTED NOT DETECTED Final   Sapovirus (I, II, IV, and V) NOT DETECTED NOT DETECTED Final    Comment: Performed at [facility], [address] [place], [place] [address]  C Difficile Quick Screen w PCR reflex     Status: None   Collection Time: 11/20/20  6:48 PM   Specimen: STOOL  Result Value  Ref Range Status   C Diff antigen NEGATIVE NEGATIVE Final   C Diff toxin NEGATIVE NEGATIVE Final   C Diff interpretation No C. difficile detected.  Final    Comment: Performed at [facility], [place] [address] [place], [place] [address]  RN Pressure Injury Documentation: Pressure Injury 11/20/20 Upper;Lower;Left Stage 3 -  Full thickness tissue loss. Subcutaneous fat may be visible but bone, tendon or muscle are NOT exposed. (Active)  11/20/20   Location:   Location Orientation: Upper;Lower;Left  Staging: Stage 3 -  Full thickness tissue loss. Subcutaneous fat may be visible but bone, tendon or muscle are NOT exposed.  Wound Description (Comments):   Present on Admission: Yes     Pressure Injury 11/20/20 Right;Lower;Upper Unstageable - Full thickness tissue loss in which the base of the injury is covered by slough (yellow, tan, gray, green or brown) and/or eschar (tan, brown or black) in the wound bed. (Active)  11/20/20   Location:   Location Orientation: Right;Lower;Upper  Staging: Unstageable - Full thickness tissue loss in which the base of the injury is covered by slough (yellow, tan, gray, green or brown) and/or eschar (tan, brown or black) in the wound bed.  Wound Description (Comments):   Present on Admission: Yes     Pressure Injury 11/21/20 Thigh Left;Posterior;Proximal Unstageable - Full thickness tissue loss in which the base of the injury is covered by slough (yellow, tan, gray, green or brown) and/or eschar (tan, brown or black) in the wound bed. (Active)  11/21/20 0800  Location: Thigh  Location Orientation: Left;Posterior;Proximal  Staging: Unstageable - Full thickness tissue loss in which the base of the injury is covered by slough (yellow, tan, gray, green or brown) and/or eschar (tan, brown or black) in the wound bed.  Wound Description (Comments):   Present on Admission: Yes     Pressure Injury 11/21/20 Buttocks Right Stage 2 -  Partial thickness loss of  dermis presenting as a shallow open injury with a red, pink wound bed without slough. (Active)  11/21/20 0800  Location: Buttocks  Location Orientation: Right  Staging: Stage 2 -  Partial thickness loss of dermis presenting as a shallow open injury with a red, pink wound bed without slough.  Wound Description (Comments):   Present on Admission: Yes     Pressure Injury 11/21/20 Thigh Posterior;Proximal;Right Unstageable - Full thickness tissue loss in which the base of the injury is covered by slough (yellow, tan, gray, green or brown) and/or eschar (tan, brown or black) in the wound bed. (Active)  11/21/20 0800  Location: Thigh  Location Orientation: Posterior;Proximal;Right  Staging: Unstageable - Full thickness tissue loss in which the base of the injury is covered by slough (yellow, tan, gray, green or brown) and/or eschar (tan, brown or black) in the wound bed.  Wound Description (Comments):   Present on Admission: Yes     Estimated body mass index is 31.74 kg/m as calculated from the following:   Height as of this encounter: 5\' 6"  (1.676 m).   Weight as of this encounter: 89.2 kg.  Malnutrition Type:   Malnutrition Characteristics:   Nutrition Interventions:    Radiology Studies: No results found.  Scheduled Meds:  sodium chloride   Intravenous Once   amLODipine  5 mg Oral QHS   aspirin EC  81 mg Oral Daily   calcitRIOL  0.25 mcg Oral Once per day on Mon Fri   capsicum   Topical BID   carvedilol  25 mg Oral BID WC   collagenase   Topical Daily   [START ON 11/28/2020] darbepoetin (ARANESP) injection - NON-DIALYSIS  100 mcg Subcutaneous Q Tue-1800   DULoxetine  30 mg Oral Daily   gabapentin  100 mg Oral QHS   heparin injection (subcutaneous)  5,000 Units Subcutaneous Q12H   multivitamin with minerals  1 tablet Oral Daily   pantoprazole  40 mg Oral Q0600   Continuous Infusions:  sodium chloride 50 mL/hr at 11/24/20 0846    LOS: 5 days   Oren Binet, DO [facility] PAGER is on [place]  If 7PM-7AM, please contact night-coverage [email or web address]

## 2020-11-25 DIAGNOSIS — Z7189 Other specified counseling: Secondary | ICD-10-CM

## 2020-11-25 LAB — RENAL FUNCTION PANEL
Albumin: 2.8 g/dL — ABNORMAL LOW (ref 3.5–5.0)
Anion gap: 11 (ref 5–15)
BUN: 165 mg/dL — ABNORMAL HIGH (ref 8–23)
CO2: 19 mmol/L — ABNORMAL LOW (ref 22–32)
Calcium: 9 mg/dL (ref 8.9–10.3)
Chloride: 110 mmol/L (ref 98–111)
Creatinine, Ser: 4.55 mg/dL — ABNORMAL HIGH (ref 0.44–1.00)
GFR, Estimated: 10 mL/min — ABNORMAL LOW (ref >60)
Glucose, Bld: 86 mg/dL (ref 70–99)
Phosphorus: 5.1 mg/dL — ABNORMAL HIGH (ref 2.5–4.6)
Potassium: 3.7 mmol/L (ref 3.5–5.1)
Sodium: 140 mmol/L (ref 135–145)

## 2020-11-25 NOTE — TOC Progression Note (Signed)
Transition of Care [facility]) - Progression Note    Patient Details  Name: [name] MRN: [id number] Date of Birth: 09-14-1945  [facility] CM/SW Early, LCSW Phone Number: 11/25/2020, 9:11 AM  Clinical Narrative:    CSW and MSW Intern met with patient at bedside. She stated she is feeling somewhat better but still nauseous. Her husband has been visiting. She is requesting [facility] for rehab. CSW will follow up on insurance authorization.    Expected Discharge Plan: Skilled Nursing Facility Barriers to Discharge: Ship broker, Continued Medical Work up  Expected Discharge Plan and Services Expected Discharge Plan: [place] In-house Referral: Clinical Social Work Discharge Planning Services: CM Consult Post Acute Care Choice: [place] Living arrangements for the past 2 months: Single Family Home                                       Social Determinants of Health (SDOH) Interventions    Readmission Risk Interventions Readmission Risk Prevention Plan 07/15/2018  Post Dischage Appt Complete  Medication Screening Complete  Transportation Screening Complete  Some recent data might be hidden

## 2020-11-25 NOTE — Progress Notes (Signed)
[facility] NEPHROLOGY PROGRESS NOTE  Assessment/ Plan:  #  Acute kidney injury on chronic kidney disease stage IV, nonoliguric: Baseline creatinine level seems to be around 2.4-2.6 and was followed by Dr.Upton.  AKI presumably hemodynamically mediated in the setting of diarrhea and may have some progressive underlying CKD. BUN very high concern for possible bleeding.  Decent urine output.  Slow but gradual improvement of creatinine level.  She reports feeling much better without any uremic symptoms, denies nausea today.  She confirms with me again that she does not want any aggressive measures including dialysis.  I will continue IV fluid as she has poor oral intake.  I consulted palliative care to address goals of care. Fortunately there is no urgent indication for dialysis.    # Hypokalemia: Secondary to poor intake/diarrhea-corrected with oral replacement.  # Chronic hypoxic respiratory failure: She appears to be at baseline with regards to her respiratory status on oxygen supplementation at 2 L/min via nasal cannula.  # Anemia of chronic kidney disease/chronic illness: With iron deficiency and associated anemia, started on iron supplementation/ESA.  Seen by gastroenterology with concerns of positive FOBT (entirely possible that elevated BUN may in part be from GI bleed).  #  Chronic cervical spine radiculopathy with chronic back pain with acute on chronic ambulation dysfunction: Suspect that with her limitations and current social support, she will unlikely be able to return home and will need placement at a skilled nursing facility.  Discussed with the primary team.   Subjective: Seen and examined at bedside.  Urine output is recorded only 800 cc.  Denies nausea, vomiting, chest pain, shortness of breath.  Reports feeling better.  She still has poor oral intake.  Objective Vital signs in last 24 hours: Vitals:   11/25/20 0000 11/25/20 0400 11/25/20 0745 11/25/20 1126   BP: (!) 112/57 (!) 118/55 133/65 125/62  Pulse: 80 73 76 74  Resp: (!) 21 18 17  (!) 23  Temp: 97.6 F (36.4 C) 97.8 F (36.6 C) 97.9 F (36.6 C) 98.2 F (36.8 C)  TempSrc: Oral Oral Oral Oral  SpO2: 99% 100% 99% 100%  Weight:      Height:       Weight change:   Intake/Output Summary (Last 24 hours) at 11/25/2020 1131 Last data filed at 11/25/2020 1106 Gross per 24 hour  Intake 1821.97 ml  Output 1300 ml  Net 521.97 ml        Labs: Basic Metabolic Panel: Recent Labs  Lab 11/23/20 0357 11/24/20 0052 11/25/20 0058  NA 143 144 140  K 3.8 3.7 3.7  CL 105 110 110  CO2 23 22 19*  GLUCOSE 117* 93 86  BUN 169* 166* 165*  CREATININE 4.99* 4.85* 4.55*  CALCIUM 9.7 9.4 9.0  PHOS 5.6* 5.5* 5.1*    Liver Function Tests: Recent Labs  Lab 11/19/20 1356 11/21/20 0342 11/22/20 0019 11/23/20 0357 11/24/20 0052 11/25/20 0058  AST 40  --  22  --   --   --   ALT 37  --  21  --   --   --   ALKPHOS 78  --  54  --   --   --   BILITOT 0.6  --  1.4*  --   --   --   PROT 6.2*  --  6.6  --   --   --   ALBUMIN 2.0*   < > 3.6 3.1* 3.1* 2.8*   < > = values in this  interval not displayed.    No results for input(s): LIPASE, AMYLASE in the last 168 hours. No results for input(s): AMMONIA in the last 168 hours. CBC: Recent Labs  Lab 11/19/20 1356 11/20/20 0645 11/21/20 1908 11/22/20 0019 11/23/20 0357 11/24/20 0052  WBC 16.3* 14.3* 13.5* 13.8* 14.0* 16.1*  NEUTROABS 14.2*  --  11.6* 11.6*  --   --   HGB 7.3* 7.7* 6.9* 7.8* 8.1* 8.4*  HCT 22.9* 25.1* 22.0* 25.1* 25.7* 27.0*  MCV 92.7 94.0 93.2 94.0 92.8 94.1  PLT 330 287 290 309 337 359    Cardiac Enzymes: Recent Labs  Lab 11/20/20 1557  CKTOTAL 147    CBG: No results for input(s): GLUCAP in the last 168 hours.  Iron Studies: No results for input(s): IRON, TIBC, TRANSFERRIN, FERRITIN in the last 72 hours. Studies/Results: No results found.  Medications: Infusions:  sodium chloride 50 mL/hr at 11/25/20  0533    Scheduled Medications:  sodium chloride   Intravenous Once   amLODipine  5 mg Oral QHS   aspirin EC  81 mg Oral Daily   calcitRIOL  0.25 mcg Oral Once per day on Mon Fri   capsicum   Topical BID   carvedilol  25 mg Oral BID WC   collagenase   Topical Daily   [START ON 11/28/2020] darbepoetin (ARANESP) injection - NON-DIALYSIS  100 mcg Subcutaneous Q Tue-1800   DULoxetine  30 mg Oral Daily   gabapentin  100 mg Oral QHS   heparin injection (subcutaneous)  5,000 Units Subcutaneous Q12H   multivitamin with minerals  1 tablet Oral Daily   pantoprazole  40 mg Oral Q0600    have reviewed scheduled and prn medications.  Physical Exam: General:NAD, comfortable Heart:RRR, s1s2 nl Lungs: Distant breath sound, no wheezing or crackle appreciated Abdomen:soft, Non-tender, non-distended Extremities:No LE edema Neurology: Alert, awake and following commands, no asterixis  Darel Ricketts Prasad Dewana Ammirati 11/25/2020,11:31 AM  LOS: 6 days

## 2020-11-25 NOTE — Consult Note (Signed)
Palliative Medicine Inpatient Consult Note  Reason for consult:  goals of care  HPI:  Per intake H&P -8/14 by Dr. Roosevelt Locks -> "[name] is a 75 y.o. female with medical history significant of CKD stage IV, chronic diastolic CHF with severe hypertrophic cardiomyopathy, chronic hypoxic respite failure with baseline 2 to 3 L all the time, severe AS status post biosynthetic aortic valve replacement, HTN, chronic cervical spine radiculopathy, chronic ambulation dysfunction secondary to chronic severe bilateral knee OA, Gout, frequent UTIs, morbid obesity, presented with worsening of bilateral knee pain and bilateral lower extremity weakness.   Patient has a baseline severe cervical spine radiculopathy and chronic neck pain, significantly patient has been wheelchair-bound for about 2 years because of bilateral severe knee OA and ambulation dysfunction.  He was told by his cardiology "no knee surgery for your heart problems". She used to get knee injection from orthopedic surgery every 8 to 10 weeks, and last injection was in February 2022.   Lately, his husband can no longer take care of her, and patient at baseline able to transfer herself from bed to wheelchair and go to bathroom, last week, she started wearing diapers, but after 2 days she developed rash and stopped using the diapers.  Family bought him a bedside commode chair, and she tried to use once and fell down the middle of night, denied any prodromes of lightheadedness blurry vision shortness of breath. She felt knee pain and weak, and she lied on the floor until morning.. She also has had significant decrease of p.o. intakes, she drinks only, one and half 1.6 oz cup of fluid a day.  She denies any cough, no chest pains, no diarrhea no urinary problems and no fever chills.   She has a CKD stage IV, follows with nephrology Dr. Hollie Salk every 6 months, last visit was earlier this year, but she was not sure whether blood work was done on that visit."    Clinical Assessment/Goals of Care: I have reviewed medical records including EPIC notes, labs and imaging, received report from bedside RN, assessed the patient.    I met with [name] and her husband [name] to further discuss diagnosis prognosis, GOC, EOL wishes, disposition and options.   I introduced Palliative Medicine as specialized medical care for people living with serious illness. It focuses on providing relief from the symptoms and stress of a serious illness. The goal is to improve quality of life for both the patient and the family.  A detailed discussion was had today regarding advanced directives.  Concepts specific to code status, artifical feeding and hydration, continued IV antibiotics and rehospitalization was had.  The difference between a aggressive medical intervention path  and a palliative comfort care path for this patient at this time was had. Values and goals of care important to patient and family were attempted to be elicited.  [name] had one son who dies from heart failure. They have a grandson and great grandchildren. 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 Nausea has been a challenge this admission but she states that today it is better. When I went to her room she was paying bills by  telephone. Her goal is to be able to rehabilitate to be able to cook meals again at home. She has received meals on wheels for the last year.   [name] wishes to have full resuscitation if she is in cardiopulmonary arrest. She would  want antibiotics if indicated and artifical feedings if indicated. She had an aunt who dies of kidney disease and was on dialysis for a long time. She does NOT wish to have dialysis even if indicated.   Discussed the importance of continued conversation with family and their  medical providers regarding overall plan of care and treatment options, ensuring decisions are within the  context of the patients values and GOCs.  Decision Maker: patient  SUMMARY OF RECOMMENDATIONS    Code Status/Advance Care Planning: FULL CODE  Symptom Management:  Knee and back pain: acetaminophen prn, capsicum cream bid  Nausea: ondansetron Palliative Prophylaxis:  GI prophylaxis Pantoprazole Dry eyes polyvinyl alcohol eye drops  Additional Recommendations (Limitations, Scope, Preferences): Antibiotics if indicated Artifical feeding if indicated NO Dialysis even if indicated    Psycho-social/Spiritual:  Desire for further Chaplaincy support: Christian, no desire for chaplain Additional Recommendations:    Prognosis: has acute on chronic kidney disease, chronic respiratory failure and chronic pain but is good rehab candidate  Discharge Planning: Goal of [place] for rehab, then transition to home. Lives in one level home with ramp, husband drives.     Vitals with BMI 11/25/2020 11/25/2020 11/25/2020  Height - - -  Weight - - -  BMI - - -  Systolic 520 802 233  Diastolic 57 62 65  Pulse 70 74 76    PPS: 50%   This conversation/these recommendations were discussed with patient primary care team,  Dr. Sloan Leiter via secure chat.  Thank you for the opportunity to participate in the care of this patient and family.   Time In:3:20 Time Out:4:30 Total Time: 70 minutes Greater than 50%  of this time was spent counseling and coordinating care related to the above assessment and plan.  Lindell Spar, NP [facility] Palliative Medicine Team Team Cell Phone: [phone] Please utilize secure chat with additional questions, if there is no response within 30 minutes please call the above phone number  Palliative Medicine Team providers are available by phone from 7am to 7pm daily and can be reached through the team cell phone.  Should this patient require assistance outside of these hours, please call the patient's attending physician.

## 2020-11-25 NOTE — Progress Notes (Signed)
PROGRESS NOTE    [name]  SWH:[id number] DOB: [birth year] DOA: 11/19/2020 PCP: Hoyt Koch, MD   Brief Narrative:  75 y.o. female with medical history significant of pulmonary HTN; HTN; stage 4 CKD; s/p bioprosthetic AVR with AAA repair (05/2017)HFpEF,Chronic hypoxic respiratory failure on 2 L-3 L at all times, Severe OA, chronic lower extremity weakness-barely ambulatory at home-presented with worsening weakness/diarrhea-she was found to have AKI and subsequently admitted to the hospitalist service.  See below for further details.  Subjective:  Claims that she feels better than yesterday-no nausea.  No vomiting.  No abdominal pain.  Assessment & Plan: AKI on CKD stage IV: AKI hemodynamically mediated-in the setting of diarrhea/diuretic use-although creatinine improving-BUN remains significantly elevated.  Initial concern for GI bleeding contributing to elevated BUN but does not appear to have any GI bleeding at this point.  Nephrology following-remains on IVF-we will await further recommendations.  Continue to avoid nephrotoxic agents.    Normocytic anemia: Hemoglobin stable-no overt GI bleeding apparent even though she is FOBT positive.  Continue to follow CBC.  Suspect anemia is probably due to acute illness superimposed on anemia of CKD-required 2 units of PRBC so far.  Aranesp/IV iron defer to nephrology.  Diarrhea: Significantly improved with supportive care.  C. difficile studies and GI pathogen panel was negative.  Severe OA with chronic bilateral lower extremity weakness/ambulatory dysfunction/chronic back pain: Per patient-she was very minimally ambulatory at baseline (per H&P mostly wheelchair-bound-but just about able to transfer from bed to wheelchair)-suspect that lower extremity weakness is mostly chronic-and due to OA/degenerative spine disease/spinal stenosis.  Per patient-due to her significant comorbid medical conditions-her outpatient cardiologist has recommended  against any surgical procedures.  She apparently follows with outpatient orthopedics for intra-articular steroid injections.  On exam today-she is easily able to move her legs side-to-side-and just about able to raise them off the bed (against gravity)-palpation-this is close to her usual baseline.  Continue supportive care-continue PT/OT-with overall improvement in her renal function and improvement in her debility/weakness due to renal failure-hopefully her lower extremity weakness will improve somewhat.  Plans are for SNF on discharge  HTN: BP stable-continue amlodipine and Coreg  HFpEF: Volume status stable-avoid diuretics given AKI.  History of severe AS-s/p aortic valve replacement with bioprosthetic valve   History of repair of ascending aortic aneurysm  Chronic hypoxic respiratory failure: On 2-3 L of oxygen at home  Obesity: Body mass index is 31.74 kg/m.   Pressure ulcer: Pressure Injury 11/20/20 Upper;Lower;Left Stage 3 -  Full thickness tissue loss. Subcutaneous fat may be visible but bone, tendon or muscle are NOT exposed. (Active)  11/20/20   Location:   Location Orientation: Upper;Lower;Left  Staging: Stage 3 -  Full thickness tissue loss. Subcutaneous fat may be visible but bone, tendon or muscle are NOT exposed.  Wound Description (Comments):   Present on Admission: Yes     Pressure Injury 11/20/20 Right;Lower;Upper Unstageable - Full thickness tissue loss in which the base of the injury is covered by slough (yellow, tan, gray, green or brown) and/or eschar (tan, brown or black) in the wound bed. (Active)  11/20/20   Location:   Location Orientation: Right;Lower;Upper  Staging: Unstageable - Full thickness tissue loss in which the base of the injury is covered by slough (yellow, tan, gray, green or brown) and/or eschar (tan, brown or black) in the wound bed.  Wound Description (Comments):   Present on Admission: Yes     Pressure Injury 11/21/20 Thigh  Left;Posterior;Proximal Unstageable -  Full thickness tissue loss in which the base of the injury is covered by slough (yellow, tan, gray, green or brown) and/or eschar (tan, brown or black) in the wound bed. (Active)  11/21/20 0800  Location: Thigh  Location Orientation: Left;Posterior;Proximal  Staging: Unstageable - Full thickness tissue loss in which the base of the injury is covered by slough (yellow, tan, gray, green or brown) and/or eschar (tan, brown or black) in the wound bed.  Wound Description (Comments):   Present on Admission: Yes     Pressure Injury 11/21/20 Buttocks Right Stage 2 -  Partial thickness loss of dermis presenting as a shallow open injury with a red, pink wound bed without slough. (Active)  11/21/20 0800  Location: Buttocks  Location Orientation: Right  Staging: Stage 2 -  Partial thickness loss of dermis presenting as a shallow open injury with a red, pink wound bed without slough.  Wound Description (Comments):   Present on Admission: Yes     Pressure Injury 11/21/20 Thigh Posterior;Proximal;Right Unstageable - Full thickness tissue loss in which the base of the injury is covered by slough (yellow, tan, gray, green or brown) and/or eschar (tan, brown or black) in the wound bed. (Active)  11/21/20 0800  Location: Thigh  Location Orientation: Posterior;Proximal;Right  Staging: Unstageable - Full thickness tissue loss in which the base of the injury is covered by slough (yellow, tan, gray, green or brown) and/or eschar (tan, brown or black) in the wound bed.  Wound Description (Comments):   Present on Admission: Yes   DVT prophylaxis: SCDs Code Status: FULL CODE  Family Communication: Spouse-[phone]-unable to leave a voicemail on 8/19 Disposition Plan: SNF  Status is: Inpatient  Remains inpatient appropriate because:Unsafe d/c plan, IV treatments appropriate due to intensity of illness or inability to take PO, and Inpatient level of care  appropriate due to severity of illness  Dispo: The patient is from: Home              Anticipated d/c is to:  TBD              Patient currently is not medically stable to d/c.   Difficult to place patient No  Consultants:  Nephrology   Procedures: None  Antimicrobials:  Anti-infectives (From admission, onward)    Start     Dose/Rate Route Frequency Ordered Stop   11/19/20 2057  cefTRIAXone (ROCEPHIN) 1 g in sodium chloride 0.9 % 100 mL IVPB        1 g 200 mL/hr over 30 Minutes Intravenous  Once 11/19/20 1847 11/19/20 2200        Objective: Vitals:   11/24/20 2000 11/25/20 0000 11/25/20 0400 11/25/20 0745  BP: 137/67 (!) 112/57 (!) 118/55 133/65  Pulse: 77 80 73 76  Resp: (!) 21 (!) 21 18 17   Temp: 97.8 F (36.6 C) 97.6 F (36.4 C) 97.8 F (36.6 C) 97.9 F (36.6 C)  TempSrc: Oral Oral Oral Oral  SpO2: 100% 99% 100% 99%  Weight:      Height:        Intake/Output Summary (Last 24 hours) at 11/25/2020 1035 Last data filed at 11/25/2020 0500 Gross per 24 hour  Intake 1581.97 ml  Output 800 ml  Net 781.97 ml    Filed Weights   11/19/20 1217 11/20/20 2327  Weight: 77.1 kg 89.2 kg   Examination: Gen Exam:Alert awake-not in any distress HEENT:atraumatic, normocephalic Chest: B/L clear to auscultation anteriorly CVS:S1S2 regular Abdomen:soft non tender, non distended Extremities:no edema  Neurology: Easily moving legs side-by-side-able to bend her knees-just about able to lift her legs off the bed (per patient this is close to her usual baseline) Skin: no rash   Data Reviewed: I have personally reviewed following labs and imaging studies  CBC: Recent Labs  Lab 11/19/20 1356 11/20/20 0645 11/21/20 1908 11/22/20 0019 11/23/20 0357 11/24/20 0052  WBC 16.3* 14.3* 13.5* 13.8* 14.0* 16.1*  NEUTROABS 14.2*  --  11.6* 11.6*  --   --   HGB 7.3* 7.7* 6.9* 7.8* 8.1* 8.4*  HCT 22.9* 25.1* 22.0* 25.1* 25.7* 27.0*  MCV 92.7 94.0 93.2 94.0 92.8 94.1  PLT 330 287 290  309 337 924    Basic Metabolic Panel: Recent Labs  Lab 11/21/20 0342 11/22/20 0019 11/23/20 0357 11/24/20 0052 11/25/20 0058  NA 142 143 143 144 140  K 2.9* 4.1 3.8 3.7 3.7  CL 104 107 105 110 110  CO2 23 20* 23 22 19*  GLUCOSE 97 90 117* 93 86  BUN 187* 180* 169* 166* 165*  CREATININE 5.59* 5.06* 4.99* 4.85* 4.55*  CALCIUM 8.9 9.2 9.7 9.4 9.0  MG  --  2.1  --   --   --   PHOS 6.3* 5.7* 5.6* 5.5* 5.1*    GFR: Estimated Creatinine Clearance: 12 mL/min (A) (by C-G formula based on SCr of 4.55 mg/dL (H)). Liver Function Tests: Recent Labs  Lab 11/19/20 1356 11/21/20 0342 11/22/20 0019 11/23/20 0357 11/24/20 0052 11/25/20 0058  AST 40  --  22  --   --   --   ALT 37  --  21  --   --   --   ALKPHOS 78  --  54  --   --   --   BILITOT 0.6  --  1.4*  --   --   --   PROT 6.2*  --  6.6  --   --   --   ALBUMIN 2.0* 3.2* 3.6 3.1* 3.1* 2.8*    No results for input(s): LIPASE, AMYLASE in the last 168 hours. No results for input(s): AMMONIA in the last 168 hours. Coagulation Profile: No results for input(s): INR, PROTIME in the last 168 hours. Cardiac Enzymes: Recent Labs  Lab 11/20/20 1557  CKTOTAL 147    BNP (last 3 results) No results for input(s): PROBNP in the last 8760 hours. HbA1C: No results for input(s): HGBA1C in the last 72 hours. CBG: No results for input(s): GLUCAP in the last 168 hours. Lipid Profile: No results for input(s): CHOL, HDL, LDLCALC, TRIG, CHOLHDL, LDLDIRECT in the last 72 hours. Thyroid Function Tests: No results for input(s): TSH, T4TOTAL, FREET4, T3FREE, THYROIDAB in the last 72 hours. Anemia Panel: No results for input(s): VITAMINB12, FOLATE, FERRITIN, TIBC, IRON, RETICCTPCT in the last 72 hours.  Sepsis Labs: Recent Labs  Lab 11/19/20 1821  LATICACIDVEN 0.7     Recent Results (from the past 240 hour(s))  Resp Panel by RT-PCR (Flu A&B, Covid) Nasopharyngeal Swab     Status: None   Collection Time: 11/19/20  5:19 PM   Specimen:  Nasopharyngeal Swab; Nasopharyngeal(NP) swabs in vial transport medium  Result Value Ref Range Status   SARS Coronavirus 2 by RT PCR NEGATIVE NEGATIVE Final    Comment: (NOTE) SARS-CoV-2 target nucleic acids are NOT DETECTED.  The SARS-CoV-2 RNA is generally detectable in upper respiratory specimens during the acute phase of infection. The lowest concentration of SARS-CoV-2 viral copies this assay can detect is 138 copies/mL. A negative  result does not preclude SARS-Cov-2 infection and should not be used as the sole basis for treatment or other patient management decisions. A negative result may occur with  improper specimen collection/handling, submission of specimen other than nasopharyngeal swab, presence of viral mutation(s) within the areas targeted by this assay, and inadequate number of viral copies(<138 copies/mL). A negative result must be combined with clinical observations, patient history, and epidemiological information. The expected result is Negative.  Fact Sheet for Patients:  EntrepreneurPulse.com.au  Fact Sheet for Healthcare Providers:  IncredibleEmployment.be  This test is no t yet approved or cleared by the [place] FDA and  has been authorized for detection and/or diagnosis of SARS-CoV-2 by FDA under an Emergency Use Authorization (EUA). This EUA will remain  in effect (meaning this test can be used) for the duration of the COVID-19 declaration under Section 564(b)(1) of the Act, 21 U.S.C.section 360bbb-3(b)(1), unless the authorization is terminated  or revoked sooner.       Influenza A by PCR NEGATIVE NEGATIVE Final   Influenza B by PCR NEGATIVE NEGATIVE Final    Comment: (NOTE) The Xpert Xpress SARS-CoV-2/FLU/RSV plus assay is intended as an aid in the diagnosis of influenza from Nasopharyngeal swab specimens and should not be used as a sole basis for treatment. Nasal washings and aspirates are unacceptable for  Xpert Xpress SARS-CoV-2/FLU/RSV testing.  Fact Sheet for Patients: EntrepreneurPulse.com.au  Fact Sheet for Healthcare Providers: IncredibleEmployment.be  This test is not yet approved or cleared by the [place] FDA and has been authorized for detection and/or diagnosis of SARS-CoV-2 by FDA under an Emergency Use Authorization (EUA). This EUA will remain in effect (meaning this test can be used) for the duration of the COVID-19 declaration under Section 564(b)(1) of the Act, 21 U.S.C. section 360bbb-3(b)(1), unless the authorization is terminated or revoked.  Performed at [facility], [place] [address] [place], [place] [address]   Gastrointestinal Panel by PCR , Stool     Status: None   Collection Time: 11/20/20  6:48 PM   Specimen: Stool  Result Value Ref Range Status   Campylobacter species NOT DETECTED NOT DETECTED Final   Plesimonas shigelloides NOT DETECTED NOT DETECTED Final   Salmonella species NOT DETECTED NOT DETECTED Final   Yersinia enterocolitica NOT DETECTED NOT DETECTED Final   Vibrio species NOT DETECTED NOT DETECTED Final   Vibrio cholerae NOT DETECTED NOT DETECTED Final   Enteroaggregative E coli (EAEC) NOT DETECTED NOT DETECTED Final   Enteropathogenic E coli (EPEC) NOT DETECTED NOT DETECTED Final   Enterotoxigenic E coli (ETEC) NOT DETECTED NOT DETECTED Final   Shiga like toxin producing E coli (STEC) NOT DETECTED NOT DETECTED Final   Shigella/Enteroinvasive E coli (EIEC) NOT DETECTED NOT DETECTED Final   Cryptosporidium NOT DETECTED NOT DETECTED Final   Cyclospora cayetanensis NOT DETECTED NOT DETECTED Final   Entamoeba histolytica NOT DETECTED NOT DETECTED Final   Giardia lamblia NOT DETECTED NOT DETECTED Final   Adenovirus F40/41 NOT DETECTED NOT DETECTED Final   Astrovirus NOT DETECTED NOT DETECTED Final   Norovirus GI/GII NOT DETECTED NOT DETECTED Final   Rotavirus A NOT DETECTED NOT DETECTED Final    Sapovirus (I, II, IV, and V) NOT DETECTED NOT DETECTED Final    Comment: Performed at [facility], [address], [place],  [address]  C Difficile Quick Screen w PCR reflex     Status: None   Collection Time: 11/20/20  6:48 PM   Specimen: STOOL  Result  Value Ref Range Status   C Diff antigen NEGATIVE NEGATIVE Final   C Diff toxin NEGATIVE NEGATIVE Final   C Diff interpretation No C. difficile detected.  Final    Comment: Performed at [facility], [place] [address], [place], [place] [address]    RN Pressure Injury Documentation: Pressure Injury 11/20/20 Upper;Lower;Left Stage 3 -  Full thickness tissue loss. Subcutaneous fat may be visible but bone, tendon or muscle are NOT exposed. (Active)  11/20/20   Location:   Location Orientation: Upper;Lower;Left  Staging: Stage 3 -  Full thickness tissue loss. Subcutaneous fat may be visible but bone, tendon or muscle are NOT exposed.  Wound Description (Comments):   Present on Admission: Yes     Pressure Injury 11/20/20 Right;Lower;Upper Unstageable - Full thickness tissue loss in which the base of the injury is covered by slough (yellow, tan, gray, green or brown) and/or eschar (tan, brown or black) in the wound bed. (Active)  11/20/20   Location:   Location Orientation: Right;Lower;Upper  Staging: Unstageable - Full thickness tissue loss in which the base of the injury is covered by slough (yellow, tan, gray, green or brown) and/or eschar (tan, brown or black) in the wound bed.  Wound Description (Comments):   Present on Admission: Yes     Pressure Injury 11/21/20 Thigh Left;Posterior;Proximal Unstageable - Full thickness tissue loss in which the base of the injury is covered by slough (yellow, tan, gray, green or brown) and/or eschar (tan, brown or black) in the wound bed. (Active)  11/21/20 0800  Location: Thigh  Location Orientation: Left;Posterior;Proximal  Staging: Unstageable - Full thickness tissue loss in  which the base of the injury is covered by slough (yellow, tan, gray, green or brown) and/or eschar (tan, brown or black) in the wound bed.  Wound Description (Comments):   Present on Admission: Yes     Pressure Injury 11/21/20 Buttocks Right Stage 2 -  Partial thickness loss of dermis presenting as a shallow open injury with a red, pink wound bed without slough. (Active)  11/21/20 0800  Location: Buttocks  Location Orientation: Right  Staging: Stage 2 -  Partial thickness loss of dermis presenting as a shallow open injury with a red, pink wound bed without slough.  Wound Description (Comments):   Present on Admission: Yes     Pressure Injury 11/21/20 Thigh Posterior;Proximal;Right Unstageable - Full thickness tissue loss in which the base of the injury is covered by slough (yellow, tan, gray, green or brown) and/or eschar (tan, brown or black) in the wound bed. (Active)  11/21/20 0800  Location: Thigh  Location Orientation: Posterior;Proximal;Right  Staging: Unstageable - Full thickness tissue loss in which the base of the injury is covered by slough (yellow, tan, gray, green or brown) and/or eschar (tan, brown or black) in the wound bed.  Wound Description (Comments):   Present on Admission: Yes     Estimated body mass index is 31.74 kg/m as calculated from the following:   Height as of this encounter: 5\' 6"  (1.676 m).   Weight as of this encounter: 89.2 kg.  Malnutrition Type:   Malnutrition Characteristics:   Nutrition Interventions:    Radiology Studies: No results found.  Scheduled Meds:  sodium chloride   Intravenous Once   amLODipine  5 mg Oral QHS   aspirin EC  81 mg Oral Daily   calcitRIOL  0.25 mcg Oral Once per day on Mon Fri   capsicum   Topical BID  carvedilol  25 mg Oral BID WC   collagenase   Topical Daily   [START ON 11/28/2020] darbepoetin (ARANESP) injection - NON-DIALYSIS  100 mcg Subcutaneous Q Tue-1800   DULoxetine  30 mg Oral Daily   gabapentin   100 mg Oral QHS   heparin injection (subcutaneous)  5,000 Units Subcutaneous Q12H   multivitamin with minerals  1 tablet Oral Daily   pantoprazole  40 mg Oral Q0600   Continuous Infusions:  sodium chloride 50 mL/hr at 11/25/20 0533    LOS: 6 days   Oren Binet, DO [facility] PAGER is on [place]  If 7PM-7AM, please contact night-coverage [email or web address]

## 2020-11-25 NOTE — TOC Progression Note (Signed)
Transition of Care [facility]) - Progression Note    Patient Details  Name: [name] MRN: [id number] Date of Birth: 1946/01/28  Transition of Care [facility]) CM/SW [place], LCSW Phone Number: 11/25/2020, 9:05 AM  Clinical Narrative:    CSW submitted clinicals to [facility] for review for [employer]. Ref #[id number].   Expected Discharge Plan: Skilled Nursing Facility Barriers to Discharge: Ship broker, Continued Medical Work up  Expected Discharge Plan and Services Expected Discharge Plan: [place] In-house Referral: Clinical Social Work Discharge Planning Services: CM Consult Post Acute Care Choice: [place] Living arrangements for the past 2 months: Single Family Home                                       Social Determinants of Health (SDOH) Interventions    Readmission Risk Interventions Readmission Risk Prevention Plan 07/15/2018  Post Dischage Appt Complete  Medication Screening Complete  Transportation Screening Complete  Some recent data might be hidden

## 2020-11-26 ENCOUNTER — Encounter (HOSPITAL_COMMUNITY): Payer: Self-pay | Admitting: Internal Medicine

## 2020-11-26 DIAGNOSIS — G2581 Restless legs syndrome: Secondary | ICD-10-CM

## 2020-11-26 LAB — RENAL FUNCTION PANEL
Albumin: 2.6 g/dL — ABNORMAL LOW (ref 3.5–5.0)
Anion gap: 11 (ref 5–15)
BUN: 157 mg/dL — ABNORMAL HIGH (ref 8–23)
CO2: 20 mmol/L — ABNORMAL LOW (ref 22–32)
Calcium: 8.8 mg/dL — ABNORMAL LOW (ref 8.9–10.3)
Chloride: 115 mmol/L — ABNORMAL HIGH (ref 98–111)
Creatinine, Ser: 4.32 mg/dL — ABNORMAL HIGH (ref 0.44–1.00)
GFR, Estimated: 10 mL/min — ABNORMAL LOW (ref >60)
Glucose, Bld: 98 mg/dL (ref 70–99)
Phosphorus: 4.6 mg/dL (ref 2.5–4.6)
Potassium: 3.7 mmol/L (ref 3.5–5.1)
Sodium: 146 mmol/L — ABNORMAL HIGH (ref 135–145)

## 2020-11-26 LAB — CBC
HCT: 24.3 % — ABNORMAL LOW (ref 36.0–46.0)
Hemoglobin: 7.5 g/dL — ABNORMAL LOW (ref 12.0–15.0)
MCH: 29.6 pg (ref 26.0–34.0)
MCHC: 30.9 g/dL (ref 30.0–36.0)
MCV: 96 fL (ref 80.0–100.0)
Platelets: 296 10*3/uL (ref 150–400)
RBC: 2.53 MIL/uL — ABNORMAL LOW (ref 3.87–5.11)
RDW: 16.2 % — ABNORMAL HIGH (ref 11.5–15.5)
WBC: 12.1 10*3/uL — ABNORMAL HIGH (ref 4.0–10.5)
nRBC: 0 % (ref 0.0–0.2)

## 2020-11-26 MED ORDER — METHOCARBAMOL 500 MG PO TABS
500.0000 mg | ORAL_TABLET | Freq: Once | ORAL | Status: AC
  Administered 2020-11-26: 500 mg via ORAL
  Filled 2020-11-26: qty 1

## 2020-11-26 NOTE — Progress Notes (Signed)
PROGRESS NOTE    [name]  DXA:[id number] DOB: [birth year] DOA: 11/19/2020 PCP: Hoyt Koch, MD   Brief Narrative:  75 y.o. female with medical history significant of pulmonary HTN; HTN; stage 4 CKD; s/p bioprosthetic AVR with AAA repair (05/2017)HFpEF,Chronic hypoxic respiratory failure on 2 L-3 L at all times, Severe OA, chronic lower extremity weakness-barely ambulatory at home-presented with worsening weakness/diarrhea-she was found to have AKI and subsequently admitted to the hospitalist service.  See below for further details.  Subjective:  Some back pain today-but no diarrhea.  Nausea has resolved.  No other issues/no other complaints.  Assessment & Plan: AKI on CKD stage IV: AKI hemodynamically mediated-in setting of diarrhea/diuretic use-renal function slowly improving.  BUN still significantly elevated.  Nephrology following-remains on IVF-continue to avoid nephrotoxic agents.    Normocytic anemia: Hemoglobin stable-no overt GI bleeding apparent even though she is FOBT positive.  Continue to follow CBC.  Suspect anemia is probably due to acute illness superimposed on anemia of CKD-required 2 units of PRBC so far.  Aranesp/IV iron defer to nephrology.  Diarrhea: Significantly improved with supportive care.  C. difficile studies and GI pathogen panel was negative.  Severe OA with chronic bilateral lower extremity weakness/ambulatory dysfunction/chronic back pain: Per patient-she was very minimally ambulatory at baseline (per H&P mostly wheelchair-bound-but just about able to transfer from bed to wheelchair)-suspect that lower extremity weakness is mostly chronic-and due to OA/degenerative spine disease/spinal stenosis.  Per patient-due to her significant comorbid medical conditions-her outpatient cardiologist has recommended against any surgical procedures.  She apparently follows with outpatient orthopedics for intra-articular steroid injections.  On exam today-she is easily  able to move her legs side-to-side-and just about able to raise them off the bed (against gravity)-palpation-this is close to her usual baseline.  Continue supportive care-continue PT/OT-with overall improvement in her renal function and improvement in her debility/weakness due to renal failure-hopefully her lower extremity weakness will improve somewhat.  Plans are for SNF on discharge  HTN: BP stable-continue amlodipine and Coreg  HFpEF: Volume status stable-avoid diuretics given AKI.  History of severe AS-s/p aortic valve replacement with bioprosthetic valve   History of repair of ascending aortic aneurysm  Chronic hypoxic respiratory failure: On 2-3 L of oxygen at home  Obesity: Body mass index is 31.74 kg/m.   Pressure ulcer: Pressure Injury 11/20/20 Upper;Lower;Left Stage 3 -  Full thickness tissue loss. Subcutaneous fat may be visible but bone, tendon or muscle are NOT exposed. (Active)  11/20/20   Location:   Location Orientation: Upper;Lower;Left  Staging: Stage 3 -  Full thickness tissue loss. Subcutaneous fat may be visible but bone, tendon or muscle are NOT exposed.  Wound Description (Comments):   Present on Admission: Yes     Pressure Injury 11/20/20 Right;Lower;Upper Unstageable - Full thickness tissue loss in which the base of the injury is covered by slough (yellow, tan, gray, green or brown) and/or eschar (tan, brown or black) in the wound bed. (Active)  11/20/20   Location:   Location Orientation: Right;Lower;Upper  Staging: Unstageable - Full thickness tissue loss in which the base of the injury is covered by slough (yellow, tan, gray, green or brown) and/or eschar (tan, brown or black) in the wound bed.  Wound Description (Comments):   Present on Admission: Yes     Pressure Injury 11/21/20 Thigh Left;Posterior;Proximal Unstageable - Full thickness tissue loss in which the base of the injury is covered by slough (yellow, tan, gray, green or brown) and/or eschar  (tan,  brown or black) in the wound bed. (Active)  11/21/20 0800  Location: Thigh  Location Orientation: Left;Posterior;Proximal  Staging: Unstageable - Full thickness tissue loss in which the base of the injury is covered by slough (yellow, tan, gray, green or brown) and/or eschar (tan, brown or black) in the wound bed.  Wound Description (Comments):   Present on Admission: Yes     Pressure Injury 11/21/20 Buttocks Right Stage 2 -  Partial thickness loss of dermis presenting as a shallow open injury with a red, pink wound bed without slough. (Active)  11/21/20 0800  Location: Buttocks  Location Orientation: Right  Staging: Stage 2 -  Partial thickness loss of dermis presenting as a shallow open injury with a red, pink wound bed without slough.  Wound Description (Comments):   Present on Admission: Yes     Pressure Injury 11/21/20 Thigh Posterior;Proximal;Right Unstageable - Full thickness tissue loss in which the base of the injury is covered by slough (yellow, tan, gray, green or brown) and/or eschar (tan, brown or black) in the wound bed. (Active)  11/21/20 0800  Location: Thigh  Location Orientation: Posterior;Proximal;Right  Staging: Unstageable - Full thickness tissue loss in which the base of the injury is covered by slough (yellow, tan, gray, green or brown) and/or eschar (tan, brown or black) in the wound bed.  Wound Description (Comments):   Present on Admission: Yes   DVT prophylaxis: SCDs Code Status: FULL CODE  Family Communication: Spouse-Charles-[phone]-unable to leave a voicemail on 8/19 Disposition Plan: SNF  Status is: Inpatient  Remains inpatient appropriate because:Unsafe d/c plan, IV treatments appropriate due to intensity of illness or inability to take PO, and Inpatient level of care appropriate due to severity of illness  Dispo: The patient is from: Home              Anticipated d/c is to:  TBD              Patient currently is not medically stable to  d/c.   Difficult to place patient No  Consultants:  Nephrology   Procedures: None  Antimicrobials:  Anti-infectives (From admission, onward)    Start     Dose/Rate Route Frequency Ordered Stop   11/19/20 2057  cefTRIAXone (ROCEPHIN) 1 g in sodium chloride 0.9 % 100 mL IVPB        1 g 200 mL/hr over 30 Minutes Intravenous  Once 11/19/20 1847 11/19/20 2200        Objective: Vitals:   11/25/20 2049 11/25/20 2349 11/26/20 0348 11/26/20 0752  BP: 113/60 139/60 134/64 (!) 128/57  Pulse: 68 73 73 69  Resp: 20 18 18 16   Temp: 98.2 F (36.8 C) 99.3 F (37.4 C) 98.3 F (36.8 C) 98.8 F (37.1 C)  TempSrc: Oral Oral Oral Oral  SpO2: 99% 100% 100% 100%  Weight:      Height:        Intake/Output Summary (Last 24 hours) at 11/26/2020 1129 Last data filed at 11/26/2020 0544 Gross per 24 hour  Intake --  Output 400 ml  Net -400 ml    Filed Weights   11/19/20 1217 11/20/20 2327  Weight: 77.1 kg 89.2 kg   Examination: Gen Exam:Alert awake-not in any distress HEENT:atraumatic, normocephalic Chest: B/L clear to auscultation anteriorly CVS:S1S2 regular Abdomen:soft non tender, non distended Extremities:no edema Neurology: Able to move legs side-to-side-just about able to lift her legs off the bed (per patient this is close to baseline) Skin: no rash   Data  Reviewed: I have personally reviewed following labs and imaging studies  CBC: Recent Labs  Lab 11/19/20 1356 11/20/20 0645 11/21/20 1908 11/22/20 0019 11/23/20 0357 11/24/20 0052 11/26/20 0107  WBC 16.3*   < > 13.5* 13.8* 14.0* 16.1* 12.1*  NEUTROABS 14.2*  --  11.6* 11.6*  --   --   --   HGB 7.3*   < > 6.9* 7.8* 8.1* 8.4* 7.5*  HCT 22.9*   < > 22.0* 25.1* 25.7* 27.0* 24.3*  MCV 92.7   < > 93.2 94.0 92.8 94.1 96.0  PLT 330   < > 290 309 337 359 296   < > = values in this interval not displayed.    Basic Metabolic Panel: Recent Labs  Lab 11/22/20 0019 11/23/20 0357 11/24/20 0052 11/25/20 0058  11/26/20 0107  NA 143 143 144 140 146*  K 4.1 3.8 3.7 3.7 3.7  CL 107 105 110 110 115*  CO2 20* 23 22 19* 20*  GLUCOSE 90 117* 93 86 98  BUN 180* 169* 166* 165* 157*  CREATININE 5.06* 4.99* 4.85* 4.55* 4.32*  CALCIUM 9.2 9.7 9.4 9.0 8.8*  MG 2.1  --   --   --   --   PHOS 5.7* 5.6* 5.5* 5.1* 4.6    GFR: Estimated Creatinine Clearance: 12.7 mL/min (A) (by C-G formula based on SCr of 4.32 mg/dL (H)). Liver Function Tests: Recent Labs  Lab 11/19/20 1356 11/21/20 0342 11/22/20 0019 11/23/20 0357 11/24/20 0052 11/25/20 0058 11/26/20 0107  AST 40  --  22  --   --   --   --   ALT 37  --  21  --   --   --   --   ALKPHOS 78  --  54  --   --   --   --   BILITOT 0.6  --  1.4*  --   --   --   --   PROT 6.2*  --  6.6  --   --   --   --   ALBUMIN 2.0*   < > 3.6 3.1* 3.1* 2.8* 2.6*   < > = values in this interval not displayed.    No results for input(s): LIPASE, AMYLASE in the last 168 hours. No results for input(s): AMMONIA in the last 168 hours. Coagulation Profile: No results for input(s): INR, PROTIME in the last 168 hours. Cardiac Enzymes: Recent Labs  Lab 11/20/20 1557  CKTOTAL 147    BNP (last 3 results) No results for input(s): PROBNP in the last 8760 hours. HbA1C: No results for input(s): HGBA1C in the last 72 hours. CBG: No results for input(s): GLUCAP in the last 168 hours. Lipid Profile: No results for input(s): CHOL, HDL, LDLCALC, TRIG, CHOLHDL, LDLDIRECT in the last 72 hours. Thyroid Function Tests: No results for input(s): TSH, T4TOTAL, FREET4, T3FREE, THYROIDAB in the last 72 hours. Anemia Panel: No results for input(s): VITAMINB12, FOLATE, FERRITIN, TIBC, IRON, RETICCTPCT in the last 72 hours.  Sepsis Labs: Recent Labs  Lab 11/19/20 1821  LATICACIDVEN 0.7     Recent Results (from the past 240 hour(s))  Resp Panel by RT-PCR (Flu A&B, Covid) Nasopharyngeal Swab     Status: None   Collection Time: 11/19/20  5:19 PM   Specimen: Nasopharyngeal Swab;  Nasopharyngeal(NP) swabs in vial transport medium  Result Value Ref Range Status   SARS Coronavirus 2 by RT PCR NEGATIVE NEGATIVE Final    Comment: (NOTE) SARS-CoV-2 target nucleic acids are  NOT DETECTED.  The SARS-CoV-2 RNA is generally detectable in upper respiratory specimens during the acute phase of infection. The lowest concentration of SARS-CoV-2 viral copies this assay can detect is 138 copies/mL. A negative result does not preclude SARS-Cov-2 infection and should not be used as the sole basis for treatment or other patient management decisions. A negative result may occur with  improper specimen collection/handling, submission of specimen other than nasopharyngeal swab, presence of viral mutation(s) within the areas targeted by this assay, and inadequate number of viral copies(<138 copies/mL). A negative result must be combined with clinical observations, patient history, and epidemiological information. The expected result is Negative.  Fact Sheet for Patients:  EntrepreneurPulse.com.au  Fact Sheet for Healthcare Providers:  IncredibleEmployment.be  This test is no t yet approved or cleared by the [place] FDA and  has been authorized for detection and/or diagnosis of SARS-CoV-2 by FDA under an Emergency Use Authorization (EUA). This EUA will remain  in effect (meaning this test can be used) for the duration of the COVID-19 declaration under Section 564(b)(1) of the Act, 21 U.S.C.section 360bbb-3(b)(1), unless the authorization is terminated  or revoked sooner.       Influenza A by PCR NEGATIVE NEGATIVE Final   Influenza B by PCR NEGATIVE NEGATIVE Final    Comment: (NOTE) The Xpert Xpress SARS-CoV-2/FLU/RSV plus assay is intended as an aid in the diagnosis of influenza from Nasopharyngeal swab specimens and should not be used as a sole basis for treatment. Nasal washings and aspirates are unacceptable for Xpert Xpress  SARS-CoV-2/FLU/RSV testing.  Fact Sheet for Patients: EntrepreneurPulse.com.au  Fact Sheet for Healthcare Providers: IncredibleEmployment.be  This test is not yet approved or cleared by the [place] FDA and has been authorized for detection and/or diagnosis of SARS-CoV-2 by FDA under an Emergency Use Authorization (EUA). This EUA will remain in effect (meaning this test can be used) for the duration of the COVID-19 declaration under Section 564(b)(1) of the Act, 21 U.S.C. section 360bbb-3(b)(1), unless the authorization is terminated or revoked.  Performed at [facility], [place] [address] [place], [place] [address]   Gastrointestinal Panel by PCR , Stool     Status: None   Collection Time: 11/20/20  6:48 PM   Specimen: Stool  Result Value Ref Range Status   Campylobacter species NOT DETECTED NOT DETECTED Final   Plesimonas shigelloides NOT DETECTED NOT DETECTED Final   Salmonella species NOT DETECTED NOT DETECTED Final   Yersinia enterocolitica NOT DETECTED NOT DETECTED Final   Vibrio species NOT DETECTED NOT DETECTED Final   Vibrio cholerae NOT DETECTED NOT DETECTED Final   Enteroaggregative E coli (EAEC) NOT DETECTED NOT DETECTED Final   Enteropathogenic E coli (EPEC) NOT DETECTED NOT DETECTED Final   Enterotoxigenic E coli (ETEC) NOT DETECTED NOT DETECTED Final   Shiga like toxin producing E coli (STEC) NOT DETECTED NOT DETECTED Final   Shigella/Enteroinvasive E coli (EIEC) NOT DETECTED NOT DETECTED Final   Cryptosporidium NOT DETECTED NOT DETECTED Final   Cyclospora cayetanensis NOT DETECTED NOT DETECTED Final   Entamoeba histolytica NOT DETECTED NOT DETECTED Final   Giardia lamblia NOT DETECTED NOT DETECTED Final   Adenovirus F40/41 NOT DETECTED NOT DETECTED Final   Astrovirus NOT DETECTED NOT DETECTED Final   Norovirus GI/GII NOT DETECTED NOT DETECTED Final   Rotavirus A NOT DETECTED NOT DETECTED Final   Sapovirus (I, II,  IV, and V) NOT DETECTED NOT DETECTED Final    Comment: Performed at [facility],  [place], [place] [address]  C Difficile Quick Screen w PCR reflex     Status: None   Collection Time: 11/20/20  6:48 PM   Specimen: STOOL  Result Value Ref Range Status   C Diff antigen NEGATIVE NEGATIVE Final   C Diff toxin NEGATIVE NEGATIVE Final   C Diff interpretation No C. difficile detected.  Final    Comment: Performed at [facility], [place] [address], [place], [place] [address]    RN Pressure Injury Documentation: Pressure Injury 11/20/20 Upper;Lower;Left Stage 3 -  Full thickness tissue loss. Subcutaneous fat may be visible but bone, tendon or muscle are NOT exposed. (Active)  11/20/20   Location:   Location Orientation: Upper;Lower;Left  Staging: Stage 3 -  Full thickness tissue loss. Subcutaneous fat may be visible but bone, tendon or muscle are NOT exposed.  Wound Description (Comments):   Present on Admission: Yes     Pressure Injury 11/20/20 Right;Lower;Upper Unstageable - Full thickness tissue loss in which the base of the injury is covered by slough (yellow, tan, gray, green or brown) and/or eschar (tan, brown or black) in the wound bed. (Active)  11/20/20   Location:   Location Orientation: Right;Lower;Upper  Staging: Unstageable - Full thickness tissue loss in which the base of the injury is covered by slough (yellow, tan, gray, green or brown) and/or eschar (tan, brown or black) in the wound bed.  Wound Description (Comments):   Present on Admission: Yes     Pressure Injury 11/21/20 Thigh Left;Posterior;Proximal Unstageable - Full thickness tissue loss in which the base of the injury is covered by slough (yellow, tan, gray, green or brown) and/or eschar (tan, brown or black) in the wound bed. (Active)  11/21/20 0800  Location: Thigh  Location Orientation: Left;Posterior;Proximal  Staging: Unstageable - Full thickness tissue loss in which the base of the  injury is covered by slough (yellow, tan, gray, green or brown) and/or eschar (tan, brown or black) in the wound bed.  Wound Description (Comments):   Present on Admission: Yes     Pressure Injury 11/21/20 Buttocks Right Stage 2 -  Partial thickness loss of dermis presenting as a shallow open injury with a red, pink wound bed without slough. (Active)  11/21/20 0800  Location: Buttocks  Location Orientation: Right  Staging: Stage 2 -  Partial thickness loss of dermis presenting as a shallow open injury with a red, pink wound bed without slough.  Wound Description (Comments):   Present on Admission: Yes     Pressure Injury 11/21/20 Thigh Posterior;Proximal;Right Unstageable - Full thickness tissue loss in which the base of the injury is covered by slough (yellow, tan, gray, green or brown) and/or eschar (tan, brown or black) in the wound bed. (Active)  11/21/20 0800  Location: Thigh  Location Orientation: Posterior;Proximal;Right  Staging: Unstageable - Full thickness tissue loss in which the base of the injury is covered by slough (yellow, tan, gray, green or brown) and/or eschar (tan, brown or black) in the wound bed.  Wound Description (Comments):   Present on Admission: Yes     Estimated body mass index is 31.74 kg/m as calculated from the following:   Height as of this encounter: 5\' 6"  (1.676 m).   Weight as of this encounter: 89.2 kg.  Malnutrition Type:   Malnutrition Characteristics:   Nutrition Interventions:    Radiology Studies: No results found.  Scheduled Meds:  sodium chloride   Intravenous Once   amLODipine  5 mg Oral QHS   aspirin EC  81 mg Oral Daily   calcitRIOL  0.25 mcg Oral Once per day on Mon Fri   capsicum   Topical BID   carvedilol  25 mg Oral BID WC   collagenase   Topical Daily   [START ON 11/28/2020] darbepoetin (ARANESP) injection - NON-DIALYSIS  100 mcg Subcutaneous Q Tue-1800   DULoxetine  30 mg Oral Daily   gabapentin  100 mg Oral QHS    heparin injection (subcutaneous)  5,000 Units Subcutaneous Q12H   multivitamin with minerals  1 tablet Oral Daily   pantoprazole  40 mg Oral Q0600   Continuous Infusions:  sodium chloride 50 mL/hr at 11/25/20 0533    LOS: 7 days   Oren Binet, DO [facility] PAGER is on [place]  If 7PM-7AM, please contact night-coverage [email or web address]

## 2020-11-26 NOTE — Progress Notes (Addendum)
[facility] night shift MedSurg coverage note.  The nursing staff reported that the patient is having painful restless legs and requested something not as strong as fentanyl.  Methocarbamol 500 mg p.o. x1 dose ordered.  Tennis Must, MD.

## 2020-11-26 NOTE — Progress Notes (Signed)
[facility] PROGRESS NOTE  Assessment/ Plan:  #  Acute kidney injury on chronic kidney disease stage IV, nonoliguric: Baseline creatinine level seems to be around 2.4-2.6 and was followed by Dr.Upton.  AKI presumably hemodynamically mediated in the setting of diarrhea and may have some progressive underlying CKD. BUN very high concern for possible bleeding.  Decent urine output.  Slow but gradual improvement of creatinine level.  She reports feeling much better without any uremic symptoms, denies nausea today.  She confirms with me again that she does not want any aggressive measures including dialysis.  Seen by palliative care team as well. Fortunately there is no urgent indication for dialysis.  I will discontinue IV fluid today and encourage oral intake.     # Hypokalemia: Secondary to poor intake/diarrhea-corrected with oral replacement.  # Chronic hypoxic respiratory failure: She appears to be at baseline with regards to her respiratory status on oxygen supplementation at 2 L/min via nasal cannula.  # Anemia of chronic kidney disease/chronic illness: With iron deficiency and associated anemia, started on iron supplementation/ESA.  Seen by gastroenterology with concerns of positive FOBT (entirely possible that elevated BUN may in part be from GI bleed).  #  Chronic cervical spine radiculopathy with chronic back pain with acute on chronic ambulation dysfunction: Suspect that with her limitations and current social support, she will unlikely be able to return home and will need placement at a skilled nursing facility.  Discussed with the primary team.   Subjective: Seen and examined at bedside.  Urine output around 900 cc.  No new event.  Denies nausea, vomiting, chest pain, shortness of breath.  Seen by palliative care team and confirms that she does not want dialysis. Objective Vital signs in last 24 hours: Vitals:   11/25/20 2349 11/26/20 0348 11/26/20 0752  11/26/20 1221  BP: 139/60 134/64 (!) 128/57 125/66  Pulse: 73 73 69 66  Resp: 18 18 16 18   Temp: 99.3 F (37.4 C) 98.3 F (36.8 C) 98.8 F (37.1 C) 98.1 F (36.7 C)  TempSrc: Oral Oral Oral Oral  SpO2: 100% 100% 100% 99%  Weight:      Height:       Weight change:   Intake/Output Summary (Last 24 hours) at 11/26/2020 1317 Last data filed at 11/26/2020 0544 Gross per 24 hour  Intake --  Output 400 ml  Net -400 ml        Labs: Basic Metabolic Panel: Recent Labs  Lab 11/24/20 0052 11/25/20 0058 11/26/20 0107  NA 144 140 146*  K 3.7 3.7 3.7  CL 110 110 115*  CO2 22 19* 20*  GLUCOSE 93 86 98  BUN 166* 165* 157*  CREATININE 4.85* 4.55* 4.32*  CALCIUM 9.4 9.0 8.8*  PHOS 5.5* 5.1* 4.6    Liver Function Tests: Recent Labs  Lab 11/19/20 1356 11/21/20 0342 11/22/20 0019 11/23/20 0357 11/24/20 0052 11/25/20 0058 11/26/20 0107  AST 40  --  22  --   --   --   --   ALT 37  --  21  --   --   --   --   ALKPHOS 78  --  54  --   --   --   --   BILITOT 0.6  --  1.4*  --   --   --   --   PROT 6.2*  --  6.6  --   --   --   --   ALBUMIN 2.0*   < >  3.6   < > 3.1* 2.8* 2.6*   < > = values in this interval not displayed.    No results for input(s): LIPASE, AMYLASE in the last 168 hours. No results for input(s): AMMONIA in the last 168 hours. CBC: Recent Labs  Lab 11/19/20 1356 11/20/20 0645 11/21/20 1908 11/22/20 0019 11/23/20 0357 11/24/20 0052 11/26/20 0107  WBC 16.3*   < > 13.5* 13.8* 14.0* 16.1* 12.1*  NEUTROABS 14.2*  --  11.6* 11.6*  --   --   --   HGB 7.3*   < > 6.9* 7.8* 8.1* 8.4* 7.5*  HCT 22.9*   < > 22.0* 25.1* 25.7* 27.0* 24.3*  MCV 92.7   < > 93.2 94.0 92.8 94.1 96.0  PLT 330   < > 290 309 337 359 296   < > = values in this interval not displayed.    Cardiac Enzymes: Recent Labs  Lab 11/20/20 1557  CKTOTAL 147    CBG: No results for input(s): GLUCAP in the last 168 hours.  Iron Studies: No results for input(s): IRON, TIBC, TRANSFERRIN,  FERRITIN in the last 72 hours. Studies/Results: No results found.  Medications: Infusions:    Scheduled Medications:  sodium chloride   Intravenous Once   amLODipine  5 mg Oral QHS   aspirin EC  81 mg Oral Daily   calcitRIOL  0.25 mcg Oral Once per day on Mon Fri   capsicum   Topical BID   carvedilol  25 mg Oral BID WC   collagenase   Topical Daily   [START ON 11/28/2020] darbepoetin (ARANESP) injection - NON-DIALYSIS  100 mcg Subcutaneous Q Tue-1800   DULoxetine  30 mg Oral Daily   gabapentin  100 mg Oral QHS   heparin injection (subcutaneous)  5,000 Units Subcutaneous Q12H   multivitamin with minerals  1 tablet Oral Daily   pantoprazole  40 mg Oral Q0600    have reviewed scheduled and prn medications.  Physical Exam: General: Not in distress Heart:RRR, s1s2 nl Lungs: Distant breath sound anteriorly, clear, no wheeze or crackles Abdomen:soft, Non-tender, non-distended Extremities:No LE edema Neurology: Alert, awake and following commands, no asterixis  Gregoire Bennis Prasad Bryten Maher 11/26/2020,1:17 PM  LOS: 7 days

## 2020-11-27 LAB — CBC
HCT: 25.5 % — ABNORMAL LOW (ref 36.0–46.0)
Hemoglobin: 7.7 g/dL — ABNORMAL LOW (ref 12.0–15.0)
MCH: 29.5 pg (ref 26.0–34.0)
MCHC: 30.2 g/dL (ref 30.0–36.0)
MCV: 97.7 fL (ref 80.0–100.0)
Platelets: 288 10*3/uL (ref 150–400)
RBC: 2.61 MIL/uL — ABNORMAL LOW (ref 3.87–5.11)
RDW: 16.3 % — ABNORMAL HIGH (ref 11.5–15.5)
WBC: 12.2 10*3/uL — ABNORMAL HIGH (ref 4.0–10.5)
nRBC: 0 % (ref 0.0–0.2)

## 2020-11-27 LAB — RENAL FUNCTION PANEL
Albumin: 2.7 g/dL — ABNORMAL LOW (ref 3.5–5.0)
Anion gap: 10 (ref 5–15)
BUN: 148 mg/dL — ABNORMAL HIGH (ref 8–23)
CO2: 20 mmol/L — ABNORMAL LOW (ref 22–32)
Calcium: 8.8 mg/dL — ABNORMAL LOW (ref 8.9–10.3)
Chloride: 116 mmol/L — ABNORMAL HIGH (ref 98–111)
Creatinine, Ser: 4.19 mg/dL — ABNORMAL HIGH (ref 0.44–1.00)
GFR, Estimated: 11 mL/min — ABNORMAL LOW (ref >60)
Glucose, Bld: 97 mg/dL (ref 70–99)
Phosphorus: 4.6 mg/dL (ref 2.5–4.6)
Potassium: 3.9 mmol/L (ref 3.5–5.1)
Sodium: 146 mmol/L — ABNORMAL HIGH (ref 135–145)

## 2020-11-27 MED ORDER — METHOCARBAMOL 500 MG PO TABS
500.0000 mg | ORAL_TABLET | Freq: Three times a day (TID) | ORAL | Status: DC | PRN
  Administered 2020-11-27 – 2020-11-28 (×2): 500 mg via ORAL
  Filled 2020-11-27 (×2): qty 1

## 2020-11-27 NOTE — Progress Notes (Signed)
PROGRESS NOTE    [name]  VWU:[id number] DOB: Aug 13, 1945 DOA: 11/19/2020 PCP: Hoyt Koch, MD   Brief Narrative:  75 y.o. female with medical history significant of pulmonary HTN; HTN; stage 4 CKD; s/p bioprosthetic AVR with AAA repair (05/2017)HFpEF,Chronic hypoxic respiratory failure on 2 L-3 L at all times, Severe OA, chronic lower extremity weakness-barely ambulatory at home-presented with worsening weakness/diarrhea-she was found to have AKI and subsequently admitted to the hospitalist service.  See below for further details.  Subjective:  Has some neck pain this morning.  But no nausea, vomiting.  Assessment & Plan: AKI on CKD stage IV: AKI hemodynamically mediated due to diarrhea/diuretic use.  Diarrhea has resolved.  Renal function slowly improving-continue to avoid nephrotoxic agents.  Volume status remains stable-she is off all IVF.    Normocytic anemia: Hemoglobin stable-no overt GI bleeding apparent even though she is FOBT positive.  Continue to follow CBC.  Suspect anemia is probably due to acute illness superimposed on anemia of CKD-required 2 units of PRBC so far.  Aranesp/IV iron defer to nephrology.  Diarrhea: Resolved with supportive care.  C. difficile studies and GI pathogen panel was negative.  Severe OA with chronic bilateral lower extremity weakness/ambulatory dysfunction/chronic back pain: Per patient-she was very minimally ambulatory at baseline (per H&P mostly wheelchair-bound-but just about able to transfer from bed to wheelchair)-suspect that lower extremity weakness is mostly chronic-and due to OA/degenerative spine disease/spinal stenosis.  Per patient-due to her significant comorbid medical conditions-her outpatient cardiologist has recommended against any surgical procedures.  She apparently follows with outpatient orthopedics for intra-articular steroid injections.  On exam today-she is easily able to move her legs side-to-side-and just about able to  raise them off the bed (against gravity)-palpation-this is close to her usual baseline.  Continue supportive care-continue PT/OT-with overall improvement in her renal function and improvement in her debility/weakness due to renal failure-hopefully her lower extremity weakness will improve somewhat.  Plans are for SNF on discharge  HTN: BP stable-continue amlodipine and Coreg  HFpEF: Volume status stable-avoid diuretics given AKI.  History of severe AS-s/p aortic valve replacement with bioprosthetic valve   History of repair of ascending aortic aneurysm  Chronic hypoxic respiratory failure: On 2-3 L of oxygen at home  Obesity: Body mass index is 31.74 kg/m.   Pressure ulcer: Pressure Injury 11/20/20 Upper;Lower;Left Stage 3 -  Full thickness tissue loss. Subcutaneous fat may be visible but bone, tendon or muscle are NOT exposed. (Active)  11/20/20   Location:   Location Orientation: Upper;Lower;Left  Staging: Stage 3 -  Full thickness tissue loss. Subcutaneous fat may be visible but bone, tendon or muscle are NOT exposed.  Wound Description (Comments):   Present on Admission: Yes     Pressure Injury 11/20/20 Right;Lower;Upper Unstageable - Full thickness tissue loss in which the base of the injury is covered by slough (yellow, tan, gray, green or brown) and/or eschar (tan, brown or black) in the wound bed. (Active)  11/20/20   Location:   Location Orientation: Right;Lower;Upper  Staging: Unstageable - Full thickness tissue loss in which the base of the injury is covered by slough (yellow, tan, gray, green or brown) and/or eschar (tan, brown or black) in the wound bed.  Wound Description (Comments):   Present on Admission: Yes     Pressure Injury 11/21/20 Thigh Left;Posterior;Proximal Unstageable - Full thickness tissue loss in which the base of the injury is covered by slough (yellow, tan, gray, green or brown) and/or eschar (tan, brown or  black) in the wound bed. (Active)  11/21/20  0800  Location: Thigh  Location Orientation: Left;Posterior;Proximal  Staging: Unstageable - Full thickness tissue loss in which the base of the injury is covered by slough (yellow, tan, gray, green or brown) and/or eschar (tan, brown or black) in the wound bed.  Wound Description (Comments):   Present on Admission: Yes     Pressure Injury 11/21/20 Buttocks Right Stage 2 -  Partial thickness loss of dermis presenting as a shallow open injury with a red, pink wound bed without slough. (Active)  11/21/20 0800  Location: Buttocks  Location Orientation: Right  Staging: Stage 2 -  Partial thickness loss of dermis presenting as a shallow open injury with a red, pink wound bed without slough.  Wound Description (Comments):   Present on Admission: Yes     Pressure Injury 11/21/20 Thigh Posterior;Proximal;Right Unstageable - Full thickness tissue loss in which the base of the injury is covered by slough (yellow, tan, gray, green or brown) and/or eschar (tan, brown or black) in the wound bed. (Active)  11/21/20 0800  Location: Thigh  Location Orientation: Posterior;Proximal;Right  Staging: Unstageable - Full thickness tissue loss in which the base of the injury is covered by slough (yellow, tan, gray, green or brown) and/or eschar (tan, brown or black) in the wound bed.  Wound Description (Comments):   Present on Admission: Yes   DVT prophylaxis: SCDs Code Status: FULL CODE  Family Communication: Spouse-[phone] to leave a voicemail on 8/19 Disposition Plan: SNF  Status is: Inpatient  Remains inpatient appropriate because:Unsafe d/c plan, IV treatments appropriate due to intensity of illness or inability to take PO, and Inpatient level of care appropriate due to severity of illness  Dispo: The patient is from: Home              Anticipated d/c is to:  TBD              Patient currently is not medically stable to d/c.   Difficult to place patient No  Consultants:   Nephrology   Procedures: None  Antimicrobials:  Anti-infectives (From admission, onward)    Start     Dose/Rate Route Frequency Ordered Stop   11/19/20 2057  cefTRIAXone (ROCEPHIN) 1 g in sodium chloride 0.9 % 100 mL IVPB        1 g 200 mL/hr over 30 Minutes Intravenous  Once 11/19/20 1847 11/19/20 2200        Objective: Vitals:   11/27/20 0000 11/27/20 0400 11/27/20 0742 11/27/20 1207  BP: 126/60 130/62 (!) 123/55 120/60  Pulse: 71 70 68 66  Resp: 18 18 16 17   Temp: 98 F (36.7 C) 98.4 F (36.9 C) 97.8 F (36.6 C) 97.8 F (36.6 C)  TempSrc: Oral Oral Oral Oral  SpO2: 100% 100% 99% 98%  Weight:      Height:        Intake/Output Summary (Last 24 hours) at 11/27/2020 1339 Last data filed at 11/27/2020 1108 Gross per 24 hour  Intake 200 ml  Output 650 ml  Net -450 ml    Filed Weights   11/19/20 1217 11/20/20 2327  Weight: 77.1 kg 89.2 kg   Examination: Gen Exam:Alert awake-not in any distress HEENT:atraumatic, normocephalic Chest: B/L clear to auscultation anteriorly CVS:S1S2 regular Abdomen:soft non tender, non distended Extremities:no edema Neurology: Moves lower extremities side-by-side-just about able to raise them off the bed against gravity (per patient this is her baseline) Skin: no rash   Data Reviewed:  I have personally reviewed following labs and imaging studies  CBC: Recent Labs  Lab 11/21/20 1908 11/22/20 0019 11/23/20 0357 11/24/20 0052 11/26/20 0107 11/27/20 0338  WBC 13.5* 13.8* 14.0* 16.1* 12.1* 12.2*  NEUTROABS 11.6* 11.6*  --   --   --   --   HGB 6.9* 7.8* 8.1* 8.4* 7.5* 7.7*  HCT 22.0* 25.1* 25.7* 27.0* 24.3* 25.5*  MCV 93.2 94.0 92.8 94.1 96.0 97.7  PLT 290 309 337 359 296 035    Basic Metabolic Panel: Recent Labs  Lab 11/22/20 0019 11/23/20 0357 11/24/20 0052 11/25/20 0058 11/26/20 0107 11/27/20 0338  NA 143 143 144 140 146* 146*  K 4.1 3.8 3.7 3.7 3.7 3.9  CL 107 105 110 110 115* 116*  CO2 20* 23 22 19* 20* 20*   GLUCOSE 90 117* 93 86 98 97  BUN 180* 169* 166* 165* 157* 148*  CREATININE 5.06* 4.99* 4.85* 4.55* 4.32* 4.19*  CALCIUM 9.2 9.7 9.4 9.0 8.8* 8.8*  MG 2.1  --   --   --   --   --   PHOS 5.7* 5.6* 5.5* 5.1* 4.6 4.6    GFR: Estimated Creatinine Clearance: 13.1 mL/min (A) (by C-G formula based on SCr of 4.19 mg/dL (H)). Liver Function Tests: Recent Labs  Lab 11/22/20 0019 11/23/20 0357 11/24/20 0052 11/25/20 0058 11/26/20 0107 11/27/20 0338  AST 22  --   --   --   --   --   ALT 21  --   --   --   --   --   ALKPHOS 54  --   --   --   --   --   BILITOT 1.4*  --   --   --   --   --   PROT 6.6  --   --   --   --   --   ALBUMIN 3.6 3.1* 3.1* 2.8* 2.6* 2.7*    No results for input(s): LIPASE, AMYLASE in the last 168 hours. No results for input(s): AMMONIA in the last 168 hours. Coagulation Profile: No results for input(s): INR, PROTIME in the last 168 hours. Cardiac Enzymes: Recent Labs  Lab 11/20/20 1557  CKTOTAL 147    BNP (last 3 results) No results for input(s): PROBNP in the last 8760 hours. HbA1C: No results for input(s): HGBA1C in the last 72 hours. CBG: No results for input(s): GLUCAP in the last 168 hours. Lipid Profile: No results for input(s): CHOL, HDL, LDLCALC, TRIG, CHOLHDL, LDLDIRECT in the last 72 hours. Thyroid Function Tests: No results for input(s): TSH, T4TOTAL, FREET4, T3FREE, THYROIDAB in the last 72 hours. Anemia Panel: No results for input(s): VITAMINB12, FOLATE, FERRITIN, TIBC, IRON, RETICCTPCT in the last 72 hours.  Sepsis Labs: No results for input(s): PROCALCITON, LATICACIDVEN in the last 168 hours.   Recent Results (from the past 240 hour(s))  Resp Panel by RT-PCR (Flu A&B, Covid) Nasopharyngeal Swab     Status: None   Collection Time: 11/19/20  5:19 PM   Specimen: Nasopharyngeal Swab; Nasopharyngeal(NP) swabs in vial transport medium  Result Value Ref Range Status   SARS Coronavirus 2 by RT PCR NEGATIVE NEGATIVE Final    Comment:  (NOTE) SARS-CoV-2 target nucleic acids are NOT DETECTED.  The SARS-CoV-2 RNA is generally detectable in upper respiratory specimens during the acute phase of infection. The lowest concentration of SARS-CoV-2 viral copies this assay can detect is 138 copies/mL. A negative result does not preclude SARS-Cov-2 infection and should  not be used as the sole basis for treatment or other patient management decisions. A negative result may occur with  improper specimen collection/handling, submission of specimen other than nasopharyngeal swab, presence of viral mutation(s) within the areas targeted by this assay, and inadequate number of viral copies(<138 copies/mL). A negative result must be combined with clinical observations, patient history, and epidemiological information. The expected result is Negative.  Fact Sheet for Patients:  EntrepreneurPulse.com.au  Fact Sheet for Healthcare Providers:  IncredibleEmployment.be  This test is no t yet approved or cleared by the [place] FDA and  has been authorized for detection and/or diagnosis of SARS-CoV-2 by FDA under an Emergency Use Authorization (EUA). This EUA will remain  in effect (meaning this test can be used) for the duration of the COVID-19 declaration under Section 564(b)(1) of the Act, 21 U.S.C.section 360bbb-3(b)(1), unless the authorization is terminated  or revoked sooner.       Influenza A by PCR NEGATIVE NEGATIVE Final   Influenza B by PCR NEGATIVE NEGATIVE Final    Comment: (NOTE) The Xpert Xpress SARS-CoV-2/FLU/RSV plus assay is intended as an aid in the diagnosis of influenza from Nasopharyngeal swab specimens and should not be used as a sole basis for treatment. Nasal washings and aspirates are unacceptable for Xpert Xpress SARS-CoV-2/FLU/RSV testing.  Fact Sheet for Patients: EntrepreneurPulse.com.au  Fact Sheet for Healthcare  Providers: IncredibleEmployment.be  This test is not yet approved or cleared by the [place] FDA and has been authorized for detection and/or diagnosis of SARS-CoV-2 by FDA under an Emergency Use Authorization (EUA). This EUA will remain in effect (meaning this test can be used) for the duration of the COVID-19 declaration under Section 564(b)(1) of the Act, 21 U.S.C. section 360bbb-3(b)(1), unless the authorization is terminated or revoked.  Performed at [facility], [place] [address], [place], [place] [address]   Gastrointestinal Panel by PCR , Stool     Status: None   Collection Time: 11/20/20  6:48 PM   Specimen: Stool  Result Value Ref Range Status   Campylobacter species NOT DETECTED NOT DETECTED Final   Plesimonas shigelloides NOT DETECTED NOT DETECTED Final   Salmonella species NOT DETECTED NOT DETECTED Final   Yersinia enterocolitica NOT DETECTED NOT DETECTED Final   Vibrio species NOT DETECTED NOT DETECTED Final   Vibrio cholerae NOT DETECTED NOT DETECTED Final   Enteroaggregative E coli (EAEC) NOT DETECTED NOT DETECTED Final   Enteropathogenic E coli (EPEC) NOT DETECTED NOT DETECTED Final   Enterotoxigenic E coli (ETEC) NOT DETECTED NOT DETECTED Final   Shiga like toxin producing E coli (STEC) NOT DETECTED NOT DETECTED Final   Shigella/Enteroinvasive E coli (EIEC) NOT DETECTED NOT DETECTED Final   Cryptosporidium NOT DETECTED NOT DETECTED Final   Cyclospora cayetanensis NOT DETECTED NOT DETECTED Final   Entamoeba histolytica NOT DETECTED NOT DETECTED Final   Giardia lamblia NOT DETECTED NOT DETECTED Final   Adenovirus F40/41 NOT DETECTED NOT DETECTED Final   Astrovirus NOT DETECTED NOT DETECTED Final   Norovirus GI/GII NOT DETECTED NOT DETECTED Final   Rotavirus A NOT DETECTED NOT DETECTED Final   Sapovirus (I, II, IV, and V) NOT DETECTED NOT DETECTED Final    Comment: Performed at [facility], [address] [place], [place]  [address]  C Difficile Quick Screen w PCR reflex     Status: None   Collection Time: 11/20/20  6:48 PM   Specimen: STOOL  Result Value Ref Range Status   C Diff  antigen NEGATIVE NEGATIVE Final   C Diff toxin NEGATIVE NEGATIVE Final   C Diff interpretation No C. difficile detected.  Final    Comment: Performed at [facility], [place] [address], [place],  [address]    RN Pressure Injury Documentation: Pressure Injury 11/20/20 Upper;Lower;Left Stage 3 -  Full thickness tissue loss. Subcutaneous fat may be visible but bone, tendon or muscle are NOT exposed. (Active)  11/20/20   Location:   Location Orientation: Upper;Lower;Left  Staging: Stage 3 -  Full thickness tissue loss. Subcutaneous fat may be visible but bone, tendon or muscle are NOT exposed.  Wound Description (Comments):   Present on Admission: Yes     Pressure Injury 11/20/20 Right;Lower;Upper Unstageable - Full thickness tissue loss in which the base of the injury is covered by slough (yellow, tan, gray, green or brown) and/or eschar (tan, brown or black) in the wound bed. (Active)  11/20/20   Location:   Location Orientation: Right;Lower;Upper  Staging: Unstageable - Full thickness tissue loss in which the base of the injury is covered by slough (yellow, tan, gray, green or brown) and/or eschar (tan, brown or black) in the wound bed.  Wound Description (Comments):   Present on Admission: Yes     Pressure Injury 11/21/20 Thigh Left;Posterior;Proximal Unstageable - Full thickness tissue loss in which the base of the injury is covered by slough (yellow, tan, gray, green or brown) and/or eschar (tan, brown or black) in the wound bed. (Active)  11/21/20 0800  Location: Thigh  Location Orientation: Left;Posterior;Proximal  Staging: Unstageable - Full thickness tissue loss in which the base of the injury is covered by slough (yellow, tan, gray, green or brown) and/or eschar (tan, brown or black) in the wound bed.  Wound  Description (Comments):   Present on Admission: Yes     Pressure Injury 11/21/20 Buttocks Right Stage 2 -  Partial thickness loss of dermis presenting as a shallow open injury with a red, pink wound bed without slough. (Active)  11/21/20 0800  Location: Buttocks  Location Orientation: Right  Staging: Stage 2 -  Partial thickness loss of dermis presenting as a shallow open injury with a red, pink wound bed without slough.  Wound Description (Comments):   Present on Admission: Yes     Pressure Injury 11/21/20 Thigh Posterior;Proximal;Right Unstageable - Full thickness tissue loss in which the base of the injury is covered by slough (yellow, tan, gray, green or brown) and/or eschar (tan, brown or black) in the wound bed. (Active)  11/21/20 0800  Location: Thigh  Location Orientation: Posterior;Proximal;Right  Staging: Unstageable - Full thickness tissue loss in which the base of the injury is covered by slough (yellow, tan, gray, green or brown) and/or eschar (tan, brown or black) in the wound bed.  Wound Description (Comments):   Present on Admission: Yes     Estimated body mass index is 31.74 kg/m as calculated from the following:   Height as of this encounter: 5\' 6"  (1.676 m).   Weight as of this encounter: 89.2 kg.  Malnutrition Type:   Malnutrition Characteristics:   Nutrition Interventions:    Radiology Studies: No results found.  Scheduled Meds:  amLODipine  5 mg Oral QHS   aspirin EC  81 mg Oral Daily   calcitRIOL  0.25 mcg Oral Once per day on Mon Fri   capsicum   Topical BID   carvedilol  25 mg Oral BID WC   collagenase   Topical Daily   [  START ON 11/28/2020] darbepoetin (ARANESP) injection - NON-DIALYSIS  100 mcg Subcutaneous Q Tue-1800   DULoxetine  30 mg Oral Daily   gabapentin  100 mg Oral QHS   heparin injection (subcutaneous)  5,000 Units Subcutaneous Q12H   multivitamin with minerals  1 tablet Oral Daily   pantoprazole  40 mg Oral Q0600   Continuous  Infusions:    LOS: 8 days   Oren Binet, DO [facility] PAGER is on AMION  If 7PM-7AM, please contact night-coverage [email or web address]

## 2020-11-27 NOTE — Progress Notes (Signed)
[facility] NEPHROLOGY PROGRESS NOTE  Assessment/ Plan:  #  Acute kidney injury on chronic kidney disease stage IV, nonoliguric: Baseline creatinine level seems to be around 2.4-2.6 and was followed by Dr.Upton.  AKI presumably hemodynamically mediated in the setting of diarrhea and may have some progressive underlying CKD. BUN very high concern for possible bleeding.  Oliguric urine output.  Slow but gradual improvement of creatinine level.  NO sig uremic Sx at this time.  She does not want RRT if indicated. Seen by palliative care team as well. Fortunately there is no urgent indication for dialysis.  Holding IVFs, orally hydrating   # Hypokalemia: Secondary to poor intake/diarrhea-corrected with oral replacement.  # Chronic hypoxic respiratory failure: She appears to be at baseline with regards to her respiratory status on oxygen supplementation at 2 L/min via nasal cannula.  # Anemia of chronic kidney disease/chronic illness: With iron deficiency and associated anemia, started on iron supplementation/ESA.  Seen by gastroenterology with concerns of positive FOBT (entirely possible that elevated BUN may in part be from GI bleed). Per TR[facility]/GI  #  Chronic cervical spine radiculopathy with chronic back pain with acute on chronic ambulation dysfunction: Suspect that with her limitations and current social support, she will unlikely be able to return home and will need placement at a skilled nursing facility.  Discussed with the primary team.   Subjective:  No interval events, 0.7L UOP Slow improvement in BUN and SCr Tol some PO, about to eat lunch  Objective Vital signs in last 24 hours: Vitals:   11/27/20 0000 11/27/20 0400 11/27/20 0742 11/27/20 1207  BP: 126/60 130/62 (!) 123/55 120/60  Pulse: 71 70 68 66  Resp: 18 18 16 17   Temp: 98 F (36.7 C) 98.4 F (36.9 C) 97.8 F (36.6 C) 97.8 F (36.6 C)  TempSrc: Oral Oral Oral Oral  SpO2: 100% 100% 99% 98%  Weight:       Height:       Weight change:   Intake/Output Summary (Last 24 hours) at 11/27/2020 1502 Last data filed at 11/27/2020 1108 Gross per 24 hour  Intake 200 ml  Output 650 ml  Net -450 ml        Labs: Basic Metabolic Panel: Recent Labs  Lab 11/25/20 0058 11/26/20 0107 11/27/20 0338  NA 140 146* 146*  K 3.7 3.7 3.9  CL 110 115* 116*  CO2 19* 20* 20*  GLUCOSE 86 98 97  BUN 165* 157* 148*  CREATININE 4.55* 4.32* 4.19*  CALCIUM 9.0 8.8* 8.8*  PHOS 5.1* 4.6 4.6    Liver Function Tests: Recent Labs  Lab 11/22/20 0019 11/23/20 0357 11/25/20 0058 11/26/20 0107 11/27/20 0338  AST 22  --   --   --   --   ALT 21  --   --   --   --   ALKPHOS 54  --   --   --   --   BILITOT 1.4*  --   --   --   --   PROT 6.6  --   --   --   --   ALBUMIN 3.6   < > 2.8* 2.6* 2.7*   < > = values in this interval not displayed.    No results for input(s): LIPASE, AMYLASE in the last 168 hours. No results for input(s): AMMONIA in the last 168 hours. CBC: Recent Labs  Lab 11/21/20 1908 11/22/20 0019 11/23/20 0357 11/24/20 0052 11/26/20 0107 11/27/20 0338  WBC 13.5*  13.8* 14.0* 16.1* 12.1* 12.2*  NEUTROABS 11.6* 11.6*  --   --   --   --   HGB 6.9* 7.8* 8.1* 8.4* 7.5* 7.7*  HCT 22.0* 25.1* 25.7* 27.0* 24.3* 25.5*  MCV 93.2 94.0 92.8 94.1 96.0 97.7  PLT 290 309 337 359 296 288    Cardiac Enzymes: Recent Labs  Lab 11/20/20 1557  CKTOTAL 147    CBG: No results for input(s): GLUCAP in the last 168 hours.  Iron Studies: No results for input(s): IRON, TIBC, TRANSFERRIN, FERRITIN in the last 72 hours. Studies/Results: No results found.  Medications: Infusions:    Scheduled Medications:  amLODipine  5 mg Oral QHS   aspirin EC  81 mg Oral Daily   calcitRIOL  0.25 mcg Oral Once per day on Mon Fri   capsicum   Topical BID   carvedilol  25 mg Oral BID WC   collagenase   Topical Daily   [START ON 11/28/2020] darbepoetin (ARANESP) injection - NON-DIALYSIS  100 mcg Subcutaneous  Q Tue-1800   DULoxetine  30 mg Oral Daily   gabapentin  100 mg Oral QHS   heparin injection (subcutaneous)  5,000 Units Subcutaneous Q12H   multivitamin with minerals  1 tablet Oral Daily   pantoprazole  40 mg Oral Q0600    have reviewed scheduled and prn medications.  Physical Exam: General: Not in distress Heart:RRR, s1s2 nl Lungs: Distant breath sound anteriorly, clear, no wheeze or crackles Abdomen:soft, Non-tender, non-distended Extremities:No LE edema Neurology: Alert, awake and following commands, no asterixis  Rexene Agent 11/27/2020,3:02 PM  LOS: 8 days

## 2020-11-28 LAB — RENAL FUNCTION PANEL
Albumin: 2.7 g/dL — ABNORMAL LOW (ref 3.5–5.0)
Anion gap: 9 (ref 5–15)
BUN: 142 mg/dL — ABNORMAL HIGH (ref 8–23)
CO2: 21 mmol/L — ABNORMAL LOW (ref 22–32)
Calcium: 8.9 mg/dL (ref 8.9–10.3)
Chloride: 116 mmol/L — ABNORMAL HIGH (ref 98–111)
Creatinine, Ser: 4.05 mg/dL — ABNORMAL HIGH (ref 0.44–1.00)
GFR, Estimated: 11 mL/min — ABNORMAL LOW (ref >60)
Glucose, Bld: 121 mg/dL — ABNORMAL HIGH (ref 70–99)
Phosphorus: 4.7 mg/dL — ABNORMAL HIGH (ref 2.5–4.6)
Potassium: 4 mmol/L (ref 3.5–5.1)
Sodium: 146 mmol/L — ABNORMAL HIGH (ref 135–145)

## 2020-11-28 NOTE — Progress Notes (Signed)
Physical Therapy Treatment Patient Details Name: [name] MRN: [id number] DOB: 1945-06-26 Today's Date: 11/28/2020    History of Present Illness Pt is a 75yo female presenting to [facility] ED on 8/14 with neck and bilteral lower extremity pain secondary to a fall at home while using BSC. Xray revealed chronic osteoarthritis of both knees present in previous imaging. Current diagnosis of acute kidney injury. Pt on 2-3L of O2 24/7 at home. PMH: anemia, CKD4, CHF, HTN, gout, aortic stenosis.    PT Comments    Patient very limited by bil knee pain with LE exercises. Once seated EOB, able to tolerate min LE exercises before she reported her back felt weak and was hurting and wanted to lie down. Convinced pt to do another 30 seconds (total time up 4 minutes) and then returned to supine. Pt currently requires lift equipment for OOB transfers [facility]).     Follow Up Recommendations  SNF;Supervision/Assistance - 24 hour     Equipment Recommendations  Rolling walker with 5" wheels;3in1 (PT);Wheelchair (measurements PT);Wheelchair cushion (measurements PT);Hospital bed (bariatric 3n1)    Recommendations for Other Services       Precautions / Restrictions Precautions Precautions: Fall Precaution Comments: Fell at home while on [facility]    Mobility  Bed Mobility Overal bed mobility: Needs Assistance Bed Mobility: Rolling;Sidelying to Sit;Sit to Sidelying Rolling: Max assist (with rail; pt able to assist once her hand was on rail) Sidelying to sit: Total assist;+2 for physical assistance;HOB elevated     Sit to sidelying: Total assist;+2 for physical assistance General bed mobility comments: total assist for repositioning in bed. .    Transfers                 General transfer comment: unsafe to attempt due to leg weakness and level of pain in bil knees; pt attempted to use bil UEs to scoot her left hip forward toward EOB with pt unable  Ambulation/Gait                  Stairs             Wheelchair Mobility    Modified Rankin (Stroke Patients Only)       Balance Overall balance assessment: History of Falls;Needs assistance Sitting-balance support: Bilateral upper extremity supported Sitting balance-Leahy Scale: Poor Sitting balance - Comments: initial posterior lean but progressed to holding her balance without external assist x 3 minutes (+1 minute with support)                                    Cognition Arousal/Alertness: Awake/alert Behavior During Therapy: WFL for tasks assessed/performed Overall Cognitive Status: No family/caregiver present to determine baseline cognitive functioning                                 General Comments: Calm and cooperative. Following commands.      Exercises General Exercises - Lower Extremity Quad Sets: AROM;Both;5 reps;Supine Long Arc Quad: AROM;Both;5 reps;Seated Heel Slides: AAROM;Right;Left;5 reps;Supine    General Comments        Pertinent Vitals/Pain Pain Assessment: Faces Faces Pain Scale: Hurts whole lot Pain Location: BLE Pain Descriptors / Indicators: Grimacing;Guarding Pain Intervention(s): Limited activity within patient's tolerance;Monitored during session;Repositioned    Home Living  Prior Function            PT Goals (current goals can now be found in the care plan section) Acute Rehab PT Goals Patient Stated Goal: not stated Time For Goal Achievement: 11/21/2020 Potential to Achieve Goals: Fair Progress towards PT goals: Progressing toward goals    Frequency    Min 2X/week      PT Plan Current plan remains appropriate    Co-evaluation              AM-PAC PT "6 Clicks" Mobility   Outcome Measure  Help needed turning from your back to your side while in a flat bed without using bedrails?: Total Help needed moving from lying on your back to sitting on the side of a flat bed without using  bedrails?: Total Help needed moving to and from a bed to a chair (including a wheelchair)?: Total Help needed standing up from a chair using your arms (e.g., wheelchair or bedside chair)?: Total Help needed to walk in hospital room?: Total Help needed climbing 3-5 steps with a railing? : Total 6 Click Score: 6    End of Session Equipment Utilized During Treatment: Oxygen (2L) Activity Tolerance: Patient limited by pain Patient left: in bed;with call bell/phone within reach;with bed alarm set Nurse Communication: Mobility status PT Visit Diagnosis: Muscle weakness (generalized) (M62.81);History of falling (Z91.81);Pain;Other abnormalities of gait and mobility (R26.89) Pain - Right/Left:  (bilateral) Pain - part of body: Knee;Leg     Time: 6568-1275 PT Time Calculation (min) (ACUTE ONLY): 28 min  Charges:  $Therapeutic Exercise: 8-22 mins $Therapeutic Activity: 8-22 mins                      Arby Barrette, PT Pager [phone]    Rexanne Mano 11/28/2020, 2:07 PM

## 2020-11-28 NOTE — Progress Notes (Signed)
[facility] NEPHROLOGY PROGRESS NOTE  Assessment/ Plan:  #  Acute kidney injury on chronic kidney disease stage IV, nonoliguric: Baseline creatinine level seems to be around 2.4-2.6 and was followed by Dr.Upton.  AKI presumably hemodynamically mediated in the setting of diarrhea and may have some progressive underlying CKD. BUN very high concern for possible bleeding.  Oliguric urine output.  Slow but gradual improvement of creatinine and BUN.  NO sig uremic Sx at this time.  She does not want RRT if indicated. Seen by palliative care team as well. Fortunately there is no urgent indication for dialysis.  Holding IVFs, orally hydrating   # Hypokalemia: resolved  # Chronic hypoxic respiratory failure: She appears to be at baseline with regards to her respiratory status on oxygen supplementation at 2 L/min via nasal cannula.  # Anemia of chronic kidney disease/chronic illness: With iron deficiency and associated anemia, started on iron supplementation/ESA.  Seen by gastroenterology with concerns of positive FOBT (entirely possible that elevated BUN may in part be from GI bleed). Per TRH/GI  #  Chronic cervical spine radiculopathy with chronic back pain with acute on chronic ambulation dysfunction: Suspect that with her limitations and current social support, she will unlikely be able to return home and will need placement at a skilled nursing facility.  Discussed with the primary team.   Subjective:  No interval events, 0.7L UOP Ongoing slow improvement in BUN and SCr Not sure she's eating well  Objective Vital signs in last 24 hours: Vitals:   11/28/20 0000 11/28/20 0400 11/28/20 0736 11/28/20 1149  BP: (!) 124/57 122/61 (!) 126/59 127/60  Pulse: 73 76 79 79  Resp: 18 18 17 19   Temp: 98.9 F (37.2 C) 98.7 F (37.1 C) 97.8 F (36.6 C) 98.9 F (37.2 C)  TempSrc: Oral Oral Oral Oral  SpO2: 98% 97% 95% 97%  Weight:      Height:       Weight change:   Intake/Output  Summary (Last 24 hours) at 11/28/2020 1447 Last data filed at 11/28/2020 1150 Gross per 24 hour  Intake 600 ml  Output 650 ml  Net -50 ml        Labs: Basic Metabolic Panel: Recent Labs  Lab 11/26/20 0107 11/27/20 0338 11/28/20 0024  NA 146* 146* 146*  K 3.7 3.9 4.0  CL 115* 116* 116*  CO2 20* 20* 21*  GLUCOSE 98 97 121*  BUN 157* 148* 142*  CREATININE 4.32* 4.19* 4.05*  CALCIUM 8.8* 8.8* 8.9  PHOS 4.6 4.6 4.7*    Liver Function Tests: Recent Labs  Lab 11/22/20 0019 11/23/20 0357 11/26/20 0107 11/27/20 0338 11/28/20 0024  AST 22  --   --   --   --   ALT 21  --   --   --   --   ALKPHOS 54  --   --   --   --   BILITOT 1.4*  --   --   --   --   PROT 6.6  --   --   --   --   ALBUMIN 3.6   < > 2.6* 2.7* 2.7*   < > = values in this interval not displayed.    No results for input(s): LIPASE, AMYLASE in the last 168 hours. No results for input(s): AMMONIA in the last 168 hours. CBC: Recent Labs  Lab 11/21/20 1908 11/22/20 0019 11/23/20 0357 11/24/20 0052 11/26/20 0107 11/27/20 0338  WBC 13.5* 13.8* 14.0* 16.1* 12.1* 12.2*  NEUTROABS 11.6* 11.6*  --   --   --   --   HGB 6.9* 7.8* 8.1* 8.4* 7.5* 7.7*  HCT 22.0* 25.1* 25.7* 27.0* 24.3* 25.5*  MCV 93.2 94.0 92.8 94.1 96.0 97.7  PLT 290 309 337 359 296 288    Cardiac Enzymes: No results for input(s): CKTOTAL, CKMB, CKMBINDEX, TROPONINI in the last 168 hours.  CBG: No results for input(s): GLUCAP in the last 168 hours.  Iron Studies: No results for input(s): IRON, TIBC, TRANSFERRIN, FERRITIN in the last 72 hours. Studies/Results: No results found.  Medications: Infusions:    Scheduled Medications:  amLODipine  5 mg Oral QHS   aspirin EC  81 mg Oral Daily   calcitRIOL  0.25 mcg Oral Once per day on Mon Fri   capsicum   Topical BID   carvedilol  25 mg Oral BID WC   collagenase   Topical Daily   darbepoetin (ARANESP) injection - NON-DIALYSIS  100 mcg Subcutaneous Q Tue-1800   DULoxetine  30 mg  Oral Daily   gabapentin  100 mg Oral QHS   heparin injection (subcutaneous)  5,000 Units Subcutaneous Q12H   multivitamin with minerals  1 tablet Oral Daily   pantoprazole  40 mg Oral Q0600    have reviewed scheduled and prn medications.  Physical Exam: General: Not in distress Heart:RRR, s1s2 nl Lungs: Distant breath sound anteriorly, clear, no wheeze or crackles Abdomen:soft, Non-tender, non-distended Extremities:No LE edema Neurology: Alert, awake and following commands, no asterixis  [name] 11/28/2020,2:47 PM  LOS: 9 days

## 2020-11-28 NOTE — Progress Notes (Signed)
PROGRESS NOTE    [name]  EXB:[id number] DOB: 06-Jul-1945 DOA: 11/19/2020 PCP: Hoyt Koch, MD   Brief Narrative:  75 y.o. female with medical history significant of pulmonary HTN; HTN; stage 4 CKD; s/p bioprosthetic AVR with AAA repair (05/2017)HFpEF,Chronic hypoxic respiratory failure on 2 L-3 L at all times, Severe OA, chronic lower extremity weakness-barely ambulatory at home-presented with worsening weakness/diarrhea-she was found to have AKI and subsequently admitted to the hospitalist service.  See below for further details.  Subjective:  Continues to slowly improve-no nausea or vomiting.  Claims she is eating significant portion of her meals.  Assessment & Plan: AKI on CKD stage IV: AKI hemodynamically mediated-felt to be due to diarrhea and diuretic use.  Thankfully improving with just supportive.  No longer on IVF for the past several days.  Continue to avoid nephrotoxic agents.  Suspect that if clinical improvement continues-can be discharged to SNF soon.  Appreciate nephrology input.  Normocytic anemia: Hemoglobin stable-no overt GI bleeding apparent even though she is FOBT positive.  Continue to follow CBC.  Suspect anemia is probably due to acute illness superimposed on anemia of CKD-required 2 units of PRBC so far.  Aranesp/IV iron defer to nephrology.  Diarrhea: Resolved with supportive care.  C. difficile studies and GI pathogen panel was negative.  Severe OA with chronic bilateral lower extremity weakness/ambulatory dysfunction/chronic back pain: Per patient-she was very minimally ambulatory at baseline (per H&P mostly wheelchair-bound-but just about able to transfer from bed to wheelchair)-suspect that lower extremity weakness is mostly chronic-and due to OA/degenerative spine disease/spinal stenosis.  Per patient-due to her significant comorbid medical conditions-her outpatient cardiologist has recommended against any surgical procedures.  She apparently follows  with outpatient orthopedics for intra-articular steroid injections.  On exam today-she is easily able to move her legs side-to-side-and just about able to raise them off the bed (against gravity)-palpation-this is close to her usual baseline.  Continue supportive care-continue PT/OT-with overall improvement in her renal function and improvement in her debility/weakness due to renal failure-hopefully her lower extremity weakness will improve somewhat.  Plans are for SNF on discharge  HTN: BP stable-continue amlodipine and Coreg  HFpEF: Volume status stable-avoid diuretics given AKI.  History of severe AS-s/p aortic valve replacement with bioprosthetic valve   History of repair of ascending aortic aneurysm  Chronic hypoxic respiratory failure: On 2-3 L of oxygen at home  Obesity: Body mass index is 31.74 kg/m.   Pressure ulcer: Pressure Injury 11/20/20 Upper;Lower;Left Stage 3 -  Full thickness tissue loss. Subcutaneous fat may be visible but bone, tendon or muscle are NOT exposed. (Active)  11/20/20   Location:   Location Orientation: Upper;Lower;Left  Staging: Stage 3 -  Full thickness tissue loss. Subcutaneous fat may be visible but bone, tendon or muscle are NOT exposed.  Wound Description (Comments):   Present on Admission: Yes     Pressure Injury 11/20/20 Right;Lower;Upper Unstageable - Full thickness tissue loss in which the base of the injury is covered by slough (yellow, tan, gray, green or brown) and/or eschar (tan, brown or black) in the wound bed. (Active)  11/20/20   Location:   Location Orientation: Right;Lower;Upper  Staging: Unstageable - Full thickness tissue loss in which the base of the injury is covered by slough (yellow, tan, gray, green or brown) and/or eschar (tan, brown or black) in the wound bed.  Wound Description (Comments):   Present on Admission: Yes     Pressure Injury 11/21/20 Thigh Left;Posterior;Proximal Unstageable - Full thickness  tissue loss in which  the base of the injury is covered by slough (yellow, tan, gray, green or brown) and/or eschar (tan, brown or black) in the wound bed. (Active)  11/21/20 0800  Location: Thigh  Location Orientation: Left;Posterior;Proximal  Staging: Unstageable - Full thickness tissue loss in which the base of the injury is covered by slough (yellow, tan, gray, green or brown) and/or eschar (tan, brown or black) in the wound bed.  Wound Description (Comments):   Present on Admission: Yes     Pressure Injury 11/21/20 Buttocks Right Stage 2 -  Partial thickness loss of dermis presenting as a shallow open injury with a red, pink wound bed without slough. (Active)  11/21/20 0800  Location: Buttocks  Location Orientation: Right  Staging: Stage 2 -  Partial thickness loss of dermis presenting as a shallow open injury with a red, pink wound bed without slough.  Wound Description (Comments):   Present on Admission: Yes     Pressure Injury 11/21/20 Thigh Posterior;Proximal;Right Unstageable - Full thickness tissue loss in which the base of the injury is covered by slough (yellow, tan, gray, green or brown) and/or eschar (tan, brown or black) in the wound bed. (Active)  11/21/20 0800  Location: Thigh  Location Orientation: Posterior;Proximal;Right  Staging: Unstageable - Full thickness tissue loss in which the base of the injury is covered by slough (yellow, tan, gray, green or brown) and/or eschar (tan, brown or black) in the wound bed.  Wound Description (Comments):   Present on Admission: Yes   DVT prophylaxis: SCDs Code Status: FULL CODE  Family Communication: Spouse-Charles-[phone]-unable to leave a voicemail on 8/19 Disposition Plan: SNF  Status is: Inpatient  Remains inpatient appropriate because:Unsafe d/c plan, IV treatments appropriate due to intensity of illness or inability to take PO, and Inpatient level of care appropriate due to severity of illness  Dispo: The patient is from: Home               Anticipated d/c is to:  TBD              Patient currently is not medically stable to d/c.   Difficult to place patient No  Consultants:  Nephrology   Procedures: None  Antimicrobials:  Anti-infectives (From admission, onward)    Start     Dose/Rate Route Frequency Ordered Stop   11/19/20 2057  cefTRIAXone (ROCEPHIN) 1 g in sodium chloride 0.9 % 100 mL IVPB        1 g 200 mL/hr over 30 Minutes Intravenous  Once 11/19/20 1847 11/19/20 2200        Objective: Vitals:   11/28/20 0000 11/28/20 0400 11/28/20 0736 11/28/20 1149  BP: (!) 124/57 122/61 (!) 126/59 127/60  Pulse: 73 76 79 79  Resp: 18 18 17 19   Temp: 98.9 F (37.2 C) 98.7 F (37.1 C) 97.8 F (36.6 C) 98.9 F (37.2 C)  TempSrc: Oral Oral Oral Oral  SpO2: 98% 97% 95% 97%  Weight:      Height:        Intake/Output Summary (Last 24 hours) at 11/28/2020 1341 Last data filed at 11/28/2020 1150 Gross per 24 hour  Intake 600 ml  Output 650 ml  Net -50 ml    Filed Weights   11/19/20 1217 11/20/20 2327  Weight: 77.1 kg 89.2 kg   Examination: Gen Exam:Alert awake-not in any distress HEENT:atraumatic, normocephalic Chest: B/L clear to auscultation anteriorly CVS:S1S2 regular Abdomen:soft non tender, non distended Extremities:no edema Neurology: Unchanged-able to  move leg side-by-side, just about able to raise it off the bed (this is at baseline per patient) Skin: no rash   Data Reviewed: I have personally reviewed following labs and imaging studies  CBC: Recent Labs  Lab 11/21/20 1908 11/22/20 0019 11/23/20 0357 11/24/20 0052 11/26/20 0107 11/27/20 0338  WBC 13.5* 13.8* 14.0* 16.1* 12.1* 12.2*  NEUTROABS 11.6* 11.6*  --   --   --   --   HGB 6.9* 7.8* 8.1* 8.4* 7.5* 7.7*  HCT 22.0* 25.1* 25.7* 27.0* 24.3* 25.5*  MCV 93.2 94.0 92.8 94.1 96.0 97.7  PLT 290 309 337 359 296 580    Basic Metabolic Panel: Recent Labs  Lab 11/22/20 0019 11/23/20 0357 11/24/20 0052 11/25/20 0058 11/26/20 0107  11/27/20 0338 11/28/20 0024  NA 143   < > 144 140 146* 146* 146*  K 4.1   < > 3.7 3.7 3.7 3.9 4.0  CL 107   < > 110 110 115* 116* 116*  CO2 20*   < > 22 19* 20* 20* 21*  GLUCOSE 90   < > 93 86 98 97 121*  BUN 180*   < > 166* 165* 157* 148* 142*  CREATININE 5.06*   < > 4.85* 4.55* 4.32* 4.19* 4.05*  CALCIUM 9.2   < > 9.4 9.0 8.8* 8.8* 8.9  MG 2.1  --   --   --   --   --   --   PHOS 5.7*   < > 5.5* 5.1* 4.6 4.6 4.7*   < > = values in this interval not displayed.    GFR: Estimated Creatinine Clearance: 13.5 mL/min (A) (by C-G formula based on SCr of 4.05 mg/dL (H)). Liver Function Tests: Recent Labs  Lab 11/22/20 0019 11/23/20 0357 11/24/20 0052 11/25/20 0058 11/26/20 0107 11/27/20 0338 11/28/20 0024  AST 22  --   --   --   --   --   --   ALT 21  --   --   --   --   --   --   ALKPHOS 54  --   --   --   --   --   --   BILITOT 1.4*  --   --   --   --   --   --   PROT 6.6  --   --   --   --   --   --   ALBUMIN 3.6   < > 3.1* 2.8* 2.6* 2.7* 2.7*   < > = values in this interval not displayed.    No results for input(s): LIPASE, AMYLASE in the last 168 hours. No results for input(s): AMMONIA in the last 168 hours. Coagulation Profile: No results for input(s): INR, PROTIME in the last 168 hours. Cardiac Enzymes: No results for input(s): CKTOTAL, CKMB, CKMBINDEX, TROPONINI in the last 168 hours.  BNP (last 3 results) No results for input(s): PROBNP in the last 8760 hours. HbA1C: No results for input(s): HGBA1C in the last 72 hours. CBG: No results for input(s): GLUCAP in the last 168 hours. Lipid Profile: No results for input(s): CHOL, HDL, LDLCALC, TRIG, CHOLHDL, LDLDIRECT in the last 72 hours. Thyroid Function Tests: No results for input(s): TSH, T4TOTAL, FREET4, T3FREE, THYROIDAB in the last 72 hours. Anemia Panel: No results for input(s): VITAMINB12, FOLATE, FERRITIN, TIBC, IRON, RETICCTPCT in the last 72 hours.  Sepsis Labs: No results for input(s): PROCALCITON,  LATICACIDVEN in the last 168 hours.   Recent  Results (from the past 240 hour(s))  Resp Panel by RT-PCR (Flu A&B, Covid) Nasopharyngeal Swab     Status: None   Collection Time: 11/19/20  5:19 PM   Specimen: Nasopharyngeal Swab; Nasopharyngeal(NP) swabs in vial transport medium  Result Value Ref Range Status   SARS Coronavirus 2 by RT PCR NEGATIVE NEGATIVE Final    Comment: (NOTE) SARS-CoV-2 target nucleic acids are NOT DETECTED.  The SARS-CoV-2 RNA is generally detectable in upper respiratory specimens during the acute phase of infection. The lowest concentration of SARS-CoV-2 viral copies this assay can detect is 138 copies/mL. A negative result does not preclude SARS-Cov-2 infection and should not be used as the sole basis for treatment or other patient management decisions. A negative result may occur with  improper specimen collection/handling, submission of specimen other than nasopharyngeal swab, presence of viral mutation(s) within the areas targeted by this assay, and inadequate number of viral copies(<138 copies/mL). A negative result must be combined with clinical observations, patient history, and epidemiological information. The expected result is Negative.  Fact Sheet for Patients:  EntrepreneurPulse.com.au  Fact Sheet for Healthcare Providers:  IncredibleEmployment.be  This test is no t yet approved or cleared by the [place] FDA and  has been authorized for detection and/or diagnosis of SARS-CoV-2 by FDA under an Emergency Use Authorization (EUA). This EUA will remain  in effect (meaning this test can be used) for the duration of the COVID-19 declaration under Section 564(b)(1) of the Act, 21 U.S.C.section 360bbb-3(b)(1), unless the authorization is terminated  or revoked sooner.       Influenza A by PCR NEGATIVE NEGATIVE Final   Influenza B by PCR NEGATIVE NEGATIVE Final    Comment: (NOTE) The Xpert Xpress  SARS-CoV-2/FLU/RSV plus assay is intended as an aid in the diagnosis of influenza from Nasopharyngeal swab specimens and should not be used as a sole basis for treatment. Nasal washings and aspirates are unacceptable for Xpert Xpress SARS-CoV-2/FLU/RSV testing.  Fact Sheet for Patients: EntrepreneurPulse.com.au  Fact Sheet for Healthcare Providers: IncredibleEmployment.be  This test is not yet approved or cleared by the [place] FDA and has been authorized for detection and/or diagnosis of SARS-CoV-2 by FDA under an Emergency Use Authorization (EUA). This EUA will remain in effect (meaning this test can be used) for the duration of the COVID-19 declaration under Section 564(b)(1) of the Act, 21 U.S.C. section 360bbb-3(b)(1), unless the authorization is terminated or revoked.  Performed at [facility], [place] [address] [place], [place] [address]   Gastrointestinal Panel by PCR , Stool     Status: None   Collection Time: 11/20/20  6:48 PM   Specimen: Stool  Result Value Ref Range Status   Campylobacter species NOT DETECTED NOT DETECTED Final   Plesimonas shigelloides NOT DETECTED NOT DETECTED Final   Salmonella species NOT DETECTED NOT DETECTED Final   Yersinia enterocolitica NOT DETECTED NOT DETECTED Final   Vibrio species NOT DETECTED NOT DETECTED Final   Vibrio cholerae NOT DETECTED NOT DETECTED Final   Enteroaggregative E coli (EAEC) NOT DETECTED NOT DETECTED Final   Enteropathogenic E coli (EPEC) NOT DETECTED NOT DETECTED Final   Enterotoxigenic E coli (ETEC) NOT DETECTED NOT DETECTED Final   Shiga like toxin producing E coli (STEC) NOT DETECTED NOT DETECTED Final   Shigella/Enteroinvasive E coli (EIEC) NOT DETECTED NOT DETECTED Final   Cryptosporidium NOT DETECTED NOT DETECTED Final   Cyclospora cayetanensis NOT DETECTED NOT DETECTED Final   Entamoeba histolytica NOT DETECTED NOT DETECTED  Final   Giardia lamblia NOT DETECTED  NOT DETECTED Final   Adenovirus F40/41 NOT DETECTED NOT DETECTED Final   Astrovirus NOT DETECTED NOT DETECTED Final   Norovirus GI/GII NOT DETECTED NOT DETECTED Final   Rotavirus A NOT DETECTED NOT DETECTED Final   Sapovirus (I, II, IV, and V) NOT DETECTED NOT DETECTED Final    Comment: Performed at [facility], [address] [place], [place] [address]  C Difficile Quick Screen w PCR reflex     Status: None   Collection Time: 11/20/20  6:48 PM   Specimen: STOOL  Result Value Ref Range Status   C Diff antigen NEGATIVE NEGATIVE Final   C Diff toxin NEGATIVE NEGATIVE Final   C Diff interpretation No C. difficile detected.  Final    Comment: Performed at [facility], [place] [address] [place], [place] [address]    RN Pressure Injury Documentation: Pressure Injury 11/20/20 Upper;Lower;Left Stage 3 -  Full thickness tissue loss. Subcutaneous fat may be visible but bone, tendon or muscle are NOT exposed. (Active)  11/20/20   Location:   Location Orientation: Upper;Lower;Left  Staging: Stage 3 -  Full thickness tissue loss. Subcutaneous fat may be visible but bone, tendon or muscle are NOT exposed.  Wound Description (Comments):   Present on Admission: Yes     Pressure Injury 11/20/20 Right;Lower;Upper Unstageable - Full thickness tissue loss in which the base of the injury is covered by slough (yellow, tan, gray, green or brown) and/or eschar (tan, brown or black) in the wound bed. (Active)  11/20/20   Location:   Location Orientation: Right;Lower;Upper  Staging: Unstageable - Full thickness tissue loss in which the base of the injury is covered by slough (yellow, tan, gray, green or brown) and/or eschar (tan, brown or black) in the wound bed.  Wound Description (Comments):   Present on Admission: Yes     Pressure Injury 11/21/20 Thigh Left;Posterior;Proximal Unstageable - Full thickness tissue loss in which the base of the injury is covered by slough (yellow, tan, gray,  green or brown) and/or eschar (tan, brown or black) in the wound bed. (Active)  11/21/20 0800  Location: Thigh  Location Orientation: Left;Posterior;Proximal  Staging: Unstageable - Full thickness tissue loss in which the base of the injury is covered by slough (yellow, tan, gray, green or brown) and/or eschar (tan, brown or black) in the wound bed.  Wound Description (Comments):   Present on Admission: Yes     Pressure Injury 11/21/20 Buttocks Right Stage 2 -  Partial thickness loss of dermis presenting as a shallow open injury with a red, pink wound bed without slough. (Active)  11/21/20 0800  Location: Buttocks  Location Orientation: Right  Staging: Stage 2 -  Partial thickness loss of dermis presenting as a shallow open injury with a red, pink wound bed without slough.  Wound Description (Comments):   Present on Admission: Yes     Pressure Injury 11/21/20 Thigh Posterior;Proximal;Right Unstageable - Full thickness tissue loss in which the base of the injury is covered by slough (yellow, tan, gray, green or brown) and/or eschar (tan, brown or black) in the wound bed. (Active)  11/21/20 0800  Location: Thigh  Location Orientation: Posterior;Proximal;Right  Staging: Unstageable - Full thickness tissue loss in which the base of the injury is covered by slough (yellow, tan, gray, green or brown) and/or eschar (tan, brown or black) in the wound bed.  Wound Description (Comments):   Present on Admission: Yes  Estimated body mass index is 31.74 kg/m as calculated from the following:   Height as of this encounter: 5\' 6"  (1.676 m).   Weight as of this encounter: 89.2 kg.  Malnutrition Type:   Malnutrition Characteristics:   Nutrition Interventions:    Radiology Studies: No results found.  Scheduled Meds:  amLODipine  5 mg Oral QHS   aspirin EC  81 mg Oral Daily   calcitRIOL  0.25 mcg Oral Once per day on Mon Fri   capsicum   Topical BID   carvedilol  25 mg Oral BID WC    collagenase   Topical Daily   darbepoetin (ARANESP) injection - NON-DIALYSIS  100 mcg Subcutaneous Q Tue-1800   DULoxetine  30 mg Oral Daily   gabapentin  100 mg Oral QHS   heparin injection (subcutaneous)  5,000 Units Subcutaneous Q12H   multivitamin with minerals  1 tablet Oral Daily   pantoprazole  40 mg Oral Q0600   Continuous Infusions:    LOS: 9 days   Malahki Gasaway, DO [facility] PAGER is on AMION  If 7PM-7AM, please contact night-coverage [email or web address]

## 2020-11-29 LAB — RENAL FUNCTION PANEL
Albumin: 2.7 g/dL — ABNORMAL LOW (ref 3.5–5.0)
Anion gap: 9 (ref 5–15)
BUN: 134 mg/dL — ABNORMAL HIGH (ref 8–23)
CO2: 21 mmol/L — ABNORMAL LOW (ref 22–32)
Calcium: 9 mg/dL (ref 8.9–10.3)
Chloride: 118 mmol/L — ABNORMAL HIGH (ref 98–111)
Creatinine, Ser: 4 mg/dL — ABNORMAL HIGH (ref 0.44–1.00)
GFR, Estimated: 11 mL/min — ABNORMAL LOW (ref >60)
Glucose, Bld: 105 mg/dL — ABNORMAL HIGH (ref 70–99)
Phosphorus: 4.3 mg/dL (ref 2.5–4.6)
Potassium: 4 mmol/L (ref 3.5–5.1)
Sodium: 148 mmol/L — ABNORMAL HIGH (ref 135–145)

## 2020-11-29 MED ORDER — NEPRO/CARBSTEADY PO LIQD
237.0000 mL | Freq: Three times a day (TID) | ORAL | Status: DC
  Administered 2020-11-29 – 2020-11-30 (×3): 237 mL via ORAL

## 2020-11-29 MED ORDER — DEXTROSE 5 % IV SOLN: INTRAVENOUS | Status: DC

## 2020-11-29 NOTE — Progress Notes (Signed)
PROGRESS NOTE    [name]  KXF:[id number] DOB: 08-18-1945 DOA: 11/19/2020 PCP: Hoyt Koch, MD   Brief Narrative:  76 y.o. female with medical history significant of pulmonary HTN; HTN; stage 4 CKD; s/p bioprosthetic AVR with AAA repair (05/2017)HFpEF,Chronic hypoxic respiratory failure on 2 L-3 L at all times, Severe OA, chronic lower extremity weakness-barely ambulatory at home-presented with worsening weakness/diarrhea-she was found to have AKI and subsequently admitted to the hospitalist service.  See below for further details.  Subjective:  No major issues overnight-lying comfortably in bed.  No nausea or vomiting.  Left neck spasms are better.  Assessment & Plan: AKI on CKD stage IV: AKI hemodynamically mediated-slowly improving.  Continue to avoid nephrotoxic agents.    Hypernatremia: Mild but trending up-discussed with nephrology-start D5W-encourage oral intake.  Normocytic anemia: Hemoglobin stable-no overt GI bleeding apparent even though she is FOBT positive.  Continue to follow CBC.  Suspect anemia is probably due to acute illness superimposed on anemia of CKD-required 2 units of PRBC so far.  Aranesp/IV iron defer to nephrology.  Diarrhea: Resolved with supportive care.  C. difficile studies and GI pathogen panel was negative.  Severe OA with chronic bilateral lower extremity weakness/ambulatory dysfunction/chronic back pain: Per patient-she was very minimally ambulatory at baseline (per H&P mostly wheelchair-bound-but just about able to transfer from bed to wheelchair)-suspect that lower extremity weakness is mostly chronic-and due to OA/degenerative spine disease/spinal stenosis.  Per patient-due to her significant comorbid medical conditions-her outpatient cardiologist has recommended against any surgical procedures.  She apparently follows with outpatient orthopedics for intra-articular steroid injections.  On exam today-she is easily able to move her legs  side-to-side-and just about able to raise them off the bed (against gravity)-palpation-this is close to her usual baseline.  Continue supportive care-continue PT/OT-with overall improvement in her renal function and improvement in her debility/weakness due to renal failure-hopefully her lower extremity weakness will improve somewhat.  Plans are for SNF on discharge  HTN: BP stable-continue amlodipine and Coreg  HFpEF: Volume status stable-avoid diuretics given AKI.  History of severe AS-s/p aortic valve replacement with bioprosthetic valve   History of repair of ascending aortic aneurysm  Chronic hypoxic respiratory failure: On 2-3 L of oxygen at home  Obesity: Body mass index is 31.74 kg/m.   Pressure ulcer: Pressure Injury 11/20/20 Upper;Lower;Left Stage 3 -  Full thickness tissue loss. Subcutaneous fat may be visible but bone, tendon or muscle are NOT exposed. (Active)  11/20/20   Location:   Location Orientation: Upper;Lower;Left  Staging: Stage 3 -  Full thickness tissue loss. Subcutaneous fat may be visible but bone, tendon or muscle are NOT exposed.  Wound Description (Comments):   Present on Admission: Yes     Pressure Injury 11/20/20 Right;Lower;Upper Unstageable - Full thickness tissue loss in which the base of the injury is covered by slough (yellow, tan, gray, green or brown) and/or eschar (tan, brown or black) in the wound bed. (Active)  11/20/20   Location:   Location Orientation: Right;Lower;Upper  Staging: Unstageable - Full thickness tissue loss in which the base of the injury is covered by slough (yellow, tan, gray, green or brown) and/or eschar (tan, brown or black) in the wound bed.  Wound Description (Comments):   Present on Admission: Yes     Pressure Injury 11/21/20 Thigh Left;Posterior;Proximal Unstageable - Full thickness tissue loss in which the base of the injury is covered by slough (yellow, tan, gray, green or brown) and/or eschar (tan, brown or black)  in  the wound bed. (Active)  11/21/20 0800  Location: Thigh  Location Orientation: Left;Posterior;Proximal  Staging: Unstageable - Full thickness tissue loss in which the base of the injury is covered by slough (yellow, tan, gray, green or brown) and/or eschar (tan, brown or black) in the wound bed.  Wound Description (Comments):   Present on Admission: Yes     Pressure Injury 11/21/20 Buttocks Right Stage 2 -  Partial thickness loss of dermis presenting as a shallow open injury with a red, pink wound bed without slough. (Active)  11/21/20 0800  Location: Buttocks  Location Orientation: Right  Staging: Stage 2 -  Partial thickness loss of dermis presenting as a shallow open injury with a red, pink wound bed without slough.  Wound Description (Comments):   Present on Admission: Yes     Pressure Injury 11/21/20 Thigh Posterior;Proximal;Right Unstageable - Full thickness tissue loss in which the base of the injury is covered by slough (yellow, tan, gray, green or brown) and/or eschar (tan, brown or black) in the wound bed. (Active)  11/21/20 0800  Location: Thigh  Location Orientation: Posterior;Proximal;Right  Staging: Unstageable - Full thickness tissue loss in which the base of the injury is covered by slough (yellow, tan, gray, green or brown) and/or eschar (tan, brown or black) in the wound bed.  Wound Description (Comments):   Present on Admission: Yes   DVT prophylaxis: SCDs Code Status: FULL CODE  Family Communication: Spouse-[phone] to leave a voicemail on 8/19 Disposition Plan: SNF  Status is: Inpatient  Remains inpatient appropriate because:Unsafe d/c plan, IV treatments appropriate due to intensity of illness or inability to take PO, and Inpatient level of care appropriate due to severity of illness  Dispo: The patient is from: Home              Anticipated d/c is to:  TBD              Patient currently is not medically stable to d/c.   Difficult to place  patient No  Consultants:  Nephrology   Procedures: None  Antimicrobials:  Anti-infectives (From admission, onward)    Start     Dose/Rate Route Frequency Ordered Stop   11/19/20 2057  cefTRIAXone (ROCEPHIN) 1 g in sodium chloride 0.9 % 100 mL IVPB        1 g 200 mL/hr over 30 Minutes Intravenous  Once 11/19/20 1847 11/19/20 2200        Objective: Vitals:   11/29/20 0011 11/29/20 0400 11/29/20 0800 11/29/20 1215  BP:  120/61 (!) 109/54 (!) 108/56  Pulse:  82 84 79  Resp:  18 18 18   Temp: 98.6 F (37 C) 98.5 F (36.9 C) 98.3 F (36.8 C) 98.3 F (36.8 C)  TempSrc: Oral Oral Oral Oral  SpO2:  97% 98% 98%  Weight:      Height:        Intake/Output Summary (Last 24 hours) at 11/29/2020 1417 Last data filed at 11/29/2020 0258 Gross per 24 hour  Intake 630 ml  Output 1050 ml  Net -420 ml    Filed Weights   11/19/20 1217 11/20/20 2327  Weight: 77.1 kg 89.2 kg   Examination: Gen Exam:Alert awake-not in any distress HEENT:atraumatic, normocephalic Chest: B/L clear to auscultation anteriorly CVS:S1S2 regular Abdomen:soft non tender, non distended Extremities:no edema Neurology: Non focal-has chronic lower extremity weakness-just about able to lift leg off the bed.  Per patient this is her baseline. Skin: no rash   Data  Reviewed: I have personally reviewed following labs and imaging studies  CBC: Recent Labs  Lab 11/23/20 0357 11/24/20 0052 11/26/20 0107 11/27/20 0338  WBC 14.0* 16.1* 12.1* 12.2*  HGB 8.1* 8.4* 7.5* 7.7*  HCT 25.7* 27.0* 24.3* 25.5*  MCV 92.8 94.1 96.0 97.7  PLT 337 359 296 546    Basic Metabolic Panel: Recent Labs  Lab 11/25/20 0058 11/26/20 0107 11/27/20 0338 11/28/20 0024 11/29/20 0131  NA 140 146* 146* 146* 148*  K 3.7 3.7 3.9 4.0 4.0  CL 110 115* 116* 116* 118*  CO2 19* 20* 20* 21* 21*  GLUCOSE 86 98 97 121* 105*  BUN 165* 157* 148* 142* 134*  CREATININE 4.55* 4.32* 4.19* 4.05* 4.00*  CALCIUM 9.0 8.8* 8.8* 8.9 9.0  PHOS  5.1* 4.6 4.6 4.7* 4.3    GFR: Estimated Creatinine Clearance: 13.7 mL/min (A) (by C-G formula based on SCr of 4 mg/dL (H)). Liver Function Tests: Recent Labs  Lab 11/25/20 0058 11/26/20 0107 11/27/20 0338 11/28/20 0024 11/29/20 0131  ALBUMIN 2.8* 2.6* 2.7* 2.7* 2.7*    No results for input(s): LIPASE, AMYLASE in the last 168 hours. No results for input(s): AMMONIA in the last 168 hours. Coagulation Profile: No results for input(s): INR, PROTIME in the last 168 hours. Cardiac Enzymes: No results for input(s): CKTOTAL, CKMB, CKMBINDEX, TROPONINI in the last 168 hours.  BNP (last 3 results) No results for input(s): PROBNP in the last 8760 hours. HbA1C: No results for input(s): HGBA1C in the last 72 hours. CBG: No results for input(s): GLUCAP in the last 168 hours. Lipid Profile: No results for input(s): CHOL, HDL, LDLCALC, TRIG, CHOLHDL, LDLDIRECT in the last 72 hours. Thyroid Function Tests: No results for input(s): TSH, T4TOTAL, FREET4, T3FREE, THYROIDAB in the last 72 hours. Anemia Panel: No results for input(s): VITAMINB12, FOLATE, FERRITIN, TIBC, IRON, RETICCTPCT in the last 72 hours.  Sepsis Labs: No results for input(s): PROCALCITON, LATICACIDVEN in the last 168 hours.   Recent Results (from the past 240 hour(s))  Resp Panel by RT-PCR (Flu A&B, Covid) Nasopharyngeal Swab     Status: None   Collection Time: 11/19/20  5:19 PM   Specimen: Nasopharyngeal Swab; Nasopharyngeal(NP) swabs in vial transport medium  Result Value Ref Range Status   SARS Coronavirus 2 by RT PCR NEGATIVE NEGATIVE Final    Comment: (NOTE) SARS-CoV-2 target nucleic acids are NOT DETECTED.  The SARS-CoV-2 RNA is generally detectable in upper respiratory specimens during the acute phase of infection. The lowest concentration of SARS-CoV-2 viral copies this assay can detect is 138 copies/mL. A negative result does not preclude SARS-Cov-2 infection and should not be used as the sole basis for  treatment or other patient management decisions. A negative result may occur with  improper specimen collection/handling, submission of specimen other than nasopharyngeal swab, presence of viral mutation(s) within the areas targeted by this assay, and inadequate number of viral copies(<138 copies/mL). A negative result must be combined with clinical observations, patient history, and epidemiological information. The expected result is Negative.  Fact Sheet for Patients:  EntrepreneurPulse.com.au  Fact Sheet for Healthcare Providers:  IncredibleEmployment.be  This test is no t yet approved or cleared by the [place] FDA and  has been authorized for detection and/or diagnosis of SARS-CoV-2 by FDA under an Emergency Use Authorization (EUA). This EUA will remain  in effect (meaning this test can be used) for the duration of the COVID-19 declaration under Section 564(b)(1) of the Act, 21 U.S.C.section 360bbb-3(b)(1), unless the authorization  is terminated  or revoked sooner.       Influenza A by PCR NEGATIVE NEGATIVE Final   Influenza B by PCR NEGATIVE NEGATIVE Final    Comment: (NOTE) The Xpert Xpress SARS-CoV-2/FLU/RSV plus assay is intended as an aid in the diagnosis of influenza from Nasopharyngeal swab specimens and should not be used as a sole basis for treatment. Nasal washings and aspirates are unacceptable for Xpert Xpress SARS-CoV-2/FLU/RSV testing.  Fact Sheet for Patients: EntrepreneurPulse.com.au  Fact Sheet for Healthcare Providers: IncredibleEmployment.be  This test is not yet approved or cleared by the [place] FDA and has been authorized for detection and/or diagnosis of SARS-CoV-2 by FDA under an Emergency Use Authorization (EUA). This EUA will remain in effect (meaning this test can be used) for the duration of the COVID-19 declaration under Section 564(b)(1) of the Act, 21  U.S.C. section 360bbb-3(b)(1), unless the authorization is terminated or revoked.  Performed at [facility], [place] [address] [place], [place] [address]   Gastrointestinal Panel by PCR , Stool     Status: None   Collection Time: 11/20/20  6:48 PM   Specimen: Stool  Result Value Ref Range Status   Campylobacter species NOT DETECTED NOT DETECTED Final   Plesimonas shigelloides NOT DETECTED NOT DETECTED Final   Salmonella species NOT DETECTED NOT DETECTED Final   Yersinia enterocolitica NOT DETECTED NOT DETECTED Final   Vibrio species NOT DETECTED NOT DETECTED Final   Vibrio cholerae NOT DETECTED NOT DETECTED Final   Enteroaggregative E coli (EAEC) NOT DETECTED NOT DETECTED Final   Enteropathogenic E coli (EPEC) NOT DETECTED NOT DETECTED Final   Enterotoxigenic E coli (ETEC) NOT DETECTED NOT DETECTED Final   Shiga like toxin producing E coli (STEC) NOT DETECTED NOT DETECTED Final   Shigella/Enteroinvasive E coli (EIEC) NOT DETECTED NOT DETECTED Final   Cryptosporidium NOT DETECTED NOT DETECTED Final   Cyclospora cayetanensis NOT DETECTED NOT DETECTED Final   Entamoeba histolytica NOT DETECTED NOT DETECTED Final   Giardia lamblia NOT DETECTED NOT DETECTED Final   Adenovirus F40/41 NOT DETECTED NOT DETECTED Final   Astrovirus NOT DETECTED NOT DETECTED Final   Norovirus GI/GII NOT DETECTED NOT DETECTED Final   Rotavirus A NOT DETECTED NOT DETECTED Final   Sapovirus (I, II, IV, and V) NOT DETECTED NOT DETECTED Final    Comment: Performed at [facility], [address] [address] [place], [place] [address]  C Difficile Quick Screen w PCR reflex     Status: None   Collection Time: 11/20/20  6:48 PM   Specimen: STOOL  Result Value Ref Range Status   C Diff antigen NEGATIVE NEGATIVE Final   C Diff toxin NEGATIVE NEGATIVE Final   C Diff interpretation No C. difficile detected.  Final    Comment: Performed at [facility], [place] [address] [place], [place] [address]    RN  Pressure Injury Documentation: Pressure Injury 11/20/20 Upper;Lower;Left Stage 3 -  Full thickness tissue loss. Subcutaneous fat may be visible but bone, tendon or muscle are NOT exposed. (Active)  11/20/20   Location:   Location Orientation: Upper;Lower;Left  Staging: Stage 3 -  Full thickness tissue loss. Subcutaneous fat may be visible but bone, tendon or muscle are NOT exposed.  Wound Description (Comments):   Present on Admission: Yes     Pressure Injury 11/20/20 Right;Lower;Upper Unstageable - Full thickness tissue loss in which the base of the injury is covered by slough (yellow, tan, gray, green or brown) and/or eschar (  tan, brown or black) in the wound bed. (Active)  11/20/20   Location:   Location Orientation: Right;Lower;Upper  Staging: Unstageable - Full thickness tissue loss in which the base of the injury is covered by slough (yellow, tan, gray, green or brown) and/or eschar (tan, brown or black) in the wound bed.  Wound Description (Comments):   Present on Admission: Yes     Pressure Injury 11/21/20 Thigh Left;Posterior;Proximal Unstageable - Full thickness tissue loss in which the base of the injury is covered by slough (yellow, tan, gray, green or brown) and/or eschar (tan, brown or black) in the wound bed. (Active)  11/21/20 0800  Location: Thigh  Location Orientation: Left;Posterior;Proximal  Staging: Unstageable - Full thickness tissue loss in which the base of the injury is covered by slough (yellow, tan, gray, green or brown) and/or eschar (tan, brown or black) in the wound bed.  Wound Description (Comments):   Present on Admission: Yes     Pressure Injury 11/21/20 Buttocks Right Stage 2 -  Partial thickness loss of dermis presenting as a shallow open injury with a red, pink wound bed without slough. (Active)  11/21/20 0800  Location: Buttocks  Location Orientation: Right  Staging: Stage 2 -  Partial thickness loss of dermis presenting as a shallow open injury with  a red, pink wound bed without slough.  Wound Description (Comments):   Present on Admission: Yes     Pressure Injury 11/21/20 Thigh Posterior;Proximal;Right Unstageable - Full thickness tissue loss in which the base of the injury is covered by slough (yellow, tan, gray, green or brown) and/or eschar (tan, brown or black) in the wound bed. (Active)  11/21/20 0800  Location: Thigh  Location Orientation: Posterior;Proximal;Right  Staging: Unstageable - Full thickness tissue loss in which the base of the injury is covered by slough (yellow, tan, gray, green or brown) and/or eschar (tan, brown or black) in the wound bed.  Wound Description (Comments):   Present on Admission: Yes     Estimated body mass index is 31.74 kg/m as calculated from the following:   Height as of this encounter: 5\' 6"  (1.676 m).   Weight as of this encounter: 89.2 kg.  Malnutrition Type: Nutrition Problem: Inadequate oral intake Etiology: decreased appetite Malnutrition Characteristics: Signs/Symptoms: meal completion < 25% Nutrition Interventions: Interventions: Nepro shake, MVI  Radiology Studies: No results found.  Scheduled Meds:  amLODipine  5 mg Oral QHS   aspirin EC  81 mg Oral Daily   calcitRIOL  0.25 mcg Oral Once per day on Mon Fri   capsicum   Topical BID   carvedilol  25 mg Oral BID WC   collagenase   Topical Daily   darbepoetin (ARANESP) injection - NON-DIALYSIS  100 mcg Subcutaneous Q Tue-1800   DULoxetine  30 mg Oral Daily   feeding supplement (NEPRO CARB STEADY)  237 mL Oral TID BM   gabapentin  100 mg Oral QHS   heparin injection (subcutaneous)  5,000 Units Subcutaneous Q12H   multivitamin with minerals  1 tablet Oral Daily   pantoprazole  40 mg Oral Q0600   Continuous Infusions:  dextrose       LOS: 10 days   Ileane Sando, DO [facility] PAGER is on AMION  If 7PM-7AM, please contact night-coverage [email or web address]

## 2020-11-29 NOTE — Progress Notes (Signed)
[facility] NEPHROLOGY PROGRESS NOTE  Assessment/ Plan:  #  AKI on CKD4, nonoliguric: Baseline creatinine level seems to be around 2.4-2.6 and was followed by Dr.Upton.  AKI presumably hemodynamically mediated in the setting of diarrhea and may have some progressive underlying CKD.  Slow but gradual improvement of creatinine and BUN.  NO sig uremic Sx at this time.  She does not want RRT if indicated. Seen by palliative care team as well.  Focus has been on oral hydration.  Might need some D5W because of a trend towards hypernatremia.   # Hypokalemia: resolved  # Chronic hypoxic respiratory failure: She appears to be at baseline with regards to her respiratory status on oxygen supplementation at 2 L/min via nasal cannula.  # Anemia of chronic kidney disease/chronic illness: With iron deficiency and associated anemia, started on iron supplementation/ESA.  Seen by gastroenterology with concerns of positive FOBT (entirely possible that elevated BUN may in part be from GI bleed). Per TR[facility]/GI  #  Chronic cervical spine radiculopathy with chronic back pain with acute on chronic ambulation dysfunction: Suspect that with her limitations and current social support, she will unlikely be able to return home and will need placement at a skilled nursing facility.  Discussed with the primary team.   Subjective:  No interval events, vital signs stable, reported 1 L urine output BUN continues to slowly improve, creatinine 4.0 Patient confirms with me again today that she would not wish to receive dialysis Serum sodium 148, patient taking little in by mouth  Objective Vital signs in last 24 hours: Vitals:   11/29/20 0011 11/29/20 0400 11/29/20 0800 11/29/20 1215  BP:  120/61 (!) 109/54 (!) 108/56  Pulse:  82 84 79  Resp:  18 18 18   Temp: 98.6 F (37 C) 98.5 F (36.9 C) 98.3 F (36.8 C) 98.3 F (36.8 C)  TempSrc: Oral Oral Oral Oral  SpO2:  97% 98% 98%  Weight:      Height:        Weight change:   Intake/Output Summary (Last 24 hours) at 11/29/2020 1253 Last data filed at 11/29/2020 0949 Gross per 24 hour  Intake 630 ml  Output 1050 ml  Net -420 ml        Labs: Basic Metabolic Panel: Recent Labs  Lab 11/27/20 0338 11/28/20 0024 11/29/20 0131  NA 146* 146* 148*  K 3.9 4.0 4.0  CL 116* 116* 118*  CO2 20* 21* 21*  GLUCOSE 97 121* 105*  BUN 148* 142* 134*  CREATININE 4.19* 4.05* 4.00*  CALCIUM 8.8* 8.9 9.0  PHOS 4.6 4.7* 4.3    Liver Function Tests: Recent Labs  Lab 11/27/20 0338 11/28/20 0024 11/29/20 0131  ALBUMIN 2.7* 2.7* 2.7*    No results for input(s): LIPASE, AMYLASE in the last 168 hours. No results for input(s): AMMONIA in the last 168 hours. CBC: Recent Labs  Lab 11/23/20 0357 11/24/20 0052 11/26/20 0107 11/27/20 0338  WBC 14.0* 16.1* 12.1* 12.2*  HGB 8.1* 8.4* 7.5* 7.7*  HCT 25.7* 27.0* 24.3* 25.5*  MCV 92.8 94.1 96.0 97.7  PLT 337 359 296 288    Cardiac Enzymes: No results for input(s): CKTOTAL, CKMB, CKMBINDEX, TROPONINI in the last 168 hours.  CBG: No results for input(s): GLUCAP in the last 168 hours.  Iron Studies: No results for input(s): IRON, TIBC, TRANSFERRIN, FERRITIN in the last 72 hours. Studies/Results: No results found.  Medications: Infusions:    Scheduled Medications:  amLODipine  5 mg Oral QHS  aspirin EC  81 mg Oral Daily   calcitRIOL  0.25 mcg Oral Once per day on Mon Fri   capsicum   Topical BID   carvedilol  25 mg Oral BID WC   collagenase   Topical Daily   darbepoetin (ARANESP) injection - NON-DIALYSIS  100 mcg Subcutaneous Q Tue-1800   DULoxetine  30 mg Oral Daily   feeding supplement (NEPRO CARB STEADY)  237 mL Oral TID BM   gabapentin  100 mg Oral QHS   heparin injection (subcutaneous)  5,000 Units Subcutaneous Q12H   multivitamin with minerals  1 tablet Oral Daily   pantoprazole  40 mg Oral Q0600    have reviewed scheduled and prn medications.  Physical Exam: General:  Not in distress Heart:RRR, s1s2 nl Lungs: Distant breath sound anteriorly, clear, no wheeze or crackles Abdomen:soft, Non-tender, non-distended Extremities:No LE edema Neurology: Alert, awake and following commands, no asterixis  Rexene Agent 11/29/2020,12:53 PM  LOS: 10 days

## 2020-11-29 NOTE — Progress Notes (Signed)
Initial Nutrition Assessment  DOCUMENTATION CODES:   Not applicable  INTERVENTION:   -Nepro Shake po TID, each supplement provides 425 kcal and 19 grams protein  -MVI with minerals daily -Downgrade diet to dysphagia 3 (advanced mechanical soft) diet for ease of intake  NUTRITION DIAGNOSIS:   Inadequate oral intake related to decreased appetite as evidenced by meal completion < 25%.  GOAL:   Patient will meet greater than or equal to 90% of their needs  MONITOR:   PO intake, Supplement acceptance, Labs, Weight trends, Skin, I & O's  REASON FOR ASSESSMENT:   Low Braden    ASSESSMENT:   75 y.o. female with medical history significant of pulmonary HTN; HTN; stage 4 CKD; s/p bioprosthetic AVR with AAA repair (05/2017)HFpEF,Chronic hypoxic respiratory failure on 2 L-3 L at all times, Severe OA, chronic lower extremity weakness-barely ambulatory at home-presented with worsening weakness/diarrhea-she was found to have AKI and subsequently admitted to the hospitalist service.  See below for further details.  Pt admitted with AKI on CKF IV.    Reviewed I/O's: -420 ml x 24 hours and +235 ml since admission  UOP: 1.1 L x 24 hours  Spoke with pt at bedside, who reports feeling poorly today due to her neck hurting. She reports she was had a poor appetite over the past 2 weeks, which she attributes to pain and difficulty chewing harder textured foods, such as meats and vegetables. [name] also provides outside food to pt, which she has not been able to consume much of. PTA, pt reports consuming 3 meals per day, which is provided by [employer]. However, pt shares that she has been so weak that she became bedbound and suspects this is why she developed her pressure injuries (per [facility] notes, pt with stage 3 pressure injury to lt lower buttock and unstageable pressure injuries to rt upper buttock and rt lower buttock). Noted meal completion 0-30%.   Pt unsure if she has lost weight.    Discussed importance of good meal and supplement intake to promote healing. Pt amenable to Nepro and diet donwgrade.   Medications reviewed and include aranesp.  Labs reviewed: Na: 148.   NUTRITION - FOCUSED PHYSICAL EXAM:  Flowsheet Row Most Recent Value  Orbital Region No depletion  Upper Arm Region No depletion  Thoracic and Lumbar Region No depletion  Buccal Region No depletion  Temple Region No depletion  Clavicle Bone Region No depletion  Clavicle and Acromion Bone Region No depletion  Scapular Bone Region No depletion  Dorsal Hand No depletion  Patellar Region No depletion  Anterior Thigh Region No depletion  Posterior Calf Region No depletion  Edema (RD Assessment) Mild  Hair Reviewed  Eyes Reviewed  Mouth Reviewed  Skin Reviewed  Nails Reviewed       Diet Order:   Diet Order             DIET DYS 3 Room service appropriate? Yes with Assist; Fluid consistency: Thin; Fluid restriction: 1200 mL Fluid  Diet effective now                   EDUCATION NEEDS:   Education needs have been addressed  Skin:  Skin Assessment: Skin Integrity Issues: Skin Integrity Issues:: Stage III, Unstageable Stage III: lt lower buttock Unstageable: rt lower buttock, rt upper buttock  Last BM:  11/28/20  Height:   Ht Readings from Last 1 Encounters:  11/20/20 5\' 6"  (1.676 m)    Weight:  Wt Readings from Last 1 Encounters:  11/20/20 89.2 kg    Ideal Body Weight:  59.1 kg  BMI:  Body mass index is 31.74 kg/m.  Estimated Nutritional Needs:   Kcal:  1950-2150  Protein:  105-120 grams  Fluid:  1.2 L    Loistine Chance, RD, LDN, [place] Registered Dietitian II Certified Diabetes Care and Education Specialist Please refer to [facility] for RD and/or RD on-call/weekend/after hours pager

## 2020-11-30 DIAGNOSIS — I5033 Acute on chronic diastolic (congestive) heart failure: Secondary | ICD-10-CM | POA: Diagnosis not present

## 2020-11-30 DIAGNOSIS — L89300 Pressure ulcer of unspecified buttock, unstageable: Secondary | ICD-10-CM | POA: Diagnosis not present

## 2020-11-30 DIAGNOSIS — Z833 Family history of diabetes mellitus: Secondary | ICD-10-CM | POA: Diagnosis not present

## 2020-11-30 DIAGNOSIS — Z8371 Family history of colonic polyps: Secondary | ICD-10-CM | POA: Diagnosis not present

## 2020-11-30 DIAGNOSIS — I272 Pulmonary hypertension, unspecified: Secondary | ICD-10-CM | POA: Diagnosis not present

## 2020-11-30 DIAGNOSIS — Z8249 Family history of ischemic heart disease and other diseases of the circulatory system: Secondary | ICD-10-CM | POA: Diagnosis not present

## 2020-11-30 DIAGNOSIS — R1312 Dysphagia, oropharyngeal phase: Secondary | ICD-10-CM | POA: Diagnosis not present

## 2020-11-30 DIAGNOSIS — E870 Hyperosmolality and hypernatremia: Secondary | ICD-10-CM | POA: Diagnosis not present

## 2020-11-30 DIAGNOSIS — I422 Other hypertrophic cardiomyopathy: Secondary | ICD-10-CM | POA: Diagnosis not present

## 2020-11-30 DIAGNOSIS — N184 Chronic kidney disease, stage 4 (severe): Secondary | ICD-10-CM | POA: Diagnosis not present

## 2020-11-30 DIAGNOSIS — N189 Chronic kidney disease, unspecified: Secondary | ICD-10-CM | POA: Diagnosis not present

## 2020-11-30 DIAGNOSIS — E872 Acidosis: Secondary | ICD-10-CM | POA: Diagnosis not present

## 2020-11-30 DIAGNOSIS — Z743 Need for continuous supervision: Secondary | ICD-10-CM | POA: Diagnosis not present

## 2020-11-30 DIAGNOSIS — L89893 Pressure ulcer of other site, stage 3: Secondary | ICD-10-CM | POA: Diagnosis not present

## 2020-11-30 DIAGNOSIS — Z7189 Other specified counseling: Secondary | ICD-10-CM | POA: Diagnosis not present

## 2020-11-30 DIAGNOSIS — R197 Diarrhea, unspecified: Secondary | ICD-10-CM | POA: Diagnosis not present

## 2020-11-30 DIAGNOSIS — Z139 Encounter for screening, unspecified: Secondary | ICD-10-CM | POA: Diagnosis not present

## 2020-11-30 DIAGNOSIS — R6889 Other general symptoms and signs: Secondary | ICD-10-CM | POA: Diagnosis not present

## 2020-11-30 DIAGNOSIS — Z86010 Personal history of colonic polyps: Secondary | ICD-10-CM | POA: Diagnosis not present

## 2020-11-30 DIAGNOSIS — E669 Obesity, unspecified: Secondary | ICD-10-CM | POA: Diagnosis present

## 2020-11-30 DIAGNOSIS — G9341 Metabolic encephalopathy: Secondary | ICD-10-CM | POA: Diagnosis not present

## 2020-11-30 DIAGNOSIS — I5032 Chronic diastolic (congestive) heart failure: Secondary | ICD-10-CM | POA: Diagnosis not present

## 2020-11-30 DIAGNOSIS — J90 Pleural effusion, not elsewhere classified: Secondary | ICD-10-CM | POA: Diagnosis not present

## 2020-11-30 DIAGNOSIS — M79603 Pain in arm, unspecified: Secondary | ICD-10-CM | POA: Diagnosis not present

## 2020-11-30 DIAGNOSIS — M4802 Spinal stenosis, cervical region: Secondary | ICD-10-CM | POA: Diagnosis not present

## 2020-11-30 DIAGNOSIS — L89890 Pressure ulcer of other site, unstageable: Secondary | ICD-10-CM | POA: Diagnosis not present

## 2020-11-30 DIAGNOSIS — I132 Hypertensive heart and chronic kidney disease with heart failure and with stage 5 chronic kidney disease, or end stage renal disease: Secondary | ICD-10-CM | POA: Diagnosis not present

## 2020-11-30 DIAGNOSIS — Z515 Encounter for palliative care: Secondary | ICD-10-CM | POA: Diagnosis not present

## 2020-11-30 DIAGNOSIS — Z7401 Bed confinement status: Secondary | ICD-10-CM | POA: Diagnosis not present

## 2020-11-30 DIAGNOSIS — Z90711 Acquired absence of uterus with remaining cervical stump: Secondary | ICD-10-CM | POA: Diagnosis not present

## 2020-11-30 DIAGNOSIS — I129 Hypertensive chronic kidney disease with stage 1 through stage 4 chronic kidney disease, or unspecified chronic kidney disease: Secondary | ICD-10-CM | POA: Diagnosis not present

## 2020-11-30 DIAGNOSIS — L89313 Pressure ulcer of right buttock, stage 3: Secondary | ICD-10-CM | POA: Diagnosis not present

## 2020-11-30 DIAGNOSIS — E46 Unspecified protein-calorie malnutrition: Secondary | ICD-10-CM | POA: Diagnosis not present

## 2020-11-30 DIAGNOSIS — J9621 Acute and chronic respiratory failure with hypoxia: Secondary | ICD-10-CM | POA: Diagnosis not present

## 2020-11-30 DIAGNOSIS — R609 Edema, unspecified: Secondary | ICD-10-CM | POA: Diagnosis not present

## 2020-11-30 DIAGNOSIS — J9611 Chronic respiratory failure with hypoxia: Secondary | ICD-10-CM | POA: Diagnosis not present

## 2020-11-30 DIAGNOSIS — G629 Polyneuropathy, unspecified: Secondary | ICD-10-CM | POA: Diagnosis not present

## 2020-11-30 DIAGNOSIS — R404 Transient alteration of awareness: Secondary | ICD-10-CM | POA: Diagnosis not present

## 2020-11-30 DIAGNOSIS — N179 Acute kidney failure, unspecified: Secondary | ICD-10-CM | POA: Diagnosis not present

## 2020-11-30 DIAGNOSIS — L8940 Pressure ulcer of contiguous site of back, buttock and hip, unspecified stage: Secondary | ICD-10-CM

## 2020-11-30 DIAGNOSIS — M170 Bilateral primary osteoarthritis of knee: Secondary | ICD-10-CM | POA: Diagnosis not present

## 2020-11-30 DIAGNOSIS — Z6830 Body mass index (BMI) 30.0-30.9, adult: Secondary | ICD-10-CM | POA: Diagnosis not present

## 2020-11-30 DIAGNOSIS — L89310 Pressure ulcer of right buttock, unstageable: Secondary | ICD-10-CM | POA: Diagnosis not present

## 2020-11-30 DIAGNOSIS — M6281 Muscle weakness (generalized): Secondary | ICD-10-CM | POA: Diagnosis not present

## 2020-11-30 DIAGNOSIS — Z87440 Personal history of urinary (tract) infections: Secondary | ICD-10-CM | POA: Diagnosis not present

## 2020-11-30 DIAGNOSIS — M109 Gout, unspecified: Secondary | ICD-10-CM | POA: Diagnosis not present

## 2020-11-30 DIAGNOSIS — I10 Essential (primary) hypertension: Secondary | ICD-10-CM | POA: Diagnosis not present

## 2020-11-30 DIAGNOSIS — Z66 Do not resuscitate: Secondary | ICD-10-CM | POA: Diagnosis not present

## 2020-11-30 DIAGNOSIS — E876 Hypokalemia: Secondary | ICD-10-CM | POA: Diagnosis not present

## 2020-11-30 DIAGNOSIS — D649 Anemia, unspecified: Secondary | ICD-10-CM | POA: Diagnosis not present

## 2020-11-30 DIAGNOSIS — G894 Chronic pain syndrome: Secondary | ICD-10-CM | POA: Diagnosis not present

## 2020-11-30 DIAGNOSIS — D631 Anemia in chronic kidney disease: Secondary | ICD-10-CM | POA: Diagnosis not present

## 2020-11-30 DIAGNOSIS — Z953 Presence of xenogenic heart valve: Secondary | ICD-10-CM | POA: Diagnosis not present

## 2020-11-30 DIAGNOSIS — I872 Venous insufficiency (chronic) (peripheral): Secondary | ICD-10-CM | POA: Diagnosis not present

## 2020-11-30 DIAGNOSIS — R0602 Shortness of breath: Secondary | ICD-10-CM | POA: Diagnosis not present

## 2020-11-30 DIAGNOSIS — N186 End stage renal disease: Secondary | ICD-10-CM | POA: Diagnosis not present

## 2020-11-30 DIAGNOSIS — E875 Hyperkalemia: Secondary | ICD-10-CM | POA: Diagnosis not present

## 2020-11-30 DIAGNOSIS — E559 Vitamin D deficiency, unspecified: Secondary | ICD-10-CM | POA: Diagnosis not present

## 2020-11-30 LAB — RENAL FUNCTION PANEL
Albumin: 2.6 g/dL — ABNORMAL LOW (ref 3.5–5.0)
Anion gap: 8 (ref 5–15)
BUN: 129 mg/dL — ABNORMAL HIGH (ref 8–23)
CO2: 21 mmol/L — ABNORMAL LOW (ref 22–32)
Calcium: 9.1 mg/dL (ref 8.9–10.3)
Chloride: 117 mmol/L — ABNORMAL HIGH (ref 98–111)
Creatinine, Ser: 4.02 mg/dL — ABNORMAL HIGH (ref 0.44–1.00)
GFR, Estimated: 11 mL/min — ABNORMAL LOW (ref >60)
Glucose, Bld: 118 mg/dL — ABNORMAL HIGH (ref 70–99)
Phosphorus: 4.1 mg/dL (ref 2.5–4.6)
Potassium: 4.1 mmol/L (ref 3.5–5.1)
Sodium: 146 mmol/L — ABNORMAL HIGH (ref 135–145)

## 2020-11-30 LAB — RESP PANEL BY RT-PCR (FLU A&B, COVID) ARPGX2
Influenza A by PCR: NEGATIVE
Influenza B by PCR: NEGATIVE
SARS Coronavirus 2 by RT PCR: NEGATIVE

## 2020-11-30 MED ORDER — METHOCARBAMOL 500 MG PO TABS: 500.0000 mg | ORAL_TABLET | Freq: Three times a day (TID) | ORAL | Status: AC | PRN

## 2020-11-30 MED ORDER — NEPRO/CARBSTEADY PO LIQD: 237.0000 mL | Freq: Three times a day (TID) | ORAL | 0 refills | Status: AC

## 2020-11-30 MED ORDER — DULOXETINE HCL 30 MG PO CPEP: 30.0000 mg | ORAL_CAPSULE | Freq: Every day | ORAL | 3 refills | Status: AC

## 2020-11-30 MED ORDER — CAPSAICIN 0.075 % EX CREA: TOPICAL_CREAM | Freq: Two times a day (BID) | CUTANEOUS | 0 refills | Status: AC

## 2020-11-30 MED ORDER — PANTOPRAZOLE SODIUM 40 MG PO TBEC: 40.0000 mg | DELAYED_RELEASE_TABLET | Freq: Every day | ORAL | Status: AC

## 2020-11-30 NOTE — Progress Notes (Signed)
[facility] NEPHROLOGY PROGRESS NOTE  Assessment/ Plan:  #  AKI on CKD4, nonoliguric: Baseline creatinine level seems to be around 2.4-2.6 and was followed by Dr.Upton.  AKI presumably hemodynamically mediated in the setting of diarrhea and may have some progressive underlying CKD.  Slow but gradual improvement of creatinine and BUN.  NO sig uremic Sx at this time.  She does not want RRT if indicated. Seen by palliative care team as well.     # Hypokalemia: resolved  # Chronic hypoxic respiratory failure: She appears to be at baseline with regards to her respiratory status on oxygen supplementation at 2 L/min via nasal cannula.  # Anemia of chronic kidney disease/chronic illness: With iron deficiency and associated anemia, started on iron supplementation/ESA.  Seen by gastroenterology with concerns of positive FOBT (entirely possible that elevated BUN may in part be from GI bleed). Per TRH/GI  #  Chronic cervical spine radiculopathy with chronic back pain with acute on chronic ambulation dysfunction: Suspect that with her limitations and current social support, she will unlikely be able to return home and will need placement at a skilled nursing facility.  Likely to discharge today.  Already has follow-up arranged on 9/8 with Dr. Hollie Salk   Subjective:  No interval events Stable creatinine, serum sodium slightly improved Patient without complaint 1.2 L urine output yesterday  Objective Vital signs in last 24 hours: Vitals:   11/29/20 1944 11/30/20 0000 11/30/20 0400 11/30/20 0755  BP: 122/66 129/64 131/63 136/66  Pulse: 85 88 90 95  Resp: 18 18  20   Temp: 99.7 F (37.6 C) 98.8 F (37.1 C)  99.2 F (37.3 C)  TempSrc: Oral Oral  Oral  SpO2: 96% 97% 97% 96%  Weight:      Height:       Weight change:   Intake/Output Summary (Last 24 hours) at 11/30/2020 1147 Last data filed at 11/30/2020 0500 Gross per 24 hour  Intake 360 ml  Output 1150 ml  Net -790 ml         Labs: Basic Metabolic Panel: Recent Labs  Lab 11/28/20 0024 11/29/20 0131 11/30/20 0120  NA 146* 148* 146*  K 4.0 4.0 4.1  CL 116* 118* 117*  CO2 21* 21* 21*  GLUCOSE 121* 105* 118*  BUN 142* 134* 129*  CREATININE 4.05* 4.00* 4.02*  CALCIUM 8.9 9.0 9.1  PHOS 4.7* 4.3 4.1    Liver Function Tests: Recent Labs  Lab 11/28/20 0024 11/29/20 0131 11/30/20 0120  ALBUMIN 2.7* 2.7* 2.6*    No results for input(s): LIPASE, AMYLASE in the last 168 hours. No results for input(s): AMMONIA in the last 168 hours. CBC: Recent Labs  Lab 11/24/20 0052 11/26/20 0107 11/27/20 0338  WBC 16.1* 12.1* 12.2*  HGB 8.4* 7.5* 7.7*  HCT 27.0* 24.3* 25.5*  MCV 94.1 96.0 97.7  PLT 359 296 288    Cardiac Enzymes: No results for input(s): CKTOTAL, CKMB, CKMBINDEX, TROPONINI in the last 168 hours.  CBG: No results for input(s): GLUCAP in the last 168 hours.  Iron Studies: No results for input(s): IRON, TIBC, TRANSFERRIN, FERRITIN in the last 72 hours. Studies/Results: No results found.  Medications: Infusions:    Scheduled Medications:  amLODipine  5 mg Oral QHS   aspirin EC  81 mg Oral Daily   calcitRIOL  0.25 mcg Oral Once per day on Mon Fri   capsicum   Topical BID   carvedilol  25 mg Oral BID WC   collagenase  Topical Daily   darbepoetin (ARANESP) injection - NON-DIALYSIS  100 mcg Subcutaneous Q Tue-1800   DULoxetine  30 mg Oral Daily   feeding supplement (NEPRO CARB STEADY)  237 mL Oral TID BM   gabapentin  100 mg Oral QHS   heparin injection (subcutaneous)  5,000 Units Subcutaneous Q12H   multivitamin with minerals  1 tablet Oral Daily   pantoprazole  40 mg Oral Q0600    have reviewed scheduled and prn medications.  Physical Exam: General: Not in distress Heart:RRR, s1s2 nl Lungs: Distant breath sound anteriorly, clear, no wheeze or crackles Abdomen:soft, Non-tender, non-distended Extremities:No LE edema Neurology: Alert, awake and following commands,  no asterixis  [name] B [name] 11/30/2020,11:47 AM  LOS: 11 days

## 2020-11-30 NOTE — TOC Transition Note (Signed)
Transition of Care [facility]) - CM/SW Discharge Note   Patient Details  Name: [name] MRN: [id number] Date of Birth: [birth year]  [facility] CM/SW Contact:  Benard Halsted, LCSW Phone Number: 11/30/2020, 2:57 PM   Clinical Narrative:    Patient will DC to: [facility] Anticipated DC date: 11/30/20 Family notified: Spouse Transport by: Corey Harold   Per MD patient ready for DC to [facility]. RN to call report prior to discharge [phone]). RN, patient, patient's family, and facility notified of DC. Discharge Summary and FL2 sent to facility. DC packet on chart. Ambulance transport requested for patient.   CSW will sign off for now as social work intervention is no longer needed. Please consult [place] again if new needs arise.     Final next level of care: Skilled Nursing Facility Barriers to Discharge: Barriers Resolved   Patient Goals and CMS Choice Patient states their goals for this hospitalization and ongoing recovery are:: Rehab to get home CMS Medicare.gov Compare Post Acute Care list provided to:: Patient Choice offered to / list presented to : Patient  Discharge Placement   Existing PASRR number confirmed : 11/30/20          Patient [facility] bed at: [facility] Patient to be transferred to facility by: [facility] Name of family member notified: Spouse at bedside Patient and family notified of of transfer: 11/30/20  Discharge Plan and Services In-house Referral: Clinical Social Work Discharge Planning Services: CM Consult Post Acute Care Choice: [place]                               Social Determinants of Health (SDOH) Interventions     Readmission Risk Interventions Readmission Risk Prevention Plan 07/15/2018  Post Dischage Appt Complete  Medication Screening Complete  Transportation Screening Complete  Some recent data might be hidden

## 2020-11-30 NOTE — Discharge Summary (Addendum)
PATIENT DETAILS Name: [name] Age: 75 y.o. Sex: female Date of Birth: 03/25/1946 MRN: [id number]. Admitting Physician: Lequita Halt, MD OIZ:[id number], Real Cons, MD  Admit Date: 11/19/2020 Discharge date: 11/30/2020  Recommendations for Outpatient Follow-up:  Follow up with PCP in 1-2 weeks Please obtain CMP/CBC in one week Please ensure follow up with nephrology/[facility]. Please ensure outpatient follow up with GI for FOBT positive stools.  Admitted From:  Home  Disposition: SNF   Home Health: No  Equipment/Devices: None  Discharge Condition: Stable  CODE STATUS: FULL CODE  Diet recommendation:  Diet Order             Diet - low sodium heart healthy           DIET DYS 3 Room service appropriate? Yes with Assist; Fluid consistency: Thin; Fluid restriction: 1200 mL Fluid  Diet effective now                    Brief Narrative:  75 y.o. female with medical history significant of pulmonary HTN; HTN; stage 4 CKD; s/p bioprosthetic AVR with AAA repair (05/2017)HFpEF,Chronic hypoxic respiratory failure on 2 L-3 L at all times, Severe OA, chronic lower extremity weakness-barely ambulatory at home-presented with worsening weakness/diarrhea-she was found to have AKI and subsequently admitted to the hospitalist service.  See below for further details.    Brief Hospital Course: AKI on CKD stage IV: AKI hemodynamically mediated-creatinine has improved-seems to have plateaued around 4.0 range.  Continue to avoid nephrotoxic agents.  She was briefly managed with IV fluids.  Please ensure follow-up with nephrology in the outpatient setting.     Hypernatremia: Very mild-briefly required D5W infusion-continue to encourage oral intake.  Please repeat electrolytes in 1 week while at [facility].     Normocytic anemia: Hemoglobin stable-no overt GI bleeding apparent even though she is FOBT positive.  Continue to follow CBC.  Suspect anemia is probably due to acute illness  superimposed on anemia of CKD-required 2 units of PRBC so far.  Aranesp/IV iron defer to outpatient nephrology.   Diarrhea: Resolved with supportive care.  C. difficile studies and GI pathogen panel was negative.   Severe OA with chronic bilateral lower extremity weakness/ambulatory dysfunction/chronic back pain: Per patient-she was very minimally ambulatory at baseline (per H&P mostly wheelchair-bound-but just about able to transfer from bed to wheelchair)-suspect that lower extremity weakness is mostly chronic-and due to OA/degenerative spine disease/spinal stenosis.  Per patient-due to her significant comorbid medical conditions-her outpatient cardiologist has recommended against any surgical procedures.  She apparently follows with outpatient orthopedics for intra-articular steroid injections.  On exam today-she is easily able to move her legs side-to-side-and just about able to raise them off the bed (against gravity)-palpation-Per patient this is close to her usual baseline.  Continue therapy services at SNF.   HTN: BP stable-continue amlodipine and Coreg   HFpEF: Volume status stable-avoid diuretics given AKI.   History of severe AS-s/p aortic valve replacement with bioprosthetic valve    History of repair of ascending aortic aneurysm   Chronic hypoxic respiratory failure: On 2-3 L of oxygen at home   Obesity: Body mass index is 31.74 kg/m.    Pressure ulcer: Pressure Injury 11/20/20 Upper;Lower;Left Stage 3 -  Full thickness tissue loss. Subcutaneous fat may be visible but bone, tendon or muscle are NOT exposed. (Active)  11/20/20   Location:   Location Orientation: Upper;Lower;Left  Staging: Stage 3 -  Full thickness tissue loss. Subcutaneous fat may be  visible but bone, tendon or muscle are NOT exposed.  Wound Description (Comments):   Present on Admission: Yes     Pressure Injury 11/20/20 Right;Lower;Upper Unstageable - Full thickness tissue loss in which the base of the  injury is covered by slough (yellow, tan, gray, green or brown) and/or eschar (tan, brown or black) in the wound bed. (Active)  11/20/20   Location:   Location Orientation: Right;Lower;Upper  Staging: Unstageable - Full thickness tissue loss in which the base of the injury is covered by slough (yellow, tan, gray, green or brown) and/or eschar (tan, brown or black) in the wound bed.  Wound Description (Comments):   Present on Admission: Yes     Pressure Injury 11/21/20 Thigh Left;Posterior;Proximal Unstageable - Full thickness tissue loss in which the base of the injury is covered by slough (yellow, tan, gray, green or brown) and/or eschar (tan, brown or black) in the wound bed. (Active)  11/21/20 0800  Location: Thigh  Location Orientation: Left;Posterior;Proximal  Staging: Unstageable - Full thickness tissue loss in which the base of the injury is covered by slough (yellow, tan, gray, green or brown) and/or eschar (tan, brown or black) in the wound bed.  Wound Description (Comments):   Present on Admission: Yes     Pressure Injury 11/21/20 Buttocks Right Stage 2 -  Partial thickness loss of dermis presenting as a shallow open injury with a red, pink wound bed without slough. (Active)  11/21/20 0800  Location: Buttocks  Location Orientation: Right  Staging: Stage 2 -  Partial thickness loss of dermis presenting as a shallow open injury with a red, pink wound bed without slough.  Wound Description (Comments):   Present on Admission: Yes     Pressure Injury 11/21/20 Thigh Posterior;Proximal;Right Unstageable - Full thickness tissue loss in which the base of the injury is covered by slough (yellow, tan, gray, green or brown) and/or eschar (tan, brown or black) in the wound bed. (Active)  11/21/20 0800  Location: Thigh  Location Orientation: Posterior;Proximal;Right  Staging: Unstageable - Full thickness tissue loss in which the base of the injury is covered by slough (yellow, tan, gray,  green or brown) and/or eschar (tan, brown or black) in the wound bed.  Wound Description (Comments):   Present on Admission: Yes   RN pressure injury documentation: Pressure Injury 11/20/20 Upper;Lower;Left Stage 3 -  Full thickness tissue loss. Subcutaneous fat may be visible but bone, tendon or muscle are NOT exposed. (Active)  11/20/20   Location:   Location Orientation: Upper;Lower;Left  Staging: Stage 3 -  Full thickness tissue loss. Subcutaneous fat may be visible but bone, tendon or muscle are NOT exposed.  Wound Description (Comments):   Present on Admission: Yes     Pressure Injury 11/20/20 Right;Lower;Upper Unstageable - Full thickness tissue loss in which the base of the injury is covered by slough (yellow, tan, gray, green or brown) and/or eschar (tan, brown or black) in the wound bed. (Active)  11/20/20   Location:   Location Orientation: Right;Lower;Upper  Staging: Unstageable - Full thickness tissue loss in which the base of the injury is covered by slough (yellow, tan, gray, green or brown) and/or eschar (tan, brown or black) in the wound bed.  Wound Description (Comments):   Present on Admission: Yes     Pressure Injury 11/21/20 Thigh Left;Posterior;Proximal Unstageable - Full thickness tissue loss in which the base of the injury is covered by slough (yellow, tan, gray, green or brown) and/or eschar (tan, brown  or black) in the wound bed. (Active)  11/21/20 0800  Location: Thigh  Location Orientation: Left;Posterior;Proximal  Staging: Unstageable - Full thickness tissue loss in which the base of the injury is covered by slough (yellow, tan, gray, green or brown) and/or eschar (tan, brown or black) in the wound bed.  Wound Description (Comments):   Present on Admission: Yes     Pressure Injury 11/21/20 Buttocks Right Stage 2 -  Partial thickness loss of dermis presenting as a shallow open injury with a red, pink wound bed without slough. (Active)  11/21/20 0800   Location: Buttocks  Location Orientation: Right  Staging: Stage 2 -  Partial thickness loss of dermis presenting as a shallow open injury with a red, pink wound bed without slough.  Wound Description (Comments):   Present on Admission: Yes     Pressure Injury 11/21/20 Thigh Posterior;Proximal;Right Unstageable - Full thickness tissue loss in which the base of the injury is covered by slough (yellow, tan, gray, green or brown) and/or eschar (tan, brown or black) in the wound bed. (Active)  11/21/20 0800  Location: Thigh  Location Orientation: Posterior;Proximal;Right  Staging: Unstageable - Full thickness tissue loss in which the base of the injury is covered by slough (yellow, tan, gray, green or brown) and/or eschar (tan, brown or black) in the wound bed.  Wound Description (Comments):   Present on Admission: Yes    Procedures None  Discharge Diagnoses:  Active Problems:   Symptomatic anemia   AKI (acute kidney injury) ([place])   Chronic kidney disease   Pressure injury of skin   Fecal occult blood test positive   Discharge Instructions:  Activity:  As tolerated   Discharge Instructions     Call MD for:  difficulty breathing, headache or visual disturbances   Complete by: As directed    Diet - low sodium heart healthy   Complete by: As directed    Discharge wound care:   Complete by: As directed    1. Apply Santyl to right upper and lower buttock wounds Q day, then cover with moist gauze and foam dressings.  (Change foam dressings Q 3 days or PRN soiling.) 2. Foam dressing to left buttock wounds (upper and lower), change Q 3 days or PRN soiling.   Increase activity slowly   Complete by: As directed       Allergies as of 11/30/2020       Reactions   Methylprednisolone Other (See Comments)   Caused severe GERD in October 2018   Minoxidil Palpitations, Other (See Comments)   unable to sleep   Naproxen Swelling        Medication List     STOP taking these  medications    furosemide 80 MG tablet Commonly known as: LASIX   potassium chloride SA 20 MEQ tablet Commonly known as: KLOR-CON       TAKE these medications    amLODipine 5 MG tablet Commonly known as: NORVASC Take 5 mg by mouth at bedtime.   aspirin EC 81 MG tablet Take 81 mg by mouth daily.   calcitRIOL 0.25 MCG capsule Commonly known as: ROCALTROL Take 0.25 mcg by mouth See admin instructions. Take one capsule (0.25 mcg) by mouth twice weekly - Monday and Friday   CALCIUM CARBONATE ANTACID PO Take 2 tablets by mouth daily as needed for indigestion or heartburn.   capsicum 0.075 % topical cream Commonly known as: ZOSTRIX Apply topically 2 (two) times daily. Apply to B/L knees AVOID contact  with eyes and broken or irritated skin.   carvedilol 25 MG tablet Commonly known as: COREG Take 1 tablet (25 mg total) by mouth 2 (two) times daily with a meal.   Colchicine 0.6 MG Caps Take 0.6 mg by mouth as needed. Take every other day as needed during gout flare; not to exceed 3 doses per flare   cyanocobalamin 1000 MCG tablet Take 1,000 mcg by mouth daily.   DULoxetine 30 MG capsule Commonly known as: CYMBALTA Take 1 capsule (30 mg total) by mouth daily. Start taking on: December 01, 2020   feeding supplement (NEPRO CARB STEADY) Liqd Take 237 mLs by mouth 3 (three) times daily between meals.   fluticasone 50 MCG/ACT nasal spray Commonly known as: FLONASE Place 2 sprays into both nostrils daily as needed for allergies.   gabapentin 100 MG capsule Commonly known as: NEURONTIN Take 1 capsule (100 mg total) by mouth at bedtime.   methocarbamol 500 MG tablet Commonly known as: ROBAXIN Take 1 tablet (500 mg total) by mouth every 8 (eight) hours as needed for muscle spasms.   multivitamin tablet Take 1 tablet by mouth daily.   ondansetron 4 MG tablet Commonly known as: Zofran Take 1 tablet (4 mg total) by mouth every 8 (eight) hours as needed for nausea or  vomiting.   OXYGEN Inhale 2 L into the lungs as needed (shortness of breath).   pantoprazole 40 MG tablet Commonly known as: PROTONIX Take 1 tablet (40 mg total) by mouth daily at 6 (six) AM. Start taking on: December 01, 2020   Propylene Glycol 0.6 % Soln Place 1 drop into both eyes daily as needed (for dry eyes).   TART CHERRY PO Take 1 tablet by mouth daily.               Discharge Care Instructions  (From admission, onward)           Start     Ordered   11/30/20 0000  Discharge wound care:       Comments: 1. Apply Santyl to right upper and lower buttock wounds Q day, then cover with moist gauze and foam dressings.  (Change foam dressings Q 3 days or PRN soiling.) 2. Foam dressing to left buttock wounds (upper and lower), change Q 3 days or PRN soiling.   11/30/20 1111            Contact information for follow-up providers     Hoyt Koch, MD. Schedule an appointment as soon as possible for a visit in 1 week(s).   Specialty: Internal Medicine Contact information: [place] [address] [phone]         Minus Breeding, MD. Schedule an appointment as soon as possible for a visit in 1 month(s).   Specialty: Cardiology Contact information: [address] [place] [address] [phone]              Contact information for after-discharge care     Destination     [facility] Preferred SNF .   Service: Skilled Nursing Contact information: [address] [address] [phone]                    Allergies  Allergen Reactions   Methylprednisolone Other (See Comments)    Caused severe GERD in October 2018   Minoxidil Palpitations and Other (See Comments)    unable to sleep   Naproxen Swelling  Consultations: Renal Gastroenterology   Other Procedures/Studies: DG Knee 1-2 Views Left  Result Date: 11/19/2020 CLINICAL DATA:  Pain.  EXAM: LEFT KNEE - 1-2 VIEW COMPARISON:  10/22/2017. FINDINGS: Advanced tricompartmental degenerative osteoarthritic changes of the LEFT knee, with significant joint space loss and degenerative osteophyte formation. These degenerative changes are not significantly changed compared to the previous plain film of 10/22/2017. No fracture line or displaced fracture fragment. No acute-appearing cortical irregularity or osseous lesion. Probable joint effusion within the suprapatellar bursa. Superficial soft tissues about the LEFT knee are unremarkable. IMPRESSION: 1. Advanced degenerative osteoarthritic changes of the LEFT knee, not significantly changed compared to the previous plain film of 10/22/2017. 2. No acute findings. Electronically Signed   By: Franki Cabot M.D.   On: 11/19/2020 18:14   DG Knee 1-2 Views Right  Result Date: 11/19/2020 CLINICAL DATA:  Pain. EXAM: RIGHT KNEE - 1-2 VIEW COMPARISON:  Plain film of the RIGHT knee dated 10/22/2017. FINDINGS: Advanced degenerative osteoarthritic changes of the RIGHT knee, tricompartmental but most severe at the medial compartment, with associated joint space narrowing and osteophyte formation. No fracture line or displaced fracture fragment. No acute-appearing cortical irregularity or osseous lesion. Probable small joint effusion within the suprapatellar bursa, likely chronic. Superficial soft tissues about the RIGHT knee are unremarkable. IMPRESSION: 1. Advanced degenerative osteoarthritic changes of the RIGHT knee, tricompartmental but most severe at the medial compartment, not significantly changed compared to previous plain film of 10/22/2017. 2. Probable small joint effusion, likely chronic. 3. No acute findings. Electronically Signed   By: Franki Cabot M.D.   On: 11/19/2020 18:16   US RENAL  Result Date: 11/19/2020 CLINICAL DATA:  Acute kidney injury. EXAM: RENAL / URINARY TRACT ULTRASOUND COMPLETE COMPARISON:  Noncontrast CT 05/28/2017 FINDINGS: Right  Kidney: Renal measurements: 10.5 x 4.3 x 3.9 cm = volume: 92 mL. No hydronephrosis. Increased renal parenchymal echogenicity. 1 cm cyst in the anterior superior kidney. No visualized stone or solid lesion. Left Kidney: Renal measurements: 8.1 x 3.9 x 4.9 cm = volume: 81 mL. No hydronephrosis. Diffusely increased renal parenchymal echogenicity. Exophytic cyst arising from the lower kidney measures 5.9 x 4.9 x 5.4 cm. No visualized stone or solid lesion. Bladder: Appears normal for degree of bladder distention. Both ureteral jets are seen. Other: None. IMPRESSION: 1. No hydronephrosis or obstructive uropathy. 2. Increased renal parenchymal echogenicity typical of chronic medical renal disease. 3. Bilateral renal cysts. Electronically Signed   By: Keith Rake M.D.   On: 11/19/2020 20:00   DG Chest Port 1 View  Result Date: 11/20/2020 CLINICAL DATA:  Shortness of breath. EXAM: PORTABLE CHEST 1 VIEW COMPARISON:  Chest x-ray dated March 08, 2020. FINDINGS: Stable cardiomegaly status post AVR. Chronic mild pulmonary vascular congestion. No focal consolidation, pleural effusion, or pneumothorax. No acute osseous abnormality. IMPRESSION: 1. Chronic mild pulmonary vascular congestion. Electronically Signed   By: Titus Dubin M.D.   On: 11/20/2020 07:31   DG Abd Portable 1V  Result Date: 11/21/2020 CLINICAL DATA:  Diarrhea. EXAM: PORTABLE ABDOMEN - 1 VIEW COMPARISON:  05/31/2017 FINDINGS: The lung bases are not included. The bowel gas pattern is unremarkable. No findings for obstruction. The soft tissue shadows are grossly maintained. No worrisome calcifications. The bony structures are unremarkable. IMPRESSION: Unremarkable abdominal radiographs. Electronically Signed   By: Marijo Sanes M.D.   On: 11/21/2020 14:49     TODAY-DAY OF DISCHARGE:  Subjective:   [name] today has no headache,no chest abdominal pain,no new weakness tingling or numbness,  feels much better wants to go home today.    Objective:   Blood pressure 136/66, pulse 95, temperature 99.2 F (37.3 C), temperature source Oral, resp. rate 20, height 5\' 6"  (1.676 m), weight 89.2 kg, SpO2 96 %.  Intake/Output Summary (Last 24 hours) at 11/30/2020 1113 Last data filed at 11/30/2020 0500 Gross per 24 hour  Intake 360 ml  Output 1150 ml  Net -790 ml   Filed Weights   11/19/20 1217 11/20/20 2327  Weight: 77.1 kg 89.2 kg    Exam: Awake Alert, Oriented *3, No new F.N deficits, Normal affect [place].AT,PERRAL Supple Neck,No JVD, No cervical lymphadenopathy appriciated.  Symmetrical Chest wall movement, Good air movement bilaterally, CTAB RRR,No Gallops,Rubs or new Murmurs, No Parasternal Heave +ve B.Sounds, Abd Soft, Non tender, No organomegaly appriciated, No rebound -guarding or rigidity. No Cyanosis, Clubbing or edema, No new Rash or bruise   PERTINENT RADIOLOGIC STUDIES: No results found.   PERTINENT LAB RESULTS: CBC: No results for input(s): WBC, HGB, HCT, PLT in the last 72 hours. CMET CMP     Component Value Date/Time   NA 146 (H) 11/30/2020 0120   NA 142 01/13/2019 1518   NA 144 03/05/2017 1238   K 4.1 11/30/2020 0120   K 3.9 03/05/2017 1238   CL 117 (H) 11/30/2020 0120   CO2 21 (L) 11/30/2020 0120   CO2 26 03/05/2017 1238   GLUCOSE 118 (H) 11/30/2020 0120   GLUCOSE 97 03/05/2017 1238   BUN 129 (H) 11/30/2020 0120   BUN 87 (HH) 01/13/2019 1518   BUN 37.5 (H) 03/05/2017 1238   CREATININE 4.02 (H) 11/30/2020 0120   CREATININE 3.1 (HH) 03/05/2017 1238   CALCIUM 9.1 11/30/2020 0120   CALCIUM 10.1 03/05/2017 1238   PROT 6.6 11/22/2020 0019   PROT 6.5 04/24/2017 1356   PROT 7.3 03/05/2017 1238   ALBUMIN 2.6 (L) 11/30/2020 0120   ALBUMIN 3.1 (L) 03/05/2017 1238   AST 22 11/22/2020 0019   AST 30 03/05/2017 1238   ALT 21 11/22/2020 0019   ALT 26 03/05/2017 1238   ALKPHOS 54 11/22/2020 0019   ALKPHOS 78 03/05/2017 1238   BILITOT 1.4 (H) 11/22/2020 0019   BILITOT 0.51 03/05/2017 1238    GFRNONAA 11 (L) 11/30/2020 0120   GFRAA 21 (L) 06/09/2019 0340    GFR Estimated Creatinine Clearance: 13.6 mL/min (A) (by C-G formula based on SCr of 4.02 mg/dL (H)). No results for input(s): LIPASE, AMYLASE in the last 72 hours. No results for input(s): CKTOTAL, CKMB, CKMBINDEX, TROPONINI in the last 72 hours. Invalid input(s): POCBNP No results for input(s): DDIMER in the last 72 hours. No results for input(s): HGBA1C in the last 72 hours. No results for input(s): CHOL, HDL, LDLCALC, TRIG, CHOLHDL, LDLDIRECT in the last 72 hours. No results for input(s): TSH, T4TOTAL, T3FREE, THYROIDAB in the last 72 hours.  Invalid input(s): FREET3 No results for input(s): VITAMINB12, FOLATE, FERRITIN, TIBC, IRON, RETICCTPCT in the last 72 hours. Coags: No results for input(s): INR in the last 72 hours.  Invalid input(s): PT Microbiology: Recent Results (from the past 240 hour(s))  Gastrointestinal Panel by PCR , Stool     Status: None   Collection Time: 11/20/20  6:48 PM   Specimen: Stool  Result Value Ref Range Status   Campylobacter species NOT DETECTED NOT DETECTED Final   Plesimonas shigelloides NOT DETECTED NOT DETECTED Final   Salmonella species NOT DETECTED NOT DETECTED Final   Yersinia enterocolitica NOT DETECTED NOT DETECTED Final  Vibrio species NOT DETECTED NOT DETECTED Final   Vibrio cholerae NOT DETECTED NOT DETECTED Final   Enteroaggregative E coli (EAEC) NOT DETECTED NOT DETECTED Final   Enteropathogenic E coli (EPEC) NOT DETECTED NOT DETECTED Final   Enterotoxigenic E coli (ETEC) NOT DETECTED NOT DETECTED Final   Shiga like toxin producing E coli (STEC) NOT DETECTED NOT DETECTED Final   Shigella/Enteroinvasive E coli (EIEC) NOT DETECTED NOT DETECTED Final   Cryptosporidium NOT DETECTED NOT DETECTED Final   Cyclospora cayetanensis NOT DETECTED NOT DETECTED Final   Entamoeba histolytica NOT DETECTED NOT DETECTED Final   Giardia lamblia NOT DETECTED NOT DETECTED Final    Adenovirus F40/41 NOT DETECTED NOT DETECTED Final   Astrovirus NOT DETECTED NOT DETECTED Final   Norovirus GI/GII NOT DETECTED NOT DETECTED Final   Rotavirus A NOT DETECTED NOT DETECTED Final   Sapovirus (I, II, IV, and V) NOT DETECTED NOT DETECTED Final    Comment: Performed at [facility], [address] [place], [place] [address]  C Difficile Quick Screen w PCR reflex     Status: None   Collection Time: 11/20/20  6:48 PM   Specimen: STOOL  Result Value Ref Range Status   C Diff antigen NEGATIVE NEGATIVE Final   C Diff toxin NEGATIVE NEGATIVE Final   C Diff interpretation No C. difficile detected.  Final    Comment: Performed at [facility], [place] [address] [place], [place] [address]    FURTHER DISCHARGE INSTRUCTIONS:  Get Medicines reviewed and adjusted: Please take all your medications with you for your next visit with your Primary MD  Laboratory/radiological data: Please request your Primary MD to go over all hospital tests and procedure/radiological results at the follow up, please ask your Primary MD to get all Hospital records sent to his/her office.  In some cases, they will be blood work, cultures and biopsy results pending at the time of your discharge. Please request that your primary care M.D. goes through all the records of your hospital data and follows up on these results.  Also Note the following: If you experience worsening of your admission symptoms, develop shortness of breath, life threatening emergency, suicidal or homicidal thoughts you must seek medical attention immediately by calling 911 or calling your MD immediately  if symptoms less severe.  You must read complete instructions/literature along with all the possible adverse reactions/side effects for all the Medicines you take and that have been prescribed to you. Take any new Medicines after you have completely understood and accpet all the possible adverse reactions/side effects.   Do  not drive when taking Pain medications or sleeping medications (Benzodaizepines)  Do not take more than prescribed Pain, Sleep and Anxiety Medications. It is not advisable to combine anxiety,sleep and pain medications without talking with your primary care practitioner  Special Instructions: If you have smoked or chewed Tobacco  in the last 2 yrs please stop smoking, stop any regular Alcohol  and or any Recreational drug use.  Wear Seat belts while driving.  Please note: You were cared for by a hospitalist during your hospital stay. Once you are discharged, your primary care physician will handle any further medical issues. Please note that NO REFILLS for any discharge medications will be authorized once you are discharged, as it is imperative that you return to your primary care physician (or establish a relationship with a primary care physician if you do not have one) for your post hospital discharge needs so that they  can reassess your need for medications and monitor your lab values.  Total Time spent coordinating discharge including counseling, education and face to face time equals 35 minutes.  Signed: Thressa Shiffer 11/30/2020 11:13 AM

## 2020-11-30 NOTE — Progress Notes (Signed)
Report attempted to SNF. VM left with call back number

## 2020-11-30 NOTE — TOC Progression Note (Addendum)
Transition of Care [facility]) - Progression Note    Patient Details  Name: [name] MRN: [id number] Date of Birth: May 25, 1945  [facility] CM/SW [place], LCSW Phone Number: 11/30/2020, 9:18 AM  Clinical Narrative:    9am-CSW initiated a new authorization for [facility] Ref # [id number].   1pm-Insurance approval received: [id number], effective 11/30/20-11/20/2020.   Expected Discharge Plan: Skilled Nursing Facility Barriers to Discharge: Ship broker, Continued Medical Work up  Expected Discharge Plan and Services Expected Discharge Plan: [place] In-house Referral: Clinical Social Work Discharge Planning Services: CM Consult Post Acute Care Choice: [place] Living arrangements for the past 2 months: Single Family Home                                       Social Determinants of Health (SDOH) Interventions    Readmission Risk Interventions Readmission Risk Prevention Plan 07/15/2018  Post Dischage Appt Complete  Medication Screening Complete  Transportation Screening Complete  Some recent data might be hidden

## 2020-11-30 NOTE — Progress Notes (Signed)
Covid swab collected and sent to lab.

## 2020-11-30 NOTE — Progress Notes (Signed)
Pt left with PTAR. All belongings given to patient/husband. AVS given to EMS

## 2020-12-01 ENCOUNTER — Telehealth: Payer: Medicare Other

## 2020-12-01 DIAGNOSIS — N184 Chronic kidney disease, stage 4 (severe): Secondary | ICD-10-CM | POA: Diagnosis not present

## 2020-12-01 DIAGNOSIS — R0602 Shortness of breath: Secondary | ICD-10-CM | POA: Diagnosis not present

## 2020-12-01 DIAGNOSIS — M6281 Muscle weakness (generalized): Secondary | ICD-10-CM | POA: Diagnosis not present

## 2020-12-01 DIAGNOSIS — N179 Acute kidney failure, unspecified: Secondary | ICD-10-CM | POA: Diagnosis not present

## 2020-12-01 DIAGNOSIS — I5032 Chronic diastolic (congestive) heart failure: Secondary | ICD-10-CM | POA: Diagnosis not present

## 2020-12-01 DIAGNOSIS — G629 Polyneuropathy, unspecified: Secondary | ICD-10-CM | POA: Diagnosis not present

## 2020-12-01 DIAGNOSIS — G894 Chronic pain syndrome: Secondary | ICD-10-CM | POA: Diagnosis not present

## 2020-12-04 ENCOUNTER — Emergency Department (HOSPITAL_COMMUNITY): Payer: Medicare Other

## 2020-12-04 ENCOUNTER — Inpatient Hospital Stay (HOSPITAL_COMMUNITY)
Admission: EM | Admit: 2020-12-04 | Discharge: 2021-01-06 | DRG: 682 | Disposition: E | Payer: Medicare Other | Attending: Internal Medicine | Admitting: Internal Medicine

## 2020-12-04 ENCOUNTER — Other Ambulatory Visit: Payer: Self-pay

## 2020-12-04 ENCOUNTER — Encounter (HOSPITAL_COMMUNITY): Payer: Self-pay

## 2020-12-04 DIAGNOSIS — J9621 Acute and chronic respiratory failure with hypoxia: Secondary | ICD-10-CM | POA: Diagnosis present

## 2020-12-04 DIAGNOSIS — N179 Acute kidney failure, unspecified: Secondary | ICD-10-CM | POA: Diagnosis not present

## 2020-12-04 DIAGNOSIS — I132 Hypertensive heart and chronic kidney disease with heart failure and with stage 5 chronic kidney disease, or end stage renal disease: Secondary | ICD-10-CM | POA: Diagnosis not present

## 2020-12-04 DIAGNOSIS — G9341 Metabolic encephalopathy: Secondary | ICD-10-CM | POA: Diagnosis present

## 2020-12-04 DIAGNOSIS — Z90711 Acquired absence of uterus with remaining cervical stump: Secondary | ICD-10-CM

## 2020-12-04 DIAGNOSIS — E872 Acidosis: Secondary | ICD-10-CM | POA: Diagnosis not present

## 2020-12-04 DIAGNOSIS — Z8371 Family history of colonic polyps: Secondary | ICD-10-CM | POA: Diagnosis not present

## 2020-12-04 DIAGNOSIS — M170 Bilateral primary osteoarthritis of knee: Secondary | ICD-10-CM | POA: Diagnosis present

## 2020-12-04 DIAGNOSIS — Z6830 Body mass index (BMI) 30.0-30.9, adult: Secondary | ICD-10-CM | POA: Diagnosis not present

## 2020-12-04 DIAGNOSIS — Z139 Encounter for screening, unspecified: Secondary | ICD-10-CM | POA: Diagnosis not present

## 2020-12-04 DIAGNOSIS — D649 Anemia, unspecified: Secondary | ICD-10-CM | POA: Diagnosis not present

## 2020-12-04 DIAGNOSIS — Z9981 Dependence on supplemental oxygen: Secondary | ICD-10-CM

## 2020-12-04 DIAGNOSIS — E669 Obesity, unspecified: Secondary | ICD-10-CM | POA: Diagnosis present

## 2020-12-04 DIAGNOSIS — I959 Hypotension, unspecified: Secondary | ICD-10-CM | POA: Diagnosis present

## 2020-12-04 DIAGNOSIS — N186 End stage renal disease: Secondary | ICD-10-CM | POA: Diagnosis not present

## 2020-12-04 DIAGNOSIS — Z79899 Other long term (current) drug therapy: Secondary | ICD-10-CM

## 2020-12-04 DIAGNOSIS — L89313 Pressure ulcer of right buttock, stage 3: Secondary | ICD-10-CM | POA: Diagnosis not present

## 2020-12-04 DIAGNOSIS — L89893 Pressure ulcer of other site, stage 3: Secondary | ICD-10-CM | POA: Diagnosis not present

## 2020-12-04 DIAGNOSIS — D631 Anemia in chronic kidney disease: Secondary | ICD-10-CM | POA: Diagnosis present

## 2020-12-04 DIAGNOSIS — Z8249 Family history of ischemic heart disease and other diseases of the circulatory system: Secondary | ICD-10-CM | POA: Diagnosis not present

## 2020-12-04 DIAGNOSIS — Z87891 Personal history of nicotine dependence: Secondary | ICD-10-CM

## 2020-12-04 DIAGNOSIS — I422 Other hypertrophic cardiomyopathy: Secondary | ICD-10-CM | POA: Diagnosis present

## 2020-12-04 DIAGNOSIS — Z66 Do not resuscitate: Secondary | ICD-10-CM | POA: Diagnosis present

## 2020-12-04 DIAGNOSIS — Z953 Presence of xenogenic heart valve: Secondary | ICD-10-CM

## 2020-12-04 DIAGNOSIS — R54 Age-related physical debility: Secondary | ICD-10-CM | POA: Diagnosis present

## 2020-12-04 DIAGNOSIS — L89300 Pressure ulcer of unspecified buttock, unstageable: Secondary | ICD-10-CM | POA: Diagnosis not present

## 2020-12-04 DIAGNOSIS — Z87440 Personal history of urinary (tract) infections: Secondary | ICD-10-CM | POA: Diagnosis not present

## 2020-12-04 DIAGNOSIS — Z833 Family history of diabetes mellitus: Secondary | ICD-10-CM | POA: Diagnosis not present

## 2020-12-04 DIAGNOSIS — E875 Hyperkalemia: Secondary | ICD-10-CM | POA: Diagnosis present

## 2020-12-04 DIAGNOSIS — Z743 Need for continuous supervision: Secondary | ICD-10-CM | POA: Diagnosis not present

## 2020-12-04 DIAGNOSIS — Z7189 Other specified counseling: Secondary | ICD-10-CM | POA: Diagnosis not present

## 2020-12-04 DIAGNOSIS — Z515 Encounter for palliative care: Secondary | ICD-10-CM | POA: Diagnosis not present

## 2020-12-04 DIAGNOSIS — Z993 Dependence on wheelchair: Secondary | ICD-10-CM

## 2020-12-04 DIAGNOSIS — J90 Pleural effusion, not elsewhere classified: Secondary | ICD-10-CM | POA: Diagnosis not present

## 2020-12-04 DIAGNOSIS — N189 Chronic kidney disease, unspecified: Secondary | ICD-10-CM | POA: Diagnosis not present

## 2020-12-04 DIAGNOSIS — Z888 Allergy status to other drugs, medicaments and biological substances status: Secondary | ICD-10-CM

## 2020-12-04 DIAGNOSIS — Z86010 Personal history of colonic polyps: Secondary | ICD-10-CM

## 2020-12-04 DIAGNOSIS — I5033 Acute on chronic diastolic (congestive) heart failure: Secondary | ICD-10-CM

## 2020-12-04 DIAGNOSIS — R609 Edema, unspecified: Secondary | ICD-10-CM | POA: Diagnosis not present

## 2020-12-04 DIAGNOSIS — M79603 Pain in arm, unspecified: Secondary | ICD-10-CM | POA: Diagnosis not present

## 2020-12-04 DIAGNOSIS — I272 Pulmonary hypertension, unspecified: Secondary | ICD-10-CM | POA: Diagnosis present

## 2020-12-04 DIAGNOSIS — G629 Polyneuropathy, unspecified: Secondary | ICD-10-CM | POA: Diagnosis present

## 2020-12-04 DIAGNOSIS — Z7982 Long term (current) use of aspirin: Secondary | ICD-10-CM

## 2020-12-04 DIAGNOSIS — R0602 Shortness of breath: Secondary | ICD-10-CM | POA: Diagnosis not present

## 2020-12-04 DIAGNOSIS — L89310 Pressure ulcer of right buttock, unstageable: Secondary | ICD-10-CM | POA: Diagnosis not present

## 2020-12-04 DIAGNOSIS — I872 Venous insufficiency (chronic) (peripheral): Secondary | ICD-10-CM | POA: Diagnosis present

## 2020-12-04 DIAGNOSIS — M5412 Radiculopathy, cervical region: Secondary | ICD-10-CM | POA: Diagnosis present

## 2020-12-04 DIAGNOSIS — Z8679 Personal history of other diseases of the circulatory system: Secondary | ICD-10-CM

## 2020-12-04 DIAGNOSIS — I447 Left bundle-branch block, unspecified: Secondary | ICD-10-CM | POA: Diagnosis present

## 2020-12-04 DIAGNOSIS — M109 Gout, unspecified: Secondary | ICD-10-CM | POA: Diagnosis present

## 2020-12-04 DIAGNOSIS — Z5329 Procedure and treatment not carried out because of patient's decision for other reasons: Secondary | ICD-10-CM | POA: Diagnosis present

## 2020-12-04 DIAGNOSIS — L89890 Pressure ulcer of other site, unstageable: Secondary | ICD-10-CM | POA: Diagnosis not present

## 2020-12-04 DIAGNOSIS — I10 Essential (primary) hypertension: Secondary | ICD-10-CM | POA: Diagnosis not present

## 2020-12-04 LAB — CBC WITH DIFFERENTIAL/PLATELET
Abs Immature Granulocytes: 0.11 10*3/uL — ABNORMAL HIGH (ref 0.00–0.07)
Basophils Absolute: 0 10*3/uL (ref 0.0–0.1)
Basophils Relative: 0 %
Eosinophils Absolute: 0.2 10*3/uL (ref 0.0–0.5)
Eosinophils Relative: 2 %
HCT: 22.5 % — ABNORMAL LOW (ref 36.0–46.0)
Hemoglobin: 6.7 g/dL — CL (ref 12.0–15.0)
Immature Granulocytes: 1 %
Lymphocytes Relative: 7 %
Lymphs Abs: 0.7 10*3/uL (ref 0.7–4.0)
MCH: 30.2 pg (ref 26.0–34.0)
MCHC: 29.8 g/dL — ABNORMAL LOW (ref 30.0–36.0)
MCV: 101.4 fL — ABNORMAL HIGH (ref 80.0–100.0)
Monocytes Absolute: 0.4 10*3/uL (ref 0.1–1.0)
Monocytes Relative: 5 %
Neutro Abs: 7.5 10*3/uL (ref 1.7–7.7)
Neutrophils Relative %: 85 %
Platelets: 168 10*3/uL (ref 150–400)
RBC: 2.22 MIL/uL — ABNORMAL LOW (ref 3.87–5.11)
RDW: 18.2 % — ABNORMAL HIGH (ref 11.5–15.5)
WBC: 8.9 10*3/uL (ref 4.0–10.5)
nRBC: 0 % (ref 0.0–0.2)

## 2020-12-04 LAB — BASIC METABOLIC PANEL
Anion gap: 12 (ref 5–15)
BUN: 165 mg/dL — ABNORMAL HIGH (ref 8–23)
CO2: 20 mmol/L — ABNORMAL LOW (ref 22–32)
Calcium: 8.7 mg/dL — ABNORMAL LOW (ref 8.9–10.3)
Chloride: 113 mmol/L — ABNORMAL HIGH (ref 98–111)
Creatinine, Ser: 6.32 mg/dL — ABNORMAL HIGH (ref 0.44–1.00)
GFR, Estimated: 6 mL/min — ABNORMAL LOW (ref >60)
Glucose, Bld: 94 mg/dL (ref 70–99)
Potassium: 5.5 mmol/L — ABNORMAL HIGH (ref 3.5–5.1)
Sodium: 145 mmol/L (ref 135–145)

## 2020-12-04 LAB — BRAIN NATRIURETIC PEPTIDE: B Natriuretic Peptide: 399.9 pg/mL — ABNORMAL HIGH (ref 0.0–100.0)

## 2020-12-04 LAB — CBG MONITORING, ED: Glucose-Capillary: 83 mg/dL (ref 70–99)

## 2020-12-04 LAB — PREPARE RBC (CROSSMATCH)

## 2020-12-04 MED ORDER — CALCITRIOL 0.25 MCG PO CAPS: 0.2500 ug | ORAL_CAPSULE | ORAL | Status: DC

## 2020-12-04 MED ORDER — PANTOPRAZOLE SODIUM 40 MG PO TBEC
40.0000 mg | DELAYED_RELEASE_TABLET | Freq: Every day | ORAL | Status: DC
  Administered 2020-12-05: 40 mg via ORAL
  Filled 2020-12-04 (×2): qty 1

## 2020-12-04 MED ORDER — DULOXETINE HCL 30 MG PO CPEP
30.0000 mg | ORAL_CAPSULE | Freq: Every day | ORAL | Status: DC
  Administered 2020-12-05: 30 mg via ORAL
  Filled 2020-12-04 (×2): qty 1

## 2020-12-04 MED ORDER — NEPRO/CARBSTEADY PO LIQD
237.0000 mL | Freq: Three times a day (TID) | ORAL | Status: DC
  Administered 2020-12-05 (×3): 237 mL via ORAL
  Filled 2020-12-04: qty 237

## 2020-12-04 MED ORDER — CAPSAICIN 0.075 % EX CREA: TOPICAL_CREAM | Freq: Two times a day (BID) | CUTANEOUS | Status: DC

## 2020-12-04 MED ORDER — POLYVINYL ALCOHOL 1.4 % OP SOLN: 1.0000 [drp] | Freq: Every day | OPHTHALMIC | Status: DC | PRN

## 2020-12-04 MED ORDER — GABAPENTIN 100 MG PO CAPS
100.0000 mg | ORAL_CAPSULE | Freq: Every day | ORAL | Status: DC
  Administered 2020-12-04 – 2020-12-05 (×2): 100 mg via ORAL
  Filled 2020-12-04 (×2): qty 1

## 2020-12-04 MED ORDER — ASPIRIN EC 81 MG PO TBEC
81.0000 mg | DELAYED_RELEASE_TABLET | Freq: Every day | ORAL | Status: DC
  Administered 2020-12-05: 81 mg via ORAL
  Filled 2020-12-04 (×2): qty 1

## 2020-12-04 MED ORDER — SODIUM ZIRCONIUM CYCLOSILICATE 10 G PO PACK
10.0000 g | PACK | Freq: Once | ORAL | Status: AC
  Administered 2020-12-04: 10 g via ORAL
  Filled 2020-12-04: qty 1

## 2020-12-04 MED ORDER — CALCIUM GLUCONATE 10 % IV SOLN
1.0000 g | Freq: Once | INTRAVENOUS | Status: AC
  Administered 2020-12-04: 1 g via INTRAVENOUS
  Filled 2020-12-04: qty 10

## 2020-12-04 MED ORDER — SODIUM CHLORIDE 0.9 % IV SOLN
10.0000 mL/h | Freq: Once | INTRAVENOUS | Status: AC
  Administered 2020-12-04: 10 mL/h via INTRAVENOUS

## 2020-12-04 MED ORDER — PANTOPRAZOLE 80MG IVPB - SIMPLE MED
80.0000 mg | Freq: Once | INTRAVENOUS | Status: AC
  Administered 2020-12-04: 80 mg via INTRAVENOUS
  Filled 2020-12-04: qty 80

## 2020-12-04 MED ORDER — ONDANSETRON HCL 4 MG/2ML IJ SOLN
4.0000 mg | Freq: Four times a day (QID) | INTRAMUSCULAR | Status: DC | PRN
  Administered 2020-12-06: 4 mg via INTRAVENOUS
  Filled 2020-12-04: qty 2

## 2020-12-04 MED ORDER — METHOCARBAMOL 500 MG PO TABS
500.0000 mg | ORAL_TABLET | Freq: Three times a day (TID) | ORAL | Status: DC | PRN
  Administered 2020-12-05: 500 mg via ORAL
  Filled 2020-12-04: qty 1

## 2020-12-04 MED ORDER — INSULIN ASPART 100 UNIT/ML IV SOLN
5.0000 [IU] | Freq: Once | INTRAVENOUS | Status: AC
  Administered 2020-12-04: 5 [IU] via INTRAVENOUS

## 2020-12-04 MED ORDER — FLUTICASONE PROPIONATE 50 MCG/ACT NA SUSP: 2.0000 | Freq: Every day | NASAL | Status: DC | PRN

## 2020-12-04 MED ORDER — ONDANSETRON HCL 4 MG PO TABS: 4.0000 mg | ORAL_TABLET | Freq: Four times a day (QID) | ORAL | Status: DC | PRN

## 2020-12-04 MED ORDER — VITAMIN B-12 1000 MCG PO TABS
1000.0000 ug | ORAL_TABLET | Freq: Every day | ORAL | Status: DC
  Administered 2020-12-05 – 2020-12-06 (×2): 1000 ug via ORAL
  Filled 2020-12-04 (×4): qty 1

## 2020-12-04 MED ORDER — HEPARIN SODIUM (PORCINE) 5000 UNIT/ML IJ SOLN
5000.0000 [IU] | Freq: Two times a day (BID) | INTRAMUSCULAR | Status: DC
  Administered 2020-12-04 – 2020-12-06 (×4): 5000 [IU] via SUBCUTANEOUS
  Filled 2020-12-04 (×4): qty 1

## 2020-12-04 MED ORDER — DEXTROSE 50 % IV SOLN
1.0000 | Freq: Once | INTRAVENOUS | Status: AC
  Administered 2020-12-04: 50 mL via INTRAVENOUS
  Filled 2020-12-04: qty 50

## 2020-12-04 NOTE — ED Provider Notes (Signed)
Emergency Medicine Provider Triage Evaluation Note  [name] , a 75 y.o. female  was evaluated in triage.  Pt complains of sob.  Review of Systems  Positive: Sob, fluid retention, fatigue Negative: Fever, cp, no arm pain  Physical Exam  There were no vitals taken for this visit. Gen:   Awake, no distress   Resp:  Normal effort  MSK:   Moves extremities without difficulty  Other:    Medical Decision Making  Medically screening exam initiated at 12:05 PM.  Appropriate orders placed.  [name] was informed that the remainder of the evaluation will be completed by another provider, this initial triage assessment does not replace that evaluation, and the importance of remaining in the ED until their evaluation is complete.  Pt sent from nursing facility due to worsening SOB since yesterday.  Have notice increase fluid gain.     Domenic Moras, PA-C 11/20/2020 1206    Godfrey Pick, MD 12/06/20 [phone]

## 2020-12-04 NOTE — ED Provider Notes (Signed)
ATTENDING SUPERVISORY NOTE I have personally viewed the imaging studies performed. I have personally seen and examined the patient, and discussed the plan of care with the resident.  I have reviewed the documentation of the resident and agree.  No diagnosis found.  .Critical Care  Date/Time: 11/18/2020 5:39 PM Performed by: Elnora Morrison, MD Authorized by: Elnora Morrison, MD   Critical care provider statement:    Critical care time (minutes):  40   Critical care start time:  11/15/2020 4:00 PM   Critical care end time:  11/18/2020 4:40 PM   Critical care time was exclusive of:  Separately billable procedures and treating other patients and teaching time   Critical care was necessary to treat or prevent imminent or life-threatening deterioration of the following conditions:  Renal failure   Critical care was time spent personally by me on the following activities:  Evaluation of patient's response to treatment, examination of patient, ordering and performing treatments and interventions, ordering and review of laboratory studies, ordering and review of radiographic studies, pulse oximetry, re-evaluation of patient's condition and review of old charts    Elnora Morrison, MD 12/06/20 0010

## 2020-12-04 NOTE — H&P (Signed)
History and Physical    [name] WYO:[id number] DOB: 1945-04-20 DOA: 11/29/2020  PCP: Hoyt Koch, MD (Confirm with patient/family/NH records and if not entered, this has to be entered at [facility] point of entry) Patient coming from: SNF  I have personally briefly reviewed patient's old medical records in [place]  Chief Complaint: SOB, feeling tired.  HPI: [name] is a 75 y.o. female with medical history significant of CKD stage IV, chronic diastolic CHF severe hypertrophic cardiomyopathy hypoxia baseline 2 to 3 L, severe AS s/p biosynthetic aortic valve replacement, severe bilateral knee OA, chronic anemia secondary to CKD, gout, came with increasing shortness of breath, confusion, poor oral intake and constant for the nausea.  Patient was recently hospitalized for similar presentation, was diagnosed with AKI on CKD, on same admission, patient was offered HD, however patient declined offer.  Today, patient was sent for evaluation of increasing shortness of breath and confusion.  Patient reported that last 3 to 4 days, appetite and oral intake has significantly decreased, feeling nausea, but no vomiting and no abd pain or back pain.  ED Course: BP borderline low, oxygen at baseline.  Blood work showed AKI with creatinine 6.7 compared to baseline around 4.0.  Chest x-ray showed persistent pulm congestion.  Potassium 5.5.Marland Kitchen  Patient was given cocktail of D50, Insulin, calcium gluconate, resin.  Hemoglobin 6.7  Review of Systems: As per HPI otherwise 14 point review of systems negative.    Past Medical History:  Diagnosis Date   Adenomatous colon polyp 01/2002   Anemia, chronic disease    /notes 02/04/2017   Aortic valve disease    AI/AS   Arthritis    "lower back, knees" (02/04/2017)   CHF (congestive heart failure) (HCC)    CKD (chronic kidney disease), stage III (HCC)    Diverticulosis of colon    DJD (degenerative joint disease)    Frequent UTI     Hemorrhoids    Hypertension    Lumbar back pain    Neuropathy    Obesity    Pericardial effusion     in a patient with Diastolic heart  failure and Pericardial effusion  known since last 2 D echo in 11/2016 /notes 02/04/2017   PONV (postoperative nausea and vomiting)    Pulmonary HTN (HCC)    Renal cyst    Venous insufficiency    Vitamin D deficiency     Past Surgical History:  Procedure Laterality Date   ABDOMINAL HYSTERECTOMY     "partial"   AORTIC VALVE REPLACEMENT N/A 05/30/2017   Procedure: AORTIC VALVE REPLACEMENT (AVR);  Surgeon: Prescott Gum, Collier Salina, MD;  Location: [facility] OR;  Service: Vascular;  Laterality: N/A;   COLONOSCOPY W/ BIOPSIES AND POLYPECTOMY     "bx was ok"   IR THORACENTESIS ASP PLEURAL SPACE W/IMG GUIDE  06/11/2017   IR THORACENTESIS ASP PLEURAL SPACE W/IMG GUIDE  06/12/2017   REPAIR OF ACUTE ASCENDING THORACIC AORTIC DISSECTION N/A 05/30/2017   Procedure: REPLACEMENT OF ASCENDING AORTIC ANEURYSM AND REPAIRED CHRONIC ROOT DISSECTION WITH CIRC ARREST;  Surgeon: Ivin Poot, MD;  Location: [place];  Service: Vascular;  Laterality: N/A;   TEE WITHOUT CARDIOVERSION N/A 05/28/2017   Procedure: TRANSESOPHAGEAL ECHOCARDIOGRAM (TEE);  Surgeon: Skeet Latch, MD;  Location: [place];  Service: Cardiovascular;  Laterality: N/A;   TEE WITHOUT CARDIOVERSION N/A 05/30/2017   Procedure: TRANSESOPHAGEAL ECHOCARDIOGRAM (TEE);  Surgeon: Prescott Gum, Collier Salina, MD;  Location: [place];  Service: Open Heart Surgery;  Laterality: N/A;  reports that she quit smoking about 38 years ago. Her smoking use included cigarettes. She has a 6.00 pack-year smoking history. She has never used smokeless tobacco. She reports that she does not drink alcohol and does not use drugs.  Allergies  Allergen Reactions   Methylprednisolone Other (See Comments)    Caused severe GERD in October 2018   Minoxidil Palpitations and Other (See Comments)    unable to sleep   Naproxen Swelling    Family History   Problem Relation Age of Onset   Diabetes Mother    Colon polyps Mother    Hypertension Mother    Hypertension Brother    Hypertension Sister    Colon cancer Neg Hx      Prior to Admission medications   Medication Sig Start Date End Date Taking? Authorizing Provider  amLODipine (NORVASC) 5 MG tablet Take 5 mg by mouth at bedtime. 05/07/18   [provider]  aspirin EC 81 MG tablet Take 81 mg by mouth daily.    [provider]  calcitRIOL (ROCALTROL) 0.25 MCG capsule Take 0.25 mcg by mouth See admin instructions. Take one capsule (0.25 mcg) by mouth twice weekly - Monday and Friday    [provider]  CALCIUM CARBONATE ANTACID PO Take 2 tablets by mouth daily as needed for indigestion or heartburn.     [provider]  capsicum (ZOSTRIX) 0.075 % topical cream Apply topically 2 (two) times daily. Apply to B/L knees AVOID contact with eyes and broken or irritated skin. 11/30/20   Ghimire, Henreitta Leber, MD  carvedilol (COREG) 25 MG tablet Take 1 tablet (25 mg total) by mouth 2 (two) times daily with a meal. 07/16/18   Aline August, MD  Colchicine 0.6 MG CAPS Take 0.6 mg by mouth as needed. Take every other day as needed during gout flare; not to exceed 3 doses per flare 03/22/20   [provider]  cyanocobalamin 1000 MCG tablet Take 1,000 mcg by mouth daily.    [provider]  DULoxetine (CYMBALTA) 30 MG capsule Take 1 capsule (30 mg total) by mouth daily. 12/01/20   Ghimire, Henreitta Leber, MD  fluticasone (FLONASE) 50 MCG/ACT nasal spray Place 2 sprays into both nostrils daily as needed for allergies. 07/16/18   Aline August, MD  gabapentin (NEURONTIN) 100 MG capsule Take 1 capsule (100 mg total) by mouth at bedtime. 02/04/20   Lyndal Pulley, DO  methocarbamol (ROBAXIN) 500 MG tablet Take 1 tablet (500 mg total) by mouth every 8 (eight) hours as needed for muscle spasms. 11/30/20   Ghimire, Henreitta Leber, MD  Multiple Vitamin (MULTIVITAMIN) tablet Take  1 tablet by mouth daily.    [provider]  Nutritional Supplements (FEEDING SUPPLEMENT, NEPRO CARB STEADY,) LIQD Take 237 mLs by mouth 3 (three) times daily between meals. 11/30/20   Ghimire, Henreitta Leber, MD  ondansetron (ZOFRAN) 4 MG tablet Take 1 tablet (4 mg total) by mouth every 8 (eight) hours as needed for nausea or vomiting. 07/16/18   Aline August, MD  OXYGEN Inhale 2 L into the lungs as needed (shortness of breath).    [provider]  pantoprazole (PROTONIX) 40 MG tablet Take 1 tablet (40 mg total) by mouth daily at 6 (six) AM. 12/01/20   Ghimire, Henreitta Leber, MD  Propylene Glycol 0.6 % SOLN Place 1 drop into both eyes daily as needed (for dry eyes).    [provider]  TART CHERRY PO Take 1 tablet by mouth daily.  [provider]    Physical Exam: Vitals:   11/07/2020 1630 11/06/2020 1700 11/25/2020 1730 11/10/2020 1856  BP: (!) 99/50 (!) 97/50 (!) 100/51 (!) 97/57  Pulse: 63 64 65 69  Resp: (!) 21 (!) 21 20 17   Temp:    98 F (36.7 C)  TempSrc:    Oral  SpO2: 98% 97% 97% 98%  Weight:      Height:        Constitutional: NAD, calm, comfortable Vitals:   11/16/2020 1630 11/19/2020 1700 11/15/2020 1730 12/06/2020 1856  BP: (!) 99/50 (!) 97/50 (!) 100/51 (!) 97/57  Pulse: 63 64 65 69  Resp: (!) 21 (!) 21 20 17   Temp:    98 F (36.7 C)  TempSrc:    Oral  SpO2: 98% 97% 97% 98%  Weight:      Height:       Eyes: PERRL, lids and conjunctivae normal ENMT: Mucous membranes are moist. Posterior pharynx clear of any exudate or lesions.Normal dentition.  Neck: normal, supple, no masses, no thyromegaly Respiratory: clear to auscultation bilaterally, no wheezing, B/L crackles on lung bases. Increasing respiratory effort.  Talking in broken sentences, signs of accessory muscle use.  Cardiovascular: Regular rate and rhythm, no murmurs / rubs / gallops. 2+ extremity edema. 2+ pedal pulses. No carotid bruits.  Abdomen: no tenderness, no masses palpated. No  hepatosplenomegaly. Bowel sounds positive.  Musculoskeletal: no clubbing / cyanosis. No joint deformity upper and lower extremities. Good ROM, no contractures. Normal muscle tone.  Skin: no rashes, lesions, ulcers. No induration Neurologic: CN 2-12 grossly intact. Sensation intact, DTR normal. Strength 5/5 in all 4.  Psychiatric: Normal judgment and insight. Alert and oriented x 3. Normal mood.    Labs on Admission: I have personally reviewed following labs and imaging studies  CBC: Recent Labs  Lab 11/22/2020 1205  WBC 8.9  NEUTROABS 7.5  HGB 6.7*  HCT 22.5*  MCV 101.4*  PLT 169   Basic Metabolic Panel: Recent Labs  Lab 11/28/20 0024 11/29/20 0131 11/30/20 0120 12/05/2020 1205  NA 146* 148* 146* 145  K 4.0 4.0 4.1 5.5*  CL 116* 118* 117* 113*  CO2 21* 21* 21* 20*  GLUCOSE 121* 105* 118* 94  BUN 142* 134* 129* 165*  CREATININE 4.05* 4.00* 4.02* 6.32*  CALCIUM 8.9 9.0 9.1 8.7*  PHOS 4.7* 4.3 4.1  --    GFR: Estimated Creatinine Clearance: 8.1 mL/min (A) (by C-G formula based on SCr of 6.32 mg/dL (H)). Liver Function Tests: Recent Labs  Lab 11/28/20 0024 11/29/20 0131 11/30/20 0120  ALBUMIN 2.7* 2.7* 2.6*   No results for input(s): LIPASE, AMYLASE in the last 168 hours. No results for input(s): AMMONIA in the last 168 hours. Coagulation Profile: No results for input(s): INR, PROTIME in the last 168 hours. Cardiac Enzymes: No results for input(s): CKTOTAL, CKMB, CKMBINDEX, TROPONINI in the last 168 hours. BNP (last 3 results) No results for input(s): PROBNP in the last 8760 hours. HbA1C: No results for input(s): HGBA1C in the last 72 hours. CBG: Recent Labs  Lab 12/01/2020 1700  GLUCAP 83   Lipid Profile: No results for input(s): CHOL, HDL, LDLCALC, TRIG, CHOLHDL, LDLDIRECT in the last 72 hours. Thyroid Function Tests: No results for input(s): TSH, T4TOTAL, FREET4, T3FREE, THYROIDAB in the last 72 hours. Anemia Panel: No results for input(s): VITAMINB12,  FOLATE, FERRITIN, TIBC, IRON, RETICCTPCT in the last 72 hours. Urine analysis:    Component Value Date/Time   COLORURINE YELLOW 11/19/2020 1731  APPEARANCEUR HAZY (A) 11/19/2020 1731   LABSPEC 1.012 11/19/2020 1731   PHURINE 5.0 11/19/2020 1731   GLUCOSEU NEGATIVE 11/19/2020 1731   GLUCOSEU NEGATIVE 02/25/2019 1441   HGBUR SMALL (A) 11/19/2020 1731   BILIRUBINUR NEGATIVE 11/19/2020 1731   BILIRUBINUR Neg 05/22/2018 1505   KETONESUR NEGATIVE 11/19/2020 1731   PROTEINUR NEGATIVE 11/19/2020 1731   UROBILINOGEN 0.2 02/25/2019 1441   NITRITE NEGATIVE 11/19/2020 1731   LEUKOCYTESUR TRACE (A) 11/19/2020 1731    Radiological Exams on Admission: DG Chest 2 View  Result Date: 11/16/2020 CLINICAL DATA:  Shortness of breath EXAM: CHEST - 2 VIEW COMPARISON:  11/20/2020 FINDINGS: Check shadow is enlarged but stable. Postsurgical changes are again seen. Increase in vascular congestion is noted with small effusions bilaterally. Increased left retrocardiac density is noted likely representing atelectasis or early infiltrate. IMPRESSION: Vascular congestion with bilateral effusions and left basilar airspace opacity. Electronically Signed   By: Inez Catalina M.D.   On: 11/28/2020 13:32    EKG: Independently reviewed.  Sinus tented T waves on multiple leads..  Assessment/Plan Active Problems:   AKI (acute kidney injury) ([place])  (please populate well all problems here in Problem List. (For example, if patient is on BP meds at home and you resume or decide to hold them, it is a problem that needs to be her. Same for CAD, COPD, HLD and so on)  AKI on CKD stage IV with worsening of Uremia and Azotemia and new onset of hyperkalemia -Long discussion with patient at bedside and husband over this phone. Patient maintained that she does not want dialysis, I explained to her that if not starting HD, she will not survive. Patient expressed that she understand the grave consequence, and I asked her about change her  goal of care, and patient agreed with DNR. Explained to husband over the phone, who expressed understanding and agreed. -Consult Palliative care.  Discussed with on-call nephrology Dr. Carolin Sicks, who agreed with the plan.  Hyperkalemia -Received D50, insulin, Lokelma -Repeat BMP tonight and in AM. -Palliative care, if patient agreed with hospice, will stop further workup and treatment.  Severe uremia -As above.  Worsening of anemia secondary to CKD -Normal iron study last week -Getting PRBC x1.  Acute on chronic diastolic CHF decompensation -Blood pressure too low for diuresis, likely there is no significant increase of oxygen demand, will monitor tonight, if BP improves, will start diuresis tomorrow.  Hypotension -As above.  DVT prophylaxis: Heparin subcu Code Status: DNR Family Communication: Husband over the phone Disposition Plan: Expect more than 2 midnight hospital stay, expect patient discharged to hospice. Consults called: Palliative care, nephrology. Admission status: PCU   Lequita Halt MD [facility] Pager [phone]  11/22/2020, 6:59 PM

## 2020-12-04 NOTE — ED Provider Notes (Signed)
[facility] EMERGENCY DEPARTMENT Provider Note   CSN: [id number] Arrival date & time: 12/03/2020  1114     History No chief complaint on file.   [name] is a 75 y.o. female with PMHx CKD stage IV, chronic diastolic CHF with severe hypertrophic cardiomyopathy, chronic hypoxic respite failure with baseline 2 to 3 L all the time, severe AS status post biosynthetic aortic valve replacement, HTN, chronic cervical spine radiculopathy, chronic ambulation dysfunction secondary to chronic severe bilateral knee OA, Gout, frequent UTIs, morbid obesity who presents for evaluation of shortness of breath.  Patient is poor historian, which limits HPI.  Patient reports an approximately 5-day history of malaise, increased fatigue, decreased appetite, and worsening shortness of breath, which is present even at rest.  She denies any recent fever, cough, chest pain, abdominal pain, nausea, vomiting, diarrhea, or constipation.  Patient states that she has not noticed any melena or hematochezia.  No hematemesis or other sources of bleeding.  Of note, patient was recently admitted from 8/14 through 8/25 for AKI on CKD.  She was subsequently discharged back to SNF.     Past Medical History:  Diagnosis Date   Adenomatous colon polyp 01/2002   Anemia, chronic disease    /notes 02/04/2017   Aortic valve disease    AI/AS   Arthritis    "lower back, knees" (02/04/2017)   CHF (congestive heart failure) (HCC)    CKD (chronic kidney disease), stage III (HCC)    Diverticulosis of colon    DJD (degenerative joint disease)    Frequent UTI    Hemorrhoids    Hypertension    Lumbar back pain    Neuropathy    Obesity    Pericardial effusion     in a patient with Diastolic heart  failure and Pericardial effusion  known since last 2 D echo in 11/2016 /notes 02/04/2017   PONV (postoperative nausea and vomiting)    Pulmonary HTN (HCC)    Renal cyst    Venous insufficiency    Vitamin D deficiency      Patient Active Problem List   Diagnosis Date Noted   Fecal occult blood test positive    Pressure injury of skin 11/20/2020   Pain    Nonrheumatic mitral valve regurgitation 03/16/2020   Acute respiratory disease due to COVID-19 virus 06/05/2019   Degenerative arthritis of knee, bilateral 04/06/2019   Encounter for general adult medical examination with abnormal findings 02/26/2019   Degenerative disc disease, cervical 12/12/2018   Chronic kidney disease 08/10/2018   Leg swelling 11/24/2017   Bilateral leg weakness 06/26/2017   History of aortic valve replacement with bioprosthetic valve 05/30/2017   Ascending aortic dissection (HCC)    Chronic diastolic CHF (congestive heart failure) ([place]) 04/19/2017   Nonrheumatic aortic valve insufficiency 03/10/2017   Pulmonary HTN ([place]) 03/10/2017   Neck pain 02/11/2017   AKI (acute kidney injury) ([place])    Pericardial effusion 02/04/2017   Abnormal bone marrow examination 01/24/2017   Stenosis of right carotid artery 08/27/2016   LBBB (left bundle branch block) 08/27/2016   Left-sided tinnitus 08/16/2016   Anemia in chronic kidney disease 11/02/2015   OA (osteoarthritis) of knee 03/23/2012   Vitamin D deficiency 04/13/2008   Symptomatic anemia 04/13/2008   OBESITY 02/23/2008   Radiculopathy 02/23/2008   Essential hypertension 02/23/2008   Venous (peripheral) insufficiency 02/23/2008    Past Surgical History:  Procedure Laterality Date   ABDOMINAL HYSTERECTOMY     "partial"   AORTIC  VALVE REPLACEMENT N/A 05/30/2017   Procedure: AORTIC VALVE REPLACEMENT (AVR);  Surgeon: Prescott Gum, Collier Salina, MD;  Location: [facility] OR;  Service: Vascular;  Laterality: N/A;   COLONOSCOPY W/ BIOPSIES AND POLYPECTOMY     "bx was ok"   IR THORACENTESIS ASP PLEURAL SPACE W/IMG GUIDE  06/11/2017   IR THORACENTESIS ASP PLEURAL SPACE W/IMG GUIDE  06/12/2017   REPAIR OF ACUTE ASCENDING THORACIC AORTIC DISSECTION N/A 05/30/2017   Procedure: REPLACEMENT OF ASCENDING  AORTIC ANEURYSM AND REPAIRED CHRONIC ROOT DISSECTION WITH CIRC ARREST;  Surgeon: Ivin Poot, MD;  Location: [facility];  Service: Vascular;  Laterality: N/A;   TEE WITHOUT CARDIOVERSION N/A 05/28/2017   Procedure: TRANSESOPHAGEAL ECHOCARDIOGRAM (TEE);  Surgeon: Skeet Latch, MD;  Location: [facility];  Service: Cardiovascular;  Laterality: N/A;   TEE WITHOUT CARDIOVERSION N/A 05/30/2017   Procedure: TRANSESOPHAGEAL ECHOCARDIOGRAM (TEE);  Surgeon: Prescott Gum, Collier Salina, MD;  Location: [facility];  Service: Open Heart Surgery;  Laterality: N/A;     OB History   No obstetric history on file.     Family History  Problem Relation Age of Onset   Diabetes Mother    Colon polyps Mother    Hypertension Mother    Hypertension Brother    Hypertension Sister    Colon cancer Neg Hx     Social History   Tobacco Use   Smoking status: Former    Packs/day: 0.40    Years: 15.00    Pack years: 6.00    Types: Cigarettes    Quit date: 04/08/1982    Years since quitting: 38.6   Smokeless tobacco: Never  Vaping Use   Vaping Use: Never used  Substance Use Topics   Alcohol use: No   Drug use: No    Home Medications Prior to Admission medications   Medication Sig Start Date End Date Taking? Authorizing Provider  amLODipine (NORVASC) 5 MG tablet Take 5 mg by mouth at bedtime. 05/07/18   [provider]  aspirin EC 81 MG tablet Take 81 mg by mouth daily.    [provider]  calcitRIOL (ROCALTROL) 0.25 MCG capsule Take 0.25 mcg by mouth See admin instructions. Take one capsule (0.25 mcg) by mouth twice weekly - Monday and Friday    [provider]  CALCIUM CARBONATE ANTACID PO Take 2 tablets by mouth daily as needed for indigestion or heartburn.     [provider]  capsicum (ZOSTRIX) 0.075 % topical cream Apply topically 2 (two) times daily. Apply to B/L knees AVOID contact with eyes and broken or irritated skin. 11/30/20   Ghimire, Henreitta Leber, MD  carvedilol (COREG) 25  MG tablet Take 1 tablet (25 mg total) by mouth 2 (two) times daily with a meal. 07/16/18   Aline August, MD  Colchicine 0.6 MG CAPS Take 0.6 mg by mouth as needed. Take every other day as needed during gout flare; not to exceed 3 doses per flare 03/22/20   [provider]  cyanocobalamin 1000 MCG tablet Take 1,000 mcg by mouth daily.    [provider]  DULoxetine (CYMBALTA) 30 MG capsule Take 1 capsule (30 mg total) by mouth daily. 12/01/20   Ghimire, Henreitta Leber, MD  fluticasone (FLONASE) 50 MCG/ACT nasal spray Place 2 sprays into both nostrils daily as needed for allergies. 07/16/18   Aline August, MD  gabapentin (NEURONTIN) 100 MG capsule Take 1 capsule (100 mg total) by mouth at bedtime. 02/04/20   Lyndal Pulley, DO  methocarbamol (ROBAXIN) 500 MG  tablet Take 1 tablet (500 mg total) by mouth every 8 (eight) hours as needed for muscle spasms. 11/30/20   Ghimire, Henreitta Leber, MD  Multiple Vitamin (MULTIVITAMIN) tablet Take 1 tablet by mouth daily.    [provider]  Nutritional Supplements (FEEDING SUPPLEMENT, NEPRO CARB STEADY,) LIQD Take 237 mLs by mouth 3 (three) times daily between meals. 11/30/20   Ghimire, Henreitta Leber, MD  ondansetron (ZOFRAN) 4 MG tablet Take 1 tablet (4 mg total) by mouth every 8 (eight) hours as needed for nausea or vomiting. 07/16/18   Aline August, MD  OXYGEN Inhale 2 L into the lungs as needed (shortness of breath).    [provider]  pantoprazole (PROTONIX) 40 MG tablet Take 1 tablet (40 mg total) by mouth daily at 6 (six) AM. 12/01/20   Ghimire, Henreitta Leber, MD  Propylene Glycol 0.6 % SOLN Place 1 drop into both eyes daily as needed (for dry eyes).    [provider]  TART CHERRY PO Take 1 tablet by mouth daily.    [provider]    Allergies    Methylprednisolone, Minoxidil, and Naproxen  Review of Systems   Review of Systems  Constitutional:  Positive for activity change, appetite change and fatigue. Negative for  fever.  Respiratory:  Positive for shortness of breath. Negative for cough.   Cardiovascular:  Negative for chest pain.  Gastrointestinal:  Negative for abdominal pain, anal bleeding, blood in stool, constipation, diarrhea, nausea and vomiting.  Genitourinary:  Negative for dysuria and frequency.  Musculoskeletal:  Positive for myalgias.  Neurological:  Negative for syncope, weakness and numbness.  Hematological:  Does not bruise/bleed easily.   Physical Exam Updated Vital Signs BP (!) 98/47 (BP Location: Left Arm)   Pulse 64   Temp (!) 97.3 F (36.3 C) (Oral)   Resp (!) 23   Ht 5\' 6"  (1.676 m)   Wt 77.1 kg   SpO2 97%   BMI 27.44 kg/m   Physical Exam Vitals and nursing note reviewed.  Constitutional:      General: She is not in acute distress.    Appearance: Normal appearance. She is well-developed. She is obese. She is ill-appearing.  HENT:     Head: Normocephalic and atraumatic.  Eyes:     Conjunctiva/sclera: Conjunctivae normal.  Cardiovascular:     Rate and Rhythm: Normal rate and regular rhythm.     Heart sounds: No murmur heard. Pulmonary:     Effort: Pulmonary effort is normal. No respiratory distress.     Breath sounds: Normal breath sounds.  Abdominal:     Palpations: Abdomen is soft.     Tenderness: There is no abdominal tenderness.  Musculoskeletal:     Cervical back: Neck supple.  Skin:    General: Skin is warm and dry.     Capillary Refill: Capillary refill takes less than 2 seconds.  Neurological:     General: No focal deficit present.     Mental Status: She is alert. Mental status is at baseline.    ED Results / Procedures / Treatments   Labs (all labs ordered are listed, but only abnormal results are displayed) Labs Reviewed  BASIC METABOLIC PANEL - Abnormal; Notable for the following components:      Result Value   Potassium 5.5 (*)    Chloride 113 (*)    CO2 20 (*)    BUN 165 (*)    Creatinine, Ser 6.32 (*)    Calcium 8.7 (*)  GFR,  Estimated 6 (*)    All other components within normal limits  CBC WITH DIFFERENTIAL/PLATELET - Abnormal; Notable for the following components:   RBC 2.22 (*)    Hemoglobin 6.7 (*)    HCT 22.5 (*)    MCV 101.4 (*)    MCHC 29.8 (*)    RDW 18.2 (*)    Abs Immature Granulocytes 0.11 (*)    All other components within normal limits  BRAIN NATRIURETIC PEPTIDE - Abnormal; Notable for the following components:   B Natriuretic Peptide 399.9 (*)    All other components within normal limits    EKG EKG Interpretation  Date/Time:  Monday December 04 2020 19:07:55 EDT Ventricular Rate:  73 PR Interval:  174 QRS Duration: 173 QT Interval:  493 QTC Calculation: 544 R Axis:   -68 Text Interpretation: Sinus rhythm Left bundle branch block No significant change since last tracing Confirmed by Dorie Rank [phone]) on 12/05/2020 2:22:57 PM  Radiology DG Chest 2 View  Result Date: 11/29/2020 CLINICAL DATA:  Shortness of breath EXAM: CHEST - 2 VIEW COMPARISON:  11/20/2020 FINDINGS: Check shadow is enlarged but stable. Postsurgical changes are again seen. Increase in vascular congestion is noted with small effusions bilaterally. Increased left retrocardiac density is noted likely representing atelectasis or early infiltrate. IMPRESSION: Vascular congestion with bilateral effusions and left basilar airspace opacity. Electronically Signed   By: Inez Catalina M.D.   On: 11/12/2020 13:32    Procedures Procedures   Medications Ordered in ED Medications  aspirin EC tablet 81 mg (81 mg Oral Given 12/05/20 1132)  DULoxetine (CYMBALTA) DR capsule 30 mg (30 mg Oral Given 12/05/20 1132)  calcitRIOL (ROCALTROL) capsule 0.25 mcg (has no administration in time range)  pantoprazole (PROTONIX) EC tablet 40 mg (40 mg Oral Given 12/05/20 0621)  vitamin B-12 (CYANOCOBALAMIN) tablet 1,000 mcg (1,000 mcg Oral Given 12/05/20 1131)  gabapentin (NEURONTIN) capsule 100 mg (100 mg Oral Given 11/10/2020 2209)  methocarbamol (ROBAXIN)  tablet 500 mg (500 mg Oral Given 12/05/20 1131)  feeding supplement (NEPRO CARB STEADY) liquid 237 mL (237 mLs Oral New Bag/Given 12/05/20 1309)  fluticasone (FLONASE) 50 MCG/ACT nasal spray 2 spray (has no administration in time range)  polyvinyl alcohol (LIQUIFILM TEARS) 1.4 % ophthalmic solution 1 drop (has no administration in time range)  heparin injection 5,000 Units (5,000 Units Subcutaneous Given 12/05/20 1132)  ondansetron (ZOFRAN) tablet 4 mg (has no administration in time range)    Or  ondansetron (ZOFRAN) injection 4 mg (has no administration in time range)  sodium zirconium cyclosilicate (LOKELMA) packet 10 g (10 g Oral Given 12/05/20 1132)  collagenase (SANTYL) ointment ( Topical Given 12/05/20 1308)  MEDLINE mouth rinse (15 mLs Mouth Rinse Given 12/05/20 1220)  acetaminophen (TYLENOL) tablet 650 mg (650 mg Oral Given 12/05/20 1219)  sodium bicarbonate tablet 650 mg (has no administration in time range)  insulin aspart (novoLOG) injection 5 Units (5 Units Intravenous Given 11/10/2020 1748)    And  dextrose 50 % solution 50 mL (50 mLs Intravenous Given 11/26/2020 1750)  calcium gluconate inj 10% (1 g) URGENT USE ONLY! (1 g Intravenous Given 12/03/2020 1753)  sodium zirconium cyclosilicate (LOKELMA) packet 10 g (10 g Oral Given 11/28/2020 1755)  0.9 %  sodium chloride infusion (0 mL/hr Intravenous Stopped 11/23/2020 2219)  pantoprazole (PROTONIX) 80 mg /NS 100 mL IVPB (0 mg Intravenous Stopped 11/22/2020 2332)  albumin human 25 % solution 25 g (0 g Intravenous Stopped 12/05/20 0849)  calcium gluconate inj 10% (  1 g) URGENT USE ONLY! (1 g Intravenous Given 12/05/20 1705)    ED Course  I have reviewed the triage vital signs and the nursing notes.  Pertinent labs & imaging results that were available during my care of the patient were reviewed by me and considered in my medical decision making (see chart for details).  Clinical Course as of 12/05/20 1848  Mon Dec 04, 2020  1928 AKI on CKD with associated  hyperkalemia, EKG changes requiring insulin/dextrose, calcium gluconate, and Lokelma.  Likely prerenal in etiology.  Critical anemia with hemoglobin 6.7, for which she was transfused 1 unit PRBCs.  Admit to hospitalist. [CH]    Clinical Course User Index [CH] Violet Baldy, MD   MDM Rules/Calculators/A&P                           75 y.o. female with past medical history as above who presents for evaluation of malaise, fatigue, decreased appetite, and worsening shortness of breath.  Patient is afebrile and hemodynamically stable here, with soft blood pressures.  Labs notable for critical anemia with hemoglobin 6.7, for which she was transfused 1 unit PRBCs.  Patient denies any history of bleeding including melena or hematochezia.  BMP demonstrates AKI on CKD with creatinine 6.3 from baseline for as well as associated hyperkalemia with potassium 5.5.  EKG with peaked T waves, for which the patient was given insulin/dextrose, calcium gluconate, and Lokelma.  Patient presentation consistent with AKI on CKD with associated hyperkalemia, as well as critical anemia.  Patient was admitted to the hospitalist for further care.   Final Clinical Impression(s) / ED Diagnoses Final diagnoses:  AKI (acute kidney injury) ([place])  Anemia, unspecified type    Rx / DC Orders ED Discharge Orders     None        Violet Baldy, MD 12/05/20 Paulette Blanch    Elnora Morrison, MD 12/06/20 0010

## 2020-12-04 NOTE — ED Triage Notes (Addendum)
Patient arrived from [facility] with SOB and EMS reports altered mental status. Patient at baseline per staff. Arrived on oxygen at 3l. On further assessment patient complains of bilateral chronic knee pain, denies any injury

## 2020-12-05 ENCOUNTER — Encounter (HOSPITAL_COMMUNITY): Payer: Self-pay | Admitting: Internal Medicine

## 2020-12-05 ENCOUNTER — Inpatient Hospital Stay: Payer: Self-pay

## 2020-12-05 DIAGNOSIS — L89300 Pressure ulcer of unspecified buttock, unstageable: Secondary | ICD-10-CM

## 2020-12-05 DIAGNOSIS — I5033 Acute on chronic diastolic (congestive) heart failure: Secondary | ICD-10-CM

## 2020-12-05 DIAGNOSIS — Z7189 Other specified counseling: Secondary | ICD-10-CM

## 2020-12-05 DIAGNOSIS — N179 Acute kidney failure, unspecified: Secondary | ICD-10-CM | POA: Diagnosis not present

## 2020-12-05 LAB — TYPE AND SCREEN
ABO/RH(D): A POS
Antibody Screen: NEGATIVE
Unit division: 0

## 2020-12-05 LAB — BASIC METABOLIC PANEL
Anion gap: 13 (ref 5–15)
Anion gap: 14 (ref 5–15)
BUN: 177 mg/dL — ABNORMAL HIGH (ref 8–23)
BUN: 177 mg/dL — ABNORMAL HIGH (ref 8–23)
CO2: 18 mmol/L — ABNORMAL LOW (ref 22–32)
CO2: 16 mmol/L — ABNORMAL LOW (ref 22–32)
Calcium: 8.9 mg/dL (ref 8.9–10.3)
Calcium: 8.7 mg/dL — ABNORMAL LOW (ref 8.9–10.3)
Chloride: 115 mmol/L — ABNORMAL HIGH (ref 98–111)
Chloride: 115 mmol/L — ABNORMAL HIGH (ref 98–111)
Creatinine, Ser: 6.68 mg/dL — ABNORMAL HIGH (ref 0.44–1.00)
Creatinine, Ser: 6.87 mg/dL — ABNORMAL HIGH (ref 0.44–1.00)
GFR, Estimated: 6 mL/min — ABNORMAL LOW (ref >60)
GFR, Estimated: 6 mL/min — ABNORMAL LOW (ref >60)
Glucose, Bld: 84 mg/dL (ref 70–99)
Glucose, Bld: 106 mg/dL — ABNORMAL HIGH (ref 70–99)
Potassium: 5.7 mmol/L — ABNORMAL HIGH (ref 3.5–5.1)
Potassium: 5.9 mmol/L — ABNORMAL HIGH (ref 3.5–5.1)
Sodium: 146 mmol/L — ABNORMAL HIGH (ref 135–145)
Sodium: 145 mmol/L (ref 135–145)

## 2020-12-05 LAB — CBC
HCT: 25.2 % — ABNORMAL LOW (ref 36.0–46.0)
Hemoglobin: 7.2 g/dL — ABNORMAL LOW (ref 12.0–15.0)
MCH: 28.9 pg (ref 26.0–34.0)
MCHC: 28.6 g/dL — ABNORMAL LOW (ref 30.0–36.0)
MCV: 101.2 fL — ABNORMAL HIGH (ref 80.0–100.0)
Platelets: 154 10*3/uL (ref 150–400)
RBC: 2.49 MIL/uL — ABNORMAL LOW (ref 3.87–5.11)
RDW: 19.1 % — ABNORMAL HIGH (ref 11.5–15.5)
WBC: 8.6 10*3/uL (ref 4.0–10.5)
nRBC: 0.2 % (ref 0.0–0.2)

## 2020-12-05 LAB — BPAM RBC
Blood Product Expiration Date: 202209222359
ISSUE DATE / TIME: 202208291847
Unit Type and Rh: 6200

## 2020-12-05 LAB — MRSA NEXT GEN BY PCR, NASAL: MRSA by PCR Next Gen: NOT DETECTED

## 2020-12-05 MED ORDER — CALCIUM GLUCONATE 10 % IV SOLN
1.0000 g | Freq: Once | INTRAVENOUS | Status: AC
  Administered 2020-12-05: 1 g via INTRAVENOUS
  Filled 2020-12-05: qty 10

## 2020-12-05 MED ORDER — SODIUM ZIRCONIUM CYCLOSILICATE 10 G PO PACK
10.0000 g | PACK | Freq: Two times a day (BID) | ORAL | Status: DC
  Administered 2020-12-05 – 2020-12-06 (×4): 10 g via ORAL
  Filled 2020-12-05 (×5): qty 1

## 2020-12-05 MED ORDER — COLLAGENASE 250 UNIT/GM EX OINT
TOPICAL_OINTMENT | Freq: Every day | CUTANEOUS | Status: DC
  Filled 2020-12-05 (×3): qty 30

## 2020-12-05 MED ORDER — SODIUM BICARBONATE 650 MG PO TABS
650.0000 mg | ORAL_TABLET | Freq: Two times a day (BID) | ORAL | Status: DC
  Administered 2020-12-05 – 2020-12-06 (×2): 650 mg via ORAL
  Filled 2020-12-05 (×2): qty 1

## 2020-12-05 MED ORDER — ORAL CARE MOUTH RINSE
15.0000 mL | Freq: Two times a day (BID) | OROMUCOSAL | Status: DC
  Administered 2020-12-05 – 2020-12-07 (×6): 15 mL via OROMUCOSAL

## 2020-12-05 MED ORDER — ALBUMIN HUMAN 25 % IV SOLN
25.0000 g | Freq: Once | INTRAVENOUS | Status: AC
  Administered 2020-12-05: 25 g via INTRAVENOUS
  Filled 2020-12-05: qty 100

## 2020-12-05 MED ORDER — ACETAMINOPHEN 325 MG PO TABS
650.0000 mg | ORAL_TABLET | Freq: Four times a day (QID) | ORAL | Status: DC | PRN
  Administered 2020-12-05: 650 mg via ORAL

## 2020-12-05 NOTE — Evaluation (Signed)
Physical Therapy Evaluation Patient Details Name: [name] MRN: [id number] DOB: 1945-05-03 Today's Date: 12/05/2020   History of Present Illness  75 y.o. female  admitted on 11/17/2020 with increasing shortness of breath, confusion, poor oral intake and nausea PMH: CKD stage IV, chronic diastolic CHF severe hypertrophic cardiomyopathy hypoxia baseline 2 to 3 L, severe AS s/p biosynthetic aortic valve replacement, severe bilateral knee OA, chronic anemia secondary to CKD, gout   Clinical Impression  Pt presenting with very poor strength and activity tolerance. Pt with low BP 93/40 however doesn't report dizziness when sitting up at EOB. Pt totalA for all mobility and ADLs. Unsure how much pt was getting up at [facility]. Pt remains appropriate for SNF upon d/c once medically stable.    Follow Up Recommendations SNF;Supervision/Assistance - 24 hour    Equipment Recommendations  None recommended by PT    Recommendations for Other Services       Precautions / Restrictions Precautions Precautions: Fall Restrictions Weight Bearing Restrictions: No      Mobility  Bed Mobility Overal bed mobility: Needs Assistance Bed Mobility: Rolling;Sidelying to Sit;Sit to Sidelying Rolling: Total assist Sidelying to sit: Total assist Supine to sit: Total assist     General bed mobility comments: HOB elevated, total assist however for laborous, would recommend a second person, pt with no active participation, total assist to maintain EOB balance    Transfers                 General transfer comment: unable to attempt safely, will need hoyer lift  Ambulation/Gait             General Gait Details: unable  Stairs            Wheelchair Mobility    Modified Rankin (Stroke Patients Only)       Balance Overall balance assessment: History of Falls;Needs assistance Sitting-balance support: Bilateral upper extremity supported Sitting balance-Leahy Scale: Zero Sitting balance  - Comments: dependent on physical assist posteriorly                                     Pertinent Vitals/Pain Pain Assessment: Faces Faces Pain Scale: Hurts even more Pain Location: Bilat knees (with LE ROM) Pain Descriptors / Indicators: Grimacing Pain Intervention(s): Limited activity within patient's tolerance    Home Living Family/patient expects to be discharged to:: Skilled nursing facility                 Additional Comments: was at [facility], recent hospital stay 8/14 and d/c'd to [facility]    Prior Function Level of [place]: Needs assistance   Gait / Transfers Assistance Needed: uses w/c, pt reports not getting out of bed since she's been there  ADL's / Homemaking Assistance Needed: dependent for all ADLs, was feeding self until recently        Hand Dominance   Dominant Hand: Right    Extremity/Trunk Assessment   Upper Extremity Assessment Upper Extremity Assessment:  (bilat UEs with minimal active movement, very stiff, grimacing with ROM at shoulders, elbow, wrist and hands)    Lower Extremity Assessment Lower Extremity Assessment: Generalized weakness RLE Deficits / Details: Strength testing deferred secondary to pain. Pt unable to demonstrate any functional ROM actively and reported pain with any passive movement by PT. Edema of ankle, skin very dry, toenails very long. Pt reports neither she nor her husband are able to care for  her feet. LLE Deficits / Details: Strength testing deferred secondary to pain. Pt unable to demonstrate any functional ROM actively and reported pain with any passive movement by PT. Edema of ankle, skin very dry, toenails very long. Left ankle warmer than right to touch.    Cervical / Trunk Assessment Cervical / Trunk Assessment: Kyphotic (head in R side bending)  Communication   Communication:  (soft spoken)  Cognition Arousal/Alertness: Awake/alert Behavior During Therapy: WFL for tasks assessed/performed Overall  Cognitive Status: No family/caregiver present to determine baseline cognitive functioning                                 General Comments: unsure of accuracy of PLOF, soft spoken, limited effort but did answer all questions and attempt to follow commands      General Comments General comments (skin integrity, edema, etc.): BP 93/40    Exercises Other Exercises Other Exercises: attempted bilat LE passive ROM however pt with bilat knee pain'   Assessment/Plan    PT Assessment Patient needs continued PT services  PT Problem List Decreased strength;Decreased range of motion;Decreased activity tolerance;Decreased balance;Decreased mobility;Decreased coordination;Decreased knowledge of use of DME;Pain;Decreased safety awareness;Decreased cognition       PT Treatment Interventions DME instruction;Gait training;Functional mobility training;Therapeutic activities;Therapeutic exercise;Balance training;Neuromuscular re-education;Patient/family education;Wheelchair mobility training    PT Goals (Current goals can be found in the Care Plan section)  Acute Rehab PT Goals Patient Stated Goal: not stated PT Goal Formulation: With patient Time For Goal Achievement: 12/19/20 Potential to Achieve Goals: Fair    Frequency Min 2X/week   Barriers to discharge        Co-evaluation               AM-PAC PT "6 Clicks" Mobility  Outcome Measure Help needed turning from your back to your side while in a flat bed without using bedrails?: Total Help needed moving from lying on your back to sitting on the side of a flat bed without using bedrails?: Total Help needed moving to and from a bed to a chair (including a wheelchair)?: Total Help needed standing up from a chair using your arms (e.g., wheelchair or bedside chair)?: Total Help needed to walk in hospital room?: Total Help needed climbing 3-5 steps with a railing? : Total 6 Click Score: 6    End of Session Equipment  Utilized During Treatment: Oxygen Activity Tolerance: Patient limited by pain Patient left: in bed;with call bell/phone within reach;with bed alarm set Nurse Communication: Mobility status PT Visit Diagnosis: Muscle weakness (generalized) (M62.81);History of falling (Z91.81);Pain;Other abnormalities of gait and mobility (R26.89) Pain - part of body: Knee;Leg    Time: 1400-1420 PT Time Calculation (min) (ACUTE ONLY): 20 min   Charges:   PT Evaluation $PT Eval Moderate Complexity: 1 Mod          Kittie Plater, PT, DPT Acute Rehabilitation Services Pager #: [phone] Office #: [phone]   Berline Lopes 12/05/2020, 2:56 PM

## 2020-12-05 NOTE — Progress Notes (Signed)
Dr. Karleen Hampshire made aware about patient's B/P of 99/36 and made aware that IV team has recommended a central line due very limited vein access.

## 2020-12-05 NOTE — Progress Notes (Signed)
Dear Doctor: This patient has been identified as a candidate for PICC for the following reason (s): very poor vasculature, assessed with [place] BLUE. If you agree, please write an order for the indicated device.   Thank you for supporting the early vascular access assessment program.

## 2020-12-05 NOTE — Consult Note (Addendum)
WOC Nurse Consult Note: Consult requested for buttocks.  Pt is familiar to [facility] nurse from recent admission on 8/15.  Skin red and macerated with patchy areas of partial thickness skin loss related to moisture associated skin damage to bilat buttocks and inner gluteal fold..  Wound type: Left upper buttock with healing Stage 3 pressure injury; 2X1X.1cm, 100% red and moist, small amt yellow drainage Right upper buttock with Unstageable pressure injury; 2X6cm, 100% yellow slough, small amt yellow drainage Right lower buttock with healing Stage 3 pressure injury; 2X1.5X.1cm, red and moist, small amt yellow drainage Dressing procedure/placement/frequency: Topical treatment orders provided for bedside nurses to perform to provide enzymatic debridement and promote healing: 1. Apply Santyl to right upper buttock wound Q day, then cover with moist gauze and foam dressing.  (Change foam dressing Q 3 days or PRN soiling.) 2. Foam dressings to left buttock and right lower buttock, change Q 3 days or PRN soiling. Please re-consult if further assistance is needed.  Thank-you,  Julien Girt MSN, [place], [place], [place], [place]

## 2020-12-05 NOTE — ED Notes (Signed)
Messaged provider about pt BP.

## 2020-12-05 NOTE — ED Notes (Signed)
MD stated he wants to be notified if pt MAP drops below 60. To keep monitoring pt.

## 2020-12-05 NOTE — Progress Notes (Signed)
Upon assessment of patient's vasculature. Patient as small, branching veins, or deep. Notified nurse, Aldona Bar RN that PIV was placed however, notified MD that patient will need PICC/CVC when this line no longer works VU. Fran Lowes, RN VAST

## 2020-12-05 NOTE — Consult Note (Signed)
Consultation Note Date: 12/05/2020   Patient Name: [name]  DOB: Aug 31, 1945  MRN: [id number]  Age / Sex: 75 y.o., female  PCP: Hoyt Koch, MD Referring Physician: Lequita Halt, MD  Reason for Consultation: Establishing goals of care  HPI/Patient Profile: 75 y.o. female  with past medical history of CKD stage IV, chronic diastolic CHF severe hypertrophic cardiomyopathy hypoxia baseline 2 to 3 L, severe AS s/p biosynthetic aortic valve replacement, severe bilateral knee OA, chronic anemia secondary to CKD, gout admitted on 11/25/2020 with increasing shortness of breath, confusion, poor oral intake and nausea.  Patient was hospitalized for similar presentation on 8/14 and discharged to [facility]. She has declined HD. Palliative medicine has been consulted to assist with goals of care conversation.   Clinical Assessment and Goals of Care:  I have reviewed medical records including EPIC notes, labs and imaging, received report from RN, assessed the patient and called her husband Juanda Crumble to discuss diagnosis prognosis, [name], EOL wishes, disposition and options.  I introduced Palliative Medicine as specialized medical care for people living with serious illness. It focuses on providing relief from the symptoms and stress of a serious illness. The goal is to improve quality of life for both the patient and the family.  We discussed a brief life review of the patient and then focused on their current illness. The natural disease trajectory and expectations at EOL were discussed. She is supported by her husband and shares that her son sadly passed away around 20 years ago. [name] is wheelchair-bound and no longer able to do the things she used to do. She is very tired and wants to be as peaceful as possible. She then states "but I'm not ready to be unhooked." She clarified that she would like to continue with  current medical management of her serious illness. She would never want dialysis but she is not ready to commit to comfort care at this time.    Patient's spouse Juanda Crumble shares that he has been having some discussions with [name] and he understands that she does not want to suffer. He recognizes that she took their son's death very hard and that the loss is likely impacting her current feelings and decision-making. He confirms that she wound never want dialysis. We discussed the potential for both causing harm and the benefits, as well as the importance of honoring her wishes. Educated on EOL trajectory in progressive renal failure. Juanda Crumble tells me that he is taking things one day at a time, as "every time I see her is different." She is often too tired to speak with him at length regarding advance care planning.    I attempted to elicit values and goals of care important to the patient.    The difference between aggressive medical intervention and comfort care was considered in light of the patient's goals of care.   Hospice services outpatient were explained and offered.  Discussed the importance of continued conversation with family and the medical providers regarding overall plan of care and  treatment options, ensuring decisions are within the context of the patient's values and GOCs.    Questions and concerns were addressed. The family was encouraged to call with questions or concerns.  PMT will continue to support holistically.    Next of kin is patient's spouse Juanda Crumble. No HCPOA on file.   SUMMARY OF RECOMMENDATIONS   -DNR confirmed -Continue continue interventions for now; patient would never want HD but is also not ready to transition to comfort care/hospice -Patient's spouse Juanda Crumble would like to continue watchful waiting. He is agreeable to ongoing Newborn discussions  Code Status/Advance Care Planning: DNR  Palliative Prophylaxis:  Bowel Regimen, Delirium Protocol, and Turn  Reposition  Additional Recommendations (Limitations, Scope, Preferences): No Hemodialysis  Psycho-social/Spiritual:  Desire for further Chaplaincy support:tbd Additional Recommendations: Education on Hospice  Prognosis:  Poor prognosis given progressive renal dysfunction, multiple comorbidities, frailty, functional decline  Discharge Planning: To Be Determined      Primary Diagnoses: Present on Admission:  AKI (acute kidney injury) ([place])   I have reviewed the medical record, interviewed the patient and family, and examined the patient. The following aspects are pertinent.  Past Medical History:  Diagnosis Date   Adenomatous colon polyp 01/2002   Anemia, chronic disease    /notes 02/04/2017   Aortic valve disease    AI/AS   Arthritis    "lower back, knees" (02/04/2017)   CHF (congestive heart failure) (HCC)    CKD (chronic kidney disease), stage III (HCC)    Diverticulosis of colon    DJD (degenerative joint disease)    Frequent UTI    Hemorrhoids    Hypertension    Lumbar back pain    Neuropathy    Obesity    Pericardial effusion     in a patient with Diastolic heart  failure and Pericardial effusion  known since last 2 D echo in 11/2016 /notes 02/04/2017   PONV (postoperative nausea and vomiting)    Pulmonary HTN (HCC)    Renal cyst    Venous insufficiency    Vitamin D deficiency    Social History   Socioeconomic History   Marital status: Married    Spouse name: Juanda Crumble   Number of children: 1   Years of education: 12   Highest education level: Not on file  Occupational History   Occupation: retired from Administrator, arts  Tobacco Use   Smoking status: Former    Packs/day: 0.40    Years: 15.00    Pack years: 6.00    Types: Cigarettes    Quit date: 04/08/1982    Years since quitting: 38.6   Smokeless tobacco: Never  Vaping Use   Vaping Use: Never used  Substance and Sexual Activity   Alcohol use: No   Drug use: No   Sexual activity: Never  Other  Topics Concern   Not on file  Social History Narrative   Lives at home w/ her husband   Right-handed   Daily caffeine    Social Determinants of Health   Financial Resource Strain: Low Risk    Difficulty of Paying Living Expenses: Not hard at all  Food Insecurity: No Food Insecurity   Worried About Charity fundraiser in the Last Year: Never true   Ran Out of Food in the Last Year: Never true  Transportation Needs: No Transportation Needs   Lack of Transportation (Medical): No   Lack of Transportation (Non-Medical): No  Physical Activity: Inactive   Days of Exercise per Week: 0 days  Minutes of Exercise per Session: 0 min  Stress: No Stress Concern Present   Feeling of Stress : Not at all  Social Connections: Not on file   Family History  Problem Relation Age of Onset   Diabetes Mother    Colon polyps Mother    Hypertension Mother    Hypertension Brother    Hypertension Sister    Colon cancer Neg Hx    Scheduled Meds:  aspirin EC  81 mg Oral Daily   [START ON 12/12/2020] calcitRIOL  0.25 mcg Oral Once per day on Mon Fri   collagenase   Topical Daily   DULoxetine  30 mg Oral Daily   feeding supplement (NEPRO CARB STEADY)  237 mL Oral TID BM   gabapentin  100 mg Oral QHS   heparin  5,000 Units Subcutaneous Q12H   mouth rinse  15 mL Mouth Rinse BID   pantoprazole  40 mg Oral Q0600   sodium zirconium cyclosilicate  10 g Oral BID   cyanocobalamin  1,000 mcg Oral Daily   Continuous Infusions: PRN Meds:.acetaminophen, fluticasone, methocarbamol, ondansetron **OR** ondansetron (ZOFRAN) IV, polyvinyl alcohol Medications Prior to Admission:  Prior to Admission medications   Medication Sig Start Date End Date Taking? Authorizing Provider  amLODipine (NORVASC) 5 MG tablet Take 5 mg by mouth at bedtime. 05/07/18  Yes [provider]  aspirin EC 81 MG tablet Take 81 mg by mouth daily.   Yes [provider]  calcitRIOL (ROCALTROL) 0.25 MCG capsule Take 0.25 mcg by  mouth See admin instructions. Take one capsule (0.25 mcg) by mouth twice weekly - Monday and Friday   Yes [provider]  carvedilol (COREG) 25 MG tablet Take 1 tablet (25 mg total) by mouth 2 (two) times daily with a meal. 07/16/18  Yes Aline August, MD  cyanocobalamin 1000 MCG tablet Take 1,000 mcg by mouth daily.   Yes [provider]  DULoxetine (CYMBALTA) 30 MG capsule Take 1 capsule (30 mg total) by mouth daily. 12/01/20  Yes Ghimire, Henreitta Leber, MD  furosemide (LASIX) 20 MG tablet Take 20 mg by mouth daily.   Yes [provider]  gabapentin (NEURONTIN) 100 MG capsule Take 1 capsule (100 mg total) by mouth at bedtime. 02/04/20  Yes Lyndal Pulley, DO  methocarbamol (ROBAXIN) 500 MG tablet Take 1 tablet (500 mg total) by mouth every 8 (eight) hours as needed for muscle spasms. 11/30/20  Yes Ghimire, Henreitta Leber, MD  Multiple Vitamin (MULTIVITAMIN) tablet Take 1 tablet by mouth daily.   Yes [provider]  Nutritional Supplements (FEEDING SUPPLEMENT, NEPRO CARB STEADY,) LIQD Take 237 mLs by mouth 3 (three) times daily between meals. 11/30/20  Yes Ghimire, Henreitta Leber, MD  ondansetron (ZOFRAN) 4 MG tablet Take 1 tablet (4 mg total) by mouth every 8 (eight) hours as needed for nausea or vomiting. 07/16/18  Yes Aline August, MD  pantoprazole (PROTONIX) 40 MG tablet Take 1 tablet (40 mg total) by mouth daily at 6 (six) AM. 12/01/20  Yes Ghimire, Henreitta Leber, MD  TART CHERRY PO Take 1 tablet by mouth daily.   Yes [provider]  capsicum (ZOSTRIX) 0.075 % topical cream Apply topically 2 (two) times daily. Apply to B/L knees AVOID contact with eyes and broken or irritated skin. 11/30/20   Ghimire, Henreitta Leber, MD  fluticasone (FLONASE) 50 MCG/ACT nasal spray Place 2 sprays into both nostrils daily as needed for allergies. Patient not taking: Reported on 11/21/2020 07/16/18   Aline August,  MD  OXYGEN Inhale 2 L into the lungs as needed (shortness of breath).     [provider]   Allergies  Allergen Reactions   Methylprednisolone Other (See Comments)    Caused severe GERD in October 2018   Minoxidil Palpitations and Other (See Comments)    unable to sleep   Naproxen Swelling   Review of Systems  Constitutional:  Positive for fatigue.   Physical Exam Vitals and nursing note reviewed.  Constitutional:      General: She is not in acute distress.    Appearance: She is ill-appearing.     Interventions: Nasal cannula in place.  Cardiovascular:     Rate and Rhythm: Normal rate.  Pulmonary:     Effort: Pulmonary effort is normal.  Neurological:     Mental Status: She is lethargic.    Vital Signs: BP (!) 110/44 (BP Location: Right Arm)   Pulse 63   Temp 98.8 F (37.1 C) (Oral)   Resp 18   Ht 5\' 6"  (1.676 m)   Wt 85.4 kg   SpO2 99%   BMI 30.39 kg/m  Pain Scale: 0-10   Pain Score: Asleep   SpO2: SpO2: 99 % O2 Device:SpO2: 99 % O2 Flow Rate: .O2 Flow Rate (L/min): 3 L/min  IO: Intake/output summary:  Intake/Output Summary (Last 24 hours) at 12/05/2020 1322 Last data filed at 11/10/2020 2220 Gross per 24 hour  Intake 630 ml  Output --  Net 630 ml    LBM: Last BM Date: 12/03/20 Baseline Weight: Weight: 77.1 kg Most recent weight: Weight: 85.4 kg     Palliative Assessment/Data:     Time In: 12:30pm Time Out: 1:20pm Time Total: 50 minutes Greater than 50% of this time was spent in counseling and coordinating care related to the above assessment and plan.  Dorthy Cooler, PA-C Palliative Medicine Team Team phone # [phone]  Thank you for allowing the Palliative Medicine Team to assist in the care of this patient. Please utilize secure chat with additional questions, if there is no response within 30 minutes please call the above phone number.  Palliative Medicine Team providers are available by phone from 7am to 7pm daily and can be reached through the team cell phone.  Should this patient require  assistance outside of these hours, please call the patient's attending physician.

## 2020-12-05 NOTE — Progress Notes (Addendum)
PROGRESS NOTE    [name]  YBO:[id number] DOB: January 12, 1946 DOA: 11/10/2020 PCP: Hoyt Koch, MD    No chief complaint on file.   Brief Narrative:  75 year old lady prior history of pulmonary hypertension, stage IV CKD, s/p bioprosthetic AVR with AAA repair, chronic diastolic heart failure, chronic respiratory failure on 3 L of nasal cannula oxygen presents with generalized weakness.  Worsening shortness of breath, confusion.  Was recently hospitalized for similar presentation was diagnosed with AKI on stage IV CKD was offered hemodialysis but patient declined all HD.  Chest x-ray showed vascular congestion , .  Labs on admission were pertinent for elevated potassium and creatinine of 6.32.  Patient had goals of care meeting with admitting physician and also with palliative care later today. Patient is adamant about not wanting dialysis understand that she will not survive. Nephrology consulted.   Assessment & Plan:   Active Problems:   AKI (acute kidney injury) ([place])   Acute on chronic diastolic heart failure (HCC)   Goals of care, counseling/discussion   Pressure injury of buttock, unstageable ([place])   Acute on end-stage renal disease with worsening uremia and azotemia with new onset hyperkalemia No improvement in potassium or renal parameters. Lokelma added along with calcium gluconate. Discussed with the patient's husband that patient has poor prognosis and she will not survive without dialysis.. Patient husband is agreeable to continue with lab work at this time.  Palliative care consulted and goals of care discussions going on    Acute metabolic encephalopathy probably secondary to uremia from end-stage renal disease.  Hyperkalemia Continue with Lokelma, calcium gluconate infusion and bicarbonate. Repeat BMP tonight.   Pressure injury  present on admission.   Pressure Injury 11/21/20 Buttocks Right;Lower Stage 3 -  Full thickness tissue loss. Subcutaneous  fat may be visible but bone, tendon or muscle are NOT exposed. healing previous Stage 3 pressure injury (Active)  11/21/20 0800  Location: Buttocks  Location Orientation: Right;Lower  Staging: Stage 3 -  Full thickness tissue loss. Subcutaneous fat may be visible but bone, tendon or muscle are NOT exposed.  Wound Description (Comments): healing previous Stage 3 pressure injury  Present on Admission: Yes     Pressure Injury 11/21/20 Buttocks Right;Upper Unstageable - Full thickness tissue loss in which the base of the injury is covered by slough (yellow, tan, gray, green or brown) and/or eschar (tan, brown or black) in the wound bed. (Active)  11/21/20 0800  Location: Buttocks  Location Orientation: Right;Upper  Staging: Unstageable - Full thickness tissue loss in which the base of the injury is covered by slough (yellow, tan, gray, green or brown) and/or eschar (tan, brown or black) in the wound bed.  Wound Description (Comments):   Present on Admission: Yes     Pressure Injury 12/05/20 Thigh Left;Upper Stage 3 -  Full thickness tissue loss. Subcutaneous fat may be visible but bone, tendon or muscle are NOT exposed. healing stage 3 pressure injury (Active)  12/05/20 1012  Location: Thigh  Location Orientation: Left;Upper  Staging: Stage 3 -  Full thickness tissue loss. Subcutaneous fat may be visible but bone, tendon or muscle are NOT exposed.  Wound Description (Comments): healing stage 3 pressure injury  Present on Admission: Yes   Wound care.     Acute on Chronic diastolic heart failure:  From fluid overload from AKI. Unable to diurese due to borderline low BP.     Chronic respiratory failure with hypoxia from diastolic CHF.  - on 3 lit  of [place] oxygen.    DVT prophylaxis: Subcutaneous heparin Code Status: DNR Family Communication: None at bedside,  called husband and updated of her poor prognosis. Disposition:   Status is: Inpatient  Remains inpatient appropriate  because:Ongoing diagnostic testing needed not appropriate for outpatient work up, Unsafe d/c plan, and Inpatient level of care appropriate due to severity of illness  Dispo: The patient is from: SNF              Anticipated d/c is to:  pending              Patient currently is not medically stable to d/c.   Difficult to place patient No       Consultants:  Nephrology.   Procedures: None.   Antimicrobials: none.    Subjective: Opens eyes briefly, reports feeling okay and doses off.   Objective: Vitals:   12/05/20 0915 12/05/20 0931 12/05/20 1000 12/05/20 1200  BP: (!) 104/51  (!) 104/47 (!) 110/44  Pulse: 65   63  Resp: (!) 21   18  Temp:  97.8 F (36.6 C) (!) 97.3 F (36.3 C) 98.8 F (37.1 C)  TempSrc:  Oral Oral Oral  SpO2: 99%   99%  Weight:   85.4 kg   Height:   5\' 6"  (1.676 m)     Intake/Output Summary (Last 24 hours) at 12/05/2020 1614 Last data filed at 11/23/2020 2220 Gross per 24 hour  Intake 630 ml  Output --  Net 630 ml   Filed Weights   12/01/2020 1534 12/05/20 1000  Weight: 77.1 kg 85.4 kg    Examination:  General exam: ill appearing lady, dyspneic on talking.  Respiratory system: diminished air entry at bases, on 3lit of [place] oxygen.  Cardiovascular system: S1 & S2 heard, RRR. No JVD,   Gastrointestinal system: Abdomen is nondistended, soft and nontender. . Normal bowel sounds heard. Central nervous system: somnolent, slightly confused, knows her name and oriented to person only.  Extremities: no edema.  Skin: No rashes, lesions or ulcers Psychiatry:  unable to assess due to confusion.     Data Reviewed: I have personally reviewed following labs and imaging studies  CBC: Recent Labs  Lab 11/15/2020 1205 12/05/20 0224  WBC 8.9 8.6  NEUTROABS 7.5  --   HGB 6.7* 7.2*  HCT 22.5* 25.2*  MCV 101.4* 101.2*  PLT 168 983    Basic Metabolic Panel: Recent Labs  Lab 11/29/20 0131 11/30/20 0120 12/06/2020 1205 12/05/20 0224  NA 148* 146* 145  146*  K 4.0 4.1 5.5* 5.7*  CL 118* 117* 113* 115*  CO2 21* 21* 20* 18*  GLUCOSE 105* 118* 94 84  BUN 134* 129* 165* 177*  CREATININE 4.00* 4.02* 6.32* 6.68*  CALCIUM 9.0 9.1 8.7* 8.9  PHOS 4.3 4.1  --   --     GFR: Estimated Creatinine Clearance: 8 mL/min (A) (by C-G formula based on SCr of 6.68 mg/dL (H)).  Liver Function Tests: Recent Labs  Lab 11/29/20 0131 11/30/20 0120  ALBUMIN 2.7* 2.6*    CBG: Recent Labs  Lab 11/08/2020 1700  GLUCAP 83     Recent Results (from the past 240 hour(s))  Resp Panel by RT-PCR (Flu A&B, Covid) Nasopharyngeal Swab     Status: None   Collection Time: 11/30/20  8:44 AM   Specimen: Nasopharyngeal Swab; Nasopharyngeal(NP) swabs in vial transport medium  Result Value Ref Range Status   SARS Coronavirus 2 by RT PCR NEGATIVE NEGATIVE Final  Comment: (NOTE) SARS-CoV-2 target nucleic acids are NOT DETECTED.  The SARS-CoV-2 RNA is generally detectable in upper respiratory specimens during the acute phase of infection. The lowest concentration of SARS-CoV-2 viral copies this assay can detect is 138 copies/mL. A negative result does not preclude SARS-Cov-2 infection and should not be used as the sole basis for treatment or other patient management decisions. A negative result may occur with  improper specimen collection/handling, submission of specimen other than nasopharyngeal swab, presence of viral mutation(s) within the areas targeted by this assay, and inadequate number of viral copies(<138 copies/mL). A negative result must be combined with clinical observations, patient history, and epidemiological information. The expected result is Negative.  Fact Sheet for Patients:  EntrepreneurPulse.com.au  Fact Sheet for Healthcare Providers:  IncredibleEmployment.be  This test is no t yet approved or cleared by the [place] FDA and  has been authorized for detection and/or diagnosis of SARS-CoV-2  by FDA under an Emergency Use Authorization (EUA). This EUA will remain  in effect (meaning this test can be used) for the duration of the COVID-19 declaration under Section 564(b)(1) of the Act, 21 U.S.C.section 360bbb-3(b)(1), unless the authorization is terminated  or revoked sooner.       Influenza A by PCR NEGATIVE NEGATIVE Final   Influenza B by PCR NEGATIVE NEGATIVE Final    Comment: (NOTE) The Xpert Xpress SARS-CoV-2/FLU/RSV plus assay is intended as an aid in the diagnosis of influenza from Nasopharyngeal swab specimens and should not be used as a sole basis for treatment. Nasal washings and aspirates are unacceptable for Xpert Xpress SARS-CoV-2/FLU/RSV testing.  Fact Sheet for Patients: EntrepreneurPulse.com.au  Fact Sheet for Healthcare Providers: IncredibleEmployment.be  This test is not yet approved or cleared by the [place] FDA and has been authorized for detection and/or diagnosis of SARS-CoV-2 by FDA under an Emergency Use Authorization (EUA). This EUA will remain in effect (meaning this test can be used) for the duration of the COVID-19 declaration under Section 564(b)(1) of the Act, 21 U.S.C. section 360bbb-3(b)(1), unless the authorization is terminated or revoked.  Performed at [facility], [place] [address] [place], [place] [address]          Radiology Studies: DG Chest 2 View  Result Date: 11/28/2020 CLINICAL DATA:  Shortness of breath EXAM: CHEST - 2 VIEW COMPARISON:  11/20/2020 FINDINGS: Check shadow is enlarged but stable. Postsurgical changes are again seen. Increase in vascular congestion is noted with small effusions bilaterally. Increased left retrocardiac density is noted likely representing atelectasis or early infiltrate. IMPRESSION: Vascular congestion with bilateral effusions and left basilar airspace opacity. Electronically Signed   By: Inez Catalina M.D.   On: 11/30/2020 13:32         Scheduled Meds:  aspirin EC  81 mg Oral Daily   [START ON 01-04-21] calcitRIOL  0.25 mcg Oral Once per day on Mon Fri   calcium gluconate  1 g Intravenous Once   collagenase   Topical Daily   DULoxetine  30 mg Oral Daily   feeding supplement (NEPRO CARB STEADY)  237 mL Oral TID BM   gabapentin  100 mg Oral QHS   heparin  5,000 Units Subcutaneous Q12H   mouth rinse  15 mL Mouth Rinse BID   pantoprazole  40 mg Oral Q0600   sodium bicarbonate  650 mg Oral BID   sodium zirconium cyclosilicate  10 g Oral BID   cyanocobalamin  1,000 mcg Oral Daily   Continuous Infusions:  LOS: 1 day        Hosie Poisson, MD [facility]   To contact the attending provider between 7A-7P or the covering provider during after hours 7P-7A, please log into the web site [email or web address] and access using universal [place] password for that web site. If you do not have the password, please call the hospital operator.  12/05/2020, 4:14 PM    Addendum:  Pt is lethargic, and uremic, borderline BP parameters.  Discussed with the husband about the poor prognosis and she might not survive this hospitalization. She can pass any time. At this time he does not want her to suffer, discussed about BIPAP use and he does not want her to be on it. I asked him about aggressive management with IV medications to maintain BP , her husband said no. He said he "wants her to be comfortable at this time". He also wants to see how she does overnight and take it from there.  Updated the RN.    Hosie Poisson, MD

## 2020-12-05 NOTE — ED Notes (Signed)
Paged admitting about pt BP trending 90/40s for the past three times. MD placed order for pt to receive albumin.

## 2020-12-05 NOTE — Progress Notes (Signed)
Cross-coverage note:   Plan to give albumin for asymptomatic hypotension.

## 2020-12-06 DIAGNOSIS — Z66 Do not resuscitate: Secondary | ICD-10-CM

## 2020-12-06 DIAGNOSIS — D649 Anemia, unspecified: Secondary | ICD-10-CM | POA: Diagnosis not present

## 2020-12-06 DIAGNOSIS — N179 Acute kidney failure, unspecified: Secondary | ICD-10-CM | POA: Diagnosis not present

## 2020-12-06 DIAGNOSIS — I5033 Acute on chronic diastolic (congestive) heart failure: Secondary | ICD-10-CM | POA: Diagnosis not present

## 2020-12-06 DIAGNOSIS — Z7189 Other specified counseling: Secondary | ICD-10-CM | POA: Diagnosis not present

## 2020-12-06 LAB — BASIC METABOLIC PANEL
Anion gap: 13 (ref 5–15)
Anion gap: 14 (ref 5–15)
BUN: 178 mg/dL — ABNORMAL HIGH (ref 8–23)
BUN: 180 mg/dL — ABNORMAL HIGH (ref 8–23)
CO2: 19 mmol/L — ABNORMAL LOW (ref 22–32)
CO2: 20 mmol/L — ABNORMAL LOW (ref 22–32)
Calcium: 8.9 mg/dL (ref 8.9–10.3)
Calcium: 9.1 mg/dL (ref 8.9–10.3)
Chloride: 112 mmol/L — ABNORMAL HIGH (ref 98–111)
Chloride: 113 mmol/L — ABNORMAL HIGH (ref 98–111)
Creatinine, Ser: 7 mg/dL — ABNORMAL HIGH (ref 0.44–1.00)
Creatinine, Ser: 7.18 mg/dL — ABNORMAL HIGH (ref 0.44–1.00)
GFR, Estimated: 6 mL/min — ABNORMAL LOW (ref >60)
GFR, Estimated: 6 mL/min — ABNORMAL LOW (ref >60)
Glucose, Bld: 103 mg/dL — ABNORMAL HIGH (ref 70–99)
Glucose, Bld: 106 mg/dL — ABNORMAL HIGH (ref 70–99)
Potassium: 6 mmol/L — ABNORMAL HIGH (ref 3.5–5.1)
Potassium: 5.5 mmol/L — ABNORMAL HIGH (ref 3.5–5.1)
Sodium: 144 mmol/L (ref 135–145)
Sodium: 147 mmol/L — ABNORMAL HIGH (ref 135–145)

## 2020-12-06 LAB — GLUCOSE, CAPILLARY: Glucose-Capillary: 128 mg/dL — ABNORMAL HIGH (ref 70–99)

## 2020-12-06 MED ORDER — CALCIUM GLUCONATE-NACL 1-0.675 GM/50ML-% IV SOLN
1.0000 g | Freq: Once | INTRAVENOUS | Status: AC
  Administered 2020-12-06: 1000 mg via INTRAVENOUS
  Filled 2020-12-06: qty 50

## 2020-12-06 MED ORDER — LORAZEPAM 2 MG/ML IJ SOLN: 1.0000 mg | INTRAMUSCULAR | Status: DC | PRN

## 2020-12-06 MED ORDER — BIOTENE DRY MOUTH MT LIQD: 15.0000 mL | OROMUCOSAL | Status: DC | PRN

## 2020-12-06 MED ORDER — FENTANYL CITRATE PF 50 MCG/ML IJ SOSY
25.0000 ug | PREFILLED_SYRINGE | INTRAMUSCULAR | Status: DC | PRN
  Administered 2020-12-07 (×2): 25 ug via INTRAVENOUS
  Filled 2020-12-06 (×2): qty 1

## 2020-12-06 MED ORDER — SODIUM BICARBONATE 8.4 % IV SOLN
50.0000 meq | Freq: Once | INTRAVENOUS | Status: AC
  Administered 2020-12-06: 50 meq via INTRAVENOUS
  Filled 2020-12-06: qty 100

## 2020-12-06 MED ORDER — GLYCOPYRROLATE 0.2 MG/ML IJ SOLN
0.3000 mg | INTRAMUSCULAR | Status: DC | PRN
Start: 2020-12-06 — End: 2020-12-07

## 2020-12-06 MED ORDER — DEXTROSE 50 % IV SOLN
1.0000 | Freq: Once | INTRAVENOUS | Status: AC
  Administered 2020-12-06: 50 mL via INTRAVENOUS
  Filled 2020-12-06: qty 50

## 2020-12-06 MED ORDER — INSULIN ASPART 100 UNIT/ML IV SOLN
5.0000 [IU] | Freq: Once | INTRAVENOUS | Status: AC
  Administered 2020-12-06: 5 [IU] via INTRAVENOUS

## 2020-12-06 NOTE — Progress Notes (Signed)
Daily Progress Note   Patient Name: [name]       Date: 12/06/2020 DOB: 01-09-1946  Age: 75 y.o. MRN#: [id number] Attending Physician: Bonnielee Haff, MD Primary Care Physician: Hoyt Koch, MD Admit Date: 12/03/2020  Reason for Consultation/Follow-up: Establishing goals of care  Subjective: Chart Reviewed. Updates Received. Patient Assessed.   Patient is resting. Opens eyes on verbal stimuli. Denies pain but does not say much else. Closes eyes during assessment.   No family at the bedside. I called and spoke to Mr. Skelley providing additional support and updates in addition to attending. Husband verbalized appreciation.   We discussed at length patient's poor prognosis, continued decline for HD, and her quality of life. Husband is emotional expressing he does not wish for her to suffer and he knows focusing on her comfort is most important. Support provided.   I discussed at length what comfort focused care will look like while hospitalized. He verbalized understanding again confirming focus of care is for comfort. I did not approach the discussion regarding possible hospice home placement as he seemed overwhelmed with emotions and being able to express comfort focused care was emotionally taxing for him. We will discuss later regarding disposition options vs anticipated hospital death pending course of symptoms and stability over the next 24 hours.   All questions answered and support provided.  Length of Stay: 2 days  Vital Signs: BP (!) 108/42 (BP Location: Right Arm)   Pulse 67   Temp 97.6 F (36.4 C) (Oral)   Resp 19   Ht 5\' 6"  (1.676 m)   Wt 85.4 kg   SpO2 98%   BMI 30.39 kg/m  SpO2: SpO2: 98 % O2 Device: O2 Device: Nasal Cannula O2 Flow Rate: O2 Flow Rate (L/min): 3 L/min  Physical Exam: Somnolent but easily aroused,  NAD, ill appearing Tachypneic, diminished bilaterally RRR Bilateral lower extremity edema          Palliative Care Assessment & Plan   HPI: Per Colleague: 75 y.o. female  with past medical history of CKD stage IV, chronic diastolic CHF severe hypertrophic cardiomyopathy hypoxia baseline 2 to 3 L, severe AS s/p biosynthetic aortic valve replacement, severe bilateral knee OA, chronic anemia secondary to CKD, gout admitted on 11/16/2020 with increasing shortness of breath, confusion, poor oral intake and nausea.   Patient was hospitalized for similar presentation on 8/14 and discharged to [facility]. She has declined HD. Palliative medicine has been consulted to assist with goals of care conversation.   Code Status: DNR  Goals of Care/Recommendations: All care to focus on comfort Agree with minimizing medications/interventions Fentanyl PRN for pain/air hunger Robinul PRN for excessive secretions Ativan PRN for agitation/anxiety Zofran PRN for nausea Liquifilm tears PRN for dry eyes May have comfort feeding Comfort cart for family Unrestricted visitations in the setting of EOL (per policy) Oxygen PRN 2L or less for comfort. No escalation.   PMT will continue to support and follow.   Prognosis: < 2 weeks  Discharge Planning: To Be Determined  Thank you for allowing the Palliative Medicine Team to assist in the care of this patient.  Time Total: 40 min.   Visit consisted of counseling and education dealing with the complex and emotionally intense issues of symptom management and palliative care in the setting of serious and potentially life-threatening illness.Greater than 50%  of this time was spent counseling and coordinating care related to the above assessment and plan.  Alda Lea, AGPCNP-BC  Palliative Medicine Team [phone]

## 2020-12-06 NOTE — Progress Notes (Signed)
Opyd, MD notified of potassium of 6.0.

## 2020-12-06 NOTE — Progress Notes (Signed)
Sec[facility] chat Nephrology RE: PICC order, per Dr. Arty Baumgartner, PICC is ok if she still does not want dialysis per Dr. Adin Hector note on 11/30/20.

## 2020-12-06 NOTE — Consult Note (Signed)
   [facility] Inpatient Consult   12/06/2020  [name] 12/07/1945 [id number]  [place] [facility] [ACO] Patient: [name]   Patient screened for less than 7 days readmision hospitalization with noted  high risk score for unplanned readmission risk and for post hospital transition of care.  Review of patient's medical record reveals patient is readmitted from a skilled nursing facility.  Patient was being recommended to return to a skilled nursing facility.  Patient is currently under comfort measures.  Plan:  Will follow for appropriateness.  For questions contact:   Natividad Brood, RN BSN [facility] Liaison  [phone] business mobile phone Toll free office [phone]  Fax number: [phone] [email or web address]

## 2020-12-06 NOTE — Evaluation (Signed)
Clinical/Bedside Swallow Evaluation Patient Details  Name: [name] MRN: [id number] Date of Birth: Mar 31, 1946  Today's Date: 12/06/2020 Time: SLP Start Time (ACUTE ONLY): 2595 SLP Stop Time (ACUTE ONLY): 6387 SLP Time Calculation (min) (ACUTE ONLY): 22 min  Past Medical History:  Past Medical History:  Diagnosis Date   Adenomatous colon polyp 01/2002   Anemia, chronic disease    /notes 02/04/2017   Aortic valve disease    AI/AS   Arthritis    "lower back, knees" (02/04/2017)   CHF (congestive heart failure) ([place])    CKD (chronic kidney disease), stage III ([place])    Diverticulosis of colon    DJD (degenerative joint disease)    Frequent UTI    Hemorrhoids    Hypertension    Lumbar back pain    Neuropathy    Obesity    Pericardial effusion     in a patient with Diastolic heart  failure and Pericardial effusion  known since last 2 D echo in 11/2016 /notes 02/04/2017   PONV (postoperative nausea and vomiting)    Pulmonary HTN ([place])    Renal cyst    Venous insufficiency    Vitamin D deficiency    Past Surgical History:  Past Surgical History:  Procedure Laterality Date   ABDOMINAL HYSTERECTOMY     "partial"   AORTIC VALVE REPLACEMENT N/A 05/30/2017   Procedure: AORTIC VALVE REPLACEMENT (AVR);  Surgeon: Prescott Gum, Collier Salina, MD;  Location: [facility] OR;  Service: Vascular;  Laterality: N/A;   COLONOSCOPY W/ BIOPSIES AND POLYPECTOMY     "bx was ok"   IR THORACENTESIS ASP PLEURAL SPACE W/IMG GUIDE  06/11/2017   IR THORACENTESIS ASP PLEURAL SPACE W/IMG GUIDE  06/12/2017   REPAIR OF ACUTE ASCENDING THORACIC AORTIC DISSECTION N/A 05/30/2017   Procedure: REPLACEMENT OF ASCENDING AORTIC ANEURYSM AND REPAIRED CHRONIC ROOT DISSECTION WITH CIRC ARREST;  Surgeon: Ivin Poot, MD;  Location: [place];  Service: Vascular;  Laterality: N/A;   TEE WITHOUT CARDIOVERSION N/A 05/28/2017   Procedure: TRANSESOPHAGEAL ECHOCARDIOGRAM (TEE);  Surgeon: Skeet Latch, MD;  Location: [place];  Service:  Cardiovascular;  Laterality: N/A;   TEE WITHOUT CARDIOVERSION N/A 05/30/2017   Procedure: TRANSESOPHAGEAL ECHOCARDIOGRAM (TEE);  Surgeon: Prescott Gum, Collier Salina, MD;  Location: [place];  Service: Open Heart Surgery;  Laterality: N/A;   HPI:  Pt is a 75 y.o. female who presented with increasing shortness of breath, confusion, poor oral intake and nausea. CXR 8/29: Vascular congestion with bilateral effusions and left basilar airspace opacity. PMT 8/30: "Continue continue interventions for now; patient would never want HD but is also not ready to transition to comfort care/hospice." PMH: CKD stage IV, chronic diastolic CHF severe hypertrophic cardiomyopathy hypoxia baseline 2 to 3 L, severe AS s/p biosynthetic aortic valve replacement, severe bilateral knee OA, chronic anemia secondary to CKD, gout.   Assessment / Plan / Recommendation Clinical Impression  Pt was seen for bedside swallow evaluation and she reported baseline coughing with liquids. Oral mechanism exam was limited due to pt's difficulty following commands; however, oral motor strength and ROM appeared grossly WFL. Dentition was limited. She presented with symptoms of oroharyngeal dysphagia characterized by prolonged mastication, moderate oral residue with advanced solids, and signs of aspiration with thin liquids via straw. Pt was lethargic during the evaluation and the impact of this on her performance is considered, but based on pt's reports, chronic dysphagia is also suspected. A dysphagia 1 diet with nectar thick liquids is recommended at this time. SLP will follow  to assess diet tolerance, for diet advancement as indicated, and for instrumental assessment if pt's symptoms persist. SLP Visit Diagnosis: Dysphagia, unspecified (R13.10)    Aspiration Risk  Mild aspiration risk;Moderate aspiration risk    Diet Recommendation Nectar-thick liquid;Dysphagia 1 (Puree)   Liquid Administration via: Cup;Straw Medication Administration: Crushed with  puree Supervision: Full supervision/cueing for compensatory strategies Compensations: Slow rate;Small sips/bites;Minimize environmental distractions Postural Changes: Seated upright at 90 degrees    Other  Recommendations Oral Care Recommendations: Oral care BID   Follow up Recommendations  (TBD)      Frequency and Duration min 2x/week  2 weeks       Prognosis Prognosis for Safe Diet Advancement: Good Barriers to Reach Goals: Cognitive deficits;Time post onset      Swallow Study   General Date of Onset: 12/05/20 HPI: Pt is a 75 y.o. female who presented with increasing shortness of breath, confusion, poor oral intake and nausea. CXR 8/29: Vascular congestion with bilateral effusions and left basilar airspace opacity. PMT 8/30: "Continue continue interventions for now; patient would never want HD but is also not ready to transition to comfort care/hospice." PMH: CKD stage IV, chronic diastolic CHF severe hypertrophic cardiomyopathy hypoxia baseline 2 to 3 L, severe AS s/p biosynthetic aortic valve replacement, severe bilateral knee OA, chronic anemia secondary to CKD, gout. Type of Study: Bedside Swallow Evaluation Previous Swallow Assessment: none Diet Prior to this Study: Regular;Thin liquids Temperature Spikes Noted: No Respiratory Status: Nasal cannula History of Recent Intubation: No Behavior/Cognition: Pleasant mood;Confused;Requires cueing;Lethargic/Drowsy Oral Cavity Assessment: Within Functional Limits Oral Care Completed by SLP: No Oral Cavity - Dentition: Missing dentition Self-Feeding Abilities: Total assist Patient Positioning: Upright in bed;Postural control interferes with function Baseline Vocal Quality: Low vocal intensity Volitional Cough: Cognitively unable to elicit Volitional Swallow: Unable to elicit    Oral/Motor/Sensory Function Overall Oral Motor/Sensory Function: Within functional limits (but assessment limited)   Ice Chips Ice chips: Within functional  limits Presentation: Spoon   Thin Liquid Thin Liquid: Impaired Presentation: Straw Pharyngeal  Phase Impairments: Cough - Immediate    Nectar Thick Nectar Thick Liquid: Within functional limits Presentation: Straw   Honey Thick Honey Thick Liquid: Not tested   Puree Puree: Within functional limits Presentation: Spoon   Solid     Solid: Impaired Presentation: Spoon Oral Phase Impairments: Poor awareness of bolus;Impaired mastication Oral Phase Functional Implications: Oral residue;Oral holding;Prolonged oral transit;Impaired mastication     Shyleigh Daughtry I. Hardin Negus, [place], [place] Office number [phone] Pager [phone]  Horton Marshall 12/06/2020,9:24 AM

## 2020-12-06 NOTE — Progress Notes (Addendum)
PROGRESS NOTE    [name]  JGG:[id number] DOB: November 16, 1945 DOA: 11/23/2020 PCP: Hoyt Koch, MD    No chief complaint on file.   Brief Narrative:  75 year old lady prior history of pulmonary hypertension, stage IV CKD, s/p bioprosthetic AVR with AAA repair, chronic diastolic heart failure, chronic respiratory failure on 3 L of nasal cannula oxygen presented with generalized weakness, worsening shortness of breath, confusion.  Was recently hospitalized for similar presentation was diagnosed with AKI on stage IV CKD was offered hemodialysis but patient declined all HD.    Assessment & Plan:  Acute kidney injury, likely end-stage/hyperkalemia/metabolic acidosis Patient with significantly elevated BUN as well.  Lokelma was added for hyperkalemia.   Patient remains on bicarbonate.  Oral intake is poor. Prognosis is very poor.  Seen by palliative care.  Patient not willing to transition to full comfort care.  Anticipate patient will continue to decline. Blood work continues to be done.  This was apparently discussed with patient's husband who wanted this to continue.   Will let palliative care have ongoing discussion with family and patient. ADDENDUM Discussed with patient's husband.  He is aware of patient's very poor prognosis.  Main goal is to keep her comfortable.  In view of this I will discontinue all nonessential medications.  We will also discontinue her blood work since it would be futile to do so.  Acute metabolic encephalopathy Secondary to uremia from renal disease.  No focal deficits noted.   Acute on Chronic diastolic heart failure:  From fluid overload from AKI. Unable to diurese due to borderline low BP.  Reticulocyte will not be effective due to significantly elevated creatinine.  What she needs is hemodialysis which she has refused.  Acute on Chronic respiratory failure with hypoxia Remains on oxygen at 3 L/min.  Anemia of chronic disease No evidence for  overt bleeding.  No indication for blood transfusion since it would be futile.  Pressure injury  present on admission.  Pressure Injury 11/21/20 Buttocks Right;Lower Stage 3 -  Full thickness tissue loss. Subcutaneous fat may be visible but bone, tendon or muscle are NOT exposed. healing previous Stage 3 pressure injury (Active)  11/21/20 0800  Location: Buttocks  Location Orientation: Right;Lower  Staging: Stage 3 -  Full thickness tissue loss. Subcutaneous fat may be visible but bone, tendon or muscle are NOT exposed.  Wound Description (Comments): healing previous Stage 3 pressure injury  Present on Admission: Yes     Pressure Injury 11/21/20 Buttocks Right;Upper Unstageable - Full thickness tissue loss in which the base of the injury is covered by slough (yellow, tan, gray, green or brown) and/or eschar (tan, brown or black) in the wound bed. (Active)  11/21/20 0800  Location: Buttocks  Location Orientation: Right;Upper  Staging: Unstageable - Full thickness tissue loss in which the base of the injury is covered by slough (yellow, tan, gray, green or brown) and/or eschar (tan, brown or black) in the wound bed.  Wound Description (Comments):   Present on Admission: Yes     Pressure Injury 12/05/20 Thigh Left;Upper Stage 3 -  Full thickness tissue loss. Subcutaneous fat may be visible but bone, tendon or muscle are NOT exposed. healing stage 3 pressure injury (Active)  12/05/20 1012  Location: Thigh  Location Orientation: Left;Upper  Staging: Stage 3 -  Full thickness tissue loss. Subcutaneous fat may be visible but bone, tendon or muscle are NOT exposed.  Wound Description (Comments): healing stage 3 pressure injury  Present on Admission:  Yes      DVT prophylaxis: Subcutaneous heparin Code Status: DNR Family Communication: We will update husband Disposition:   Status is: Inpatient  Remains inpatient appropriate because:Ongoing diagnostic testing needed not appropriate for  outpatient work up, Unsafe d/c plan, and Inpatient level of care appropriate due to severity of illness  Dispo: The patient is from: SNF              Anticipated d/c is to:  pending              Patient currently is not medically stable to d/c.   Difficult to place patient No       Consultants:  Nephrology.  Palliative care  Procedures: None.   Antimicrobials: none.    Subjective: Eyes are open.  Denies any pain.  Does not communicate much.  Objective: Vitals:   12/06/20 1000 12/06/20 1100 12/06/20 1145 12/06/20 1204  BP: (!) 124/52   (!) 108/42  Pulse: 66 69  69  Resp: 18   15  Temp:   97.6 F (36.4 C)   TempSrc:   Oral   SpO2: 97% 98%  99%  Weight:      Height:        Intake/Output Summary (Last 24 hours) at 12/06/2020 1233 Last data filed at 12/06/2020 0755 Gross per 24 hour  Intake 262.56 ml  Output 100 ml  Net 162.56 ml    Filed Weights   11/13/2020 1534 12/05/20 1000  Weight: 77.1 kg 85.4 kg    Examination:  General appearance: Eyes open.  Minimal communication.  In no distress Resp: Mildly tachypneic.  Diminished air entry at the bases.  Few crackles.  No wheezing Cardio: S1-S2 is normal regular.  No S3-S4.  No rubs murmurs or bruit GI: Abdomen is soft.  Nontender nondistended.  Bowel sounds are present normal.  No masses organomegaly Extremities: Edema noted bilateral lower extremity Neurologic:  No focal neurological deficits.      Data Reviewed: I have personally reviewed following labs and imaging studies  CBC: Recent Labs  Lab 12/03/2020 1205 12/05/20 0224  WBC 8.9 8.6  NEUTROABS 7.5  --   HGB 6.7* 7.2*  HCT 22.5* 25.2*  MCV 101.4* 101.2*  PLT 168 154     Basic Metabolic Panel: Recent Labs  Lab 11/30/20 0120 11/10/2020 1205 12/05/20 0224 12/05/20 1800 12/06/20 0050 12/06/20 0817  NA 146* 145 146* 145 144 147*  K 4.1 5.5* 5.7* 5.9* 6.0* 5.5*  CL 117* 113* 115* 115* 112* 113*  CO2 21* 20* 18* 16* 19* 20*  GLUCOSE 118* 94 84  106* 103* 106*  BUN 129* 165* 177* 177* 178* 180*  CREATININE 4.02* 6.32* 6.68* 6.87* 7.00* 7.18*  CALCIUM 9.1 8.7* 8.9 8.7* 8.9 9.1  PHOS 4.1  --   --   --   --   --      GFR: Estimated Creatinine Clearance: 7.4 mL/min (A) (by C-G formula based on SCr of 7.18 mg/dL (H)).  Liver Function Tests: Recent Labs  Lab 11/30/20 0120  ALBUMIN 2.6*     CBG: Recent Labs  Lab 11/11/2020 1700 12/06/20 0406  GLUCAP 83 128*      Recent Results (from the past 240 hour(s))  Resp Panel by RT-PCR (Flu A&B, Covid) Nasopharyngeal Swab     Status: None   Collection Time: 11/30/20  8:44 AM   Specimen: Nasopharyngeal Swab; Nasopharyngeal(NP) swabs in vial transport medium  Result Value Ref Range Status   SARS Coronavirus  2 by RT PCR NEGATIVE NEGATIVE Final    Comment: (NOTE) SARS-CoV-2 target nucleic acids are NOT DETECTED.  The SARS-CoV-2 RNA is generally detectable in upper respiratory specimens during the acute phase of infection. The lowest concentration of SARS-CoV-2 viral copies this assay can detect is 138 copies/mL. A negative result does not preclude SARS-Cov-2 infection and should not be used as the sole basis for treatment or other patient management decisions. A negative result may occur with  improper specimen collection/handling, submission of specimen other than nasopharyngeal swab, presence of viral mutation(s) within the areas targeted by this assay, and inadequate number of viral copies(<138 copies/mL). A negative result must be combined with clinical observations, patient history, and epidemiological information. The expected result is Negative.  Fact Sheet for Patients:  EntrepreneurPulse.com.au  Fact Sheet for Healthcare Providers:  IncredibleEmployment.be  This test is no t yet approved or cleared by the [place] FDA and  has been authorized for detection and/or diagnosis of SARS-CoV-2 by FDA under an Emergency Use  Authorization (EUA). This EUA will remain  in effect (meaning this test can be used) for the duration of the COVID-19 declaration under Section 564(b)(1) of the Act, 21 U.S.C.section 360bbb-3(b)(1), unless the authorization is terminated  or revoked sooner.       Influenza A by PCR NEGATIVE NEGATIVE Final   Influenza B by PCR NEGATIVE NEGATIVE Final    Comment: (NOTE) The Xpert Xpress SARS-CoV-2/FLU/RSV plus assay is intended as an aid in the diagnosis of influenza from Nasopharyngeal swab specimens and should not be used as a sole basis for treatment. Nasal washings and aspirates are unacceptable for Xpert Xpress SARS-CoV-2/FLU/RSV testing.  Fact Sheet for Patients: EntrepreneurPulse.com.au  Fact Sheet for Healthcare Providers: IncredibleEmployment.be  This test is not yet approved or cleared by the [place] FDA and has been authorized for detection and/or diagnosis of SARS-CoV-2 by FDA under an Emergency Use Authorization (EUA). This EUA will remain in effect (meaning this test can be used) for the duration of the COVID-19 declaration under Section 564(b)(1) of the Act, 21 U.S.C. section 360bbb-3(b)(1), unless the authorization is terminated or revoked.  Performed at [facility], [place] [address], [place], [place] [address]   MRSA Next Gen by PCR, Nasal     Status: None   Collection Time: 12/05/20  3:33 PM   Specimen: Nasal Mucosa; Nasal Swab  Result Value Ref Range Status   MRSA by PCR Next Gen NOT DETECTED NOT DETECTED Final    Comment: (NOTE) The GeneXpert MRSA Assay (FDA approved for NASAL specimens only), is one component of a comprehensive MRSA colonization surveillance program. It is not intended to diagnose MRSA infection nor to guide or monitor treatment for MRSA infections. Test performance is not FDA approved in patients less than 67 years old. Performed at [facility], [place] [address] [place],  [place] [address]           Radiology Studies: DG Chest 2 View  Result Date: 12/05/2020 CLINICAL DATA:  Shortness of breath EXAM: CHEST - 2 VIEW COMPARISON:  11/20/2020 FINDINGS: Check shadow is enlarged but stable. Postsurgical changes are again seen. Increase in vascular congestion is noted with small effusions bilaterally. Increased left retrocardiac density is noted likely representing atelectasis or early infiltrate. IMPRESSION: Vascular congestion with bilateral effusions and left basilar airspace opacity. Electronically Signed   By: Inez Catalina M.D.   On: 11/19/2020 13:32   [place] EKG SITE RITE  Result Date: 12/05/2020 If  Site Rite image not attached, placement could not be confirmed due to current cardiac rhythm.       Scheduled Meds:  aspirin EC  81 mg Oral Daily   [START ON 14-Dec-2020] calcitRIOL  0.25 mcg Oral Once per day on Mon Fri   collagenase   Topical Daily   DULoxetine  30 mg Oral Daily   feeding supplement (NEPRO CARB STEADY)  237 mL Oral TID BM   gabapentin  100 mg Oral QHS   heparin  5,000 Units Subcutaneous Q12H   mouth rinse  15 mL Mouth Rinse BID   pantoprazole  40 mg Oral Q0600   sodium bicarbonate  650 mg Oral BID   sodium zirconium cyclosilicate  10 g Oral BID   cyanocobalamin  1,000 mcg Oral Daily   Continuous Infusions:   LOS: 2 days     Bonnielee Haff, MD [facility]   To contact the attending provider between 7A-7P or the covering provider during after hours 7P-7A, please log into the web site [email or web address] and access using universal [place] password for that web site. If you do not have the password, please call the hospital operator.  12/06/2020, 12:33 PM

## 2020-12-06 NOTE — Progress Notes (Signed)
PT is lethargic still refusing dialysis. Repositioned on side after attempt of medication administration. MD notified by nurse, PT unable to successfully take PO medications.

## 2020-12-07 DIAGNOSIS — Z515 Encounter for palliative care: Secondary | ICD-10-CM | POA: Diagnosis not present

## 2020-12-07 DIAGNOSIS — N179 Acute kidney failure, unspecified: Secondary | ICD-10-CM | POA: Diagnosis not present

## 2020-12-07 DIAGNOSIS — I5033 Acute on chronic diastolic (congestive) heart failure: Secondary | ICD-10-CM | POA: Diagnosis not present

## 2020-12-07 MED ORDER — GLYCOPYRROLATE 0.2 MG/ML IJ SOLN
0.3000 mg | INTRAMUSCULAR | Status: DC
  Administered 2020-12-07 – 2020-12-08 (×4): 0.3 mg via INTRAVENOUS
  Filled 2020-12-07 (×5): qty 2

## 2020-12-07 MED ORDER — FENTANYL CITRATE PF 50 MCG/ML IJ SOSY
25.0000 ug | PREFILLED_SYRINGE | INTRAMUSCULAR | Status: DC
  Administered 2020-12-07 – 2020-12-08 (×5): 25 ug via INTRAVENOUS
  Filled 2020-12-07 (×6): qty 1

## 2020-12-07 NOTE — Progress Notes (Signed)
Patent PROGRESS NOTE    [name]  PRF:[id number] DOB: December 29, 1945 DOA: 11/13/2020 PCP: Hoyt Koch, MD    No chief complaint on file.   Brief Narrative:  75 year old lady prior history of pulmonary hypertension, stage IV CKD, s/p bioprosthetic AVR with AAA repair, chronic diastolic heart failure, chronic respiratory failure on 3 L of nasal cannula oxygen presented with generalized weakness, worsening shortness of breath, confusion.  Was recently hospitalized for similar presentation was diagnosed with AKI on stage IV CKD was offered hemodialysis but patient declined HD.     Assessment & Plan:  Acute kidney injury, likely end-stage/hyperkalemia/metabolic acidosis Patient presented with worsening renal function and hyperkalemia.  Patient had previously declined hemodialysis.  Prognosis was thought to be very poor.  After multiple discussions with patient and her husband she was transitioned to comfort care.  Patient's vital signs noted to be stable though she is more lethargic today.  Still anticipate in-hospital death however if she is remained stable for the next 24 to 48 hours we could consider residential hospice.    Acute metabolic encephalopathy Secondary to uremia from renal disease.    Acute on Chronic diastolic heart failure:  From fluid overload from AKI.  Prognosis is poor  Acute on Chronic respiratory failure with hypoxia Rate of fluid overload.  Remains on oxygen via nasal cannula.  Anemia of chronic disease   Pressure injury  present on admission.  Pressure Injury 11/21/20 Buttocks Right;Lower Stage 3 -  Full thickness tissue loss. Subcutaneous fat may be visible but bone, tendon or muscle are NOT exposed. healing previous Stage 3 pressure injury (Active)  11/21/20 0800  Location: Buttocks  Location Orientation: Right;Lower  Staging: Stage 3 -  Full thickness tissue loss. Subcutaneous fat may be visible but bone, tendon or muscle are NOT exposed.  Wound  Description (Comments): healing previous Stage 3 pressure injury  Present on Admission: Yes     Pressure Injury 11/21/20 Buttocks Right;Upper Unstageable - Full thickness tissue loss in which the base of the injury is covered by slough (yellow, tan, gray, green or brown) and/or eschar (tan, brown or black) in the wound bed. (Active)  11/21/20 0800  Location: Buttocks  Location Orientation: Right;Upper  Staging: Unstageable - Full thickness tissue loss in which the base of the injury is covered by slough (yellow, tan, gray, green or brown) and/or eschar (tan, brown or black) in the wound bed.  Wound Description (Comments):   Present on Admission: Yes     Pressure Injury 12/05/20 Thigh Left;Upper Stage 3 -  Full thickness tissue loss. Subcutaneous fat may be visible but bone, tendon or muscle are NOT exposed. healing stage 3 pressure injury (Active)  12/05/20 1012  Location: Thigh  Location Orientation: Left;Upper  Staging: Stage 3 -  Full thickness tissue loss. Subcutaneous fat may be visible but bone, tendon or muscle are NOT exposed.  Wound Description (Comments): healing stage 3 pressure injury  Present on Admission: Yes      DVT prophylaxis: Subcutaneous heparin Code Status: DNR Family Communication: We will update husband Disposition:   Status is: Inpatient  Remains inpatient appropriate because:Ongoing diagnostic testing needed not appropriate for outpatient work up, Unsafe d/c plan, and Inpatient level of care appropriate due to severity of illness  Dispo: The patient is from: SNF              Anticipated d/c is to:  pending              Patient  currently is not medically stable to d/c.   Difficult to place patient No       Consultants:  Nephrology.  Palliative care  Procedures: None.   Antimicrobials: none.    Subjective: Eyes are open.  Patient is mumbling something but I am unable to understand.  Noted to be lethargic.  Objective: Vitals:   12/06/20 1204  12/06/20 1300 12/06/20 2018 12/07/20 0350  BP: (!) 108/42  135/63   Pulse: 69 67 90 92  Resp: 15 19 20 20   Temp:   97.6 F (36.4 C) 98.2 F (36.8 C)  TempSrc:   Oral Oral  SpO2: 99% 98% 94% 93%  Weight:      Height:       No intake or output data in the 24 hours ending 12/07/20 1101  Filed Weights   12/01/2020 1534 12/05/20 1000  Weight: 77.1 kg 85.4 kg    Examination:  Patient noted to be more lethargic today compared to yesterday Shallow respirations noted.  Tachypnea noted.  Crackles bilaterally. S1-S2 is normal regular Edema lower extremities. Abdomen soft. No focal Neurological deficits     Data Reviewed: I have personally reviewed following labs and imaging studies  CBC: Recent Labs  Lab 11/09/2020 1205 12/05/20 0224  WBC 8.9 8.6  NEUTROABS 7.5  --   HGB 6.7* 7.2*  HCT 22.5* 25.2*  MCV 101.4* 101.2*  PLT 168 154     Basic Metabolic Panel: Recent Labs  Lab 11/29/2020 1205 12/05/20 0224 12/05/20 1800 12/06/20 0050 12/06/20 0817  NA 145 146* 145 144 147*  K 5.5* 5.7* 5.9* 6.0* 5.5*  CL 113* 115* 115* 112* 113*  CO2 20* 18* 16* 19* 20*  GLUCOSE 94 84 106* 103* 106*  BUN 165* 177* 177* 178* 180*  CREATININE 6.32* 6.68* 6.87* 7.00* 7.18*  CALCIUM 8.7* 8.9 8.7* 8.9 9.1     CBG: Recent Labs  Lab 11/06/2020 1700 12/06/20 0406  GLUCAP 83 128*      Recent Results (from the past 240 hour(s))  Resp Panel by RT-PCR (Flu A&B, Covid) Nasopharyngeal Swab     Status: None   Collection Time: 11/30/20  8:44 AM   Specimen: Nasopharyngeal Swab; Nasopharyngeal(NP) swabs in vial transport medium  Result Value Ref Range Status   SARS Coronavirus 2 by RT PCR NEGATIVE NEGATIVE Final    Comment: (NOTE) SARS-CoV-2 target nucleic acids are NOT DETECTED.  The SARS-CoV-2 RNA is generally detectable in upper respiratory specimens during the acute phase of infection. The lowest concentration of SARS-CoV-2 viral copies this assay can detect is 138 copies/mL. A  negative result does not preclude SARS-Cov-2 infection and should not be used as the sole basis for treatment or other patient management decisions. A negative result may occur with  improper specimen collection/handling, submission of specimen other than nasopharyngeal swab, presence of viral mutation(s) within the areas targeted by this assay, and inadequate number of viral copies(<138 copies/mL). A negative result must be combined with clinical observations, patient history, and epidemiological information. The expected result is Negative.  Fact Sheet for Patients:  EntrepreneurPulse.com.au  Fact Sheet for Healthcare Providers:  IncredibleEmployment.be  This test is no t yet approved or cleared by the [place] FDA and  has been authorized for detection and/or diagnosis of SARS-CoV-2 by FDA under an Emergency Use Authorization (EUA). This EUA will remain  in effect (meaning this test can be used) for the duration of the COVID-19 declaration under Section 564(b)(1) of the Act, 21 U.S.C.section 360bbb-3(b)(1),  unless the authorization is terminated  or revoked sooner.       Influenza A by PCR NEGATIVE NEGATIVE Final   Influenza B by PCR NEGATIVE NEGATIVE Final    Comment: (NOTE) The Xpert Xpress SARS-CoV-2/FLU/RSV plus assay is intended as an aid in the diagnosis of influenza from Nasopharyngeal swab specimens and should not be used as a sole basis for treatment. Nasal washings and aspirates are unacceptable for Xpert Xpress SARS-CoV-2/FLU/RSV testing.  Fact Sheet for Patients: EntrepreneurPulse.com.au  Fact Sheet for Healthcare Providers: IncredibleEmployment.be  This test is not yet approved or cleared by the [place] FDA and has been authorized for detection and/or diagnosis of SARS-CoV-2 by FDA under an Emergency Use Authorization (EUA). This EUA will remain in effect (meaning this test can  be used) for the duration of the COVID-19 declaration under Section 564(b)(1) of the Act, 21 U.S.C. section 360bbb-3(b)(1), unless the authorization is terminated or revoked.  Performed at [facility], [place] [address], [place], [place] [address]   MRSA Next Gen by PCR, Nasal     Status: None   Collection Time: 12/05/20  3:33 PM   Specimen: Nasal Mucosa; Nasal Swab  Result Value Ref Range Status   MRSA by PCR Next Gen NOT DETECTED NOT DETECTED Final    Comment: (NOTE) The GeneXpert MRSA Assay (FDA approved for NASAL specimens only), is one component of a comprehensive MRSA colonization surveillance program. It is not intended to diagnose MRSA infection nor to guide or monitor treatment for MRSA infections. Test performance is not FDA approved in patients less than 4 years old. Performed at [facility], [place] [address], [place], [place] [address]         Scheduled Meds:  collagenase   Topical Daily   mouth rinse  15 mL Mouth Rinse BID   pantoprazole  40 mg Oral Q0600   sodium zirconium cyclosilicate  10 g Oral BID   Continuous Infusions:   LOS: 3 days     Bonnielee Haff, MD [facility]   To contact the attending provider between 7A-7P or the covering provider during after hours 7P-7A, please log into the web site [email or web address] and access using universal [place] password for that web site. If you do not have the password, please call the hospital operator.  12/07/2020, 11:01 AM

## 2020-12-07 NOTE — Progress Notes (Addendum)
   Daily Progress Note   Patient Name: [name]       Date: 12/07/2020 DOB: 1945-11-25  Age: 75 y.o. MRN#: [id number] Attending Physician: Martha Haff, MD Primary Care Physician: Martha Koch, MD Admit Date: 11/24/2020  Reason for Consultation/Follow-up: Establishing goals of care  Subjective: Chart Reviewed. Patient Assessed. She remains very lethargic, states "I'm ok" and falls back asleep. Excessive secretions heard and also somewhat dyspneic. No family present at the bedside.   I then called patient's husband [name] to provide support and updates. Explained rationale for scheduling Robinul and low-dose Fentanyl for excessive secretions and dyspnea. He agrees with this plan. Revisited our initial discussion of residential hospice as an option if patient remains stable over the next 24 hours. Reviewed [facility] as a facility that provides 24/7 expert care at end of life and [name] states he would be willing to proceed with referral tomorrow.  Questions and concerns addressed. PMT will continue to support holistically.  Length of Stay: 3 days  Vital Signs: BP 135/63 (BP Location: Right Arm)   Pulse 92   Temp 98.2 F (36.8 C) (Oral)   Resp 20   Ht 5\' 6"  (1.676 m)   Wt 85.4 kg   SpO2 93%   BMI 30.39 kg/m  SpO2: SpO2: 93 % O2 Device: O2 Device: Nasal Cannula O2 Flow Rate: O2 Flow Rate (L/min): 3 L/min  Physical Exam: Somnolent but easily aroused,  NAD, ill appearing Tachypneic, diminished bilaterally RRR Bilateral lower extremity edema          Palliative Care Assessment & Plan  HPI: Per Colleague: 75 y.o. female  with past medical history of CKD stage IV, chronic diastolic CHF severe hypertrophic cardiomyopathy hypoxia baseline 2 to 3 L, severe AS s/p biosynthetic aortic valve replacement, severe bilateral knee OA, chronic anemia secondary to CKD, gout admitted on 12/05/2020 with increasing shortness of breath, confusion, poor oral intake and nausea.    Patient was hospitalized for similar presentation on 8/14 and discharged to [facility]. She has declined HD. Palliative medicine has been consulted to assist with goals of care conversation.   Code Status: DNR  Goals of Care/Recommendations: Continue comfort care, PRN medications per MAR Scheduled Fentanyl Q3H for dyspnea  Scheduled Robinul Q4H for excessive secretions PMT will continue to support and follow. Patient's husband agrees to proceed with residential hospice referral if patient remains stable tomorrow  Prognosis: < 2 weeks  Discharge Planning: To Be Determined  Time Total: 25 min.  Greater than 50% of this time was spent in counseling and coordinating care related to the above assessment and plan.  Martha Cooler, PA-C Palliative Medicine Team Team phone # [phone]  Thank you for allowing the Palliative Medicine Team to assist in the care of this patient. Please utilize secure chat with additional questions, if there is no response within 30 minutes please call the above phone number.  Palliative Medicine Team providers are available by phone from 7am to 7pm daily and can be reached through the team cell phone.  Should this patient require assistance outside of these hours, please call the patient's attending physician.

## 2020-12-07 NOTE — Care Management Important Message (Signed)
Important Message  Patient Details  Name: [name] MRN: [id number] Date of Birth: [birth year]   Medicare Important Message Given:  Yes     Orbie Pyo 12/07/2020, 2:07 PM

## 2020-12-07 NOTE — Progress Notes (Signed)
SLP Cancellation Note  Patient Details Name: [name] MRN: [id number] DOB: 09/12/1945   Cancelled treatment:       Reason Eval/Treat Not Completed: Other (comment) (Pt has been changed to comfort measures. SLP will s/o)  Tobie Poet I. Hardin Negus, [place], [place] Office number [phone] Pager [phone]  Horton Marshall 12/07/2020, 10:22 AM

## 2020-12-07 DEATH — deceased

## 2021-01-02 ENCOUNTER — Telehealth: Payer: Medicare Other

## 2021-01-06 NOTE — Death Summary Note (Signed)
DEATH SUMMARY   Patient Details  Name: [name] MRN: [id number] DOB: [birth year]  Admission/Discharge Information   Admit Date:  2020-12-28  Date of Death: Date of Death: Jan 01, 2021  Time of Death: Time of Death: 0625  Length of Stay: 4  Referring Physician: Hoyt Koch, MD   Reason(s) for Hospitalization  Acute kidney injury on chronic kidney disease stage IV  Diagnoses  Preliminary cause of death: End stage renal disease ([place]) Secondary Diagnoses (including complications and co-morbidities):  Active Problems: Acute metabolic encephalopathy Acute on chronic diastolic CHF Anemia of chronic disease   Brief Hospital Course (including significant findings, care, treatment, and services provided and events leading to death)   Brief HPI:  75 year old lady prior history of pulmonary hypertension, stage IV CKD, s/p bioprosthetic AVR with AAA repair, chronic diastolic heart failure, chronic respiratory failure on 3 L of nasal cannula oxygen presented with generalized weakness, worsening shortness of breath, confusion.  Was recently hospitalized for similar presentation was diagnosed with AKI on stage IV CKD was offered hemodialysis but patient declined HD.   Hospital course: Acute kidney injury, likely end-stage/hyperkalemia/metabolic acidosis Patient presented with worsening renal function and hyperkalemia.  Patient had previously declined hemodialysis.  Prognosis was thought to be very poor.  After multiple discussions with patient and her husband she was transitioned to comfort care.  Patient subsequently expired on 01/01/21 at 6:25 AM.   Acute metabolic encephalopathy Secondary to uremia from renal disease.     Acute on Chronic diastolic heart failure:  From fluid overload from AKI.  Prognosis is poor   Acute on Chronic respiratory failure with hypoxia Rate of fluid overload.  Remains on oxygen via nasal cannula.   Anemia of chronic disease     Pressure injury   present on admission.  Pressure Injury 11/21/20 Buttocks Right;Lower Stage 3 -  Full thickness tissue loss. Subcutaneous fat may be visible but bone, tendon or muscle are NOT exposed. healing previous Stage 3 pressure injury (Active)  11/21/20 0800  Location: Buttocks  Location Orientation: Right;Lower  Staging: Stage 3 -  Full thickness tissue loss. Subcutaneous fat may be visible but bone, tendon or muscle are NOT exposed.  Wound Description (Comments): healing previous Stage 3 pressure injury  Present on Admission: Yes     Pressure Injury 11/21/20 Buttocks Right;Upper Unstageable - Full thickness tissue loss in which the base of the injury is covered by slough (yellow, tan, gray, green or brown) and/or eschar (tan, brown or black) in the wound bed. (Active)  11/21/20 0800  Location: Buttocks  Location Orientation: Right;Upper  Staging: Unstageable - Full thickness tissue loss in which the base of the injury is covered by slough (yellow, tan, gray, green or brown) and/or eschar (tan, brown or black) in the wound bed.  Wound Description (Comments):   Present on Admission: Yes     Pressure Injury 12/05/20 Thigh Left;Upper Stage 3 -  Full thickness tissue loss. Subcutaneous fat may be visible but bone, tendon or muscle are NOT exposed. healing stage 3 pressure injury (Active)  12/05/20 1012  Location: Thigh  Location Orientation: Left;Upper  Staging: Stage 3 -  Full thickness tissue loss. Subcutaneous fat may be visible but bone, tendon or muscle are NOT exposed.  Wound Description (Comments): healing stage 3 pressure injury  Present on Admission: Yes    Obesity  Estimated body mass index is 30.39 kg/m as calculated from the following:   Height as of this encounter: 5\' 6"  (1.676 m).  Weight as of this encounter: 85.4 kg.       Pertinent Labs and Studies  Significant Diagnostic Studies DG Chest 2 View  Result Date: 12/05/2020 CLINICAL DATA:  Shortness of breath EXAM: CHEST - 2  VIEW COMPARISON:  11/20/2020 FINDINGS: Check shadow is enlarged but stable. Postsurgical changes are again seen. Increase in vascular congestion is noted with small effusions bilaterally. Increased left retrocardiac density is noted likely representing atelectasis or early infiltrate. IMPRESSION: Vascular congestion with bilateral effusions and left basilar airspace opacity. Electronically Signed   By: Inez Catalina M.D.   On: 11/30/2020 13:32   DG Knee 1-2 Views Left  Result Date: 11/19/2020 CLINICAL DATA:  Pain. EXAM: LEFT KNEE - 1-2 VIEW COMPARISON:  10/22/2017. FINDINGS: Advanced tricompartmental degenerative osteoarthritic changes of the LEFT knee, with significant joint space loss and degenerative osteophyte formation. These degenerative changes are not significantly changed compared to the previous plain film of 10/22/2017. No fracture line or displaced fracture fragment. No acute-appearing cortical irregularity or osseous lesion. Probable joint effusion within the suprapatellar bursa. Superficial soft tissues about the LEFT knee are unremarkable. IMPRESSION: 1. Advanced degenerative osteoarthritic changes of the LEFT knee, not significantly changed compared to the previous plain film of 10/22/2017. 2. No acute findings. Electronically Signed   By: Franki Cabot M.D.   On: 11/19/2020 18:14   DG Knee 1-2 Views Right  Result Date: 11/19/2020 CLINICAL DATA:  Pain. EXAM: RIGHT KNEE - 1-2 VIEW COMPARISON:  Plain film of the RIGHT knee dated 10/22/2017. FINDINGS: Advanced degenerative osteoarthritic changes of the RIGHT knee, tricompartmental but most severe at the medial compartment, with associated joint space narrowing and osteophyte formation. No fracture line or displaced fracture fragment. No acute-appearing cortical irregularity or osseous lesion. Probable small joint effusion within the suprapatellar bursa, likely chronic. Superficial soft tissues about the RIGHT knee are unremarkable. IMPRESSION: 1.  Advanced degenerative osteoarthritic changes of the RIGHT knee, tricompartmental but most severe at the medial compartment, not significantly changed compared to previous plain film of 10/22/2017. 2. Probable small joint effusion, likely chronic. 3. No acute findings. Electronically Signed   By: Franki Cabot M.D.   On: 11/19/2020 18:16   US RENAL  Result Date: 11/19/2020 CLINICAL DATA:  Acute kidney injury. EXAM: RENAL / URINARY TRACT ULTRASOUND COMPLETE COMPARISON:  Noncontrast CT 05/28/2017 FINDINGS: Right Kidney: Renal measurements: 10.5 x 4.3 x 3.9 cm = volume: 92 mL. No hydronephrosis. Increased renal parenchymal echogenicity. 1 cm cyst in the anterior superior kidney. No visualized stone or solid lesion. Left Kidney: Renal measurements: 8.1 x 3.9 x 4.9 cm = volume: 81 mL. No hydronephrosis. Diffusely increased renal parenchymal echogenicity. Exophytic cyst arising from the lower kidney measures 5.9 x 4.9 x 5.4 cm. No visualized stone or solid lesion. Bladder: Appears normal for degree of bladder distention. Both ureteral jets are seen. Other: None. IMPRESSION: 1. No hydronephrosis or obstructive uropathy. 2. Increased renal parenchymal echogenicity typical of chronic medical renal disease. 3. Bilateral renal cysts. Electronically Signed   By: Keith Rake M.D.   On: 11/19/2020 20:00   DG Chest Port 1 View  Result Date: 11/20/2020 CLINICAL DATA:  Shortness of breath. EXAM: PORTABLE CHEST 1 VIEW COMPARISON:  Chest x-ray dated March 08, 2020. FINDINGS: Stable cardiomegaly status post AVR. Chronic mild pulmonary vascular congestion. No focal consolidation, pleural effusion, or pneumothorax. No acute osseous abnormality. IMPRESSION: 1. Chronic mild pulmonary vascular congestion. Electronically Signed   By: Titus Dubin M.D.   On: 11/20/2020 07:31  DG Abd Portable 1V  Result Date: 11/21/2020 CLINICAL DATA:  Diarrhea. EXAM: PORTABLE ABDOMEN - 1 VIEW COMPARISON:  05/31/2017 FINDINGS: The lung  bases are not included. The bowel gas pattern is unremarkable. No findings for obstruction. The soft tissue shadows are grossly maintained. No worrisome calcifications. The bony structures are unremarkable. IMPRESSION: Unremarkable abdominal radiographs. Electronically Signed   By: Marijo Sanes M.D.   On: 11/21/2020 14:49   [place] EKG SITE RITE  Result Date: 12/05/2020 If Site Rite image not attached, placement could not be confirmed due to current cardiac rhythm.   Microbiology Recent Results (from the past 240 hour(s))  Resp Panel by RT-PCR (Flu A&B, Covid) Nasopharyngeal Swab     Status: None   Collection Time: 11/30/20  8:44 AM   Specimen: Nasopharyngeal Swab; Nasopharyngeal(NP) swabs in vial transport medium  Result Value Ref Range Status   SARS Coronavirus 2 by RT PCR NEGATIVE NEGATIVE Final    Comment: (NOTE) SARS-CoV-2 target nucleic acids are NOT DETECTED.  The SARS-CoV-2 RNA is generally detectable in upper respiratory specimens during the acute phase of infection. The lowest concentration of SARS-CoV-2 viral copies this assay can detect is 138 copies/mL. A negative result does not preclude SARS-Cov-2 infection and should not be used as the sole basis for treatment or other patient management decisions. A negative result may occur with  improper specimen collection/handling, submission of specimen other than nasopharyngeal swab, presence of viral mutation(s) within the areas targeted by this assay, and inadequate number of viral copies(<138 copies/mL). A negative result must be combined with clinical observations, patient history, and epidemiological information. The expected result is Negative.  Fact Sheet for Patients:  EntrepreneurPulse.com.au  Fact Sheet for Healthcare Providers:  IncredibleEmployment.be  This test is no t yet approved or cleared by the [place] FDA and  has been authorized for detection and/or diagnosis of  SARS-CoV-2 by FDA under an Emergency Use Authorization (EUA). This EUA will remain  in effect (meaning this test can be used) for the duration of the COVID-19 declaration under Section 564(b)(1) of the Act, 21 U.S.C.section 360bbb-3(b)(1), unless the authorization is terminated  or revoked sooner.       Influenza A by PCR NEGATIVE NEGATIVE Final   Influenza B by PCR NEGATIVE NEGATIVE Final    Comment: (NOTE) The Xpert Xpress SARS-CoV-2/FLU/RSV plus assay is intended as an aid in the diagnosis of influenza from Nasopharyngeal swab specimens and should not be used as a sole basis for treatment. Nasal washings and aspirates are unacceptable for Xpert Xpress SARS-CoV-2/FLU/RSV testing.  Fact Sheet for Patients: EntrepreneurPulse.com.au  Fact Sheet for Healthcare Providers: IncredibleEmployment.be  This test is not yet approved or cleared by the [place] FDA and has been authorized for detection and/or diagnosis of SARS-CoV-2 by FDA under an Emergency Use Authorization (EUA). This EUA will remain in effect (meaning this test can be used) for the duration of the COVID-19 declaration under Section 564(b)(1) of the Act, 21 U.S.C. section 360bbb-3(b)(1), unless the authorization is terminated or revoked.  Performed at [facility], [place] [address], [place], [place] [address]   MRSA Next Gen by PCR, Nasal     Status: None   Collection Time: 12/05/20  3:33 PM   Specimen: Nasal Mucosa; Nasal Swab  Result Value Ref Range Status   MRSA by PCR Next Gen NOT DETECTED NOT DETECTED Final    Comment: (NOTE) The GeneXpert MRSA Assay (FDA approved for NASAL specimens only), is one component of a  comprehensive MRSA colonization surveillance program. It is not intended to diagnose MRSA infection nor to guide or monitor treatment for MRSA infections. Test performance is not FDA approved in patients less than 38 years old. Performed at [facility], [place] [address], [place], [place] [address]     Lab Basic Metabolic Panel: Recent Labs  Lab 11/10/2020 1205 12/05/20 0224 12/05/20 1800 12/06/20 0050 12/06/20 0817  NA 145 146* 145 144 147*  K 5.5* 5.7* 5.9* 6.0* 5.5*  CL 113* 115* 115* 112* 113*  CO2 20* 18* 16* 19* 20*  GLUCOSE 94 84 106* 103* 106*  BUN 165* 177* 177* 178* 180*  CREATININE 6.32* 6.68* 6.87* 7.00* 7.18*  CALCIUM 8.7* 8.9 8.7* 8.9 9.1    CBC: Recent Labs  Lab 11/08/2020 1205 12/05/20 0224  WBC 8.9 8.6  NEUTROABS 7.5  --   HGB 6.7* 7.2*  HCT 22.5* 25.2*  MCV 101.4* 101.2*  PLT 168 154   Sepsis Labs: Recent Labs  Lab 11/24/2020 1205 12/05/20 0224  WBC 8.9 8.6       [place]ong[place] Ponder 2021/01/03, 9:39 AM

## 2021-01-06 DEATH — deceased

## 2021-01-24 ENCOUNTER — Ambulatory Visit: Payer: Medicare Other | Admitting: Medical

## 2021-06-28 ENCOUNTER — Ambulatory Visit: Payer: Medicare Other
